# Patient Record
Sex: Male | Born: 1945
Health system: Southern US, Community
[De-identification: ages and names within clinical notes are randomized; demographics above are authoritative.]

## PROBLEM LIST (undated history)

## (undated) ENCOUNTER — Emergency Department (HOSPITAL_COMMUNITY): Discharge status: Absent without leave | Payer: Medicare Other

## (undated) DIAGNOSIS — M545 Low back pain, unspecified: Secondary | ICD-10-CM

## (undated) DIAGNOSIS — J45909 Unspecified asthma, uncomplicated: Secondary | ICD-10-CM

## (undated) DIAGNOSIS — G4730 Sleep apnea, unspecified: Secondary | ICD-10-CM

## (undated) DIAGNOSIS — Z720 Tobacco use: Secondary | ICD-10-CM

## (undated) DIAGNOSIS — I214 Non-ST elevation (NSTEMI) myocardial infarction: Secondary | ICD-10-CM

## (undated) DIAGNOSIS — Z9889 Other specified postprocedural states: Secondary | ICD-10-CM

## (undated) DIAGNOSIS — K5190 Ulcerative colitis, unspecified, without complications: Secondary | ICD-10-CM

## (undated) DIAGNOSIS — G459 Transient cerebral ischemic attack, unspecified: Secondary | ICD-10-CM

## (undated) DIAGNOSIS — E7800 Pure hypercholesterolemia, unspecified: Secondary | ICD-10-CM

## (undated) DIAGNOSIS — J449 Chronic obstructive pulmonary disease, unspecified: Secondary | ICD-10-CM

## (undated) DIAGNOSIS — K219 Gastro-esophageal reflux disease without esophagitis: Secondary | ICD-10-CM

## (undated) DIAGNOSIS — I639 Cerebral infarction, unspecified: Secondary | ICD-10-CM

## (undated) DIAGNOSIS — I5032 Chronic diastolic (congestive) heart failure: Secondary | ICD-10-CM

## (undated) DIAGNOSIS — G8929 Other chronic pain: Secondary | ICD-10-CM

## (undated) DIAGNOSIS — F329 Major depressive disorder, single episode, unspecified: Secondary | ICD-10-CM

## (undated) DIAGNOSIS — F32A Depression, unspecified: Secondary | ICD-10-CM

## (undated) DIAGNOSIS — I10 Essential (primary) hypertension: Secondary | ICD-10-CM

## (undated) DIAGNOSIS — F419 Anxiety disorder, unspecified: Secondary | ICD-10-CM

## (undated) DIAGNOSIS — I2510 Atherosclerotic heart disease of native coronary artery without angina pectoris: Secondary | ICD-10-CM

## (undated) DIAGNOSIS — E119 Type 2 diabetes mellitus without complications: Secondary | ICD-10-CM

## (undated) DIAGNOSIS — I480 Paroxysmal atrial fibrillation: Secondary | ICD-10-CM

## (undated) HISTORY — PX: CHOLECYSTECTOMY: SHX55

## (undated) HISTORY — DX: Chronic diastolic (congestive) heart failure: I50.32

## (undated) HISTORY — PX: CARDIAC CATHETERIZATION: SHX172

## (undated) HISTORY — DX: Other specified postprocedural states: Z98.890

## (undated) HISTORY — DX: Paroxysmal atrial fibrillation: I48.0

## (undated) HISTORY — PX: APPENDECTOMY: SHX54

## (undated) HISTORY — PX: TUMOR EXCISION: SHX421

## (undated) HISTORY — DX: Tobacco use: Z72.0

---
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
## 1998-01-09 ENCOUNTER — Inpatient Hospital Stay (HOSPITAL_COMMUNITY): Admission: AD | Admit: 1998-01-09 | Discharge: 1998-01-10 | Payer: Self-pay | Admitting: Cardiology

## 2001-05-28 ENCOUNTER — Encounter: Admission: RE | Admit: 2001-05-28 | Discharge: 2001-05-28 | Payer: Self-pay | Admitting: Neurosurgery

## 2001-05-28 ENCOUNTER — Encounter: Payer: Self-pay | Admitting: Neurosurgery

## 2002-12-08 ENCOUNTER — Encounter
Admission: RE | Admit: 2002-12-08 | Discharge: 2003-03-08 | Payer: Self-pay | Admitting: Physical Medicine & Rehabilitation

## 2003-01-02 ENCOUNTER — Encounter: Payer: Self-pay | Admitting: Physical Medicine & Rehabilitation

## 2003-01-02 ENCOUNTER — Encounter
Admission: RE | Admit: 2003-01-02 | Discharge: 2003-01-02 | Payer: Self-pay | Admitting: Physical Medicine & Rehabilitation

## 2004-04-15 ENCOUNTER — Ambulatory Visit: Payer: Self-pay | Admitting: Urgent Care

## 2005-02-26 ENCOUNTER — Ambulatory Visit: Payer: Self-pay | Admitting: Internal Medicine

## 2005-03-14 ENCOUNTER — Ambulatory Visit: Payer: Self-pay | Admitting: Internal Medicine

## 2005-03-14 ENCOUNTER — Ambulatory Visit (HOSPITAL_COMMUNITY): Admission: RE | Admit: 2005-03-14 | Discharge: 2005-03-14 | Payer: Self-pay | Admitting: Internal Medicine

## 2005-04-16 ENCOUNTER — Ambulatory Visit: Payer: Self-pay | Admitting: Cardiology

## 2009-06-17 ENCOUNTER — Ambulatory Visit: Payer: Self-pay | Admitting: Cardiology

## 2009-06-19 ENCOUNTER — Encounter: Payer: Self-pay | Admitting: Cardiovascular Disease

## 2011-12-29 DIAGNOSIS — R079 Chest pain, unspecified: Secondary | ICD-10-CM

## 2013-08-25 ENCOUNTER — Encounter (INDEPENDENT_AMBULATORY_CARE_PROVIDER_SITE_OTHER): Payer: Self-pay | Admitting: *Deleted

## 2013-09-15 ENCOUNTER — Ambulatory Visit (INDEPENDENT_AMBULATORY_CARE_PROVIDER_SITE_OTHER): Payer: Medicare Other | Admitting: Internal Medicine

## 2013-10-12 ENCOUNTER — Ambulatory Visit (INDEPENDENT_AMBULATORY_CARE_PROVIDER_SITE_OTHER): Payer: Medicare Other | Admitting: Internal Medicine

## 2014-04-21 ENCOUNTER — Encounter: Payer: Self-pay | Admitting: Cardiovascular Disease

## 2014-05-08 ENCOUNTER — Encounter: Payer: Self-pay | Admitting: Cardiovascular Disease

## 2014-05-09 ENCOUNTER — Encounter: Payer: Self-pay | Admitting: Cardiovascular Disease

## 2014-05-09 DIAGNOSIS — I214 Non-ST elevation (NSTEMI) myocardial infarction: Secondary | ICD-10-CM

## 2014-05-09 HISTORY — PX: CORONARY ANGIOPLASTY WITH STENT PLACEMENT: SHX49

## 2014-05-09 HISTORY — DX: Non-ST elevation (NSTEMI) myocardial infarction: I21.4

## 2014-05-11 DIAGNOSIS — I10 Essential (primary) hypertension: Secondary | ICD-10-CM | POA: Insufficient documentation

## 2014-05-11 DIAGNOSIS — E669 Obesity, unspecified: Secondary | ICD-10-CM | POA: Insufficient documentation

## 2014-05-11 DIAGNOSIS — J410 Simple chronic bronchitis: Secondary | ICD-10-CM | POA: Insufficient documentation

## 2014-05-11 DIAGNOSIS — E1169 Type 2 diabetes mellitus with other specified complication: Secondary | ICD-10-CM | POA: Insufficient documentation

## 2014-06-08 ENCOUNTER — Encounter: Payer: Medicare Other | Admitting: Cardiovascular Disease

## 2014-06-15 ENCOUNTER — Encounter (HOSPITAL_COMMUNITY): Payer: Self-pay

## 2014-06-22 ENCOUNTER — Inpatient Hospital Stay (HOSPITAL_COMMUNITY): Payer: Medicare Other

## 2014-06-22 ENCOUNTER — Emergency Department (HOSPITAL_COMMUNITY): Payer: Medicare Other

## 2014-06-22 ENCOUNTER — Encounter (HOSPITAL_COMMUNITY): Payer: Self-pay | Admitting: Emergency Medicine

## 2014-06-22 ENCOUNTER — Inpatient Hospital Stay (HOSPITAL_COMMUNITY)
Admission: EM | Admit: 2014-06-22 | Discharge: 2014-06-24 | DRG: 871 | Disposition: A | Discharge status: Home / Self Care | Payer: Medicare Other | Attending: Internal Medicine | Admitting: Internal Medicine

## 2014-06-22 DIAGNOSIS — R0789 Other chest pain: Secondary | ICD-10-CM | POA: Diagnosis present

## 2014-06-22 DIAGNOSIS — F419 Anxiety disorder, unspecified: Secondary | ICD-10-CM | POA: Diagnosis present

## 2014-06-22 DIAGNOSIS — J45909 Unspecified asthma, uncomplicated: Secondary | ICD-10-CM | POA: Diagnosis present

## 2014-06-22 DIAGNOSIS — G9340 Encephalopathy, unspecified: Secondary | ICD-10-CM | POA: Diagnosis present

## 2014-06-22 DIAGNOSIS — I2510 Atherosclerotic heart disease of native coronary artery without angina pectoris: Secondary | ICD-10-CM | POA: Diagnosis not present

## 2014-06-22 DIAGNOSIS — Z7902 Long term (current) use of antithrombotics/antiplatelets: Secondary | ICD-10-CM | POA: Diagnosis not present

## 2014-06-22 DIAGNOSIS — J449 Chronic obstructive pulmonary disease, unspecified: Secondary | ICD-10-CM | POA: Diagnosis present

## 2014-06-22 DIAGNOSIS — G8929 Other chronic pain: Secondary | ICD-10-CM | POA: Diagnosis present

## 2014-06-22 DIAGNOSIS — E876 Hypokalemia: Secondary | ICD-10-CM | POA: Diagnosis present

## 2014-06-22 DIAGNOSIS — R05 Cough: Secondary | ICD-10-CM

## 2014-06-22 DIAGNOSIS — R079 Chest pain, unspecified: Secondary | ICD-10-CM | POA: Diagnosis present

## 2014-06-22 DIAGNOSIS — J189 Pneumonia, unspecified organism: Secondary | ICD-10-CM | POA: Diagnosis present

## 2014-06-22 DIAGNOSIS — A419 Sepsis, unspecified organism: Secondary | ICD-10-CM | POA: Diagnosis present

## 2014-06-22 DIAGNOSIS — Z8673 Personal history of transient ischemic attack (TIA), and cerebral infarction without residual deficits: Secondary | ICD-10-CM

## 2014-06-22 DIAGNOSIS — I10 Essential (primary) hypertension: Secondary | ICD-10-CM | POA: Diagnosis present

## 2014-06-22 DIAGNOSIS — E669 Obesity, unspecified: Secondary | ICD-10-CM | POA: Diagnosis present

## 2014-06-22 DIAGNOSIS — E785 Hyperlipidemia, unspecified: Secondary | ICD-10-CM | POA: Diagnosis present

## 2014-06-22 DIAGNOSIS — R059 Cough, unspecified: Secondary | ICD-10-CM

## 2014-06-22 DIAGNOSIS — E119 Type 2 diabetes mellitus without complications: Secondary | ICD-10-CM | POA: Diagnosis present

## 2014-06-22 DIAGNOSIS — I252 Old myocardial infarction: Secondary | ICD-10-CM | POA: Diagnosis not present

## 2014-06-22 DIAGNOSIS — F329 Major depressive disorder, single episode, unspecified: Secondary | ICD-10-CM | POA: Diagnosis present

## 2014-06-22 DIAGNOSIS — Z7982 Long term (current) use of aspirin: Secondary | ICD-10-CM | POA: Diagnosis not present

## 2014-06-22 DIAGNOSIS — M549 Dorsalgia, unspecified: Secondary | ICD-10-CM | POA: Diagnosis present

## 2014-06-22 DIAGNOSIS — M542 Cervicalgia: Secondary | ICD-10-CM

## 2014-06-22 DIAGNOSIS — F17210 Nicotine dependence, cigarettes, uncomplicated: Secondary | ICD-10-CM | POA: Diagnosis present

## 2014-06-22 DIAGNOSIS — Z9049 Acquired absence of other specified parts of digestive tract: Secondary | ICD-10-CM | POA: Diagnosis present

## 2014-06-22 DIAGNOSIS — J441 Chronic obstructive pulmonary disease with (acute) exacerbation: Secondary | ICD-10-CM | POA: Diagnosis present

## 2014-06-22 DIAGNOSIS — D72829 Elevated white blood cell count, unspecified: Secondary | ICD-10-CM | POA: Diagnosis present

## 2014-06-22 DIAGNOSIS — Z720 Tobacco use: Secondary | ICD-10-CM | POA: Diagnosis present

## 2014-06-22 DIAGNOSIS — K5190 Ulcerative colitis, unspecified, without complications: Secondary | ICD-10-CM | POA: Diagnosis present

## 2014-06-22 DIAGNOSIS — K219 Gastro-esophageal reflux disease without esophagitis: Secondary | ICD-10-CM | POA: Diagnosis present

## 2014-06-22 DIAGNOSIS — J410 Simple chronic bronchitis: Secondary | ICD-10-CM

## 2014-06-22 DIAGNOSIS — J439 Emphysema, unspecified: Secondary | ICD-10-CM | POA: Diagnosis present

## 2014-06-22 DIAGNOSIS — R509 Fever, unspecified: Secondary | ICD-10-CM

## 2014-06-22 HISTORY — DX: Gastro-esophageal reflux disease without esophagitis: K21.9

## 2014-06-22 HISTORY — DX: Low back pain: M54.5

## 2014-06-22 HISTORY — DX: Low back pain, unspecified: M54.50

## 2014-06-22 HISTORY — DX: Atherosclerotic heart disease of native coronary artery without angina pectoris: I25.10

## 2014-06-22 HISTORY — DX: Transient cerebral ischemic attack, unspecified: G45.9

## 2014-06-22 HISTORY — DX: Unspecified asthma, uncomplicated: J45.909

## 2014-06-22 HISTORY — DX: Non-ST elevation (NSTEMI) myocardial infarction: I21.4

## 2014-06-22 HISTORY — DX: Ulcerative colitis, unspecified, without complications: K51.90

## 2014-06-22 HISTORY — DX: Chronic obstructive pulmonary disease, unspecified: J44.9

## 2014-06-22 HISTORY — DX: Essential (primary) hypertension: I10

## 2014-06-22 HISTORY — DX: Depression, unspecified: F32.A

## 2014-06-22 HISTORY — DX: Pure hypercholesterolemia, unspecified: E78.00

## 2014-06-22 HISTORY — DX: Anxiety disorder, unspecified: F41.9

## 2014-06-22 HISTORY — DX: Other chronic pain: G89.29

## 2014-06-22 HISTORY — DX: Type 2 diabetes mellitus without complications: E11.9

## 2014-06-22 HISTORY — DX: Major depressive disorder, single episode, unspecified: F32.9

## 2014-06-22 LAB — RAPID URINE DRUG SCREEN, HOSP PERFORMED
Amphetamines: NOT DETECTED
BENZODIAZEPINES: POSITIVE — AB
Barbiturates: NOT DETECTED
COCAINE: NOT DETECTED
Opiates: POSITIVE — AB
Tetrahydrocannabinol: NOT DETECTED

## 2014-06-22 LAB — BASIC METABOLIC PANEL
Anion gap: 9 (ref 5–15)
BUN: 17 mg/dL (ref 6–23)
CO2: 29 mmol/L (ref 19–32)
CREATININE: 1.33 mg/dL (ref 0.50–1.35)
Calcium: 8.5 mg/dL (ref 8.4–10.5)
Chloride: 101 mEq/L (ref 96–112)
GFR, EST AFRICAN AMERICAN: 63 mL/min — AB (ref >90)
GFR, EST NON AFRICAN AMERICAN: 54 mL/min — AB (ref >90)
Glucose, Bld: 120 mg/dL — ABNORMAL HIGH (ref 70–99)
Potassium: 4.4 mmol/L (ref 3.5–5.1)
SODIUM: 139 mmol/L (ref 135–145)

## 2014-06-22 LAB — I-STAT ARTERIAL BLOOD GAS, ED
BICARBONATE: 27.2 meq/L — AB (ref 20.0–24.0)
O2 Saturation: 85 %
Patient temperature: 98.7
TCO2: 29 mmol/L (ref 0–100)
pCO2 arterial: 54.9 mmHg — ABNORMAL HIGH (ref 35.0–45.0)
pH, Arterial: 7.303 — ABNORMAL LOW (ref 7.350–7.450)
pO2, Arterial: 57 mmHg — ABNORMAL LOW (ref 80.0–100.0)

## 2014-06-22 LAB — URINALYSIS, ROUTINE W REFLEX MICROSCOPIC
GLUCOSE, UA: NEGATIVE mg/dL
HGB URINE DIPSTICK: NEGATIVE
KETONES UR: 15 mg/dL — AB
Leukocytes, UA: NEGATIVE
NITRITE: NEGATIVE
PROTEIN: NEGATIVE mg/dL
Specific Gravity, Urine: 1.026 (ref 1.005–1.030)
Urobilinogen, UA: 0.2 mg/dL (ref 0.0–1.0)
pH: 5 (ref 5.0–8.0)

## 2014-06-22 LAB — CBC
HEMATOCRIT: 42.3 % (ref 39.0–52.0)
Hemoglobin: 14.2 g/dL (ref 13.0–17.0)
MCH: 29.2 pg (ref 26.0–34.0)
MCHC: 33.6 g/dL (ref 30.0–36.0)
MCV: 87 fL (ref 78.0–100.0)
Platelets: 283 10*3/uL (ref 150–400)
RBC: 4.86 MIL/uL (ref 4.22–5.81)
RDW: 13.4 % (ref 11.5–15.5)
WBC: 20.6 10*3/uL — ABNORMAL HIGH (ref 4.0–10.5)

## 2014-06-22 LAB — HEPATIC FUNCTION PANEL
ALT: 22 U/L (ref 0–53)
AST: 21 U/L (ref 0–37)
Albumin: 3.8 g/dL (ref 3.5–5.2)
Alkaline Phosphatase: 79 U/L (ref 39–117)
Bilirubin, Direct: <0.1 mg/dL (ref 0.0–0.3)
TOTAL PROTEIN: 7.7 g/dL (ref 6.0–8.3)
Total Bilirubin: 0.7 mg/dL (ref 0.3–1.2)

## 2014-06-22 LAB — BRAIN NATRIURETIC PEPTIDE: B Natriuretic Peptide: 67.2 pg/mL (ref 0.0–100.0)

## 2014-06-22 LAB — TROPONIN I: Troponin I: 0.03 ng/mL (ref <0.031)

## 2014-06-22 LAB — AMMONIA: AMMONIA: 31 umol/L (ref 11–32)

## 2014-06-22 LAB — GLUCOSE, CAPILLARY: Glucose-Capillary: 106 mg/dL — ABNORMAL HIGH (ref 70–99)

## 2014-06-22 MED ORDER — INSULIN ASPART 100 UNIT/ML ~~LOC~~ SOLN
0.0000 [IU] | Freq: Three times a day (TID) | SUBCUTANEOUS | Status: DC
  Administered 2014-06-23: 3 [IU] via SUBCUTANEOUS
  Administered 2014-06-23: 2 [IU] via SUBCUTANEOUS
  Administered 2014-06-23: 3 [IU] via SUBCUTANEOUS

## 2014-06-22 MED ORDER — IPRATROPIUM-ALBUTEROL 0.5-2.5 (3) MG/3ML IN SOLN
3.0000 mL | Freq: Once | RESPIRATORY_TRACT | Status: AC
  Administered 2014-06-22: 3 mL via RESPIRATORY_TRACT
  Filled 2014-06-22: qty 3

## 2014-06-22 MED ORDER — PIPERACILLIN-TAZOBACTAM 3.375 G IVPB
3.3750 g | Freq: Three times a day (TID) | INTRAVENOUS | Status: DC
  Filled 2014-06-22 (×2): qty 50

## 2014-06-22 MED ORDER — CLOPIDOGREL BISULFATE 75 MG PO TABS
75.0000 mg | ORAL_TABLET | Freq: Every day | ORAL | Status: DC
  Administered 2014-06-23 – 2014-06-24 (×2): 75 mg via ORAL
  Filled 2014-06-22 (×2): qty 1

## 2014-06-22 MED ORDER — VANCOMYCIN HCL 10 G IV SOLR
1500.0000 mg | Freq: Once | INTRAVENOUS | Status: AC
  Administered 2014-06-23: 1500 mg via INTRAVENOUS
  Filled 2014-06-22: qty 1500

## 2014-06-22 MED ORDER — SODIUM CHLORIDE 0.9 % IJ SOLN
3.0000 mL | Freq: Two times a day (BID) | INTRAMUSCULAR | Status: DC
  Administered 2014-06-22 – 2014-06-23 (×2): 3 mL via INTRAVENOUS

## 2014-06-22 MED ORDER — ONDANSETRON HCL 4 MG PO TABS: 4.0000 mg | ORAL_TABLET | Freq: Four times a day (QID) | ORAL | Status: DC | PRN

## 2014-06-22 MED ORDER — ACETAMINOPHEN 325 MG PO TABS: 650.0000 mg | ORAL_TABLET | Freq: Four times a day (QID) | ORAL | Status: DC | PRN

## 2014-06-22 MED ORDER — ENOXAPARIN SODIUM 40 MG/0.4ML ~~LOC~~ SOLN
40.0000 mg | SUBCUTANEOUS | Status: DC
  Administered 2014-06-22 – 2014-06-23 (×2): 40 mg via SUBCUTANEOUS
  Filled 2014-06-22 (×2): qty 0.4

## 2014-06-22 MED ORDER — ONDANSETRON HCL 4 MG/2ML IJ SOLN: 4.0000 mg | Freq: Four times a day (QID) | INTRAMUSCULAR | Status: DC | PRN

## 2014-06-22 MED ORDER — ALBUTEROL SULFATE HFA 108 (90 BASE) MCG/ACT IN AERS: 2.0000 | INHALATION_SPRAY | Freq: Four times a day (QID) | RESPIRATORY_TRACT | Status: DC | PRN

## 2014-06-22 MED ORDER — LORAZEPAM 0.5 MG PO TABS
0.5000 mg | ORAL_TABLET | Freq: Once | ORAL | Status: AC
  Administered 2014-06-23: 0.5 mg via ORAL
  Filled 2014-06-22: qty 1

## 2014-06-22 MED ORDER — ISOSORBIDE MONONITRATE ER 30 MG PO TB24
30.0000 mg | ORAL_TABLET | Freq: Every day | ORAL | Status: DC
  Administered 2014-06-23 – 2014-06-24 (×2): 30 mg via ORAL
  Filled 2014-06-22 (×2): qty 1

## 2014-06-22 MED ORDER — PANTOPRAZOLE SODIUM 40 MG PO TBEC
40.0000 mg | DELAYED_RELEASE_TABLET | Freq: Every day | ORAL | Status: DC
  Administered 2014-06-23 – 2014-06-24 (×2): 40 mg via ORAL
  Filled 2014-06-22 (×2): qty 1

## 2014-06-22 MED ORDER — PIPERACILLIN-TAZOBACTAM 3.375 G IVPB 30 MIN
3.3750 g | Freq: Once | INTRAVENOUS | Status: AC
  Administered 2014-06-22: 3.375 g via INTRAVENOUS
  Filled 2014-06-22: qty 50

## 2014-06-22 MED ORDER — ACETAMINOPHEN 650 MG RE SUPP: 650.0000 mg | Freq: Four times a day (QID) | RECTAL | Status: DC | PRN

## 2014-06-22 MED ORDER — VANCOMYCIN HCL IN DEXTROSE 750-5 MG/150ML-% IV SOLN
750.0000 mg | Freq: Two times a day (BID) | INTRAVENOUS | Status: DC
  Administered 2014-06-23: 750 mg via INTRAVENOUS
  Filled 2014-06-22 (×3): qty 150

## 2014-06-22 MED ORDER — PRAVASTATIN SODIUM 20 MG PO TABS: 20.0000 mg | ORAL_TABLET | Freq: Every day | ORAL | Status: DC

## 2014-06-22 MED ORDER — CARVEDILOL 25 MG PO TABS
25.0000 mg | ORAL_TABLET | Freq: Two times a day (BID) | ORAL | Status: DC
  Administered 2014-06-22 – 2014-06-24 (×4): 25 mg via ORAL
  Filled 2014-06-22 (×4): qty 1

## 2014-06-22 MED ORDER — ALBUTEROL SULFATE (2.5 MG/3ML) 0.083% IN NEBU: 2.5000 mg | INHALATION_SOLUTION | Freq: Four times a day (QID) | RESPIRATORY_TRACT | Status: DC | PRN

## 2014-06-22 MED ORDER — BENZONATATE 100 MG PO CAPS: 100.0000 mg | ORAL_CAPSULE | Freq: Three times a day (TID) | ORAL | Status: DC | PRN

## 2014-06-22 MED ORDER — NITROGLYCERIN 0.4 MG SL SUBL: 0.4000 mg | SUBLINGUAL_TABLET | SUBLINGUAL | Status: DC | PRN

## 2014-06-22 MED ORDER — AMLODIPINE BESYLATE 10 MG PO TABS: 10.0000 mg | ORAL_TABLET | Freq: Every day | ORAL | Status: DC

## 2014-06-22 MED ORDER — PIPERACILLIN-TAZOBACTAM 3.375 G IVPB
3.3750 g | Freq: Three times a day (TID) | INTRAVENOUS | Status: DC
  Administered 2014-06-23 (×2): 3.375 g via INTRAVENOUS
  Filled 2014-06-22 (×5): qty 50

## 2014-06-22 MED ORDER — NICOTINE 21 MG/24HR TD PT24
21.0000 mg | MEDICATED_PATCH | Freq: Every day | TRANSDERMAL | Status: DC
  Administered 2014-06-23: 21 mg via TRANSDERMAL
  Filled 2014-06-22 (×3): qty 1

## 2014-06-22 MED ORDER — INSULIN ASPART 100 UNIT/ML ~~LOC~~ SOLN: 0.0000 [IU] | Freq: Every day | SUBCUTANEOUS | Status: DC

## 2014-06-22 MED ORDER — SULFASALAZINE 500 MG PO TABS
500.0000 mg | ORAL_TABLET | Freq: Three times a day (TID) | ORAL | Status: DC
  Administered 2014-06-22 – 2014-06-23 (×4): 500 mg via ORAL
  Filled 2014-06-22 (×7): qty 1

## 2014-06-22 MED ORDER — POTASSIUM CHLORIDE ER 10 MEQ PO TBCR
10.0000 meq | EXTENDED_RELEASE_TABLET | Freq: Every day | ORAL | Status: DC
  Administered 2014-06-23 – 2014-06-24 (×2): 10 meq via ORAL
  Filled 2014-06-22 (×4): qty 1

## 2014-06-22 NOTE — Progress Notes (Signed)
ANTIBIOTIC CONSULT NOTE - INITIAL  Pharmacy Consult for Vancomycin and Zosyn Indication: rule out sepsis  No Known Allergies  Patient Measurements: Height: 5\' 9"  (175.3 cm) Weight: 210 lb (95.255 kg) IBW/kg (Calculated) : 70.7  Vital Signs: BP: 108/55 mmHg (01/14 1845) Pulse Rate: 64 (01/14 1845) Intake/Output from previous day:   Intake/Output from this shift:    Labs:  Recent Labs  06/22/14 1520  WBC 20.6*  HGB 14.2  PLT 283  CREATININE 1.33   Estimated Creatinine Clearance: 62.2 mL/min (by C-G formula based on Cr of 1.33). No results for input(s): VANCOTROUGH, VANCOPEAK, VANCORANDOM, GENTTROUGH, GENTPEAK, GENTRANDOM, TOBRATROUGH, TOBRAPEAK, TOBRARND, AMIKACINPEAK, AMIKACINTROU, AMIKACIN in the last 72 hours.   Microbiology: No results found for this or any previous visit (from the past 720 hour(s)).  Medical History: Past Medical History  Diagnosis Date  . Myocardial infarction     with stent placement  . Ulcerative colitis   . Diabetes mellitus without complication   . Hypertension   . Asthma   . COPD (chronic obstructive pulmonary disease)    Assessment: 6 YOM presenting to [facility] on 06/22/14 c/o CP, hypotension, and dizziness.  Patient also has stiffness in the neck x 3 days and tightness of the chest.  CXR reveals edema.  Pharmacy has been consulted to dose Vancomycin and Zosyn in the setting to rule out sepsis.    WBC up 20.6, afebrile, HR 63, RR 18.   Scr 1.33, Crcl ~ 55-60 mL/min, NKDA.    Goal of Therapy:  Vancomycin trough level 15-20 mcg/ml  Plan:  - Vancomycin 1500 IV x 1 - Vancomycin 750 mg IV Q12H - Zosyn 3.375 g IV x 1 (over 30 min.) - Zosyn 3.375 g IV Q8H - Monitor for clinical efficacy and trough levels when appropriate - F/U cultures  Hassie Bruce, Pharm. D. Clinical Pharmacy Resident Pager: [phone] Ph: [phone] 06/22/2014 7:24 PM

## 2014-06-22 NOTE — ED Provider Notes (Signed)
69 year old male status post coronary stenting has been having neck pain for last 3 days. Today, he had some chest discomfort and sweating and also noted that his vision was blurred and he was dizzy. Most of the symptoms have resolved except for the neck discomfort. On exam, pupils are equal and reactive and fundi show no hemorrhage, exudate, or papilledema. Neck has some muscle spasm and is mildly tender. Lungs are clear and heart has regular rate and rhythm. There are no carotid bruits. Chest images have 2+ edema. Speech is slow and thought processes are scattered in the family states that this is not his baseline. He will likely need evaluation for possible transient ischemic attack.  I saw and evaluated the patient, reviewed the resident's note and I agree with the findings and plan.   EKG Interpretation   Date/Time:  Thursday June 22 2014 14:54:24 EST Ventricular Rate:  73 PR Interval:  160 QRS Duration: 98 QT Interval:  409 QTC Calculation: 451 R Axis:   82 Text Interpretation:  Sinus rhythm Borderline right axis deviation  Probable LVH with secondary repol abnrm No old tracing to compare  Confirmed by [facility]  MD, Sarah Zerby ([id number]) on 06/22/2014 3:40:12 PM        Delora Fuel, MD 72/25/75 0518

## 2014-06-22 NOTE — ED Provider Notes (Signed)
CSN: [id number]     Arrival date & time 06/22/14  1439 History   First MD Initiated Contact with Patient 06/22/14 1457     Chief Complaint  Patient presents with  . Chest Pain  . Dizziness     (Consider location/radiation/quality/duration/timing/severity/associated sxs/prior Treatment) Patient is a 69 y.o. male presenting with chest pain and dizziness. The history is provided by the patient. No language interpreter was used.  Chest Pain Pain location:  Substernal area Pain quality: pressure   Pain radiates to:  Does not radiate Pain radiates to the back: no   Pain severity:  Moderate Onset quality:  Gradual Duration:  2 days Timing:  Constant Progression:  Unchanged Chronicity:  New Context: not breathing and not lifting   Relieved by:  Nothing Worsened by:  Nothing tried Ineffective treatments:  None tried Associated symptoms: cough, dizziness and shortness of breath   Associated symptoms: no abdominal pain, no anorexia, no back pain, no fever, no nausea, no orthopnea, not vomiting and no weakness   Risk factors: coronary artery disease and male sex   Risk factors: no aortic disease and no birth control   Dizziness Associated symptoms: chest pain and shortness of breath   Associated symptoms: no nausea and no vomiting      69 y/o Caucasian male with PMH of COPD, DM, and MI with stents placed at [facility] 1 month ago here with cough, shortness of breath, and chest pressure for the past couple of weeks.  Patient's main complaint is lethargy and shortness of breath.  He has felt this way for approximately two weeks.  He reports a non-productive cough which has become progressively worse with worsening shortness of breath.  Today he felt so tired and lethargic that he was unable to get out of bed.  He denies fevers, but has been experiencing chills.  Patient also reports precordial chest pressure similar to his previous MI which has been present for the past 2 days and getting  progressively worse.  He cannot think of anything that improves or worsens his chest pressure.  He rates it at a 4/10.    Past Medical History  Diagnosis Date  . Myocardial infarction     with stent placement  . Ulcerative colitis   . Hypertension   . Asthma   . COPD (chronic obstructive pulmonary disease)   . GERD (gastroesophageal reflux disease)   . Type II diabetes mellitus   . Coronary artery disease    Past Surgical History  Procedure Laterality Date  . Coronary angioplasty with stent placement      "2"  . Appendectomy    . Cholecystectomy    . Tumor excision Right ~ 1999    "side of my upper head"   No family history on file. History  Substance Use Topics  . Smoking status: Current Every Day Smoker -- 1.50 packs/day    Types: Cigarettes  . Smokeless tobacco: Not on file  . Alcohol Use: No    Review of Systems  Constitutional: Negative for fever.  Respiratory: Positive for cough and shortness of breath.   Cardiovascular: Positive for chest pain. Negative for orthopnea.  Gastrointestinal: Negative for nausea, vomiting, abdominal pain and anorexia.  Musculoskeletal: Negative for back pain.  Neurological: Positive for dizziness. Negative for weakness.  All other systems reviewed and are negative.     Allergies  Review of patient's allergies indicates no known allergies.  Home Medications   Prior to Admission medications   Medication Sig  Start Date End Date Taking? Authorizing Provider  albuterol (PROVENTIL HFA;VENTOLIN HFA) 108 (90 BASE) MCG/ACT inhaler Inhale 2 puffs into the lungs every 6 (six) hours as needed for wheezing or shortness of breath.   Yes Historical Provider, MD  amLODipine (NORVASC) 10 MG tablet Take 10 mg by mouth daily.   Yes Historical Provider, MD  benzonatate (TESSALON) 100 MG capsule Take 100 mg by mouth 3 (three) times daily as needed for cough.   Yes Historical Provider, MD  carvedilol (COREG) 25 MG tablet Take 25 mg by mouth 2 (two)  times daily with a meal.   Yes Historical Provider, MD  clopidogrel (PLAVIX) 75 MG tablet Take 75 mg by mouth daily.   Yes Historical Provider, MD  glipiZIDE (GLUCOTROL XL) 2.5 MG 24 hr tablet Take 2.5 mg by mouth daily with breakfast.   Yes Historical Provider, MD  hydrochlorothiazide (HYDRODIURIL) 25 MG tablet Take 25 mg by mouth daily.   Yes Historical Provider, MD  isosorbide mononitrate (IMDUR) 30 MG 24 hr tablet Take 30 mg by mouth daily.   Yes Historical Provider, MD  lisinopril (PRINIVIL,ZESTRIL) 20 MG tablet Take 20 mg by mouth daily.   Yes Historical Provider, MD  lovastatin (MEVACOR) 20 MG tablet Take 20 mg by mouth at bedtime.   Yes Historical Provider, MD  metFORMIN (GLUCOPHAGE-XR) 500 MG 24 hr tablet Take 1,000 mg by mouth daily with breakfast.   Yes Historical Provider, MD  nitroGLYCERIN (NITROSTAT) 0.4 MG SL tablet Place 0.4 mg under the tongue every 5 (five) minutes as needed for chest pain.   Yes Historical Provider, MD  pantoprazole (PROTONIX) 40 MG tablet Take 40 mg by mouth daily.   Yes Historical Provider, MD  potassium chloride (K-DUR) 10 MEQ tablet Take 10 mEq by mouth daily.   Yes Historical Provider, MD  pregabalin (LYRICA) 225 MG capsule Take 225 mg by mouth 2 (two) times daily.   Yes Historical Provider, MD  sulfaSALAzine (AZULFIDINE) 500 MG tablet Take 500 mg by mouth 3 (three) times daily.   Yes Historical Provider, MD   BP 123/63 mmHg  Pulse 72  Temp(Src) 98.3 F (36.8 C) (Oral)  Resp 21  Ht 5\' 9"  (1.753 m)  Wt 205 lb 12.8 oz (93.35 kg)  BMI 30.38 kg/m2  SpO2 94% Physical Exam  Constitutional: He is oriented to person, place, and time. He appears well-developed and well-nourished. He appears lethargic. He is easily aroused. He appears ill. No distress.  HENT:  Head: Normocephalic and atraumatic.  Eyes: Pupils are equal, round, and reactive to light.  Neck: Normal range of motion.  Cardiovascular: Normal rate, regular rhythm and normal heart sounds.    Pulses:      Radial pulses are 2+ on the right side, and 2+ on the left side.  Pulmonary/Chest: Effort normal. No accessory muscle usage. No tachypnea and no bradypnea. No respiratory distress. He has no decreased breath sounds. He has wheezes in the right upper field, the right middle field, the right lower field, the left upper field, the left middle field and the left lower field. He has no rhonchi. He has no rales.  Abdominal: Soft. He exhibits no distension. There is no tenderness. There is no rebound and no guarding.  Musculoskeletal: He exhibits no edema or tenderness.  Neurological: He is oriented to person, place, and time and easily aroused. He appears lethargic. No cranial nerve deficit or sensory deficit. He exhibits normal muscle tone.  Strength 5/5 bilateral upper and lower extremities.  Sensation intact x4 extremities.  CN II-XII intact.    Skin: Skin is warm and dry.  Nursing note and vitals reviewed.   ED Course  Procedures (including critical care time) Labs Review Labs Reviewed  BASIC METABOLIC PANEL - Abnormal; Notable for the following:    Glucose, Bld 120 (*)    GFR calc non Af Amer 54 (*)    GFR calc Af Amer 63 (*)    All other components within normal limits  CBC - Abnormal; Notable for the following:    WBC 20.6 (*)    All other components within normal limits  URINALYSIS, ROUTINE W REFLEX MICROSCOPIC - Abnormal; Notable for the following:    Color, Urine AMBER (*)    APPearance HAZY (*)    Bilirubin Urine SMALL (*)    Ketones, ur 15 (*)    All other components within normal limits  URINE RAPID DRUG SCREEN (HOSP PERFORMED) - Abnormal; Notable for the following:    Opiates POSITIVE (*)    Benzodiazepines POSITIVE (*)    All other components within normal limits  I-STAT ARTERIAL BLOOD GAS, ED - Abnormal; Notable for the following:    pH, Arterial 7.303 (*)    pCO2 arterial 54.9 (*)    pO2, Arterial 57.0 (*)    Bicarbonate 27.2 (*)    All other components  within normal limits  CULTURE, BLOOD (ROUTINE X 2)  CULTURE, BLOOD (ROUTINE X 2)  URINE CULTURE  TROPONIN I  BRAIN NATRIURETIC PEPTIDE  TSH  VITAMIN B12  AMMONIA  HEPATIC FUNCTION PANEL  COMPREHENSIVE METABOLIC PANEL  CBC WITH DIFFERENTIAL  TROPONIN I  TROPONIN I  TROPONIN I    Imaging Review Dg Chest 2 View  06/22/2014   CLINICAL DATA:  Chest pain for 3 days.  Hypotension.  Dizziness.  EXAM: CHEST  2 VIEW  COMPARISON:  05/09/2014.  FINDINGS: Cardiopericardial silhouette is within normal limits for projection. Emphysema is present. There is flattening of the hemidiaphragms and enlargement of the retrosternal clear space. The costophrenic angles are excluded from view on the lateral. Aortic arch atherosclerosis. No airspace disease or pleural effusion is evident. No pneumothorax.  IMPRESSION: Emphysema without active cardiopulmonary disease.   Electronically Signed   By: Dereck Ligas M.D.   On: 06/22/2014 16:05     EKG Interpretation   Date/Time:  Thursday June 22 2014 14:54:24 EST Ventricular Rate:  73 PR Interval:  160 QRS Duration: 98 QT Interval:  409 QTC Calculation: 451 R Axis:   82 Text Interpretation:  Sinus rhythm Borderline right axis deviation  Probable LVH with secondary repol abnrm No old tracing to compare  Confirmed by [facility]  MD, DAVID ([id number]) on 06/22/2014 3:40:12 PM      MDM   Final diagnoses:  Acute encephalopathy  Neck pain  Chest Pain Leukocytosis  69 y/o M with PMH of CAD with stents x2 a month ago here with precordial chest pressure, lethargy, and neck discomfort.  Patient has not had any fevers or chills.  He has had a non-productive cough for the past several weeks and shortness of breath.  Initial differential is concerning for MI, ACS, COPD exacerbation, and PNA.  With no neck stiffness, no fevers, no chills, doubt a meningitis.  Initial work-up included ABC, BMP, CBC, troponin, EKG, and CXR.  EKG as detailed above with no ST segment  elevations.  CXR with emphysema, but no consolidations.  Patient's initial exam with significant wheezing.  Treated with a duoneb with moderate  improvement in his symptoms.  With no pleuritic pain, improvement in shortness of breath with duoneb, and no history of DVT/PE I doubt PE.  Initial troponin negative.  WBC 20.6.  With no productive cough, no fevers, no chills, and normal CXR doubt PNA.  ABG with pH of 7.303 and pCO2 of 54.9.  Patient felt to require admission to the hospital for an ACS r/o and observation overnight to evaluate for his elevated WBC.  Patient with complete resolution of all of his symptoms at this time.  Patient likely stable for the floor.  Patient admitted to the hospitalist service in a good condition.  Labs and imaging reviewed by myself and considered in medical decision making.  Imaging interpreted by Radiology.  Care discussed with my attending Dr. Roxanne Mins.     Katheren Shams, MD 02/72/53 6644  Delora Fuel, MD 03/47/42 5956

## 2014-06-22 NOTE — ED Notes (Signed)
Attempted report x1. 

## 2014-06-22 NOTE — H&P (Addendum)
[facility] History and Physical  Ronald [name] CHE:[id number] DOB: 1945-10-18 DOA: 06/22/2014  Referring physician: Dr Roxanne Mins PCP: Celedonio Savage, MD   Chief Complaint:  Chest pain since one day Neck pain for 3 days  HPI:  69 year old obese male with history of type 2 diabetes mellitus (A1c of 6.9), COPD, hypertension, GERD, recent hospitalization at [facility] for an NST EMI and underwent cardiac cath with 2 DES placed to LAD for significant mid LAD stenosis. (He was discharged on aspirin and Plavix,), ongoing heavy tobacco use, history of TIA and ulcerative colitis who presented to the ED for substernal chest pain since his morning. Patient reports having posterior neck pain for the past 3 days with subjective chills and sweating. He reports sharp substernal chest pain that radiated to his neck without any aggravating or relieving factors. Today he felt very weak and needed help to get out of bed. He denies passing out, bowel or urinary incontinence. Also reports nonproductive cough and some shortness of breath. He denies any dizziness or lightheadedness. Denies any weakness or numbness. He reports feeling increasingly sleepy for the past 3 days. Appears sleepy during conversation and is a poor historian. Patient denies any headache or blurred vision. Denies dizziness or fever but reports chills. Denies nausea, vomiting, palpitations, shortness of breath, abdominal pain, bowel or urinary symptoms. . Denies change in weight or appetite.  Course in the ED Patient's blood pressure was low at 95/ 46 mmhg. He was afebrile appeared lethargic. Blood work done showed significant leukocytosis with WBC of 20.6 K. Normal hemoglobin and hematocrit. Chemistry was unremarkable. Creatinine 1.33. Blood glucose was 120. Initial troponin was negative. Chest x-ray was unremarkable for any infiltrate. ABG done showed pH of 7.30, PCO2 of 54.9 and PO2 of 57. EKG shows normal sinus rhythm with PVCs and  T-wave inversion in lateral leads. Patient denied any further chest pain in the ED. Hospitalist admission requested to telemetry.  Review of Systems:  Constitutional: Chills, diaphoresis, fatigue, denies fever or appetite change  HEENT: eye redness, neck pain, Denies photophobia, eye pain, tinnitus, neck stiffness, Respiratory: Denies SOB, DOE, cough, chest tightness,  and wheezing.   Cardiovascular: Dchest pain, denies palpitations and leg swelling.  Gastrointestinal: Denies nausea, vomiting, abdominal pain, diarrhea, blood in stool and abdominal distention.  Genitourinary: Denies dysuria, urgency, frequency, hematuria, flank pain and difficulty urinating.  Endocrine: Denies: hot or cold intolerance,  polyuria, polydipsia. Musculoskeletal: Denies myalgias, back pain,  Joint pain Skin: Denies  rash and wound.  Neurological: Weakness,  Denies dizziness, seizures, syncope,  light-headedness, numbness and headaches.  Hematological: Denies adenopathy. Psychiatric/Behavioral: confusion+   Past Medical History  Diagnosis Date  . Myocardial infarction     with stent placement  . Ulcerative colitis   . Diabetes mellitus without complication   . Hypertension   . Asthma   . COPD (chronic obstructive pulmonary disease)    Past Surgical History  Procedure Laterality Date  . Cardiac surgery     Social History:  reports that he has been smoking Cigarettes.  He has been smoking about 1.50 packs per day. He does not have any smokeless tobacco history on file. He reports that he does not drink alcohol or use illicit drugs.  No Known Allergies  Family history Denies hx of heart disease of diabetes in family   Prior to Admission medications   Medication Sig Start Date End Date Taking? Authorizing Provider  albuterol (PROVENTIL HFA;VENTOLIN HFA) 108 (90 BASE) MCG/ACT inhaler Inhale 2  puffs into the lungs every 6 (six) hours as needed for wheezing or shortness of breath.   Yes Historical  Provider, MD  amLODipine (NORVASC) 10 MG tablet Take 10 mg by mouth daily.   Yes Historical Provider, MD  benzonatate (TESSALON) 100 MG capsule Take 100 mg by mouth 3 (three) times daily as needed for cough.   Yes Historical Provider, MD  carvedilol (COREG) 25 MG tablet Take 25 mg by mouth 2 (two) times daily with a meal.   Yes Historical Provider, MD  clopidogrel (PLAVIX) 75 MG tablet Take 75 mg by mouth daily.   Yes Historical Provider, MD  glipiZIDE (GLUCOTROL XL) 2.5 MG 24 hr tablet Take 2.5 mg by mouth daily with breakfast.   Yes Historical Provider, MD  hydrochlorothiazide (HYDRODIURIL) 25 MG tablet Take 25 mg by mouth daily.   Yes Historical Provider, MD  isosorbide mononitrate (IMDUR) 30 MG 24 hr tablet Take 30 mg by mouth daily.   Yes Historical Provider, MD  lisinopril (PRINIVIL,ZESTRIL) 20 MG tablet Take 20 mg by mouth daily.   Yes Historical Provider, MD  lovastatin (MEVACOR) 20 MG tablet Take 20 mg by mouth at bedtime.   Yes Historical Provider, MD  metFORMIN (GLUCOPHAGE-XR) 500 MG 24 hr tablet Take 1,000 mg by mouth daily with breakfast.   Yes Historical Provider, MD  nitroGLYCERIN (NITROSTAT) 0.4 MG SL tablet Place 0.4 mg under the tongue every 5 (five) minutes as needed for chest pain.   Yes Historical Provider, MD  pantoprazole (PROTONIX) 40 MG tablet Take 40 mg by mouth daily.   Yes Historical Provider, MD  potassium chloride (K-DUR) 10 MEQ tablet Take 10 mEq by mouth daily.   Yes Historical Provider, MD  pregabalin (LYRICA) 225 MG capsule Take 225 mg by mouth 2 (two) times daily.   Yes Historical Provider, MD  sulfaSALAzine (AZULFIDINE) 500 MG tablet Take 500 mg by mouth 3 (three) times daily.   Yes Historical Provider, MD     Physical Exam:  Filed Vitals:   06/22/14 1739 06/22/14 1815 06/22/14 1816 06/22/14 1845  BP: 99/57 123/71 99/54 108/55  Pulse: 68 73 77 64  Resp: 18 18 19 20   Height:      Weight:      SpO2: 93% 97% 96% 98%    Constitutional: Vital signs  reviewed.  Elderly obese male lying in bed appears sleepy but arousable HEENT: Conjunctival congestion,, no icterus, moist oral mucosa, no cervical lymphadenopathy, neck supple without rigidity but tender to pressure posteriorly Cardiovascular: RRR, S1 normal, S2 normal, no MRG Chest: CTAB, no wheezes, rales, or rhonchi Abdominal: Soft. Non-tender, non-distended, bowel sounds are normal, GU: no CVA tenderness Ext: warm, no edema Neurological: Alert and oriented but has some confusion.( especially with orientation to place) , cranial nerves  intact, normal strength bilaterally, mild flapping tremors, normal sensations are downgoing bilaterally, no neck rigidity  Labs on Admission:  Basic Metabolic Panel:  Recent Labs Lab 06/22/14 1520  NA 139  K 4.4  CL 101  CO2 29  GLUCOSE 120*  BUN 17  CREATININE 1.33  CALCIUM 8.5   Liver Function Tests: No results for input(s): AST, ALT, ALKPHOS, BILITOT, PROT, ALBUMIN in the last 168 hours. No results for input(s): LIPASE, AMYLASE in the last 168 hours. No results for input(s): AMMONIA in the last 168 hours. CBC:  Recent Labs Lab 06/22/14 1520  WBC 20.6*  HGB 14.2  HCT 42.3  MCV 87.0  PLT 283   Cardiac Enzymes:  Recent  Labs Lab 06/22/14 1520  TROPONINI 0.03   BNP: Invalid input(s): POCBNP CBG: No results for input(s): GLUCAP in the last 168 hours.  Radiological Exams on Admission: Dg Chest 2 View  06/22/2014   CLINICAL DATA:  Chest pain for 3 days.  Hypotension.  Dizziness.  EXAM: CHEST  2 VIEW  COMPARISON:  05/09/2014.  FINDINGS: Cardiopericardial silhouette is within normal limits for projection. Emphysema is present. There is flattening of the hemidiaphragms and enlargement of the retrosternal clear space. The costophrenic angles are excluded from view on the lateral. Aortic arch atherosclerosis. No airspace disease or pleural effusion is evident. No pneumothorax.  IMPRESSION: Emphysema without active cardiopulmonary  disease.   Electronically Signed   By: Dereck Ligas M.D.   On: 06/22/2014 16:05    EKG: NSR with PACs, LVHa nd TWI in lateral leads  Assessment/Plan Active Problems:   Chest pain at rest History is inconsistent. Given recent NST EMI will admit to telemetry to rule out ACS. Cycle serial troponins and EKGs. Recent 2-D echo done at [facility] showing normal EF of 55% with no wall motion abnormality. PA pressure of 43. continue  aspirin, Plavix, statin and sublingual nitrate. Continue carvedilol.    CAD with recent NST EMI Status post cardiac cath with 2 DES in LAD for mid LAD stenosis. continue  dual antiplatelet therapy with aspirin and Plavix. Continue beta blocker and statin.  Acute encephalopathy Infectious versus metabolic. Has significant leukocytosis and history of chills and diaphoresis. No meningeal signs.. Chest x-ray unremarkable. Check UA, urine drug screen, blood cultures, head CT and CT cervical spine with contrast. Reports history of TIA. -Check TSH and B12 level. Check ammonia level,  HIV antibody. -Neurochecks every 4 hours. -PT eval.  Leukocytosis No clear signs of underlying infection. And diaphoresis at home. Check UA, urine culture and blood culture. Check CT cervical spine with contrast. Will place on empiric vancomycin and zosyn.   hypertension Blood pressure low normal on presentation. Hold ACEi, and HCTZ. continue coreg and imdur    Diabetes mellitus type II Hold glipizide and metformin. A1c of 6.9 in December (checked at [facility] on sliding-scale insulin    COPD (chronic obstructive pulmonary disease) Reportedly desatted to 8% on room air on presentation. ABG showing mild hypercapnia. Continue O2 via nasal cannula (2 L). Signs of acute exacerbation. Continue home inhalers. Counseled strongly on smoking cessation    Tobacco abuse Smokes one half packs per day. Counseled on smoking cessation Place  on nicotine patch.  GERD Continue PPI  Ulcerative  colitis Continue sulfasalazine    Diet:cardiac/ diabetic  DVT prophylaxis: sq lovenox   Code Status: full Code Family Communication:None at bedside Disposition Plan: admit to telemetry   Mervil Wacker, [facility] Pager [phone]  Total time spent on admission :70 minutes  If 7PM-7AM, please contact night-coverage [email or web address] Password [email or web address] 06/22/2014, 7:00 PM

## 2014-06-22 NOTE — ED Notes (Signed)
Per EMS, patient had chest pain x 3 days (patient states "tightness and achy feeling"), with hypotension, dizziness.   Patient states he has had a cold for a while.  Patient states he has stiffness in neck x 3 days and tightness in chest for same amount of time.   18 G placed in R hand per EMS.   Patient states was dizzy earlier, but did not pass out.

## 2014-06-23 ENCOUNTER — Inpatient Hospital Stay (HOSPITAL_COMMUNITY): Payer: Medicare Other

## 2014-06-23 ENCOUNTER — Encounter (HOSPITAL_COMMUNITY): Payer: Self-pay | Admitting: Cardiology

## 2014-06-23 DIAGNOSIS — Z720 Tobacco use: Secondary | ICD-10-CM

## 2014-06-23 DIAGNOSIS — J438 Other emphysema: Secondary | ICD-10-CM

## 2014-06-23 LAB — TROPONIN I
TROPONIN I: 0.03 ng/mL (ref <0.031)
TROPONIN I: 0.04 ng/mL — AB (ref <0.031)

## 2014-06-23 LAB — CBC WITH DIFFERENTIAL/PLATELET
BASOS ABS: 0 10*3/uL (ref 0.0–0.1)
Basophils Relative: 0 % (ref 0–1)
Eosinophils Absolute: 0 10*3/uL (ref 0.0–0.7)
Eosinophils Relative: 0 % (ref 0–5)
HEMATOCRIT: 41.9 % (ref 39.0–52.0)
HEMOGLOBIN: 14.1 g/dL (ref 13.0–17.0)
LYMPHS PCT: 8 % — AB (ref 12–46)
Lymphs Abs: 1.4 10*3/uL (ref 0.7–4.0)
MCH: 29.4 pg (ref 26.0–34.0)
MCHC: 33.7 g/dL (ref 30.0–36.0)
MCV: 87.3 fL (ref 78.0–100.0)
MONO ABS: 1.6 10*3/uL — AB (ref 0.1–1.0)
MONOS PCT: 9 % (ref 3–12)
NEUTROS ABS: 14.3 10*3/uL — AB (ref 1.7–7.7)
NEUTROS PCT: 83 % — AB (ref 43–77)
Platelets: 273 10*3/uL (ref 150–400)
RBC: 4.8 MIL/uL (ref 4.22–5.81)
RDW: 13.5 % (ref 11.5–15.5)
WBC: 17.4 10*3/uL — ABNORMAL HIGH (ref 4.0–10.5)

## 2014-06-23 LAB — COMPREHENSIVE METABOLIC PANEL
ALBUMIN: 3.4 g/dL — AB (ref 3.5–5.2)
ALK PHOS: 66 U/L (ref 39–117)
ALT: 18 U/L (ref 0–53)
AST: 18 U/L (ref 0–37)
Anion gap: 10 (ref 5–15)
BUN: 18 mg/dL (ref 6–23)
CO2: 27 mmol/L (ref 19–32)
Calcium: 8.4 mg/dL (ref 8.4–10.5)
Chloride: 99 mEq/L (ref 96–112)
Creatinine, Ser: 1.15 mg/dL (ref 0.50–1.35)
GFR calc Af Amer: 75 mL/min — ABNORMAL LOW (ref >90)
GFR calc non Af Amer: 65 mL/min — ABNORMAL LOW (ref >90)
Glucose, Bld: 188 mg/dL — ABNORMAL HIGH (ref 70–99)
POTASSIUM: 3.4 mmol/L — AB (ref 3.5–5.1)
SODIUM: 136 mmol/L (ref 135–145)
TOTAL PROTEIN: 6.5 g/dL (ref 6.0–8.3)
Total Bilirubin: 0.9 mg/dL (ref 0.3–1.2)

## 2014-06-23 LAB — GLUCOSE, CAPILLARY
GLUCOSE-CAPILLARY: 138 mg/dL — AB (ref 70–99)
GLUCOSE-CAPILLARY: 160 mg/dL — AB (ref 70–99)
Glucose-Capillary: 121 mg/dL — ABNORMAL HIGH (ref 70–99)
Glucose-Capillary: 157 mg/dL — ABNORMAL HIGH (ref 70–99)

## 2014-06-23 LAB — VITAMIN B12: Vitamin B-12: 433 pg/mL (ref 211–911)

## 2014-06-23 LAB — TSH: TSH: 0.444 u[IU]/mL (ref 0.350–4.500)

## 2014-06-23 LAB — INFLUENZA PANEL BY PCR (TYPE A & B)
H1N1 flu by pcr: NOT DETECTED
INFLBPCR: NEGATIVE
Influenza A By PCR: NEGATIVE

## 2014-06-23 MED ORDER — ALBUTEROL SULFATE (2.5 MG/3ML) 0.083% IN NEBU: 2.5000 mg | INHALATION_SOLUTION | Freq: Three times a day (TID) | RESPIRATORY_TRACT | Status: DC

## 2014-06-23 MED ORDER — ASPIRIN 81 MG PO CHEW
81.0000 mg | CHEWABLE_TABLET | Freq: Every day | ORAL | Status: DC
  Administered 2014-06-23 – 2014-06-24 (×2): 81 mg via ORAL
  Filled 2014-06-23 (×2): qty 1

## 2014-06-23 MED ORDER — CETYLPYRIDINIUM CHLORIDE 0.05 % MT LIQD
7.0000 mL | Freq: Two times a day (BID) | OROMUCOSAL | Status: DC
  Administered 2014-06-23 (×2): 7 mL via OROMUCOSAL

## 2014-06-23 MED ORDER — VANCOMYCIN HCL IN DEXTROSE 1-5 GM/200ML-% IV SOLN
1000.0000 mg | Freq: Two times a day (BID) | INTRAVENOUS | Status: DC
  Filled 2014-06-23: qty 200

## 2014-06-23 MED ORDER — LORAZEPAM 0.5 MG PO TABS
0.5000 mg | ORAL_TABLET | Freq: Once | ORAL | Status: AC
  Administered 2014-06-23: 0.5 mg via ORAL
  Filled 2014-06-23: qty 1

## 2014-06-23 MED ORDER — POTASSIUM CHLORIDE CRYS ER 20 MEQ PO TBCR
40.0000 meq | EXTENDED_RELEASE_TABLET | Freq: Once | ORAL | Status: AC
  Administered 2014-06-23: 40 meq via ORAL
  Filled 2014-06-23: qty 4

## 2014-06-23 MED ORDER — PREGABALIN 100 MG PO CAPS
100.0000 mg | ORAL_CAPSULE | Freq: Two times a day (BID) | ORAL | Status: DC
  Administered 2014-06-23 – 2014-06-24 (×2): 100 mg via ORAL
  Filled 2014-06-23: qty 1
  Filled 2014-06-23: qty 2

## 2014-06-23 MED ORDER — IPRATROPIUM-ALBUTEROL 0.5-2.5 (3) MG/3ML IN SOLN
3.0000 mL | Freq: Three times a day (TID) | RESPIRATORY_TRACT | Status: DC
  Administered 2014-06-23 – 2014-06-24 (×3): 3 mL via RESPIRATORY_TRACT
  Filled 2014-06-23 (×4): qty 3

## 2014-06-23 MED ORDER — LOSARTAN POTASSIUM 50 MG PO TABS
50.0000 mg | ORAL_TABLET | Freq: Every day | ORAL | Status: DC
  Administered 2014-06-23 – 2014-06-24 (×2): 50 mg via ORAL
  Filled 2014-06-23 (×2): qty 1

## 2014-06-23 MED ORDER — ATORVASTATIN CALCIUM 80 MG PO TABS
80.0000 mg | ORAL_TABLET | Freq: Every day | ORAL | Status: DC
  Administered 2014-06-23: 80 mg via ORAL
  Filled 2014-06-23: qty 1

## 2014-06-23 MED ORDER — LEVOFLOXACIN 500 MG PO TABS
500.0000 mg | ORAL_TABLET | Freq: Every day | ORAL | Status: DC
  Administered 2014-06-24: 500 mg via ORAL
  Filled 2014-06-23: qty 1

## 2014-06-23 NOTE — Progress Notes (Signed)
UR Completed Aydien Majette Graves-Bigelow, RN,BSN [phone]  

## 2014-06-23 NOTE — Progress Notes (Signed)
Chart reviewed.   [facility] PROGRESS NOTE  CHELSEA NUSZ KDT:[id number] DOB: 1945/11/04 DOA: 06/22/2014 PCP: Celedonio Savage, MD  Assessment/Plan:  Active Problems:   Chest pain at rest: atypical. MI ruled out. Cardiology recommends no further workup   Diabetes mellitus: continue SSI Fever/leukocytosis, cough productive of brown and purulent sputum.  Has rales right base, but CXR negative.  Clinically consistent with pneumonia. Will continue vanc and zosyn for now. Repeat CXR to look for developing infiltrate.  Patient anxious to go. If CXR negative, would change to po abx to cover acute bronchitis and potentially discharge tomorrow.  Will also check respiratory virus panel.   COPD (chronic obstructive pulmonary disease): no wheeze currently   Tobacco abuse: smokes 1.5 ppd. counselled against.   Acute encephalopathy: slow, but appropriate today. Mild hypokalemia: Replete  HPI/Subjective: Wants to go home.   Objective: Filed Vitals:   06/23/14 0757  BP: 143/71  Pulse: 95  Temp: 98.3 F (36.8 C)  Resp: 18    Intake/Output Summary (Last 24 hours) at 06/23/14 1151 Last data filed at 06/23/14 0606  Gross per 24 hour  Intake    990 ml  Output   1015 ml  Net    -25 ml   Filed Weights   06/22/14 1455 06/22/14 1937  Weight: 95.255 kg (210 lb) 93.35 kg (205 lb 12.8 oz)    Exam:   General:  Awake, alert, appropriate, but unable to answer specifics of health history.  Emesis basin full of purulent and brownish sputum  Cardiovascular: Regular rate rhythm without murmurs gallops rubs  Respiratory: Diminished throughout. No wheeze. Good air movement. Rales at the right base/mid lung field  Abdomen: Soft nontender nondistended  Ext: No clubbing cyanosis or edema  Basic Metabolic Panel:  Recent Labs Lab 06/22/14 1520 06/23/14 0140  NA 139 136  K 4.4 3.4*  CL 101 99  CO2 29 27  GLUCOSE 120* 188*  BUN 17 18  CREATININE 1.33 1.15  CALCIUM 8.5 8.4   Liver Function  Tests:  Recent Labs Lab 06/22/14 2231 06/23/14 0140  AST 21 18  ALT 22 18  ALKPHOS 79 66  BILITOT 0.7 0.9  PROT 7.7 6.5  ALBUMIN 3.8 3.4*   No results for input(s): LIPASE, AMYLASE in the last 168 hours.  Recent Labs Lab 06/22/14 2231  AMMONIA 31   CBC:  Recent Labs Lab 06/22/14 1520 06/23/14 0140  WBC 20.6* 17.4*  NEUTROABS  --  14.3*  HGB 14.2 14.1  HCT 42.3 41.9  MCV 87.0 87.3  PLT 283 273   Cardiac Enzymes:  Recent Labs Lab 06/22/14 1520 06/22/14 2231 06/23/14 0140 06/23/14 0819  TROPONINI 0.03 <0.03 0.03 0.04*   BNP (last 3 results) No results for input(s): PROBNP in the last 8760 hours. CBG:  Recent Labs Lab 06/22/14 2120 06/23/14 0756 06/23/14 1123  GLUCAP 106* 121* 157*    No results found for this or any previous visit (from the past 240 hour(s)).   Studies: Dg Chest 2 View  06/22/2014   CLINICAL DATA:  Chest pain for 3 days.  Hypotension.  Dizziness.  EXAM: CHEST  2 VIEW  COMPARISON:  05/09/2014.  FINDINGS: Cardiopericardial silhouette is within normal limits for projection. Emphysema is present. There is flattening of the hemidiaphragms and enlargement of the retrosternal clear space. The costophrenic angles are excluded from view on the lateral. Aortic arch atherosclerosis. No airspace disease or pleural effusion is evident. No pneumothorax.  IMPRESSION: Emphysema without active cardiopulmonary disease.  Electronically Signed   By: Dereck Ligas M.D.   On: 06/22/2014 16:05   Ct Head Wo Contrast  06/22/2014   CLINICAL DATA:  Dizziness for 3 days.  Acute encephalopathy.  EXAM: CT HEAD WITHOUT CONTRAST  CT CERVICAL SPINE WITHOUT CONTRAST  TECHNIQUE: Multidetector CT imaging of the head and cervical spine was performed following the standard protocol without intravenous contrast. Multiplanar CT image reconstructions of the cervical spine were also generated.  COMPARISON:  Head CT -06/15/2009; 06/15/2024  FINDINGS: CT HEAD FINDINGS  Examination  is degraded secondary to patient motion artifact necessitating the acquisition of additional images.  There is mild diffuse atrophy with sulcal prominence and prominence of the bifrontal extra-axial spaces. There is mild centralized volume loss with commensurate ex vacuo dilatation of the ventricular system. Scattered periventricular hypodensities compatible with microvascular ischemic disease. Right-sided basal ganglial calcifications. The gray-white differentiation is otherwise well maintained. No CT evidence of acute large territory infarct. Unchanged size and configuration of the ventricles and basilar cisterns. No midline shift. Intracranial atherosclerosis.  Limited visualization of the paranasal sinuses and mastoid air cells is normal. No air-fluid levels. Regional soft tissues appear normal. No displaced calvarial fracture.  CT CERVICAL SPINE FINDINGS  C1 to the inferior endplate of T2 is imaged.  There is suboptimal evaluation of the caudal aspect of the cervical spine secondary to beam hardening artifact from the patient's overlying shoulders. Normal alignment of the cervical spine. No anterolisthesis or retrolisthesis. The bilateral facets are normally aligned. The dens is normally positioned between the lateral masses of C1. Normal atlantodental and atlantoaxial articulations. 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  No fracture or static subluxation of the cervical spine. Cervical vertebral body heights are preserved. Prevertebral soft tissues are normal.  Intervertebral disc space heights are preserved.  Atherosclerotic plaque within the bilateral carotid bulbs. Regional soft tissues appear otherwise normal. Limited visualization lung apices demonstrates centrilobular emphysematous change  IMPRESSION: 1. Mild atrophy and microvascular ischemic disease without definite superimposed acute intracranial process on this motion degraded examination.  2. No fracture or static subluxation of the cervical spine.   Electronically Signed   By: Sandi Mariscal M.D.   On: 06/22/2014 23:47   Ct Cervical Spine W Contrast  06/22/2014   CLINICAL DATA:  Dizziness for 3 days.  Acute encephalopathy.  EXAM: CT HEAD WITHOUT CONTRAST  CT CERVICAL SPINE WITHOUT CONTRAST  TECHNIQUE: Multidetector CT imaging of the head and cervical spine was performed following the standard protocol without intravenous contrast. Multiplanar CT image reconstructions of the cervical spine were also generated.  COMPARISON:  Head CT -06/15/2009; 06/15/2024  FINDINGS: CT HEAD FINDINGS  Examination is degraded secondary to patient motion artifact necessitating the acquisition of additional images.  There is mild diffuse atrophy with sulcal prominence and prominence of the bifrontal extra-axial spaces. There is mild centralized volume loss with commensurate ex vacuo dilatation of the ventricular system. Scattered periventricular hypodensities compatible with microvascular ischemic disease. Right-sided basal ganglial calcifications. The gray-white differentiation is otherwise well maintained. No CT evidence of acute large territory infarct. Unchanged size and configuration of the ventricles and basilar cisterns. No midline shift. Intracranial atherosclerosis.  Limited visualization of the paranasal sinuses and mastoid air cells is normal. No air-fluid levels. Regional soft tissues appear normal. No displaced calvarial fracture.  CT CERVICAL SPINE FINDINGS  C1 to the inferior endplate of T2 is imaged.  There is suboptimal evaluation of the caudal aspect  of the cervical spine secondary to beam hardening artifact from the patient's overlying shoulders. Normal alignment of the cervical spine. No anterolisthesis or retrolisthesis. The bilateral facets are normally aligned. The dens is normally positioned between the lateral masses of C1. Normal atlantodental and atlantoaxial articulations. 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  No fracture or static subluxation of the cervical spine. Cervical vertebral body heights are preserved. Prevertebral soft tissues are normal.  Intervertebral disc space heights are preserved.  Atherosclerotic plaque within the bilateral carotid bulbs. Regional soft tissues appear otherwise normal. Limited visualization lung apices demonstrates centrilobular emphysematous change  IMPRESSION: 1. Mild atrophy and microvascular ischemic disease without definite superimposed acute intracranial process on this motion degraded examination. 2. No fracture or static subluxation of the cervical spine.   Electronically Signed   By: Sandi Mariscal M.D.   On: 06/22/2014 23:47    Scheduled Meds: . antiseptic oral rinse  7 mL Mouth Rinse BID  . aspirin  81 mg Oral Daily  . atorvastatin  80 mg Oral q1800  . carvedilol  25 mg Oral BID WC  . clopidogrel  75 mg Oral Q breakfast  . enoxaparin (LOVENOX) injection  40 mg Subcutaneous Q24H  . insulin aspart  0-15 Units Subcutaneous TID WC  . insulin aspart  0-5 Units Subcutaneous QHS  . isosorbide mononitrate  30 mg Oral Daily  . losartan  50 mg Oral Daily  . nicotine  21 mg Transdermal Daily  . pantoprazole  40 mg Oral Daily  . piperacillin-tazobactam (ZOSYN)  IV  3.375 g Intravenous 3 times per day  . potassium chloride  10 mEq Oral Daily  . sodium chloride  3 mL Intravenous Q12H  . sulfaSALAzine  500 mg Oral TID  . vancomycin  750 mg Intravenous Q12H   Continuous Infusions:   Time spent: 35 minutes  [facility] [email or web address], password [facility] 06/23/2014, 11:51 AM  LOS: 1 day

## 2014-06-23 NOTE — Care Management Note (Unsigned)
    Page 1 of 1   06/23/2014     3:56:49 PM CARE MANAGEMENT NOTE 06/23/2014  Patient:  Ronald [name], Ronald [name]   Account Number:  [id number]  Date Initiated:  06/23/2014  Documentation initiated by:  GRAVES-BIGELOW,Shambria Camerer  Subjective/Objective Assessment:   Pt admitted for cp at rest. Pt is from home with wife. Per pt he has DME nebulizer at home.     Action/Plan:   CM to monitor for disposition needs.   Anticipated DC Date:  06/25/2014   Anticipated DC Plan:  [place]  CM consult      Choice offered to / List presented to:             Status of service:  In process, will continue to follow Medicare Important Message given?  YES (If response is "NO", the following Medicare IM given date fields will be blank) Date Medicare IM given:  06/23/2014 Medicare IM given by:  GRAVES-BIGELOW,Loyd [place] Date Additional Medicare IM given:   Additional Medicare IM given by:    Discharge Disposition:    Per UR Regulation:  Reviewed for med. necessity/level of care/duration of stay  If discussed at [place] of Stay Meetings, dates discussed:    Comments:

## 2014-06-23 NOTE — Progress Notes (Signed)
Patient returned from [place] about 2330.  RN hooked patient back up to bedside monitor, had been on telemetry box for travel.  Vital signs improved, no use of accessory muscles to breath noted by RN.  Patient still stating he feels anxious and requesting Xanax.  [facility] paged with patient status update.

## 2014-06-23 NOTE — Progress Notes (Signed)
Pt has been on room air SOB with exertion, desats to mid 80s, supplemental 02 BNC applied, 02 at 94% on 3 liters

## 2014-06-23 NOTE — Progress Notes (Signed)
ANTIBIOTIC CONSULT NOTE - FOLLOW UP  Pharmacy Consult for vancomycin and Zosyn Indication: pneumonia  No Known Allergies  Patient Measurements: Height: 5\' 9"  (175.3 cm) Weight: 205 lb 12.8 oz (93.35 kg) IBW/kg (Calculated) : 70.7  Vital Signs: Temp: 98.3 F (36.8 C) (01/15 0757) Temp Source: Oral (01/15 0757) BP: 143/71 mmHg (01/15 0757) Pulse Rate: 95 (01/15 0757) Intake/Output from previous day: 01/14 0701 - 01/15 0700 In: 990 [P.O.:440; IV Piggyback:550] Out: 4765 [Urine:1015] Intake/Output from this shift: Total I/O In: 240 [P.O.:240] Out: -   Labs:  Recent Labs  06/22/14 1520 06/23/14 0140  WBC 20.6* 17.4*  HGB 14.2 14.1  PLT 283 273  CREATININE 1.33 1.15   Estimated Creatinine Clearance: 71.3 mL/min (by C-G formula based on Cr of 1.15). No results for input(s): VANCOTROUGH, VANCOPEAK, VANCORANDOM, GENTTROUGH, GENTPEAK, GENTRANDOM, TOBRATROUGH, TOBRAPEAK, TOBRARND, AMIKACINPEAK, AMIKACINTROU, AMIKACIN in the last 72 hours.   Microbiology: No results found for this or any previous visit (from the past 720 hour(s)).  Anti-infectives    Start     Dose/Rate Route Frequency Ordered Stop   06/24/14 0000  vancomycin (VANCOCIN) IVPB 1000 mg/200 mL premix     1,000 mg200 mL/hr over 60 Minutes Intravenous Every 12 hours 06/23/14 1401     06/23/14 0730  vancomycin (VANCOCIN) IVPB 750 mg/150 ml premix  Status:  Discontinued     750 mg150 mL/hr over 60 Minutes Intravenous Every 12 hours 06/22/14 1924 06/23/14 1401   06/23/14 0130  piperacillin-tazobactam (ZOSYN) IVPB 3.375 g     3.375 g12.5 mL/hr over 240 Minutes Intravenous 3 times per day 06/22/14 1930     06/23/14 0100  piperacillin-tazobactam (ZOSYN) IVPB 3.375 g  Status:  Discontinued     3.375 g12.5 mL/hr over 240 Minutes Intravenous 3 times per day 06/22/14 1922 06/22/14 1930   06/22/14 1930  vancomycin (VANCOCIN) 1,500 mg in sodium chloride 0.9 % 500 mL IVPB     1,500 mg250 mL/hr over 120 Minutes Intravenous   Once 06/22/14 1918 06/23/14 0217   06/22/14 1930  piperacillin-tazobactam (ZOSYN) IVPB 3.375 g     3.375 g100 mL/hr over 30 Minutes Intravenous  Once 06/22/14 1918 06/23/14 0002      Assessment: 69 y/o male on day 2 Zosyn and day 1 vancomycin for sepsis, pneumonia. SCr improved, est CrCl ~71 ml/min. Patient was febrile last night but afebrile today. WBC are trending down.  vanc 1/15>> Zosyn 1/14>>  1/14 BCx2 - pending 1/14 Viral panel - pending 1/14 UCx - pending  Goal of Therapy:  Vancomycin trough level 15-20 mcg/ml  Plan:  - Increase vancomycin to 1000 mg IV q12h - Continue Zosyn 3.375 g IV q8h to be infused over 4 hours - Monitor renal function and clinical progress - May narrow tomorrow if repeat CXR negative  [place], [place].D., BCPS Clinical Pharmacist Pager: [phone] 06/23/2014 2:02 PM

## 2014-06-23 NOTE — Progress Notes (Signed)
Medicare Important Message given? YES   (If response is "NO", the following Medicare IM given date fields will be blank)   Date Medicare IM given:   Medicare IM given by: Graves-Bigelow, Tamya Denardo  

## 2014-06-23 NOTE — Consult Note (Signed)
Primary cardiologist: Ledell Noss  HPI: 69 year old male with past medical history of diabetes mellitus, hypertension, hyperlipidemia, coronary artery disease for evaluation of chest pain. Patient recently admitted to [facility] with non-ST elevation myocardial infarction. He underwent cardiac catheterization with 2 drug-eluting stents to his LAD. Echocardiogram showed normal LV function and mild tricuspid regurgitation. Patient has chronic dyspnea on exertion from COPD. No orthopnea or PND. He has had occasional chest tightness for years not related to activities. This did not change following stent placement. The patient has had a productive cough since being discharged in December. No hemoptysis. He also developed neck and head pain for the past 3 days and presented for further evaluation. Cardiology asked to evaluate for chest tightness.  Medications Prior to Admission  Medication Sig Dispense Refill  . albuterol (PROVENTIL HFA;VENTOLIN HFA) 108 (90 BASE) MCG/ACT inhaler Inhale 2 puffs into the lungs every 6 (six) hours as needed for wheezing or shortness of breath.    Marland Kitchen amLODipine (NORVASC) 10 MG tablet Take 10 mg by mouth daily.    . benzonatate (TESSALON) 100 MG capsule Take 100 mg by mouth 3 (three) times daily as needed for cough.    . carvedilol (COREG) 25 MG tablet Take 25 mg by mouth 2 (two) times daily with a meal.    . clopidogrel (PLAVIX) 75 MG tablet Take 75 mg by mouth daily.    Marland Kitchen glipiZIDE (GLUCOTROL XL) 2.5 MG 24 hr tablet Take 2.5 mg by mouth daily with breakfast.    . hydrochlorothiazide (HYDRODIURIL) 25 MG tablet Take 25 mg by mouth daily.    . isosorbide mononitrate (IMDUR) 30 MG 24 hr tablet Take 30 mg by mouth daily.    Marland Kitchen lisinopril (PRINIVIL,ZESTRIL) 20 MG tablet Take 20 mg by mouth daily.    Marland Kitchen lovastatin (MEVACOR) 20 MG tablet Take 20 mg by mouth at bedtime.    . metFORMIN (GLUCOPHAGE-XR) 500 MG 24 hr tablet Take 1,000 mg by mouth daily with breakfast.    .  nitroGLYCERIN (NITROSTAT) 0.4 MG SL tablet Place 0.4 mg under the tongue every 5 (five) minutes as needed for chest pain.    . pantoprazole (PROTONIX) 40 MG tablet Take 40 mg by mouth daily.    . potassium chloride (K-DUR) 10 MEQ tablet Take 10 mEq by mouth daily.    . pregabalin (LYRICA) 225 MG capsule Take 225 mg by mouth 2 (two) times daily.    Marland Kitchen sulfaSALAzine (AZULFIDINE) 500 MG tablet Take 500 mg by mouth 3 (three) times daily.      No Known Allergies   Past Medical History  Diagnosis Date  . Ulcerative colitis   . Hypertension   . Asthma   . COPD (chronic obstructive pulmonary disease)   . GERD (gastroesophageal reflux disease)   . Type II diabetes mellitus   . Coronary artery disease   . TIA (transient ischemic attack)     "they say I've had some mini strokes; don't know when"; denies residual on 06/22/2014)  . High cholesterol   . NSTEMI (non-ST elevated myocardial infarction) 05/2014    with stent placement  . Chronic lower back pain   . Anxiety   . Depression     Past Surgical History  Procedure Laterality Date  . Appendectomy    . Cholecystectomy    . Tumor excision Right ~ 1999    "side of my upper head"  . Coronary angioplasty with stent placement  05/2014    "2"  . Cardiac  catheterization  1990's X 3    History   Social History  . Marital Status: Divorced    Spouse Name: N/A    Number of Children: 2  . Years of Education: N/A   Occupational History  . Not on file.   Social History Main Topics  . Smoking status: Current Every Day Smoker -- 1.50 packs/day for 48 years    Types: Cigarettes  . Smokeless tobacco: Never Used  . Alcohol Use: Yes     Comment: "drank some when I was 18"  . Drug Use: No  . Sexual Activity: No   Other Topics Concern  . Not on file   Social History Narrative    Family History  Problem Relation Age of Onset  . CAD Father     ROS: neck and head pain, productive cough, subjective chills but no hemoptysis,  dysphasia, odynophagia, melena, hematochezia, dysuria, hematuria, rash, seizure activity, orthopnea, PND, pedal edema, claudication. Remaining systems are negative.  Physical Exam:   Blood pressure 120/48, pulse 85, temperature 98.4 F (36.9 C), temperature source Oral, resp. rate 25, height 5' 9"  (1.753 m), weight 205 lb 12.8 oz (93.35 kg), SpO2 98 %.  General:  Well developed/chronically ill appearing in NAD Skin warm/dry Patient not depressed No peripheral clubbing Back-normal HEENT-normal/normal eyelids Neck supple/normal carotid upstroke bilaterally; no bruits; no JVD; no thyromegaly chest - diminished BS throughout CV - RRR/normal S1 and S2; no murmurs, rubs or gallops;  PMI nondisplaced Abdomen -NT/ND, no HSM, no mass, + bowel sounds, no bruit 2+ femoral pulses, no bruits Ext-no edema, chords, 2+ PT Neuro-grossly nonfocal  ECG sinus rhythm, left ventricular hypertrophy with repolarization abnormality.  Results for orders placed or performed during the hospital encounter of 06/22/14 (from the past 48 hour(s))  Basic metabolic panel     Status: Abnormal   Collection Time: 06/22/14  3:20 PM  Result Value Ref Range   Sodium 139 135 - 145 mmol/L    Comment: Please note change in reference range.   Potassium 4.4 3.5 - 5.1 mmol/L    Comment: Please note change in reference range.   Chloride 101 96 - 112 mEq/L   CO2 29 19 - 32 mmol/L   Glucose, Bld 120 (H) 70 - 99 mg/dL   BUN 17 6 - 23 mg/dL   Creatinine, Ser 1.33 0.50 - 1.35 mg/dL   Calcium 8.5 8.4 - 10.5 mg/dL   GFR calc non Af Amer 54 (L) >90 mL/min   GFR calc Af Amer 63 (L) >90 mL/min    Comment: (NOTE) The eGFR has been calculated using the CKD EPI equation. This calculation has not been validated in all clinical situations. eGFR's persistently <90 mL/min signify possible Chronic Kidney Disease.    Anion gap 9 5 - 15  CBC     Status: Abnormal   Collection Time: 06/22/14  3:20 PM  Result Value Ref Range   WBC 20.6  (H) 4.0 - 10.5 K/uL   RBC 4.86 4.22 - 5.81 MIL/uL   Hemoglobin 14.2 13.0 - 17.0 g/dL   HCT 42.3 39.0 - 52.0 %   MCV 87.0 78.0 - 100.0 fL   MCH 29.2 26.0 - 34.0 pg   MCHC 33.6 30.0 - 36.0 g/dL   RDW 13.4 11.5 - 15.5 %   Platelets 283 150 - 400 K/uL  Troponin I     Status: None   Collection Time: 06/22/14  3:20 PM  Result Value Ref Range   Troponin I  0.03 <0.031 ng/mL    Comment:        NO INDICATION OF MYOCARDIAL INJURY. Please note change in reference range.   I-Stat arterial blood gas, ED     Status: Abnormal   Collection Time: 06/22/14  6:24 PM  Result Value Ref Range   pH, Arterial 7.303 (L) 7.350 - 7.450   pCO2 arterial 54.9 (H) 35.0 - 45.0 mmHg   pO2, Arterial 57.0 (L) 80.0 - 100.0 mmHg   Bicarbonate 27.2 (H) 20.0 - 24.0 mEq/L   TCO2 29 0 - 100 mmol/L   O2 Saturation 85.0 %   Patient temperature 98.7 F    Collection site RADIAL, ALLEN'S TEST ACCEPTABLE    Drawn by Operator    Sample type ARTERIAL   Urinalysis, Routine w reflex microscopic     Status: Abnormal   Collection Time: 06/22/14  7:18 PM  Result Value Ref Range   Color, Urine AMBER (A) YELLOW    Comment: BIOCHEMICALS MAY BE AFFECTED BY COLOR   APPearance HAZY (A) CLEAR   Specific Gravity, Urine 1.026 1.005 - 1.030   pH 5.0 5.0 - 8.0   Glucose, UA NEGATIVE NEGATIVE mg/dL   Hgb urine dipstick NEGATIVE NEGATIVE   Bilirubin Urine SMALL (A) NEGATIVE   Ketones, ur 15 (A) NEGATIVE mg/dL   Protein, ur NEGATIVE NEGATIVE mg/dL   Urobilinogen, UA 0.2 0.0 - 1.0 mg/dL   Nitrite NEGATIVE NEGATIVE   Leukocytes, UA NEGATIVE NEGATIVE    Comment: MICROSCOPIC NOT DONE ON URINES WITH NEGATIVE PROTEIN, BLOOD, LEUKOCYTES, NITRITE, OR GLUCOSE <1000 mg/dL.  Urine rapid drug screen (hosp performed)     Status: Abnormal   Collection Time: 06/22/14  7:18 PM  Result Value Ref Range   Opiates POSITIVE (A) NONE DETECTED   Cocaine NONE DETECTED NONE DETECTED   Benzodiazepines POSITIVE (A) NONE DETECTED   Amphetamines NONE DETECTED  NONE DETECTED   Tetrahydrocannabinol NONE DETECTED NONE DETECTED   Barbiturates NONE DETECTED NONE DETECTED    Comment:        DRUG SCREEN FOR MEDICAL PURPOSES ONLY.  IF CONFIRMATION IS NEEDED FOR ANY PURPOSE, NOTIFY LAB WITHIN 5 DAYS.        LOWEST DETECTABLE LIMITS FOR URINE DRUG SCREEN Drug Class       Cutoff (ng/mL) Amphetamine      1000 Barbiturate      200 Benzodiazepine   696 Tricyclics       295 Opiates          300 Cocaine          300 THC              50   Brain natriuretic peptide     Status: None   Collection Time: 06/22/14  8:31 PM  Result Value Ref Range   B Natriuretic Peptide 67.2 0.0 - 100.0 pg/mL    Comment: Please note change in reference range.  Glucose, capillary     Status: Abnormal   Collection Time: 06/22/14  9:20 PM  Result Value Ref Range   Glucose-Capillary 106 (H) 70 - 99 mg/dL  TSH     Status: None   Collection Time: 06/22/14 10:31 PM  Result Value Ref Range   TSH 0.444 0.350 - 4.500 uIU/mL  Ammonia     Status: None   Collection Time: 06/22/14 10:31 PM  Result Value Ref Range   Ammonia 31 11 - 32 umol/L    Comment: Please note change in reference range.  Hepatic function  panel     Status: None   Collection Time: 06/22/14 10:31 PM  Result Value Ref Range   Total Protein 7.7 6.0 - 8.3 g/dL   Albumin 3.8 3.5 - 5.2 g/dL   AST 21 0 - 37 U/L   ALT 22 0 - 53 U/L   Alkaline Phosphatase 79 39 - 117 U/L   Total Bilirubin 0.7 0.3 - 1.2 mg/dL   Bilirubin, Direct <0.1 0.0 - 0.3 mg/dL   Indirect Bilirubin NOT CALCULATED 0.3 - 0.9 mg/dL  Troponin I     Status: None   Collection Time: 06/22/14 10:31 PM  Result Value Ref Range   Troponin I <0.03 <0.031 ng/mL    Comment:        NO INDICATION OF MYOCARDIAL INJURY. Please note change in reference range.   Comprehensive metabolic panel     Status: Abnormal   Collection Time: 06/23/14  1:40 AM  Result Value Ref Range   Sodium 136 135 - 145 mmol/L    Comment: Please note change in reference range.     Potassium 3.4 (L) 3.5 - 5.1 mmol/L    Comment: Please note change in reference range. DELTA CHECK NOTED NO VISIBLE HEMOLYSIS    Chloride 99 96 - 112 mEq/L   CO2 27 19 - 32 mmol/L   Glucose, Bld 188 (H) 70 - 99 mg/dL   BUN 18 6 - 23 mg/dL   Creatinine, Ser 1.15 0.50 - 1.35 mg/dL   Calcium 8.4 8.4 - 10.5 mg/dL   Total Protein 6.5 6.0 - 8.3 g/dL   Albumin 3.4 (L) 3.5 - 5.2 g/dL   AST 18 0 - 37 U/L   ALT 18 0 - 53 U/L   Alkaline Phosphatase 66 39 - 117 U/L   Total Bilirubin 0.9 0.3 - 1.2 mg/dL   GFR calc non Af Amer 65 (L) >90 mL/min   GFR calc Af Amer 75 (L) >90 mL/min    Comment: (NOTE) The eGFR has been calculated using the CKD EPI equation. This calculation has not been validated in all clinical situations. eGFR's persistently <90 mL/min signify possible Chronic Kidney Disease.    Anion gap 10 5 - 15  CBC with Differential     Status: Abnormal   Collection Time: 06/23/14  1:40 AM  Result Value Ref Range   WBC 17.4 (H) 4.0 - 10.5 K/uL   RBC 4.80 4.22 - 5.81 MIL/uL   Hemoglobin 14.1 13.0 - 17.0 g/dL   HCT 41.9 39.0 - 52.0 %   MCV 87.3 78.0 - 100.0 fL   MCH 29.4 26.0 - 34.0 pg   MCHC 33.7 30.0 - 36.0 g/dL   RDW 13.5 11.5 - 15.5 %   Platelets 273 150 - 400 K/uL   Neutrophils Relative % 83 (H) 43 - 77 %   Neutro Abs 14.3 (H) 1.7 - 7.7 K/uL   Lymphocytes Relative 8 (L) 12 - 46 %   Lymphs Abs 1.4 0.7 - 4.0 K/uL   Monocytes Relative 9 3 - 12 %   Monocytes Absolute 1.6 (H) 0.1 - 1.0 K/uL   Eosinophils Relative 0 0 - 5 %   Eosinophils Absolute 0.0 0.0 - 0.7 K/uL   Basophils Relative 0 0 - 1 %   Basophils Absolute 0.0 0.0 - 0.1 K/uL  Troponin I     Status: None   Collection Time: 06/23/14  1:40 AM  Result Value Ref Range   Troponin I 0.03 <0.031 ng/mL    Comment:  NO INDICATION OF MYOCARDIAL INJURY. Please note change in reference range.     Dg Chest 2 View  06/22/2014   CLINICAL DATA:  Chest pain for 3 days.  Hypotension.  Dizziness.  EXAM: CHEST  2 VIEW   COMPARISON:  05/09/2014.  FINDINGS: Cardiopericardial silhouette is within normal limits for projection. Emphysema is present. There is flattening of the hemidiaphragms and enlargement of the retrosternal clear space. The costophrenic angles are excluded from view on the lateral. Aortic arch atherosclerosis. No airspace disease or pleural effusion is evident. No pneumothorax.  IMPRESSION: Emphysema without active cardiopulmonary disease.   Electronically Signed   By: Dereck Ligas M.D.   On: 06/22/2014 16:05   Ct Head Wo Contrast  06/22/2014   CLINICAL DATA:  Dizziness for 3 days.  Acute encephalopathy.  EXAM: CT HEAD WITHOUT CONTRAST  CT CERVICAL SPINE WITHOUT CONTRAST  TECHNIQUE: Multidetector CT imaging of the head and cervical spine was performed following the standard protocol without intravenous contrast. Multiplanar CT image reconstructions of the cervical spine were also generated.  COMPARISON:  Head CT -06/15/2009; 06/15/2024  FINDINGS: CT HEAD FINDINGS  Examination is degraded secondary to patient motion artifact necessitating the acquisition of additional images.  There is mild diffuse atrophy with sulcal prominence and prominence of the bifrontal extra-axial spaces. There is mild centralized volume loss with commensurate ex vacuo dilatation of the ventricular system. Scattered periventricular hypodensities compatible with microvascular ischemic disease. Right-sided basal ganglial calcifications. The gray-white differentiation is otherwise well maintained. No CT evidence of acute large territory infarct. Unchanged size and configuration of the ventricles and basilar cisterns. No midline shift. Intracranial atherosclerosis.  Limited visualization of the paranasal sinuses and mastoid air cells is normal. No air-fluid levels. Regional soft tissues appear normal. No displaced calvarial fracture.  CT CERVICAL SPINE FINDINGS  C1 to the inferior endplate of T2 is imaged.  There is suboptimal evaluation of  the caudal aspect of the cervical spine secondary to beam hardening artifact from the patient's overlying shoulders. Normal alignment of the cervical spine. No anterolisthesis or retrolisthesis. The bilateral facets are normally aligned. The dens is normally positioned between the lateral masses of C1. Normal atlantodental and atlantoaxial articulations. 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  No fracture or static subluxation of the cervical spine. Cervical vertebral body heights are preserved. Prevertebral soft tissues are normal.  Intervertebral disc space heights are preserved.  Atherosclerotic plaque within the bilateral carotid bulbs. Regional soft tissues appear otherwise normal. Limited visualization lung apices demonstrates centrilobular emphysematous change  IMPRESSION: 1. Mild atrophy and microvascular ischemic disease without definite superimposed acute intracranial process on this motion degraded examination. 2. No fracture or static subluxation of the cervical spine.   Electronically Signed   By: Sandi Mariscal M.D.   On: 06/22/2014 23:47   Ct Cervical Spine W Contrast  06/22/2014   CLINICAL DATA:  Dizziness for 3 days.  Acute encephalopathy.  EXAM: CT HEAD WITHOUT CONTRAST  CT CERVICAL SPINE WITHOUT CONTRAST  TECHNIQUE: Multidetector CT imaging of the head and cervical spine was performed following the standard protocol without intravenous contrast. Multiplanar CT image reconstructions of the cervical spine were also generated.  COMPARISON:  Head CT -06/15/2009; 06/15/2024  FINDINGS: CT HEAD FINDINGS  Examination is degraded secondary to patient motion artifact necessitating the acquisition of additional images.  There is mild diffuse atrophy with sulcal prominence and prominence of the bifrontal extra-axial spaces. There is mild  centralized volume loss with commensurate ex vacuo dilatation of the ventricular system. Scattered periventricular  hypodensities compatible with microvascular ischemic disease. Right-sided basal ganglial calcifications. The gray-white differentiation is otherwise well maintained. No CT evidence of acute large territory infarct. Unchanged size and configuration of the ventricles and basilar cisterns. No midline shift. Intracranial atherosclerosis.  Limited visualization of the paranasal sinuses and mastoid air cells is normal. No air-fluid levels. Regional soft tissues appear normal. No displaced calvarial fracture.  CT CERVICAL SPINE FINDINGS  C1 to the inferior endplate of T2 is imaged.  There is suboptimal evaluation of the caudal aspect of the cervical spine secondary to beam hardening artifact from the patient's overlying shoulders. Normal alignment of the cervical spine. No anterolisthesis or retrolisthesis. The bilateral facets are normally aligned. The dens is normally positioned between the lateral masses of C1. Normal atlantodental and atlantoaxial articulations. 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  No fracture or static subluxation of the cervical spine. Cervical vertebral body heights are preserved. Prevertebral soft tissues are normal.  Intervertebral disc space heights are preserved.  Atherosclerotic plaque within the bilateral carotid bulbs. Regional soft tissues appear otherwise normal. Limited visualization lung apices demonstrates centrilobular emphysematous change  IMPRESSION: 1. Mild atrophy and microvascular ischemic disease without definite superimposed acute intracranial process on this motion degraded examination. 2. No fracture or static subluxation of the cervical spine.   Electronically Signed   By: Sandi Mariscal M.D.   On: 06/22/2014 23:47    Assessment/Plan 1 chest pain-symptoms are atypical. He has had intermittent chest tightness for years. His symptoms did not change following recent PCI. His enzymes are negative. Electrocardiogram  shows left ventricular hypertrophy with repolarization abnormality. Would not suggest further cardiac workup. 2 coronary artery disease-continue Plavix but add aspirin 81 mg daily. Continue statin. 3 hyperlipidemia-will change to high-dose statin given documented coronary disease. 4 COPD/bronchitis-continue antibiotics. Significant productive cough. Management per primary care. 5 tobacco abuse-patient counseled on discontinuing. 6 hypertension-given baseline diabetes mellitus I would prefer that he be on an ARB. Discontinue amlodipine. Add Cozaar 50 mg daily and follow. Please arrange follow-up with one of our cardiologists in [facility] following discharge. Please call with questions. Kirk Ruths MD 06/23/2014, 7:58 AM

## 2014-06-23 NOTE — Progress Notes (Signed)
Lab came to draw STAT blood cultures around 2000-2030, per patient, lab told patient they would come back around 2100 since patient was eating dinner at that time.  Writing Designer, multimedia called lab multiple times around 2200 due to no one from lab returning as of 2200.  Patient ambulated to bathroom at about 2145, had a bowel movement and ambulated back to bed about 2200.  Patient back in bed, dyspnea noted and use of accessory muscles to breath.  Respirations high 20's.  Heart rate 120's-130's, sinus tachycardia on monitor.  Patient stated he felt anxious and requesting Xanax.  Patient also uncontrollably shaking at this time.  Rectal temp taken at 2216 and was 100.4. Antibiotics already ordered, awaiting to start for STAT blood cultures to be drawn.  [facility] paged and was updated of all information in this note.  Baltazar Najjar with [facility] returned page, she stated to call back in about thirty minutes with patient status update.

## 2014-06-24 LAB — GLUCOSE, CAPILLARY
Glucose-Capillary: 151 mg/dL — ABNORMAL HIGH (ref 70–99)
Glucose-Capillary: 203 mg/dL — ABNORMAL HIGH (ref 70–99)

## 2014-06-24 LAB — CBC WITH DIFFERENTIAL/PLATELET
Basophils Absolute: 0 10*3/uL (ref 0.0–0.1)
Basophils Relative: 0 % (ref 0–1)
EOS ABS: 0.1 10*3/uL (ref 0.0–0.7)
Eosinophils Relative: 1 % (ref 0–5)
HCT: 42.4 % (ref 39.0–52.0)
Hemoglobin: 14.7 g/dL (ref 13.0–17.0)
Lymphocytes Relative: 19 % (ref 12–46)
Lymphs Abs: 2.5 10*3/uL (ref 0.7–4.0)
MCH: 30.3 pg (ref 26.0–34.0)
MCHC: 34.7 g/dL (ref 30.0–36.0)
MCV: 87.4 fL (ref 78.0–100.0)
MONO ABS: 1.4 10*3/uL — AB (ref 0.1–1.0)
MONOS PCT: 11 % (ref 3–12)
NEUTROS ABS: 8.9 10*3/uL — AB (ref 1.7–7.7)
Neutrophils Relative %: 69 % (ref 43–77)
Platelets: 322 10*3/uL (ref 150–400)
RBC: 4.85 MIL/uL (ref 4.22–5.81)
RDW: 13.4 % (ref 11.5–15.5)
WBC: 12.9 10*3/uL — ABNORMAL HIGH (ref 4.0–10.5)

## 2014-06-24 LAB — URINE CULTURE
Colony Count: NO GROWTH
Culture: NO GROWTH
SPECIAL REQUESTS: NORMAL

## 2014-06-24 MED ORDER — ASPIRIN 81 MG PO CHEW: 81.0000 mg | CHEWABLE_TABLET | Freq: Every day | ORAL | Status: DC

## 2014-06-24 MED ORDER — LEVOFLOXACIN 500 MG PO TABS: 500.0000 mg | ORAL_TABLET | Freq: Every day | ORAL | Status: DC

## 2014-06-24 NOTE — Progress Notes (Signed)
Gave patient his am cozaar due to b/p 186/82. [facility] [employer]

## 2014-06-24 NOTE — Discharge Summary (Signed)
Physician Discharge Summary  Ronald [name] DXA:[id number] DOB: 02-Nov-1945 DOA: 06/22/2014  PCP: Celedonio Savage, MD  Admit date: 06/22/2014 Discharge date: 06/24/2014  Time spent: greater  Discharge Diagnoses:  Active Problems:   Chest pain at rest   Myocardial infarction   Diabetes mellitus   COPD (chronic obstructive pulmonary disease)   Tobacco abuse   Acute encephalopathy   Leucocytosis   Discharge Condition: stable  Filed Weights   06/22/14 1455 06/22/14 1937 06/24/14 0530  Weight: 95.255 kg (210 lb) 93.35 kg (205 lb 12.8 oz) 93.1 kg (205 lb 4 oz)    History of present illness:  69 year old obese male with history of type 2 diabetes mellitus (A1c of 6.9), COPD, hypertension, GERD, recent hospitalization at [facility] for an NST EMI and underwent cardiac cath with 2 DES placed to LAD for significant mid LAD stenosis. (He was discharged on aspirin and Plavix,), ongoing heavy tobacco use, history of TIA and ulcerative colitis who presented to the ED for substernal chest pain since his morning. Patient reports having posterior neck pain for the past 3 days with subjective chills and sweating. He reports sharp substernal chest pain that radiated to his neck without any aggravating or relieving factors. Today he felt very weak and needed help to get out of bed. He denies passing out, bowel or urinary incontinence. Also reports nonproductive cough and some shortness of breath. He denies any dizziness or lightheadedness. Denies any weakness or numbness. He reports feeling increasingly sleepy for the past 3 days. Appears sleepy during conversation and is a poor historian. Patient denies any headache or blurred vision. Denies dizziness or fever but reports chills. Denies nausea, vomiting, palpitations, shortness of breath, abdominal pain, bowel or urinary symptoms. . Denies change in weight or appetite.  Course in the ED Patient's blood pressure was low at 95/ 46 mmhg. He was afebrile  appeared lethargic. Blood work done showed significant leukocytosis with WBC of 20.6 K. Normal hemoglobin and hematocrit. Chemistry was unremarkable. Creatinine 1.33. Blood glucose was 120. Initial troponin was negative. Chest x-ray was unremarkable for any infiltrate. ABG done showed pH of 7.30, PCO2 of 54.9 and PO2 of 57. EKG shows normal sinus rhythm with PVCs and T-wave inversion in lateral leads. Patient denied any further chest pain in the ED. Hospitalist admission requested to telemetry.  Hospital Course:  Chest pain at rest: atypical. MI ruled out. Cardiology consulted and recommends no further workup Fever/leukocytosis, cough productive of brown and purulent sputum. Has rales right base, but CXR negative. Clinically concerning for pneumonia. Initially covered with vancomycin and zosyn. Repeat CXR negative.  Changed to levaquin for acute bronchitis  COPD (chronic obstructive pulmonary disease): no wheeze. Encourage to quit smoking  Tobacco abuse: smokes 1.5 ppd. counselled against.  Acute encephalopathy: related to pulmonary infection?  By discharge, at baseline, a and o Mild hypokalemia: corrected  Procedures:  none  Consultations:  cardiology  Discharge Exam: Filed Vitals:   06/24/14 0530  BP: 186/82  Pulse: 91  Temp: 97.6 F (36.4 C)  Resp: 18    General: a and o. Cardiovascular: RRR Respiratory: CTA without WRR Abd: s, nt, nd No CCE  Discharge Instructions   Discharge Instructions    Diet - low sodium heart healthy    Complete by:  As directed      Diet Carb Modified    Complete by:  As directed      Increase activity slowly    Complete by:  As directed  Current Discharge Medication List    START taking these medications   Details  aspirin 81 MG chewable tablet Chew 1 tablet (81 mg total) by mouth daily.    levofloxacin (LEVAQUIN) 500 MG tablet Take 1 tablet (500 mg total) by mouth daily. Qty: 4 tablet, Refills: 0      CONTINUE  these medications which have NOT CHANGED   Details  albuterol (PROVENTIL HFA;VENTOLIN HFA) 108 (90 BASE) MCG/ACT inhaler Inhale 2 puffs into the lungs every 6 (six) hours as needed for wheezing or shortness of breath.    amLODipine (NORVASC) 10 MG tablet Take 10 mg by mouth daily.    benzonatate (TESSALON) 100 MG capsule Take 100 mg by mouth 3 (three) times daily as needed for cough.    carvedilol (COREG) 25 MG tablet Take 25 mg by mouth 2 (two) times daily with a meal.    clopidogrel (PLAVIX) 75 MG tablet Take 75 mg by mouth daily.    glipiZIDE (GLUCOTROL XL) 2.5 MG 24 hr tablet Take 2.5 mg by mouth daily with breakfast.    hydrochlorothiazide (HYDRODIURIL) 25 MG tablet Take 25 mg by mouth daily.    isosorbide mononitrate (IMDUR) 30 MG 24 hr tablet Take 30 mg by mouth daily.    lisinopril (PRINIVIL,ZESTRIL) 20 MG tablet Take 20 mg by mouth daily.    lovastatin (MEVACOR) 20 MG tablet Take 20 mg by mouth at bedtime.    metFORMIN (GLUCOPHAGE-XR) 500 MG 24 hr tablet Take 1,000 mg by mouth daily with breakfast.    nitroGLYCERIN (NITROSTAT) 0.4 MG SL tablet Place 0.4 mg under the tongue every 5 (five) minutes as needed for chest pain.    pantoprazole (PROTONIX) 40 MG tablet Take 40 mg by mouth daily.    potassium chloride (K-DUR) 10 MEQ tablet Take 10 mEq by mouth daily.    pregabalin (LYRICA) 225 MG capsule Take 225 mg by mouth 2 (two) times daily.    sulfaSALAzine (AZULFIDINE) 500 MG tablet Take 500 mg by mouth 3 (three) times daily.       No Known Allergies    The results of significant diagnostics from this hospitalization (including imaging, microbiology, ancillary and laboratory) are listed below for reference.    Significant Diagnostic Studies: Dg Chest 2 View  06/23/2014   CLINICAL DATA:  Shortness of breath, pneumonia cough, fever  EXAM: CHEST  2 VIEW  COMPARISON:  06/22/2014  FINDINGS: Cardiomediastinal silhouette is stable. No acute infiltrate or pulmonary edema.  Minimal central bronchitic changes. Bony thorax is unremarkable. Mild hyperinflation.  IMPRESSION: No infiltrate or pulmonary edema. Mild hyperinflation. Minimal central bronchitic changes.   Electronically Signed   By: Lahoma Crocker M.D.   On: 06/23/2014 14:17   Dg Chest 2 View  06/22/2014   CLINICAL DATA:  Chest pain for 3 days.  Hypotension.  Dizziness.  EXAM: CHEST  2 VIEW  COMPARISON:  05/09/2014.  FINDINGS: Cardiopericardial silhouette is within normal limits for projection. Emphysema is present. There is flattening of the hemidiaphragms and enlargement of the retrosternal clear space. The costophrenic angles are excluded from view on the lateral. Aortic arch atherosclerosis. No airspace disease or pleural effusion is evident. No pneumothorax.  IMPRESSION: Emphysema without active cardiopulmonary disease.   Electronically Signed   By: Dereck Ligas M.D.   On: 06/22/2014 16:05   Ct Head Wo Contrast  06/22/2014   CLINICAL DATA:  Dizziness for 3 days.  Acute encephalopathy.  EXAM: CT HEAD WITHOUT CONTRAST  CT CERVICAL SPINE WITHOUT CONTRAST  TECHNIQUE: Multidetector CT imaging of the head and cervical spine was performed following the standard protocol without intravenous contrast. Multiplanar CT image reconstructions of the cervical spine were also generated.  COMPARISON:  Head CT -06/15/2009; 06/15/2024  FINDINGS: CT HEAD FINDINGS  Examination is degraded secondary to patient motion artifact necessitating the acquisition of additional images.  There is mild diffuse atrophy with sulcal prominence and prominence of the bifrontal extra-axial spaces. There is mild centralized volume loss with commensurate ex vacuo dilatation of the ventricular system. Scattered periventricular hypodensities compatible with microvascular ischemic disease. Right-sided basal ganglial calcifications. The gray-white differentiation is otherwise well maintained. No CT evidence of acute large territory infarct. Unchanged size and  configuration of the ventricles and basilar cisterns. No midline shift. Intracranial atherosclerosis.  Limited visualization of the paranasal sinuses and mastoid air cells is normal. No air-fluid levels. Regional soft tissues appear normal. No displaced calvarial fracture.  CT CERVICAL SPINE FINDINGS  C1 to the inferior endplate of T2 is imaged.  There is suboptimal evaluation of the caudal aspect of the cervical spine secondary to beam hardening artifact from the patient's overlying shoulders. Normal alignment of the cervical spine. No anterolisthesis or retrolisthesis. The bilateral facets are normally aligned. The dens is normally positioned between the lateral masses of C1. Normal atlantodental and atlantoaxial articulations. 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  No fracture or static subluxation of the cervical spine. Cervical vertebral body heights are preserved. Prevertebral soft tissues are normal.  Intervertebral disc space heights are preserved.  Atherosclerotic plaque within the bilateral carotid bulbs. Regional soft tissues appear otherwise normal. Limited visualization lung apices demonstrates centrilobular emphysematous change  IMPRESSION: 1. Mild atrophy and microvascular ischemic disease without definite superimposed acute intracranial process on this motion degraded examination. 2. No fracture or static subluxation of the cervical spine.   Electronically Signed   By: Sandi Mariscal M.D.   On: 06/22/2014 23:47   Ct Cervical Spine W Contrast  06/22/2014   CLINICAL DATA:  Dizziness for 3 days.  Acute encephalopathy.  EXAM: CT HEAD WITHOUT CONTRAST  CT CERVICAL SPINE WITHOUT CONTRAST  TECHNIQUE: Multidetector CT imaging of the head and cervical spine was performed following the standard protocol without intravenous contrast. Multiplanar CT image reconstructions of the cervical spine were also generated.  COMPARISON:  Head CT -06/15/2009;  06/15/2024  FINDINGS: CT HEAD FINDINGS  Examination is degraded secondary to patient motion artifact necessitating the acquisition of additional images.  There is mild diffuse atrophy with sulcal prominence and prominence of the bifrontal extra-axial spaces. There is mild centralized volume loss with commensurate ex vacuo dilatation of the ventricular system. Scattered periventricular hypodensities compatible with microvascular ischemic disease. Right-sided basal ganglial calcifications. The gray-white differentiation is otherwise well maintained. No CT evidence of acute large territory infarct. Unchanged size and configuration of the ventricles and basilar cisterns. No midline shift. Intracranial atherosclerosis.  Limited visualization of the paranasal sinuses and mastoid air cells is normal. No air-fluid levels. Regional soft tissues appear normal. No displaced calvarial fracture.  CT CERVICAL SPINE FINDINGS  C1 to the inferior endplate of T2 is imaged.  There is suboptimal evaluation of the caudal aspect of the cervical spine secondary to beam hardening artifact from the patient's overlying shoulders. Normal alignment of the cervical spine. No anterolisthesis or retrolisthesis. The bilateral facets are normally aligned. The dens is normally positioned between the lateral masses of C1. Normal atlantodental and atlantoaxial articulations. 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  No fracture or static subluxation of the cervical spine. Cervical vertebral body heights are preserved. Prevertebral soft tissues are normal.  Intervertebral disc space heights are preserved.  Atherosclerotic plaque within the bilateral carotid bulbs. Regional soft tissues appear otherwise normal. Limited visualization lung apices demonstrates centrilobular emphysematous change  IMPRESSION: 1. Mild atrophy and microvascular ischemic disease without definite superimposed acute  intracranial process on this motion degraded examination. 2. No fracture or static subluxation of the cervical spine.   Electronically Signed   By: Sandi Mariscal M.D.   On: 06/22/2014 23:47    Microbiology: Recent Results (from the past 240 hour(s))  Urine culture     Status: None   Collection Time: 06/22/14  7:18 PM  Result Value Ref Range Status   Specimen Description URINE, CLEAN CATCH  Final   Special Requests Normal  Final   Colony Count NO GROWTH Performed at [facility]   Final   Culture NO GROWTH Performed at [facility]   Final   Report Status 06/24/2014 FINAL  Final  Culture, blood (routine x 2)     Status: None (Preliminary result)   Collection Time: 06/22/14 10:31 PM  Result Value Ref Range Status   Specimen Description BLOOD LEFT ARM  Final   Special Requests BOTTLES DRAWN AEROBIC AND ANAEROBIC 10CC  Final   Culture   Final           BLOOD CULTURE RECEIVED NO GROWTH TO DATE CULTURE WILL BE HELD FOR 5 DAYS BEFORE ISSUING A FINAL NEGATIVE REPORT Note: Culture results may be compromised due to an excessive volume of blood received in culture bottles. Performed at [facility]    Report Status PENDING  Incomplete  Culture, blood (routine x 2)     Status: None (Preliminary result)   Collection Time: 06/22/14 10:40 PM  Result Value Ref Range Status   Specimen Description BLOOD RIGHT ARM  Final   Special Requests BOTTLES DRAWN AEROBIC AND ANAEROBIC 10CC  Final   Culture   Final           BLOOD CULTURE RECEIVED NO GROWTH TO DATE CULTURE WILL BE HELD FOR 5 DAYS BEFORE ISSUING A FINAL NEGATIVE REPORT Note: Culture results may be compromised due to an excessive volume of blood received in culture bottles. Performed at [facility]    Report Status PENDING  Incomplete     Labs: Basic Metabolic Panel:  Recent Labs Lab 06/22/14 1520 06/23/14 0140  NA 139 136  K 4.4 3.4*  CL 101 99  CO2 29 27  GLUCOSE 120* 188*  BUN 17 18  CREATININE  1.33 1.15  CALCIUM 8.5 8.4   Liver Function Tests:  Recent Labs Lab 06/22/14 2231 06/23/14 0140  AST 21 18  ALT 22 18  ALKPHOS 79 66  BILITOT 0.7 0.9  PROT 7.7 6.5  ALBUMIN 3.8 3.4*   No results for input(s): LIPASE, AMYLASE in the last 168 hours.  Recent Labs Lab 06/22/14 2231  AMMONIA 31   CBC:  Recent Labs Lab 06/22/14 1520 06/23/14 0140 06/24/14 0520  WBC 20.6* 17.4* 12.9*  NEUTROABS  --  14.3* 8.9*  HGB 14.2 14.1 14.7  HCT 42.3 41.9 42.4  MCV 87.0 87.3 87.4  PLT 283 273 322   Cardiac Enzymes:  Recent Labs Lab 06/22/14 1520 06/22/14 2231 06/23/14 0140 06/23/14 0819  TROPONINI 0.03 <0.03 0.03  0.04*   BNP: BNP (last 3 results) No results for input(s): PROBNP in the last 8760 hours. CBG:  Recent Labs Lab 06/23/14 1123 06/23/14 1634 06/23/14 2118 06/24/14 0803 06/24/14 1145  GLUCAP 157* 138* 160* 151* 203*       Signed:  Fauna Neuner L  [facility] 06/24/2014, 12:34 PM

## 2014-06-27 ENCOUNTER — Encounter (HOSPITAL_COMMUNITY): Payer: Self-pay

## 2014-06-28 ENCOUNTER — Ambulatory Visit (INDEPENDENT_AMBULATORY_CARE_PROVIDER_SITE_OTHER): Payer: Medicare Other | Admitting: Cardiovascular Disease

## 2014-06-28 ENCOUNTER — Encounter: Payer: Self-pay | Admitting: Cardiovascular Disease

## 2014-06-28 VITALS — BP 103/66 | HR 91 | Ht 69.0 in | Wt 201.0 lb

## 2014-06-28 DIAGNOSIS — I252 Old myocardial infarction: Secondary | ICD-10-CM

## 2014-06-28 DIAGNOSIS — E785 Hyperlipidemia, unspecified: Secondary | ICD-10-CM

## 2014-06-28 DIAGNOSIS — I10 Essential (primary) hypertension: Secondary | ICD-10-CM

## 2014-06-28 DIAGNOSIS — Z716 Tobacco abuse counseling: Secondary | ICD-10-CM

## 2014-06-28 DIAGNOSIS — Z87898 Personal history of other specified conditions: Secondary | ICD-10-CM

## 2014-06-28 DIAGNOSIS — Z955 Presence of coronary angioplasty implant and graft: Secondary | ICD-10-CM

## 2014-06-28 DIAGNOSIS — Z9289 Personal history of other medical treatment: Secondary | ICD-10-CM

## 2014-06-28 DIAGNOSIS — I2510 Atherosclerotic heart disease of native coronary artery without angina pectoris: Secondary | ICD-10-CM

## 2014-06-28 LAB — RESPIRATORY VIRUS PANEL
Adenovirus: NEGATIVE
INFLUENZA A: NEGATIVE
INFLUENZA B 1: NEGATIVE
Metapneumovirus: NEGATIVE
PARAINFLUENZA 1 A: NEGATIVE
PARAINFLUENZA 2 A: NEGATIVE
Parainfluenza 3: NEGATIVE
RESPIRATORY SYNCYTIAL VIRUS A: NEGATIVE
RESPIRATORY SYNCYTIAL VIRUS B: NEGATIVE
RHINOVIRUS: NEGATIVE

## 2014-06-28 MED ORDER — BENZONATATE 100 MG PO CAPS: 100.0000 mg | ORAL_CAPSULE | Freq: Three times a day (TID) | ORAL | Status: DC | PRN

## 2014-06-28 MED ORDER — ATORVASTATIN CALCIUM 40 MG PO TABS: 40.0000 mg | ORAL_TABLET | Freq: Every day | ORAL | Status: DC

## 2014-06-28 NOTE — Addendum Note (Signed)
Addended by: Julian Hy T on: 06/28/2014 04:38 PM   Modules accepted: Orders

## 2014-06-28 NOTE — Patient Instructions (Signed)
Your physician recommends that you schedule a follow-up appointment in: 3 months with Dr. Bronson Ing  Your physician has recommended you make the following change in your medication:   STOP LOVASTATIN   START LIPITOR 40 MG DAILY  Your physician recommends that you return for lab work in [place] NEXT OFFICE VISIT L/L'S  Thank you for choosing [facility]!!

## 2014-06-28 NOTE — Progress Notes (Signed)
Patient ID: Ronald [name], male   DOB: April 20, 1946, 69 y.o.   MRN: [id number]      SUBJECTIVE: The patient is a 69 yr old male recently evaluated by Dr. Stanford Breed at [facility], where he was hospitalized for atypical chest pain. He has a past medical history of diabetes mellitus, hypertension, hyperlipidemia, ulcerative colitis (sees Dr. Laural Golden, GI), tobacco abuse, and coronary artery disease. He was hospitalized at [facility] with a non-ST elevation myocardial infarction. He underwent cardiac catheterization with 2 drug-eluting stents to his LAD. Echocardiogram showed normal LV function and mild tricuspid regurgitation. He has chronic dyspnea on exertion from COPD and denies orthopnea and PND. He has had occasional chest tightness for years not related to activities. This did not change following stent placement. He also has a nonproductive cough since December. Labs performed at [facility] on 04/22/2014 demonstrated total cholesterol 198, triglycerides 271, HDL 28, LDL 116, HbA1c 6.6%. He denies chest pain and palpitations. He continues to have a cough and smokes 1.5 ppd. Denies bleeding problems. Thinks he's been on lovastatin for a long time. Due to commence cardiac rehab next month.   Review of Systems: As per "subjective", otherwise negative.  No Known Allergies  Current Outpatient Prescriptions  Medication Sig Dispense Refill  . albuterol (PROVENTIL HFA;VENTOLIN HFA) 108 (90 BASE) MCG/ACT inhaler Inhale 2 puffs into the lungs every 6 (six) hours as needed for wheezing or shortness of breath.    Marland Kitchen amLODipine (NORVASC) 10 MG tablet Take 10 mg by mouth daily.    Marland Kitchen aspirin 81 MG chewable tablet Chew 1 tablet (81 mg total) by mouth daily.    . benzonatate (TESSALON) 100 MG capsule Take 100 mg by mouth 3 (three) times daily as needed for cough.    . carvedilol (COREG) 25 MG tablet Take 25 mg by mouth 2 (two) times daily with a meal.    . clopidogrel (PLAVIX) 75 MG tablet Take 75 mg by mouth  daily.    Marland Kitchen glipiZIDE (GLUCOTROL XL) 2.5 MG 24 hr tablet Take 2.5 mg by mouth daily with breakfast.    . hydrochlorothiazide (HYDRODIURIL) 25 MG tablet Take 25 mg by mouth daily.    . isosorbide mononitrate (IMDUR) 30 MG 24 hr tablet Take 30 mg by mouth daily.    Marland Kitchen levofloxacin (LEVAQUIN) 500 MG tablet Take 1 tablet (500 mg total) by mouth daily. 4 tablet 0  . lisinopril (PRINIVIL,ZESTRIL) 20 MG tablet Take 20 mg by mouth daily.    Marland Kitchen lovastatin (MEVACOR) 20 MG tablet Take 20 mg by mouth at bedtime.    . metFORMIN (GLUCOPHAGE-XR) 500 MG 24 hr tablet Take 1,000 mg by mouth daily with breakfast.    . nitroGLYCERIN (NITROSTAT) 0.4 MG SL tablet Place 0.4 mg under the tongue every 5 (five) minutes as needed for chest pain.    . pantoprazole (PROTONIX) 40 MG tablet Take 40 mg by mouth daily.    . potassium chloride (K-DUR) 10 MEQ tablet Take 10 mEq by mouth daily.    . pregabalin (LYRICA) 225 MG capsule Take 225 mg by mouth 2 (two) times daily.    Marland Kitchen sulfaSALAzine (AZULFIDINE) 500 MG tablet Take 500 mg by mouth 3 (three) times daily.     No current facility-administered medications for this visit.    Past Medical History  Diagnosis Date  . Ulcerative colitis   . Hypertension   . Asthma   . COPD (chronic obstructive pulmonary disease)   . GERD (gastroesophageal reflux disease)   .  Type II diabetes mellitus   . Coronary artery disease   . TIA (transient ischemic attack)     "they say I've had some mini strokes; don't know when"; denies residual on 06/22/2014)  . High cholesterol   . NSTEMI (non-ST elevated myocardial infarction) 05/2014    with stent placement  . Chronic lower back pain   . Anxiety   . Depression     Past Surgical History  Procedure Laterality Date  . Appendectomy    . Cholecystectomy    . Tumor excision Right ~ 1999    "side of my upper head"  . Coronary angioplasty with stent placement  05/2014    "2"  . Cardiac catheterization  1990's X 3    History   Social  History  . Marital Status: Divorced    Spouse Name: N/A    Number of Children: 2  . Years of Education: N/A   Occupational History  . Not on file.   Social History Main Topics  . Smoking status: Current Every Day Smoker -- 1.50 packs/day for 48 years    Types: Cigarettes  . Smokeless tobacco: Never Used  . Alcohol Use: Yes     Comment: "drank some when I was 18"  . Drug Use: No  . Sexual Activity: No   Other Topics Concern  . Not on file   Social History Narrative    BP 103/66  Pulse 91 Weight 201 lb (91.173 kg) Height 5\' 9"  (1.753 m)    PHYSICAL EXAM General: NAD HEENT: Normal. Neck: No JVD, no thyromegaly. Lungs: Coarse sounds b/l with insp/exp wheezes. No rales. CV: Nondisplaced PMI.  Regular rate and rhythm, normal S1/S2, no S3/S4, no murmur. No pretibial or periankle edema.  No carotid bruit.    Abdomen: Soft, nontender, no distention.  Neurologic: Alert and oriented x 3.  Psych: Normal affect. Skin: Normal. Musculoskeletal: Normal range of motion, no gross deformities. Extremities: No clubbing or cyanosis.   ECG: Most recent ECG reviewed.      ASSESSMENT AND PLAN: 1. CAD s/p PCI to LAD for NSTEMI: Symptomatically stable. Continue ASA, Coreg, Plavix, Imdur, and statin, but will switch to Lipitor 40 mg. 2. Essential HTN: Well controlled on current therapy which includes HCTZ, lisinopril, and amlodipine. No changes. 3. Hyperlipidemia: Results noted above. Will switch to atorvastatin 40 mg. Repeat lipids and LFT's in 3 months. 4. Tobacco abuse: Extensive cessation counseling provided.  Dispo: f/u 6 months.  Time spent: 40 minutes, of which >50% answering patient's questions regarding medications and cardiac rehab.  Kate Sable, M.D., F.A.C.C.

## 2014-06-29 LAB — CULTURE, BLOOD (ROUTINE X 2)
CULTURE: NO GROWTH
Culture: NO GROWTH

## 2014-07-06 ENCOUNTER — Telehealth: Payer: Self-pay | Admitting: Cardiovascular Disease

## 2014-07-06 NOTE — Telephone Encounter (Signed)
Since Mr. Ronald [name] had  heart attack in December and then the bad infection in his lungs, he can't wear his seatbelt. It completely cuts his breath out and he can't breath. He said that [facility] a form they can fill out now that exempts you from having to wear and it is registered in [place].   It is keeping him from driving.Ronald [name] is requesting to see if Dr, Bronson Ing is aware of this form and if so can we complete one for him.

## 2014-07-07 NOTE — Telephone Encounter (Signed)
Left message to return call 

## 2014-07-07 NOTE — Telephone Encounter (Signed)
Patient informed that we typically do not give any exemptions to patient for wearing seat belt.  Ultimately left up to doctors discretion.  Suggested he check with his PMD and / or if he sees pulmonologist that feels he has diagnosis that would warrant not wearing seat belt - that provider could make that decision.  Patient verbalized understanding.

## 2014-07-12 ENCOUNTER — Encounter (HOSPITAL_COMMUNITY): Payer: Self-pay | Admitting: Cardiology

## 2014-07-12 ENCOUNTER — Emergency Department (HOSPITAL_COMMUNITY): Payer: Medicare Other

## 2014-07-12 ENCOUNTER — Emergency Department (HOSPITAL_COMMUNITY)
Admission: EM | Admit: 2014-07-12 | Discharge: 2014-07-12 | Disposition: A | Discharge status: Home / Self Care | Payer: Medicare Other | Attending: Emergency Medicine | Admitting: Emergency Medicine

## 2014-07-12 DIAGNOSIS — I2510 Atherosclerotic heart disease of native coronary artery without angina pectoris: Secondary | ICD-10-CM | POA: Diagnosis not present

## 2014-07-12 DIAGNOSIS — K219 Gastro-esophageal reflux disease without esophagitis: Secondary | ICD-10-CM | POA: Insufficient documentation

## 2014-07-12 DIAGNOSIS — F329 Major depressive disorder, single episode, unspecified: Secondary | ICD-10-CM | POA: Diagnosis not present

## 2014-07-12 DIAGNOSIS — Z7982 Long term (current) use of aspirin: Secondary | ICD-10-CM | POA: Diagnosis not present

## 2014-07-12 DIAGNOSIS — E119 Type 2 diabetes mellitus without complications: Secondary | ICD-10-CM | POA: Insufficient documentation

## 2014-07-12 DIAGNOSIS — Z9889 Other specified postprocedural states: Secondary | ICD-10-CM | POA: Insufficient documentation

## 2014-07-12 DIAGNOSIS — I252 Old myocardial infarction: Secondary | ICD-10-CM | POA: Insufficient documentation

## 2014-07-12 DIAGNOSIS — Z79899 Other long term (current) drug therapy: Secondary | ICD-10-CM | POA: Insufficient documentation

## 2014-07-12 DIAGNOSIS — Z720 Tobacco use: Secondary | ICD-10-CM | POA: Diagnosis not present

## 2014-07-12 DIAGNOSIS — R55 Syncope and collapse: Secondary | ICD-10-CM | POA: Diagnosis not present

## 2014-07-12 DIAGNOSIS — Z7951 Long term (current) use of inhaled steroids: Secondary | ICD-10-CM | POA: Insufficient documentation

## 2014-07-12 DIAGNOSIS — F419 Anxiety disorder, unspecified: Secondary | ICD-10-CM | POA: Insufficient documentation

## 2014-07-12 DIAGNOSIS — Z9861 Coronary angioplasty status: Secondary | ICD-10-CM | POA: Insufficient documentation

## 2014-07-12 DIAGNOSIS — Z8673 Personal history of transient ischemic attack (TIA), and cerebral infarction without residual deficits: Secondary | ICD-10-CM | POA: Diagnosis not present

## 2014-07-12 DIAGNOSIS — J441 Chronic obstructive pulmonary disease with (acute) exacerbation: Secondary | ICD-10-CM | POA: Insufficient documentation

## 2014-07-12 DIAGNOSIS — Z7902 Long term (current) use of antithrombotics/antiplatelets: Secondary | ICD-10-CM | POA: Insufficient documentation

## 2014-07-12 DIAGNOSIS — E782 Mixed hyperlipidemia: Secondary | ICD-10-CM | POA: Diagnosis not present

## 2014-07-12 DIAGNOSIS — G8929 Other chronic pain: Secondary | ICD-10-CM | POA: Insufficient documentation

## 2014-07-12 DIAGNOSIS — I10 Essential (primary) hypertension: Secondary | ICD-10-CM | POA: Diagnosis not present

## 2014-07-12 LAB — BASIC METABOLIC PANEL
Anion gap: 6 (ref 5–15)
BUN: 24 mg/dL — AB (ref 6–23)
CO2: 31 mmol/L (ref 19–32)
Calcium: 9.1 mg/dL (ref 8.4–10.5)
Chloride: 99 mmol/L (ref 96–112)
Creatinine, Ser: 1.17 mg/dL (ref 0.50–1.35)
GFR, EST AFRICAN AMERICAN: 73 mL/min — AB (ref >90)
GFR, EST NON AFRICAN AMERICAN: 63 mL/min — AB (ref >90)
Glucose, Bld: 119 mg/dL — ABNORMAL HIGH (ref 70–99)
Potassium: 4.5 mmol/L (ref 3.5–5.1)
SODIUM: 136 mmol/L (ref 135–145)

## 2014-07-12 LAB — CBC WITH DIFFERENTIAL/PLATELET
Basophils Absolute: 0.1 10*3/uL (ref 0.0–0.1)
Basophils Relative: 0 % (ref 0–1)
Eosinophils Absolute: 0.2 10*3/uL (ref 0.0–0.7)
Eosinophils Relative: 1 % (ref 0–5)
HCT: 43.3 % (ref 39.0–52.0)
Hemoglobin: 14.3 g/dL (ref 13.0–17.0)
LYMPHS PCT: 15 % (ref 12–46)
Lymphs Abs: 1.9 10*3/uL (ref 0.7–4.0)
MCH: 29.6 pg (ref 26.0–34.0)
MCHC: 33 g/dL (ref 30.0–36.0)
MCV: 89.6 fL (ref 78.0–100.0)
MONO ABS: 1 10*3/uL (ref 0.1–1.0)
MONOS PCT: 8 % (ref 3–12)
Neutro Abs: 10.2 10*3/uL — ABNORMAL HIGH (ref 1.7–7.7)
Neutrophils Relative %: 76 % (ref 43–77)
Platelets: 391 10*3/uL (ref 150–400)
RBC: 4.83 MIL/uL (ref 4.22–5.81)
RDW: 13.4 % (ref 11.5–15.5)
WBC: 13.3 10*3/uL — AB (ref 4.0–10.5)

## 2014-07-12 MED ORDER — LISINOPRIL 10 MG PO TABS: 10.0000 mg | ORAL_TABLET | Freq: Every day | ORAL | Status: DC

## 2014-07-12 MED ORDER — AMLODIPINE BESYLATE 5 MG PO TABS: 5.0000 mg | ORAL_TABLET | Freq: Every day | ORAL | Status: DC

## 2014-07-12 MED ORDER — IPRATROPIUM-ALBUTEROL 0.5-2.5 (3) MG/3ML IN SOLN
3.0000 mL | Freq: Once | RESPIRATORY_TRACT | Status: AC
  Administered 2014-07-12: 3 mL via RESPIRATORY_TRACT
  Filled 2014-07-12: qty 3

## 2014-07-12 MED ORDER — SODIUM CHLORIDE 0.9 % IV BOLUS (SEPSIS)
500.0000 mL | Freq: Once | INTRAVENOUS | Status: AC
  Administered 2014-07-12: 500 mL via INTRAVENOUS

## 2014-07-12 MED ORDER — METHYLPREDNISOLONE SODIUM SUCC 125 MG IJ SOLR
125.0000 mg | Freq: Once | INTRAMUSCULAR | Status: AC
  Administered 2014-07-12: 125 mg via INTRAVENOUS
  Filled 2014-07-12: qty 2

## 2014-07-12 NOTE — Discharge Instructions (Signed)
Recommend changing lisinopril to 10 mg daily and amlodipine (Norvasc) 5 mg daily.  Follow-up your primary care doctor

## 2014-07-12 NOTE — ED Provider Notes (Signed)
CSN: [id number]     Arrival date & time 07/12/14  1246 History  This chart was scribed for Nat Christen, MD by Stephania Fragmin, ED Scribe. This patient was seen in room APA09/APA09 and the patient's care was started at 1:30 PM.    Chief Complaint  Patient presents with  . Loss of Consciousness   The history is provided by the patient and a relative. No language interpreter was used.     HPI Comments: NYMIR RINGLER is a 69 y.o. male with a history of COPD and 2 coronary stents recently placed brought in by ambulance, who presents to the Emergency Department complaining of a syncopal episode witnessed by a family member preceded by a large coughing spell. Per family member, the same thing happened yesterday. Per family member, he has been having low blood pressure since having his stent placed in about 2 months ago, and patient has been in and out of the hospital for the same reason since. Patient has been complaining of tiredness in the same timeframe. Patient denies recent illness. He is a PPD smoker.  Dr. Wenda Overland in [place] is patient's PCP, and his cardiologist is at [facility].   Past Medical History  Diagnosis Date  . Ulcerative colitis   . Hypertension   . Asthma   . COPD (chronic obstructive pulmonary disease)   . GERD (gastroesophageal reflux disease)   . Type II diabetes mellitus   . Coronary artery disease   . TIA (transient ischemic attack)     "they say I've had some mini strokes; don't know when"; denies residual on 06/22/2014)  . High cholesterol   . NSTEMI (non-ST elevated myocardial infarction) 05/2014    with stent placement  . Chronic lower back pain   . Anxiety   . Depression    Past Surgical History  Procedure Laterality Date  . Appendectomy    . Cholecystectomy    . Tumor excision Right ~ 1999    "side of my upper head"  . Coronary angioplasty with stent placement  05/2014    "2"  . Cardiac catheterization  1990's X 3   Family History  Problem Relation Age of  Onset  . CAD Father    History  Substance Use Topics  . Smoking status: Current Every Day Smoker -- 1.50 packs/day for 48 years    Types: Cigarettes    Start date: 02/12/1966  . Smokeless tobacco: Never Used  . Alcohol Use: 0.0 oz/week    0 Not specified per week     Comment: "drank some when I was 18"    Review of Systems  Constitutional: Positive for fatigue.  Respiratory: Positive for cough.   Neurological: Positive for syncope.  All other systems reviewed and are negative.     Allergies  Review of patient's allergies indicates no known allergies.  Home Medications   Prior to Admission medications   Medication Sig Start Date End Date Taking? Authorizing Provider  ALPRAZolam Duanne Moron) 1 MG tablet Take 1 mg by mouth 3 (three) times daily as needed for anxiety.  06/16/14  Yes Historical Provider, MD  aspirin 81 MG chewable tablet Chew 1 tablet (81 mg total) by mouth daily. 06/24/14  Yes Delfina Redwood, MD  atorvastatin (LIPITOR) 40 MG tablet Take 1 tablet (40 mg total) by mouth daily. 06/28/14  Yes Herminio Commons, MD  benzonatate (TESSALON PERLES) 100 MG capsule Take 1 capsule (100 mg total) by mouth 3 (three) times daily as needed for  cough. 06/28/14  Yes Herminio Commons, MD  carvedilol (COREG) 25 MG tablet Take 25 mg by mouth 2 (two) times daily with a meal.   Yes Historical Provider, MD  clopidogrel (PLAVIX) 75 MG tablet Take 75 mg by mouth daily.   Yes Historical Provider, MD  Fluticasone-Salmeterol (ADVAIR) 100-50 MCG/DOSE AEPB Inhale 1 puff into the lungs 2 (two) times daily. 05/11/14 05/11/15 Yes Historical Provider, MD  glipiZIDE (GLUCOTROL XL) 2.5 MG 24 hr tablet Take 2.5 mg by mouth daily with breakfast.   Yes Historical Provider, MD  hydrochlorothiazide (HYDRODIURIL) 25 MG tablet Take 25 mg by mouth daily.   Yes Historical Provider, MD  HYDROcodone-acetaminophen (NORCO) 10-325 MG per tablet Take 1 tablet by mouth 4 (four) times daily.  06/17/14  Yes Historical  Provider, MD  isosorbide mononitrate (IMDUR) 30 MG 24 hr tablet Take 30 mg by mouth daily.   Yes Historical Provider, MD  metFORMIN (GLUCOPHAGE-XR) 500 MG 24 hr tablet Take 1,000 mg by mouth daily with breakfast.   Yes Historical Provider, MD  pantoprazole (PROTONIX) 40 MG tablet Take 40 mg by mouth daily.   Yes Historical Provider, MD  potassium chloride (K-DUR) 10 MEQ tablet Take 10 mEq by mouth daily.   Yes Historical Provider, MD  pregabalin (LYRICA) 225 MG capsule Take 225 mg by mouth 2 (two) times daily.   Yes Historical Provider, MD  sulfaSALAzine (AZULFIDINE) 500 MG tablet Take 1,000 mg by mouth 3 (three) times daily.    Yes Historical Provider, MD  albuterol (PROVENTIL HFA;VENTOLIN HFA) 108 (90 BASE) MCG/ACT inhaler Inhale 2 puffs into the lungs every 6 (six) hours as needed for wheezing or shortness of breath.    Historical Provider, MD  amLODipine (NORVASC) 5 MG tablet Take 1 tablet (5 mg total) by mouth daily. 07/12/14   Nat Christen, MD  lisinopril (PRINIVIL) 10 MG tablet Take 1 tablet (10 mg total) by mouth daily. 07/12/14   Nat Christen, MD  nitroGLYCERIN (NITROSTAT) 0.4 MG SL tablet Place 0.4 mg under the tongue every 5 (five) minutes as needed for chest pain.    Historical Provider, MD   BP 123/60 mmHg  Pulse 70  Temp(Src) 97.7 F (36.5 C) (Oral)  Resp 20  Ht 5\' 9"  (1.753 m)  Wt 201 lb (91.173 kg)  BMI 29.67 kg/m2  SpO2 97% Physical Exam  Constitutional: He is oriented to person, place, and time. He appears well-developed and well-nourished.  Alert.  HENT:  Head: Normocephalic and atraumatic.  Eyes: Conjunctivae and EOM are normal. Pupils are equal, round, and reactive to light.  Neck: Normal range of motion. Neck supple.  Cardiovascular: Normal rate and regular rhythm.   Pulmonary/Chest: Effort normal. He has wheezes. He has rhonchi.  Wheezing and rhonchi noted bilaterally.  Abdominal: Soft. Bowel sounds are normal.  Musculoskeletal: Normal range of motion.  Neurological:  He is alert and oriented to person, place, and time.  Moving all extremities well.  Skin: Skin is warm and dry.  Psychiatric: He has a normal mood and affect. His behavior is normal.  Nursing note and vitals reviewed.   ED Course  Procedures (including critical care time)  DIAGNOSTIC STUDIES: Oxygen Saturation is 91% on room air, adequate by my interpretation.    COORDINATION OF CARE: 1:37 PM - Discussed treatment plan with pt at bedside which includes CXR, blood tests, and breathing treatment, and pt agreed to plan.    Labs Review Labs Reviewed  BASIC METABOLIC PANEL - Abnormal; Notable for the  following:    Glucose, Bld 119 (*)    BUN 24 (*)    GFR calc non Af Amer 63 (*)    GFR calc Af Amer 73 (*)    All other components within normal limits  CBC WITH DIFFERENTIAL/PLATELET - Abnormal; Notable for the following:    WBC 13.3 (*)    Neutro Abs 10.2 (*)    All other components within normal limits    Imaging Review Dg Chest 2 View  07/12/2014   CLINICAL DATA:  69 year old male with shortness of breath and syncope. Initial encounter.  EXAM: CHEST  2 VIEW  COMPARISON:  06/23/2014 and earlier.  FINDINGS: Chronic large lung volumes. Stable cardiac size and mediastinal contours. Visualized tracheal air column is within normal limits. No pneumothorax, pulmonary edema, pleural effusion or acute pulmonary opacity identified. No acute osseous abnormality identified.  IMPRESSION: Chronic hyperinflation.  No acute cardiopulmonary abnormality.   Electronically Signed   By: Lars Pinks M.D.   On: 07/12/2014 15:40     EKG Interpretation   Date/Time:  Wednesday July 12 2014 12:55:30 EST Ventricular Rate:  75 PR Interval:  173 QRS Duration: 100 QT Interval:  371 QTC Calculation: 414 R Axis:   79 Text Interpretation:  Sinus rhythm Probable left ventricular hypertrophy  Confirmed by Beau Fanny  MD, DOUGLAS ([id number]) on 07/12/2014 1:46:24 PM      MDM   Final diagnoses:  Syncope, unspecified  syncope type   Patient has multiple health problems. He is on 5 different blood pressure medications. I suspect part of his syncope is related to hypotension.  He feels normal in the emergency department.  I recommended decreasing lisinopril to 10 mg daily Norvasc 5 mg daily and continue other medications. This was discussed with patient and his son and daughter-in-law. They all agree.  I personally performed the services described in this documentation, which was scribed in my presence. The recorded information has been reviewed and is accurate.      Nat Christen, MD 07/13/14 1135

## 2014-07-12 NOTE — ED Notes (Signed)
Syncopal episode.  Witnessed by family.  Alert when EMS arrived.  B/p 80/50. Not orthostatic with EMS.  Had 300 ml bolus.

## 2014-07-18 ENCOUNTER — Encounter (HOSPITAL_COMMUNITY): Admission: RE | Admit: 2014-07-18 | Payer: Medicare Other | Source: Ambulatory Visit

## 2014-07-18 ENCOUNTER — Encounter: Payer: Self-pay | Admitting: Internal Medicine

## 2014-07-18 ENCOUNTER — Ambulatory Visit (INDEPENDENT_AMBULATORY_CARE_PROVIDER_SITE_OTHER): Payer: Medicare Other | Admitting: Internal Medicine

## 2014-07-18 VITALS — BP 84/60 | HR 74 | Ht 69.0 in | Wt 205.0 lb

## 2014-07-18 DIAGNOSIS — J9611 Chronic respiratory failure with hypoxia: Secondary | ICD-10-CM

## 2014-07-18 DIAGNOSIS — Z720 Tobacco use: Secondary | ICD-10-CM

## 2014-07-18 DIAGNOSIS — I10 Essential (primary) hypertension: Secondary | ICD-10-CM

## 2014-07-18 DIAGNOSIS — F17210 Nicotine dependence, cigarettes, uncomplicated: Secondary | ICD-10-CM | POA: Diagnosis not present

## 2014-07-18 DIAGNOSIS — J449 Chronic obstructive pulmonary disease, unspecified: Secondary | ICD-10-CM | POA: Diagnosis not present

## 2014-07-18 MED ORDER — NEBIVOLOL HCL 10 MG PO TABS: 10.0000 mg | ORAL_TABLET | Freq: Every day | ORAL | Status: DC

## 2014-07-18 NOTE — Patient Instructions (Addendum)
Stop lisinopril, advair, and coreg  bisoprolol 10 mg daily   Pantoprazole(protonix) Take 30-60 min before first meal of the day   Only use your albuterol (proair)  as a rescue medication to be used if you can't catch your breath by resting or doing a relaxed purse lip breathing pattern.  - The less you use it, the better it will work when you need it. - Ok to use up to 2 puffs  every 4 hours if you must but call for immediate appointment if use goes up over your usual need - Don't leave home without it !!  (think of it like the spare tire for your car)   Wear the 02 as much as possible but do not smoke around 02  See Tammy NP  In one week  with all your medications, even over the counter meds, separated in two separate bags, the ones you take no matter what vs the ones you stop once you feel better and take only as needed when you feel you need them.   Tammy  will generate for you a new user friendly medication calendar that will put [place] all on the same page re: your medication use.     Without this process, it simply isn't possible to assure that we are providing  your outpatient care  with  the attention to detail we feel you deserve.   If we cannot assure that you're getting that kind of care,  then we cannot manage your problem effectively from this clinic.  Once you have seen Tammy and we are sure that we're all on the same page with your medication use she will arrange follow up with me.  Needs bnp/ recheck bmet on return

## 2014-07-18 NOTE — Progress Notes (Signed)
Subjective:    Patient ID: Ronald [name], male    DOB: December 04, 1945,    MRN: [id number]  HPI  58 yowm active smoker with onset sob x 2014 referred by Dr Wenda Overland  To pulmonary clinic 07/18/2014 for cough/ copd/new hypoxemia.  07/18/2014 1st [facility] office visit/ Ronald [name]   Chief Complaint  Patient presents with  . Pulmonary Consult    Referred by Dr. Wenda Overland. Pt c/o cough and SOB for the past 2-3 yrs. He states that he "passes out when I get to coughing and can't quit"- for the past 3-4 months. He states that he gets SOB with or without exertion.  Pt's sats on RA at rest are 87%--this increased to 94%2lpm continuous o2.   bad cough starting in spells x 2 years much worse since thanksgiving 2015 assoc with new "chest pressure"  > [facility] where underwent cards eval with relief of chest pressure but not the cough   On advair and using proair bid but poor hfa (see a/p)  Cough is day >> noct dry > wet with recurrent syncope assoc with sense of nasal congestion/pnds but no excess/ purulent secretions   No obvious other patterns in day to day or daytime variabilty or assoc   or cp or chest tightness, subjective wheeze overt hb symptoms. No unusual exp hx or h/o childhood pna/ asthma or knowledge of premature birth.  Sleeping ok without nocturnal  or early am exacerbation  of respiratory  c/o's or need for noct saba. Also denies any obvious fluctuation of symptoms with weather or environmental changes or other aggravating or alleviating factors except as outlined above   Current Medications, Allergies, Complete Past Medical History, Past Surgical History, Family History, and Social History were reviewed in [employer] record.                Review of Systems  Constitutional: Negative for fever, chills, activity change, appetite change and unexpected weight change.  HENT: Positive for congestion, sneezing and trouble swallowing. Negative for dental problem,  postnasal drip, rhinorrhea, sore throat and voice change.   Eyes: Negative for visual disturbance.  Respiratory: Positive for cough and shortness of breath. Negative for choking.   Cardiovascular: Negative for chest pain and leg swelling.  Gastrointestinal: Negative for nausea, vomiting and abdominal pain.  Genitourinary: Negative for difficulty urinating.  Musculoskeletal: Positive for arthralgias.  Skin: Negative for rash.  Psychiatric/Behavioral: Negative for behavioral problems and confusion.       Objective:   Physical Exam Chronically ill elederly wm nad  Wt Readings from Last 3 Encounters:  07/18/14 205 lb (92.987 kg)  07/12/14 201 lb (91.173 kg)  06/28/14 201 lb (91.173 kg)    Vital signs reviewed   HEENT mild turbinate edema.  Oropharynx no thrush or excess pnd or cobblestoning.  No JVD or cervical adenopathy. Mild accessory muscle hypertrophy. Trachea midline, nl thryroid. Chest was hyperinflated by percussion with diminished breath sounds and moderate increased exp time without wheeze. Hoover sign positive at mid inspiration. Regular rate and rhythm without murmur gallop or rub or increase P2 or edema.  Abd: no hsm, nl excursion. Ext warm without cyanosis or clubbing.    CXR:  07/12/14  I personally reviewed images and agree with radiology impression as follows:   Chronic hyperinflation. No acute cardiopulmonary abnormality.    Chemistry      Component Value Date/Time   NA 136 07/12/2014 1425   K 4.5 07/12/2014 1425   CL  99 07/12/2014 1425   CO2 31 07/12/2014 1425   BUN 24* 07/12/2014 1425   CREATININE 1.17 07/12/2014 1425      Component Value Date/Time   CALCIUM 9.1 07/12/2014 1425   ALKPHOS 66 06/23/2014 0140   AST 18 06/23/2014 0140   ALT 18 06/23/2014 0140   BILITOT 0.9 06/23/2014 0140         Assessment & Plan:

## 2014-07-19 ENCOUNTER — Encounter: Payer: Self-pay | Admitting: Internal Medicine

## 2014-07-19 DIAGNOSIS — F17210 Nicotine dependence, cigarettes, uncomplicated: Secondary | ICD-10-CM | POA: Insufficient documentation

## 2014-07-19 DIAGNOSIS — J9611 Chronic respiratory failure with hypoxia: Secondary | ICD-10-CM | POA: Insufficient documentation

## 2014-07-19 DIAGNOSIS — E1159 Type 2 diabetes mellitus with other circulatory complications: Secondary | ICD-10-CM | POA: Insufficient documentation

## 2014-07-19 DIAGNOSIS — I10 Essential (primary) hypertension: Secondary | ICD-10-CM | POA: Insufficient documentation

## 2014-07-19 NOTE — Assessment & Plan Note (Addendum)
Strongly prefer in this setting: Bystolic, the most beta -1  selective Beta blocker available in sample form, with bisoprolol the most selective generic choice  on the market. > try stop coreg and start bystolic 10 mg daily   ACE inhibitors are problematic in  pts with airway complaints because  even experienced pulmonologists can't always distinguish ace effects from copd/asthma.  By themselves they don't actually cause a problem, much like oxygen can't by itself start a fire, but they certainly serve as a powerful catalyst or enhancer for any "fire"  or inflammatory process in the upper airway, be it caused by an ET  tube or more commonly reflux (especially in the obese or pts with known GERD or who are on biphoshonates).    it just makes sense to avoid ACEI  entirely in the short run - it's the only way to know cause and effect here . Could add arb if bnp up next ov

## 2014-07-19 NOTE — Assessment & Plan Note (Addendum)
He clearly has at least moderate copd but Symptoms are markedly disproportionate to objective findings and not clear this is all a lung problem but pt does appear to have difficult airway management issues. DDX of  difficult airways management all start with A and  include Adherence, Ace Inhibitors, Acid Reflux, Active Sinus Disease, Alpha 1 Antitripsin deficiency, Anxiety masquerading as Airways dz,  ABPA,  allergy(esp in young), Aspiration (esp in elderly), Adverse effects of DPI,  Active smokers, plus two Bs  = Bronchiectasis and Beta blocker use..and one C= CHF  Adherence is always the initial "prime suspect" and is a multilayered concern that requires a "trust but verify" approach in every patient - starting with knowing how to use medications, especially inhalers, correctly, keeping up with refills and understanding the fundamental difference between maintenance and prns vs those medications only taken for a very short course and then stopped and not refilled.  - very poor insight into meds - The proper method of use, as well as anticipated side effects, of a metered-dose inhaler are discussed and demonstrated to the patient. Improved effectiveness after extensive coaching during this visit to a level of approximately  75% from a baseline of < 25%  ACEi at top of list of suspects, esp with cough syncope> see hbp  ? Acid (or non-acid) GERD > always difficult to exclude as up to 75% of pts in some series report no assoc GI/ Heartburn symptoms> rec max (24h)  acid suppression and diet restrictions/ reviewed and instructions given in writing.   Active smoking sep addressed   BB issues also a concern, esp in higher doses > see hbp   ? chf > cards f/u at [facility] but needs baseline bnp next ov     Each maintenance medication was reviewed in detail including most importantly the difference between maintenance and as needed and under what circumstances the prns are to be used.  Please see instructions  for details which were reviewed in writing and the patient given a copy.

## 2014-07-19 NOTE — Assessment & Plan Note (Signed)

## 2014-07-19 NOTE — Assessment & Plan Note (Signed)
Already started on 02 with adequate sats on 2lpm >rec 24/7 02 for now and now smoking

## 2014-07-31 ENCOUNTER — Encounter: Payer: Medicare Other | Admitting: Adult Health

## 2014-08-11 ENCOUNTER — Encounter: Payer: Medicare Other | Admitting: Adult Health

## 2014-08-11 ENCOUNTER — Encounter: Payer: Self-pay | Admitting: Adult Health

## 2014-08-11 ENCOUNTER — Ambulatory Visit: Payer: Medicare Other | Admitting: Adult Health

## 2014-08-11 ENCOUNTER — Other Ambulatory Visit (INDEPENDENT_AMBULATORY_CARE_PROVIDER_SITE_OTHER): Payer: Medicare Other

## 2014-08-11 VITALS — BP 104/70 | HR 84 | Temp 98.0°F | Ht 69.0 in | Wt 202.0 lb

## 2014-08-11 DIAGNOSIS — R0600 Dyspnea, unspecified: Secondary | ICD-10-CM

## 2014-08-11 DIAGNOSIS — J449 Chronic obstructive pulmonary disease, unspecified: Secondary | ICD-10-CM

## 2014-08-11 LAB — BASIC METABOLIC PANEL
BUN: 11 mg/dL (ref 6–23)
CALCIUM: 9.8 mg/dL (ref 8.4–10.5)
CO2: 39 meq/L — AB (ref 19–32)
CREATININE: 0.87 mg/dL (ref 0.40–1.50)
Chloride: 100 mEq/L (ref 96–112)
GFR: 93.17 mL/min (ref >60.00)
GLUCOSE: 151 mg/dL — AB (ref 70–99)
Potassium: 3.7 mEq/L (ref 3.5–5.1)
SODIUM: 139 meq/L (ref 135–145)

## 2014-08-11 LAB — BRAIN NATRIURETIC PEPTIDE: Pro B Natriuretic peptide (BNP): 41 pg/mL (ref 0.0–100.0)

## 2014-08-11 MED ORDER — BISOPROLOL FUMARATE 10 MG PO TABS: 10.0000 mg | ORAL_TABLET | Freq: Every day | ORAL | Status: DC

## 2014-08-11 NOTE — Patient Instructions (Signed)
Follow med calendar closely and bring to each visit.  Continue to work on not smoking  Begin Bisoprolol 10mg  daily in place of Bystolic  follow up Dr. Melvyn Novas  In 4 weeks with PFT  Labs today  Please contact office for sooner follow up if symptoms do not improve or worsen or seek emergency care

## 2014-08-16 NOTE — Progress Notes (Signed)
Quick Note:  Called spoke with patient, advised of lab results / recs as stated by TP. Pt verbalized his understanding and denied any questions. ______ 

## 2014-08-31 NOTE — Addendum Note (Signed)
Addended by: Parke Poisson E on: 08/31/2014 03:45 PM   Modules accepted: Orders, Medications

## 2014-09-04 NOTE — Addendum Note (Signed)
Addended by: Parke Poisson E on: 09/04/2014 02:32 PM   Modules accepted: Medications

## 2014-09-21 ENCOUNTER — Telehealth: Payer: Self-pay | Admitting: *Deleted

## 2014-09-21 ENCOUNTER — Ambulatory Visit: Payer: Medicare Other | Admitting: Internal Medicine

## 2014-09-21 NOTE — Telephone Encounter (Signed)
-----   Message from Laurine Blazer, LPN sent at 02/15/2425  4:49 PM EST ----- Regarding: FLP, LFT - DUE PRIOR TO NEXT OV  3 MONTH OV SCHEDULED FOR 10/02/2014.    NEED TO CONTACT PATIENT TO DO LABS.

## 2014-09-21 NOTE — Telephone Encounter (Signed)
Left message to return call 

## 2014-09-26 NOTE — Telephone Encounter (Signed)
Left message to return call 

## 2014-09-28 NOTE — Telephone Encounter (Signed)
No return call to present date.  

## 2014-10-02 ENCOUNTER — Ambulatory Visit: Payer: Medicare Other | Admitting: Cardiovascular Disease

## 2014-10-04 ENCOUNTER — Ambulatory Visit: Payer: Medicare Other | Admitting: Cardiovascular Disease

## 2014-10-05 ENCOUNTER — Encounter: Payer: Self-pay | Admitting: Cardiovascular Disease

## 2014-10-11 ENCOUNTER — Other Ambulatory Visit: Payer: Self-pay | Admitting: Adult Health

## 2014-10-19 ENCOUNTER — Other Ambulatory Visit: Payer: Self-pay | Admitting: Internal Medicine

## 2014-10-19 DIAGNOSIS — R0600 Dyspnea, unspecified: Secondary | ICD-10-CM

## 2014-10-20 ENCOUNTER — Ambulatory Visit: Payer: Medicare Other | Admitting: Internal Medicine

## 2014-10-26 ENCOUNTER — Ambulatory Visit: Payer: Medicare Other | Admitting: Cardiovascular Disease

## 2014-11-15 ENCOUNTER — Telehealth: Payer: Self-pay | Admitting: *Deleted

## 2014-11-15 ENCOUNTER — Encounter: Payer: Self-pay | Admitting: *Deleted

## 2014-11-15 ENCOUNTER — Encounter: Payer: Self-pay | Admitting: Cardiovascular Disease

## 2014-11-15 ENCOUNTER — Ambulatory Visit (INDEPENDENT_AMBULATORY_CARE_PROVIDER_SITE_OTHER): Payer: Medicare Other | Admitting: Cardiovascular Disease

## 2014-11-15 VITALS — BP 125/68 | HR 82 | Ht 69.0 in | Wt 204.0 lb

## 2014-11-15 DIAGNOSIS — I2510 Atherosclerotic heart disease of native coronary artery without angina pectoris: Secondary | ICD-10-CM | POA: Diagnosis not present

## 2014-11-15 DIAGNOSIS — I252 Old myocardial infarction: Secondary | ICD-10-CM

## 2014-11-15 DIAGNOSIS — E785 Hyperlipidemia, unspecified: Secondary | ICD-10-CM

## 2014-11-15 DIAGNOSIS — Z716 Tobacco abuse counseling: Secondary | ICD-10-CM | POA: Diagnosis not present

## 2014-11-15 DIAGNOSIS — Z955 Presence of coronary angioplasty implant and graft: Secondary | ICD-10-CM

## 2014-11-15 DIAGNOSIS — I10 Essential (primary) hypertension: Secondary | ICD-10-CM | POA: Diagnosis not present

## 2014-11-15 NOTE — Progress Notes (Signed)
Patient ID: Ronald [name], male   DOB: 1945-10-13, 69 y.o.   MRN: [id number]      SUBJECTIVE: The patient presents for follow-up of coronary artery disease. He underwent drug-eluting stent placement x 2 to the LAD for a non-STEMI in December 2015. He also has diabetes mellitus, hypertension, hyperlipidemia, ulcerative colitis (sees Dr. Laural Golden, GI), and a history of tobacco abuse.  Echocardiogram showed normal LV function and mild tricuspid regurgitation. He has chronic dyspnea on exertion from COPD and denies orthopnea and PND. He has had occasional chest tightness for years not related to activities. This did not change following stent placement.  He has had to use sublingual nitroglycerin 3 times in the past 6 months. He said he does not have much energy but that has not changed since stent placement. He continues to smoke a pack and a half of cigarettes daily. He tried Chantix before for 2 years but felt he smoked more with Chantix. Nicotine patches gave him a rash. He has tried chewing nicotine gum.   Review of Systems: As per "subjective", otherwise negative.  No Known Allergies  Current Outpatient Prescriptions  Medication Sig Dispense Refill  . albuterol (PROVENTIL HFA;VENTOLIN HFA) 108 (90 BASE) MCG/ACT inhaler Inhale 2 puffs into the lungs every 4 (four) hours as needed for wheezing or shortness of breath (((PLAN B))).     Marland Kitchen ALPRAZolam (XANAX) 1 MG tablet Take 1 mg by mouth every 8 (eight) hours as needed for anxiety.   5  . amLODipine (NORVASC) 10 MG tablet Take 10 mg by mouth daily.  11  . aspirin 81 MG chewable tablet Chew 1 tablet (81 mg total) by mouth daily.    Marland Kitchen atorvastatin (LIPITOR) 40 MG tablet Take 1 tablet (40 mg total) by mouth daily. 90 tablet 1  . bisoprolol (ZEBETA) 10 MG tablet TAKE ONE TABLET BY MOUTH DAILY 30 tablet 2  . clopidogrel (PLAVIX) 75 MG tablet Take 75 mg by mouth daily.    Marland Kitchen dextromethorphan-guaiFENesin (MUCINEX DM) 30-600 MG per 12 hr tablet Take 1  tablet by mouth 2 (two) times daily as needed for cough.    Marland Kitchen glipiZIDE (GLUCOTROL XL) 2.5 MG 24 hr tablet Take 2.5 mg by mouth daily with breakfast.    . hydrochlorothiazide (HYDRODIURIL) 25 MG tablet Take 25 mg by mouth daily.    Marland Kitchen HYDROcodone-acetaminophen (NORCO) 10-325 MG per tablet Take 1 tablet by mouth 4 (four) times daily.   0  . ipratropium-albuterol (DUONEB) 0.5-2.5 (3) MG/3ML SOLN Take 3 mLs by nebulization 4 (four) times daily.    . isosorbide mononitrate (IMDUR) 30 MG 24 hr tablet Take 30 mg by mouth daily.    . metFORMIN (GLUCOPHAGE-XR) 500 MG 24 hr tablet Take 1,000 mg by mouth daily with breakfast.    . nitroGLYCERIN (NITROSTAT) 0.4 MG SL tablet Place 0.4 mg under the tongue every 5 (five) minutes as needed for chest pain.    Marland Kitchen oxybutynin (DITROPAN) 5 MG tablet Take 1 tablet by mouth daily.  1  . OXYGEN Inhale 2 L into the lungs as needed.    . pantoprazole (PROTONIX) 40 MG tablet Take 40 mg by mouth daily.    . potassium chloride (K-DUR) 10 MEQ tablet Take 10 mEq by mouth daily.    . pregabalin (LYRICA) 225 MG capsule Take 225 mg by mouth 2 (two) times daily.    Marland Kitchen sulfaSALAzine (AZULFIDINE) 500 MG tablet Take 500 mg by mouth 3 (three) times daily.  No current facility-administered medications for this visit.    Past Medical History  Diagnosis Date  . Ulcerative colitis   . Hypertension   . Asthma   . COPD (chronic obstructive pulmonary disease)   . GERD (gastroesophageal reflux disease)   . Type II diabetes mellitus   . Coronary artery disease   . TIA (transient ischemic attack)     "they say I've had some mini strokes; don't know when"; denies residual on 06/22/2014)  . High cholesterol   . NSTEMI (non-ST elevated myocardial infarction) 05/2014    with stent placement  . Chronic lower back pain   . Anxiety   . Depression     Past Surgical History  Procedure Laterality Date  . Appendectomy    . Cholecystectomy    . Tumor excision Right ~ 1999    "side of  my upper head"  . Coronary angioplasty with stent placement  05/2014    "2"  . Cardiac catheterization  1990's X 3    History   Social History  . Marital Status: Divorced    Spouse Name: N/A  . Number of Children: 2  . Years of Education: N/A   Occupational History  . Retired    Social History Main Topics  . Smoking status: Current Every Day Smoker -- 1.50 packs/day for 48 years    Types: Cigarettes    Start date: 02/12/1966  . Smokeless tobacco: Never Used  . Alcohol Use: 0.0 oz/week    0 Standard drinks or equivalent per week     Comment: "drank some when I was 18"  . Drug Use: No  . Sexual Activity: No   Other Topics Concern  . Not on file   Social History Narrative     Filed Vitals:   11/15/14 1120  BP: 125/68  Pulse: 82  Height: 5\' 9"  (1.753 m)  Weight: 204 lb (92.534 kg)    PHYSICAL EXAM General: NAD HEENT: Normal. Neck: No JVD, no thyromegaly. Lungs: Coarse sounds b/l with insp/exp wheezes. No rales. CV: Nondisplaced PMI. Regular rate and rhythm, normal S1/S2, no S3/S4, no murmur. No pretibial or periankle edema. No carotid bruit.  Abdomen: Soft, nontender, no distention.  Neurologic: Alert and oriented x 3.  Psych: Normal affect. Skin: Normal. Musculoskeletal: Normal range of motion, no gross deformities. Extremities: Clubbing of fingers noted.  ECG: Most recent ECG reviewed.    ASSESSMENT AND PLAN: 1. CAD s/p PCI to LAD for NSTEMI on 05/10/14: Symptomatically stable. Continue ASA, bisoprolol, Plavix, Imdur, and statin.  2. Essential HTN: Well controlled on current therapy which includes HCTZ, bisoprolol, and amlodipine. No changes. No longer on ACEI for unclear reasons.  3. Hyperlipidemia: Continue atorvastatin 40 mg. Obtain copy of lipids performed 11/14/14.  4. Tobacco abuse: Extensive cessation counseling again provided.  Dispo: f/u January 2017.  Kate Sable, M.D., F.A.C.C.

## 2014-11-15 NOTE — Patient Instructions (Signed)
Continue all current medications. Your physician wants you to follow up in: 7 months.  You will receive a reminder letter in the mail one-two months in advance.  If you don't receive a letter, please call our office to schedule the follow up appointment

## 2014-11-15 NOTE — Telephone Encounter (Signed)
Notes Recorded by Laurine Blazer, LPN on 1/0/6269 at 4:85 PM Patient notified of results by letter.

## 2014-11-15 NOTE — Telephone Encounter (Signed)
-----   Message from Merlene Laughter, LPN sent at 0/08/1592 12:58 PM EDT -----   ----- Message -----    From: Herminio Commons, MD    Sent: 11/15/2014  11:55 AM      To: Massie Maroon, CMA  Normal LDL.

## 2014-12-18 ENCOUNTER — Ambulatory Visit: Payer: Medicare Other | Admitting: Internal Medicine

## 2014-12-27 ENCOUNTER — Other Ambulatory Visit: Payer: Self-pay | Admitting: Cardiovascular Disease

## 2015-01-11 ENCOUNTER — Other Ambulatory Visit: Payer: Self-pay | Admitting: Adult Health

## 2015-03-12 ENCOUNTER — Other Ambulatory Visit: Payer: Self-pay | Admitting: Internal Medicine

## 2015-04-12 ENCOUNTER — Other Ambulatory Visit: Payer: Self-pay | Admitting: Internal Medicine

## 2015-05-11 ENCOUNTER — Other Ambulatory Visit: Payer: Self-pay | Admitting: Internal Medicine

## 2015-06-10 DIAGNOSIS — I639 Cerebral infarction, unspecified: Secondary | ICD-10-CM

## 2015-06-10 HISTORY — DX: Cerebral infarction, unspecified: I63.9

## 2015-06-26 ENCOUNTER — Encounter: Payer: Self-pay | Admitting: Cardiovascular Disease

## 2015-06-26 ENCOUNTER — Ambulatory Visit (INDEPENDENT_AMBULATORY_CARE_PROVIDER_SITE_OTHER): Payer: Medicare Other | Admitting: Cardiovascular Disease

## 2015-06-26 VITALS — BP 110/68 | HR 63 | Ht 69.0 in | Wt 199.0 lb

## 2015-06-26 DIAGNOSIS — E785 Hyperlipidemia, unspecified: Secondary | ICD-10-CM

## 2015-06-26 DIAGNOSIS — I10 Essential (primary) hypertension: Secondary | ICD-10-CM | POA: Diagnosis not present

## 2015-06-26 DIAGNOSIS — Z720 Tobacco use: Secondary | ICD-10-CM

## 2015-06-26 DIAGNOSIS — I2510 Atherosclerotic heart disease of native coronary artery without angina pectoris: Secondary | ICD-10-CM

## 2015-06-26 DIAGNOSIS — I252 Old myocardial infarction: Secondary | ICD-10-CM

## 2015-06-26 DIAGNOSIS — R5383 Other fatigue: Secondary | ICD-10-CM

## 2015-06-26 MED ORDER — BISOPROLOL FUMARATE 5 MG PO TABS: 5.0000 mg | ORAL_TABLET | Freq: Every day | ORAL | Status: DC

## 2015-06-26 NOTE — Addendum Note (Signed)
Addended by: Laurine Blazer on: 06/26/2015 01:55 PM   Modules accepted: Orders

## 2015-06-26 NOTE — Patient Instructions (Addendum)
   Stop Plavix  Decrease Bisoprolol to 5mg  daily - may take 1/2 tab of your 10mg  tablet till finish current supply.  New 5mg  sent to [facility] today. Continue all other medications.   Your physician wants you to follow up in:  1 year.  You will receive a reminder letter in the mail one-two months in advance.  If you don't receive a letter, please call our office to schedule the follow up appointment

## 2015-06-26 NOTE — Progress Notes (Signed)
Patient ID: Ronald [name], male   DOB: 01-25-1946, 70 y.o.   MRN: [id number]      SUBJECTIVE: The patient presents for follow-up of coronary artery disease. He underwent drug-eluting stent placement x 2 to the LAD for a non-STEMI in December 2015 at [facility]. He also has diabetes mellitus, hypertension, hyperlipidemia, ulcerative colitis (sees Dr. Laural Golden, GI), and a history of tobacco abuse.  Echocardiogram showed normal LV function and mild tricuspid regurgitation. He has chronic dyspnea on exertion from COPD and denies orthopnea and PND. He has had occasional chest tightness for years not related to activities. This did not change following stent placement.  He denies chest pain and worsening of chronic exertional dyspnea. He complains of fatigue which preceded his myocardial infarction.  ECG performed in the office today demonstrates normal sinus rhythm, heart rate 63 bpm, with LVH and repolarization abnormalities.    Review of Systems: As per "subjective", otherwise negative.  No Known Allergies  Current Outpatient Prescriptions  Medication Sig Dispense Refill  . albuterol (PROVENTIL HFA;VENTOLIN HFA) 108 (90 BASE) MCG/ACT inhaler Inhale 2 puffs into the lungs every 4 (four) hours as needed for wheezing or shortness of breath (((PLAN B))).     Marland Kitchen amLODipine (NORVASC) 10 MG tablet Take 10 mg by mouth daily.  11  . aspirin 81 MG chewable tablet Chew 1 tablet (81 mg total) by mouth daily.    Marland Kitchen atorvastatin (LIPITOR) 40 MG tablet TAKE ONE TABLET BY MOUTH DAILY 90 tablet 3  . bisoprolol (ZEBETA) 10 MG tablet TAKE ONE TABLET BY MOUTH DAILY 30 tablet 0  . clopidogrel (PLAVIX) 75 MG tablet Take 75 mg by mouth daily.    Marland Kitchen FLUoxetine (PROZAC) 20 MG capsule Take 20 mg by mouth daily.    Marland Kitchen glipiZIDE (GLUCOTROL XL) 2.5 MG 24 hr tablet Take 2.5 mg by mouth daily with breakfast.    . hydrochlorothiazide (HYDRODIURIL) 25 MG tablet Take 25 mg by mouth daily.    Marland Kitchen HYDROcodone-acetaminophen  (NORCO) 10-325 MG per tablet Take 1 tablet by mouth 4 (four) times daily.   0  . ipratropium-albuterol (DUONEB) 0.5-2.5 (3) MG/3ML SOLN Take 3 mLs by nebulization 4 (four) times daily.    . isosorbide mononitrate (IMDUR) 30 MG 24 hr tablet Take 30 mg by mouth daily.    . metFORMIN (GLUCOPHAGE-XR) 500 MG 24 hr tablet Take 1,000 mg by mouth daily with breakfast.    . nitroGLYCERIN (NITROSTAT) 0.4 MG SL tablet Place 0.4 mg under the tongue every 5 (five) minutes as needed for chest pain.    Marland Kitchen omeprazole (PRILOSEC) 20 MG capsule Take 20 mg by mouth daily.    Marland Kitchen oxybutynin (DITROPAN) 5 MG tablet Take 1 tablet by mouth daily.  1  . pantoprazole (PROTONIX) 40 MG tablet Take 40 mg by mouth daily.    . potassium chloride (K-DUR) 10 MEQ tablet Take 10 mEq by mouth daily.    . pregabalin (LYRICA) 225 MG capsule Take 225 mg by mouth 2 (two) times daily.    Marland Kitchen sulfaSALAzine (AZULFIDINE) 500 MG tablet Take 500 mg by mouth 3 (three) times daily.     Marland Kitchen ALPRAZolam (XANAX) 1 MG tablet Take 1 mg by mouth every 8 (eight) hours as needed for anxiety.   5   No current facility-administered medications for this visit.    Past Medical History  Diagnosis Date  . Ulcerative colitis ([place])   . Hypertension   . Asthma   . COPD (chronic obstructive pulmonary  disease) ([place])   . GERD (gastroesophageal reflux disease)   . Type II diabetes mellitus ([place])   . Coronary artery disease   . TIA (transient ischemic attack)     "they say I've had some mini strokes; don't know when"; denies residual on 06/22/2014)  . High cholesterol   . NSTEMI (non-ST elevated myocardial infarction) ([place]) 05/2014    with stent placement  . Chronic lower back pain   . Anxiety   . Depression     Past Surgical History  Procedure Laterality Date  . Appendectomy    . Cholecystectomy    . Tumor excision Right ~ 1999    "side of my upper head"  . Coronary angioplasty with stent placement  05/2014    "2"  . Cardiac catheterization  1990's X  3    Social History   Social History  . Marital Status: Divorced    Spouse Name: N/A  . Number of Children: 2  . Years of Education: N/A   Occupational History  . Retired    Social History Main Topics  . Smoking status: Current Every Day Smoker -- 1.50 packs/day for 48 years    Types: Cigarettes    Start date: 02/12/1966  . Smokeless tobacco: Never Used  . Alcohol Use: 0.0 oz/week    0 Standard drinks or equivalent per week     Comment: "drank some when I was 18"  . Drug Use: No  . Sexual Activity: No   Other Topics Concern  . Not on file   Social History Narrative     Filed Vitals:   06/26/15 1322  BP: 110/68  Pulse: 63  Height: 5\' 9"  (1.753 m)  Weight: 199 lb (90.266 kg)  SpO2: 94%    PHYSICAL EXAM General: NAD HEENT: Normal. Neck: No JVD, no thyromegaly. Lungs: Coarse sounds b/l with insp/exp wheezes. No rales. CV: Nondisplaced PMI. Regular rate and rhythm, normal S1/S2, no S3/S4, no murmur. No pretibial or periankle edema. No carotid bruit.  Abdomen: Soft, nontender, no distention.  Neurologic: Alert and oriented x 3.  Psych: Normal affect. Skin: Normal. Musculoskeletal: No gross deformities. Extremities: Clubbing of fingers noted.  ECG: Most recent ECG reviewed.      ASSESSMENT AND PLAN: 1. CAD s/p PCI to LAD for NSTEMI on 05/10/14: Symptomatically stable. Continue ASA, bisoprolol (reduce to 5 mg daily due to fatigue), Imdur, and statin. Will stop Plavix.  2. Essential HTN: Well controlled on current therapy which includes HCTZ, bisoprolol, and amlodipine. Will reduce bisoprolol to 5 mg due to fatigue. No longer on ACEI for unclear reasons.  3. Hyperlipidemia: Continue atorvastatin 40 mg. LDL normal on 11/14/14.  4. Tobacco abuse: Smokes 1.5 ppd. Extensive cessation counseling previously provided.  5. Fatigue: Decrease bisoprolol to 5 mg. Encouraged to speak with PCP.  Dispo: f/u 1 year.  Kate Sable, M.D., F.A.C.C.

## 2015-12-15 ENCOUNTER — Other Ambulatory Visit: Payer: Self-pay | Admitting: Cardiovascular Disease

## 2016-02-04 ENCOUNTER — Other Ambulatory Visit: Payer: Self-pay | Admitting: Cardiovascular Disease

## 2016-02-14 ENCOUNTER — Encounter: Payer: Self-pay | Admitting: Family Medicine

## 2016-02-14 ENCOUNTER — Ambulatory Visit (INDEPENDENT_AMBULATORY_CARE_PROVIDER_SITE_OTHER): Payer: Medicare Other | Admitting: Family Medicine

## 2016-02-14 VITALS — BP 170/80 | HR 66 | Temp 97.3°F | Ht 69.0 in | Wt 193.2 lb

## 2016-02-14 DIAGNOSIS — I10 Essential (primary) hypertension: Secondary | ICD-10-CM | POA: Diagnosis not present

## 2016-02-14 DIAGNOSIS — J439 Emphysema, unspecified: Secondary | ICD-10-CM

## 2016-02-14 DIAGNOSIS — M5136 Other intervertebral disc degeneration, lumbar region: Secondary | ICD-10-CM

## 2016-02-14 DIAGNOSIS — Z1159 Encounter for screening for other viral diseases: Secondary | ICD-10-CM | POA: Diagnosis not present

## 2016-02-14 DIAGNOSIS — F418 Other specified anxiety disorders: Secondary | ICD-10-CM

## 2016-02-14 DIAGNOSIS — E1142 Type 2 diabetes mellitus with diabetic polyneuropathy: Secondary | ICD-10-CM

## 2016-02-14 DIAGNOSIS — F329 Major depressive disorder, single episode, unspecified: Secondary | ICD-10-CM

## 2016-02-14 DIAGNOSIS — F32A Depression, unspecified: Secondary | ICD-10-CM

## 2016-02-14 DIAGNOSIS — F419 Anxiety disorder, unspecified: Secondary | ICD-10-CM | POA: Insufficient documentation

## 2016-02-14 LAB — BAYER DCA HB A1C WAIVED: HB A1C: 5.8 % (ref <7.0)

## 2016-02-14 MED ORDER — FLUTICASONE FUROATE-VILANTEROL 100-25 MCG/INH IN AEPB: 1.0000 | INHALATION_SPRAY | Freq: Every day | RESPIRATORY_TRACT | 2 refills | Status: DC

## 2016-02-14 NOTE — Progress Notes (Signed)
BP (!) 170/80   Pulse 66   Temp 97.3 F (36.3 C) (Oral)   Ht 5' 9"  (1.753 m)   Wt 193 lb 3.2 oz (87.6 kg)   BMI 28.53 kg/m    Subjective:    Patient ID: Ronald [name], male    DOB: 26-Sep-1945, 70 y.o.   MRN: [id number]  HPI: Ronald [name] is a 70 y.o. male presenting on 02/14/2016 for Establish Care (patient is fasting; states he is up to date on vaccines will get records from previous doctor; patient was on Hydrocodne and Xanax with previous doctor, would like to restart medications)   HPI Hypertension Patient is coming in to establish care with our office as a new patient to [place]. He has known hypertension. His blood pressure is 170/80. He says he did not take his medication today because is been out for the past 2 days. Patient denies headaches, blurred vision, shortness of breath, or weakness. Denies any side effects from medication and is content with current medication. He did have a myocardial infarction with 2 stents placed last year and still gets intermittent anginal symptoms does not today. He does have a cardiologist that he sees. He also has nitroglycerin that he uses and has had to use about every other month since his myocardial infarction. Recommended close follow-up with his cardiologist  COPD breathing Patient is currently on an albuterol inhaler and nebulizer but does not have any other maintenance inhaler. He says he used to be on a maintenance inhaler quite a few years ago but is not willing currently. He does admit that he is still smoking and is not ready to quit just yet this time. He says that he has to use his albuterol 1-2 times daily to help with coughing spells and wheezing. He denies any shortness of breath or coughing or wheezing right now here in office.  Type 2 diabetes Patient is coming in for take 2 diabetes checkup. He is currently on glipizide and metformin. He denies any issues with either of the medications and says he has been on them for quite some time.  He does know that he had labwork done at his previous 64 office but it's been at least more than 3 months and he does not recall what his last hemoglobin A1c was. Patient is not currently on an Ace or an arb, likely because he says that he has previous renal disease. He is on a statin. He is due for foot exam per our records and he has not yet seen an ophthalmologist this year. Patient does have some intermittent known neuropathy and is on Lyrica to control this pain.  Anxiety and depression Patient has had anxiety and depression for quite a few years through his life. He is currently on Prozac and has been on it for quite a few years. He is also been on Xanax 1 mg 3 times daily and has been taking it consistently 3 times daily. He has been out of it for the past month. He denies any withdrawal symptoms from the medication. He denies any suicidal ideations or thoughts of hurting himself. He says he sleeps well at night. He denies any feelings of hopelessness or helplessness. He says is mostly anxiety which is driven by his health issues.  Degenerative disc disease and chronic back pain Patient says that he has known chronic degenerative disc changes in his lower back and has been on Norco for quite some time for this. He says  that usually works pretty well for him. He denies any weakness or numbness going down into either leg and he does have some tingling and burning neuropathy that he thinks may be due to his spinal problems or his diabetes. He has had injections many years ago in the past but has not had any recently or has not had any imaging recently. We will request the records from his previous 57 office  Relevant past medical, surgical, family and social history reviewed and updated as indicated. Interim medical history since our last visit reviewed. Allergies and medications reviewed and updated.  Review of Systems  Constitutional: Negative for chills and fever.  HENT:  Negative for congestion, postnasal drip, rhinorrhea and sinus pressure.   Eyes: Negative for discharge.  Respiratory: Negative for cough, chest tightness, shortness of breath and wheezing.   Cardiovascular: Negative for chest pain, palpitations and leg swelling.  Gastrointestinal: Negative for abdominal distention, abdominal pain, anal bleeding and blood in stool.  Musculoskeletal: Positive for back pain and myalgias. Negative for gait problem.  Skin: Negative for color change and rash.  Neurological: Negative for dizziness, light-headedness and headaches.  Psychiatric/Behavioral: Positive for decreased concentration, dysphoric mood and sleep disturbance. Negative for self-injury and suicidal ideas. The patient is nervous/anxious.   All other systems reviewed and are negative.   Per HPI unless specifically indicated above  Social History   Social History  . Marital status: Divorced    Spouse name: N/A  . Number of children: 2  . Years of education: N/A   Occupational History  . Retired    Social History Main Topics  . Smoking status: Current Every Day Smoker    Packs/day: 1.50    Years: 48.00    Types: Cigarettes    Start date: 02/12/1966  . Smokeless tobacco: Never Used  . Alcohol use 0.0 oz/week     Comment: "drank some when I was 18"  . Drug use: No  . Sexual activity: No   Other Topics Concern  . Not on file   Social History Narrative  . No narrative on file    Past Surgical History:  Procedure Laterality Date  . APPENDECTOMY    . CARDIAC CATHETERIZATION  1990's X 3  . CHOLECYSTECTOMY    . CORONARY ANGIOPLASTY WITH STENT PLACEMENT  05/2014   "2"  . TUMOR EXCISION Right ~ 1999   "side of my upper head"    Family History  Problem Relation Age of Onset  . CAD Father   . Lung cancer Brother     smoked  . Leukemia Sister       Medication List       Accurate as of 02/14/16  9:53 AM. Always use your most recent med list.          albuterol 108 (90  Base) MCG/ACT inhaler Commonly known as:  PROVENTIL HFA;VENTOLIN HFA Inhale 2 puffs into the lungs every 4 (four) hours as needed for wheezing or shortness of breath (((PLAN B))).   amLODipine 10 MG tablet Commonly known as:  NORVASC Take 10 mg by mouth daily.   aspirin 81 MG chewable tablet Chew 1 tablet (81 mg total) by mouth daily.   atorvastatin 40 MG tablet Commonly known as:  LIPITOR TAKE ONE TABLET BY MOUTH DAILY   bisoprolol 5 MG tablet Commonly known as:  ZEBETA Take 1 tablet (5 mg total) by mouth daily.   clopidogrel 75 MG tablet Commonly known as:  PLAVIX Take 75 mg by  mouth daily.   FLUoxetine 20 MG capsule Commonly known as:  PROZAC Take 20 mg by mouth daily.   fluticasone furoate-vilanterol 100-25 MCG/INH Aepb Commonly known as:  BREO ELLIPTA Inhale 1 puff into the lungs daily.   glipiZIDE 2.5 MG 24 hr tablet Commonly known as:  GLUCOTROL XL Take 2.5 mg by mouth daily with breakfast.   hydrochlorothiazide 25 MG tablet Commonly known as:  HYDRODIURIL Take 25 mg by mouth daily.   HYDROcodone-acetaminophen 10-325 MG tablet Commonly known as:  NORCO Take 1 tablet by mouth 4 (four) times daily.   ipratropium-albuterol 0.5-2.5 (3) MG/3ML Soln Commonly known as:  DUONEB Take 3 mLs by nebulization 4 (four) times daily.   isosorbide mononitrate 30 MG 24 hr tablet Commonly known as:  IMDUR Take 30 mg by mouth daily.   metFORMIN 500 MG 24 hr tablet Commonly known as:  GLUCOPHAGE-XR Take 1,000 mg by mouth daily with breakfast.   nitroGLYCERIN 0.4 MG SL tablet Commonly known as:  NITROSTAT Place 0.4 mg under the tongue every 5 (five) minutes as needed for chest pain.   omeprazole 20 MG capsule Commonly known as:  PRILOSEC Take 20 mg by mouth daily.   oxybutynin 5 MG tablet Commonly known as:  DITROPAN Take 1 tablet by mouth daily.   potassium chloride 10 MEQ tablet Commonly known as:  K-DUR Take 10 mEq by mouth daily.   pregabalin 225 MG  capsule Commonly known as:  LYRICA Take 225 mg by mouth 2 (two) times daily.   sulfaSALAzine 500 MG tablet Commonly known as:  AZULFIDINE Take 500 mg by mouth 3 (three) times daily.   triamcinolone ointment 0.1 % Commonly known as:  KENALOG Apply 1 application topically 2 (two) times daily.          Objective:    BP (!) 170/80   Pulse 66   Temp 97.3 F (36.3 C) (Oral)   Ht 5' 9"  (1.753 m)   Wt 193 lb 3.2 oz (87.6 kg)   BMI 28.53 kg/m   Wt Readings from Last 3 Encounters:  02/14/16 193 lb 3.2 oz (87.6 kg)  06/26/15 199 lb (90.3 kg)  11/15/14 204 lb (92.5 kg)    Physical Exam  Constitutional: He is oriented to person, place, and time. He appears well-developed and well-nourished. No distress.  Eyes: Conjunctivae are normal. Right eye exhibits no discharge. No scleral icterus.  Neck: Neck supple. No thyromegaly present.  Cardiovascular: Normal rate, regular rhythm, normal heart sounds and intact distal pulses.   No murmur heard. Pulmonary/Chest: Effort normal. No respiratory distress. He has wheezes. He has no rales. He exhibits no tenderness.  Musculoskeletal: Normal range of motion. He exhibits no edema.       Lumbar back: He exhibits tenderness (Bilateral paraspinal and midline tenderness, negative straight leg raise bilaterally). He exhibits normal range of motion, no swelling, no edema, no deformity and normal pulse.  Lymphadenopathy:    He has no cervical adenopathy.  Neurological: He is alert and oriented to person, place, and time. Coordination normal.  Skin: Skin is warm and dry. No rash noted. He is not diaphoretic.  Psychiatric: His behavior is normal. Judgment normal. His mood appears anxious. He exhibits a depressed mood. He expresses no suicidal ideation. He expresses no suicidal plans.  Nursing note and vitals reviewed.     Assessment & Plan:   Problem List Items Addressed This Visit      Cardiovascular and Mediastinum   Essential hypertension -  Primary  Relevant Orders   CMP14+EGFR (Completed)   Lipid panel (Completed)     Respiratory   COPD (chronic obstructive pulmonary disease) with emphysema (HCC)   Relevant Medications   fluticasone furoate-vilanterol (BREO ELLIPTA) 100-25 MCG/INH AEPB     Endocrine   Diabetes mellitus ([place])   Relevant Orders   Bayer DCA Hb A1c Waived (Completed)   CMP14+EGFR (Completed)   Lipid panel (Completed)   Microalbumin / creatinine urine ratio     Other   Anxiety and depression   Relevant Orders   CBC with Differential/Platelet (Completed)    Other Visit Diagnoses    Degenerative disc disease, lumbar       Relevant Orders   ToxASSURE Select 13 (MW), Urine   Need for hepatitis C screening test       Relevant Orders   Hepatitis C antibody (Completed)       Follow up plan: Return in about 2 weeks (around 02/28/2016), or if symptoms worsen or fail to improve, for degenerative disc, anxiety, initial pain.  Caryl Pina, MD [facility] 02/14/2016, 9:53 AM

## 2016-02-15 LAB — CMP14+EGFR
A/G RATIO: 1.4 (ref 1.2–2.2)
ALBUMIN: 4.1 g/dL (ref 3.6–4.8)
ALT: 9 IU/L (ref 0–44)
AST: 11 IU/L (ref 0–40)
Alkaline Phosphatase: 109 IU/L (ref 39–117)
BILIRUBIN TOTAL: 0.2 mg/dL (ref 0.0–1.2)
BUN/Creatinine Ratio: 16 (ref 10–24)
BUN: 13 mg/dL (ref 8–27)
CALCIUM: 9.4 mg/dL (ref 8.6–10.2)
CHLORIDE: 93 mmol/L — AB (ref 96–106)
CO2: 31 mmol/L — ABNORMAL HIGH (ref 18–29)
Creatinine, Ser: 0.8 mg/dL (ref 0.76–1.27)
GFR, EST AFRICAN AMERICAN: 106 mL/min/{1.73_m2} (ref >59)
GFR, EST NON AFRICAN AMERICAN: 92 mL/min/{1.73_m2} (ref >59)
GLOBULIN, TOTAL: 3 g/dL (ref 1.5–4.5)
Glucose: 89 mg/dL (ref 65–99)
Potassium: 3.9 mmol/L (ref 3.5–5.2)
SODIUM: 139 mmol/L (ref 134–144)
TOTAL PROTEIN: 7.1 g/dL (ref 6.0–8.5)

## 2016-02-15 LAB — LIPID PANEL
CHOL/HDL RATIO: 4.6 ratio (ref 0.0–5.0)
Cholesterol, Total: 115 mg/dL (ref 100–199)
HDL: 25 mg/dL — AB (ref >39)
LDL Calculated: 43 mg/dL (ref 0–99)
Triglycerides: 236 mg/dL — ABNORMAL HIGH (ref 0–149)
VLDL Cholesterol Cal: 47 mg/dL — ABNORMAL HIGH (ref 5–40)

## 2016-02-15 LAB — CBC WITH DIFFERENTIAL/PLATELET
Basophils Absolute: 0.1 10*3/uL (ref 0.0–0.2)
Basos: 1 %
EOS (ABSOLUTE): 0.2 10*3/uL (ref 0.0–0.4)
Eos: 2 %
HEMATOCRIT: 47 % (ref 37.5–51.0)
HEMOGLOBIN: 16.2 g/dL (ref 12.6–17.7)
IMMATURE GRANULOCYTES: 0 %
Immature Grans (Abs): 0 10*3/uL (ref 0.0–0.1)
Lymphocytes Absolute: 2.1 10*3/uL (ref 0.7–3.1)
Lymphs: 20 %
MCH: 29.3 pg (ref 26.6–33.0)
MCHC: 34.5 g/dL (ref 31.5–35.7)
MCV: 85 fL (ref 79–97)
Monocytes Absolute: 1.3 10*3/uL — ABNORMAL HIGH (ref 0.1–0.9)
Monocytes: 12 %
Neutrophils Absolute: 6.6 10*3/uL (ref 1.4–7.0)
Neutrophils: 65 %
Platelets: 344 10*3/uL (ref 150–379)
RBC: 5.53 x10E6/uL (ref 4.14–5.80)
RDW: 14.8 % (ref 12.3–15.4)
WBC: 10.2 10*3/uL (ref 3.4–10.8)

## 2016-02-15 LAB — HEPATITIS C ANTIBODY: Hep C Virus Ab: <0.1 s/co ratio (ref 0.0–0.9)

## 2016-02-15 LAB — MICROALBUMIN / CREATININE URINE RATIO
Creatinine, Urine: 89.9 mg/dL
MICROALB/CREAT RATIO: 10.3 mg/g creat (ref 0.0–30.0)
MICROALBUM., U, RANDOM: 9.3 ug/mL

## 2016-02-21 LAB — TOXASSURE SELECT 13 (MW), URINE

## 2016-02-28 ENCOUNTER — Ambulatory Visit (INDEPENDENT_AMBULATORY_CARE_PROVIDER_SITE_OTHER): Payer: Medicare Other | Admitting: Family Medicine

## 2016-02-28 ENCOUNTER — Encounter: Payer: Self-pay | Admitting: Family Medicine

## 2016-02-28 VITALS — BP 134/75 | HR 65 | Temp 97.1°F | Ht 69.0 in | Wt 194.4 lb

## 2016-02-28 DIAGNOSIS — F418 Other specified anxiety disorders: Secondary | ICD-10-CM | POA: Diagnosis not present

## 2016-02-28 DIAGNOSIS — M545 Low back pain: Secondary | ICD-10-CM | POA: Diagnosis not present

## 2016-02-28 DIAGNOSIS — E1142 Type 2 diabetes mellitus with diabetic polyneuropathy: Secondary | ICD-10-CM

## 2016-02-28 DIAGNOSIS — F329 Major depressive disorder, single episode, unspecified: Secondary | ICD-10-CM

## 2016-02-28 DIAGNOSIS — G8929 Other chronic pain: Secondary | ICD-10-CM

## 2016-02-28 DIAGNOSIS — F32A Depression, unspecified: Secondary | ICD-10-CM

## 2016-02-28 DIAGNOSIS — F419 Anxiety disorder, unspecified: Principal | ICD-10-CM

## 2016-02-28 DIAGNOSIS — I10 Essential (primary) hypertension: Secondary | ICD-10-CM | POA: Diagnosis not present

## 2016-02-28 DIAGNOSIS — L309 Dermatitis, unspecified: Secondary | ICD-10-CM | POA: Diagnosis not present

## 2016-02-28 LAB — BAYER DCA HB A1C WAIVED: HB A1C: 5.8 % (ref <7.0)

## 2016-02-28 MED ORDER — BUDESONIDE-FORMOTEROL FUMARATE 160-4.5 MCG/ACT IN AERO: 2.0000 | INHALATION_SPRAY | Freq: Two times a day (BID) | RESPIRATORY_TRACT | 3 refills | Status: DC

## 2016-02-28 MED ORDER — HYDROXYZINE HCL 50 MG PO TABS: 50.0000 mg | ORAL_TABLET | Freq: Three times a day (TID) | ORAL | 1 refills | Status: DC | PRN

## 2016-02-28 MED ORDER — CLOBETASOL PROPIONATE 0.05 % EX OINT: 1.0000 "application " | TOPICAL_OINTMENT | Freq: Two times a day (BID) | CUTANEOUS | 0 refills | Status: DC

## 2016-02-28 MED ORDER — FLUOXETINE HCL 40 MG PO CAPS: 40.0000 mg | ORAL_CAPSULE | Freq: Every day | ORAL | 1 refills | Status: DC

## 2016-02-28 NOTE — Progress Notes (Signed)
BP 134/75   Pulse 65   Temp 97.1 F (36.2 C) (Oral)   Ht _0  (1.753 m)   Wt 194 lb 6 oz (88.2 kg)   BMI 28.70 kg/m    Subjective:    Patient ID: Ronald [name], male    DOB: 11/01/1945, 70 y.o.   MRN: [id number]  HPI: Ronald [name] is a 70 y.o. male presenting on 02/28/2016 for Initial Pain (out of Norco) and Anxiety (ouf of Xanax)   HPI Anxiety and depression Patient comes in for recheck on anxiety and depression. He is currently on Prozac but had previously been on a benzodiazepine but had run out of it about 2 months ago. He had saved an occasional one which she has been using as needed over the past 2 months. He denies any withdrawals from that medication. He is on a low dose of the Prozac has said that it has given him some benefit previously. He says he has been a lot more anxiety and panic attacks more recently. His anxiety is also associated with a lot with his health issues. Patient is also illiterate which makes it a challenge for him and her current Society.  Type 2 diabetes with neuropathy Patient does not check his blood sugars too frequently but when he has checked them it has run typically between 120 150. He has known neuropathy in both of his feet. He is currently on Lyrica for his neuropathy. He is currently on metformin and glipizide for his diabetes. He is due for hemoglobin A1c. He says his neuropathy is mostly controlled with the Lyrica.  Hypertension Patient is coming in for recheck of his blood pressure as well. His blood pressure today is 134/75. He is currently on hydrochlorothiazide and Imdur and amlodipine and bisoprolol. Patient denies headaches, blurred vision, chest pains, shortness of breath, or weakness. Denies any side effects from medication and is content with current medication.   Chronic lower back pain Patient has chronic lower back pain and has previously been on pain medication but has not over the past month. He would like a referral to pain  management. Patient understands the safety issues and difficulties with taking both benzodiazepines and narcotics together. His low back pain does not lead to any weakness or numbness in either lower extremity. The pain does not radiate down into his legs or up into his back. It is mostly lumbar and bilateral in a bandlike positioned across the lower back.  Rash Patient has a rash develop in between his fingers and in the anterior parts of his elbow. The rashes, and on throughout his life but is starting to worsen over the past month. He is tried some over-the-counter creams previously but does not have anything currently that he is using for it. He denies any erythema or drainage or warmth or redness. They're very pruritic.  Relevant past medical, surgical, family and social history reviewed and updated as indicated. Interim medical history since our last visit reviewed. Allergies and medications reviewed and updated.  Review of Systems  Constitutional: Negative for chills and fever.  HENT: Negative for congestion, postnasal drip, rhinorrhea and sinus pressure.   Eyes: Negative for discharge.  Respiratory: Negative for cough, chest tightness, shortness of breath and wheezing.   Cardiovascular: Negative for chest pain, palpitations and leg swelling.  Gastrointestinal: Negative for abdominal distention, abdominal pain, anal bleeding and blood in stool.  Musculoskeletal: Positive for back pain and myalgias. Negative for gait problem.  Skin: Positive  for rash. Negative for color change.  Neurological: Positive for numbness. Negative for dizziness, light-headedness and headaches.  Psychiatric/Behavioral: Positive for decreased concentration, dysphoric mood and sleep disturbance. Negative for self-injury and suicidal ideas. The patient is nervous/anxious.   All other systems reviewed and are negative.   Per HPI unless specifically indicated above     Medication List       Accurate as of  02/28/16 11:21 AM. Always use your most recent med list.          albuterol 108 (90 Base) MCG/ACT inhaler Commonly known as:  PROVENTIL HFA;VENTOLIN HFA Inhale 2 puffs into the lungs every 4 (four) hours as needed for wheezing or shortness of breath (((PLAN B))).   amLODipine 10 MG tablet Commonly known as:  NORVASC Take 10 mg by mouth daily.   aspirin 81 MG chewable tablet Chew 1 tablet (81 mg total) by mouth daily.   atorvastatin 40 MG tablet Commonly known as:  LIPITOR TAKE ONE TABLET BY MOUTH DAILY   bisoprolol 5 MG tablet Commonly known as:  ZEBETA Take 1 tablet (5 mg total) by mouth daily.   budesonide-formoterol 160-4.5 MCG/ACT inhaler Commonly known as:  SYMBICORT Inhale 2 puffs into the lungs 2 (two) times daily.   clobetasol ointment 0.05 % Commonly known as:  TEMOVATE Apply 1 application topically 2 (two) times daily.   clopidogrel 75 MG tablet Commonly known as:  PLAVIX Take 75 mg by mouth daily.   FLUoxetine 40 MG capsule Commonly known as:  PROZAC Take 1 capsule (40 mg total) by mouth daily.   glipiZIDE 2.5 MG 24 hr tablet Commonly known as:  GLUCOTROL XL Take 2.5 mg by mouth daily with breakfast.   hydrochlorothiazide 25 MG tablet Commonly known as:  HYDRODIURIL Take 25 mg by mouth daily.   HYDROcodone-acetaminophen 10-325 MG tablet Commonly known as:  NORCO Take 1 tablet by mouth 4 (four) times daily.   hydrOXYzine 50 MG tablet Commonly known as:  ATARAX/VISTARIL Take 1 tablet (50 mg total) by mouth 3 (three) times daily as needed.   ipratropium-albuterol 0.5-2.5 (3) MG/3ML Soln Commonly known as:  DUONEB Take 3 mLs by nebulization 4 (four) times daily.   isosorbide mononitrate 30 MG 24 hr tablet Commonly known as:  IMDUR Take 30 mg by mouth daily.   metFORMIN 500 MG 24 hr tablet Commonly known as:  GLUCOPHAGE-XR Take 1,000 mg by mouth daily with breakfast.   nitroGLYCERIN 0.4 MG SL tablet Commonly known as:  NITROSTAT Place 0.4 mg  under the tongue every 5 (five) minutes as needed for chest pain.   omeprazole 20 MG capsule Commonly known as:  PRILOSEC Take 20 mg by mouth daily.   oxybutynin 5 MG tablet Commonly known as:  DITROPAN Take 1 tablet by mouth daily.   potassium chloride 10 MEQ tablet Commonly known as:  K-DUR Take 10 mEq by mouth daily.   pregabalin 225 MG capsule Commonly known as:  LYRICA Take 225 mg by mouth 2 (two) times daily.   sulfaSALAzine 500 MG tablet Commonly known as:  AZULFIDINE Take 500 mg by mouth 3 (three) times daily.   triamcinolone ointment 0.1 % Commonly known as:  KENALOG Apply 1 application topically 2 (two) times daily.          Objective:    BP 134/75   Pulse 65   Temp 97.1 F (36.2 C) (Oral)   Ht _0  (1.753 m)   Wt 194 lb 6 oz (88.2 kg)  BMI 28.70 kg/m   Wt Readings from Last 3 Encounters:  02/28/16 194 lb 6 oz (88.2 kg)  02/14/16 193 lb 3.2 oz (87.6 kg)  06/26/15 199 lb (90.3 kg)    Physical Exam  Constitutional: He is oriented to person, place, and time. He appears well-developed and well-nourished. No distress.  Eyes: Conjunctivae are normal. Right eye exhibits no discharge. No scleral icterus.  Neck: Neck supple. No thyromegaly present.  Cardiovascular: Normal rate, regular rhythm, normal heart sounds and intact distal pulses.   No murmur heard. Pulmonary/Chest: Effort normal. No respiratory distress. He has wheezes. He has no rales. He exhibits no tenderness.  Musculoskeletal: Normal range of motion. He exhibits tenderness (Bilateral lower lumbar tenderness, negative straight leg raise bilaterally). He exhibits no edema.       Lumbar back: He exhibits tenderness (Bilateral paraspinal and midline tenderness, negative straight leg raise bilaterally). He exhibits normal range of motion, no swelling, no edema, no deformity and normal pulse.  Lymphadenopathy:    He has no cervical adenopathy.  Neurological: He is alert and oriented to person, place,  and time. Coordination normal.  Skin: Skin is warm and dry. No rash noted. He is not diaphoretic.  Psychiatric: His behavior is normal. Judgment normal. His mood appears anxious. He exhibits a depressed mood. He expresses no suicidal ideation. He expresses no suicidal plans.  Nursing note and vitals reviewed.   Results for orders placed or performed in visit on 02/14/16  Bayer DCA Hb A1c Waived  Result Value Ref Range   Bayer DCA Hb A1c Waived 5.8 <7.0 %  CMP14+EGFR  Result Value Ref Range   Glucose 89 65 - 99 mg/dL   BUN 13 8 - 27 mg/dL   Creatinine, Ser 0.80 0.76 - 1.27 mg/dL   GFR calc non Af Amer 92 >59 mL/min/1.73   GFR calc Af Amer 106 >59 mL/min/1.73   BUN/Creatinine Ratio 16 10 - 24   Sodium 139 134 - 144 mmol/L   Potassium 3.9 3.5 - 5.2 mmol/L   Chloride 93 (L) 96 - 106 mmol/L   CO2 31 (H) 18 - 29 mmol/L   Calcium 9.4 8.6 - 10.2 mg/dL   Total Protein 7.1 6.0 - 8.5 g/dL   Albumin 4.1 3.6 - 4.8 g/dL   Globulin, Total 3.0 1.5 - 4.5 g/dL   Albumin/Globulin Ratio 1.4 1.2 - 2.2   Bilirubin Total 0.2 0.0 - 1.2 mg/dL   Alkaline Phosphatase 109 39 - 117 IU/L   AST 11 0 - 40 IU/L   ALT 9 0 - 44 IU/L  Lipid panel  Result Value Ref Range   Cholesterol, Total 115 100 - 199 mg/dL   Triglycerides 236 (H) 0 - 149 mg/dL   HDL 25 (L) >39 mg/dL   VLDL Cholesterol Cal 47 (H) 5 - 40 mg/dL   LDL Calculated 43 0 - 99 mg/dL   Chol/HDL Ratio 4.6 0.0 - 5.0 ratio units  CBC with Differential/Platelet  Result Value Ref Range   WBC 10.2 3.4 - 10.8 x10E3/uL   RBC 5.53 4.14 - 5.80 x10E6/uL   Hemoglobin 16.2 12.6 - 17.7 g/dL   Hematocrit 47.0 37.5 - 51.0 %   MCV 85 79 - 97 fL   MCH 29.3 26.6 - 33.0 pg   MCHC 34.5 31.5 - 35.7 g/dL   RDW 14.8 12.3 - 15.4 %   Platelets 344 150 - 379 x10E3/uL   Neutrophils 65 %   Lymphs 20 %   Monocytes 12 %  Eos 2 %   Basos 1 %   Neutrophils Absolute 6.6 1.4 - 7.0 x10E3/uL   Lymphocytes Absolute 2.1 0.7 - 3.1 x10E3/uL   Monocytes Absolute 1.3 (H) 0.1  - 0.9 x10E3/uL   EOS (ABSOLUTE) 0.2 0.0 - 0.4 x10E3/uL   Basophils Absolute 0.1 0.0 - 0.2 x10E3/uL   Immature Granulocytes 0 %   Immature Grans (Abs) 0.0 0.0 - 0.1 x10E3/uL  Microalbumin / creatinine urine ratio  Result Value Ref Range   Creatinine, Urine 89.9 Not Estab. mg/dL   Microalbum.,U,Random 9.3 Not Estab. ug/mL   MICROALB/CREAT RATIO 10.3 0.0 - 30.0 mg/g creat  ToxASSURE Select 13 (MW), Urine  Result Value Ref Range   ToxAssure Select 13 FINAL   Hepatitis C antibody  Result Value Ref Range   Hep C Virus Ab <0.1 0.0 - 0.9 s/co ratio      Assessment & Plan:   Problem List Items Addressed This Visit      Cardiovascular and Mediastinum   Essential hypertension   Relevant Orders   CBC with Differential/Platelet   CMP14+EGFR   Lipid panel     Endocrine   Diabetes mellitus (HCC)   Relevant Orders   Bayer DCA Hb A1c Waived   CMP14+EGFR   Lipid panel     Other   Anxiety and depression - Primary   Relevant Medications   FLUoxetine (PROZAC) 40 MG capsule   hydrOXYzine (ATARAX/VISTARIL) 50 MG tablet   Other Relevant Orders   CBC with Differential/Platelet    Other Visit Diagnoses    Severe eczema       Chronic lumbar pain       Relevant Orders   Ambulatory referral to Pain Clinic       Follow up plan: Return in about 4 weeks (around 03/27/2016), or if symptoms worsen or fail to improve, for Follow-up anxiety.  Counseling provided for all of the vaccine components Orders Placed This Encounter  Procedures  . Bayer DCA Hb A1c Waived  . CBC with Differential/Platelet  . CMP14+EGFR  . Lipid panel  . Ambulatory referral to [place], MD [facility] 02/28/2016, 11:21 AM

## 2016-02-29 LAB — CBC WITH DIFFERENTIAL/PLATELET
Basophils Absolute: 0.1 10*3/uL (ref 0.0–0.2)
Basos: 1 %
EOS (ABSOLUTE): 0.1 10*3/uL (ref 0.0–0.4)
Eos: 1 %
HEMOGLOBIN: 14.8 g/dL (ref 12.6–17.7)
Hematocrit: 42.6 % (ref 37.5–51.0)
IMMATURE GRANS (ABS): 0 10*3/uL (ref 0.0–0.1)
Immature Granulocytes: 0 %
LYMPHS: 16 %
Lymphocytes Absolute: 2 10*3/uL (ref 0.7–3.1)
MCH: 29.1 pg (ref 26.6–33.0)
MCHC: 34.7 g/dL (ref 31.5–35.7)
MCV: 84 fL (ref 79–97)
Monocytes Absolute: 1.1 10*3/uL — ABNORMAL HIGH (ref 0.1–0.9)
Monocytes: 9 %
Neutrophils Absolute: 9 10*3/uL — ABNORMAL HIGH (ref 1.4–7.0)
Neutrophils: 73 %
PLATELETS: 373 10*3/uL (ref 150–379)
RBC: 5.08 x10E6/uL (ref 4.14–5.80)
RDW: 15.2 % (ref 12.3–15.4)
WBC: 12.3 10*3/uL — ABNORMAL HIGH (ref 3.4–10.8)

## 2016-02-29 LAB — CMP14+EGFR
ALBUMIN: 4 g/dL (ref 3.6–4.8)
ALT: 7 IU/L (ref 0–44)
AST: 10 IU/L (ref 0–40)
Albumin/Globulin Ratio: 1.3 (ref 1.2–2.2)
Alkaline Phosphatase: 102 IU/L (ref 39–117)
BILIRUBIN TOTAL: 0.3 mg/dL (ref 0.0–1.2)
BUN / CREAT RATIO: 14 (ref 10–24)
BUN: 13 mg/dL (ref 8–27)
CO2: 29 mmol/L (ref 18–29)
Calcium: 9.5 mg/dL (ref 8.6–10.2)
Chloride: 92 mmol/L — ABNORMAL LOW (ref 96–106)
Creatinine, Ser: 0.92 mg/dL (ref 0.76–1.27)
GFR calc non Af Amer: 85 mL/min/{1.73_m2} (ref >59)
GFR, EST AFRICAN AMERICAN: 98 mL/min/{1.73_m2} (ref >59)
GLOBULIN, TOTAL: 3.1 g/dL (ref 1.5–4.5)
Glucose: 93 mg/dL (ref 65–99)
POTASSIUM: 3.9 mmol/L (ref 3.5–5.2)
SODIUM: 139 mmol/L (ref 134–144)
TOTAL PROTEIN: 7.1 g/dL (ref 6.0–8.5)

## 2016-02-29 LAB — LIPID PANEL
Chol/HDL Ratio: 4.7 ratio units (ref 0.0–5.0)
Cholesterol, Total: 121 mg/dL (ref 100–199)
HDL: 26 mg/dL — ABNORMAL LOW (ref >39)
LDL Calculated: 48 mg/dL (ref 0–99)
Triglycerides: 236 mg/dL — ABNORMAL HIGH (ref 0–149)
VLDL Cholesterol Cal: 47 mg/dL — ABNORMAL HIGH (ref 5–40)

## 2016-03-07 ENCOUNTER — Other Ambulatory Visit: Payer: Self-pay | Admitting: Family Medicine

## 2016-03-21 ENCOUNTER — Ambulatory Visit: Payer: Medicare Other | Admitting: Family Medicine

## 2016-03-26 ENCOUNTER — Ambulatory Visit: Payer: Medicare Other | Admitting: Family Medicine

## 2016-03-27 ENCOUNTER — Other Ambulatory Visit: Payer: Self-pay | Admitting: Family Medicine

## 2016-03-27 DIAGNOSIS — F419 Anxiety disorder, unspecified: Principal | ICD-10-CM

## 2016-03-27 DIAGNOSIS — F32A Depression, unspecified: Secondary | ICD-10-CM

## 2016-03-27 DIAGNOSIS — F329 Major depressive disorder, single episode, unspecified: Secondary | ICD-10-CM

## 2016-03-28 ENCOUNTER — Ambulatory Visit (INDEPENDENT_AMBULATORY_CARE_PROVIDER_SITE_OTHER): Payer: Medicare Other | Admitting: Family Medicine

## 2016-03-28 ENCOUNTER — Encounter: Payer: Self-pay | Admitting: Family Medicine

## 2016-03-28 VITALS — BP 138/64 | HR 72 | Temp 97.3°F | Ht 69.0 in | Wt 181.1 lb

## 2016-03-28 DIAGNOSIS — F419 Anxiety disorder, unspecified: Principal | ICD-10-CM

## 2016-03-28 DIAGNOSIS — F32A Depression, unspecified: Secondary | ICD-10-CM

## 2016-03-28 DIAGNOSIS — F418 Other specified anxiety disorders: Secondary | ICD-10-CM

## 2016-03-28 DIAGNOSIS — J441 Chronic obstructive pulmonary disease with (acute) exacerbation: Secondary | ICD-10-CM

## 2016-03-28 DIAGNOSIS — F329 Major depressive disorder, single episode, unspecified: Secondary | ICD-10-CM

## 2016-03-28 MED ORDER — AZITHROMYCIN 250 MG PO TABS: ORAL_TABLET | ORAL | 0 refills | Status: DC

## 2016-03-28 MED ORDER — DULOXETINE HCL 60 MG PO CPEP: 60.0000 mg | ORAL_CAPSULE | Freq: Every day | ORAL | 1 refills | Status: DC

## 2016-03-28 MED ORDER — DULOXETINE HCL 30 MG PO CPEP: 30.0000 mg | ORAL_CAPSULE | Freq: Every day | ORAL | 3 refills | Status: DC

## 2016-03-28 MED ORDER — PREDNISONE 20 MG PO TABS: ORAL_TABLET | ORAL | 0 refills | Status: DC

## 2016-03-28 NOTE — Progress Notes (Signed)
BP 138/64   Pulse 72   Temp 97.3 F (36.3 C) (Oral)   Ht 5\' 9"  (1.753 m)   Wt 181 lb 2 oz (82.2 kg)   BMI 26.75 kg/m    Subjective:    Patient ID: Ronald [name], male    DOB: [birth year], 70 y.o.   MRN: [id number]  HPI: Ronald [name] is a 70 y.o. male presenting on 03/28/2016 for Anxiety (followup) and Left ear pain (x 2 weeks, sinus congestion)   HPI Anxiety and depression follow-up Patient is coming in today for an anxiety and depression follow-up. He is currently on Prozac and hydroxyzine and he feels like they are not working. He feels like he is depressed and sad all of the time and he has been on the Prozac for quite many years. He denies any suicidal ideations or thoughts for himself. He was on Xanax previously but has not been on it for at least a few months but he tends to like that and wants to consider going back. He is having some issues with sleep but it is getting better. He says realistically is been feeling really bad for the last 2 weeks because he is also been having a cold and illness going on.  Cough and congestion and wheezing Patient has been having cough and congestion and wheezing this been going on for the past 2 weeks. He has been using his inhalers without much success. He denies any fevers or chills or shortness of breath. He has been having a productive cough some of the time but mostly nonproductive. He has not used any over-the-counter medications to help her yet. He denies any sick contacts that he knows of. He says he has worse coughing and wheezing at night.  Relevant past medical, surgical, family and social history reviewed and updated as indicated. Interim medical history since our last visit reviewed. Allergies and medications reviewed and updated.  Review of Systems  Constitutional: Negative for chills and fever.  HENT: Positive for congestion, postnasal drip, rhinorrhea, sinus pressure, sneezing and sore throat. Negative for ear discharge, ear pain  and voice change.   Eyes: Negative for pain, discharge, redness and visual disturbance.  Respiratory: Positive for cough and wheezing. Negative for shortness of breath.   Cardiovascular: Negative for chest pain and leg swelling.  Musculoskeletal: Negative for gait problem.  Skin: Negative for rash.  Psychiatric/Behavioral: Positive for dysphoric mood and sleep disturbance. Negative for self-injury and suicidal ideas. The patient is nervous/anxious.   All other systems reviewed and are negative.   Per HPI unless specifically indicated above      Objective:    BP 138/64   Pulse 72   Temp 97.3 F (36.3 C) (Oral)   Ht 5\' 9"  (1.753 m)   Wt 181 lb 2 oz (82.2 kg)   BMI 26.75 kg/m   Wt Readings from Last 3 Encounters:  03/28/16 181 lb 2 oz (82.2 kg)  02/28/16 194 lb 6 oz (88.2 kg)  02/14/16 193 lb 3.2 oz (87.6 kg)    Physical Exam  Constitutional: He is oriented to person, place, and time. He appears well-developed and well-nourished. No distress.  HENT:  Right Ear: Tympanic membrane, external ear and ear canal normal.  Left Ear: Tympanic membrane, external ear and ear canal normal.  Nose: Mucosal edema and rhinorrhea present. No sinus tenderness. No epistaxis. Right sinus exhibits maxillary sinus tenderness. Right sinus exhibits no frontal sinus tenderness. Left sinus exhibits maxillary sinus tenderness. Left  sinus exhibits no frontal sinus tenderness.  Mouth/Throat: Uvula is midline and mucous membranes are normal. Posterior oropharyngeal edema and posterior oropharyngeal erythema present. No oropharyngeal exudate or tonsillar abscesses.  Eyes: Conjunctivae and EOM are normal. Pupils are equal, round, and reactive to light. Right eye exhibits no discharge. No scleral icterus.  Neck: Neck supple. No thyromegaly present.  Cardiovascular: Normal rate, regular rhythm, normal heart sounds and intact distal pulses.   No murmur heard. Pulmonary/Chest: Effort normal and breath sounds  normal. No respiratory distress. He has no wheezes. He has no rales.  Musculoskeletal: Normal range of motion. He exhibits no edema.  Lymphadenopathy:    He has no cervical adenopathy.  Neurological: He is alert and oriented to person, place, and time. Coordination normal.  Skin: Skin is warm and dry. No rash noted. He is not diaphoretic.  Psychiatric: His behavior is normal. Judgment and thought content normal. His mood appears anxious. He exhibits a depressed mood. He expresses no suicidal ideation. He expresses no suicidal plans.  Nursing note and vitals reviewed.     Assessment & Plan:   Problem List Items Addressed This Visit      Other   Anxiety and depression - Primary   Relevant Medications   DULoxetine (CYMBALTA) 30 MG capsule   DULoxetine (CYMBALTA) 60 MG capsule    Other Visit Diagnoses    COPD exacerbation ([place])       Relevant Medications   azithromycin (ZITHROMAX) 250 MG tablet   predniSONE (DELTASONE) 20 MG tablet       Follow up plan: Return in about 4 weeks (around 04/25/2016), or if symptoms worsen or fail to improve, for Recheck anxiety and depression.  Counseling provided for all of the vaccine components No orders of the defined types were placed in this encounter.   Caryl Pina, MD [facility] 03/28/2016, 2:52 PM

## 2016-04-28 ENCOUNTER — Ambulatory Visit: Payer: Medicare Other | Admitting: Family Medicine

## 2016-04-29 ENCOUNTER — Encounter: Payer: Self-pay | Admitting: Family Medicine

## 2016-05-02 ENCOUNTER — Other Ambulatory Visit: Payer: Self-pay | Admitting: Family Medicine

## 2016-05-02 DIAGNOSIS — F419 Anxiety disorder, unspecified: Principal | ICD-10-CM

## 2016-05-02 DIAGNOSIS — F32A Depression, unspecified: Secondary | ICD-10-CM

## 2016-05-02 DIAGNOSIS — F329 Major depressive disorder, single episode, unspecified: Secondary | ICD-10-CM

## 2016-05-05 ENCOUNTER — Encounter: Payer: Self-pay | Admitting: Family Medicine

## 2016-05-05 ENCOUNTER — Ambulatory Visit (INDEPENDENT_AMBULATORY_CARE_PROVIDER_SITE_OTHER): Payer: Medicare Other | Admitting: Family Medicine

## 2016-05-05 ENCOUNTER — Encounter (INDEPENDENT_AMBULATORY_CARE_PROVIDER_SITE_OTHER): Payer: Self-pay

## 2016-05-05 VITALS — BP 121/60 | HR 71 | Temp 98.0°F | Ht 69.0 in | Wt 179.1 lb

## 2016-05-05 DIAGNOSIS — F418 Other specified anxiety disorders: Secondary | ICD-10-CM | POA: Diagnosis not present

## 2016-05-05 DIAGNOSIS — F329 Major depressive disorder, single episode, unspecified: Secondary | ICD-10-CM

## 2016-05-05 DIAGNOSIS — F419 Anxiety disorder, unspecified: Principal | ICD-10-CM

## 2016-05-05 DIAGNOSIS — F32A Depression, unspecified: Secondary | ICD-10-CM

## 2016-05-05 MED ORDER — VENLAFAXINE HCL ER 75 MG PO CP24: 75.0000 mg | ORAL_CAPSULE | Freq: Every day | ORAL | 1 refills | Status: DC

## 2016-05-05 MED ORDER — HYDROXYZINE HCL 50 MG PO TABS: 50.0000 mg | ORAL_TABLET | Freq: Three times a day (TID) | ORAL | 2 refills | Status: DC | PRN
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
## 2016-05-13 ENCOUNTER — Other Ambulatory Visit: Payer: Self-pay | Admitting: Family Medicine

## 2016-05-13 ENCOUNTER — Other Ambulatory Visit: Payer: Self-pay | Admitting: Cardiovascular Disease

## 2016-05-13 NOTE — Telephone Encounter (Signed)
Pt last seen 05/05/2016  Please review and advise

## 2016-05-13 NOTE — Telephone Encounter (Signed)
Go ahead and call in refill 

## 2016-05-20 ENCOUNTER — Other Ambulatory Visit: Payer: Self-pay | Admitting: Family Medicine

## 2016-05-21 NOTE — Telephone Encounter (Signed)
Go ahead and call in a refill for Lyrica

## 2016-05-21 NOTE — Telephone Encounter (Signed)
rx called into pharmacy

## 2016-05-21 NOTE — Telephone Encounter (Signed)
Last filled 04/08/16, last seen 02/28/16, route to pool for call in

## 2016-06-05 ENCOUNTER — Ambulatory Visit: Payer: Medicare Other | Admitting: Family Medicine

## 2016-06-12 ENCOUNTER — Ambulatory Visit: Payer: Medicare Other | Admitting: Family Medicine

## 2016-06-13 ENCOUNTER — Other Ambulatory Visit: Payer: Self-pay | Admitting: Family Medicine

## 2016-06-13 DIAGNOSIS — F419 Anxiety disorder, unspecified: Principal | ICD-10-CM

## 2016-06-13 DIAGNOSIS — F329 Major depressive disorder, single episode, unspecified: Secondary | ICD-10-CM

## 2016-06-13 DIAGNOSIS — F32A Depression, unspecified: Secondary | ICD-10-CM

## 2016-06-25 ENCOUNTER — Ambulatory Visit: Payer: Medicare Other | Admitting: Family Medicine

## 2016-07-15 ENCOUNTER — Other Ambulatory Visit: Payer: Self-pay | Admitting: Family Medicine

## 2016-07-15 ENCOUNTER — Ambulatory Visit: Payer: Medicare Other | Admitting: Family Medicine

## 2016-07-15 DIAGNOSIS — F32A Depression, unspecified: Secondary | ICD-10-CM

## 2016-07-15 DIAGNOSIS — F329 Major depressive disorder, single episode, unspecified: Secondary | ICD-10-CM

## 2016-07-15 DIAGNOSIS — F419 Anxiety disorder, unspecified: Principal | ICD-10-CM

## 2016-07-25 ENCOUNTER — Telehealth: Payer: Self-pay | Admitting: Family Medicine

## 2016-07-25 DIAGNOSIS — Z8673 Personal history of transient ischemic attack (TIA), and cerebral infarction without residual deficits: Secondary | ICD-10-CM | POA: Diagnosis not present

## 2016-07-25 DIAGNOSIS — R29818 Other symptoms and signs involving the nervous system: Secondary | ICD-10-CM | POA: Diagnosis not present

## 2016-07-25 DIAGNOSIS — Z01810 Encounter for preprocedural cardiovascular examination: Secondary | ICD-10-CM | POA: Diagnosis not present

## 2016-07-25 DIAGNOSIS — S098XXA Other specified injuries of head, initial encounter: Secondary | ICD-10-CM | POA: Diagnosis not present

## 2016-07-25 DIAGNOSIS — S2242XA Multiple fractures of ribs, left side, initial encounter for closed fracture: Secondary | ICD-10-CM | POA: Diagnosis not present

## 2016-07-25 DIAGNOSIS — S0181XA Laceration without foreign body of other part of head, initial encounter: Secondary | ICD-10-CM | POA: Diagnosis not present

## 2016-07-25 DIAGNOSIS — S0190XA Unspecified open wound of unspecified part of head, initial encounter: Secondary | ICD-10-CM | POA: Diagnosis not present

## 2016-07-25 DIAGNOSIS — S32009A Unspecified fracture of unspecified lumbar vertebra, initial encounter for closed fracture: Secondary | ICD-10-CM | POA: Insufficient documentation

## 2016-07-25 DIAGNOSIS — R451 Restlessness and agitation: Secondary | ICD-10-CM | POA: Diagnosis not present

## 2016-07-25 DIAGNOSIS — I619 Nontraumatic intracerebral hemorrhage, unspecified: Secondary | ICD-10-CM | POA: Diagnosis not present

## 2016-07-25 DIAGNOSIS — K921 Melena: Secondary | ICD-10-CM | POA: Diagnosis not present

## 2016-07-25 DIAGNOSIS — K91870 Postprocedural hematoma of a digestive system organ or structure following a digestive system procedure: Secondary | ICD-10-CM | POA: Diagnosis not present

## 2016-07-25 DIAGNOSIS — S22010D Wedge compression fracture of first thoracic vertebra, subsequent encounter for fracture with routine healing: Secondary | ICD-10-CM | POA: Diagnosis not present

## 2016-07-25 DIAGNOSIS — S06369A Traumatic hemorrhage of cerebrum, unspecified, with loss of consciousness of unspecified duration, initial encounter: Secondary | ICD-10-CM | POA: Diagnosis not present

## 2016-07-25 DIAGNOSIS — S2243XA Multiple fractures of ribs, bilateral, initial encounter for closed fracture: Secondary | ICD-10-CM | POA: Diagnosis not present

## 2016-07-25 DIAGNOSIS — S12600A Unspecified displaced fracture of seventh cervical vertebra, initial encounter for closed fracture: Secondary | ICD-10-CM | POA: Diagnosis not present

## 2016-07-25 DIAGNOSIS — S3210XA Unspecified fracture of sacrum, initial encounter for closed fracture: Secondary | ICD-10-CM | POA: Diagnosis not present

## 2016-07-25 DIAGNOSIS — I499 Cardiac arrhythmia, unspecified: Secondary | ICD-10-CM | POA: Diagnosis not present

## 2016-07-25 DIAGNOSIS — S22019A Unspecified fracture of first thoracic vertebra, initial encounter for closed fracture: Secondary | ICD-10-CM | POA: Insufficient documentation

## 2016-07-25 DIAGNOSIS — J449 Chronic obstructive pulmonary disease, unspecified: Secondary | ICD-10-CM | POA: Diagnosis not present

## 2016-07-25 DIAGNOSIS — R918 Other nonspecific abnormal finding of lung field: Secondary | ICD-10-CM | POA: Diagnosis not present

## 2016-07-25 DIAGNOSIS — S2699XA Other injury of heart, unspecified with or without hemopericardium, initial encounter: Secondary | ICD-10-CM | POA: Diagnosis not present

## 2016-07-25 DIAGNOSIS — S12500A Unspecified displaced fracture of sixth cervical vertebra, initial encounter for closed fracture: Secondary | ICD-10-CM | POA: Diagnosis not present

## 2016-07-25 DIAGNOSIS — S0093XA Contusion of unspecified part of head, initial encounter: Secondary | ICD-10-CM | POA: Diagnosis not present

## 2016-07-25 DIAGNOSIS — I161 Hypertensive emergency: Secondary | ICD-10-CM | POA: Diagnosis not present

## 2016-07-25 DIAGNOSIS — R061 Stridor: Secondary | ICD-10-CM | POA: Diagnosis not present

## 2016-07-25 DIAGNOSIS — A4101 Sepsis due to Methicillin susceptible Staphylococcus aureus: Secondary | ICD-10-CM | POA: Diagnosis not present

## 2016-07-25 DIAGNOSIS — I493 Ventricular premature depolarization: Secondary | ICD-10-CM | POA: Diagnosis not present

## 2016-07-25 DIAGNOSIS — J810 Acute pulmonary edema: Secondary | ICD-10-CM | POA: Diagnosis not present

## 2016-07-25 DIAGNOSIS — S139XXA Sprain of joints and ligaments of unspecified parts of neck, initial encounter: Secondary | ICD-10-CM | POA: Diagnosis not present

## 2016-07-25 DIAGNOSIS — R1900 Intra-abdominal and pelvic swelling, mass and lump, unspecified site: Secondary | ICD-10-CM | POA: Diagnosis not present

## 2016-07-25 DIAGNOSIS — I4510 Unspecified right bundle-branch block: Secondary | ICD-10-CM | POA: Diagnosis not present

## 2016-07-25 DIAGNOSIS — J95851 Ventilator associated pneumonia: Secondary | ICD-10-CM | POA: Diagnosis not present

## 2016-07-25 DIAGNOSIS — S2221XA Fracture of manubrium, initial encounter for closed fracture: Secondary | ICD-10-CM | POA: Diagnosis not present

## 2016-07-25 DIAGNOSIS — M19031 Primary osteoarthritis, right wrist: Secondary | ICD-10-CM | POA: Diagnosis not present

## 2016-07-25 DIAGNOSIS — S32019A Unspecified fracture of first lumbar vertebra, initial encounter for closed fracture: Secondary | ICD-10-CM | POA: Diagnosis not present

## 2016-07-25 DIAGNOSIS — R001 Bradycardia, unspecified: Secondary | ICD-10-CM | POA: Diagnosis not present

## 2016-07-25 DIAGNOSIS — J9600 Acute respiratory failure, unspecified whether with hypoxia or hypercapnia: Secondary | ICD-10-CM | POA: Diagnosis not present

## 2016-07-25 DIAGNOSIS — E441 Mild protein-calorie malnutrition: Secondary | ICD-10-CM | POA: Diagnosis not present

## 2016-07-25 DIAGNOSIS — S3219XA Other fracture of sacrum, initial encounter for closed fracture: Secondary | ICD-10-CM | POA: Diagnosis not present

## 2016-07-25 DIAGNOSIS — R404 Transient alteration of awareness: Secondary | ICD-10-CM | POA: Diagnosis not present

## 2016-07-25 DIAGNOSIS — J189 Pneumonia, unspecified organism: Secondary | ICD-10-CM | POA: Diagnosis not present

## 2016-07-25 DIAGNOSIS — M4306 Spondylolysis, lumbar region: Secondary | ICD-10-CM | POA: Diagnosis not present

## 2016-07-25 DIAGNOSIS — S2222XA Fracture of body of sternum, initial encounter for closed fracture: Secondary | ICD-10-CM | POA: Insufficient documentation

## 2016-07-25 DIAGNOSIS — Z743 Need for continuous supervision: Secondary | ICD-10-CM | POA: Diagnosis not present

## 2016-07-25 DIAGNOSIS — M4305 Spondylolysis, thoracolumbar region: Secondary | ICD-10-CM | POA: Diagnosis not present

## 2016-07-25 DIAGNOSIS — K9413 Enterostomy malfunction: Secondary | ICD-10-CM | POA: Diagnosis not present

## 2016-07-25 DIAGNOSIS — S32029A Unspecified fracture of second lumbar vertebra, initial encounter for closed fracture: Secondary | ICD-10-CM | POA: Diagnosis not present

## 2016-07-25 DIAGNOSIS — K922 Gastrointestinal hemorrhage, unspecified: Secondary | ICD-10-CM | POA: Diagnosis not present

## 2016-07-25 DIAGNOSIS — Z9911 Dependence on respirator [ventilator] status: Secondary | ICD-10-CM | POA: Diagnosis not present

## 2016-07-25 DIAGNOSIS — S12400A Unspecified displaced fracture of fifth cervical vertebra, initial encounter for closed fracture: Secondary | ICD-10-CM | POA: Diagnosis not present

## 2016-07-25 DIAGNOSIS — S062X9A Diffuse traumatic brain injury with loss of consciousness of unspecified duration, initial encounter: Secondary | ICD-10-CM | POA: Diagnosis not present

## 2016-07-25 DIAGNOSIS — R000 Tachycardia, unspecified: Secondary | ICD-10-CM | POA: Insufficient documentation

## 2016-07-25 DIAGNOSIS — E878 Other disorders of electrolyte and fluid balance, not elsewhere classified: Secondary | ICD-10-CM | POA: Diagnosis not present

## 2016-07-25 DIAGNOSIS — D62 Acute posthemorrhagic anemia: Secondary | ICD-10-CM | POA: Diagnosis not present

## 2016-07-25 DIAGNOSIS — Z430 Encounter for attention to tracheostomy: Secondary | ICD-10-CM | POA: Diagnosis not present

## 2016-07-25 DIAGNOSIS — S129XXA Fracture of neck, unspecified, initial encounter: Secondary | ICD-10-CM | POA: Insufficient documentation

## 2016-07-25 DIAGNOSIS — M160 Bilateral primary osteoarthritis of hip: Secondary | ICD-10-CM | POA: Diagnosis not present

## 2016-07-25 DIAGNOSIS — R6520 Severe sepsis without septic shock: Secondary | ICD-10-CM | POA: Diagnosis not present

## 2016-07-25 DIAGNOSIS — S22010A Wedge compression fracture of first thoracic vertebra, initial encounter for closed fracture: Secondary | ICD-10-CM | POA: Diagnosis not present

## 2016-07-25 DIAGNOSIS — S12690A Other displaced fracture of seventh cervical vertebra, initial encounter for closed fracture: Secondary | ICD-10-CM | POA: Diagnosis not present

## 2016-07-25 DIAGNOSIS — R937 Abnormal findings on diagnostic imaging of other parts of musculoskeletal system: Secondary | ICD-10-CM | POA: Diagnosis not present

## 2016-07-25 DIAGNOSIS — M19041 Primary osteoarthritis, right hand: Secondary | ICD-10-CM | POA: Diagnosis not present

## 2016-07-25 DIAGNOSIS — M19042 Primary osteoarthritis, left hand: Secondary | ICD-10-CM | POA: Diagnosis not present

## 2016-07-25 DIAGNOSIS — E43 Unspecified severe protein-calorie malnutrition: Secondary | ICD-10-CM | POA: Diagnosis not present

## 2016-07-25 DIAGNOSIS — Z4682 Encounter for fitting and adjustment of non-vascular catheter: Secondary | ICD-10-CM | POA: Diagnosis not present

## 2016-07-25 DIAGNOSIS — J811 Chronic pulmonary edema: Secondary | ICD-10-CM | POA: Diagnosis not present

## 2016-07-25 DIAGNOSIS — R0682 Tachypnea, not elsewhere classified: Secondary | ICD-10-CM | POA: Insufficient documentation

## 2016-07-25 DIAGNOSIS — T8182XA Emphysema (subcutaneous) resulting from a procedure, initial encounter: Secondary | ICD-10-CM | POA: Diagnosis not present

## 2016-07-25 DIAGNOSIS — S2241XA Multiple fractures of ribs, right side, initial encounter for closed fracture: Secondary | ICD-10-CM | POA: Insufficient documentation

## 2016-07-25 DIAGNOSIS — S12590A Other displaced fracture of sixth cervical vertebra, initial encounter for closed fracture: Secondary | ICD-10-CM | POA: Diagnosis not present

## 2016-07-25 DIAGNOSIS — G8911 Acute pain due to trauma: Secondary | ICD-10-CM | POA: Diagnosis not present

## 2016-07-25 DIAGNOSIS — S06359A Traumatic hemorrhage of left cerebrum with loss of consciousness of unspecified duration, initial encounter: Secondary | ICD-10-CM | POA: Diagnosis not present

## 2016-07-25 DIAGNOSIS — I491 Atrial premature depolarization: Secondary | ICD-10-CM | POA: Diagnosis not present

## 2016-07-25 DIAGNOSIS — S06350A Traumatic hemorrhage of left cerebrum without loss of consciousness, initial encounter: Secondary | ICD-10-CM | POA: Diagnosis not present

## 2016-07-25 DIAGNOSIS — S270XXA Traumatic pneumothorax, initial encounter: Secondary | ICD-10-CM | POA: Diagnosis not present

## 2016-07-25 DIAGNOSIS — E639 Nutritional deficiency, unspecified: Secondary | ICD-10-CM | POA: Diagnosis not present

## 2016-07-25 DIAGNOSIS — M180 Bilateral primary osteoarthritis of first carpometacarpal joints: Secondary | ICD-10-CM | POA: Diagnosis not present

## 2016-07-25 DIAGNOSIS — S32119A Unspecified Zone I fracture of sacrum, initial encounter for closed fracture: Secondary | ICD-10-CM | POA: Diagnosis not present

## 2016-07-25 DIAGNOSIS — J9601 Acute respiratory failure with hypoxia: Secondary | ICD-10-CM | POA: Diagnosis not present

## 2016-07-25 DIAGNOSIS — I700 Atherosclerosis of aorta: Secondary | ICD-10-CM | POA: Diagnosis not present

## 2016-07-25 DIAGNOSIS — Z431 Encounter for attention to gastrostomy: Secondary | ICD-10-CM | POA: Diagnosis not present

## 2016-07-25 DIAGNOSIS — I10 Essential (primary) hypertension: Secondary | ICD-10-CM | POA: Diagnosis not present

## 2016-07-25 DIAGNOSIS — S069X0A Unspecified intracranial injury without loss of consciousness, initial encounter: Secondary | ICD-10-CM | POA: Diagnosis not present

## 2016-07-25 DIAGNOSIS — R0989 Other specified symptoms and signs involving the circulatory and respiratory systems: Secondary | ICD-10-CM | POA: Diagnosis not present

## 2016-07-25 DIAGNOSIS — Z978 Presence of other specified devices: Secondary | ICD-10-CM | POA: Diagnosis not present

## 2016-07-25 DIAGNOSIS — S22018A Other fracture of first thoracic vertebra, initial encounter for closed fracture: Secondary | ICD-10-CM | POA: Diagnosis not present

## 2016-07-25 DIAGNOSIS — K264 Chronic or unspecified duodenal ulcer with hemorrhage: Secondary | ICD-10-CM | POA: Diagnosis not present

## 2016-07-25 DIAGNOSIS — R578 Other shock: Secondary | ICD-10-CM | POA: Diagnosis not present

## 2016-07-25 DIAGNOSIS — S06340A Traumatic hemorrhage of right cerebrum without loss of consciousness, initial encounter: Secondary | ICD-10-CM | POA: Diagnosis not present

## 2016-07-25 DIAGNOSIS — I517 Cardiomegaly: Secondary | ICD-10-CM | POA: Diagnosis not present

## 2016-07-25 DIAGNOSIS — S0091XA Abrasion of unspecified part of head, initial encounter: Secondary | ICD-10-CM | POA: Diagnosis not present

## 2016-07-25 DIAGNOSIS — M19032 Primary osteoarthritis, left wrist: Secondary | ICD-10-CM | POA: Diagnosis not present

## 2016-07-25 DIAGNOSIS — J930 Spontaneous tension pneumothorax: Secondary | ICD-10-CM | POA: Diagnosis not present

## 2016-07-25 DIAGNOSIS — I6523 Occlusion and stenosis of bilateral carotid arteries: Secondary | ICD-10-CM | POA: Diagnosis not present

## 2016-07-25 DIAGNOSIS — E785 Hyperlipidemia, unspecified: Secondary | ICD-10-CM | POA: Diagnosis not present

## 2016-07-25 DIAGNOSIS — K269 Duodenal ulcer, unspecified as acute or chronic, without hemorrhage or perforation: Secondary | ICD-10-CM | POA: Diagnosis not present

## 2016-07-25 DIAGNOSIS — D500 Iron deficiency anemia secondary to blood loss (chronic): Secondary | ICD-10-CM | POA: Diagnosis not present

## 2016-07-25 DIAGNOSIS — S22029A Unspecified fracture of second thoracic vertebra, initial encounter for closed fracture: Secondary | ICD-10-CM | POA: Diagnosis not present

## 2016-07-25 DIAGNOSIS — S30811A Abrasion of abdominal wall, initial encounter: Secondary | ICD-10-CM | POA: Diagnosis not present

## 2016-07-25 DIAGNOSIS — S062X0A Diffuse traumatic brain injury without loss of consciousness, initial encounter: Secondary | ICD-10-CM | POA: Diagnosis not present

## 2016-07-25 DIAGNOSIS — J9690 Respiratory failure, unspecified, unspecified whether with hypoxia or hypercapnia: Secondary | ICD-10-CM | POA: Diagnosis not present

## 2016-07-25 DIAGNOSIS — J440 Chronic obstructive pulmonary disease with acute lower respiratory infection: Secondary | ICD-10-CM | POA: Diagnosis not present

## 2016-07-25 DIAGNOSIS — T1490XA Injury, unspecified, initial encounter: Secondary | ICD-10-CM | POA: Diagnosis not present

## 2016-07-25 DIAGNOSIS — J90 Pleural effusion, not elsewhere classified: Secondary | ICD-10-CM | POA: Diagnosis not present

## 2016-07-25 DIAGNOSIS — E873 Alkalosis: Secondary | ICD-10-CM | POA: Diagnosis not present

## 2016-07-25 DIAGNOSIS — I615 Nontraumatic intracerebral hemorrhage, intraventricular: Secondary | ICD-10-CM | POA: Diagnosis not present

## 2016-07-25 NOTE — Telephone Encounter (Signed)
Medication list faxed to [facility]

## 2016-08-14 ENCOUNTER — Telehealth: Payer: Self-pay | Admitting: Cardiovascular Disease

## 2016-08-14 NOTE — Telephone Encounter (Signed)
Numerous attempts to contact patient with recall letters. Unable to reach by telephone. with no success.  Ronald [name] [[id number]] 06/26/2015 1:56 PM New [10]    [System] 03/09/2016 11:00 PM Notification Sent [name]   Ronald [name] [[id number]] 06/12/2016 3:25 PM Notification Sent [20]   Ronald [name] [[id number]] 08/14/2016 11:04 AM Notification Sent [20]  Scheduling Instructions  1 yr

## 2016-08-16 ENCOUNTER — Telehealth: Payer: Self-pay | Admitting: Family Medicine

## 2016-08-18 NOTE — Telephone Encounter (Signed)
Thanks for the information
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
## 2016-08-20 ENCOUNTER — Inpatient Hospital Stay
Admission: AD | Admit: 2016-08-20 | Discharge: 2016-08-23 | Disposition: A | Discharge status: Still In Hospital | Payer: Self-pay | Source: Ambulatory Visit | Attending: Internal Medicine | Admitting: Internal Medicine

## 2016-08-20 ENCOUNTER — Other Ambulatory Visit (HOSPITAL_COMMUNITY): Payer: Self-pay

## 2016-08-20 DIAGNOSIS — K922 Gastrointestinal hemorrhage, unspecified: Secondary | ICD-10-CM | POA: Diagnosis not present

## 2016-08-20 DIAGNOSIS — Z7982 Long term (current) use of aspirin: Secondary | ICD-10-CM | POA: Diagnosis not present

## 2016-08-20 DIAGNOSIS — S2220XD Unspecified fracture of sternum, subsequent encounter for fracture with routine healing: Secondary | ICD-10-CM | POA: Diagnosis not present

## 2016-08-20 DIAGNOSIS — K5190 Ulcerative colitis, unspecified, without complications: Secondary | ICD-10-CM | POA: Diagnosis not present

## 2016-08-20 DIAGNOSIS — S2249XD Multiple fractures of ribs, unspecified side, subsequent encounter for fracture with routine healing: Secondary | ICD-10-CM | POA: Diagnosis not present

## 2016-08-20 DIAGNOSIS — R0602 Shortness of breath: Secondary | ICD-10-CM | POA: Diagnosis not present

## 2016-08-20 DIAGNOSIS — I252 Old myocardial infarction: Secondary | ICD-10-CM | POA: Diagnosis not present

## 2016-08-20 DIAGNOSIS — Z79899 Other long term (current) drug therapy: Secondary | ICD-10-CM | POA: Diagnosis not present

## 2016-08-20 DIAGNOSIS — J449 Chronic obstructive pulmonary disease, unspecified: Secondary | ICD-10-CM | POA: Diagnosis not present

## 2016-08-20 DIAGNOSIS — T07XXXA Unspecified multiple injuries, initial encounter: Secondary | ICD-10-CM | POA: Diagnosis not present

## 2016-08-20 DIAGNOSIS — J9621 Acute and chronic respiratory failure with hypoxia: Secondary | ICD-10-CM | POA: Diagnosis not present

## 2016-08-20 DIAGNOSIS — S129XXD Fracture of neck, unspecified, subsequent encounter: Secondary | ICD-10-CM | POA: Diagnosis not present

## 2016-08-20 DIAGNOSIS — S2220XA Unspecified fracture of sternum, initial encounter for closed fracture: Secondary | ICD-10-CM | POA: Diagnosis not present

## 2016-08-20 DIAGNOSIS — Z930 Tracheostomy status: Secondary | ICD-10-CM | POA: Diagnosis not present

## 2016-08-20 DIAGNOSIS — S3991XA Unspecified injury of abdomen, initial encounter: Secondary | ICD-10-CM | POA: Diagnosis not present

## 2016-08-20 DIAGNOSIS — S06890A Other specified intracranial injury without loss of consciousness, initial encounter: Secondary | ICD-10-CM | POA: Diagnosis not present

## 2016-08-20 DIAGNOSIS — T8189XA Other complications of procedures, not elsewhere classified, initial encounter: Secondary | ICD-10-CM | POA: Diagnosis not present

## 2016-08-20 DIAGNOSIS — S12090A Other displaced fracture of first cervical vertebra, initial encounter for closed fracture: Secondary | ICD-10-CM | POA: Diagnosis not present

## 2016-08-20 DIAGNOSIS — G464 Cerebellar stroke syndrome: Secondary | ICD-10-CM | POA: Diagnosis not present

## 2016-08-20 DIAGNOSIS — S12500A Unspecified displaced fracture of sixth cervical vertebra, initial encounter for closed fracture: Secondary | ICD-10-CM | POA: Diagnosis not present

## 2016-08-20 DIAGNOSIS — S3210XA Unspecified fracture of sacrum, initial encounter for closed fracture: Secondary | ICD-10-CM | POA: Diagnosis not present

## 2016-08-20 DIAGNOSIS — S06360D Traumatic hemorrhage of cerebrum, unspecified, without loss of consciousness, subsequent encounter: Secondary | ICD-10-CM | POA: Diagnosis not present

## 2016-08-20 DIAGNOSIS — S2249XA Multiple fractures of ribs, unspecified side, initial encounter for closed fracture: Secondary | ICD-10-CM | POA: Diagnosis not present

## 2016-08-20 DIAGNOSIS — Z955 Presence of coronary angioplasty implant and graft: Secondary | ICD-10-CM | POA: Diagnosis not present

## 2016-08-20 DIAGNOSIS — Z433 Encounter for attention to colostomy: Secondary | ICD-10-CM | POA: Diagnosis not present

## 2016-08-20 DIAGNOSIS — Z430 Encounter for attention to tracheostomy: Secondary | ICD-10-CM | POA: Diagnosis not present

## 2016-08-20 DIAGNOSIS — S2242XA Multiple fractures of ribs, left side, initial encounter for closed fracture: Secondary | ICD-10-CM | POA: Diagnosis not present

## 2016-08-20 DIAGNOSIS — Z0000 Encounter for general adult medical examination without abnormal findings: Secondary | ICD-10-CM

## 2016-08-20 DIAGNOSIS — Y939 Activity, unspecified: Secondary | ICD-10-CM | POA: Diagnosis not present

## 2016-08-20 DIAGNOSIS — I10 Essential (primary) hypertension: Secondary | ICD-10-CM | POA: Diagnosis not present

## 2016-08-20 DIAGNOSIS — F17210 Nicotine dependence, cigarettes, uncomplicated: Secondary | ICD-10-CM | POA: Diagnosis not present

## 2016-08-20 DIAGNOSIS — Z7984 Long term (current) use of oral hypoglycemic drugs: Secondary | ICD-10-CM | POA: Diagnosis not present

## 2016-08-20 DIAGNOSIS — E119 Type 2 diabetes mellitus without complications: Secondary | ICD-10-CM | POA: Diagnosis not present

## 2016-08-20 DIAGNOSIS — Z9911 Dependence on respirator [ventilator] status: Secondary | ICD-10-CM | POA: Diagnosis not present

## 2016-08-20 DIAGNOSIS — Y999 Unspecified external cause status: Secondary | ICD-10-CM | POA: Diagnosis not present

## 2016-08-20 DIAGNOSIS — Z720 Tobacco use: Secondary | ICD-10-CM | POA: Diagnosis not present

## 2016-08-20 DIAGNOSIS — J441 Chronic obstructive pulmonary disease with (acute) exacerbation: Secondary | ICD-10-CM | POA: Diagnosis not present

## 2016-08-20 DIAGNOSIS — R2689 Other abnormalities of gait and mobility: Secondary | ICD-10-CM | POA: Diagnosis not present

## 2016-08-20 DIAGNOSIS — S21109A Unspecified open wound of unspecified front wall of thorax without penetration into thoracic cavity, initial encounter: Secondary | ICD-10-CM | POA: Diagnosis not present

## 2016-08-20 DIAGNOSIS — I2510 Atherosclerotic heart disease of native coronary artery without angina pectoris: Secondary | ICD-10-CM | POA: Diagnosis not present

## 2016-08-20 DIAGNOSIS — R1312 Dysphagia, oropharyngeal phase: Secondary | ICD-10-CM | POA: Diagnosis not present

## 2016-08-20 DIAGNOSIS — R531 Weakness: Secondary | ICD-10-CM | POA: Diagnosis not present

## 2016-08-20 DIAGNOSIS — Y92410 Unspecified street and highway as the place of occurrence of the external cause: Secondary | ICD-10-CM | POA: Diagnosis not present

## 2016-08-20 DIAGNOSIS — R1310 Dysphagia, unspecified: Secondary | ICD-10-CM | POA: Diagnosis not present

## 2016-08-20 DIAGNOSIS — S12600A Unspecified displaced fracture of seventh cervical vertebra, initial encounter for closed fracture: Secondary | ICD-10-CM | POA: Diagnosis not present

## 2016-08-20 DIAGNOSIS — J9620 Acute and chronic respiratory failure, unspecified whether with hypoxia or hypercapnia: Secondary | ICD-10-CM | POA: Diagnosis not present

## 2016-08-20 DIAGNOSIS — Z4659 Encounter for fitting and adjustment of other gastrointestinal appliance and device: Secondary | ICD-10-CM

## 2016-08-20 DIAGNOSIS — S12100A Unspecified displaced fracture of second cervical vertebra, initial encounter for closed fracture: Secondary | ICD-10-CM | POA: Diagnosis not present

## 2016-08-20 DIAGNOSIS — M6281 Muscle weakness (generalized): Secondary | ICD-10-CM | POA: Diagnosis not present

## 2016-08-20 DIAGNOSIS — E46 Unspecified protein-calorie malnutrition: Secondary | ICD-10-CM | POA: Diagnosis not present

## 2016-08-20 DIAGNOSIS — R278 Other lack of coordination: Secondary | ICD-10-CM | POA: Diagnosis not present

## 2016-08-20 DIAGNOSIS — Z8673 Personal history of transient ischemic attack (TIA), and cerebral infarction without residual deficits: Secondary | ICD-10-CM | POA: Diagnosis not present

## 2016-08-20 DIAGNOSIS — J9600 Acute respiratory failure, unspecified whether with hypoxia or hypercapnia: Secondary | ICD-10-CM | POA: Diagnosis not present

## 2016-08-20 DIAGNOSIS — G459 Transient cerebral ischemic attack, unspecified: Secondary | ICD-10-CM | POA: Diagnosis not present

## 2016-08-20 DIAGNOSIS — S2243XA Multiple fractures of ribs, bilateral, initial encounter for closed fracture: Secondary | ICD-10-CM | POA: Diagnosis not present

## 2016-08-20 LAB — BLOOD GAS, ARTERIAL
Acid-Base Excess: 7.6 mmol/L — ABNORMAL HIGH (ref 0.0–2.0)
Bicarbonate: 31.2 mmol/L — ABNORMAL HIGH (ref 20.0–28.0)
O2 Saturation: 95.3 %
PCO2 ART: 41.2 mmHg (ref 32.0–48.0)
PH ART: 7.492 — AB (ref 7.350–7.450)
Patient temperature: 98.6
RATE: 15 resp/min
VT: 550 mL
pO2, Arterial: 74.6 mmHg — ABNORMAL LOW (ref 83.0–108.0)

## 2016-08-21 LAB — BASIC METABOLIC PANEL
Anion gap: 8 (ref 5–15)
BUN: 49 mg/dL — AB (ref 6–20)
CALCIUM: 8.5 mg/dL — AB (ref 8.9–10.3)
CO2: 30 mmol/L (ref 22–32)
CREATININE: 1.37 mg/dL — AB (ref 0.61–1.24)
Chloride: 105 mmol/L (ref 101–111)
GFR calc Af Amer: 60 mL/min — ABNORMAL LOW (ref >60)
GFR, EST NON AFRICAN AMERICAN: 51 mL/min — AB (ref >60)
Glucose, Bld: 178 mg/dL — ABNORMAL HIGH (ref 65–99)
Potassium: 4.7 mmol/L (ref 3.5–5.1)
SODIUM: 143 mmol/L (ref 135–145)

## 2016-08-21 LAB — CBC WITH DIFFERENTIAL/PLATELET
BASOS ABS: 0 10*3/uL (ref 0.0–0.1)
Basophils Relative: 0 %
EOS ABS: 0 10*3/uL (ref 0.0–0.7)
Eosinophils Relative: 0 %
HCT: 29.5 % — ABNORMAL LOW (ref 39.0–52.0)
Hemoglobin: 8.8 g/dL — ABNORMAL LOW (ref 13.0–17.0)
Lymphocytes Relative: 11 %
Lymphs Abs: 0.9 10*3/uL (ref 0.7–4.0)
MCH: 27.2 pg (ref 26.0–34.0)
MCHC: 29.8 g/dL — ABNORMAL LOW (ref 30.0–36.0)
MCV: 91.3 fL (ref 78.0–100.0)
MONO ABS: 0.5 10*3/uL (ref 0.1–1.0)
Monocytes Relative: 6 %
Neutro Abs: 6.6 10*3/uL (ref 1.7–7.7)
Neutrophils Relative %: 83 %
PLATELETS: 528 10*3/uL — AB (ref 150–400)
RBC: 3.23 MIL/uL — AB (ref 4.22–5.81)
RDW: 15.4 % (ref 11.5–15.5)
WBC: 8 10*3/uL (ref 4.0–10.5)

## 2016-08-23 ENCOUNTER — Emergency Department (HOSPITAL_COMMUNITY)
Admission: EM | Admit: 2016-08-23 | Discharge: 2016-08-24 | Disposition: A | Discharge status: Home / Self Care | Payer: Medicare Other | Attending: Emergency Medicine | Admitting: Emergency Medicine

## 2016-08-23 ENCOUNTER — Other Ambulatory Visit (HOSPITAL_COMMUNITY): Payer: Self-pay

## 2016-08-23 ENCOUNTER — Encounter (HOSPITAL_COMMUNITY): Payer: Self-pay | Admitting: Emergency Medicine

## 2016-08-23 DIAGNOSIS — Z7984 Long term (current) use of oral hypoglycemic drugs: Secondary | ICD-10-CM | POA: Diagnosis not present

## 2016-08-23 DIAGNOSIS — Y92410 Unspecified street and highway as the place of occurrence of the external cause: Secondary | ICD-10-CM | POA: Insufficient documentation

## 2016-08-23 DIAGNOSIS — E119 Type 2 diabetes mellitus without complications: Secondary | ICD-10-CM | POA: Insufficient documentation

## 2016-08-23 DIAGNOSIS — I10 Essential (primary) hypertension: Secondary | ICD-10-CM | POA: Insufficient documentation

## 2016-08-23 DIAGNOSIS — Z8673 Personal history of transient ischemic attack (TIA), and cerebral infarction without residual deficits: Secondary | ICD-10-CM | POA: Insufficient documentation

## 2016-08-23 DIAGNOSIS — S3991XA Unspecified injury of abdomen, initial encounter: Secondary | ICD-10-CM | POA: Diagnosis not present

## 2016-08-23 DIAGNOSIS — Z79899 Other long term (current) drug therapy: Secondary | ICD-10-CM | POA: Insufficient documentation

## 2016-08-23 DIAGNOSIS — Z955 Presence of coronary angioplasty implant and graft: Secondary | ICD-10-CM | POA: Diagnosis not present

## 2016-08-23 DIAGNOSIS — J449 Chronic obstructive pulmonary disease, unspecified: Secondary | ICD-10-CM | POA: Insufficient documentation

## 2016-08-23 DIAGNOSIS — Z7982 Long term (current) use of aspirin: Secondary | ICD-10-CM | POA: Diagnosis not present

## 2016-08-23 DIAGNOSIS — F17210 Nicotine dependence, cigarettes, uncomplicated: Secondary | ICD-10-CM | POA: Insufficient documentation

## 2016-08-23 DIAGNOSIS — I2510 Atherosclerotic heart disease of native coronary artery without angina pectoris: Secondary | ICD-10-CM | POA: Insufficient documentation

## 2016-08-23 DIAGNOSIS — Y999 Unspecified external cause status: Secondary | ICD-10-CM | POA: Insufficient documentation

## 2016-08-23 DIAGNOSIS — Y939 Activity, unspecified: Secondary | ICD-10-CM | POA: Insufficient documentation

## 2016-08-23 DIAGNOSIS — I252 Old myocardial infarction: Secondary | ICD-10-CM | POA: Insufficient documentation

## 2016-08-23 DIAGNOSIS — T148XXA Other injury of unspecified body region, initial encounter: Secondary | ICD-10-CM

## 2016-08-23 LAB — I-STAT CHEM 8, ED
BUN: 30 mg/dL — AB (ref 6–20)
CHLORIDE: 109 mmol/L (ref 101–111)
CREATININE: 0.8 mg/dL (ref 0.61–1.24)
Calcium, Ion: 1.17 mmol/L (ref 1.15–1.40)
Glucose, Bld: 157 mg/dL — ABNORMAL HIGH (ref 65–99)
HEMATOCRIT: 25 % — AB (ref 39.0–52.0)
Hemoglobin: 8.5 g/dL — ABNORMAL LOW (ref 13.0–17.0)
POTASSIUM: 3.6 mmol/L (ref 3.5–5.1)
Sodium: 148 mmol/L — ABNORMAL HIGH (ref 135–145)
TCO2: 30 mmol/L (ref 0–100)

## 2016-08-23 MED ORDER — LIDOCAINE-EPINEPHRINE (PF) 2 %-1:200000 IJ SOLN
10.0000 mL | Freq: Once | INTRAMUSCULAR | Status: AC
  Administered 2016-08-23: 10 mL via INTRADERMAL
  Filled 2016-08-23: qty 20

## 2016-08-23 NOTE — Discharge Instructions (Signed)
Leave your dressing in place for the next day. Then remove the dressing. If you continue to bleed please apply direct pressure to the area.

## 2016-08-23 NOTE — Progress Notes (Signed)
Placed on settings given to RT from [facility].
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
## 2016-08-23 NOTE — ED Notes (Signed)
Pt transferred back up to [place]

## 2016-08-24 ENCOUNTER — Inpatient Hospital Stay
Admission: RE | Admit: 2016-08-24 | Discharge: 2016-09-16 | Disposition: A | Discharge status: Skilled Nursing Facility | Payer: No Typology Code available for payment source | Source: Ambulatory Visit | Attending: Specialist | Admitting: Specialist

## 2016-08-24 DIAGNOSIS — J9690 Respiratory failure, unspecified, unspecified whether with hypoxia or hypercapnia: Secondary | ICD-10-CM

## 2016-08-24 DIAGNOSIS — Z8781 Personal history of (healed) traumatic fracture: Secondary | ICD-10-CM

## 2016-08-24 DIAGNOSIS — R4182 Altered mental status, unspecified: Secondary | ICD-10-CM

## 2016-08-24 DIAGNOSIS — J90 Pleural effusion, not elsewhere classified: Secondary | ICD-10-CM

## 2016-08-24 DIAGNOSIS — K7460 Unspecified cirrhosis of liver: Secondary | ICD-10-CM

## 2016-08-24 DIAGNOSIS — Z4659 Encounter for fitting and adjustment of other gastrointestinal appliance and device: Secondary | ICD-10-CM

## 2016-08-24 LAB — BASIC METABOLIC PANEL
ANION GAP: 6 (ref 5–15)
BUN: 25 mg/dL — AB (ref 6–20)
CHLORIDE: 110 mmol/L (ref 101–111)
CO2: 30 mmol/L (ref 22–32)
Calcium: 8.8 mg/dL — ABNORMAL LOW (ref 8.9–10.3)
Creatinine, Ser: 0.75 mg/dL (ref 0.61–1.24)
GFR calc Af Amer: >60 mL/min (ref >60)
GFR calc non Af Amer: >60 mL/min (ref >60)
GLUCOSE: 181 mg/dL — AB (ref 65–99)
Potassium: 3.6 mmol/L (ref 3.5–5.1)
Sodium: 146 mmol/L — ABNORMAL HIGH (ref 135–145)

## 2016-08-24 LAB — CBC WITH DIFFERENTIAL/PLATELET
Basophils Absolute: 0 10*3/uL (ref 0.0–0.1)
Basophils Relative: 0 %
EOS ABS: 0.1 10*3/uL (ref 0.0–0.7)
EOS PCT: 1 %
HCT: 27.8 % — ABNORMAL LOW (ref 39.0–52.0)
Hemoglobin: 8.3 g/dL — ABNORMAL LOW (ref 13.0–17.0)
LYMPHS ABS: 1 10*3/uL (ref 0.7–4.0)
Lymphocytes Relative: 10 %
MCH: 27.3 pg (ref 26.0–34.0)
MCHC: 29.9 g/dL — ABNORMAL LOW (ref 30.0–36.0)
MCV: 91.4 fL (ref 78.0–100.0)
MONOS PCT: 7 %
Monocytes Absolute: 0.7 10*3/uL (ref 0.1–1.0)
Neutro Abs: 8.1 10*3/uL — ABNORMAL HIGH (ref 1.7–7.7)
Neutrophils Relative %: 82 %
Platelets: 437 10*3/uL — ABNORMAL HIGH (ref 150–400)
RBC: 3.04 MIL/uL — AB (ref 4.22–5.81)
RDW: 15 % (ref 11.5–15.5)
WBC: 9.8 10*3/uL (ref 4.0–10.5)

## 2016-08-24 LAB — MAGNESIUM: Magnesium: 2.1 mg/dL (ref 1.7–2.4)

## 2016-08-24 LAB — PHOSPHORUS: Phosphorus: 3.7 mg/dL (ref 2.5–4.6)

## 2016-08-25 ENCOUNTER — Other Ambulatory Visit (HOSPITAL_COMMUNITY): Payer: No Typology Code available for payment source

## 2016-08-25 LAB — CBC
HCT: 25 % — ABNORMAL LOW (ref 39.0–52.0)
Hemoglobin: 7.3 g/dL — ABNORMAL LOW (ref 13.0–17.0)
MCH: 26.5 pg (ref 26.0–34.0)
MCHC: 29.2 g/dL — ABNORMAL LOW (ref 30.0–36.0)
MCV: 90.9 fL (ref 78.0–100.0)
PLATELETS: 399 10*3/uL (ref 150–400)
RBC: 2.75 MIL/uL — AB (ref 4.22–5.81)
RDW: 15 % (ref 11.5–15.5)
WBC: 12.2 10*3/uL — AB (ref 4.0–10.5)

## 2016-08-26 ENCOUNTER — Other Ambulatory Visit (HOSPITAL_COMMUNITY): Payer: No Typology Code available for payment source

## 2016-08-26 LAB — BASIC METABOLIC PANEL
Anion gap: 6 (ref 5–15)
BUN: 33 mg/dL — ABNORMAL HIGH (ref 6–20)
CALCIUM: 8.7 mg/dL — AB (ref 8.9–10.3)
CO2: 31 mmol/L (ref 22–32)
CREATININE: 0.74 mg/dL (ref 0.61–1.24)
Chloride: 111 mmol/L (ref 101–111)
GFR calc Af Amer: >60 mL/min (ref >60)
GLUCOSE: 149 mg/dL — AB (ref 65–99)
Potassium: 3.4 mmol/L — ABNORMAL LOW (ref 3.5–5.1)
Sodium: 148 mmol/L — ABNORMAL HIGH (ref 135–145)

## 2016-08-26 LAB — CBC WITH DIFFERENTIAL/PLATELET
Basophils Absolute: 0 10*3/uL (ref 0.0–0.1)
Basophils Relative: 0 %
EOS ABS: 0.1 10*3/uL (ref 0.0–0.7)
EOS PCT: 1 %
HCT: 25.8 % — ABNORMAL LOW (ref 39.0–52.0)
Hemoglobin: 7.7 g/dL — ABNORMAL LOW (ref 13.0–17.0)
LYMPHS ABS: 1.2 10*3/uL (ref 0.7–4.0)
Lymphocytes Relative: 10 %
MCH: 27.2 pg (ref 26.0–34.0)
MCHC: 29.8 g/dL — AB (ref 30.0–36.0)
MCV: 91.2 fL (ref 78.0–100.0)
MONO ABS: 0.7 10*3/uL (ref 0.1–1.0)
Monocytes Relative: 5 %
Neutro Abs: 10.4 10*3/uL — ABNORMAL HIGH (ref 1.7–7.7)
Neutrophils Relative %: 84 %
PLATELETS: 417 10*3/uL — AB (ref 150–400)
RBC: 2.83 MIL/uL — AB (ref 4.22–5.81)
RDW: 14.9 % (ref 11.5–15.5)
WBC: 12.4 10*3/uL — AB (ref 4.0–10.5)

## 2016-08-26 LAB — PHOSPHORUS: Phosphorus: 3.7 mg/dL (ref 2.5–4.6)

## 2016-08-26 LAB — MAGNESIUM: Magnesium: 2.1 mg/dL (ref 1.7–2.4)

## 2016-08-27 ENCOUNTER — Other Ambulatory Visit (HOSPITAL_COMMUNITY): Payer: No Typology Code available for payment source

## 2016-08-27 LAB — BASIC METABOLIC PANEL
Anion gap: 7 (ref 5–15)
BUN: 28 mg/dL — ABNORMAL HIGH (ref 6–20)
CO2: 31 mmol/L (ref 22–32)
CREATININE: 0.65 mg/dL (ref 0.61–1.24)
Calcium: 8.5 mg/dL — ABNORMAL LOW (ref 8.9–10.3)
Chloride: 105 mmol/L (ref 101–111)
GLUCOSE: 190 mg/dL — AB (ref 65–99)
Potassium: 4.1 mmol/L (ref 3.5–5.1)
Sodium: 143 mmol/L (ref 135–145)

## 2016-08-27 LAB — CBC
HEMATOCRIT: 24.6 % — AB (ref 39.0–52.0)
Hemoglobin: 7.2 g/dL — ABNORMAL LOW (ref 13.0–17.0)
MCH: 26.7 pg (ref 26.0–34.0)
MCHC: 29.3 g/dL — AB (ref 30.0–36.0)
MCV: 91.1 fL (ref 78.0–100.0)
PLATELETS: 408 10*3/uL — AB (ref 150–400)
RBC: 2.7 MIL/uL — ABNORMAL LOW (ref 4.22–5.81)
RDW: 14.8 % (ref 11.5–15.5)
WBC: 10 10*3/uL (ref 4.0–10.5)

## 2016-08-27 MED ORDER — LIDOCAINE VISCOUS 2 % MT SOLN
15.0000 mL | Freq: Once | OROMUCOSAL | Status: AC
  Administered 2016-08-27: 5 mL via OROMUCOSAL

## 2016-08-27 MED ORDER — IOPAMIDOL (ISOVUE-300) INJECTION 61%
50.0000 mL | Freq: Once | INTRAVENOUS | Status: AC | PRN
  Administered 2016-08-27: 20 mL

## 2016-08-30 LAB — CBC WITH DIFFERENTIAL/PLATELET
BASOS PCT: 0 %
Basophils Absolute: 0 10*3/uL (ref 0.0–0.1)
EOS ABS: 0.2 10*3/uL (ref 0.0–0.7)
Eosinophils Relative: 2 %
HCT: 23.4 % — ABNORMAL LOW (ref 39.0–52.0)
Hemoglobin: 7.2 g/dL — ABNORMAL LOW (ref 13.0–17.0)
LYMPHS ABS: 1.4 10*3/uL (ref 0.7–4.0)
Lymphocytes Relative: 17 %
MCH: 27.3 pg (ref 26.0–34.0)
MCHC: 30.8 g/dL (ref 30.0–36.0)
MCV: 88.6 fL (ref 78.0–100.0)
MONOS PCT: 8 %
Monocytes Absolute: 0.7 10*3/uL (ref 0.1–1.0)
NEUTROS PCT: 73 %
Neutro Abs: 6.1 10*3/uL (ref 1.7–7.7)
Platelets: 432 10*3/uL — ABNORMAL HIGH (ref 150–400)
RBC: 2.64 MIL/uL — AB (ref 4.22–5.81)
RDW: 14.6 % (ref 11.5–15.5)
WBC: 8.4 10*3/uL (ref 4.0–10.5)

## 2016-08-30 LAB — MAGNESIUM: MAGNESIUM: 2.2 mg/dL (ref 1.7–2.4)

## 2016-08-30 LAB — BASIC METABOLIC PANEL
Anion gap: 4 — ABNORMAL LOW (ref 5–15)
BUN: 15 mg/dL (ref 6–20)
CALCIUM: 8.4 mg/dL — AB (ref 8.9–10.3)
CO2: 33 mmol/L — AB (ref 22–32)
CREATININE: 0.61 mg/dL (ref 0.61–1.24)
Chloride: 103 mmol/L (ref 101–111)
GFR calc Af Amer: >60 mL/min (ref >60)
GFR calc non Af Amer: >60 mL/min (ref >60)
GLUCOSE: 133 mg/dL — AB (ref 65–99)
Potassium: 4.2 mmol/L (ref 3.5–5.1)
Sodium: 140 mmol/L (ref 135–145)

## 2016-08-30 LAB — PHOSPHORUS: PHOSPHORUS: 3.6 mg/dL (ref 2.5–4.6)

## 2016-09-02 ENCOUNTER — Other Ambulatory Visit (HOSPITAL_COMMUNITY): Payer: No Typology Code available for payment source

## 2016-09-02 LAB — MAGNESIUM: MAGNESIUM: 2.2 mg/dL (ref 1.7–2.4)

## 2016-09-03 ENCOUNTER — Other Ambulatory Visit (HOSPITAL_COMMUNITY): Payer: No Typology Code available for payment source

## 2016-09-03 LAB — CBC WITH DIFFERENTIAL/PLATELET
BASOS ABS: 0 10*3/uL (ref 0.0–0.1)
BASOS PCT: 0 %
Eosinophils Absolute: 0 10*3/uL (ref 0.0–0.7)
Eosinophils Relative: 1 %
HEMATOCRIT: 25.2 % — AB (ref 39.0–52.0)
HEMOGLOBIN: 7.8 g/dL — AB (ref 13.0–17.0)
Lymphocytes Relative: 14 %
Lymphs Abs: 1 10*3/uL (ref 0.7–4.0)
MCH: 27 pg (ref 26.0–34.0)
MCHC: 31 g/dL (ref 30.0–36.0)
MCV: 87.2 fL (ref 78.0–100.0)
Monocytes Absolute: 0.6 10*3/uL (ref 0.1–1.0)
Monocytes Relative: 9 %
NEUTROS ABS: 5.7 10*3/uL (ref 1.7–7.7)
NEUTROS PCT: 76 %
Platelets: 538 10*3/uL — ABNORMAL HIGH (ref 150–400)
RBC: 2.89 MIL/uL — AB (ref 4.22–5.81)
RDW: 14.6 % (ref 11.5–15.5)
WBC: 7.4 10*3/uL (ref 4.0–10.5)

## 2016-09-03 LAB — BASIC METABOLIC PANEL
ANION GAP: 7 (ref 5–15)
BUN: 12 mg/dL (ref 6–20)
CHLORIDE: 100 mmol/L — AB (ref 101–111)
CO2: 30 mmol/L (ref 22–32)
Calcium: 8.5 mg/dL — ABNORMAL LOW (ref 8.9–10.3)
Creatinine, Ser: 0.59 mg/dL — ABNORMAL LOW (ref 0.61–1.24)
GFR calc non Af Amer: >60 mL/min (ref >60)
Glucose, Bld: 118 mg/dL — ABNORMAL HIGH (ref 65–99)
Potassium: 3.8 mmol/L (ref 3.5–5.1)
Sodium: 137 mmol/L (ref 135–145)

## 2016-09-03 LAB — TSH: TSH: 0.782 u[IU]/mL (ref 0.350–4.500)

## 2016-09-03 MED ORDER — LIDOCAINE VISCOUS 2 % MT SOLN: 15.0000 mL | Freq: Once | OROMUCOSAL | Status: DC

## 2016-09-03 MED ORDER — IOPAMIDOL (ISOVUE-300) INJECTION 61%
50.0000 mL | Freq: Once | INTRAVENOUS | Status: AC | PRN
  Administered 2016-09-03: 10 mL

## 2016-09-04 ENCOUNTER — Other Ambulatory Visit (HOSPITAL_COMMUNITY): Payer: No Typology Code available for payment source

## 2016-09-04 LAB — HEMOGLOBIN A1C
HEMOGLOBIN A1C: 5.7 % — AB (ref 4.8–5.6)
MEAN PLASMA GLUCOSE: 117 mg/dL

## 2016-09-06 LAB — CBC WITH DIFFERENTIAL/PLATELET
Basophils Absolute: 0 K/uL (ref 0.0–0.1)
Basophils Relative: 0 %
Eosinophils Absolute: 0.1 K/uL (ref 0.0–0.7)
Eosinophils Relative: 1 %
HCT: 28.5 % — ABNORMAL LOW (ref 39.0–52.0)
Hemoglobin: 8.5 g/dL — ABNORMAL LOW (ref 13.0–17.0)
Lymphocytes Relative: 13 %
Lymphs Abs: 1.2 K/uL (ref 0.7–4.0)
MCH: 26.1 pg (ref 26.0–34.0)
MCHC: 29.8 g/dL — ABNORMAL LOW (ref 30.0–36.0)
MCV: 87.4 fL (ref 78.0–100.0)
Monocytes Absolute: 0.5 K/uL (ref 0.1–1.0)
Monocytes Relative: 6 %
Neutro Abs: 7.5 K/uL (ref 1.7–7.7)
Neutrophils Relative %: 80 %
Platelets: 571 K/uL — ABNORMAL HIGH (ref 150–400)
RBC: 3.26 MIL/uL — ABNORMAL LOW (ref 4.22–5.81)
RDW: 14.5 % (ref 11.5–15.5)
WBC: 9.3 K/uL (ref 4.0–10.5)

## 2016-09-06 LAB — BASIC METABOLIC PANEL
ANION GAP: 9 (ref 5–15)
BUN: 19 mg/dL (ref 6–20)
CALCIUM: 8.7 mg/dL — AB (ref 8.9–10.3)
CO2: 32 mmol/L (ref 22–32)
CREATININE: 0.64 mg/dL (ref 0.61–1.24)
Chloride: 97 mmol/L — ABNORMAL LOW (ref 101–111)
GFR calc non Af Amer: >60 mL/min (ref >60)
Glucose, Bld: 114 mg/dL — ABNORMAL HIGH (ref 65–99)
Potassium: 4.2 mmol/L (ref 3.5–5.1)
SODIUM: 138 mmol/L (ref 135–145)

## 2016-09-06 LAB — PHOSPHORUS: Phosphorus: 4.2 mg/dL (ref 2.5–4.6)

## 2016-09-06 LAB — MAGNESIUM: Magnesium: 2.2 mg/dL (ref 1.7–2.4)

## 2016-09-09 ENCOUNTER — Institutional Professional Consult (permissible substitution) (HOSPITAL_COMMUNITY): Payer: No Typology Code available for payment source

## 2016-09-12 LAB — CBC WITH DIFFERENTIAL/PLATELET
BASOS ABS: 0 10*3/uL (ref 0.0–0.1)
BASOS PCT: 0 %
EOS ABS: 0.1 10*3/uL (ref 0.0–0.7)
EOS PCT: 1 %
HCT: 28.1 % — ABNORMAL LOW (ref 39.0–52.0)
Hemoglobin: 8.4 g/dL — ABNORMAL LOW (ref 13.0–17.0)
Lymphocytes Relative: 16 %
Lymphs Abs: 1.2 10*3/uL (ref 0.7–4.0)
MCH: 26.2 pg (ref 26.0–34.0)
MCHC: 29.9 g/dL — ABNORMAL LOW (ref 30.0–36.0)
MCV: 87.5 fL (ref 78.0–100.0)
MONO ABS: 0.6 10*3/uL (ref 0.1–1.0)
Monocytes Relative: 7 %
Neutro Abs: 5.7 10*3/uL (ref 1.7–7.7)
Neutrophils Relative %: 76 %
PLATELETS: 533 10*3/uL — AB (ref 150–400)
RBC: 3.21 MIL/uL — ABNORMAL LOW (ref 4.22–5.81)
RDW: 14.6 % (ref 11.5–15.5)
WBC: 7.5 10*3/uL (ref 4.0–10.5)

## 2016-09-12 LAB — MAGNESIUM: Magnesium: 1.7 mg/dL (ref 1.7–2.4)

## 2016-09-12 LAB — BASIC METABOLIC PANEL
ANION GAP: 7 (ref 5–15)
BUN: 12 mg/dL (ref 6–20)
CALCIUM: 8.8 mg/dL — AB (ref 8.9–10.3)
CO2: 29 mmol/L (ref 22–32)
Chloride: 102 mmol/L (ref 101–111)
Creatinine, Ser: 0.67 mg/dL (ref 0.61–1.24)
GLUCOSE: 68 mg/dL (ref 65–99)
Potassium: 3.5 mmol/L (ref 3.5–5.1)
Sodium: 138 mmol/L (ref 135–145)

## 2016-09-12 LAB — PHOSPHORUS: Phosphorus: 5 mg/dL — ABNORMAL HIGH (ref 2.5–4.6)

## 2016-09-15 ENCOUNTER — Other Ambulatory Visit (HOSPITAL_COMMUNITY): Payer: No Typology Code available for payment source

## 2016-09-15 ENCOUNTER — Inpatient Hospital Stay (HOSPITAL_BASED_OUTPATIENT_CLINIC_OR_DEPARTMENT_OTHER)
Admission: RE | Admit: 2016-09-15 | Discharge: 2016-09-15 | Disposition: A | Discharge status: Home / Self Care | Payer: No Typology Code available for payment source | Source: Ambulatory Visit | Attending: Internal Medicine | Admitting: Internal Medicine

## 2016-09-15 DIAGNOSIS — R569 Unspecified convulsions: Secondary | ICD-10-CM

## 2016-09-15 LAB — URINALYSIS, ROUTINE W REFLEX MICROSCOPIC
Bilirubin Urine: NEGATIVE
Glucose, UA: NEGATIVE mg/dL
Hgb urine dipstick: NEGATIVE
KETONES UR: NEGATIVE mg/dL
LEUKOCYTES UA: NEGATIVE
NITRITE: NEGATIVE
PH: 5 (ref 5.0–8.0)
PROTEIN: NEGATIVE mg/dL
Specific Gravity, Urine: 1.012 (ref 1.005–1.030)

## 2016-09-15 LAB — BASIC METABOLIC PANEL
ANION GAP: 8 (ref 5–15)
BUN: 12 mg/dL (ref 6–20)
CO2: 31 mmol/L (ref 22–32)
Calcium: 8.9 mg/dL (ref 8.9–10.3)
Chloride: 100 mmol/L — ABNORMAL LOW (ref 101–111)
Creatinine, Ser: 0.73 mg/dL (ref 0.61–1.24)
GFR calc Af Amer: >60 mL/min (ref >60)
GLUCOSE: 95 mg/dL (ref 65–99)
POTASSIUM: 3.5 mmol/L (ref 3.5–5.1)
Sodium: 139 mmol/L (ref 135–145)

## 2016-09-15 LAB — CBC
HCT: 30.7 % — ABNORMAL LOW (ref 39.0–52.0)
HEMOGLOBIN: 9.1 g/dL — AB (ref 13.0–17.0)
MCH: 25.9 pg — ABNORMAL LOW (ref 26.0–34.0)
MCHC: 29.6 g/dL — ABNORMAL LOW (ref 30.0–36.0)
MCV: 87.2 fL (ref 78.0–100.0)
PLATELETS: 431 10*3/uL — AB (ref 150–400)
RBC: 3.52 MIL/uL — AB (ref 4.22–5.81)
RDW: 14.6 % (ref 11.5–15.5)
WBC: 6.6 10*3/uL (ref 4.0–10.5)

## 2016-09-15 LAB — AMMONIA: AMMONIA: 13 umol/L (ref 9–35)

## 2016-09-15 NOTE — Progress Notes (Signed)
EEG completed, results pending. 

## 2016-09-15 NOTE — Procedures (Signed)
ELECTROENCEPHALOGRAM REPORT  Date of Study: 09/15/2016  Patient's Name: Ronald [name] MRN: [id number] Date of Birth: [birth year]  Referring Provider: Dr. Dwaine Deter  Clinical History: This is a 71 year old man with transient right arm stiffness.  Medications: No medications listed   Technical Summary: A multichannel digital EEG recording measured by the international 10-20 system with electrodes applied with paste and impedances below 5000 ohms performed in our laboratory with EKG monitoring in an awake and drowsy patient.  Hyperventilation and photic stimulation were not performed.  The digital EEG was referentially recorded, reformatted, and digitally filtered in a variety of bipolar and referential montages for optimal display.    Description: The patient is awake and drowsy during the recording.  During maximal wakefulness, there is a symmetric, medium voltage 9 Hz posterior dominant rhythm that attenuates with eye opening.  The record is symmetric.  During drowsiness, there is an increase in theta slowing of the background. Deeper stages of sleep were not seen. Hyperventilation and photic stimulation were not peformed.  There were no epileptiform discharges or electrographic seizures seen.    EKG lead showed sinus tachycardia.  Impression: This awake and drowsy EEG is normal.    Clinical Correlation: A normal EEG does not exclude a clinical diagnosis of epilepsy. Clinical correlation is advised.   Ellouise Newer, M.D.

## 2016-09-16 DIAGNOSIS — E119 Type 2 diabetes mellitus without complications: Secondary | ICD-10-CM | POA: Diagnosis not present

## 2016-09-16 DIAGNOSIS — S32059D Unspecified fracture of fifth lumbar vertebra, subsequent encounter for fracture with routine healing: Secondary | ICD-10-CM | POA: Diagnosis not present

## 2016-09-16 DIAGNOSIS — S2243XA Multiple fractures of ribs, bilateral, initial encounter for closed fracture: Secondary | ICD-10-CM | POA: Diagnosis not present

## 2016-09-16 DIAGNOSIS — R0603 Acute respiratory distress: Secondary | ICD-10-CM | POA: Diagnosis not present

## 2016-09-16 DIAGNOSIS — S06890A Other specified intracranial injury without loss of consciousness, initial encounter: Secondary | ICD-10-CM | POA: Diagnosis not present

## 2016-09-16 DIAGNOSIS — I10 Essential (primary) hypertension: Secondary | ICD-10-CM | POA: Diagnosis not present

## 2016-09-16 DIAGNOSIS — S2249XD Multiple fractures of ribs, unspecified side, subsequent encounter for fracture with routine healing: Secondary | ICD-10-CM | POA: Diagnosis not present

## 2016-09-16 DIAGNOSIS — E1165 Type 2 diabetes mellitus with hyperglycemia: Secondary | ICD-10-CM | POA: Diagnosis not present

## 2016-09-16 DIAGNOSIS — G464 Cerebellar stroke syndrome: Secondary | ICD-10-CM | POA: Diagnosis not present

## 2016-09-16 DIAGNOSIS — Z430 Encounter for attention to tracheostomy: Secondary | ICD-10-CM | POA: Diagnosis not present

## 2016-09-16 DIAGNOSIS — Z7982 Long term (current) use of aspirin: Secondary | ICD-10-CM | POA: Diagnosis not present

## 2016-09-16 DIAGNOSIS — R278 Other lack of coordination: Secondary | ICD-10-CM | POA: Diagnosis not present

## 2016-09-16 DIAGNOSIS — Z8249 Family history of ischemic heart disease and other diseases of the circulatory system: Secondary | ICD-10-CM | POA: Diagnosis not present

## 2016-09-16 DIAGNOSIS — S12591D Other nondisplaced fracture of sixth cervical vertebra, subsequent encounter for fracture with routine healing: Secondary | ICD-10-CM | POA: Diagnosis not present

## 2016-09-16 DIAGNOSIS — Z9119 Patient's noncompliance with other medical treatment and regimen: Secondary | ICD-10-CM | POA: Diagnosis not present

## 2016-09-16 DIAGNOSIS — Z7951 Long term (current) use of inhaled steroids: Secondary | ICD-10-CM | POA: Diagnosis not present

## 2016-09-16 DIAGNOSIS — K219 Gastro-esophageal reflux disease without esophagitis: Secondary | ICD-10-CM | POA: Diagnosis not present

## 2016-09-16 DIAGNOSIS — J441 Chronic obstructive pulmonary disease with (acute) exacerbation: Secondary | ICD-10-CM | POA: Diagnosis not present

## 2016-09-16 DIAGNOSIS — R1312 Dysphagia, oropharyngeal phase: Secondary | ICD-10-CM | POA: Diagnosis not present

## 2016-09-16 DIAGNOSIS — J984 Other disorders of lung: Secondary | ICD-10-CM | POA: Diagnosis not present

## 2016-09-16 DIAGNOSIS — S22010D Wedge compression fracture of first thoracic vertebra, subsequent encounter for fracture with routine healing: Secondary | ICD-10-CM | POA: Diagnosis not present

## 2016-09-16 DIAGNOSIS — Z8673 Personal history of transient ischemic attack (TIA), and cerebral infarction without residual deficits: Secondary | ICD-10-CM | POA: Diagnosis not present

## 2016-09-16 DIAGNOSIS — Z87891 Personal history of nicotine dependence: Secondary | ICD-10-CM | POA: Diagnosis not present

## 2016-09-16 DIAGNOSIS — G459 Transient cerebral ischemic attack, unspecified: Secondary | ICD-10-CM | POA: Diagnosis not present

## 2016-09-16 DIAGNOSIS — J449 Chronic obstructive pulmonary disease, unspecified: Secondary | ICD-10-CM | POA: Diagnosis not present

## 2016-09-16 DIAGNOSIS — R061 Stridor: Secondary | ICD-10-CM | POA: Diagnosis not present

## 2016-09-16 DIAGNOSIS — S12100A Unspecified displaced fracture of second cervical vertebra, initial encounter for closed fracture: Secondary | ICD-10-CM | POA: Diagnosis not present

## 2016-09-16 DIAGNOSIS — J9602 Acute respiratory failure with hypercapnia: Secondary | ICD-10-CM | POA: Diagnosis not present

## 2016-09-16 DIAGNOSIS — S12600D Unspecified displaced fracture of seventh cervical vertebra, subsequent encounter for fracture with routine healing: Secondary | ICD-10-CM | POA: Diagnosis not present

## 2016-09-16 DIAGNOSIS — I482 Chronic atrial fibrillation: Secondary | ICD-10-CM | POA: Diagnosis not present

## 2016-09-16 DIAGNOSIS — S2220XD Unspecified fracture of sternum, subsequent encounter for fracture with routine healing: Secondary | ICD-10-CM | POA: Diagnosis not present

## 2016-09-16 DIAGNOSIS — Z930 Tracheostomy status: Secondary | ICD-10-CM | POA: Diagnosis not present

## 2016-09-16 DIAGNOSIS — R2689 Other abnormalities of gait and mobility: Secondary | ICD-10-CM | POA: Diagnosis not present

## 2016-09-16 DIAGNOSIS — I119 Hypertensive heart disease without heart failure: Secondary | ICD-10-CM | POA: Diagnosis not present

## 2016-09-16 DIAGNOSIS — M6281 Muscle weakness (generalized): Secondary | ICD-10-CM | POA: Diagnosis not present

## 2016-09-16 DIAGNOSIS — S12691D Other nondisplaced fracture of seventh cervical vertebra, subsequent encounter for fracture with routine healing: Secondary | ICD-10-CM | POA: Diagnosis not present

## 2016-09-16 DIAGNOSIS — S129XXD Fracture of neck, unspecified, subsequent encounter: Secondary | ICD-10-CM | POA: Diagnosis not present

## 2016-09-16 DIAGNOSIS — Z7984 Long term (current) use of oral hypoglycemic drugs: Secondary | ICD-10-CM | POA: Diagnosis not present

## 2016-09-16 DIAGNOSIS — R531 Weakness: Secondary | ICD-10-CM | POA: Diagnosis not present

## 2016-09-16 DIAGNOSIS — M5136 Other intervertebral disc degeneration, lumbar region: Secondary | ICD-10-CM | POA: Diagnosis not present

## 2016-09-16 DIAGNOSIS — R0902 Hypoxemia: Secondary | ICD-10-CM | POA: Diagnosis not present

## 2016-09-16 DIAGNOSIS — J690 Pneumonitis due to inhalation of food and vomit: Secondary | ICD-10-CM | POA: Diagnosis not present

## 2016-09-16 DIAGNOSIS — K5190 Ulcerative colitis, unspecified, without complications: Secondary | ICD-10-CM | POA: Diagnosis not present

## 2016-09-16 DIAGNOSIS — S12090A Other displaced fracture of first cervical vertebra, initial encounter for closed fracture: Secondary | ICD-10-CM | POA: Diagnosis not present

## 2016-09-16 DIAGNOSIS — R0602 Shortness of breath: Secondary | ICD-10-CM | POA: Diagnosis not present

## 2016-09-16 DIAGNOSIS — R1319 Other dysphagia: Secondary | ICD-10-CM | POA: Diagnosis not present

## 2016-09-16 DIAGNOSIS — J9620 Acute and chronic respiratory failure, unspecified whether with hypoxia or hypercapnia: Secondary | ICD-10-CM | POA: Diagnosis not present

## 2016-09-16 DIAGNOSIS — I252 Old myocardial infarction: Secondary | ICD-10-CM | POA: Diagnosis not present

## 2016-09-16 DIAGNOSIS — Z7901 Long term (current) use of anticoagulants: Secondary | ICD-10-CM | POA: Diagnosis not present

## 2016-09-16 DIAGNOSIS — S22019D Unspecified fracture of first thoracic vertebra, subsequent encounter for fracture with routine healing: Secondary | ICD-10-CM | POA: Diagnosis not present

## 2016-09-16 DIAGNOSIS — S32018D Other fracture of first lumbar vertebra, subsequent encounter for fracture with routine healing: Secondary | ICD-10-CM | POA: Diagnosis not present

## 2016-09-16 DIAGNOSIS — E785 Hyperlipidemia, unspecified: Secondary | ICD-10-CM | POA: Diagnosis not present

## 2016-09-16 DIAGNOSIS — S22039D Unspecified fracture of third thoracic vertebra, subsequent encounter for fracture with routine healing: Secondary | ICD-10-CM | POA: Diagnosis not present

## 2016-09-16 DIAGNOSIS — Z79899 Other long term (current) drug therapy: Secondary | ICD-10-CM | POA: Diagnosis not present

## 2016-09-16 DIAGNOSIS — J9601 Acute respiratory failure with hypoxia: Secondary | ICD-10-CM | POA: Diagnosis not present

## 2016-09-16 DIAGNOSIS — Z794 Long term (current) use of insulin: Secondary | ICD-10-CM | POA: Diagnosis not present

## 2016-09-16 DIAGNOSIS — S12500D Unspecified displaced fracture of sixth cervical vertebra, subsequent encounter for fracture with routine healing: Secondary | ICD-10-CM | POA: Diagnosis not present

## 2016-09-16 DIAGNOSIS — D62 Acute posthemorrhagic anemia: Secondary | ICD-10-CM | POA: Diagnosis not present

## 2016-09-16 DIAGNOSIS — R404 Transient alteration of awareness: Secondary | ICD-10-CM | POA: Diagnosis not present

## 2016-09-16 DIAGNOSIS — S12500A Unspecified displaced fracture of sixth cervical vertebra, initial encounter for closed fracture: Secondary | ICD-10-CM | POA: Diagnosis not present

## 2016-09-16 LAB — URINE CULTURE: Culture: <10000 — AB

## 2016-09-18 DIAGNOSIS — Z7984 Long term (current) use of oral hypoglycemic drugs: Secondary | ICD-10-CM | POA: Diagnosis not present

## 2016-09-18 DIAGNOSIS — S2220XD Unspecified fracture of sternum, subsequent encounter for fracture with routine healing: Secondary | ICD-10-CM | POA: Diagnosis not present

## 2016-09-18 DIAGNOSIS — R531 Weakness: Secondary | ICD-10-CM | POA: Diagnosis not present

## 2016-09-18 DIAGNOSIS — R0602 Shortness of breath: Secondary | ICD-10-CM | POA: Diagnosis not present

## 2016-09-18 DIAGNOSIS — S32009D Unspecified fracture of unspecified lumbar vertebra, subsequent encounter for fracture with routine healing: Secondary | ICD-10-CM | POA: Insufficient documentation

## 2016-09-18 DIAGNOSIS — R2689 Other abnormalities of gait and mobility: Secondary | ICD-10-CM | POA: Diagnosis not present

## 2016-09-18 DIAGNOSIS — R0902 Hypoxemia: Secondary | ICD-10-CM | POA: Diagnosis not present

## 2016-09-18 DIAGNOSIS — S12501D Unspecified nondisplaced fracture of sixth cervical vertebra, subsequent encounter for fracture with routine healing: Secondary | ICD-10-CM | POA: Insufficient documentation

## 2016-09-18 DIAGNOSIS — Z9119 Patient's noncompliance with other medical treatment and regimen: Secondary | ICD-10-CM | POA: Diagnosis not present

## 2016-09-18 DIAGNOSIS — J9601 Acute respiratory failure with hypoxia: Secondary | ICD-10-CM | POA: Diagnosis not present

## 2016-09-18 DIAGNOSIS — I482 Chronic atrial fibrillation: Secondary | ICD-10-CM | POA: Diagnosis not present

## 2016-09-18 DIAGNOSIS — S12500D Unspecified displaced fracture of sixth cervical vertebra, subsequent encounter for fracture with routine healing: Secondary | ICD-10-CM | POA: Diagnosis not present

## 2016-09-18 DIAGNOSIS — Z79899 Other long term (current) drug therapy: Secondary | ICD-10-CM | POA: Diagnosis not present

## 2016-09-18 DIAGNOSIS — Z743 Need for continuous supervision: Secondary | ICD-10-CM | POA: Diagnosis not present

## 2016-09-18 DIAGNOSIS — S2249XD Multiple fractures of ribs, unspecified side, subsequent encounter for fracture with routine healing: Secondary | ICD-10-CM | POA: Diagnosis not present

## 2016-09-18 DIAGNOSIS — E119 Type 2 diabetes mellitus without complications: Secondary | ICD-10-CM | POA: Diagnosis not present

## 2016-09-18 DIAGNOSIS — J9620 Acute and chronic respiratory failure, unspecified whether with hypoxia or hypercapnia: Secondary | ICD-10-CM | POA: Diagnosis not present

## 2016-09-18 DIAGNOSIS — K219 Gastro-esophageal reflux disease without esophagitis: Secondary | ICD-10-CM | POA: Diagnosis not present

## 2016-09-18 DIAGNOSIS — J953 Chronic pulmonary insufficiency following surgery: Secondary | ICD-10-CM | POA: Diagnosis not present

## 2016-09-18 DIAGNOSIS — J9610 Chronic respiratory failure, unspecified whether with hypoxia or hypercapnia: Secondary | ICD-10-CM | POA: Diagnosis not present

## 2016-09-18 DIAGNOSIS — R633 Feeding difficulties: Secondary | ICD-10-CM | POA: Diagnosis not present

## 2016-09-18 DIAGNOSIS — Z7982 Long term (current) use of aspirin: Secondary | ICD-10-CM | POA: Diagnosis not present

## 2016-09-18 DIAGNOSIS — R404 Transient alteration of awareness: Secondary | ICD-10-CM | POA: Diagnosis not present

## 2016-09-18 DIAGNOSIS — J9690 Respiratory failure, unspecified, unspecified whether with hypoxia or hypercapnia: Secondary | ICD-10-CM | POA: Diagnosis not present

## 2016-09-18 DIAGNOSIS — Z87891 Personal history of nicotine dependence: Secondary | ICD-10-CM | POA: Diagnosis not present

## 2016-09-18 DIAGNOSIS — J9600 Acute respiratory failure, unspecified whether with hypoxia or hypercapnia: Secondary | ICD-10-CM | POA: Diagnosis not present

## 2016-09-18 DIAGNOSIS — Z7901 Long term (current) use of anticoagulants: Secondary | ICD-10-CM | POA: Diagnosis not present

## 2016-09-18 DIAGNOSIS — S12691D Other nondisplaced fracture of seventh cervical vertebra, subsequent encounter for fracture with routine healing: Secondary | ICD-10-CM | POA: Diagnosis not present

## 2016-09-18 DIAGNOSIS — S32018D Other fracture of first lumbar vertebra, subsequent encounter for fracture with routine healing: Secondary | ICD-10-CM | POA: Diagnosis not present

## 2016-09-18 DIAGNOSIS — Z431 Encounter for attention to gastrostomy: Secondary | ICD-10-CM | POA: Diagnosis not present

## 2016-09-18 DIAGNOSIS — S129XXD Fracture of neck, unspecified, subsequent encounter: Secondary | ICD-10-CM | POA: Diagnosis not present

## 2016-09-18 DIAGNOSIS — R278 Other lack of coordination: Secondary | ICD-10-CM | POA: Diagnosis not present

## 2016-09-18 DIAGNOSIS — S22039D Unspecified fracture of third thoracic vertebra, subsequent encounter for fracture with routine healing: Secondary | ICD-10-CM | POA: Diagnosis not present

## 2016-09-18 DIAGNOSIS — R918 Other nonspecific abnormal finding of lung field: Secondary | ICD-10-CM | POA: Diagnosis not present

## 2016-09-18 DIAGNOSIS — J441 Chronic obstructive pulmonary disease with (acute) exacerbation: Secondary | ICD-10-CM | POA: Diagnosis not present

## 2016-09-18 DIAGNOSIS — J90 Pleural effusion, not elsewhere classified: Secondary | ICD-10-CM | POA: Diagnosis not present

## 2016-09-18 DIAGNOSIS — I119 Hypertensive heart disease without heart failure: Secondary | ICD-10-CM | POA: Diagnosis not present

## 2016-09-18 DIAGNOSIS — I252 Old myocardial infarction: Secondary | ICD-10-CM | POA: Diagnosis not present

## 2016-09-18 DIAGNOSIS — J9602 Acute respiratory failure with hypercapnia: Secondary | ICD-10-CM | POA: Diagnosis not present

## 2016-09-18 DIAGNOSIS — R079 Chest pain, unspecified: Secondary | ICD-10-CM | POA: Diagnosis not present

## 2016-09-18 DIAGNOSIS — S32059D Unspecified fracture of fifth lumbar vertebra, subsequent encounter for fracture with routine healing: Secondary | ICD-10-CM | POA: Diagnosis not present

## 2016-09-18 DIAGNOSIS — D62 Acute posthemorrhagic anemia: Secondary | ICD-10-CM | POA: Diagnosis not present

## 2016-09-18 DIAGNOSIS — G459 Transient cerebral ischemic attack, unspecified: Secondary | ICD-10-CM | POA: Diagnosis not present

## 2016-09-18 DIAGNOSIS — M5136 Other intervertebral disc degeneration, lumbar region: Secondary | ICD-10-CM | POA: Diagnosis not present

## 2016-09-18 DIAGNOSIS — Z8673 Personal history of transient ischemic attack (TIA), and cerebral infarction without residual deficits: Secondary | ICD-10-CM | POA: Diagnosis not present

## 2016-09-18 DIAGNOSIS — I517 Cardiomegaly: Secondary | ICD-10-CM | POA: Diagnosis not present

## 2016-09-18 DIAGNOSIS — Z8249 Family history of ischemic heart disease and other diseases of the circulatory system: Secondary | ICD-10-CM | POA: Diagnosis not present

## 2016-09-18 DIAGNOSIS — S22010D Wedge compression fracture of first thoracic vertebra, subsequent encounter for fracture with routine healing: Secondary | ICD-10-CM | POA: Diagnosis not present

## 2016-09-18 DIAGNOSIS — Z794 Long term (current) use of insulin: Secondary | ICD-10-CM | POA: Diagnosis not present

## 2016-09-18 DIAGNOSIS — I493 Ventricular premature depolarization: Secondary | ICD-10-CM | POA: Diagnosis not present

## 2016-09-18 DIAGNOSIS — E1165 Type 2 diabetes mellitus with hyperglycemia: Secondary | ICD-10-CM | POA: Diagnosis not present

## 2016-09-18 DIAGNOSIS — R0603 Acute respiratory distress: Secondary | ICD-10-CM | POA: Diagnosis not present

## 2016-09-18 DIAGNOSIS — J449 Chronic obstructive pulmonary disease, unspecified: Secondary | ICD-10-CM | POA: Diagnosis not present

## 2016-09-18 DIAGNOSIS — J984 Other disorders of lung: Secondary | ICD-10-CM | POA: Diagnosis not present

## 2016-09-18 DIAGNOSIS — Z930 Tracheostomy status: Secondary | ICD-10-CM | POA: Diagnosis not present

## 2016-09-18 DIAGNOSIS — R061 Stridor: Secondary | ICD-10-CM | POA: Diagnosis not present

## 2016-09-18 DIAGNOSIS — J9621 Acute and chronic respiratory failure with hypoxia: Secondary | ICD-10-CM | POA: Diagnosis not present

## 2016-09-18 DIAGNOSIS — I10 Essential (primary) hypertension: Secondary | ICD-10-CM | POA: Diagnosis not present

## 2016-09-18 DIAGNOSIS — Z7951 Long term (current) use of inhaled steroids: Secondary | ICD-10-CM | POA: Diagnosis not present

## 2016-09-18 DIAGNOSIS — K5190 Ulcerative colitis, unspecified, without complications: Secondary | ICD-10-CM | POA: Diagnosis not present

## 2016-09-18 DIAGNOSIS — R9431 Abnormal electrocardiogram [ECG] [EKG]: Secondary | ICD-10-CM | POA: Diagnosis not present

## 2016-09-18 DIAGNOSIS — R1310 Dysphagia, unspecified: Secondary | ICD-10-CM | POA: Diagnosis not present

## 2016-09-18 DIAGNOSIS — S12591D Other nondisplaced fracture of sixth cervical vertebra, subsequent encounter for fracture with routine healing: Secondary | ICD-10-CM | POA: Diagnosis not present

## 2016-09-18 DIAGNOSIS — R1312 Dysphagia, oropharyngeal phase: Secondary | ICD-10-CM | POA: Diagnosis not present

## 2016-09-18 DIAGNOSIS — Z430 Encounter for attention to tracheostomy: Secondary | ICD-10-CM | POA: Diagnosis not present

## 2016-09-18 DIAGNOSIS — S12500A Unspecified displaced fracture of sixth cervical vertebra, initial encounter for closed fracture: Secondary | ICD-10-CM | POA: Diagnosis not present

## 2016-09-18 DIAGNOSIS — S22019D Unspecified fracture of first thoracic vertebra, subsequent encounter for fracture with routine healing: Secondary | ICD-10-CM | POA: Diagnosis not present

## 2016-09-18 DIAGNOSIS — S12600D Unspecified displaced fracture of seventh cervical vertebra, subsequent encounter for fracture with routine healing: Secondary | ICD-10-CM | POA: Diagnosis not present

## 2016-09-18 DIAGNOSIS — J690 Pneumonitis due to inhalation of food and vomit: Secondary | ICD-10-CM | POA: Diagnosis not present

## 2016-09-18 DIAGNOSIS — M6281 Muscle weakness (generalized): Secondary | ICD-10-CM | POA: Diagnosis not present

## 2016-09-18 DIAGNOSIS — E785 Hyperlipidemia, unspecified: Secondary | ICD-10-CM | POA: Diagnosis not present

## 2016-09-18 DIAGNOSIS — R1319 Other dysphagia: Secondary | ICD-10-CM | POA: Diagnosis not present

## 2016-09-20 DIAGNOSIS — Z794 Long term (current) use of insulin: Secondary | ICD-10-CM | POA: Diagnosis not present

## 2016-09-20 DIAGNOSIS — J9601 Acute respiratory failure with hypoxia: Secondary | ICD-10-CM | POA: Diagnosis not present

## 2016-09-20 DIAGNOSIS — J449 Chronic obstructive pulmonary disease, unspecified: Secondary | ICD-10-CM | POA: Diagnosis not present

## 2016-09-20 DIAGNOSIS — Z79899 Other long term (current) drug therapy: Secondary | ICD-10-CM | POA: Diagnosis not present

## 2016-09-20 DIAGNOSIS — J9602 Acute respiratory failure with hypercapnia: Secondary | ICD-10-CM | POA: Diagnosis not present

## 2016-09-20 DIAGNOSIS — R0902 Hypoxemia: Secondary | ICD-10-CM | POA: Diagnosis not present

## 2016-09-20 DIAGNOSIS — Z7982 Long term (current) use of aspirin: Secondary | ICD-10-CM | POA: Diagnosis not present

## 2016-09-20 DIAGNOSIS — Z930 Tracheostomy status: Secondary | ICD-10-CM | POA: Diagnosis not present

## 2016-09-20 DIAGNOSIS — R0603 Acute respiratory distress: Secondary | ICD-10-CM | POA: Diagnosis not present

## 2016-09-20 DIAGNOSIS — K219 Gastro-esophageal reflux disease without esophagitis: Secondary | ICD-10-CM | POA: Diagnosis not present

## 2016-09-20 DIAGNOSIS — I10 Essential (primary) hypertension: Secondary | ICD-10-CM | POA: Diagnosis not present

## 2016-09-20 DIAGNOSIS — S12500A Unspecified displaced fracture of sixth cervical vertebra, initial encounter for closed fracture: Secondary | ICD-10-CM | POA: Diagnosis not present

## 2016-09-20 DIAGNOSIS — Z8673 Personal history of transient ischemic attack (TIA), and cerebral infarction without residual deficits: Secondary | ICD-10-CM | POA: Diagnosis not present

## 2016-09-20 DIAGNOSIS — Z87891 Personal history of nicotine dependence: Secondary | ICD-10-CM | POA: Diagnosis not present

## 2016-09-20 DIAGNOSIS — J690 Pneumonitis due to inhalation of food and vomit: Secondary | ICD-10-CM | POA: Diagnosis not present

## 2016-09-21 DIAGNOSIS — Z87891 Personal history of nicotine dependence: Secondary | ICD-10-CM | POA: Diagnosis not present

## 2016-09-21 DIAGNOSIS — J9602 Acute respiratory failure with hypercapnia: Secondary | ICD-10-CM | POA: Diagnosis not present

## 2016-09-21 DIAGNOSIS — R0603 Acute respiratory distress: Secondary | ICD-10-CM | POA: Diagnosis not present

## 2016-09-25 ENCOUNTER — Ambulatory Visit (HOSPITAL_COMMUNITY): Payer: Medicare Other

## 2016-10-03 DIAGNOSIS — Z743 Need for continuous supervision: Secondary | ICD-10-CM | POA: Diagnosis not present

## 2016-10-03 DIAGNOSIS — J9600 Acute respiratory failure, unspecified whether with hypoxia or hypercapnia: Secondary | ICD-10-CM | POA: Diagnosis not present

## 2016-10-03 DIAGNOSIS — L409 Psoriasis, unspecified: Secondary | ICD-10-CM | POA: Diagnosis not present

## 2016-10-03 DIAGNOSIS — R2689 Other abnormalities of gait and mobility: Secondary | ICD-10-CM | POA: Diagnosis not present

## 2016-10-03 DIAGNOSIS — R1312 Dysphagia, oropharyngeal phase: Secondary | ICD-10-CM | POA: Diagnosis not present

## 2016-10-03 DIAGNOSIS — R0902 Hypoxemia: Secondary | ICD-10-CM | POA: Diagnosis not present

## 2016-10-03 DIAGNOSIS — K5190 Ulcerative colitis, unspecified, without complications: Secondary | ICD-10-CM | POA: Diagnosis not present

## 2016-10-03 DIAGNOSIS — J9620 Acute and chronic respiratory failure, unspecified whether with hypoxia or hypercapnia: Secondary | ICD-10-CM | POA: Diagnosis not present

## 2016-10-03 DIAGNOSIS — E119 Type 2 diabetes mellitus without complications: Secondary | ICD-10-CM | POA: Diagnosis not present

## 2016-10-03 DIAGNOSIS — R278 Other lack of coordination: Secondary | ICD-10-CM | POA: Diagnosis not present

## 2016-10-03 DIAGNOSIS — M1990 Unspecified osteoarthritis, unspecified site: Secondary | ICD-10-CM | POA: Diagnosis not present

## 2016-10-03 DIAGNOSIS — J441 Chronic obstructive pulmonary disease with (acute) exacerbation: Secondary | ICD-10-CM | POA: Diagnosis not present

## 2016-10-03 DIAGNOSIS — J690 Pneumonitis due to inhalation of food and vomit: Secondary | ICD-10-CM | POA: Diagnosis not present

## 2016-10-03 DIAGNOSIS — G459 Transient cerebral ischemic attack, unspecified: Secondary | ICD-10-CM | POA: Diagnosis not present

## 2016-10-03 DIAGNOSIS — J15211 Pneumonia due to Methicillin susceptible Staphylococcus aureus: Secondary | ICD-10-CM | POA: Diagnosis not present

## 2016-10-03 DIAGNOSIS — J440 Chronic obstructive pulmonary disease with acute lower respiratory infection: Secondary | ICD-10-CM | POA: Diagnosis not present

## 2016-10-03 DIAGNOSIS — Z7982 Long term (current) use of aspirin: Secondary | ICD-10-CM | POA: Diagnosis not present

## 2016-10-03 DIAGNOSIS — S2220XD Unspecified fracture of sternum, subsequent encounter for fracture with routine healing: Secondary | ICD-10-CM | POA: Diagnosis not present

## 2016-10-03 DIAGNOSIS — I10 Essential (primary) hypertension: Secondary | ICD-10-CM | POA: Diagnosis not present

## 2016-10-03 DIAGNOSIS — M6281 Muscle weakness (generalized): Secondary | ICD-10-CM | POA: Diagnosis not present

## 2016-10-03 DIAGNOSIS — Z87828 Personal history of other (healed) physical injury and trauma: Secondary | ICD-10-CM | POA: Diagnosis not present

## 2016-10-03 DIAGNOSIS — J9503 Malfunction of tracheostomy stoma: Secondary | ICD-10-CM | POA: Diagnosis not present

## 2016-10-03 DIAGNOSIS — R29810 Facial weakness: Secondary | ICD-10-CM | POA: Diagnosis not present

## 2016-10-03 DIAGNOSIS — R061 Stridor: Secondary | ICD-10-CM | POA: Diagnosis not present

## 2016-10-03 DIAGNOSIS — J449 Chronic obstructive pulmonary disease, unspecified: Secondary | ICD-10-CM | POA: Diagnosis not present

## 2016-10-03 DIAGNOSIS — S2249XD Multiple fractures of ribs, unspecified side, subsequent encounter for fracture with routine healing: Secondary | ICD-10-CM | POA: Diagnosis not present

## 2016-10-03 DIAGNOSIS — Z955 Presence of coronary angioplasty implant and graft: Secondary | ICD-10-CM | POA: Diagnosis not present

## 2016-10-03 DIAGNOSIS — Z825 Family history of asthma and other chronic lower respiratory diseases: Secondary | ICD-10-CM | POA: Diagnosis not present

## 2016-10-03 DIAGNOSIS — Z8249 Family history of ischemic heart disease and other diseases of the circulatory system: Secondary | ICD-10-CM | POA: Diagnosis not present

## 2016-10-03 DIAGNOSIS — J9621 Acute and chronic respiratory failure with hypoxia: Secondary | ICD-10-CM | POA: Diagnosis not present

## 2016-10-03 DIAGNOSIS — J181 Lobar pneumonia, unspecified organism: Secondary | ICD-10-CM | POA: Diagnosis not present

## 2016-10-03 DIAGNOSIS — I2510 Atherosclerotic heart disease of native coronary artery without angina pectoris: Secondary | ICD-10-CM | POA: Diagnosis not present

## 2016-10-03 DIAGNOSIS — Z79899 Other long term (current) drug therapy: Secondary | ICD-10-CM | POA: Diagnosis not present

## 2016-10-03 DIAGNOSIS — Z801 Family history of malignant neoplasm of trachea, bronchus and lung: Secondary | ICD-10-CM | POA: Diagnosis not present

## 2016-10-03 DIAGNOSIS — R0603 Acute respiratory distress: Secondary | ICD-10-CM | POA: Diagnosis not present

## 2016-10-03 DIAGNOSIS — E785 Hyperlipidemia, unspecified: Secondary | ICD-10-CM | POA: Diagnosis not present

## 2016-10-03 DIAGNOSIS — J9602 Acute respiratory failure with hypercapnia: Secondary | ICD-10-CM | POA: Diagnosis not present

## 2016-10-03 DIAGNOSIS — I6789 Other cerebrovascular disease: Secondary | ICD-10-CM | POA: Diagnosis not present

## 2016-10-03 DIAGNOSIS — S129XXD Fracture of neck, unspecified, subsequent encounter: Secondary | ICD-10-CM | POA: Diagnosis not present

## 2016-10-03 DIAGNOSIS — R0602 Shortness of breath: Secondary | ICD-10-CM | POA: Diagnosis not present

## 2016-10-04 DIAGNOSIS — R29810 Facial weakness: Secondary | ICD-10-CM | POA: Diagnosis not present

## 2016-10-04 DIAGNOSIS — R0902 Hypoxemia: Secondary | ICD-10-CM | POA: Diagnosis not present

## 2016-10-04 DIAGNOSIS — J9602 Acute respiratory failure with hypercapnia: Secondary | ICD-10-CM | POA: Diagnosis not present

## 2016-10-04 DIAGNOSIS — L409 Psoriasis, unspecified: Secondary | ICD-10-CM | POA: Diagnosis not present

## 2016-10-04 DIAGNOSIS — R061 Stridor: Secondary | ICD-10-CM | POA: Diagnosis not present

## 2016-10-04 DIAGNOSIS — Z87828 Personal history of other (healed) physical injury and trauma: Secondary | ICD-10-CM | POA: Diagnosis not present

## 2016-10-04 DIAGNOSIS — J9503 Malfunction of tracheostomy stoma: Secondary | ICD-10-CM | POA: Diagnosis not present

## 2016-10-04 DIAGNOSIS — Z955 Presence of coronary angioplasty implant and graft: Secondary | ICD-10-CM | POA: Diagnosis not present

## 2016-10-04 DIAGNOSIS — Z825 Family history of asthma and other chronic lower respiratory diseases: Secondary | ICD-10-CM | POA: Diagnosis not present

## 2016-10-04 DIAGNOSIS — J9690 Respiratory failure, unspecified, unspecified whether with hypoxia or hypercapnia: Secondary | ICD-10-CM | POA: Diagnosis not present

## 2016-10-04 DIAGNOSIS — J440 Chronic obstructive pulmonary disease with acute lower respiratory infection: Secondary | ICD-10-CM | POA: Diagnosis not present

## 2016-10-04 DIAGNOSIS — E119 Type 2 diabetes mellitus without complications: Secondary | ICD-10-CM | POA: Diagnosis not present

## 2016-10-04 DIAGNOSIS — J9600 Acute respiratory failure, unspecified whether with hypoxia or hypercapnia: Secondary | ICD-10-CM | POA: Diagnosis not present

## 2016-10-04 DIAGNOSIS — M1990 Unspecified osteoarthritis, unspecified site: Secondary | ICD-10-CM | POA: Diagnosis not present

## 2016-10-04 DIAGNOSIS — Z7982 Long term (current) use of aspirin: Secondary | ICD-10-CM | POA: Diagnosis not present

## 2016-10-04 DIAGNOSIS — J15211 Pneumonia due to Methicillin susceptible Staphylococcus aureus: Secondary | ICD-10-CM | POA: Diagnosis not present

## 2016-10-04 DIAGNOSIS — Z801 Family history of malignant neoplasm of trachea, bronchus and lung: Secondary | ICD-10-CM | POA: Diagnosis not present

## 2016-10-04 DIAGNOSIS — I6789 Other cerebrovascular disease: Secondary | ICD-10-CM | POA: Diagnosis not present

## 2016-10-04 DIAGNOSIS — Z79899 Other long term (current) drug therapy: Secondary | ICD-10-CM | POA: Diagnosis not present

## 2016-10-04 DIAGNOSIS — I2510 Atherosclerotic heart disease of native coronary artery without angina pectoris: Secondary | ICD-10-CM | POA: Diagnosis not present

## 2016-10-04 DIAGNOSIS — R0602 Shortness of breath: Secondary | ICD-10-CM | POA: Diagnosis not present

## 2016-10-04 DIAGNOSIS — I10 Essential (primary) hypertension: Secondary | ICD-10-CM | POA: Diagnosis not present

## 2016-10-04 DIAGNOSIS — R0603 Acute respiratory distress: Secondary | ICD-10-CM | POA: Diagnosis not present

## 2016-10-04 DIAGNOSIS — J181 Lobar pneumonia, unspecified organism: Secondary | ICD-10-CM | POA: Diagnosis not present

## 2016-10-04 DIAGNOSIS — Z8249 Family history of ischemic heart disease and other diseases of the circulatory system: Secondary | ICD-10-CM | POA: Diagnosis not present

## 2016-10-04 DIAGNOSIS — J690 Pneumonitis due to inhalation of food and vomit: Secondary | ICD-10-CM | POA: Diagnosis not present

## 2016-10-07 ENCOUNTER — Telehealth (HOSPITAL_COMMUNITY): Payer: Self-pay | Admitting: *Deleted

## 2016-10-07 NOTE — Telephone Encounter (Signed)
left voice message at [phone], regarding scheduling an appointment for patient.

## 2016-10-10 DIAGNOSIS — J398 Other specified diseases of upper respiratory tract: Secondary | ICD-10-CM | POA: Diagnosis not present

## 2016-10-10 DIAGNOSIS — Z930 Tracheostomy status: Secondary | ICD-10-CM | POA: Diagnosis not present

## 2016-10-10 DIAGNOSIS — R1312 Dysphagia, oropharyngeal phase: Secondary | ICD-10-CM | POA: Diagnosis not present

## 2016-10-10 DIAGNOSIS — J188 Other pneumonia, unspecified organism: Secondary | ICD-10-CM | POA: Diagnosis not present

## 2016-10-10 DIAGNOSIS — J383 Other diseases of vocal cords: Secondary | ICD-10-CM | POA: Diagnosis not present

## 2016-10-10 DIAGNOSIS — J9600 Acute respiratory failure, unspecified whether with hypoxia or hypercapnia: Secondary | ICD-10-CM | POA: Diagnosis not present

## 2016-10-10 DIAGNOSIS — J9621 Acute and chronic respiratory failure with hypoxia: Secondary | ICD-10-CM | POA: Diagnosis not present

## 2016-10-10 DIAGNOSIS — R1310 Dysphagia, unspecified: Secondary | ICD-10-CM | POA: Diagnosis not present

## 2016-10-10 DIAGNOSIS — I9581 Postprocedural hypotension: Secondary | ICD-10-CM | POA: Diagnosis not present

## 2016-10-10 DIAGNOSIS — I119 Hypertensive heart disease without heart failure: Secondary | ICD-10-CM | POA: Diagnosis not present

## 2016-10-10 DIAGNOSIS — J9501 Hemorrhage from tracheostomy stoma: Secondary | ICD-10-CM | POA: Diagnosis not present

## 2016-10-10 DIAGNOSIS — J9690 Respiratory failure, unspecified, unspecified whether with hypoxia or hypercapnia: Secondary | ICD-10-CM | POA: Diagnosis not present

## 2016-10-10 DIAGNOSIS — M6281 Muscle weakness (generalized): Secondary | ICD-10-CM | POA: Diagnosis not present

## 2016-10-10 DIAGNOSIS — L929 Granulomatous disorder of the skin and subcutaneous tissue, unspecified: Secondary | ICD-10-CM | POA: Diagnosis not present

## 2016-10-10 DIAGNOSIS — E1165 Type 2 diabetes mellitus with hyperglycemia: Secondary | ICD-10-CM | POA: Diagnosis not present

## 2016-10-10 DIAGNOSIS — J9503 Malfunction of tracheostomy stoma: Secondary | ICD-10-CM | POA: Diagnosis not present

## 2016-10-10 DIAGNOSIS — Z9989 Dependence on other enabling machines and devices: Secondary | ICD-10-CM | POA: Diagnosis not present

## 2016-10-10 DIAGNOSIS — D638 Anemia in other chronic diseases classified elsewhere: Secondary | ICD-10-CM | POA: Diagnosis not present

## 2016-10-10 DIAGNOSIS — J387 Other diseases of larynx: Secondary | ICD-10-CM | POA: Diagnosis not present

## 2016-10-10 DIAGNOSIS — Z87891 Personal history of nicotine dependence: Secondary | ICD-10-CM | POA: Diagnosis not present

## 2016-10-10 DIAGNOSIS — J690 Pneumonitis due to inhalation of food and vomit: Secondary | ICD-10-CM | POA: Diagnosis not present

## 2016-10-10 DIAGNOSIS — J386 Stenosis of larynx: Secondary | ICD-10-CM | POA: Diagnosis not present

## 2016-10-10 DIAGNOSIS — E785 Hyperlipidemia, unspecified: Secondary | ICD-10-CM | POA: Diagnosis not present

## 2016-10-10 DIAGNOSIS — Z9911 Dependence on respirator [ventilator] status: Secondary | ICD-10-CM | POA: Diagnosis not present

## 2016-10-10 DIAGNOSIS — R5381 Other malaise: Secondary | ICD-10-CM | POA: Diagnosis not present

## 2016-10-10 DIAGNOSIS — Z8673 Personal history of transient ischemic attack (TIA), and cerebral infarction without residual deficits: Secondary | ICD-10-CM | POA: Diagnosis not present

## 2016-10-10 DIAGNOSIS — J384 Edema of larynx: Secondary | ICD-10-CM | POA: Diagnosis not present

## 2016-10-10 DIAGNOSIS — Z430 Encounter for attention to tracheostomy: Secondary | ICD-10-CM | POA: Diagnosis not present

## 2016-10-10 DIAGNOSIS — Z7982 Long term (current) use of aspirin: Secondary | ICD-10-CM | POA: Diagnosis not present

## 2016-10-10 DIAGNOSIS — I10 Essential (primary) hypertension: Secondary | ICD-10-CM | POA: Diagnosis not present

## 2016-10-10 DIAGNOSIS — J449 Chronic obstructive pulmonary disease, unspecified: Secondary | ICD-10-CM | POA: Diagnosis not present

## 2016-10-10 DIAGNOSIS — L538 Other specified erythematous conditions: Secondary | ICD-10-CM | POA: Diagnosis not present

## 2016-10-10 DIAGNOSIS — E0821 Diabetes mellitus due to underlying condition with diabetic nephropathy: Secondary | ICD-10-CM | POA: Diagnosis not present

## 2016-10-10 DIAGNOSIS — E119 Type 2 diabetes mellitus without complications: Secondary | ICD-10-CM | POA: Diagnosis not present

## 2016-10-10 DIAGNOSIS — J9620 Acute and chronic respiratory failure, unspecified whether with hypoxia or hypercapnia: Secondary | ICD-10-CM | POA: Diagnosis not present

## 2016-10-10 DIAGNOSIS — J441 Chronic obstructive pulmonary disease with (acute) exacerbation: Secondary | ICD-10-CM | POA: Diagnosis not present

## 2016-10-15 DIAGNOSIS — J9600 Acute respiratory failure, unspecified whether with hypoxia or hypercapnia: Secondary | ICD-10-CM | POA: Diagnosis not present

## 2016-10-15 DIAGNOSIS — L929 Granulomatous disorder of the skin and subcutaneous tissue, unspecified: Secondary | ICD-10-CM | POA: Diagnosis not present

## 2016-11-05 DIAGNOSIS — Z930 Tracheostomy status: Secondary | ICD-10-CM | POA: Diagnosis not present

## 2016-11-18 DIAGNOSIS — J386 Stenosis of larynx: Secondary | ICD-10-CM | POA: Diagnosis not present

## 2016-11-18 DIAGNOSIS — L538 Other specified erythematous conditions: Secondary | ICD-10-CM | POA: Diagnosis not present

## 2016-11-18 DIAGNOSIS — J9503 Malfunction of tracheostomy stoma: Secondary | ICD-10-CM | POA: Diagnosis not present

## 2016-11-18 DIAGNOSIS — Z87891 Personal history of nicotine dependence: Secondary | ICD-10-CM | POA: Diagnosis not present

## 2016-11-18 DIAGNOSIS — J384 Edema of larynx: Secondary | ICD-10-CM | POA: Diagnosis not present

## 2016-11-18 DIAGNOSIS — Z930 Tracheostomy status: Secondary | ICD-10-CM | POA: Diagnosis not present

## 2016-11-18 DIAGNOSIS — J383 Other diseases of vocal cords: Secondary | ICD-10-CM | POA: Diagnosis not present

## 2016-11-21 DIAGNOSIS — M6281 Muscle weakness (generalized): Secondary | ICD-10-CM | POA: Diagnosis not present

## 2016-11-21 DIAGNOSIS — E1165 Type 2 diabetes mellitus with hyperglycemia: Secondary | ICD-10-CM | POA: Diagnosis not present

## 2016-11-21 DIAGNOSIS — Z7982 Long term (current) use of aspirin: Secondary | ICD-10-CM | POA: Diagnosis not present

## 2016-11-21 DIAGNOSIS — Z87891 Personal history of nicotine dependence: Secondary | ICD-10-CM | POA: Diagnosis not present

## 2016-11-21 DIAGNOSIS — R1310 Dysphagia, unspecified: Secondary | ICD-10-CM | POA: Diagnosis not present

## 2016-11-21 DIAGNOSIS — J9621 Acute and chronic respiratory failure with hypoxia: Secondary | ICD-10-CM | POA: Diagnosis not present

## 2016-11-21 DIAGNOSIS — Z7901 Long term (current) use of anticoagulants: Secondary | ICD-10-CM | POA: Diagnosis not present

## 2016-11-21 DIAGNOSIS — E785 Hyperlipidemia, unspecified: Secondary | ICD-10-CM | POA: Diagnosis not present

## 2016-11-21 DIAGNOSIS — Z79899 Other long term (current) drug therapy: Secondary | ICD-10-CM | POA: Diagnosis not present

## 2016-11-21 DIAGNOSIS — K5190 Ulcerative colitis, unspecified, without complications: Secondary | ICD-10-CM | POA: Diagnosis not present

## 2016-11-21 DIAGNOSIS — J988 Other specified respiratory disorders: Secondary | ICD-10-CM | POA: Diagnosis not present

## 2016-11-21 DIAGNOSIS — J9600 Acute respiratory failure, unspecified whether with hypoxia or hypercapnia: Secondary | ICD-10-CM | POA: Diagnosis not present

## 2016-11-21 DIAGNOSIS — J398 Other specified diseases of upper respiratory tract: Secondary | ICD-10-CM | POA: Diagnosis not present

## 2016-11-21 DIAGNOSIS — Z794 Long term (current) use of insulin: Secondary | ICD-10-CM | POA: Diagnosis not present

## 2016-11-21 DIAGNOSIS — Z9911 Dependence on respirator [ventilator] status: Secondary | ICD-10-CM | POA: Diagnosis not present

## 2016-11-21 DIAGNOSIS — E1169 Type 2 diabetes mellitus with other specified complication: Secondary | ICD-10-CM | POA: Diagnosis not present

## 2016-11-21 DIAGNOSIS — M4312 Spondylolisthesis, cervical region: Secondary | ICD-10-CM | POA: Diagnosis not present

## 2016-11-21 DIAGNOSIS — J449 Chronic obstructive pulmonary disease, unspecified: Secondary | ICD-10-CM | POA: Diagnosis not present

## 2016-11-21 DIAGNOSIS — J9620 Acute and chronic respiratory failure, unspecified whether with hypoxia or hypercapnia: Secondary | ICD-10-CM | POA: Diagnosis not present

## 2016-11-21 DIAGNOSIS — Z8673 Personal history of transient ischemic attack (TIA), and cerebral infarction without residual deficits: Secondary | ICD-10-CM | POA: Diagnosis not present

## 2016-11-21 DIAGNOSIS — Z6821 Body mass index (BMI) 21.0-21.9, adult: Secondary | ICD-10-CM | POA: Diagnosis not present

## 2016-11-21 DIAGNOSIS — D638 Anemia in other chronic diseases classified elsewhere: Secondary | ICD-10-CM | POA: Diagnosis not present

## 2016-11-21 DIAGNOSIS — I252 Old myocardial infarction: Secondary | ICD-10-CM | POA: Diagnosis not present

## 2016-11-21 DIAGNOSIS — Z930 Tracheostomy status: Secondary | ICD-10-CM | POA: Diagnosis not present

## 2016-11-21 DIAGNOSIS — S129XXD Fracture of neck, unspecified, subsequent encounter: Secondary | ICD-10-CM | POA: Diagnosis not present

## 2016-11-21 DIAGNOSIS — K219 Gastro-esophageal reflux disease without esophagitis: Secondary | ICD-10-CM | POA: Diagnosis not present

## 2016-11-21 DIAGNOSIS — Z430 Encounter for attention to tracheostomy: Secondary | ICD-10-CM | POA: Diagnosis not present

## 2016-11-21 DIAGNOSIS — J9503 Malfunction of tracheostomy stoma: Secondary | ICD-10-CM | POA: Diagnosis not present

## 2016-11-21 DIAGNOSIS — S22018D Other fracture of first thoracic vertebra, subsequent encounter for fracture with routine healing: Secondary | ICD-10-CM | POA: Diagnosis not present

## 2016-11-21 DIAGNOSIS — Z886 Allergy status to analgesic agent status: Secondary | ICD-10-CM | POA: Diagnosis not present

## 2016-11-21 DIAGNOSIS — I10 Essential (primary) hypertension: Secondary | ICD-10-CM | POA: Diagnosis not present

## 2016-11-21 DIAGNOSIS — I998 Other disorder of circulatory system: Secondary | ICD-10-CM | POA: Diagnosis not present

## 2016-11-24 DIAGNOSIS — J9600 Acute respiratory failure, unspecified whether with hypoxia or hypercapnia: Secondary | ICD-10-CM | POA: Diagnosis not present

## 2016-11-24 DIAGNOSIS — Z9911 Dependence on respirator [ventilator] status: Secondary | ICD-10-CM | POA: Diagnosis not present

## 2016-11-27 DIAGNOSIS — Z9911 Dependence on respirator [ventilator] status: Secondary | ICD-10-CM | POA: Diagnosis not present

## 2016-11-27 DIAGNOSIS — J9600 Acute respiratory failure, unspecified whether with hypoxia or hypercapnia: Secondary | ICD-10-CM | POA: Diagnosis not present

## 2016-12-04 DIAGNOSIS — Z430 Encounter for attention to tracheostomy: Secondary | ICD-10-CM | POA: Diagnosis not present

## 2016-12-04 DIAGNOSIS — K219 Gastro-esophageal reflux disease without esophagitis: Secondary | ICD-10-CM | POA: Diagnosis not present

## 2016-12-04 DIAGNOSIS — Z6821 Body mass index (BMI) 21.0-21.9, adult: Secondary | ICD-10-CM | POA: Diagnosis not present

## 2016-12-04 DIAGNOSIS — M4310 Spondylolisthesis, site unspecified: Secondary | ICD-10-CM | POA: Insufficient documentation

## 2016-12-04 DIAGNOSIS — J449 Chronic obstructive pulmonary disease, unspecified: Secondary | ICD-10-CM | POA: Diagnosis not present

## 2016-12-04 DIAGNOSIS — Z794 Long term (current) use of insulin: Secondary | ICD-10-CM | POA: Diagnosis not present

## 2016-12-04 DIAGNOSIS — Z7982 Long term (current) use of aspirin: Secondary | ICD-10-CM | POA: Diagnosis not present

## 2016-12-04 DIAGNOSIS — S22018D Other fracture of first thoracic vertebra, subsequent encounter for fracture with routine healing: Secondary | ICD-10-CM | POA: Diagnosis not present

## 2016-12-04 DIAGNOSIS — Z886 Allergy status to analgesic agent status: Secondary | ICD-10-CM | POA: Diagnosis not present

## 2016-12-04 DIAGNOSIS — K5190 Ulcerative colitis, unspecified, without complications: Secondary | ICD-10-CM | POA: Diagnosis not present

## 2016-12-04 DIAGNOSIS — S129XXD Fracture of neck, unspecified, subsequent encounter: Secondary | ICD-10-CM | POA: Diagnosis not present

## 2016-12-04 DIAGNOSIS — Z930 Tracheostomy status: Secondary | ICD-10-CM | POA: Diagnosis not present

## 2016-12-04 DIAGNOSIS — E1169 Type 2 diabetes mellitus with other specified complication: Secondary | ICD-10-CM | POA: Diagnosis not present

## 2016-12-04 DIAGNOSIS — G4733 Obstructive sleep apnea (adult) (pediatric): Secondary | ICD-10-CM | POA: Insufficient documentation

## 2016-12-04 DIAGNOSIS — M4312 Spondylolisthesis, cervical region: Secondary | ICD-10-CM | POA: Diagnosis not present

## 2016-12-04 DIAGNOSIS — Z7901 Long term (current) use of anticoagulants: Secondary | ICD-10-CM | POA: Diagnosis not present

## 2016-12-04 DIAGNOSIS — I252 Old myocardial infarction: Secondary | ICD-10-CM | POA: Diagnosis not present

## 2016-12-04 DIAGNOSIS — J9503 Malfunction of tracheostomy stoma: Secondary | ICD-10-CM | POA: Diagnosis not present

## 2016-12-04 DIAGNOSIS — Z87891 Personal history of nicotine dependence: Secondary | ICD-10-CM | POA: Diagnosis not present

## 2016-12-04 DIAGNOSIS — Z79899 Other long term (current) drug therapy: Secondary | ICD-10-CM | POA: Diagnosis not present

## 2016-12-04 DIAGNOSIS — I998 Other disorder of circulatory system: Secondary | ICD-10-CM | POA: Diagnosis not present

## 2016-12-04 DIAGNOSIS — Z8673 Personal history of transient ischemic attack (TIA), and cerebral infarction without residual deficits: Secondary | ICD-10-CM | POA: Diagnosis not present

## 2016-12-04 DIAGNOSIS — J398 Other specified diseases of upper respiratory tract: Secondary | ICD-10-CM | POA: Diagnosis not present

## 2016-12-04 DIAGNOSIS — I10 Essential (primary) hypertension: Secondary | ICD-10-CM | POA: Diagnosis not present

## 2016-12-05 DIAGNOSIS — Z79899 Other long term (current) drug therapy: Secondary | ICD-10-CM | POA: Diagnosis not present

## 2016-12-05 DIAGNOSIS — Z87891 Personal history of nicotine dependence: Secondary | ICD-10-CM | POA: Diagnosis not present

## 2016-12-05 DIAGNOSIS — K5190 Ulcerative colitis, unspecified, without complications: Secondary | ICD-10-CM | POA: Diagnosis not present

## 2016-12-05 DIAGNOSIS — I10 Essential (primary) hypertension: Secondary | ICD-10-CM | POA: Diagnosis not present

## 2016-12-05 DIAGNOSIS — Z794 Long term (current) use of insulin: Secondary | ICD-10-CM | POA: Diagnosis not present

## 2016-12-05 DIAGNOSIS — Z6821 Body mass index (BMI) 21.0-21.9, adult: Secondary | ICD-10-CM | POA: Diagnosis not present

## 2016-12-05 DIAGNOSIS — Z930 Tracheostomy status: Secondary | ICD-10-CM | POA: Diagnosis not present

## 2016-12-05 DIAGNOSIS — Z430 Encounter for attention to tracheostomy: Secondary | ICD-10-CM | POA: Diagnosis not present

## 2016-12-05 DIAGNOSIS — I252 Old myocardial infarction: Secondary | ICD-10-CM | POA: Diagnosis not present

## 2016-12-05 DIAGNOSIS — J9503 Malfunction of tracheostomy stoma: Secondary | ICD-10-CM | POA: Diagnosis not present

## 2016-12-05 DIAGNOSIS — Z886 Allergy status to analgesic agent status: Secondary | ICD-10-CM | POA: Diagnosis not present

## 2016-12-05 DIAGNOSIS — E1169 Type 2 diabetes mellitus with other specified complication: Secondary | ICD-10-CM | POA: Diagnosis not present

## 2016-12-05 DIAGNOSIS — Z7982 Long term (current) use of aspirin: Secondary | ICD-10-CM | POA: Diagnosis not present

## 2016-12-05 DIAGNOSIS — Z7901 Long term (current) use of anticoagulants: Secondary | ICD-10-CM | POA: Diagnosis not present

## 2016-12-05 DIAGNOSIS — M4312 Spondylolisthesis, cervical region: Secondary | ICD-10-CM | POA: Diagnosis not present

## 2016-12-05 DIAGNOSIS — J449 Chronic obstructive pulmonary disease, unspecified: Secondary | ICD-10-CM | POA: Diagnosis not present

## 2016-12-05 DIAGNOSIS — K219 Gastro-esophageal reflux disease without esophagitis: Secondary | ICD-10-CM | POA: Diagnosis not present

## 2016-12-05 DIAGNOSIS — J398 Other specified diseases of upper respiratory tract: Secondary | ICD-10-CM | POA: Diagnosis not present

## 2016-12-05 DIAGNOSIS — Z8673 Personal history of transient ischemic attack (TIA), and cerebral infarction without residual deficits: Secondary | ICD-10-CM | POA: Diagnosis not present

## 2016-12-06 DIAGNOSIS — K5190 Ulcerative colitis, unspecified, without complications: Secondary | ICD-10-CM | POA: Diagnosis not present

## 2016-12-06 DIAGNOSIS — Z7901 Long term (current) use of anticoagulants: Secondary | ICD-10-CM | POA: Diagnosis not present

## 2016-12-06 DIAGNOSIS — Z430 Encounter for attention to tracheostomy: Secondary | ICD-10-CM | POA: Diagnosis not present

## 2016-12-06 DIAGNOSIS — Z794 Long term (current) use of insulin: Secondary | ICD-10-CM | POA: Diagnosis not present

## 2016-12-06 DIAGNOSIS — M4312 Spondylolisthesis, cervical region: Secondary | ICD-10-CM | POA: Diagnosis not present

## 2016-12-06 DIAGNOSIS — Z87891 Personal history of nicotine dependence: Secondary | ICD-10-CM | POA: Diagnosis not present

## 2016-12-06 DIAGNOSIS — I10 Essential (primary) hypertension: Secondary | ICD-10-CM | POA: Diagnosis not present

## 2016-12-06 DIAGNOSIS — J449 Chronic obstructive pulmonary disease, unspecified: Secondary | ICD-10-CM | POA: Diagnosis not present

## 2016-12-06 DIAGNOSIS — Z7982 Long term (current) use of aspirin: Secondary | ICD-10-CM | POA: Diagnosis not present

## 2016-12-06 DIAGNOSIS — K219 Gastro-esophageal reflux disease without esophagitis: Secondary | ICD-10-CM | POA: Diagnosis not present

## 2016-12-06 DIAGNOSIS — Z79899 Other long term (current) drug therapy: Secondary | ICD-10-CM | POA: Diagnosis not present

## 2016-12-06 DIAGNOSIS — Z886 Allergy status to analgesic agent status: Secondary | ICD-10-CM | POA: Diagnosis not present

## 2016-12-06 DIAGNOSIS — Z6821 Body mass index (BMI) 21.0-21.9, adult: Secondary | ICD-10-CM | POA: Diagnosis not present

## 2016-12-06 DIAGNOSIS — J398 Other specified diseases of upper respiratory tract: Secondary | ICD-10-CM | POA: Diagnosis not present

## 2016-12-06 DIAGNOSIS — J9503 Malfunction of tracheostomy stoma: Secondary | ICD-10-CM | POA: Diagnosis not present

## 2016-12-06 DIAGNOSIS — Z8673 Personal history of transient ischemic attack (TIA), and cerebral infarction without residual deficits: Secondary | ICD-10-CM | POA: Diagnosis not present

## 2016-12-06 DIAGNOSIS — E1169 Type 2 diabetes mellitus with other specified complication: Secondary | ICD-10-CM | POA: Diagnosis not present

## 2016-12-06 DIAGNOSIS — I252 Old myocardial infarction: Secondary | ICD-10-CM | POA: Diagnosis not present

## 2016-12-06 DIAGNOSIS — Z930 Tracheostomy status: Secondary | ICD-10-CM | POA: Diagnosis not present

## 2016-12-07 DIAGNOSIS — Z430 Encounter for attention to tracheostomy: Secondary | ICD-10-CM | POA: Diagnosis not present

## 2016-12-07 DIAGNOSIS — J398 Other specified diseases of upper respiratory tract: Secondary | ICD-10-CM | POA: Diagnosis not present

## 2016-12-07 DIAGNOSIS — Z930 Tracheostomy status: Secondary | ICD-10-CM | POA: Diagnosis not present

## 2016-12-07 DIAGNOSIS — Z6821 Body mass index (BMI) 21.0-21.9, adult: Secondary | ICD-10-CM | POA: Diagnosis not present

## 2016-12-07 DIAGNOSIS — J9503 Malfunction of tracheostomy stoma: Secondary | ICD-10-CM | POA: Diagnosis not present

## 2016-12-07 DIAGNOSIS — Z7982 Long term (current) use of aspirin: Secondary | ICD-10-CM | POA: Diagnosis not present

## 2016-12-07 DIAGNOSIS — J449 Chronic obstructive pulmonary disease, unspecified: Secondary | ICD-10-CM | POA: Diagnosis not present

## 2016-12-07 DIAGNOSIS — I10 Essential (primary) hypertension: Secondary | ICD-10-CM | POA: Diagnosis not present

## 2016-12-07 DIAGNOSIS — I252 Old myocardial infarction: Secondary | ICD-10-CM | POA: Diagnosis not present

## 2016-12-07 DIAGNOSIS — E1169 Type 2 diabetes mellitus with other specified complication: Secondary | ICD-10-CM | POA: Diagnosis not present

## 2016-12-07 DIAGNOSIS — Z87891 Personal history of nicotine dependence: Secondary | ICD-10-CM | POA: Diagnosis not present

## 2016-12-07 DIAGNOSIS — Z7901 Long term (current) use of anticoagulants: Secondary | ICD-10-CM | POA: Diagnosis not present

## 2016-12-07 DIAGNOSIS — Z794 Long term (current) use of insulin: Secondary | ICD-10-CM | POA: Diagnosis not present

## 2016-12-07 DIAGNOSIS — M4312 Spondylolisthesis, cervical region: Secondary | ICD-10-CM | POA: Diagnosis not present

## 2016-12-07 DIAGNOSIS — Z886 Allergy status to analgesic agent status: Secondary | ICD-10-CM | POA: Diagnosis not present

## 2016-12-07 DIAGNOSIS — K219 Gastro-esophageal reflux disease without esophagitis: Secondary | ICD-10-CM | POA: Diagnosis not present

## 2016-12-07 DIAGNOSIS — Z8673 Personal history of transient ischemic attack (TIA), and cerebral infarction without residual deficits: Secondary | ICD-10-CM | POA: Diagnosis not present

## 2016-12-07 DIAGNOSIS — K5190 Ulcerative colitis, unspecified, without complications: Secondary | ICD-10-CM | POA: Diagnosis not present

## 2016-12-07 DIAGNOSIS — Z79899 Other long term (current) drug therapy: Secondary | ICD-10-CM | POA: Diagnosis not present

## 2016-12-08 DIAGNOSIS — I10 Essential (primary) hypertension: Secondary | ICD-10-CM | POA: Diagnosis not present

## 2016-12-08 DIAGNOSIS — E1169 Type 2 diabetes mellitus with other specified complication: Secondary | ICD-10-CM | POA: Diagnosis not present

## 2016-12-08 DIAGNOSIS — Z430 Encounter for attention to tracheostomy: Secondary | ICD-10-CM | POA: Diagnosis not present

## 2016-12-08 DIAGNOSIS — K219 Gastro-esophageal reflux disease without esophagitis: Secondary | ICD-10-CM | POA: Diagnosis not present

## 2016-12-08 DIAGNOSIS — Z87891 Personal history of nicotine dependence: Secondary | ICD-10-CM | POA: Diagnosis not present

## 2016-12-08 DIAGNOSIS — Z930 Tracheostomy status: Secondary | ICD-10-CM | POA: Diagnosis not present

## 2016-12-08 DIAGNOSIS — J449 Chronic obstructive pulmonary disease, unspecified: Secondary | ICD-10-CM | POA: Diagnosis not present

## 2016-12-08 DIAGNOSIS — Z794 Long term (current) use of insulin: Secondary | ICD-10-CM | POA: Diagnosis not present

## 2016-12-08 DIAGNOSIS — Z8673 Personal history of transient ischemic attack (TIA), and cerebral infarction without residual deficits: Secondary | ICD-10-CM | POA: Diagnosis not present

## 2016-12-08 DIAGNOSIS — J9503 Malfunction of tracheostomy stoma: Secondary | ICD-10-CM | POA: Diagnosis not present

## 2016-12-08 DIAGNOSIS — Z79899 Other long term (current) drug therapy: Secondary | ICD-10-CM | POA: Diagnosis not present

## 2016-12-08 DIAGNOSIS — Z886 Allergy status to analgesic agent status: Secondary | ICD-10-CM | POA: Diagnosis not present

## 2016-12-08 DIAGNOSIS — I252 Old myocardial infarction: Secondary | ICD-10-CM | POA: Diagnosis not present

## 2016-12-08 DIAGNOSIS — Z6821 Body mass index (BMI) 21.0-21.9, adult: Secondary | ICD-10-CM | POA: Diagnosis not present

## 2016-12-08 DIAGNOSIS — K5190 Ulcerative colitis, unspecified, without complications: Secondary | ICD-10-CM | POA: Diagnosis not present

## 2016-12-08 DIAGNOSIS — M4312 Spondylolisthesis, cervical region: Secondary | ICD-10-CM | POA: Diagnosis not present

## 2016-12-08 DIAGNOSIS — Z7901 Long term (current) use of anticoagulants: Secondary | ICD-10-CM | POA: Diagnosis not present

## 2016-12-08 DIAGNOSIS — J398 Other specified diseases of upper respiratory tract: Secondary | ICD-10-CM | POA: Diagnosis not present

## 2016-12-08 DIAGNOSIS — Z7982 Long term (current) use of aspirin: Secondary | ICD-10-CM | POA: Diagnosis not present

## 2016-12-09 DIAGNOSIS — K5190 Ulcerative colitis, unspecified, without complications: Secondary | ICD-10-CM | POA: Diagnosis not present

## 2016-12-09 DIAGNOSIS — Z930 Tracheostomy status: Secondary | ICD-10-CM | POA: Diagnosis not present

## 2016-12-09 DIAGNOSIS — Z8673 Personal history of transient ischemic attack (TIA), and cerebral infarction without residual deficits: Secondary | ICD-10-CM | POA: Diagnosis not present

## 2016-12-09 DIAGNOSIS — Z886 Allergy status to analgesic agent status: Secondary | ICD-10-CM | POA: Diagnosis not present

## 2016-12-09 DIAGNOSIS — I252 Old myocardial infarction: Secondary | ICD-10-CM | POA: Diagnosis not present

## 2016-12-09 DIAGNOSIS — Z79899 Other long term (current) drug therapy: Secondary | ICD-10-CM | POA: Diagnosis not present

## 2016-12-09 DIAGNOSIS — M4312 Spondylolisthesis, cervical region: Secondary | ICD-10-CM | POA: Diagnosis not present

## 2016-12-09 DIAGNOSIS — Z794 Long term (current) use of insulin: Secondary | ICD-10-CM | POA: Diagnosis not present

## 2016-12-09 DIAGNOSIS — Z87891 Personal history of nicotine dependence: Secondary | ICD-10-CM | POA: Diagnosis not present

## 2016-12-09 DIAGNOSIS — Z7901 Long term (current) use of anticoagulants: Secondary | ICD-10-CM | POA: Diagnosis not present

## 2016-12-09 DIAGNOSIS — J398 Other specified diseases of upper respiratory tract: Secondary | ICD-10-CM | POA: Diagnosis not present

## 2016-12-09 DIAGNOSIS — J449 Chronic obstructive pulmonary disease, unspecified: Secondary | ICD-10-CM | POA: Diagnosis not present

## 2016-12-09 DIAGNOSIS — E1169 Type 2 diabetes mellitus with other specified complication: Secondary | ICD-10-CM | POA: Diagnosis not present

## 2016-12-09 DIAGNOSIS — Z6821 Body mass index (BMI) 21.0-21.9, adult: Secondary | ICD-10-CM | POA: Diagnosis not present

## 2016-12-09 DIAGNOSIS — J9503 Malfunction of tracheostomy stoma: Secondary | ICD-10-CM | POA: Diagnosis not present

## 2016-12-09 DIAGNOSIS — I10 Essential (primary) hypertension: Secondary | ICD-10-CM | POA: Diagnosis not present

## 2016-12-09 DIAGNOSIS — Z7982 Long term (current) use of aspirin: Secondary | ICD-10-CM | POA: Diagnosis not present

## 2016-12-09 DIAGNOSIS — K219 Gastro-esophageal reflux disease without esophagitis: Secondary | ICD-10-CM | POA: Diagnosis not present

## 2016-12-09 DIAGNOSIS — Z430 Encounter for attention to tracheostomy: Secondary | ICD-10-CM | POA: Diagnosis not present

## 2016-12-11 ENCOUNTER — Ambulatory Visit (INDEPENDENT_AMBULATORY_CARE_PROVIDER_SITE_OTHER): Payer: Medicare Other | Admitting: Family

## 2016-12-11 ENCOUNTER — Encounter: Payer: Self-pay | Admitting: Family

## 2016-12-11 VITALS — BP 135/69 | HR 88 | Temp 97.0°F | Ht 69.0 in | Wt 156.0 lb

## 2016-12-11 DIAGNOSIS — F329 Major depressive disorder, single episode, unspecified: Secondary | ICD-10-CM | POA: Diagnosis not present

## 2016-12-11 DIAGNOSIS — I10 Essential (primary) hypertension: Secondary | ICD-10-CM | POA: Diagnosis not present

## 2016-12-11 DIAGNOSIS — Z09 Encounter for follow-up examination after completed treatment for conditions other than malignant neoplasm: Secondary | ICD-10-CM

## 2016-12-11 DIAGNOSIS — E1142 Type 2 diabetes mellitus with diabetic polyneuropathy: Secondary | ICD-10-CM | POA: Diagnosis not present

## 2016-12-11 DIAGNOSIS — F32A Depression, unspecified: Secondary | ICD-10-CM

## 2016-12-11 DIAGNOSIS — F419 Anxiety disorder, unspecified: Secondary | ICD-10-CM | POA: Diagnosis not present

## 2016-12-11 LAB — BAYER DCA HB A1C WAIVED: HB A1C: 6.5 % (ref <7.0)

## 2016-12-11 MED ORDER — ALPRAZOLAM 0.5 MG PO TABS: 0.5000 mg | ORAL_TABLET | Freq: Two times a day (BID) | ORAL | 1 refills | Status: DC | PRN
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
## 2016-12-11 NOTE — Patient Instructions (Signed)

## 2016-12-12 ENCOUNTER — Other Ambulatory Visit: Payer: Self-pay | Admitting: Family

## 2016-12-12 LAB — CMP14+EGFR
A/G RATIO: 1.3 (ref 1.2–2.2)
ALBUMIN: 3.9 g/dL (ref 3.6–4.8)
ALK PHOS: 160 IU/L — AB (ref 39–117)
ALT: 25 IU/L (ref 0–44)
AST: 18 IU/L (ref 0–40)
BUN/Creatinine Ratio: 26 — ABNORMAL HIGH (ref 10–24)
BUN: 18 mg/dL (ref 8–27)
Bilirubin Total: 0.3 mg/dL (ref 0.0–1.2)
CO2: 22 mmol/L (ref 20–29)
CREATININE: 0.69 mg/dL — AB (ref 0.76–1.27)
Calcium: 9.4 mg/dL (ref 8.6–10.2)
Chloride: 102 mmol/L (ref 96–106)
GFR calc Af Amer: 113 mL/min/{1.73_m2} (ref >59)
GFR, EST NON AFRICAN AMERICAN: 98 mL/min/{1.73_m2} (ref >59)
GLOBULIN, TOTAL: 2.9 g/dL (ref 1.5–4.5)
Glucose: 116 mg/dL — ABNORMAL HIGH (ref 65–99)
Potassium: 4.2 mmol/L (ref 3.5–5.2)
SODIUM: 142 mmol/L (ref 134–144)
Total Protein: 6.8 g/dL (ref 6.0–8.5)

## 2016-12-12 MED ORDER — METFORMIN HCL ER 500 MG PO TB24: 500.0000 mg | ORAL_TABLET | Freq: Every day | ORAL | 11 refills | Status: DC

## 2016-12-13 DIAGNOSIS — E1169 Type 2 diabetes mellitus with other specified complication: Secondary | ICD-10-CM | POA: Diagnosis not present

## 2016-12-13 DIAGNOSIS — S129XXD Fracture of neck, unspecified, subsequent encounter: Secondary | ICD-10-CM | POA: Diagnosis not present

## 2016-12-13 DIAGNOSIS — I252 Old myocardial infarction: Secondary | ICD-10-CM | POA: Diagnosis not present

## 2016-12-13 DIAGNOSIS — Z87891 Personal history of nicotine dependence: Secondary | ICD-10-CM | POA: Diagnosis not present

## 2016-12-13 DIAGNOSIS — S2242XD Multiple fractures of ribs, left side, subsequent encounter for fracture with routine healing: Secondary | ICD-10-CM | POA: Diagnosis not present

## 2016-12-13 DIAGNOSIS — J410 Simple chronic bronchitis: Secondary | ICD-10-CM | POA: Diagnosis not present

## 2016-12-13 DIAGNOSIS — S32009D Unspecified fracture of unspecified lumbar vertebra, subsequent encounter for fracture with routine healing: Secondary | ICD-10-CM | POA: Diagnosis not present

## 2016-12-13 DIAGNOSIS — M4310 Spondylolisthesis, site unspecified: Secondary | ICD-10-CM | POA: Diagnosis not present

## 2016-12-13 DIAGNOSIS — J9503 Malfunction of tracheostomy stoma: Secondary | ICD-10-CM | POA: Diagnosis not present

## 2016-12-13 DIAGNOSIS — S12591D Other nondisplaced fracture of sixth cervical vertebra, subsequent encounter for fracture with routine healing: Secondary | ICD-10-CM | POA: Diagnosis not present

## 2016-12-13 DIAGNOSIS — I10 Essential (primary) hypertension: Secondary | ICD-10-CM | POA: Diagnosis not present

## 2016-12-13 DIAGNOSIS — G4733 Obstructive sleep apnea (adult) (pediatric): Secondary | ICD-10-CM | POA: Diagnosis not present

## 2016-12-16 ENCOUNTER — Telehealth: Payer: Self-pay | Admitting: *Deleted

## 2016-12-16 DIAGNOSIS — Z9889 Other specified postprocedural states: Secondary | ICD-10-CM | POA: Diagnosis not present

## 2016-12-16 DIAGNOSIS — J439 Emphysema, unspecified: Secondary | ICD-10-CM

## 2016-12-16 DIAGNOSIS — J384 Edema of larynx: Secondary | ICD-10-CM | POA: Diagnosis not present

## 2016-12-16 DIAGNOSIS — Z930 Tracheostomy status: Secondary | ICD-10-CM | POA: Diagnosis not present

## 2016-12-16 DIAGNOSIS — J383 Other diseases of vocal cords: Secondary | ICD-10-CM | POA: Diagnosis not present

## 2016-12-16 DIAGNOSIS — J9503 Malfunction of tracheostomy stoma: Secondary | ICD-10-CM | POA: Diagnosis not present

## 2016-12-16 DIAGNOSIS — J386 Stenosis of larynx: Secondary | ICD-10-CM | POA: Diagnosis not present

## 2016-12-16 MED ORDER — PREGABALIN 50 MG PO CAPS: 50.0000 mg | ORAL_CAPSULE | Freq: Three times a day (TID) | ORAL | 1 refills | Status: DC

## 2016-12-16 MED ORDER — ALBUTEROL SULFATE (2.5 MG/3ML) 0.083% IN NEBU: 2.5000 mg | INHALATION_SOLUTION | Freq: Four times a day (QID) | RESPIRATORY_TRACT | 1 refills | Status: DC | PRN

## 2016-12-16 NOTE — Telephone Encounter (Signed)
Please call in Ronald [name] for pt and fax nebulizer machine rx to pharmacy. Thanks

## 2016-12-16 NOTE — Telephone Encounter (Signed)
Home health nurse Estill Bamberg called. Pt wants to know if he can have Lyrica and also a nebulizer machine

## 2016-12-17 ENCOUNTER — Telehealth: Payer: Self-pay | Admitting: Family

## 2016-12-17 DIAGNOSIS — E1169 Type 2 diabetes mellitus with other specified complication: Secondary | ICD-10-CM | POA: Diagnosis not present

## 2016-12-17 DIAGNOSIS — I252 Old myocardial infarction: Secondary | ICD-10-CM | POA: Diagnosis not present

## 2016-12-17 DIAGNOSIS — Z87891 Personal history of nicotine dependence: Secondary | ICD-10-CM | POA: Diagnosis not present

## 2016-12-17 DIAGNOSIS — S2242XD Multiple fractures of ribs, left side, subsequent encounter for fracture with routine healing: Secondary | ICD-10-CM | POA: Diagnosis not present

## 2016-12-17 DIAGNOSIS — S129XXD Fracture of neck, unspecified, subsequent encounter: Secondary | ICD-10-CM | POA: Diagnosis not present

## 2016-12-17 DIAGNOSIS — I10 Essential (primary) hypertension: Secondary | ICD-10-CM | POA: Diagnosis not present

## 2016-12-17 DIAGNOSIS — J410 Simple chronic bronchitis: Secondary | ICD-10-CM | POA: Diagnosis not present

## 2016-12-17 DIAGNOSIS — S12591D Other nondisplaced fracture of sixth cervical vertebra, subsequent encounter for fracture with routine healing: Secondary | ICD-10-CM | POA: Diagnosis not present

## 2016-12-17 DIAGNOSIS — J9503 Malfunction of tracheostomy stoma: Secondary | ICD-10-CM | POA: Diagnosis not present

## 2016-12-17 DIAGNOSIS — M4310 Spondylolisthesis, site unspecified: Secondary | ICD-10-CM | POA: Diagnosis not present

## 2016-12-17 DIAGNOSIS — S32009D Unspecified fracture of unspecified lumbar vertebra, subsequent encounter for fracture with routine healing: Secondary | ICD-10-CM | POA: Diagnosis not present

## 2016-12-17 DIAGNOSIS — G4733 Obstructive sleep apnea (adult) (pediatric): Secondary | ICD-10-CM | POA: Diagnosis not present

## 2016-12-17 NOTE — Telephone Encounter (Signed)
Script called into [facility] script faxed Notified [facility]

## 2016-12-17 NOTE — Telephone Encounter (Signed)
Pharmacist called because she was told by family that pt was still hospitalized and in a coma Informed that pt had OV on 12/11/2016 Pharmacy will contact family

## 2016-12-19 DIAGNOSIS — S2242XD Multiple fractures of ribs, left side, subsequent encounter for fracture with routine healing: Secondary | ICD-10-CM | POA: Diagnosis not present

## 2016-12-19 DIAGNOSIS — Z87891 Personal history of nicotine dependence: Secondary | ICD-10-CM | POA: Diagnosis not present

## 2016-12-19 DIAGNOSIS — I252 Old myocardial infarction: Secondary | ICD-10-CM | POA: Diagnosis not present

## 2016-12-19 DIAGNOSIS — S129XXD Fracture of neck, unspecified, subsequent encounter: Secondary | ICD-10-CM | POA: Diagnosis not present

## 2016-12-19 DIAGNOSIS — I10 Essential (primary) hypertension: Secondary | ICD-10-CM | POA: Diagnosis not present

## 2016-12-19 DIAGNOSIS — G4733 Obstructive sleep apnea (adult) (pediatric): Secondary | ICD-10-CM | POA: Diagnosis not present

## 2016-12-19 DIAGNOSIS — S12591D Other nondisplaced fracture of sixth cervical vertebra, subsequent encounter for fracture with routine healing: Secondary | ICD-10-CM | POA: Diagnosis not present

## 2016-12-19 DIAGNOSIS — M4310 Spondylolisthesis, site unspecified: Secondary | ICD-10-CM | POA: Diagnosis not present

## 2016-12-19 DIAGNOSIS — E1169 Type 2 diabetes mellitus with other specified complication: Secondary | ICD-10-CM | POA: Diagnosis not present

## 2016-12-19 DIAGNOSIS — J9503 Malfunction of tracheostomy stoma: Secondary | ICD-10-CM | POA: Diagnosis not present

## 2016-12-19 DIAGNOSIS — S32009D Unspecified fracture of unspecified lumbar vertebra, subsequent encounter for fracture with routine healing: Secondary | ICD-10-CM | POA: Diagnosis not present

## 2016-12-19 DIAGNOSIS — J410 Simple chronic bronchitis: Secondary | ICD-10-CM | POA: Diagnosis not present

## 2016-12-23 ENCOUNTER — Ambulatory Visit (INDEPENDENT_AMBULATORY_CARE_PROVIDER_SITE_OTHER): Payer: Medicare Other | Admitting: Family Medicine

## 2016-12-23 ENCOUNTER — Encounter: Payer: Self-pay | Admitting: Family Medicine

## 2016-12-23 VITALS — BP 133/74 | HR 90 | Temp 97.8°F | Ht 69.0 in | Wt 163.0 lb

## 2016-12-23 DIAGNOSIS — I252 Old myocardial infarction: Secondary | ICD-10-CM | POA: Diagnosis not present

## 2016-12-23 DIAGNOSIS — S12591D Other nondisplaced fracture of sixth cervical vertebra, subsequent encounter for fracture with routine healing: Secondary | ICD-10-CM | POA: Diagnosis not present

## 2016-12-23 DIAGNOSIS — S2242XD Multiple fractures of ribs, left side, subsequent encounter for fracture with routine healing: Secondary | ICD-10-CM | POA: Diagnosis not present

## 2016-12-23 DIAGNOSIS — I10 Essential (primary) hypertension: Secondary | ICD-10-CM | POA: Diagnosis not present

## 2016-12-23 DIAGNOSIS — S32009D Unspecified fracture of unspecified lumbar vertebra, subsequent encounter for fracture with routine healing: Secondary | ICD-10-CM | POA: Diagnosis not present

## 2016-12-23 DIAGNOSIS — E1169 Type 2 diabetes mellitus with other specified complication: Secondary | ICD-10-CM | POA: Diagnosis not present

## 2016-12-23 DIAGNOSIS — G4733 Obstructive sleep apnea (adult) (pediatric): Secondary | ICD-10-CM | POA: Diagnosis not present

## 2016-12-23 DIAGNOSIS — L309 Dermatitis, unspecified: Secondary | ICD-10-CM | POA: Diagnosis not present

## 2016-12-23 DIAGNOSIS — J410 Simple chronic bronchitis: Secondary | ICD-10-CM | POA: Diagnosis not present

## 2016-12-23 DIAGNOSIS — J9503 Malfunction of tracheostomy stoma: Secondary | ICD-10-CM | POA: Diagnosis not present

## 2016-12-23 DIAGNOSIS — S129XXD Fracture of neck, unspecified, subsequent encounter: Secondary | ICD-10-CM | POA: Diagnosis not present

## 2016-12-23 DIAGNOSIS — M4310 Spondylolisthesis, site unspecified: Secondary | ICD-10-CM | POA: Diagnosis not present

## 2016-12-23 DIAGNOSIS — Z87891 Personal history of nicotine dependence: Secondary | ICD-10-CM | POA: Diagnosis not present

## 2016-12-23 MED ORDER — HALOBETASOL PROPIONATE 0.05 % EX CREA: TOPICAL_CREAM | Freq: Two times a day (BID) | CUTANEOUS | 0 refills | Status: DC

## 2016-12-23 MED ORDER — PREDNISONE 10 MG PO TABS: ORAL_TABLET | ORAL | 0 refills | Status: DC

## 2016-12-23 MED ORDER — GLUCOSE BLOOD VI STRP: ORAL_STRIP | 2 refills | Status: DC

## 2016-12-23 NOTE — Progress Notes (Signed)
Chief Complaint  Patient presents with  . Rash    arms, face and chest x 2 days    HPI  Patient presents today for Pruritic rash on the forearms and to a lesser degree on the face and chest. It is been present for couple of days. It is associated with some redness in addition to the itching.  PMH: Smoking status noted ROS: Per HPI  Objective: BP 133/74 (BP Location: Left Arm)   Pulse 90   Temp 97.8 F (36.6 C) (Oral)   Ht 5\' 9"  (1.753 m)   Wt 163 lb (73.9 kg)   BMI 24.07 kg/m  Gen: NAD, alert, cooperative with exam HEENT: NCAT, EOMI, PERRL CV: RRR, good S1/S2, no murmur Resp: CTABL, no wheezes, non-labored Abd: SNTND, BS present, no guarding or organomegaly Ext: No edema, warm Neuro: Alert and oriented, No gross deficits Skin: Thick plaques yellowish gray at the extensor elbows and proximal forearms. Maculopapular erythema on the forehead and central chest Assessment and plan:  1. Severe eczema     Meds ordered this encounter  Medications  . DISCONTD: predniSONE (DELTASONE) 10 MG tablet    Sig: Take 5 daily for 2 days followed by 4,3,2 and 1 for 2 days each.    Dispense:  30 tablet    Refill:  0  . DISCONTD: halobetasol (ULTRAVATE) 0.05 % cream    Sig: Apply topically 2 (two) times daily. Do NOT apply above the neck    Dispense:  50 g    Refill:  0  . DISCONTD: glucose blood (ONE TOUCH ULTRA TEST) test strip    Sig: USE ONE STRIP TO CHECK GLUCOSE ONCE DAILY    Dispense:  100 each    Refill:  2    E11.9  . predniSONE (DELTASONE) 10 MG tablet    Sig: Take 5 daily for 2 days followed by 4,3,2 and 1 for 2 days each.    Dispense:  30 tablet    Refill:  0  . halobetasol (ULTRAVATE) 0.05 % cream    Sig: Apply topically 2 (two) times daily. Do NOT apply above the neck    Dispense:  50 g    Refill:  0  . glucose blood (ONE TOUCH ULTRA TEST) test strip    Sig: USE ONE STRIP TO CHECK GLUCOSE ONCE DAILY    Dispense:  100 each    Refill:  2    E11.9    No orders of  the defined types were placed in this encounter.   Follow up as needed.  Claretta Fraise, MD

## 2016-12-24 ENCOUNTER — Telehealth: Payer: Self-pay | Admitting: *Deleted

## 2016-12-24 NOTE — Telephone Encounter (Signed)
Pharmacy notified.

## 2016-12-24 NOTE — Telephone Encounter (Signed)
Please contact the patient I gave prednisone for the face. Halobetasol is for the plaques on his arms.

## 2016-12-24 NOTE — Telephone Encounter (Signed)
Pharmacist called regarding RX for Halobetasol cream RX says not to use above neck Pt's rash is mainly on face Do you want to RX something else Please advise

## 2016-12-25 DIAGNOSIS — G4733 Obstructive sleep apnea (adult) (pediatric): Secondary | ICD-10-CM | POA: Diagnosis not present

## 2016-12-25 DIAGNOSIS — S2242XD Multiple fractures of ribs, left side, subsequent encounter for fracture with routine healing: Secondary | ICD-10-CM | POA: Diagnosis not present

## 2016-12-25 DIAGNOSIS — S129XXD Fracture of neck, unspecified, subsequent encounter: Secondary | ICD-10-CM | POA: Diagnosis not present

## 2016-12-25 DIAGNOSIS — E1169 Type 2 diabetes mellitus with other specified complication: Secondary | ICD-10-CM | POA: Diagnosis not present

## 2016-12-25 DIAGNOSIS — J9503 Malfunction of tracheostomy stoma: Secondary | ICD-10-CM | POA: Diagnosis not present

## 2016-12-25 DIAGNOSIS — I10 Essential (primary) hypertension: Secondary | ICD-10-CM | POA: Diagnosis not present

## 2016-12-25 DIAGNOSIS — M4310 Spondylolisthesis, site unspecified: Secondary | ICD-10-CM | POA: Diagnosis not present

## 2016-12-25 DIAGNOSIS — S12591D Other nondisplaced fracture of sixth cervical vertebra, subsequent encounter for fracture with routine healing: Secondary | ICD-10-CM | POA: Diagnosis not present

## 2016-12-25 DIAGNOSIS — Z87891 Personal history of nicotine dependence: Secondary | ICD-10-CM | POA: Diagnosis not present

## 2016-12-25 DIAGNOSIS — I252 Old myocardial infarction: Secondary | ICD-10-CM | POA: Diagnosis not present

## 2016-12-25 DIAGNOSIS — J410 Simple chronic bronchitis: Secondary | ICD-10-CM | POA: Diagnosis not present

## 2016-12-25 DIAGNOSIS — S32009D Unspecified fracture of unspecified lumbar vertebra, subsequent encounter for fracture with routine healing: Secondary | ICD-10-CM | POA: Diagnosis not present

## 2016-12-30 DIAGNOSIS — J410 Simple chronic bronchitis: Secondary | ICD-10-CM | POA: Diagnosis not present

## 2016-12-30 DIAGNOSIS — S129XXD Fracture of neck, unspecified, subsequent encounter: Secondary | ICD-10-CM | POA: Diagnosis not present

## 2016-12-30 DIAGNOSIS — S12591D Other nondisplaced fracture of sixth cervical vertebra, subsequent encounter for fracture with routine healing: Secondary | ICD-10-CM | POA: Diagnosis not present

## 2016-12-30 DIAGNOSIS — I10 Essential (primary) hypertension: Secondary | ICD-10-CM | POA: Diagnosis not present

## 2016-12-30 DIAGNOSIS — G4733 Obstructive sleep apnea (adult) (pediatric): Secondary | ICD-10-CM | POA: Diagnosis not present

## 2016-12-30 DIAGNOSIS — I252 Old myocardial infarction: Secondary | ICD-10-CM | POA: Diagnosis not present

## 2016-12-30 DIAGNOSIS — S2242XD Multiple fractures of ribs, left side, subsequent encounter for fracture with routine healing: Secondary | ICD-10-CM | POA: Diagnosis not present

## 2016-12-30 DIAGNOSIS — J9503 Malfunction of tracheostomy stoma: Secondary | ICD-10-CM | POA: Diagnosis not present

## 2016-12-30 DIAGNOSIS — E1169 Type 2 diabetes mellitus with other specified complication: Secondary | ICD-10-CM | POA: Diagnosis not present

## 2016-12-30 DIAGNOSIS — M4310 Spondylolisthesis, site unspecified: Secondary | ICD-10-CM | POA: Diagnosis not present

## 2016-12-30 DIAGNOSIS — Z87891 Personal history of nicotine dependence: Secondary | ICD-10-CM | POA: Diagnosis not present

## 2016-12-30 DIAGNOSIS — S32009D Unspecified fracture of unspecified lumbar vertebra, subsequent encounter for fracture with routine healing: Secondary | ICD-10-CM | POA: Diagnosis not present

## 2016-12-31 DIAGNOSIS — J410 Simple chronic bronchitis: Secondary | ICD-10-CM | POA: Diagnosis not present

## 2016-12-31 DIAGNOSIS — S2242XD Multiple fractures of ribs, left side, subsequent encounter for fracture with routine healing: Secondary | ICD-10-CM | POA: Diagnosis not present

## 2016-12-31 DIAGNOSIS — I10 Essential (primary) hypertension: Secondary | ICD-10-CM | POA: Diagnosis not present

## 2016-12-31 DIAGNOSIS — E1169 Type 2 diabetes mellitus with other specified complication: Secondary | ICD-10-CM | POA: Diagnosis not present

## 2016-12-31 DIAGNOSIS — G4733 Obstructive sleep apnea (adult) (pediatric): Secondary | ICD-10-CM | POA: Diagnosis not present

## 2016-12-31 DIAGNOSIS — S12591D Other nondisplaced fracture of sixth cervical vertebra, subsequent encounter for fracture with routine healing: Secondary | ICD-10-CM | POA: Diagnosis not present

## 2016-12-31 DIAGNOSIS — J9503 Malfunction of tracheostomy stoma: Secondary | ICD-10-CM | POA: Diagnosis not present

## 2016-12-31 DIAGNOSIS — Z87891 Personal history of nicotine dependence: Secondary | ICD-10-CM | POA: Diagnosis not present

## 2016-12-31 DIAGNOSIS — S32009D Unspecified fracture of unspecified lumbar vertebra, subsequent encounter for fracture with routine healing: Secondary | ICD-10-CM | POA: Diagnosis not present

## 2016-12-31 DIAGNOSIS — I252 Old myocardial infarction: Secondary | ICD-10-CM | POA: Diagnosis not present

## 2016-12-31 DIAGNOSIS — M4310 Spondylolisthesis, site unspecified: Secondary | ICD-10-CM | POA: Diagnosis not present

## 2016-12-31 DIAGNOSIS — S129XXD Fracture of neck, unspecified, subsequent encounter: Secondary | ICD-10-CM | POA: Diagnosis not present

## 2017-01-01 DIAGNOSIS — Z87891 Personal history of nicotine dependence: Secondary | ICD-10-CM | POA: Diagnosis not present

## 2017-01-01 DIAGNOSIS — E119 Type 2 diabetes mellitus without complications: Secondary | ICD-10-CM | POA: Diagnosis not present

## 2017-01-01 DIAGNOSIS — Z794 Long term (current) use of insulin: Secondary | ICD-10-CM | POA: Diagnosis not present

## 2017-01-01 DIAGNOSIS — J398 Other specified diseases of upper respiratory tract: Secondary | ICD-10-CM | POA: Diagnosis not present

## 2017-01-01 DIAGNOSIS — J386 Stenosis of larynx: Secondary | ICD-10-CM | POA: Diagnosis not present

## 2017-01-01 DIAGNOSIS — Z930 Tracheostomy status: Secondary | ICD-10-CM | POA: Diagnosis not present

## 2017-01-01 DIAGNOSIS — J9503 Malfunction of tracheostomy stoma: Secondary | ICD-10-CM | POA: Diagnosis not present

## 2017-01-02 DIAGNOSIS — Z87891 Personal history of nicotine dependence: Secondary | ICD-10-CM | POA: Diagnosis not present

## 2017-01-02 DIAGNOSIS — Z794 Long term (current) use of insulin: Secondary | ICD-10-CM | POA: Diagnosis not present

## 2017-01-02 DIAGNOSIS — E119 Type 2 diabetes mellitus without complications: Secondary | ICD-10-CM | POA: Diagnosis not present

## 2017-01-02 DIAGNOSIS — J398 Other specified diseases of upper respiratory tract: Secondary | ICD-10-CM | POA: Diagnosis not present

## 2017-01-02 DIAGNOSIS — J9503 Malfunction of tracheostomy stoma: Secondary | ICD-10-CM | POA: Diagnosis not present

## 2017-01-07 DIAGNOSIS — M4310 Spondylolisthesis, site unspecified: Secondary | ICD-10-CM | POA: Diagnosis not present

## 2017-01-07 DIAGNOSIS — J9503 Malfunction of tracheostomy stoma: Secondary | ICD-10-CM | POA: Diagnosis not present

## 2017-01-07 DIAGNOSIS — E1169 Type 2 diabetes mellitus with other specified complication: Secondary | ICD-10-CM | POA: Diagnosis not present

## 2017-01-07 DIAGNOSIS — S129XXD Fracture of neck, unspecified, subsequent encounter: Secondary | ICD-10-CM | POA: Diagnosis not present

## 2017-01-07 DIAGNOSIS — S12591D Other nondisplaced fracture of sixth cervical vertebra, subsequent encounter for fracture with routine healing: Secondary | ICD-10-CM | POA: Diagnosis not present

## 2017-01-07 DIAGNOSIS — Z87891 Personal history of nicotine dependence: Secondary | ICD-10-CM | POA: Diagnosis not present

## 2017-01-07 DIAGNOSIS — S2242XD Multiple fractures of ribs, left side, subsequent encounter for fracture with routine healing: Secondary | ICD-10-CM | POA: Diagnosis not present

## 2017-01-07 DIAGNOSIS — S32009D Unspecified fracture of unspecified lumbar vertebra, subsequent encounter for fracture with routine healing: Secondary | ICD-10-CM | POA: Diagnosis not present

## 2017-01-07 DIAGNOSIS — J410 Simple chronic bronchitis: Secondary | ICD-10-CM | POA: Diagnosis not present

## 2017-01-07 DIAGNOSIS — G4733 Obstructive sleep apnea (adult) (pediatric): Secondary | ICD-10-CM | POA: Diagnosis not present

## 2017-01-07 DIAGNOSIS — I252 Old myocardial infarction: Secondary | ICD-10-CM | POA: Diagnosis not present

## 2017-01-07 DIAGNOSIS — I10 Essential (primary) hypertension: Secondary | ICD-10-CM | POA: Diagnosis not present

## 2017-01-08 DIAGNOSIS — J9503 Malfunction of tracheostomy stoma: Secondary | ICD-10-CM | POA: Diagnosis not present

## 2017-01-09 DIAGNOSIS — S129XXD Fracture of neck, unspecified, subsequent encounter: Secondary | ICD-10-CM | POA: Diagnosis not present

## 2017-01-09 DIAGNOSIS — M4310 Spondylolisthesis, site unspecified: Secondary | ICD-10-CM | POA: Diagnosis not present

## 2017-01-09 DIAGNOSIS — J410 Simple chronic bronchitis: Secondary | ICD-10-CM | POA: Diagnosis not present

## 2017-01-09 DIAGNOSIS — J9503 Malfunction of tracheostomy stoma: Secondary | ICD-10-CM | POA: Diagnosis not present

## 2017-01-09 DIAGNOSIS — Z930 Tracheostomy status: Secondary | ICD-10-CM | POA: Diagnosis not present

## 2017-01-09 DIAGNOSIS — I10 Essential (primary) hypertension: Secondary | ICD-10-CM | POA: Diagnosis not present

## 2017-01-09 DIAGNOSIS — S12591D Other nondisplaced fracture of sixth cervical vertebra, subsequent encounter for fracture with routine healing: Secondary | ICD-10-CM | POA: Diagnosis not present

## 2017-01-09 DIAGNOSIS — S32009D Unspecified fracture of unspecified lumbar vertebra, subsequent encounter for fracture with routine healing: Secondary | ICD-10-CM | POA: Diagnosis not present

## 2017-01-09 DIAGNOSIS — G4733 Obstructive sleep apnea (adult) (pediatric): Secondary | ICD-10-CM | POA: Diagnosis not present

## 2017-01-09 DIAGNOSIS — Z87891 Personal history of nicotine dependence: Secondary | ICD-10-CM | POA: Diagnosis not present

## 2017-01-09 DIAGNOSIS — E1169 Type 2 diabetes mellitus with other specified complication: Secondary | ICD-10-CM | POA: Diagnosis not present

## 2017-01-09 DIAGNOSIS — S2242XD Multiple fractures of ribs, left side, subsequent encounter for fracture with routine healing: Secondary | ICD-10-CM | POA: Diagnosis not present

## 2017-01-09 DIAGNOSIS — I252 Old myocardial infarction: Secondary | ICD-10-CM | POA: Diagnosis not present

## 2017-01-13 DIAGNOSIS — S2242XD Multiple fractures of ribs, left side, subsequent encounter for fracture with routine healing: Secondary | ICD-10-CM | POA: Diagnosis not present

## 2017-01-13 DIAGNOSIS — I252 Old myocardial infarction: Secondary | ICD-10-CM | POA: Diagnosis not present

## 2017-01-13 DIAGNOSIS — S12591D Other nondisplaced fracture of sixth cervical vertebra, subsequent encounter for fracture with routine healing: Secondary | ICD-10-CM | POA: Diagnosis not present

## 2017-01-13 DIAGNOSIS — J9503 Malfunction of tracheostomy stoma: Secondary | ICD-10-CM | POA: Diagnosis not present

## 2017-01-13 DIAGNOSIS — E1169 Type 2 diabetes mellitus with other specified complication: Secondary | ICD-10-CM | POA: Diagnosis not present

## 2017-01-13 DIAGNOSIS — S129XXD Fracture of neck, unspecified, subsequent encounter: Secondary | ICD-10-CM | POA: Diagnosis not present

## 2017-01-13 DIAGNOSIS — J410 Simple chronic bronchitis: Secondary | ICD-10-CM | POA: Diagnosis not present

## 2017-01-13 DIAGNOSIS — Z87891 Personal history of nicotine dependence: Secondary | ICD-10-CM | POA: Diagnosis not present

## 2017-01-13 DIAGNOSIS — I10 Essential (primary) hypertension: Secondary | ICD-10-CM | POA: Diagnosis not present

## 2017-01-13 DIAGNOSIS — M4310 Spondylolisthesis, site unspecified: Secondary | ICD-10-CM | POA: Diagnosis not present

## 2017-01-13 DIAGNOSIS — S32009D Unspecified fracture of unspecified lumbar vertebra, subsequent encounter for fracture with routine healing: Secondary | ICD-10-CM | POA: Diagnosis not present

## 2017-01-13 DIAGNOSIS — G4733 Obstructive sleep apnea (adult) (pediatric): Secondary | ICD-10-CM | POA: Diagnosis not present

## 2017-01-15 DIAGNOSIS — I10 Essential (primary) hypertension: Secondary | ICD-10-CM | POA: Diagnosis not present

## 2017-01-15 DIAGNOSIS — S12591D Other nondisplaced fracture of sixth cervical vertebra, subsequent encounter for fracture with routine healing: Secondary | ICD-10-CM | POA: Diagnosis not present

## 2017-01-15 DIAGNOSIS — J410 Simple chronic bronchitis: Secondary | ICD-10-CM | POA: Diagnosis not present

## 2017-01-15 DIAGNOSIS — M4310 Spondylolisthesis, site unspecified: Secondary | ICD-10-CM | POA: Diagnosis not present

## 2017-01-15 DIAGNOSIS — Z87891 Personal history of nicotine dependence: Secondary | ICD-10-CM | POA: Diagnosis not present

## 2017-01-15 DIAGNOSIS — S129XXD Fracture of neck, unspecified, subsequent encounter: Secondary | ICD-10-CM | POA: Diagnosis not present

## 2017-01-15 DIAGNOSIS — S32009D Unspecified fracture of unspecified lumbar vertebra, subsequent encounter for fracture with routine healing: Secondary | ICD-10-CM | POA: Diagnosis not present

## 2017-01-15 DIAGNOSIS — G4733 Obstructive sleep apnea (adult) (pediatric): Secondary | ICD-10-CM | POA: Diagnosis not present

## 2017-01-15 DIAGNOSIS — J9503 Malfunction of tracheostomy stoma: Secondary | ICD-10-CM | POA: Diagnosis not present

## 2017-01-15 DIAGNOSIS — I252 Old myocardial infarction: Secondary | ICD-10-CM | POA: Diagnosis not present

## 2017-01-15 DIAGNOSIS — E1169 Type 2 diabetes mellitus with other specified complication: Secondary | ICD-10-CM | POA: Diagnosis not present

## 2017-01-15 DIAGNOSIS — S2242XD Multiple fractures of ribs, left side, subsequent encounter for fracture with routine healing: Secondary | ICD-10-CM | POA: Diagnosis not present

## 2017-01-16 ENCOUNTER — Encounter: Payer: Self-pay | Admitting: Family

## 2017-01-16 ENCOUNTER — Ambulatory Visit (INDEPENDENT_AMBULATORY_CARE_PROVIDER_SITE_OTHER): Payer: Medicare Other | Admitting: Family

## 2017-01-16 VITALS — BP 158/86 | HR 93 | Temp 98.2°F | Ht 69.0 in | Wt 163.2 lb

## 2017-01-16 DIAGNOSIS — F419 Anxiety disorder, unspecified: Secondary | ICD-10-CM

## 2017-01-16 DIAGNOSIS — G4700 Insomnia, unspecified: Secondary | ICD-10-CM | POA: Diagnosis not present

## 2017-01-16 DIAGNOSIS — K219 Gastro-esophageal reflux disease without esophagitis: Secondary | ICD-10-CM | POA: Diagnosis not present

## 2017-01-16 DIAGNOSIS — F329 Major depressive disorder, single episode, unspecified: Secondary | ICD-10-CM | POA: Diagnosis not present

## 2017-01-16 DIAGNOSIS — I2510 Atherosclerotic heart disease of native coronary artery without angina pectoris: Secondary | ICD-10-CM

## 2017-01-16 DIAGNOSIS — J439 Emphysema, unspecified: Secondary | ICD-10-CM

## 2017-01-16 DIAGNOSIS — F17210 Nicotine dependence, cigarettes, uncomplicated: Secondary | ICD-10-CM

## 2017-01-16 DIAGNOSIS — E1142 Type 2 diabetes mellitus with diabetic polyneuropathy: Secondary | ICD-10-CM

## 2017-01-16 DIAGNOSIS — F32A Depression, unspecified: Secondary | ICD-10-CM

## 2017-01-16 DIAGNOSIS — E785 Hyperlipidemia, unspecified: Secondary | ICD-10-CM

## 2017-01-16 DIAGNOSIS — I10 Essential (primary) hypertension: Secondary | ICD-10-CM

## 2017-01-16 DIAGNOSIS — E1140 Type 2 diabetes mellitus with diabetic neuropathy, unspecified: Secondary | ICD-10-CM | POA: Insufficient documentation

## 2017-01-16 LAB — BAYER DCA HB A1C WAIVED: HB A1C: 6.2 % (ref <7.0)

## 2017-01-16 MED ORDER — PREGABALIN 50 MG PO CAPS: 50.0000 mg | ORAL_CAPSULE | Freq: Three times a day (TID) | ORAL | 5 refills | Status: DC

## 2017-01-16 MED ORDER — IPRATROPIUM-ALBUTEROL 0.5-2.5 (3) MG/3ML IN SOLN: 3.0000 mL | Freq: Four times a day (QID) | RESPIRATORY_TRACT | 3 refills | Status: DC

## 2017-01-16 MED ORDER — HYDRALAZINE HCL 10 MG PO TABS: 10.0000 mg | ORAL_TABLET | Freq: Three times a day (TID) | ORAL | 1 refills | Status: DC

## 2017-01-16 MED ORDER — MIRTAZAPINE 15 MG PO TABS: 15.0000 mg | ORAL_TABLET | Freq: Every day | ORAL | 1 refills | Status: DC

## 2017-01-16 MED ORDER — AMLODIPINE BESYLATE 10 MG PO TABS: 10.0000 mg | ORAL_TABLET | Freq: Every day | ORAL | 3 refills | Status: DC

## 2017-01-16 MED ORDER — ATORVASTATIN CALCIUM 40 MG PO TABS: 40.0000 mg | ORAL_TABLET | Freq: Every day | ORAL | 1 refills | Status: DC

## 2017-01-16 MED ORDER — METOPROLOL TARTRATE 25 MG PO TABS: 25.0000 mg | ORAL_TABLET | Freq: Two times a day (BID) | ORAL | 1 refills | Status: DC

## 2017-01-16 MED ORDER — PANTOPRAZOLE SODIUM 40 MG PO TBEC: 40.0000 mg | DELAYED_RELEASE_TABLET | Freq: Every day | ORAL | 3 refills | Status: DC

## 2017-01-16 MED ORDER — ALPRAZOLAM 0.5 MG PO TABS: 0.5000 mg | ORAL_TABLET | Freq: Two times a day (BID) | ORAL | 3 refills | Status: DC | PRN

## 2017-01-16 NOTE — Patient Instructions (Signed)
Diabetic Neuropathy Diabetic neuropathy is a nerve disease or nerve damage that is caused by diabetes mellitus. About half of all people with diabetes mellitus have some form of nerve damage. Nerve damage is more common in those who have had diabetes mellitus for many years and who generally have not had good control of their blood sugar (glucose) level. Diabetic neuropathy is a common complication of diabetes mellitus. There are three common types of diabetic neuropathy and a fourth type that is less common and less understood:  Peripheral neuropathy-This is the most common type of diabetic neuropathy. It causes damage to the nerves of the feet and legs first and then eventually the hands and arms. The damage affects the ability to sense touch.  Autonomic neuropathy-This type causes damage to the autonomic nervous system, which controls the following functions: ? Heartbeat. ? Body temperature. ? Blood pressure. ? Urination. ? Digestion. ? Sweating. ? Sexual function.  Focal neuropathy-Focal neuropathy can be painful and unpredictable and occurs most often in older adults with diabetes mellitus. It involves a specific nerve or one area and often comes on suddenly. It usually does not cause long-term problems.  Radiculoplexus neuropathy- Sometimes called lumbosacral radiculoplexus neuropathy, radiculoplexus neuropathy affects the nerves of the thighs, hips, buttocks, or legs. It is more common in people with type 2 diabetes mellitus and in older men. It is characterized by debilitating pain, weakness, and atrophy, usually in the thigh muscles.  What are the causes? The cause of peripheral, autonomic, and focal neuropathies is diabetes mellitus that is uncontrolled and high glucose levels. The cause of radiculoplexus neuropathy is unknown. However, it is thought to be caused by inflammation related to uncontrolled glucose levels. What are the signs or symptoms? Peripheral Neuropathy Peripheral  neuropathy develops slowly over time. When the nerves of the feet and legs no longer work there may be:  Burning, stabbing, or aching pain in the legs or feet.  Inability to feel pressure or pain in your feet. This can lead to: ? Thick calluses over pressure areas. ? Pressure sores. ? Ulcers.  Foot deformities.  Reduced ability to feel temperature changes.  Muscle weakness.  Autonomic Neuropathy The symptoms of autonomic neuropathy vary depending on which nerves are affected. Symptoms may include:  Problems with digestion, such as: ? Feeling sick to your stomach (nausea). ? Vomiting. ? Bloating. ? Constipation. ? Diarrhea. ? Abdominal pain.  Difficulty with urination. This occurs if you lose your ability to sense when your bladder is full. Problems include: ? Urine leakage (incontinence). ? Inability to empty your bladder completely (retention).  Rapid or irregular heartbeat (palpitations).  Blood pressure drops when you stand up (orthostatic hypotension). When you stand up you may feel: ? Dizzy. ? Weak. ? Faint.  In men, inability to attain and maintain an erection.  In women, vaginal dryness and problems with decreased sexual desire and arousal.  Problems with body temperature regulation.  Increased or decreased sweating.  Focal Neuropathy  Abnormal eye movements or abnormal alignment of both eyes.  Weakness in the wrist.  Foot drop. This results in an inability to lift the foot properly and abnormal walking or foot movement.  Paralysis on one side of your face (Bell palsy).  Chest or abdominal pain. Radiculoplexus Neuropathy  Sudden, severe pain in your hip, thigh, or buttocks.  Weakness and wasting of thigh muscles.  Difficulty rising from a seated position.  Abdominal swelling.  Unexplained weight loss (usually more than 10 lb [4.5 kg]). How is   this diagnosed? Peripheral Neuropathy Your senses may be tested. Sensory function testing can be  done with:  A light touch using a monofilament.  A vibration with tuning fork.  A sharp sensation with a pin prick.  Other tests that can help diagnose neuropathy are:  Nerve conduction velocity. This test checks the transmission of an electrical current through a nerve.  Electromyography. This shows how muscles respond to electrical signals transmitted by nearby nerves.  Quantitative sensory testing. This is used to assess how your nerves respond to vibrations and changes in temperature.  Autonomic Neuropathy Diagnosis is often based on reported symptoms. Tell your health care provider if you experience:  Dizziness.  Constipation.  Diarrhea.  Inappropriate urination or inability to urinate.  Inability to get or maintain an erection.  Tests that may be done include:  Electrocardiography or Holter monitor. These are tests that can help show problems with the heart rate or heart rhythm.  An X-ray exam may be done.  Focal Neuropathy Diagnosis is made based on your symptoms and what your health care provider finds during your exam. Other tests may be done. They may include:  Nerve conduction velocities. This checks the transmission of electrical current through a nerve.  Electromyography. This shows how muscles respond to electrical signals transmitted by nearby nerves.  Quantitative sensory testing. This test is used to assess how your nerves respond to vibration and changes in temperature.  Radiculoplexus Neuropathy  Often the first thing is to eliminate any other issue or problems that might be the cause, as there is no standard test for diagnosis.  X-ray exam of your spine and lumbar region.  Spinal tap to rule out cancer.  MRI to rule out other lesions. How is this treated? Once nerve damage occurs, it cannot be reversed. The goal of treatment is to keep the disease or nerve damage from getting worse and affecting more nerve fibers. Controlling your blood  glucose level is the key. Most people with radiculoplexus neuropathy see at least a partial improvement over time. You will need to keep your blood glucose and HbA1c levels in the target range determined by your health care provider. Things that help control blood glucose levels include:  Blood glucose monitoring.  Meal planning.  Physical activity.  Diabetes medicine.  Over time, maintaining lower blood glucose levels helps lessen symptoms. Sometimes, prescription pain medicine is needed. Follow these instructions at home:  Do not smoke.  Keep your blood glucose level in the range that you and your health care provider have determined acceptable for you.  Keep your blood pressure level in the range that you and your health care provider have determined acceptable for you.  Eat a well-balanced diet.  Be physically active every day. Include strength training and balance exercises.  Protect your feet. ? Check your feet every day for sores, cuts, blisters, or signs of infection. ? Wear padded socks and supportive shoes. Use orthotic inserts, if necessary. ? Regularly check the insides of your shoes for worn spots. Make sure there are no rocks or other items inside your shoes before you put them on. Contact a health care provider if:  You have burning, stabbing, or aching pain in the legs or feet.  You are unable to feel pressure or pain in your feet.  You develop problems with digestion such as: ? Nausea. ? Vomiting. ? Bloating. ? Constipation. ? Diarrhea. ? Abdominal pain.  You have difficulty with urination, such as: ? Incontinence. ? Retention.    You have palpitations.  You develop orthostatic hypotension. When you stand up you may feel: ? Dizzy. ? Weak. ? Faint.  You cannot attain and maintain an erection (in men).  You have vaginal dryness and problems with decreased sexual desire and arousal (in women).  You have severe pain in your thighs, legs, or  buttocks.  You have unexplained weight loss. This information is not intended to replace advice given to you by your health care provider. Make sure you discuss any questions you have with your health care provider. Document Released: 08/04/2001 Document Revised: 11/01/2015 Document Reviewed: 11/04/2012 Elsevier Interactive Patient Education  2017 [employer]  

## 2017-01-16 NOTE — Progress Notes (Signed)
Subjective:    Patient ID: Ronald [name], male    DOB: [birth year], 71 y.o.   MRN: [id number]  Pt presents to the office today. PT's trach was removed 01/01/17 and doing well with that. Pt states he has been out of BP meds for last two days.  Hypertension  This is a chronic problem. The current episode started more than 1 year ago. The problem has been waxing and waning since onset. The problem is uncontrolled. Associated symptoms include anxiety. Pertinent negatives include no blurred vision, peripheral edema or shortness of breath. The current treatment provides moderate improvement. Hypertensive end-organ damage includes CAD/MI.  Diabetes  He presents for his follow-up diabetic visit. He has type 2 diabetes mellitus. His disease course has been stable. Hypoglycemia symptoms include nervousness/anxiousness. There are no diabetic associated symptoms. Pertinent negatives for diabetes include no blurred vision, no foot paresthesias and no visual change. There are no hypoglycemic complications. Symptoms are stable. Pertinent negatives for diabetic complications include no heart disease. Risk factors for coronary artery disease include diabetes mellitus. His weight is stable. His breakfast blood glucose range is generally 130-140 mg/dl.  Anxiety  Presents for follow-up visit. Symptoms include excessive worry, insomnia, irritability, nervous/anxious behavior and restlessness. Patient reports no shortness of breath. Symptoms occur most days.    Gastroesophageal Reflux  He reports no belching, no coughing or no heartburn. This is a chronic problem. The current episode started more than 1 year ago. The problem occurs rarely. The problem has been resolved. He has tried a PPI for the symptoms. The treatment provided moderate relief.  Insomnia  Primary symptoms: difficulty falling asleep, frequent awakening.  The current episode started more than one year. The onset quality is gradual. The problem occurs  intermittently.  Hyperlipidemia  This is a chronic problem. The current episode started more than 1 year ago. The problem is controlled. Recent lipid tests were reviewed and are normal. Exacerbating diseases include obesity. Pertinent negatives include no shortness of breath. Current antihyperlipidemic treatment includes statins. The current treatment provides significant improvement of lipids. Risk factors for coronary artery disease include dyslipidemia, male sex and a sedentary lifestyle.  COPD PT uses Duoneb at bedtime. PT states he smokes "every now and then".  Diabetic Neuropathy PT reports burning pain of 8 out 10. Pt states he has not taken any Lyrica since 06/2016. Pt states this has helped in the past.    Review of Systems  Constitutional: Positive for irritability.  Eyes: Negative for blurred vision.  Respiratory: Negative for cough and shortness of breath.   Gastrointestinal: Negative for heartburn.  Psychiatric/Behavioral: The patient is nervous/anxious and has insomnia.   All other systems reviewed and are negative.      Objective:   Physical Exam  Constitutional: He is oriented to person, place, and time. He appears well-developed and well-nourished. No distress.  HENT:  Head: Normocephalic.  Right Ear: External ear normal.  Left Ear: External ear normal.  Mouth/Throat: Oropharynx is clear and moist.  Eyes: Pupils are equal, round, and reactive to light. Right eye exhibits no discharge. Left eye exhibits no discharge.  Neck: Normal range of motion. Neck supple. No thyromegaly present.  Trach stoma   Cardiovascular: Normal rate, regular rhythm, normal heart sounds and intact distal pulses.   No murmur heard. Pulmonary/Chest: Effort normal. No respiratory distress. He has decreased breath sounds. He has no wheezes.  Abdominal: Soft. Bowel sounds are normal. He exhibits no distension. There is no tenderness.  Musculoskeletal: Normal  range of motion. He exhibits no edema  or tenderness.  Neurological: He is alert and oriented to person, place, and time.  Skin: Skin is warm and dry. No rash noted. No erythema.  Psychiatric: He has a normal mood and affect. His behavior is normal. Judgment and thought content normal.  Vitals reviewed.     BP (!) 158/86   Pulse 93   Temp 98.2 F (36.8 C) (Oral)   Ht 5' 9"  (1.753 m)   Wt 163 lb 3.2 oz (74 kg)   BMI 24.10 kg/m      Assessment & Plan:  1. Essential hypertension - amLODipine (NORVASC) 10 MG tablet; Take 1 tablet (10 mg total) by mouth daily.  Dispense: 90 tablet; Refill: 3 - hydrALAZINE (APRESOLINE) 10 MG tablet; Take 1 tablet (10 mg total) by mouth 3 (three) times daily.  Dispense: 270 tablet; Refill: 1 - metoprolol tartrate (LOPRESSOR) 25 MG tablet; Take 1 tablet (25 mg total) by mouth 2 (two) times daily.  Dispense: 180 tablet; Refill: 1 - CMP14+EGFR  2. CAD in native artery - CMP14+EGFR - Lipid panel  3. Pulmonary emphysema, unspecified emphysema type ([place])  - ipratropium-albuterol (DUONEB) 0.5-2.5 (3) MG/3ML SOLN; Take 3 mLs by nebulization 4 (four) times daily.  Dispense: 360 mL; Refill: 3 - CMP14+EGFR  4. Type 2 diabetes mellitus with diabetic polyneuropathy, without long-term current use of insulin (HCC) - pregabalin (LYRICA) 50 MG capsule; Take 1 capsule (50 mg total) by mouth 3 (three) times daily.  Dispense: 90 capsule; Refill: 5 - Bayer DCA Hb A1c Waived - CMP14+EGFR - Ambulatory referral to Ophthalmology  5. Cigarette smoker  - CMP14+EGFR  6. Anxiety and depression - ALPRAZolam (XANAX) 0.5 MG tablet; Take 1 tablet (0.5 mg total) by mouth 2 (two) times daily as needed for anxiety.  Dispense: 60 tablet; Refill: 3 - CMP14+EGFR  7. Gastroesophageal reflux disease, esophagitis presence not specified - pantoprazole (PROTONIX) 40 MG tablet; Take 1 tablet (40 mg total) by mouth daily.  Dispense: 90 tablet; Refill: 3 - CMP14+EGFR  8. Diabetic polyneuropathy associated with type 2  diabetes mellitus (HCC)  - pregabalin (LYRICA) 50 MG capsule; Take 1 capsule (50 mg total) by mouth 3 (three) times daily.  Dispense: 90 capsule; Refill: 5 - CMP14+EGFR  9. Hyperlipidemia, unspecified hyperlipidemia type  - atorvastatin (LIPITOR) 40 MG tablet; Take 1 tablet (40 mg total) by mouth daily at 6 PM.  Dispense: 90 tablet; Refill: 1 - CMP14+EGFR - Lipid panel  10. Insomnia, unspecified type  - mirtazapine (REMERON) 15 MG tablet; Take 1 tablet (15 mg total) by mouth daily.  Dispense: 90 tablet; Refill: 1 - CMP14+EGFR   Continue all meds Labs pending Health Maintenance reviewed Diet and exercise encouraged RTO 3 months   [name], FNP

## 2017-01-17 LAB — LIPID PANEL
Chol/HDL Ratio: 4.5 ratio (ref 0.0–5.0)
Cholesterol, Total: 171 mg/dL (ref 100–199)
HDL: 38 mg/dL — AB (ref >39)
LDL Calculated: 93 mg/dL (ref 0–99)
Triglycerides: 198 mg/dL — ABNORMAL HIGH (ref 0–149)
VLDL Cholesterol Cal: 40 mg/dL (ref 5–40)

## 2017-01-17 LAB — CMP14+EGFR
A/G RATIO: 1.4 (ref 1.2–2.2)
ALT: 8 IU/L (ref 0–44)
AST: 14 IU/L (ref 0–40)
Albumin: 4 g/dL (ref 3.6–4.8)
Alkaline Phosphatase: 120 IU/L — ABNORMAL HIGH (ref 39–117)
BILIRUBIN TOTAL: 0.3 mg/dL (ref 0.0–1.2)
BUN/Creatinine Ratio: 18 (ref 10–24)
BUN: 16 mg/dL (ref 8–27)
CHLORIDE: 103 mmol/L (ref 96–106)
CO2: 25 mmol/L (ref 20–29)
Calcium: 9.3 mg/dL (ref 8.6–10.2)
Creatinine, Ser: 0.91 mg/dL (ref 0.76–1.27)
GFR calc non Af Amer: 86 mL/min/{1.73_m2} (ref >59)
GFR, EST AFRICAN AMERICAN: 100 mL/min/{1.73_m2} (ref >59)
Globulin, Total: 2.9 g/dL (ref 1.5–4.5)
Glucose: 94 mg/dL (ref 65–99)
POTASSIUM: 4.4 mmol/L (ref 3.5–5.2)
SODIUM: 142 mmol/L (ref 134–144)
TOTAL PROTEIN: 6.9 g/dL (ref 6.0–8.5)

## 2017-01-19 ENCOUNTER — Encounter: Payer: Self-pay | Admitting: Family

## 2017-01-19 ENCOUNTER — Other Ambulatory Visit: Payer: Self-pay | Admitting: Family

## 2017-01-19 MED ORDER — METFORMIN HCL ER 500 MG PO TB24: 500.0000 mg | ORAL_TABLET | Freq: Every day | ORAL | 1 refills | Status: DC

## 2017-01-19 MED ORDER — CALCIPOTRIENE-BETAMETH DIPROP 0.005-0.064 % EX OINT
TOPICAL_OINTMENT | Freq: Every day | CUTANEOUS | 0 refills | Status: DC
Start: 2017-01-19 — End: 2017-01-23

## 2017-01-20 ENCOUNTER — Other Ambulatory Visit: Payer: Self-pay | Admitting: *Deleted

## 2017-01-20 MED ORDER — NITROGLYCERIN 0.4 MG SL SUBL: 0.4000 mg | SUBLINGUAL_TABLET | SUBLINGUAL | 5 refills | Status: DC | PRN

## 2017-01-21 DIAGNOSIS — J384 Edema of larynx: Secondary | ICD-10-CM | POA: Diagnosis not present

## 2017-01-21 DIAGNOSIS — J9503 Malfunction of tracheostomy stoma: Secondary | ICD-10-CM | POA: Diagnosis not present

## 2017-01-22 DIAGNOSIS — S2242XD Multiple fractures of ribs, left side, subsequent encounter for fracture with routine healing: Secondary | ICD-10-CM | POA: Diagnosis not present

## 2017-01-22 DIAGNOSIS — G4733 Obstructive sleep apnea (adult) (pediatric): Secondary | ICD-10-CM | POA: Diagnosis not present

## 2017-01-22 DIAGNOSIS — J9503 Malfunction of tracheostomy stoma: Secondary | ICD-10-CM | POA: Diagnosis not present

## 2017-01-22 DIAGNOSIS — M4310 Spondylolisthesis, site unspecified: Secondary | ICD-10-CM | POA: Diagnosis not present

## 2017-01-22 DIAGNOSIS — S32009D Unspecified fracture of unspecified lumbar vertebra, subsequent encounter for fracture with routine healing: Secondary | ICD-10-CM | POA: Diagnosis not present

## 2017-01-22 DIAGNOSIS — S129XXD Fracture of neck, unspecified, subsequent encounter: Secondary | ICD-10-CM | POA: Diagnosis not present

## 2017-01-22 DIAGNOSIS — S12591D Other nondisplaced fracture of sixth cervical vertebra, subsequent encounter for fracture with routine healing: Secondary | ICD-10-CM | POA: Diagnosis not present

## 2017-01-22 DIAGNOSIS — I10 Essential (primary) hypertension: Secondary | ICD-10-CM | POA: Diagnosis not present

## 2017-01-22 DIAGNOSIS — E1169 Type 2 diabetes mellitus with other specified complication: Secondary | ICD-10-CM | POA: Diagnosis not present

## 2017-01-22 DIAGNOSIS — Z87891 Personal history of nicotine dependence: Secondary | ICD-10-CM | POA: Diagnosis not present

## 2017-01-22 DIAGNOSIS — I252 Old myocardial infarction: Secondary | ICD-10-CM | POA: Diagnosis not present

## 2017-01-22 DIAGNOSIS — J410 Simple chronic bronchitis: Secondary | ICD-10-CM | POA: Diagnosis not present

## 2017-01-23 ENCOUNTER — Other Ambulatory Visit: Payer: Self-pay | Admitting: Family

## 2017-01-23 DIAGNOSIS — G4700 Insomnia, unspecified: Secondary | ICD-10-CM

## 2017-01-23 DIAGNOSIS — I10 Essential (primary) hypertension: Secondary | ICD-10-CM

## 2017-01-23 DIAGNOSIS — E785 Hyperlipidemia, unspecified: Secondary | ICD-10-CM

## 2017-01-23 DIAGNOSIS — K219 Gastro-esophageal reflux disease without esophagitis: Secondary | ICD-10-CM

## 2017-01-23 MED ORDER — METFORMIN HCL ER 500 MG PO TB24: 500.0000 mg | ORAL_TABLET | Freq: Every day | ORAL | 1 refills | Status: DC

## 2017-01-23 MED ORDER — PANTOPRAZOLE SODIUM 40 MG PO TBEC: 40.0000 mg | DELAYED_RELEASE_TABLET | Freq: Every day | ORAL | 3 refills | Status: DC

## 2017-01-23 MED ORDER — NITROGLYCERIN 0.4 MG SL SUBL: 0.4000 mg | SUBLINGUAL_TABLET | SUBLINGUAL | 5 refills | Status: DC | PRN

## 2017-01-23 MED ORDER — CALCIPOTRIENE-BETAMETH DIPROP 0.005-0.064 % EX OINT: TOPICAL_OINTMENT | Freq: Every day | CUTANEOUS | 0 refills | Status: DC

## 2017-01-23 MED ORDER — METOPROLOL TARTRATE 25 MG PO TABS
25.0000 mg | ORAL_TABLET | Freq: Two times a day (BID) | ORAL | 1 refills | Status: DC
Start: 2017-01-23 — End: 2017-03-31

## 2017-01-23 MED ORDER — HYDRALAZINE HCL 10 MG PO TABS: 10.0000 mg | ORAL_TABLET | Freq: Three times a day (TID) | ORAL | 1 refills | Status: DC

## 2017-01-23 MED ORDER — ATORVASTATIN CALCIUM 40 MG PO TABS: 40.0000 mg | ORAL_TABLET | Freq: Every day | ORAL | 1 refills | Status: DC

## 2017-01-23 MED ORDER — MIRTAZAPINE 15 MG PO TABS: 15.0000 mg | ORAL_TABLET | Freq: Every day | ORAL | 1 refills | Status: DC

## 2017-01-23 MED ORDER — AMLODIPINE BESYLATE 10 MG PO TABS: 10.0000 mg | ORAL_TABLET | Freq: Every day | ORAL | 3 refills | Status: DC

## 2017-01-27 DIAGNOSIS — J9503 Malfunction of tracheostomy stoma: Secondary | ICD-10-CM | POA: Diagnosis not present

## 2017-01-27 DIAGNOSIS — S129XXD Fracture of neck, unspecified, subsequent encounter: Secondary | ICD-10-CM | POA: Diagnosis not present

## 2017-01-27 DIAGNOSIS — M4310 Spondylolisthesis, site unspecified: Secondary | ICD-10-CM | POA: Diagnosis not present

## 2017-01-27 DIAGNOSIS — S12591D Other nondisplaced fracture of sixth cervical vertebra, subsequent encounter for fracture with routine healing: Secondary | ICD-10-CM | POA: Diagnosis not present

## 2017-01-27 DIAGNOSIS — I252 Old myocardial infarction: Secondary | ICD-10-CM | POA: Diagnosis not present

## 2017-01-27 DIAGNOSIS — S32009D Unspecified fracture of unspecified lumbar vertebra, subsequent encounter for fracture with routine healing: Secondary | ICD-10-CM | POA: Diagnosis not present

## 2017-01-27 DIAGNOSIS — I10 Essential (primary) hypertension: Secondary | ICD-10-CM | POA: Diagnosis not present

## 2017-01-27 DIAGNOSIS — S2242XD Multiple fractures of ribs, left side, subsequent encounter for fracture with routine healing: Secondary | ICD-10-CM | POA: Diagnosis not present

## 2017-01-27 DIAGNOSIS — Z87891 Personal history of nicotine dependence: Secondary | ICD-10-CM | POA: Diagnosis not present

## 2017-01-27 DIAGNOSIS — E1169 Type 2 diabetes mellitus with other specified complication: Secondary | ICD-10-CM | POA: Diagnosis not present

## 2017-01-27 DIAGNOSIS — G4733 Obstructive sleep apnea (adult) (pediatric): Secondary | ICD-10-CM | POA: Diagnosis not present

## 2017-01-27 DIAGNOSIS — J410 Simple chronic bronchitis: Secondary | ICD-10-CM | POA: Diagnosis not present

## 2017-01-29 ENCOUNTER — Other Ambulatory Visit: Payer: Self-pay | Admitting: Family

## 2017-01-29 ENCOUNTER — Encounter: Payer: Self-pay | Admitting: Family

## 2017-01-29 DIAGNOSIS — S32009D Unspecified fracture of unspecified lumbar vertebra, subsequent encounter for fracture with routine healing: Secondary | ICD-10-CM | POA: Diagnosis not present

## 2017-01-29 DIAGNOSIS — I10 Essential (primary) hypertension: Secondary | ICD-10-CM | POA: Diagnosis not present

## 2017-01-29 DIAGNOSIS — G4733 Obstructive sleep apnea (adult) (pediatric): Secondary | ICD-10-CM | POA: Diagnosis not present

## 2017-01-29 DIAGNOSIS — I252 Old myocardial infarction: Secondary | ICD-10-CM | POA: Diagnosis not present

## 2017-01-29 DIAGNOSIS — S129XXD Fracture of neck, unspecified, subsequent encounter: Secondary | ICD-10-CM | POA: Diagnosis not present

## 2017-01-29 DIAGNOSIS — J9503 Malfunction of tracheostomy stoma: Secondary | ICD-10-CM | POA: Diagnosis not present

## 2017-01-29 DIAGNOSIS — J410 Simple chronic bronchitis: Secondary | ICD-10-CM | POA: Diagnosis not present

## 2017-01-29 DIAGNOSIS — S2242XD Multiple fractures of ribs, left side, subsequent encounter for fracture with routine healing: Secondary | ICD-10-CM | POA: Diagnosis not present

## 2017-01-29 DIAGNOSIS — M4310 Spondylolisthesis, site unspecified: Secondary | ICD-10-CM | POA: Diagnosis not present

## 2017-01-29 DIAGNOSIS — Z87891 Personal history of nicotine dependence: Secondary | ICD-10-CM | POA: Diagnosis not present

## 2017-01-29 DIAGNOSIS — S12591D Other nondisplaced fracture of sixth cervical vertebra, subsequent encounter for fracture with routine healing: Secondary | ICD-10-CM | POA: Diagnosis not present

## 2017-01-29 DIAGNOSIS — E1169 Type 2 diabetes mellitus with other specified complication: Secondary | ICD-10-CM | POA: Diagnosis not present

## 2017-01-29 MED ORDER — TRIAMCINOLONE ACETONIDE 0.5 % EX OINT: 1.0000 "application " | TOPICAL_OINTMENT | Freq: Two times a day (BID) | CUTANEOUS | 0 refills | Status: DC

## 2017-01-30 DIAGNOSIS — J410 Simple chronic bronchitis: Secondary | ICD-10-CM | POA: Diagnosis not present

## 2017-01-30 DIAGNOSIS — Z87891 Personal history of nicotine dependence: Secondary | ICD-10-CM | POA: Diagnosis not present

## 2017-01-30 DIAGNOSIS — S32009D Unspecified fracture of unspecified lumbar vertebra, subsequent encounter for fracture with routine healing: Secondary | ICD-10-CM | POA: Diagnosis not present

## 2017-01-30 DIAGNOSIS — G4733 Obstructive sleep apnea (adult) (pediatric): Secondary | ICD-10-CM | POA: Diagnosis not present

## 2017-01-30 DIAGNOSIS — S2242XD Multiple fractures of ribs, left side, subsequent encounter for fracture with routine healing: Secondary | ICD-10-CM | POA: Diagnosis not present

## 2017-01-30 DIAGNOSIS — M4310 Spondylolisthesis, site unspecified: Secondary | ICD-10-CM | POA: Diagnosis not present

## 2017-01-30 DIAGNOSIS — S12591D Other nondisplaced fracture of sixth cervical vertebra, subsequent encounter for fracture with routine healing: Secondary | ICD-10-CM | POA: Diagnosis not present

## 2017-01-30 DIAGNOSIS — E1169 Type 2 diabetes mellitus with other specified complication: Secondary | ICD-10-CM | POA: Diagnosis not present

## 2017-01-30 DIAGNOSIS — I252 Old myocardial infarction: Secondary | ICD-10-CM | POA: Diagnosis not present

## 2017-01-30 DIAGNOSIS — J9503 Malfunction of tracheostomy stoma: Secondary | ICD-10-CM | POA: Diagnosis not present

## 2017-01-30 DIAGNOSIS — S129XXD Fracture of neck, unspecified, subsequent encounter: Secondary | ICD-10-CM | POA: Diagnosis not present

## 2017-01-30 DIAGNOSIS — I10 Essential (primary) hypertension: Secondary | ICD-10-CM | POA: Diagnosis not present

## 2017-02-02 ENCOUNTER — Telehealth: Payer: Self-pay

## 2017-02-02 NOTE — Telephone Encounter (Signed)
Getting a prior auth for Calcipotriene-Betameth-Diprop  Do not see on list of meds or a diagnosis for it ????

## 2017-02-03 DIAGNOSIS — S32009D Unspecified fracture of unspecified lumbar vertebra, subsequent encounter for fracture with routine healing: Secondary | ICD-10-CM | POA: Diagnosis not present

## 2017-02-03 DIAGNOSIS — M4310 Spondylolisthesis, site unspecified: Secondary | ICD-10-CM | POA: Diagnosis not present

## 2017-02-03 DIAGNOSIS — J9503 Malfunction of tracheostomy stoma: Secondary | ICD-10-CM | POA: Diagnosis not present

## 2017-02-03 DIAGNOSIS — I10 Essential (primary) hypertension: Secondary | ICD-10-CM | POA: Diagnosis not present

## 2017-02-03 DIAGNOSIS — E1169 Type 2 diabetes mellitus with other specified complication: Secondary | ICD-10-CM | POA: Diagnosis not present

## 2017-02-03 DIAGNOSIS — G4733 Obstructive sleep apnea (adult) (pediatric): Secondary | ICD-10-CM | POA: Diagnosis not present

## 2017-02-03 DIAGNOSIS — S129XXD Fracture of neck, unspecified, subsequent encounter: Secondary | ICD-10-CM | POA: Diagnosis not present

## 2017-02-03 DIAGNOSIS — I252 Old myocardial infarction: Secondary | ICD-10-CM | POA: Diagnosis not present

## 2017-02-03 DIAGNOSIS — J410 Simple chronic bronchitis: Secondary | ICD-10-CM | POA: Diagnosis not present

## 2017-02-03 DIAGNOSIS — S2242XD Multiple fractures of ribs, left side, subsequent encounter for fracture with routine healing: Secondary | ICD-10-CM | POA: Diagnosis not present

## 2017-02-03 DIAGNOSIS — Z87891 Personal history of nicotine dependence: Secondary | ICD-10-CM | POA: Diagnosis not present

## 2017-02-03 DIAGNOSIS — S12591D Other nondisplaced fracture of sixth cervical vertebra, subsequent encounter for fracture with routine healing: Secondary | ICD-10-CM | POA: Diagnosis not present

## 2017-02-03 NOTE — Telephone Encounter (Signed)
I changed this medication to kenalog cream, because daughter-in-law called stating the other was not covered.

## 2017-02-04 DIAGNOSIS — S129XXD Fracture of neck, unspecified, subsequent encounter: Secondary | ICD-10-CM | POA: Diagnosis not present

## 2017-02-04 DIAGNOSIS — J410 Simple chronic bronchitis: Secondary | ICD-10-CM | POA: Diagnosis not present

## 2017-02-04 DIAGNOSIS — I252 Old myocardial infarction: Secondary | ICD-10-CM | POA: Diagnosis not present

## 2017-02-04 DIAGNOSIS — J9503 Malfunction of tracheostomy stoma: Secondary | ICD-10-CM | POA: Diagnosis not present

## 2017-02-04 DIAGNOSIS — Z87891 Personal history of nicotine dependence: Secondary | ICD-10-CM | POA: Diagnosis not present

## 2017-02-04 DIAGNOSIS — S32009D Unspecified fracture of unspecified lumbar vertebra, subsequent encounter for fracture with routine healing: Secondary | ICD-10-CM | POA: Diagnosis not present

## 2017-02-04 DIAGNOSIS — G4733 Obstructive sleep apnea (adult) (pediatric): Secondary | ICD-10-CM | POA: Diagnosis not present

## 2017-02-04 DIAGNOSIS — S12591D Other nondisplaced fracture of sixth cervical vertebra, subsequent encounter for fracture with routine healing: Secondary | ICD-10-CM | POA: Diagnosis not present

## 2017-02-04 DIAGNOSIS — S2242XD Multiple fractures of ribs, left side, subsequent encounter for fracture with routine healing: Secondary | ICD-10-CM | POA: Diagnosis not present

## 2017-02-04 DIAGNOSIS — I10 Essential (primary) hypertension: Secondary | ICD-10-CM | POA: Diagnosis not present

## 2017-02-04 DIAGNOSIS — E1169 Type 2 diabetes mellitus with other specified complication: Secondary | ICD-10-CM | POA: Diagnosis not present

## 2017-02-04 DIAGNOSIS — M4310 Spondylolisthesis, site unspecified: Secondary | ICD-10-CM | POA: Diagnosis not present

## 2017-02-05 ENCOUNTER — Telehealth: Payer: Self-pay | Admitting: Family

## 2017-02-05 DIAGNOSIS — J410 Simple chronic bronchitis: Secondary | ICD-10-CM | POA: Diagnosis not present

## 2017-02-05 DIAGNOSIS — M4310 Spondylolisthesis, site unspecified: Secondary | ICD-10-CM | POA: Diagnosis not present

## 2017-02-05 DIAGNOSIS — S32009D Unspecified fracture of unspecified lumbar vertebra, subsequent encounter for fracture with routine healing: Secondary | ICD-10-CM | POA: Diagnosis not present

## 2017-02-05 DIAGNOSIS — J9503 Malfunction of tracheostomy stoma: Secondary | ICD-10-CM | POA: Diagnosis not present

## 2017-02-05 DIAGNOSIS — G4733 Obstructive sleep apnea (adult) (pediatric): Secondary | ICD-10-CM | POA: Diagnosis not present

## 2017-02-05 DIAGNOSIS — Z87891 Personal history of nicotine dependence: Secondary | ICD-10-CM | POA: Diagnosis not present

## 2017-02-05 DIAGNOSIS — S2242XD Multiple fractures of ribs, left side, subsequent encounter for fracture with routine healing: Secondary | ICD-10-CM | POA: Diagnosis not present

## 2017-02-05 DIAGNOSIS — S12591D Other nondisplaced fracture of sixth cervical vertebra, subsequent encounter for fracture with routine healing: Secondary | ICD-10-CM | POA: Diagnosis not present

## 2017-02-05 DIAGNOSIS — E1169 Type 2 diabetes mellitus with other specified complication: Secondary | ICD-10-CM | POA: Diagnosis not present

## 2017-02-05 DIAGNOSIS — I252 Old myocardial infarction: Secondary | ICD-10-CM | POA: Diagnosis not present

## 2017-02-05 DIAGNOSIS — I10 Essential (primary) hypertension: Secondary | ICD-10-CM | POA: Diagnosis not present

## 2017-02-05 DIAGNOSIS — S129XXD Fracture of neck, unspecified, subsequent encounter: Secondary | ICD-10-CM | POA: Diagnosis not present

## 2017-02-06 NOTE — Telephone Encounter (Signed)
ok 

## 2017-02-11 ENCOUNTER — Encounter: Payer: Self-pay | Admitting: Family

## 2017-02-11 ENCOUNTER — Other Ambulatory Visit: Payer: Self-pay | Admitting: Family

## 2017-02-17 ENCOUNTER — Telehealth: Payer: Self-pay | Admitting: Family

## 2017-02-17 DIAGNOSIS — F329 Major depressive disorder, single episode, unspecified: Secondary | ICD-10-CM

## 2017-02-17 DIAGNOSIS — F32A Depression, unspecified: Secondary | ICD-10-CM

## 2017-02-17 DIAGNOSIS — F419 Anxiety disorder, unspecified: Principal | ICD-10-CM

## 2017-02-18 MED ORDER — ALPRAZOLAM 0.5 MG PO TABS: 0.5000 mg | ORAL_TABLET | Freq: Three times a day (TID) | ORAL | 3 refills | Status: DC | PRN

## 2017-02-18 NOTE — Telephone Encounter (Signed)
Please call in new rx

## 2017-02-18 NOTE — Telephone Encounter (Signed)
Rx phoned into [facility] and patient aware.

## 2017-03-13 ENCOUNTER — Ambulatory Visit: Payer: Medicare Other | Admitting: Family

## 2017-03-26 ENCOUNTER — Other Ambulatory Visit: Payer: Self-pay | Admitting: Family

## 2017-03-28 ENCOUNTER — Encounter: Payer: Self-pay | Admitting: Family

## 2017-03-31 ENCOUNTER — Other Ambulatory Visit: Payer: Self-pay | Admitting: Family

## 2017-03-31 MED ORDER — AMLODIPINE BESYLATE 5 MG PO TABS: 5.0000 mg | ORAL_TABLET | Freq: Every day | ORAL | 3 refills | Status: DC

## 2017-03-31 MED ORDER — METOPROLOL TARTRATE 50 MG PO TABS: 50.0000 mg | ORAL_TABLET | Freq: Two times a day (BID) | ORAL | 3 refills | Status: DC

## 2017-04-07 ENCOUNTER — Encounter: Payer: Self-pay | Admitting: *Deleted

## 2017-04-23 ENCOUNTER — Ambulatory Visit: Payer: Medicare Other | Admitting: Family

## 2017-04-24 ENCOUNTER — Ambulatory Visit: Payer: Medicare Other | Admitting: Family

## 2017-04-27 ENCOUNTER — Ambulatory Visit: Payer: Medicare Other | Admitting: Family

## 2017-05-05 ENCOUNTER — Ambulatory Visit (INDEPENDENT_AMBULATORY_CARE_PROVIDER_SITE_OTHER): Payer: Medicare Other | Admitting: Family

## 2017-05-05 ENCOUNTER — Encounter: Payer: Self-pay | Admitting: Family

## 2017-05-05 ENCOUNTER — Ambulatory Visit (INDEPENDENT_AMBULATORY_CARE_PROVIDER_SITE_OTHER): Payer: Medicare Other

## 2017-05-05 VITALS — BP 199/88 | HR 84 | Temp 97.4°F | Ht 69.0 in | Wt 176.4 lb

## 2017-05-05 DIAGNOSIS — Z720 Tobacco use: Secondary | ICD-10-CM

## 2017-05-05 DIAGNOSIS — F331 Major depressive disorder, recurrent, moderate: Secondary | ICD-10-CM

## 2017-05-05 DIAGNOSIS — F419 Anxiety disorder, unspecified: Secondary | ICD-10-CM

## 2017-05-05 DIAGNOSIS — R231 Pallor: Secondary | ICD-10-CM

## 2017-05-05 DIAGNOSIS — I2510 Atherosclerotic heart disease of native coronary artery without angina pectoris: Secondary | ICD-10-CM | POA: Diagnosis not present

## 2017-05-05 DIAGNOSIS — E1169 Type 2 diabetes mellitus with other specified complication: Secondary | ICD-10-CM

## 2017-05-05 DIAGNOSIS — J439 Emphysema, unspecified: Secondary | ICD-10-CM

## 2017-05-05 DIAGNOSIS — I10 Essential (primary) hypertension: Secondary | ICD-10-CM

## 2017-05-05 DIAGNOSIS — R079 Chest pain, unspecified: Secondary | ICD-10-CM | POA: Diagnosis not present

## 2017-05-05 DIAGNOSIS — I152 Hypertension secondary to endocrine disorders: Secondary | ICD-10-CM

## 2017-05-05 DIAGNOSIS — K219 Gastro-esophageal reflux disease without esophagitis: Secondary | ICD-10-CM

## 2017-05-05 DIAGNOSIS — E1142 Type 2 diabetes mellitus with diabetic polyneuropathy: Secondary | ICD-10-CM

## 2017-05-05 DIAGNOSIS — E1159 Type 2 diabetes mellitus with other circulatory complications: Secondary | ICD-10-CM

## 2017-05-05 DIAGNOSIS — G4700 Insomnia, unspecified: Secondary | ICD-10-CM

## 2017-05-05 DIAGNOSIS — F329 Major depressive disorder, single episode, unspecified: Secondary | ICD-10-CM

## 2017-05-05 DIAGNOSIS — R062 Wheezing: Secondary | ICD-10-CM

## 2017-05-05 DIAGNOSIS — R6889 Other general symptoms and signs: Secondary | ICD-10-CM | POA: Diagnosis not present

## 2017-05-05 DIAGNOSIS — E785 Hyperlipidemia, unspecified: Secondary | ICD-10-CM

## 2017-05-05 DIAGNOSIS — F32A Depression, unspecified: Secondary | ICD-10-CM

## 2017-05-05 LAB — BAYER DCA HB A1C WAIVED: HB A1C: 6.1 % (ref <7.0)

## 2017-05-05 MED ORDER — HYDRALAZINE HCL 10 MG PO TABS: 10.0000 mg | ORAL_TABLET | Freq: Three times a day (TID) | ORAL | 1 refills | Status: DC

## 2017-05-05 MED ORDER — LOSARTAN POTASSIUM 50 MG PO TABS: 50.0000 mg | ORAL_TABLET | Freq: Every day | ORAL | 3 refills | Status: DC

## 2017-05-05 MED ORDER — AMLODIPINE BESYLATE 5 MG PO TABS: 5.0000 mg | ORAL_TABLET | Freq: Every day | ORAL | 3 refills | Status: DC

## 2017-05-05 MED ORDER — METOPROLOL TARTRATE 50 MG PO TABS: 50.0000 mg | ORAL_TABLET | Freq: Two times a day (BID) | ORAL | 3 refills | Status: DC

## 2017-05-05 MED ORDER — MIRTAZAPINE 15 MG PO TABS
15.0000 mg | ORAL_TABLET | Freq: Every day | ORAL | 1 refills | Status: DC
Start: 2017-05-05 — End: 2017-11-19

## 2017-05-05 MED ORDER — ONETOUCH ULTRASOFT LANCETS MISC
12 refills | Status: DC
Start: 2017-05-05 — End: 2021-07-05

## 2017-05-05 MED ORDER — PANTOPRAZOLE SODIUM 40 MG PO TBEC: 40.0000 mg | DELAYED_RELEASE_TABLET | Freq: Every day | ORAL | 1 refills | Status: DC

## 2017-05-05 MED ORDER — PREGABALIN 100 MG PO CAPS: 100.0000 mg | ORAL_CAPSULE | Freq: Two times a day (BID) | ORAL | 3 refills | Status: DC

## 2017-05-05 MED ORDER — ALPRAZOLAM 0.5 MG PO TABS: 0.5000 mg | ORAL_TABLET | Freq: Three times a day (TID) | ORAL | 3 refills | Status: DC | PRN

## 2017-05-05 MED ORDER — LOSARTAN POTASSIUM 100 MG PO TABS: 100.0000 mg | ORAL_TABLET | Freq: Every day | ORAL | 3 refills | Status: DC

## 2017-05-05 MED ORDER — ATORVASTATIN CALCIUM 40 MG PO TABS: 40.0000 mg | ORAL_TABLET | Freq: Every day | ORAL | 1 refills | Status: DC

## 2017-05-05 MED ORDER — METFORMIN HCL ER 500 MG PO TB24: 500.0000 mg | ORAL_TABLET | Freq: Every day | ORAL | 1 refills | Status: DC

## 2017-05-05 MED ORDER — FLUTICASONE FUROATE-VILANTEROL 100-25 MCG/INH IN AEPB: 1.0000 | INHALATION_SPRAY | Freq: Every day | RESPIRATORY_TRACT | 2 refills | Status: DC

## 2017-05-05 NOTE — Patient Instructions (Signed)

## 2017-05-05 NOTE — Progress Notes (Signed)
Subjective:    Patient ID: Ronald [name], male    DOB: May 10, 1946, 71 y.o.   MRN: [id number]  Pt presents to the office today for chronic follow up. PT's ex-wife is present and states patient does not take any of his medications, and eats what he wants. Pt is smoking 2 packs a day.   Pt has seen Cardiologists, but has not seen one recently.  Diabetes  He presents for his follow-up diabetic visit. He has type 2 diabetes mellitus. His disease course has been worsening. Hypoglycemia symptoms include nervousness/anxiousness. Associated symptoms include foot paresthesias. Pertinent negatives for diabetes include no blurred vision and no visual change. Symptoms are worsening. Diabetic complications include heart disease and peripheral neuropathy. Risk factors for coronary artery disease include diabetes mellitus, dyslipidemia, obesity, male sex, hypertension, sedentary lifestyle and tobacco exposure. He is following a generally unhealthy diet. His breakfast blood glucose range is generally 140-180 mg/dl.  Hyperlipidemia  This is a chronic problem. The current episode started more than 1 year ago. The problem is uncontrolled. Recent lipid tests were reviewed and are high. Associated symptoms include shortness of breath. Current antihyperlipidemic treatment includes statins. The current treatment provides moderate improvement of lipids. Risk factors for coronary artery disease include diabetes mellitus, dyslipidemia, male sex and hypertension.  Gastroesophageal Reflux  He reports no belching, no coughing or no heartburn. This is a chronic problem. The current episode started more than 1 year ago. The problem occurs occasionally. He has tried a PPI for the symptoms. The treatment provided moderate relief.  Anxiety  Presents for follow-up visit. Symptoms include depressed mood, excessive worry, irritability, nervous/anxious behavior, restlessness and shortness of breath. Symptoms occur most days. The severity  of symptoms is moderate.    Depression         This is a chronic problem.  The current episode started more than 1 year ago.   The onset quality is gradual.   The problem occurs intermittently.  The problem has been waxing and waning since onset.  Associated symptoms include irritable, restlessness, decreased interest and sad.  Associated symptoms include no helplessness and no hopelessness.  Past medical history includes anxiety.   Hypertension  This is a chronic problem. The current episode started more than 1 year ago. The problem has been waxing and waning since onset. The problem is uncontrolled. Associated symptoms include anxiety, malaise/fatigue, peripheral edema and shortness of breath. Pertinent negatives include no blurred vision. Risk factors for coronary artery disease include diabetes mellitus, dyslipidemia, obesity, male gender and smoking/tobacco exposure. The current treatment provides moderate improvement. Hypertensive end-organ damage includes CAD/MI.  Diabetic Neuropathy  PT taking Lyrica 50 mg BID. States he has constant pain of burning and aching in bilateral feet of 8 out 10.  COPD  PT takes albuterol as needed. Smoking 2 packs.    Review of Systems  Constitutional: Positive for irritability and malaise/fatigue.  Eyes: Negative for blurred vision.  Respiratory: Positive for shortness of breath. Negative for cough.   Gastrointestinal: Negative for heartburn.  Psychiatric/Behavioral: Positive for depression. The patient is nervous/anxious.   All other systems reviewed and are negative.  Breast Cancer-relatedfamily history is not on file.  Social History   Socioeconomic History  . Marital status: Divorced    Spouse name: Not on file  . Number of children: 2  . Years of education: Not on file  . Highest education level: Not on file  Social Needs  . Financial resource strain: Not on file  .  Food insecurity - worry: Not on file  . Food insecurity - inability: Not on  file  . Transportation needs - medical: Not on file  . Transportation needs - non-medical: Not on file  Occupational History  . Occupation: Retired  Tobacco Use  . Smoking status: Current Every Day Smoker    Packs/day: 1.50    Years: 48.00    Pack years: 72.00    Types: Cigarettes    Start date: 02/12/1966    Last attempt to quit: 07/25/2016    Years since quitting: 0.7  . Smokeless tobacco: Never Used  Substance and Sexual Activity  . Alcohol use: Yes    Alcohol/week: 0.0 oz    Comment: "drank some when I was 18"  . Drug use: No  . Sexual activity: No  Other Topics Concern  . Not on file  Social History Narrative  . Not on file       Objective:   Physical Exam  Constitutional: He is oriented to person, place, and time. He appears well-developed and well-nourished. He is irritable. No distress.  HENT:  Head: Normocephalic.  Right Ear: External ear normal.  Left Ear: External ear normal.  Mouth/Throat: Oropharynx is clear and moist.  Eyes: Pupils are equal, round, and reactive to light. Right eye exhibits no discharge. Left eye exhibits no discharge.  Neck: Normal range of motion. Neck supple. No thyromegaly present.  Cardiovascular: Normal rate, regular rhythm, normal heart sounds and intact distal pulses.  No murmur [name]. Pulmonary/Chest: Effort normal. No respiratory distress. He has decreased breath sounds. He has wheezes.  Abdominal: Soft. Bowel sounds are normal. He exhibits no distension. There is no tenderness.  Musculoskeletal: Normal range of motion. He exhibits no edema or tenderness.  Neurological: He is alert and oriented to person, place, and time.  Skin: Skin is warm and dry. No rash noted. No erythema. There is pallor.  Psychiatric: He has a normal mood and affect. His behavior is normal. Judgment and thought content normal.  Vitals reviewed.   BP (!) 199/88   Pulse 84   Temp (!) 97.4 F (36.3 C) (Oral)   Ht 5' 9"  (1.753 m)   Wt 176 lb 6.4 oz (80 kg)    BMI 26.05 kg/m      Assessment & Plan:  1. Hypertension associated with diabetes ([place]) Will add Losartan 50 mg and restart Norvasc 5 mg -Daily blood pressure log given with instructions on how to fill out and told to bring to next visit -Dash diet information given -Exercise encouraged - Stress Management  -Continue current meds -RTO in 1 week - CMP14+EGFR - losartan (COZAAR) 100 MG tablet; Take 1 tablet (100 mg total) by mouth daily.  Dispense: 90 tablet; Refill: 3  2. Pulmonary emphysema, unspecified emphysema type ([place]) Will start Breo today Smoking cessation discussed - CMP14+EGFR - fluticasone furoate-vilanterol (BREO ELLIPTA) 100-25 MCG/INH AEPB; Inhale 1 puff into the lungs daily.  Dispense: 1 each; Refill: 2 - DG Chest 2 View; Future  3. Gastroesophageal reflux disease, esophagitis presence not specified - CMP14+EGFR - pantoprazole (PROTONIX) 40 MG tablet; Take 1 tablet (40 mg total) by mouth daily.  Dispense: 90 tablet; Refill: 1  4. CAD in native artery - CMP14+EGFR  5. Diabetic polyneuropathy associated with type 2 diabetes mellitus (HCC) Lyrica increased to 100 mg BID from 50 mg BID - CMP14+EGFR - pregabalin (LYRICA) 100 MG capsule; Take 1 capsule (100 mg total) by mouth 2 (two) times daily.  Dispense: 60 capsule;  Refill: 3  6. Chest pain at rest - CMP14+EGFR  7. Tobacco abuse Smoking cessation discussed - CMP14+EGFR - DG Chest 2 View; Future  8. Anxiety - CMP14+EGFR  9. Type 2 diabetes mellitus with diabetic polyneuropathy, without long-term current use of insulin (HCC) - CMP14+EGFR - Bayer DCA Hb A1c Waived - Microalbumin / creatinine urine ratio  10. Moderate episode of recurrent major depressive disorder (HCC) - CMP14+EGFR  11. Wheezing - CMP14+EGFR - DG Chest 2 View; Future  12. Pale - CMP14+EGFR - Anemia Profile B  13. Hyperlipidemia associated with type 2 diabetes mellitus (HCC) - CMP14+EGFR - Lipid panel  14. Insomnia,  unspecified type - mirtazapine (REMERON) 15 MG tablet; Take 1 tablet (15 mg total) by mouth daily.  Dispense: 90 tablet; Refill: 1  15. Anxiety and depression - ALPRAZolam (XANAX) 0.5 MG tablet; Take 1 tablet (0.5 mg total) by mouth 3 (three) times daily as needed for anxiety.  Dispense: 90 tablet; Refill: 3   Continue all meds Labs pending Health Maintenance reviewed Diet and exercise encouraged RTO 1 week to recheck HTN  [name], FNP

## 2017-05-06 LAB — CMP14+EGFR
ALBUMIN: 4.2 g/dL (ref 3.6–4.8)
ALK PHOS: 136 IU/L — AB (ref 39–117)
ALT: 8 IU/L (ref 0–44)
AST: 11 IU/L (ref 0–40)
Albumin/Globulin Ratio: 1.6 (ref 1.2–2.2)
BILIRUBIN TOTAL: 0.3 mg/dL (ref 0.0–1.2)
BUN / CREAT RATIO: 14 (ref 10–24)
BUN: 13 mg/dL (ref 8–27)
CHLORIDE: 100 mmol/L (ref 96–106)
CO2: 28 mmol/L (ref 20–29)
CREATININE: 0.91 mg/dL (ref 0.76–1.27)
Calcium: 9.2 mg/dL (ref 8.6–10.2)
GFR calc Af Amer: 99 mL/min/{1.73_m2} (ref >59)
GFR calc non Af Amer: 86 mL/min/{1.73_m2} (ref >59)
GLUCOSE: 121 mg/dL — AB (ref 65–99)
Globulin, Total: 2.7 g/dL (ref 1.5–4.5)
Potassium: 4.9 mmol/L (ref 3.5–5.2)
SODIUM: 141 mmol/L (ref 134–144)
Total Protein: 6.9 g/dL (ref 6.0–8.5)

## 2017-05-06 LAB — ANEMIA PROFILE B
BASOS: 0 %
Basophils Absolute: 0 10*3/uL (ref 0.0–0.2)
EOS (ABSOLUTE): 0.2 10*3/uL (ref 0.0–0.4)
EOS: 2 %
Ferritin: 15 ng/mL — ABNORMAL LOW (ref 30–400)
Folate: 18.4 ng/mL (ref >3.0)
HEMATOCRIT: 42 % (ref 37.5–51.0)
HEMOGLOBIN: 14.1 g/dL (ref 13.0–17.7)
IRON SATURATION: 12 % — AB (ref 15–55)
Immature Grans (Abs): 0 10*3/uL (ref 0.0–0.1)
Immature Granulocytes: 0 %
Iron: 44 ug/dL (ref 38–169)
LYMPHS ABS: 1.9 10*3/uL (ref 0.7–3.1)
Lymphs: 25 %
MCH: 26.6 pg (ref 26.6–33.0)
MCHC: 33.6 g/dL (ref 31.5–35.7)
MCV: 79 fL (ref 79–97)
MONOS ABS: 0.8 10*3/uL (ref 0.1–0.9)
Monocytes: 10 %
NEUTROS ABS: 4.8 10*3/uL (ref 1.4–7.0)
Neutrophils: 63 %
Platelets: 339 10*3/uL (ref 150–379)
RBC: 5.3 x10E6/uL (ref 4.14–5.80)
RDW: 15 % (ref 12.3–15.4)
Retic Ct Pct: 1.5 % (ref 0.6–2.6)
Total Iron Binding Capacity: 356 ug/dL (ref 250–450)
UIBC: 312 ug/dL (ref 111–343)
VITAMIN B 12: 185 pg/mL — AB (ref 232–1245)
WBC: 7.7 10*3/uL (ref 3.4–10.8)

## 2017-05-06 LAB — LIPID PANEL
CHOLESTEROL TOTAL: 110 mg/dL (ref 100–199)
Chol/HDL Ratio: 3.4 ratio (ref 0.0–5.0)
HDL: 32 mg/dL — ABNORMAL LOW (ref >39)
LDL Calculated: 46 mg/dL (ref 0–99)
TRIGLYCERIDES: 162 mg/dL — AB (ref 0–149)
VLDL Cholesterol Cal: 32 mg/dL (ref 5–40)

## 2017-05-06 LAB — MICROALBUMIN / CREATININE URINE RATIO
CREATININE, UR: 52 mg/dL
MICROALBUM., U, RANDOM: 23.1 ug/mL
Microalb/Creat Ratio: 44.4 mg/g creat — ABNORMAL HIGH (ref 0.0–30.0)

## 2017-05-11 ENCOUNTER — Encounter: Payer: Self-pay | Admitting: *Deleted

## 2017-05-11 ENCOUNTER — Other Ambulatory Visit: Payer: Self-pay | Admitting: Family

## 2017-05-11 DIAGNOSIS — I700 Atherosclerosis of aorta: Secondary | ICD-10-CM | POA: Insufficient documentation

## 2017-05-12 ENCOUNTER — Ambulatory Visit: Payer: Medicare Other | Admitting: Family

## 2017-05-19 ENCOUNTER — Ambulatory Visit: Payer: Medicare Other | Admitting: Family

## 2017-05-21 ENCOUNTER — Ambulatory Visit: Payer: Medicare Other | Admitting: *Deleted

## 2017-05-27 ENCOUNTER — Ambulatory Visit: Payer: Medicare Other | Admitting: Family

## 2017-05-29 ENCOUNTER — Ambulatory Visit: Payer: Medicare Other | Admitting: Family

## 2017-06-16 ENCOUNTER — Ambulatory Visit: Payer: Medicare Other | Admitting: Family

## 2017-07-08 DIAGNOSIS — R69 Illness, unspecified: Secondary | ICD-10-CM | POA: Diagnosis not present

## 2017-10-07 ENCOUNTER — Other Ambulatory Visit: Payer: Self-pay | Admitting: Family

## 2017-10-07 DIAGNOSIS — F329 Major depressive disorder, single episode, unspecified: Secondary | ICD-10-CM

## 2017-10-07 DIAGNOSIS — F32A Depression, unspecified: Secondary | ICD-10-CM

## 2017-10-07 DIAGNOSIS — F419 Anxiety disorder, unspecified: Principal | ICD-10-CM

## 2017-10-07 NOTE — Telephone Encounter (Signed)
Last seen 05/05/17  [facility]

## 2017-11-10 ENCOUNTER — Ambulatory Visit: Payer: Medicare Other | Admitting: Family

## 2017-11-12 ENCOUNTER — Encounter: Payer: Self-pay | Admitting: Physician Assistant

## 2017-11-12 ENCOUNTER — Telehealth: Payer: Self-pay

## 2017-11-12 ENCOUNTER — Ambulatory Visit (INDEPENDENT_AMBULATORY_CARE_PROVIDER_SITE_OTHER): Payer: Medicare HMO | Admitting: Physician Assistant

## 2017-11-12 VITALS — BP 164/75 | HR 71 | Temp 97.5°F | Ht 69.0 in | Wt 178.5 lb

## 2017-11-12 DIAGNOSIS — F32A Depression, unspecified: Secondary | ICD-10-CM

## 2017-11-12 DIAGNOSIS — F419 Anxiety disorder, unspecified: Principal | ICD-10-CM

## 2017-11-12 DIAGNOSIS — L03012 Cellulitis of left finger: Secondary | ICD-10-CM

## 2017-11-12 DIAGNOSIS — F329 Major depressive disorder, single episode, unspecified: Secondary | ICD-10-CM

## 2017-11-12 MED ORDER — CEPHALEXIN 500 MG PO CAPS: 500.0000 mg | ORAL_CAPSULE | Freq: Two times a day (BID) | ORAL | 0 refills | Status: DC

## 2017-11-12 NOTE — Telephone Encounter (Signed)
Saw Ronald [name] today and he said he would send in my Xanax  Not done

## 2017-11-12 NOTE — Patient Instructions (Signed)

## 2017-11-12 NOTE — Progress Notes (Signed)
  Subjective:     Patient ID: Ronald [name], male   DOB: Jan 10, 1946, 72 y.o.   MRN: [id number]  HPI Pt with redness and swelling to the upper ext bilat Hx of same previously Using cream from a friend Hx of eczema  Review of Systems  Constitutional: Negative.   Skin: Positive for color change, rash and wound.       Objective:   Physical Exam Pt with erythema, edema and induration to the elbows bilat that extends to the mid forearm L>R + erythema, edema, and induration to the hands bilat  R>L Appears he had constriction to the L wrist but pt  denies + sores and excoriations to the same area No current drainage Good upper ext pulses FROM of the elbow and wrists    Assessment:     1. Cellulitis of finger of left hand        Plan:     Keflex rx Keep area moisturized with lotion Try to minimize scratching No restrictive clothing, watches etc F/U next week with regular provider

## 2017-11-12 NOTE — Telephone Encounter (Signed)
Forward to PCP.

## 2017-11-13 ENCOUNTER — Other Ambulatory Visit: Payer: Self-pay | Admitting: *Deleted

## 2017-11-13 ENCOUNTER — Encounter: Payer: Self-pay | Admitting: Family

## 2017-11-13 ENCOUNTER — Telehealth: Payer: Self-pay | Admitting: Family

## 2017-11-13 DIAGNOSIS — F329 Major depressive disorder, single episode, unspecified: Secondary | ICD-10-CM

## 2017-11-13 DIAGNOSIS — F32A Depression, unspecified: Secondary | ICD-10-CM

## 2017-11-13 DIAGNOSIS — F419 Anxiety disorder, unspecified: Principal | ICD-10-CM

## 2017-11-13 NOTE — Telephone Encounter (Signed)
Pt notified he would need office visit with PCP to get Xanax refill Pt offered appt for Monday Pt states he was seen yesterday Informed pt that refill needs to come from PCP Pt became very upset Pt states he will move his records and go somewhere else

## 2017-11-16 ENCOUNTER — Other Ambulatory Visit: Payer: Self-pay | Admitting: Family

## 2017-11-16 DIAGNOSIS — F419 Anxiety disorder, unspecified: Principal | ICD-10-CM

## 2017-11-16 DIAGNOSIS — F329 Major depressive disorder, single episode, unspecified: Secondary | ICD-10-CM

## 2017-11-16 DIAGNOSIS — F32A Depression, unspecified: Secondary | ICD-10-CM

## 2017-11-16 MED ORDER — ALPRAZOLAM 0.5 MG PO TABS: 0.5000 mg | ORAL_TABLET | Freq: Three times a day (TID) | ORAL | 0 refills | Status: DC | PRN

## 2017-11-16 NOTE — Telephone Encounter (Signed)
Attempted to contact patient - NVM 

## 2017-11-16 NOTE — Addendum Note (Signed)
Addended by: [name] A on: 11/16/2017 08:33 AM   Modules accepted: Orders

## 2017-11-16 NOTE — Telephone Encounter (Signed)
I sent in 30 days, but will NTBS for more refills. Saw Bill for an acute issue not follow up.

## 2017-11-18 ENCOUNTER — Other Ambulatory Visit: Payer: Self-pay | Admitting: Family

## 2017-11-18 DIAGNOSIS — F32A Depression, unspecified: Secondary | ICD-10-CM

## 2017-11-18 DIAGNOSIS — F329 Major depressive disorder, single episode, unspecified: Secondary | ICD-10-CM

## 2017-11-18 DIAGNOSIS — F419 Anxiety disorder, unspecified: Principal | ICD-10-CM

## 2017-11-18 NOTE — Telephone Encounter (Signed)
Patient aware and verbalizes understanding. 

## 2017-11-19 ENCOUNTER — Encounter: Payer: Self-pay | Admitting: Family

## 2017-11-19 ENCOUNTER — Ambulatory Visit (INDEPENDENT_AMBULATORY_CARE_PROVIDER_SITE_OTHER): Payer: Medicare HMO | Admitting: Family

## 2017-11-19 VITALS — BP 165/85 | HR 64 | Temp 97.1°F | Ht 69.0 in | Wt 176.0 lb

## 2017-11-19 DIAGNOSIS — F32A Depression, unspecified: Secondary | ICD-10-CM

## 2017-11-19 DIAGNOSIS — I10 Essential (primary) hypertension: Secondary | ICD-10-CM | POA: Diagnosis not present

## 2017-11-19 DIAGNOSIS — I700 Atherosclerosis of aorta: Secondary | ICD-10-CM

## 2017-11-19 DIAGNOSIS — F331 Major depressive disorder, recurrent, moderate: Secondary | ICD-10-CM

## 2017-11-19 DIAGNOSIS — E1159 Type 2 diabetes mellitus with other circulatory complications: Secondary | ICD-10-CM

## 2017-11-19 DIAGNOSIS — E785 Hyperlipidemia, unspecified: Secondary | ICD-10-CM

## 2017-11-19 DIAGNOSIS — L03114 Cellulitis of left upper limb: Secondary | ICD-10-CM | POA: Diagnosis not present

## 2017-11-19 DIAGNOSIS — E1142 Type 2 diabetes mellitus with diabetic polyneuropathy: Secondary | ICD-10-CM | POA: Diagnosis not present

## 2017-11-19 DIAGNOSIS — Z720 Tobacco use: Secondary | ICD-10-CM | POA: Diagnosis not present

## 2017-11-19 DIAGNOSIS — R69 Illness, unspecified: Secondary | ICD-10-CM | POA: Diagnosis not present

## 2017-11-19 DIAGNOSIS — J439 Emphysema, unspecified: Secondary | ICD-10-CM

## 2017-11-19 DIAGNOSIS — I152 Hypertension secondary to endocrine disorders: Secondary | ICD-10-CM

## 2017-11-19 DIAGNOSIS — F329 Major depressive disorder, single episode, unspecified: Secondary | ICD-10-CM | POA: Diagnosis not present

## 2017-11-19 DIAGNOSIS — I2510 Atherosclerotic heart disease of native coronary artery without angina pectoris: Secondary | ICD-10-CM

## 2017-11-19 DIAGNOSIS — F419 Anxiety disorder, unspecified: Secondary | ICD-10-CM

## 2017-11-19 DIAGNOSIS — Z23 Encounter for immunization: Secondary | ICD-10-CM | POA: Diagnosis not present

## 2017-11-19 LAB — BAYER DCA HB A1C WAIVED: HB A1C: 6.3 % (ref <7.0)

## 2017-11-19 MED ORDER — DOXYCYCLINE HYCLATE 100 MG PO TABS: 100.0000 mg | ORAL_TABLET | Freq: Two times a day (BID) | ORAL | 0 refills | Status: DC

## 2017-11-19 MED ORDER — METFORMIN HCL ER 500 MG PO TB24: 500.0000 mg | ORAL_TABLET | Freq: Every day | ORAL | 1 refills | Status: DC

## 2017-11-19 MED ORDER — AMLODIPINE BESYLATE 5 MG PO TABS: 5.0000 mg | ORAL_TABLET | Freq: Every day | ORAL | 3 refills | Status: DC

## 2017-11-19 MED ORDER — ALPRAZOLAM 0.5 MG PO TABS: 0.5000 mg | ORAL_TABLET | Freq: Three times a day (TID) | ORAL | 0 refills | Status: DC | PRN

## 2017-11-19 MED ORDER — METOPROLOL TARTRATE 50 MG PO TABS: 50.0000 mg | ORAL_TABLET | Freq: Two times a day (BID) | ORAL | 3 refills | Status: DC

## 2017-11-19 MED ORDER — ATORVASTATIN CALCIUM 40 MG PO TABS: 40.0000 mg | ORAL_TABLET | Freq: Every day | ORAL | 1 refills | Status: DC

## 2017-11-19 MED ORDER — FLUTICASONE FUROATE-VILANTEROL 100-25 MCG/INH IN AEPB: 1.0000 | INHALATION_SPRAY | Freq: Every day | RESPIRATORY_TRACT | 2 refills | Status: DC

## 2017-11-19 NOTE — Addendum Note (Signed)
Addended by: Shelbie Ammons on: 11/19/2017 04:06 PM   Modules accepted: Orders

## 2017-11-19 NOTE — Patient Instructions (Signed)

## 2017-11-19 NOTE — Progress Notes (Signed)
Subjective:    Patient ID: Ronald [name], male    DOB: 10/04/1945, 72 y.o.   MRN: [id number]  Chief Complaint  Patient presents with  . recheck cellulitis of finger  . Diabetes   PT presents to the office today for chronic follow up and to recheck cellulitis of left hand. Pt was started on Keflex  And states his hand is slightly better, but still swollen and erythemas.   PT states he has been out of his medications for the last 3-4 months except "two of them". He states he thinks one that he is taking is for BP.  Diabetes  He presents for his follow-up diabetic visit. He has type 2 diabetes mellitus. Hypoglycemia symptoms include nervousness/anxiousness. Associated symptoms include blurred vision and foot paresthesias. Symptoms are worsening. Diabetic complications include a CVA, heart disease and peripheral neuropathy. Risk factors for coronary artery disease include diabetes mellitus, dyslipidemia, family history, male sex and hypertension. (Does not checks BS at home ) Eye exam is not current.  Hypertension  This is a chronic problem. The current episode started more than 1 year ago. The problem has been waxing and waning since onset. The problem is uncontrolled. Associated symptoms include anxiety, blurred vision, malaise/fatigue and shortness of breath. Pertinent negatives include no peripheral edema. Risk factors for coronary artery disease include dyslipidemia, diabetes mellitus, obesity, male gender, smoking/tobacco exposure and sedentary lifestyle. The current treatment provides moderate improvement. Hypertensive end-organ damage includes CAD/MI and CVA.  Hyperlipidemia  This is a chronic problem. The current episode started more than 1 year ago. The problem is controlled. Recent lipid tests were reviewed and are normal. Associated symptoms include shortness of breath. Current antihyperlipidemic treatment includes statins. The current treatment provides moderate improvement of lipids.    Gastroesophageal Reflux  He complains of heartburn and wheezing. He reports no belching or no coughing. This is a chronic problem. The current episode started more than 1 year ago. The problem has been waxing and waning. The symptoms are aggravated by medications and smoking. He has tried a PPI for the symptoms. The treatment provided mild relief.  Anxiety  Presents for follow-up visit. Symptoms include excessive worry, irritability, nervous/anxious behavior and shortness of breath. Symptoms occur occasionally. The severity of symptoms is moderate. The quality of sleep is good.    COPD PT states he continues to smoke 1 1/2 packs a day. States he does not have an inhaler, because he "ran out".     Review of Systems  Constitutional: Positive for irritability and malaise/fatigue.  Eyes: Positive for blurred vision.  Respiratory: Positive for shortness of breath and wheezing. Negative for cough.   Gastrointestinal: Positive for heartburn.  Skin: Positive for color change. Negative for wound.  Psychiatric/Behavioral: The patient is nervous/anxious.   All other systems reviewed and are negative.      Objective:   Physical Exam  Constitutional: He is oriented to person, place, and time. He appears well-developed and well-nourished. No distress.  HENT:  Head: Normocephalic.  Right Ear: External ear normal. Right ear tenderness: erythemas.  Left Ear: External ear normal.  Mouth/Throat: Posterior oropharyngeal erythema present.  Eyes: Pupils are equal, round, and reactive to light. Right eye exhibits no discharge. Left eye exhibits no discharge.  Neck: Normal range of motion. Neck supple. No thyromegaly present.  Cardiovascular: Normal rate, regular rhythm, normal heart sounds and intact distal pulses.  No murmur heard. Pulmonary/Chest: Effort normal. No respiratory distress. He has wheezes.  Abdominal: Soft. Bowel sounds  are normal. He exhibits no distension. There is no tenderness.   Musculoskeletal: Normal range of motion. He exhibits edema (trace in left hand and extending to his elbow). He exhibits no tenderness.  Neurological: He is alert and oriented to person, place, and time. He has normal reflexes. No cranial nerve deficit.  Skin: Skin is warm and dry. No rash noted. No erythema.  Psychiatric: He has a normal mood and affect. His behavior is normal. Judgment and thought content normal.  Vitals reviewed.     BP (!) 165/85   Pulse 64   Temp (!) 97.1 F (36.2 C) (Oral)   Ht _0  (1.753 m)   Wt 176 lb (79.8 kg)   BMI 25.99 kg/m      Assessment & Plan:  Ronald [name] comes in today with chief complaint of recheck cellulitis of finger and Diabetes   Diagnosis and orders addressed:  1. CAD in native artery - CMP14+EGFR - Lipid panel - Ambulatory referral to Cardiology  2. Type 2 diabetes mellitus with diabetic polyneuropathy, without long-term current use of insulin (HCC) - Bayer DCA Hb A1c Waived - CMP14+EGFR - Ambulatory referral to Ophthalmology - metFORMIN (GLUCOPHAGE XR) 500 MG 24 hr tablet; Take 1 tablet (500 mg total) by mouth daily with breakfast.  Dispense: 90 tablet; Refill: 1  3. Aortic atherosclerosis (HCC) - CMP14+EGFR - Lipid panel - Ambulatory referral to Cardiology  4. Moderate episode of recurrent major depressive disorder (HCC) - CMP14+EGFR  5. Pulmonary emphysema, unspecified emphysema type (HCC) - CMP14+EGFR - fluticasone furoate-vilanterol (BREO ELLIPTA) 100-25 MCG/INH AEPB; Inhale 1 puff into the lungs daily.  Dispense: 1 each; Refill: 2  6. Tobacco abuse -Smoking cessation discussed - CMP14+EGFR - Ambulatory referral to Cardiology  7. Hypertension associated with diabetes ([place]) - CMP14+EGFR - Ambulatory referral to Cardiology - metoprolol tartrate (LOPRESSOR) 50 MG tablet; Take 1 tablet (50 mg total) by mouth 2 (two) times daily.  Dispense: 180 tablet; Refill: 3 - amLODipine (NORVASC) 5 MG tablet; Take 1 tablet  (5 mg total) by mouth daily.  Dispense: 90 tablet; Refill: 3  8. Diabetic polyneuropathy associated with type 2 diabetes mellitus (HCC) - Bayer DCA Hb A1c Waived - CMP14+EGFR  9. Cellulitis of left hand Continue keflex and start doxycyline RTO in 2 weeks or sooner if symptoms worsen or do not improve - doxycycline (VIBRA-TABS) 100 MG tablet; Take 1 tablet (100 mg total) by mouth 2 (two) times daily.  Dispense: 20 tablet; Refill: 0  10. Hyperlipidemia, unspecified hyperlipidemia type - atorvastatin (LIPITOR) 40 MG tablet; Take 1 tablet (40 mg total) by mouth daily at 6 PM.  Dispense: 90 tablet; Refill: 1  11. Anxiety and depression - ALPRAZolam (XANAX) 0.5 MG tablet; Take 1 tablet (0.5 mg total) by mouth 3 (three) times daily as needed. for anxiety  Dispense: 90 tablet; Refill: 0   Labs pending Health Maintenance reviewed Diet and exercise encouraged  Follow up plan: 2 weeks to recheck HTN and cellulitis. Discussed the importance of bringing all of his medications on his next appt and the importance of taking all his medications every day!!!!!!   [name], FNP

## 2017-11-20 LAB — CMP14+EGFR
ALBUMIN: 4.2 g/dL (ref 3.6–4.8)
ALT: 9 IU/L (ref 0–44)
AST: 12 IU/L (ref 0–40)
Albumin/Globulin Ratio: 1.4 (ref 1.2–2.2)
Alkaline Phosphatase: 121 IU/L — ABNORMAL HIGH (ref 39–117)
BILIRUBIN TOTAL: 0.4 mg/dL (ref 0.0–1.2)
BUN / CREAT RATIO: 15 (ref 10–24)
BUN: 13 mg/dL (ref 8–27)
CO2: 28 mmol/L (ref 20–29)
CREATININE: 0.86 mg/dL (ref 0.76–1.27)
Calcium: 9.4 mg/dL (ref 8.6–10.2)
Chloride: 97 mmol/L (ref 96–106)
GFR calc non Af Amer: 88 mL/min/{1.73_m2} (ref >59)
GFR, EST AFRICAN AMERICAN: 102 mL/min/{1.73_m2} (ref >59)
GLOBULIN, TOTAL: 3 g/dL (ref 1.5–4.5)
Glucose: 109 mg/dL — ABNORMAL HIGH (ref 65–99)
Potassium: 3.9 mmol/L (ref 3.5–5.2)
SODIUM: 139 mmol/L (ref 134–144)
TOTAL PROTEIN: 7.2 g/dL (ref 6.0–8.5)

## 2017-11-20 LAB — LIPID PANEL
Chol/HDL Ratio: 6.7 ratio — ABNORMAL HIGH (ref 0.0–5.0)
Cholesterol, Total: 188 mg/dL (ref 100–199)
HDL: 28 mg/dL — ABNORMAL LOW (ref >39)
LDL CALC: 101 mg/dL — AB (ref 0–99)
Triglycerides: 293 mg/dL — ABNORMAL HIGH (ref 0–149)
VLDL CHOLESTEROL CAL: 59 mg/dL — AB (ref 5–40)

## 2017-11-25 ENCOUNTER — Other Ambulatory Visit: Payer: Self-pay | Admitting: Family

## 2017-11-25 DIAGNOSIS — G4700 Insomnia, unspecified: Secondary | ICD-10-CM

## 2017-11-27 ENCOUNTER — Ambulatory Visit: Payer: Medicare Other | Admitting: Cardiovascular Disease

## 2017-12-03 ENCOUNTER — Ambulatory Visit: Payer: Medicare HMO | Admitting: Family

## 2017-12-17 ENCOUNTER — Telehealth: Payer: Self-pay | Admitting: Family

## 2017-12-17 DIAGNOSIS — F32A Depression, unspecified: Secondary | ICD-10-CM

## 2017-12-17 DIAGNOSIS — F329 Major depressive disorder, single episode, unspecified: Secondary | ICD-10-CM

## 2017-12-17 DIAGNOSIS — F419 Anxiety disorder, unspecified: Principal | ICD-10-CM

## 2017-12-17 MED ORDER — ALPRAZOLAM 0.5 MG PO TABS: 0.5000 mg | ORAL_TABLET | Freq: Three times a day (TID) | ORAL | 4 refills | Status: DC | PRN

## 2017-12-17 NOTE — Telephone Encounter (Signed)
What is the name of the medication? ALPRAZolam (XANAX) 0.5 MG tablet  Have you contacted your pharmacy to request a refill? Yes was told to call us  Which pharmacy would you like this sent to? [facility]   Patient notified that their request is being sent to the clinical staff for review and that they should receive a call once it is complete. If they do not receive a call within 24 hours they can check with their pharmacy or our office.

## 2017-12-17 NOTE — Telephone Encounter (Signed)
Prescription sent to pharmacy.

## 2017-12-22 ENCOUNTER — Telehealth: Payer: Self-pay

## 2017-12-22 DIAGNOSIS — F331 Major depressive disorder, recurrent, moderate: Secondary | ICD-10-CM

## 2017-12-22 DIAGNOSIS — F419 Anxiety disorder, unspecified: Secondary | ICD-10-CM

## 2017-12-22 NOTE — Telephone Encounter (Signed)
Referral placed.

## 2017-12-22 NOTE — Addendum Note (Signed)
Addended by: [name] A on: 12/22/2017 06:23 PM   Modules accepted: Orders

## 2017-12-22 NOTE — Telephone Encounter (Signed)
Needs a referral to Dr [facility]

## 2018-01-05 ENCOUNTER — Ambulatory Visit: Payer: Self-pay | Admitting: Cardiovascular Disease

## 2018-01-07 DIAGNOSIS — Z823 Family history of stroke: Secondary | ICD-10-CM | POA: Diagnosis not present

## 2018-01-07 DIAGNOSIS — E785 Hyperlipidemia, unspecified: Secondary | ICD-10-CM | POA: Diagnosis not present

## 2018-01-07 DIAGNOSIS — G4700 Insomnia, unspecified: Secondary | ICD-10-CM | POA: Diagnosis not present

## 2018-01-07 DIAGNOSIS — Z7984 Long term (current) use of oral hypoglycemic drugs: Secondary | ICD-10-CM | POA: Diagnosis not present

## 2018-01-07 DIAGNOSIS — R69 Illness, unspecified: Secondary | ICD-10-CM | POA: Diagnosis not present

## 2018-01-07 DIAGNOSIS — E119 Type 2 diabetes mellitus without complications: Secondary | ICD-10-CM | POA: Diagnosis not present

## 2018-01-07 DIAGNOSIS — I10 Essential (primary) hypertension: Secondary | ICD-10-CM | POA: Diagnosis not present

## 2018-01-07 DIAGNOSIS — Z8249 Family history of ischemic heart disease and other diseases of the circulatory system: Secondary | ICD-10-CM | POA: Diagnosis not present

## 2018-01-07 DIAGNOSIS — Z809 Family history of malignant neoplasm, unspecified: Secondary | ICD-10-CM | POA: Diagnosis not present

## 2018-01-07 DIAGNOSIS — Z720 Tobacco use: Secondary | ICD-10-CM | POA: Diagnosis not present

## 2018-02-10 ENCOUNTER — Other Ambulatory Visit: Payer: Self-pay | Admitting: Family Medicine

## 2018-02-11 DIAGNOSIS — R69 Illness, unspecified: Secondary | ICD-10-CM | POA: Diagnosis not present

## 2018-02-18 ENCOUNTER — Ambulatory Visit: Payer: Self-pay | Admitting: Cardiovascular Disease

## 2018-02-24 DIAGNOSIS — J449 Chronic obstructive pulmonary disease, unspecified: Secondary | ICD-10-CM | POA: Diagnosis not present

## 2018-02-24 DIAGNOSIS — L03114 Cellulitis of left upper limb: Secondary | ICD-10-CM | POA: Diagnosis not present

## 2018-02-24 DIAGNOSIS — E119 Type 2 diabetes mellitus without complications: Secondary | ICD-10-CM | POA: Diagnosis not present

## 2018-02-24 DIAGNOSIS — Z955 Presence of coronary angioplasty implant and graft: Secondary | ICD-10-CM | POA: Diagnosis not present

## 2018-02-24 DIAGNOSIS — I252 Old myocardial infarction: Secondary | ICD-10-CM | POA: Diagnosis not present

## 2018-02-24 DIAGNOSIS — I10 Essential (primary) hypertension: Secondary | ICD-10-CM | POA: Diagnosis not present

## 2018-02-24 DIAGNOSIS — R69 Illness, unspecified: Secondary | ICD-10-CM | POA: Diagnosis not present

## 2018-02-24 DIAGNOSIS — Z79899 Other long term (current) drug therapy: Secondary | ICD-10-CM | POA: Diagnosis not present

## 2018-02-24 DIAGNOSIS — K219 Gastro-esophageal reflux disease without esophagitis: Secondary | ICD-10-CM | POA: Diagnosis not present

## 2018-02-24 DIAGNOSIS — L02414 Cutaneous abscess of left upper limb: Secondary | ICD-10-CM | POA: Diagnosis not present

## 2018-03-05 ENCOUNTER — Other Ambulatory Visit: Payer: Self-pay | Admitting: Family

## 2018-03-05 DIAGNOSIS — K219 Gastro-esophageal reflux disease without esophagitis: Secondary | ICD-10-CM

## 2018-03-21 DIAGNOSIS — R69 Illness, unspecified: Secondary | ICD-10-CM | POA: Diagnosis not present

## 2018-03-25 ENCOUNTER — Other Ambulatory Visit: Payer: Self-pay | Admitting: Family

## 2018-03-25 DIAGNOSIS — J439 Emphysema, unspecified: Secondary | ICD-10-CM

## 2018-05-07 ENCOUNTER — Other Ambulatory Visit: Payer: Self-pay | Admitting: Family

## 2018-05-07 DIAGNOSIS — E1159 Type 2 diabetes mellitus with other circulatory complications: Secondary | ICD-10-CM

## 2018-05-07 DIAGNOSIS — I152 Hypertension secondary to endocrine disorders: Secondary | ICD-10-CM

## 2018-05-07 DIAGNOSIS — I10 Essential (primary) hypertension: Principal | ICD-10-CM

## 2018-05-11 ENCOUNTER — Other Ambulatory Visit: Payer: Self-pay | Admitting: Family

## 2018-05-11 DIAGNOSIS — R69 Illness, unspecified: Secondary | ICD-10-CM | POA: Diagnosis not present

## 2018-05-11 DIAGNOSIS — J439 Emphysema, unspecified: Secondary | ICD-10-CM

## 2018-05-27 ENCOUNTER — Other Ambulatory Visit: Payer: Self-pay | Admitting: Family

## 2018-05-27 DIAGNOSIS — K219 Gastro-esophageal reflux disease without esophagitis: Secondary | ICD-10-CM

## 2018-05-27 DIAGNOSIS — E1142 Type 2 diabetes mellitus with diabetic polyneuropathy: Secondary | ICD-10-CM

## 2018-05-27 NOTE — Telephone Encounter (Signed)
Last seen 11/19/17

## 2018-06-21 DIAGNOSIS — I499 Cardiac arrhythmia, unspecified: Secondary | ICD-10-CM | POA: Diagnosis not present

## 2018-06-21 DIAGNOSIS — R601 Generalized edema: Secondary | ICD-10-CM | POA: Diagnosis not present

## 2018-06-21 DIAGNOSIS — R531 Weakness: Secondary | ICD-10-CM | POA: Diagnosis not present

## 2018-06-21 DIAGNOSIS — M7989 Other specified soft tissue disorders: Secondary | ICD-10-CM | POA: Diagnosis not present

## 2018-06-21 DIAGNOSIS — L03113 Cellulitis of right upper limb: Secondary | ICD-10-CM | POA: Diagnosis not present

## 2018-06-21 DIAGNOSIS — J441 Chronic obstructive pulmonary disease with (acute) exacerbation: Secondary | ICD-10-CM | POA: Diagnosis not present

## 2018-06-23 DIAGNOSIS — E785 Hyperlipidemia, unspecified: Secondary | ICD-10-CM | POA: Diagnosis not present

## 2018-06-23 DIAGNOSIS — R600 Localized edema: Secondary | ICD-10-CM | POA: Diagnosis not present

## 2018-06-23 DIAGNOSIS — J441 Chronic obstructive pulmonary disease with (acute) exacerbation: Secondary | ICD-10-CM | POA: Diagnosis not present

## 2018-06-23 DIAGNOSIS — R69 Illness, unspecified: Secondary | ICD-10-CM | POA: Diagnosis not present

## 2018-06-23 DIAGNOSIS — L03119 Cellulitis of unspecified part of limb: Secondary | ICD-10-CM | POA: Diagnosis not present

## 2018-06-23 DIAGNOSIS — R2232 Localized swelling, mass and lump, left upper limb: Secondary | ICD-10-CM | POA: Diagnosis not present

## 2018-06-23 DIAGNOSIS — M7989 Other specified soft tissue disorders: Secondary | ICD-10-CM | POA: Diagnosis not present

## 2018-06-23 DIAGNOSIS — I2510 Atherosclerotic heart disease of native coronary artery without angina pectoris: Secondary | ICD-10-CM | POA: Diagnosis not present

## 2018-06-23 DIAGNOSIS — E119 Type 2 diabetes mellitus without complications: Secondary | ICD-10-CM | POA: Diagnosis not present

## 2018-06-23 DIAGNOSIS — I10 Essential (primary) hypertension: Secondary | ICD-10-CM | POA: Diagnosis not present

## 2018-06-23 DIAGNOSIS — R2231 Localized swelling, mass and lump, right upper limb: Secondary | ICD-10-CM | POA: Diagnosis not present

## 2018-06-23 DIAGNOSIS — I252 Old myocardial infarction: Secondary | ICD-10-CM | POA: Diagnosis not present

## 2018-06-23 DIAGNOSIS — K219 Gastro-esophageal reflux disease without esophagitis: Secondary | ICD-10-CM | POA: Diagnosis not present

## 2018-06-23 DIAGNOSIS — R0602 Shortness of breath: Secondary | ICD-10-CM | POA: Diagnosis not present

## 2018-06-23 DIAGNOSIS — J449 Chronic obstructive pulmonary disease, unspecified: Secondary | ICD-10-CM | POA: Diagnosis not present

## 2018-06-26 ENCOUNTER — Other Ambulatory Visit: Payer: Self-pay | Admitting: Family

## 2018-06-26 DIAGNOSIS — F329 Major depressive disorder, single episode, unspecified: Secondary | ICD-10-CM

## 2018-06-26 DIAGNOSIS — F419 Anxiety disorder, unspecified: Principal | ICD-10-CM

## 2018-06-26 DIAGNOSIS — F32A Depression, unspecified: Secondary | ICD-10-CM

## 2018-07-05 ENCOUNTER — Other Ambulatory Visit: Payer: Self-pay | Admitting: Family

## 2018-07-05 DIAGNOSIS — F419 Anxiety disorder, unspecified: Principal | ICD-10-CM

## 2018-07-05 DIAGNOSIS — F32A Depression, unspecified: Secondary | ICD-10-CM

## 2018-07-05 DIAGNOSIS — F329 Major depressive disorder, single episode, unspecified: Secondary | ICD-10-CM

## 2018-07-06 NOTE — Telephone Encounter (Signed)
Last seen 11/19/17

## 2018-07-12 ENCOUNTER — Other Ambulatory Visit: Payer: Self-pay | Admitting: Family

## 2018-07-12 DIAGNOSIS — J439 Emphysema, unspecified: Secondary | ICD-10-CM

## 2018-07-13 NOTE — Telephone Encounter (Signed)
Last seen 11/19/17

## 2018-07-14 ENCOUNTER — Other Ambulatory Visit: Payer: Self-pay | Admitting: Family

## 2018-07-14 DIAGNOSIS — F419 Anxiety disorder, unspecified: Principal | ICD-10-CM

## 2018-07-14 DIAGNOSIS — F329 Major depressive disorder, single episode, unspecified: Secondary | ICD-10-CM

## 2018-07-14 DIAGNOSIS — F32A Depression, unspecified: Secondary | ICD-10-CM

## 2018-07-14 NOTE — Telephone Encounter (Signed)
Patient needs to discuss this with Alyse Low, she will be back tomorrow

## 2018-07-14 NOTE — Telephone Encounter (Signed)
Last seen 11/19/17  Ronald [name]

## 2018-07-21 ENCOUNTER — Ambulatory Visit (INDEPENDENT_AMBULATORY_CARE_PROVIDER_SITE_OTHER): Payer: Medicare HMO | Admitting: Family

## 2018-07-21 ENCOUNTER — Ambulatory Visit (INDEPENDENT_AMBULATORY_CARE_PROVIDER_SITE_OTHER): Payer: Medicare HMO

## 2018-07-21 ENCOUNTER — Encounter: Payer: Self-pay | Admitting: Family

## 2018-07-21 VITALS — BP 163/78 | HR 69 | Temp 97.5°F | Ht 69.0 in | Wt 183.0 lb

## 2018-07-21 DIAGNOSIS — R0602 Shortness of breath: Secondary | ICD-10-CM

## 2018-07-21 DIAGNOSIS — F419 Anxiety disorder, unspecified: Secondary | ICD-10-CM | POA: Diagnosis not present

## 2018-07-21 DIAGNOSIS — F329 Major depressive disorder, single episode, unspecified: Secondary | ICD-10-CM

## 2018-07-21 DIAGNOSIS — R059 Cough, unspecified: Secondary | ICD-10-CM

## 2018-07-21 DIAGNOSIS — R69 Illness, unspecified: Secondary | ICD-10-CM | POA: Diagnosis not present

## 2018-07-21 DIAGNOSIS — F32A Depression, unspecified: Secondary | ICD-10-CM

## 2018-07-21 DIAGNOSIS — J984 Other disorders of lung: Secondary | ICD-10-CM | POA: Diagnosis not present

## 2018-07-21 DIAGNOSIS — M7989 Other specified soft tissue disorders: Secondary | ICD-10-CM

## 2018-07-21 DIAGNOSIS — R05 Cough: Secondary | ICD-10-CM

## 2018-07-21 MED ORDER — FUROSEMIDE 20 MG PO TABS: 20.0000 mg | ORAL_TABLET | Freq: Every day | ORAL | 3 refills | Status: DC

## 2018-07-21 MED ORDER — ALPRAZOLAM 0.5 MG PO TABS: 0.5000 mg | ORAL_TABLET | Freq: Three times a day (TID) | ORAL | 4 refills | Status: DC | PRN

## 2018-07-21 NOTE — Progress Notes (Signed)
 Subjective:    Patient ID: Ronald [name], male    DOB: 03/24/1946, 73 y.o.   MRN: [id number]  Chief Complaint  Patient presents with  . swollen, infected arms   PT presents to the office today with bilateral arm swelling and erythemas that started 10 months ago. He has been to Urgent Care and was sent to the  ED was treated as cellulitis.  Pt went to the ED on 06/23/18 and left AMA. He was given a rx of amoxicillin 875 mg BID for 10 days and prednisone tamper dose. He states he has no improvement.   He denies any pain at this time, but states the pain is intermittent. He is afebrile today.  He is a smoker and smokes "a little over a pack a day". Has intermittent SOB.  Anxiety  Presents for follow-up visit. Symptoms include decreased concentration, depressed mood, excessive worry and nervous/anxious behavior.        Review of Systems  Psychiatric/Behavioral: Positive for decreased concentration. The patient is nervous/anxious.   All other systems reviewed and are negative.      Objective:   Physical Exam Vitals signs reviewed.  Constitutional:      General: He is not in acute distress.    Appearance: He is well-developed.  HENT:     Head: Normocephalic.     Right Ear: Tympanic membrane normal.     Left Ear: Tympanic membrane normal.  Eyes:     General:        Right eye: No discharge.        Left eye: No discharge.     Pupils: Pupils are equal, round, and reactive to light.  Neck:     Musculoskeletal: Normal range of motion and neck supple.     Thyroid: No thyromegaly.  Cardiovascular:     Rate and Rhythm: Normal rate and regular rhythm.     Heart sounds: Normal heart sounds. No murmur.  Pulmonary:     Effort: Pulmonary effort is normal. No respiratory distress.     Breath sounds: Decreased breath sounds present. No wheezing.  Abdominal:     General: Bowel sounds are normal. There is no distension.     Palpations: Abdomen is soft.     Tenderness: There is no  abdominal tenderness.  Musculoskeletal:        General: Swelling (3+ bilateral arms) present. No tenderness.     Right lower leg: Edema (2+) present.     Left lower leg: Edema (2+) present.  Skin:    General: Skin is warm and dry.     Findings: No erythema or rash.     Comments: Gray  Neurological:     Mental Status: He is alert and oriented to person, place, and time.     Cranial Nerves: No cranial nerve deficit.     Deep Tendon Reflexes: Reflexes are normal and symmetric.  Psychiatric:        Behavior: Behavior normal.        Thought Content: Thought content normal.        Judgment: Judgment normal.       BP (!) 163/78   Pulse 69   Temp (!) 97.5 F (36.4 C) (Oral)   Ht 5' 9" (1.753 m)   Wt 183 lb (83 kg)   BMI 27.02 kg/m      Assessment & Plan:  [name] comes in today with chief complaint of swollen, infected arms   Diagnosis and orders   addressed:  1. Cough - DG Chest 2 View; Future - CMP14+EGFR - CBC with Differential/Platelet - CT Chest W Contrast; Future - TSH - Brain natriuretic peptide - ANA, IFA Comprehensive Panel - C-reactive protein - ANA Comprehensive Panel  2. SOB (shortness of breath) - DG Chest 2 View; Future - CMP14+EGFR - CBC with Differential/Platelet - CT Chest W Contrast; Future - TSH - Brain natriuretic peptide - ANA, IFA Comprehensive Panel - C-reactive protein - furosemide (LASIX) 20 MG tablet; Take 1 tablet (20 mg total) by mouth daily.  Dispense: 30 tablet; Refill: 3 - ANA Comprehensive Panel  3. Left upper extremity swelling Worrisome for some blockage? Superior [place]?  CT pending - CMP14+EGFR - CBC with Differential/Platelet - CT Chest W Contrast; Future - TSH - Brain natriuretic peptide - ANA, IFA Comprehensive Panel - C-reactive protein - furosemide (LASIX) 20 MG tablet; Take 1 tablet (20 mg total) by mouth daily.  Dispense: 30 tablet; Refill: 3 - ANA Comprehensive Panel  4. Anxiety and depression PT  reviewed in [place] controlled database- No red flags - ALPRAZolam (XANAX) 0.5 MG tablet; Take 1 tablet (0.5 mg total) by mouth 3 (three) times daily as needed. for anxiety  Dispense: 90 tablet; Refill: 4 - ANA Comprehensive Panel   Labs pending Health Maintenance reviewed Diet and exercise encouraged  Follow up plan: 1 week     , FNP  

## 2018-07-21 NOTE — Patient Instructions (Signed)
Peripheral Edema  Peripheral edema is swelling that is caused by a buildup of fluid. Peripheral edema most often affects the lower legs, ankles, and feet. It can also develop in the arms, hands, and face. The area of the body that has peripheral edema will look swollen. It may also feel heavy or warm. Your clothes may start to feel tight. Pressing on the area may make a temporary dent in your skin. You may not be able to move your arm or leg as much as usual. There are many causes of peripheral edema. It can be a complication of other diseases, such as congestive heart failure, kidney disease, or a problem with your blood circulation. It also can be a side effect of certain medicines. It often happens to women during pregnancy. Sometimes, the cause is not known. Treating the underlying condition is often the only treatment for peripheral edema. Follow these instructions at home: Pay attention to any changes in your symptoms. Take these actions to help with your discomfort:  Raise (elevate) your legs while you are sitting or lying down.  Move around often to prevent stiffness and to lessen swelling. Do not sit or stand for long periods of time.  Wear support stockings as told by your health care provider.  Follow instructions from your health care provider about limiting salt (sodium) in your diet. Sometimes eating less salt can reduce swelling.  Take over-the-counter and prescription medicines only as told by your health care provider. Your health care provider may prescribe medicine to help your body get rid of excess water (diuretic).  Keep all follow-up visits as told by your health care provider. This is important. Contact a health care provider if:  You have a fever.  Your edema starts suddenly or is getting worse, especially if you are pregnant or have a medical condition.  You have swelling in only one leg.  You have increased swelling and pain in your legs. Get help right away if:   You develop shortness of breath, especially when you are lying down.  You have pain in your chest or abdomen.  You feel weak.  You faint. This information is not intended to replace advice given to you by your health care provider. Make sure you discuss any questions you have with your health care provider. Document Released: 07/03/2004 Document Revised: 10/29/2015 Document Reviewed: 12/06/2014 Elsevier Interactive Patient Education  2019 [employer]  

## 2018-07-22 LAB — CMP14+EGFR
ALBUMIN: 3.9 g/dL (ref 3.8–4.8)
ALT: 9 IU/L (ref 0–44)
AST: 11 IU/L (ref 0–40)
Albumin/Globulin Ratio: 1.3 (ref 1.2–2.2)
Alkaline Phosphatase: 108 IU/L (ref 39–117)
BUN/Creatinine Ratio: 15 (ref 10–24)
BUN: 14 mg/dL (ref 8–27)
Bilirubin Total: 0.4 mg/dL (ref 0.0–1.2)
CO2: 25 mmol/L (ref 20–29)
Calcium: 9.2 mg/dL (ref 8.6–10.2)
Chloride: 101 mmol/L (ref 96–106)
Creatinine, Ser: 0.92 mg/dL (ref 0.76–1.27)
GFR calc Af Amer: 97 mL/min/{1.73_m2} (ref >59)
GFR, EST NON AFRICAN AMERICAN: 84 mL/min/{1.73_m2} (ref >59)
Globulin, Total: 3 g/dL (ref 1.5–4.5)
Glucose: 109 mg/dL — ABNORMAL HIGH (ref 65–99)
Potassium: 4.2 mmol/L (ref 3.5–5.2)
Sodium: 143 mmol/L (ref 134–144)
Total Protein: 6.9 g/dL (ref 6.0–8.5)

## 2018-07-22 LAB — ANA COMPREHENSIVE PANEL
Anti JO-1: <0.2 AI (ref 0.0–0.9)
Centromere Ab Screen: <0.2 AI (ref 0.0–0.9)
Chromatin Ab SerPl-aCnc: <0.2 AI (ref 0.0–0.9)
ENA RNP Ab: <0.2 AI (ref 0.0–0.9)
ENA SM Ab Ser-aCnc: <0.2 AI (ref 0.0–0.9)
ENA SSA (RO) Ab: <0.2 AI (ref 0.0–0.9)
ENA SSB (LA) Ab: <0.2 AI (ref 0.0–0.9)
Scleroderma SCL-70: <0.2 AI (ref 0.0–0.9)

## 2018-07-22 LAB — CBC WITH DIFFERENTIAL/PLATELET
Basophils Absolute: 0.1 10*3/uL (ref 0.0–0.2)
Basos: 1 %
EOS (ABSOLUTE): 0.1 10*3/uL (ref 0.0–0.4)
Eos: 2 %
HEMATOCRIT: 46.9 % (ref 37.5–51.0)
Hemoglobin: 16.3 g/dL (ref 13.0–17.7)
Immature Grans (Abs): 0 10*3/uL (ref 0.0–0.1)
Immature Granulocytes: 1 %
LYMPHS ABS: 1.4 10*3/uL (ref 0.7–3.1)
Lymphs: 18 %
MCH: 28.4 pg (ref 26.6–33.0)
MCHC: 34.8 g/dL (ref 31.5–35.7)
MCV: 82 fL (ref 79–97)
Monocytes Absolute: 0.9 10*3/uL (ref 0.1–0.9)
Monocytes: 12 %
Neutrophils Absolute: 5 10*3/uL (ref 1.4–7.0)
Neutrophils: 66 %
Platelets: 390 10*3/uL (ref 150–450)
RBC: 5.73 x10E6/uL (ref 4.14–5.80)
RDW: 13.4 % (ref 11.6–15.4)
WBC: 7.6 10*3/uL (ref 3.4–10.8)

## 2018-07-22 LAB — C-REACTIVE PROTEIN: CRP: 5 mg/L (ref 0–10)

## 2018-07-22 LAB — TSH: TSH: 1.36 u[IU]/mL (ref 0.450–4.500)

## 2018-07-22 LAB — BRAIN NATRIURETIC PEPTIDE: BNP: 189 pg/mL — ABNORMAL HIGH (ref 0.0–100.0)

## 2018-07-25 ENCOUNTER — Other Ambulatory Visit: Payer: Self-pay | Admitting: Family

## 2018-07-25 DIAGNOSIS — I152 Hypertension secondary to endocrine disorders: Secondary | ICD-10-CM

## 2018-07-25 DIAGNOSIS — E1159 Type 2 diabetes mellitus with other circulatory complications: Secondary | ICD-10-CM

## 2018-07-25 DIAGNOSIS — G4700 Insomnia, unspecified: Secondary | ICD-10-CM

## 2018-07-25 DIAGNOSIS — I10 Essential (primary) hypertension: Secondary | ICD-10-CM

## 2018-07-26 ENCOUNTER — Other Ambulatory Visit: Payer: Self-pay | Admitting: Family

## 2018-07-26 DIAGNOSIS — I2510 Atherosclerotic heart disease of native coronary artery without angina pectoris: Secondary | ICD-10-CM

## 2018-07-26 DIAGNOSIS — I700 Atherosclerosis of aorta: Secondary | ICD-10-CM

## 2018-07-26 DIAGNOSIS — I509 Heart failure, unspecified: Secondary | ICD-10-CM

## 2018-07-30 ENCOUNTER — Ambulatory Visit: Payer: Medicare HMO | Admitting: Family

## 2018-08-03 ENCOUNTER — Other Ambulatory Visit: Payer: Self-pay | Admitting: Family

## 2018-08-03 DIAGNOSIS — E785 Hyperlipidemia, unspecified: Secondary | ICD-10-CM

## 2018-08-05 ENCOUNTER — Ambulatory Visit: Payer: Medicare HMO | Admitting: Family

## 2018-08-13 ENCOUNTER — Ambulatory Visit: Payer: Medicare HMO | Admitting: Family

## 2018-08-17 ENCOUNTER — Encounter: Payer: Self-pay | Admitting: Family

## 2018-08-18 ENCOUNTER — Ambulatory Visit: Payer: Medicare HMO | Admitting: Family

## 2018-08-18 ENCOUNTER — Other Ambulatory Visit: Payer: Self-pay | Admitting: Family

## 2018-08-18 DIAGNOSIS — J439 Emphysema, unspecified: Secondary | ICD-10-CM

## 2018-08-23 ENCOUNTER — Other Ambulatory Visit: Payer: Self-pay | Admitting: Family

## 2018-08-23 DIAGNOSIS — K219 Gastro-esophageal reflux disease without esophagitis: Secondary | ICD-10-CM

## 2018-08-24 DIAGNOSIS — K219 Gastro-esophageal reflux disease without esophagitis: Secondary | ICD-10-CM | POA: Diagnosis not present

## 2018-08-24 DIAGNOSIS — R600 Localized edema: Secondary | ICD-10-CM | POA: Diagnosis not present

## 2018-08-24 DIAGNOSIS — I2510 Atherosclerotic heart disease of native coronary artery without angina pectoris: Secondary | ICD-10-CM | POA: Diagnosis not present

## 2018-08-24 DIAGNOSIS — R69 Illness, unspecified: Secondary | ICD-10-CM | POA: Diagnosis not present

## 2018-08-24 DIAGNOSIS — Z7951 Long term (current) use of inhaled steroids: Secondary | ICD-10-CM | POA: Diagnosis not present

## 2018-08-24 DIAGNOSIS — J439 Emphysema, unspecified: Secondary | ICD-10-CM | POA: Diagnosis not present

## 2018-08-24 DIAGNOSIS — I10 Essential (primary) hypertension: Secondary | ICD-10-CM | POA: Diagnosis not present

## 2018-08-24 DIAGNOSIS — I252 Old myocardial infarction: Secondary | ICD-10-CM | POA: Diagnosis not present

## 2018-08-27 ENCOUNTER — Ambulatory Visit (INDEPENDENT_AMBULATORY_CARE_PROVIDER_SITE_OTHER): Payer: Medicare HMO | Admitting: Family

## 2018-08-27 ENCOUNTER — Other Ambulatory Visit: Payer: Self-pay

## 2018-08-27 ENCOUNTER — Encounter (HOSPITAL_COMMUNITY): Payer: Self-pay

## 2018-08-27 ENCOUNTER — Encounter: Payer: Self-pay | Admitting: Family

## 2018-08-27 ENCOUNTER — Telehealth: Payer: Self-pay | Admitting: *Deleted

## 2018-08-27 ENCOUNTER — Ambulatory Visit (HOSPITAL_COMMUNITY)
Admission: RE | Admit: 2018-08-27 | Discharge: 2018-08-27 | Disposition: A | Discharge status: Home / Self Care | Payer: Medicare HMO | Source: Ambulatory Visit | Attending: Family | Admitting: Family

## 2018-08-27 VITALS — BP 147/81 | HR 71 | Temp 98.1°F | Ht 69.0 in | Wt 180.0 lb

## 2018-08-27 DIAGNOSIS — J439 Emphysema, unspecified: Secondary | ICD-10-CM | POA: Diagnosis not present

## 2018-08-27 DIAGNOSIS — M7989 Other specified soft tissue disorders: Secondary | ICD-10-CM

## 2018-08-27 DIAGNOSIS — Z720 Tobacco use: Secondary | ICD-10-CM

## 2018-08-27 DIAGNOSIS — R0602 Shortness of breath: Secondary | ICD-10-CM

## 2018-08-27 DIAGNOSIS — I509 Heart failure, unspecified: Secondary | ICD-10-CM | POA: Diagnosis not present

## 2018-08-27 DIAGNOSIS — J441 Chronic obstructive pulmonary disease with (acute) exacerbation: Secondary | ICD-10-CM | POA: Diagnosis not present

## 2018-08-27 DIAGNOSIS — I700 Atherosclerosis of aorta: Secondary | ICD-10-CM

## 2018-08-27 DIAGNOSIS — I2510 Atherosclerotic heart disease of native coronary artery without angina pectoris: Secondary | ICD-10-CM | POA: Diagnosis not present

## 2018-08-27 DIAGNOSIS — K219 Gastro-esophageal reflux disease without esophagitis: Secondary | ICD-10-CM

## 2018-08-27 DIAGNOSIS — I5030 Unspecified diastolic (congestive) heart failure: Secondary | ICD-10-CM | POA: Insufficient documentation

## 2018-08-27 MED ORDER — FUROSEMIDE 20 MG PO TABS: 20.0000 mg | ORAL_TABLET | Freq: Two times a day (BID) | ORAL | 3 refills | Status: DC

## 2018-08-27 MED ORDER — PANTOPRAZOLE SODIUM 40 MG PO TBEC: 40.0000 mg | DELAYED_RELEASE_TABLET | Freq: Every day | ORAL | 0 refills | Status: DC

## 2018-08-27 MED ORDER — IOHEXOL 300 MG/ML  SOLN
75.0000 mL | Freq: Once | INTRAMUSCULAR | Status: AC | PRN
  Administered 2018-08-27: 75 mL via INTRAVENOUS

## 2018-08-27 NOTE — Progress Notes (Signed)
Subjective:    Patient ID: Ronald [name], male    DOB: April 27, 1946, 73 y.o.   MRN: [id number]  Chief Complaint  Patient presents with  . Shortness of Breath  . Arm Swelling   PT presents to the office today to recheck SOB, COPD, and bilateral arm pain. He was seen on 07/21/18 and had a x-ray that showed emphysema, but no acute problems. At CT scan was ordered to rule out SVC. It was denied, and has cancelled multiple follow up appointment. At his last appointment, it was recommended that he go to the ED, but he refused. His breathing is improved today. But continues to have bilateral arm swelling with weeping edema.   Pt can not read and is a poor historian, but states he is taking his lasix 20 mg daily. He has a hx of MI and is followed by Cardiologists. He did a referral on his last appointment, but has not scheduled this appointment.  Shortness of Breath  This is a chronic problem. The current episode started more than 1 month ago. The problem occurs every few minutes. The problem has been waxing and waning. Associated symptoms include orthopnea, sputum production and wheezing. Pertinent negatives include no chest pain, ear pain, fever, headaches, leg pain, leg swelling or sore throat. Risk factors include smoking. He has tried rest for the symptoms. The treatment provided mild relief. His past medical history is significant for CAD and COPD.      Review of Systems  Constitutional: Negative for fever.  HENT: Negative for ear pain and sore throat.   Respiratory: Positive for sputum production, shortness of breath and wheezing.   Cardiovascular: Positive for orthopnea. Negative for chest pain and leg swelling.  Neurological: Negative for headaches.  All other systems reviewed and are negative.      Objective:   Physical Exam Vitals signs reviewed.  Constitutional:      General: He is not in acute distress.    Appearance: He is well-developed.  HENT:     Head: Normocephalic.   Right Ear: External ear normal.     Left Ear: External ear normal.  Eyes:     General:        Right eye: No discharge.        Left eye: No discharge.     Pupils: Pupils are equal, round, and reactive to light.  Cardiovascular:     Rate and Rhythm: Normal rate and regular rhythm.     Heart sounds: Normal heart sounds. No murmur.  Pulmonary:     Effort: Pulmonary effort is normal. No respiratory distress.     Breath sounds: Decreased breath sounds and wheezing present.  Abdominal:     General: Bowel sounds are normal. There is no distension.     Palpations: Abdomen is soft.     Tenderness: There is no abdominal tenderness.  Musculoskeletal:        General: Swelling present. No tenderness.     Comments: Bilateral arms 3+ swelling, erythemas  Skin:    General: Skin is warm and dry.     Findings: No erythema or rash.  Neurological:     Mental Status: He is alert and oriented to person, place, and time.     Cranial Nerves: No cranial nerve deficit.     Deep Tendon Reflexes: Reflexes are normal and symmetric.  Psychiatric:        Behavior: Behavior normal.        Thought Content: Thought content  normal.        Judgment: Judgment normal.       BP (!) 147/81   Pulse 71   Temp 98.1 F (36.7 C) (Oral)   Ht 5' 9"  (1.753 m)   Wt 180 lb (81.6 kg)   BMI 26.58 kg/m      Assessment & Plan:  Ronald [name] comes in today with chief complaint of Shortness of Breath and Arm Swelling   Diagnosis and orders addressed:  1. Chronic obstructive pulmonary disease with acute exacerbation (HCC) Continue Breo  Stat CT ordered - CT Chest W Contrast; Future - BMP8+EGFR  2. Shortness of breath Will increase Lasix 20 mg to BID from daily - CT Chest W Contrast; Future - furosemide (LASIX) 20 MG tablet; Take 1 tablet (20 mg total) by mouth 2 (two) times daily.  Dispense: 60 tablet; Refill: 3 - BMP8+EGFR  3. CAD in native artery - BMP8+EGFR  4. Aortic atherosclerosis (HCC) - BMP8+EGFR   5. Pulmonary emphysema, unspecified emphysema type (HCC) - BMP8+EGFR  6. Tobacco abuse - BMP8+EGFR  7. Congestive heart failure, unspecified HF chronicity, unspecified heart failure type (HCC) Increase Lasix to BID from daily Low salt diet - furosemide (LASIX) 20 MG tablet; Take 1 tablet (20 mg total) by mouth 2 (two) times daily.  Dispense: 60 tablet; Refill: 3 - BMP8+EGFR  8. SOB (shortness of breath) - furosemide (LASIX) 20 MG tablet; Take 1 tablet (20 mg total) by mouth 2 (two) times daily.  Dispense: 60 tablet; Refill: 3 - BMP8+EGFR  9. Left upper extremity swelling - furosemide (LASIX) 20 MG tablet; Take 1 tablet (20 mg total) by mouth 2 (two) times daily.  Dispense: 60 tablet; Refill: 3 - BMP8+EGFR   Pt needs to follow up with Cardiologists!!!! He will get stat CT scan to rule out SVC Will increase Lasix to BID from daily Keep arms elevated when possible Smoking cessation discussed Continue Breo Labs pending  Follow up in 2 weeks    [name], FNP

## 2018-08-27 NOTE — Telephone Encounter (Signed)
Med resent to [facility] drug

## 2018-08-27 NOTE — Patient Instructions (Signed)
Chronic Obstructive Pulmonary Disease Chronic obstructive pulmonary disease (COPD) is a long-term (chronic) lung problem. When you have COPD, it is hard for air to get in and out of your lungs. Usually the condition gets worse over time, and your lungs will never return to normal. There are things you can do to keep yourself as healthy as possible.  Your doctor may treat your condition with: ? Medicines. ? Oxygen. ? Lung surgery.  Your doctor may also recommend: ? Rehabilitation. This includes steps to make your body work better. It may involve a team of specialists. ? Quitting smoking, if you smoke. ? Exercise and changes to your diet. ? Comfort measures (palliative care). Follow these instructions at home: Medicines  Take over-the-counter and prescription medicines only as told by your doctor.  Talk to your doctor before taking any cough or allergy medicines. You may need to avoid medicines that cause your lungs to be dry. Lifestyle  If you smoke, stop. Smoking makes the problem worse. If you need help quitting, ask your doctor.  Avoid being around things that make your breathing worse. This may include smoke, chemicals, and fumes.  Stay active, but remember to rest as well.  Learn and use tips on how to relax.  Make sure you get enough sleep. Most adults need at least 7 hours of sleep every night.  Eat healthy foods. Eat smaller meals more often. Rest before meals. Controlled breathing Learn and use tips on how to control your breathing as told by your doctor. Try:  Breathing in (inhaling) through your nose for 1 second. Then, pucker your lips and breath out (exhale) through your lips for 2 seconds.  Putting one hand on your belly (abdomen). Breathe in slowly through your nose for 1 second. Your hand on your belly should move out. Pucker your lips and breathe out slowly through your lips. Your hand on your belly should move in as you breathe out.  Controlled coughing Learn  and use controlled coughing to clear mucus from your lungs. Follow these steps: 1. Lean your head a little forward. 2. Breathe in deeply. 3. Try to hold your breath for 3 seconds. 4. Keep your mouth slightly open while coughing 2 times. 5. Spit any mucus out into a tissue. 6. Rest and do the steps again 1 or 2 times as needed. General instructions  Make sure you get all the shots (vaccines) that your doctor recommends. Ask your doctor about a flu shot and a pneumonia shot.  Use oxygen therapy and pulmonary rehabilitation if told by your doctor. If you need home oxygen therapy, ask your doctor if you should buy a tool to measure your oxygen level (oximeter).  Make a COPD action plan with your doctor. This helps you to know what to do if you feel worse than usual.  Manage any other conditions you have as told by your doctor.  Avoid going outside when it is very hot, cold, or humid.  Avoid people who have a sickness you can catch (contagious).  Keep all follow-up visits as told by your doctor. This is important. Contact a doctor if:  You cough up more mucus than usual.  There is a change in the color or thickness of the mucus.  It is harder to breathe than usual.  Your breathing is faster than usual.  You have trouble sleeping.  You need to use your medicines more often than usual.  You have trouble doing your normal activities such as getting dressed   or walking around the house. Get help right away if:  You have shortness of breath while resting.  You have shortness of breath that stops you from: ? Being able to talk. ? Doing normal activities.  Your chest hurts for longer than 5 minutes.  Your skin color is more blue than usual.  Your pulse oximeter shows that you have low oxygen for longer than 5 minutes.  You have a fever.  You feel too tired to breathe normally. Summary  Chronic obstructive pulmonary disease (COPD) is a long-term lung problem.  The way your  lungs work will never return to normal. Usually the condition gets worse over time. There are things you can do to keep yourself as healthy as possible.  Take over-the-counter and prescription medicines only as told by your doctor.  If you smoke, stop. Smoking makes the problem worse. This information is not intended to replace advice given to you by your health care provider. Make sure you discuss any questions you have with your health care provider. Document Released: 11/12/2007 Document Revised: 06/30/2016 Document Reviewed: 06/30/2016 Elsevier Interactive Patient Education  2019 [employer] Inc.  

## 2018-08-28 LAB — BMP8+EGFR
BUN/Creatinine Ratio: 9 — ABNORMAL LOW (ref 10–24)
BUN: 9 mg/dL (ref 8–27)
CHLORIDE: 97 mmol/L (ref 96–106)
CO2: 27 mmol/L (ref 20–29)
Calcium: 8.9 mg/dL (ref 8.6–10.2)
Creatinine, Ser: 0.95 mg/dL (ref 0.76–1.27)
GFR calc Af Amer: 93 mL/min/{1.73_m2} (ref >59)
GFR calc non Af Amer: 81 mL/min/{1.73_m2} (ref >59)
Glucose: 123 mg/dL — ABNORMAL HIGH (ref 65–99)
Potassium: 3.5 mmol/L (ref 3.5–5.2)
Sodium: 141 mmol/L (ref 134–144)

## 2018-09-21 ENCOUNTER — Other Ambulatory Visit: Payer: Self-pay | Admitting: Family

## 2018-09-21 DIAGNOSIS — E1142 Type 2 diabetes mellitus with diabetic polyneuropathy: Secondary | ICD-10-CM

## 2018-09-22 ENCOUNTER — Other Ambulatory Visit: Payer: Self-pay | Admitting: *Deleted

## 2018-09-22 DIAGNOSIS — J439 Emphysema, unspecified: Secondary | ICD-10-CM

## 2018-09-22 MED ORDER — FLUTICASONE FUROATE-VILANTEROL 100-25 MCG/INH IN AEPB: INHALATION_SPRAY | RESPIRATORY_TRACT | 5 refills | Status: DC

## 2018-10-15 ENCOUNTER — Encounter: Payer: Self-pay | Admitting: Cardiovascular Disease

## 2018-10-15 ENCOUNTER — Telehealth (INDEPENDENT_AMBULATORY_CARE_PROVIDER_SITE_OTHER): Payer: Medicare HMO | Admitting: Cardiovascular Disease

## 2018-10-15 DIAGNOSIS — I25118 Atherosclerotic heart disease of native coronary artery with other forms of angina pectoris: Secondary | ICD-10-CM | POA: Diagnosis not present

## 2018-10-15 DIAGNOSIS — I10 Essential (primary) hypertension: Secondary | ICD-10-CM

## 2018-10-15 DIAGNOSIS — Z955 Presence of coronary angioplasty implant and graft: Secondary | ICD-10-CM

## 2018-10-15 DIAGNOSIS — E785 Hyperlipidemia, unspecified: Secondary | ICD-10-CM

## 2018-10-15 DIAGNOSIS — Z720 Tobacco use: Secondary | ICD-10-CM

## 2018-10-15 MED ORDER — ASPIRIN EC 81 MG PO TBEC: 81.0000 mg | DELAYED_RELEASE_TABLET | Freq: Every day | ORAL | Status: DC

## 2018-10-15 NOTE — Patient Instructions (Signed)
Medication Instructions:   Begin Aspirin 81mg  daily.  Continue all other medications.    Labwork:  Lipids - order enclosed.  Reminder:  Nothing to eat or drink after 12 midnight prior to labs.  You can do your labs at your primary doctor office.    Testing/Procedures: none  Follow-Up: Your physician wants you to follow up in: 6 months.  You will receive a reminder letter in the mail one-two months in advance.  If you don't receive a letter, please call our office to schedule the follow up appointment   Any Other Special Instructions Will Be Listed Below (If Applicable).  If you need a refill on your cardiac medications before your next appointment, please call your pharmacy.

## 2018-10-15 NOTE — Progress Notes (Addendum)
Virtual Visit via Telephone Note   This visit type was conducted due to national recommendations for restrictions regarding the COVID-19 Pandemic (e.g. social distancing) in an effort to limit this patient's exposure and mitigate transmission in our community.  Due to his co-morbid illnesses, this patient is at least at moderate risk for complications without adequate follow up.  This format is felt to be most appropriate for this patient at this time.  The patient did not have access to video technology/had technical difficulties with video requiring transitioning to audio format only (telephone).  All issues noted in this document were discussed and addressed.  No physical exam could be performed with this format.  Please refer to the patient's chart for his  consent to telehealth for [facility].   Date:  10/15/2018   ID:  Ronald [name], DOB 1945/09/14, MRN [id number]  Patient Location: Home Provider Location: Home  PCP:  [name], FNP  Cardiologist:  Kate Sable, MD  Electrophysiologist:  None   Evaluation Performed:  New Patient  Chief Complaint:  CAD  History of Present Illness:    Ronald [name] is a 73 y.o. male with coronary artery disease. He underwent drug-eluting stent placement x 2 to the LAD for a non-STEMI in December 2015 at [facility]. He also has diabetes mellitus, hypertension, hyperlipidemia, ulcerative colitis (sees Dr. Laural Golden, GI), and a history of tobacco abuse.  Echocardiogram showed normal LV function and mild tricuspid regurgitation.  I have not evaluated him since January 2017.  He seldom has chest pains. Last used nitro 2 weeks ago. Denies palpitations. He had been having leg swelling but this has since resolved.   The patient does not have symptoms concerning for COVID-19 infection (fever, chills, cough, or new shortness of breath).    Past Medical History:  Diagnosis Date  . Anxiety   . Asthma   . Chronic lower back pain   .  COPD (chronic obstructive pulmonary disease) ([place])   . Coronary artery disease   . Depression   . GERD (gastroesophageal reflux disease)   . High cholesterol   . Hypertension   . NSTEMI (non-ST elevated myocardial infarction) ([place]) 05/2014   with stent placement  . TIA (transient ischemic attack)    "they say I've had some mini strokes; don't know when"; denies residual on 06/22/2014)  . Type II diabetes mellitus ([place])   . Ulcerative colitis [facility])    Past Surgical History:  Procedure Laterality Date  . APPENDECTOMY    . CARDIAC CATHETERIZATION  1990's X 3  . CHOLECYSTECTOMY    . CORONARY ANGIOPLASTY WITH STENT PLACEMENT  05/2014   "2"  . TUMOR EXCISION Right ~ 1999   "side of my upper head"     Current Meds  Medication Sig  . albuterol (PROVENTIL HFA;VENTOLIN HFA) 108 (90 Base) MCG/ACT inhaler Inhale into the lungs.  . ALPRAZolam (XANAX) 0.5 MG tablet Take 1 tablet (0.5 mg total) by mouth 3 (three) times daily as needed. for anxiety  . amLODipine (NORVASC) 5 MG tablet Take 1 tablet (5 mg total) by mouth daily.  Marland Kitchen atorvastatin (LIPITOR) 40 MG tablet TAKE 1 TABLET BY MOUTH DAILY AT 6pm FOR CHOLESTEROL  . fluticasone furoate-vilanterol (BREO ELLIPTA) 100-25 MCG/INH AEPB INHALE ONE PUFF INTO LUNGS DAILY FOR BREATHING  . furosemide (LASIX) 20 MG tablet Take 1 tablet (20 mg total) by mouth 2 (two) times daily.  Marland Kitchen glucose blood (ONE TOUCH ULTRA TEST) test strip USE TO CHECK  GLUCOSE ONCE DAILY  . hydrALAZINE (APRESOLINE) 10 MG tablet Take 1 tablet (10 mg total) by mouth 3 (three) times daily.  Marland Kitchen ipratropium-albuterol (DUONEB) 0.5-2.5 (3) MG/3ML SOLN USE ONE VIAL BY NEBULIZER FOUR TIMES DAILY  . Lancets (ONETOUCH ULTRASOFT) lancets Use as instructed   DX E11.9  . losartan (COZAAR) 50 MG tablet TAKE 1 TABLET BY MOUTH EVERY DAY FOR BLOOD PRESSURE  . metFORMIN (GLUCOPHAGE-XR) 500 MG 24 hr tablet TAKE 1 TABLET BY MOUTH EVERY DAY WITH BREAKFAST FOR BLOOD SUGAR  . metoprolol tartrate  (LOPRESSOR) 50 MG tablet Take 1 tablet (50 mg total) by mouth 2 (two) times daily.  . mirtazapine (REMERON) 15 MG tablet TAKE 1 TABLET BY MOUTH EVERY DAY FOR SLEEP  . nitroGLYCERIN (NITROSTAT) 0.4 MG SL tablet Place 1 tablet (0.4 mg total) under the tongue every 5 (five) minutes as needed for chest pain.  . pantoprazole (PROTONIX) 40 MG tablet Take 1 tablet (40 mg total) by mouth daily.  . VENTOLIN HFA 108 (90 Base) MCG/ACT inhaler INHALE TWO PUFFS EVERY 6 HOURS AS NEEDED     Allergies:   Gabapentin   Social History   Tobacco Use  . Smoking status: Current Every Day Smoker    Packs/day: 1.50    Years: 48.00    Pack years: 72.00    Types: Cigarettes    Start date: 02/12/1966    Last attempt to quit: 07/25/2016    Years since quitting: 2.2  . Smokeless tobacco: Never Used  Substance Use Topics  . Alcohol use: Yes    Alcohol/week: 0.0 standard drinks    Comment: "drank some when I was 18"  . Drug use: No     Family Hx: The patient's family history includes CAD in his father; Leukemia in his sister; Lung cancer in his brother.  ROS:   Please see the history of present illness.     All other systems reviewed and are negative.   Prior CV studies:   The following studies were reviewed today:  See above  Labs/Other Tests and Data Reviewed:    EKG:  No ECG reviewed.  Recent Labs: 07/21/2018: ALT 9; BNP 189.0; Hemoglobin 16.3; Platelets 390; TSH 1.360 08/27/2018: BUN 9; Creatinine, Ser 0.95; Potassium 3.5; Sodium 141   Recent Lipid Panel Lab Results  Component Value Date/Time   CHOL 188 11/19/2017 02:43 PM   TRIG 293 (H) 11/19/2017 02:43 PM   HDL 28 (L) 11/19/2017 02:43 PM   CHOLHDL 6.7 (H) 11/19/2017 02:43 PM   LDLCALC 101 (H) 11/19/2017 02:43 PM    Wt Readings from Last 3 Encounters:  08/27/18 180 lb (81.6 kg)  07/21/18 183 lb (83 kg)  11/19/17 176 lb (79.8 kg)     Objective:    Vital Signs:  There were no vitals taken for this visit.     ASSESSMENT & PLAN:     1. CAD s/p PCI to LAD for NSTEMI on 05/10/14: Symptomatically stable. Continue metoprolol and statin. I encouraged him to start taking 81 mg ASA daily. He stopped on his own.  2. Essential HTN: Will need continued monitoring.  3. Hyperlipidemia: Continue atorvastatin 40 mg. LDL 101 on 11/19/17. I will repeat lipids. If LDL remains elevated, I will increase Lipitor to 80 mg.  4. Tobacco abuse: Smokes 1 ppd. Extensive cessation counseling previously provided.   COVID-19 Education: The signs and symptoms of COVID-19 were discussed with the patient and how to seek care for testing (follow up with PCP or arrange  E-visit).  The importance of social distancing was discussed today.  Time:   Today, I have spent 30 minutes with the patient with telehealth technology discussing the above problems.     Medication Adjustments/Labs and Tests Ordered: Current medicines are reviewed at length with the patient today.  Concerns regarding medicines are outlined above.   Tests Ordered: No orders of the defined types were placed in this encounter.   Medication Changes: No orders of the defined types were placed in this encounter.   Disposition:  Follow up 6 months  Signed, Kate Sable, MD  10/15/2018 1:05 PM    [facility]

## 2018-10-15 NOTE — Addendum Note (Signed)
Addended by: Laurine Blazer on: 10/15/2018 01:57 PM   Modules accepted: Orders

## 2018-10-21 ENCOUNTER — Other Ambulatory Visit: Payer: Self-pay | Admitting: Family

## 2018-10-21 DIAGNOSIS — E785 Hyperlipidemia, unspecified: Secondary | ICD-10-CM

## 2018-10-21 DIAGNOSIS — I152 Hypertension secondary to endocrine disorders: Secondary | ICD-10-CM

## 2018-10-21 DIAGNOSIS — E1159 Type 2 diabetes mellitus with other circulatory complications: Secondary | ICD-10-CM

## 2018-10-25 ENCOUNTER — Other Ambulatory Visit: Payer: Self-pay | Admitting: Family

## 2018-11-23 ENCOUNTER — Other Ambulatory Visit: Payer: Self-pay | Admitting: Family

## 2018-11-23 DIAGNOSIS — E1159 Type 2 diabetes mellitus with other circulatory complications: Secondary | ICD-10-CM

## 2018-11-23 DIAGNOSIS — I152 Hypertension secondary to endocrine disorders: Secondary | ICD-10-CM

## 2018-11-25 ENCOUNTER — Other Ambulatory Visit: Payer: Self-pay

## 2018-11-26 ENCOUNTER — Encounter: Payer: Self-pay | Admitting: Family

## 2018-11-26 ENCOUNTER — Ambulatory Visit (INDEPENDENT_AMBULATORY_CARE_PROVIDER_SITE_OTHER): Payer: Medicare HMO | Admitting: Family

## 2018-11-26 VITALS — BP 168/92 | HR 84 | Temp 97.5°F | Ht 69.0 in | Wt 185.0 lb

## 2018-11-26 DIAGNOSIS — K219 Gastro-esophageal reflux disease without esophagitis: Secondary | ICD-10-CM

## 2018-11-26 DIAGNOSIS — I509 Heart failure, unspecified: Secondary | ICD-10-CM

## 2018-11-26 DIAGNOSIS — F331 Major depressive disorder, recurrent, moderate: Secondary | ICD-10-CM

## 2018-11-26 DIAGNOSIS — I152 Hypertension secondary to endocrine disorders: Secondary | ICD-10-CM

## 2018-11-26 DIAGNOSIS — I2510 Atherosclerotic heart disease of native coronary artery without angina pectoris: Secondary | ICD-10-CM | POA: Diagnosis not present

## 2018-11-26 DIAGNOSIS — J439 Emphysema, unspecified: Secondary | ICD-10-CM | POA: Diagnosis not present

## 2018-11-26 DIAGNOSIS — E1142 Type 2 diabetes mellitus with diabetic polyneuropathy: Secondary | ICD-10-CM

## 2018-11-26 DIAGNOSIS — I890 Lymphedema, not elsewhere classified: Secondary | ICD-10-CM

## 2018-11-26 DIAGNOSIS — R69 Illness, unspecified: Secondary | ICD-10-CM | POA: Diagnosis not present

## 2018-11-26 DIAGNOSIS — E785 Hyperlipidemia, unspecified: Secondary | ICD-10-CM

## 2018-11-26 DIAGNOSIS — E1159 Type 2 diabetes mellitus with other circulatory complications: Secondary | ICD-10-CM | POA: Diagnosis not present

## 2018-11-26 DIAGNOSIS — I10 Essential (primary) hypertension: Secondary | ICD-10-CM

## 2018-11-26 DIAGNOSIS — F419 Anxiety disorder, unspecified: Secondary | ICD-10-CM

## 2018-11-26 DIAGNOSIS — E1169 Type 2 diabetes mellitus with other specified complication: Secondary | ICD-10-CM | POA: Diagnosis not present

## 2018-11-26 DIAGNOSIS — Z720 Tobacco use: Secondary | ICD-10-CM

## 2018-11-26 LAB — BAYER DCA HB A1C WAIVED: HB A1C (BAYER DCA - WAIVED): 6.7 % (ref <7.0)

## 2018-11-26 NOTE — Patient Instructions (Signed)

## 2018-11-26 NOTE — Progress Notes (Signed)
Subjective:    Patient ID: Ronald [name], male    DOB: [birth year], 72 y.o.   MRN: [id number]  Chief Complaint  Patient presents with  . Medical Management of Chronic Issues  . Diabetes   PT presents to the office today for chronic follow up. PT is followed by Cardiologists. Pt's BP is elevated today, states he has not taken his medications today. He is noncompliant with his medications and appointments. He can not read and this does make his medication adherence difficult at times. His ex-wife does his medications for him.  Diabetes He presents for his follow-up diabetic visit. He has type 2 diabetes mellitus. His disease course has been worsening. Hypoglycemia symptoms include nervousness/anxiousness. Associated symptoms include fatigue and foot paresthesias. Pertinent negatives for diabetes include no blurred vision and no visual change. There are no hypoglycemic complications. Symptoms are improving. Diabetic complications include heart disease. Risk factors for coronary artery disease include diabetes mellitus, dyslipidemia, family history, male sex, hypertension, sedentary lifestyle, tobacco exposure and stress. He is following a generally unhealthy diet. (Does not check his BS)  Hypertension This is a chronic problem. The current episode started more than 1 year ago. The problem has been waxing and waning since onset. The problem is uncontrolled. Associated symptoms include anxiety, malaise/fatigue, palpitations, peripheral edema and shortness of breath. Pertinent negatives include no blurred vision. Risk factors for coronary artery disease include diabetes mellitus, dyslipidemia, obesity, male gender and smoking/tobacco exposure. The current treatment provides mild improvement. Hypertensive end-organ damage includes CAD/MI.  Congestive Heart Failure Presents for follow-up visit. Associated symptoms include edema, fatigue, palpitations and shortness of breath. The symptoms have been worsening.   Hyperlipidemia This is a chronic problem. The current episode started more than 1 year ago. The problem is uncontrolled. Recent lipid tests were reviewed and are high. Exacerbating diseases include obesity. Factors aggravating his hyperlipidemia include smoking and fatty foods. Associated symptoms include shortness of breath. Current antihyperlipidemic treatment includes statins. The current treatment provides moderate improvement of lipids. Risk factors for coronary artery disease include dyslipidemia, diabetes mellitus, male sex, hypertension, a sedentary lifestyle and post-menopausal.  Anxiety Presents for follow-up visit. Symptoms include decreased concentration, excessive worry, insomnia, irritability, nervous/anxious behavior, palpitations, restlessness and shortness of breath. Symptoms occur constantly. The severity of symptoms is moderate.    Insomnia Primary symptoms: difficulty falling asleep, frequent awakening, malaise/fatigue.  The current episode started more than one year. The onset quality is gradual. The problem occurs intermittently.  Gastroesophageal Reflux He complains of heartburn and a hoarse voice. He reports no belching. This is a chronic problem. The current episode started more than 1 year ago. The problem occurs occasionally. The problem has been waxing and waning. Associated symptoms include fatigue. He has tried a PPI for the symptoms. The treatment provided moderate relief.      Review of Systems  Constitutional: Positive for fatigue, irritability and malaise/fatigue.  HENT: Positive for hoarse voice.   Eyes: Negative for blurred vision.  Respiratory: Positive for shortness of breath.   Cardiovascular: Positive for palpitations.  Gastrointestinal: Positive for heartburn.  Psychiatric/Behavioral: Positive for decreased concentration. The patient is nervous/anxious and has insomnia.   All other systems reviewed and are negative.      Objective:   Physical Exam  Vitals signs reviewed.  Constitutional:      General: He is not in acute distress.    Appearance: He is well-developed.  HENT:     Head: Normocephalic.     Right Ear:  External ear normal.     Left Ear: External ear normal.  Eyes:     General:        Right eye: No discharge.        Left eye: No discharge.     Pupils: Pupils are equal, round, and reactive to light.  Neck:     Musculoskeletal: Normal range of motion and neck supple.     Thyroid: No thyromegaly.  Cardiovascular:     Rate and Rhythm: Normal rate and regular rhythm.     Heart sounds: Normal heart sounds. No murmur.  Pulmonary:     Effort: Pulmonary effort is normal. No respiratory distress.     Breath sounds: Normal breath sounds. No wheezing.  Abdominal:     General: Bowel sounds are normal. There is no distension.     Palpations: Abdomen is soft.     Tenderness: There is no abdominal tenderness.  Musculoskeletal:        General: No tenderness.     Comments: Lymphedema present in bilateral arms   Skin:    General: Skin is warm and dry.     Coloration: Skin is ashen and pale.     Findings: No erythema or rash.  Neurological:     Mental Status: He is alert and oriented to person, place, and time.     Cranial Nerves: No cranial nerve deficit.     Deep Tendon Reflexes: Reflexes are normal and symmetric.  Psychiatric:        Behavior: Behavior normal.        Thought Content: Thought content normal.        Judgment: Judgment normal.       BP (!) 168/92   Pulse 84   Temp (!) 97.5 F (36.4 C) (Oral)   Ht _0  (1.753 m)   Wt 185 lb (83.9 kg)   BMI 27.32 kg/m      Assessment & Plan:  Ronald [name] comes in today with chief complaint of Medical Management of Chronic Issues and Diabetes   Diagnosis and orders addressed:  1. Hypertension associated with diabetes ([place]) -PT will return to the office in 2 weeks to recheck and he will take his medications before the appointment - CMP14+EGFR - CBC with  Differential/Platelet  2. Congestive heart failure, unspecified HF chronicity, unspecified heart failure type ([place]) - CMP14+EGFR - CBC with Differential/Platelet  3. Type 2 diabetes mellitus with diabetic polyneuropathy, without long-term current use of insulin (HCC) - CMP14+EGFR - Bayer DCA Hb A1c Waived - Microalbumin / creatinine urine ratio - CBC with Differential/Platelet - Thyroid Panel With TSH  4. Gastroesophageal reflux disease, esophagitis presence not specified - CMP14+EGFR - CBC with Differential/Platelet  5. Pulmonary emphysema, unspecified emphysema type ([place]) -Will do referral to Pumonology   for COPD and "Prominence of the main pulmonary arteries suggests pulmonary" seen on CT chest arterial hypertension - CMP14+EGFR - CBC with Differential/Platelet - Ambulatory referral to Pulmonology  6. Tobacco abuse Smoking cessation discussed  - CMP14+EGFR - CBC with Differential/Platelet  7. Moderate episode of recurrent major depressive disorder (HCC) - CMP14+EGFR - CBC with Differential/Platelet  8. Anxiety - CMP14+EGFR - CBC with Differential/Platelet  9. CAD in native artery - CMP14+EGFR - CBC with Differential/Platelet  10. Hyperlipidemia associated with type 2 diabetes mellitus (HCC) - CMP14+EGFR - Lipid panel - CBC with Differential/Platelet  11. Lymphedema - CMP14+EGFR - CBC with Differential/Platelet - Thyroid Panel With TSH   Labs pending Keep Cardiologists follow up  appointment  Health Maintenance reviewed Diet and exercise encouraged  Follow up plan: 2 weeks to recheck HTN   [name], FNP

## 2018-11-27 LAB — CMP14+EGFR
ALT: 9 IU/L (ref 0–44)
AST: 12 IU/L (ref 0–40)
Albumin/Globulin Ratio: 1.4 (ref 1.2–2.2)
Albumin: 4 g/dL (ref 3.8–4.8)
Alkaline Phosphatase: 113 IU/L (ref 39–117)
BUN/Creatinine Ratio: 12 (ref 10–24)
BUN: 11 mg/dL (ref 8–27)
Bilirubin Total: 0.5 mg/dL (ref 0.0–1.2)
CO2: 28 mmol/L (ref 20–29)
Calcium: 9 mg/dL (ref 8.6–10.2)
Chloride: 96 mmol/L (ref 96–106)
Creatinine, Ser: 0.92 mg/dL (ref 0.76–1.27)
GFR calc Af Amer: 97 mL/min/{1.73_m2} (ref >59)
GFR calc non Af Amer: 84 mL/min/{1.73_m2} (ref >59)
Globulin, Total: 2.9 g/dL (ref 1.5–4.5)
Glucose: 154 mg/dL — ABNORMAL HIGH (ref 65–99)
Potassium: 3.4 mmol/L — ABNORMAL LOW (ref 3.5–5.2)
Sodium: 141 mmol/L (ref 134–144)
Total Protein: 6.9 g/dL (ref 6.0–8.5)

## 2018-11-27 LAB — LIPID PANEL
Chol/HDL Ratio: 4.5 ratio (ref 0.0–5.0)
Cholesterol, Total: 131 mg/dL (ref 100–199)
HDL: 29 mg/dL — ABNORMAL LOW (ref >39)
LDL Calculated: 66 mg/dL (ref 0–99)
Triglycerides: 181 mg/dL — ABNORMAL HIGH (ref 0–149)
VLDL Cholesterol Cal: 36 mg/dL (ref 5–40)

## 2018-11-27 LAB — CBC WITH DIFFERENTIAL/PLATELET
Basophils Absolute: 0 10*3/uL (ref 0.0–0.2)
Basos: 1 %
EOS (ABSOLUTE): 0.2 10*3/uL (ref 0.0–0.4)
Eos: 2 %
Hematocrit: 44.5 % (ref 37.5–51.0)
Hemoglobin: 15.2 g/dL (ref 13.0–17.7)
Immature Grans (Abs): 0 10*3/uL (ref 0.0–0.1)
Immature Granulocytes: 1 %
Lymphocytes Absolute: 1.5 10*3/uL (ref 0.7–3.1)
Lymphs: 18 %
MCH: 27.9 pg (ref 26.6–33.0)
MCHC: 34.2 g/dL (ref 31.5–35.7)
MCV: 82 fL (ref 79–97)
Monocytes Absolute: 0.7 10*3/uL (ref 0.1–0.9)
Monocytes: 9 %
Neutrophils Absolute: 6 10*3/uL (ref 1.4–7.0)
Neutrophils: 69 %
Platelets: 389 10*3/uL (ref 150–450)
RBC: 5.44 x10E6/uL (ref 4.14–5.80)
RDW: 13.6 % (ref 11.6–15.4)
WBC: 8.5 10*3/uL (ref 3.4–10.8)

## 2018-11-27 LAB — THYROID PANEL WITH TSH
Free Thyroxine Index: 2.2 (ref 1.2–4.9)
T3 Uptake Ratio: 27 % (ref 24–39)
T4, Total: 8.2 ug/dL (ref 4.5–12.0)
TSH: 1.05 u[IU]/mL (ref 0.450–4.500)

## 2018-11-27 LAB — MICROALBUMIN / CREATININE URINE RATIO
Creatinine, Urine: 253.8 mg/dL
Microalb/Creat Ratio: 12 mg/g creat (ref 0–29)
Microalbumin, Urine: 30.4 ug/mL

## 2018-12-11 ENCOUNTER — Other Ambulatory Visit: Payer: Self-pay | Admitting: Family

## 2018-12-11 DIAGNOSIS — F32A Depression, unspecified: Secondary | ICD-10-CM

## 2018-12-11 DIAGNOSIS — F329 Major depressive disorder, single episode, unspecified: Secondary | ICD-10-CM

## 2018-12-15 ENCOUNTER — Other Ambulatory Visit: Payer: Self-pay

## 2018-12-16 ENCOUNTER — Ambulatory Visit (INDEPENDENT_AMBULATORY_CARE_PROVIDER_SITE_OTHER): Payer: Medicare HMO | Admitting: Family

## 2018-12-16 ENCOUNTER — Encounter: Payer: Self-pay | Admitting: Family

## 2018-12-16 DIAGNOSIS — F17200 Nicotine dependence, unspecified, uncomplicated: Secondary | ICD-10-CM

## 2018-12-16 DIAGNOSIS — R69 Illness, unspecified: Secondary | ICD-10-CM | POA: Diagnosis not present

## 2018-12-16 DIAGNOSIS — J439 Emphysema, unspecified: Secondary | ICD-10-CM | POA: Diagnosis not present

## 2018-12-16 DIAGNOSIS — E1159 Type 2 diabetes mellitus with other circulatory complications: Secondary | ICD-10-CM | POA: Diagnosis not present

## 2018-12-16 DIAGNOSIS — I10 Essential (primary) hypertension: Secondary | ICD-10-CM

## 2018-12-16 DIAGNOSIS — I152 Hypertension secondary to endocrine disorders: Secondary | ICD-10-CM

## 2018-12-16 MED ORDER — BREO ELLIPTA 100-25 MCG/INH IN AEPB: INHALATION_SPRAY | RESPIRATORY_TRACT | 5 refills | Status: DC

## 2018-12-16 NOTE — Progress Notes (Signed)
   Virtual Visit via telephone Note  I connected with Ronald [name] on 12/16/18 at 4:38 pm by telephone and verified that I am speaking with the correct person using two identifiers. Ronald [name] is currently located at driving and friend is currently with her during visit. The provider, [name], FNP is located in their office at time of visit.  I discussed the limitations, risks, security and privacy concerns of performing an evaluation and management service by telephone and the availability of in person appointments. I also discussed with the patient that there may be a patient responsible charge related to this service. The patient expressed understanding and agreed to proceed.   History and Present Illness:  Pt calls the office today to recheck HTN.  Hypertension This is a chronic problem. The current episode started more than 1 year ago. The problem has been waxing and waning since onset. Associated symptoms include headaches, malaise/fatigue, peripheral edema and shortness of breath. Past treatments include calcium channel blockers, angiotensin blockers and beta blockers. The current treatment provides mild improvement.  COPD He continues to smoke at least a pack a day. He uses the [place] daily.     Review of Systems  Constitutional: Positive for malaise/fatigue.  Respiratory: Positive for shortness of breath.   Neurological: Positive for headaches.  All other systems reviewed and are negative.    Observations/Objective: No SOB or distress   Assessment and Plan: 1. Pulmonary emphysema, unspecified emphysema type ([place]) Will refill Breo  Smoking cessation discussed   - fluticasone furoate-vilanterol (BREO ELLIPTA) 100-25 MCG/INH AEPB; INHALE ONE PUFF INTO LUNGS DAILY FOR BREATHING  Dispense: 60 each; Refill: 5  2. Hypertension associated with diabetes ([place]) Continue medications  He will return to office and get BP checked -Dash diet information given -Exercise  encouraged - Stress Management  -Continue current meds  3. Current smoker Smoking cessation discussed  I discussed the assessment and treatment plan with the patient. The patient was provided an opportunity to ask questions and all were answered. The patient agreed with the plan and demonstrated an understanding of the instructions.   The patient was advised to call back or seek an in-person evaluation if the symptoms worsen or if the condition fails to improve as anticipated.  The above assessment and management plan was discussed with the patient. The patient verbalized understanding of and has agreed to the management plan. Patient is aware to call the clinic if symptoms persist or worsen. Patient is aware when to return to the clinic for a follow-up visit. Patient educated on when it is appropriate to go to the emergency department.   Time call ended:  4:55 pm  I provided 17 minutes of non-face-to-face time during this encounter.    [name], FNP

## 2018-12-21 ENCOUNTER — Other Ambulatory Visit: Payer: Self-pay | Admitting: Family

## 2018-12-23 ENCOUNTER — Telehealth: Payer: Self-pay | Admitting: Family

## 2018-12-23 DIAGNOSIS — I10 Essential (primary) hypertension: Secondary | ICD-10-CM

## 2018-12-23 DIAGNOSIS — I152 Hypertension secondary to endocrine disorders: Secondary | ICD-10-CM

## 2018-12-23 DIAGNOSIS — I509 Heart failure, unspecified: Secondary | ICD-10-CM

## 2018-12-23 DIAGNOSIS — E1142 Type 2 diabetes mellitus with diabetic polyneuropathy: Secondary | ICD-10-CM

## 2018-12-23 DIAGNOSIS — E785 Hyperlipidemia, unspecified: Secondary | ICD-10-CM

## 2018-12-23 DIAGNOSIS — J439 Emphysema, unspecified: Secondary | ICD-10-CM

## 2018-12-23 DIAGNOSIS — F329 Major depressive disorder, single episode, unspecified: Secondary | ICD-10-CM

## 2018-12-23 DIAGNOSIS — E1159 Type 2 diabetes mellitus with other circulatory complications: Secondary | ICD-10-CM

## 2018-12-23 DIAGNOSIS — F32A Depression, unspecified: Secondary | ICD-10-CM

## 2018-12-23 DIAGNOSIS — K219 Gastro-esophageal reflux disease without esophagitis: Secondary | ICD-10-CM

## 2018-12-23 DIAGNOSIS — G4700 Insomnia, unspecified: Secondary | ICD-10-CM

## 2018-12-23 DIAGNOSIS — R0602 Shortness of breath: Secondary | ICD-10-CM

## 2018-12-23 DIAGNOSIS — M7989 Other specified soft tissue disorders: Secondary | ICD-10-CM

## 2018-12-23 MED ORDER — PANTOPRAZOLE SODIUM 40 MG PO TBEC: 40.0000 mg | DELAYED_RELEASE_TABLET | Freq: Every day | ORAL | 0 refills | Status: DC

## 2018-12-23 MED ORDER — MIRTAZAPINE 15 MG PO TABS: ORAL_TABLET | ORAL | 1 refills | Status: DC

## 2018-12-23 MED ORDER — METOPROLOL TARTRATE 50 MG PO TABS: ORAL_TABLET | ORAL | 1 refills | Status: DC

## 2018-12-23 MED ORDER — ATORVASTATIN CALCIUM 40 MG PO TABS: 40.0000 mg | ORAL_TABLET | Freq: Every day | ORAL | 0 refills | Status: DC

## 2018-12-23 MED ORDER — METFORMIN HCL ER 500 MG PO TB24: ORAL_TABLET | ORAL | 0 refills | Status: DC

## 2018-12-23 MED ORDER — FUROSEMIDE 20 MG PO TABS: 20.0000 mg | ORAL_TABLET | Freq: Two times a day (BID) | ORAL | 3 refills | Status: DC

## 2018-12-23 MED ORDER — AMLODIPINE BESYLATE 5 MG PO TABS: 5.0000 mg | ORAL_TABLET | Freq: Every day | ORAL | 3 refills | Status: DC

## 2018-12-23 MED ORDER — ALPRAZOLAM 0.5 MG PO TABS: 0.5000 mg | ORAL_TABLET | Freq: Three times a day (TID) | ORAL | 4 refills | Status: DC | PRN

## 2018-12-23 MED ORDER — HYDRALAZINE HCL 10 MG PO TABS: 10.0000 mg | ORAL_TABLET | Freq: Three times a day (TID) | ORAL | 1 refills | Status: DC

## 2018-12-23 MED ORDER — BREO ELLIPTA 100-25 MCG/INH IN AEPB: INHALATION_SPRAY | RESPIRATORY_TRACT | 5 refills | Status: DC

## 2018-12-23 MED ORDER — LOSARTAN POTASSIUM 50 MG PO TABS: ORAL_TABLET | ORAL | 1 refills | Status: DC

## 2018-12-23 NOTE — Telephone Encounter (Signed)
Please review and advise.

## 2018-12-23 NOTE — Telephone Encounter (Signed)
Prescription sent to pharmacy.

## 2018-12-24 ENCOUNTER — Telehealth: Payer: Self-pay | Admitting: Family

## 2018-12-24 ENCOUNTER — Ambulatory Visit (INDEPENDENT_AMBULATORY_CARE_PROVIDER_SITE_OTHER): Payer: Medicare HMO | Admitting: *Deleted

## 2018-12-24 VITALS — BP 147/81 | HR 71 | Ht 69.0 in | Wt 185.0 lb

## 2018-12-24 DIAGNOSIS — Z0000 Encounter for general adult medical examination without abnormal findings: Secondary | ICD-10-CM

## 2018-12-24 DIAGNOSIS — F32A Depression, unspecified: Secondary | ICD-10-CM

## 2018-12-24 DIAGNOSIS — F419 Anxiety disorder, unspecified: Secondary | ICD-10-CM

## 2018-12-24 DIAGNOSIS — F329 Major depressive disorder, single episode, unspecified: Secondary | ICD-10-CM

## 2018-12-24 MED ORDER — ALPRAZOLAM 0.5 MG PO TABS: 0.5000 mg | ORAL_TABLET | Freq: Three times a day (TID) | ORAL | 4 refills | Status: DC | PRN

## 2018-12-24 NOTE — Telephone Encounter (Signed)
Pt reviewed in  controlled database, he has not picked rx at any other pharmacy. We will resend today.

## 2018-12-24 NOTE — Progress Notes (Signed)
MEDICARE ANNUAL WELLNESS VISIT  12/24/2018  Telephone Visit Disclaimer This Medicare AWV was conducted by telephone due to national recommendations for restrictions regarding the COVID-19 Pandemic (e.g. social distancing).  I verified, using two identifiers, that I am speaking with Ronald [name] or their authorized healthcare agent. I discussed the limitations, risks, security, and privacy concerns of performing an evaluation and management service by telephone and the potential availability of an in-person appointment in the future. The patient expressed understanding and agreed to proceed.   Subjective:  Ronald [name] is a 73 y.o. male patient of Hawks, Theador Hawthorne, FNP who had a Medicare Annual Wellness Visit today via telephone. Taryn is Retired and lives with a friend. he has 2 children. he reports that he is socially active and does interact with friends/family regularly. he is not physically active since getting out of the hospital.  Patient Care Team: [name], FNP as PCP - General (Family Medicine) Herminio Commons, MD as PCP - Cardiology (Cardiology)  Advanced Directives 12/24/2018 08/23/2016 07/12/2014 06/22/2014  Does Patient Have a Medical Advance Directive? No No No No  Would patient like information on creating a medical advance directive? No - Patient declined - No - patient declined information No - patient declined information    Hospital Utilization Over the Past 12 Months: # of hospitalizations or ER visits: 1 # of surgeries: 0  Review of Systems    Patient reports that his overall health is better compared to last year.  Patient Reported Readings (BP, Pulse, CBG, Weight, etc) BP (!) 147/81   Pulse 71   Ht 5\' 9"  (1.753 m)   Wt 185 lb (83.9 kg)   BMI 27.32 kg/m    Review of Systems: General ROS: negative  All other systems negative.  Pain Assessment       Current Medications & Allergies (verified) Allergies as of 12/24/2018      Reactions   Gabapentin Anxiety   Unknown reaction      Medication List       Accurate as of December 24, 2018  1:49 PM. If you have any questions, ask your nurse or doctor.        STOP taking these medications   metFORMIN 500 MG 24 hr tablet Commonly known as: GLUCOPHAGE-XR     TAKE these medications   ALPRAZolam 0.5 MG tablet Commonly known as: XANAX Take 1 tablet (0.5 mg total) by mouth 3 (three) times daily as needed. for anxiety   amLODipine 5 MG tablet Commonly known as: NORVASC Take 1 tablet (5 mg total) by mouth daily.   aspirin EC 81 MG tablet Take 1 tablet (81 mg total) by mouth daily.   atorvastatin 40 MG tablet Commonly known as: LIPITOR Take 1 tablet (40 mg total) by mouth daily at 6 PM. For cholesterol (Please have labwork done)   Breo Ellipta 100-25 MCG/INH Aepb Generic drug: fluticasone furoate-vilanterol INHALE ONE PUFF INTO LUNGS DAILY FOR BREATHING   furosemide 20 MG tablet Commonly known as: LASIX Take 1 tablet (20 mg total) by mouth 2 (two) times daily.   glucose blood test strip Commonly known as: ONE TOUCH ULTRA TEST USE TO CHECK GLUCOSE ONCE DAILY   hydrALAZINE 10 MG tablet Commonly known as: APRESOLINE Take 1 tablet (10 mg total) by mouth 3 (three) times daily.   ipratropium-albuterol 0.5-2.5 (3) MG/3ML Soln Commonly known as: DUONEB USE ONE VIAL BY NEBULIZER FOUR TIMES DAILY   losartan 50 MG tablet  Commonly known as: COZAAR TAKE 1 TABLET BY MOUTH EVERY DAY FOR BLOOD PRESSURE   metoprolol tartrate 50 MG tablet Commonly known as: LOPRESSOR TAKE 1 TABLET BY MOUTH TWICE DAILY FOR BLOOD PRESSURE   mirtazapine 15 MG tablet Commonly known as: REMERON TAKE 1 TABLET BY MOUTH EVERY DAY FOR SLEEP   nitroGLYCERIN 0.4 MG SL tablet Commonly known as: NITROSTAT DISSOLVE TAKE 1 TABLET UNDER THE TONGUE EVERY FIVE MINUTES AS NEEDED FOR CHEST PAIN   onetouch ultrasoft lancets Use as instructed   DX E11.9   pantoprazole 40 MG tablet Commonly known as:  PROTONIX Take 1 tablet (40 mg total) by mouth daily.   Ventolin HFA 108 (90 Base) MCG/ACT inhaler Generic drug: albuterol INHALE TWO PUFFS EVERY 6 HOURS AS NEEDED       History (reviewed): Past Medical History:  Diagnosis Date  . Anxiety   . Asthma   . Chronic lower back pain   . COPD (chronic obstructive pulmonary disease) ([place])   . Coronary artery disease   . Depression   . GERD (gastroesophageal reflux disease)   . High cholesterol   . Hypertension   . NSTEMI (non-ST elevated myocardial infarction) ([place]) 05/2014   with stent placement  . Stroke ([place])    anyeusym   . TIA (transient ischemic attack)    "they say I've had some mini strokes; don't know when"; denies residual on 06/22/2014)  . Type II diabetes mellitus ([place])   . Ulcerative colitis [facility])    Past Surgical History:  Procedure Laterality Date  . APPENDECTOMY    . CARDIAC CATHETERIZATION  1990's X 3  . CHOLECYSTECTOMY    . CORONARY ANGIOPLASTY WITH STENT PLACEMENT  05/2014   "2"  . TUMOR EXCISION Right ~ 1999   "side of my upper head"   Family History  Problem Relation Age of Onset  . CAD Father   . Lung cancer Brother        smoked  . Cancer Brother        lung  . Leukemia Sister   . Dementia Sister   . Stroke Mother   . Emphysema Sister   . Cancer Brother        lung   Social History   Socioeconomic History  . Marital status: Divorced    Spouse name: Not on file  . Number of children: 2  . Years of education: Not on file  . Highest education level: Not on file  Occupational History  . Occupation: Retired    Comment: Engineer, manufacturing  . Financial resource strain: Not on file  . Food insecurity    Worry: Not on file    Inability: Not on file  . Transportation needs    Medical: Not on file    Non-medical: Not on file  Tobacco Use  . Smoking status: Current Every Day Smoker    Packs/day: 1.50    Years: 48.00    Pack years: 72.00    Types: Cigarettes    Start date: 02/12/1966     Last attempt to quit: 07/25/2016    Years since quitting: 2.4  . Smokeless tobacco: Never Used  Substance and Sexual Activity  . Alcohol use: Not Currently    Alcohol/week: 0.0 standard drinks  . Drug use: No  . Sexual activity: Never  Lifestyle  . Physical activity    Days per week: Not on file    Minutes per session: Not on file  . Stress: Not on  file  Relationships  . Social Herbalist on phone: Not on file    Gets together: Not on file    Attends religious service: Not on file    Active member of club or organization: Not on file    Attends meetings of clubs or organizations: Not on file    Relationship status: Not on file  Other Topics Concern  . Not on file  Social History Narrative   Staying with a friend .   Has 2 children    Divorced     Activities of Daily Living In your present state of health, do you have any difficulty performing the following activities: 12/24/2018  Hearing? Y  Vision? Y  Difficulty concentrating or making decisions? N  Walking or climbing stairs? N  Dressing or bathing? N  Doing errands, shopping? Y  Preparing Food and eating ? N  Using the Toilet? N  In the past six months, have you accidently leaked urine? N  Do you have problems with loss of bowel control? N  Managing your Medications? Y  Managing your Finances? Y  Housekeeping or managing your Housekeeping? N  Some recent data might be hidden    Patient Education/ Literacy    Exercise Current Exercise Habits: The patient does not participate in regular exercise at present, Exercise limited by: neurologic condition(s)  Diet Patient reports consuming 1 meals a day and 2 snack(s) a day Patient reports that his primary diet is: Regular Patient reports that she does have regular access to food.   Depression Screen PHQ 2/9 Scores 12/24/2018 11/26/2018 08/27/2018 07/21/2018 11/19/2017 05/05/2017 05/05/2017  PHQ - 2 Score 0 0 0 1 1 2 1   PHQ- 9 Score - - - - - 16 -      Fall Risk Fall Risk  12/24/2018 11/26/2018 08/27/2018 07/21/2018 11/19/2017  Falls in the past year? 0 0 0 0 Yes  Number falls in past yr: - - - - -  Injury with Fall? - - - - -  Comment - - - - -     Objective:  Ronald [name] seemed alert and oriented and he participated appropriately during our telephone visit.  Blood Pressure Weight BMI  BP Readings from Last 3 Encounters:  12/24/18 (!) 147/81  11/26/18 (!) 168/92  08/27/18 (!) 147/81   Wt Readings from Last 3 Encounters:  12/24/18 185 lb (83.9 kg)  11/26/18 185 lb (83.9 kg)  08/27/18 180 lb (81.6 kg)   BMI Readings from Last 1 Encounters:  12/24/18 27.32 kg/m    *Unable to obtain current vital signs, weight, and BMI due to telephone visit type  Hearing/Vision  . Jeet did not seem to have difficulty with hearing/understanding during the telephone conversation . Reports that he has not had a formal eye exam by an eye care professional within the past year . Reports that he has not had a formal hearing evaluation within the past year *Unable to fully assess hearing and vision during telephone visit type  Cognitive Function: 6CIT Screen 12/24/2018  What Year? 4 points  What month? 0 points  What time? 0 points  Count back from 20 0 points  Months in reverse 2 points  Repeat phrase 10 points  Total Score 16   (Normal:0-7, Significant for Dysfunction: >8)  Normal Cognitive Function Screening: Yes   Immunization & Health Maintenance Record Immunization History  Administered Date(s) Administered  . Influenza Split 03/09/2014  . Influenza-Unspecified 08/20/2016  . Pneumococcal  Conjugate-13 11/19/2017  . Pneumococcal Polysaccharide-23 08/20/2016    Health Maintenance  Topic Date Due  . OPHTHALMOLOGY EXAM  02/12/1958  . TETANUS/TDAP  02/13/1967  . COLON CANCER SCREENING 5 YEAR SIGMOIDOSCOPY  02/12/1998  . COLONOSCOPY  02/12/1998  . FOOT EXAM  01/16/2018  . INFLUENZA VACCINE  01/08/2019  . HEMOGLOBIN A1C  05/28/2019   . Hepatitis C Screening  Completed  . PNA vac Low Risk Adult  Completed       Assessment  This is a routine wellness examination for Ronald [name].  Health Maintenance: Due or Overdue Health Maintenance Due  Topic Date Due  . OPHTHALMOLOGY EXAM  02/12/1958  . TETANUS/TDAP  02/13/1967  . COLON CANCER SCREENING 5 YEAR SIGMOIDOSCOPY  02/12/1998  . COLONOSCOPY  02/12/1998  . FOOT EXAM  01/16/2018    Ronald [name] does not need a referral for Community Assistance: Care Management:   no Social Work:    no Prescription Assistance:  no Nutrition/Diabetes Education:  no   Plan:  Personalized Goals Goals Addressed   None    Personalized Health Maintenance & Screening Recommendations  Td vaccine  Lung Cancer Screening Recommended: no (Low Dose CT Chest recommended if Age 81-80 years, 30 pack-year currently smoking OR have quit w/in past 15 years) Hepatitis C Screening recommended: no HIV Screening recommended: no  Advanced Directives: Written information was not prepared per patient's request.  Referrals & Orders No orders of the defined types were placed in this encounter.   Follow-up Plan . Follow-up with [name], FNP as planned    I have personally reviewed and noted the following in the patient's chart:   . Medical and social history . Use of alcohol, tobacco or illicit drugs  . Current medications and supplements . Functional ability and status . Nutritional status . Physical activity . Advanced directives . List of other physicians . Hospitalizations, surgeries, and ER visits in previous 12 months . Vitals . Screenings to include cognitive, depression, and falls . Referrals and appointments  In addition, I have reviewed and discussed with Ronald [name] certain preventive protocols, quality metrics, and best practice recommendations. A written personalized care plan for preventive services as well as general preventive health recommendations is  available and can be mailed to the patient at his request.      [place] Dec  12/24/2018

## 2018-12-24 NOTE — Patient Instructions (Signed)
  Ronald [name] , Thank you for taking time to come for your Medicare Wellness Visit. I appreciate your ongoing commitment to your health goals. Please review the following plan we discussed and let me know if I can assist you in the future.   These are the goals we discussed: Goals   None     This is a list of the screening recommended for you and due dates:  Health Maintenance  Topic Date Due  . Eye exam for diabetics  02/12/1958  . Tetanus Vaccine  02/13/1967  . Colon Cancer Screening  02/12/1998  . Colon Cancer Screening  02/12/1998  . Complete foot exam   01/16/2018  . Flu Shot  01/08/2019  . Hemoglobin A1C  05/28/2019  .  Hepatitis C: One time screening is recommended by Center for Disease Control  (CDC) for  adults born from [birth year] through 1963.   Completed  . Pneumonia vaccines  Completed

## 2018-12-24 NOTE — Telephone Encounter (Signed)
Please resend script to requested pharmacy.

## 2019-01-17 ENCOUNTER — Other Ambulatory Visit: Payer: Self-pay

## 2019-01-18 ENCOUNTER — Ambulatory Visit: Payer: Medicare HMO | Admitting: Family

## 2019-01-18 ENCOUNTER — Other Ambulatory Visit: Payer: Self-pay | Admitting: Family

## 2019-01-18 DIAGNOSIS — G4700 Insomnia, unspecified: Secondary | ICD-10-CM

## 2019-01-18 DIAGNOSIS — E1142 Type 2 diabetes mellitus with diabetic polyneuropathy: Secondary | ICD-10-CM

## 2019-01-18 DIAGNOSIS — E785 Hyperlipidemia, unspecified: Secondary | ICD-10-CM

## 2019-02-24 ENCOUNTER — Other Ambulatory Visit: Payer: Self-pay | Admitting: Family

## 2019-02-24 DIAGNOSIS — I509 Heart failure, unspecified: Secondary | ICD-10-CM

## 2019-02-24 DIAGNOSIS — K219 Gastro-esophageal reflux disease without esophagitis: Secondary | ICD-10-CM

## 2019-02-24 DIAGNOSIS — M7989 Other specified soft tissue disorders: Secondary | ICD-10-CM

## 2019-02-24 DIAGNOSIS — R0602 Shortness of breath: Secondary | ICD-10-CM

## 2019-03-19 ENCOUNTER — Other Ambulatory Visit: Payer: Self-pay | Admitting: Family

## 2019-03-31 ENCOUNTER — Other Ambulatory Visit: Payer: Self-pay

## 2019-04-01 ENCOUNTER — Encounter: Payer: Self-pay | Admitting: Family

## 2019-04-01 ENCOUNTER — Ambulatory Visit (INDEPENDENT_AMBULATORY_CARE_PROVIDER_SITE_OTHER): Payer: Medicare HMO

## 2019-04-01 ENCOUNTER — Ambulatory Visit (INDEPENDENT_AMBULATORY_CARE_PROVIDER_SITE_OTHER): Payer: Medicare HMO | Admitting: Family

## 2019-04-01 VITALS — BP 167/83 | HR 71 | Temp 97.3°F | Ht 69.0 in | Wt 186.4 lb

## 2019-04-01 DIAGNOSIS — Z720 Tobacco use: Secondary | ICD-10-CM

## 2019-04-01 DIAGNOSIS — I700 Atherosclerosis of aorta: Secondary | ICD-10-CM | POA: Diagnosis not present

## 2019-04-01 DIAGNOSIS — Z9889 Other specified postprocedural states: Secondary | ICD-10-CM

## 2019-04-01 DIAGNOSIS — Z9119 Patient's noncompliance with other medical treatment and regimen: Secondary | ICD-10-CM | POA: Diagnosis not present

## 2019-04-01 DIAGNOSIS — I509 Heart failure, unspecified: Secondary | ICD-10-CM | POA: Diagnosis not present

## 2019-04-01 DIAGNOSIS — I10 Essential (primary) hypertension: Secondary | ICD-10-CM

## 2019-04-01 DIAGNOSIS — Z23 Encounter for immunization: Secondary | ICD-10-CM

## 2019-04-01 DIAGNOSIS — J439 Emphysema, unspecified: Secondary | ICD-10-CM | POA: Diagnosis not present

## 2019-04-01 DIAGNOSIS — I2510 Atherosclerotic heart disease of native coronary artery without angina pectoris: Secondary | ICD-10-CM | POA: Diagnosis not present

## 2019-04-01 DIAGNOSIS — J441 Chronic obstructive pulmonary disease with (acute) exacerbation: Secondary | ICD-10-CM

## 2019-04-01 DIAGNOSIS — R0602 Shortness of breath: Secondary | ICD-10-CM | POA: Diagnosis not present

## 2019-04-01 DIAGNOSIS — Z91199 Patient's noncompliance with other medical treatment and regimen due to unspecified reason: Secondary | ICD-10-CM

## 2019-04-01 DIAGNOSIS — I890 Lymphedema, not elsewhere classified: Secondary | ICD-10-CM | POA: Insufficient documentation

## 2019-04-01 DIAGNOSIS — I152 Hypertension secondary to endocrine disorders: Secondary | ICD-10-CM

## 2019-04-01 DIAGNOSIS — E1159 Type 2 diabetes mellitus with other circulatory complications: Secondary | ICD-10-CM | POA: Diagnosis not present

## 2019-04-01 DIAGNOSIS — E1142 Type 2 diabetes mellitus with diabetic polyneuropathy: Secondary | ICD-10-CM

## 2019-04-01 DIAGNOSIS — F419 Anxiety disorder, unspecified: Secondary | ICD-10-CM

## 2019-04-01 DIAGNOSIS — R69 Illness, unspecified: Secondary | ICD-10-CM | POA: Diagnosis not present

## 2019-04-01 DIAGNOSIS — Z1211 Encounter for screening for malignant neoplasm of colon: Secondary | ICD-10-CM

## 2019-04-01 DIAGNOSIS — F331 Major depressive disorder, recurrent, moderate: Secondary | ICD-10-CM

## 2019-04-01 LAB — BAYER DCA HB A1C WAIVED: HB A1C (BAYER DCA - WAIVED): 7.3 % — ABNORMAL HIGH (ref <7.0)

## 2019-04-01 MED ORDER — LOSARTAN POTASSIUM 100 MG PO TABS: 100.0000 mg | ORAL_TABLET | Freq: Every day | ORAL | 3 refills | Status: DC

## 2019-04-01 MED ORDER — AMOXICILLIN-POT CLAVULANATE 875-125 MG PO TABS: 1.0000 | ORAL_TABLET | Freq: Two times a day (BID) | ORAL | 0 refills | Status: AC

## 2019-04-01 MED ORDER — PREDNISONE 20 MG PO TABS: ORAL_TABLET | ORAL | 0 refills | Status: DC

## 2019-04-01 NOTE — Progress Notes (Signed)
Subjective:    Patient ID: Ronald [name], male    DOB: [birth year], 72 y.o.   MRN: [id number]  Chief Complaint  Patient presents with  . Hypertension  . Medical Management of Chronic Issues  . arms and hand swelling  . Shortness of Breath   PT presents to the office today for chronic follow up. PT is followed by Cardiologists. Pt's BP is elevated today. He is noncompliant with his medications and appointments. He can not read and this does make his medication adherence difficult at times. His ex-wife does his medications for him. Hypertension This is a chronic problem. The current episode started more than 1 year ago. The problem has been waxing and waning since onset. The problem is uncontrolled. Associated symptoms include anxiety, malaise/fatigue, peripheral edema (hands) and shortness of breath. Pertinent negatives include no blurred vision or headaches. Risk factors for coronary artery disease include dyslipidemia, diabetes mellitus and sedentary lifestyle. The current treatment provides no improvement. Hypertensive end-organ damage includes CAD/MI and heart failure.  Shortness of Breath Pertinent negatives include no headaches. His past medical history is significant for a heart failure.  Congestive Heart Failure Presents for follow-up visit. Associated symptoms include fatigue, orthopnea and shortness of breath. The symptoms have been stable.  Depression        This is a chronic problem.  The current episode started more than 1 year ago.   The onset quality is gradual.   The problem occurs every several days.  The problem has been waxing and waning since onset.  Associated symptoms include decreased concentration, fatigue, helplessness, hopelessness, restlessness, decreased interest and sad.  Associated symptoms include no headaches.  Past medical history includes anxiety.   Nicotine Dependence Presents for follow-up visit. Symptoms include decreased concentration, fatigue and  irritability. His urge triggers include company of smokers. The symptoms have been stable. He smokes 1 pack (1 1/2 packs a day) of cigarettes per day.  Anxiety Presents for follow-up visit. Symptoms include decreased concentration, depressed mood, excessive worry, irritability, restlessness and shortness of breath. Symptoms occur occasionally. The quality of sleep is good.    COPD  Pt conitnes to smoke 1 1/2 packs a day. Has SOB. Uses Breo daily and albuterol daily.     Review of Systems  Constitutional: Positive for fatigue, irritability and malaise/fatigue.  Eyes: Negative for blurred vision.  Respiratory: Positive for shortness of breath.   Neurological: Negative for headaches.  Psychiatric/Behavioral: Positive for decreased concentration and depression.  All other systems reviewed and are negative.      Objective:   Physical Exam Vitals signs reviewed.  Constitutional:      General: He is not in acute distress.    Appearance: He is well-developed.  HENT:     Head: Normocephalic.     Right Ear: There is impacted cerumen.     Left Ear: There is impacted cerumen.  Eyes:     General:        Right eye: No discharge.        Left eye: No discharge.     Pupils: Pupils are equal, round, and reactive to light.  Neck:     Musculoskeletal: Normal range of motion and neck supple.     Thyroid: No thyromegaly.  Cardiovascular:     Rate and Rhythm: Normal rate and regular rhythm.     Heart sounds: Normal heart sounds. No murmur.  Pulmonary:     Effort: Pulmonary effort is normal. No respiratory distress.  Breath sounds: Examination of the left-upper field reveals decreased breath sounds. Examination of the left-middle field reveals decreased breath sounds. Examination of the left-lower field reveals decreased breath sounds. Decreased breath sounds and wheezing present.     Comments: Coarse nonproductive cough Abdominal:     General: Bowel sounds are normal. There is no  distension.     Palpations: Abdomen is soft.     Tenderness: There is no abdominal tenderness.  Musculoskeletal: Normal range of motion.        General: No tenderness.  Skin:    General: Skin is warm and dry.     Findings: No erythema or rash.  Neurological:     Mental Status: He is alert and oriented to person, place, and time.     Cranial Nerves: No cranial nerve deficit.     Deep Tendon Reflexes: Reflexes are normal and symmetric.  Psychiatric:        Behavior: Behavior normal.        Thought Content: Thought content normal.        Judgment: Judgment normal.       BP (!) 167/83   Pulse 71   Temp (!) 97.3 F (36.3 C) (Temporal)   Ht 5' 9"  (1.753 m)   Wt 186 lb 6.4 oz (84.6 kg)   SpO2 96%   BMI 27.53 kg/m      Assessment & Plan:  Ronald [name] comes in today with chief complaint of Hypertension, Medical Management of Chronic Issues, arms and hand swelling, and Shortness of Breath   Diagnosis and orders addressed:  1. Hypertension associated with diabetes ([place]) Will increase losartan to 100 mg from 50 mg -Dash diet information given -Exercise encouraged - Stress Management  -Continue current meds -RTO in 2 weeks  - CMP14+EGFR - CBC with Differential/Platelet - losartan (COZAAR) 100 MG tablet; Take 1 tablet (100 mg total) by mouth daily.  Dispense: 90 tablet; Refill: 3  2. Congestive heart failure, unspecified HF chronicity, unspecified heart failure type ([place]) Keep Cardiologists appts  - CMP14+EGFR - CBC with Differential/Platelet  3. CAD in native artery - CMP14+EGFR - CBC with Differential/Platelet  4. Aortic atherosclerosis ([facility]) - CMP14+EGFR - CBC with Differential/Platelet  5. Pulmonary emphysema, unspecified emphysema type ([place]) Smoking cessation discussed Continue Breo daily - CMP14+EGFR - CBC with Differential/Platelet - DG Chest 2 View; Future  6. Tobacco abuse - CMP14+EGFR - CBC with Differential/Platelet - Ambulatory referral to ENT   7. Type 2 diabetes mellitus with diabetic polyneuropathy, without long-term current use of insulin ([facility]) - Bayer DCA Hb A1c Waived - CMP14+EGFR - CBC with Differential/Platelet  8. Diabetic polyneuropathy associated with type 2 diabetes mellitus ([facility]CC) - CMP14+EGFR - CBC with Differential/Platelet  9. Anxiety - CMP14+EGFR - CBC with Differential/Platelet  10. Moderate episode of recurrent major depressive disorder ([facility]) - CMP14+EGFR - CBC with Differential/Platelet  11. Noncompliance - CMP14+EGFR - CBC with Differential/Platelet - Ambulatory referral to ENT  12. Lymphedema Referral to Lymphedema clinic - CMP14+EGFR - CBC with Differential/Platelet - Ambulatory referral to Physical Therapy - Ambulatory referral to ENT  13. COPD exacerbation ([facility]CC) - predniSONE (DELTASONE) 20 MG tablet; Take 3 tabs daily for 1 week, then 2 tabs for 1 week, then 1 tab for one week  Dispense: 42 tablet; Refill: 0 - amoxicillin-clavulanate (AUGMENTIN) 875-125 MG tablet; Take 1 tablet by mouth 2 (two) times daily for 10 days.  Dispense: 20 tablet; Refill: 0  14. Colon cancer screening - Cologuard  15. Hx of  tracheostomy - Ambulatory referral to ENT   Labs pending Health Maintenance reviewed Diet and exercise encouraged  Follow up plan: 2 weeks    [name], FNP

## 2019-04-01 NOTE — Patient Instructions (Signed)

## 2019-04-02 LAB — CBC WITH DIFFERENTIAL/PLATELET
Basophils Absolute: 0.1 10*3/uL (ref 0.0–0.2)
Basos: 1 %
EOS (ABSOLUTE): 0.2 10*3/uL (ref 0.0–0.4)
Eos: 2 %
Hematocrit: 48.2 % (ref 37.5–51.0)
Hemoglobin: 16.7 g/dL (ref 13.0–17.7)
Immature Grans (Abs): 0 10*3/uL (ref 0.0–0.1)
Immature Granulocytes: 0 %
Lymphocytes Absolute: 2.1 10*3/uL (ref 0.7–3.1)
Lymphs: 20 %
MCH: 29.5 pg (ref 26.6–33.0)
MCHC: 34.6 g/dL (ref 31.5–35.7)
MCV: 85 fL (ref 79–97)
Monocytes Absolute: 1 10*3/uL — ABNORMAL HIGH (ref 0.1–0.9)
Monocytes: 9 %
Neutrophils Absolute: 7 10*3/uL (ref 1.4–7.0)
Neutrophils: 68 %
Platelets: 391 10*3/uL (ref 150–450)
RBC: 5.66 x10E6/uL (ref 4.14–5.80)
RDW: 13 % (ref 11.6–15.4)
WBC: 10.4 10*3/uL (ref 3.4–10.8)

## 2019-04-02 LAB — CMP14+EGFR
ALT: 12 IU/L (ref 0–44)
AST: 6 IU/L (ref 0–40)
Albumin/Globulin Ratio: 1.3 (ref 1.2–2.2)
Albumin: 4.1 g/dL (ref 3.7–4.7)
Alkaline Phosphatase: 140 IU/L — ABNORMAL HIGH (ref 39–117)
BUN/Creatinine Ratio: 11 (ref 10–24)
BUN: 11 mg/dL (ref 8–27)
Bilirubin Total: 0.5 mg/dL (ref 0.0–1.2)
CO2: 29 mmol/L (ref 20–29)
Calcium: 9.2 mg/dL (ref 8.6–10.2)
Chloride: 97 mmol/L (ref 96–106)
Creatinine, Ser: 1.04 mg/dL (ref 0.76–1.27)
GFR calc Af Amer: 83 mL/min/{1.73_m2} (ref >59)
GFR calc non Af Amer: 72 mL/min/{1.73_m2} (ref >59)
Globulin, Total: 3.2 g/dL (ref 1.5–4.5)
Glucose: 188 mg/dL — ABNORMAL HIGH (ref 65–99)
Potassium: 3.6 mmol/L (ref 3.5–5.2)
Sodium: 140 mmol/L (ref 134–144)
Total Protein: 7.3 g/dL (ref 6.0–8.5)

## 2019-04-17 ENCOUNTER — Other Ambulatory Visit: Payer: Self-pay | Admitting: Family

## 2019-04-27 ENCOUNTER — Other Ambulatory Visit: Payer: Self-pay | Admitting: Family

## 2019-04-27 DIAGNOSIS — E785 Hyperlipidemia, unspecified: Secondary | ICD-10-CM

## 2019-04-27 DIAGNOSIS — E1159 Type 2 diabetes mellitus with other circulatory complications: Secondary | ICD-10-CM

## 2019-04-27 DIAGNOSIS — I152 Hypertension secondary to endocrine disorders: Secondary | ICD-10-CM

## 2019-05-02 ENCOUNTER — Telehealth: Payer: Self-pay | Admitting: Family

## 2019-05-03 ENCOUNTER — Encounter: Payer: Self-pay | Admitting: Family

## 2019-05-03 ENCOUNTER — Ambulatory Visit (INDEPENDENT_AMBULATORY_CARE_PROVIDER_SITE_OTHER): Payer: Medicare HMO | Admitting: Family

## 2019-05-03 ENCOUNTER — Other Ambulatory Visit: Payer: Self-pay

## 2019-05-03 DIAGNOSIS — J439 Emphysema, unspecified: Secondary | ICD-10-CM | POA: Diagnosis not present

## 2019-05-03 DIAGNOSIS — Z720 Tobacco use: Secondary | ICD-10-CM

## 2019-05-03 DIAGNOSIS — Z91199 Patient's noncompliance with other medical treatment and regimen due to unspecified reason: Secondary | ICD-10-CM

## 2019-05-03 DIAGNOSIS — I10 Essential (primary) hypertension: Secondary | ICD-10-CM | POA: Diagnosis not present

## 2019-05-03 DIAGNOSIS — I152 Hypertension secondary to endocrine disorders: Secondary | ICD-10-CM

## 2019-05-03 DIAGNOSIS — J441 Chronic obstructive pulmonary disease with (acute) exacerbation: Secondary | ICD-10-CM | POA: Diagnosis not present

## 2019-05-03 DIAGNOSIS — F321 Major depressive disorder, single episode, moderate: Secondary | ICD-10-CM

## 2019-05-03 DIAGNOSIS — Z9119 Patient's noncompliance with other medical treatment and regimen: Secondary | ICD-10-CM | POA: Diagnosis not present

## 2019-05-03 DIAGNOSIS — F411 Generalized anxiety disorder: Secondary | ICD-10-CM

## 2019-05-03 DIAGNOSIS — E1159 Type 2 diabetes mellitus with other circulatory complications: Secondary | ICD-10-CM | POA: Diagnosis not present

## 2019-05-03 DIAGNOSIS — R69 Illness, unspecified: Secondary | ICD-10-CM | POA: Diagnosis not present

## 2019-05-03 MED ORDER — ESCITALOPRAM OXALATE 5 MG PO TABS: ORAL_TABLET | ORAL | 0 refills | Status: DC

## 2019-05-03 NOTE — Progress Notes (Signed)
Virtual Visit via telephone Note Due to COVID-19 pandemic this visit was conducted virtually. This visit type was conducted due to national recommendations for restrictions regarding the COVID-19 Pandemic (e.g. social distancing, sheltering in place) in an effort to limit this patient's exposure and mitigate transmission in our community. All issues noted in this document were discussed and addressed.  A physical exam was not performed with this format.  I connected with Ronald [name] on 05/03/19 at 10:27 AM by telephone and verified that I am speaking with the correct person using two identifiers. Ronald [name] is currently located at home and ex-wife is currently with him during visit. The provider, [name], FNP is located in their office at time of visit.  I discussed the limitations, risks, security and privacy concerns of performing an evaluation and management service by telephone and the availability of in person appointments. I also discussed with the patient that there may be a patient responsible charge related to this service. The patient expressed understanding and agreed to proceed.   History and Present Illness:  Shortness of Breath This is a chronic problem. The current episode started more than 1 year ago. The problem occurs every few minutes. The problem has been waxing and waning. Associated symptoms include sputum production and wheezing. Pertinent negatives include no claudication, coryza, ear pain, fever, headaches or sore throat. The symptoms are aggravated by smoke. Risk factors include smoking. He has tried rest, steroid inhalers and ipratropium inhalers for the symptoms. The treatment provided mild relief. His past medical history is significant for COPD.  Nicotine Dependence Presents for follow-up visit. Symptoms include decreased concentration and irritability. Symptoms are negative for sore throat. His urge triggers include company of smokers. He smokes 1 pack (1  1/2) of cigarettes per day.  Hypertension This is a chronic problem. The current episode started more than 1 year ago. The problem has been resolved since onset. The problem is controlled. Associated symptoms include anxiety, malaise/fatigue, peripheral edema and shortness of breath. Pertinent negatives include no headaches. Risk factors for coronary artery disease include dyslipidemia, obesity, male gender and sedentary lifestyle. Past treatments include calcium channel blockers and angiotensin blockers. The current treatment provides moderate improvement. Hypertensive end-organ damage includes CAD/MI.  Anxiety Presents for follow-up visit. Symptoms include decreased concentration, depressed mood, excessive worry, irritability, nervous/anxious behavior, restlessness and shortness of breath. Symptoms occur occasionally. The severity of symptoms is moderate.        Review of Systems  Constitutional: Positive for irritability and malaise/fatigue. Negative for fever.  HENT: Negative for ear pain and sore throat.   Respiratory: Positive for sputum production, shortness of breath and wheezing.   Cardiovascular: Negative for claudication.  Neurological: Negative for headaches.  Psychiatric/Behavioral: Positive for decreased concentration. The patient is nervous/anxious.      Observations/Objective: Hoarse voice, intermittent coarse cough,   Assessment and Plan: 1. COPD exacerbation ([place]) - Ambulatory referral to Pulmonology  2. Hypertension associated with diabetes ([place])  3. Pulmonary emphysema, unspecified emphysema type ([place]) - Ambulatory referral to Pulmonology  4. Tobacco abuse Smoking cessation discussed - Ambulatory referral to Pulmonology  5. Noncompliance  6. Depression, major, single episode, moderate (HCC) Will start Lexapro 5 mg then increase to 10 mg  Stress management  - escitalopram (LEXAPRO) 5 MG tablet; Take 1 tablet (5 mg total) by mouth daily for 14 days, THEN 2  tablets (10 mg total) daily for 28 days.  Dispense: 70 tablet; Refill: 0  7. GAD (generalized anxiety  disorder) - escitalopram (LEXAPRO) 5 MG tablet; Take 1 tablet (5 mg total) by mouth daily for 14 days, THEN 2 tablets (10 mg total) daily for 28 days.  Dispense: 70 tablet; Refill: 0   Will do follow up in 6 weeks to recheck GAD and Depression  Referral pending for Pulmonologist     I discussed the assessment and treatment plan with the patient. The patient was provided an opportunity to ask questions and all were answered. The patient agreed with the plan and demonstrated an understanding of the instructions.   The patient was advised to call back or seek an in-person evaluation if the symptoms worsen or if the condition fails to improve as anticipated.  The above assessment and management plan was discussed with the patient. The patient verbalized understanding of and has agreed to the management plan. Patient is aware to call the clinic if symptoms persist or worsen. Patient is aware when to return to the clinic for a follow-up visit. Patient educated on when it is appropriate to go to the emergency department.   Time call ended:  10:50 AM   I provided 23 minutes of non-face-to-face time during this encounter.    [name], FNP

## 2019-05-04 ENCOUNTER — Other Ambulatory Visit: Payer: Self-pay

## 2019-05-04 ENCOUNTER — Telehealth: Payer: Self-pay | Admitting: Family

## 2019-05-04 DIAGNOSIS — F321 Major depressive disorder, single episode, moderate: Secondary | ICD-10-CM

## 2019-05-04 DIAGNOSIS — F411 Generalized anxiety disorder: Secondary | ICD-10-CM

## 2019-05-04 MED ORDER — ESCITALOPRAM OXALATE 5 MG PO TABS: ORAL_TABLET | ORAL | 0 refills | Status: DC

## 2019-05-04 NOTE — Telephone Encounter (Signed)
Pharmacy changed and med sent to right pharmacy. Patient notified and verbalized understanding

## 2019-05-07 ENCOUNTER — Other Ambulatory Visit: Payer: Self-pay | Admitting: Family

## 2019-05-07 DIAGNOSIS — J439 Emphysema, unspecified: Secondary | ICD-10-CM

## 2019-05-11 ENCOUNTER — Other Ambulatory Visit: Payer: Self-pay | Admitting: Family

## 2019-05-11 ENCOUNTER — Telehealth: Payer: Self-pay

## 2019-05-11 DIAGNOSIS — F419 Anxiety disorder, unspecified: Secondary | ICD-10-CM

## 2019-05-11 DIAGNOSIS — F329 Major depressive disorder, single episode, unspecified: Secondary | ICD-10-CM

## 2019-05-11 DIAGNOSIS — F321 Major depressive disorder, single episode, moderate: Secondary | ICD-10-CM

## 2019-05-11 DIAGNOSIS — F32A Depression, unspecified: Secondary | ICD-10-CM

## 2019-05-11 DIAGNOSIS — F411 Generalized anxiety disorder: Secondary | ICD-10-CM

## 2019-05-11 NOTE — Telephone Encounter (Signed)
PA for Escitalopram 5mg . Paper work on Customer service manager to get signed.

## 2019-05-16 NOTE — Telephone Encounter (Signed)
Form faxed

## 2019-05-18 NOTE — Telephone Encounter (Signed)
Insurance only allows 45 tablets per 30 days so rx needs to be written to reflect this to be approved.

## 2019-05-19 MED ORDER — ESCITALOPRAM OXALATE 10 MG PO TABS: 10.0000 mg | ORAL_TABLET | Freq: Every day | ORAL | 3 refills | Status: DC

## 2019-05-19 NOTE — Addendum Note (Signed)
Addended by: [name] A on: 05/19/2019 09:32 AM   Modules accepted: Orders

## 2019-05-29 ENCOUNTER — Other Ambulatory Visit: Payer: Self-pay | Admitting: Family

## 2019-05-29 DIAGNOSIS — E1142 Type 2 diabetes mellitus with diabetic polyneuropathy: Secondary | ICD-10-CM

## 2019-05-29 DIAGNOSIS — F411 Generalized anxiety disorder: Secondary | ICD-10-CM

## 2019-05-29 DIAGNOSIS — F321 Major depressive disorder, single episode, moderate: Secondary | ICD-10-CM

## 2019-05-30 ENCOUNTER — Other Ambulatory Visit: Payer: Self-pay | Admitting: Family

## 2019-06-16 ENCOUNTER — Ambulatory Visit (INDEPENDENT_AMBULATORY_CARE_PROVIDER_SITE_OTHER): Payer: Medicare HMO | Admitting: Family

## 2019-06-16 NOTE — Progress Notes (Signed)
   Virtual Visit via telephone Note Due to COVID-19 pandemic this visit was conducted virtually. This visit type was conducted due to national recommendations for restrictions regarding the COVID-19 Pandemic (e.g. social distancing, sheltering in place) in an effort to limit this patient's exposure and mitigate transmission in our community. All issues noted in this document were discussed and addressed.  A physical exam was not performed with this format.  Attempted to call patient at 10:44 AM and 10:46 AM, Unable to leave VM as box is full.    [name], FNP

## 2019-06-17 ENCOUNTER — Encounter: Payer: Self-pay | Admitting: Family

## 2019-06-17 ENCOUNTER — Ambulatory Visit (INDEPENDENT_AMBULATORY_CARE_PROVIDER_SITE_OTHER): Payer: Medicare HMO | Admitting: Family

## 2019-06-17 DIAGNOSIS — R69 Illness, unspecified: Secondary | ICD-10-CM | POA: Diagnosis not present

## 2019-06-17 DIAGNOSIS — Z720 Tobacco use: Secondary | ICD-10-CM | POA: Diagnosis not present

## 2019-06-17 DIAGNOSIS — F331 Major depressive disorder, recurrent, moderate: Secondary | ICD-10-CM

## 2019-06-17 DIAGNOSIS — E1142 Type 2 diabetes mellitus with diabetic polyneuropathy: Secondary | ICD-10-CM

## 2019-06-17 DIAGNOSIS — F419 Anxiety disorder, unspecified: Secondary | ICD-10-CM

## 2019-06-17 DIAGNOSIS — R531 Weakness: Secondary | ICD-10-CM | POA: Diagnosis not present

## 2019-06-17 MED ORDER — ESCITALOPRAM OXALATE 20 MG PO TABS: 20.0000 mg | ORAL_TABLET | Freq: Every day | ORAL | 5 refills | Status: DC

## 2019-06-17 MED ORDER — SITAGLIPTIN PHOSPHATE 50 MG PO TABS: 50.0000 mg | ORAL_TABLET | Freq: Every day | ORAL | 1 refills | Status: DC

## 2019-06-17 NOTE — Progress Notes (Signed)
Virtual Visit via telephone Note Due to COVID-19 pandemic this visit was conducted virtually. This visit type was conducted due to national recommendations for restrictions regarding the COVID-19 Pandemic (e.g. social distancing, sheltering in place) in an effort to limit this patient's exposure and mitigate transmission in our community. All issues noted in this document were discussed and addressed.  A physical exam was not performed with this format.  I connected with [name] on 06/17/19 at 10:00 AM  by telephone and verified that I am speaking with the correct person using two identifiers. [name] is currently located at home and ex-wife is currently with him  during visit. The provider, [name], FNP is located in their office at time of visit.  I discussed the limitations, risks, security and privacy concerns of performing an evaluation and management service by telephone and the availability of in person appointments. I also discussed with the patient that there may be a patient responsible charge related to this service. The patient expressed understanding and agreed to proceed.   History and Present Illness:  Pt calls the office today to recheck GAD and Depression. He was started on Lexapro 10 mg. He states he has felt improvement, but still feels down.   He stopped the metformin because it was "causing swelling in my arms". Anxiety Presents for follow-up visit. Symptoms include decreased concentration, depressed mood, excessive worry, irritability and restlessness. Symptoms occur occasionally. The severity of symptoms is moderate.    Depression        This is a chronic problem.  The current episode started more than 1 year ago.   The onset quality is gradual.   The problem occurs every several days.  The problem has been waxing and waning since onset.  Associated symptoms include decreased concentration, irritable, restlessness and sad.  Associated symptoms include no  helplessness and no hopelessness.  Past treatments include SSRIs - Selective serotonin reuptake inhibitors. Diabetes He presents for his follow-up diabetic visit. He has type 2 diabetes mellitus. His disease course has been stable. There are no hypoglycemic associated symptoms. Associated symptoms include blurred vision. Symptoms are stable. Diabetic complications include heart disease. Risk factors for coronary artery disease include diabetes mellitus, dyslipidemia, family history, male sex, hypertension, sedentary lifestyle and tobacco exposure. He is following a generally unhealthy diet. (Does not check bs at home )      Review of Systems  Constitutional: Positive for irritability.  Eyes: Positive for blurred vision.  Psychiatric/Behavioral: Positive for decreased concentration and depression.     Observations/Objective: No SOB or distress noted   Assessment and Plan: Ronald [name] comes in today with chief complaint of No chief complaint on file.   Diagnosis and orders addressed:  1. Moderate episode of recurrent major depressive disorder (HCC) - escitalopram (LEXAPRO) 20 MG tablet; Take 1 tablet (20 mg total) by mouth daily.  Dispense: 30 tablet; Refill: 5  2. Anxiety - escitalopram (LEXAPRO) 20 MG tablet; Take 1 tablet (20 mg total) by mouth daily.  Dispense: 30 tablet; Refill: 5  3. Type 2 diabetes mellitus with diabetic polyneuropathy, without long-term current use of insulin (HCC) -Since stopping metformin, will add Januvia  Strict low carb diet  4. Tobacco abuse  5. Weakness     Will increase Lexapro to 20 mg from 10 mg Stress management  Weakness discussed- Will do face to face in 2 weeks and order PT  Health Maintenance reviewed Diet and exercise encouraged  Follow up  plan: 2 weeks to recheck DM and referral to PT      I discussed the assessment and treatment plan with the patient. The patient was provided an opportunity to ask questions and all were  answered. The patient agreed with the plan and demonstrated an understanding of the instructions.   The patient was advised to call back or seek an in-person evaluation if the symptoms worsen or if the condition fails to improve as anticipated.  The above assessment and management plan was discussed with the patient. The patient verbalized understanding of and has agreed to the management plan. Patient is aware to call the clinic if symptoms persist or worsen. Patient is aware when to return to the clinic for a follow-up visit. Patient educated on when it is appropriate to go to the emergency department.   Time call ended:  10:20 AM  I provided 20 minutes of non-face-to-face time during this encounter.    [name], FNP

## 2019-06-23 ENCOUNTER — Other Ambulatory Visit: Payer: Self-pay | Admitting: Family

## 2019-07-01 ENCOUNTER — Other Ambulatory Visit: Payer: Self-pay

## 2019-07-04 ENCOUNTER — Other Ambulatory Visit: Payer: Self-pay | Admitting: Family

## 2019-07-04 ENCOUNTER — Other Ambulatory Visit: Payer: Self-pay

## 2019-07-04 ENCOUNTER — Encounter: Payer: Self-pay | Admitting: Family

## 2019-07-04 ENCOUNTER — Ambulatory Visit (INDEPENDENT_AMBULATORY_CARE_PROVIDER_SITE_OTHER): Payer: Medicare HMO | Admitting: Family

## 2019-07-04 VITALS — BP 135/67 | HR 62 | Temp 98.3°F | Ht 69.0 in | Wt 183.4 lb

## 2019-07-04 DIAGNOSIS — I700 Atherosclerosis of aorta: Secondary | ICD-10-CM

## 2019-07-04 DIAGNOSIS — I10 Essential (primary) hypertension: Secondary | ICD-10-CM | POA: Diagnosis not present

## 2019-07-04 DIAGNOSIS — I509 Heart failure, unspecified: Secondary | ICD-10-CM | POA: Diagnosis not present

## 2019-07-04 DIAGNOSIS — F329 Major depressive disorder, single episode, unspecified: Secondary | ICD-10-CM

## 2019-07-04 DIAGNOSIS — R69 Illness, unspecified: Secondary | ICD-10-CM | POA: Diagnosis not present

## 2019-07-04 DIAGNOSIS — I890 Lymphedema, not elsewhere classified: Secondary | ICD-10-CM

## 2019-07-04 DIAGNOSIS — F419 Anxiety disorder, unspecified: Secondary | ICD-10-CM

## 2019-07-04 DIAGNOSIS — I2510 Atherosclerotic heart disease of native coronary artery without angina pectoris: Secondary | ICD-10-CM | POA: Diagnosis not present

## 2019-07-04 DIAGNOSIS — Z91199 Patient's noncompliance with other medical treatment and regimen due to unspecified reason: Secondary | ICD-10-CM

## 2019-07-04 DIAGNOSIS — F331 Major depressive disorder, recurrent, moderate: Secondary | ICD-10-CM

## 2019-07-04 DIAGNOSIS — J439 Emphysema, unspecified: Secondary | ICD-10-CM

## 2019-07-04 DIAGNOSIS — E1142 Type 2 diabetes mellitus with diabetic polyneuropathy: Secondary | ICD-10-CM

## 2019-07-04 DIAGNOSIS — Z9119 Patient's noncompliance with other medical treatment and regimen: Secondary | ICD-10-CM

## 2019-07-04 DIAGNOSIS — E1159 Type 2 diabetes mellitus with other circulatory complications: Secondary | ICD-10-CM | POA: Diagnosis not present

## 2019-07-04 DIAGNOSIS — F32A Depression, unspecified: Secondary | ICD-10-CM

## 2019-07-04 DIAGNOSIS — R7981 Abnormal blood-gas level: Secondary | ICD-10-CM

## 2019-07-04 DIAGNOSIS — Z720 Tobacco use: Secondary | ICD-10-CM

## 2019-07-04 DIAGNOSIS — I152 Hypertension secondary to endocrine disorders: Secondary | ICD-10-CM

## 2019-07-04 NOTE — Patient Instructions (Signed)
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
## 2019-07-05 ENCOUNTER — Telehealth: Payer: Self-pay | Admitting: *Deleted

## 2019-07-05 ENCOUNTER — Other Ambulatory Visit: Payer: Self-pay | Admitting: Family

## 2019-07-05 LAB — ANEMIA PROFILE B
Basophils Absolute: 0.1 10*3/uL (ref 0.0–0.2)
Basos: 1 %
EOS (ABSOLUTE): 0.2 10*3/uL (ref 0.0–0.4)
Eos: 2 %
Ferritin: 119 ng/mL (ref 30–400)
Folate: 16.3 ng/mL (ref >3.0)
Hematocrit: 48.1 % (ref 37.5–51.0)
Hemoglobin: 16.5 g/dL (ref 13.0–17.7)
Immature Grans (Abs): 0.1 10*3/uL (ref 0.0–0.1)
Immature Granulocytes: 1 %
Iron Saturation: 54 % (ref 15–55)
Iron: 141 ug/dL (ref 38–169)
Lymphocytes Absolute: 2 10*3/uL (ref 0.7–3.1)
Lymphs: 19 %
MCH: 29.1 pg (ref 26.6–33.0)
MCHC: 34.3 g/dL (ref 31.5–35.7)
MCV: 85 fL (ref 79–97)
Monocytes Absolute: 1.1 10*3/uL — ABNORMAL HIGH (ref 0.1–0.9)
Monocytes: 10 %
Neutrophils Absolute: 7.3 10*3/uL — ABNORMAL HIGH (ref 1.4–7.0)
Neutrophils: 67 %
Platelets: 447 10*3/uL (ref 150–450)
RBC: 5.67 x10E6/uL (ref 4.14–5.80)
RDW: 13.3 % (ref 11.6–15.4)
Retic Ct Pct: 1.9 % (ref 0.6–2.6)
Total Iron Binding Capacity: 260 ug/dL (ref 250–450)
UIBC: 119 ug/dL (ref 111–343)
Vitamin B-12: 230 pg/mL — ABNORMAL LOW (ref 232–1245)
WBC: 10.7 10*3/uL (ref 3.4–10.8)

## 2019-07-05 LAB — CMP14+EGFR
ALT: 11 IU/L (ref 0–44)
AST: 12 IU/L (ref 0–40)
Albumin/Globulin Ratio: 1.8 (ref 1.2–2.2)
Albumin: 4.1 g/dL (ref 3.7–4.7)
Alkaline Phosphatase: 132 IU/L — ABNORMAL HIGH (ref 39–117)
BUN/Creatinine Ratio: 9 — ABNORMAL LOW (ref 10–24)
BUN: 7 mg/dL — ABNORMAL LOW (ref 8–27)
Bilirubin Total: 0.8 mg/dL (ref 0.0–1.2)
CO2: 33 mmol/L — ABNORMAL HIGH (ref 20–29)
Calcium: 8.9 mg/dL (ref 8.6–10.2)
Chloride: 94 mmol/L — ABNORMAL LOW (ref 96–106)
Creatinine, Ser: 0.8 mg/dL (ref 0.76–1.27)
GFR calc Af Amer: 104 mL/min/{1.73_m2} (ref >59)
GFR calc non Af Amer: 90 mL/min/{1.73_m2} (ref >59)
Globulin, Total: 2.3 g/dL (ref 1.5–4.5)
Glucose: 165 mg/dL — ABNORMAL HIGH (ref 65–99)
Potassium: 3.3 mmol/L — ABNORMAL LOW (ref 3.5–5.2)
Sodium: 142 mmol/L (ref 134–144)
Total Protein: 6.4 g/dL (ref 6.0–8.5)

## 2019-07-05 LAB — TSH: TSH: 0.678 u[IU]/mL (ref 0.450–4.500)

## 2019-07-05 MED ORDER — POTASSIUM CHLORIDE CRYS ER 20 MEQ PO TBCR: 20.0000 meq | EXTENDED_RELEASE_TABLET | Freq: Every day | ORAL | 1 refills | Status: DC

## 2019-07-06 ENCOUNTER — Telehealth: Payer: Self-pay | Admitting: Family

## 2019-07-06 DIAGNOSIS — J439 Emphysema, unspecified: Secondary | ICD-10-CM

## 2019-07-06 DIAGNOSIS — R7981 Abnormal blood-gas level: Secondary | ICD-10-CM

## 2019-07-06 DIAGNOSIS — I2510 Atherosclerotic heart disease of native coronary artery without angina pectoris: Secondary | ICD-10-CM

## 2019-07-07 ENCOUNTER — Ambulatory Visit: Payer: Medicare HMO

## 2019-07-07 DIAGNOSIS — J439 Emphysema, unspecified: Secondary | ICD-10-CM | POA: Diagnosis not present

## 2019-07-07 LAB — SPECIMEN STATUS REPORT

## 2019-07-07 LAB — GAMMA GT: GGT: 17 IU/L (ref 0–65)

## 2019-07-07 NOTE — Telephone Encounter (Signed)
Can you call and tell them I want 2L continuous.

## 2019-07-07 NOTE — Addendum Note (Signed)
Addended by: Antonietta Barcelona D on: 07/07/2019 02:36 PM   Modules accepted: Orders

## 2019-07-07 NOTE — Telephone Encounter (Addendum)
Order was sent to [facility] yesterday. Information sent via community message to [name]. Pt aware

## 2019-07-07 NOTE — Telephone Encounter (Signed)
See previous note

## 2019-07-21 ENCOUNTER — Ambulatory Visit (HOSPITAL_COMMUNITY): Payer: Medicare HMO | Attending: Family | Admitting: Physical Therapy

## 2019-07-21 ENCOUNTER — Encounter (HOSPITAL_COMMUNITY): Payer: Self-pay

## 2019-07-25 ENCOUNTER — Telehealth (HOSPITAL_COMMUNITY): Payer: Self-pay | Admitting: Physical Therapy

## 2019-07-25 ENCOUNTER — Ambulatory Visit (HOSPITAL_COMMUNITY): Payer: Medicare HMO | Admitting: Physical Therapy

## 2019-07-25 NOTE — Telephone Encounter (Signed)
PT CALLED TO [facility] VIA THE PHONE TREE NO REASON GIVEN

## 2019-07-26 ENCOUNTER — Telehealth (HOSPITAL_COMMUNITY): Payer: Self-pay | Admitting: Physical Therapy

## 2019-07-26 NOTE — Telephone Encounter (Signed)
S/w caregiver Kevin Fenton - she states he is not able to come out to our office and she will call MD to get home health out to see [place]. She cx'd all future appointments and req to close referral.

## 2019-07-27 ENCOUNTER — Ambulatory Visit (HOSPITAL_COMMUNITY): Payer: Medicare HMO | Admitting: Physical Therapy

## 2019-07-28 ENCOUNTER — Ambulatory Visit (HOSPITAL_COMMUNITY): Payer: Medicare HMO | Admitting: Physical Therapy

## 2019-07-31 ENCOUNTER — Other Ambulatory Visit: Payer: Self-pay | Admitting: Family

## 2019-07-31 DIAGNOSIS — G4700 Insomnia, unspecified: Secondary | ICD-10-CM

## 2019-08-02 ENCOUNTER — Ambulatory Visit (HOSPITAL_COMMUNITY): Payer: Medicare HMO | Admitting: Physical Therapy

## 2019-08-03 ENCOUNTER — Other Ambulatory Visit: Payer: Self-pay

## 2019-08-04 ENCOUNTER — Encounter (HOSPITAL_COMMUNITY): Payer: Medicare HMO | Admitting: Physical Therapy

## 2019-08-04 ENCOUNTER — Ambulatory Visit: Payer: Medicare HMO | Admitting: Family

## 2019-08-07 DIAGNOSIS — J439 Emphysema, unspecified: Secondary | ICD-10-CM | POA: Diagnosis not present

## 2019-08-08 ENCOUNTER — Other Ambulatory Visit: Payer: Self-pay | Admitting: Family

## 2019-08-08 ENCOUNTER — Encounter (HOSPITAL_COMMUNITY): Payer: Medicare HMO | Admitting: Physical Therapy

## 2019-08-08 DIAGNOSIS — I152 Hypertension secondary to endocrine disorders: Secondary | ICD-10-CM

## 2019-08-08 DIAGNOSIS — E785 Hyperlipidemia, unspecified: Secondary | ICD-10-CM

## 2019-08-08 DIAGNOSIS — E1159 Type 2 diabetes mellitus with other circulatory complications: Secondary | ICD-10-CM

## 2019-08-10 ENCOUNTER — Encounter (HOSPITAL_COMMUNITY): Payer: Medicare HMO | Admitting: Physical Therapy

## 2019-08-12 ENCOUNTER — Encounter (HOSPITAL_COMMUNITY): Payer: Medicare HMO | Admitting: Physical Therapy

## 2019-08-15 ENCOUNTER — Ambulatory Visit: Payer: Medicare HMO | Admitting: Family

## 2019-08-15 ENCOUNTER — Encounter (HOSPITAL_COMMUNITY): Payer: Medicare HMO | Admitting: Physical Therapy

## 2019-08-17 ENCOUNTER — Encounter (HOSPITAL_COMMUNITY): Payer: Medicare HMO | Admitting: Physical Therapy

## 2019-08-19 ENCOUNTER — Encounter (HOSPITAL_COMMUNITY): Payer: Medicare HMO | Admitting: Physical Therapy

## 2019-08-22 ENCOUNTER — Other Ambulatory Visit: Payer: Self-pay

## 2019-08-23 ENCOUNTER — Other Ambulatory Visit: Payer: Self-pay

## 2019-08-23 ENCOUNTER — Ambulatory Visit (INDEPENDENT_AMBULATORY_CARE_PROVIDER_SITE_OTHER): Payer: Medicare HMO | Admitting: Family

## 2019-08-23 ENCOUNTER — Encounter: Payer: Self-pay | Admitting: Family

## 2019-08-23 VITALS — BP 148/81 | HR 73 | Temp 97.3°F | Ht 69.0 in | Wt 189.0 lb

## 2019-08-23 DIAGNOSIS — I509 Heart failure, unspecified: Secondary | ICD-10-CM | POA: Diagnosis not present

## 2019-08-23 DIAGNOSIS — I700 Atherosclerosis of aorta: Secondary | ICD-10-CM

## 2019-08-23 DIAGNOSIS — I2510 Atherosclerotic heart disease of native coronary artery without angina pectoris: Secondary | ICD-10-CM

## 2019-08-23 DIAGNOSIS — E1159 Type 2 diabetes mellitus with other circulatory complications: Secondary | ICD-10-CM | POA: Diagnosis not present

## 2019-08-23 DIAGNOSIS — Z9119 Patient's noncompliance with other medical treatment and regimen: Secondary | ICD-10-CM | POA: Diagnosis not present

## 2019-08-23 DIAGNOSIS — Z720 Tobacco use: Secondary | ICD-10-CM

## 2019-08-23 DIAGNOSIS — F329 Major depressive disorder, single episode, unspecified: Secondary | ICD-10-CM

## 2019-08-23 DIAGNOSIS — I152 Hypertension secondary to endocrine disorders: Secondary | ICD-10-CM

## 2019-08-23 DIAGNOSIS — E1142 Type 2 diabetes mellitus with diabetic polyneuropathy: Secondary | ICD-10-CM | POA: Diagnosis not present

## 2019-08-23 DIAGNOSIS — J439 Emphysema, unspecified: Secondary | ICD-10-CM

## 2019-08-23 DIAGNOSIS — F331 Major depressive disorder, recurrent, moderate: Secondary | ICD-10-CM

## 2019-08-23 DIAGNOSIS — F32A Depression, unspecified: Secondary | ICD-10-CM

## 2019-08-23 DIAGNOSIS — F419 Anxiety disorder, unspecified: Secondary | ICD-10-CM

## 2019-08-23 DIAGNOSIS — R69 Illness, unspecified: Secondary | ICD-10-CM | POA: Diagnosis not present

## 2019-08-23 DIAGNOSIS — Z91199 Patient's noncompliance with other medical treatment and regimen due to unspecified reason: Secondary | ICD-10-CM

## 2019-08-23 DIAGNOSIS — I10 Essential (primary) hypertension: Secondary | ICD-10-CM

## 2019-08-23 LAB — BAYER DCA HB A1C WAIVED: HB A1C (BAYER DCA - WAIVED): 6.4 % (ref <7.0)

## 2019-08-23 NOTE — Progress Notes (Signed)
Subjective:    Patient ID: Ronald [name], male    DOB: 1946/02/18, 74 y.o.   MRN: [id number]  Chief Complaint  Patient presents with  . Follow-up    PT presents to the office today for chronic follow up. PT is followed by Cardiologists. Pt's BP is elevated today. He is noncompliant with his medications and appointments. He can not read and this does make his medication adherence difficult at times. His ex-wife does his medications for him. She states they never heard from the Pulmonologist referral.  Hypertension This is a chronic problem. The current episode started more than 1 year ago. The problem has been waxing and waning since onset. The problem is uncontrolled. Associated symptoms include anxiety, peripheral edema and shortness of breath. Pertinent negatives include no blurred vision or malaise/fatigue. Risk factors for coronary artery disease include dyslipidemia, diabetes mellitus, obesity, male gender and sedentary lifestyle. The current treatment provides moderate improvement. Hypertensive end-organ damage includes CAD/MI and heart failure.  Congestive Heart Failure Presents for follow-up visit. Associated symptoms include edema, fatigue, orthopnea and shortness of breath. The symptoms have been stable.  Gastroesophageal Reflux He complains of belching, heartburn and a hoarse voice. This is a chronic problem. The current episode started more than 1 year ago. The problem occurs occasionally. Associated symptoms include fatigue and orthopnea. Risk factors include smoking/tobacco exposure. He has tried a PPI for the symptoms. The treatment provided moderate relief.  Diabetes He presents for his follow-up diabetic visit. He has type 2 diabetes mellitus. His disease course has been stable. Hypoglycemia symptoms include nervousness/anxiousness. Associated symptoms include fatigue and visual change. Pertinent negatives for diabetes include no blurred vision. Symptoms are stable. Diabetic  complications include heart disease and peripheral neuropathy. Risk factors for coronary artery disease include dyslipidemia, diabetes mellitus, male sex, hypertension and sedentary lifestyle. (Does not check BS at home ) Eye exam is not current.  Nicotine Dependence Presents for follow-up visit. Symptoms include decreased concentration, fatigue and irritability. His urge triggers include company of smokers. The symptoms have been stable. He smokes < 1/2 a pack of cigarettes per day.  Depression        This is a chronic problem.  The current episode started more than 1 year ago.   The onset quality is gradual.   The problem occurs intermittently.  Associated symptoms include decreased concentration, fatigue, irritable, restlessness, decreased interest and sad.     The symptoms are aggravated by family issues.  Past medical history includes anxiety.   Anxiety Presents for follow-up visit. Symptoms include decreased concentration, depressed mood, excessive worry, irritability, nervous/anxious behavior, restlessness and shortness of breath. Symptoms occur occasionally. The severity of symptoms is moderate. The quality of sleep is good.    COPD Pt continues to smoke 1/2-1 pack a day. Uses Breo daily.     Review of Systems  Constitutional: Positive for fatigue and irritability. Negative for malaise/fatigue.  HENT: Positive for hoarse voice.   Eyes: Negative for blurred vision.  Respiratory: Positive for shortness of breath.   Gastrointestinal: Positive for heartburn.  Psychiatric/Behavioral: Positive for decreased concentration and depression. The patient is nervous/anxious.   All other systems reviewed and are negative.      Objective:   Physical Exam Vitals reviewed.  Constitutional:      General: He is irritable. He is not in acute distress.    Appearance: He is well-developed.  HENT:     Head: Normocephalic.     Right Ear: Tympanic membrane normal.  Left Ear: Tympanic membrane  normal.  Eyes:     General:        Right eye: No discharge.        Left eye: No discharge.     Pupils: Pupils are equal, round, and reactive to light.  Neck:     Thyroid: No thyromegaly.  Cardiovascular:     Rate and Rhythm: Normal rate and regular rhythm.     Heart sounds: Normal heart sounds. No murmur.  Pulmonary:     Effort: Pulmonary effort is normal. No respiratory distress.     Breath sounds: Wheezing present.  Abdominal:     General: Bowel sounds are normal. There is no distension.     Palpations: Abdomen is soft.     Tenderness: There is no abdominal tenderness.  Musculoskeletal:        General: No tenderness.     Cervical back: Normal range of motion and neck supple.     Comments: Bilateral arms 2+ edema  Skin:    General: Skin is warm and dry.     Findings: No erythema or rash.  Neurological:     Mental Status: He is alert and oriented to person, place, and time.     Cranial Nerves: No cranial nerve deficit.     Deep Tendon Reflexes: Reflexes are normal and symmetric.  Psychiatric:        Behavior: Behavior normal.        Thought Content: Thought content normal.        Judgment: Judgment normal.     BP (!) 195/85   Temp (!) 97.3 F (36.3 C) (Temporal)   Ht 5' 9"  (1.753 m)   Wt 189 lb (85.7 kg)   SpO2 96%   BMI 27.91 kg/m      Assessment & Plan:  Ronald [name] comes in today with chief complaint of Follow-up   Diagnosis and orders addressed:  1. Aortic atherosclerosis (HCC) - CMP14+EGFR - CBC with Differential/Platelet  2. CAD in native artery - CMP14+EGFR - CBC with Differential/Platelet  3. Congestive heart failure, unspecified HF chronicity, unspecified heart failure type ([place]) - CMP14+EGFR - CBC with Differential/Platelet  4. Hypertension associated with diabetes ([place]) - CMP14+EGFR - CBC with Differential/Platelet  5. Pulmonary emphysema, unspecified emphysema type ([place]) - CMP14+EGFR - CBC with Differential/Platelet  6. Type 2  diabetes mellitus with diabetic polyneuropathy, without long-term current use of insulin (HCC) - Bayer DCA Hb A1c Waived - CMP14+EGFR - CBC with Differential/Platelet  7. Anxiety - CMP14+EGFR - CBC with Differential/Platelet  8. Moderate episode of recurrent major depressive disorder (HCC) - CMP14+EGFR - CBC with Differential/Platelet  9. Tobacco abuse - CMP14+EGFR - CBC with Differential/Platelet  10. Noncompliance - CMP14+EGFR - CBC with Differential/Platelet  11. Anxiety and depression  They will follow up with Cardiologists and Pulmonologist!  Labs pending Health Maintenance reviewed Diet and exercise encouraged  Follow up plan: 3 months    [name], FNP

## 2019-08-23 NOTE — Patient Instructions (Signed)

## 2019-08-24 LAB — CMP14+EGFR
ALT: 14 IU/L (ref 0–44)
AST: 12 IU/L (ref 0–40)
Albumin/Globulin Ratio: 1.5 (ref 1.2–2.2)
Albumin: 4 g/dL (ref 3.7–4.7)
Alkaline Phosphatase: 104 IU/L (ref 39–117)
BUN/Creatinine Ratio: 15 (ref 10–24)
BUN: 12 mg/dL (ref 8–27)
Bilirubin Total: 0.5 mg/dL (ref 0.0–1.2)
CO2: 25 mmol/L (ref 20–29)
Calcium: 9.5 mg/dL (ref 8.6–10.2)
Chloride: 100 mmol/L (ref 96–106)
Creatinine, Ser: 0.78 mg/dL (ref 0.76–1.27)
GFR calc Af Amer: 105 mL/min/{1.73_m2} (ref >59)
GFR calc non Af Amer: 91 mL/min/{1.73_m2} (ref >59)
Globulin, Total: 2.7 g/dL (ref 1.5–4.5)
Glucose: 136 mg/dL — ABNORMAL HIGH (ref 65–99)
Potassium: 3.9 mmol/L (ref 3.5–5.2)
Sodium: 141 mmol/L (ref 134–144)
Total Protein: 6.7 g/dL (ref 6.0–8.5)

## 2019-08-24 LAB — CBC WITH DIFFERENTIAL/PLATELET
Basophils Absolute: 0.1 10*3/uL (ref 0.0–0.2)
Basos: 1 %
EOS (ABSOLUTE): 0.3 10*3/uL (ref 0.0–0.4)
Eos: 2 %
Hematocrit: 45.9 % (ref 37.5–51.0)
Hemoglobin: 15.6 g/dL (ref 13.0–17.7)
Immature Grans (Abs): 0 10*3/uL (ref 0.0–0.1)
Immature Granulocytes: 0 %
Lymphocytes Absolute: 2.4 10*3/uL (ref 0.7–3.1)
Lymphs: 21 %
MCH: 29.7 pg (ref 26.6–33.0)
MCHC: 34 g/dL (ref 31.5–35.7)
MCV: 87 fL (ref 79–97)
Monocytes Absolute: 1.1 10*3/uL — ABNORMAL HIGH (ref 0.1–0.9)
Monocytes: 10 %
Neutrophils Absolute: 7.5 10*3/uL — ABNORMAL HIGH (ref 1.4–7.0)
Neutrophils: 66 %
Platelets: 391 10*3/uL (ref 150–450)
RBC: 5.26 x10E6/uL (ref 4.14–5.80)
RDW: 12.6 % (ref 11.6–15.4)
WBC: 11.3 10*3/uL — ABNORMAL HIGH (ref 3.4–10.8)

## 2019-08-28 ENCOUNTER — Other Ambulatory Visit: Payer: Self-pay | Admitting: Family

## 2019-08-28 DIAGNOSIS — F419 Anxiety disorder, unspecified: Secondary | ICD-10-CM

## 2019-08-28 DIAGNOSIS — R0602 Shortness of breath: Secondary | ICD-10-CM

## 2019-08-28 DIAGNOSIS — M7989 Other specified soft tissue disorders: Secondary | ICD-10-CM

## 2019-08-28 DIAGNOSIS — F32A Depression, unspecified: Secondary | ICD-10-CM

## 2019-08-28 DIAGNOSIS — F329 Major depressive disorder, single episode, unspecified: Secondary | ICD-10-CM

## 2019-08-28 DIAGNOSIS — I509 Heart failure, unspecified: Secondary | ICD-10-CM

## 2019-08-28 DIAGNOSIS — K219 Gastro-esophageal reflux disease without esophagitis: Secondary | ICD-10-CM

## 2019-08-29 ENCOUNTER — Telehealth: Payer: Self-pay

## 2019-08-29 ENCOUNTER — Other Ambulatory Visit: Payer: Self-pay | Admitting: Family

## 2019-08-29 DIAGNOSIS — E1142 Type 2 diabetes mellitus with diabetic polyneuropathy: Secondary | ICD-10-CM

## 2019-08-29 MED ORDER — AZITHROMYCIN 250 MG PO TABS: ORAL_TABLET | ORAL | 0 refills | Status: DC

## 2019-08-29 MED ORDER — PREDNISONE 10 MG (21) PO TBPK: ORAL_TABLET | ORAL | 0 refills | Status: DC

## 2019-08-29 NOTE — Telephone Encounter (Signed)
Patient is calling to let you know he needs to have a blood sugar monitor and testing supplies prescribed.  Also, they thought you may have mentioned starting him on B12 injections.  I do not see anything about this in the notes or labs to check B12 level.  Please advise.

## 2019-08-30 MED ORDER — BLOOD GLUCOSE METER KIT: PACK | 0 refills | Status: DC

## 2019-08-30 NOTE — Addendum Note (Signed)
Addended by: [name] A on: 08/30/2019 03:28 PM   Modules accepted: Orders

## 2019-08-30 NOTE — Telephone Encounter (Signed)
Patient would like a rx sent to pharmacy for b12. Naomie daughter in law is a Marine scientist.

## 2019-08-30 NOTE — Telephone Encounter (Signed)
Glucose meter printed, please fax to pharmacy for patient. Please set up monthly B12 injection for patient.

## 2019-08-31 ENCOUNTER — Institutional Professional Consult (permissible substitution): Payer: Medicare HMO | Admitting: Internal Medicine

## 2019-09-01 DIAGNOSIS — R69 Illness, unspecified: Secondary | ICD-10-CM | POA: Diagnosis not present

## 2019-09-02 MED ORDER — CYANOCOBALAMIN 1000 MCG/ML IJ SOLN: 1000.0000 ug | Freq: Once | INTRAMUSCULAR | 6 refills | Status: AC

## 2019-09-02 NOTE — Addendum Note (Signed)
Addended by: [name] A on: 09/02/2019 02:13 PM   Modules accepted: Orders

## 2019-09-02 NOTE — Telephone Encounter (Signed)
Prescription sent to pharmacy.

## 2019-09-04 DIAGNOSIS — J439 Emphysema, unspecified: Secondary | ICD-10-CM | POA: Diagnosis not present

## 2019-09-14 ENCOUNTER — Telehealth: Payer: Self-pay | Admitting: Family

## 2019-09-14 ENCOUNTER — Other Ambulatory Visit: Payer: Self-pay | Admitting: Family

## 2019-09-14 DIAGNOSIS — F329 Major depressive disorder, single episode, unspecified: Secondary | ICD-10-CM

## 2019-09-14 DIAGNOSIS — F32A Depression, unspecified: Secondary | ICD-10-CM

## 2019-09-14 NOTE — Telephone Encounter (Signed)
LMOVM will send to provider Needing to change pharmacy d/t insurance RF was done 08/29/19 #90 with a refill to [facility] but [facility]

## 2019-09-14 NOTE — Telephone Encounter (Signed)
  Prescription Request  09/14/2019  What is the name of the medication or equipment? xanax  Have you contacted your pharmacy to request a refill? (if applicable) NO  Which pharmacy would you like this sent to? [facility] [place]. PT SAID THAT HE WAS USING [facility] BUT HIS INSURANCE WILL NOT ALLOW HIM TO GO THERE ANYMORE. HE NEEDS [facility] MEDS [facility] TO [facility] [place] [facility] NOW ON   Patient notified that their request is being sent to the clinical staff for review and that they should receive a response within 2 business days.

## 2019-09-15 MED ORDER — ALPRAZOLAM 0.5 MG PO TABS: 0.5000 mg | ORAL_TABLET | Freq: Three times a day (TID) | ORAL | 1 refills | Status: DC | PRN

## 2019-09-15 NOTE — Addendum Note (Signed)
Addended by: [name] A on: 09/15/2019 03:42 PM   Modules accepted: Orders

## 2019-09-15 NOTE — Telephone Encounter (Signed)
Prescription sent to pharmacy.

## 2019-09-20 ENCOUNTER — Institutional Professional Consult (permissible substitution): Payer: Medicare HMO | Admitting: Internal Medicine

## 2019-09-23 ENCOUNTER — Other Ambulatory Visit: Payer: Self-pay | Admitting: Family

## 2019-10-05 DIAGNOSIS — J439 Emphysema, unspecified: Secondary | ICD-10-CM | POA: Diagnosis not present

## 2019-10-07 ENCOUNTER — Other Ambulatory Visit: Payer: Self-pay | Admitting: Family

## 2019-10-17 ENCOUNTER — Institutional Professional Consult (permissible substitution): Payer: Medicare HMO | Admitting: Internal Medicine

## 2019-10-31 ENCOUNTER — Institutional Professional Consult (permissible substitution): Payer: Medicare HMO | Admitting: Internal Medicine

## 2019-11-02 ENCOUNTER — Other Ambulatory Visit: Payer: Self-pay | Admitting: Family

## 2019-11-04 DIAGNOSIS — J439 Emphysema, unspecified: Secondary | ICD-10-CM | POA: Diagnosis not present

## 2019-11-08 ENCOUNTER — Other Ambulatory Visit: Payer: Self-pay | Admitting: Family

## 2019-11-08 DIAGNOSIS — J439 Emphysema, unspecified: Secondary | ICD-10-CM

## 2019-11-25 ENCOUNTER — Ambulatory Visit: Payer: Medicare HMO | Admitting: Family

## 2019-11-29 ENCOUNTER — Encounter: Payer: Self-pay | Admitting: Family

## 2019-12-05 DIAGNOSIS — J439 Emphysema, unspecified: Secondary | ICD-10-CM | POA: Diagnosis not present

## 2019-12-08 ENCOUNTER — Other Ambulatory Visit: Payer: Self-pay

## 2019-12-08 ENCOUNTER — Ambulatory Visit (INDEPENDENT_AMBULATORY_CARE_PROVIDER_SITE_OTHER): Payer: Medicare HMO | Admitting: Internal Medicine

## 2019-12-08 ENCOUNTER — Encounter: Payer: Self-pay | Admitting: Internal Medicine

## 2019-12-08 DIAGNOSIS — J9611 Chronic respiratory failure with hypoxia: Secondary | ICD-10-CM | POA: Diagnosis not present

## 2019-12-08 DIAGNOSIS — F17210 Nicotine dependence, cigarettes, uncomplicated: Secondary | ICD-10-CM | POA: Diagnosis not present

## 2019-12-08 DIAGNOSIS — J439 Emphysema, unspecified: Secondary | ICD-10-CM | POA: Diagnosis not present

## 2019-12-08 DIAGNOSIS — R69 Illness, unspecified: Secondary | ICD-10-CM | POA: Diagnosis not present

## 2019-12-08 DIAGNOSIS — J398 Other specified diseases of upper respiratory tract: Secondary | ICD-10-CM | POA: Diagnosis not present

## 2019-12-08 MED ORDER — TRELEGY ELLIPTA 100-62.5-25 MCG/INH IN AEPB: 1.0000 | INHALATION_SPRAY | Freq: Every day | RESPIRATORY_TRACT | 0 refills | Status: DC

## 2019-12-08 MED ORDER — TRELEGY ELLIPTA 100-62.5-25 MCG/INH IN AEPB: INHALATION_SPRAY | RESPIRATORY_TRACT | 11 refills | Status: DC

## 2019-12-08 MED ORDER — TRELEGY ELLIPTA 100-62.5-25 MCG/INH IN AEPB: 1.0000 | INHALATION_SPRAY | Freq: Every day | RESPIRATORY_TRACT | 11 refills | Status: DC
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
## 2019-12-08 NOTE — Patient Instructions (Addendum)
Ok to try trelegy one click daily x 2 weeks and fill the prescription only if you think it helps - remember to rinse and gargle after use   Only use your albuterol as a rescue medication to be used if you can't catch your breath by resting or doing a relaxed purse lip breathing pattern.  - The less you use it, the better it will work when you need it. - Ok to use up to 2 puffs  every 4 hours if you must but call for immediate appointment if use goes up over your usual need - Don't leave home without it !!  (think of it like the spare tire for your car)   >>> Work on inhaler technique:  relax and gently blow all the way out then take a nice smooth deep breath back in, triggering the inhaler at same time you start breathing in.   Blow out thru nose. Rinse and gargle with water when done  The key is to stop smoking completely before smoking completely stops you!  I strongly recommend the pfizer and moderna vaccinations as soon as you can especially if you hear about the Delta variant   See Dr Carol Ada asap  [phone]   If not better after albuterol spray then use the nebulizer up to every 4 hours with albuterol.   Please schedule a follow up visit in 3 months but call sooner if needed with PFTs

## 2019-12-09 ENCOUNTER — Encounter: Payer: Self-pay | Admitting: Internal Medicine

## 2019-12-09 ENCOUNTER — Other Ambulatory Visit: Payer: Self-pay | Admitting: Family

## 2019-12-09 DIAGNOSIS — F32A Depression, unspecified: Secondary | ICD-10-CM

## 2019-12-09 DIAGNOSIS — F419 Anxiety disorder, unspecified: Secondary | ICD-10-CM

## 2019-12-09 DIAGNOSIS — J398 Other specified diseases of upper respiratory tract: Secondary | ICD-10-CM | POA: Insufficient documentation

## 2019-12-09 NOTE — Assessment & Plan Note (Signed)
Started on 02 07/19/2014  - as of 12/08/2019 using 02 at hs and prn daytime  Says using 5 lpm hs and sleeping fine without am ha or ams so probably ok for now, esp until his tracheal stenosis is addressed  In meantime advised:   Make sure you check your oxygen saturations at highest level of activity to be sure it stays over 90% and adjust upward to maintain this level if needed but remember to turn it back to previous settings when you stop (to conserve your supply).

## 2019-12-09 NOTE — Assessment & Plan Note (Signed)
Counseled re importance of smoking cessation but did not meet time criteria for separate billing            Each maintenance medication was reviewed in detail including emphasizing most importantly the difference between maintenance and prns and under what circumstances the prns are to be triggered using an action plan format where appropriate.  Total time for H and P, chart review, counseling, teaching device and generating customized AVS unique to this office visit / charting = 45 min      
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
## 2019-12-09 NOTE — Assessment & Plan Note (Signed)
Active smoker  - 12/08/2019  After extensive coaching inhaler device,  effectiveness =    90% with elipta > try trelegy       Group D in terms of symptom/risk and laba/lama/ICS  therefore appropriate rx at this point >>>  Try trelegy one click each am and approp saba  I spent extra time with pt today reviewing appropriate use of albuterol for prn use on exertion with the following points: 1) saba is for relief of sob that does not improve by walking a slower pace or resting but rather if the pt does not improve after trying this first. 2) If the pt is convinced, as many are, that saba helps recover from activity faster then it's easy to tell if this is the case by re-challenging : ie stop, take the inhaler, then p 5 minutes try the exact same activity (intensity of workload) that just caused the symptoms and see if they are substantially diminished or not after saba 3) if there is an activity that reproducibly causes the symptoms, try the saba 15 min before the activity on alternate days   If in fact the saba really does help, then fine to continue to use it prn but advised may need to look closer at the maintenance regimen being used to achieve better control of airways disease with exertion.

## 2019-12-10 ENCOUNTER — Other Ambulatory Visit: Payer: Self-pay | Admitting: Family

## 2019-12-10 DIAGNOSIS — F419 Anxiety disorder, unspecified: Secondary | ICD-10-CM

## 2019-12-10 DIAGNOSIS — F32A Depression, unspecified: Secondary | ICD-10-CM

## 2019-12-13 ENCOUNTER — Ambulatory Visit: Payer: Medicare HMO | Admitting: Family

## 2019-12-13 ENCOUNTER — Other Ambulatory Visit: Payer: Self-pay | Admitting: Family

## 2019-12-13 DIAGNOSIS — E785 Hyperlipidemia, unspecified: Secondary | ICD-10-CM

## 2019-12-13 DIAGNOSIS — I152 Hypertension secondary to endocrine disorders: Secondary | ICD-10-CM

## 2019-12-15 ENCOUNTER — Other Ambulatory Visit: Payer: Self-pay

## 2019-12-15 ENCOUNTER — Encounter: Payer: Self-pay | Admitting: Family

## 2019-12-15 ENCOUNTER — Ambulatory Visit (INDEPENDENT_AMBULATORY_CARE_PROVIDER_SITE_OTHER): Payer: Medicare HMO | Admitting: Family

## 2019-12-15 VITALS — BP 177/80 | HR 80 | Temp 98.0°F | Ht 69.0 in | Wt 183.4 lb

## 2019-12-15 DIAGNOSIS — F419 Anxiety disorder, unspecified: Secondary | ICD-10-CM

## 2019-12-15 DIAGNOSIS — F331 Major depressive disorder, recurrent, moderate: Secondary | ICD-10-CM

## 2019-12-15 DIAGNOSIS — I152 Hypertension secondary to endocrine disorders: Secondary | ICD-10-CM

## 2019-12-15 DIAGNOSIS — Z91199 Patient's noncompliance with other medical treatment and regimen due to unspecified reason: Secondary | ICD-10-CM

## 2019-12-15 DIAGNOSIS — K219 Gastro-esophageal reflux disease without esophagitis: Secondary | ICD-10-CM

## 2019-12-15 DIAGNOSIS — I509 Heart failure, unspecified: Secondary | ICD-10-CM

## 2019-12-15 DIAGNOSIS — I2510 Atherosclerotic heart disease of native coronary artery without angina pectoris: Secondary | ICD-10-CM | POA: Diagnosis not present

## 2019-12-15 DIAGNOSIS — I700 Atherosclerosis of aorta: Secondary | ICD-10-CM

## 2019-12-15 DIAGNOSIS — F32A Depression, unspecified: Secondary | ICD-10-CM

## 2019-12-15 DIAGNOSIS — F17210 Nicotine dependence, cigarettes, uncomplicated: Secondary | ICD-10-CM

## 2019-12-15 DIAGNOSIS — E1142 Type 2 diabetes mellitus with diabetic polyneuropathy: Secondary | ICD-10-CM | POA: Diagnosis not present

## 2019-12-15 DIAGNOSIS — F1320 Sedative, hypnotic or anxiolytic dependence, uncomplicated: Secondary | ICD-10-CM

## 2019-12-15 DIAGNOSIS — I10 Essential (primary) hypertension: Secondary | ICD-10-CM | POA: Diagnosis not present

## 2019-12-15 DIAGNOSIS — E1159 Type 2 diabetes mellitus with other circulatory complications: Secondary | ICD-10-CM

## 2019-12-15 DIAGNOSIS — J439 Emphysema, unspecified: Secondary | ICD-10-CM

## 2019-12-15 DIAGNOSIS — F329 Major depressive disorder, single episode, unspecified: Secondary | ICD-10-CM

## 2019-12-15 DIAGNOSIS — Z9119 Patient's noncompliance with other medical treatment and regimen: Secondary | ICD-10-CM

## 2019-12-15 DIAGNOSIS — R69 Illness, unspecified: Secondary | ICD-10-CM | POA: Diagnosis not present

## 2019-12-15 LAB — BAYER DCA HB A1C WAIVED: HB A1C (BAYER DCA - WAIVED): 6.6 % (ref <7.0)

## 2019-12-15 MED ORDER — ALPRAZOLAM 0.5 MG PO TABS: 0.5000 mg | ORAL_TABLET | Freq: Two times a day (BID) | ORAL | 2 refills | Status: DC | PRN

## 2019-12-15 MED ORDER — ESCITALOPRAM OXALATE 20 MG PO TABS: 20.0000 mg | ORAL_TABLET | Freq: Every day | ORAL | 1 refills | Status: DC

## 2019-12-15 NOTE — Patient Instructions (Signed)

## 2019-12-15 NOTE — Progress Notes (Signed)
Subjective:    Patient ID: Ronald [name], male    DOB: 04/24/1946, 74 y.o.   MRN: [id number]  Chief Complaint  Patient presents with  . Medical Management of Chronic Issues   PT presents to the office today for chronic follow up. PT is followed by Cardiologists. Pt's BP is elevated today. He is noncompliant with his medications and appointments. He can not read and this does make his medication adherence difficult at times. His ex-wife does his medications for him. She states they never heard from the Pulmonologist referral.  Hypertension This is a chronic problem. The current episode started more than 1 year ago. The problem has been waxing and waning since onset. The problem is uncontrolled. Associated symptoms include blurred vision, malaise/fatigue, peripheral edema and shortness of breath. Risk factors for coronary artery disease include dyslipidemia, obesity, male gender, smoking/tobacco exposure and sedentary lifestyle. The current treatment provides moderate improvement. Hypertensive end-organ damage includes CAD/MI.  Congestive Heart Failure Presents for follow-up visit. Associated symptoms include edema, fatigue, orthopnea and shortness of breath. Pertinent negatives include no abdominal pain. The symptoms have been stable.  Gastroesophageal Reflux He complains of belching and heartburn. He reports no abdominal pain. This is a chronic problem. The current episode started more than 1 year ago. The problem occurs occasionally. The problem has been waxing and waning. Associated symptoms include fatigue and orthopnea. Risk factors include obesity. He has tried a PPI for the symptoms. The treatment provided moderate relief.  Diabetes He presents for his follow-up diabetic visit. He has type 2 diabetes mellitus. His disease course has been stable. There are no hypoglycemic associated symptoms. Associated symptoms include blurred vision, fatigue and foot paresthesias. Symptoms are stable.  Diabetic complications include heart disease, nephropathy and peripheral neuropathy. Risk factors for coronary artery disease include dyslipidemia, male sex, hypertension and sedentary lifestyle. (Does not check BS regularly ) An ACE inhibitor/angiotensin II receptor blocker is being taken. Eye exam is not current.  Depression        This is a chronic problem.  The current episode started more than 1 year ago.   The onset quality is gradual.   The problem occurs intermittently.  Associated symptoms include fatigue, irritable, restlessness and decreased interest.  Associated symptoms include no helplessness and no hopelessness.     The symptoms are aggravated by family issues. Nicotine Dependence Presents for follow-up visit. Symptoms include fatigue. His urge triggers include company of smokers. He smokes 1 pack (1 1/2 pack) of cigarettes per day.  COPD PT continues to smoke 1 1/2 packs a day. Has SOB with exertion. Takes Trelegy daily.     Review of Systems  Constitutional: Positive for fatigue and malaise/fatigue.  Eyes: Positive for blurred vision.  Respiratory: Positive for shortness of breath.   Gastrointestinal: Positive for heartburn. Negative for abdominal pain.  Psychiatric/Behavioral: Positive for depression.  All other systems reviewed and are negative.      Objective:   Physical Exam Vitals reviewed.  Constitutional:      General: He is irritable. He is not in acute distress.    Appearance: He is well-developed.  HENT:     Head: Normocephalic.     Right Ear: Tympanic membrane normal.     Left Ear: Tympanic membrane normal.  Eyes:     General:        Right eye: No discharge.        Left eye: No discharge.     Pupils: Pupils are equal, round,  and reactive to light.  Neck:     Thyroid: No thyromegaly.  Cardiovascular:     Rate and Rhythm: Normal rate and regular rhythm.     Heart sounds: Murmur heard.   Pulmonary:     Effort: Pulmonary effort is normal. No  respiratory distress.     Breath sounds: Rhonchi present. No wheezing.  Abdominal:     General: Bowel sounds are normal. There is no distension.     Palpations: Abdomen is soft.     Tenderness: There is no abdominal tenderness.  Musculoskeletal:        General: No tenderness.     Cervical back: Normal range of motion and neck supple.     Comments: bilateral swelling in arms  Skin:    General: Skin is warm and dry.     Coloration: Skin is pale.     Findings: No erythema or rash.  Neurological:     Mental Status: He is alert and oriented to person, place, and time.     Cranial Nerves: No cranial nerve deficit.     Deep Tendon Reflexes: Reflexes are normal and symmetric.  Psychiatric:        Behavior: Behavior normal.        Thought Content: Thought content normal.        Judgment: Judgment normal.          BP (!) 177/80   Pulse 80   Temp 98 F (36.7 C) (Temporal)   Ht 5' 9"  (1.753 m)   Wt 183 lb 6.4 oz (83.2 kg)   SpO2 94%   BMI 27.08 kg/m   Assessment & Plan:  [name] comes in today with chief complaint of Medical Management of Chronic Issues   Diagnosis and orders addressed:  1. Aortic atherosclerosis (HCC) - CMP14+EGFR - CBC with Differential/Platelet  2. CAD in native artery - CMP14+EGFR - CBC with Differential/Platelet  3. Congestive heart failure, unspecified HF chronicity, unspecified heart failure type ([place]) - CMP14+EGFR - CBC with Differential/Platelet  4. Hypertension associated with diabetes ([place]) - CMP14+EGFR - CBC with Differential/Platelet  5. Pulmonary emphysema, unspecified emphysema type ([place]) - CMP14+EGFR - CBC with Differential/Platelet  6. Gastroesophageal reflux disease, unspecified whether esophagitis present - CMP14+EGFR - CBC with Differential/Platelet  7. Type 2 diabetes mellitus with diabetic polyneuropathy, without long-term current use of insulin (HCC) - Bayer DCA Hb A1c Waived - CMP14+EGFR - CBC with  Differential/Platelet - Lipid panel - Microalbumin / creatinine urine ratio  8. Diabetic polyneuropathy associated with type 2 diabetes mellitus (HCC) - CMP14+EGFR - CBC with Differential/Platelet  9. Anxiety - CMP14+EGFR - CBC with Differential/Platelet - ALPRAZolam (XANAX) 0.5 MG tablet; Take 1 tablet (0.5 mg total) by mouth 2 (two) times daily as needed. for anxiety  Dispense: 60 tablet; Refill: 2 - escitalopram (LEXAPRO) 20 MG tablet; Take 1 tablet (20 mg total) by mouth daily.  Dispense: 90 tablet; Refill: 1  10. Cigarette smoker - CMP14+EGFR - CBC with Differential/Platelet  11. Moderate episode of recurrent major depressive disorder (HCC) - CMP14+EGFR - CBC with Differential/Platelet  12. Noncompliance - CMP14+EGFR - CBC with Differential/Platelet  13. Anxiety and depression - CMP14+EGFR - CBC with Differential/Platelet - escitalopram (LEXAPRO) 20 MG tablet; Take 1 tablet (20 mg total) by mouth daily.  Dispense: 90 tablet; Refill: 1  14. Benzodiazepine dependence (HCC) - CMP14+EGFR - CBC with Differential/Platelet - ALPRAZolam (XANAX) 0.5 MG tablet; Take 1 tablet (0.5 mg total) by mouth 2 (two) times daily as  needed. for anxiety  Dispense: 60 tablet; Refill: 2   Labs pending Will decrease xanax to #60 from #90, drug screen done today.  Health Maintenance reviewed Diet and exercise encouraged  Follow up plan: 3 months   [name], FNP

## 2019-12-16 ENCOUNTER — Other Ambulatory Visit: Payer: Self-pay | Admitting: Family

## 2019-12-16 LAB — CBC WITH DIFFERENTIAL/PLATELET
Basophils Absolute: 0.1 x10E3/uL (ref 0.0–0.2)
Basos: 1 %
EOS (ABSOLUTE): 0.3 x10E3/uL (ref 0.0–0.4)
Eos: 3 %
Hematocrit: 42.7 % (ref 37.5–51.0)
Hemoglobin: 14.8 g/dL (ref 13.0–17.7)
Immature Grans (Abs): 0.1 x10E3/uL (ref 0.0–0.1)
Immature Granulocytes: 1 %
Lymphocytes Absolute: 1.9 x10E3/uL (ref 0.7–3.1)
Lymphs: 18 %
MCH: 29.7 pg (ref 26.6–33.0)
MCHC: 34.7 g/dL (ref 31.5–35.7)
MCV: 86 fL (ref 79–97)
Monocytes Absolute: 0.8 x10E3/uL (ref 0.1–0.9)
Monocytes: 8 %
Neutrophils Absolute: 7.4 x10E3/uL — ABNORMAL HIGH (ref 1.4–7.0)
Neutrophils: 69 %
Platelets: 392 x10E3/uL (ref 150–450)
RBC: 4.99 x10E6/uL (ref 4.14–5.80)
RDW: 13.1 % (ref 11.6–15.4)
WBC: 10.6 x10E3/uL (ref 3.4–10.8)

## 2019-12-16 LAB — CMP14+EGFR
ALT: 17 IU/L (ref 0–44)
AST: 16 IU/L (ref 0–40)
Albumin/Globulin Ratio: 1.4 (ref 1.2–2.2)
Albumin: 3.9 g/dL (ref 3.7–4.7)
Alkaline Phosphatase: 117 IU/L (ref 48–121)
BUN/Creatinine Ratio: 11 (ref 10–24)
BUN: 9 mg/dL (ref 8–27)
Bilirubin Total: 0.8 mg/dL (ref 0.0–1.2)
CO2: 30 mmol/L — ABNORMAL HIGH (ref 20–29)
Calcium: 8.9 mg/dL (ref 8.6–10.2)
Chloride: 98 mmol/L (ref 96–106)
Creatinine, Ser: 0.81 mg/dL (ref 0.76–1.27)
GFR calc Af Amer: 103 mL/min/{1.73_m2} (ref >59)
GFR calc non Af Amer: 89 mL/min/{1.73_m2} (ref >59)
Globulin, Total: 2.7 g/dL (ref 1.5–4.5)
Glucose: 164 mg/dL — ABNORMAL HIGH (ref 65–99)
Potassium: 3.3 mmol/L — ABNORMAL LOW (ref 3.5–5.2)
Sodium: 142 mmol/L (ref 134–144)
Total Protein: 6.6 g/dL (ref 6.0–8.5)

## 2019-12-16 LAB — LIPID PANEL
Chol/HDL Ratio: 4.3 ratio (ref 0.0–5.0)
Cholesterol, Total: 120 mg/dL (ref 100–199)
HDL: 28 mg/dL — ABNORMAL LOW (ref >39)
LDL Chol Calc (NIH): 63 mg/dL (ref 0–99)
Triglycerides: 171 mg/dL — ABNORMAL HIGH (ref 0–149)
VLDL Cholesterol Cal: 29 mg/dL (ref 5–40)

## 2019-12-16 LAB — MICROALBUMIN / CREATININE URINE RATIO
Creatinine, Urine: 118.6 mg/dL
Microalb/Creat Ratio: 9 mg/g{creat} (ref 0–29)
Microalbumin, Urine: 10.4 ug/mL

## 2019-12-20 ENCOUNTER — Telehealth: Payer: Self-pay | Admitting: Internal Medicine

## 2019-12-20 ENCOUNTER — Telehealth: Payer: Self-pay

## 2019-12-20 LAB — TOXASSURE SELECT 13 (MW), URINE

## 2019-12-21 MED ORDER — TRELEGY ELLIPTA 100-62.5-25 MCG/INH IN AEPB: 1.0000 | INHALATION_SPRAY | Freq: Every day | RESPIRATORY_TRACT | 0 refills | Status: DC

## 2019-12-21 NOTE — Telephone Encounter (Signed)
Pt came and picked up sample

## 2019-12-21 NOTE — Telephone Encounter (Signed)
1 sample left up front at [place] office  ATC the pt, NA and VM was full

## 2019-12-29 NOTE — Telephone Encounter (Signed)
Error

## 2020-01-04 ENCOUNTER — Ambulatory Visit (INDEPENDENT_AMBULATORY_CARE_PROVIDER_SITE_OTHER): Payer: Medicare HMO

## 2020-01-04 DIAGNOSIS — Z0000 Encounter for general adult medical examination without abnormal findings: Secondary | ICD-10-CM

## 2020-01-04 DIAGNOSIS — J439 Emphysema, unspecified: Secondary | ICD-10-CM | POA: Diagnosis not present

## 2020-01-04 NOTE — Progress Notes (Addendum)
MEDICARE ANNUAL WELLNESS VISIT  01/04/2020  Telephone Visit Disclaimer This Medicare AWV was conducted by telephone due to national recommendations for restrictions regarding the COVID-19 Pandemic (e.g. social distancing).  I verified, using two identifiers, that I am speaking with Ronald [name] or their authorized healthcare agent. I discussed the limitations, risks, security, and privacy concerns of performing an evaluation and management service by telephone and the potential availability of an in-person appointment in the future. The patient expressed understanding and agreed to proceed.   Subjective:  Ronald [name] is a 74 y.o. male patient of Hawks, Theador Hawthorne, FNP who had a Medicare Annual Wellness Visit today via telephone. Tison is Retired and lives with their partner. He has two children. He reports that he is socially active and does interact with friends/family regularly. He is minimally physically active and enjoys spending time with family and friends.  Patient Care Team: [name], FNP as PCP - General (Family Medicine) Herminio Commons, MD (Inactive) as PCP - Cardiology (Cardiology)  Advanced Directives 01/04/2020 12/24/2018 08/23/2016 07/12/2014 06/22/2014  Does Patient Have a Medical Advance Directive? _0   Would patient like information on creating a medical advance directive? No - Patient declined No - Patient declined - No - patient declined information No - patient declined information    Hospital Utilization Over the Past 12 Months: # of hospitalizations or ER visits: 0 # of surgeries: 0  Review of Systems    Patient reports that his overall health is unchanged compared to last year.  History obtained from chart review and the patient  Patient Reported Readings (BP, Pulse, CBG, Weight, etc) none  Pain Assessment Pain : No/denies pain     Current Medications & Allergies (verified) Allergies as of 01/04/2020       Reactions   Gabapentin  Anxiety   Unknown reaction        Medication List        Accurate as of January 04, 2020  1:59 PM. If you have any questions, ask your nurse or doctor.          albuterol 108 (90 Base) MCG/ACT inhaler Commonly known as: VENTOLIN HFA INHALE 2 PUFFS EVERY 6 HOURS AS NEEEDED   ALPRAZolam 0.5 MG tablet Commonly known as: XANAX Take 1 tablet (0.5 mg total) by mouth 2 (two) times daily as needed. for anxiety   amLODipine 5 MG tablet Commonly known as: NORVASC TAKE 1 TABLET BY MOUTH DAILY   aspirin EC 81 MG tablet Take 1 tablet (81 mg total) by mouth daily.   atorvastatin 40 MG tablet Commonly known as: LIPITOR TAKE 1 TABLET BY MOUTH DAILY AT 6 PM FOR CHOLESTEROL   blood glucose meter kit and supplies Dispense based on patient and insurance preference. Use up to four times daily as directed. (FOR ICD-10 E10.9, E11.9).   escitalopram 20 MG tablet Commonly known as: Lexapro Take 1 tablet (20 mg total) by mouth daily.   furosemide 20 MG tablet Commonly known as: LASIX TAKE 1 TABLET BY MOUTH TWICE DAILY   glucose blood test strip Commonly known as: ONE TOUCH ULTRA TEST USE TO CHECK GLUCOSE ONCE DAILY   ipratropium-albuterol 0.5-2.5 (3) MG/3ML Soln Commonly known as: DUONEB USE ONE VIAL BY NEBULIZER FOUR TIMES DAILY   losartan 100 MG tablet Commonly known as: COZAAR Take 1 tablet (100 mg total) by mouth daily.   metoprolol tartrate 50 MG tablet Commonly known as: LOPRESSOR TAKE 1  TABLET BY MOUTH TWICE DAILY FOR BLOOD PRESSURE   mirtazapine 15 MG tablet Commonly known as: REMERON TAKE 1 TABLET BY MOUTH EVERY DAY FOR SLEEP   nitroGLYCERIN 0.4 MG SL tablet Commonly known as: NITROSTAT DISSOLVE TAKE 1 TABLET UNDER THE TONGUE EVERY FIVE MINUTES AS NEEDED FOR CHEST PAIN   onetouch ultrasoft lancets Use as instructed   DX E11.9   pantoprazole 40 MG tablet Commonly known as: PROTONIX TAKE 1 TABLET BY MOUTH EVERY DAY   potassium chloride SA 20 MEQ tablet Commonly  known as: KLOR-CON TAKE 1 TABLET BY MOUTH DAILY   sitaGLIPtin 50 MG tablet Commonly known as: JANUVIA Take 50 mg by mouth daily.   Trelegy Ellipta 100-62.5-25 MCG/INH Aepb Generic drug: Fluticasone-Umeclidin-Vilant One click each am and take two good deep drags   Trelegy Ellipta 100-62.5-25 MCG/INH Aepb Generic drug: Fluticasone-Umeclidin-Vilant Inhale 1 puff into the lungs daily.        History (reviewed): Past Medical History:  Diagnosis Date   Anxiety    Asthma    Chronic lower back pain    COPD (chronic obstructive pulmonary disease) (HCC)    Coronary artery disease    Depression    GERD (gastroesophageal reflux disease)    High cholesterol    Hypertension    NSTEMI (non-ST elevated myocardial infarction) ([place]) 05/2014   with stent placement   Stroke ([place])    anyeusym    TIA (transient ischemic attack)    "they say I've had some mini strokes; don't know when"; denies residual on 06/22/2014)   Type II diabetes mellitus ([place])    Ulcerative colitis ([place])    Past Surgical History:  Procedure Laterality Date   APPENDECTOMY     CARDIAC CATHETERIZATION  1990's X 3   CHOLECYSTECTOMY     CORONARY ANGIOPLASTY WITH STENT PLACEMENT  05/2014   "2"   TUMOR EXCISION Right ~ 1999   "side of my upper head"   Family History  Problem Relation Age of Onset   CAD Father    Lung cancer Brother        smoked   Cancer Brother        lung   Leukemia Sister    Dementia Sister    Stroke Mother    Emphysema Sister    Cancer Brother        lung   Social History   Socioeconomic History   Marital status: Divorced    Spouse name: Not on file   Number of children: 2   Years of education: Not on file   Highest education level: Not on file  Occupational History   Occupation: Retired    Comment: Aeronautical engineer  Tobacco Use   Smoking status: Current Every Day Smoker    Packs/day: 1.50    Years: 48.00    Pack years: 72.00    Types: Cigarettes    Start date: 02/12/1966    Smokeless tobacco: Never Used  Vaping Use   Vaping Use: Never used  Substance and Sexual Activity   Alcohol use: Not Currently    Alcohol/week: 0.0 standard drinks   Drug use: No   Sexual activity: Never  Other Topics Concern   Not on file  Social History Narrative   Staying with a friend .   Has 2 children    Divorced    Social Determinants of Radio broadcast assistant Strain:    Difficulty of Paying Living Expenses:   Food Insecurity:    Worried About Running  Out of Food in the Last Year:    Ran Out of Food in the Last Year:   Transportation Needs:    Film/video editor (Medical):    Lack of Transportation (Non-Medical):   Physical Activity:    Days of Exercise per Week:    Minutes of Exercise per Session:   Stress:    Feeling of Stress :   Social Connections:    Frequency of Communication with Friends and Family:    Frequency of Social Gatherings with Friends and Family:    Attends Religious Services:    Active Member of Clubs or Organizations:    Attends Archivist Meetings:    Marital Status:     Activities of Daily Living In your present state of health, do you have any difficulty performing the following activities: 01/04/2020  Hearing? N  Vision? N  Difficulty concentrating or making decisions? N  Walking or climbing stairs? N  Dressing or bathing? N  Doing errands, shopping? N  Preparing Food and eating ? N  Using the Toilet? N  In the past six months, have you accidently leaked urine? N  Do you have problems with loss of bowel control? N  Managing your Medications? Y  Comment someone helps sort medications for the week  Managing your Finances? N  Housekeeping or managing your Housekeeping? N  Some recent data might be hidden   Patient's friend helps manage medications sorting them in to a weekly container.  Patient Education/ Literacy How often do you need to have someone help you when you read instructions, pamphlets, or other  written materials from your doctor or pharmacy?: 2 - Rarely What is the last grade level you completed in school?: 10th grade  Exercise Current Exercise Habits: The patient does not participate in regular exercise at present, Exercise limited by: None identified  Diet Patient reports consuming 2 meals a day and 2 snack(s) a day Patient reports that his primary diet is: Regular Patient reports that she does have regular access to food.   Depression Screen PHQ 2/9 Scores 01/04/2020 08/23/2019 07/04/2019 07/04/2019 04/01/2019 04/01/2019 12/24/2018  PHQ - 2 Score 0 0 6 0 2 0 0  PHQ- 9 Score - - 19 - 8 - -     Fall Risk Fall Risk  01/04/2020 08/23/2019 07/04/2019 04/01/2019 12/24/2018  Falls in the past year? 0 0 0 0 0  Number falls in past yr: - - - - -  Injury with Fall? - - - - -  Comment - - - - -  Follow up Falls evaluation completed - - - -     Objective:  Ronald [name] seemed alert and oriented and he participated appropriately during our telephone visit.  Blood Pressure Weight BMI  BP Readings from Last 3 Encounters:  12/15/19 (!) 177/80  12/08/19 (!) 146/80  08/23/19 (!) 148/81   Wt Readings from Last 3 Encounters:  12/15/19 183 lb 6.4 oz (83.2 kg)  12/08/19 184 lb (83.5 kg)  08/23/19 189 lb (85.7 kg)   BMI Readings from Last 1 Encounters:  12/15/19 27.08 kg/m    *Unable to obtain current vital signs, weight, and BMI due to telephone visit type  Hearing/Vision  [place] did not seem to have difficulty with hearing/understanding during the telephone conversation Reports that he has not had a formal eye exam by an eye care professional within the past year Reports that he has not had a formal hearing evaluation within the past year *  Unable to fully assess hearing and vision during telephone visit type  Cognitive Function: 6CIT Screen 01/04/2020 12/24/2018  What Year? 0 points 4 points  What month? 0 points 0 points  What time? 0 points 0 points  Count back from 20 0 points 0  points  Months in reverse 2 points 2 points  Repeat phrase 2 points 10 points  Total Score 4 16   (Normal:0-7, Significant for Dysfunction: >8)  Normal Cognitive Function Screening: Yes   Immunization & Health Maintenance Record Immunization History  Administered Date(s) Administered   Fluad Quad(high Dose 65+) 04/01/2019   Influenza Split 03/09/2014   Influenza-Unspecified 08/20/2016   Pneumococcal Conjugate-13 11/19/2017   Pneumococcal Polysaccharide-23 08/20/2016   Tdap 10/06/1996, 06/13/2001    Health Maintenance  Topic Date Due   OPHTHALMOLOGY EXAM  Never done   COVID-19 Vaccine (1) Never done   COLON CANCER SCREENING 5 YEAR SIGMOIDOSCOPY  Never done   COLONOSCOPY  08/22/2020 (Originally 02/12/1998)   TETANUS/TDAP  08/22/2020 (Originally 06/14/2011)   INFLUENZA VACCINE  01/08/2020   FOOT EXAM  03/31/2020   HEMOGLOBIN A1C  06/16/2020   Hepatitis C Screening  Completed   PNA vac Low Risk Adult  Completed       Assessment  This is a routine wellness examination for Ronald [name].  Health Maintenance: Due or Overdue Health Maintenance Due  Topic Date Due   OPHTHALMOLOGY EXAM  Never done   COVID-19 Vaccine (1) Never done   COLON CANCER SCREENING 5 YEAR SIGMOIDOSCOPY  Never done    Ronald [name] does not need a referral for Community Assistance: Care Management:   no Social Work:    no Prescription Assistance:  no Nutrition/Diabetes Education:  no   Plan:  Personalized Goals Goals Addressed             This Visit's Progress    Patient Stated       01/04/2020 AWV Goal: Exercise for General Health  Patient will verbalize understanding of the benefits of increased physical activity: Exercising regularly is important. It will improve your overall fitness, flexibility, and endurance. Regular exercise also will improve your overall health. It can help you control your weight, reduce stress, and improve your bone density. Over the next year, patient will  increase physical activity as tolerated with a goal of at least 150 minutes of moderate physical activity per week.  You can tell that you are exercising at a moderate intensity if your heart starts beating faster and you start breathing faster but can still hold a conversation. Moderate-intensity exercise ideas include: Walking 1 mile (1.6 km) in about 15 minutes Biking Hiking Golfing Dancing Water aerobics Patient will verbalize understanding of everyday activities that increase physical activity by providing examples like the following: Yard work, such as: Sales promotion account executive Gardening Washing windows or floors Patient will be able to explain general safety guidelines for exercising:  Before you start a new exercise program, talk with your health care provider. Do not exercise so much that you hurt yourself, feel dizzy, or get very short of breath. Wear comfortable clothes and wear shoes with good support. Drink plenty of water while you exercise to prevent dehydration or heat stroke. Work out until your breathing and your heartbeat get faster.        Personalized Health Maintenance & Screening Recommendations  Pneumococcal vaccine  Td vaccine Colorectal cancer screening  Lung Cancer  Screening Recommended: yes (Low Dose CT Chest recommended if Age 35-80 years, 30 pack-year currently smoking OR have quit w/in past 15 years) Hepatitis C Screening recommended: no HIV Screening recommended: no  Advanced Directives: Written information was not prepared per patient's request.  Referrals & Orders No orders of the defined types were placed in this encounter.   Follow-up Plan Follow-up with [name], FNP as planned   I have personally reviewed and noted the following in the patient's chart:   Medical and social history Use of alcohol, tobacco or illicit drugs  Current medications and  supplements Functional ability and status Nutritional status Physical activity Advanced directives List of other physicians Hospitalizations, surgeries, and ER visits in previous 12 months Vitals Screenings to include cognitive, depression, and falls Referrals and appointments  In addition, I have reviewed and discussed with Ronald [name] certain preventive protocols, quality metrics, and best practice recommendations. A written personalized care plan for preventive services as well as general preventive health recommendations is available and can be mailed to the patient at his request.      Felicity Coyer, LPN    12/28/5748   After visit summary was not mailed per patient's request.  I have reviewed and agree with the above AWV documentation.   [name], FNP

## 2020-01-20 ENCOUNTER — Telehealth: Payer: Self-pay | Admitting: Family

## 2020-01-20 DIAGNOSIS — R1310 Dysphagia, unspecified: Secondary | ICD-10-CM

## 2020-01-23 NOTE — Telephone Encounter (Signed)
Referral placed to GI 

## 2020-01-26 ENCOUNTER — Encounter (INDEPENDENT_AMBULATORY_CARE_PROVIDER_SITE_OTHER): Payer: Self-pay | Admitting: Gastroenterology

## 2020-01-28 ENCOUNTER — Other Ambulatory Visit: Payer: Self-pay | Admitting: Family

## 2020-01-28 DIAGNOSIS — J439 Emphysema, unspecified: Secondary | ICD-10-CM

## 2020-02-03 ENCOUNTER — Telehealth: Payer: Self-pay | Admitting: Family

## 2020-02-03 ENCOUNTER — Other Ambulatory Visit: Payer: Self-pay | Admitting: Family

## 2020-02-03 DIAGNOSIS — J439 Emphysema, unspecified: Secondary | ICD-10-CM

## 2020-02-03 NOTE — Telephone Encounter (Signed)
  Prescription Request  02/03/2020  What is the name of the medication or equipment? Pt is almost out of Xanax. Christy changed his rx from 3 a day to 2 a day. [name] called in and said his anxiety is so bad and he has panic attacks he needs to take it three times a day.  Have you contacted your pharmacy to request a refill? (if applicable) no  Which pharmacy would you [facility] this sent to? [facility]   Patient notified that their request is being sent to the clinical staff for review and that they should receive a response within 2 business days.

## 2020-02-04 DIAGNOSIS — J439 Emphysema, unspecified: Secondary | ICD-10-CM | POA: Diagnosis not present

## 2020-02-06 MED ORDER — BUSPIRONE HCL 7.5 MG PO TABS: 7.5000 mg | ORAL_TABLET | Freq: Three times a day (TID) | ORAL | 2 refills | Status: DC

## 2020-02-06 NOTE — Telephone Encounter (Signed)
Buspar 7.5 mg TID prn added. He can take this. He needs an appointment to refill xanax. With his breathing we can increase back to TID.

## 2020-02-06 NOTE — Telephone Encounter (Signed)
Aware Buspar 7.5mg  TID was added and aware he will need to be seen for a refill on Xanax.  Ronald [name] will call office back at a later time to schedule an appt.

## 2020-02-27 ENCOUNTER — Encounter (INDEPENDENT_AMBULATORY_CARE_PROVIDER_SITE_OTHER): Payer: Self-pay | Admitting: *Deleted

## 2020-02-27 ENCOUNTER — Encounter (INDEPENDENT_AMBULATORY_CARE_PROVIDER_SITE_OTHER): Payer: Self-pay | Admitting: Gastroenterology

## 2020-02-27 ENCOUNTER — Ambulatory Visit (INDEPENDENT_AMBULATORY_CARE_PROVIDER_SITE_OTHER): Payer: Medicare HMO | Admitting: Gastroenterology

## 2020-02-27 ENCOUNTER — Other Ambulatory Visit: Payer: Self-pay

## 2020-02-27 DIAGNOSIS — R197 Diarrhea, unspecified: Secondary | ICD-10-CM | POA: Diagnosis not present

## 2020-02-27 DIAGNOSIS — R1310 Dysphagia, unspecified: Secondary | ICD-10-CM | POA: Diagnosis not present
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
## 2020-02-27 NOTE — Patient Instructions (Signed)
Schedule modified barium swallow Schedule barium esophagram with pill Obtain clearance by pulmonologist (Dr. Melvyn Novas) to perform EGD and colonoscopy

## 2020-02-28 ENCOUNTER — Telehealth: Payer: Self-pay | Admitting: Family

## 2020-02-28 NOTE — Telephone Encounter (Signed)
Pt came into the office for a refill on ALPRAZolam (XANAX) 0.5 MG tablet. I told him Alyse Low would not send in a refill without an apt. He insisted on taking to her in the office and I told him she was seeing patients and that the nurse or Alyse Low would call back. I scheduled an apt for 03/06/2020 and he said he had other doctors to see first before seeing Christy. I did not cancel in case patient needs to keep for this med refill. Please cancel if patient does not want. Please call back

## 2020-02-28 NOTE — Telephone Encounter (Signed)
Pt called - Left detailed message about office policy with refills and controlled meds - appt time also reviewed.

## 2020-02-29 ENCOUNTER — Other Ambulatory Visit (HOSPITAL_COMMUNITY): Payer: Self-pay | Admitting: Specialist

## 2020-02-29 DIAGNOSIS — R1319 Other dysphagia: Secondary | ICD-10-CM

## 2020-02-29 DIAGNOSIS — K219 Gastro-esophageal reflux disease without esophagitis: Secondary | ICD-10-CM

## 2020-03-05 ENCOUNTER — Ambulatory Visit (HOSPITAL_COMMUNITY): Admission: RE | Admit: 2020-03-05 | Payer: Medicare HMO | Source: Ambulatory Visit

## 2020-03-06 ENCOUNTER — Other Ambulatory Visit: Payer: Self-pay

## 2020-03-06 ENCOUNTER — Encounter: Payer: Self-pay | Admitting: Family

## 2020-03-06 ENCOUNTER — Ambulatory Visit (INDEPENDENT_AMBULATORY_CARE_PROVIDER_SITE_OTHER): Payer: Medicare HMO | Admitting: Family

## 2020-03-06 VITALS — BP 151/72 | HR 69 | Temp 97.6°F | Ht 69.0 in | Wt 177.6 lb

## 2020-03-06 DIAGNOSIS — I10 Essential (primary) hypertension: Secondary | ICD-10-CM | POA: Diagnosis not present

## 2020-03-06 DIAGNOSIS — F419 Anxiety disorder, unspecified: Secondary | ICD-10-CM

## 2020-03-06 DIAGNOSIS — I2510 Atherosclerotic heart disease of native coronary artery without angina pectoris: Secondary | ICD-10-CM | POA: Diagnosis not present

## 2020-03-06 DIAGNOSIS — E1159 Type 2 diabetes mellitus with other circulatory complications: Secondary | ICD-10-CM | POA: Diagnosis not present

## 2020-03-06 DIAGNOSIS — Z720 Tobacco use: Secondary | ICD-10-CM

## 2020-03-06 DIAGNOSIS — I509 Heart failure, unspecified: Secondary | ICD-10-CM | POA: Diagnosis not present

## 2020-03-06 DIAGNOSIS — F1320 Sedative, hypnotic or anxiolytic dependence, uncomplicated: Secondary | ICD-10-CM | POA: Diagnosis not present

## 2020-03-06 DIAGNOSIS — Z91199 Patient's noncompliance with other medical treatment and regimen due to unspecified reason: Secondary | ICD-10-CM

## 2020-03-06 DIAGNOSIS — Z9119 Patient's noncompliance with other medical treatment and regimen: Secondary | ICD-10-CM

## 2020-03-06 DIAGNOSIS — R69 Illness, unspecified: Secondary | ICD-10-CM | POA: Diagnosis not present

## 2020-03-06 DIAGNOSIS — K219 Gastro-esophageal reflux disease without esophagitis: Secondary | ICD-10-CM | POA: Diagnosis not present

## 2020-03-06 DIAGNOSIS — I700 Atherosclerosis of aorta: Secondary | ICD-10-CM | POA: Diagnosis not present

## 2020-03-06 DIAGNOSIS — E1142 Type 2 diabetes mellitus with diabetic polyneuropathy: Secondary | ICD-10-CM | POA: Diagnosis not present

## 2020-03-06 DIAGNOSIS — F17210 Nicotine dependence, cigarettes, uncomplicated: Secondary | ICD-10-CM

## 2020-03-06 DIAGNOSIS — J439 Emphysema, unspecified: Secondary | ICD-10-CM

## 2020-03-06 DIAGNOSIS — Z7189 Other specified counseling: Secondary | ICD-10-CM

## 2020-03-06 DIAGNOSIS — I152 Hypertension secondary to endocrine disorders: Secondary | ICD-10-CM

## 2020-03-06 DIAGNOSIS — F331 Major depressive disorder, recurrent, moderate: Secondary | ICD-10-CM

## 2020-03-06 HISTORY — DX: Other specified counseling: Z71.89

## 2020-03-06 MED ORDER — TRELEGY ELLIPTA 100-62.5-25 MCG/INH IN AEPB: INHALATION_SPRAY | RESPIRATORY_TRACT | 11 refills | Status: DC

## 2020-03-06 MED ORDER — ALPRAZOLAM 0.5 MG PO TABS: 0.5000 mg | ORAL_TABLET | Freq: Two times a day (BID) | ORAL | 2 refills | Status: DC | PRN

## 2020-03-06 MED ORDER — IPRATROPIUM-ALBUTEROL 0.5-2.5 (3) MG/3ML IN SOLN: RESPIRATORY_TRACT | 3 refills | Status: DC

## 2020-03-06 NOTE — Progress Notes (Signed)
Subjective:    Patient ID: Ronald [name], male    DOB: 29-Dec-1945, 74 y.o.   MRN: [id number]  Chief Complaint  Patient presents with  . Medical Management of Chronic Issues  . COPD   PT presents to the office today for chronic follow up. PT is followed by Cardiologists. Pt's BP is elevated today. He is noncompliant with his medications and appointments. He can not read and this does make his medication adherence difficult at times. His ex-wife does his medications for him. He is followed by ENT for hx of trach. He is followed by Pulmonologist for COPD.  COPD He complains of cough, shortness of breath and wheezing. This is a chronic problem. The current episode started more than 1 year ago. The problem occurs intermittently. Associated symptoms include chest pain, heartburn, malaise/fatigue and orthopnea. His past medical history is significant for COPD.  Hypertension This is a chronic problem. The current episode started more than 1 year ago. The problem has been waxing and waning since onset. The problem is uncontrolled. Associated symptoms include anxiety, blurred vision, chest pain, malaise/fatigue, peripheral edema and shortness of breath. Pertinent negatives include no palpitations. Risk factors for coronary artery disease include dyslipidemia, diabetes mellitus, male gender, sedentary lifestyle, smoking/tobacco exposure and obesity. The current treatment provides moderate improvement. Hypertensive end-organ damage includes kidney disease, CAD/MI and heart failure.  Congestive Heart Failure Presents for follow-up visit. Associated symptoms include chest pain, edema, fatigue, orthopnea and shortness of breath. Pertinent negatives include no palpitations. The symptoms have been stable.  Gastroesophageal Reflux He complains of belching, chest pain, coughing, heartburn and wheezing. This is a chronic problem. The current episode started more than 1 year ago. The problem occurs occasionally. The  problem has been waxing and waning. Associated symptoms include fatigue and orthopnea. He has tried a PPI for the symptoms. The treatment provided moderate relief.  Diabetes He presents for his follow-up diabetic visit. He has type 2 diabetes mellitus. His disease course has been stable. Associated symptoms include blurred vision, chest pain, fatigue and foot paresthesias. There are no hypoglycemic complications. Symptoms are stable. Diabetic complications include heart disease, nephropathy and peripheral neuropathy. Risk factors for coronary artery disease include diabetes mellitus, dyslipidemia, obesity, hypertension, male sex and sedentary lifestyle. He is following a generally unhealthy diet. (Does not check BS at home ) An ACE inhibitor/angiotensin II receptor blocker is being taken. Eye exam is not current.  Nicotine Dependence Presents for follow-up visit. Symptoms include fatigue, insomnia and irritability. His urge triggers include company of smokers. He smokes 1 pack of cigarettes per day.  Depression        This is a chronic problem.  The current episode started more than 1 year ago.   The onset quality is gradual.   The problem occurs intermittently.  Associated symptoms include fatigue, helplessness, hopelessness, insomnia, irritable, restlessness, decreased interest and sad.  Past treatments include SSRIs - Selective serotonin reuptake inhibitors.  Compliance with treatment is good.  Past medical history includes anxiety.   Anxiety Presents for follow-up visit. Symptoms include chest pain, excessive worry, insomnia, irritability, restlessness and shortness of breath. Patient reports no palpitations. Symptoms occur occasionally. The quality of sleep is good.        Review of Systems  Constitutional: Positive for fatigue, irritability and malaise/fatigue.  Eyes: Positive for blurred vision.  Respiratory: Positive for cough, shortness of breath and wheezing.   Cardiovascular: Positive  for chest pain. Negative for palpitations.  Gastrointestinal: Positive for  heartburn.  Psychiatric/Behavioral: Positive for depression. The patient has insomnia.   All other systems reviewed and are negative.      Objective:   Physical Exam Vitals reviewed.  Constitutional:      General: He is irritable. He is not in acute distress.    Appearance: He is well-developed.  HENT:     Head: Normocephalic.     Right Ear: Tympanic membrane normal.     Left Ear: Tympanic membrane normal.  Eyes:     General:        Right eye: No discharge.        Left eye: No discharge.     Pupils: Pupils are equal, round, and reactive to light.  Neck:     Thyroid: No thyromegaly.  Cardiovascular:     Rate and Rhythm: Normal rate and regular rhythm.     Heart sounds: Normal heart sounds. No murmur heard.   Pulmonary:     Effort: Pulmonary effort is normal. No respiratory distress.     Breath sounds: Normal breath sounds. Decreased air movement present. No wheezing.  Abdominal:     General: Bowel sounds are normal. There is no distension.     Palpations: Abdomen is soft.     Tenderness: There is no abdominal tenderness.  Musculoskeletal:        General: No tenderness. Normal range of motion.     Cervical back: Normal range of motion and neck supple.     Comments: Bilateral edema in arms  Skin:    General: Skin is warm and dry.     Findings: No erythema or rash.  Neurological:     Mental Status: He is alert and oriented to person, place, and time.     Cranial Nerves: No cranial nerve deficit.     Deep Tendon Reflexes: Reflexes are normal and symmetric.  Psychiatric:        Behavior: Behavior normal.        Thought Content: Thought content normal.        Judgment: Judgment normal.          BP (!) 151/72   Pulse 69   Temp 97.6 F (36.4 C) (Temporal)   Ht 5' 9"  (1.753 m)   Wt 177 lb 9.6 oz (80.6 kg)   SpO2 92%   BMI 26.23 kg/m   Assessment & Plan:  Ronald [name] comes in today  with chief complaint of Medical Management of Chronic Issues and COPD   Diagnosis and orders addressed:  1. Anxiety - ALPRAZolam (XANAX) 0.5 MG tablet; Take 1 tablet (0.5 mg total) by mouth 2 (two) times daily as needed. for anxiety  Dispense: 60 tablet; Refill: 2 - BMP8+EGFR  2. Benzodiazepine dependence (HCC) - ALPRAZolam (XANAX) 0.5 MG tablet; Take 1 tablet (0.5 mg total) by mouth 2 (two) times daily as needed. for anxiety  Dispense: 60 tablet; Refill: 2 - BMP8+EGFR  3. Pulmonary emphysema, unspecified emphysema type ([place]) - ipratropium-albuterol (DUONEB) 0.5-2.5 (3) MG/3ML SOLN; USE ONE VIAL BY NEBULIZER FOUR TIMES DAILY  Dispense: 360 mL; Refill: 3 - BMP8+EGFR  4. Aortic atherosclerosis (HCC) - BMP8+EGFR  5. CAD in native artery - BMP8+EGFR  6. Congestive heart failure, unspecified HF chronicity, unspecified heart failure type (HCC) - BMP8+EGFR  7. Hypertension associated with diabetes ([place]) - BMP8+EGFR  8. Gastroesophageal reflux disease, unspecified whether esophagitis present - BMP8+EGFR  9. Diabetic polyneuropathy associated with type 2 diabetes mellitus (HCC) - BMP8+EGFR  10. Type 2 diabetes mellitus  with diabetic polyneuropathy, without long-term current use of insulin (HCC) - BMP8+EGFR  11. Cigarette smoker - BMP8+EGFR  12. Moderate episode of recurrent major depressive disorder (HCC) - BMP8+EGFR  13. Noncompliance - BMP8+EGFR  14. Tobacco abuse - BMP8+EGFR  15. Educated about COVID-19 virus infection PT refuses COVID vaccine. Long discussion this could result in death. He is aware.   Labs pending Health Maintenance reviewed Diet and exercise encouraged  Follow up plan: 3 months    [name], FNP

## 2020-03-06 NOTE — Patient Instructions (Signed)

## 2020-03-07 ENCOUNTER — Other Ambulatory Visit: Payer: Self-pay | Admitting: Family

## 2020-03-07 DIAGNOSIS — E1159 Type 2 diabetes mellitus with other circulatory complications: Secondary | ICD-10-CM

## 2020-03-07 DIAGNOSIS — I152 Hypertension secondary to endocrine disorders: Secondary | ICD-10-CM

## 2020-03-07 DIAGNOSIS — E785 Hyperlipidemia, unspecified: Secondary | ICD-10-CM

## 2020-03-07 LAB — BMP8+EGFR
BUN/Creatinine Ratio: 13 (ref 10–24)
BUN: 12 mg/dL (ref 8–27)
CO2: 31 mmol/L — ABNORMAL HIGH (ref 20–29)
Calcium: 8.7 mg/dL (ref 8.6–10.2)
Chloride: 94 mmol/L — ABNORMAL LOW (ref 96–106)
Creatinine, Ser: 0.89 mg/dL (ref 0.76–1.27)
GFR calc Af Amer: 99 mL/min/{1.73_m2} (ref >59)
GFR calc non Af Amer: 85 mL/min/{1.73_m2} (ref >59)
Glucose: 148 mg/dL — ABNORMAL HIGH (ref 65–99)
Potassium: 3 mmol/L — ABNORMAL LOW (ref 3.5–5.2)
Sodium: 141 mmol/L (ref 134–144)

## 2020-03-08 ENCOUNTER — Encounter (HOSPITAL_COMMUNITY): Payer: Self-pay | Admitting: Speech Pathology

## 2020-03-08 ENCOUNTER — Ambulatory Visit (HOSPITAL_COMMUNITY): Payer: Medicare HMO | Attending: Gastroenterology | Admitting: Speech Pathology

## 2020-03-08 ENCOUNTER — Other Ambulatory Visit: Payer: Self-pay | Admitting: Family

## 2020-03-08 ENCOUNTER — Telehealth: Payer: Self-pay | Admitting: Family

## 2020-03-08 ENCOUNTER — Other Ambulatory Visit: Payer: Self-pay

## 2020-03-08 ENCOUNTER — Ambulatory Visit (HOSPITAL_COMMUNITY)
Admission: RE | Admit: 2020-03-08 | Discharge: 2020-03-08 | Disposition: A | Discharge status: Home / Self Care | Payer: Medicare HMO | Source: Ambulatory Visit | Attending: Gastroenterology | Admitting: Gastroenterology

## 2020-03-08 DIAGNOSIS — T17300A Unspecified foreign body in larynx causing asphyxiation, initial encounter: Secondary | ICD-10-CM | POA: Diagnosis not present

## 2020-03-08 DIAGNOSIS — F32A Depression, unspecified: Secondary | ICD-10-CM

## 2020-03-08 DIAGNOSIS — I152 Hypertension secondary to endocrine disorders: Secondary | ICD-10-CM

## 2020-03-08 DIAGNOSIS — E1159 Type 2 diabetes mellitus with other circulatory complications: Secondary | ICD-10-CM

## 2020-03-08 DIAGNOSIS — M7989 Other specified soft tissue disorders: Secondary | ICD-10-CM

## 2020-03-08 DIAGNOSIS — G4700 Insomnia, unspecified: Secondary | ICD-10-CM

## 2020-03-08 DIAGNOSIS — F1320 Sedative, hypnotic or anxiolytic dependence, uncomplicated: Secondary | ICD-10-CM

## 2020-03-08 DIAGNOSIS — R1312 Dysphagia, oropharyngeal phase: Secondary | ICD-10-CM | POA: Diagnosis not present

## 2020-03-08 DIAGNOSIS — K219 Gastro-esophageal reflux disease without esophagitis: Secondary | ICD-10-CM | POA: Diagnosis not present

## 2020-03-08 DIAGNOSIS — R1319 Other dysphagia: Secondary | ICD-10-CM | POA: Diagnosis not present

## 2020-03-08 DIAGNOSIS — R1310 Dysphagia, unspecified: Secondary | ICD-10-CM | POA: Diagnosis not present

## 2020-03-08 DIAGNOSIS — F419 Anxiety disorder, unspecified: Secondary | ICD-10-CM

## 2020-03-08 DIAGNOSIS — E785 Hyperlipidemia, unspecified: Secondary | ICD-10-CM

## 2020-03-08 DIAGNOSIS — R0602 Shortness of breath: Secondary | ICD-10-CM

## 2020-03-08 DIAGNOSIS — I509 Heart failure, unspecified: Secondary | ICD-10-CM

## 2020-03-08 MED ORDER — SODIUM CHLORIDE 0.9 % IV SOLN
INTRAVENOUS | Status: AC
  Filled 2020-03-08: qty 100

## 2020-03-08 MED ORDER — POTASSIUM CHLORIDE CRYS ER 20 MEQ PO TBCR: 20.0000 meq | EXTENDED_RELEASE_TABLET | Freq: Three times a day (TID) | ORAL | 2 refills | Status: DC

## 2020-03-08 NOTE — Telephone Encounter (Signed)
NOTED AND PHARMACY IS CORRECT IN CHART

## 2020-03-08 NOTE — Therapy (Signed)
[place], [place], [address] Phone: [phone]   Fax:  [phone]  Modified Barium Swallow  Patient Details  Name: Ronald [name] MRN: [id number] Date of Birth: [birth year] No data recorded  Encounter Date: 03/08/2020   End of Session - 03/08/20 1410    Visit Number 1    Number of Visits 1    Authorization Type [employer] Medicare    SLP Start Time 1140    SLP Stop Time  1203    SLP Time Calculation (min) 23 min    Activity Tolerance Patient tolerated treatment well           Past Medical History:  Diagnosis Date  . Anxiety   . Asthma   . Chronic lower back pain   . COPD (chronic obstructive pulmonary disease) ([place])   . COPD (chronic obstructive pulmonary disease) ([place])   . Coronary artery disease   . Depression   . GERD (gastroesophageal reflux disease)   . High cholesterol   . Hypertension   . NSTEMI (non-ST elevated myocardial infarction) ([place]) 05/2014   with stent placement  . Stroke ([place])    anyeusym   . TIA (transient ischemic attack)    "they say I've had some mini strokes; don't know when"; denies residual on 06/22/2014)  . Type II diabetes mellitus ([place])   . Ulcerative colitis [facility])     Past Surgical History:  Procedure Laterality Date  . APPENDECTOMY    . CARDIAC CATHETERIZATION  1990's X 3  . CHOLECYSTECTOMY    . CORONARY ANGIOPLASTY WITH STENT PLACEMENT  05/2014   "2"  . TUMOR EXCISION Right ~ 1999   "side of my upper head"    There were no vitals filed for this visit.   Subjective Assessment - 03/08/20 1405    Subjective "I get short of breath when I walk and I feel like my throat closes off when I eat sometimes."    Special Tests MBSS    Currently in Pain? No/denies               General - 03/08/20 1405      General Information   Date of Onset 02/08/20    HPI Ronald [name] is a 74 yo male who was referred by Dr. Montez Morita for MBSS due to Pt's reports of  dysphagia since he had a trach placed in 2018 (removed in 2018) He was followed by Dr. Ernestine Conrad for trach care and removal (subglottic stenosis, balloon dilation in the past). He is edentulous and consumes self regulated regular textures and thin liquids.    Type of Study MBS-Modified Barium Swallow Study    Diet Prior to this Study Regular;Thin liquids    Temperature Spikes Noted No    Respiratory Status Room air    History of Recent Intubation No    Behavior/Cognition Alert;Cooperative;Pleasant mood    Oral Cavity Assessment Within Functional Limits    Oral Care Completed by SLP No    Oral Cavity - Dentition Edentulous    Vision Functional for self feeding    Self-Feeding Abilities Able to feed self    Patient Positioning Upright in chair    Baseline Vocal Quality Normal    Volitional Cough Strong    Volitional Swallow Able to elicit    Anatomy Within functional limits    Pharyngeal Secretions Not observed secondary MBS  Oral Preparation/Oral Phase - 03/08/20 1407      Oral Preparation/Oral Phase   Oral Phase Impaired      Oral - Thin   Oral - Thin Teaspoon Within functional limits    Oral - Thin Cup Within functional limits    Oral - Thin Straw Within functional limits      Oral - Solids   Oral - Puree Piecemeal swallowing;Oral residue    Oral - Regular Piecemeal swallowing;Oral residue    Oral - Pill Within functional limits      Electrical stimulation - Oral Phase   Was Electrical Stimulation Used No            Pharyngeal Phase - 03/08/20 1408      Pharyngeal Phase   Pharyngeal Phase Impaired      Pharyngeal - Thin   Pharyngeal- Thin Teaspoon Within functional limits;Swallow initiation at vallecula;Penetration/Aspiration during swallow    Pharyngeal Material does not enter airway;Material enters airway, remains ABOVE vocal cords then ejected out    Pharyngeal- Thin Cup Within functional limits;Swallow initiation at  vallecula;Penetration/Aspiration during swallow    Pharyngeal Material does not enter airway;Material enters airway, remains ABOVE vocal cords then ejected out    Pharyngeal- Thin Straw Within functional limits;Swallow initiation at vallecula;Penetration/Aspiration during swallow    Pharyngeal Material does not enter airway;Material enters airway, remains ABOVE vocal cords then ejected out      Pharyngeal - Solids   Pharyngeal- Puree Swallow initiation at vallecula;Within functional limits    Pharyngeal- Regular Within functional limits    Pharyngeal- Pill Swallow initiation at vallecula;Pharyngeal residue - valleculae      Electrical Stimulation - Pharyngeal Phase   Was Electrical Stimulation Used No            Cricopharyngeal Phase - 03/08/20 1409      Cervical Esophageal Phase   Cervical Esophageal Phase Impaired               Plan - 03/08/20 1411    Clinical Impression Statement Pt presents with min oral phase dysphagia in setting of edentulous status characterized by prolonged oral phase with piecemeal deglutition and reduced bolus cohesion. Pharyngeal phase is WNL with swallow trigger generally at the level of the valleculae and variable flash penetration of thins during the swallow (immediately expelled), no aspiration or significant residuals pharyngeally post swallow. Pt swallowed a barium tablet with cup sip of thin, which became transiently delayed in the valleculae before then becoming transiently delayed near the UES, which eventually passed with liquid wash and became delayed in the distal esophagus (cleared with puree wash). Recommend self regulated regular textures and thin liquids with standard aspiration precautions. Pt's compromised respiratory status, increases his risk for aspiration (respirationo/swallow reciprocity in an individual with COPD). He will likely need to follow medications with liquid/food to ensure clearance. Consider ENT follow up to examine  laryngeal complex if Pt continues to be bothered my symptoms. It appears he is scheduled for EGD next week. No further SLP services indicated at this time.    Consulted and Agree with Plan of Care Patient           Patient will benefit from skilled therapeutic intervention in order to improve the following deficits and impairments:   Dysphagia, oropharyngeal phase     Recommendations/Treatment - 03/08/20 1410      Swallow Evaluation Recommendations   Recommended Consults Consider ENT evaluation    SLP Diet Recommendations Age appropriate regular;Thin    Liquid Administration  via Cup;Straw    Medication Administration Whole meds with liquid    Supervision Patient able to self feed    Postural Changes Seated upright at 90 degrees;Remain upright for at least 30 minutes after feeds/meals            Prognosis - 03/08/20 1410      Prognosis   Prognosis for Safe Diet Advancement Good      Individuals Consulted   Consulted and Agree with Results and Recommendations Patient    Report Sent to  Referring physician           Problem List Patient Active Problem List   Diagnosis Date Noted  . Educated about COVID-19 virus infection 03/06/2020  . Dysphagia 02/27/2020  . Diarrhea 02/27/2020  . Tracheal stenosis 12/09/2019  . Noncompliance 04/01/2019  . Lymphedema 04/01/2019  . CHF (congestive heart failure) ([place]) 08/27/2018  . Aortic atherosclerosis ([place]) 05/11/2017  . Depression 05/05/2017  . GERD (gastroesophageal reflux disease) 01/16/2017  . Diabetic neuropathy ([place]) 01/16/2017  . Anxiety 02/14/2016  . Chronic respiratory failure with hypoxia ([place]) 07/19/2014  . Hypertension associated with diabetes ([place]) 07/19/2014  . Cigarette smoker 07/19/2014  . Chest pain at rest 06/22/2014  . CAD in native artery 06/22/2014  . Diabetes mellitus ([place]) 06/22/2014  . COPD (chronic obstructive pulmonary disease) with emphysema ([place]) 06/22/2014  . Tobacco abuse 06/22/2014  . Acute  encephalopathy 06/22/2014  . Leucocytosis 06/22/2014   Thank you,  Genene Churn, [place]  Genene Churn 03/08/2020, 2:12 PM  [place], [place], [address] Phone: [phone]   Fax:  [phone]  Name: SHIVAM MESTAS MRN: [id number] Date of Birth: 12-17-1945

## 2020-03-08 NOTE — Telephone Encounter (Signed)
Pts friend called to let Ronald [name] know that pt does not use [facility] in [place] anymore and needs all of his Rx's sent to [facility].

## 2020-03-09 ENCOUNTER — Other Ambulatory Visit: Payer: Self-pay | Admitting: *Deleted

## 2020-03-09 DIAGNOSIS — E876 Hypokalemia: Secondary | ICD-10-CM

## 2020-03-09 MED ORDER — MIRTAZAPINE 15 MG PO TABS: 15.0000 mg | ORAL_TABLET | Freq: Every day | ORAL | 0 refills | Status: DC

## 2020-03-09 MED ORDER — ATORVASTATIN CALCIUM 40 MG PO TABS: ORAL_TABLET | ORAL | 1 refills | Status: DC

## 2020-03-09 MED ORDER — METOPROLOL TARTRATE 50 MG PO TABS: 50.0000 mg | ORAL_TABLET | Freq: Two times a day (BID) | ORAL | 1 refills | Status: DC

## 2020-03-09 MED ORDER — AMLODIPINE BESYLATE 5 MG PO TABS: 5.0000 mg | ORAL_TABLET | Freq: Every day | ORAL | 1 refills | Status: DC

## 2020-03-09 MED ORDER — FUROSEMIDE 20 MG PO TABS: 20.0000 mg | ORAL_TABLET | Freq: Two times a day (BID) | ORAL | 5 refills | Status: DC

## 2020-03-09 MED ORDER — BUSPIRONE HCL 7.5 MG PO TABS: 7.5000 mg | ORAL_TABLET | Freq: Three times a day (TID) | ORAL | 2 refills | Status: DC

## 2020-03-09 MED ORDER — LOSARTAN POTASSIUM 100 MG PO TABS: 100.0000 mg | ORAL_TABLET | Freq: Every day | ORAL | 3 refills | Status: DC

## 2020-03-09 MED ORDER — SITAGLIPTIN PHOSPHATE 50 MG PO TABS: 50.0000 mg | ORAL_TABLET | Freq: Every day | ORAL | 2 refills | Status: DC

## 2020-03-09 MED ORDER — ALPRAZOLAM 0.5 MG PO TABS: 0.5000 mg | ORAL_TABLET | Freq: Two times a day (BID) | ORAL | 2 refills | Status: DC | PRN

## 2020-03-09 MED ORDER — ESCITALOPRAM OXALATE 20 MG PO TABS: 20.0000 mg | ORAL_TABLET | Freq: Every day | ORAL | 1 refills | Status: DC

## 2020-03-09 MED ORDER — TRELEGY ELLIPTA 100-62.5-25 MCG/INH IN AEPB: 1.0000 | INHALATION_SPRAY | Freq: Every day | RESPIRATORY_TRACT | 0 refills | Status: DC

## 2020-03-09 MED ORDER — PANTOPRAZOLE SODIUM 40 MG PO TBEC: 40.0000 mg | DELAYED_RELEASE_TABLET | Freq: Every day | ORAL | 1 refills | Status: DC

## 2020-03-09 MED ORDER — POTASSIUM CHLORIDE CRYS ER 20 MEQ PO TBCR: 20.0000 meq | EXTENDED_RELEASE_TABLET | Freq: Three times a day (TID) | ORAL | 2 refills | Status: DC

## 2020-03-09 NOTE — Telephone Encounter (Signed)
Refilled medications. For the last three months, patient has only received #60 a month.

## 2020-03-12 ENCOUNTER — Ambulatory Visit (HOSPITAL_COMMUNITY)
Admission: RE | Admit: 2020-03-12 | Discharge: 2020-03-12 | Disposition: A | Discharge status: Home / Self Care | Payer: Medicare HMO | Source: Ambulatory Visit | Attending: Gastroenterology | Admitting: Gastroenterology

## 2020-03-12 ENCOUNTER — Telehealth (INDEPENDENT_AMBULATORY_CARE_PROVIDER_SITE_OTHER): Payer: Self-pay | Admitting: Gastroenterology

## 2020-03-12 ENCOUNTER — Other Ambulatory Visit: Payer: Self-pay

## 2020-03-12 DIAGNOSIS — R1310 Dysphagia, unspecified: Secondary | ICD-10-CM | POA: Diagnosis not present

## 2020-03-12 DIAGNOSIS — K222 Esophageal obstruction: Secondary | ICD-10-CM | POA: Diagnosis not present

## 2020-03-12 NOTE — Telephone Encounter (Signed)
I spoke to the patient and his wife regarding the results of his most recent barium esophagram that shows presence of possible stricture at the GE junction.  Due to this we will have to proceed with previously discussed EGD, we will also perform colonoscopy for surveillance of his ulcerative colitis.  Both the patient and wife understood and agreed.  For this will need to obtain clearance from the pulmonary standpoint.  The wife reported that he is due to have follow-up appointment with Dr. Melvyn Novas next week.  Once we get the clearance we will schedule the procedures.  Ronald Peppers, MD Gastroenterology and Hepatology [facility]

## 2020-03-13 NOTE — Progress Notes (Signed)
Thanks

## 2020-03-16 ENCOUNTER — Ambulatory Visit: Payer: Medicare HMO | Admitting: Family

## 2020-03-27 ENCOUNTER — Telehealth: Payer: Self-pay

## 2020-03-27 NOTE — Telephone Encounter (Signed)
Patient ex wife called aware patient is suppose to only take 2 a day

## 2020-03-29 ENCOUNTER — Telehealth: Payer: Self-pay

## 2020-03-29 MED ORDER — TRELEGY ELLIPTA 100-62.5-25 MCG/INH IN AEPB
INHALATION_SPRAY | RESPIRATORY_TRACT | 11 refills | Status: DC
Start: 2020-03-29 — End: 2020-04-19

## 2020-03-29 NOTE — Telephone Encounter (Signed)
Patient aware and verbalized understanding. °

## 2020-03-29 NOTE — Telephone Encounter (Signed)
Printed prescription for Trelegy for the patient

## 2020-03-29 NOTE — Telephone Encounter (Signed)
Requesting a printed script of trelegy.  Patient is getting assistance with it since it is so high and the company is requiring a printed script. Please advise - covering pcp

## 2020-03-30 ENCOUNTER — Other Ambulatory Visit (HOSPITAL_COMMUNITY)
Admission: RE | Admit: 2020-03-30 | Discharge: 2020-03-30 | Disposition: A | Discharge status: Home / Self Care | Payer: Medicare HMO | Source: Ambulatory Visit | Attending: Internal Medicine | Admitting: Internal Medicine

## 2020-03-30 ENCOUNTER — Other Ambulatory Visit: Payer: Self-pay

## 2020-03-30 DIAGNOSIS — Z01812 Encounter for preprocedural laboratory examination: Secondary | ICD-10-CM | POA: Insufficient documentation

## 2020-03-30 DIAGNOSIS — Z20822 Contact with and (suspected) exposure to covid-19: Secondary | ICD-10-CM | POA: Insufficient documentation

## 2020-03-31 LAB — SARS CORONAVIRUS 2 (TAT 6-24 HRS): SARS Coronavirus 2: NEGATIVE

## 2020-04-01 ENCOUNTER — Other Ambulatory Visit: Payer: Self-pay | Admitting: Family

## 2020-04-01 DIAGNOSIS — J439 Emphysema, unspecified: Secondary | ICD-10-CM

## 2020-04-03 ENCOUNTER — Ambulatory Visit (INDEPENDENT_AMBULATORY_CARE_PROVIDER_SITE_OTHER): Payer: Medicare HMO | Admitting: Family

## 2020-04-03 ENCOUNTER — Encounter: Payer: Self-pay | Admitting: Family

## 2020-04-03 DIAGNOSIS — F419 Anxiety disorder, unspecified: Secondary | ICD-10-CM

## 2020-04-03 DIAGNOSIS — Z79899 Other long term (current) drug therapy: Secondary | ICD-10-CM | POA: Diagnosis not present

## 2020-04-03 DIAGNOSIS — R69 Illness, unspecified: Secondary | ICD-10-CM | POA: Diagnosis not present

## 2020-04-03 DIAGNOSIS — F1320 Sedative, hypnotic or anxiolytic dependence, uncomplicated: Secondary | ICD-10-CM | POA: Diagnosis not present

## 2020-04-03 MED ORDER — HYDROXYZINE PAMOATE 25 MG PO CAPS: 25.0000 mg | ORAL_CAPSULE | Freq: Three times a day (TID) | ORAL | 0 refills | Status: DC | PRN

## 2020-04-03 MED ORDER — ALPRAZOLAM 0.5 MG PO TABS: 0.5000 mg | ORAL_TABLET | Freq: Two times a day (BID) | ORAL | 2 refills | Status: DC | PRN

## 2020-04-03 NOTE — Progress Notes (Signed)
Virtual Visit via telephone Note Due to COVID-19 pandemic this visit was conducted virtually. This visit type was conducted due to national recommendations for restrictions regarding the COVID-19 Pandemic (e.g. social distancing, sheltering in place) in an effort to limit this patient's exposure and mitigate transmission in our community. All issues noted in this document were discussed and addressed.  A physical exam was not performed with this format.  I connected with Ronald [name] on 04/03/20 at 2:51 pm by telephone and verified that I am speaking with the correct person using two identifiers. Ronald [name] is currently located at home and his exwife is currently with him  during visit. The provider, [name], FNP is located in their office at time of visit.  I discussed the limitations, risks, security and privacy concerns of performing an evaluation and management service by telephone and the availability of in person appointments. I also discussed with the patient that there may be a patient responsible charge related to this service. The patient expressed understanding and agreed to proceed.   History and Present Illness:  Pt calls the office today with questions about his xanax. He states xanax 0.5 mg was decreased to BID and he thought it was going to be increased back to TID. He reports his anxiety is increased. He has COPD and has constantly SOB.   Anxiety Presents for follow-up visit. Symptoms include decreased concentration, depressed mood, excessive worry, irritability, nervous/anxious behavior and restlessness. Symptoms occur constantly. The severity of symptoms is moderate.       Review of Systems  Constitutional: Positive for irritability.  Psychiatric/Behavioral: Positive for decreased concentration. The patient is nervous/anxious.   All other systems reviewed and are negative.    Observations/Objective: No SOB or distress noted, hoarse voice, irritable    Assessment and Plan: 1. Anxiety - ALPRAZolam (XANAX) 0.5 MG tablet; Take 1 tablet (0.5 mg total) by mouth 2 (two) times daily as needed. for anxiety  Dispense: 60 tablet; Refill: 2 - hydrOXYzine (VISTARIL) 25 MG capsule; Take 1 capsule (25 mg total) by mouth 3 (three) times daily as needed.  Dispense: 30 capsule; Refill: 0  2. Benzodiazepine dependence (HCC) - ALPRAZolam (XANAX) 0.5 MG tablet; Take 1 tablet (0.5 mg total) by mouth 2 (two) times daily as needed. for anxiety  Dispense: 60 tablet; Refill: 2 - hydrOXYzine (VISTARIL) 25 MG capsule; Take 1 capsule (25 mg total) by mouth 3 (three) times daily as needed.  Dispense: 30 capsule; Refill: 0  3. Controlled substance agreement signed   Long discussion with patient that we could not increase xanax back to TID. Will continue to BID.  Will add Vistaril as needed Stress management  RTO in 3 months      I discussed the assessment and treatment plan with the patient. The patient was provided an opportunity to ask questions and all were answered. The patient agreed with the plan and demonstrated an understanding of the instructions.   The patient was advised to call back or seek an in-person evaluation if the symptoms worsen or if the condition fails to improve as anticipated.  The above assessment and management plan was discussed with the patient. The patient verbalized understanding of and has agreed to the management plan. Patient is aware to call the clinic if symptoms persist or worsen. Patient is aware when to return to the clinic for a follow-up visit. Patient educated on when it is appropriate to go to the emergency department.   Time call ended:  3:15 pm   I provided 24 minutes of non-face-to-face time during this encounter.    [name], FNP

## 2020-04-04 ENCOUNTER — Other Ambulatory Visit: Payer: Self-pay

## 2020-04-04 ENCOUNTER — Ambulatory Visit (HOSPITAL_COMMUNITY)
Admission: RE | Admit: 2020-04-04 | Discharge: 2020-04-04 | Disposition: A | Discharge status: Home / Self Care | Payer: Medicare HMO | Source: Ambulatory Visit | Attending: Internal Medicine | Admitting: Internal Medicine

## 2020-04-04 DIAGNOSIS — J439 Emphysema, unspecified: Secondary | ICD-10-CM | POA: Diagnosis not present

## 2020-04-04 LAB — PULMONARY FUNCTION TEST
DL/VA % pred: 58 %
DL/VA: 2.37 ml/min/mmHg/L
DLCO unc % pred: 39 %
DLCO unc: 9.74 ml/min/mmHg
FEF 25-75 Post: 0.56 L/sec
FEF 25-75 Pre: 0.32 L/sec
FEF2575-%Change-Post: 74 %
FEF2575-%Pred-Post: 24 %
FEF2575-%Pred-Pre: 14 %
FEV1-%Change-Post: 8 %
FEV1-%Pred-Post: 33 %
FEV1-%Pred-Pre: 30 %
FEV1-Post: 1.02 L
FEV1-Pre: 0.94 L
FEV1FVC-%Change-Post: -1 %
FEV1FVC-%Pred-Pre: 54 %
FEV6-%Change-Post: 20 %
FEV6-%Pred-Post: 60 %
FEV6-%Pred-Pre: 50 %
FEV6-Post: 2.38 L
FEV6-Pre: 1.98 L
FEV6FVC-%Change-Post: 9 %
FEV6FVC-%Pred-Post: 97 %
FEV6FVC-%Pred-Pre: 88 %
FVC-%Change-Post: 10 %
FVC-%Pred-Post: 62 %
FVC-%Pred-Pre: 56 %
FVC-Post: 2.6 L
FVC-Pre: 2.37 L
Post FEV1/FVC ratio: 39 %
Post FEV6/FVC ratio: 91 %
Pre FEV1/FVC ratio: 40 %
Pre FEV6/FVC Ratio: 84 %

## 2020-04-04 MED ORDER — ALBUTEROL SULFATE (2.5 MG/3ML) 0.083% IN NEBU
2.5000 mg | INHALATION_SOLUTION | Freq: Once | RESPIRATORY_TRACT | Status: AC
  Administered 2020-04-04: 2.5 mg via RESPIRATORY_TRACT

## 2020-04-05 DIAGNOSIS — J439 Emphysema, unspecified: Secondary | ICD-10-CM | POA: Diagnosis not present

## 2020-04-06 ENCOUNTER — Encounter: Payer: Self-pay | Admitting: Internal Medicine

## 2020-04-06 NOTE — Telephone Encounter (Signed)
Lm for patient.  

## 2020-04-06 NOTE — Telephone Encounter (Signed)
Needs ov asap  with all meds in hand so I can clear him for procedure

## 2020-04-10 ENCOUNTER — Telehealth: Payer: Self-pay | Admitting: Internal Medicine

## 2020-04-10 ENCOUNTER — Telehealth (INDEPENDENT_AMBULATORY_CARE_PROVIDER_SITE_OTHER): Payer: Self-pay

## 2020-04-10 NOTE — Telephone Encounter (Signed)
Thanks

## 2020-04-10 NOTE — Telephone Encounter (Signed)
Please let her know that he needs to follow with Dr. Melvyn Novas in the pulmonary office. I saw his PFTs but Dr. Melvyn Novas needs to go though them with him and evaluate for pulmonary clearance during his office visit.  Thanks,  Maylon Peppers, MD Gastroenterology and Hepatology [facility]

## 2020-04-10 NOTE — Telephone Encounter (Signed)
I left voicemail for [name] of recommendation

## 2020-04-10 NOTE — Telephone Encounter (Signed)
Pt's ex wife, [name] is requesting pt's PFT results. She is listed on the pt's DPR.  Dr. Melvyn Novas - please advise. Thanks!

## 2020-04-10 NOTE — Telephone Encounter (Signed)
Ronald [name] is calling asking if we have received the results from the PFT from Dr. Melvyn Novas to move forward with the Upper Endoscopy, she has stated that Batu has woke up this morning throwing up Phelm, please advise?

## 2020-04-11 NOTE — Telephone Encounter (Signed)
Ronald [name] returning a phone call. Ronald [name] can be reached at [phone].

## 2020-04-11 NOTE — Telephone Encounter (Signed)
Called and spoke with [name], listed on DPR, regarding PFT results.  I advised that Dr. Melvyn Novas would discuss results at his OV.  Patient has no scheduled OV.  [name] stated that he needs to have his esophagus stretched and Dr. Ranae Pila office (Reba Alford Highland is the office manager) is waiting on the results of the PFT to see whether he cn have the procedure done.  I advised her I would let Dr. Melvyn Novas know and we would let her know when we hear back from Dr. Melvyn Novas.  She verbalized understanding.  Dr. Melvyn Novas, please advise if you would like for [place] to send something to Dr. Duncan Dull office and/or schedule a f/u visit for patient in [place].  Thank you.

## 2020-04-11 NOTE — Telephone Encounter (Signed)
Let's set up a televist as an add on Friday 11/5 and I'll call him when I finish seeing patients to go over study and see if I can clear him  (ok to put in empty slot sooner as ov or televisit if we have one but not as an add on)

## 2020-04-12 NOTE — Telephone Encounter (Signed)
Called and spoke with [name] letting her know that MW wanted [place] to add pt on schedule tomorrow 11/5 for a televisit so he could discuss results of PFT and to see if pt could be cleared to have esophagus stretched by Dr. Lily Peer. [name] verbalized understanding.  televisit has been added for pt. Nothing further needed.

## 2020-04-13 ENCOUNTER — Ambulatory Visit (INDEPENDENT_AMBULATORY_CARE_PROVIDER_SITE_OTHER): Payer: Medicare HMO | Admitting: Internal Medicine

## 2020-04-13 ENCOUNTER — Other Ambulatory Visit: Payer: Self-pay

## 2020-04-13 ENCOUNTER — Encounter: Payer: Self-pay | Admitting: Internal Medicine

## 2020-04-13 ENCOUNTER — Other Ambulatory Visit (INDEPENDENT_AMBULATORY_CARE_PROVIDER_SITE_OTHER): Payer: Self-pay | Admitting: Gastroenterology

## 2020-04-13 DIAGNOSIS — J398 Other specified diseases of upper respiratory tract: Secondary | ICD-10-CM | POA: Diagnosis not present

## 2020-04-13 DIAGNOSIS — J449 Chronic obstructive pulmonary disease, unspecified: Secondary | ICD-10-CM

## 2020-04-13 NOTE — Assessment & Plan Note (Signed)
Active smoker  - 12/08/2019  After extensive coaching inhaler device,  effectiveness =    90% with elipta > try trelegy  - PFT's  04/04/20  FEV1 1.02 (33 % ) ratio 0.39  p 8 % improvement from saba p saba 6h prior to study with DLCO  9.74(39%) corrects to 2.37 (58%)  for alv volume and FV curve atypical features both on insp and exp but no plateau    Group D in terms of symptom/risk and laba/lama/ICS  therefore appropriate rx at this point >>>  trelegy and prn saba adequate for now and no reason he can't have egd esp since the dyphagia may be leading to microaspiration/ cough   F/u in 2 weeks with all meds in hand
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
## 2020-04-13 NOTE — Patient Instructions (Signed)
No change in medications   Please schedule a follow up office visit in 4 weeks, sooner if needed  with all medications /inhalers/ solutions in hand so we can verify exactly what you are taking. This includes all medications from all doctors and over the counters

## 2020-04-13 NOTE — Assessment & Plan Note (Signed)
S/p Trach in 2016 "x 6 months" > f/u [facility]  - pfts 1027/21 :  FV curve atypical features both on insp and exp but no plateau   Has not seen ent yet by by the f/v loop is not flow limited at rest so no problem with proceeding with EGD

## 2020-04-16 ENCOUNTER — Telehealth (INDEPENDENT_AMBULATORY_CARE_PROVIDER_SITE_OTHER): Payer: Self-pay | Admitting: *Deleted

## 2020-04-16 ENCOUNTER — Other Ambulatory Visit (INDEPENDENT_AMBULATORY_CARE_PROVIDER_SITE_OTHER): Payer: Self-pay | Admitting: *Deleted

## 2020-04-16 ENCOUNTER — Encounter (INDEPENDENT_AMBULATORY_CARE_PROVIDER_SITE_OTHER): Payer: Self-pay | Admitting: *Deleted

## 2020-04-16 MED ORDER — PLENVU 140 G PO SOLR: 1.0000 | Freq: Once | ORAL | 0 refills | Status: AC

## 2020-04-16 NOTE — Telephone Encounter (Signed)
Patient needs Plenvu (copay card) ° °

## 2020-04-16 NOTE — Progress Notes (Signed)
Patient scheduled 05/18/20, instructions mailed

## 2020-04-17 ENCOUNTER — Telehealth: Payer: Self-pay

## 2020-04-19 MED ORDER — TRELEGY ELLIPTA 100-62.5-25 MCG/INH IN AEPB: INHALATION_SPRAY | RESPIRATORY_TRACT | 4 refills | Status: DC

## 2020-04-19 NOTE — Telephone Encounter (Signed)
Prescription printed

## 2020-04-19 NOTE — Telephone Encounter (Signed)
Front stated they gave ppw to you. Please advise.

## 2020-04-19 NOTE — Telephone Encounter (Signed)
States a form was dropped off this morning with information to fax rx to. Have you seen paper work?

## 2020-04-19 NOTE — Telephone Encounter (Signed)
I have not seen form. Maybe Almyra Free has this?

## 2020-04-20 DIAGNOSIS — R0602 Shortness of breath: Secondary | ICD-10-CM | POA: Diagnosis not present

## 2020-04-20 DIAGNOSIS — I10 Essential (primary) hypertension: Secondary | ICD-10-CM | POA: Diagnosis not present

## 2020-04-20 MED ORDER — TRELEGY ELLIPTA 100-62.5-25 MCG/INH IN AEPB: INHALATION_SPRAY | RESPIRATORY_TRACT | 4 refills | Status: DC

## 2020-04-20 NOTE — Telephone Encounter (Signed)
Please let patient know I called trelegy in to [facility] in [place] per request Still meet with me if they need assistance

## 2020-04-20 NOTE — Telephone Encounter (Signed)
Aware and verbalizes understanding.  

## 2020-04-20 NOTE — Telephone Encounter (Signed)
Spouse says that she needs a rx sent to [facility] in [place] for [place]. She says they have a program for pt to get a discount.

## 2020-04-20 NOTE — Addendum Note (Signed)
Addended by: Lottie Dawson D on: 04/20/2020 04:23 PM   Modules accepted: Orders

## 2020-04-23 ENCOUNTER — Other Ambulatory Visit: Payer: Self-pay

## 2020-04-23 ENCOUNTER — Ambulatory Visit (INDEPENDENT_AMBULATORY_CARE_PROVIDER_SITE_OTHER): Payer: Medicare HMO | Admitting: Pharmacist

## 2020-04-23 DIAGNOSIS — J449 Chronic obstructive pulmonary disease, unspecified: Secondary | ICD-10-CM | POA: Diagnosis not present

## 2020-04-23 MED ORDER — BREZTRI AEROSPHERE 160-9-4.8 MCG/ACT IN AERO: 2.0000 | INHALATION_SPRAY | Freq: Two times a day (BID) | RESPIRATORY_TRACT | 11 refills | Status: DC

## 2020-04-23 NOTE — Progress Notes (Signed)
HPI Patient presents today to see pharmacy team for COPD education and assistance.  Patient has recently seen Dr. Melvyn Novas on 04/13/20 (televisit) for pulmonology.  He is currently staged as COPD GOLD 3/still smoking.  Past medical history includes:   Past Medical History:  Diagnosis Date  . Anxiety   . Asthma   . Chronic lower back pain   . COPD (chronic obstructive pulmonary disease) ([place])   . COPD (chronic obstructive pulmonary disease) ([place])   . Coronary artery disease   . Depression   . GERD (gastroesophageal reflux disease)   . High cholesterol   . Hypertension   . NSTEMI (non-ST elevated myocardial infarction) ([place]) 05/2014   with stent placement  . Stroke ([place])    anyeusym   . TIA (transient ischemic attack)    "they say I've had some mini strokes; don't know when"; denies residual on 06/22/2014)  . Type II diabetes mellitus ([place])   . Ulcerative colitis ([place])    Number of hospitalizations in past year: 0 Number of COPD exacerbations in past year: 1  Respiratory Medications Current: trelegy (can't afford), albuterol Tried in past: symbicort, advair Patient reports cost issues with all inhalers  OBJECTIVE Allergies  Allergen Reactions  . Gabapentin Anxiety    Unknown reaction    Outpatient Encounter Medications as of 04/23/2020  Medication Sig  . albuterol (VENTOLIN HFA) 108 (90 Base) MCG/ACT inhaler INHALE 2 PUFFS EVERY 6 HOURS AS NEEEDED  . ALPRAZolam (XANAX) 0.5 MG tablet Take 1 tablet (0.5 mg total) by mouth 2 (two) times daily as needed. for anxiety  . amLODipine (NORVASC) 5 MG tablet Take 1 tablet (5 mg total) by mouth daily.  Marland Kitchen aspirin EC 81 MG tablet Take 1 tablet (81 mg total) by mouth daily.  Marland Kitchen atorvastatin (LIPITOR) 40 MG tablet TAKE 1 TABLET BY MOUTH DAILY AT 6 PM FOR CHOLESTEROL  . blood glucose meter kit and supplies Dispense based on patient and insurance preference. Use up to four times daily as directed. (FOR ICD-10 E10.9, E11.9).  . busPIRone  (BUSPAR) 7.5 MG tablet Take 1 tablet (7.5 mg total) by mouth 3 (three) times daily.  Marland Kitchen escitalopram (LEXAPRO) 20 MG tablet Take 1 tablet (20 mg total) by mouth daily.  . Fluticasone-Umeclidin-Vilant (TRELEGY ELLIPTA) 100-62.5-25 MCG/INH AEPB One click each am and take two good deep drags  . furosemide (LASIX) 20 MG tablet Take 1 tablet (20 mg total) by mouth 2 (two) times daily.  Marland Kitchen glucose blood (ONE TOUCH ULTRA TEST) test strip USE TO CHECK GLUCOSE ONCE DAILY  . hydrOXYzine (VISTARIL) 25 MG capsule Take 1 capsule (25 mg total) by mouth 3 (three) times daily as needed.  Marland Kitchen ipratropium-albuterol (DUONEB) 0.5-2.5 (3) MG/3ML SOLN USE ONE VIAL BY NEBULIZER FOUR TIMES DAILY  . Lancets (ONETOUCH ULTRASOFT) lancets Use as instructed   DX E11.9  . losartan (COZAAR) 100 MG tablet Take 1 tablet (100 mg total) by mouth daily.  . metoprolol tartrate (LOPRESSOR) 50 MG tablet Take 1 tablet (50 mg total) by mouth 2 (two) times daily.  . mirtazapine (REMERON) 15 MG tablet Take 1 tablet (15 mg total) by mouth at bedtime.  . nitroGLYCERIN (NITROSTAT) 0.4 MG SL tablet DISSOLVE TAKE 1 TABLET UNDER THE TONGUE EVERY FIVE MINUTES AS NEEDED FOR CHEST PAIN  . pantoprazole (PROTONIX) 40 MG tablet Take 1 tablet (40 mg total) by mouth daily.  . potassium chloride SA (KLOR-CON) 20 MEQ tablet Take 1 tablet (20 mEq total) by mouth 3 (three)  times daily.  . sitaGLIPtin (JANUVIA) 50 MG tablet Take 1 tablet (50 mg total) by mouth daily.   No facility-administered encounter medications on file as of 04/23/2020.     Immunization History  Administered Date(s) Administered  . Fluad Quad(high Dose 65+) 04/01/2019  . Influenza Split 03/09/2014  . Influenza-Unspecified 08/20/2016  . Pneumococcal Conjugate-13 11/19/2017  . Pneumococcal Polysaccharide-23 08/20/2016  . Tdap 10/06/1996, 06/13/2001     PFTs PFT Results Latest Ref Rng & Units 04/04/2020  FVC-Pre L 2.37  FVC-Predicted Pre % 56  FVC-Post L 2.60  FVC-Predicted Post  % 62  Pre FEV1/FVC % % 40  Post FEV1/FCV % % 39  FEV1-Pre L 0.94  FEV1-Predicted Pre % 30  FEV1-Post L 1.02  DLCO uncorrected ml/min/mmHg 9.74  DLCO UNC% % 39  DLVA Predicted % 58     Eosinophils Most recent blood eosinophil count was 3 cells/microL taken on 7//8/21.   IgE   - PFT's  04/04/20  FEV1 1.02 (33 % ) ratio 0.39  p 8 % improvement from saba p saba 6h prior to study with DLCO  9.74(39%) corrects to 2.37 (58%)  for alv volume and FV curve atypical features both on insp and exp but no plateau   Assessment   1. Inhaler Optimization  Optimal inhaler for patient would be BREZTRI considering ability to inhale trelegy.  Patient assistance program would make Breztri the easiest medication to obtain  Patient was counseled on the purpose, proper use, and adverse effects of Breztria inhaler.  Instructed patient to rinse mouth with water after using in order to prevent fungal infection.  Patient verbalized understanding.  Reviewed appropriate use of maintenance vs rescue inhalers.  Stressed importance of using maintenance inhaler daily and rescue inhaler only as needed.  Patient verbalized understanding.  Demonstrated proper inhaler technique using Breztri demo inhaler.  Patient able to demonstrate proper inhaler technique using teach back method. Patient was given sample in office today.  His inhaler was primed and able to administer first dose in office without issue.  2. Immunizations  Patient is indicated for the influenzae, pneumonia, and shingles vaccinations.  PLAN  SWITCH TO BREZTRI Inhaler  Samples given-->#3 INHALERS, [id number] S25, EXP 8/34  Application for AZ&me filled out and given to PCP for cosign  discussed methods of cutting back cigarette intake.  Patient to remove several cigarettes from his pack every few weeks to decreased the amount he is smoking daily.  He has already tried and failed patch, gum, chantix.  He wants to quit.  We may retry chantix generic  or nicotine replacement OTC at next visit  All questions encouraged and answered.  Instructed patient to reach out with any further questions or concerns.  Thank you for allowing pharmacy to participate in this patient's care.  This appointment required 25 minutes of patient care (this includes precharting, chart review, review of results, face-to-face care, etc.).   Regina Eck, PharmD, BCPS Clinical Pharmacist, [facility] Phone [phone]

## 2020-05-06 DIAGNOSIS — J439 Emphysema, unspecified: Secondary | ICD-10-CM | POA: Diagnosis not present

## 2020-05-07 ENCOUNTER — Ambulatory Visit: Payer: Medicare HMO | Admitting: Internal Medicine

## 2020-05-09 ENCOUNTER — Telehealth (INDEPENDENT_AMBULATORY_CARE_PROVIDER_SITE_OTHER): Payer: Self-pay | Admitting: *Deleted

## 2020-05-09 MED ORDER — PLENVU 140 G PO SOLR: 1.0000 | Freq: Once | ORAL | 0 refills | Status: DC

## 2020-05-09 NOTE — Telephone Encounter (Signed)
Patient needs Plenvu (copay card) ° °

## 2020-05-11 ENCOUNTER — Telehealth: Payer: Self-pay

## 2020-05-11 DIAGNOSIS — F1320 Sedative, hypnotic or anxiolytic dependence, uncomplicated: Secondary | ICD-10-CM

## 2020-05-11 DIAGNOSIS — F419 Anxiety disorder, unspecified: Secondary | ICD-10-CM

## 2020-05-11 MED ORDER — HYDROXYZINE PAMOATE 25 MG PO CAPS: 25.0000 mg | ORAL_CAPSULE | Freq: Three times a day (TID) | ORAL | 1 refills | Status: DC | PRN

## 2020-05-11 NOTE — Telephone Encounter (Signed)
Please review and advise.

## 2020-05-11 NOTE — Telephone Encounter (Signed)
Prescription sent to pharmacy.

## 2020-05-11 NOTE — Telephone Encounter (Signed)
  Prescription Request  05/11/2020  What is the name of the medication or equipment? hydrOXYzine (VISTARIL) 25 MG capsule  Have you contacted your pharmacy to request a refill? (if applicable) no  Which pharmacy would you like this sent to? [facility]   sitaGLIPtin (JANUVIA) 50 MG tablet is too expensive. Pt already tried metformin and he cannot take it. Pt asking can he try glipizide?  Please call back   Patient notified that their request is being sent to the clinical staff for review and that they should receive a response within 2 business days.

## 2020-05-11 NOTE — Patient Instructions (Signed)
Ronald [name]  05/11/2020     @PREFPERIOPPHARMACY @   Your procedure is scheduled on  05/18/2020.  Report to [facility] at  1130  A.M.  Call this number if you have problems the morning of surgery:  [phone]   Remember:  Follow the diet and prep instructions given to you by the office.                    Take these medicines the morning of surgery with A SIP OF WATER  Xanax(if needed), amlodipine, lexapro, hydroxyzine, metoprolol, protonix. Use your nebulizer and your inhalers before you come. DO NOT take any medications for diabetes the morning of your procedure.     Do not wear jewelry, make-up or nail polish.  Do not wear lotions, powders, or perfumes. Please wear deodorant and brush your teeth.  Do not shave 48 hours prior to surgery.  Men may shave face and neck.  Do not bring valuables to the hospital.  [facility] is not responsible for any belongings or valuables.  Contacts, dentures or bridgework may not be worn into surgery.  Leave your suitcase in the car.  After surgery it may be brought to your room.  For patients admitted to the hospital, discharge time will be determined by your treatment team.  Patients discharged the day of surgery will not be allowed to drive home.   Name and phone number of your driver:   family         Special instructions:  DO NOT smoke the morning of your procedure.  Please read over the following fact sheets that you were given. Anesthesia Post-op Instructions and Care and Recovery After Surgery       Upper Endoscopy, Adult, Care After This sheet gives you information about how to care for yourself after your procedure. Your health care provider may also give you more specific instructions. If you have problems or questions, contact your health care provider. What can I expect after the procedure? After the procedure, it is common to have:  A sore throat.  Mild stomach pain or  discomfort.  Bloating.  Nausea. Follow these instructions at home:   Follow instructions from your health care provider about what to eat or drink after your procedure.  Return to your normal activities as told by your health care provider. Ask your health care provider what activities are safe for you.  Take over-the-counter and prescription medicines only as told by your health care provider.  Do not drive for 24 hours if you were given a sedative during your procedure.  Keep all follow-up visits as told by your health care provider. This is important. Contact a health care provider if you have:  A sore throat that lasts longer than one day.  Trouble swallowing. Get help right away if:  You vomit blood or your vomit looks like coffee grounds.  You have: ? A fever. ? Bloody, black, or tarry stools. ? A severe sore throat or you cannot swallow. ? Difficulty breathing. ? Severe pain in your chest or abdomen. Summary  After the procedure, it is common to have a sore throat, mild stomach discomfort, bloating, and nausea.  Do not drive for 24 hours if you were given a sedative during the procedure.  Follow instructions from your health care provider about what to eat or drink after your procedure.  Return to your normal activities as told by  your health care provider. This information is not intended to replace advice given to you by your health care provider. Make sure you discuss any questions you have with your health care provider. Document Revised: 11/17/2017 Document Reviewed: 10/26/2017 Elsevier Patient Education  [place].  Esophageal Dilatation Esophageal dilatation, also called esophageal dilation, is a procedure to widen or open (dilate) a blocked or narrowed part of the esophagus. The esophagus is the part of the body that moves food and liquid from the mouth to the stomach. You may need this procedure if:  You have a buildup of scar tissue in your  esophagus that makes it difficult, painful, or impossible to swallow. This can be caused by gastroesophageal reflux disease (GERD).  You have cancer of the esophagus.  There is a problem with how food moves through your esophagus. In some cases, you may need this procedure repeated at a later time to dilate the esophagus gradually. Tell a health care provider about:  Any allergies you have.  All medicines you are taking, including vitamins, herbs, eye drops, creams, and over-the-counter medicines.  Any problems you or family members have had with anesthetic medicines.  Any blood disorders you have.  Any surgeries you have had.  Any medical conditions you have.  Any antibiotic medicines you are required to take before dental procedures.  Whether you are pregnant or may be pregnant. What are the risks? Generally, this is a safe procedure. However, problems may occur, including:  Bleeding due to a tear in the lining of the esophagus.  A hole (perforation) in the esophagus. What happens before the procedure?  Follow instructions from your health care provider about eating or drinking restrictions.  Ask your health care provider about changing or stopping your regular medicines. This is especially important if you are taking diabetes medicines or blood thinners.  Plan to have someone take you home from the hospital or clinic.  Plan to have a responsible adult care for you for at least 24 hours after you leave the hospital or clinic. This is important. What happens during the procedure?  You may be given a medicine to help you relax (sedative).  A numbing medicine may be sprayed into the back of your throat, or you may gargle the medicine.  Your health care provider may perform the dilatation using various surgical instruments, such as: ? Simple dilators. This instrument is carefully placed in the esophagus to stretch it. ? Guided wire bougies. This involves using an endoscope  to insert a wire into the esophagus. A dilator is passed over this wire to enlarge the esophagus. Then the wire is removed. ? Balloon dilators. An endoscope with a small balloon at the end is inserted into the esophagus. The balloon is inflated to stretch the esophagus and open it up. The procedure may vary among health care providers and hospitals. What happens after the procedure?  Your blood pressure, heart rate, breathing rate, and blood oxygen level will be monitored until the medicines you were given have worn off.  Your throat may feel slightly sore and numb. This will improve slowly over time.  You will not be allowed to eat or drink until your throat is no longer numb.  When you are able to drink, urinate, and sit on the edge of the bed without nausea or dizziness, you may be able to return home. Follow these instructions at home:  Take over-the-counter and prescription medicines only as told by your health care provider.  Do not drive for 24 hours if you were given a sedative during your procedure.  You should have a responsible adult with you for 24 hours after the procedure.  Follow instructions from your health care provider about any eating or drinking restrictions.  Do not use any products that contain nicotine or tobacco, such as cigarettes and e-cigarettes. If you need help quitting, ask your health care provider.  Keep all follow-up visits as told by your health care provider. This is important. Get help right away if you:  Have a fever.  Have chest pain.  Have pain that is not relieved by medication.  Have trouble breathing.  Have trouble swallowing.  Vomit blood. Summary  Esophageal dilatation, also called esophageal dilation, is a procedure to widen or open (dilate) a blocked or narrowed part of the esophagus.  Plan to have someone take you home from the hospital or clinic.  For this procedure, a numbing medicine may be sprayed into the back of your  throat, or you may gargle the medicine.  Do not drive for 24 hours if you were given a sedative during your procedure. This information is not intended to replace advice given to you by your health care provider. Make sure you discuss any questions you have with your health care provider. Document Revised: 03/23/2019 Document Reviewed: 03/31/2017 Elsevier Patient Education  [place].  Colonoscopy, Adult, Care After This sheet gives you information about how to care for yourself after your procedure. Your health care provider may also give you more specific instructions. If you have problems or questions, contact your health care provider. What can I expect after the procedure? After the procedure, it is common to have:  A small amount of blood in your stool for 24 hours after the procedure.  Some gas.  Mild cramping or bloating of your abdomen. Follow these instructions at home: Eating and drinking   Drink enough fluid to keep your urine pale yellow.  Follow instructions from your health care provider about eating or drinking restrictions.  Resume your normal diet as instructed by your health care provider. Avoid heavy or fried foods that are hard to digest. Activity  Rest as told by your health care provider.  Avoid sitting for a long time without moving. Get up to take short walks every 1-2 hours. This is important to improve blood flow and breathing. Ask for help if you feel weak or unsteady.  Return to your normal activities as told by your health care provider. Ask your health care provider what activities are safe for you. Managing cramping and bloating   Try walking around when you have cramps or feel bloated.  Apply heat to your abdomen as told by your health care provider. Use the heat source that your health care provider recommends, such as a moist heat pack or a heating pad. ? Place a towel between your skin and the heat source. ? Leave the heat on for  20-30 minutes. ? Remove the heat if your skin turns bright red. This is especially important if you are unable to feel pain, heat, or cold. You may have a greater risk of getting burned. General instructions  For the first 24 hours after the procedure: ? Do not drive or use machinery. ? Do not sign important documents. ? Do not drink alcohol. ? Do your regular daily activities at a slower pace than normal. ? Eat soft foods that are easy to digest.  Take over-the-counter and prescription  medicines only as told by your health care provider.  Keep all follow-up visits as told by your health care provider. This is important. Contact a health care provider if:  You have blood in your stool 2-3 days after the procedure. Get help right away if you have:  More than a small spotting of blood in your stool.  Large blood clots in your stool.  Swelling of your abdomen.  Nausea or vomiting.  A fever.  Increasing pain in your abdomen that is not relieved with medicine. Summary  After the procedure, it is common to have a small amount of blood in your stool. You may also have mild cramping and bloating of your abdomen.  For the first 24 hours after the procedure, do not drive or use machinery, sign important documents, or drink alcohol.  Get help right away if you have a lot of blood in your stool, nausea or vomiting, a fever, or increased pain in your abdomen. This information is not intended to replace advice given to you by your health care provider. Make sure you discuss any questions you have with your health care provider. Document Revised: 12/20/2018 Document Reviewed: 12/20/2018 Elsevier Patient Education  [place] After These instructions provide you with information about caring for yourself after your procedure. Your health care provider may also give you more specific instructions. Your treatment has been planned according to current  medical practices, but problems sometimes occur. Call your health care provider if you have any problems or questions after your procedure. What can I expect after the procedure? After your procedure, you may:  Feel sleepy for several hours.  Feel clumsy and have poor balance for several hours.  Feel forgetful about what happened after the procedure.  Have poor judgment for several hours.  Feel nauseous or vomit.  Have a sore throat if you had a breathing tube during the procedure. Follow these instructions at home: For at least 24 hours after the procedure:      Have a responsible adult stay with you. It is important to have someone help care for you until you are awake and alert.  Rest as needed.  Do not: ? Participate in activities in which you could fall or become injured. ? Drive. ? Use heavy machinery. ? Drink alcohol. ? Take sleeping pills or medicines that cause drowsiness. ? Make important decisions or sign legal documents. ? Take care of children on your own. Eating and drinking  Follow the diet that is recommended by your health care provider.  If you vomit, drink water, juice, or soup when you can drink without vomiting.  Make sure you have little or no nausea before eating solid foods. General instructions  Take over-the-counter and prescription medicines only as told by your health care provider.  If you have sleep apnea, surgery and certain medicines can increase your risk for breathing problems. Follow instructions from your health care provider about wearing your sleep device: ? Anytime you are sleeping, including during daytime naps. ? While taking prescription pain medicines, sleeping medicines, or medicines that make you drowsy.  If you smoke, do not smoke without supervision.  Keep all follow-up visits as told by your health care provider. This is important. Contact a health care provider if:  You keep feeling nauseous or you keep  vomiting.  You feel light-headed.  You develop a rash.  You have a fever. Get help right away if:  You have trouble breathing.  Summary  For several hours after your procedure, you may feel sleepy and have poor judgment.  Have a responsible adult stay with you for at least 24 hours or until you are awake and alert. This information is not intended to replace advice given to you by your health care provider. Make sure you discuss any questions you have with your health care provider. Document Revised: 08/24/2017 Document Reviewed: 09/16/2015 Elsevier Patient Education  [place].

## 2020-05-14 ENCOUNTER — Telehealth: Payer: Self-pay | Admitting: *Deleted

## 2020-05-14 NOTE — Telephone Encounter (Signed)
PA came in today for pt for med:  hydrOXYzine Pamoate 25MG  capsules  Key: [id number]  (pt has tried 2 or more of the alternatives listed)   Sent to [facility] today

## 2020-05-14 NOTE — Telephone Encounter (Signed)
Approved today Your request has been approved  WM called and aware

## 2020-05-16 ENCOUNTER — Other Ambulatory Visit (HOSPITAL_COMMUNITY)
Admission: RE | Admit: 2020-05-16 | Discharge: 2020-05-16 | Disposition: A | Discharge status: Home / Self Care | Payer: Medicare HMO | Source: Ambulatory Visit | Attending: Gastroenterology | Admitting: Gastroenterology

## 2020-05-16 ENCOUNTER — Telehealth: Payer: Self-pay

## 2020-05-16 ENCOUNTER — Encounter (HOSPITAL_COMMUNITY): Payer: Self-pay

## 2020-05-16 ENCOUNTER — Other Ambulatory Visit: Payer: Self-pay

## 2020-05-16 ENCOUNTER — Encounter (HOSPITAL_COMMUNITY): Payer: Self-pay | Admitting: Anesthesiology

## 2020-05-16 ENCOUNTER — Encounter (HOSPITAL_COMMUNITY)
Admission: RE | Admit: 2020-05-16 | Discharge: 2020-05-16 | Disposition: A | Discharge status: Home / Self Care | Payer: Medicare HMO | Source: Ambulatory Visit | Attending: Gastroenterology | Admitting: Gastroenterology

## 2020-05-16 DIAGNOSIS — Z01818 Encounter for other preprocedural examination: Secondary | ICD-10-CM | POA: Diagnosis not present

## 2020-05-16 DIAGNOSIS — R079 Chest pain, unspecified: Secondary | ICD-10-CM

## 2020-05-16 DIAGNOSIS — Z20822 Contact with and (suspected) exposure to covid-19: Secondary | ICD-10-CM | POA: Insufficient documentation

## 2020-05-16 DIAGNOSIS — I700 Atherosclerosis of aorta: Secondary | ICD-10-CM

## 2020-05-16 DIAGNOSIS — I2510 Atherosclerotic heart disease of native coronary artery without angina pectoris: Secondary | ICD-10-CM

## 2020-05-16 MED ORDER — NITROGLYCERIN 0.4 MG SL SUBL: SUBLINGUAL_TABLET | SUBLINGUAL | 5 refills | Status: DC

## 2020-05-16 MED ORDER — ACCU-CHEK GUIDE ME W/DEVICE KIT: PACK | 0 refills | Status: DC

## 2020-05-16 NOTE — Telephone Encounter (Signed)
Aware refill sent to pharmacy ?

## 2020-05-16 NOTE — Telephone Encounter (Signed)
Referral placed to Cardiologists

## 2020-05-16 NOTE — Telephone Encounter (Signed)
  Prescription Request  05/16/2020  What is the name of the medication or equipment? nitroGLYCERIN (NITROSTAT) 0.4 MG SL tablet  Have you contacted your pharmacy to request a refill? (if applicable) no  Which pharmacy would you like this sent to? [facility]   Patient notified that their request is being sent to the clinical staff for review and that they should receive a response within 2 business days.

## 2020-05-16 NOTE — Telephone Encounter (Signed)
  Prescription Request  05/16/2020  What is the name of the medication or equipment? Needs a RX called in for a One Touch ultra mini machine, lancet and strips.  Have you contacted your pharmacy to request a refill? (if applicable) NO  Which pharmacy would you like this sent to? [facility] in [place]   Patient notified that their request is being sent to the clinical staff for review and that they should receive a response within 2 business days.

## 2020-05-16 NOTE — Telephone Encounter (Signed)
Patient went for his preop today and he needs a referral for a new cardiologist because his retired 6 months ago. During the visit they also suggested that he see his pulmonologist but he just saw him in November so not sure if he would need a referral again. Please review

## 2020-05-16 NOTE — Progress Notes (Signed)
Patient here for patient admissions testing prior to Colonoscopy/EGD.  He had an MI in 2015 with stent  Placement.  Patient has not followed up with cardiology since the MI or Stent Placement.  Procedure is cancelled for now. He was instructed along with his niece to follow up with the cardiology and pulmonary physicians.  Once we get clearance from both specialties then we will proceed with surgery.

## 2020-05-18 ENCOUNTER — Ambulatory Visit (HOSPITAL_COMMUNITY): Admission: RE | Admit: 2020-05-18 | Payer: Medicare HMO | Source: Home / Self Care | Admitting: Gastroenterology

## 2020-05-18 ENCOUNTER — Encounter (HOSPITAL_COMMUNITY): Admission: RE | Payer: Self-pay | Source: Home / Self Care

## 2020-05-18 SURGERY — COLONOSCOPY WITH PROPOFOL
Anesthesia: Monitor Anesthesia Care
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
## 2020-05-21 ENCOUNTER — Telehealth: Payer: Self-pay | Admitting: Family Medicine

## 2020-05-21 ENCOUNTER — Encounter: Payer: Self-pay | Admitting: Family Medicine

## 2020-05-21 ENCOUNTER — Encounter: Payer: Self-pay | Admitting: *Deleted

## 2020-05-21 ENCOUNTER — Ambulatory Visit (INDEPENDENT_AMBULATORY_CARE_PROVIDER_SITE_OTHER): Payer: Medicare HMO | Admitting: Family Medicine

## 2020-05-21 ENCOUNTER — Other Ambulatory Visit: Payer: Self-pay

## 2020-05-21 ENCOUNTER — Telehealth: Payer: Self-pay | Admitting: Family

## 2020-05-21 VITALS — BP 150/82 | HR 63 | Ht 69.0 in | Wt 187.0 lb

## 2020-05-21 DIAGNOSIS — Z720 Tobacco use: Secondary | ICD-10-CM

## 2020-05-21 DIAGNOSIS — Z01818 Encounter for other preprocedural examination: Secondary | ICD-10-CM | POA: Diagnosis not present

## 2020-05-21 DIAGNOSIS — I2510 Atherosclerotic heart disease of native coronary artery without angina pectoris: Secondary | ICD-10-CM

## 2020-05-21 DIAGNOSIS — I10 Essential (primary) hypertension: Secondary | ICD-10-CM

## 2020-05-21 DIAGNOSIS — R079 Chest pain, unspecified: Secondary | ICD-10-CM

## 2020-05-21 DIAGNOSIS — E782 Mixed hyperlipidemia: Secondary | ICD-10-CM

## 2020-05-21 NOTE — Telephone Encounter (Signed)
Pre-cert Verification for the following procedure    LEXISCAN    DATE: 05/28/2020  LOCATION: [facility]

## 2020-05-21 NOTE — Patient Instructions (Signed)
Your physician recommends that you schedule a follow-up appointment in: [place], NP  Your physician recommends that you continue on your current medications as directed. Please refer to the Current Medication list given to you today.  Your physician has requested that you have a lexiscan myoview. For further information please visit [email or web address]. Please follow instruction sheet, as given.  Thank you for choosing [facility]!!

## 2020-05-22 ENCOUNTER — Telehealth: Payer: Self-pay

## 2020-05-22 DIAGNOSIS — K219 Gastro-esophageal reflux disease without esophagitis: Secondary | ICD-10-CM

## 2020-05-22 MED ORDER — PANTOPRAZOLE SODIUM 40 MG PO TBEC: 40.0000 mg | DELAYED_RELEASE_TABLET | Freq: Every day | ORAL | 1 refills | Status: DC

## 2020-05-22 NOTE — Telephone Encounter (Signed)
Pt aware rx sent over to pharmacy.

## 2020-05-23 ENCOUNTER — Observation Stay (HOSPITAL_COMMUNITY)
Admission: EM | Admit: 2020-05-23 | Discharge: 2020-05-26 | Disposition: A | Discharge status: Home / Self Care | Payer: Medicare HMO | Attending: Emergency Medicine | Admitting: Emergency Medicine

## 2020-05-23 ENCOUNTER — Emergency Department (HOSPITAL_COMMUNITY): Payer: Medicare HMO

## 2020-05-23 ENCOUNTER — Other Ambulatory Visit: Payer: Self-pay

## 2020-05-23 DIAGNOSIS — F17210 Nicotine dependence, cigarettes, uncomplicated: Secondary | ICD-10-CM | POA: Diagnosis not present

## 2020-05-23 DIAGNOSIS — E1142 Type 2 diabetes mellitus with diabetic polyneuropathy: Secondary | ICD-10-CM

## 2020-05-23 DIAGNOSIS — J9611 Chronic respiratory failure with hypoxia: Secondary | ICD-10-CM | POA: Diagnosis present

## 2020-05-23 DIAGNOSIS — J441 Chronic obstructive pulmonary disease with (acute) exacerbation: Secondary | ICD-10-CM | POA: Diagnosis present

## 2020-05-23 DIAGNOSIS — I152 Hypertension secondary to endocrine disorders: Secondary | ICD-10-CM

## 2020-05-23 DIAGNOSIS — Z794 Long term (current) use of insulin: Secondary | ICD-10-CM | POA: Diagnosis not present

## 2020-05-23 DIAGNOSIS — I509 Heart failure, unspecified: Secondary | ICD-10-CM | POA: Insufficient documentation

## 2020-05-23 DIAGNOSIS — J45909 Unspecified asthma, uncomplicated: Secondary | ICD-10-CM | POA: Diagnosis not present

## 2020-05-23 DIAGNOSIS — Z79899 Other long term (current) drug therapy: Secondary | ICD-10-CM | POA: Insufficient documentation

## 2020-05-23 DIAGNOSIS — Z7982 Long term (current) use of aspirin: Secondary | ICD-10-CM | POA: Diagnosis not present

## 2020-05-23 DIAGNOSIS — Z7951 Long term (current) use of inhaled steroids: Secondary | ICD-10-CM | POA: Diagnosis not present

## 2020-05-23 DIAGNOSIS — I10 Essential (primary) hypertension: Secondary | ICD-10-CM | POA: Diagnosis present

## 2020-05-23 DIAGNOSIS — I2510 Atherosclerotic heart disease of native coronary artery without angina pectoris: Secondary | ICD-10-CM | POA: Diagnosis not present

## 2020-05-23 DIAGNOSIS — J449 Chronic obstructive pulmonary disease, unspecified: Secondary | ICD-10-CM | POA: Diagnosis not present

## 2020-05-23 DIAGNOSIS — R079 Chest pain, unspecified: Secondary | ICD-10-CM | POA: Diagnosis not present

## 2020-05-23 DIAGNOSIS — Z20822 Contact with and (suspected) exposure to covid-19: Secondary | ICD-10-CM | POA: Insufficient documentation

## 2020-05-23 DIAGNOSIS — R69 Illness, unspecified: Secondary | ICD-10-CM | POA: Diagnosis not present

## 2020-05-23 DIAGNOSIS — I110 Hypertensive heart disease with heart failure: Secondary | ICD-10-CM | POA: Diagnosis not present

## 2020-05-23 DIAGNOSIS — E119 Type 2 diabetes mellitus without complications: Secondary | ICD-10-CM

## 2020-05-23 DIAGNOSIS — Z955 Presence of coronary angioplasty implant and graft: Secondary | ICD-10-CM | POA: Diagnosis not present

## 2020-05-23 DIAGNOSIS — J90 Pleural effusion, not elsewhere classified: Secondary | ICD-10-CM | POA: Diagnosis not present

## 2020-05-23 DIAGNOSIS — I5030 Unspecified diastolic (congestive) heart failure: Secondary | ICD-10-CM

## 2020-05-23 DIAGNOSIS — J398 Other specified diseases of upper respiratory tract: Secondary | ICD-10-CM | POA: Diagnosis present

## 2020-05-23 DIAGNOSIS — Z720 Tobacco use: Secondary | ICD-10-CM | POA: Diagnosis present

## 2020-05-23 DIAGNOSIS — E1159 Type 2 diabetes mellitus with other circulatory complications: Secondary | ICD-10-CM

## 2020-05-23 LAB — BASIC METABOLIC PANEL
Anion gap: 7 (ref 5–15)
BUN: 23 mg/dL (ref 8–23)
CO2: 30 mmol/L (ref 22–32)
Calcium: 9.1 mg/dL (ref 8.9–10.3)
Chloride: 98 mmol/L (ref 98–111)
Creatinine, Ser: 0.91 mg/dL (ref 0.61–1.24)
GFR, Estimated: >60 mL/min (ref >60)
Glucose, Bld: 175 mg/dL — ABNORMAL HIGH (ref 70–99)
Potassium: 4.3 mmol/L (ref 3.5–5.1)
Sodium: 135 mmol/L (ref 135–145)

## 2020-05-23 LAB — TROPONIN I (HIGH SENSITIVITY)
Troponin I (High Sensitivity): 33 ng/L — ABNORMAL HIGH (ref <18)
Troponin I (High Sensitivity): 35 ng/L — ABNORMAL HIGH (ref <18)
Troponin I (High Sensitivity): 34 ng/L — ABNORMAL HIGH (ref <18)

## 2020-05-23 LAB — CBC
HCT: 43 % (ref 39.0–52.0)
Hemoglobin: 14.4 g/dL (ref 13.0–17.0)
MCH: 30 pg (ref 26.0–34.0)
MCHC: 33.5 g/dL (ref 30.0–36.0)
MCV: 89.6 fL (ref 80.0–100.0)
Platelets: 368 10*3/uL (ref 150–400)
RBC: 4.8 MIL/uL (ref 4.22–5.81)
RDW: 12.8 % (ref 11.5–15.5)
WBC: 9.9 10*3/uL (ref 4.0–10.5)
nRBC: 0 % (ref 0.0–0.2)

## 2020-05-23 LAB — CBG MONITORING, ED: Glucose-Capillary: 99 mg/dL (ref 70–99)

## 2020-05-23 LAB — RESP PANEL BY RT-PCR (FLU A&B, COVID) ARPGX2
Influenza A by PCR: NEGATIVE
Influenza B by PCR: NEGATIVE
SARS Coronavirus 2 by RT PCR: NEGATIVE

## 2020-05-23 MED ORDER — INSULIN ASPART 100 UNIT/ML ~~LOC~~ SOLN: 0.0000 [IU] | Freq: Every day | SUBCUTANEOUS | Status: DC

## 2020-05-23 MED ORDER — ASPIRIN EC 81 MG PO TBEC
81.0000 mg | DELAYED_RELEASE_TABLET | Freq: Every day | ORAL | Status: DC
  Administered 2020-05-24 – 2020-05-26 (×2): 81 mg via ORAL
  Filled 2020-05-23 (×3): qty 1

## 2020-05-23 MED ORDER — ACETAMINOPHEN 325 MG PO TABS
650.0000 mg | ORAL_TABLET | Freq: Four times a day (QID) | ORAL | Status: DC | PRN
  Administered 2020-05-24 – 2020-05-25 (×2): 650 mg via ORAL
  Filled 2020-05-23 (×3): qty 2

## 2020-05-23 MED ORDER — METOPROLOL TARTRATE 50 MG PO TABS
50.0000 mg | ORAL_TABLET | Freq: Two times a day (BID) | ORAL | Status: DC
  Administered 2020-05-23: 50 mg via ORAL
  Filled 2020-05-23: qty 1

## 2020-05-23 MED ORDER — ESCITALOPRAM OXALATE 10 MG PO TABS
20.0000 mg | ORAL_TABLET | Freq: Every day | ORAL | Status: DC
  Administered 2020-05-24 – 2020-05-26 (×3): 20 mg via ORAL
  Filled 2020-05-23 (×3): qty 2

## 2020-05-23 MED ORDER — ENOXAPARIN SODIUM 40 MG/0.4ML ~~LOC~~ SOLN
40.0000 mg | SUBCUTANEOUS | Status: DC
  Administered 2020-05-23 – 2020-05-24 (×2): 40 mg via SUBCUTANEOUS
  Filled 2020-05-23 (×2): qty 0.4

## 2020-05-23 MED ORDER — NITROGLYCERIN 0.4 MG SL SUBL: 0.4000 mg | SUBLINGUAL_TABLET | SUBLINGUAL | Status: DC | PRN

## 2020-05-23 MED ORDER — DOCUSATE SODIUM 100 MG PO CAPS
100.0000 mg | ORAL_CAPSULE | Freq: Two times a day (BID) | ORAL | Status: DC
  Administered 2020-05-23 – 2020-05-26 (×5): 100 mg via ORAL
  Filled 2020-05-23 (×5): qty 1

## 2020-05-23 MED ORDER — INSULIN ASPART 100 UNIT/ML ~~LOC~~ SOLN
0.0000 [IU] | Freq: Three times a day (TID) | SUBCUTANEOUS | Status: DC
  Administered 2020-05-24: 1 [IU] via SUBCUTANEOUS
  Administered 2020-05-25: 2 [IU] via SUBCUTANEOUS
  Filled 2020-05-23: qty 1

## 2020-05-23 MED ORDER — ATORVASTATIN CALCIUM 40 MG PO TABS
40.0000 mg | ORAL_TABLET | Freq: Every evening | ORAL | Status: DC
  Administered 2020-05-23 – 2020-05-25 (×3): 40 mg via ORAL
  Filled 2020-05-23 (×3): qty 1

## 2020-05-23 MED ORDER — ALPRAZOLAM 0.5 MG PO TABS
0.5000 mg | ORAL_TABLET | Freq: Two times a day (BID) | ORAL | Status: DC | PRN
  Administered 2020-05-24: 0.5 mg via ORAL
  Filled 2020-05-23 (×2): qty 1

## 2020-05-23 MED ORDER — LOSARTAN POTASSIUM 50 MG PO TABS
100.0000 mg | ORAL_TABLET | Freq: Every day | ORAL | Status: DC
  Administered 2020-05-24 – 2020-05-26 (×3): 100 mg via ORAL
  Filled 2020-05-23: qty 4
  Filled 2020-05-23 (×2): qty 2

## 2020-05-23 MED ORDER — ONDANSETRON HCL 4 MG PO TABS: 4.0000 mg | ORAL_TABLET | Freq: Four times a day (QID) | ORAL | Status: DC | PRN

## 2020-05-23 MED ORDER — MIRTAZAPINE 15 MG PO TABS
15.0000 mg | ORAL_TABLET | Freq: Every day | ORAL | Status: DC
  Administered 2020-05-23 – 2020-05-25 (×3): 15 mg via ORAL
  Filled 2020-05-23 (×3): qty 1

## 2020-05-23 MED ORDER — FUROSEMIDE 20 MG PO TABS
20.0000 mg | ORAL_TABLET | Freq: Two times a day (BID) | ORAL | Status: DC
Start: 2020-05-24 — End: 2020-05-25
  Administered 2020-05-24 (×2): 20 mg via ORAL
  Filled 2020-05-23 (×3): qty 1

## 2020-05-23 MED ORDER — AMLODIPINE BESYLATE 10 MG PO TABS
10.0000 mg | ORAL_TABLET | Freq: Every day | ORAL | Status: DC
  Administered 2020-05-24 – 2020-05-26 (×3): 10 mg via ORAL
  Filled 2020-05-23: qty 1
  Filled 2020-05-23 (×2): qty 2

## 2020-05-23 MED ORDER — HYDRALAZINE HCL 20 MG/ML IJ SOLN
10.0000 mg | INTRAMUSCULAR | Status: DC | PRN
  Administered 2020-05-23 – 2020-05-24 (×2): 10 mg via INTRAVENOUS
  Filled 2020-05-23 (×2): qty 1

## 2020-05-23 MED ORDER — ASPIRIN 81 MG PO CHEW
324.0000 mg | CHEWABLE_TABLET | Freq: Once | ORAL | Status: AC
  Administered 2020-05-23: 324 mg via ORAL
  Filled 2020-05-23: qty 4

## 2020-05-23 MED ORDER — BUDESON-GLYCOPYRROL-FORMOTEROL 160-9-4.8 MCG/ACT IN AERO
2.0000 | INHALATION_SPRAY | Freq: Two times a day (BID) | RESPIRATORY_TRACT | Status: DC
  Administered 2020-05-24 – 2020-05-26 (×2): 2 via RESPIRATORY_TRACT

## 2020-05-23 MED ORDER — IPRATROPIUM-ALBUTEROL 0.5-2.5 (3) MG/3ML IN SOLN: 3.0000 mL | Freq: Four times a day (QID) | RESPIRATORY_TRACT | Status: DC | PRN

## 2020-05-23 MED ORDER — ONDANSETRON HCL 4 MG/2ML IJ SOLN: 4.0000 mg | Freq: Four times a day (QID) | INTRAMUSCULAR | Status: DC | PRN

## 2020-05-23 MED ORDER — POLYETHYLENE GLYCOL 3350 17 G PO PACK: 17.0000 g | PACK | Freq: Every day | ORAL | Status: DC | PRN

## 2020-05-23 MED ORDER — ACETAMINOPHEN 650 MG RE SUPP: 650.0000 mg | Freq: Four times a day (QID) | RECTAL | Status: DC | PRN

## 2020-05-23 NOTE — ED Provider Notes (Signed)
[facility] EMERGENCY DEPARTMENT Provider Note   CSN: [id number] Arrival date & time: 05/23/20  1345     History Chief Complaint  Patient presents with  . Chest Pain    CAYDIN YEATTS is a 74 y.o. male.  74 year old male with past medical history of NSTEMI, stent x3, diabetes, CVA, hypertension, hyperlipidemia, daily smoker presents with complaint of chest pain.  Patient states that 10:00 this morning he developed midsternal to left-sided chest pain which was sharp and constant in nature x30 minutes, resolved after taking 3 nitro and resting.  Denies associated shortness of breath or diaphoresis.  States he did have 1 episode of nausea and vomiting.  Pain has completely resolved as of this time.  No other complaints or concerns.     HPI: A 74 year old patient with a history of CVA, treated diabetes, hypertension and hypercholesterolemia presents for evaluation of chest pain. Initial onset of pain was more than 6 hours ago. The patient's chest pain is sharp, is not worse with exertion and is relieved by nitroglycerin. The patient complains of nausea. The patient's chest pain is middle- or left-sided, is not well-localized, is not described as heaviness/pressure/tightness and does not radiate to the arms/jaw/neck. The patient denies diaphoresis. The patient has smoked in the past 90 days. The patient has no history of peripheral artery disease, has no relevant family history of coronary artery disease (first degree relative at less than age 67) and does not have an elevated BMI (>=30).   Past Medical History:  Diagnosis Date  . Anxiety   . Asthma   . Chronic lower back pain   . COPD (chronic obstructive pulmonary disease) ([place])   . COPD (chronic obstructive pulmonary disease) ([place])   . Coronary artery disease   . Depression   . GERD (gastroesophageal reflux disease)   . High cholesterol   . Hypertension   . NSTEMI (non-ST elevated myocardial infarction) ([place]) 05/2014   with stent  placement  . Stroke ([place]) 2017   anyeusym   . TIA (transient ischemic attack)    "they say I've had some mini strokes; don't know when"; denies residual on 06/22/2014)  . Type II diabetes mellitus ([place])   . Ulcerative colitis [facility])     Patient Active Problem List   Diagnosis Date Noted  . Chest pain 05/23/2020  . Controlled substance agreement signed 04/03/2020  . Benzodiazepine dependence ([place]) 04/03/2020  . Educated about COVID-19 virus infection 03/06/2020  . Dysphagia 02/27/2020  . Diarrhea 02/27/2020  . Tracheal stenosis 12/09/2019  . Noncompliance 04/01/2019  . Lymphedema 04/01/2019  . CHF (congestive heart failure) ([place]) 08/27/2018  . Aortic atherosclerosis ([place]) 05/11/2017  . Depression 05/05/2017  . GERD (gastroesophageal reflux disease) 01/16/2017  . Diabetic neuropathy ([place]) 01/16/2017  . Anxiety 02/14/2016  . Chronic respiratory failure with hypoxia ([place]) 07/19/2014  . Hypertension associated with diabetes ([place]) 07/19/2014  . Cigarette smoker 07/19/2014  . Chest pain at rest 06/22/2014  . CAD in native artery 06/22/2014  . Diabetes mellitus ([place]) 06/22/2014  . COPD GOLD 3 / still smoking 06/22/2014  . Tobacco abuse 06/22/2014  . Acute encephalopathy 06/22/2014  . Leucocytosis 06/22/2014    Past Surgical History:  Procedure Laterality Date  . APPENDECTOMY    . CARDIAC CATHETERIZATION  1990's X 3  . CHOLECYSTECTOMY    . CORONARY ANGIOPLASTY WITH STENT PLACEMENT  05/2014   "2"  . TUMOR EXCISION Right ~ 1999   "side of my upper head"  Family History  Problem Relation Age of Onset  . CAD Father   . Lung cancer Brother        smoked  . Cancer Brother        lung  . Leukemia Sister   . Dementia Sister   . Stroke Mother   . Emphysema Sister   . Cancer Brother        lung    Social History   Tobacco Use  . Smoking status: Current Every Day Smoker    Packs/day: 1.50    Years: 48.00    Pack years: 72.00    Types: Cigarettes    Start date:  02/12/1966  . Smokeless tobacco: Never Used  Vaping Use  . Vaping Use: Never used  Substance Use Topics  . Alcohol use: Not Currently    Alcohol/week: 0.0 standard drinks  . Drug use: No    Home Medications Prior to Admission medications   Medication Sig Start Date End Date Taking? Authorizing Provider  albuterol (VENTOLIN HFA) 108 (90 Base) MCG/ACT inhaler INHALE 2 PUFFS EVERY 6 HOURS AS NEEEDED Patient taking differently: Inhale 2 puffs into the lungs every 6 (six) hours as needed for wheezing or shortness of breath. 11/02/19  Yes Hawks, Christy A, FNP  ALPRAZolam (XANAX) 0.5 MG tablet Take 1 tablet (0.5 mg total) by mouth 2 (two) times daily as needed. for anxiety 04/03/20  Yes Hawks, Christy A, FNP  amLODipine (NORVASC) 5 MG tablet Take 1 tablet (5 mg total) by mouth daily. 03/09/20  Yes Hawks, Christy A, FNP  Ascorbic Acid (VITAMIN C) 1000 MG tablet Take 1,000 mg by mouth daily.   Yes [provider]  aspirin EC 81 MG tablet Take 1 tablet (81 mg total) by mouth daily. 10/15/18  Yes Herminio Commons, MD  atorvastatin (LIPITOR) 40 MG tablet TAKE 1 TABLET BY MOUTH DAILY AT 6 PM FOR CHOLESTEROL Patient taking differently: Take 40 mg by mouth every evening. TAKE 1 TABLET BY MOUTH DAILY AT 6 PM FOR CHOLESTEROL 03/09/20  Yes Hawks, Christy A, FNP  Budeson-Glycopyrrol-Formoterol (BREZTRI AEROSPHERE) 160-9-4.8 MCG/ACT AERO Inhale 2 puffs into the lungs in the morning and at bedtime. 04/23/20  Yes Hawks, Christy A, FNP  Cholecalciferol (VITAMIN D3) 1.25 MG (50000 UT) CAPS Take 1 capsule by mouth daily.   Yes [provider]  escitalopram (LEXAPRO) 20 MG tablet Take 1 tablet (20 mg total) by mouth daily. 03/09/20  Yes Hawks, Christy A, FNP  furosemide (LASIX) 20 MG tablet Take 1 tablet (20 mg total) by mouth 2 (two) times daily. 03/09/20  Yes Hawks, Christy A, FNP  hydrOXYzine (VISTARIL) 25 MG capsule Take 1 capsule (25 mg total) by mouth 3 (three) times daily as needed for anxiety.  05/11/20  Yes Hawks, Christy A, FNP  ipratropium-albuterol (DUONEB) 0.5-2.5 (3) MG/3ML SOLN USE ONE VIAL BY NEBULIZER FOUR TIMES DAILY Patient taking differently: Inhale 3 mLs into the lungs every 6 (six) hours as needed (asthma). 03/06/20  Yes Hawks, Christy A, FNP  losartan (COZAAR) 100 MG tablet Take 1 tablet (100 mg total) by mouth daily. 03/09/20  Yes Hawks, Christy A, FNP  metoprolol tartrate (LOPRESSOR) 50 MG tablet Take 1 tablet (50 mg total) by mouth 2 (two) times daily. 03/09/20  Yes Hawks, Christy A, FNP  mirtazapine (REMERON) 15 MG tablet Take 1 tablet (15 mg total) by mouth at bedtime. 03/09/20  Yes Hawks, Christy A, FNP  nitroGLYCERIN (NITROSTAT) 0.4 MG SL tablet DISSOLVE TAKE 1 TABLET UNDER  THE TONGUE EVERY FIVE MINUTES AS NEEDED FOR CHEST PAIN Patient taking differently: Place 0.4 mg under the tongue every 5 (five) minutes as needed for chest pain. 05/16/20  Yes Hawks, Christy A, FNP  pantoprazole (PROTONIX) 40 MG tablet Take 1 tablet (40 mg total) by mouth daily. 05/22/20  Yes Hawks, Christy A, FNP  potassium chloride SA (KLOR-CON) 20 MEQ tablet Take 1 tablet (20 mEq total) by mouth 3 (three) times daily. 03/09/20  Yes Hawks, Christy A, FNP  Zinc 100 MG TABS Take 1 tablet by mouth daily.   Yes [provider]  blood glucose meter kit and supplies Dispense based on patient and insurance preference. Use up to four times daily as directed. (FOR ICD-10 E10.9, E11.9). 08/30/19   [name], FNP  Blood Glucose Monitoring Suppl (ACCU-CHEK GUIDE ME) w/Device KIT Check BS daily Dx E11.9 05/16/20   [name] A, FNP  glucose blood (ONE TOUCH ULTRA TEST) test strip USE TO CHECK GLUCOSE ONCE DAILY 02/11/18   Terald Sleeper, PA-C  Lancets [facility] ULTRASOFT) lancets Use as instructed   DX E11.9 05/05/17   [name] A, FNP    Allergies    Gabapentin and Metformin and related  Review of Systems   Review of Systems  Constitutional: Negative for chills, diaphoresis and fever.   Respiratory: Negative for chest tightness and shortness of breath.   Cardiovascular: Positive for chest pain. Negative for palpitations and leg swelling.  Gastrointestinal: Positive for nausea and vomiting. Negative for abdominal pain, constipation and diarrhea.  Musculoskeletal: Negative for back pain and neck pain.  Skin: Negative for rash and wound.  Allergic/Immunologic: Positive for immunocompromised state.  Neurological: Negative for weakness.  All other systems reviewed and are negative.   Physical Exam Updated Vital Signs BP (!) 189/73   Pulse (!) 56   Temp 98.1 F (36.7 C) (Oral)   Resp 19   Ht _0  (1.753 m)   Wt 84.8 kg   SpO2 92%   BMI 27.62 kg/m   Physical Exam Vitals and nursing note reviewed.  Constitutional:      General: He is not in acute distress.    Appearance: He is well-developed and well-nourished. He is not diaphoretic.     Comments: Chronically ill-appearing  HENT:     Head: Normocephalic and atraumatic.  Cardiovascular:     Rate and Rhythm: Normal rate and regular rhythm.     Heart sounds: Normal heart sounds.  Pulmonary:     Effort: Pulmonary effort is normal.     Breath sounds: Normal breath sounds. No decreased breath sounds.     Comments: Post tracheostomy, stenosis, speaks in short sentences which he states is normal for him. Chest:     Chest wall: No tenderness.  Abdominal:     Palpations: Abdomen is soft.     Tenderness: There is no abdominal tenderness.  Musculoskeletal:     Right lower leg: No edema.     Left lower leg: No edema.  Skin:    General: Skin is warm and dry.  Neurological:     Mental Status: He is alert and oriented to person, place, and time.  Psychiatric:        Mood and Affect: Mood and affect normal.        Behavior: Behavior normal.     ED Results / Procedures / Treatments   Labs (all labs ordered are listed, but only abnormal results are displayed) Labs Reviewed  BASIC METABOLIC PANEL -  Abnormal;  Notable for the following components:      Result Value   Glucose, Bld 175 (*)    All other components within normal limits  TROPONIN I (HIGH SENSITIVITY) - Abnormal; Notable for the following components:   Troponin I (High Sensitivity) 33 (*)    All other components within normal limits  TROPONIN I (HIGH SENSITIVITY) - Abnormal; Notable for the following components:   Troponin I (High Sensitivity) 35 (*)    All other components within normal limits  RESP PANEL BY RT-PCR (FLU A&B, COVID) ARPGX2  CBC    EKG EKG Interpretation  Date/Time:  Wednesday May 23 2020 16:24:03 EST Ventricular Rate:  54 PR Interval:  182 QRS Duration: 114 QT Interval:  456 QTC Calculation: 433 R Axis:   76 Text Interpretation: Sinus rhythm Incomplete left bundle branch block Left ventricular hypertrophy No significant change since last tracing No STEMI Confirmed by Nanda Quinton [phone]) on 05/23/2020 4:25:56 PM   Radiology DG Chest 2 View  Result Date: 05/23/2020 CLINICAL DATA:  Chest pain. EXAM: CHEST - 2 VIEW COMPARISON:  04/01/2019. FINDINGS: Subtly increased interstitial opacities in the right perihilar region similar left basilar streaky opacities. No visible pleural effusions or pneumothorax. Biapical pleuroparenchymal scarring. Stable cardiomediastinal silhouette. Aortic atherosclerosis. No acute osseous abnormality. IMPRESSION: Subtly increased interstitial opacities in the right perihilar region which could represent mild asymmetric interstitial edema or atypical infection in the correct clinical setting. Electronically Signed   By: Margaretha Sheffield MD   On: 05/23/2020 15:06    Procedures Procedures (including critical care time)  Medications Ordered in ED Medications  aspirin chewable tablet 324 mg (324 mg Oral Given 05/23/20 1717)    ED Course  I have reviewed the triage vital signs and the nursing notes.  Pertinent labs & imaging results that were available during my care of the  patient were reviewed by me and considered in my medical decision making (see chart for details).  Clinical Course as of 05/23/20 1845  Wed May 24, 3631  5024 74 year old male presents with chest pain as above.  On arrival in the emergency room, pain has resolved.  Patient remains pain free throughout his ED stay.  Initial troponin was elevated at 33, repeat troponin without significant changes 35.  CBC and BMP without significant findings, Covid and flu negative.  EKG without ischemic changes.  Chest x-ray shows subtle increased interstitial opacity in the right perihilar region possibly asymmetric interstitial edema or atypical infection. Patient's heart score is 6, discussed with Dr. Laverta Baltimore, ER attending, plan is to admit for observation.  Discussed results with patient who is agreeable with plan of care. [LM]  1844 Case discussed with Dr. Denton Brick with [facility] who will consult for admission.  [LM]    Clinical Course User Index [LM] Roque Lias   MDM Rules/Calculators/A&P HEAR Score: 6                        Final Clinical Impression(s) / ED Diagnoses Final diagnoses:  Chest pain, unspecified type    Rx / DC Orders ED Discharge Orders    None       Tacy Learn, PA-C 05/23/20 1845    Long, Wonda Old[facility], MD 05/29/20 [phone]

## 2020-05-23 NOTE — H&P (Signed)
History and Physical    Ronald [name]:[id number] DOB: [birth year] DOA: 05/23/2020  PCP: [name], FNP   Patient coming from: Home  I have personally briefly reviewed patient's old medical records in [place]  Chief Complaint: Chest pain  HPI: Ronald [name] is a 74 y.o. male with medical history significant for coronary artery disease history of PCI, COPD with chronic respiratory failure, stroke, CHF, diabetes and hypertension. Patient presented to the ED with complaints of chest pain that started at about 10 AM, sharp midsternal to left-sided with radiation towards his left shoulder. He reports associated vomiting one episodes. No difficulty breathing. No lower extremity swelling. Reports that chest pain is not quite similar to chest pains he had in 2015 when he had his stents placed. Chest pain resolved after 3 of nitro and resting. He reports compliance with his medications ventricular aspirin and statins.  ED Course: Heart rate 50s to 60s, blood pressure systolic 557D to 220U, O2 sats  90%- 95 % on room air.  Troponin 33 > 35, EKG without ST-T wave abnormalities.  Portable chest x-ray right perihilar edema or infection.  Hospitalist to admit for chest pain.  Review of Systems: As per HPI all other systems reviewed and negative.  Past Medical History:  Diagnosis Date  . Anxiety   . Asthma   . Chronic lower back pain   . COPD (chronic obstructive pulmonary disease) ([place])   . COPD (chronic obstructive pulmonary disease) ([place])   . Coronary artery disease   . Depression   . GERD (gastroesophageal reflux disease)   . High cholesterol   . Hypertension   . NSTEMI (non-ST elevated myocardial infarction) ([place]) 05/2014   with stent placement  . Stroke ([place]) 2017   anyeusym   . TIA (transient ischemic attack)    "they say I've had some mini strokes; don't know when"; denies residual on 06/22/2014)  . Type II diabetes mellitus ([place])   . Ulcerative colitis [facility])      Past Surgical History:  Procedure Laterality Date  . APPENDECTOMY    . CARDIAC CATHETERIZATION  1990's X 3  . CHOLECYSTECTOMY    . CORONARY ANGIOPLASTY WITH STENT PLACEMENT  05/2014   "2"  . TUMOR EXCISION Right ~ 1999   "side of my upper head"     reports that he has been smoking cigarettes. He started smoking about 54 years ago. He has a 72.00 pack-year smoking history. He has never used smokeless tobacco. He reports previous alcohol use. He reports that he does not use drugs.  Allergies  Allergen Reactions  . Gabapentin Anxiety    Unknown reaction  . Metformin And Related Rash    Family History  Problem Relation Age of Onset  . CAD Father   . Lung cancer Brother        smoked  . Cancer Brother        lung  . Leukemia Sister   . Dementia Sister   . Stroke Mother   . Emphysema Sister   . Cancer Brother        lung    Prior to Admission medications   Medication Sig Start Date End Date Taking? Authorizing Provider  albuterol (VENTOLIN HFA) 108 (90 Base) MCG/ACT inhaler INHALE 2 PUFFS EVERY 6 HOURS AS NEEEDED Patient taking differently: Inhale 2 puffs into the lungs every 6 (six) hours as needed for wheezing or shortness of breath. 11/02/19  Yes Hawks, Theador Hawthorne, FNP  ALPRAZolam (  XANAX) 0.5 MG tablet Take 1 tablet (0.5 mg total) by mouth 2 (two) times daily as needed. for anxiety 04/03/20  Yes Hawks, Christy A, FNP  amLODipine (NORVASC) 5 MG tablet Take 1 tablet (5 mg total) by mouth daily. 03/09/20  Yes Hawks, Christy A, FNP  Ascorbic Acid (VITAMIN C) 1000 MG tablet Take 1,000 mg by mouth daily.   Yes [provider]  aspirin EC 81 MG tablet Take 1 tablet (81 mg total) by mouth daily. 10/15/18  Yes Herminio Commons, MD  atorvastatin (LIPITOR) 40 MG tablet TAKE 1 TABLET BY MOUTH DAILY AT 6 PM FOR CHOLESTEROL Patient taking differently: Take 40 mg by mouth every evening. TAKE 1 TABLET BY MOUTH DAILY AT 6 PM FOR CHOLESTEROL 03/09/20  Yes Hawks, Christy A, FNP   Budeson-Glycopyrrol-Formoterol (BREZTRI AEROSPHERE) 160-9-4.8 MCG/ACT AERO Inhale 2 puffs into the lungs in the morning and at bedtime. 04/23/20  Yes Hawks, Christy A, FNP  Cholecalciferol (VITAMIN D3) 1.25 MG (50000 UT) CAPS Take 1 capsule by mouth daily.   Yes [provider]  escitalopram (LEXAPRO) 20 MG tablet Take 1 tablet (20 mg total) by mouth daily. 03/09/20  Yes Hawks, Christy A, FNP  furosemide (LASIX) 20 MG tablet Take 1 tablet (20 mg total) by mouth 2 (two) times daily. 03/09/20  Yes Hawks, Christy A, FNP  hydrOXYzine (VISTARIL) 25 MG capsule Take 1 capsule (25 mg total) by mouth 3 (three) times daily as needed for anxiety. 05/11/20  Yes Hawks, Christy A, FNP  ipratropium-albuterol (DUONEB) 0.5-2.5 (3) MG/3ML SOLN USE ONE VIAL BY NEBULIZER FOUR TIMES DAILY Patient taking differently: Inhale 3 mLs into the lungs every 6 (six) hours as needed (asthma). 03/06/20  Yes Hawks, Christy A, FNP  losartan (COZAAR) 100 MG tablet Take 1 tablet (100 mg total) by mouth daily. 03/09/20  Yes Hawks, Christy A, FNP  metoprolol tartrate (LOPRESSOR) 50 MG tablet Take 1 tablet (50 mg total) by mouth 2 (two) times daily. 03/09/20  Yes Hawks, Christy A, FNP  mirtazapine (REMERON) 15 MG tablet Take 1 tablet (15 mg total) by mouth at bedtime. 03/09/20  Yes Hawks, Christy A, FNP  nitroGLYCERIN (NITROSTAT) 0.4 MG SL tablet DISSOLVE TAKE 1 TABLET UNDER THE TONGUE EVERY FIVE MINUTES AS NEEDED FOR CHEST PAIN Patient taking differently: Place 0.4 mg under the tongue every 5 (five) minutes as needed for chest pain. 05/16/20  Yes Hawks, Christy A, FNP  pantoprazole (PROTONIX) 40 MG tablet Take 1 tablet (40 mg total) by mouth daily. 05/22/20  Yes Hawks, Christy A, FNP  potassium chloride SA (KLOR-CON) 20 MEQ tablet Take 1 tablet (20 mEq total) by mouth 3 (three) times daily. 03/09/20  Yes Hawks, Christy A, FNP  Zinc 100 MG TABS Take 1 tablet by mouth daily.   Yes [provider]  blood glucose meter kit and  supplies Dispense based on patient and insurance preference. Use up to four times daily as directed. (FOR ICD-10 E10.9, E11.9). 08/30/19   [name], FNP  Blood Glucose Monitoring Suppl (ACCU-CHEK GUIDE ME) w/Device KIT Check BS daily Dx E11.9 05/16/20   [name] A, FNP  glucose blood (ONE TOUCH ULTRA TEST) test strip USE TO CHECK GLUCOSE ONCE DAILY 02/11/18   Terald Sleeper, PA-C  Lancets [facility] ULTRASOFT) lancets Use as instructed   DX E11.9 05/05/17   [name], FNP    Physical Exam: Vitals:   05/23/20 1700 05/23/20 1730 05/23/20 1800 05/23/20 1830  BP: (!) 147/68 Marland Kitchen)  188/65 (!) 153/67 (!) 189/73  Pulse: (!) 50 (!) 52 (!) 53 (!) 56  Resp: (!) 26 17 (!) 24 19  Temp:      TempSrc:      SpO2: 91% 95% 90% 92%  Weight:      Height:        Constitutional: NAD, calm, comfortable Vitals:   05/23/20 1700 05/23/20 1730 05/23/20 1800 05/23/20 1830  BP: (!) 147/68 (!) 188/65 (!) 153/67 (!) 189/73  Pulse: (!) 50 (!) 52 (!) 53 (!) 56  Resp: (!) 26 17 (!) 24 19  Temp:      TempSrc:      SpO2: 91% 95% 90% 92%  Weight:      Height:       Eyes: PERRL, lids and conjunctivae normal ENMT: Mucous membranes are moist.  Neck: healed scar at site of previous tracheostomy, supple, no masses, no thyromegaly Respiratory: Normal respiratory effort. No accessory muscle use.  Cardiovascular: Regular rate and rhythm,  No extremity edema. 2+ pedal pulses. No carotid bruits.  Abdomen: no tenderness, no masses palpated. No hepatosplenomegaly. Bowel sounds positive.  Musculoskeletal: no clubbing / cyanosis. No joint deformity upper and lower extremities. Good ROM, no contractures. Normal muscle tone.  Skin: no rashes, lesions, ulcers. No induration Neurologic: No apparent cranial abnormality, moving extremities spontaneously. Psychiatric: Normal judgment and insight. Alert and oriented x 3. Normal mood.   Labs on Admission: I have personally reviewed following labs and imaging  studies  CBC: Recent Labs  Lab 05/23/20 1427  WBC 9.9  HGB 14.4  HCT 43.0  MCV 89.6  PLT 144   Basic Metabolic Panel: Recent Labs  Lab 05/23/20 1427  NA 135  K 4.3  CL 98  CO2 30  GLUCOSE 175*  BUN 23  CREATININE 0.91  CALCIUM 9.1    Radiological Exams on Admission: DG Chest 2 View  Result Date: 05/23/2020 CLINICAL DATA:  Chest pain. EXAM: CHEST - 2 VIEW COMPARISON:  04/01/2019. FINDINGS: Subtly increased interstitial opacities in the right perihilar region similar left basilar streaky opacities. No visible pleural effusions or pneumothorax. Biapical pleuroparenchymal scarring. Stable cardiomediastinal silhouette. Aortic atherosclerosis. No acute osseous abnormality. IMPRESSION: Subtly increased interstitial opacities in the right perihilar region which could represent mild asymmetric interstitial edema or atypical infection in the correct clinical setting. Electronically Signed   By: Margaretha Sheffield MD   On: 05/23/2020 15:06    EKG: Independently reviewed.  Sinus rhythm rate 54, QTc 433.  Incomplete left bundle branch block.  LVH.  Assessment/Plan Principal Problem:   Chest pain Active Problems:   CAD in native artery   Diabetes mellitus ([place])   COPD GOLD 3 / still smoking   Tobacco abuse   Chronic respiratory failure with hypoxia (H[facility])   Hypertension associated with diabetes ([place])   CHF (congestive heart failure) ([place])  Chest pain-with CAD history- X 2 DES stents in 2015 at [facility],  Per cardiology notes 10/2018.  Chest pain today typical. EKG and troponins unremarkable so far. Reports compliance with aspirin and statins, both blood pressure uncontrolled. -Trend troponins -EKG in the morning -Obtain echocardiogram -324 mg aspirin given, continue 81 mg daily -Records reviewed -Resume statins, metoprolol 50 twice daily -N.p.o. midnight -Cardiology consult in the morning.  COPD Gold stage III with chronic respiratory failure-stable. With history of  tracheal stenosis. Follows with Dr. Melvyn Novas. He is on O2 at night , he is not sure, may be 4 to 5 L. -Resume home bronchodilators, duo  nebs as needed  Diabetes mellitus-random glucose 175.  No antihyperglycemic medications on home medication list. - SSi- S - Hgba1c  Hypertension-Elevated. Despite reported compliance with home medications. -Resume Norvasc, Lasix 20 twice daily, losartan 100, metoprolol - PRN hydralazine 52m  Unspecified type congestive heart failure -Resume Lasix 20 twice daily -Updated echocardiogram with chest pain work-up  Tobacco abuse-smokes a pack of cigarettes daily.  DVT prophylaxis: Lovenox Code Status: Full code Family Communication: None at bedside. Disposition Plan:  1 - 2 days Consults called: Cardiology Admission status: Obs telemetry  EBethena RoysMD [facility]  05/23/2020, 8:08 PM

## 2020-05-23 NOTE — ED Triage Notes (Signed)
Pt c/o chest pain that began at 1000 this morning. Pain radiates to his left shoulder blade.  Pt has a HX of stent placement.

## 2020-05-24 ENCOUNTER — Observation Stay (HOSPITAL_BASED_OUTPATIENT_CLINIC_OR_DEPARTMENT_OTHER): Payer: Medicare HMO

## 2020-05-24 ENCOUNTER — Encounter (HOSPITAL_COMMUNITY): Payer: Self-pay | Admitting: Internal Medicine

## 2020-05-24 DIAGNOSIS — J398 Other specified diseases of upper respiratory tract: Secondary | ICD-10-CM

## 2020-05-24 DIAGNOSIS — I340 Nonrheumatic mitral (valve) insufficiency: Secondary | ICD-10-CM

## 2020-05-24 DIAGNOSIS — J449 Chronic obstructive pulmonary disease, unspecified: Secondary | ICD-10-CM | POA: Diagnosis not present

## 2020-05-24 DIAGNOSIS — J9611 Chronic respiratory failure with hypoxia: Secondary | ICD-10-CM | POA: Diagnosis not present

## 2020-05-24 DIAGNOSIS — I10 Essential (primary) hypertension: Secondary | ICD-10-CM | POA: Diagnosis not present

## 2020-05-24 DIAGNOSIS — I152 Hypertension secondary to endocrine disorders: Secondary | ICD-10-CM | POA: Diagnosis not present

## 2020-05-24 DIAGNOSIS — Z720 Tobacco use: Secondary | ICD-10-CM | POA: Diagnosis not present

## 2020-05-24 DIAGNOSIS — E1159 Type 2 diabetes mellitus with other circulatory complications: Secondary | ICD-10-CM | POA: Diagnosis not present

## 2020-05-24 DIAGNOSIS — R079 Chest pain, unspecified: Secondary | ICD-10-CM

## 2020-05-24 LAB — ECHOCARDIOGRAM COMPLETE
Area-P 1/2: 3.53 cm2
Height: 69 in
S' Lateral: 2.35 cm
Weight: 2992.01 oz

## 2020-05-24 LAB — HEMOGLOBIN A1C
Hgb A1c MFr Bld: 6.3 % — ABNORMAL HIGH (ref 4.8–5.6)
Mean Plasma Glucose: 134.11 mg/dL

## 2020-05-24 LAB — GLUCOSE, CAPILLARY: Glucose-Capillary: 167 mg/dL — ABNORMAL HIGH (ref 70–99)

## 2020-05-24 LAB — BRAIN NATRIURETIC PEPTIDE: B Natriuretic Peptide: 157 pg/mL — ABNORMAL HIGH (ref 0.0–100.0)

## 2020-05-24 LAB — CBG MONITORING, ED
Glucose-Capillary: 129 mg/dL — ABNORMAL HIGH (ref 70–99)
Glucose-Capillary: 124 mg/dL — ABNORMAL HIGH (ref 70–99)
Glucose-Capillary: 130 mg/dL — ABNORMAL HIGH (ref 70–99)

## 2020-05-24 MED ORDER — HYDRALAZINE HCL 20 MG/ML IJ SOLN: 10.0000 mg | Freq: Four times a day (QID) | INTRAMUSCULAR | Status: DC | PRN

## 2020-05-24 MED ORDER — METOPROLOL TARTRATE 25 MG PO TABS
25.0000 mg | ORAL_TABLET | Freq: Two times a day (BID) | ORAL | Status: DC
  Administered 2020-05-24 – 2020-05-26 (×5): 25 mg via ORAL
  Filled 2020-05-24 (×5): qty 1

## 2020-05-24 MED ORDER — IPRATROPIUM-ALBUTEROL 0.5-2.5 (3) MG/3ML IN SOLN
3.0000 mL | Freq: Three times a day (TID) | RESPIRATORY_TRACT | Status: DC
  Administered 2020-05-25 – 2020-05-26 (×4): 3 mL via RESPIRATORY_TRACT
  Filled 2020-05-24 (×4): qty 3

## 2020-05-24 MED ORDER — IPRATROPIUM-ALBUTEROL 0.5-2.5 (3) MG/3ML IN SOLN
3.0000 mL | Freq: Three times a day (TID) | RESPIRATORY_TRACT | Status: DC
  Administered 2020-05-24 (×2): 3 mL via RESPIRATORY_TRACT
  Filled 2020-05-24 (×2): qty 3

## 2020-05-24 NOTE — Progress Notes (Signed)
   05/24/20 2100  Vitals  Temp 98 F (36.7 C)  Temp Source Oral  BP (!) 176/78  MAP (mmHg) 106  BP Location Left Arm  BP Method Automatic  Patient Position (if appropriate) Sitting  Pulse Rate 73  Pulse Rate Source Dinamap  Resp 20  Level of Consciousness  Level of Consciousness Alert  MEWS COLOR  MEWS Score Color Green  Oxygen Therapy  SpO2 96 %  O2 Device Room Air  MEWS Score  MEWS Temp 0  MEWS Systolic 0  MEWS Pulse 0  MEWS RR 0  MEWS LOC 0  MEWS Score 0   The patient is admitted to level 300 with the diagnosis of chest pain. A & O x 4. Denied any acute pain at this time. The patient is oriented to his room, call bell and staff. Bed alarm activated.Patient voiced no concern. Will continue to monitor.
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
## 2020-05-24 NOTE — Progress Notes (Signed)
*  PRELIMINARY RESULTS* Echocardiogram 2D Echocardiogram has been performed.  Leavy Cella 05/24/2020, 12:26 PM
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
## 2020-05-24 NOTE — Care Management Obs Status (Signed)
[place] NOTIFICATION   Patient Details  Name: Ronald [name] MRN: [id number] Date of Birth: [birth year]   Medicare Observation Status Notification Given:  Yes    Iona Beard, Latanya Presser 05/24/2020, 7:43 PM

## 2020-05-24 NOTE — Plan of Care (Signed)

## 2020-05-25 ENCOUNTER — Encounter (HOSPITAL_COMMUNITY)
Admission: EM | Disposition: A | Discharge status: Home / Self Care | Payer: Self-pay | Source: Home / Self Care | Attending: Emergency Medicine

## 2020-05-25 ENCOUNTER — Encounter (HOSPITAL_COMMUNITY): Payer: Self-pay | Admitting: Internal Medicine

## 2020-05-25 DIAGNOSIS — Z7982 Long term (current) use of aspirin: Secondary | ICD-10-CM | POA: Diagnosis not present

## 2020-05-25 DIAGNOSIS — J398 Other specified diseases of upper respiratory tract: Secondary | ICD-10-CM | POA: Diagnosis not present

## 2020-05-25 DIAGNOSIS — I152 Hypertension secondary to endocrine disorders: Secondary | ICD-10-CM | POA: Diagnosis not present

## 2020-05-25 DIAGNOSIS — I2510 Atherosclerotic heart disease of native coronary artery without angina pectoris: Secondary | ICD-10-CM | POA: Diagnosis not present

## 2020-05-25 DIAGNOSIS — J9611 Chronic respiratory failure with hypoxia: Secondary | ICD-10-CM | POA: Diagnosis not present

## 2020-05-25 DIAGNOSIS — J45909 Unspecified asthma, uncomplicated: Secondary | ICD-10-CM | POA: Diagnosis not present

## 2020-05-25 DIAGNOSIS — E1159 Type 2 diabetes mellitus with other circulatory complications: Secondary | ICD-10-CM

## 2020-05-25 DIAGNOSIS — Z720 Tobacco use: Secondary | ICD-10-CM | POA: Diagnosis not present

## 2020-05-25 DIAGNOSIS — R69 Illness, unspecified: Secondary | ICD-10-CM | POA: Diagnosis not present

## 2020-05-25 DIAGNOSIS — E119 Type 2 diabetes mellitus without complications: Secondary | ICD-10-CM | POA: Diagnosis not present

## 2020-05-25 DIAGNOSIS — I110 Hypertensive heart disease with heart failure: Secondary | ICD-10-CM | POA: Diagnosis not present

## 2020-05-25 DIAGNOSIS — Z20822 Contact with and (suspected) exposure to covid-19: Secondary | ICD-10-CM | POA: Diagnosis not present

## 2020-05-25 DIAGNOSIS — J449 Chronic obstructive pulmonary disease, unspecified: Secondary | ICD-10-CM | POA: Diagnosis not present

## 2020-05-25 DIAGNOSIS — I509 Heart failure, unspecified: Secondary | ICD-10-CM | POA: Diagnosis not present

## 2020-05-25 DIAGNOSIS — R079 Chest pain, unspecified: Secondary | ICD-10-CM | POA: Diagnosis not present

## 2020-05-25 DIAGNOSIS — Z794 Long term (current) use of insulin: Secondary | ICD-10-CM | POA: Diagnosis not present

## 2020-05-25 DIAGNOSIS — I10 Essential (primary) hypertension: Secondary | ICD-10-CM | POA: Diagnosis not present

## 2020-05-25 HISTORY — PX: LEFT HEART CATH AND CORONARY ANGIOGRAPHY: CATH118249

## 2020-05-25 LAB — LIPID PANEL
Cholesterol: 100 mg/dL (ref 0–200)
HDL: 26 mg/dL — ABNORMAL LOW (ref >40)
LDL Cholesterol: 45 mg/dL (ref 0–99)
Total CHOL/HDL Ratio: 3.8 RATIO
Triglycerides: 143 mg/dL (ref <150)
VLDL: 29 mg/dL (ref 0–40)

## 2020-05-25 LAB — BASIC METABOLIC PANEL
Anion gap: 8 (ref 5–15)
BUN: 30 mg/dL — ABNORMAL HIGH (ref 8–23)
CO2: 29 mmol/L (ref 22–32)
Calcium: 8.5 mg/dL — ABNORMAL LOW (ref 8.9–10.3)
Chloride: 98 mmol/L (ref 98–111)
Creatinine, Ser: 1.14 mg/dL (ref 0.61–1.24)
GFR, Estimated: >60 mL/min (ref >60)
Glucose, Bld: 133 mg/dL — ABNORMAL HIGH (ref 70–99)
Potassium: 3.4 mmol/L — ABNORMAL LOW (ref 3.5–5.1)
Sodium: 135 mmol/L (ref 135–145)

## 2020-05-25 LAB — HEPATIC FUNCTION PANEL
ALT: 19 U/L (ref 0–44)
AST: 19 U/L (ref 15–41)
Albumin: 3.8 g/dL (ref 3.5–5.0)
Alkaline Phosphatase: 89 U/L (ref 38–126)
Bilirubin, Direct: 0.2 mg/dL (ref 0.0–0.2)
Indirect Bilirubin: 1.2 mg/dL — ABNORMAL HIGH (ref 0.3–0.9)
Total Bilirubin: 1.4 mg/dL — ABNORMAL HIGH (ref 0.3–1.2)
Total Protein: 7 g/dL (ref 6.5–8.1)

## 2020-05-25 LAB — CBC
HCT: 43.1 % (ref 39.0–52.0)
HCT: 46.2 % (ref 39.0–52.0)
Hemoglobin: 14 g/dL (ref 13.0–17.0)
Hemoglobin: 15.4 g/dL (ref 13.0–17.0)
MCH: 29.2 pg (ref 26.0–34.0)
MCH: 29 pg (ref 26.0–34.0)
MCHC: 32.5 g/dL (ref 30.0–36.0)
MCHC: 33.3 g/dL (ref 30.0–36.0)
MCV: 90 fL (ref 80.0–100.0)
MCV: 87 fL (ref 80.0–100.0)
Platelets: 342 10*3/uL (ref 150–400)
Platelets: 428 10*3/uL — ABNORMAL HIGH (ref 150–400)
RBC: 4.79 MIL/uL (ref 4.22–5.81)
RBC: 5.31 MIL/uL (ref 4.22–5.81)
RDW: 12.9 % (ref 11.5–15.5)
RDW: 12.9 % (ref 11.5–15.5)
WBC: 9.2 10*3/uL (ref 4.0–10.5)
WBC: 11 10*3/uL — ABNORMAL HIGH (ref 4.0–10.5)
nRBC: 0 % (ref 0.0–0.2)
nRBC: 0 % (ref 0.0–0.2)

## 2020-05-25 LAB — GLUCOSE, CAPILLARY
Glucose-Capillary: 116 mg/dL — ABNORMAL HIGH (ref 70–99)
Glucose-Capillary: 121 mg/dL — ABNORMAL HIGH (ref 70–99)
Glucose-Capillary: 121 mg/dL — ABNORMAL HIGH (ref 70–99)
Glucose-Capillary: 206 mg/dL — ABNORMAL HIGH (ref 70–99)
Glucose-Capillary: 102 mg/dL — ABNORMAL HIGH (ref 70–99)

## 2020-05-25 LAB — TROPONIN I (HIGH SENSITIVITY): Troponin I (High Sensitivity): 49 ng/L — ABNORMAL HIGH (ref <18)

## 2020-05-25 LAB — TSH: TSH: 1.097 u[IU]/mL (ref 0.350–4.500)

## 2020-05-25 LAB — CREATININE, SERUM
Creatinine, Ser: 1.19 mg/dL (ref 0.61–1.24)
GFR, Estimated: >60 mL/min (ref >60)

## 2020-05-25 SURGERY — LEFT HEART CATH AND CORONARY ANGIOGRAPHY
Anesthesia: LOCAL

## 2020-05-25 MED ORDER — SODIUM CHLORIDE 0.9 % IV SOLN: 250.0000 mL | INTRAVENOUS | Status: DC | PRN

## 2020-05-25 MED ORDER — HEPARIN (PORCINE) IN NACL 1000-0.9 UT/500ML-% IV SOLN
INTRAVENOUS | Status: AC
  Filled 2020-05-25: qty 1000

## 2020-05-25 MED ORDER — LIDOCAINE HCL (PF) 1 % IJ SOLN
INTRAMUSCULAR | Status: AC
  Filled 2020-05-25: qty 30

## 2020-05-25 MED ORDER — ENOXAPARIN SODIUM 40 MG/0.4ML ~~LOC~~ SOLN
40.0000 mg | SUBCUTANEOUS | Status: DC
  Administered 2020-05-26: 40 mg via SUBCUTANEOUS
  Filled 2020-05-25: qty 0.4

## 2020-05-25 MED ORDER — SODIUM CHLORIDE 0.9% FLUSH: 3.0000 mL | INTRAVENOUS | Status: DC | PRN

## 2020-05-25 MED ORDER — HEPARIN (PORCINE) IN NACL 2000-0.9 UNIT/L-% IV SOLN
INTRAVENOUS | Status: AC
  Filled 2020-05-25: qty 1000

## 2020-05-25 MED ORDER — SODIUM CHLORIDE 0.9 % WEIGHT BASED INFUSION
3.0000 mL/kg/h | INTRAVENOUS | Status: DC
Start: 2020-05-25 — End: 2020-05-25

## 2020-05-25 MED ORDER — MIDAZOLAM HCL 2 MG/2ML IJ SOLN
INTRAMUSCULAR | Status: DC | PRN
  Administered 2020-05-25: 1 mg via INTRAVENOUS

## 2020-05-25 MED ORDER — FENTANYL CITRATE (PF) 100 MCG/2ML IJ SOLN
INTRAMUSCULAR | Status: AC
  Filled 2020-05-25: qty 2

## 2020-05-25 MED ORDER — LIDOCAINE HCL (PF) 1 % IJ SOLN
INTRAMUSCULAR | Status: DC | PRN
  Administered 2020-05-25: 2 mL

## 2020-05-25 MED ORDER — IOHEXOL 350 MG/ML SOLN
INTRAVENOUS | Status: DC | PRN
  Administered 2020-05-25: 80 mL

## 2020-05-25 MED ORDER — VERAPAMIL HCL 2.5 MG/ML IV SOLN
INTRAVENOUS | Status: AC
  Filled 2020-05-25: qty 2

## 2020-05-25 MED ORDER — HEPARIN (PORCINE) IN NACL 1000-0.9 UT/500ML-% IV SOLN
INTRAVENOUS | Status: DC | PRN
  Administered 2020-05-25 (×3): 500 mL

## 2020-05-25 MED ORDER — ASPIRIN 81 MG PO CHEW
81.0000 mg | CHEWABLE_TABLET | ORAL | Status: AC
  Administered 2020-05-25: 81 mg via ORAL
  Filled 2020-05-25: qty 1

## 2020-05-25 MED ORDER — MIDAZOLAM HCL 2 MG/2ML IJ SOLN
INTRAMUSCULAR | Status: AC
  Filled 2020-05-25: qty 2

## 2020-05-25 MED ORDER — SODIUM CHLORIDE 0.9% FLUSH
3.0000 mL | Freq: Two times a day (BID) | INTRAVENOUS | Status: DC
  Administered 2020-05-26 (×2): 3 mL via INTRAVENOUS

## 2020-05-25 MED ORDER — SODIUM CHLORIDE 0.9 % WEIGHT BASED INFUSION
1.0000 mL/kg/h | INTRAVENOUS | Status: DC
  Administered 2020-05-25: 1 mL/kg/h via INTRAVENOUS

## 2020-05-25 MED ORDER — SODIUM CHLORIDE 0.9% FLUSH
3.0000 mL | Freq: Two times a day (BID) | INTRAVENOUS | Status: DC
Start: 2020-05-25 — End: 2020-05-26
  Administered 2020-05-25 – 2020-05-26 (×2): 3 mL via INTRAVENOUS

## 2020-05-25 MED ORDER — ISOSORBIDE MONONITRATE ER 30 MG PO TB24
30.0000 mg | ORAL_TABLET | Freq: Every day | ORAL | Status: DC
  Administered 2020-05-25 – 2020-05-26 (×2): 30 mg via ORAL
  Filled 2020-05-25 (×2): qty 1

## 2020-05-25 MED ORDER — VERAPAMIL HCL 2.5 MG/ML IV SOLN
INTRAVENOUS | Status: DC | PRN
  Administered 2020-05-25: 10 mL via INTRA_ARTERIAL

## 2020-05-25 MED ORDER — FENTANYL CITRATE (PF) 100 MCG/2ML IJ SOLN
INTRAMUSCULAR | Status: DC | PRN
  Administered 2020-05-25: 25 ug via INTRAVENOUS

## 2020-05-25 MED ORDER — HEPARIN SODIUM (PORCINE) 1000 UNIT/ML IJ SOLN
INTRAMUSCULAR | Status: DC | PRN
  Administered 2020-05-25: 4000 [IU] via INTRAVENOUS

## 2020-05-25 MED ORDER — SODIUM CHLORIDE 0.9 % WEIGHT BASED INFUSION
1.0000 mL/kg/h | INTRAVENOUS | Status: AC
  Administered 2020-05-25: 1 mL/kg/h via INTRAVENOUS

## 2020-05-25 SURGICAL SUPPLY — 11 items
CATH 5FR JL3.5 JR4 ANG PIG MP (CATHETERS) ×1 IMPLANT
CATH EXPO 5F MPA-1 (CATHETERS) ×1 IMPLANT
DEVICE RAD COMP TR BAND LRG (VASCULAR PRODUCTS) ×1 IMPLANT
GLIDESHEATH SLEND SS 6F .021 (SHEATH) ×1 IMPLANT
GUIDEWIRE INQWIRE 1.5J.035X260 (WIRE) IMPLANT
INQWIRE 1.5J .035X260CM (WIRE) ×2
KIT HEART LEFT (KITS) ×2 IMPLANT
PACK CARDIAC CATHETERIZATION (CUSTOM PROCEDURE TRAY) ×2 IMPLANT
SHEATH PROBE COVER 6X72 (BAG) ×1 IMPLANT
TRANSDUCER W/STOPCOCK (MISCELLANEOUS) ×2 IMPLANT
TUBING CIL FLEX 10 FLL-RA (TUBING) ×2 IMPLANT

## 2020-05-25 NOTE — Interval H&P Note (Signed)
History and Physical Interval Note:  05/25/2020 2:22 PM  Ronald [name]  has presented today for surgery, with the diagnosis of unstable angin.  The various methods of treatment have been discussed with the patient and family. After consideration of risks, benefits and other options for treatment, the patient has consented to  Procedure(s): LEFT HEART CATH AND CORONARY ANGIOGRAPHY (N/A) as a surgical intervention.  The patient's history has been reviewed, patient examined, no change in status, stable for surgery.  I have reviewed the patient's chart and labs.  Questions were answered to the patient's satisfaction.   Cath Lab Visit (complete for each Cath Lab visit)  Clinical Evaluation Leading to the Procedure:   ACS: Yes.    Non-ACS:    Anginal Classification: CCS III  Anti-ischemic medical therapy: Maximal Therapy (2 or more classes of medications)  Non-Invasive Test Results: No non-invasive testing performed  Prior CABG: No previous CABG        Ronald [name] [facility] 05/25/2020 2:22 PM

## 2020-05-25 NOTE — Progress Notes (Signed)
Denies chest pain but c/o posterior neck pain.  Given tylenol.  Waiting for transfer to [facility] for heart cath.
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
## 2020-05-26 DIAGNOSIS — R079 Chest pain, unspecified: Secondary | ICD-10-CM | POA: Diagnosis not present

## 2020-05-26 DIAGNOSIS — E1159 Type 2 diabetes mellitus with other circulatory complications: Secondary | ICD-10-CM | POA: Diagnosis not present

## 2020-05-26 DIAGNOSIS — J9611 Chronic respiratory failure with hypoxia: Secondary | ICD-10-CM | POA: Diagnosis not present

## 2020-05-26 DIAGNOSIS — I10 Essential (primary) hypertension: Secondary | ICD-10-CM

## 2020-05-26 DIAGNOSIS — I152 Hypertension secondary to endocrine disorders: Secondary | ICD-10-CM | POA: Diagnosis not present

## 2020-05-26 DIAGNOSIS — I2510 Atherosclerotic heart disease of native coronary artery without angina pectoris: Secondary | ICD-10-CM | POA: Diagnosis not present

## 2020-05-26 LAB — BASIC METABOLIC PANEL
Anion gap: 8 (ref 5–15)
BUN: 28 mg/dL — ABNORMAL HIGH (ref 8–23)
CO2: 26 mmol/L (ref 22–32)
Calcium: 8.4 mg/dL — ABNORMAL LOW (ref 8.9–10.3)
Chloride: 104 mmol/L (ref 98–111)
Creatinine, Ser: 1.08 mg/dL (ref 0.61–1.24)
GFR, Estimated: >60 mL/min (ref >60)
Glucose, Bld: 100 mg/dL — ABNORMAL HIGH (ref 70–99)
Potassium: 3.5 mmol/L (ref 3.5–5.1)
Sodium: 138 mmol/L (ref 135–145)

## 2020-05-26 LAB — CBC
HCT: 39.5 % (ref 39.0–52.0)
Hemoglobin: 13.2 g/dL (ref 13.0–17.0)
MCH: 29.5 pg (ref 26.0–34.0)
MCHC: 33.4 g/dL (ref 30.0–36.0)
MCV: 88.2 fL (ref 80.0–100.0)
Platelets: 320 10*3/uL (ref 150–400)
RBC: 4.48 MIL/uL (ref 4.22–5.81)
RDW: 12.8 % (ref 11.5–15.5)
WBC: 10 10*3/uL (ref 4.0–10.5)
nRBC: 0 % (ref 0.0–0.2)

## 2020-05-26 LAB — GLUCOSE, CAPILLARY
Glucose-Capillary: 97 mg/dL (ref 70–99)
Glucose-Capillary: 126 mg/dL — ABNORMAL HIGH (ref 70–99)

## 2020-05-26 MED ORDER — LOSARTAN POTASSIUM 100 MG PO TABS: 50.0000 mg | ORAL_TABLET | Freq: Every day | ORAL | 3 refills | Status: DC

## 2020-05-26 MED ORDER — ISOSORBIDE MONONITRATE ER 30 MG PO TB24: 30.0000 mg | ORAL_TABLET | Freq: Every day | ORAL | 0 refills | Status: DC
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
## 2020-05-26 NOTE — Progress Notes (Signed)
Patient alert and oriented x 4, no oozing from right radial artery access for left heart cath 05/25/20. Patient independent in all ADLs, waiting on family to discharge home.

## 2020-05-26 NOTE — Discharge Instructions (Signed)
Chest Wall Pain Chest wall pain is pain in or around the bones and muscles of your chest. Chest wall pain may be caused by:  An injury.  Coughing a lot.  Using your chest and arm muscles too much. Sometimes, the cause may not be known. This pain may take a few weeks or longer to get better. Follow these instructions at home: Managing pain, stiffness, and swelling If told, put ice on the painful area:  Put ice in a plastic bag.  Place a towel between your skin and the bag.  Leave the ice on for 20 minutes, 2-3 times a day.  Activity  Rest as told by your doctor.  Avoid doing things that cause pain. This includes lifting heavy items.  Ask your doctor what activities are safe for you. General instructions   Take over-the-counter and prescription medicines only as told by your doctor.  Do not use any products that contain nicotine or tobacco, such as cigarettes, e-cigarettes, and chewing tobacco. If you need help quitting, ask your doctor.  Keep all follow-up visits as told by your doctor. This is important. Contact a doctor if:  You have a fever.  Your chest pain gets worse.  You have new symptoms. Get help right away if:  You feel sick to your stomach (nauseous) or you throw up (vomit).  You feel sweaty or light-headed.  You have a cough with mucus from your lungs (sputum) or you cough up blood.  You are short of breath. These symptoms may be an emergency. Do not wait to see if the symptoms will go away. Get medical help right away. Call your local emergency services (911 in the [place] Do not drive yourself to the hospital. Summary  Chest wall pain is pain in or around the bones and muscles of your chest.  It may be treated with ice, rest, and medicines. Your condition may also get better if you avoid doing things that cause pain.  Contact a doctor if you have a fever, chest pain that gets worse, or new symptoms.  Get help right away if you feel light-headed  or you get short of breath. These symptoms may be an emergency. This information is not intended to replace advice given to you by your health care provider. Make sure you discuss any questions you have with your health care provider. Document Revised: 11/26/2017 Document Reviewed: 11/26/2017 Elsevier Patient Education  2020 [employer].   Hypertension, Adult Hypertension is another name for high blood pressure. High blood pressure forces your heart to work harder to pump blood. This can cause problems over time. There are two numbers in a blood pressure reading. There is a top number (systolic) over a bottom number (diastolic). It is best to have a blood pressure that is below 120/80. Healthy choices can help lower your blood pressure, or you may need medicine to help lower it. What are the causes? The cause of this condition is not known. Some conditions may be related to high blood pressure. What increases the risk?  Smoking.  Having type 2 diabetes mellitus, high cholesterol, or both.  Not getting enough exercise or physical activity.  Being overweight.  Having too much fat, sugar, calories, or salt (sodium) in your diet.  Drinking too much alcohol.  Having long-term (chronic) kidney disease.  Having a family history of high blood pressure.  Age. Risk increases with age.  Race. You may be at higher risk if you are African American.  Gender. Men  are at higher risk than women before age 47. After age 13, women are at higher risk than men.  Having obstructive sleep apnea.  Stress. What are the signs or symptoms?  High blood pressure may not cause symptoms. Very high blood pressure (hypertensive crisis) may cause: ? Headache. ? Feelings of worry or nervousness (anxiety). ? Shortness of breath. ? Nosebleed. ? A feeling of being sick to your stomach (nausea). ? Throwing up (vomiting). ? Changes in how you see. ? Very bad chest pain. ? Seizures. How is this  treated?  This condition is treated by making healthy lifestyle changes, such as: ? Eating healthy foods. ? Exercising more. ? Drinking less alcohol.  Your health care provider may prescribe medicine if lifestyle changes are not enough to get your blood pressure under control, and if: ? Your top number is above 130. ? Your bottom number is above 80.  Your personal target blood pressure may vary. Follow these instructions at home: Eating and drinking   If told, follow the DASH eating plan. To follow this plan: ? Fill one half of your plate at each meal with fruits and vegetables. ? Fill one fourth of your plate at each meal with whole grains. Whole grains include whole-wheat pasta, brown rice, and whole-grain bread. ? Eat or drink low-fat dairy products, such as skim milk or low-fat yogurt. ? Fill one fourth of your plate at each meal with low-fat (lean) proteins. Low-fat proteins include fish, chicken without skin, eggs, beans, and tofu. ? Avoid fatty meat, cured and processed meat, or chicken with skin. ? Avoid pre-made or processed food.  Eat less than 1,500 mg of salt each day.  Do not drink alcohol if: ? Your doctor tells you not to drink. ? You are pregnant, may be pregnant, or are planning to become pregnant.  If you drink alcohol: ? Limit how much you use to:  0-1 drink a day for women.  0-2 drinks a day for men. ? Be aware of how much alcohol is in your drink. In the [place] one drink equals one 12 oz bottle of beer (355 mL), one 5 oz glass of wine (148 mL), or one 1 oz glass of hard liquor (44 mL). Lifestyle   Work with your doctor to stay at a healthy weight or to lose weight. Ask your doctor what the best weight is for you.  Get at least 30 minutes of exercise most days of the week. This may include walking, swimming, or biking.  Get at least 30 minutes of exercise that strengthens your muscles (resistance exercise) at least 3 days a week. This may include  lifting weights or doing Pilates.  Do not use any products that contain nicotine or tobacco, such as cigarettes, e-cigarettes, and chewing tobacco. If you need help quitting, ask your doctor.  Check your blood pressure at home as told by your doctor.  Keep all follow-up visits as told by your doctor. This is important. Medicines  Take over-the-counter and prescription medicines only as told by your doctor. Follow directions carefully.  Do not skip doses of blood pressure medicine. The medicine does not work as well if you skip doses. Skipping doses also puts you at risk for problems.  Ask your doctor about side effects or reactions to medicines that you should watch for. Contact a doctor if you:  Think you are having a reaction to the medicine you are taking.  Have headaches that keep coming back (recurring).  Feel  dizzy.  Have swelling in your ankles.  Have trouble with your vision. Get help right away if you:  Get a very bad headache.  Start to feel mixed up (confused).  Feel weak or numb.  Feel faint.  Have very bad pain in your: ? Chest. ? Belly (abdomen).  Throw up more than once.  Have trouble breathing. Summary  Hypertension is another name for high blood pressure.  High blood pressure forces your heart to work harder to pump blood.  For most people, a normal blood pressure is less than 120/80.  Making healthy choices can help lower blood pressure. If your blood pressure does not get lower with healthy choices, you may need to take medicine. This information is not intended to replace advice given to you by your health care provider. Make sure you discuss any questions you have with your health care provider. Document Revised: 02/03/2018 Document Reviewed: 02/03/2018 Elsevier Patient Education  2020 [place] Your Hypertension Hypertension is commonly called high blood pressure. This is when the force of your blood pressing against the  walls of your arteries is too strong. Arteries are blood vessels that carry blood from your heart throughout your body. Hypertension forces the heart to work harder to pump blood, and may cause the arteries to become narrow or stiff. Having untreated or uncontrolled hypertension can cause heart attack, stroke, kidney disease, and other problems. What are blood pressure readings? A blood pressure reading consists of a higher number over a lower number. Ideally, your blood pressure should be below 120/80. The first ("top") number is called the systolic pressure. It is a measure of the pressure in your arteries as your heart beats. The second ("bottom") number is called the diastolic pressure. It is a measure of the pressure in your arteries as the heart relaxes. What does my blood pressure reading mean? Blood pressure is classified into four stages. Based on your blood pressure reading, your health care provider may use the following stages to determine what type of treatment you need, if any. Systolic pressure and diastolic pressure are measured in a unit called mm Hg. Normal  Systolic pressure: below 517.  Diastolic pressure: below 80. Elevated  Systolic pressure: 001-749.  Diastolic pressure: below 80. Hypertension stage 1  Systolic pressure: 449-675.  Diastolic pressure: 91-63. Hypertension stage 2  Systolic pressure: 846 or above.  Diastolic pressure: 90 or above. What health risks are associated with hypertension? Managing your hypertension is an important responsibility. Uncontrolled hypertension can lead to:  A heart attack.  A stroke.  A weakened blood vessel (aneurysm).  Heart failure.  Kidney damage.  Eye damage.  Metabolic syndrome.  Memory and concentration problems. What changes can I make to manage my hypertension? Hypertension can be managed by making lifestyle changes and possibly by taking medicines. Your health care provider will help you make a plan to  bring your blood pressure within a normal range. Eating and drinking   Eat a diet that is high in fiber and potassium, and low in salt (sodium), added sugar, and fat. An example eating plan is called the DASH (Dietary Approaches to Stop Hypertension) diet. To eat this way: ? Eat plenty of fresh fruits and vegetables. Try to fill half of your plate at each meal with fruits and vegetables. ? Eat whole grains, such as whole wheat pasta, brown rice, or whole grain bread. Fill about one quarter of your plate with whole grains. ? Eat low-fat diary products. ?  Avoid fatty cuts of meat, processed or cured meats, and poultry with skin. Fill about one quarter of your plate with lean proteins such as fish, chicken without skin, beans, eggs, and tofu. ? Avoid premade and processed foods. These tend to be higher in sodium, added sugar, and fat.  Reduce your daily sodium intake. Most people with hypertension should eat less than 1,500 mg of sodium a day.  Limit alcohol intake to no more than 1 drink a day for nonpregnant women and 2 drinks a day for men. One drink equals 12 oz of beer, 5 oz of wine, or 1 oz of hard liquor. Lifestyle  Work with your health care provider to maintain a healthy body weight, or to lose weight. Ask what an ideal weight is for you.  Get at least 30 minutes of exercise that causes your heart to beat faster (aerobic exercise) most days of the week. Activities may include walking, swimming, or biking.  Include exercise to strengthen your muscles (resistance exercise), such as weight lifting, as part of your weekly exercise routine. Try to do these types of exercises for 30 minutes at least 3 days a week.  Do not use any products that contain nicotine or tobacco, such as cigarettes and e-cigarettes. If you need help quitting, ask your health care provider.  Control any long-term (chronic) conditions you have, such as high cholesterol or diabetes. Monitoring  Monitor your blood  pressure at home as told by your health care provider. Your personal target blood pressure may vary depending on your medical conditions, your age, and other factors.  Have your blood pressure checked regularly, as often as told by your health care provider. Working with your health care provider  Review all the medicines you take with your health care provider because there may be side effects or interactions.  Talk with your health care provider about your diet, exercise habits, and other lifestyle factors that may be contributing to hypertension.  Visit your health care provider regularly. Your health care provider can help you create and adjust your plan for managing hypertension. Will I need medicine to control my blood pressure? Your health care provider may prescribe medicine if lifestyle changes are not enough to get your blood pressure under control, and if:  Your systolic blood pressure is 130 or higher.  Your diastolic blood pressure is 80 or higher. Take medicines only as told by your health care provider. Follow the directions carefully. Blood pressure medicines must be taken as prescribed. The medicine does not work as well when you skip doses. Skipping doses also puts you at risk for problems. Contact a health care provider if:  You think you are having a reaction to medicines you have taken.  You have repeated (recurrent) headaches.  You feel dizzy.  You have swelling in your ankles.  You have trouble with your vision. Get help right away if:  You develop a severe headache or confusion.  You have unusual weakness or numbness, or you feel faint.  You have severe pain in your chest or abdomen.  You vomit repeatedly.  You have trouble breathing. Summary  Hypertension is when the force of blood pumping through your arteries is too strong. If this condition is not controlled, it may put you at risk for serious complications.  Your personal target blood pressure  may vary depending on your medical conditions, your age, and other factors. For most people, a normal blood pressure is less than 120/80.  Hypertension is  managed by lifestyle changes, medicines, or both. Lifestyle changes include weight loss, eating a healthy, low-sodium diet, exercising more, and limiting alcohol. This information is not intended to replace advice given to you by your health care provider. Make sure you discuss any questions you have with your health care provider. Document Revised: 09/17/2018 Document Reviewed: 04/23/2016 Elsevier Patient Education  [place].

## 2020-05-28 ENCOUNTER — Encounter (HOSPITAL_COMMUNITY): Payer: Medicare HMO

## 2020-05-28 ENCOUNTER — Telehealth: Payer: Self-pay

## 2020-05-28 ENCOUNTER — Encounter (HOSPITAL_COMMUNITY): Payer: Self-pay | Admitting: Cardiology

## 2020-05-28 NOTE — Telephone Encounter (Signed)
Contact Date: 05/28/2020 Contacted By: Felicity Coyer, LPN  Transition Care Management Follow-up Telephone Call  Date of discharge and from where: 05/26/2020 [facility]  Discharge Diagnosis:  Chest pain  How have you been since you were released from the hospital? Patient reports they are doing well.  Any questions or concerns? No   Items Reviewed:  Did the pt receive and understand the discharge instructions provided? Yes   Medications obtained and verified? Yes   Any new allergies since your discharge? No   Dietary orders reviewed? Yes  Do you have support at home? Yes   Discontinued Medications None New Medications Added Losartan 100 mg 1/2 tablet per day, Metoprolol 50 mg twice a day  Current Medication List Allergies as of 05/28/2020      Reactions   Gabapentin Anxiety   Unknown reaction   Metformin And Related Rash      Medication List       Accurate as of May 28, 2020 10:48 AM. If you have any questions, ask your nurse or doctor.        Accu-Chek Guide Me w/Device Kit Check BS daily Dx E11.9   albuterol 108 (90 Base) MCG/ACT inhaler Commonly known as: VENTOLIN HFA INHALE 2 PUFFS EVERY 6 HOURS AS NEEEDED What changed: See the new instructions.   ALPRAZolam 0.5 MG tablet Commonly known as: XANAX Take 1 tablet (0.5 mg total) by mouth 2 (two) times daily as needed. for anxiety   amLODipine 5 MG tablet Commonly known as: NORVASC Take 1 tablet (5 mg total) by mouth daily.   aspirin EC 81 MG tablet Take 1 tablet (81 mg total) by mouth daily.   atorvastatin 40 MG tablet Commonly known as: LIPITOR TAKE 1 TABLET BY MOUTH DAILY AT 6 PM FOR CHOLESTEROL What changed:   how much to take  how to take this  when to take this   Breztri Aerosphere 160-9-4.8 MCG/ACT Aero Generic drug: Budeson-Glycopyrrol-Formoterol Inhale 2 puffs into the lungs in the morning and at bedtime.   escitalopram 20 MG tablet Commonly known as: Lexapro Take 1  tablet (20 mg total) by mouth daily.   furosemide 20 MG tablet Commonly known as: LASIX Take 1 tablet (20 mg total) by mouth 2 (two) times daily.   glucose blood test strip Commonly known as: ONE TOUCH ULTRA TEST USE TO CHECK GLUCOSE ONCE DAILY   hydrOXYzine 25 MG capsule Commonly known as: Vistaril Take 1 capsule (25 mg total) by mouth 3 (three) times daily as needed for anxiety.   ipratropium-albuterol 0.5-2.5 (3) MG/3ML Soln Commonly known as: DUONEB USE ONE VIAL BY NEBULIZER FOUR TIMES DAILY What changed:   how much to take  how to take this  when to take this  reasons to take this  additional instructions   isosorbide mononitrate 30 MG 24 hr tablet Commonly known as: IMDUR Take 1 tablet (30 mg total) by mouth daily.   losartan 100 MG tablet Commonly known as: COZAAR Take 0.5 tablets (50 mg total) by mouth daily.   metoprolol tartrate 50 MG tablet Commonly known as: LOPRESSOR Take 1 tablet (50 mg total) by mouth 2 (two) times daily.   mirtazapine 15 MG tablet Commonly known as: REMERON Take 1 tablet (15 mg total) by mouth at bedtime.   nitroGLYCERIN 0.4 MG SL tablet Commonly known as: NITROSTAT DISSOLVE TAKE 1 TABLET UNDER THE TONGUE EVERY FIVE MINUTES AS NEEDED FOR CHEST PAIN What changed:   how much to take  how to  take this  when to take this  reasons to take this  additional instructions   onetouch ultrasoft lancets Use as instructed   DX E11.9   pantoprazole 40 MG tablet Commonly known as: PROTONIX Take 1 tablet (40 mg total) by mouth daily.   potassium chloride SA 20 MEQ tablet Commonly known as: KLOR-CON Take 1 tablet (20 mEq total) by mouth 3 (three) times daily.   vitamin C 1000 MG tablet Take 1,000 mg by mouth daily.   Vitamin D3 1.25 MG (50000 UT) Caps Take 1 capsule by mouth daily.   Zinc 100 MG Tabs Take 1 tablet by mouth daily.        Home Care and Equipment/Supplies: Were home health services ordered? no If so,  what is the name of the agency? Not applicable  Has the agency set up a time to come to the patient's home? not applicable Were any new equipment or medical supplies ordered?  No What is the name of the medical supply agency? Not applicable Were you able to get the supplies/equipment? not applicable Do you have any questions related to the use of the equipment or supplies? No  Functional Questionnaire: (I = Independent and D = Dependent) ADLs: I  Bathing/Dressing-  I  Meal Prep- I  Eating- I  Maintaining continence- I  Transferring/Ambulation- I  Managing Meds- I  Follow up appointments reviewed:   PCP Hospital f/u appt confirmed? Yes  Scheduled to see [name] on 06/14/2020 at 3:55 pm.  [facility] f/u appt confirmed? Not applicable  Are transportation arrangements needed? No   If their condition worsens, is the pt aware to call PCP or go to the Emergency Dept.? Yes  Was the patient provided with contact information for the PCP's office or ED? Yes  Was to pt encouraged to call back with questions or concerns? Yes

## 2020-05-30 ENCOUNTER — Telehealth: Payer: Self-pay | Admitting: Pharmacist

## 2020-05-30 DIAGNOSIS — E785 Hyperlipidemia, unspecified: Secondary | ICD-10-CM

## 2020-05-30 MED ORDER — ATORVASTATIN CALCIUM 40 MG PO TABS
40.0000 mg | ORAL_TABLET | Freq: Every evening | ORAL | 1 refills | Status: DC
Start: 2020-05-30 — End: 2020-12-05
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
## 2020-05-30 NOTE — Telephone Encounter (Signed)
Statin refill needed - RX sent to [facility]

## 2020-05-31 ENCOUNTER — Encounter: Payer: Self-pay | Admitting: Family Medicine

## 2020-05-31 ENCOUNTER — Ambulatory Visit (INDEPENDENT_AMBULATORY_CARE_PROVIDER_SITE_OTHER): Payer: Medicare HMO | Admitting: Family Medicine

## 2020-05-31 VITALS — BP 126/60 | HR 70 | Ht 69.0 in | Wt 181.6 lb

## 2020-05-31 DIAGNOSIS — I10 Essential (primary) hypertension: Secondary | ICD-10-CM | POA: Diagnosis not present

## 2020-05-31 DIAGNOSIS — Z720 Tobacco use: Secondary | ICD-10-CM | POA: Diagnosis not present

## 2020-05-31 DIAGNOSIS — Z01818 Encounter for other preprocedural examination: Secondary | ICD-10-CM | POA: Diagnosis not present

## 2020-05-31 DIAGNOSIS — E782 Mixed hyperlipidemia: Secondary | ICD-10-CM | POA: Diagnosis not present

## 2020-05-31 DIAGNOSIS — I2510 Atherosclerotic heart disease of native coronary artery without angina pectoris: Secondary | ICD-10-CM | POA: Diagnosis not present

## 2020-05-31 NOTE — Patient Instructions (Addendum)

## 2020-06-05 DIAGNOSIS — J439 Emphysema, unspecified: Secondary | ICD-10-CM | POA: Diagnosis not present

## 2020-06-07 ENCOUNTER — Other Ambulatory Visit: Payer: Self-pay

## 2020-06-07 ENCOUNTER — Ambulatory Visit (INDEPENDENT_AMBULATORY_CARE_PROVIDER_SITE_OTHER): Payer: Medicare HMO | Admitting: Internal Medicine

## 2020-06-07 ENCOUNTER — Encounter: Payer: Self-pay | Admitting: Internal Medicine

## 2020-06-07 DIAGNOSIS — J9611 Chronic respiratory failure with hypoxia: Secondary | ICD-10-CM

## 2020-06-07 DIAGNOSIS — F17210 Nicotine dependence, cigarettes, uncomplicated: Secondary | ICD-10-CM

## 2020-06-07 DIAGNOSIS — J398 Other specified diseases of upper respiratory tract: Secondary | ICD-10-CM | POA: Diagnosis not present

## 2020-06-07 DIAGNOSIS — R69 Illness, unspecified: Secondary | ICD-10-CM | POA: Diagnosis not present

## 2020-06-07 DIAGNOSIS — J449 Chronic obstructive pulmonary disease, unspecified: Secondary | ICD-10-CM

## 2020-06-07 NOTE — Assessment & Plan Note (Signed)
S/p Trach in 2016 "x 6 months" > f/u S [facility] wfu  - Dg Es 03/12/20 Narrowing of the gastroesophageal junction transiently obstructing a 12.5 mm diameter barium tablet, with tablet only passing into the stomach following several swallows of thick barium. Minimal AP narrowing of the upper cervical esophagus at C4-C5, did not obstruct the barium tablet. Mild laryngeal penetration without aspiration. - pfts 1027/21 :  FV curve atypical features both on insp and exp but no plateau   Given def abn on DgEs and only minor changes on f/v loop would pursue GI rx first then refer back to ENT if upper airway symptoms no better

## 2020-06-07 NOTE — Assessment & Plan Note (Addendum)
4-5 min discussion re active cigarette smoking in addition to office E&M  Ask about tobacco use:   ongoing Advise quitting   I took an extended  opportunity with this patient to outline the consequences of continued cigarette use  in airway disorders based on all the data we have from the multiple national lung health studies (perfomed over decades at millions of dollars in cost)  indicating that smoking cessation, not choice of inhalers or physicians, is the most important aspect of care.   Assess willingness:  Hoping to make [place]'s resolution and stick to it  Assist in quit attempt:  Per PCP when ready Arrange follow up:   Follow up per Primary Care planned

## 2020-06-07 NOTE — Assessment & Plan Note (Addendum)
Started on 02 07/19/2014  - as of 12/08/2019 using 02 at hs and prn daytime  Patient is unvaccinated and was informed of the seriousness of COVID 19 infection as a direct risk to lung health as well as safety to close contacts and should continue to wear a facemask in public and minimize exposure to public locations but especially avoid any area or activity where non-close contacts are not observing distancing or wearing an appropriate face mask.  I strongly recommended pt take either of the vaccines available through local drugstores based on updated information on millions of Americans treated with the Moderna and [employer] products  which have proven both safe and  effective even against the new delta and omicron variants.         Each maintenance medication was reviewed in detail including emphasizing most importantly the difference between maintenance and prns and under what circumstances the prns are to be triggered using an action plan format where appropriate.  Total time for H and P, chart review, counseling, teaching device and generating customized AVS unique to this office visit / charting  > 30 min
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
## 2020-06-07 NOTE — Patient Instructions (Addendum)
If your noisy breathing doesn't improve after your stretching, please call Dr [name]  [phone] to follow up your problem with your trach  No change in your medications   Make sure you check your oxygen saturation at your highest level of activity to be sure it stays over 90% and keep track of it at least once a week, more often if breathing getting worse, and let me know if losing ground.     I very strongly recommend you get the moderna or pfizer vaccine as soon as possible based on your risk of dying from the virus  and the proven safety and benefit of these vaccines against even the delta and omicron  variants.  This can save your life as well as  those of your loved ones,  especially if they are also not vaccinated.   The key is to stop smoking completely before smoking completely stops you!  You are cleared for esophageal dilation   Please schedule a follow up visit in 6  months but call sooner if needed

## 2020-06-07 NOTE — Assessment & Plan Note (Signed)
Active smoker  - 12/08/2019  After extensive coaching inhaler device,  effectiveness =    90% with elipta > try trelegy  - PFT's  04/04/20  FEV1 1.02 (33 % ) ratio 0.39  p 8 % improvement from saba p saba 6h prior to study with DLCO  9.74(39%) corrects to 2.37 (58%)  for alv volume and FV curve atypical features both on insp and exp but no plateau   Group D in terms of symptom/risk and laba/lama/ICS  therefore appropriate rx at this point >>>  Continue breztri and prn saba  I spent extra time with pt today reviewing appropriate use of albuterol for prn use on exertion with the following points: 1) saba is for relief of sob that does not improve by walking a slower pace or resting but rather if the pt does not improve after trying this first. 2) If the pt is convinced, as many are, that saba helps recover from activity faster then it's easy to tell if this is the case by re-challenging : ie stop, take the inhaler, then p 5 minutes try the exact same activity (intensity of workload) that just caused the symptoms and see if they are substantially diminished or not after saba 3) if there is an activity that reproducibly causes the symptoms, try the saba 15 min before the activity on alternate days   If in fact the saba really does help, then fine to continue to use it prn but advised may need to look closer at the maintenance regimen being used to achieve better control of airways disease with exertion.

## 2020-06-11 ENCOUNTER — Ambulatory Visit: Payer: Medicare HMO | Admitting: Internal Medicine

## 2020-06-11 ENCOUNTER — Ambulatory Visit: Payer: Medicare HMO | Admitting: Family

## 2020-06-14 ENCOUNTER — Other Ambulatory Visit: Payer: Self-pay

## 2020-06-14 ENCOUNTER — Encounter: Payer: Self-pay | Admitting: Family

## 2020-06-14 ENCOUNTER — Ambulatory Visit (INDEPENDENT_AMBULATORY_CARE_PROVIDER_SITE_OTHER): Payer: Medicare HMO | Admitting: Family

## 2020-06-14 VITALS — BP 162/75 | HR 68 | Temp 97.6°F | Ht 69.0 in | Wt 187.9 lb

## 2020-06-14 DIAGNOSIS — F419 Anxiety disorder, unspecified: Secondary | ICD-10-CM

## 2020-06-14 DIAGNOSIS — Z716 Tobacco abuse counseling: Secondary | ICD-10-CM | POA: Diagnosis not present

## 2020-06-14 DIAGNOSIS — E1159 Type 2 diabetes mellitus with other circulatory complications: Secondary | ICD-10-CM | POA: Diagnosis not present

## 2020-06-14 DIAGNOSIS — F17210 Nicotine dependence, cigarettes, uncomplicated: Secondary | ICD-10-CM

## 2020-06-14 DIAGNOSIS — F1320 Sedative, hypnotic or anxiolytic dependence, uncomplicated: Secondary | ICD-10-CM

## 2020-06-14 DIAGNOSIS — Z720 Tobacco use: Secondary | ICD-10-CM | POA: Diagnosis not present

## 2020-06-14 DIAGNOSIS — I2510 Atherosclerotic heart disease of native coronary artery without angina pectoris: Secondary | ICD-10-CM

## 2020-06-14 DIAGNOSIS — Z91199 Patient's noncompliance with other medical treatment and regimen due to unspecified reason: Secondary | ICD-10-CM

## 2020-06-14 DIAGNOSIS — I152 Hypertension secondary to endocrine disorders: Secondary | ICD-10-CM

## 2020-06-14 DIAGNOSIS — R69 Illness, unspecified: Secondary | ICD-10-CM | POA: Diagnosis not present

## 2020-06-14 DIAGNOSIS — Z9119 Patient's noncompliance with other medical treatment and regimen: Secondary | ICD-10-CM | POA: Diagnosis not present

## 2020-06-14 MED ORDER — ALPRAZOLAM 0.5 MG PO TABS: 0.5000 mg | ORAL_TABLET | Freq: Two times a day (BID) | ORAL | 2 refills | Status: DC | PRN

## 2020-06-14 MED ORDER — NICOTINE 14 MG/24HR TD PT24: 14.0000 mg | MEDICATED_PATCH | Freq: Every day | TRANSDERMAL | 1 refills | Status: DC

## 2020-06-14 NOTE — Patient Instructions (Signed)

## 2020-06-14 NOTE — Progress Notes (Signed)
Subjective:    Patient ID: Ronald [name], male    DOB: 10-12-1945, 75 y.o.   MRN: [id number]  Chief Complaint  Patient presents with  . Hospitalization Follow-up   Pt presents to the office today for hospital follow up. He went to the ED on 05/23/20 for chest pain. He had a cardiac cath performed that showed "nonobstructive coronary artery disease the tightest lesion was in the RV branch cardiology recommended medical management". Questions about compliance with his medications. He was discharged on 05/26/20 with 2L O2. He states he only uses this as needed and only using it at night.   He has decreased his smoking to 10 cigarettes from 1 1/2 packs a day.  Hypertension This is a chronic problem. The current episode started more than 1 year ago. The problem has been waxing and waning since onset. The problem is uncontrolled. Associated symptoms include anxiety, malaise/fatigue and shortness of breath. Pertinent negatives include no peripheral edema. Risk factors for coronary artery disease include dyslipidemia, obesity, male gender and sedentary lifestyle. Past treatments include calcium channel blockers and beta blockers. The current treatment provides mild improvement. Hypertensive end-organ damage includes CAD/MI.  Anxiety Presents for follow-up visit. Symptoms include depressed mood, nausea, nervous/anxious behavior and shortness of breath. Symptoms occur most days. The severity of symptoms is moderate.    COPD Using Breztri. Continue smoking cessation.    Review of Systems  Constitutional: Positive for malaise/fatigue.  Respiratory: Positive for shortness of breath.   Gastrointestinal: Positive for nausea.  Psychiatric/Behavioral: The patient is nervous/anxious.   All other systems reviewed and are negative.      Objective:   Physical Exam Vitals reviewed.  Constitutional:      General: He is not in acute distress.    Appearance: He is well-developed and well-nourished.   HENT:     Head: Normocephalic.     Mouth/Throat:     Mouth: Oropharynx is clear and moist.  Eyes:     General:        Right eye: No discharge.        Left eye: No discharge.     Pupils: Pupils are equal, round, and reactive to light.  Neck:     Thyroid: No thyromegaly.  Cardiovascular:     Rate and Rhythm: Normal rate and regular rhythm.     Pulses: Intact distal pulses.     Heart sounds: Normal heart sounds. No murmur heard.   Pulmonary:     Effort: Pulmonary effort is normal. No respiratory distress.     Breath sounds: Wheezing present.  Abdominal:     General: Bowel sounds are normal. There is no distension.     Palpations: Abdomen is soft.     Tenderness: There is no abdominal tenderness.  Musculoskeletal:        General: Swelling present. No tenderness or edema.     Cervical back: Normal range of motion and neck supple.     Comments: Lymphedema in bilateral arms.   Skin:    General: Skin is warm and dry.     Findings: No erythema or rash.  Neurological:     Mental Status: He is alert and oriented to person, place, and time.     Cranial Nerves: No cranial nerve deficit.     Deep Tendon Reflexes: Reflexes are normal and symmetric.  Psychiatric:        Mood and Affect: Mood and affect normal.        Behavior: Behavior  normal.        Thought Content: Thought content normal.        Judgment: Judgment normal.       BP (!) 162/75   Pulse 68   Temp 97.6 F (36.4 C) (Temporal)   Ht 5\' 9"  (1.753 m)   Wt 187 lb 14.4 oz (85.2 kg)   SpO2 96%   BMI 27.75 kg/m      Assessment & Plan:  Ronald [name] comes in today with chief complaint of Hospitalization Follow-up   Diagnosis and orders addressed:  1. CAD in native artery  2. Hypertension associated with diabetes (HCC) Continue all medications.    3. Noncompliance   4. Tobacco abuse Smoking cessation discussed for 3-4 mins. Nicotine patches ordered.  - nicotine (NICODERM CQ - DOSED IN MG/24 HOURS) 14  mg/24hr patch; Place 1 patch (14 mg total) onto the skin daily.  Dispense: 90 patch; Refill: 1  5. Cigarette smoker - nicotine (NICODERM CQ - DOSED IN MG/24 HOURS) 14 mg/24hr patch; Place 1 patch (14 mg total) onto the skin daily.  Dispense: 90 patch; Refill: 1  6. Encounter for smoking cessation counseling - nicotine (NICODERM CQ - DOSED IN MG/24 HOURS) 14 mg/24hr patch; Place 1 patch (14 mg total) onto the skin daily.  Dispense: 90 patch; Refill: 1   7. Anxiety - ALPRAZolam (XANAX) 0.5 MG tablet; Take 1 tablet (0.5 mg total) by mouth 2 (two) times daily as needed. for anxiety  Dispense: 60 tablet; Refill: 2  8. Benzodiazepine dependence (HCC) Patient reviewed in [place] controlled database, no flags noted. Contract and drug screen are up to date.  - ALPRAZolam (XANAX) 0.5 MG tablet; Take 1 tablet (0.5 mg total) by mouth 2 (two) times daily as needed. for anxiety  Dispense: 60 tablet; Refill: 2  Labs pending Health Maintenance reviewed Diet and exercise encouraged  Follow up plan: 2 months    [name], FNP

## 2020-06-15 LAB — CMP14+EGFR
ALT: 25 IU/L (ref 0–44)
AST: 16 IU/L (ref 0–40)
Albumin/Globulin Ratio: 1.6 (ref 1.2–2.2)
Albumin: 4.1 g/dL (ref 3.7–4.7)
Alkaline Phosphatase: 116 IU/L (ref 44–121)
BUN/Creatinine Ratio: 18 (ref 10–24)
BUN: 17 mg/dL (ref 8–27)
Bilirubin Total: 0.4 mg/dL (ref 0.0–1.2)
CO2: 31 mmol/L — ABNORMAL HIGH (ref 20–29)
Calcium: 9.3 mg/dL (ref 8.6–10.2)
Chloride: 98 mmol/L (ref 96–106)
Creatinine, Ser: 0.95 mg/dL (ref 0.76–1.27)
GFR calc Af Amer: 92 mL/min/{1.73_m2} (ref >59)
GFR calc non Af Amer: 80 mL/min/{1.73_m2} (ref >59)
Globulin, Total: 2.5 g/dL (ref 1.5–4.5)
Glucose: 103 mg/dL — ABNORMAL HIGH (ref 65–99)
Potassium: 4.4 mmol/L (ref 3.5–5.2)
Sodium: 139 mmol/L (ref 134–144)
Total Protein: 6.6 g/dL (ref 6.0–8.5)

## 2020-06-15 LAB — CBC WITH DIFFERENTIAL/PLATELET
Basophils Absolute: 0.1 10*3/uL (ref 0.0–0.2)
Basos: 1 %
EOS (ABSOLUTE): 0.2 10*3/uL (ref 0.0–0.4)
Eos: 2 %
Hematocrit: 42.6 % (ref 37.5–51.0)
Hemoglobin: 14.1 g/dL (ref 13.0–17.7)
Immature Grans (Abs): 0.1 10*3/uL (ref 0.0–0.1)
Immature Granulocytes: 1 %
Lymphocytes Absolute: 2.4 10*3/uL (ref 0.7–3.1)
Lymphs: 26 %
MCH: 29.4 pg (ref 26.6–33.0)
MCHC: 33.1 g/dL (ref 31.5–35.7)
MCV: 89 fL (ref 79–97)
Monocytes Absolute: 0.9 10*3/uL (ref 0.1–0.9)
Monocytes: 10 %
Neutrophils Absolute: 5.5 10*3/uL (ref 1.4–7.0)
Neutrophils: 60 %
Platelets: 348 10*3/uL (ref 150–450)
RBC: 4.79 x10E6/uL (ref 4.14–5.80)
RDW: 12.3 % (ref 11.6–15.4)
WBC: 9.1 10*3/uL (ref 3.4–10.8)
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
## 2020-06-18 ENCOUNTER — Telehealth: Payer: Self-pay | Admitting: Family Medicine

## 2020-06-18 ENCOUNTER — Ambulatory Visit: Payer: Medicare HMO | Admitting: Family Medicine

## 2020-06-18 DIAGNOSIS — Z720 Tobacco use: Secondary | ICD-10-CM

## 2020-06-18 DIAGNOSIS — I2510 Atherosclerotic heart disease of native coronary artery without angina pectoris: Secondary | ICD-10-CM

## 2020-06-18 DIAGNOSIS — I10 Essential (primary) hypertension: Secondary | ICD-10-CM

## 2020-06-18 DIAGNOSIS — E782 Mixed hyperlipidemia: Secondary | ICD-10-CM

## 2020-06-18 MED ORDER — ISOSORBIDE MONONITRATE ER 30 MG PO TB24: 30.0000 mg | ORAL_TABLET | Freq: Every day | ORAL | 1 refills | Status: DC

## 2020-06-18 NOTE — Telephone Encounter (Signed)
Medication sent to pharmacy  

## 2020-06-18 NOTE — Addendum Note (Signed)
Addended by: Julian Hy T on: 06/18/2020 01:05 PM   Modules accepted: Orders

## 2020-06-18 NOTE — Telephone Encounter (Signed)
    1. Which medications need to be refilled? (please list name of each medication and dose if known)  Isosorbide 30 mg   2. Which pharmacy/location (including street and city if local pharmacy) is medication to be sent to?  [place], [place]  3. Do they need a 30 day or 90 day supply? [place]

## 2020-06-19 ENCOUNTER — Telehealth (INDEPENDENT_AMBULATORY_CARE_PROVIDER_SITE_OTHER): Payer: Self-pay

## 2020-06-19 ENCOUNTER — Other Ambulatory Visit: Payer: Self-pay

## 2020-06-19 ENCOUNTER — Ambulatory Visit (INDEPENDENT_AMBULATORY_CARE_PROVIDER_SITE_OTHER): Payer: Medicare HMO | Admitting: Pharmacist

## 2020-06-19 ENCOUNTER — Telehealth (INDEPENDENT_AMBULATORY_CARE_PROVIDER_SITE_OTHER): Payer: Self-pay | Admitting: Gastroenterology

## 2020-06-19 ENCOUNTER — Other Ambulatory Visit (INDEPENDENT_AMBULATORY_CARE_PROVIDER_SITE_OTHER): Payer: Self-pay

## 2020-06-19 DIAGNOSIS — R1310 Dysphagia, unspecified: Secondary | ICD-10-CM

## 2020-06-19 DIAGNOSIS — J449 Chronic obstructive pulmonary disease, unspecified: Secondary | ICD-10-CM | POA: Diagnosis not present

## 2020-06-19 DIAGNOSIS — R197 Diarrhea, unspecified: Secondary | ICD-10-CM

## 2020-06-19 MED ORDER — PLENVU 140 G PO SOLR: 280.0000 g | Freq: Once | ORAL | 0 refills | Status: AC

## 2020-06-19 MED ORDER — BREZTRI AEROSPHERE 160-9-4.8 MCG/ACT IN AERO: 2.0000 | INHALATION_SPRAY | Freq: Two times a day (BID) | RESPIRATORY_TRACT | 11 refills | Status: DC

## 2020-06-19 MED ORDER — PLENVU 140 G PO SOLR: 280.0000 g | Freq: Once | ORAL | 0 refills | Status: DC

## 2020-06-19 NOTE — Telephone Encounter (Signed)
LeighAnn Dalvin Clipper, CMA  

## 2020-06-19 NOTE — Progress Notes (Signed)
HPI Patient presents today to see pharmacy team for COPD education and assistance. Patient has recently seen Dr. Melvyn Novas on 13/30/21 for pulmonology.  He is currently staged as COPD GOLD 3/still smoking.  Past medical history includes:   Past Medical History:  Diagnosis Date  . Anxiety   . Asthma   . Chronic lower back pain   . COPD (chronic obstructive pulmonary disease) ([place])   . COPD (chronic obstructive pulmonary disease) ([place])   . Coronary artery disease   . Depression   . GERD (gastroesophageal reflux disease)   . High cholesterol   . Hypertension   . NSTEMI (non-ST elevated myocardial infarction) ([place]) 05/2014   with stent placement  . Stroke ([place])    anyeusym   . TIA (transient ischemic attack)    "they say I've had some mini strokes; don't know when"; denies residual on 06/22/2014)  . Type II diabetes mellitus ([place])   . Ulcerative colitis ([place])    Number of hospitalizations in past year: 0 Number of COPD exacerbations in past year: 1  Respiratory Medications Current: trelegy (can't afford), albuterol Tried in past: symbicort, advair, breztri Patient reports cost issues with all inhalers  OBJECTIVE      Allergies  Allergen Reactions  . Gabapentin Anxiety    Unknown reaction        Outpatient Encounter Medications as of 04/23/2020  Medication Sig  . albuterol (VENTOLIN HFA) 108 (90 Base) MCG/ACT inhaler INHALE 2 PUFFS EVERY 6 HOURS AS NEEEDED  . ALPRAZolam (XANAX) 0.5 MG tablet Take 1 tablet (0.5 mg total) by mouth 2 (two) times daily as needed. for anxiety  . amLODipine (NORVASC) 5 MG tablet Take 1 tablet (5 mg total) by mouth daily.  Marland Kitchen aspirin EC 81 MG tablet Take 1 tablet (81 mg total) by mouth daily.  Marland Kitchen atorvastatin (LIPITOR) 40 MG tablet TAKE 1 TABLET BY MOUTH DAILY AT 6 PM FOR CHOLESTEROL  . blood glucose meter kit and supplies Dispense based on patient and insurance preference. Use up to four times daily as directed. (FOR ICD-10  E10.9, E11.9).  . busPIRone (BUSPAR) 7.5 MG tablet Take 1 tablet (7.5 mg total) by mouth 3 (three) times daily.  Marland Kitchen escitalopram (LEXAPRO) 20 MG tablet Take 1 tablet (20 mg total) by mouth daily.  . Fluticasone-Umeclidin-Vilant (TRELEGY ELLIPTA) 100-62.5-25 MCG/INH AEPB One click each am and take two good deep drags  . furosemide (LASIX) 20 MG tablet Take 1 tablet (20 mg total) by mouth 2 (two) times daily.  Marland Kitchen glucose blood (ONE TOUCH ULTRA TEST) test strip USE TO CHECK GLUCOSE ONCE DAILY  . hydrOXYzine (VISTARIL) 25 MG capsule Take 1 capsule (25 mg total) by mouth 3 (three) times daily as needed.  Marland Kitchen ipratropium-albuterol (DUONEB) 0.5-2.5 (3) MG/3ML SOLN USE ONE VIAL BY NEBULIZER FOUR TIMES DAILY  . Lancets (ONETOUCH ULTRASOFT) lancets Use as instructed   DX E11.9  . losartan (COZAAR) 100 MG tablet Take 1 tablet (100 mg total) by mouth daily.  . metoprolol tartrate (LOPRESSOR) 50 MG tablet Take 1 tablet (50 mg total) by mouth 2 (two) times daily.  . mirtazapine (REMERON) 15 MG tablet Take 1 tablet (15 mg total) by mouth at bedtime.  . nitroGLYCERIN (NITROSTAT) 0.4 MG SL tablet DISSOLVE TAKE 1 TABLET UNDER THE TONGUE EVERY FIVE MINUTES AS NEEDED FOR CHEST PAIN  . pantoprazole (PROTONIX) 40 MG tablet Take 1 tablet (40 mg total) by mouth daily.  . potassium chloride SA (KLOR-CON) 20 MEQ tablet Take 1  tablet (20 mEq total) by mouth 3 (three) times daily.  . sitaGLIPtin (JANUVIA) 50 MG tablet Take 1 tablet (50 mg total) by mouth daily.   No facility-administered encounter medications on file as of 04/23/2020.         Immunization History  Administered Date(s) Administered  . Fluad Quad(high Dose 65+) 04/01/2019  . Influenza Split 03/09/2014  . Influenza-Unspecified 08/20/2016  . Pneumococcal Conjugate-13 11/19/2017  . Pneumococcal Polysaccharide-23 08/20/2016  . Tdap 10/06/1996, 06/13/2001     PFTs PFT Results Latest Ref Rng & Units 04/04/2020  FVC-Pre L 2.37  FVC-Predicted Pre % 56   FVC-Post L 2.60  FVC-Predicted Post % 62  Pre FEV1/FVC % % 40  Post FEV1/FCV % % 39  FEV1-Pre L 0.94  FEV1-Predicted Pre % 30  FEV1-Post L 1.02  DLCO uncorrected ml/min/mmHg 9.74  DLCO UNC% % 39  DLVA Predicted % 58     Eosinophils Most recent blood eosinophil count was 2 cells/microL taken on 06/14/20  IgE   - PFT's 10/27/21FEV1 1.02 (33 % ) ratio 0.39 p 8 % improvement from saba p saba 6h prior to study with DLCO 9.74(39%) corrects to 2.37 (58%) for alv volume andFV curve atypical features both on insp and exp but no plateau   Assessment   1. Inhaler Optimization  Optimal inhaler for patient would be BREZTRI considering ability to OBTAIN trelegy.  Patient assistance program would make Breztri the easiest medication for patient to get.  Patient was counseled on the purpose, proper use, and adverse effects of Breztria inhaler. Instructed patient to rinse mouth with water after using in order to prevent fungal infection. Patient verbalized understanding.  Reviewed appropriate use of maintenance vs rescue inhalers. Stressed importance of using maintenance inhaler daily and rescue inhaler only as needed. Patient verbalized understanding.  Demonstrated proper inhaler technique using Breztri demo inhaler. Patient able to demonstrate proper inhaler technique using teach back method. Patient was given sample in office today. His inhaler was primed and able to administer first dose in office without issue.  2. Immunizations  Patient is indicated for the influenzae, pneumonia, and shingles vaccinations.  PLAN  SWITCH TO BREZTRI Inhaler-2 puffs twice daily  Samples given of trelegy in the meantime (no samples of Breztri available  Application for AZ&me filled out and given to PCP for cosign  Discussed methods of cutting back cigarette intake.  Patient to remove several cigarettes from his pack every few weeks to decreased the amount he is smoking daily.  He  has already tried and failed patch, gum, chantix.  He wants to quit.  We may retry chantix generic or nicotine replacement OTC at next visit  All questions encouraged and answered. Instructed patient to reach out with any further questions or concerns.  Thank you for allowing pharmacy to participate in this patient's care.  This appointment required25 minutesof patient care (this includes precharting, chart review, review of results, face-to-face care, etc.).   Regina Eck, PharmD, BCPS Clinical Pharmacist, [place]  II Phone [phone]

## 2020-06-19 NOTE — Telephone Encounter (Signed)
Mr Tiggs has been released from Cardiology and Pulmonology for procedure clearance and he is scheduled on 07/20/20

## 2020-06-19 NOTE — Telephone Encounter (Signed)
Thanks

## 2020-06-19 NOTE — Telephone Encounter (Signed)
Male called and left message on voice mail stating the doctors have cleared patient to have procedure - doesn't  know why he has not heard anything from scheduling - please advise - ph# [phone]

## 2020-06-28 ENCOUNTER — Telehealth: Payer: Self-pay | Admitting: Family Medicine

## 2020-06-28 NOTE — Telephone Encounter (Signed)
Received called from patient stating that [place] states that they did not receive refill on patient's  Isosorbide 30 mg   [facility] - [place], [place]

## 2020-06-28 NOTE — Telephone Encounter (Signed)
[name] notified - spoke with [facility] - they do have prescription & she can pick up this evening.

## 2020-07-04 ENCOUNTER — Telehealth: Payer: Self-pay

## 2020-07-04 DIAGNOSIS — F419 Anxiety disorder, unspecified: Secondary | ICD-10-CM

## 2020-07-04 DIAGNOSIS — F1320 Sedative, hypnotic or anxiolytic dependence, uncomplicated: Secondary | ICD-10-CM

## 2020-07-04 MED ORDER — HYDROXYZINE PAMOATE 25 MG PO CAPS
25.0000 mg | ORAL_CAPSULE | Freq: Three times a day (TID) | ORAL | 1 refills | Status: DC | PRN
Start: 2020-07-04 — End: 2020-09-12

## 2020-07-04 NOTE — Telephone Encounter (Signed)
  Prescription Request  07/04/2020  What is the name of the medication or equipment? hydrOXYzine (VISTARIL) 25 MG capsule  Have you contacted your pharmacy to request a refill? (if applicable) no  Which pharmacy would you like this sent to? [place]   Patient notified that their request is being sent to the clinical staff for review and that they should receive a response within 2 business days.

## 2020-07-04 NOTE — Telephone Encounter (Signed)
Refill sent in, patient aware 

## 2020-07-06 DIAGNOSIS — J439 Emphysema, unspecified: Secondary | ICD-10-CM | POA: Diagnosis not present

## 2020-07-10 ENCOUNTER — Encounter (INDEPENDENT_AMBULATORY_CARE_PROVIDER_SITE_OTHER): Payer: Self-pay

## 2020-07-10 ENCOUNTER — Other Ambulatory Visit: Payer: Self-pay | Admitting: Family

## 2020-07-10 DIAGNOSIS — G4700 Insomnia, unspecified: Secondary | ICD-10-CM

## 2020-07-17 NOTE — Patient Instructions (Signed)
Ronald [name]  07/17/2020     @PREFPERIOPPHARMACY @   Your procedure is scheduled on  07/20/2020   Report to [facility] at  1100  A.M.   Call this number if you have problems the morning of surgery:  [phone]   Remember:  Follow the diet and prep instructions given to you by the office.                      Take these medicines the morning of surgery with A SIP OF WATER  Xanax (if needed), amlodipine, lexapro, hydryoxyzine(if needed), isosorbide, metoprolol, protonix.    Please brush your teeth.  Do not wear jewelry, make-up or nail polish.  Do not wear lotions, powders, or perfumes, or deodorant.  Do not shave 48 hours prior to surgery.  Men may shave face and neck.  Do not bring valuables to the hospital.  [facility] is not responsible for any belongings or valuables.  Contacts, dentures or bridgework may not be worn into surgery.  Leave your suitcase in the car.  After surgery it may be brought to your room.  For patients admitted to the hospital, discharge time will be determined by your treatment team.  Patients discharged the day of surgery will not be allowed to drive home and must have someone with them for 24 hours.   Special instructions:  DO NOT smoke tobacco or vape the morning of your procedure.  Please read over the following fact sheets that you were given. Anesthesia Post-op Instructions and Care and Recovery After Surgery       Upper Endoscopy, Adult, Care After This sheet gives you information about how to care for yourself after your procedure. Your health care provider may also give you more specific instructions. If you have problems or questions, contact your health care provider. What can I expect after the procedure? After the procedure, it is common to have:  A sore throat.  Mild stomach pain or discomfort.  Bloating.  Nausea. Follow these instructions at home:  Follow instructions from your health care provider about  what to eat or drink after your procedure.  Return to your normal activities as told by your health care provider. Ask your health care provider what activities are safe for you.  Take over-the-counter and prescription medicines only as told by your health care provider.  If you were given a sedative during the procedure, it can affect you for several hours. Do not drive or operate machinery until your health care provider says that it is safe.  Keep all follow-up visits as told by your health care provider. This is important.   Contact a health care provider if you have:  A sore throat that lasts longer than one day.  Trouble swallowing. Get help right away if:  You vomit blood or your vomit looks like coffee grounds.  You have: ? A fever. ? Bloody, black, or tarry stools. ? A severe sore throat or you cannot swallow. ? Difficulty breathing. ? Severe pain in your chest or abdomen. Summary  After the procedure, it is common to have a sore throat, mild stomach discomfort, bloating, and nausea.  If you were given a sedative during the procedure, it can affect you for several hours. Do not drive or operate machinery until your health care provider says that it is safe.  Follow instructions from your health care provider about what to eat or  drink after your procedure.  Return to your normal activities as told by your health care provider. This information is not intended to replace advice given to you by your health care provider. Make sure you discuss any questions you have with your health care provider. Document Revised: 05/24/2019 Document Reviewed: 10/26/2017 Elsevier Patient Education  2021 [place].  https://www.asge.org/home/for-patients/patient-information/understanding-eso-dilation-updated">  Esophageal Dilatation Esophageal dilatation, also called esophageal dilation, is a procedure to widen or open a blocked or narrowed part of the esophagus. The esophagus is the  part of the body that moves food and liquid from the mouth to the stomach. You may need this procedure if:  You have a buildup of scar tissue in your esophagus that makes it difficult, painful, or impossible to swallow. This can be caused by gastroesophageal reflux disease (GERD).  You have cancer of the esophagus.  There is a problem with how food moves through your esophagus. In some cases, you may need this procedure repeated at a later time to dilate the esophagus gradually. Tell a health care provider about:  Any allergies you have.  All medicines you are taking, including vitamins, herbs, eye drops, creams, and over-the-counter medicines.  Any problems you or family members have had with anesthetic medicines.  Any blood disorders you have.  Any surgeries you have had.  Any medical conditions you have.  Any antibiotic medicines you are required to take before dental procedures.  Whether you are pregnant or may be pregnant. What are the risks? Generally, this is a safe procedure. However, problems may occur, including:  Bleeding due to a tear in the lining of the esophagus.  A hole, or perforation, in the esophagus. What happens before the procedure?  Ask your health care provider about: ? Changing or stopping your regular medicines. This is especially important if you are taking diabetes medicines or blood thinners. ? Taking medicines such as aspirin and ibuprofen. These medicines can thin your blood. Do not take these medicines unless your health care provider tells you to take them. ? Taking over-the-counter medicines, vitamins, herbs, and supplements.  Follow instructions from your health care provider about eating or drinking restrictions.  Plan to have a responsible adult take you home from the hospital or clinic.  Plan to have a responsible adult care for you for the time you are told after you leave the hospital or clinic. This is important. What happens during  the procedure?  You may be given a medicine to help you relax (sedative).  A numbing medicine may be sprayed into the back of your throat, or you may gargle the medicine.  Your health care provider may perform the dilatation using various surgical instruments, such as: ? Simple dilators. This instrument is carefully placed in the esophagus to stretch it. ? Guided wire bougies. This involves using an endoscope to insert a wire into the esophagus. A dilator is passed over this wire to enlarge the esophagus. Then the wire is removed. ? Balloon dilators. An endoscope with a small balloon is inserted into the esophagus. The balloon is inflated to stretch the esophagus and open it up. The procedure may vary among health care providers and hospitals. What can I expect after the procedure?  Your blood pressure, heart rate, breathing rate, and blood oxygen level will be monitored until you leave the hospital or clinic.  Your throat may feel slightly sore and numb. This will get better over time.  You will not be allowed to eat or drink  until your throat is no longer numb.  When you are able to drink, urinate, and sit on the edge of the bed without nausea or dizziness, you may be able to return home. Follow these instructions at home:  Take over-the-counter and prescription medicines only as told by your health care provider.  If you were given a sedative during the procedure, it can affect you for several hours. Do not drive or operate machinery until your health care provider says that it is safe.  Plan to have a responsible adult care for you for the time you are told. This is important.  Follow instructions from your health care provider about any eating or drinking restrictions.  Do not use any products that contain nicotine or tobacco, such as cigarettes, e-cigarettes, and chewing tobacco. If you need help quitting, ask your health care provider.  Keep all follow-up visits. This is  important. Contact a health care provider if:  You have a fever.  You have pain that is not relieved by medicine. Get help right away if:  You have chest pain.  You have trouble breathing.  You have trouble swallowing.  You vomit blood.  You have black, tarry, or bloody stools. These symptoms may represent a serious problem that is an emergency. Do not wait to see if the symptoms will go away. Get medical help right away. Call your local emergency services (911 in the [place] Do not drive yourself to the hospital. Summary  Esophageal dilatation, also called esophageal dilation, is a procedure to widen or open a blocked or narrowed part of the esophagus.  Plan to have a responsible adult take you home from the hospital or clinic.  For this procedure, a numbing medicine may be sprayed into the back of your throat, or you may gargle the medicine.  Do not drive or operate machinery until your health care provider says that it is safe. This information is not intended to replace advice given to you by your health care provider. Make sure you discuss any questions you have with your health care provider. Document Revised: 10/12/2019 Document Reviewed: 10/12/2019 Elsevier Patient Education  2021 [place].  Colonoscopy, Adult, Care After This sheet gives you information about how to care for yourself after your procedure. Your health care provider may also give you more specific instructions. If you have problems or questions, contact your health care provider. What can I expect after the procedure? After the procedure, it is common to have:  A small amount of blood in your stool for 24 hours after the procedure.  Some gas.  Mild cramping or bloating of your abdomen. Follow these instructions at home: Eating and drinking  Drink enough fluid to keep your urine pale yellow.  Follow instructions from your health care provider about eating or drinking restrictions.  Resume  your normal diet as instructed by your health care provider. Avoid heavy or fried foods that are hard to digest.   Activity  Rest as told by your health care provider.  Avoid sitting for a long time without moving. Get up to take short walks every 1-2 hours. This is important to improve blood flow and breathing. Ask for help if you feel weak or unsteady.  Return to your normal activities as told by your health care provider. Ask your health care provider what activities are safe for you. Managing cramping and bloating  Try walking around when you have cramps or feel bloated.  Apply heat to your abdomen  as told by your health care provider. Use the heat source that your health care provider recommends, such as a moist heat pack or a heating pad. ? Place a towel between your skin and the heat source. ? Leave the heat on for 20-30 minutes. ? Remove the heat if your skin turns bright red. This is especially important if you are unable to feel pain, heat, or cold. You may have a greater risk of getting burned.   General instructions  If you were given a sedative during the procedure, it can affect you for several hours. Do not drive or operate machinery until your health care provider says that it is safe.  For the first 24 hours after the procedure: ? Do not sign important documents. ? Do not drink alcohol. ? Do your regular daily activities at a slower pace than normal. ? Eat soft foods that are easy to digest.  Take over-the-counter and prescription medicines only as told by your health care provider.  Keep all follow-up visits as told by your health care provider. This is important. Contact a health care provider if:  You have blood in your stool 2-3 days after the procedure. Get help right away if you have:  More than a small spotting of blood in your stool.  Large blood clots in your stool.  Swelling of your abdomen.  Nausea or vomiting.  A fever.  Increasing pain in your  abdomen that is not relieved with medicine. Summary  After the procedure, it is common to have a small amount of blood in your stool. You may also have mild cramping and bloating of your abdomen.  If you were given a sedative during the procedure, it can affect you for several hours. Do not drive or operate machinery until your health care provider says that it is safe.  Get help right away if you have a lot of blood in your stool, nausea or vomiting, a fever, or increased pain in your abdomen. This information is not intended to replace advice given to you by your health care provider. Make sure you discuss any questions you have with your health care provider. Document Revised: 05/20/2019 Document Reviewed: 12/20/2018 Elsevier Patient Education  2021 [place] After This sheet gives you information about how to care for yourself after your procedure. Your health care provider may also give you more specific instructions. If you have problems or questions, contact your health care provider. What can I expect after the procedure? After the procedure, it is common to have:  Tiredness.  Forgetfulness about what happened after the procedure.  Impaired judgment for important decisions.  Nausea or vomiting.  Some difficulty with balance. Follow these instructions at home: For the time period you were told by your health care provider:  Rest as needed.  Do not participate in activities where you could fall or become injured.  Do not drive or use machinery.  Do not drink alcohol.  Do not take sleeping pills or medicines that cause drowsiness.  Do not make important decisions or sign legal documents.  Do not take care of children on your own.      Eating and drinking  Follow the diet that is recommended by your health care provider.  Drink enough fluid to keep your urine pale yellow.  If you vomit: ? Drink water, juice, or soup when you  can drink without vomiting. ? Make sure you have little or no nausea before eating  solid foods. General instructions  Have a responsible adult stay with you for the time you are told. It is important to have someone help care for you until you are awake and alert.  Take over-the-counter and prescription medicines only as told by your health care provider.  If you have sleep apnea, surgery and certain medicines can increase your risk for breathing problems. Follow instructions from your health care provider about wearing your sleep device: ? Anytime you are sleeping, including during daytime naps. ? While taking prescription pain medicines, sleeping medicines, or medicines that make you drowsy.  Avoid smoking.  Keep all follow-up visits as told by your health care provider. This is important. Contact a health care provider if:  You keep feeling nauseous or you keep vomiting.  You feel light-headed.  You are still sleepy or having trouble with balance after 24 hours.  You develop a rash.  You have a fever.  You have redness or swelling around the IV site. Get help right away if:  You have trouble breathing.  You have new-onset confusion at home. Summary  For several hours after your procedure, you may feel tired. You may also be forgetful and have poor judgment.  Have a responsible adult stay with you for the time you are told. It is important to have someone help care for you until you are awake and alert.  Rest as told. Do not drive or operate machinery. Do not drink alcohol or take sleeping pills.  Get help right away if you have trouble breathing, or if you suddenly become confused. This information is not intended to replace advice given to you by your health care provider. Make sure you discuss any questions you have with your health care provider. Document Revised: 02/09/2020 Document Reviewed: 04/28/2019 Elsevier Patient Education  2021 [employer].

## 2020-07-18 ENCOUNTER — Encounter (HOSPITAL_COMMUNITY)
Admission: RE | Admit: 2020-07-18 | Discharge: 2020-07-18 | Disposition: A | Discharge status: Home / Self Care | Payer: Medicare HMO | Source: Ambulatory Visit | Attending: Gastroenterology | Admitting: Gastroenterology

## 2020-07-18 ENCOUNTER — Other Ambulatory Visit: Payer: Self-pay

## 2020-07-18 ENCOUNTER — Other Ambulatory Visit (HOSPITAL_COMMUNITY)
Admission: RE | Admit: 2020-07-18 | Discharge: 2020-07-18 | Disposition: A | Discharge status: Home / Self Care | Payer: Medicare HMO | Source: Ambulatory Visit | Attending: Gastroenterology | Admitting: Gastroenterology

## 2020-07-18 ENCOUNTER — Encounter (HOSPITAL_COMMUNITY): Payer: Self-pay

## 2020-07-18 DIAGNOSIS — Z01812 Encounter for preprocedural laboratory examination: Secondary | ICD-10-CM | POA: Diagnosis not present

## 2020-07-18 DIAGNOSIS — Z20822 Contact with and (suspected) exposure to covid-19: Secondary | ICD-10-CM | POA: Insufficient documentation

## 2020-07-18 HISTORY — DX: Sleep apnea, unspecified: G47.30

## 2020-07-18 LAB — SARS CORONAVIRUS 2 (TAT 6-24 HRS): SARS Coronavirus 2: NEGATIVE

## 2020-07-20 ENCOUNTER — Encounter (HOSPITAL_COMMUNITY): Payer: Self-pay | Admitting: Gastroenterology

## 2020-07-20 ENCOUNTER — Ambulatory Visit (HOSPITAL_COMMUNITY): Payer: Medicare HMO | Admitting: Certified Registered"

## 2020-07-20 ENCOUNTER — Encounter (HOSPITAL_COMMUNITY)
Admission: RE | Disposition: A | Discharge status: Home / Self Care | Payer: Self-pay | Source: Home / Self Care | Attending: Gastroenterology

## 2020-07-20 ENCOUNTER — Other Ambulatory Visit: Payer: Self-pay

## 2020-07-20 ENCOUNTER — Ambulatory Visit (HOSPITAL_COMMUNITY)
Admission: RE | Admit: 2020-07-20 | Discharge: 2020-07-20 | Disposition: A | Discharge status: Home / Self Care | Payer: Medicare HMO | Attending: Gastroenterology | Admitting: Gastroenterology

## 2020-07-20 DIAGNOSIS — D128 Benign neoplasm of rectum: Secondary | ICD-10-CM | POA: Insufficient documentation

## 2020-07-20 DIAGNOSIS — K2289 Other specified disease of esophagus: Secondary | ICD-10-CM

## 2020-07-20 DIAGNOSIS — Z8673 Personal history of transient ischemic attack (TIA), and cerebral infarction without residual deficits: Secondary | ICD-10-CM | POA: Insufficient documentation

## 2020-07-20 DIAGNOSIS — Z9981 Dependence on supplemental oxygen: Secondary | ICD-10-CM | POA: Insufficient documentation

## 2020-07-20 DIAGNOSIS — K259 Gastric ulcer, unspecified as acute or chronic, without hemorrhage or perforation: Secondary | ICD-10-CM | POA: Insufficient documentation

## 2020-07-20 DIAGNOSIS — K648 Other hemorrhoids: Secondary | ICD-10-CM | POA: Diagnosis not present

## 2020-07-20 DIAGNOSIS — K2970 Gastritis, unspecified, without bleeding: Secondary | ICD-10-CM | POA: Insufficient documentation

## 2020-07-20 DIAGNOSIS — Z09 Encounter for follow-up examination after completed treatment for conditions other than malignant neoplasm: Secondary | ICD-10-CM

## 2020-07-20 DIAGNOSIS — K449 Diaphragmatic hernia without obstruction or gangrene: Secondary | ICD-10-CM | POA: Insufficient documentation

## 2020-07-20 DIAGNOSIS — K621 Rectal polyp: Secondary | ICD-10-CM

## 2020-07-20 DIAGNOSIS — R1310 Dysphagia, unspecified: Secondary | ICD-10-CM | POA: Diagnosis not present

## 2020-07-20 DIAGNOSIS — R1013 Epigastric pain: Secondary | ICD-10-CM | POA: Diagnosis not present

## 2020-07-20 DIAGNOSIS — I2510 Atherosclerotic heart disease of native coronary artery without angina pectoris: Secondary | ICD-10-CM | POA: Insufficient documentation

## 2020-07-20 DIAGNOSIS — Z7982 Long term (current) use of aspirin: Secondary | ICD-10-CM | POA: Diagnosis not present

## 2020-07-20 DIAGNOSIS — L918 Other hypertrophic disorders of the skin: Secondary | ICD-10-CM | POA: Insufficient documentation

## 2020-07-20 DIAGNOSIS — Z79899 Other long term (current) drug therapy: Secondary | ICD-10-CM | POA: Diagnosis not present

## 2020-07-20 DIAGNOSIS — K644 Residual hemorrhoidal skin tags: Secondary | ICD-10-CM | POA: Diagnosis not present

## 2020-07-20 DIAGNOSIS — Z888 Allergy status to other drugs, medicaments and biological substances status: Secondary | ICD-10-CM | POA: Insufficient documentation

## 2020-07-20 DIAGNOSIS — Z955 Presence of coronary angioplasty implant and graft: Secondary | ICD-10-CM | POA: Diagnosis not present

## 2020-07-20 DIAGNOSIS — F32A Depression, unspecified: Secondary | ICD-10-CM | POA: Insufficient documentation

## 2020-07-20 DIAGNOSIS — D120 Benign neoplasm of cecum: Secondary | ICD-10-CM | POA: Diagnosis not present

## 2020-07-20 DIAGNOSIS — K219 Gastro-esophageal reflux disease without esophagitis: Secondary | ICD-10-CM | POA: Diagnosis not present

## 2020-07-20 DIAGNOSIS — K208 Other esophagitis without bleeding: Secondary | ICD-10-CM | POA: Insufficient documentation

## 2020-07-20 DIAGNOSIS — I252 Old myocardial infarction: Secondary | ICD-10-CM | POA: Insufficient documentation

## 2020-07-20 DIAGNOSIS — J449 Chronic obstructive pulmonary disease, unspecified: Secondary | ICD-10-CM | POA: Insufficient documentation

## 2020-07-20 DIAGNOSIS — F17210 Nicotine dependence, cigarettes, uncomplicated: Secondary | ICD-10-CM | POA: Insufficient documentation

## 2020-07-20 DIAGNOSIS — K5190 Ulcerative colitis, unspecified, without complications: Secondary | ICD-10-CM | POA: Diagnosis not present

## 2020-07-20 DIAGNOSIS — K5730 Diverticulosis of large intestine without perforation or abscess without bleeding: Secondary | ICD-10-CM | POA: Diagnosis not present

## 2020-07-20 DIAGNOSIS — D123 Benign neoplasm of transverse colon: Secondary | ICD-10-CM | POA: Diagnosis not present

## 2020-07-20 HISTORY — PX: BIOPSY: SHX5522

## 2020-07-20 HISTORY — PX: ESOPHAGOGASTRODUODENOSCOPY (EGD) WITH PROPOFOL: SHX5813

## 2020-07-20 HISTORY — PX: COLONOSCOPY WITH PROPOFOL: SHX5780

## 2020-07-20 HISTORY — PX: ESOPHAGEAL DILATION: SHX303

## 2020-07-20 HISTORY — PX: POLYPECTOMY: SHX149

## 2020-07-20 LAB — HM COLONOSCOPY

## 2020-07-20 LAB — GLUCOSE, CAPILLARY
Glucose-Capillary: 130 mg/dL — ABNORMAL HIGH (ref 70–99)
Glucose-Capillary: 167 mg/dL — ABNORMAL HIGH (ref 70–99)

## 2020-07-20 SURGERY — COLONOSCOPY WITH PROPOFOL
Anesthesia: General

## 2020-07-20 MED ORDER — PHENYLEPHRINE 40 MCG/ML (10ML) SYRINGE FOR IV PUSH (FOR BLOOD PRESSURE SUPPORT)
PREFILLED_SYRINGE | INTRAVENOUS | Status: AC
  Filled 2020-07-20: qty 10

## 2020-07-20 MED ORDER — LIDOCAINE HCL (CARDIAC) PF 100 MG/5ML IV SOSY
PREFILLED_SYRINGE | INTRAVENOUS | Status: DC | PRN
  Administered 2020-07-20: 50 mg via INTRAVENOUS

## 2020-07-20 MED ORDER — PROPOFOL 500 MG/50ML IV EMUL
INTRAVENOUS | Status: DC | PRN
  Administered 2020-07-20: 150 ug/kg/min via INTRAVENOUS

## 2020-07-20 MED ORDER — PHENYLEPHRINE 40 MCG/ML (10ML) SYRINGE FOR IV PUSH (FOR BLOOD PRESSURE SUPPORT)
PREFILLED_SYRINGE | INTRAVENOUS | Status: DC | PRN
  Administered 2020-07-20: 80 ug via INTRAVENOUS

## 2020-07-20 MED ORDER — PROPOFOL 10 MG/ML IV BOLUS
INTRAVENOUS | Status: DC | PRN
  Administered 2020-07-20: 100 mg via INTRAVENOUS

## 2020-07-20 MED ORDER — LACTATED RINGERS IV SOLN
INTRAVENOUS | Status: DC
  Administered 2020-07-20: 1000 mL via INTRAVENOUS

## 2020-07-20 MED ORDER — STERILE WATER FOR IRRIGATION IR SOLN
Status: DC | PRN
  Administered 2020-07-20 (×2): 100 mL

## 2020-07-20 NOTE — Op Note (Signed)
[facility] Patient Name: Ronald [name] Procedure Date: 07/20/2020 12:25 PM MRN: [id number] Date of Birth: 1946-03-15 Attending MD: Maylon Peppers ,  CSN: [id number] Age: 75 Admit Type: Outpatient Procedure:                Upper GI endoscopy Indications:              Dysphagia Providers:                Maylon Peppers, Crystal Page, Randa Spike,                            Technician Referring MD:              Medicines:                Monitored Anesthesia Care Complications:            No immediate complications. Estimated Blood Loss:     Estimated blood loss: none. Procedure:                Pre-Anesthesia Assessment:                           - Prior to the procedure, a History and Physical                            was performed, and patient medications, allergies                            and sensitivities were reviewed. The patient's                            tolerance of previous anesthesia was reviewed.                           - The risks and benefits of the procedure and the                            sedation options and risks were discussed with the                            patient. All questions were answered and informed                            consent was obtained.                           - ASA Grade Assessment: III - A patient with severe                            systemic disease.                           After obtaining informed consent, the endoscope was                            passed under direct vision. Throughout the  procedure, the patient's blood pressure, pulse, and                            oxygen saturations were monitored continuously. The                            GIF-H190 ([id number]) scope was introduced through the                            mouth, and advanced to the second part of duodenum.                            The upper GI endoscopy was accomplished without                            difficulty.  The patient tolerated the procedure                            well. Scope In: 12:32:49 PM Scope Out: 45:40:98 PM Total Procedure Duration: 0 hours 14 minutes 13 seconds  Findings:      No endoscopic abnormality was evident in the esophagus to explain the       patient's complaint of dysphagia. It was decided, however, to proceed       with dilation at the gastroesophageal junction. A TTS dilator was passed       through the scope. Dilation with an 18-19-20 mm balloon dilator was       performed to 20 mm. No hematin was seen upon reinspection of this area.      White nummular lesions were noted in the entire esophagus. Biopsies were       taken with a cold forceps for histology.      A single area of mucosal nodularity was found just distal to the       gastroesophageal junction. Imaging was performed using white light and       narrow band imaging to visualize the mucosa. Biopsies were taken with a       cold forceps for histology.      A 3 cm hiatal hernia with a few Cameron ulcers was found.      Localized mild inflammation characterized by erythema was found in the       gastric antrum.      The examined duodenum was normal. Impression:               - No endoscopic esophageal abnormality to explain                            patient's dysphagia. Esophagus dilated. Dilated.                           - White nummular lesions in esophageal mucosa.                            Biopsied.                           - Mucosal nodule found in the esophagus. Biopsied.                           -  3 cm hiatal hernia with a few Cameron ulcers.                           - Gastritis.                           - Normal examined duodenum. Moderate Sedation:      Per Anesthesia Care Recommendation:           - Discharge patient to home (ambulatory).                           - Resume previous diet.                           - Await pathology results. Procedure Code(s):        --- Professional  ---                           [phone], Esophagogastroduodenoscopy, flexible,                            transoral; with transendoscopic balloon dilation of                            esophagus (less than 30 mm diameter)                           43239, 59, Esophagogastroduodenoscopy, flexible,                            transoral; with biopsy, single or multiple Diagnosis Code(s):        --- Professional ---                           R13.10, Dysphagia, unspecified                           K22.8, Other specified diseases of esophagus                           K44.9, Diaphragmatic hernia without obstruction or                            gangrene                           K25.9, Gastric ulcer, unspecified as acute or                            chronic, without hemorrhage or perforation                           K29.70, Gastritis, unspecified, without bleeding CPT copyright 2019 American Medical Association. All rights reserved. The codes documented in this report are preliminary and upon coder review may  be revised to meet current compliance requirements. Maylon Peppers, MD Maylon Peppers,  07/20/2020 1:40:00 PM This report has been signed electronically. Number  of Addenda: 0

## 2020-07-20 NOTE — Transfer of Care (Signed)
Immediate Anesthesia Transfer of Care Note  Patient: LATHYN GRIGGS  Procedure(s) Performed: COLONOSCOPY WITH PROPOFOL (N/A ) ESOPHAGOGASTRODUODENOSCOPY (EGD) WITH PROPOFOL (N/A ) ESOPHAGEAL DILATION (N/A ) BIOPSY POLYPECTOMY INTESTINAL  Patient Location: PACU  Anesthesia Type:General  Level of Consciousness: drowsy  Airway & Oxygen Therapy: Patient Spontanous Breathing and Patient connected to nasal cannula oxygen  Post-op Assessment: Report given to RN and Post -op Vital signs reviewed and stable  Post vital signs: Reviewed and stable  Last Vitals:  Vitals Value Taken Time  BP    Temp    Pulse    Resp    SpO2      Last Pain:  Vitals:   07/20/20 1226  TempSrc:   PainSc: 0-No pain      Patients Stated Pain Goal: 8 (72/76/18 4859)  Complications: No complications documented.

## 2020-07-20 NOTE — Anesthesia Postprocedure Evaluation (Signed)
Anesthesia Post Note  Patient: Ronald [name]  Procedure(s) Performed: COLONOSCOPY WITH PROPOFOL (N/A ) ESOPHAGOGASTRODUODENOSCOPY (EGD) WITH PROPOFOL (N/A ) ESOPHAGEAL DILATION (N/A ) BIOPSY POLYPECTOMY INTESTINAL  Patient location during evaluation: Phase II Anesthesia Type: General Level of consciousness: awake and alert and oriented Pain management: pain level controlled Vital Signs Assessment: post-procedure vital signs reviewed and stable Respiratory status: nonlabored ventilation, spontaneous breathing and respiratory function stable Cardiovascular status: blood pressure returned to baseline and stable Postop Assessment: no apparent nausea or vomiting Anesthetic complications: no   No complications documented.   Last Vitals:  Vitals:   07/20/20 1430 07/20/20 1452  BP: 131/64 (!) 155/82  Pulse: (!) 46 (!) 55  Resp: (!) 24 (!) 24  Temp:  36.4 C  SpO2: 98% 98%    Last Pain:  Vitals:   07/20/20 1452  TempSrc: Oral  PainSc: 0-No pain                 Orlie Dakin

## 2020-07-20 NOTE — H&P (Signed)
Ronald [name] is an 75 y.o. male.   Chief Complaint: chronic diarrha, UC and dysphagia. HPI: Ronald [name] is a 75 y.o. male with PMH COPD on oxygen at home, depression,CAD, GERD, NSTEMI s/p stent, stroke and TIA, ulcerative colitis, who presents to the hospital for evaluation of his head to colitis, dysphagia and chronic diarrhea.  The patient endorsed having persistent dysphagia to both solids and liquids which has been intermittent in nature for the last 4 years.  However most recently he has had daily symptoms.  The patient underwent barium esophagram that showed presence of delayed passage of a tablet at the GE junction suggestive of a stricture.  In the right hand, he has presented intermittent episodes of diarrhea chronically although he reports that most recently he has had periods of time where he feels constipated and has no bowel movements for close to 4 days.  He is not currently taking any medication for his ulcerative colitis which was diagnosed multiple years ago.  His last colonoscopy was performed more than 10 years ago.  Past Medical History:  Diagnosis Date  . Anxiety   . Asthma   . Chronic lower back pain   . COPD (chronic obstructive pulmonary disease) ([place])   . Coronary artery disease    a. NSTEMI 05/2014 s/p DESx2 to LAD at [facility].  . Depression   . GERD (gastroesophageal reflux disease)   . High cholesterol   . Hypertension   . NSTEMI (non-ST elevated myocardial infarction) ([place]) 05/2014   with stent placement  . Sleep apnea   . Stroke ([place]) 2017   anyeusym   . TIA (transient ischemic attack)    "they say I've had some mini strokes; don't know when"; denies residual on 06/22/2014)  . Type II diabetes mellitus ([place])   . Ulcerative colitis [facility])     Past Surgical History:  Procedure Laterality Date  . APPENDECTOMY    . CARDIAC CATHETERIZATION  1990's X 3  . CHOLECYSTECTOMY    . CORONARY ANGIOPLASTY WITH STENT PLACEMENT  05/2014   "2"  . LEFT HEART CATH AND  CORONARY ANGIOGRAPHY N/A 05/25/2020   Procedure: LEFT HEART CATH AND CORONARY ANGIOGRAPHY;  Surgeon: Martinique, Peter M, MD;  Location: [place] [facility];  Service: Cardiovascular;  Laterality: N/A;  . TUMOR EXCISION Right ~ 1999   "side of my upper head"    Family History  Problem Relation Age of Onset  . CAD Father   . Lung cancer Brother        smoked  . Cancer Brother        lung  . Leukemia Sister   . Dementia Sister   . Stroke Mother   . Emphysema Sister   . Cancer Brother        lung   Social History:  reports that he has been smoking cigarettes. He started smoking about 54 years ago. He has a 72.00 pack-year smoking history. He has never used smokeless tobacco. He reports previous alcohol use. He reports that he does not use drugs.  Allergies:  Allergies  Allergen Reactions  . Gabapentin Anxiety    Unknown reaction  . Metformin And Related Rash    Medications Prior to Admission  Medication Sig Dispense Refill  . albuterol (VENTOLIN HFA) 108 (90 Base) MCG/ACT inhaler INHALE 2 PUFFS EVERY 6 HOURS AS NEEEDED (Patient taking differently: Inhale 2 puffs into the lungs every 6 (six) hours as needed for wheezing or shortness of breath.) 6.7 g  0  . ALPRAZolam (XANAX) 0.5 MG tablet Take 1 tablet (0.5 mg total) by mouth 2 (two) times daily as needed. for anxiety 60 tablet 2  . amLODipine (NORVASC) 5 MG tablet Take 1 tablet (5 mg total) by mouth daily. 90 tablet 1  . Ascorbic Acid (VITAMIN C) 1000 MG tablet Take 1,000 mg by mouth daily.    Marland Kitchen aspirin EC 81 MG tablet Take 1 tablet (81 mg total) by mouth daily.    Marland Kitchen atorvastatin (LIPITOR) 40 MG tablet Take 1 tablet (40 mg total) by mouth every evening. 90 tablet 1  . B Complex-C (B-COMPLEX WITH VITAMIN C) tablet Take 1 tablet by mouth daily.    . Blood Glucose Monitoring Suppl (ACCU-CHEK GUIDE ME) w/Device KIT Check BS daily Dx E11.9 1 kit 0  . Budeson-Glycopyrrol-Formoterol (BREZTRI AEROSPHERE) 160-9-4.8 MCG/ACT AERO Inhale 2 puffs  into the lungs in the morning and at bedtime. 10.7 g 11  . Cholecalciferol (VITAMIN D) 50 MCG (2000 UT) tablet Take 2,000 Units by mouth daily.    . Cholecalciferol (VITAMIN D3) 10 MCG (400 UNIT) CAPS Take 400 Units by mouth daily.    . cholecalciferol (VITAMIN D3) 25 MCG (1000 UNIT) tablet Take 1,000 Units by mouth daily.    Marland Kitchen escitalopram (LEXAPRO) 20 MG tablet Take 1 tablet (20 mg total) by mouth daily. 90 tablet 1  . furosemide (LASIX) 20 MG tablet Take 1 tablet (20 mg total) by mouth 2 (two) times daily. 60 tablet 5  . glucose blood (ONE TOUCH ULTRA TEST) test strip USE TO CHECK GLUCOSE ONCE DAILY 100 each 3  . hydrOXYzine (VISTARIL) 25 MG capsule Take 1 capsule (25 mg total) by mouth 3 (three) times daily as needed for anxiety. 90 capsule 1  . ipratropium-albuterol (DUONEB) 0.5-2.5 (3) MG/3ML SOLN USE ONE VIAL BY NEBULIZER FOUR TIMES DAILY (Patient taking differently: Inhale 3 mLs into the lungs every 6 (six) hours as needed (asthma).) 360 mL 3  . isosorbide mononitrate (IMDUR) 30 MG 24 hr tablet Take 1 tablet (30 mg total) by mouth daily. 90 tablet 1  . Lancets (ONETOUCH ULTRASOFT) lancets Use as instructed   DX E11.9 100 each 12  . losartan (COZAAR) 100 MG tablet Take 100 mg by mouth daily.    . metoprolol tartrate (LOPRESSOR) 50 MG tablet Take 1 tablet (50 mg total) by mouth 2 (two) times daily. 180 tablet 1  . mirtazapine (REMERON) 15 MG tablet TAKE 1 TABLET BY MOUTH AT BEDTIME 60 tablet 2  . nicotine (NICODERM CQ - DOSED IN MG/24 HOURS) 14 mg/24hr patch Place 1 patch (14 mg total) onto the skin daily. 90 patch 1  . nitroGLYCERIN (NITROSTAT) 0.4 MG SL tablet DISSOLVE TAKE 1 TABLET UNDER THE TONGUE EVERY FIVE MINUTES AS NEEDED FOR CHEST PAIN (Patient taking differently: Place 0.4 mg under the tongue every 5 (five) minutes as needed for chest pain.) 25 tablet 5  . pantoprazole (PROTONIX) 40 MG tablet Take 1 tablet (40 mg total) by mouth daily. 90 tablet 1  . potassium chloride SA (KLOR-CON)  20 MEQ tablet Take 1 tablet (20 mEq total) by mouth 3 (three) times daily. 60 tablet 2  . Zinc 50 MG TABS Take 50 mg by mouth in the morning and at bedtime.      Results for orders placed or performed during the hospital encounter of 07/20/20 (from the past 48 hour(s))  Glucose, capillary     Status: Abnormal   Collection Time: 07/20/20 11:17 AM  Result  Value Ref Range   Glucose-Capillary 130 (H) 70 - 99 mg/dL    Comment: Glucose reference range applies only to samples taken after fasting for at least 8 hours.   No results found.  Review of Systems  Constitutional: Negative.   HENT: Positive for trouble swallowing.   Eyes: Negative.   Respiratory: Negative.   Cardiovascular: Negative.   Gastrointestinal: Positive for constipation and diarrhea.  Endocrine: Negative.   Genitourinary: Negative.   Musculoskeletal: Negative.   Skin: Negative.   Allergic/Immunologic: Negative.   Neurological: Negative.   Hematological: Negative.   Psychiatric/Behavioral: Negative.     Blood pressure (!) 173/81, temperature 97.7 F (36.5 C), temperature source Oral, resp. rate 19, SpO2 94 %. Physical Exam  GENERAL: The patient is AO x3, in no acute distress.   HEENT: Head is normocephalic and atraumatic. EOMI are intact. Mouth is well hydrated and without lesions. NECK: Supple. No masses LUNGS: Clear to auscultation. No presence of rhonchi/wheezing/rales. Adequate chest expansion HEART: RRR, normal s1 and s2. ABDOMEN: Soft, nontender, no guarding, no peritoneal signs, and nondistended. BS +. No masses. EXTREMITIES: Without any cyanosis, clubbing, rash, lesions or edema. NEUROLOGIC: AOx3, no focal motor deficit. SKIN: no jaundice, no rashes  Assessment/Plan  Ronald [name] is a 75 y.o. male with PMH COPD on oxygen at home, depression,CAD, GERD, NSTEMI s/p stent, stroke and TIA, ulcerative colitis, who presents to the hospital for evaluation of his head to colitis, dysphagia and chronic diarrhea.   We will proceed with EGD with possible esophageal dilation and colonoscopy with possible biopsies for surveillance of esophageal colitis.Harvel Quale, MD 07/20/2020, 11:44 AM

## 2020-07-20 NOTE — Anesthesia Preprocedure Evaluation (Addendum)
Anesthesia Evaluation  Patient identified by MRN, date of birth, ID band Patient awake    Reviewed: Allergy & Precautions, H&P , NPO status , Patient's Chart, lab work & pertinent test results, reviewed documented beta blocker date and time   Airway Mallampati: II  TM Distance: >3 FB Neck ROM: full    Dental no notable dental hx. (+) Dental Advisory Given, Edentulous Upper, Edentulous Lower   Pulmonary asthma , sleep apnea , COPD, Current Smoker and Patient abstained from smoking.,    Pulmonary exam normal breath sounds clear to auscultation       Cardiovascular Exercise Tolerance: Good hypertension, + CAD, + Past MI, + Cardiac Stents and +CHF   Rhythm:regular Rate:Normal     Neuro/Psych PSYCHIATRIC DISORDERS Anxiety Depression TIACVA, No Residual Symptoms    GI/Hepatic Neg liver ROS, PUD, GERD  Medicated,  Endo/Other  negative endocrine ROSdiabetes  Renal/GU negative Renal ROS  negative genitourinary   Musculoskeletal   Abdominal   Peds  Hematology negative hematology ROS (+)   Anesthesia Other Findings   Reproductive/Obstetrics negative OB ROS                           Anesthesia Physical Anesthesia Plan  ASA: III  Anesthesia Plan: General   Post-op Pain Management:    Induction:   PONV Risk Score and Plan: Propofol infusion  Airway Management Planned:   Additional Equipment:   Intra-op Plan:   Post-operative Plan:   Informed Consent: I have reviewed the patients History and Physical, chart, labs and discussed the procedure including the risks, benefits and alternatives for the proposed anesthesia with the patient or authorized representative who has indicated his/her understanding and acceptance.     Dental Advisory Given  Plan Discussed with: CRNA  Anesthesia Plan Comments:         Anesthesia Quick Evaluation

## 2020-07-20 NOTE — Op Note (Signed)
[facility] Patient Name: Ronald [name] Procedure Date: 07/20/2020 12:51 PM MRN: [id number] Date of Birth: [birth year] Attending MD: Maylon Peppers ,  CSN: [id number] Age: 75 Admit Type: Outpatient Procedure:                Colonoscopy Indications:              Ulcerative colitis Providers:                Maylon Peppers, Crystal Page, Randa Spike,                            Technician Referring MD:              Medicines:                Monitored Anesthesia Care Complications:            No immediate complications. Estimated Blood Loss:     Estimated blood loss: none. Procedure:                Pre-Anesthesia Assessment:                           - Prior to the procedure, a History and Physical                            was performed, and patient medications, allergies                            and sensitivities were reviewed. The patient's                            tolerance of previous anesthesia was reviewed.                           - The risks and benefits of the procedure and the                            sedation options and risks were discussed with the                            patient. All questions were answered and informed                            consent was obtained.                           - ASA Grade Assessment: III - A patient with severe                            systemic disease.                           After obtaining informed consent, the colonoscope                            was passed under direct vision. Throughout the  procedure, the patient's blood pressure, pulse, and                            oxygen saturations were monitored continuously. The                            [id number] ([id number]) was introduced through the                            anus and advanced to the the terminal ileum. The                            colonoscopy was performed without difficulty. The                            patient  tolerated the procedure well. Scope                            withdrawal time was 20 minutes. The quality of the                            bowel preparation was adequate to identify polyps. Scope In: 12:53:04 PM Scope Out: 1:33:26 PM Scope Withdrawal Time: 0 hours 37 minutes 4 seconds  Total Procedure Duration: 0 hours 40 minutes 22 seconds  Findings:      Skin tags were found on perianal exam.      The terminal ileum appeared normal.      Inflammation was not found based on the endoscopic appearance of the       mucosa in the colon. This was graded as Mayo Score 0 (normal or inactive       disease). Biopsies from cecum, ascendin colon, transverse colon,       descending colon, sigmoid and rectum were taken with a cold forceps for       histology.      Three sessile polyps were found in the cecum. The polyps were 2 to 3 mm       in size. These polyps were removed with a cold biopsy forceps. Resection       and retrieval were complete.      Five sessile polyps were found in the transverse colon. The polyps were       3 to 5 mm in size. These polyps were removed with a cold snare.       Resection and retrieval were complete.      A 1 mm polyp was found in the rectum. The polyp was sessile. The polyp       was removed with a cold snare. Resection and retrieval were complete.      A few small-mouthed diverticula were found in the sigmoid colon.      Non-bleeding internal hemorrhoids were found during retroflexion. The       hemorrhoids were small. Impression:               - Perianal skin tags found on perianal exam.                           - The examined portion of the ileum was normal.                           -  Inactive (Mayo Score 0) ulcerative colitis.                            Biopsied.                           - Three 2 to 3 mm polyps in the cecum, removed with                            a cold biopsy forceps. Resected and retrieved.                           - Five 3 to 5  mm polyps in the transverse colon,                            removed with a cold snare. Resected and retrieved.                           - One 1 mm polyp in the rectum, removed with a cold                            snare. Resected and retrieved.                           - Diverticulosis in the sigmoid colon.                           - Non-bleeding internal hemorrhoids. Moderate Sedation:      Per Anesthesia Care Recommendation:           - Discharge patient to home (ambulatory).                           - Resume previous diet.                           - Await pathology results.                           - Repeat colonoscopy for surveillance based on                            pathology results, at the very least in 3 years. Procedure Code(s):        --- Professional ---                           [phone], Colonoscopy, flexible; with removal of                            tumor(s), polyp(s), or other lesion(s) by snare                            technique                           92119, 14,  Colonoscopy, flexible; with biopsy,                            single or multiple Diagnosis Code(s):        --- Professional ---                           K64.8, Other hemorrhoids                           K51.90, Ulcerative colitis, unspecified, without                            complications                           K63.5, Polyp of colon                           K62.1, Rectal polyp                           K64.4, Residual hemorrhoidal skin tags                           K57.30, Diverticulosis of large intestine without                            perforation or abscess without bleeding CPT copyright 2019 [employer]. All rights reserved. The codes documented in this report are preliminary and upon coder review may  be revised to meet current compliance requirements. Maylon Peppers, MD Maylon Peppers,  07/20/2020 1:47:22 PM This report has been signed  electronically. Number of Addenda: 0

## 2020-07-20 NOTE — Discharge Instructions (Signed)
You are being discharged to home.  Resume your previous diet.  We are waiting for your pathology results.  Your physician has recommended a repeat colonoscopy for surveillance based on pathology results, , at the very least in 3 years.     Colonoscopy, Adult, Care After This sheet gives you information about how to care for yourself after your procedure. Your doctor may also give you more specific instructions. If you have problems or questions, call your doctor. What can I expect after the procedure? After the procedure, it is common to have:  A small amount of blood in your poop (stool) for 24 hours.  Some gas.  Mild cramping or bloating in your belly (abdomen). Follow these instructions at home: Eating and drinking  Drink enough fluid to keep your pee (urine) pale yellow.  Follow instructions from your doctor about what you cannot eat or drink.  Return to your normal diet as told by your doctor. Avoid heavy or fried foods that are hard to digest.   Activity  Rest as told by your doctor.  Do not sit for a long time without moving. Get up to take short walks every 1-2 hours. This is important. Ask for help if you feel weak or unsteady.  Return to your normal activities as told by your doctor. Ask your doctor what activities are safe for you. To help cramping and bloating:  Try walking around.  Put heat on your belly as told by your doctor. Use the heat source that your doctor recommends, such as a moist heat pack or a heating pad. ? Put a towel between your skin and the heat source. ? Leave the heat on for 20-30 minutes. ? Remove the heat if your skin turns bright red. This is very important if you are unable to feel pain, heat, or cold. You may have a greater risk of getting burned.   General instructions  If you were given a medicine to help you relax (sedative) during your procedure, it can affect you for many hours. Do not drive or use machinery until your doctor says  that it is safe.  For the first 24 hours after the procedure: ? Do not sign important documents. ? Do not drink alcohol. ? Do your daily activities more slowly than normal. ? Eat foods that are soft and easy to digest.  Take over-the-counter or prescription medicines only as told by your doctor.  Keep all follow-up visits as told by your doctor. This is important. Contact a doctor if:  You have blood in your poop 2-3 days after the procedure. Get help right away if:  You have more than a small amount of blood in your poop.  You see large clumps of tissue (blood clots) in your poop.  Your belly is swollen.  You feel like you may vomit (nauseous).  You vomit.  You have a fever.  You have belly pain that gets worse, and medicine does not help your pain. Summary  After the procedure, it is common to have a small amount of blood in your poop. You may also have mild cramping and bloating in your belly.  If you were given a medicine to help you relax (sedative) during your procedure, it can affect you for many hours. Do not drive or use machinery until your doctor says that it is safe.  Get help right away if you have a lot of blood in your poop, feel like you may vomit, have a fever,  or have more belly pain. This information is not intended to replace advice given to you by your health care provider. Make sure you discuss any questions you have with your health care provider. Document Revised: 04/01/2019 Document Reviewed: 12/20/2018 Elsevier Patient Education  [place].    Colon Polyps  Colon polyps are tissue growths inside the colon, which is part of the large intestine. They are one of the types of polyps that can grow in the body. A polyp may be a round bump or a mushroom-shaped growth. You could have one polyp or more than one. Most colon polyps are noncancerous (benign). However, some colon polyps can become cancerous over time. Finding and removing the polyps  early can help prevent this. What are the causes? The exact cause of colon polyps is not known. What increases the risk? The following factors may make you more likely to develop this condition:  Having a family history of colorectal cancer or colon polyps.  Being older than 73 years of age.  Being younger than 73 years of age and having a significant family history of colorectal cancer or colon polyps or a genetic condition that puts you at higher risk of getting colon polyps.  Having inflammatory bowel disease, such as ulcerative colitis or Crohn's disease.  Having certain conditions passed from parent to child (hereditary conditions), such as: ? Familial adenomatous polyposis (FAP). ? Lynch syndrome. ? Turcot syndrome. ? Peutz-Jeghers syndrome. ? MUTYH-associated polyposis (MAP).  Being overweight.  Certain lifestyle factors. These include smoking cigarettes, drinking too much alcohol, not getting enough exercise, and eating a diet that is high in fat and red meat and low in fiber.  Having had childhood cancer that was treated with radiation of the abdomen. What are the signs or symptoms? Many times, there are no symptoms. If you have symptoms, they may include:  Blood coming from the rectum during a bowel movement.  Blood in the stool (feces). The blood may be bright red or very dark in color.  Pain in the abdomen.  A change in bowel habits, such as constipation or diarrhea. How is this diagnosed? This condition is diagnosed with a colonoscopy. This is a procedure in which a lighted, flexible scope is inserted into the opening between the buttocks (anus) and then passed into the colon to examine the area. Polyps are sometimes found when a colonoscopy is done as part of routine cancer screening tests. How is this treated? This condition is treated by removing any polyps that are found. Most polyps can be removed during a colonoscopy. Those polyps will then be tested for  cancer. Additional treatment may be needed depending on the results of testing. Follow these instructions at home: Eating and drinking  Eat foods that are high in fiber, such as fruits, vegetables, and whole grains.  Eat foods that are high in calcium and vitamin D, such as milk, cheese, yogurt, eggs, liver, fish, and broccoli.  Limit foods that are high in fat, such as fried foods and desserts.  Limit the amount of red meat, precooked or cured meat, or other processed meat that you eat, such as hot dogs, sausages, bacon, or meat loaves.  Limit sugary drinks.   Lifestyle  Maintain a healthy weight, or lose weight if recommended by your health care provider.  Exercise every day or as told by your health care provider.  Do not use any products that contain nicotine or tobacco, such as cigarettes, e-cigarettes, and chewing tobacco. If  you need help quitting, ask your health care provider.  Do not drink alcohol if: ? Your health care provider tells you not to drink. ? You are pregnant, may be pregnant, or are planning to become pregnant.  If you drink alcohol: ? Limit how much you use to:  0-1 drink a day for women.  0-2 drinks a day for men. ? Know how much alcohol is in your drink. In the [place] one drink equals one 12 oz bottle of beer (355 mL), one 5 oz glass of wine (148 mL), or one 1 oz glass of hard liquor (44 mL). General instructions  Take over-the-counter and prescription medicines only as told by your health care provider.  Keep all follow-up visits. This is important. This includes having regularly scheduled colonoscopies. Talk to your health care provider about when you need a colonoscopy. Contact a health care provider if:  You have new or worsening bleeding during a bowel movement.  You have new or increased blood in your stool.  You have a change in bowel habits.  You lose weight for no known reason. Summary  Colon polyps are tissue growths inside the  colon, which is part of the large intestine. They are one type of polyp that can grow in the body.  Most colon polyps are noncancerous (benign), but some can become cancerous over time.  This condition is diagnosed with a colonoscopy.  This condition is treated by removing any polyps that are found. Most polyps can be removed during a colonoscopy. This information is not intended to replace advice given to you by your health care provider. Make sure you discuss any questions you have with your health care provider. Document Revised: 09/14/2019 Document Reviewed: 09/14/2019 Elsevier Patient Education  2021 [place].    Upper Endoscopy, Adult Upper endoscopy is a procedure to look inside the upper GI (gastrointestinal) tract. The upper GI tract is made up of:  The part of the body that moves food from your mouth to your stomach (esophagus).  The stomach.  The first part of your small intestine (duodenum). This procedure is also called esophagogastroduodenoscopy (EGD) or gastroscopy. In this procedure, your health care provider passes a thin, flexible tube (endoscope) through your mouth and down your esophagus into your stomach. A small camera is attached to the end of the tube. Images from the camera appear on a monitor in the exam room. During this procedure, your health care provider may also remove a small piece of tissue to be sent to a lab and examined under a microscope (biopsy). Your health care provider may do an upper endoscopy to diagnose cancers of the upper GI tract. You may also have this procedure to find the cause of other conditions, such as:  Stomach pain.  Heartburn.  Pain or problems when swallowing.  Nausea and vomiting.  Stomach bleeding.  Stomach ulcers. Tell a health care provider about:  Any allergies you have.  All medicines you are taking, including vitamins, herbs, eye drops, creams, and over-the-counter medicines.  Any problems you or family  members have had with anesthetic medicines.  Any blood disorders you have.  Any surgeries you have had.  Any medical conditions you have.  Whether you are pregnant or may be pregnant. What are the risks? Generally, this is a safe procedure. However, problems may occur, including:  Infection.  Bleeding.  Allergic reactions to medicines.  A tear or hole (perforation) in the esophagus, stomach, or duodenum. What happens before the  procedure? Staying hydrated Follow instructions from your health care provider about hydration, which may include:  Up to 2 hours before the procedure - you may continue to drink clear liquids, such as water, clear fruit juice, black coffee, and plain tea.   Eating and drinking restrictions Follow instructions from your health care provider about eating and drinking, which may include:  8 hours before the procedure - stop eating heavy meals or foods, such as meat, fried foods, or fatty foods.  6 hours before the procedure - stop eating light meals or foods, such as toast or cereal.  6 hours before the procedure - stop drinking milk or drinks that contain milk.  2 hours before the procedure - stop drinking clear liquids. Medicines Ask your health care provider about:  Changing or stopping your regular medicines. This is especially important if you are taking diabetes medicines or blood thinners.  Taking medicines such as aspirin and ibuprofen. These medicines can thin your blood. Do not take these medicines unless your health care provider tells you to take them.  Taking over-the-counter medicines, vitamins, herbs, and supplements. General instructions  Plan to have someone take you home from the hospital or clinic.  If you will be going home right after the procedure, plan to have someone with you for 24 hours.  Ask your health care provider what steps will be taken to help prevent infection. What happens during the procedure?  An IV will be  inserted into one of your veins.  You may be given one or more of the following: ? A medicine to help you relax (sedative). ? A medicine to numb the throat (local anesthetic).  You will lie on your left side on an exam table.  Your health care provider will pass the endoscope through your mouth and down your esophagus.  Your health care provider will use the scope to check the inside of your esophagus, stomach, and duodenum. Biopsies may be taken.  The endoscope will be removed. The procedure may vary among health care providers and hospitals.   What happens after the procedure?  Your blood pressure, heart rate, breathing rate, and blood oxygen level will be monitored until you leave the hospital or clinic.  Do not drive for 24 hours if you were given a sedative during your procedure.  When your throat is no longer numb, you may be given some fluids to drink.  It is up to you to get the results of your procedure. Ask your health care provider, or the department that is doing the procedure, when your results will be ready. Summary  Upper endoscopy is a procedure to look inside the upper GI tract.  During the procedure, an IV will be inserted into one of your veins. You may be given a medicine to help you relax.  A medicine will be used to numb your throat.  The endoscope will be passed through your mouth and down your esophagus. This information is not intended to replace advice given to you by your health care provider. Make sure you discuss any questions you have with your health care provider. Document Revised: 11/18/2017 Document Reviewed: 10/26/2017 Elsevier Patient Education  2021 [employer].

## 2020-07-20 NOTE — Anesthesia Procedure Notes (Signed)
Date/Time: 07/20/2020 12:40 PM Performed by: Orlie Dakin, CRNA Pre-anesthesia Checklist: Patient identified, Emergency Drugs available, Patient being monitored and Suction available Patient Re-evaluated:Patient Re-evaluated prior to induction Oxygen Delivery Method: Nasal cannula Induction Type: IV induction Placement Confirmation: positive ETCO2

## 2020-07-23 ENCOUNTER — Other Ambulatory Visit (INDEPENDENT_AMBULATORY_CARE_PROVIDER_SITE_OTHER): Payer: Self-pay | Admitting: Gastroenterology

## 2020-07-23 DIAGNOSIS — B3781 Candidal esophagitis: Secondary | ICD-10-CM

## 2020-07-23 LAB — SURGICAL PATHOLOGY

## 2020-07-23 MED ORDER — FLUCONAZOLE 100 MG PO TABS: 200.0000 mg | ORAL_TABLET | Freq: Every day | ORAL | 0 refills | Status: AC

## 2020-07-24 ENCOUNTER — Encounter (INDEPENDENT_AMBULATORY_CARE_PROVIDER_SITE_OTHER): Payer: Self-pay | Admitting: *Deleted

## 2020-07-25 ENCOUNTER — Encounter (HOSPITAL_COMMUNITY): Payer: Self-pay | Admitting: Gastroenterology

## 2020-07-31 ENCOUNTER — Telehealth: Payer: Self-pay

## 2020-07-31 DIAGNOSIS — N3941 Urge incontinence: Secondary | ICD-10-CM

## 2020-07-31 NOTE — Telephone Encounter (Signed)
Pt is asking for a bedside potty chair. Pt is having accidents due to lasix. Please send to [facility].

## 2020-07-31 NOTE — Telephone Encounter (Signed)
Patient aware and verbalized understanding. Will send when order comes.

## 2020-07-31 NOTE — Telephone Encounter (Signed)
Please fax rx

## 2020-08-06 DIAGNOSIS — J439 Emphysema, unspecified: Secondary | ICD-10-CM | POA: Diagnosis not present

## 2020-08-07 ENCOUNTER — Telehealth: Payer: Self-pay

## 2020-08-07 MED ORDER — BREZTRI AEROSPHERE 160-9-4.8 MCG/ACT IN AERO: 2.0000 | INHALATION_SPRAY | Freq: Two times a day (BID) | RESPIRATORY_TRACT | 0 refills | Status: DC

## 2020-08-07 NOTE — Telephone Encounter (Signed)
Done

## 2020-08-07 NOTE — Telephone Encounter (Signed)
  Prescription Request  08/07/2020  What is the name of the medication or equipment? Needs a RX for bedside potty chair. Called last week and requested one for [place] and they are not in his network  Have you contacted your pharmacy to request a refill? (if applicable) NO  Which pharmacy would you like this sent to? [facility]   Patient notified that their request is being sent to the clinical staff for review and that they should receive a response within 2 business days.

## 2020-08-07 NOTE — Telephone Encounter (Signed)
Could you please help and fax over this information for patient.

## 2020-08-07 NOTE — Telephone Encounter (Addendum)
Order for bedside commode faxed to [place] left up front

## 2020-08-07 NOTE — Addendum Note (Signed)
Addended by: Antonietta Barcelona D on: 08/07/2020 03:38 PM   Modules accepted: Orders

## 2020-08-07 NOTE — Telephone Encounter (Signed)
[employer] called stating that they received order for Bedside Commode for pt but they do not have patient in their system so they need [place] to fax them patient demographic info (name, dob, phone #, insurance info)  Fax# [phone]

## 2020-08-08 NOTE — Telephone Encounter (Signed)
Gave pt name & numbers to [facility] here in [place], if they do not take his insurance they may have to call the insurance company to get the name of a DME supplie within network

## 2020-08-08 NOTE — Telephone Encounter (Signed)
Pt called and said that [employer] and [employer] are both out of network for bedside commode. Pt wants to know where else he can get one

## 2020-08-17 ENCOUNTER — Ambulatory Visit: Payer: Medicare HMO | Admitting: Family Medicine

## 2020-08-24 ENCOUNTER — Ambulatory Visit: Payer: Medicare HMO | Admitting: Family

## 2020-08-30 ENCOUNTER — Ambulatory Visit (INDEPENDENT_AMBULATORY_CARE_PROVIDER_SITE_OTHER): Payer: Medicare HMO | Admitting: Family

## 2020-08-30 ENCOUNTER — Encounter: Payer: Self-pay | Admitting: Family

## 2020-08-30 DIAGNOSIS — R399 Unspecified symptoms and signs involving the genitourinary system: Secondary | ICD-10-CM | POA: Diagnosis not present

## 2020-08-30 DIAGNOSIS — R350 Frequency of micturition: Secondary | ICD-10-CM

## 2020-08-30 MED ORDER — SULFAMETHOXAZOLE-TRIMETHOPRIM 800-160 MG PO TABS: 1.0000 | ORAL_TABLET | Freq: Two times a day (BID) | ORAL | 0 refills | Status: DC

## 2020-08-30 NOTE — Progress Notes (Signed)
   Virtual Visit via telephone Note Due to COVID-19 pandemic this visit was conducted virtually. This visit type was conducted due to national recommendations for restrictions regarding the COVID-19 Pandemic (e.g. social distancing, sheltering in place) in an effort to limit this patient's exposure and mitigate transmission in our community. All issues noted in this document were discussed and addressed.  A physical exam was not performed with this format.  I connected with Ronald [name] on 08/30/20 at 4:40 pm  by telephone and verified that I am speaking with the correct person using two identifiers. Ronald [name] is currently located at home and ex wife is currently with him  during visit. The provider, [name], FNP is located in their office at time of visit.  I discussed the limitations, risks, security and privacy concerns of performing an evaluation and management service by telephone and the availability of in person appointments. I also discussed with the patient that there may be a patient responsible charge related to this service. The patient expressed understanding and agreed to proceed.   History and Present Illness:  Urinary Frequency  This is a new problem. The current episode started 1 to 4 weeks ago. The problem occurs intermittently. The problem has been gradually worsening. The pain is at a severity of 0/10. The patient is experiencing no pain. Associated symptoms include frequency, hesitancy, nausea and urgency. Pertinent negatives include no hematuria or vomiting. He has tried increased fluids for the symptoms. The treatment provided mild relief.      Review of Systems  Gastrointestinal: Positive for nausea. Negative for vomiting.  Genitourinary: Positive for frequency, hesitancy and urgency. Negative for hematuria.     Observations/Objective: No SOB or distress noted, hoarse voice  Assessment and Plan: 1. UTI symptoms Force fluids AZO over the counter X2  days RTO prn Culture pending - sulfamethoxazole-trimethoprim (BACTRIM DS) 800-160 MG tablet; Take 1 tablet by mouth 2 (two) times daily.  Dispense: 14 tablet; Refill: 0  2. Urinary frequency     I discussed the assessment and treatment plan with the patient. The patient was provided an opportunity to ask questions and all were answered. The patient agreed with the plan and demonstrated an understanding of the instructions.   The patient was advised to call back or seek an in-person evaluation if the symptoms worsen or if the condition fails to improve as anticipated.  The above assessment and management plan was discussed with the patient. The patient verbalized understanding of and has agreed to the management plan. Patient is aware to call the clinic if symptoms persist or worsen. Patient is aware when to return to the clinic for a follow-up visit. Patient educated on when it is appropriate to go to the emergency department.   Time call ended:  4:51 pm   I provided 11 minutes of non-face-to-face time during this encounter.    [name], FNP

## 2020-08-31 ENCOUNTER — Ambulatory Visit: Payer: Medicare HMO | Admitting: Family

## 2020-09-03 ENCOUNTER — Ambulatory Visit: Payer: Medicare HMO | Admitting: Family

## 2020-09-03 DIAGNOSIS — J439 Emphysema, unspecified: Secondary | ICD-10-CM | POA: Diagnosis not present

## 2020-09-05 ENCOUNTER — Telehealth: Payer: Self-pay

## 2020-09-05 MED ORDER — BREZTRI AEROSPHERE 160-9-4.8 MCG/ACT IN AERO: 2.0000 | INHALATION_SPRAY | Freq: Two times a day (BID) | RESPIRATORY_TRACT | 0 refills | Status: DC

## 2020-09-05 NOTE — Telephone Encounter (Signed)
Aware sample at front desk

## 2020-09-12 ENCOUNTER — Ambulatory Visit (INDEPENDENT_AMBULATORY_CARE_PROVIDER_SITE_OTHER): Payer: Medicare HMO

## 2020-09-12 ENCOUNTER — Encounter: Payer: Self-pay | Admitting: Family

## 2020-09-12 ENCOUNTER — Ambulatory Visit (INDEPENDENT_AMBULATORY_CARE_PROVIDER_SITE_OTHER): Payer: Medicare HMO | Admitting: Family

## 2020-09-12 ENCOUNTER — Other Ambulatory Visit: Payer: Self-pay

## 2020-09-12 VITALS — BP 161/84 | HR 73 | Temp 96.8°F | Ht 69.0 in | Wt 194.8 lb

## 2020-09-12 DIAGNOSIS — E1159 Type 2 diabetes mellitus with other circulatory complications: Secondary | ICD-10-CM | POA: Diagnosis not present

## 2020-09-12 DIAGNOSIS — Z9119 Patient's noncompliance with other medical treatment and regimen: Secondary | ICD-10-CM | POA: Diagnosis not present

## 2020-09-12 DIAGNOSIS — Z720 Tobacco use: Secondary | ICD-10-CM | POA: Diagnosis not present

## 2020-09-12 DIAGNOSIS — K219 Gastro-esophageal reflux disease without esophagitis: Secondary | ICD-10-CM

## 2020-09-12 DIAGNOSIS — F331 Major depressive disorder, recurrent, moderate: Secondary | ICD-10-CM

## 2020-09-12 DIAGNOSIS — I700 Atherosclerosis of aorta: Secondary | ICD-10-CM | POA: Diagnosis not present

## 2020-09-12 DIAGNOSIS — F1320 Sedative, hypnotic or anxiolytic dependence, uncomplicated: Secondary | ICD-10-CM

## 2020-09-12 DIAGNOSIS — I152 Hypertension secondary to endocrine disorders: Secondary | ICD-10-CM

## 2020-09-12 DIAGNOSIS — J449 Chronic obstructive pulmonary disease, unspecified: Secondary | ICD-10-CM

## 2020-09-12 DIAGNOSIS — Z23 Encounter for immunization: Secondary | ICD-10-CM

## 2020-09-12 DIAGNOSIS — Z1211 Encounter for screening for malignant neoplasm of colon: Secondary | ICD-10-CM

## 2020-09-12 DIAGNOSIS — E1142 Type 2 diabetes mellitus with diabetic polyneuropathy: Secondary | ICD-10-CM | POA: Diagnosis not present

## 2020-09-12 DIAGNOSIS — I2510 Atherosclerotic heart disease of native coronary artery without angina pectoris: Secondary | ICD-10-CM | POA: Diagnosis not present

## 2020-09-12 DIAGNOSIS — I509 Heart failure, unspecified: Secondary | ICD-10-CM | POA: Diagnosis not present

## 2020-09-12 DIAGNOSIS — Z79899 Other long term (current) drug therapy: Secondary | ICD-10-CM

## 2020-09-12 DIAGNOSIS — Z91199 Patient's noncompliance with other medical treatment and regimen due to unspecified reason: Secondary | ICD-10-CM

## 2020-09-12 DIAGNOSIS — J439 Emphysema, unspecified: Secondary | ICD-10-CM | POA: Diagnosis not present

## 2020-09-12 DIAGNOSIS — F419 Anxiety disorder, unspecified: Secondary | ICD-10-CM

## 2020-09-12 DIAGNOSIS — R69 Illness, unspecified: Secondary | ICD-10-CM | POA: Diagnosis not present

## 2020-09-12 LAB — BAYER DCA HB A1C WAIVED: HB A1C (BAYER DCA - WAIVED): 6.8 % (ref <7.0)

## 2020-09-12 MED ORDER — ALPRAZOLAM 0.5 MG PO TABS: 0.5000 mg | ORAL_TABLET | Freq: Two times a day (BID) | ORAL | 2 refills | Status: DC | PRN

## 2020-09-12 MED ORDER — DOXYCYCLINE HYCLATE 100 MG PO TABS: 100.0000 mg | ORAL_TABLET | Freq: Two times a day (BID) | ORAL | 0 refills | Status: DC

## 2020-09-12 MED ORDER — PREDNISONE 10 MG (21) PO TBPK: ORAL_TABLET | ORAL | 0 refills | Status: DC

## 2020-09-12 NOTE — Patient Instructions (Signed)

## 2020-09-12 NOTE — Progress Notes (Signed)
Subjective:    Patient ID: Ronald [name], male    DOB: 31-Aug-1945, 75 y.o.   MRN: [id number]  Chief Complaint  Patient presents with  . Medical Management of Chronic Issues  . COPD    Wants trelegy inhaler     PT presents to the office today for chronic follow up. PT is followed by Cardiologists. Pt's BP is elevated today. He is noncompliant with his medications and appointments. He can not read and this does make his medication adherence difficult at times. His ex-wife does his medications for him. He is followed by ENT for hx of trach. He is followed by Pulmonologist for COPD.   He has gained 17 lbs since 03/06/20. His ex-wife states he has increased his food intake, but feels it is a mix of fluid and increase food intake.  COPD He complains of cough and shortness of breath. This is a chronic problem. The current episode started more than 1 year ago. The problem occurs intermittently. The problem has been waxing and waning. Associated symptoms include malaise/fatigue. Pertinent negatives include no dyspnea on exertion, ear congestion or sore throat. He reports moderate improvement on treatment. His past medical history is significant for COPD.  Diabetes He presents for his follow-up diabetic visit. He has type 2 diabetes mellitus. His disease course has been stable. Hypoglycemia symptoms include nervousness/anxiousness. Associated symptoms include blurred vision and foot paresthesias. Symptoms are stable. Diabetic complications include heart disease, nephropathy and peripheral neuropathy. Risk factors for coronary artery disease include dyslipidemia, diabetes mellitus, male sex, hypertension and sedentary lifestyle. He is following a generally unhealthy diet. (Does not check his BS at home)  Hypertension This is a chronic problem. The current episode started more than 1 year ago. The problem has been waxing and waning since onset. The problem is uncontrolled. Associated symptoms include  anxiety, blurred vision, malaise/fatigue, peripheral edema and shortness of breath. Risk factors for coronary artery disease include obesity, male gender, sedentary lifestyle, smoking/tobacco exposure, dyslipidemia and diabetes mellitus. The current treatment provides moderate improvement.  Depression        This is a chronic problem.  The current episode started more than 1 year ago.   The problem occurs intermittently.  Associated symptoms include irritable, restlessness, decreased interest and sad.  Associated symptoms include no helplessness and no hopelessness.  Past medical history includes anxiety.   Anxiety Presents for follow-up visit. Symptoms include depressed mood, excessive worry, irritability, nervous/anxious behavior, restlessness and shortness of breath. Symptoms occur most days. The severity of symptoms is moderate. The quality of sleep is good.        Review of Systems  Constitutional: Positive for irritability and malaise/fatigue.  HENT: Negative for sore throat.   Eyes: Positive for blurred vision.  Respiratory: Positive for cough and shortness of breath.   Cardiovascular: Negative for dyspnea on exertion.  Psychiatric/Behavioral: Positive for depression. The patient is nervous/anxious.   All other systems reviewed and are negative.      Objective:   Physical Exam Vitals reviewed.  Constitutional:      General: He is irritable. He is not in acute distress.    Appearance: He is well-developed.  HENT:     Head: Normocephalic.     Right Ear: Tympanic membrane normal.     Left Ear: Tympanic membrane normal.  Eyes:     General:        Right eye: No discharge.        Left eye: No discharge.  Pupils: Pupils are equal, round, and reactive to light.  Neck:     Thyroid: No thyromegaly.  Cardiovascular:     Rate and Rhythm: Normal rate and regular rhythm.     Heart sounds: Normal heart sounds. No murmur heard.   Pulmonary:     Effort: Pulmonary effort is  normal. No respiratory distress.     Breath sounds: Wheezing and rhonchi present.  Abdominal:     General: Bowel sounds are normal. There is no distension.     Palpations: Abdomen is soft.     Tenderness: There is no abdominal tenderness.  Musculoskeletal:        General: Swelling present. No tenderness.     Cervical back: Normal range of motion and neck supple.     Comments: 3+ swelling in bilateral arms  Skin:    General: Skin is warm and dry.     Findings: No erythema or rash.  Neurological:     Mental Status: He is alert and oriented to person, place, and time.     Cranial Nerves: No cranial nerve deficit.     Deep Tendon Reflexes: Reflexes are normal and symmetric.  Psychiatric:        Behavior: Behavior normal.        Thought Content: Thought content normal.        Judgment: Judgment normal.       BP (!) 161/84   Pulse 73   Temp (!) 96.8 F (36 C) (Temporal)   Ht 5' 9"  (1.753 m)   Wt 194 lb 12.8 oz (88.4 kg)   SpO2 93%   BMI 28.77 kg/m      Assessment & Plan:  COSTA JHA comes in today with chief complaint of Medical Management of Chronic Issues and COPD (Wants trelegy inhaler /)   Diagnosis and orders addressed:  1. CAD in native artery - CMP14+EGFR - CBC with Differential/Platelet  2. Hypertension associated with diabetes ([place]) - CMP14+EGFR - CBC with Differential/Platelet  3. Aortic atherosclerosis (HCC) - CMP14+EGFR - CBC with Differential/Platelet  4. COPD GOLD 3 / still smoking - For home use only DME oxygen - CMP14+EGFR - CBC with Differential/Platelet - DG Chest 2 View; Future - doxycycline (VIBRA-TABS) 100 MG tablet; Take 1 tablet (100 mg total) by mouth 2 (two) times daily.  Dispense: 20 tablet; Refill: 0 - predniSONE (STERAPRED UNI-PAK 21 TAB) 10 MG (21) TBPK tablet; Use as directed  Dispense: 21 tablet; Refill: 0  5. Gastroesophageal reflux disease, unspecified whether esophagitis present - CMP14+EGFR - CBC with  Differential/Platelet  6. Type 2 diabetes mellitus with diabetic polyneuropathy, without long-term current use of insulin (HCC) - CMP14+EGFR - CBC with Differential/Platelet - Bayer DCA Hb A1c Waived  7. Tobacco abuse - CMP14+EGFR - CBC with Differential/Platelet  8. Anxiety - ALPRAZolam (XANAX) 0.5 MG tablet; Take 1 tablet (0.5 mg total) by mouth 2 (two) times daily as needed. for anxiety  Dispense: 60 tablet; Refill: 2 - CMP14+EGFR - CBC with Differential/Platelet  9. Moderate episode of recurrent major depressive disorder (HCC - CMP14+EGFR - CBC with Differential/Platelet  10. Noncompliance - CMP14+EGFR - CBC with Differential/Platelet  11. Benzodiazepine dependence (HCC) - ALPRAZolam (XANAX) 0.5 MG tablet; Take 1 tablet (0.5 mg total) by mouth 2 (two) times daily as needed. for anxiety  Dispense: 60 tablet; Refill: 2 - CMP14+EGFR - CBC with Differential/Platelet  12. Controlled substance agreement signed - CMP14+EGFR - CBC with Differential/Platelet  13. Congestive heart failure, unspecified HF chronicity, unspecified  heart failure type (HCC) - Brain natriuretic peptide  14. Colon cancer screening - Cologuard   Labs pending Patient reviewed in [place] controlled database, no flags noted. Contract and drug screen are up to date.  Health Maintenance reviewed Diet and exercise encouraged  Follow up plan: 1 months    [name], FNP

## 2020-09-13 LAB — CBC WITH DIFFERENTIAL/PLATELET
Basophils Absolute: 0.1 10*3/uL (ref 0.0–0.2)
Basos: 1 %
EOS (ABSOLUTE): 0.1 10*3/uL (ref 0.0–0.4)
Eos: 1 %
Hematocrit: 46.6 % (ref 37.5–51.0)
Hemoglobin: 16 g/dL (ref 13.0–17.7)
Immature Grans (Abs): 0.1 10*3/uL (ref 0.0–0.1)
Immature Granulocytes: 1 %
Lymphocytes Absolute: 1.8 10*3/uL (ref 0.7–3.1)
Lymphs: 17 %
MCH: 28.9 pg (ref 26.6–33.0)
MCHC: 34.3 g/dL (ref 31.5–35.7)
MCV: 84 fL (ref 79–97)
Monocytes Absolute: 1.2 10*3/uL — ABNORMAL HIGH (ref 0.1–0.9)
Monocytes: 11 %
Neutrophils Absolute: 7.2 10*3/uL — ABNORMAL HIGH (ref 1.4–7.0)
Neutrophils: 69 %
Platelets: 433 10*3/uL (ref 150–450)
RBC: 5.54 x10E6/uL (ref 4.14–5.80)
RDW: 12.4 % (ref 11.6–15.4)
WBC: 10.5 10*3/uL (ref 3.4–10.8)

## 2020-09-13 LAB — CMP14+EGFR
ALT: 16 IU/L (ref 0–44)
AST: 13 IU/L (ref 0–40)
Albumin/Globulin Ratio: 1.4 (ref 1.2–2.2)
Albumin: 4 g/dL (ref 3.7–4.7)
Alkaline Phosphatase: 132 IU/L — ABNORMAL HIGH (ref 44–121)
BUN/Creatinine Ratio: 13 (ref 10–24)
BUN: 13 mg/dL (ref 8–27)
Bilirubin Total: 0.4 mg/dL (ref 0.0–1.2)
CO2: 28 mmol/L (ref 20–29)
Calcium: 9.4 mg/dL (ref 8.6–10.2)
Chloride: 98 mmol/L (ref 96–106)
Creatinine, Ser: 0.98 mg/dL (ref 0.76–1.27)
Globulin, Total: 2.8 g/dL (ref 1.5–4.5)
Glucose: 141 mg/dL — ABNORMAL HIGH (ref 65–99)
Potassium: 4.5 mmol/L (ref 3.5–5.2)
Sodium: 143 mmol/L (ref 134–144)
Total Protein: 6.8 g/dL (ref 6.0–8.5)
eGFR: 82 mL/min/{1.73_m2} (ref >59)

## 2020-09-13 LAB — BRAIN NATRIURETIC PEPTIDE: BNP: 289.9 pg/mL — ABNORMAL HIGH (ref 0.0–100.0)

## 2020-09-19 DIAGNOSIS — J449 Chronic obstructive pulmonary disease, unspecified: Secondary | ICD-10-CM | POA: Diagnosis not present

## 2020-09-19 DIAGNOSIS — J383 Other diseases of vocal cords: Secondary | ICD-10-CM | POA: Diagnosis not present

## 2020-09-19 DIAGNOSIS — R0689 Other abnormalities of breathing: Secondary | ICD-10-CM | POA: Diagnosis not present

## 2020-09-19 DIAGNOSIS — L539 Erythematous condition, unspecified: Secondary | ICD-10-CM | POA: Diagnosis not present

## 2020-09-19 DIAGNOSIS — J384 Edema of larynx: Secondary | ICD-10-CM | POA: Diagnosis not present

## 2020-09-19 DIAGNOSIS — R69 Illness, unspecified: Secondary | ICD-10-CM | POA: Diagnosis not present

## 2020-09-19 DIAGNOSIS — J398 Other specified diseases of upper respiratory tract: Secondary | ICD-10-CM | POA: Diagnosis not present

## 2020-09-19 DIAGNOSIS — J9503 Malfunction of tracheostomy stoma: Secondary | ICD-10-CM | POA: Diagnosis not present

## 2020-09-24 ENCOUNTER — Ambulatory Visit: Payer: Medicare HMO | Admitting: Family

## 2020-09-25 ENCOUNTER — Encounter (HOSPITAL_COMMUNITY): Payer: Self-pay | Admitting: *Deleted

## 2020-09-25 ENCOUNTER — Other Ambulatory Visit: Payer: Self-pay | Admitting: Nurse Practitioner

## 2020-09-25 ENCOUNTER — Inpatient Hospital Stay (HOSPITAL_COMMUNITY)
Admission: EM | Admit: 2020-09-25 | Discharge: 2020-09-28 | DRG: 603 | Disposition: A | Discharge status: Home Health | Payer: Medicare HMO | Attending: Internal Medicine | Admitting: Internal Medicine

## 2020-09-25 ENCOUNTER — Other Ambulatory Visit: Payer: Self-pay

## 2020-09-25 ENCOUNTER — Encounter: Payer: Self-pay | Admitting: Family Medicine

## 2020-09-25 ENCOUNTER — Ambulatory Visit (INDEPENDENT_AMBULATORY_CARE_PROVIDER_SITE_OTHER): Payer: Medicare HMO | Admitting: Family Medicine

## 2020-09-25 ENCOUNTER — Emergency Department (HOSPITAL_COMMUNITY): Payer: Medicare HMO

## 2020-09-25 VITALS — BP 123/61 | HR 66 | Temp 97.9°F | Ht 69.0 in | Wt 199.8 lb

## 2020-09-25 DIAGNOSIS — G8929 Other chronic pain: Secondary | ICD-10-CM | POA: Diagnosis present

## 2020-09-25 DIAGNOSIS — J449 Chronic obstructive pulmonary disease, unspecified: Secondary | ICD-10-CM | POA: Diagnosis not present

## 2020-09-25 DIAGNOSIS — M545 Low back pain, unspecified: Secondary | ICD-10-CM | POA: Diagnosis present

## 2020-09-25 DIAGNOSIS — Z8249 Family history of ischemic heart disease and other diseases of the circulatory system: Secondary | ICD-10-CM

## 2020-09-25 DIAGNOSIS — K219 Gastro-esophageal reflux disease without esophagitis: Secondary | ICD-10-CM | POA: Diagnosis not present

## 2020-09-25 DIAGNOSIS — F32A Depression, unspecified: Secondary | ICD-10-CM | POA: Diagnosis present

## 2020-09-25 DIAGNOSIS — I2510 Atherosclerotic heart disease of native coronary artery without angina pectoris: Secondary | ICD-10-CM | POA: Diagnosis present

## 2020-09-25 DIAGNOSIS — E876 Hypokalemia: Secondary | ICD-10-CM | POA: Diagnosis not present

## 2020-09-25 DIAGNOSIS — I110 Hypertensive heart disease with heart failure: Secondary | ICD-10-CM | POA: Diagnosis not present

## 2020-09-25 DIAGNOSIS — E1140 Type 2 diabetes mellitus with diabetic neuropathy, unspecified: Secondary | ICD-10-CM | POA: Diagnosis not present

## 2020-09-25 DIAGNOSIS — F419 Anxiety disorder, unspecified: Secondary | ICD-10-CM | POA: Diagnosis present

## 2020-09-25 DIAGNOSIS — I509 Heart failure, unspecified: Secondary | ICD-10-CM

## 2020-09-25 DIAGNOSIS — Z955 Presence of coronary angioplasty implant and graft: Secondary | ICD-10-CM | POA: Diagnosis not present

## 2020-09-25 DIAGNOSIS — E7800 Pure hypercholesterolemia, unspecified: Secondary | ICD-10-CM | POA: Diagnosis present

## 2020-09-25 DIAGNOSIS — Z825 Family history of asthma and other chronic lower respiratory diseases: Secondary | ICD-10-CM

## 2020-09-25 DIAGNOSIS — I160 Hypertensive urgency: Secondary | ICD-10-CM | POA: Diagnosis present

## 2020-09-25 DIAGNOSIS — R2233 Localized swelling, mass and lump, upper limb, bilateral: Secondary | ICD-10-CM | POA: Diagnosis not present

## 2020-09-25 DIAGNOSIS — L0390 Cellulitis, unspecified: Secondary | ICD-10-CM | POA: Diagnosis present

## 2020-09-25 DIAGNOSIS — J9611 Chronic respiratory failure with hypoxia: Secondary | ICD-10-CM | POA: Diagnosis not present

## 2020-09-25 DIAGNOSIS — L03119 Cellulitis of unspecified part of limb: Secondary | ICD-10-CM

## 2020-09-25 DIAGNOSIS — E785 Hyperlipidemia, unspecified: Secondary | ICD-10-CM | POA: Diagnosis present

## 2020-09-25 DIAGNOSIS — Z7951 Long term (current) use of inhaled steroids: Secondary | ICD-10-CM

## 2020-09-25 DIAGNOSIS — I890 Lymphedema, not elsewhere classified: Secondary | ICD-10-CM

## 2020-09-25 DIAGNOSIS — Z79899 Other long term (current) drug therapy: Secondary | ICD-10-CM | POA: Diagnosis not present

## 2020-09-25 DIAGNOSIS — I5032 Chronic diastolic (congestive) heart failure: Secondary | ICD-10-CM | POA: Diagnosis present

## 2020-09-25 DIAGNOSIS — I872 Venous insufficiency (chronic) (peripheral): Secondary | ICD-10-CM | POA: Diagnosis present

## 2020-09-25 DIAGNOSIS — I10 Essential (primary) hypertension: Secondary | ICD-10-CM | POA: Diagnosis not present

## 2020-09-25 DIAGNOSIS — J439 Emphysema, unspecified: Secondary | ICD-10-CM | POA: Diagnosis not present

## 2020-09-25 DIAGNOSIS — Z806 Family history of leukemia: Secondary | ICD-10-CM

## 2020-09-25 DIAGNOSIS — Z7982 Long term (current) use of aspirin: Secondary | ICD-10-CM | POA: Diagnosis not present

## 2020-09-25 DIAGNOSIS — Z20822 Contact with and (suspected) exposure to covid-19: Secondary | ICD-10-CM | POA: Diagnosis present

## 2020-09-25 DIAGNOSIS — L03113 Cellulitis of right upper limb: Secondary | ICD-10-CM

## 2020-09-25 DIAGNOSIS — F17210 Nicotine dependence, cigarettes, uncomplicated: Secondary | ICD-10-CM | POA: Diagnosis present

## 2020-09-25 DIAGNOSIS — E119 Type 2 diabetes mellitus without complications: Secondary | ICD-10-CM

## 2020-09-25 DIAGNOSIS — I5030 Unspecified diastolic (congestive) heart failure: Secondary | ICD-10-CM

## 2020-09-25 DIAGNOSIS — R600 Localized edema: Secondary | ICD-10-CM | POA: Diagnosis not present

## 2020-09-25 DIAGNOSIS — J441 Chronic obstructive pulmonary disease with (acute) exacerbation: Secondary | ICD-10-CM | POA: Diagnosis present

## 2020-09-25 DIAGNOSIS — L03114 Cellulitis of left upper limb: Principal | ICD-10-CM | POA: Diagnosis present

## 2020-09-25 DIAGNOSIS — G4730 Sleep apnea, unspecified: Secondary | ICD-10-CM | POA: Diagnosis present

## 2020-09-25 DIAGNOSIS — Z823 Family history of stroke: Secondary | ICD-10-CM

## 2020-09-25 DIAGNOSIS — I252 Old myocardial infarction: Secondary | ICD-10-CM

## 2020-09-25 DIAGNOSIS — Z8673 Personal history of transient ischemic attack (TIA), and cerebral infarction without residual deficits: Secondary | ICD-10-CM

## 2020-09-25 DIAGNOSIS — Z801 Family history of malignant neoplasm of trachea, bronchus and lung: Secondary | ICD-10-CM

## 2020-09-25 LAB — CBC WITH DIFFERENTIAL/PLATELET
Abs Immature Granulocytes: 0.09 10*3/uL — ABNORMAL HIGH (ref 0.00–0.07)
Basophils Absolute: 0 10*3/uL (ref 0.0–0.1)
Basophils Relative: 0 %
Eosinophils Absolute: 0.1 10*3/uL (ref 0.0–0.5)
Eosinophils Relative: 1 %
HCT: 45 % (ref 39.0–52.0)
Hemoglobin: 15.1 g/dL (ref 13.0–17.0)
Immature Granulocytes: 1 %
Lymphocytes Relative: 20 %
Lymphs Abs: 2.1 10*3/uL (ref 0.7–4.0)
MCH: 29.1 pg (ref 26.0–34.0)
MCHC: 33.6 g/dL (ref 30.0–36.0)
MCV: 86.7 fL (ref 80.0–100.0)
Monocytes Absolute: 1.1 10*3/uL — ABNORMAL HIGH (ref 0.1–1.0)
Monocytes Relative: 11 %
Neutro Abs: 7.2 10*3/uL (ref 1.7–7.7)
Neutrophils Relative %: 67 %
Platelets: 352 10*3/uL (ref 150–400)
RBC: 5.19 MIL/uL (ref 4.22–5.81)
RDW: 13.2 % (ref 11.5–15.5)
WBC: 10.6 10*3/uL — ABNORMAL HIGH (ref 4.0–10.5)
nRBC: 0 % (ref 0.0–0.2)

## 2020-09-25 LAB — COMPREHENSIVE METABOLIC PANEL
ALT: 17 U/L (ref 0–44)
AST: 14 U/L — ABNORMAL LOW (ref 15–41)
Albumin: 3.4 g/dL — ABNORMAL LOW (ref 3.5–5.0)
Alkaline Phosphatase: 80 U/L (ref 38–126)
Anion gap: 7 (ref 5–15)
BUN: 15 mg/dL (ref 8–23)
CO2: 35 mmol/L — ABNORMAL HIGH (ref 22–32)
Calcium: 8.5 mg/dL — ABNORMAL LOW (ref 8.9–10.3)
Chloride: 97 mmol/L — ABNORMAL LOW (ref 98–111)
Creatinine, Ser: 0.92 mg/dL (ref 0.61–1.24)
GFR, Estimated: >60 mL/min (ref >60)
Glucose, Bld: 125 mg/dL — ABNORMAL HIGH (ref 70–99)
Potassium: 3.8 mmol/L (ref 3.5–5.1)
Sodium: 139 mmol/L (ref 135–145)
Total Bilirubin: 0.6 mg/dL (ref 0.3–1.2)
Total Protein: 6.6 g/dL (ref 6.5–8.1)

## 2020-09-25 LAB — URINALYSIS, ROUTINE W REFLEX MICROSCOPIC
Bilirubin Urine: NEGATIVE
Glucose, UA: NEGATIVE mg/dL
Hgb urine dipstick: NEGATIVE
Ketones, ur: NEGATIVE mg/dL
Leukocytes,Ua: NEGATIVE
Nitrite: NEGATIVE
Protein, ur: NEGATIVE mg/dL
Specific Gravity, Urine: 1.014 (ref 1.005–1.030)
pH: 5 (ref 5.0–8.0)

## 2020-09-25 LAB — BRAIN NATRIURETIC PEPTIDE: B Natriuretic Peptide: 237 pg/mL — ABNORMAL HIGH (ref 0.0–100.0)

## 2020-09-25 LAB — GLUCOSE, CAPILLARY: Glucose-Capillary: 245 mg/dL — ABNORMAL HIGH (ref 70–99)

## 2020-09-25 MED ORDER — ENOXAPARIN SODIUM 40 MG/0.4ML ~~LOC~~ SOLN
40.0000 mg | Freq: Every day | SUBCUTANEOUS | Status: DC
  Administered 2020-09-25 – 2020-09-27 (×3): 40 mg via SUBCUTANEOUS
  Filled 2020-09-25 (×3): qty 0.4

## 2020-09-25 MED ORDER — ESCITALOPRAM OXALATE 10 MG PO TABS
20.0000 mg | ORAL_TABLET | Freq: Every day | ORAL | Status: DC
  Administered 2020-09-26 – 2020-09-28 (×3): 20 mg via ORAL
  Filled 2020-09-25 (×3): qty 2

## 2020-09-25 MED ORDER — PANTOPRAZOLE SODIUM 40 MG PO TBEC
40.0000 mg | DELAYED_RELEASE_TABLET | Freq: Every day | ORAL | Status: DC
  Administered 2020-09-26 – 2020-09-28 (×3): 40 mg via ORAL
  Filled 2020-09-25 (×3): qty 1

## 2020-09-25 MED ORDER — METOPROLOL TARTRATE 50 MG PO TABS
50.0000 mg | ORAL_TABLET | Freq: Two times a day (BID) | ORAL | Status: DC
  Administered 2020-09-25 – 2020-09-28 (×6): 50 mg via ORAL
  Filled 2020-09-25 (×6): qty 1

## 2020-09-25 MED ORDER — AMLODIPINE BESYLATE 5 MG PO TABS
5.0000 mg | ORAL_TABLET | Freq: Every day | ORAL | Status: DC
  Administered 2020-09-26 – 2020-09-28 (×3): 5 mg via ORAL
  Filled 2020-09-25 (×3): qty 1

## 2020-09-25 MED ORDER — UMECLIDINIUM BROMIDE 62.5 MCG/INH IN AEPB
1.0000 | INHALATION_SPRAY | Freq: Every day | RESPIRATORY_TRACT | Status: DC
  Administered 2020-09-26 – 2020-09-28 (×3): 1 via RESPIRATORY_TRACT
  Filled 2020-09-25: qty 7

## 2020-09-25 MED ORDER — FLUTICASONE FUROATE-VILANTEROL 100-25 MCG/INH IN AEPB
1.0000 | INHALATION_SPRAY | Freq: Every day | RESPIRATORY_TRACT | Status: DC
  Administered 2020-09-26 – 2020-09-28 (×3): 1 via RESPIRATORY_TRACT
  Filled 2020-09-25: qty 28

## 2020-09-25 MED ORDER — FUROSEMIDE 20 MG PO TABS
20.0000 mg | ORAL_TABLET | Freq: Two times a day (BID) | ORAL | Status: DC
  Administered 2020-09-26 – 2020-09-28 (×5): 20 mg via ORAL
  Filled 2020-09-25 (×5): qty 1

## 2020-09-25 MED ORDER — LOSARTAN POTASSIUM 50 MG PO TABS
100.0000 mg | ORAL_TABLET | Freq: Every day | ORAL | Status: DC
  Administered 2020-09-26 – 2020-09-28 (×3): 100 mg via ORAL
  Filled 2020-09-25 (×3): qty 2

## 2020-09-25 MED ORDER — FLUTICASONE-UMECLIDIN-VILANT 100-62.5-25 MCG/INH IN AEPB: INHALATION_SPRAY | Freq: Every day | RESPIRATORY_TRACT | Status: DC

## 2020-09-25 MED ORDER — ALPRAZOLAM 0.5 MG PO TABS: 0.5000 mg | ORAL_TABLET | Freq: Two times a day (BID) | ORAL | Status: DC | PRN

## 2020-09-25 MED ORDER — INSULIN ASPART 100 UNIT/ML ~~LOC~~ SOLN
0.0000 [IU] | Freq: Every day | SUBCUTANEOUS | Status: DC
  Administered 2020-09-25: 3 [IU] via SUBCUTANEOUS

## 2020-09-25 MED ORDER — ONDANSETRON HCL 4 MG/2ML IJ SOLN: 4.0000 mg | Freq: Four times a day (QID) | INTRAMUSCULAR | Status: DC | PRN

## 2020-09-25 MED ORDER — POLYETHYLENE GLYCOL 3350 17 G PO PACK: 17.0000 g | PACK | Freq: Every day | ORAL | Status: DC | PRN

## 2020-09-25 MED ORDER — MIRTAZAPINE 15 MG PO TABS
15.0000 mg | ORAL_TABLET | Freq: Every day | ORAL | Status: DC
  Administered 2020-09-25 – 2020-09-27 (×3): 15 mg via ORAL
  Filled 2020-09-25 (×3): qty 1

## 2020-09-25 MED ORDER — ACETAMINOPHEN 650 MG RE SUPP: 650.0000 mg | Freq: Four times a day (QID) | RECTAL | Status: DC | PRN

## 2020-09-25 MED ORDER — IPRATROPIUM-ALBUTEROL 0.5-2.5 (3) MG/3ML IN SOLN: 3.0000 mL | Freq: Four times a day (QID) | RESPIRATORY_TRACT | Status: DC | PRN

## 2020-09-25 MED ORDER — ONDANSETRON HCL 4 MG PO TABS: 4.0000 mg | ORAL_TABLET | Freq: Four times a day (QID) | ORAL | Status: DC | PRN

## 2020-09-25 MED ORDER — INSULIN ASPART 100 UNIT/ML ~~LOC~~ SOLN
0.0000 [IU] | Freq: Three times a day (TID) | SUBCUTANEOUS | Status: DC
  Administered 2020-09-26 (×2): 1 [IU] via SUBCUTANEOUS
  Administered 2020-09-26 – 2020-09-27 (×3): 2 [IU] via SUBCUTANEOUS
  Administered 2020-09-27: 1 [IU] via SUBCUTANEOUS
  Administered 2020-09-28: 2 [IU] via SUBCUTANEOUS

## 2020-09-25 MED ORDER — FUROSEMIDE 20 MG PO TABS: 20.0000 mg | ORAL_TABLET | Freq: Two times a day (BID) | ORAL | Status: DC

## 2020-09-25 MED ORDER — ISOSORBIDE MONONITRATE ER 60 MG PO TB24
30.0000 mg | ORAL_TABLET | Freq: Every day | ORAL | Status: DC
  Administered 2020-09-26 – 2020-09-28 (×3): 30 mg via ORAL
  Filled 2020-09-25 (×2): qty 1

## 2020-09-25 MED ORDER — SODIUM CHLORIDE 0.9 % IV SOLN
1.0000 g | Freq: Every day | INTRAVENOUS | Status: DC
  Administered 2020-09-25: 1 g via INTRAVENOUS
  Filled 2020-09-25: qty 10

## 2020-09-25 MED ORDER — ATORVASTATIN CALCIUM 40 MG PO TABS
40.0000 mg | ORAL_TABLET | Freq: Every evening | ORAL | Status: DC
  Administered 2020-09-25 – 2020-09-27 (×3): 40 mg via ORAL
  Filled 2020-09-25 (×3): qty 1

## 2020-09-25 MED ORDER — CLINDAMYCIN PHOSPHATE 600 MG/50ML IV SOLN
600.0000 mg | Freq: Once | INTRAVENOUS | Status: AC
  Administered 2020-09-25: 600 mg via INTRAVENOUS
  Filled 2020-09-25: qty 50

## 2020-09-25 MED ORDER — ACETAMINOPHEN 325 MG PO TABS: 650.0000 mg | ORAL_TABLET | Freq: Four times a day (QID) | ORAL | Status: DC | PRN

## 2020-09-25 MED ORDER — ASPIRIN EC 81 MG PO TBEC
81.0000 mg | DELAYED_RELEASE_TABLET | Freq: Every day | ORAL | Status: DC
  Administered 2020-09-26 – 2020-09-28 (×3): 81 mg via ORAL
  Filled 2020-09-25 (×3): qty 1

## 2020-09-25 NOTE — Progress Notes (Signed)
I received a telephone call from on-call service to speak to Emergency department PA [place]  from [facility]. Patient's family was calling and very upset that DR Livia Snellen allegedly told them that he  wanted patient transferred to [facility] to the vascular department and not treated at [facility]. based on discussion and conversation at clinic visit this morning. I went over clinic visit summery note with PA and could not find any directions for patients transfer to [facility].

## 2020-09-25 NOTE — Progress Notes (Signed)
Subjective:  Patient ID: Ronald [name], male    DOB: 07-22-1945  Age: 75 y.o. MRN: [id number]  CC: No chief complaint on file.   HPI Ronald [name] presents for swelling in the forearms. Pain prevents him from doing ADLs such as shaving and bathing due to pain from the swelling.  Taking furosemide BID. Not reducing the weight or swelling. Urinating constantly. No edema in the legs, but Ronald [name] remains swollen in the arms. Ronald [name] has been having lymphedema for four years. Unknown cause, but it has never been this bad- this swollen or red. Ronald [name] is a smoker. Ronald [name] has a history of CHF, but denies current dyspnea. Ronald [name] has COPD, but has not had cough, sputum or excessive dyspnea since onset. Ronald [name] has been having some rest pain in the chest recently. Not good at characterizing it, but denies radiation.   Depression screen [facility] 2/9 09/25/2020 09/12/2020 09/12/2020  Decreased Interest 0 0 0  Down, Depressed, Hopeless 3 0 0  PHQ - 2 Score 3 0 0  Altered sleeping 3 0 -  Tired, decreased energy 3 0 -  Change in appetite 3 0 -  Feeling bad or failure about yourself  0 0 -  Trouble concentrating 0 0 -  Moving slowly or fidgety/restless 0 0 -  Suicidal thoughts - 0 -  PHQ-9 Score 12 0 -  Difficult doing work/chores Not difficult at all Not difficult at all -  Some recent data might be hidden    History Ronald [name] has a past medical history of Anxiety, Asthma, Chronic lower back pain, COPD (chronic obstructive pulmonary disease) ([place]), Coronary artery disease, Depression, Educated about COVID-19 virus infection (03/06/2020), GERD (gastroesophageal reflux disease), High cholesterol, Hypertension, NSTEMI (non-ST elevated myocardial infarction) ([place]) (05/2014), Sleep apnea, Stroke ([place]) (2017), TIA (transient ischemic attack), Type II diabetes mellitus ([place]), and Ulcerative colitis ([place]).   Ronald [name] has a past surgical history that includes Appendectomy; Cholecystectomy; Tumor excision (Right, ~ 1999); Coronary angioplasty with stent  (05/2014); Cardiac catheterization (1990's X 3); LEFT HEART CATH AND CORONARY ANGIOGRAPHY (N/A, 05/25/2020); Colonoscopy with propofol (N/A, 07/20/2020); Esophagogastroduodenoscopy (egd) with propofol (N/A, 07/20/2020); Esophageal dilation (N/A, 07/20/2020); biopsy (07/20/2020); and Polypectomy (07/20/2020).   His family history includes CAD in his father; Cancer in his brother and brother; Dementia in his sister; Emphysema in his sister; Leukemia in his sister; Lung cancer in his brother; Stroke in his mother.Ronald [name] reports that Ronald [name] has been smoking cigarettes. Ronald [name] started smoking about 54 years ago. Ronald [name] has a 72.00 pack-year smoking history. Ronald [name] has never used smokeless tobacco. Ronald [name] reports previous alcohol use. Ronald [name] reports that Ronald [name] does not use drugs.    ROS Review of Systems  Constitutional: Positive for activity change (decreased due topain). Negative for fever.  HENT: Negative.   Respiratory: Positive for shortness of breath (at baseliine). Negative for cough and chest tightness.   Cardiovascular: Positive for chest pain. Negative for palpitations and leg swelling.  Gastrointestinal: Negative for abdominal pain, constipation, diarrhea, nausea and vomiting.  Musculoskeletal: Positive for arthralgias and myalgias.  Skin: Positive for rash ( of hands nd forearms).    Objective:  BP 123/61   Pulse 66   Temp 97.9 F (36.6 C)   Ht 5' 9"  (1.753 m)   Wt 199 lb 12.8 oz (90.6 kg)   SpO2 95%   BMI 29.51 kg/m   BP Readings from Last 3 Encounters:  09/25/20 123/61  09/12/20 (!) 161/84  07/20/20 (!) 155/82    Wt Readings from  Last 3 Encounters:  09/25/20 199 lb 12.8 oz (90.6 kg)  09/12/20 194 lb 12.8 oz (88.4 kg)  07/18/20 180 lb (81.6 kg)     Physical Exam Constitutional:      General: Ronald [name] is not in acute distress.    Appearance: Ronald [name] is obese. Ronald [name] is ill-appearing. Ronald [name] is not diaphoretic.  HENT:     Head: Normocephalic.     Right Ear: Tympanic membrane normal.     Left Ear: Tympanic membrane  normal.     Nose: Nose normal.     Mouth/Throat:     Mouth: Mucous membranes are moist.  Eyes:     Extraocular Movements: Extraocular movements intact.     Pupils: Pupils are equal, round, and reactive to light.  Cardiovascular:     Rate and Rhythm: Normal rate and regular rhythm.     Heart sounds: Normal heart sounds.  Pulmonary:     Breath sounds: Normal breath sounds.  Abdominal:     Palpations: Abdomen is soft.     Tenderness: There is no abdominal tenderness.  Musculoskeletal:        General: Tenderness (for deep palpation of the forearms with 3+ piting to the elbows.) and deformity (massive edema of left hand forearm, slightly less at right.) present.     Cervical back: Normal range of motion.     Right lower leg: No edema.     Left lower leg: No edema.     Comments: Cannot grip due to edema  Skin:    General: Skin is warm and dry.     Coloration: Skin is not jaundiced or pale.     Findings: Erythema and rash present. No lesion (no discreet nidus for infection).  Neurological:     Mental Status: Ronald [name] is alert and oriented to person, place, and time.         Assessment & Plan:   Diagnoses and all orders for this visit:  Lymphedema of both upper extremities  Cellulitis of upper extremity, unspecified laterality  Congestive heart failure, unspecified HF chronicity, unspecified heart failure type ([place])       I am having Ronald [name] maintain his onetouch ultrasoft, glucose blood, aspirin EC, albuterol, ipratropium-albuterol, amLODipine, metoprolol tartrate, furosemide, potassium chloride SA, escitalopram, nitroGLYCERIN, Accu-Chek Guide Me, pantoprazole, vitamin C, atorvastatin, losartan, nicotine, isosorbide mononitrate, cholecalciferol, Vitamin D3, B-complex with vitamin C, Vitamin D, mirtazapine, Breztri Aerosphere, ALPRAZolam, doxycycline, and predniSONE.  Allergies as of 09/25/2020      Reactions   Gabapentin Anxiety   Unknown reaction   Metformin And  Related Rash      Medication List       Accurate as of September 25, 2020 12:54 PM. If you have any questions, ask your nurse or doctor.        Accu-Chek Guide Me w/Device Kit Check BS daily Dx E11.9   albuterol 108 (90 Base) MCG/ACT inhaler Commonly known as: VENTOLIN HFA INHALE 2 PUFFS EVERY 6 HOURS AS NEEEDED What changed: See the new instructions.   ALPRAZolam 0.5 MG tablet Commonly known as: XANAX Take 1 tablet (0.5 mg total) by mouth 2 (two) times daily as needed. for anxiety   amLODipine 5 MG tablet Commonly known as: NORVASC Take 1 tablet (5 mg total) by mouth daily.   aspirin EC 81 MG tablet Take 1 tablet (81 mg total) by mouth daily.   atorvastatin 40 MG tablet Commonly known as: LIPITOR Take 1 tablet (40 mg total) by mouth every evening.  B-complex with vitamin C tablet Take 1 tablet by mouth daily.   Breztri Aerosphere 160-9-4.8 MCG/ACT Aero Generic drug: Budeson-Glycopyrrol-Formoterol Inhale 2 puffs into the lungs in the morning and at bedtime.   cholecalciferol 25 MCG (1000 UNIT) tablet Commonly known as: VITAMIN D3 Take 1,000 Units by mouth daily.   Vitamin D3 10 MCG (400 UNIT) Caps Take 400 Units by mouth daily.   Vitamin D 50 MCG (2000 UT) tablet Take 2,000 Units by mouth daily.   doxycycline 100 MG tablet Commonly known as: VIBRA-TABS Take 1 tablet (100 mg total) by mouth 2 (two) times daily.   escitalopram 20 MG tablet Commonly known as: Lexapro Take 1 tablet (20 mg total) by mouth daily.   furosemide 20 MG tablet Commonly known as: LASIX Take 1 tablet (20 mg total) by mouth 2 (two) times daily.   glucose blood test strip Commonly known as: ONE TOUCH ULTRA TEST USE TO CHECK GLUCOSE ONCE DAILY   ipratropium-albuterol 0.5-2.5 (3) MG/3ML Soln Commonly known as: DUONEB USE ONE VIAL BY NEBULIZER FOUR TIMES DAILY What changed:   how much to take  how to take this  when to take this  reasons to take this  additional  instructions   isosorbide mononitrate 30 MG 24 hr tablet Commonly known as: IMDUR Take 1 tablet (30 mg total) by mouth daily.   losartan 100 MG tablet Commonly known as: COZAAR Take 100 mg by mouth daily.   metoprolol tartrate 50 MG tablet Commonly known as: LOPRESSOR Take 1 tablet (50 mg total) by mouth 2 (two) times daily.   mirtazapine 15 MG tablet Commonly known as: REMERON TAKE 1 TABLET BY MOUTH AT BEDTIME   nicotine 14 mg/24hr patch Commonly known as: NICODERM CQ - dosed in mg/24 hours Place 1 patch (14 mg total) onto the skin daily.   nitroGLYCERIN 0.4 MG SL tablet Commonly known as: NITROSTAT DISSOLVE TAKE 1 TABLET UNDER THE TONGUE EVERY FIVE MINUTES AS NEEDED FOR CHEST PAIN What changed:   how much to take  how to take this  when to take this  reasons to take this  additional instructions   onetouch ultrasoft lancets Use as instructed   DX E11.9   pantoprazole 40 MG tablet Commonly known as: PROTONIX Take 1 tablet (40 mg total) by mouth daily.   potassium chloride SA 20 MEQ tablet Commonly known as: KLOR-CON Take 1 tablet (20 mEq total) by mouth 3 (three) times daily.   predniSONE 10 MG (21) Tbpk tablet Commonly known as: STERAPRED UNI-PAK 21 TAB Use as directed   vitamin C 1000 MG tablet Take 1,000 mg by mouth daily.      PT. With massive lymphedema, stasis cellulitis. Needs to R/O DVT, vascular eval and IV abx for cellulitis. Sent to [facility] for presumptive admission through E.D.  Follow-up: after hospital release  Claretta Fraise, M.D.

## 2020-09-25 NOTE — H&P (Signed)
History and Physical    [name] IRC:[id number] DOB: [birth year] DOA: 09/25/2020  PCP: [name], FNP   Patient coming from: Home  I have personally briefly reviewed patient's old medical records in [place]  Chief Complaint: Left hand swelling and redness  HPI: Ronald [name] is a 75 y.o. male with medical history significant for diabetes mellitus, COPD, CHF, ulcerative colitis, coronary artery disease. Patient was sent to the ED from primary care provider's office with reports of swelling to his bilateral upper extremities which was particularly worse in his left upper extremity. Swelling has been ongoing over the past 4 years, but over the past 2 weeks, he has noticed that he is left upper extremity swelling worsened with increasing redness.  He denies pain.  No fevers no chills.   He denies swelling involving his neck or head, no voice change, no difficulty breathing, he denies chest pain also.  ED Course: Temperature 98.2.  Heart rate 57-79.  Respiratory rate 17-22.  Blood pressure systolic 510C to 585I.  O2 sats greater than 91% on room air. WBC 10.6.  Unremarkable CMP.  BNP 237.  Unremarkable UA.  Bilateral venous Dopplers of all right extremities negative for DVT, recommendation to consider CT of the chest with contrast if persistent significant edema or evidence of SVC syndrome. Patient was started on IV clindamycin.  Hospitalist to admit.  Review of Systems: As per HPI all other systems reviewed and negative.  Past Medical History:  Diagnosis Date  . Anxiety   . Asthma   . Chronic lower back pain   . COPD (chronic obstructive pulmonary disease) ([place])   . Coronary artery disease    a. NSTEMI 05/2014 s/p DESx2 to LAD at [facility].  . Depression   . Educated about COVID-19 virus infection 03/06/2020  . GERD (gastroesophageal reflux disease)   . High cholesterol   . Hypertension   . NSTEMI (non-ST elevated myocardial infarction) ([place]) 05/2014   with stent  placement  . Sleep apnea   . Stroke ([place]) 2017   anyeusym   . TIA (transient ischemic attack)    "they say I've had some mini strokes; don't know when"; denies residual on 06/22/2014)  . Type II diabetes mellitus ([place])   . Ulcerative colitis [facility])     Past Surgical History:  Procedure Laterality Date  . APPENDECTOMY    . BIOPSY  07/20/2020   Procedure: BIOPSY;  Surgeon: Montez Morita, Quillian Quince, MD;  Location: [facility];  Service: Gastroenterology;;  . CARDIAC CATHETERIZATION  1990's X 3  . CHOLECYSTECTOMY    . COLONOSCOPY WITH PROPOFOL N/A 07/20/2020   Procedure: COLONOSCOPY WITH PROPOFOL;  Surgeon: Harvel Quale, MD;  Location: [facility];  Service: Gastroenterology;  Laterality: N/A;  1:15  . CORONARY ANGIOPLASTY WITH STENT PLACEMENT  05/2014   "2"  . ESOPHAGEAL DILATION N/A 07/20/2020   Procedure: ESOPHAGEAL DILATION;  Surgeon: Harvel Quale, MD;  [facility];  Service: Gastroenterology;  Laterality: N/A;  . ESOPHAGOGASTRODUODENOSCOPY (EGD) WITH PROPOFOL N/A 07/20/2020   Procedure: ESOPHAGOGASTRODUODENOSCOPY (EGD) WITH PROPOFOL;  Surgeon: Harvel Quale, MD;  [facility];  Service: Gastroenterology;  Laterality: N/A;  . LEFT HEART CATH AND CORONARY ANGIOGRAPHY N/A 05/25/2020   Procedure: LEFT HEART CATH AND CORONARY ANGIOGRAPHY;  Surgeon: Martinique, Peter M, MD;  Location: [place] CV LAB;  Service: Cardiovascular;  Laterality: N/A;  . POLYPECTOMY  07/20/2020   Procedure: POLYPECTOMY INTESTINAL;  Surgeon: Montez Morita,  Quillian Quince, MD;  Location[facility];  Service: Gastroenterology;;  . TUMOR EXCISION Right ~ 1999   "side of my upper head"     reports that he has been smoking cigarettes. He started smoking about 54 years ago. He has a 72.00 pack-year smoking history. He has never used smokeless tobacco. He reports previous alcohol use. He reports that he does not use drugs.  Allergies  Allergen Reactions  .  Gabapentin Anxiety    Unknown reaction  . Metformin And Related Rash    Family History  Problem Relation Age of Onset  . CAD Father   . Lung cancer Brother        smoked  . Cancer Brother        lung  . Leukemia Sister   . Dementia Sister   . Stroke Mother   . Emphysema Sister   . Cancer Brother        lung    Prior to Admission medications   Medication Sig Start Date End Date Taking? Authorizing Provider  albuterol (VENTOLIN HFA) 108 (90 Base) MCG/ACT inhaler INHALE 2 PUFFS EVERY 6 HOURS AS NEEEDED Patient taking differently: Inhale 2 puffs into the lungs every 6 (six) hours as needed for wheezing or shortness of breath. 11/02/19  Yes Hawks, Christy A, FNP  ALPRAZolam (XANAX) 0.5 MG tablet Take 1 tablet (0.5 mg total) by mouth 2 (two) times daily as needed. for anxiety 09/12/20  Yes Hawks, Christy A, FNP  amLODipine (NORVASC) 5 MG tablet Take 1 tablet (5 mg total) by mouth daily. 03/09/20  Yes Hawks, Christy A, FNP  Ascorbic Acid (VITAMIN C) 1000 MG tablet Take 1,000 mg by mouth daily.   Yes [provider]  aspirin EC 81 MG tablet Take 1 tablet (81 mg total) by mouth daily. 10/15/18  Yes Herminio Commons, MD  atorvastatin (LIPITOR) 40 MG tablet Take 1 tablet (40 mg total) by mouth every evening. 05/30/20  Yes Hawks, Christy A, FNP  Budeson-Glycopyrrol-Formoterol (BREZTRI AEROSPHERE) 160-9-4.8 MCG/ACT AERO Inhale 2 puffs into the lungs in the morning and at bedtime. 09/05/20  Yes Hawks, Christy A, FNP  cholecalciferol (VITAMIN D3) 25 MCG (1000 UNIT) tablet Take 1,000 Units by mouth daily.   Yes [provider]  escitalopram (LEXAPRO) 20 MG tablet Take 1 tablet (20 mg total) by mouth daily. 03/09/20  Yes Hawks, Christy A, FNP  Fluticasone-Umeclidin-Vilant (TRELEGY ELLIPTA IN) Inhale 1 puff into the lungs daily.   Yes [provider]  furosemide (LASIX) 20 MG tablet Take 1 tablet (20 mg total) by mouth 2 (two) times daily. 03/09/20  Yes Hawks, Christy A, FNP   guaiFENesin (REFENESEN 400 PO) Take 2 tablets by mouth daily as needed (mucas).   Yes [provider]  ipratropium-albuterol (DUONEB) 0.5-2.5 (3) MG/3ML SOLN USE ONE VIAL BY NEBULIZER FOUR TIMES DAILY Patient taking differently: Inhale 3 mLs into the lungs every 6 (six) hours as needed (asthma). 03/06/20  Yes Hawks, Christy A, FNP  isosorbide mononitrate (IMDUR) 30 MG 24 hr tablet Take 1 tablet (30 mg total) by mouth daily. 06/18/20  Yes Verta Ellen., NP  losartan (COZAAR) 100 MG tablet Take 100 mg by mouth daily.   Yes [provider]  metoprolol tartrate (LOPRESSOR) 50 MG tablet Take 1 tablet (50 mg total) by mouth 2 (two) times daily. 03/09/20  Yes Hawks, Alyse Low A, FNP  mirtazapine (REMERON) 15 MG tablet TAKE 1 TABLET BY MOUTH AT BEDTIME 07/11/20  Yes Hawks, [place]  A, FNP  nicotine (NICODERM CQ - DOSED IN MG/24 HOURS) 14 mg/24hr patch Place 1 patch (14 mg total) onto the skin daily. 06/14/20  Yes Hawks, Christy A, FNP  nitroGLYCERIN (NITROSTAT) 0.4 MG SL tablet DISSOLVE TAKE 1 TABLET UNDER THE TONGUE EVERY FIVE MINUTES AS NEEDED FOR CHEST PAIN Patient taking differently: Place 0.4 mg under the tongue every 5 (five) minutes as needed for chest pain. 05/16/20  Yes Hawks, Christy A, FNP  OXYGEN Inhale 2 L into the lungs daily. 24 hours a day   Yes [provider]  pantoprazole (PROTONIX) 40 MG tablet Take 1 tablet (40 mg total) by mouth daily. 05/22/20  Yes Hawks, Christy A, FNP  B Complex-C (B-COMPLEX WITH VITAMIN C) tablet Take 1 tablet by mouth daily. Patient not taking: Reported on 09/25/2020    [provider]  Blood Glucose Monitoring Suppl (ACCU-CHEK GUIDE ME) w/Device KIT Check BS daily Dx E11.9 05/16/20   [name] A, FNP  Cholecalciferol (VITAMIN D) 50 MCG (2000 UT) tablet Take 2,000 Units by mouth daily. Patient not taking: Reported on 09/25/2020    [provider]  Cholecalciferol (VITAMIN D3) 10 MCG (400 UNIT) CAPS Take 400 Units by  mouth daily. Patient not taking: Reported on 09/25/2020    [provider]  doxycycline (VIBRA-TABS) 100 MG tablet Take 1 tablet (100 mg total) by mouth 2 (two) times daily. Patient not taking: No sig reported 09/12/20   [name] A, FNP  Fluticasone-Umeclidin-Vilant (TRELEGY ELLIPTA) 100-62.5-25 MCG/INH AEPB Inhale into the lungs. Patient not taking: No sig reported    [provider]  glucose blood (ONE TOUCH ULTRA TEST) test strip USE TO CHECK GLUCOSE ONCE DAILY 02/11/18   Terald Sleeper, PA-C  Lancets [facility] ULTRASOFT) lancets Use as instructed   DX E11.9 05/05/17   [name] A, FNP  potassium chloride SA (KLOR-CON) 20 MEQ tablet Take 1 tablet (20 mEq total) by mouth 3 (three) times daily. Patient not taking: No sig reported 03/09/20   [name], FNP  predniSONE (STERAPRED UNI-PAK 21 TAB) 10 MG (21) TBPK tablet Use as directed Patient not taking: No sig reported 09/12/20   [name], FNP    Physical Exam: Vitals:   09/25/20 1947 09/25/20 2023 09/25/20 2048 09/25/20 2100  BP:  (!) 192/77 (!) 188/87   Pulse:  (!) 57 79   Resp:  17 18   Temp: 98.2 F (36.8 C) 98 F (36.7 C) 97.8 F (36.6 C)   TempSrc: Oral Oral Oral   SpO2:  91% 91%   Weight:    89.8 kg  Height:    5' 9"  (1.753 m)    Constitutional: NAD, calm, comfortable Vitals:   09/25/20 1947 09/25/20 2023 09/25/20 2048 09/25/20 2100  BP:  (!) 192/77 (!) 188/87   Pulse:  (!) 57 79   Resp:  17 18   Temp: 98.2 F (36.8 C) 98 F (36.7 C) 97.8 F (36.6 C)   TempSrc: Oral Oral Oral   SpO2:  91% 91%   Weight:    89.8 kg  Height:    5' 9"  (1.753 m)   Eyes: PERRL, lids and conjunctivae normal ENMT: Mucous membranes are moist.  Neck: normal, supple, no masses, no thyromegaly Respiratory: clear to auscultation bilaterally, no wheezing, no crackles. Normal respiratory effort. No accessory muscle use.  Cardiovascular: Regular rate and rhythm, no murmurs / rubs / gallops. No lower extremity  edema. 2+ pedal pulses.  Abdomen: no tenderness,  no masses palpated. No hepatosplenomegaly. Bowel sounds positive.  Musculoskeletal: no clubbing / cyanosis. No joint deformity upper and lower extremities. Good ROM, no contractures. Normal muscle tone.  Skin: Swelling to bilateral upper extremities significantly worse in the left upper extremity, with differential warmth, erythema.  No tenderness.   Neurologic: No apparent cranial abnormality, moving extremities spontaneously Psychiatric: Normal judgment and insight. Alert and oriented x 3. Normal mood.       Labs on Admission: I have personally reviewed following labs and imaging studies  CBC: Recent Labs  Lab 09/25/20 1655  WBC 10.6*  NEUTROABS 7.2  HGB 15.1  HCT 45.0  MCV 86.7  PLT 161   Basic Metabolic Panel: Recent Labs  Lab 09/25/20 1655  NA 139  K 3.8  CL 97*  CO2 35*  GLUCOSE 125*  BUN 15  CREATININE 0.92  CALCIUM 8.5*   Liver Function Tests: Recent Labs  Lab 09/25/20 1655  AST 14*  ALT 17  ALKPHOS 80  BILITOT 0.6  PROT 6.6  ALBUMIN 3.4*   Urine analysis:    Component Value Date/Time   COLORURINE YELLOW 09/25/2020 1700   APPEARANCEUR CLEAR 09/25/2020 1700   LABSPEC 1.014 09/25/2020 1700   PHURINE 5.0 09/25/2020 1700   GLUCOSEU NEGATIVE 09/25/2020 1700   HGBUR NEGATIVE 09/25/2020 1700   BILIRUBINUR NEGATIVE 09/25/2020 1700   KETONESUR NEGATIVE 09/25/2020 1700   PROTEINUR NEGATIVE 09/25/2020 1700   UROBILINOGEN 0.2 06/22/2014 1918   NITRITE NEGATIVE 09/25/2020 1700   LEUKOCYTESUR NEGATIVE 09/25/2020 1700    Radiological Exams on Admission: DG Chest 2 View  Result Date: 09/25/2020 CLINICAL DATA:  Bilateral arm swelling. EXAM: CHEST - 2 VIEW COMPARISON:  Chest x-ray dated September 12, 2020. FINDINGS: The heart size and mediastinal contours are within normal limits. The lungs remain hyperinflated with emphysematous changes in coarsened interstitial markings. No focal consolidation, pleural effusion,  or pneumothorax. No acute osseous abnormality. Old bilateral rib fractures again noted. IMPRESSION: 1. COPD. No acute cardiopulmonary disease. Electronically Signed   By: Titus Dubin M.D.   On: 09/25/2020 16:31   US Venous Img Upper Bilat  Result Date: 09/25/2020 CLINICAL DATA:  Bilateral upper extremity edema. EXAM: BILATERAL UPPER EXTREMITY VENOUS DOPPLER ULTRASOUND TECHNIQUE: Gray-scale sonography with graded compression, as well as color Doppler and duplex ultrasound were performed to evaluate the bilateral upper extremity deep venous systems from the level of the subclavian vein and including the jugular, axillary, basilic, radial, ulnar and upper cephalic vein. Spectral Doppler was utilized to evaluate flow at rest and with distal augmentation maneuvers. COMPARISON:  None. FINDINGS: RIGHT UPPER EXTREMITY Internal Jugular Vein: No evidence of thrombus. Normal compressibility, respiratory phasicity and response to augmentation. Subclavian Vein: No evidence of thrombus. Normal compressibility, respiratory phasicity and response to augmentation. Axillary Vein: No evidence of thrombus. Normal compressibility, respiratory phasicity and response to augmentation. Cephalic Vein: No evidence of thrombus. Normal compressibility, respiratory phasicity and response to augmentation. Basilic Vein: No evidence of thrombus. Normal compressibility, respiratory phasicity and response to augmentation. Brachial Veins: No evidence of thrombus. Normal compressibility, respiratory phasicity and response to augmentation. Radial Veins: No evidence of thrombus. Normal compressibility, respiratory phasicity and response to augmentation. Ulnar Veins: No evidence of thrombus. Normal compressibility, respiratory phasicity and response to augmentation. Venous Reflux:  None. Other Findings: No evidence of superficial thrombophlebitis or abnormal fluid collection. LEFT UPPER EXTREMITY Internal Jugular Vein: No evidence of thrombus.  Normal compressibility, respiratory phasicity and response to augmentation. Subclavian Vein: No evidence of thrombus. Normal  compressibility, respiratory phasicity and response to augmentation. Axillary Vein: No evidence of thrombus. Normal compressibility, respiratory phasicity and response to augmentation. Cephalic Vein: No evidence of thrombus. Normal compressibility, respiratory phasicity and response to augmentation. Basilic Vein: No evidence of thrombus. Normal compressibility, respiratory phasicity and response to augmentation. Brachial Veins: No evidence of thrombus. Normal compressibility, respiratory phasicity and response to augmentation. Radial Veins: No evidence of thrombus. Normal compressibility, respiratory phasicity and response to augmentation. Ulnar Veins: No evidence of thrombus. Normal compressibility, respiratory phasicity and response to augmentation. Venous Reflux:  None. Other Findings: No evidence of superficial thrombophlebitis or abnormal fluid collection. IMPRESSION: 1. No evidence of DVT within either upper extremity. 2. If there remains persistent significant edema of both upper extremities or clinical evidence of SVC syndrome, consider further evaluation with CT of the chest with contrast. Electronically Signed   By: Aletta Edouard M.D.   On: 09/25/2020 16:04    EKG: Independently reviewed.  Sinus rhythm rate 63, QTc 466.  No significant change from prior.  Assessment/Plan Principal Problem:   Cellulitis Active Problems:   CAD in native artery   Diabetes mellitus ([place])   COPD GOLD 3 / still smoking   Diabetic neuropathy ([facility])   CHF (congestive heart failure) ([facility])   Lymphedema   Left upper extremity cellulitis-with erythema and differential warmth, and significant swelling.  Rules out for sepsis.  History of lymphedema with swelling to both upper extremities of 4 years, right upper extremity swelling appears to be improving. -Bilateral upper extremity venous Dopplers  negative for DVT -Continue IV ceftriaxone 1 g daily -BMP, CBC in the morning  Coronary artery disease- stable. Denies chest pain.  EKG unremarkable. -Resume aspirin, statins.  Lymphedema-of 4 years.  RUE swelling improving, left upper extremity currently cellulitic.  Venous Dopplers -recommendation for CT if persistent significant edema or clinical evidence of SVC syndrome.  -Apart from upper extremity swelling, at this time he denies symptoms referable to SVC syndrome.  -Chest CT with contrast 08/2018 done for bilateral upper extremity swelling - showed widely patent SVC, without mediastinal supraclavicular or axillary mass.  Controlled diabetes mellitus-random glucose 125.Marland Kitchen  Diet controlled.  Hemoglobin A1c 6.8. - SSI- S.  Diastolic congestive heart failure-stable and compensated at this time. -Resume Lasix 20 mg twice daily -Resume losartan, metoprolol, Imdur  HTN-stable. -Resume Norvasc, Imdur, losartan, metoprolol  COPD-stable. -DuoNebs as needed   DVT prophylaxis: Lovenox Code Status: Full code Family Communication: None at bedside Disposition Plan: ~ 2 days Consults called: None Admission status: Inpt, tele I certify that at the point of admission it is my clinical judgment that the patient will require inpatient hospital care spanning beyond 2 midnights from the point of admission due to high intensity of service, high risk for further deterioration and high frequency of surveillance required.   Bethena Roys MD [facility]  09/25/2020, 9:41 PM

## 2020-09-25 NOTE — ED Triage Notes (Signed)
Bilateral swelling of both arms, states it has been swelling for 4 years but has never been this bad. Referred by PCP for treatment

## 2020-09-25 NOTE — ED Triage Notes (Signed)
Emergency Medicine Provider Triage Evaluation Note  Ronald [name] , a 75 y.o. male  was evaluated in triage.  Pt complains of worsening bilateral upper extremity swelling for the past 2 weeks.  He reports that they have been swollen for approximately 4 years however about 2 weeks ago the swelling became worse.  He also reports that his arms became red as well.  He states he went to see his PCP 2 weeks ago states he was placed on an antibiotic, does not remember the name.  He reports that the swelling in his right upper extremity seems to have decreased however continues to have worsening swelling in his left upper extremity.  He went and saw his PCP again today who advised to come to the ED for further evaluation.  Denies fevers or chills.  He reports that he has baseline shortness of breath to me.  It does appear he was complaining of chest pain with PCP however did not mention this to me.   Review of Systems  Positive: + BUE swelling and redness Negative: - fevers  Physical Exam  BP 136/72   Pulse 66   Temp 98.2 F (36.8 C) (Oral)   Resp (!) 22   SpO2 92%  Gen:   Awake, no distress   HEENT:  Atraumatic  Resp:  Normal effort  Cardiac:  Normal rate  Abd:   Nondistended, nontender  MSK:   Moves extremities without difficulty. 2+ pitting edema to LUE and 1+ pitting edema to RUE with erythema and increased warmth.  Neuro:  Speech clear   Medical Decision Making  Medically screening exam initiated at 2:21 PM.  Appropriate orders placed.  Ronald [name] was informed that the remainder of the evaluation will be completed by another provider, this initial triage assessment does not replace that evaluation, and the importance of remaining in the ED until their evaluation is complete.  Clinical Impression  75 year old male who presents to the ED today from PCPs office with bilateral upper extremity swelling and redness for the past 2 weeks.  History of lymphedema for the past 4 years however  swelling has gotten worse since then.  No fevers or chills.  There is concern for possible cellulitis as well as congestive heart failure exacerbation prompting PCP to advise patient to come to the ED today.  On arrival to the ED vitals are stable.  Patient is afebrile and nontachycardic.  He is mildly tachypneic with respirations at 22 and satting 92% on room air.  Patient reports history of COPD as well as past tracheostomy.  He reports he is chronically short of breath and does not feel worse than normal.  We will plan to work-up at this time with lab work including CBC, CMP, BNP, chest x-ray, EKG.  We will also plan for ultrasounds of the bilateral upper extremities to rule out DVT at this time given amount of swelling he is having.  She has been medically screened and appropriate to go back into the waiting room until a bed is open.    Ronald Maize, PA-C 09/25/20 1425

## 2020-09-25 NOTE — ED Provider Notes (Signed)
[place] Provider Note   CSN: [id number] Arrival date & time: 09/25/20  1336     History Chief Complaint  Patient presents with  . Arm Swelling    Ronald [name] is a 75 y.o. male with PMHx HTN, Type II Diabetes, COPD, CAD s/p NSTEMI, GERD, who presents to the ED complaining of worsening bilateral upper extremity swelling for the past 2 weeks.  He reports that they have been swollen for approximately 4 years however about 2 weeks ago the swelling became worse.  He also reports that his arms became red as well.  He states he went to see his PCP 2 weeks ago states he was placed on an antibiotic, does not remember the name.  He reports that the swelling in his right upper extremity seems to have decreased however continues to have worsening swelling in his left upper extremity.  He went and saw his PCP again today who advised to come to the ED for further evaluation.  Denies fevers or chills.  He reports that he has baseline shortness of breath to me.  It does appear he was complaining of chest pain with PCP however did not mention this to me.   The history is provided by the patient and medical records.       Past Medical History:  Diagnosis Date  . Anxiety   . Asthma   . Chronic lower back pain   . COPD (chronic obstructive pulmonary disease) ([place])   . Coronary artery disease    a. NSTEMI 05/2014 s/p DESx2 to LAD at [facility].  . Depression   . Educated about COVID-19 virus infection 03/06/2020  . GERD (gastroesophageal reflux disease)   . High cholesterol   . Hypertension   . NSTEMI (non-ST elevated myocardial infarction) ([place]) 05/2014   with stent placement  . Sleep apnea   . Stroke ([place]) 2017   anyeusym   . TIA (transient ischemic attack)    "they say I've had some mini strokes; don't know when"; denies residual on 06/22/2014)  . Type II diabetes mellitus ([place])   . Ulcerative colitis [facility])     Patient Active Problem List   Diagnosis Date Noted  .  Cellulitis 09/25/2020  . Chest pain 05/23/2020  . Controlled substance agreement signed 04/03/2020  . Benzodiazepine dependence ([place]) 04/03/2020  . Dysphagia 02/27/2020  . Diarrhea 02/27/2020  . Tracheal stenosis 12/09/2019  . Noncompliance 04/01/2019  . Lymphedema 04/01/2019  . CHF (congestive heart failure) ([place]) 08/27/2018  . Aortic atherosclerosis ([place]) 05/11/2017  . Depression 05/05/2017  . GERD (gastroesophageal reflux disease) 01/16/2017  . Diabetic neuropathy ([place]) 01/16/2017  . Anxiety 02/14/2016  . Chronic respiratory failure with hypoxia ([place]) 07/19/2014  . Hypertension associated with diabetes ([place]) 07/19/2014  . Cigarette smoker 07/19/2014  . Chest pain at rest 06/22/2014  . CAD in native artery 06/22/2014  . Diabetes mellitus ([place]) 06/22/2014  . COPD GOLD 3 / still smoking 06/22/2014  . Tobacco abuse 06/22/2014  . Acute encephalopathy 06/22/2014  . Leucocytosis 06/22/2014    Past Surgical History:  Procedure Laterality Date  . APPENDECTOMY    . BIOPSY  07/20/2020   Procedure: BIOPSY;  Surgeon: Montez Morita, Quillian Quince, MD;  [facility];  Service: Gastroenterology;;  . CARDIAC CATHETERIZATION  1990's X 3  . CHOLECYSTECTOMY    . COLONOSCOPY WITH PROPOFOL N/A 07/20/2020   Procedure: COLONOSCOPY WITH PROPOFOL;  Surgeon: Harvel Quale, MD;  Location: [facility];  Service: Gastroenterology;  Laterality: N/A;  1:15  . CORONARY ANGIOPLASTY WITH STENT PLACEMENT  05/2014   "2"  . ESOPHAGEAL DILATION N/A 07/20/2020   Procedure: ESOPHAGEAL DILATION;  Surgeon: Harvel Quale, MD;  Location[facility];  Service: Gastroenterology;  Laterality: N/A;  . ESOPHAGOGASTRODUODENOSCOPY (EGD) WITH PROPOFOL N/A 07/20/2020   Procedure: ESOPHAGOGASTRODUODENOSCOPY (EGD) WITH PROPOFOL;  Surgeon: Harvel Quale, MD;  [facility];  Service: Gastroenterology;  Laterality: N/A;  . LEFT HEART CATH AND CORONARY ANGIOGRAPHY N/A  05/25/2020   Procedure: LEFT HEART CATH AND CORONARY ANGIOGRAPHY;  Surgeon: Martinique, Peter M, MD;  Location: [facility];  Service: Cardiovascular;  Laterality: N/A;  . POLYPECTOMY  07/20/2020   Procedure: POLYPECTOMY INTESTINAL;  Surgeon: Harvel Quale, MD;  Location[facility];  Service: Gastroenterology;;  . TUMOR EXCISION Right ~ 1999   "side of my upper head"       Family History  Problem Relation Age of Onset  . CAD Father   . Lung cancer Brother        smoked  . Cancer Brother        lung  . Leukemia Sister   . Dementia Sister   . Stroke Mother   . Emphysema Sister   . Cancer Brother        lung    Social History   Tobacco Use  . Smoking status: Current Every Day Smoker    Packs/day: 1.50    Years: 48.00    Pack years: 72.00    Types: Cigarettes    Start date: 02/12/1966  . Smokeless tobacco: Never Used  . Tobacco comment: smokes 5-6 cigarettes per day 06/07/2020  Vaping Use  . Vaping Use: Never used  Substance Use Topics  . Alcohol use: Not Currently    Alcohol/week: 0.0 standard drinks  . Drug use: No    Home Medications Prior to Admission medications   Medication Sig Start Date End Date Taking? Authorizing Provider  albuterol (VENTOLIN HFA) 108 (90 Base) MCG/ACT inhaler INHALE 2 PUFFS EVERY 6 HOURS AS NEEEDED Patient taking differently: Inhale 2 puffs into the lungs every 6 (six) hours as needed for wheezing or shortness of breath. 11/02/19   [name], FNP  ALPRAZolam Duanne Moron) 0.5 MG tablet Take 1 tablet (0.5 mg total) by mouth 2 (two) times daily as needed. for anxiety 09/12/20   [name] A, FNP  amLODipine (NORVASC) 5 MG tablet Take 1 tablet (5 mg total) by mouth daily. 03/09/20   [name] A, FNP  Ascorbic Acid (VITAMIN C) 1000 MG tablet Take 1,000 mg by mouth daily.    [provider]  aspirin EC 81 MG tablet Take 1 tablet (81 mg total) by mouth daily. 10/15/18   Herminio Commons, MD  atorvastatin (LIPITOR)  40 MG tablet Take 1 tablet (40 mg total) by mouth every evening. 05/30/20   [name] A, FNP  B Complex-C (B-COMPLEX WITH VITAMIN C) tablet Take 1 tablet by mouth daily.    [provider]  Blood Glucose Monitoring Suppl (ACCU-CHEK GUIDE ME) w/Device KIT Check BS daily Dx E11.9 05/16/20   [name] A, FNP  Budeson-Glycopyrrol-Formoterol (BREZTRI AEROSPHERE) 160-9-4.8 MCG/ACT AERO Inhale 2 puffs into the lungs in the morning and at bedtime. 09/05/20   [name] A, FNP  Cholecalciferol (VITAMIN D) 50 MCG (2000 UT) tablet Take 2,000 Units by mouth daily.    [provider]  Cholecalciferol (VITAMIN D3) 10 MCG (400 UNIT) CAPS Take  400 Units by mouth daily.    [provider]  cholecalciferol (VITAMIN D3) 25 MCG (1000 UNIT) tablet Take 1,000 Units by mouth daily.    [provider]  doxycycline (VIBRA-TABS) 100 MG tablet Take 1 tablet (100 mg total) by mouth 2 (two) times daily. Patient not taking: No sig reported 09/12/20   [name] A, FNP  escitalopram (LEXAPRO) 20 MG tablet Take 1 tablet (20 mg total) by mouth daily. 03/09/20   [name], FNP  furosemide (LASIX) 20 MG tablet Take 1 tablet (20 mg total) by mouth 2 (two) times daily. 03/09/20   [name] A, FNP  glucose blood (ONE TOUCH ULTRA TEST) test strip USE TO CHECK GLUCOSE ONCE DAILY 02/11/18   Terald Sleeper, PA-C  ipratropium-albuterol (DUONEB) 0.5-2.5 (3) MG/3ML SOLN USE ONE VIAL BY NEBULIZER FOUR TIMES DAILY Patient taking differently: Inhale 3 mLs into the lungs every 6 (six) hours as needed (asthma). 03/06/20   [name] A, FNP  isosorbide mononitrate (IMDUR) 30 MG 24 hr tablet Take 1 tablet (30 mg total) by mouth daily. 06/18/20   Verta Ellen., NP  Lancets [facility] ULTRASOFT) lancets Use as instructed   DX E11.9 05/05/17   [name], FNP  losartan (COZAAR) 100 MG tablet Take 100 mg by mouth daily.    [provider]  metoprolol tartrate (LOPRESSOR) 50  MG tablet Take 1 tablet (50 mg total) by mouth 2 (two) times daily. 03/09/20   [name], FNP  mirtazapine (REMERON) 15 MG tablet TAKE 1 TABLET BY MOUTH AT BEDTIME 07/11/20   Hawks, [place] A, FNP  nicotine (NICODERM CQ - DOSED IN MG/24 HOURS) 14 mg/24hr patch Place 1 patch (14 mg total) onto the skin daily. 06/14/20   [name] A, FNP  nitroGLYCERIN (NITROSTAT) 0.4 MG SL tablet DISSOLVE TAKE 1 TABLET UNDER THE TONGUE EVERY FIVE MINUTES AS NEEDED FOR CHEST PAIN Patient taking differently: Place 0.4 mg under the tongue every 5 (five) minutes as needed for chest pain. 05/16/20   [name] A, FNP  pantoprazole (PROTONIX) 40 MG tablet Take 1 tablet (40 mg total) by mouth daily. 05/22/20   [name] A, FNP  potassium chloride SA (KLOR-CON) 20 MEQ tablet Take 1 tablet (20 mEq total) by mouth 3 (three) times daily. 03/09/20   [name], FNP  predniSONE (STERAPRED UNI-PAK 21 TAB) 10 MG (21) TBPK tablet Use as directed Patient not taking: No sig reported 09/12/20   [name] A, FNP    Allergies    Gabapentin and Metformin and related  Review of Systems   Review of Systems  Constitutional: Negative for chills and fever.  Respiratory: Positive for shortness of breath.   Musculoskeletal: Positive for arthralgias and joint swelling.  Skin: Positive for color change.  All other systems reviewed and are negative.   Physical Exam Updated Vital Signs BP 136/72   Pulse 66   Temp 98.2 F (36.8 C) (Oral)   Resp (!) 22   SpO2 92%   Physical Exam Vitals and nursing note reviewed.  Constitutional:      Appearance: He is obese. He is not ill-appearing.  HENT:     Head: Normocephalic and atraumatic.  Eyes:     Conjunctiva/sclera: Conjunctivae normal.  Cardiovascular:     Rate and Rhythm: Normal rate and regular rhythm.     Pulses: Normal pulses.  Pulmonary:     Effort: Pulmonary effort is normal.     Breath  sounds: Normal breath sounds. No wheezing, rhonchi or rales.   Abdominal:     Palpations: Abdomen is soft.     Tenderness: There is no abdominal tenderness. There is no guarding or rebound.  Musculoskeletal:     Cervical back: Neck supple.     Comments: BUEs edema; 2+ pitting edema on left and 1+ pitting edema on right with erythema and increased warmth to the touch. ROM limited s/2 pain. 2+ radial pulses bilaterally.   Skin:    General: Skin is warm and dry.  Neurological:     Mental Status: He is alert.         ED Results / Procedures / Treatments   Labs (all labs ordered are listed, but only abnormal results are displayed) Labs Reviewed  CBC WITH DIFFERENTIAL/PLATELET - Abnormal; Notable for the following components:      Result Value   WBC 10.6 (*)    Monocytes Absolute 1.1 (*)    Abs Immature Granulocytes 0.09 (*)    All other components within normal limits  COMPREHENSIVE METABOLIC PANEL - Abnormal; Notable for the following components:   Chloride 97 (*)    CO2 35 (*)    Glucose, Bld 125 (*)    Calcium 8.5 (*)    Albumin 3.4 (*)    AST 14 (*)    All other components within normal limits  BRAIN NATRIURETIC PEPTIDE - Abnormal; Notable for the following components:   B Natriuretic Peptide 237.0 (*)    All other components within normal limits  SARS CORONAVIRUS 2 (TAT 6-24 HRS)  URINALYSIS, ROUTINE W REFLEX MICROSCOPIC    EKG None  Radiology DG Chest 2 View  Result Date: 09/25/2020 CLINICAL DATA:  Bilateral arm swelling. EXAM: CHEST - 2 VIEW COMPARISON:  Chest x-ray dated September 12, 2020. FINDINGS: The heart size and mediastinal contours are within normal limits. The lungs remain hyperinflated with emphysematous changes in coarsened interstitial markings. No focal consolidation, pleural effusion, or pneumothorax. No acute osseous abnormality. Old bilateral rib fractures again noted. IMPRESSION: 1. COPD. No acute cardiopulmonary disease. Electronically Signed   By: Titus Dubin M.D.   On: 09/25/2020 16:31   US Venous Img  Upper Bilat  Result Date: 09/25/2020 CLINICAL DATA:  Bilateral upper extremity edema. EXAM: BILATERAL UPPER EXTREMITY VENOUS DOPPLER ULTRASOUND TECHNIQUE: Gray-scale sonography with graded compression, as well as color Doppler and duplex ultrasound were performed to evaluate the bilateral upper extremity deep venous systems from the level of the subclavian vein and including the jugular, axillary, basilic, radial, ulnar and upper cephalic vein. Spectral Doppler was utilized to evaluate flow at rest and with distal augmentation maneuvers. COMPARISON:  None. FINDINGS: RIGHT UPPER EXTREMITY Internal Jugular Vein: No evidence of thrombus. Normal compressibility, respiratory phasicity and response to augmentation. Subclavian Vein: No evidence of thrombus. Normal compressibility, respiratory phasicity and response to augmentation. Axillary Vein: No evidence of thrombus. Normal compressibility, respiratory phasicity and response to augmentation. Cephalic Vein: No evidence of thrombus. Normal compressibility, respiratory phasicity and response to augmentation. Basilic Vein: No evidence of thrombus. Normal compressibility, respiratory phasicity and response to augmentation. Brachial Veins: No evidence of thrombus. Normal compressibility, respiratory phasicity and response to augmentation. Radial Veins: No evidence of thrombus. Normal compressibility, respiratory phasicity and response to augmentation. Ulnar Veins: No evidence of thrombus. Normal compressibility, respiratory phasicity and response to augmentation. Venous Reflux:  None. Other Findings: No evidence of superficial thrombophlebitis or abnormal fluid collection. LEFT UPPER EXTREMITY Internal Jugular Vein: No evidence of thrombus. Normal  compressibility, respiratory phasicity and response to augmentation. Subclavian Vein: No evidence of thrombus. Normal compressibility, respiratory phasicity and response to augmentation. Axillary Vein: No evidence of thrombus.  Normal compressibility, respiratory phasicity and response to augmentation. Cephalic Vein: No evidence of thrombus. Normal compressibility, respiratory phasicity and response to augmentation. Basilic Vein: No evidence of thrombus. Normal compressibility, respiratory phasicity and response to augmentation. Brachial Veins: No evidence of thrombus. Normal compressibility, respiratory phasicity and response to augmentation. Radial Veins: No evidence of thrombus. Normal compressibility, respiratory phasicity and response to augmentation. Ulnar Veins: No evidence of thrombus. Normal compressibility, respiratory phasicity and response to augmentation. Venous Reflux:  None. Other Findings: No evidence of superficial thrombophlebitis or abnormal fluid collection. IMPRESSION: 1. No evidence of DVT within either upper extremity. 2. If there remains persistent significant edema of both upper extremities or clinical evidence of SVC syndrome, consider further evaluation with CT of the chest with contrast. Electronically Signed   By: Aletta Edouard M.D.   On: 09/25/2020 16:04    Procedures Procedures   Medications Ordered in ED Medications  clindamycin (CLEOCIN) IVPB 600 mg (600 mg Intravenous New Bag/Given 09/25/20 1854)    ED Course  I have reviewed the triage vital signs and the nursing notes.  Pertinent labs & imaging results that were available during my care of the patient were reviewed by me and considered in my medical decision making (see chart for details).    MDM Rules/Calculators/A&P                          75 year old male who who was initially medically screened by myself prior today who presents to the ED today complaining of bilateral upper extremity swelling for the past 2 weeks, history of same for 4 years however worse recently with associated erythema.  By PCP with concern for possible cellulitis versus CHF exacerbation.  She denies any history of worsening shortness of breath.  DVT study  while in the waiting room without any evidence of clot.  Chest x-ray clear. Labs pending at this time.   CBC with a mild leukocytosis of 10,600.  No left shift. CMP with a glucose was 125.  Bicarb 35.  Chloride 97.  Remainder of CMP without any electrolyte abnormalities. BNP of 237.  U/A negative for infection.  Nursing staff patient's significant other has been calling him to the ER requesting information.  I did speak with her at length.  She was under the impression that patient needed to go to [facility] for further evaluation.  She reports that she was told by Dr. Quinn Axe patient's PCP that he needed to see a vascular provider there.  I did a thorough come through of Dr. Artemio Aly note which did not have any mention of this.  I also spoke with the on-call provider at [facility] family medicine who evaluated Dr. Artemio Aly note as well.  Unfortunately I was unable to speak with Dr. Quinn Axe himself however on-call provider does report that it seems that Dr. Quinn Axe wanted him admitted for IV antibiotics and further work-up of his his swelling.  There is no mention of transfer to [facility] for any reason.  There is no mention of speaking with a vascular surgeon for any reason.  Will call for admission at this time.  IV antibiotic started.  Gust case with attending physician Dr. Sabra Heck who agrees with plan.  Discussed case with Dr. Denton Brick who agrees to evaluate patient for admission.  Appreciate  her involvement.  This note was prepared using Dragon voice recognition software and may include unintentional dictation errors due to the inherent limitations of voice recognition software.   Final Clinical Impression(s) / ED Diagnoses Final diagnoses:  Cellulitis of upper extremity, unspecified laterality    Rx / DC Orders ED Discharge Orders    None       Eustaquio Maize, PA-C 09/25/20 1910    Noemi Chapel, MD 09/25/20 2036

## 2020-09-26 DIAGNOSIS — I890 Lymphedema, not elsewhere classified: Secondary | ICD-10-CM

## 2020-09-26 DIAGNOSIS — J449 Chronic obstructive pulmonary disease, unspecified: Secondary | ICD-10-CM

## 2020-09-26 DIAGNOSIS — I2510 Atherosclerotic heart disease of native coronary artery without angina pectoris: Secondary | ICD-10-CM

## 2020-09-26 DIAGNOSIS — L03119 Cellulitis of unspecified part of limb: Secondary | ICD-10-CM

## 2020-09-26 LAB — BASIC METABOLIC PANEL
Anion gap: 8 (ref 5–15)
BUN: 16 mg/dL (ref 8–23)
CO2: 32 mmol/L (ref 22–32)
Calcium: 8.4 mg/dL — ABNORMAL LOW (ref 8.9–10.3)
Chloride: 101 mmol/L (ref 98–111)
Creatinine, Ser: 0.82 mg/dL (ref 0.61–1.24)
GFR, Estimated: >60 mL/min (ref >60)
Glucose, Bld: 130 mg/dL — ABNORMAL HIGH (ref 70–99)
Potassium: 3.2 mmol/L — ABNORMAL LOW (ref 3.5–5.1)
Sodium: 141 mmol/L (ref 135–145)

## 2020-09-26 LAB — GLUCOSE, CAPILLARY
Glucose-Capillary: 127 mg/dL — ABNORMAL HIGH (ref 70–99)
Glucose-Capillary: 131 mg/dL — ABNORMAL HIGH (ref 70–99)
Glucose-Capillary: 172 mg/dL — ABNORMAL HIGH (ref 70–99)
Glucose-Capillary: 104 mg/dL — ABNORMAL HIGH (ref 70–99)

## 2020-09-26 LAB — CBC
HCT: 40.5 % (ref 39.0–52.0)
Hemoglobin: 13.4 g/dL (ref 13.0–17.0)
MCH: 28.8 pg (ref 26.0–34.0)
MCHC: 33.1 g/dL (ref 30.0–36.0)
MCV: 86.9 fL (ref 80.0–100.0)
Platelets: 312 10*3/uL (ref 150–400)
RBC: 4.66 MIL/uL (ref 4.22–5.81)
RDW: 13.2 % (ref 11.5–15.5)
WBC: 9.1 10*3/uL (ref 4.0–10.5)
nRBC: 0 % (ref 0.0–0.2)

## 2020-09-26 LAB — SARS CORONAVIRUS 2 (TAT 6-24 HRS): SARS Coronavirus 2: NEGATIVE

## 2020-09-26 MED ORDER — IPRATROPIUM-ALBUTEROL 0.5-2.5 (3) MG/3ML IN SOLN
3.0000 mL | Freq: Three times a day (TID) | RESPIRATORY_TRACT | Status: DC
  Administered 2020-09-26 – 2020-09-28 (×6): 3 mL via RESPIRATORY_TRACT
  Filled 2020-09-26 (×7): qty 3

## 2020-09-26 MED ORDER — CEFAZOLIN SODIUM-DEXTROSE 2-4 GM/100ML-% IV SOLN
2.0000 g | Freq: Three times a day (TID) | INTRAVENOUS | Status: DC
  Administered 2020-09-26 – 2020-09-28 (×7): 2 g via INTRAVENOUS
  Filled 2020-09-26 (×7): qty 100

## 2020-09-26 MED ORDER — IPRATROPIUM-ALBUTEROL 0.5-2.5 (3) MG/3ML IN SOLN: 3.0000 mL | Freq: Three times a day (TID) | RESPIRATORY_TRACT | Status: DC

## 2020-09-26 MED ORDER — POTASSIUM CHLORIDE CRYS ER 20 MEQ PO TBCR
40.0000 meq | EXTENDED_RELEASE_TABLET | Freq: Once | ORAL | Status: AC
  Administered 2020-09-26: 40 meq via ORAL
  Filled 2020-09-26: qty 2
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
## 2020-09-27 ENCOUNTER — Encounter: Payer: Self-pay | Admitting: Family

## 2020-09-27 DIAGNOSIS — R2233 Localized swelling, mass and lump, upper limb, bilateral: Secondary | ICD-10-CM

## 2020-09-27 LAB — BASIC METABOLIC PANEL
Anion gap: 8 (ref 5–15)
BUN: 15 mg/dL (ref 8–23)
CO2: 32 mmol/L (ref 22–32)
Calcium: 8.3 mg/dL — ABNORMAL LOW (ref 8.9–10.3)
Chloride: 100 mmol/L (ref 98–111)
Creatinine, Ser: 0.86 mg/dL (ref 0.61–1.24)
GFR, Estimated: >60 mL/min (ref >60)
Glucose, Bld: 144 mg/dL — ABNORMAL HIGH (ref 70–99)
Potassium: 3.4 mmol/L — ABNORMAL LOW (ref 3.5–5.1)
Sodium: 140 mmol/L (ref 135–145)

## 2020-09-27 LAB — CBC
HCT: 43.5 % (ref 39.0–52.0)
Hemoglobin: 14 g/dL (ref 13.0–17.0)
MCH: 28.5 pg (ref 26.0–34.0)
MCHC: 32.2 g/dL (ref 30.0–36.0)
MCV: 88.4 fL (ref 80.0–100.0)
Platelets: 293 10*3/uL (ref 150–400)
RBC: 4.92 MIL/uL (ref 4.22–5.81)
RDW: 13.2 % (ref 11.5–15.5)
WBC: 9.2 10*3/uL (ref 4.0–10.5)
nRBC: 0 % (ref 0.0–0.2)

## 2020-09-27 LAB — SEDIMENTATION RATE: Sed Rate: 7 mm/hr (ref 0–16)

## 2020-09-27 LAB — GLUCOSE, CAPILLARY
Glucose-Capillary: 176 mg/dL — ABNORMAL HIGH (ref 70–99)
Glucose-Capillary: 147 mg/dL — ABNORMAL HIGH (ref 70–99)
Glucose-Capillary: 165 mg/dL — ABNORMAL HIGH (ref 70–99)
Glucose-Capillary: 184 mg/dL — ABNORMAL HIGH (ref 70–99)

## 2020-09-27 LAB — MAGNESIUM: Magnesium: 1.7 mg/dL (ref 1.7–2.4)

## 2020-09-27 LAB — C-REACTIVE PROTEIN: CRP: 0.9 mg/dL (ref <1.0)

## 2020-09-27 MED ORDER — MAGNESIUM SULFATE 2 GM/50ML IV SOLN
2.0000 g | Freq: Once | INTRAVENOUS | Status: AC
  Administered 2020-09-27: 2 g via INTRAVENOUS
  Filled 2020-09-27: qty 50

## 2020-09-27 MED ORDER — POTASSIUM CHLORIDE CRYS ER 20 MEQ PO TBCR
40.0000 meq | EXTENDED_RELEASE_TABLET | Freq: Once | ORAL | Status: AC
  Administered 2020-09-27: 40 meq via ORAL
  Filled 2020-09-27: qty 2
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
## 2020-09-28 LAB — MAGNESIUM: Magnesium: 2 mg/dL (ref 1.7–2.4)

## 2020-09-28 LAB — BASIC METABOLIC PANEL
Anion gap: 9 (ref 5–15)
BUN: 17 mg/dL (ref 8–23)
CO2: 31 mmol/L (ref 22–32)
Calcium: 8.1 mg/dL — ABNORMAL LOW (ref 8.9–10.3)
Chloride: 100 mmol/L (ref 98–111)
Creatinine, Ser: 0.88 mg/dL (ref 0.61–1.24)
GFR, Estimated: >60 mL/min (ref >60)
Glucose, Bld: 139 mg/dL — ABNORMAL HIGH (ref 70–99)
Potassium: 3.6 mmol/L (ref 3.5–5.1)
Sodium: 140 mmol/L (ref 135–145)

## 2020-09-28 LAB — GLUCOSE, CAPILLARY: Glucose-Capillary: 161 mg/dL — ABNORMAL HIGH (ref 70–99)

## 2020-09-28 MED ORDER — CEPHALEXIN 500 MG PO CAPS: 500.0000 mg | ORAL_CAPSULE | Freq: Four times a day (QID) | ORAL | Status: DC

## 2020-09-28 MED ORDER — CEPHALEXIN 500 MG PO CAPS: 500.0000 mg | ORAL_CAPSULE | Freq: Four times a day (QID) | ORAL | 0 refills | Status: DC

## 2020-09-28 NOTE — Care Plan (Signed)
Pt given AVS at this time, pt denies questions, IV removed. Medicaiton of Keflex educated to patient at this time and home medication regimen restart.  Pt off unit via wheelchair in stable condition.

## 2020-09-28 NOTE — TOC Transition Note (Signed)
Transition of Care [facility]) - CM/SW Discharge Note   Patient Details  Name: Ronald [name] MRN: [id number] Date of Birth: Sep 25, 1945  [facility] CM/SW Contact:  Salome Arnt, LCSW Phone Number: 09/28/2020, 10:12 AM   Clinical Narrative:   Pt d/c today. MD ordered outpatient rehab for lymphedema. Pt aware and on AVS.     Final next level of care: Home/Self Care Barriers to Discharge: Barriers Resolved   Patient Goals and CMS Choice Patient states their goals for this hospitalization and ongoing recovery are:: return home   Choice offered to / list presented to : Patient  Discharge Placement                       Discharge Plan and Services In-house Referral: Clinical Social Work              DME Arranged: N/A DME Agency: NA                  Social Determinants of Health ([place]) Interventions     Readmission Risk Interventions Readmission Risk Prevention Plan 09/28/2020  Transportation Screening Complete  HRI or [place] Complete  Social Work Consult for [place] Planning/Counseling Complete  Palliative Care Screening Not Applicable  Medication Review [employer]) Complete  Some recent data might be hidden
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
## 2020-09-28 NOTE — TOC Initial Note (Signed)
Transition of Care [facility]) - Initial/Assessment Note    Patient Details  Name: Ronald [name] MRN: [id number] Date of Birth: 02-24-1946  Transition of Care [facility]) CM/SW Contact:    Salome Arnt, [place] Phone Number: 09/28/2020, 8:45 AM  Clinical Narrative:  Pt admitted due to cellulitis. Assessment completed due to high risk readmission score. Pt reports he lives with his ex-wife who is his best support. He states he is independent with ADLs. He plans to return home when medically stable. TOC received consults for medication assistance and home health/DME. Pt has [employer] and indicates he can afford his medications. He does not feel he needs home health at this time and has all needed DME at home. TOC will continue to follow.                  Expected Discharge Plan: Home/Self Care Barriers to Discharge: Continued Medical Work up   Patient Goals and CMS Choice Patient states their goals for this hospitalization and ongoing recovery are:: return home   Choice offered to / list presented to : Patient  Expected Discharge Plan and Services Expected Discharge Plan: Home/Self Care In-house Referral: Clinical Social Work     Living arrangements for the past 2 months: Single Family Home                 DME Arranged: N/A DME Agency: NA                  Prior Living Arrangements/Services Living arrangements for the past 2 months: Single Family Home Lives with:: Other (Comment) Patient language and need for interpreter reviewed:: Yes Do you feel safe going back to the place where you live?: Yes      Need for Family Participation in Patient Care: No (Comment)   Current home services: DME (Home O2, cane, walker, shower chair, BSC) Criminal Activity/Legal Involvement Pertinent to Current Situation/Hospitalization: No - Comment as needed  Activities of Daily Living Home Assistive Devices/Equipment: Eyeglasses ADL Screening (condition at time of admission) Patient's  cognitive ability adequate to safely complete daily activities?: Yes Is the patient deaf or have difficulty hearing?: No Does the patient have difficulty seeing, even when wearing glasses/contacts?: No Does the patient have difficulty concentrating, remembering, or making decisions?: No Patient able to express need for assistance with ADLs?: Yes Does the patient have difficulty dressing or bathing?: No Independently performs ADLs?: Yes (appropriate for developmental age) Does the patient have difficulty walking or climbing stairs?: No Weakness of Legs: None Weakness of Arms/Hands: Both  Permission Sought/Granted                  Emotional Assessment   Attitude/Demeanor/Rapport: Engaged Affect (typically observed): Accepting Orientation: : Oriented to Self,Oriented to Place,Oriented to  Time,Oriented to Situation Alcohol / Substance Use: Not Applicable Psych Involvement: No (comment)  Admission diagnosis:  Cellulitis [L03.90] Cellulitis of upper extremity, unspecified laterality [I62.703] Patient Active Problem List   Diagnosis Date Noted  . Cellulitis 09/25/2020  . Chest pain 05/23/2020  . Controlled substance agreement signed 04/03/2020  . Benzodiazepine dependence ([name]) 04/03/2020  . Dysphagia 02/27/2020  . Diarrhea 02/27/2020  . Tracheal stenosis 12/09/2019  . Noncompliance 04/01/2019  . Lymphedema 04/01/2019  . CHF (congestive heart failure) ([place]) 08/27/2018  . Aortic atherosclerosis ([place]) 05/11/2017  . Depression 05/05/2017  . GERD (gastroesophageal reflux disease) 01/16/2017  . Diabetic neuropathy ([place]) 01/16/2017  . Anxiety 02/14/2016  . Chronic respiratory failure with hypoxia ([place]) 07/19/2014  .  Hypertension associated with diabetes ([place]) 07/19/2014  . Cigarette smoker 07/19/2014  . Chest pain at rest 06/22/2014  . CAD in native artery 06/22/2014  . Diabetes mellitus ([place]) 06/22/2014  . COPD GOLD 3 / still smoking 06/22/2014  . Tobacco abuse 06/22/2014   . Acute encephalopathy 06/22/2014  . Leucocytosis 06/22/2014   PCP:  [name], FNP Pharmacy:   [facility] [address], [place] [address] Phone: [phone] Fax: [phone]     Social Determinants of Health (SDOH) Interventions    Readmission Risk Interventions Readmission Risk Prevention Plan 09/28/2020  Transportation Screening Complete  HRI or Home Care Consult Complete  Social Work Consult for [place] Planning/Counseling Complete  Palliative Care Screening Not Applicable  Medication Review Press photographer) Complete  Some recent data might be hidden

## 2020-10-01 ENCOUNTER — Other Ambulatory Visit: Payer: Self-pay | Admitting: Family

## 2020-10-01 ENCOUNTER — Telehealth: Payer: Self-pay

## 2020-10-01 DIAGNOSIS — F1320 Sedative, hypnotic or anxiolytic dependence, uncomplicated: Secondary | ICD-10-CM

## 2020-10-01 DIAGNOSIS — F419 Anxiety disorder, unspecified: Secondary | ICD-10-CM

## 2020-10-01 NOTE — Telephone Encounter (Signed)
Transition Care Management Follow-up Telephone Call  Date of discharge and from where: 4/22 from [facility]  How have you been since you were released from the hospital? Stable - he is leaking constantly - skin is weeping and he constantly has to urinate  Any questions or concerns? Yes he is going to have to go to [place] for PT 3x per week starting 10/10/20 - Has a lot of trouble getting around and would prefer Home Health if at all possible. Can we send referral to [facility]?  Items Reviewed:  Did the pt receive and understand the discharge instructions provided? Yes   Medications obtained and verified? Yes   Other? No   Any new allergies since your discharge? No   Dietary orders reviewed? Yes  Do you have support at home? Yes   Home Care and Equipment/Supplies: Were home health services ordered? no If so, what is the name of the agency? N/a prefers [facility]  Has the agency set up a time to come to the patient's home? not applicable Were any new equipment or medical supplies ordered?  No   Functional Questionnaire: (I = Independent and D = Dependent) ADLs: I, but needs lots of assistance  Bathing/Dressing- I  Meal Prep- D  Eating- I  Maintaining continence- I, but usually goes on himself as he is too weak and out of breath to keep going to the bathroom  Transferring/Ambulation- I - with much difficulty, can walk very short distance with walker on his own  Managing Meds- I - but [name] ensures he takes them  Follow up appointments reviewed:   PCP Hospital f/u appt confirmed? Yes  Scheduled to see [name] on 10/12/20 @ 11:10.  [facility] f/u appt confirmed? Yes  Scheduled to see [facility] on end of May (not at home to give exact date and time right now)  Are transportation arrangements needed? No   If their condition worsens, is the pt aware to call PCP or go to the Emergency Dept.? Yes  Was the patient provided with contact  information for the PCP's office or ED? Yes  Was to pt encouraged to call back with questions or concerns? Yes

## 2020-10-04 DIAGNOSIS — J439 Emphysema, unspecified: Secondary | ICD-10-CM | POA: Diagnosis not present

## 2020-10-09 ENCOUNTER — Telehealth: Payer: Self-pay

## 2020-10-09 NOTE — Telephone Encounter (Signed)
Wants home health wants to discuss at appointment Friday FYI

## 2020-10-10 NOTE — Telephone Encounter (Signed)
ok 

## 2020-10-11 ENCOUNTER — Ambulatory Visit: Payer: Medicare HMO | Admitting: Family

## 2020-10-12 ENCOUNTER — Other Ambulatory Visit: Payer: Self-pay

## 2020-10-12 ENCOUNTER — Encounter: Payer: Self-pay | Admitting: Family

## 2020-10-12 ENCOUNTER — Ambulatory Visit (INDEPENDENT_AMBULATORY_CARE_PROVIDER_SITE_OTHER): Payer: Medicare HMO | Admitting: Family

## 2020-10-12 VITALS — BP 153/74 | HR 59 | Temp 97.4°F | Ht 69.0 in | Wt 195.2 lb

## 2020-10-12 DIAGNOSIS — Z125 Encounter for screening for malignant neoplasm of prostate: Secondary | ICD-10-CM | POA: Diagnosis not present

## 2020-10-12 DIAGNOSIS — N3941 Urge incontinence: Secondary | ICD-10-CM | POA: Diagnosis not present

## 2020-10-12 DIAGNOSIS — I890 Lymphedema, not elsewhere classified: Secondary | ICD-10-CM

## 2020-10-12 DIAGNOSIS — Z09 Encounter for follow-up examination after completed treatment for conditions other than malignant neoplasm: Secondary | ICD-10-CM | POA: Diagnosis not present

## 2020-10-12 NOTE — Progress Notes (Signed)
Subjective:    Patient ID: Ronald [name], male    DOB: [birth year], 75 y.o.   MRN: [id number]  Chief Complaint  Patient presents with  . Hospitalization Follow-up    [place]   . Urinary Frequency    AT NIGHT WETTING THE BED ON DIURETIC     HPI  Today's visit was for Transitional Care Management.  The patient was discharged from [facility] on 09/28/20 with a primary diagnosis of lymphedema and cellulites.   Contact with the patient and/or caregiver, by a clinical staff member, was made on 10/01/20 and was documented as a telephone encounter within the EMR.  Through chart review and discussion with the patient I have determined that management of their condition is of moderate complexity.    Pt went to the ED on 09/25/20 with bilateral lymphedema and cellulitis of bilateral arms. He was given Rocephin . A referral the lymphedema clinic. Ex wife, states he has a hard time driving and can not get to his appointments because of gas prices.   He is having urinary incontinence at night.    Review of Systems  All other systems reviewed and are negative.      Objective:   Physical Exam Vitals reviewed.  Constitutional:      General: He is not in acute distress.    Appearance: He is well-developed.  HENT:     Head: Normocephalic.  Eyes:     General:        Right eye: No discharge.        Left eye: No discharge.     Pupils: Pupils are equal, round, and reactive to light.  Neck:     Thyroid: No thyromegaly.  Cardiovascular:     Rate and Rhythm: Normal rate and regular rhythm.     Heart sounds: Normal heart sounds. No murmur heard.   Pulmonary:     Effort: Pulmonary effort is normal. No respiratory distress.     Breath sounds: Rhonchi present. No wheezing.  Abdominal:     General: Bowel sounds are normal. There is no distension.     Palpations: Abdomen is soft.     Tenderness: There is no abdominal tenderness.  Musculoskeletal:         General: No tenderness. Normal range of motion.     Cervical back: Normal range of motion and neck supple.     Comments: 3+ lymphedema in bilateral arms   Skin:    General: Skin is warm and dry.     Findings: No erythema or rash.  Neurological:     Mental Status: He is alert and oriented to person, place, and time.     Cranial Nerves: No cranial nerve deficit.     Deep Tendon Reflexes: Reflexes are normal and symmetric.  Psychiatric:        Behavior: Behavior normal.        Thought Content: Thought content normal.        Judgment: Judgment normal.      BP (!) 153/74   Pulse (!) 59   Temp (!) 97.4 F (36.3 C) (Temporal)   Ht 5' 9" (1.753 m)   Wt 195 lb 3.2 oz (88.5 kg)   SpO2 92%   BMI 28.83 kg/m       Assessment & Plan:  [name] comes in today with chief complaint of Hospitalization Follow-up ([place] ) and Urinary Frequency (AT [place]  THE BED ON DIURETIC )   Diagnosis and orders addressed:  1. Lymphedema of both upper extremities - Ambulatory referral to [place] with Differential/Platelet  2. Hospital discharge follow-up - Ambulatory referral to [place] with Differential/Platelet  3. Urge incontinence - Ambulatory referral to Urology - CMP14+EGFR - CBC with Differential/Platelet - PSA, total and free   Labs pending Health Maintenance reviewed Diet and exercise encouraged  Follow up plan: 3 months    [name], FNP

## 2020-10-12 NOTE — Patient Instructions (Signed)
Lymphedema  Lymphedema is swelling that is caused by the abnormal collection of lymph in the tissues under the skin. Lymph is excess fluid from the tissues in your body that is removed through the lymphatic system. This system is part of your body's defense system (immune system) and includes lymph nodes and lymph vessels. The lymph vessels collect and carry the excess fluid, fats, proteins, and waste from the tissues of the body to the bloodstream. This system also works to clean and remove bacteria and waste products from the body. Lymphedema occurs when the lymphatic system is blocked. When the lymph vessels or lymph nodes are blocked or damaged, lymph does not drain properly. This causes an abnormal buildup of lymph, which leads to swelling in the affected area. This may include the trunk area, or an arm or leg. Lymphedema cannot be cured by medicines, but various methods can be used to help reduce the swelling. What are the causes? The cause of this condition depends on the type of lymphedema that you have.  Primary lymphedema is caused by the absence of lymph vessels or having abnormal lymph vessels at birth.  Secondary lymphedema occurs when lymph vessels are blocked or damaged. Secondary lymphedema is more common. Common causes of lymph vessel blockage include: ? Skin infection, such as cellulitis. ? Infection by parasites (filariasis). ? Injury. ? Radiation therapy. ? Cancer. ? Formation of scar tissue. ? Surgery. What are the signs or symptoms? Symptoms of this condition include:  Swelling of the arm or leg.  A heavy or tight feeling in the arm or leg.  Swelling of the feet, toes, or fingers. Shoes or rings may fit more tightly than before.  Redness of the skin over the affected area.  Limited movement of the affected limb.  Sensitivity to touch or discomfort in the affected limb. How is this diagnosed? This condition may be diagnosed based on:  Your symptoms and medical  history.  A physical exam.  Bioimpedance spectroscopy. In this test, painless electrical currents are used to measure fluid levels in your body.  Imaging tests, such as: ? MRI. ? CT scan. ? Duplex ultrasound. This test uses sound waves to produce images of the vessels and the blood flow on a screen. ? Lymphoscintigraphy. In this test, a low dose of a radioactive substance is injected to trace the flow of lymph through your lymph vessels. ? Lymphangiography. In this test, a contrast dye is injected into the lymph vessel to help show blockages. How is this treated? If an underlying condition is causing the lymphedema, that condition will be treated. For example, antibiotic medicines may be used to treat an infection. Treatment for this condition will depend on the cause of your lymphedema. Treatment may include:  Complete decongestive therapy (CDT). This is done by a certified lymphedema therapist to reduce fluid congestion. This therapy includes: ? Skin care. ? Compression wrapping of the affected area. ? Manual lymph drainage. This is a special massage technique that promotes lymph drainage out of a limb. ? Specific exercises. Certain exercises can help fluid move out of the affected limb.  Compression. Various methods may be used to apply pressure to the affected limb to reduce the swelling. They include: ? Wearing compression stockings or sleeves on the affected limb. ? Wrapping the affected limb with special bandages.  Surgery. This is usually done for severe cases only. For example, surgery may be done if you have trouble moving the limb or if the swelling does not  get better with other treatments.   Follow these instructions at home: Self-care  The affected area is more likely to become injured or infected. Take these steps to help prevent infection: ? Keep the affected area clean and dry. ? Use approved creams or lotions to keep the skin moisturized. ? Protect your skin from  cuts:  Use gloves while cooking or gardening.  Do not walk barefoot.  If you shave the affected area, use an Copy.  Do not wear tight clothes, shoes, or jewelry.  Eat a healthy diet that includes a lot of fruits and vegetables. Activity  Do exercises as told by your health care provider.  Do not sit with your legs crossed.  When possible, keep the affected limb raised (elevated) above the level of your heart.  Avoid carrying things with an arm that is affected by lymphedema. General instructions  Wear compression stockings or sleeves as told by your health care provider.  Note any changes in size of the affected limb. You may be instructed to take regular measurements and keep track of them.  Take over-the-counter and prescription medicines only as told by your health care provider.  If you were prescribed an antibiotic medicine, take or apply it as told by your health care provider. Do not stop using the antibiotic even if you start to feel better or if your condition improves.  Do not use heating pads or ice packs on the affected area.  Avoid having blood draws, IV insertions, or blood pressure checks on the affected limb.  Keep all follow-up visits. This is important. Contact a health care provider if you:  Continue to have swelling in your limb.  Have fluid leaking from the skin of your swollen limb.  Have a cut that does not heal.  Have redness or pain in the affected area.  Develop purplish spots, rash, blisters, or sores (lesions) on your affected limb. Get help right away if you:  Have new swelling in your limb that starts suddenly.  Have shortness of breath or chest pain.  Have a fever or chills. These symptoms may represent a serious problem that is an emergency. Do not wait to see if the symptoms will go away. Get medical help right away. Call your local emergency services (911 in the [place] Do not drive yourself to the  hospital. Summary  Lymphedema is swelling that is caused by the abnormal collection of lymph in the tissues under the skin.  Lymph is fluid from the tissues in your body that is removed through the lymphatic system. This system collects and carries excess fluid, fats, proteins, and wastes from the tissues of the body to the bloodstream.  Lymphedema causes swelling, pain, and redness in the affected area. This may include the trunk area, or an arm or leg.  Treatment for this condition may depend on the cause of your lymphedema. Treatment may include treating the underlying cause, complete decongestive therapy (CDT), compression methods, or surgery. This information is not intended to replace advice given to you by your health care provider. Make sure you discuss any questions you have with your health care provider. Document Revised: 03/21/2020 Document Reviewed: 03/21/2020 Elsevier Patient Education  2021 [place]. Urinary Incontinence  Urinary incontinence refers to a condition in which a person is unable to control where and when to pass urine. A person with this condition will urinate when he or she does not mean to (involuntarily). What are the causes? This condition may be  caused by:  Medicines.  Infections.  Constipation.  Overactive bladder muscles.  Weak bladder muscles.  Weak pelvic floor muscles. These muscles provide support for the bladder, intestine, and, in women, the uterus.  Enlarged prostate in men. The prostate is a gland near the bladder. When it gets too big, it can pinch the urethra. With the urethra blocked, the bladder can weaken and lose the ability to empty properly.  Surgery.  Emotional factors, such as anxiety, stress, or post-traumatic stress disorder (PTSD).  Pelvic organ prolapse. This happens in women when organs shift out of place and into the vagina. This shift can prevent the bladder and urethra from working properly. What increases the  risk? The following factors may make you more likely to develop this condition:  Older age.  Obesity and physical inactivity.  Pregnancy and childbirth.  Menopause.  Diseases that affect the nerves or spinal cord (neurological diseases).  Long-term (chronic) coughing. This can increase pressure on the bladder and pelvic floor muscles. What are the signs or symptoms? Symptoms may vary depending on the type of urinary incontinence you have. They include:  A sudden urge to urinate, but passing urine involuntarily before you can get to a bathroom (urge incontinence).  Suddenly passing urine with any activity that forces urine to pass, such as coughing, laughing, exercise, or sneezing (stress incontinence).  Needing to urinate often, but urinating only a small amount, or constantly dribbling urine (overflow incontinence).  Urinating because you cannot get to the bathroom in time due to a physical disability, such as arthritis or injury, or communication and thinking problems, such as Alzheimer disease (functional incontinence). How is this diagnosed? This condition may be diagnosed based on:  Your medical history.  A physical exam.  Tests, such as: ? Urine tests. ? X-rays of your kidney and bladder. ? Ultrasound. ? CT scan. ? Cystoscopy. In this procedure, a health care provider inserts a tube with a light and camera (cystoscope) through the urethra and into the bladder in order to check for problems. ? Urodynamic testing. These tests assess how well the bladder, urethra, and sphincter can store and release urine. There are different types of urodynamic tests, and they vary depending on what the test is measuring. To help diagnose your condition, your health care provider may recommend that you keep a log of when you urinate and how much you urinate. How is this treated? Treatment for this condition depends on the type of incontinence that you have and its cause. Treatment may  include:  Lifestyle changes, such as: ? Quitting smoking. ? Maintaining a healthy weight. ? Staying active. Try to get 150 minutes of moderate-intensity exercise every week. Ask your health care provider which activities are safe for you. ? Eating a healthy diet.  Avoid high-fat foods, like fried foods.  Avoid refined carbohydrates like white bread and white rice.  Limit how much alcohol and caffeine you drink.  Increase your fiber intake. Foods such as fresh fruits, vegetables, beans, and whole grains are healthy sources of fiber.  Pelvic floor muscle exercises.  Bladder training, such as lengthening the amount of time between bathroom breaks, or using the bathroom at regular intervals.  Using techniques to suppress bladder urges. This can include distraction techniques or controlled breathing exercises.  Medicines to relax the bladder muscles and prevent bladder spasms.  Medicines to help slow or prevent the growth of a man's prostate.  Botox injections. These can help relax the bladder muscles.  Using pulses  of electricity to help change bladder reflexes (electrical nerve stimulation).  For women, using a medical device to prevent urine leaks. This is a small, tampon-like, disposable device that is inserted into the urethra.  Injecting collagen or carbon beads (bulking agents) into the urinary sphincter. These can help thicken tissue and close the bladder opening.  Surgery. Follow these instructions at home: Lifestyle  Limit alcohol and caffeine. These can fill your bladder quickly and irritate it.  Keep yourself clean to help prevent odors and skin damage. Ask your doctor about special skin creams and cleansers that can protect the skin from urine.  Consider wearing pads or adult diapers. Make sure to change them regularly, and always change them right after experiencing incontinence. General instructions  Take over-the-counter and prescription medicines only as told  by your health care provider.  Use the bathroom about every 3-4 hours, even if you do not feel the need to urinate. Try to empty your bladder completely every time. After urinating, wait a minute. Then try to urinate again.  Make sure you are in a relaxed position while urinating.  If your incontinence is caused by nerve problems, keep a log of the medicines you take and the times you go to the bathroom.  Keep all follow-up visits as told by your health care provider. This is important. Contact a health care provider if:  You have pain that gets worse.  Your incontinence gets worse. Get help right away if:  You have a fever or chills.  You are unable to urinate.  You have redness in your groin area or down your legs. Summary  Urinary incontinence refers to a condition in which a person is unable to control where and when to pass urine.  This condition may be caused by medicines, infection, weak bladder muscles, weak pelvic floor muscles, enlargement of the prostate (in men), or surgery.  The following factors increase your risk for developing this condition: older age, obesity, pregnancy and childbirth, menopause, neurological diseases, and chronic coughing.  There are several types of urinary incontinence. They include urge incontinence, stress incontinence, overflow incontinence, and functional incontinence.  This condition is usually treated first with lifestyle and behavioral changes, such as quitting smoking, eating a healthier diet, and doing regular pelvic floor exercises. Other treatment options include medicines, bulking agents, medical devices, electrical nerve stimulation, or surgery. This information is not intended to replace advice given to you by your health care provider. Make sure you discuss any questions you have with your health care provider. Document Revised: 06/05/2017 Document Reviewed: 09/04/2016 Elsevier Patient Education  [place].

## 2020-10-13 LAB — CMP14+EGFR
ALT: 12 IU/L (ref 0–44)
AST: 12 IU/L (ref 0–40)
Albumin/Globulin Ratio: 1.6 (ref 1.2–2.2)
Albumin: 4.2 g/dL (ref 3.7–4.7)
Alkaline Phosphatase: 124 IU/L — ABNORMAL HIGH (ref 44–121)
BUN/Creatinine Ratio: 13 (ref 10–24)
BUN: 14 mg/dL (ref 8–27)
Bilirubin Total: 0.5 mg/dL (ref 0.0–1.2)
CO2: 29 mmol/L (ref 20–29)
Calcium: 9.2 mg/dL (ref 8.6–10.2)
Chloride: 97 mmol/L (ref 96–106)
Creatinine, Ser: 1.05 mg/dL (ref 0.76–1.27)
Globulin, Total: 2.7 g/dL (ref 1.5–4.5)
Glucose: 146 mg/dL — ABNORMAL HIGH (ref 65–99)
Potassium: 4.1 mmol/L (ref 3.5–5.2)
Sodium: 143 mmol/L (ref 134–144)
Total Protein: 6.9 g/dL (ref 6.0–8.5)
eGFR: 75 mL/min/{1.73_m2} (ref >59)

## 2020-10-13 LAB — CBC WITH DIFFERENTIAL/PLATELET
Basophils Absolute: 0.1 10*3/uL (ref 0.0–0.2)
Basos: 1 %
EOS (ABSOLUTE): 0.2 10*3/uL (ref 0.0–0.4)
Eos: 2 %
Hematocrit: 47.9 % (ref 37.5–51.0)
Hemoglobin: 15.9 g/dL (ref 13.0–17.7)
Immature Grans (Abs): 0.1 10*3/uL (ref 0.0–0.1)
Immature Granulocytes: 1 %
Lymphocytes Absolute: 2 10*3/uL (ref 0.7–3.1)
Lymphs: 20 %
MCH: 28.4 pg (ref 26.6–33.0)
MCHC: 33.2 g/dL (ref 31.5–35.7)
MCV: 86 fL (ref 79–97)
Monocytes Absolute: 1.1 10*3/uL — ABNORMAL HIGH (ref 0.1–0.9)
Monocytes: 11 %
Neutrophils Absolute: 6.3 10*3/uL (ref 1.4–7.0)
Neutrophils: 65 %
Platelets: 365 10*3/uL (ref 150–450)
RBC: 5.59 x10E6/uL (ref 4.14–5.80)
RDW: 13.3 % (ref 11.6–15.4)
WBC: 9.6 10*3/uL (ref 3.4–10.8)

## 2020-10-13 LAB — PSA, TOTAL AND FREE
PSA, Free Pct: 11.6 %
PSA, Free: 0.57 ng/mL
Prostate Specific Ag, Serum: 4.9 ng/mL — ABNORMAL HIGH (ref 0.0–4.0)

## 2020-10-16 ENCOUNTER — Encounter: Payer: Self-pay | Admitting: Family Medicine

## 2020-10-16 ENCOUNTER — Ambulatory Visit (HOSPITAL_COMMUNITY): Payer: Medicare HMO | Attending: Internal Medicine | Admitting: Physical Therapy

## 2020-10-16 ENCOUNTER — Other Ambulatory Visit: Payer: Self-pay

## 2020-10-16 DIAGNOSIS — I890 Lymphedema, not elsewhere classified: Secondary | ICD-10-CM | POA: Insufficient documentation
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
## 2020-10-18 ENCOUNTER — Ambulatory Visit (HOSPITAL_COMMUNITY): Payer: Medicare HMO

## 2020-10-18 ENCOUNTER — Encounter (HOSPITAL_COMMUNITY): Payer: Self-pay

## 2020-10-18 ENCOUNTER — Other Ambulatory Visit: Payer: Self-pay

## 2020-10-18 DIAGNOSIS — I890 Lymphedema, not elsewhere classified: Secondary | ICD-10-CM | POA: Diagnosis not present

## 2020-10-18 NOTE — Therapy (Signed)
[facility] [address] [place], [place], [address] Phone: [phone]   Fax:  [phone]  Physical Therapy Treatment  Patient Details  Name: Ronald [name] MRN: [id number] Date of Birth: 04-Jul-1945 Referring Provider (PT): [name]   Encounter Date: 10/18/2020   PT End of Session - 10/18/20 1713    Visit Number 2    Number of Visits 12    Date for PT Re-Evaluation 11/13/20    Authorization Type [employer] MEdic[employer] no auth, no VL    Progress Note Due on Visit 10    PT Start Time 1708    PT Stop Time 1814    PT Time Calculation (min) 66 min    Activity Tolerance Patient tolerated treatment well    Behavior During Therapy [facility] for tasks assessed/performed           Past Medical History:  Diagnosis Date  . Anxiety   . Asthma   . Chronic lower back pain   . COPD (chronic obstructive pulmonary disease) ([place])   . Coronary artery disease    a. NSTEMI 05/2014 s/p DESx2 to LAD at [facility].  . Depression   . Educated about COVID-19 virus infection 03/06/2020  . GERD (gastroesophageal reflux disease)   . High cholesterol   . Hypertension   . NSTEMI (non-ST elevated myocardial infarction) ([place]) 05/2014   with stent placement  . Sleep apnea   . Stroke ([place]) 2017   anyeusym   . TIA (transient ischemic attack)    "they say I've had some mini strokes; don't know when"; denies residual on 06/22/2014)  . Type II diabetes mellitus ([place])   . Ulcerative colitis [facility])     Past Surgical History:  Procedure Laterality Date  . APPENDECTOMY    . BIOPSY  07/20/2020   Procedure: BIOPSY;  Surgeon: Montez Morita, Quillian Quince, MD;  [facility];  Service: Gastroenterology;;  . CARDIAC CATHETERIZATION  1990's X 3  . CHOLECYSTECTOMY    . COLONOSCOPY WITH PROPOFOL N/A 07/20/2020   Procedure: COLONOSCOPY WITH PROPOFOL;  Surgeon: Harvel Quale, MD;  [facility];  Service: Gastroenterology;  Laterality: N/A;  1:15  .  CORONARY ANGIOPLASTY WITH STENT PLACEMENT  05/2014   "2"  . ESOPHAGEAL DILATION N/A 07/20/2020   Procedure: ESOPHAGEAL DILATION;  Surgeon: Harvel Quale, MD;  [facility];  Service: Gastroenterology;  Laterality: N/A;  . ESOPHAGOGASTRODUODENOSCOPY (EGD) WITH PROPOFOL N/A 07/20/2020   Procedure: ESOPHAGOGASTRODUODENOSCOPY (EGD) WITH PROPOFOL;  Surgeon: Harvel Quale, MD;  [facility];  Service: Gastroenterology;  Laterality: N/A;  . LEFT HEART CATH AND CORONARY ANGIOGRAPHY N/A 05/25/2020   Procedure: LEFT HEART CATH AND CORONARY ANGIOGRAPHY;  Surgeon: Martinique, Peter M, MD;  Location: [place] CV LAB;  Service: Cardiovascular;  Laterality: N/A;  . POLYPECTOMY  07/20/2020   Procedure: POLYPECTOMY INTESTINAL;  Surgeon: Harvel Quale, MD;  [facility];  Service: Gastroenterology;;  . TUMOR EXCISION Right ~ 1999   "side of my upper head"    There were no vitals filed for this visit.   Subjective Assessment - 10/18/20 1713    Subjective No reports of pain.  Stated he has began HEP.    Pertinent History PT has had three stents put in he has CAD, DM    Currently in Pain? No/denies  [facility] Adult PT Treatment/Exercise - 10/18/20 0001      Manual Therapy   Manual Therapy Manual Lymphatic Drainage (MLD);Compression Bandaging    Manual therapy comments completed seperat from all other aspects of treatment    Manual Lymphatic Drainage (MLD) BUE axillary to ingunegial no intraaxillary anasomosis    Compression Bandaging Multilayer short stretch bandages to Lt UE with foam                  PT Education - 10/18/20 1824    Education Details Reviewed goals, educated importance of HEP compliance to assist with volume reduction and adherence with wearing compression garments.  Pt stated he could not come 3days a week due to financial issues.  Pt given handout for financial  assistance.  Educated on benefits with 3 days/week compliance for maximal benefits.    Person(s) Educated Patient    Methods Explanation;Handout    Comprehension Verbalized understanding            PT Short Term Goals - 10/16/20 1422      PT SHORT TERM GOAL #1   Title Pt measurement to reduce by 2 cm to allow pain to decrease to no greater than a 3/10    Time 2    Period Weeks    Status New    Target Date 10/30/20             PT Long Term Goals - 10/16/20 1423      PT LONG TERM GOAL #1   Title Pt UE volume to reduce by 3 cm to allow pt pain to decrease to 0/10 and reduce risk of cellulitis    Time 4    Period Weeks    Status New    Target Date 11/13/20      PT LONG TERM GOAL #2   Title Pt to state that he is able to use his hands 25% easier.    Time 4    Period Weeks    Status New      PT LONG TERM GOAL #3   Title PT to have recieved and be using his pump to keep UE and hands from swelling    Time 4    Period Weeks    Status New      PT LONG TERM GOAL #4   Title PT to have recieved and be wearing compression sleeve and hand garment to keep the swelling in his UE from returning.                 Plan - 10/18/20 1827    Clinical Impression Statement Began session reviewing goals, importance of HEP complinace and educated on 3 days a week frequency for maximal benefits.  Pt stated he is unable to pay co-pay with 3days a week frequency, given handout for financial assistance.  Manual lymphedema decongestive technqiues complete followed by multilayer short stretch bandages complete to Lt UE only today.  Instructed to wear til Sunday, remove bandages and roll before next apt, verbalized understanding.    Examination-Activity Limitations Carry;Dressing    Examination-Participation Restrictions Driving;Yard Work    Stability/Clinical Decision Making Evolving/Moderate complexity    Clinical Decision Making Moderate    Rehab Potential Good    PT Frequency 3x / week     PT Duration 4 weeks    PT Treatment/Interventions Manual lymph drainage;Compression bandaging;Patient/family education    PT Next Visit Plan begin total decongestive techniques to B UE no intraaxillary anasomosis due to B  UE involvement .    PT Home Exercise Plan shld flex, abduction, ER, elbow flexion/extension, wrist flexion/extension, diaphragmic breathing and lymphatic squeeze.    Consulted and Agree with Plan of Care Patient           Patient will benefit from skilled therapeutic intervention in order to improve the following deficits and impairments:  Decreased skin integrity,Pain,Increased edema,Decreased range of motion  Visit Diagnosis: Lymphedema, not elsewhere classified     Problem List Patient Active Problem List   Diagnosis Date Noted  . Cellulitis 09/25/2020  . Chest pain 05/23/2020  . Controlled substance agreement signed 04/03/2020  . Benzodiazepine dependence ([place]) 04/03/2020  . Dysphagia 02/27/2020  . Diarrhea 02/27/2020  . Tracheal stenosis 12/09/2019  . Noncompliance 04/01/2019  . Lymphedema 04/01/2019  . CHF (congestive heart failure) ([place]) 08/27/2018  . Aortic atherosclerosis ([place]) 05/11/2017  . Depression 05/05/2017  . GERD (gastroesophageal reflux disease) 01/16/2017  . Diabetic neuropathy ([place]) 01/16/2017  . Anxiety 02/14/2016  . Chronic respiratory failure with hypoxia ([place]) 07/19/2014  . Hypertension associated with diabetes ([place]) 07/19/2014  . Cigarette smoker 07/19/2014  . Chest pain at rest 06/22/2014  . CAD in native artery 06/22/2014  . Diabetes mellitus ([place]) 06/22/2014  . COPD GOLD 3 / still smoking 06/22/2014  . Tobacco abuse 06/22/2014  . Acute encephalopathy 06/22/2014  . Leucocytosis 06/22/2014   Ihor Austin, LPTA/CLT; CBIS [phone]  Aldona Lento 10/18/2020, 6:33 PM  [facility] [address] [place], [place], [address] Phone: [phone]   Fax:   [phone]  Name: Ronald [name] MRN: [id number] Date of Birth: [birth year]

## 2020-10-22 ENCOUNTER — Ambulatory Visit (HOSPITAL_COMMUNITY): Payer: Medicare HMO | Admitting: Physical Therapy

## 2020-10-22 ENCOUNTER — Encounter: Payer: Self-pay | Admitting: Family

## 2020-10-24 ENCOUNTER — Ambulatory Visit (HOSPITAL_COMMUNITY): Payer: Medicare HMO

## 2020-10-24 ENCOUNTER — Telehealth (HOSPITAL_COMMUNITY): Payer: Self-pay

## 2020-10-24 NOTE — Telephone Encounter (Signed)
No show, called and left message concerning missed apt today.  Reminded next apt date and time wiht contact number included if needs to cancel/reschedule apt.    Ihor Austin, LPTA/CLT; Delana Meyer [phone]

## 2020-10-25 ENCOUNTER — Telehealth (HOSPITAL_COMMUNITY): Payer: Self-pay

## 2020-10-25 ENCOUNTER — Ambulatory Visit (HOSPITAL_COMMUNITY): Payer: Medicare HMO

## 2020-10-25 NOTE — Telephone Encounter (Signed)
No show, called and left message concerning missed apt today.  Reminded next apt date and time as well as no show policy details and due to 2 consecutive no-shows will need to schedule 1 at a time.    Ihor Austin, LPTA/CLT; Delana Meyer [phone]

## 2020-10-29 ENCOUNTER — Encounter (HOSPITAL_COMMUNITY): Payer: Self-pay | Admitting: Physical Therapy

## 2020-10-29 ENCOUNTER — Encounter (HOSPITAL_COMMUNITY): Payer: Medicare HMO | Admitting: Physical Therapy

## 2020-10-29 NOTE — Therapy (Signed)
[facility] [place], [place], [address] Phone: [phone]   Fax:  [phone]  Patient Details  Name: Ronald [name] MRN: [id number] Date of Birth: Nov 29, 1945 Referring Provider:  No ref. provider found PHYSICAL THERAPY DISCHARGE SUMMARY  Visits from Start of Care: 1  Current functional level related to goals / functional outcomes: PT more knowlegdable, Exercises    Remaining deficits: Continue edema   Education / Equipment: What lymphedema is , exercises and how it is controlled Plan: Patient agrees to discharge.  Patient goals were not met. Patient is being discharged due to financial reasons.   Rayetta Humphrey, PT CLT [phone]?????     Encounter Date: 10/29/2020 Rayetta Humphrey, PT CLT [phone] 10/29/2020, 10:10 AM  [facility] [place], [place], [address] Phone: [phone]   Fax:  [phone]

## 2020-10-31 ENCOUNTER — Encounter (HOSPITAL_COMMUNITY): Payer: Medicare HMO | Admitting: Physical Therapy

## 2020-10-31 ENCOUNTER — Other Ambulatory Visit: Payer: Self-pay | Admitting: *Deleted

## 2020-10-31 DIAGNOSIS — R0602 Shortness of breath: Secondary | ICD-10-CM

## 2020-10-31 DIAGNOSIS — M7989 Other specified soft tissue disorders: Secondary | ICD-10-CM

## 2020-10-31 DIAGNOSIS — I509 Heart failure, unspecified: Secondary | ICD-10-CM

## 2020-10-31 MED ORDER — FUROSEMIDE 20 MG PO TABS: 20.0000 mg | ORAL_TABLET | Freq: Two times a day (BID) | ORAL | 2 refills | Status: DC

## 2020-11-02 ENCOUNTER — Encounter (HOSPITAL_COMMUNITY): Payer: Medicare HMO

## 2020-11-03 DIAGNOSIS — J439 Emphysema, unspecified: Secondary | ICD-10-CM | POA: Diagnosis not present

## 2020-11-06 ENCOUNTER — Ambulatory Visit (HOSPITAL_COMMUNITY): Payer: Medicare HMO | Admitting: Physical Therapy

## 2020-11-08 ENCOUNTER — Encounter (HOSPITAL_COMMUNITY): Payer: Medicare HMO | Admitting: Physical Therapy

## 2020-11-08 NOTE — Telephone Encounter (Signed)
Brandon from [facility] called clinic stating that he had the pt pump but had called multiple times and the pt has not picked up the phone.  Therapist called pt, no answer, therefore therapist left Brandon's number and asked that they please call him to schedule a time when they can deliver his pump to him.  Rayetta Humphrey, [place] CLT [phone]

## 2020-11-09 ENCOUNTER — Ambulatory Visit (HOSPITAL_COMMUNITY): Payer: Medicare HMO | Admitting: Physical Therapy

## 2020-11-12 ENCOUNTER — Ambulatory Visit: Payer: Medicare HMO | Admitting: Vascular Surgery

## 2020-11-13 ENCOUNTER — Telehealth: Payer: Self-pay | Admitting: Family

## 2020-11-13 ENCOUNTER — Other Ambulatory Visit: Payer: Self-pay | Admitting: Family

## 2020-11-13 DIAGNOSIS — F32A Depression, unspecified: Secondary | ICD-10-CM

## 2020-11-13 DIAGNOSIS — F419 Anxiety disorder, unspecified: Secondary | ICD-10-CM

## 2020-11-13 NOTE — Telephone Encounter (Signed)
Pt is wanting to get samples of trelegy.

## 2020-11-15 ENCOUNTER — Ambulatory Visit (INDEPENDENT_AMBULATORY_CARE_PROVIDER_SITE_OTHER): Payer: Medicare HMO | Admitting: Family Medicine

## 2020-11-15 DIAGNOSIS — J301 Allergic rhinitis due to pollen: Secondary | ICD-10-CM

## 2020-11-15 MED ORDER — FLUTICASONE PROPIONATE 50 MCG/ACT NA SUSP: 2.0000 | Freq: Every day | NASAL | 6 refills | Status: DC

## 2020-11-15 NOTE — Progress Notes (Signed)
   Virtual Visit  Note Due to COVID-19 pandemic this visit was conducted virtually. This visit type was conducted due to national recommendations for restrictions regarding the COVID-19 Pandemic (e.g. social distancing, sheltering in place) in an effort to limit this patient's exposure and mitigate transmission in our community. All issues noted in this document were discussed and addressed.  A physical exam was not performed with this format.  I connected with Ronald [name] on 11/15/20 at 1104 by telephone and verified that I am speaking with the correct person using two identifiers. Ronald [name] is currently located at in his car and his wife is currently with him during the visit. The provider, Gwenlyn Perking, FNP is located in their office at time of visit.  I discussed the limitations, risks, security and privacy concerns of performing an evaluation and management service by telephone and the availability of in person appointments. I also discussed with the patient that there may be a patient responsible charge related to this service. The patient expressed understanding and agreed to proceed.  CC: congestion  History and Present Illness:  HPI History was provided by Ronald [name] and his wife. Coulter reports nasal congestion, dry cough, postnasal drip, and sneezing for 4-5 days. He has a history of seasonal allergies to pollen and has been outside a lot recently. He denies body aches, chills, fever, nausea, vomiting, diarrhea, headache, sore throat, shortness of breath, chest pain, or wheezing. He has been taking claritin daily for his allergies. Denies known exposure to Covid.    ROS As per HPI.    Observations/Objective: Alert and oriented x 3. Able to speak in full sentences without difficulty.    Assessment and Plan: [facility] was seen today for nasal congestion.  Diagnoses and all orders for this visit:  Seasonal allergic rhinitis due to pollen Continue claritin daily. Add flonase daily.  Return to office for new or worsening symptoms, or if symptoms persist.  -     fluticasone (FLONASE) 50 MCG/ACT nasal spray; Place 2 sprays into both nostrils daily.    Follow Up Instructions: As needed.     I discussed the assessment and treatment plan with the patient. The patient was provided an opportunity to ask questions and all were answered. The patient agreed with the plan and demonstrated an understanding of the instructions.   The patient was advised to call back or seek an in-person evaluation if the symptoms worsen or if the condition fails to improve as anticipated.  The above assessment and management plan was discussed with the patient. The patient verbalized understanding of and has agreed to the management plan. Patient is aware to call the clinic if symptoms persist or worsen. Patient is aware when to return to the clinic for a follow-up visit. Patient educated on when it is appropriate to go to the emergency department.   Time call ended:  1116  I provided 12 minutes of  non face-to-face time during this encounter.    Gwenlyn Perking, FNP

## 2020-11-20 DIAGNOSIS — I972 Postmastectomy lymphedema syndrome: Secondary | ICD-10-CM | POA: Diagnosis not present

## 2020-11-20 DIAGNOSIS — I890 Lymphedema, not elsewhere classified: Secondary | ICD-10-CM | POA: Diagnosis not present

## 2020-11-24 ENCOUNTER — Other Ambulatory Visit: Payer: Self-pay | Admitting: Family

## 2020-11-24 DIAGNOSIS — K219 Gastro-esophageal reflux disease without esophagitis: Secondary | ICD-10-CM

## 2020-11-29 DIAGNOSIS — L0390 Cellulitis, unspecified: Secondary | ICD-10-CM | POA: Diagnosis not present

## 2020-11-29 DIAGNOSIS — I10 Essential (primary) hypertension: Secondary | ICD-10-CM | POA: Diagnosis not present

## 2020-11-29 DIAGNOSIS — R0902 Hypoxemia: Secondary | ICD-10-CM | POA: Diagnosis not present

## 2020-11-29 DIAGNOSIS — J398 Other specified diseases of upper respiratory tract: Secondary | ICD-10-CM | POA: Diagnosis not present

## 2020-11-29 DIAGNOSIS — I2510 Atherosclerotic heart disease of native coronary artery without angina pectoris: Secondary | ICD-10-CM | POA: Diagnosis not present

## 2020-11-29 DIAGNOSIS — Z930 Tracheostomy status: Secondary | ICD-10-CM | POA: Diagnosis not present

## 2020-11-29 DIAGNOSIS — I890 Lymphedema, not elsewhere classified: Secondary | ICD-10-CM | POA: Diagnosis not present

## 2020-11-29 DIAGNOSIS — K5190 Ulcerative colitis, unspecified, without complications: Secondary | ICD-10-CM | POA: Diagnosis not present

## 2020-11-29 DIAGNOSIS — J449 Chronic obstructive pulmonary disease, unspecified: Secondary | ICD-10-CM | POA: Diagnosis not present

## 2020-11-29 DIAGNOSIS — G4733 Obstructive sleep apnea (adult) (pediatric): Secondary | ICD-10-CM | POA: Diagnosis not present

## 2020-12-04 DIAGNOSIS — J439 Emphysema, unspecified: Secondary | ICD-10-CM | POA: Diagnosis not present

## 2020-12-05 ENCOUNTER — Other Ambulatory Visit: Payer: Self-pay | Admitting: Family

## 2020-12-05 DIAGNOSIS — E785 Hyperlipidemia, unspecified: Secondary | ICD-10-CM

## 2020-12-06 DIAGNOSIS — K5190 Ulcerative colitis, unspecified, without complications: Secondary | ICD-10-CM | POA: Diagnosis not present

## 2020-12-06 DIAGNOSIS — J386 Stenosis of larynx: Secondary | ICD-10-CM | POA: Diagnosis not present

## 2020-12-06 DIAGNOSIS — I890 Lymphedema, not elsewhere classified: Secondary | ICD-10-CM | POA: Diagnosis not present

## 2020-12-06 DIAGNOSIS — L0390 Cellulitis, unspecified: Secondary | ICD-10-CM | POA: Diagnosis not present

## 2020-12-06 DIAGNOSIS — R0902 Hypoxemia: Secondary | ICD-10-CM | POA: Diagnosis not present

## 2020-12-06 DIAGNOSIS — G4733 Obstructive sleep apnea (adult) (pediatric): Secondary | ICD-10-CM | POA: Diagnosis not present

## 2020-12-06 DIAGNOSIS — Z930 Tracheostomy status: Secondary | ICD-10-CM | POA: Diagnosis not present

## 2020-12-06 DIAGNOSIS — I2510 Atherosclerotic heart disease of native coronary artery without angina pectoris: Secondary | ICD-10-CM | POA: Diagnosis not present

## 2020-12-06 DIAGNOSIS — J398 Other specified diseases of upper respiratory tract: Secondary | ICD-10-CM | POA: Diagnosis not present

## 2020-12-06 DIAGNOSIS — J449 Chronic obstructive pulmonary disease, unspecified: Secondary | ICD-10-CM | POA: Diagnosis not present

## 2020-12-06 DIAGNOSIS — I10 Essential (primary) hypertension: Secondary | ICD-10-CM | POA: Diagnosis not present

## 2020-12-07 ENCOUNTER — Ambulatory Visit: Payer: Medicare HMO | Admitting: Urology

## 2020-12-11 ENCOUNTER — Telehealth: Payer: Self-pay | Admitting: Family

## 2020-12-11 NOTE — Telephone Encounter (Signed)
Pt aware.

## 2020-12-11 NOTE — Telephone Encounter (Signed)
Samples up front to pick up. 

## 2020-12-12 ENCOUNTER — Ambulatory Visit: Payer: Medicare HMO | Admitting: Vascular Surgery

## 2020-12-19 ENCOUNTER — Telehealth: Payer: Self-pay | Admitting: Family Medicine

## 2020-12-19 MED ORDER — ISOSORBIDE MONONITRATE ER 30 MG PO TB24: 30.0000 mg | ORAL_TABLET | Freq: Every day | ORAL | 0 refills | Status: DC

## 2020-12-19 NOTE — Telephone Encounter (Signed)
   1. Which medications need to be refilled? (please list name of each medication and dose if known) Isosorbide 30 mg   2. Which pharmacy/location (including street and city if local pharmacy) is medication to be sent to? [place]   3. Do they need a 30 day or 90 day supply?

## 2020-12-19 NOTE — Telephone Encounter (Signed)
Medication refill request approved and sent to pharmacy. Pt needs f/u appt for further refills.

## 2020-12-21 ENCOUNTER — Emergency Department (HOSPITAL_COMMUNITY): Payer: Medicare HMO

## 2020-12-21 ENCOUNTER — Inpatient Hospital Stay (HOSPITAL_COMMUNITY)
Admission: EM | Admit: 2020-12-21 | Discharge: 2020-12-23 | DRG: 177 | Disposition: A | Discharge status: Home / Self Care | Payer: Medicare HMO | Source: Ambulatory Visit | Attending: Internal Medicine | Admitting: Internal Medicine

## 2020-12-21 ENCOUNTER — Other Ambulatory Visit: Payer: Self-pay

## 2020-12-21 ENCOUNTER — Encounter (HOSPITAL_COMMUNITY): Payer: Self-pay | Admitting: Internal Medicine

## 2020-12-21 DIAGNOSIS — E1165 Type 2 diabetes mellitus with hyperglycemia: Secondary | ICD-10-CM | POA: Diagnosis present

## 2020-12-21 DIAGNOSIS — Z823 Family history of stroke: Secondary | ICD-10-CM

## 2020-12-21 DIAGNOSIS — R079 Chest pain, unspecified: Secondary | ICD-10-CM | POA: Diagnosis not present

## 2020-12-21 DIAGNOSIS — J441 Chronic obstructive pulmonary disease with (acute) exacerbation: Secondary | ICD-10-CM | POA: Diagnosis present

## 2020-12-21 DIAGNOSIS — U071 COVID-19: Secondary | ICD-10-CM | POA: Diagnosis not present

## 2020-12-21 DIAGNOSIS — I2510 Atherosclerotic heart disease of native coronary artery without angina pectoris: Secondary | ICD-10-CM | POA: Diagnosis not present

## 2020-12-21 DIAGNOSIS — E782 Mixed hyperlipidemia: Secondary | ICD-10-CM

## 2020-12-21 DIAGNOSIS — Z28310 Unvaccinated for covid-19: Secondary | ICD-10-CM

## 2020-12-21 DIAGNOSIS — D72829 Elevated white blood cell count, unspecified: Secondary | ICD-10-CM

## 2020-12-21 DIAGNOSIS — F32A Depression, unspecified: Secondary | ICD-10-CM | POA: Diagnosis present

## 2020-12-21 DIAGNOSIS — Z9981 Dependence on supplemental oxygen: Secondary | ICD-10-CM

## 2020-12-21 DIAGNOSIS — R778 Other specified abnormalities of plasma proteins: Secondary | ICD-10-CM | POA: Diagnosis not present

## 2020-12-21 DIAGNOSIS — F17210 Nicotine dependence, cigarettes, uncomplicated: Secondary | ICD-10-CM | POA: Diagnosis present

## 2020-12-21 DIAGNOSIS — F419 Anxiety disorder, unspecified: Secondary | ICD-10-CM | POA: Diagnosis present

## 2020-12-21 DIAGNOSIS — J9621 Acute and chronic respiratory failure with hypoxia: Secondary | ICD-10-CM

## 2020-12-21 DIAGNOSIS — I700 Atherosclerosis of aorta: Secondary | ICD-10-CM | POA: Diagnosis present

## 2020-12-21 DIAGNOSIS — I517 Cardiomegaly: Secondary | ICD-10-CM | POA: Diagnosis not present

## 2020-12-21 DIAGNOSIS — R001 Bradycardia, unspecified: Secondary | ICD-10-CM | POA: Diagnosis not present

## 2020-12-21 DIAGNOSIS — Z8249 Family history of ischemic heart disease and other diseases of the circulatory system: Secondary | ICD-10-CM

## 2020-12-21 DIAGNOSIS — Z806 Family history of leukemia: Secondary | ICD-10-CM

## 2020-12-21 DIAGNOSIS — Z7951 Long term (current) use of inhaled steroids: Secondary | ICD-10-CM

## 2020-12-21 DIAGNOSIS — J449 Chronic obstructive pulmonary disease, unspecified: Secondary | ICD-10-CM

## 2020-12-21 DIAGNOSIS — R0602 Shortness of breath: Secondary | ICD-10-CM | POA: Diagnosis not present

## 2020-12-21 DIAGNOSIS — Z8673 Personal history of transient ischemic attack (TIA), and cerebral infarction without residual deficits: Secondary | ICD-10-CM

## 2020-12-21 DIAGNOSIS — Z888 Allergy status to other drugs, medicaments and biological substances status: Secondary | ICD-10-CM

## 2020-12-21 DIAGNOSIS — I10 Essential (primary) hypertension: Secondary | ICD-10-CM | POA: Diagnosis present

## 2020-12-21 DIAGNOSIS — Z955 Presence of coronary angioplasty implant and graft: Secondary | ICD-10-CM

## 2020-12-21 DIAGNOSIS — J1282 Pneumonia due to coronavirus disease 2019: Secondary | ICD-10-CM | POA: Diagnosis present

## 2020-12-21 DIAGNOSIS — I890 Lymphedema, not elsewhere classified: Secondary | ICD-10-CM | POA: Diagnosis present

## 2020-12-21 DIAGNOSIS — E785 Hyperlipidemia, unspecified: Secondary | ICD-10-CM

## 2020-12-21 DIAGNOSIS — I248 Other forms of acute ischemic heart disease: Secondary | ICD-10-CM | POA: Diagnosis present

## 2020-12-21 DIAGNOSIS — G8929 Other chronic pain: Secondary | ICD-10-CM | POA: Diagnosis present

## 2020-12-21 DIAGNOSIS — R7989 Other specified abnormal findings of blood chemistry: Secondary | ICD-10-CM | POA: Diagnosis present

## 2020-12-21 DIAGNOSIS — Z825 Family history of asthma and other chronic lower respiratory diseases: Secondary | ICD-10-CM

## 2020-12-21 DIAGNOSIS — Z79899 Other long term (current) drug therapy: Secondary | ICD-10-CM

## 2020-12-21 DIAGNOSIS — Z7982 Long term (current) use of aspirin: Secondary | ICD-10-CM

## 2020-12-21 DIAGNOSIS — J9601 Acute respiratory failure with hypoxia: Secondary | ICD-10-CM | POA: Diagnosis present

## 2020-12-21 DIAGNOSIS — J440 Chronic obstructive pulmonary disease with acute lower respiratory infection: Secondary | ICD-10-CM | POA: Diagnosis present

## 2020-12-21 DIAGNOSIS — Z720 Tobacco use: Secondary | ICD-10-CM | POA: Diagnosis present

## 2020-12-21 DIAGNOSIS — Z801 Family history of malignant neoplasm of trachea, bronchus and lung: Secondary | ICD-10-CM

## 2020-12-21 DIAGNOSIS — E7800 Pure hypercholesterolemia, unspecified: Secondary | ICD-10-CM | POA: Diagnosis present

## 2020-12-21 DIAGNOSIS — I160 Hypertensive urgency: Secondary | ICD-10-CM | POA: Diagnosis present

## 2020-12-21 DIAGNOSIS — E876 Hypokalemia: Secondary | ICD-10-CM | POA: Diagnosis present

## 2020-12-21 DIAGNOSIS — M545 Low back pain, unspecified: Secondary | ICD-10-CM | POA: Diagnosis present

## 2020-12-21 DIAGNOSIS — I252 Old myocardial infarction: Secondary | ICD-10-CM

## 2020-12-21 DIAGNOSIS — E1140 Type 2 diabetes mellitus with diabetic neuropathy, unspecified: Secondary | ICD-10-CM | POA: Diagnosis present

## 2020-12-21 DIAGNOSIS — J398 Other specified diseases of upper respiratory tract: Secondary | ICD-10-CM | POA: Diagnosis present

## 2020-12-21 DIAGNOSIS — K219 Gastro-esophageal reflux disease without esophagitis: Secondary | ICD-10-CM | POA: Diagnosis present

## 2020-12-21 LAB — CBC
HCT: 48 % (ref 39.0–52.0)
Hemoglobin: 16 g/dL (ref 13.0–17.0)
MCH: 29.5 pg (ref 26.0–34.0)
MCHC: 33.3 g/dL (ref 30.0–36.0)
MCV: 88.4 fL (ref 80.0–100.0)
Platelets: 339 10*3/uL (ref 150–400)
RBC: 5.43 MIL/uL (ref 4.22–5.81)
RDW: 13.5 % (ref 11.5–15.5)
WBC: 11.6 10*3/uL — ABNORMAL HIGH (ref 4.0–10.5)
nRBC: 0 % (ref 0.0–0.2)

## 2020-12-21 LAB — BASIC METABOLIC PANEL
Anion gap: 8 (ref 5–15)
BUN: 14 mg/dL (ref 8–23)
CO2: 32 mmol/L (ref 22–32)
Calcium: 8.6 mg/dL — ABNORMAL LOW (ref 8.9–10.3)
Chloride: 97 mmol/L — ABNORMAL LOW (ref 98–111)
Creatinine, Ser: 1.03 mg/dL (ref 0.61–1.24)
GFR, Estimated: >60 mL/min (ref >60)
Glucose, Bld: 215 mg/dL — ABNORMAL HIGH (ref 70–99)
Potassium: 3.3 mmol/L — ABNORMAL LOW (ref 3.5–5.1)
Sodium: 137 mmol/L (ref 135–145)

## 2020-12-21 LAB — HEPATIC FUNCTION PANEL
ALT: 16 U/L (ref 0–44)
AST: 17 U/L (ref 15–41)
Albumin: 3.8 g/dL (ref 3.5–5.0)
Alkaline Phosphatase: 109 U/L (ref 38–126)
Bilirubin, Direct: 0.1 mg/dL (ref 0.0–0.2)
Indirect Bilirubin: 0.8 mg/dL (ref 0.3–0.9)
Total Bilirubin: 0.9 mg/dL (ref 0.3–1.2)
Total Protein: 7.2 g/dL (ref 6.5–8.1)

## 2020-12-21 LAB — GLUCOSE, CAPILLARY: Glucose-Capillary: 169 mg/dL — ABNORMAL HIGH (ref 70–99)

## 2020-12-21 LAB — RESP PANEL BY RT-PCR (FLU A&B, COVID) ARPGX2
Influenza A by PCR: NEGATIVE
Influenza B by PCR: NEGATIVE
SARS Coronavirus 2 by RT PCR: POSITIVE — AB

## 2020-12-21 LAB — TROPONIN I (HIGH SENSITIVITY)
Troponin I (High Sensitivity): 304 ng/L (ref <18)
Troponin I (High Sensitivity): 287 ng/L (ref <18)

## 2020-12-21 LAB — BRAIN NATRIURETIC PEPTIDE: B Natriuretic Peptide: 364 pg/mL — ABNORMAL HIGH (ref 0.0–100.0)

## 2020-12-21 MED ORDER — SODIUM CHLORIDE 0.9 % IV SOLN
100.0000 mg | INTRAVENOUS | Status: AC
  Administered 2020-12-21 (×2): 100 mg via INTRAVENOUS
  Filled 2020-12-21: qty 20

## 2020-12-21 MED ORDER — HYDROCOD POLST-CPM POLST ER 10-8 MG/5ML PO SUER: 5.0000 mL | Freq: Two times a day (BID) | ORAL | Status: DC | PRN

## 2020-12-21 MED ORDER — ESCITALOPRAM OXALATE 10 MG PO TABS
20.0000 mg | ORAL_TABLET | Freq: Every day | ORAL | Status: DC
  Administered 2020-12-22 – 2020-12-23 (×2): 20 mg via ORAL
  Filled 2020-12-21 (×2): qty 2

## 2020-12-21 MED ORDER — ACETAMINOPHEN 325 MG PO TABS
650.0000 mg | ORAL_TABLET | Freq: Four times a day (QID) | ORAL | Status: DC | PRN
  Administered 2020-12-22: 650 mg via ORAL
  Filled 2020-12-21: qty 2

## 2020-12-21 MED ORDER — ZINC SULFATE 220 (50 ZN) MG PO CAPS
220.0000 mg | ORAL_CAPSULE | Freq: Every day | ORAL | Status: DC
  Administered 2020-12-21 – 2020-12-23 (×3): 220 mg via ORAL
  Filled 2020-12-21 (×3): qty 1

## 2020-12-21 MED ORDER — ASPIRIN 325 MG PO TABS
325.0000 mg | ORAL_TABLET | Freq: Once | ORAL | Status: AC
  Administered 2020-12-21: 325 mg via ORAL
  Filled 2020-12-21: qty 1

## 2020-12-21 MED ORDER — LOSARTAN POTASSIUM 50 MG PO TABS
100.0000 mg | ORAL_TABLET | Freq: Every day | ORAL | Status: DC
  Administered 2020-12-22 – 2020-12-23 (×2): 100 mg via ORAL
  Filled 2020-12-21 (×2): qty 2

## 2020-12-21 MED ORDER — ALBUTEROL SULFATE HFA 108 (90 BASE) MCG/ACT IN AERS
2.0000 | INHALATION_SPRAY | Freq: Once | RESPIRATORY_TRACT | Status: AC
  Administered 2020-12-21: 2 via RESPIRATORY_TRACT
  Filled 2020-12-21: qty 6.7

## 2020-12-21 MED ORDER — ATORVASTATIN CALCIUM 40 MG PO TABS
40.0000 mg | ORAL_TABLET | Freq: Every evening | ORAL | Status: DC
  Administered 2020-12-21 – 2020-12-22 (×2): 40 mg via ORAL
  Filled 2020-12-21 (×2): qty 1

## 2020-12-21 MED ORDER — SODIUM CHLORIDE 0.9 % IV SOLN
100.0000 mg | Freq: Every day | INTRAVENOUS | Status: DC
  Administered 2020-12-22 – 2020-12-23 (×2): 100 mg via INTRAVENOUS
  Filled 2020-12-21 (×3): qty 20

## 2020-12-21 MED ORDER — NICOTINE 14 MG/24HR TD PT24
14.0000 mg | MEDICATED_PATCH | Freq: Every day | TRANSDERMAL | Status: DC
  Administered 2020-12-22 – 2020-12-23 (×2): 14 mg via TRANSDERMAL
  Filled 2020-12-21 (×2): qty 1

## 2020-12-21 MED ORDER — AMLODIPINE BESYLATE 5 MG PO TABS
5.0000 mg | ORAL_TABLET | Freq: Every day | ORAL | Status: DC
  Administered 2020-12-22 – 2020-12-23 (×2): 5 mg via ORAL
  Filled 2020-12-21 (×2): qty 1

## 2020-12-21 MED ORDER — ASCORBIC ACID 500 MG PO TABS
500.0000 mg | ORAL_TABLET | Freq: Every day | ORAL | Status: DC
  Administered 2020-12-21 – 2020-12-23 (×3): 500 mg via ORAL
  Filled 2020-12-21 (×3): qty 1

## 2020-12-21 MED ORDER — INSULIN ASPART 100 UNIT/ML IJ SOLN: 0.0000 [IU] | Freq: Every day | INTRAMUSCULAR | Status: DC

## 2020-12-21 MED ORDER — INSULIN ASPART 100 UNIT/ML IJ SOLN: 0.0000 [IU] | Freq: Three times a day (TID) | INTRAMUSCULAR | Status: DC

## 2020-12-21 MED ORDER — ONDANSETRON HCL 4 MG/2ML IJ SOLN: 4.0000 mg | Freq: Four times a day (QID) | INTRAMUSCULAR | Status: DC | PRN

## 2020-12-21 MED ORDER — POTASSIUM CHLORIDE CRYS ER 20 MEQ PO TBCR
40.0000 meq | EXTENDED_RELEASE_TABLET | Freq: Once | ORAL | Status: AC
  Administered 2020-12-21: 40 meq via ORAL
  Filled 2020-12-21: qty 2

## 2020-12-21 MED ORDER — ASPIRIN EC 81 MG PO TBEC
81.0000 mg | DELAYED_RELEASE_TABLET | Freq: Every day | ORAL | Status: DC
  Administered 2020-12-22 – 2020-12-23 (×2): 81 mg via ORAL
  Filled 2020-12-21 (×2): qty 1

## 2020-12-21 MED ORDER — MIRTAZAPINE 15 MG PO TABS
15.0000 mg | ORAL_TABLET | Freq: Every day | ORAL | Status: DC
  Administered 2020-12-21 – 2020-12-22 (×2): 15 mg via ORAL
  Filled 2020-12-21 (×2): qty 1

## 2020-12-21 MED ORDER — IOHEXOL 350 MG/ML SOLN
100.0000 mL | Freq: Once | INTRAVENOUS | Status: AC | PRN
  Administered 2020-12-21: 100 mL via INTRAVENOUS

## 2020-12-21 MED ORDER — ENOXAPARIN SODIUM 40 MG/0.4ML IJ SOSY
40.0000 mg | PREFILLED_SYRINGE | INTRAMUSCULAR | Status: DC
  Administered 2020-12-21 – 2020-12-22 (×2): 40 mg via SUBCUTANEOUS
  Filled 2020-12-21 (×2): qty 0.4

## 2020-12-21 MED ORDER — AZITHROMYCIN 250 MG PO TABS
500.0000 mg | ORAL_TABLET | Freq: Every day | ORAL | Status: AC
  Administered 2020-12-21: 500 mg via ORAL
  Filled 2020-12-21: qty 2

## 2020-12-21 MED ORDER — ALBUTEROL SULFATE HFA 108 (90 BASE) MCG/ACT IN AERS
2.0000 | INHALATION_SPRAY | Freq: Four times a day (QID) | RESPIRATORY_TRACT | Status: DC
  Administered 2020-12-21 – 2020-12-23 (×6): 2 via RESPIRATORY_TRACT

## 2020-12-21 MED ORDER — METHYLPREDNISOLONE SODIUM SUCC 40 MG IJ SOLR
40.0000 mg | Freq: Two times a day (BID) | INTRAMUSCULAR | Status: DC
  Administered 2020-12-21: 40 mg via INTRAVENOUS
  Filled 2020-12-21: qty 1

## 2020-12-21 MED ORDER — VITAMIN D 25 MCG (1000 UNIT) PO TABS
1000.0000 [IU] | ORAL_TABLET | Freq: Every day | ORAL | Status: DC
  Administered 2020-12-22 – 2020-12-23 (×2): 1000 [IU] via ORAL
  Filled 2020-12-21 (×2): qty 1

## 2020-12-21 MED ORDER — ONDANSETRON HCL 4 MG PO TABS: 4.0000 mg | ORAL_TABLET | Freq: Four times a day (QID) | ORAL | Status: DC | PRN

## 2020-12-21 MED ORDER — GUAIFENESIN-DM 100-10 MG/5ML PO SYRP: 10.0000 mL | ORAL_SOLUTION | ORAL | Status: DC | PRN

## 2020-12-21 MED ORDER — AZITHROMYCIN 250 MG PO TABS
250.0000 mg | ORAL_TABLET | Freq: Every day | ORAL | Status: DC
  Administered 2020-12-22 – 2020-12-23 (×2): 250 mg via ORAL
  Filled 2020-12-21 (×2): qty 1

## 2020-12-21 MED ORDER — PANTOPRAZOLE SODIUM 40 MG PO TBEC
40.0000 mg | DELAYED_RELEASE_TABLET | Freq: Every day | ORAL | Status: DC
  Administered 2020-12-22 – 2020-12-23 (×2): 40 mg via ORAL
  Filled 2020-12-21 (×2): qty 1

## 2020-12-21 NOTE — ED Provider Notes (Signed)
Emergency Medicine Provider Triage Evaluation Note  Ronald [name] , a 75 y.o. male  was evaluated in triage.  Pt complains of shortness of breath  Review of Systems  Positive: cough Negative: fever  Physical Exam  BP (!) 142/67 (BP Location: Right Arm)   Pulse 64   Temp 98.6 F (37 C) (Oral)   Resp 20   SpO2 (!) 87%  Gen:   Awake, no distress   Resp:  Normal effort  MSK:   Moves extremities without difficulty  Other:    Medical Decision Making  Medically screening exam initiated at 3:16 PM.  Appropriate orders placed.  Ronald [name] was informed that the remainder of the evaluation will be completed by another provider, this initial triage assessment does not replace that evaluation, and the importance of remaining in the ED until their evaluation is complete.     Ronald [name] 12/21/20 1518    Truddie Hidden, MD 12/21/20 [phone]

## 2020-12-21 NOTE — ED Triage Notes (Signed)
Short of breath for 1 week

## 2020-12-21 NOTE — ED Triage Notes (Signed)
Patient is a referral from urgent care in [place], advised to come here for evaluation

## 2020-12-21 NOTE — ED Provider Notes (Signed)
[facility] Provider Note   CSN: [id number] Arrival date & time: 12/21/20  1413     History Chief Complaint  Patient presents with   Shortness of Breath    Ronald [name] is a 75 y.o. male.  HPI  75 year old male with a history of anxiety, asthma, COPD, CAD, depression, GERD, hyperlipidemia, hypertension, NSTEMI, CVA, TIA, diabetes, ulcerative colitis, who presents to the emergency department today for evaluation of shortness of breath.  Shortness of breath edema ongoing for 4 years but states got worse in last 24 hours.  He denies any pleuritic pain, chest pain.  Reports swelling to bilateral arms and legs as been present for many years.  He has had a bit of a cough that started today.  Denies any fevers or chills at home.  Denies any other symptoms at this time.  Has not had his COVID vaccines.  Past Medical History:  Diagnosis Date   Anxiety    Asthma    Chronic lower back pain    COPD (chronic obstructive pulmonary disease) (HCC)    Coronary artery disease    a. NSTEMI 05/2014 s/p DESx2 to LAD at [facility].   Depression    Educated about COVID-19 virus infection 03/06/2020   GERD (gastroesophageal reflux disease)    High cholesterol    Hypertension    NSTEMI (non-ST elevated myocardial infarction) ([place]) 05/2014   with stent placement   Sleep apnea    Stroke ([place]) 2017   anyeusym    TIA (transient ischemic attack)    "they say I've had some mini strokes; don't know when"; denies residual on 06/22/2014)   Type II diabetes mellitus ([place])    Ulcerative colitis [facility])     Patient Active Problem List   Diagnosis Date Noted   Cellulitis 09/25/2020   Chest pain 05/23/2020   Controlled substance agreement signed 04/03/2020   Benzodiazepine dependence ([place]) 04/03/2020   Dysphagia 02/27/2020   Diarrhea 02/27/2020   Tracheal stenosis 12/09/2019   Noncompliance 04/01/2019   Lymphedema 04/01/2019   CHF (congestive heart failure) ([place]) 08/27/2018   Aortic  atherosclerosis ([place]) 05/11/2017   Depression 05/05/2017   GERD (gastroesophageal reflux disease) 01/16/2017   Diabetic neuropathy ([place]) 01/16/2017   Anxiety 02/14/2016   Chronic respiratory failure with hypoxia ([place]) 07/19/2014   Hypertension associated with diabetes ([place]) 07/19/2014   Cigarette smoker 07/19/2014   Chest pain at rest 06/22/2014   CAD in native artery 06/22/2014   Diabetes mellitus ([place]) 06/22/2014   COPD GOLD 3 / still smoking 06/22/2014   Tobacco abuse 06/22/2014   Acute encephalopathy 06/22/2014   Leucocytosis 06/22/2014    Past Surgical History:  Procedure Laterality Date   APPENDECTOMY     BIOPSY  07/20/2020   Procedure: BIOPSY;  Surgeon: Harvel Quale, MD;  [facility];  Service: Gastroenterology;;   CARDIAC CATHETERIZATION  [phone] X 3   CHOLECYSTECTOMY     COLONOSCOPY WITH PROPOFOL N/A 07/20/2020   Procedure: COLONOSCOPY WITH PROPOFOL;  Surgeon: Harvel Quale, MD;  Location[facility];  Service: Gastroenterology;  Laterality: N/A;  1:15   CORONARY ANGIOPLASTY WITH STENT PLACEMENT  05/2014   "2"   ESOPHAGEAL DILATION N/A 07/20/2020   Procedure: ESOPHAGEAL DILATION;  Surgeon: Harvel Quale, MD;  [facility];  Service: Gastroenterology;  Laterality: N/A;   ESOPHAGOGASTRODUODENOSCOPY (EGD) WITH PROPOFOL N/A 07/20/2020   Procedure: ESOPHAGOGASTRODUODENOSCOPY (EGD) WITH PROPOFOL;  Surgeon: Harvel Quale, MD;  [facility];  Service: Gastroenterology;  Laterality: N/A;   LEFT HEART CATH AND CORONARY ANGIOGRAPHY N/A 05/25/2020   Procedure: LEFT HEART CATH AND CORONARY ANGIOGRAPHY;  Surgeon: Martinique, Peter M, MD;  Location: [place] [facility];  Service: Cardiovascular;  Laterality: N/A;   POLYPECTOMY  07/20/2020   Procedure: POLYPECTOMY INTESTINAL;  Surgeon: Harvel Quale, MD;  Location: [facility];  Service: Gastroenterology;;   TUMOR EXCISION Right ~ 1999   "side of my  upper head"       Family History  Problem Relation Age of Onset   CAD Father    Lung cancer Brother        smoked   Cancer Brother        lung   Leukemia Sister    Dementia Sister    Stroke Mother    Emphysema Sister    Cancer Brother        lung    Social History   Tobacco Use   Smoking status: Every Day    Packs/day: 1.50    Years: 48.00    Pack years: 72.00    Types: Cigarettes    Start date: 02/12/1966   Smokeless tobacco: Never   Tobacco comments:    smokes 5-6 cigarettes per day 06/07/2020  Vaping Use   Vaping Use: Never used  Substance Use Topics   Alcohol use: Not Currently    Alcohol/week: 0.0 standard drinks   Drug use: No    Home Medications Prior to Admission medications   Medication Sig Start Date End Date Taking? Authorizing Provider  albuterol (VENTOLIN HFA) 108 (90 Base) MCG/ACT inhaler INHALE 2 PUFFS EVERY 6 HOURS AS NEEEDED Patient taking differently: Inhale 2 puffs into the lungs every 6 (six) hours as needed for wheezing or shortness of breath. 11/02/19  Yes Hawks, Christy A, FNP  ALPRAZolam (XANAX) 0.5 MG tablet Take 1 tablet (0.5 mg total) by mouth 2 (two) times daily as needed. for anxiety 09/12/20  Yes Hawks, Christy A, FNP  amLODipine (NORVASC) 5 MG tablet Take 1 tablet (5 mg total) by mouth daily. 03/09/20  Yes Hawks, Christy A, FNP  Ascorbic Acid (VITAMIN C) 1000 MG tablet Take 1,000 mg by mouth daily.   Yes [provider]  aspirin EC 81 MG tablet Take 1 tablet (81 mg total) by mouth daily. 10/15/18  Yes Herminio Commons, MD  atorvastatin (LIPITOR) 40 MG tablet TAKE 1 TABLET BY MOUTH ONCE DAILY IN THE EVENING 12/05/20  Yes Hawks, Christy A, FNP  Budeson-Glycopyrrol-Formoterol (BREZTRI AEROSPHERE) 160-9-4.8 MCG/ACT AERO Inhale 2 puffs into the lungs in the morning and at bedtime. 09/05/20  Yes Hawks, Christy A, FNP  cholecalciferol (VITAMIN D3) 25 MCG (1000 UNIT) tablet Take 1,000 Units by mouth daily.   Yes [provider]   escitalopram (LEXAPRO) 20 MG tablet Take 1 tablet by mouth once daily 11/13/20  Yes Hawks, Christy A, FNP  fluticasone (FLONASE) 50 MCG/ACT nasal spray Place 2 sprays into both nostrils daily. 11/15/20  Yes Gwenlyn Perking, FNP  Fluticasone-Umeclidin-Vilant (TRELEGY ELLIPTA IN) Inhale 1 puff into the lungs daily.   Yes [provider]  furosemide (LASIX) 20 MG tablet Take 1 tablet (20 mg total) by mouth 2 (two) times daily. 10/31/20  Yes Hawks, Christy A, FNP  guaiFENesin (REFENESEN 400 PO) Take 2 tablets by mouth daily as needed (mucas).   Yes [provider]  ipratropium-albuterol (DUONEB) 0.5-2.5 (3) MG/3ML SOLN USE ONE VIAL BY NEBULIZER FOUR TIMES DAILY Patient taking differently: Inhale  3 mLs into the lungs every 6 (six) hours as needed (asthma). 03/06/20  Yes Hawks, Christy A, FNP  losartan (COZAAR) 100 MG tablet Take 100 mg by mouth daily.   Yes [provider]  metoprolol tartrate (LOPRESSOR) 50 MG tablet Take 1 tablet (50 mg total) by mouth 2 (two) times daily. 03/09/20  Yes Hawks, Christy A, FNP  nicotine (NICODERM CQ - DOSED IN MG/24 HOURS) 14 mg/24hr patch Place 1 patch (14 mg total) onto the skin daily. 06/14/20  Yes Hawks, Christy A, FNP  nitroGLYCERIN (NITROSTAT) 0.4 MG SL tablet DISSOLVE TAKE 1 TABLET UNDER THE TONGUE EVERY FIVE MINUTES AS NEEDED FOR CHEST PAIN Patient taking differently: Place 0.4 mg under the tongue every 5 (five) minutes as needed for chest pain. 05/16/20  Yes Hawks, Christy A, FNP  OXYGEN Inhale 2 L into the lungs daily. 24 hours a day   Yes [provider]  Blood Glucose Monitoring Suppl (ACCU-CHEK GUIDE ME) w/Device KIT Check BS daily Dx E11.9 05/16/20   [name] A, FNP  Fluticasone-Umeclidin-Vilant (TRELEGY ELLIPTA) 100-62.5-25 MCG/INH AEPB Inhale into the lungs. Patient not taking: Reported on 12/21/2020    [provider]  glucose blood (ONE TOUCH ULTRA TEST) test strip USE TO CHECK GLUCOSE ONCE DAILY 02/11/18   Terald Sleeper, PA-C  isosorbide mononitrate (IMDUR) 30 MG 24 hr tablet Take 1 tablet (30 mg total) by mouth daily. Patient not taking: Reported on 12/21/2020 12/19/20   Verta Ellen., NP  Lancets [facility] ULTRASOFT) lancets Use as instructed   DX E11.9 05/05/17   [name], FNP  mirtazapine (REMERON) 15 MG tablet TAKE 1 TABLET BY MOUTH AT BEDTIME Patient not taking: Reported on 12/21/2020 07/11/20   [name], FNP  pantoprazole (PROTONIX) 40 MG tablet Take 1 tablet by mouth once daily 11/26/20   [name] A, FNP    Allergies    Gabapentin and Metformin and related  Review of Systems   Review of Systems  Constitutional:  Negative for chills and fever.  HENT:  Negative for ear pain and sore throat.   Eyes:  Negative for pain and visual disturbance.  Respiratory:  Positive for cough and shortness of breath.   Cardiovascular:  Positive for leg swelling. Negative for palpitations.  Gastrointestinal:  Negative for abdominal pain, constipation, diarrhea, nausea and vomiting.  Genitourinary:  Negative for dysuria and hematuria.  Musculoskeletal:  Negative for back pain.  Skin:  Negative for rash.  Neurological:  Negative for seizures and syncope.  All other systems reviewed and are negative.  Physical Exam Updated Vital Signs BP (!) 186/73   Pulse (!) 59   Temp 98.6 F (37 C) (Oral)   Resp (!) 21   SpO2 93%   Physical Exam Vitals and nursing note reviewed.  Constitutional:      Appearance: He is well-developed.  HENT:     Head: Normocephalic and atraumatic.  Eyes:     Conjunctiva/sclera: Conjunctivae normal.  Cardiovascular:     Rate and Rhythm: Normal rate and regular rhythm.     Heart sounds: No murmur heard. Pulmonary:     Effort: Pulmonary effort is normal. No respiratory distress.     Breath sounds: Decreased breath sounds and wheezing (rare) present. No rhonchi.  Abdominal:     Palpations: Abdomen is soft.     Tenderness: There is no abdominal  tenderness.  Musculoskeletal:     Cervical back: Neck supple.  Skin:    General:  Skin is warm and dry.  Neurological:     Mental Status: He is alert.    ED Results / Procedures / Treatments   Labs (all labs ordered are listed, but only abnormal results are displayed) Labs Reviewed  RESP PANEL BY RT-PCR (FLU A&B, COVID) ARPGX2 - Abnormal; Notable for the following components:      Result Value   SARS Coronavirus 2 by RT PCR POSITIVE (*)    All other components within normal limits  BASIC METABOLIC PANEL - Abnormal; Notable for the following components:   Potassium 3.3 (*)    Chloride 97 (*)    Glucose, Bld 215 (*)    Calcium 8.6 (*)    All other components within normal limits  CBC - Abnormal; Notable for the following components:   WBC 11.6 (*)    All other components within normal limits  BRAIN NATRIURETIC PEPTIDE - Abnormal; Notable for the following components:   B Natriuretic Peptide 364.0 (*)    All other components within normal limits  TROPONIN I (HIGH SENSITIVITY) - Abnormal; Notable for the following components:   Troponin I (High Sensitivity) 304 (*)    All other components within normal limits  TROPONIN I (HIGH SENSITIVITY) - Abnormal; Notable for the following components:   Troponin I (High Sensitivity) 287 (*)    All other components within normal limits  HEPATIC FUNCTION PANEL    EKG EKG Interpretation  Date/Time:  Friday December 21 2020 14:40:48 EDT Ventricular Rate:  63 PR Interval:  160 QRS Duration: 96 QT Interval:  458 QTC Calculation: 468 R Axis:   67 Text Interpretation: Normal sinus rhythm Left ventricular hypertrophy with repolarization abnormality ( Sokolow-Lyon , Romhilt-Estes ) Abnormal ECG No significant change since last tracing Confirmed by Calvert Cantor [id number]) on 12/21/2020 4:02:18 PM  Radiology DG Chest 2 View  Result Date: 12/21/2020 CLINICAL DATA:  Chest pain and short of breath EXAM: CHEST - 2 VIEW COMPARISON:  09/25/2020  FINDINGS: COPD with pulmonary hyperinflation. Heart size upper normal. Lungs are well aerated and clear without infiltrate effusion or edema. Atherosclerotic calcification aortic arch. IMPRESSION: COPD.  No superimposed acute abnormality. Electronically Signed   By: Franchot Gallo M.D.   On: 12/21/2020 15:11   CT Angio Chest PE W and/or Wo Contrast  Result Date: 12/21/2020 CLINICAL DATA:  Shortness of breath over the past week. EXAM: CT ANGIOGRAPHY CHEST WITH CONTRAST TECHNIQUE: Multidetector CT imaging of the chest was performed using the standard protocol during bolus administration of intravenous contrast. Multiplanar CT image reconstructions and MIPs were obtained to evaluate the vascular anatomy. CONTRAST:  158m OMNIPAQUE IOHEXOL 350 MG/ML SOLN COMPARISON:  CT chest dated August 27, 2018. FINDINGS: Cardiovascular: Satisfactory opacification of the pulmonary arteries to the segmental level. No evidence of pulmonary embolism. Mild cardiomegaly. No pericardial effusion. No thoracic aortic aneurysm or dissection. Coronary, aortic arch, and branch vessel atherosclerotic vascular disease. Mediastinum/Nodes: No enlarged mediastinal, hilar, or axillary lymph nodes. Thyroid gland, trachea, and esophagus demonstrate no significant findings. Lungs/Pleura: Moderate centrilobular and paraseptal emphysema. Mild scarring at the left lung base. No focal consolidation, pleural effusion, or pneumothorax. Upper Abdomen: No acute abnormality. Unchanged bilateral adrenal adenomas. Musculoskeletal: No chest wall abnormality. No acute or significant osseous findings. Old healed sternal fracture. Review of the MIP images confirms the above findings. IMPRESSION: 1. No evidence of pulmonary embolism. No acute intrathoracic process. 2. Aortic Atherosclerosis (ICD10-I70.0) and Emphysema (ICD10-J43.9). Electronically Signed   By: WTitus DubinM.D.   On: 12/21/2020  18:26    Procedures Procedures   7:46 PM Cardiac monitoring  reveals NSR, HR 50s (Rate & rhythm), as reviewed and interpreted by me. Cardiac monitoring was ordered due to sob and to monitor patient for dysrhythmia.   Medications Ordered in ED Medications  iohexol (OMNIPAQUE) 350 MG/ML injection 100 mL (100 mLs Intravenous Contrast Given 12/21/20 1737)  aspirin tablet 325 mg (325 mg Oral Given 12/21/20 1713)  albuterol (VENTOLIN HFA) 108 (90 Base) MCG/ACT inhaler 2 puff (2 puffs Inhalation Given 12/21/20 1908)  potassium chloride SA (KLOR-CON) CR tablet 40 mEq (40 mEq Oral Given 12/21/20 1908)    ED Course  I have reviewed the triage vital signs and the nursing notes.  Pertinent labs & imaging results that were available during my care of the patient were reviewed by me and considered in my medical decision making (see chart for details).    MDM Rules/Calculators/A&P                          75 y/o male presents for eval of sob, hx copd  Reviewed/interpreted labs Cbc with mild leukocytosis BMP with mild hypokalemia, hyperglycemia, otherwise reassuring Trop elevated, but flat BNP elevated COVID positive  EKG - Normal sinus rhythm Left ventricular hypertrophy with repolarization abnormality ( Sokolow-Lyon , Romhilt-Estes ) Abnormal ECG No significant change since last tracing  Reviewed/interpreted imaging CXR- COPD.  No superimposed acute abnormality.  CTA chest -  1. No evidence of pulmonary embolism. No acute intrathoracic process. 2. Aortic Atherosclerosis (ICD10-I70.0) and Emphysema  Pt with sob and covid. Only has mild wheezing on exam so less likely copd exacerbation. Bnp elevated but no edema on cxr or cta to seems less likely chf exac. Suspect sxs mainly due to covid. Albuterol was given in the ed. Will consult cardiology regarding elevated trops and plan for admission   7:23 PM CONSULT with Dr. Domenic Polite with cardiology who does not feel that heparin needs to be started at that time. Recommends admission for serial troponins and an echo  to r/o myopathy  7:44 PM CONSULT with Dr. Josephine Cables who accepts patient for admission. He recommends remdesivir   Final Clinical Impression(s) / ED Diagnoses Final diagnoses:  COVID  Elevated troponin    Rx / DC Orders ED Discharge Orders     None        Bishop Dublin 12/21/20 1946    Truddie Hidden, MD 12/22/20 1454
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
## 2020-12-21 NOTE — ED Notes (Signed)
Pt states that he wears oxygen at night, unsure of how many liters,

## 2020-12-21 NOTE — ED Notes (Signed)
Pt resting comfortably, HOB raised per request. Pt requesting food

## 2020-12-21 NOTE — ED Notes (Addendum)
Date and time results received: 12/21/20 6:06 PM  Test: Covid Critical Value: Positive  Name of Provider Notified: Cortni PA  Orders Received? Or Actions Taken? See orders

## 2020-12-22 ENCOUNTER — Observation Stay (HOSPITAL_COMMUNITY): Payer: Medicare HMO

## 2020-12-22 ENCOUNTER — Other Ambulatory Visit (HOSPITAL_COMMUNITY): Payer: Self-pay | Admitting: Family

## 2020-12-22 DIAGNOSIS — J9601 Acute respiratory failure with hypoxia: Secondary | ICD-10-CM | POA: Diagnosis present

## 2020-12-22 DIAGNOSIS — F32A Depression, unspecified: Secondary | ICD-10-CM | POA: Diagnosis present

## 2020-12-22 DIAGNOSIS — I252 Old myocardial infarction: Secondary | ICD-10-CM | POA: Diagnosis not present

## 2020-12-22 DIAGNOSIS — E876 Hypokalemia: Secondary | ICD-10-CM | POA: Diagnosis not present

## 2020-12-22 DIAGNOSIS — J440 Chronic obstructive pulmonary disease with acute lower respiratory infection: Secondary | ICD-10-CM | POA: Diagnosis not present

## 2020-12-22 DIAGNOSIS — I890 Lymphedema, not elsewhere classified: Secondary | ICD-10-CM | POA: Diagnosis not present

## 2020-12-22 DIAGNOSIS — J9621 Acute and chronic respiratory failure with hypoxia: Secondary | ICD-10-CM | POA: Diagnosis not present

## 2020-12-22 DIAGNOSIS — R7989 Other specified abnormal findings of blood chemistry: Secondary | ICD-10-CM | POA: Diagnosis present

## 2020-12-22 DIAGNOSIS — J441 Chronic obstructive pulmonary disease with (acute) exacerbation: Secondary | ICD-10-CM | POA: Diagnosis not present

## 2020-12-22 DIAGNOSIS — R778 Other specified abnormalities of plasma proteins: Secondary | ICD-10-CM | POA: Diagnosis not present

## 2020-12-22 DIAGNOSIS — F419 Anxiety disorder, unspecified: Secondary | ICD-10-CM | POA: Diagnosis present

## 2020-12-22 DIAGNOSIS — Z9981 Dependence on supplemental oxygen: Secondary | ICD-10-CM | POA: Diagnosis not present

## 2020-12-22 DIAGNOSIS — E1140 Type 2 diabetes mellitus with diabetic neuropathy, unspecified: Secondary | ICD-10-CM | POA: Diagnosis not present

## 2020-12-22 DIAGNOSIS — J1282 Pneumonia due to coronavirus disease 2019: Secondary | ICD-10-CM | POA: Diagnosis not present

## 2020-12-22 DIAGNOSIS — R69 Illness, unspecified: Secondary | ICD-10-CM | POA: Diagnosis not present

## 2020-12-22 DIAGNOSIS — I700 Atherosclerosis of aorta: Secondary | ICD-10-CM | POA: Diagnosis present

## 2020-12-22 DIAGNOSIS — I160 Hypertensive urgency: Secondary | ICD-10-CM | POA: Diagnosis present

## 2020-12-22 DIAGNOSIS — J398 Other specified diseases of upper respiratory tract: Secondary | ICD-10-CM | POA: Diagnosis not present

## 2020-12-22 DIAGNOSIS — E7800 Pure hypercholesterolemia, unspecified: Secondary | ICD-10-CM | POA: Diagnosis present

## 2020-12-22 DIAGNOSIS — Z720 Tobacco use: Secondary | ICD-10-CM | POA: Diagnosis not present

## 2020-12-22 DIAGNOSIS — I248 Other forms of acute ischemic heart disease: Secondary | ICD-10-CM | POA: Diagnosis present

## 2020-12-22 DIAGNOSIS — I2510 Atherosclerotic heart disease of native coronary artery without angina pectoris: Secondary | ICD-10-CM | POA: Diagnosis present

## 2020-12-22 DIAGNOSIS — Z28310 Unvaccinated for covid-19: Secondary | ICD-10-CM | POA: Diagnosis not present

## 2020-12-22 DIAGNOSIS — I10 Essential (primary) hypertension: Secondary | ICD-10-CM | POA: Diagnosis not present

## 2020-12-22 DIAGNOSIS — E1165 Type 2 diabetes mellitus with hyperglycemia: Secondary | ICD-10-CM | POA: Diagnosis not present

## 2020-12-22 DIAGNOSIS — U071 COVID-19: Secondary | ICD-10-CM | POA: Diagnosis not present

## 2020-12-22 DIAGNOSIS — R001 Bradycardia, unspecified: Secondary | ICD-10-CM | POA: Diagnosis not present

## 2020-12-22 LAB — GLUCOSE, CAPILLARY
Glucose-Capillary: 218 mg/dL — ABNORMAL HIGH (ref 70–99)
Glucose-Capillary: 216 mg/dL — ABNORMAL HIGH (ref 70–99)
Glucose-Capillary: 226 mg/dL — ABNORMAL HIGH (ref 70–99)
Glucose-Capillary: 250 mg/dL — ABNORMAL HIGH (ref 70–99)

## 2020-12-22 LAB — CBC WITH DIFFERENTIAL/PLATELET
Abs Immature Granulocytes: 0.04 10*3/uL (ref 0.00–0.07)
Basophils Absolute: 0 10*3/uL (ref 0.0–0.1)
Basophils Relative: 0 %
Eosinophils Absolute: 0 10*3/uL (ref 0.0–0.5)
Eosinophils Relative: 0 %
HCT: 41.9 % (ref 39.0–52.0)
Hemoglobin: 14 g/dL (ref 13.0–17.0)
Immature Granulocytes: 1 %
Lymphocytes Relative: 3 %
Lymphs Abs: 0.3 10*3/uL — ABNORMAL LOW (ref 0.7–4.0)
MCH: 29.5 pg (ref 26.0–34.0)
MCHC: 33.4 g/dL (ref 30.0–36.0)
MCV: 88.2 fL (ref 80.0–100.0)
Monocytes Absolute: 0.3 10*3/uL (ref 0.1–1.0)
Monocytes Relative: 4 %
Neutro Abs: 7.3 10*3/uL (ref 1.7–7.7)
Neutrophils Relative %: 92 %
Platelets: 280 10*3/uL (ref 150–400)
RBC: 4.75 MIL/uL (ref 4.22–5.81)
RDW: 13.3 % (ref 11.5–15.5)
WBC: 8 10*3/uL (ref 4.0–10.5)
nRBC: 0 % (ref 0.0–0.2)

## 2020-12-22 LAB — C-REACTIVE PROTEIN: CRP: 0.8 mg/dL (ref <1.0)

## 2020-12-22 LAB — COMPREHENSIVE METABOLIC PANEL
ALT: 19 U/L (ref 0–44)
AST: 22 U/L (ref 15–41)
Albumin: 3.3 g/dL — ABNORMAL LOW (ref 3.5–5.0)
Alkaline Phosphatase: 84 U/L (ref 38–126)
Anion gap: 8 (ref 5–15)
BUN: 16 mg/dL (ref 8–23)
CO2: 29 mmol/L (ref 22–32)
Calcium: 8.3 mg/dL — ABNORMAL LOW (ref 8.9–10.3)
Chloride: 97 mmol/L — ABNORMAL LOW (ref 98–111)
Creatinine, Ser: 1.12 mg/dL (ref 0.61–1.24)
GFR, Estimated: >60 mL/min (ref >60)
Glucose, Bld: 257 mg/dL — ABNORMAL HIGH (ref 70–99)
Potassium: 3.5 mmol/L (ref 3.5–5.1)
Sodium: 134 mmol/L — ABNORMAL LOW (ref 135–145)
Total Bilirubin: 0.5 mg/dL (ref 0.3–1.2)
Total Protein: 6.5 g/dL (ref 6.5–8.1)

## 2020-12-22 LAB — FERRITIN: Ferritin: 21 ng/mL — ABNORMAL LOW (ref 24–336)

## 2020-12-22 LAB — HEMOGLOBIN A1C
Hgb A1c MFr Bld: 7.9 % — ABNORMAL HIGH (ref 4.8–5.6)
Mean Plasma Glucose: 180.03 mg/dL

## 2020-12-22 LAB — D-DIMER, QUANTITATIVE: D-Dimer, Quant: 0.42 ug/mL-FEU (ref 0.00–0.50)

## 2020-12-22 LAB — PHOSPHORUS: Phosphorus: 1.5 mg/dL — ABNORMAL LOW (ref 2.5–4.6)

## 2020-12-22 LAB — MAGNESIUM: Magnesium: 1.3 mg/dL — ABNORMAL LOW (ref 1.7–2.4)

## 2020-12-22 MED ORDER — METHYLPREDNISOLONE SODIUM SUCC 125 MG IJ SOLR
80.0000 mg | Freq: Two times a day (BID) | INTRAMUSCULAR | Status: AC
  Administered 2020-12-22 – 2020-12-23 (×3): 80 mg via INTRAVENOUS
  Filled 2020-12-22 (×3): qty 2

## 2020-12-22 MED ORDER — INSULIN ASPART 100 UNIT/ML IJ SOLN
0.0000 [IU] | Freq: Every day | INTRAMUSCULAR | Status: DC
  Administered 2020-12-22: 2 [IU] via SUBCUTANEOUS

## 2020-12-22 MED ORDER — INSULIN ASPART 100 UNIT/ML IJ SOLN
0.0000 [IU] | Freq: Three times a day (TID) | INTRAMUSCULAR | Status: DC
  Administered 2020-12-22 – 2020-12-23 (×5): 5 [IU] via SUBCUTANEOUS

## 2020-12-22 MED ORDER — METOPROLOL TARTRATE 25 MG PO TABS
25.0000 mg | ORAL_TABLET | Freq: Two times a day (BID) | ORAL | Status: DC
  Administered 2020-12-22 – 2020-12-23 (×3): 25 mg via ORAL
  Filled 2020-12-22 (×3): qty 1

## 2020-12-22 MED ORDER — MAGNESIUM SULFATE 2 GM/50ML IV SOLN
2.0000 g | Freq: Once | INTRAVENOUS | Status: AC
  Administered 2020-12-22: 2 g via INTRAVENOUS
  Filled 2020-12-22: qty 50

## 2020-12-22 MED ORDER — K PHOS MONO-SOD PHOS DI & MONO 155-852-130 MG PO TABS
500.0000 mg | ORAL_TABLET | Freq: Two times a day (BID) | ORAL | Status: DC
  Administered 2020-12-22 (×2): 500 mg via ORAL
  Filled 2020-12-22 (×2): qty 2
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
## 2020-12-23 ENCOUNTER — Inpatient Hospital Stay (HOSPITAL_COMMUNITY): Payer: Medicare HMO

## 2020-12-23 ENCOUNTER — Other Ambulatory Visit (HOSPITAL_COMMUNITY): Payer: Self-pay | Admitting: Family

## 2020-12-23 DIAGNOSIS — J9621 Acute and chronic respiratory failure with hypoxia: Secondary | ICD-10-CM | POA: Diagnosis not present

## 2020-12-23 DIAGNOSIS — R778 Other specified abnormalities of plasma proteins: Secondary | ICD-10-CM | POA: Diagnosis not present

## 2020-12-23 DIAGNOSIS — J9601 Acute respiratory failure with hypoxia: Secondary | ICD-10-CM

## 2020-12-23 DIAGNOSIS — U071 COVID-19: Secondary | ICD-10-CM | POA: Diagnosis not present

## 2020-12-23 LAB — COMPREHENSIVE METABOLIC PANEL
ALT: 20 U/L (ref 0–44)
AST: 21 U/L (ref 15–41)
Albumin: 3.4 g/dL — ABNORMAL LOW (ref 3.5–5.0)
Alkaline Phosphatase: 89 U/L (ref 38–126)
Anion gap: 7 (ref 5–15)
BUN: 22 mg/dL (ref 8–23)
CO2: 33 mmol/L — ABNORMAL HIGH (ref 22–32)
Calcium: 8.5 mg/dL — ABNORMAL LOW (ref 8.9–10.3)
Chloride: 96 mmol/L — ABNORMAL LOW (ref 98–111)
Creatinine, Ser: 0.9 mg/dL (ref 0.61–1.24)
GFR, Estimated: >60 mL/min (ref >60)
Glucose, Bld: 224 mg/dL — ABNORMAL HIGH (ref 70–99)
Potassium: 3.5 mmol/L (ref 3.5–5.1)
Sodium: 136 mmol/L (ref 135–145)
Total Bilirubin: 0.5 mg/dL (ref 0.3–1.2)
Total Protein: 7 g/dL (ref 6.5–8.1)

## 2020-12-23 LAB — FERRITIN: Ferritin: 33 ng/mL (ref 24–336)

## 2020-12-23 LAB — CBC WITH DIFFERENTIAL/PLATELET
Abs Immature Granulocytes: 0.04 10*3/uL (ref 0.00–0.07)
Basophils Absolute: 0 10*3/uL (ref 0.0–0.1)
Basophils Relative: 0 %
Eosinophils Absolute: 0 10*3/uL (ref 0.0–0.5)
Eosinophils Relative: 0 %
HCT: 46.7 % (ref 39.0–52.0)
Hemoglobin: 15.5 g/dL (ref 13.0–17.0)
Immature Granulocytes: 1 %
Lymphocytes Relative: 8 %
Lymphs Abs: 0.6 10*3/uL — ABNORMAL LOW (ref 0.7–4.0)
MCH: 29.4 pg (ref 26.0–34.0)
MCHC: 33.2 g/dL (ref 30.0–36.0)
MCV: 88.4 fL (ref 80.0–100.0)
Monocytes Absolute: 0.2 10*3/uL (ref 0.1–1.0)
Monocytes Relative: 3 %
Neutro Abs: 6 10*3/uL (ref 1.7–7.7)
Neutrophils Relative %: 88 %
Platelets: 300 10*3/uL (ref 150–400)
RBC: 5.28 MIL/uL (ref 4.22–5.81)
RDW: 13.4 % (ref 11.5–15.5)
WBC: 6.8 10*3/uL (ref 4.0–10.5)
nRBC: 0 % (ref 0.0–0.2)

## 2020-12-23 LAB — PHOSPHORUS: Phosphorus: 3.9 mg/dL (ref 2.5–4.6)

## 2020-12-23 LAB — MAGNESIUM: Magnesium: 2.2 mg/dL (ref 1.7–2.4)

## 2020-12-23 LAB — GLUCOSE, CAPILLARY: Glucose-Capillary: 222 mg/dL — ABNORMAL HIGH (ref 70–99)

## 2020-12-23 LAB — C-REACTIVE PROTEIN: CRP: 1 mg/dL — ABNORMAL HIGH (ref <1.0)

## 2020-12-23 LAB — D-DIMER, QUANTITATIVE: D-Dimer, Quant: 0.39 ug/mL-FEU (ref 0.00–0.50)

## 2020-12-23 MED ORDER — AZITHROMYCIN 250 MG PO TABS: 250.0000 mg | ORAL_TABLET | Freq: Every day | ORAL | 0 refills | Status: DC

## 2020-12-23 MED ORDER — PREDNISONE 50 MG PO TABS: 50.0000 mg | ORAL_TABLET | Freq: Every day | ORAL | 0 refills | Status: DC

## 2020-12-23 MED ORDER — PREDNISONE 20 MG PO TABS
50.0000 mg | ORAL_TABLET | Freq: Every day | ORAL | Status: DC
  Filled 2020-12-23: qty 1
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
## 2020-12-23 NOTE — TOC Transition Note (Addendum)
Transition of Care [facility]) - CM/SW Discharge Note   Patient Details  Name: Ronald [name] MRN: [id number] Date of Birth: 01-22-1946  Transition of [facility]) CM/SW Contact:  Natasha Bence, LCSW Phone Number: 12/23/2020, 12:45 PM   Clinical Narrative:    CSW notified of patient's readiness for discharge. Patient now requiring continuous O2. Patient's spouse agreeable to bring portable oxygen to patient for discharge. CSW notified Ashly with [employer] of patient's change in O2. Ashly agreeable to provide increased O2 to patient. TOC signing off.    Final next level of care: Home/Self Care Barriers to Discharge: Barriers Resolved   Patient Goals and CMS Choice Patient states their goals for this hospitalization and ongoing recovery are:: Return home CMS Medicare.gov Compare Post Acute Care list provided to:: Patient Choice offered to / list presented to : Patient  Discharge Placement                    Patient and family notified of of transfer: 12/23/20  Discharge Plan and Services                DME Arranged: Oxygen DME Agency: [employer] [facility] Date DME Agency Contacted: 12/23/20 Time DME Agency Contacted: [phone] Representative spoke with at DME Agency: Ashly            Social Determinants of Health ([place]) Interventions     Readmission Risk Interventions Readmission Risk Prevention Plan 09/28/2020  Transportation Screening Complete  HRI or [place] Complete  Social Work Consult for [place] Planning/Counseling Complete  Palliative Care Screening Not Applicable  Medication Review Press photographer) Complete  Some recent data might be hidden

## 2020-12-23 NOTE — Progress Notes (Signed)
MD Tat notified that cardiovascular tech here to do ordered echo, pt in wheelchair and ready for discharged. Per MD Tat, pt can discharge without echo. Cardiovascular tech notified and states understanding.

## 2020-12-23 NOTE — Progress Notes (Signed)
Pt states MD told him that he was going home today. Advised pt that whomever was picking him up would need to bring his home O2. Pt states he only wears it at night. O2 cannula removed and within 5 minutes pt's SaO2 dropped to 84-85% on room air. O2 cannula replaced (3lpm) and SaO2 back up to 94%. Advised pt that he would need to wear O2 continuously for a while due to covid infection. Pt stated understanding, states will call is ride to let them know to bring his oxygen.

## 2020-12-23 NOTE — Progress Notes (Signed)
SATURATION QUALIFICATIONS: (This note is used to comply with regulatory documentation for home oxygen)   Patient Saturations on Room Air at Rest = 84   Patient Saturations on Room Air while Ambulating = N/A   Patient Saturations on 3 Liters of oxygen while Ambulating = 95   Please briefly explain why patient needs home oxygen: To maintain 02 sat at 90% or above during ambulation.  Ronald Eva, DO

## 2020-12-23 NOTE — Progress Notes (Signed)
  Echocardiogram 2D Echocardiogram has been attempted. Patient being discharged.  Ronald [name] Ronald [name] 12/23/2020, 1:19 PM

## 2020-12-23 NOTE — Progress Notes (Signed)
Pt discharged via WC to POV with all personal belongings in his possession. 

## 2020-12-24 ENCOUNTER — Telehealth: Payer: Self-pay

## 2020-12-24 ENCOUNTER — Other Ambulatory Visit: Payer: Self-pay

## 2020-12-24 DIAGNOSIS — J441 Chronic obstructive pulmonary disease with (acute) exacerbation: Secondary | ICD-10-CM

## 2020-12-24 NOTE — Telephone Encounter (Signed)
Transition Care Management Unsuccessful Follow-up Telephone Call  Date of discharge and from where:  [facility] 12/23/20  Diagnosis: respiratory failure due to covid pneumonia   Attempts:  1st Attempt  Reason for unsuccessful TCM follow-up call:  Left voice message  Transition Care Management Unsuccessful Follow-up Telephone Call  Date of discharge and from where:  [facility] 12/23/20  Diagnosis: respiratory failure due to covid pneumonia and CHF exacerbation   Attempts:  2nd Attempt  Reason for unsuccessful TCM follow-up call:  Unable to leave message v/m not set up

## 2020-12-26 LAB — GLUCOSE, CAPILLARY: Glucose-Capillary: 211 mg/dL — ABNORMAL HIGH (ref 70–99)

## 2020-12-27 NOTE — Telephone Encounter (Signed)
Transition Care Management Follow-up Telephone Call Date of discharge and from where: [facility] 12/23/20 Diagnosis: Chronic respiratory failure with hypoxia secondary to covid pneumonia and COPD exacerbation How have you been since you were released from the hospital? Better, stable Any questions or concerns? No  Items Reviewed: Did the pt receive and understand the discharge instructions provided? Yes  Medications obtained and verified? Yes  Other? No  Any new allergies since your discharge? No  Dietary orders reviewed? Yes Do you have support at home? Yes   Home Care and Equipment/Supplies: Were home health services ordered? no If so, what is the name of the agency? N/a  Has the agency set up a time to come to the patient's home? not applicable Were any new equipment or medical supplies ordered?  Yes: oxygen What is the name of the medical supply agency? [facility] Were you able to get the supplies/equipment? yes Do you have any questions related to the use of the equipment or supplies? No  Functional Questionnaire: (I = Independent and D = Dependent) ADLs: I  Bathing/Dressing- I  Meal Prep- D  Eating- I  Maintaining continence- I  Transferring/Ambulation- I  Managing Meds- I  Follow up appointments reviewed:  PCP Hospital f/u appt confirmed? Yes  Scheduled to see [name] on 7/26 @ 10. [facility] f/u appt confirmed? No   Are transportation arrangements needed? No  If their condition worsens, is the pt aware to call PCP or go to the Emergency Dept.? Yes Was the patient provided with contact information for the PCP's office or ED? Yes Was to pt encouraged to call back with questions or concerns? Yes

## 2021-01-01 ENCOUNTER — Encounter: Payer: Self-pay | Admitting: Family

## 2021-01-01 ENCOUNTER — Ambulatory Visit (INDEPENDENT_AMBULATORY_CARE_PROVIDER_SITE_OTHER): Payer: Medicare HMO | Admitting: Family

## 2021-01-01 VITALS — BP 172/70 | HR 69 | Temp 97.3°F | Ht 69.0 in | Wt 190.0 lb

## 2021-01-01 DIAGNOSIS — I509 Heart failure, unspecified: Secondary | ICD-10-CM | POA: Diagnosis not present

## 2021-01-01 DIAGNOSIS — R5381 Other malaise: Secondary | ICD-10-CM | POA: Diagnosis not present

## 2021-01-01 DIAGNOSIS — J1282 Pneumonia due to coronavirus disease 2019: Secondary | ICD-10-CM | POA: Diagnosis not present

## 2021-01-01 DIAGNOSIS — Z9119 Patient's noncompliance with other medical treatment and regimen: Secondary | ICD-10-CM | POA: Diagnosis not present

## 2021-01-01 DIAGNOSIS — Z09 Encounter for follow-up examination after completed treatment for conditions other than malignant neoplasm: Secondary | ICD-10-CM | POA: Diagnosis not present

## 2021-01-01 DIAGNOSIS — J449 Chronic obstructive pulmonary disease, unspecified: Secondary | ICD-10-CM

## 2021-01-01 DIAGNOSIS — G4700 Insomnia, unspecified: Secondary | ICD-10-CM

## 2021-01-01 DIAGNOSIS — U071 COVID-19: Secondary | ICD-10-CM

## 2021-01-01 DIAGNOSIS — R0602 Shortness of breath: Secondary | ICD-10-CM | POA: Diagnosis not present

## 2021-01-01 DIAGNOSIS — M7989 Other specified soft tissue disorders: Secondary | ICD-10-CM

## 2021-01-01 DIAGNOSIS — Z91199 Patient's noncompliance with other medical treatment and regimen due to unspecified reason: Secondary | ICD-10-CM

## 2021-01-01 LAB — CBC WITH DIFFERENTIAL/PLATELET
Basophils Absolute: 0 10*3/uL (ref 0.0–0.2)
Basos: 0 %
EOS (ABSOLUTE): 0.1 10*3/uL (ref 0.0–0.4)
Eos: 1 %
Hematocrit: 44.7 % (ref 37.5–51.0)
Hemoglobin: 15.2 g/dL (ref 13.0–17.7)
Immature Grans (Abs): 0.1 10*3/uL (ref 0.0–0.1)
Immature Granulocytes: 1 %
Lymphocytes Absolute: 0.9 10*3/uL (ref 0.7–3.1)
Lymphs: 11 %
MCH: 28.7 pg (ref 26.6–33.0)
MCHC: 34 g/dL (ref 31.5–35.7)
MCV: 85 fL (ref 79–97)
Monocytes Absolute: 1.1 10*3/uL — ABNORMAL HIGH (ref 0.1–0.9)
Monocytes: 13 %
Neutrophils Absolute: 6.3 10*3/uL (ref 1.4–7.0)
Neutrophils: 74 %
Platelets: 281 10*3/uL (ref 150–450)
RBC: 5.29 x10E6/uL (ref 4.14–5.80)
RDW: 13.3 % (ref 11.6–15.4)
WBC: 8.4 10*3/uL (ref 3.4–10.8)

## 2021-01-01 LAB — CMP14+EGFR
ALT: 14 IU/L (ref 0–44)
AST: 14 IU/L (ref 0–40)
Albumin/Globulin Ratio: 1.6 (ref 1.2–2.2)
Albumin: 3.6 g/dL — ABNORMAL LOW (ref 3.7–4.7)
Alkaline Phosphatase: 106 IU/L (ref 44–121)
BUN/Creatinine Ratio: 15 (ref 10–24)
BUN: 15 mg/dL (ref 8–27)
Bilirubin Total: 0.6 mg/dL (ref 0.0–1.2)
CO2: 30 mmol/L — ABNORMAL HIGH (ref 20–29)
Calcium: 8.9 mg/dL (ref 8.6–10.2)
Chloride: 96 mmol/L (ref 96–106)
Creatinine, Ser: 1 mg/dL (ref 0.76–1.27)
Globulin, Total: 2.3 g/dL (ref 1.5–4.5)
Glucose: 259 mg/dL — ABNORMAL HIGH (ref 65–99)
Potassium: 3.5 mmol/L (ref 3.5–5.2)
Sodium: 143 mmol/L (ref 134–144)
Total Protein: 5.9 g/dL — ABNORMAL LOW (ref 6.0–8.5)
eGFR: 80 mL/min/{1.73_m2} (ref >59)

## 2021-01-01 MED ORDER — MIRTAZAPINE 15 MG PO TABS
15.0000 mg | ORAL_TABLET | Freq: Every day | ORAL | 2 refills | Status: DC
Start: 2021-01-01 — End: 2021-06-06

## 2021-01-01 MED ORDER — FUROSEMIDE 20 MG PO TABS: 20.0000 mg | ORAL_TABLET | Freq: Two times a day (BID) | ORAL | 2 refills | Status: DC

## 2021-01-01 MED ORDER — BREZTRI AEROSPHERE 160-9-4.8 MCG/ACT IN AERO: 2.0000 | INHALATION_SPRAY | Freq: Two times a day (BID) | RESPIRATORY_TRACT | 0 refills | Status: DC

## 2021-01-01 NOTE — Progress Notes (Addendum)
Subjective:    Patient ID: Ronald [name], male    DOB: 04/29/1946, 75 y.o.   MRN: [id number]  Chief Complaint  Patient presents with   Hospitalization Follow-up    COVID    Today's visit was for Transitional Care Management.  The patient was discharged from [facility] on 12/23/20 with a primary diagnosis of COVID pneumonia and COPD exacerbation.   Contact with the patient and/or caregiver, by a clinical staff member, was made on 12/24/20 and was documented as a telephone encounter within the EMR.  Through chart review and discussion with the patient I have determined that management of their condition is of high complexity.    He went to the ED on 12/21/20 with increased SOB and found to be COVID positive. His oxygen was 87% and he was placed on 3 L of nasal cannula. His O2 sat was increased to 95 %. He was given Remdesivir and IV steroids.   He received three doses of remdesivir during hospitalization and d/c home with 5 days of prednisone.   Reports his breathing is improved, but continues to have SOB and cough. He needs a refill on Trelegy.   He reports his oxygen is too large for him to carry and needs a smaller oxygen concentrator.  Hypertension This is a chronic problem. The current episode started more than 1 year ago. The problem has been waxing and waning since onset. The problem is uncontrolled. Associated symptoms include malaise/fatigue, peripheral edema and shortness of breath. Risk factors for coronary artery disease include obesity.  Insomnia Primary symptoms: difficulty falling asleep, frequent awakening, malaise/fatigue.   The current episode started more than one year. The onset quality is gradual. The problem occurs intermittently.   COPD Continues to smoke a pack a day. Has SOB constantly.   Review of Systems  Constitutional:  Positive for malaise/fatigue.  Respiratory:  Positive for shortness of breath.   Psychiatric/Behavioral:  The patient has insomnia.    All other systems reviewed and are negative.     Objective:   Physical Exam Vitals reviewed.  Constitutional:      General: He is not in acute distress.    Appearance: He is well-developed. He is obese.  HENT:     Head: Normocephalic.     Right Ear: Tympanic membrane normal.     Left Ear: Tympanic membrane normal.  Eyes:     General:        Right eye: No discharge.        Left eye: No discharge.     Pupils: Pupils are equal, round, and reactive to light.  Neck:     Thyroid: No thyromegaly.  Cardiovascular:     Rate and Rhythm: Normal rate and regular rhythm.     Heart sounds: Murmur heard.  Pulmonary:     Effort: Pulmonary effort is normal. No respiratory distress.     Breath sounds: Wheezing and rhonchi present.  Abdominal:     General: Bowel sounds are normal. There is no distension.     Palpations: Abdomen is soft.     Tenderness: There is no abdominal tenderness.  Musculoskeletal:        General: No tenderness.     Cervical back: Normal range of motion and neck supple.     Right lower leg: Edema present.     Left lower leg: Edema present.     Comments: Lymphedema present in bilateral arms  Skin:    General: Skin is warm and  dry.     Findings: No erythema or rash.  Neurological:     Mental Status: He is alert and oriented to person, place, and time.     Cranial Nerves: No cranial nerve deficit.     Deep Tendon Reflexes: Reflexes are normal and symmetric.  Psychiatric:        Behavior: Behavior normal.        Thought Content: Thought content normal.        Judgment: Judgment normal.      BP (!) 172/70   Pulse 69   Temp (!) 97.3 F (36.3 C) (Temporal)   Ht 5' 9"  (1.753 m)   Wt 190 lb (86.2 kg)   SpO2 (!) 86%   BMI 28.06 kg/m      Assessment & Plan:  Ronald [name] comes in today with chief complaint of Hospitalization Follow-up (COVID )   Diagnosis and orders addressed:  1. Insomnia, unspecified type - mirtazapine (REMERON) 15 MG tablet; Take 1  tablet (15 mg total) by mouth at bedtime.  Dispense: 90 tablet; Refill: 2 - Ambulatory referral to [place] with Differential/Platelet  2. SOB (shortness of breath) - furosemide (LASIX) 20 MG tablet; Take 1 tablet (20 mg total) by mouth 2 (two) times daily.  Dispense: 60 tablet; Refill: 2 - Ambulatory referral to [place] with Differential/Platelet  3. Left upper extremity swelling - furosemide (LASIX) 20 MG tablet; Take 1 tablet (20 mg total) by mouth 2 (two) times daily.  Dispense: 60 tablet; Refill: 2 - Ambulatory referral to [place] with Differential/Platelet  4. Shortness of breath - furosemide (LASIX) 20 MG tablet; Take 1 tablet (20 mg total) by mouth 2 (two) times daily.  Dispense: 60 tablet; Refill: 2 - Ambulatory referral to [place] with Differential/Platelet  5. Congestive heart failure, unspecified HF chronicity, unspecified heart failure type (HCC) - furosemide (LASIX) 20 MG tablet; Take 1 tablet (20 mg total) by mouth 2 (two) times daily.  Dispense: 60 tablet; Refill: 2 - Ambulatory referral to [place] with Differential/Platelet  6. Hospital discharge follow-up - Ambulatory referral to [place] with Differential/Platelet  7. Pneumonia due to COVID-19 virus - Ambulatory referral to [place] with Differential/Platelet - For home use only DME oxygen  8. Physical deconditioning - Ambulatory referral to [place] with Differential/Platelet  9. Chronic obstructive pulmonary disease, unspecified COPD type ([place]) Encourage smoking cessation  Continue Breztri daily - Budeson-Glycopyrrol-Formoterol (BREZTRI AEROSPHERE) 160-9-4.8 MCG/ACT AERO; Inhale 2 puffs into the lungs in the morning and at bedtime.  Dispense: 10.7 g; Refill: 0 - CMP14+EGFR - [facility] with Differential/Platelet - For home  use only DME oxygen  10. Noncompliance   Labs pending Hospital notes reviewed  I have placed a referral to Home health for strengthening exercises  I have also ordered a small oxygen concentrator for patient. He can not carry his current one around because of his weakness.  Health Maintenance reviewed Diet and exercise encouraged  Follow up plan: 1 month   [name], FNP

## 2021-01-01 NOTE — Patient Instructions (Signed)
Chronic Obstructive Pulmonary Disease °Chronic obstructive pulmonary disease (COPD) is a long-term (chronic) condition that affects the lungs. COPD is a general term that can be used to describe many different lung problems that cause lung inflammation and limit airflow, including chronic bronchitis and emphysema. °If you have COPD, your lung function will probably never return to normal. In most cases, it gets worse over time. However, there are steps you can take to slow the progression of the disease and improve your quality of life. °What are the causes? °This condition may be caused by: °Smoking. This is the most common cause. °Certain genes passed down through families. °What increases the risk? °The following factors may make you more likely to develop this condition: °Being exposed to secondhand smoke from cigarettes, pipes, or cigars. °Being exposed to chemicals and other irritants, such as fumes and dust in the work environment. °Having chronic lung conditions or infections. °What are the signs or symptoms? °Symptoms of this condition include: °Shortness of breath, especially during physical activity. °Chronic cough with a large amount of thick mucus. Sometimes, the cough may not have any mucus (dry cough). °Wheezing and rapid breathing. °Gray or bluish discoloration (cyanosis) of the skin, especially in the fingers, toes, or lips. °Feeling tired (fatigue). °Weight loss. °Chest tightness. °Frequent infections. °Episodes when breathing symptoms become much worse (exacerbations). °At the later stages of this disease, you may have swelling in the ankles, feet, or legs. °How is this diagnosed? °This condition is diagnosed based on: °Your medical history. °A physical exam. °You may also have tests, including: °Lung (pulmonary) function tests. This may include a spirometry test, which measures your ability to exhale properly. °Chest X-ray. °CT scan. °Blood tests. °How is this treated? °This condition may be  treated with: °Medicines. These may include inhaled rescue medicines to treat acute exacerbations as well as medicines that you take long-term (maintenance medicines) to prevent flare-ups of COPD. °Bronchodilators help treat COPD by dilating the airways to allow increased airflow and make your breathing more comfortable. °Steroids can reduce airway inflammation and help prevent exacerbations. °Smoking cessation. If you smoke, your health care provider may ask you to quit, and may also recommend therapy or replacement products to help you quit. °Pulmonary rehabilitation. This may involve working with a team of health care providers and specialists, such as respiratory, occupational, and physical therapists. °Exercise and physical activity. These are beneficial for nearly all people with COPD. °Nutrition therapy to gain weight, if you are underweight. °Oxygen. Supplemental oxygen therapy is only helpful if you have a low oxygen level in your blood (hypoxemia). °Lung surgery or transplant. °Palliative care. This is to help people with COPD feel comfortable when treatment is no longer working. °Follow these instructions at home: °Medicines °Take over-the-counter and prescription medicines only as told by your health care provider. This includes inhaled medicines and pills. °Talk to your health care provider before taking any cough or allergy medicines. You may need to avoid certain medicines that dry out your airways. °Lifestyle °If you smoke, the most important thing that you can do is to stop smoking. Continuing to smoke will cause the disease to progress faster. °Do not use any products that contain nicotine or tobacco. These products include cigarettes, chewing tobacco, and vaping devices, such as e-cigarettes. If you need help quitting, ask your health care provider. °Avoid exposure to things that irritate your lungs, such as smoke, chemicals, and fumes. °Stay active, but balance activity with periods of rest.  Exercise and physical   activity will help you maintain your ability to do things you want to do. °Learn and use relaxation techniques to manage stress and to control your breathing. °Get the right amount of sleep and get quality sleep. Most adults need 7 or more hours per night. °Eat healthy foods. Eating smaller, more frequent meals and resting before meals may help you maintain your strength. °Controlled breathing °Learn and use controlled breathing techniques as directed by your health care provider. Controlled breathing techniques include: °Pursed lip breathing. Start by breathing in (inhaling) through your nose for 1 second. Then, purse your lips as if you were going to whistle and breathe out (exhale) through the pursed lips for 2 seconds. °Diaphragmatic breathing. Start by putting one hand on your abdomen just above your waist. Inhale slowly through your nose. The hand on your abdomen should move out. Then purse your lips and exhale slowly. You should be able to feel the hand on your abdomen moving in as you exhale. ° °Controlled coughing °Learn and use controlled coughing to clear mucus from your lungs. Controlled coughing is a series of short, progressive coughs. The steps of controlled coughing are: °Lean your head slightly forward. °Breathe in deeply using diaphragmatic breathing. °Try to hold your breath for 3 seconds. °Keep your mouth slightly open while coughing twice. °Spit any mucus out into a tissue. °Rest and repeat the steps once or twice as needed. °General instructions °Make sure you receive all the vaccines that your health care provider recommends, especially the pneumococcal and influenza vaccines. Preventing infection and hospitalization is very important when you have COPD. °Drink enough fluid to keep your urine pale yellow, unless you have a medical condition that requires fluid restriction. °Use oxygen therapy and pulmonary rehabilitation if told by your health care provider. If you  require home oxygen therapy, ask your health care provider whether you should purchase a pulse oximeter to measure your oxygen level at home. °Work with your health care provider to develop a COPD action plan. This will help you know what steps to take if your condition gets worse. °Keep other chronic health conditions under control as told by your health care provider. °Avoid extreme temperature and humidity changes. °Avoid contact with people who have an illness that spreads from person to person (is contagious), such as viral infections or pneumonia. °Keep all follow-up visits. This is important. °Contact a health care provider if: °You are coughing up more mucus than usual. °There is a change in the color or thickness of your mucus. °Your breathing is more labored than usual. °Your breathing is faster than usual. °You have difficulty sleeping. °You need to use your rescue medicines or inhalers more often than expected. °You have trouble doing routine activities such as getting dressed or walking around the house. °Get help right away if: °You have shortness of breath while you are resting. °You have shortness of breath that prevents you from: °Being able to talk. °Performing your usual physical activities. °You have chest pain lasting longer than 5 minutes. °Your skin color is more blue (cyanotic) than usual. °You measure low oxygen saturations for longer than 5 minutes with a pulse oximeter. °You have a fever. °You feel too tired to breathe normally. °These symptoms may represent a serious problem that is an emergency. Do not wait to see if the symptoms will go away. Get medical help right away. Call your local emergency services (911 in the [place] Do not drive yourself to the hospital. °Summary °Chronic obstructive pulmonary   disease (COPD) is a long-term (chronic) condition that affects the lungs. °Your lung function will probably never return to normal. In most cases, it gets worse over time. However, there  are steps you can take to slow the progression of the disease and improve your quality of life. °Treatment for COPD may include taking medicines, quitting smoking, pulmonary rehabilitation, and changes to diet and exercise. As the disease progresses, you may need oxygen therapy, a lung transplant, or palliative care. °To help manage your condition, do not smoke, avoid exposure to things that irritate your lungs, stay up to date on all vaccines, and follow your health care provider's instructions for taking medicines. °This information is not intended to replace advice given to you by your health care provider. Make sure you discuss any questions you have with your health care provider. °Document Revised: 04/03/2020 Document Reviewed: 04/03/2020 °Elsevier Patient Education © 2022 [employer] ° °

## 2021-01-02 ENCOUNTER — Other Ambulatory Visit: Payer: Self-pay | Admitting: Family

## 2021-01-02 ENCOUNTER — Other Ambulatory Visit: Payer: Self-pay | Admitting: Family Medicine

## 2021-01-02 DIAGNOSIS — J439 Emphysema, unspecified: Secondary | ICD-10-CM

## 2021-01-02 MED ORDER — IPRATROPIUM-ALBUTEROL 0.5-2.5 (3) MG/3ML IN SOLN: RESPIRATORY_TRACT | 3 refills | Status: DC

## 2021-01-02 MED ORDER — DOXYCYCLINE HYCLATE 100 MG PO TABS: 100.0000 mg | ORAL_TABLET | Freq: Two times a day (BID) | ORAL | 0 refills | Status: DC

## 2021-01-02 NOTE — Telephone Encounter (Signed)
Doxycyline Prescription sent to pharmacy

## 2021-01-03 DIAGNOSIS — J439 Emphysema, unspecified: Secondary | ICD-10-CM | POA: Diagnosis not present

## 2021-01-04 ENCOUNTER — Encounter: Payer: Self-pay | Admitting: Family Medicine

## 2021-01-08 ENCOUNTER — Telehealth: Payer: Self-pay

## 2021-01-08 DIAGNOSIS — J9621 Acute and chronic respiratory failure with hypoxia: Secondary | ICD-10-CM | POA: Diagnosis not present

## 2021-01-08 DIAGNOSIS — I509 Heart failure, unspecified: Secondary | ICD-10-CM | POA: Diagnosis not present

## 2021-01-08 DIAGNOSIS — E1165 Type 2 diabetes mellitus with hyperglycemia: Secondary | ICD-10-CM | POA: Diagnosis not present

## 2021-01-08 DIAGNOSIS — J1282 Pneumonia due to coronavirus disease 2019: Secondary | ICD-10-CM | POA: Diagnosis not present

## 2021-01-08 DIAGNOSIS — I110 Hypertensive heart disease with heart failure: Secondary | ICD-10-CM | POA: Diagnosis not present

## 2021-01-08 DIAGNOSIS — J440 Chronic obstructive pulmonary disease with acute lower respiratory infection: Secondary | ICD-10-CM | POA: Diagnosis not present

## 2021-01-08 DIAGNOSIS — I890 Lymphedema, not elsewhere classified: Secondary | ICD-10-CM | POA: Diagnosis not present

## 2021-01-08 DIAGNOSIS — J9601 Acute respiratory failure with hypoxia: Secondary | ICD-10-CM | POA: Diagnosis not present

## 2021-01-08 DIAGNOSIS — J441 Chronic obstructive pulmonary disease with (acute) exacerbation: Secondary | ICD-10-CM | POA: Diagnosis not present

## 2021-01-08 DIAGNOSIS — U071 COVID-19: Secondary | ICD-10-CM | POA: Diagnosis not present

## 2021-01-08 DIAGNOSIS — E1140 Type 2 diabetes mellitus with diabetic neuropathy, unspecified: Secondary | ICD-10-CM | POA: Diagnosis not present

## 2021-01-08 NOTE — Chronic Care Management (AMB) (Signed)
  Chronic Care Management   Note  01/08/2021 Name: DYSEN EDMONDSON MRN: [id number] DOB: 1946/05/21  [name] is a 75 y.o. year old male who is a primary care patient of [name], FNP. I reached out to [name] by phone today in response to a referral sent by Mr. Osborne Oman Gorby's PCP, [name], FNP      Mr. Ferron was given information about Chronic Care Management services today including:  CCM service includes personalized support from designated clinical staff supervised by his physician, including individualized plan of care and coordination with other care providers 24/7 contact phone numbers for assistance for urgent and routine care needs. Service will only be billed when office clinical staff spend 20 minutes or more in a month to coordinate care. Only one practitioner may furnish and bill the service in a calendar month. The patient may stop CCM services at any time (effective at the end of the month) by phone call to the office staff. The patient will be responsible for cost sharing (co-pay) of up to 20% of the service fee (after annual deductible is met).  Patient agreed to services and verbal consent obtained.   Follow up plan: Telephone appointment with care management team member scheduled for:01/09/2021  Noreene Larsson, [place], [place], [place] [address] Direct Dial: [phone] [email or web address]: [email or web address]

## 2021-01-09 ENCOUNTER — Ambulatory Visit (INDEPENDENT_AMBULATORY_CARE_PROVIDER_SITE_OTHER): Payer: Medicare HMO | Admitting: *Deleted

## 2021-01-09 DIAGNOSIS — J449 Chronic obstructive pulmonary disease, unspecified: Secondary | ICD-10-CM

## 2021-01-09 DIAGNOSIS — I10 Essential (primary) hypertension: Secondary | ICD-10-CM

## 2021-01-09 DIAGNOSIS — R0602 Shortness of breath: Secondary | ICD-10-CM

## 2021-01-09 NOTE — Chronic Care Management (AMB) (Signed)
Chronic Care Management   CCM RN Visit Note  01/09/2021 Name: [name] MRN: [id number] DOB: [birth year]  Subjective: Ronald [name] is a 75 y.o. year old male who is a primary care patient of [name], FNP. The care management team was consulted for assistance with disease management and care coordination needs.    Engaged with patient's wife  for initial visit in response to provider referral for case management and/or care coordination services.   Consent to Services:  The patient was given the following information about Chronic Care Management services today, agreed to services, and gave verbal consent: 1. CCM service includes personalized support from designated clinical staff supervised by the primary care provider, including individualized plan of care and coordination with other care providers 2. 24/7 contact phone numbers for assistance for urgent and routine care needs. 3. Service will only be billed when office clinical staff spend 20 minutes or more in a month to coordinate care. 4. Only one practitioner may furnish and bill the service in a calendar month. 5.The patient may stop CCM services at any time (effective at the end of the month) by phone call to the office staff. 6. The patient will be responsible for cost sharing (co-pay) of up to 20% of the service fee (after annual deductible is met). Patient agreed to services and consent obtained.  Patient agreed to services and verbal consent obtained.   Assessment: Review of patient past medical history, allergies, medications, health status, including review of consultants reports, laboratory and other test data, was performed as part of comprehensive evaluation and provision of chronic care management services.   SDOH (Social Determinants of Health) assessments and interventions performed:  SDOH Interventions    Flowsheet Row Most Recent Value  SDOH Interventions   Physical Activity Interventions Other (Comments)   [limited by shortness of breath]  Social Connections Interventions Patient Refused        CCM Care Plan  Allergies  Allergen Reactions   Gabapentin Anxiety    Unknown reaction   Metformin And Related Rash    Outpatient Encounter Medications as of 01/09/2021  Medication Sig   albuterol (VENTOLIN HFA) 108 (90 Base) MCG/ACT inhaler INHALE 2 PUFFS EVERY 6 HOURS AS NEEEDED   ALPRAZolam (XANAX) 0.5 MG tablet Take 1 tablet (0.5 mg total) by mouth 2 (two) times daily as needed. for anxiety   amLODipine (NORVASC) 5 MG tablet Take 1 tablet (5 mg total) by mouth daily.   Ascorbic Acid (VITAMIN C) 1000 MG tablet Take 1,000 mg by mouth daily.   aspirin EC 81 MG tablet Take 1 tablet (81 mg total) by mouth daily.   atorvastatin (LIPITOR) 40 MG tablet TAKE 1 TABLET BY MOUTH ONCE DAILY IN THE EVENING   Blood Glucose Monitoring Suppl (ACCU-CHEK GUIDE ME) w/Device KIT Check BS daily Dx E11.9   Budeson-Glycopyrrol-Formoterol (BREZTRI AEROSPHERE) 160-9-4.8 MCG/ACT AERO Inhale 2 puffs into the lungs in the morning and at bedtime.   cholecalciferol (VITAMIN D3) 25 MCG (1000 UNIT) tablet Take 1,000 Units by mouth daily.   doxycycline (VIBRA-TABS) 100 MG tablet Take 1 tablet (100 mg total) by mouth 2 (two) times daily.   escitalopram (LEXAPRO) 20 MG tablet Take 1 tablet by mouth once daily   fluticasone (FLONASE) 50 MCG/ACT nasal spray Place 2 sprays into both nostrils daily.   furosemide (LASIX) 20 MG tablet Take 1 tablet (20 mg total) by mouth 2 (two) times daily.   glucose blood (ONE TOUCH ULTRA TEST) test  strip USE TO CHECK GLUCOSE ONCE DAILY   guaiFENesin (REFENESEN 400 PO) Take 2 tablets by mouth daily as needed (mucus).   ipratropium-albuterol (DUONEB) 0.5-2.5 (3) MG/3ML SOLN USE ONE VIAL BY NEBULIZER FOUR TIMES DAILY   isosorbide mononitrate (IMDUR) 30 MG 24 hr tablet Take 1 tablet (30 mg total) by mouth daily.   Lancets (ONETOUCH ULTRASOFT) lancets Use as instructed   DX E11.9   losartan (COZAAR)  100 MG tablet Take 100 mg by mouth daily.   metoprolol tartrate (LOPRESSOR) 50 MG tablet Take 1 tablet (50 mg total) by mouth 2 (two) times daily.   mirtazapine (REMERON) 15 MG tablet Take 1 tablet (15 mg total) by mouth at bedtime.   nicotine (NICODERM CQ - DOSED IN MG/24 HOURS) 14 mg/24hr patch Place 1 patch (14 mg total) onto the skin daily.   nitroGLYCERIN (NITROSTAT) 0.4 MG SL tablet DISSOLVE TAKE 1 TABLET UNDER THE TONGUE EVERY FIVE MINUTES AS NEEDED FOR CHEST PAIN   OXYGEN Inhale 3 L into the lungs daily. 24 hours a day   pantoprazole (PROTONIX) 40 MG tablet Take 1 tablet by mouth once daily   No facility-administered encounter medications on file as of 01/09/2021.    Patient Active Problem List   Diagnosis Date Noted   Acute respiratory failure with hypoxia ([place]) 12/22/2020   COVID-19 virus infection 12/21/2020   Elevated brain natriuretic peptide (BNP) level 12/21/2020   Elevated troponin 12/21/2020   Hypokalemia 12/21/2020   Hyperglycemia due to diabetes mellitus ([place]) 12/21/2020   Hyperlipidemia 12/21/2020   Acute on chronic respiratory failure with hypoxia ([place]) 12/21/2020   Cellulitis 09/25/2020   Chest pain 05/23/2020   Controlled substance agreement signed 04/03/2020   Benzodiazepine dependence ([place]) 04/03/2020   Dysphagia 02/27/2020   Diarrhea 02/27/2020   Tracheal stenosis 12/09/2019   Noncompliance 04/01/2019   Lymphedema 04/01/2019   CHF (congestive heart failure) ([place]) 08/27/2018   Aortic atherosclerosis ([place]) 05/11/2017   Depression 05/05/2017   GERD (gastroesophageal reflux disease) 01/16/2017   Diabetic neuropathy ([place]) 01/16/2017   Anxiety 02/14/2016   Chronic respiratory failure with hypoxia ([place]) 07/19/2014   Essential hypertension 07/19/2014   Cigarette smoker 07/19/2014   Chest pain at rest 06/22/2014   CAD in native artery 06/22/2014   Diabetes mellitus ([place]) 06/22/2014   COPD (chronic obstructive pulmonary disease) ([place]) 06/22/2014   Tobacco  abuse 06/22/2014   Acute encephalopathy 06/22/2014   Leukocytosis 06/22/2014    Conditions to be addressed/monitored:HTN, COPD, and DMII  Care Plan : [place]  Updates made by Ilean China, RN since 01/09/2021 12:00 AM     Problem: Chronic Disease Management Needs   Priority: Medium  Onset Date: 01/09/2021     Goal: Care Management and Care Coordination Needs Associated with COPD, nicotene dependence, DM, and HTN   Start Date: 01/09/2021  This Visit's Progress: Not on track  Priority: Medium  Note:   Current Barriers:  Chronic Disease Management support and education needs related to HTN, COPD, and DMII Non-adherence to smoking cessation recommendation  Non-adherence to prescribed oxygen treatment  RNCM Clinical Goal(s):  Patient will work with RN Case Manager to address needs related to HTN, COPD, and DMII and Limited social support and nicotine dependence  take all medications exactly as prescribed and will call provider for medication related questions through collaboration with RN Care manager, provider, and care team.   Interventions: 1:1 collaboration with primary care provider regarding development and update of comprehensive plan of care as evidenced  by provider attestation and co-signature Inter-disciplinary care team collaboration (see longitudinal plan of care) Evaluation of current treatment plan related to  self management and patient's adherence to plan as established by provider; Provided with RN Care Manager contact number and encouraged to reach out as needed   COPD: (Status: New goal.) Provided patient with basic written and verbal COPD education on self care/management/and exacerbation prevention; Advised patient to track and manage COPD triggers;  Provided instruction about proper use of medications used for management of COPD including inhalers; Provided education about and advised patient to utilize infection prevention strategies to reduce risk of  respiratory infection; Discussed the importance of adequate rest and management of fatigue with COPD; Assessed social determinant of health barriers;  Reviewed and discussed medications and affordability. Receiving Trelegy samples.  Chart reviewed including recent office notes, hosp notes, imaging reports, and lab results Discussed family/social support. Patient performs ADLs as able and wife provides assistance as needed. Son drives to appointments outside of local area. Does have a sister that calls and talks with him often.  Advised that 6 month follow-up with Dr Melvyn Novas is overdue. Provided wife with telephone number to call and schedule appointment. She was agreeable.  Discussed use of oxygen. Uses 2 liters via [place] continuosly inside the house using a floor concentrator. Has tanks for when leaving the house but they are too large for him to manage. PCP prescribed portable oxygen concentrator but patient does not qualify per [employer] because he does not use the portable tanks often enough. Patient chooses to either stay home or venture out with oxygen. Encouraged to use continuously. Discussed wife's concerns regarding covid and pneumonia and concern that he is not improving Offered to collaborate with front office staff to schedule a follow-up appt but he is scheduled with PCP for 01/17/21. They will keep that appt for now and call if anything gets worse. Advised to reach out to PCP with any new or worsening symptoms or to call 911 for any severe symptoms Discussed that last CXR while inpatient showed COPD but no acute issues, like pneumonia.  Encouraged to monitor and record oxygen level with pulse ox. Currently running around 90% with O2 and in the 80s% without Discussed mobility and ability to perform ADLs. Limited by shortness of breath. Advised that home health physical therapy has been ordered to help with strengthening. May need to consider pulmonary rehab. Can talk with pulmonologist or PCP  regarding this.   Smoking Cessation: (Status: New goal.) Reviewed smoking history:  currently smoking 1.5 ppd Reports smoking within 30 minutes of waking up Wife would like for him to stop smoking but he is not motivated at this time He does have nicotine replacement options at home but doesn't use them. Used patches in the hospital.  Verified that he does not smoke near to or while wearing oxygen  Evaluation of current treatment plan reviewed; Advised patient to discuss smoking cessation options with provider; Provided contact information for [place] Quit Line (1-800-QUIT-NOW); Reviewed smoking cessation techniques: removing cigarettes and smoking materials from environment, stress management, substitution of other forms of reinforcement, and support of family/friends  Patient Goals/Self-Care Activities: Patient will self administer medications as prescribed Patient will attend all scheduled provider appointments Patient will call provider office for new concerns or questions       Plan:Telephone follow up appointment with care management team member scheduled for:  01/25/21 with RNCM and The patient has been provided with contact information for the care management team  and has been advised to call with any health related questions or concerns.   Chong Sicilian, BSN, RN-BC Embedded Chronic Care Manager [facility] [place] [facility] Direct Dial: [phone]

## 2021-01-09 NOTE — Patient Instructions (Signed)
Visit Information   PATIENT GOALS:   Goals Addressed             This Visit's Progress    Stop Smoking       Timeframe:  Short-Term Goal Priority:  High Start Date:    01/09/21                         Expected End Date:  05/11/21                      Follow-up: 01/25/21  Use nicotine replacement like gum or patches Talk with PCP about other options to aid in smoking cessation Remove cigarettes from home Continue to not smoke inside the home      Track and Manage My Symptoms-COPD   Not on track    Timeframe:  Long-Range Goal Priority:  Medium Start Date:    01/09/21                         Expected End Date:  01/09/22                      Follow Up Date 07/28/20    Take all medications as prescribed Call PCP with any new or worsening symptoms and seek appropriate medical care as needed Use 3L of oxygen continuously. Take portable tanks when leaving the house.  Check and record oxygen level daily and as needed Call PCP with any readings below 90% Keep all medical appointments Call RN Care Manager as needed [phone] Schedule 6 month follow-up with pulmonologist, Dr Melvyn Novas Stop smoking. Consider using nicotine replacement such as patches or gum to help.   Why is this important?   Tracking your symptoms and other information about your health helps your doctor plan your care.  Write down the symptoms, the time of day, what you were doing and what medicine you are taking.  You will soon learn how to manage your symptoms.     Notes:         Consent to CCM Services: Ronald [name] was given information about Chronic Care Management services including:  CCM service includes personalized support from designated clinical staff supervised by his physician, including individualized plan of care and coordination with other care providers 24/7 contact phone numbers for assistance for urgent and routine care needs. Service will only be billed when office clinical staff spend 20 minutes  or more in a month to coordinate care. Only one practitioner may furnish and bill the service in a calendar month. The patient may stop CCM services at any time (effective at the end of the month) by phone call to the office staff. The patient will be responsible for cost sharing (co-pay) of up to 20% of the service fee (after annual deductible is met).  Patient agreed to services and verbal consent obtained.   Patient verbalizes understanding of instructions provided today and agrees to view in [place].   Plan:Telephone follow up appointment with care management team member scheduled for:  01/25/21 with RNCM and The patient has been provided with contact information for the care management team and has been advised to call with any health related questions or concerns.   Chong Sicilian, BSN, RN-BC Embedded Chronic Care Manager [facility] / [facility] Care Management Direct Dial: [phone]   CLINICAL CARE PLAN: Patient Care Plan: [place]  Problem Identified: Chronic Disease Management Needs   Priority: Medium  Onset Date: 01/09/2021     Goal: Care Management and Care Coordination Needs Associated with COPD, nicotene dependence, DM, and HTN   Start Date: 01/09/2021  This Visit's Progress: Not on track  Priority: Medium  Note:   Current Barriers:  Chronic Disease Management support and education needs related to HTN, COPD, and DMII Non-adherence to smoking cessation recommendation  Non-adherence to prescribed oxygen treatment  RNCM Clinical Goal(s):  Patient will work with RN Case Manager to address needs related to HTN, COPD, and DMII and Limited social support and nicotine dependence  take all medications exactly as prescribed and will call provider for medication related questions through collaboration with RN Care manager, provider, and care team.   Interventions: 1:1 collaboration with primary care provider regarding development and update of  comprehensive plan of care as evidenced by provider attestation and co-signature Inter-disciplinary care team collaboration (see longitudinal plan of care) Evaluation of current treatment plan related to  self management and patient's adherence to plan as established by provider; Provided with RN Care Manager contact number and encouraged to reach out as needed   COPD: (Status: New goal.) Provided patient with basic written and verbal COPD education on self care/management/and exacerbation prevention; Advised patient to track and manage COPD triggers;  Provided instruction about proper use of medications used for management of COPD including inhalers; Provided education about and advised patient to utilize infection prevention strategies to reduce risk of respiratory infection; Discussed the importance of adequate rest and management of fatigue with COPD; Assessed social determinant of health barriers;  Reviewed and discussed medications and affordability. Receiving Trelegy samples.  Chart reviewed including recent office notes, hosp notes, imaging reports, and lab results Discussed family/social support. Patient performs ADLs as able and wife provides assistance as needed. Son drives to appointments outside of local area. Does have a sister that calls and talks with him often.  Advised that 6 month follow-up with Dr Melvyn Novas is overdue. Provided wife with telephone number to call and schedule appointment. She was agreeable.  Discussed use of oxygen. Uses 2 liters via [place] continuosly inside the house using a floor concentrator. Has tanks for when leaving the house but they are too large for him to manage. PCP prescribed portable oxygen concentrator but patient does not qualify per [employer] because he does not use the portable tanks often enough. Patient chooses to either stay home or venture out with oxygen. Encouraged to use continuously. Discussed wife's concerns regarding covid and pneumonia and  concern that he is not improving Offered to collaborate with front office staff to schedule a follow-up appt but he is scheduled with PCP for 01/17/21. They will keep that appt for now and call if anything gets worse. Advised to reach out to PCP with any new or worsening symptoms or to call 911 for any severe symptoms Discussed that last CXR while inpatient showed COPD but no acute issues, like pneumonia.  Encouraged to monitor and record oxygen level with pulse ox. Currently running around 90% with O2 and in the 80s% without Discussed mobility and ability to perform ADLs. Limited by shortness of breath. Advised that home health physical therapy has been ordered to help with strengthening. May need to consider pulmonary rehab. Can talk with pulmonologist or PCP regarding this.   Smoking Cessation: (Status: New goal.) Reviewed smoking history:  currently smoking 1.5 ppd Reports smoking within 30 minutes of waking up Wife would like for  him to stop smoking but he is not motivated at this time He does have nicotine replacement options at home but doesn't use them. Used patches in the hospital.  Verified that he does not smoke near to or while wearing oxygen  Evaluation of current treatment plan reviewed; Advised patient to discuss smoking cessation options with provider; Provided contact information for [place] [facility]uit Line (1-800-QUIT-NOW); Reviewed smoking cessation techniques: removing cigarettes and smoking materials from environment, stress management, substitution of other forms of reinforcement, and support of family/friends  Patient Goals/Self-Care Activities: Patient will self administer medications as prescribed Patient will attend all scheduled provider appointments Patient will call provider office for new concerns or questions

## 2021-01-10 ENCOUNTER — Other Ambulatory Visit: Payer: Self-pay | Admitting: Family

## 2021-01-10 DIAGNOSIS — J441 Chronic obstructive pulmonary disease with (acute) exacerbation: Secondary | ICD-10-CM | POA: Diagnosis not present

## 2021-01-10 DIAGNOSIS — I509 Heart failure, unspecified: Secondary | ICD-10-CM | POA: Diagnosis not present

## 2021-01-10 DIAGNOSIS — F419 Anxiety disorder, unspecified: Secondary | ICD-10-CM

## 2021-01-10 DIAGNOSIS — F1320 Sedative, hypnotic or anxiolytic dependence, uncomplicated: Secondary | ICD-10-CM

## 2021-01-10 DIAGNOSIS — U071 COVID-19: Secondary | ICD-10-CM | POA: Diagnosis not present

## 2021-01-10 DIAGNOSIS — I890 Lymphedema, not elsewhere classified: Secondary | ICD-10-CM | POA: Diagnosis not present

## 2021-01-10 DIAGNOSIS — I110 Hypertensive heart disease with heart failure: Secondary | ICD-10-CM | POA: Diagnosis not present

## 2021-01-10 DIAGNOSIS — E1140 Type 2 diabetes mellitus with diabetic neuropathy, unspecified: Secondary | ICD-10-CM | POA: Diagnosis not present

## 2021-01-10 DIAGNOSIS — J440 Chronic obstructive pulmonary disease with acute lower respiratory infection: Secondary | ICD-10-CM | POA: Diagnosis not present

## 2021-01-10 DIAGNOSIS — J1282 Pneumonia due to coronavirus disease 2019: Secondary | ICD-10-CM | POA: Diagnosis not present

## 2021-01-10 DIAGNOSIS — J9621 Acute and chronic respiratory failure with hypoxia: Secondary | ICD-10-CM | POA: Diagnosis not present

## 2021-01-10 DIAGNOSIS — E1165 Type 2 diabetes mellitus with hyperglycemia: Secondary | ICD-10-CM | POA: Diagnosis not present

## 2021-01-11 ENCOUNTER — Telehealth: Payer: Self-pay | Admitting: Family Medicine

## 2021-01-11 DIAGNOSIS — F1320 Sedative, hypnotic or anxiolytic dependence, uncomplicated: Secondary | ICD-10-CM

## 2021-01-11 DIAGNOSIS — F419 Anxiety disorder, unspecified: Secondary | ICD-10-CM

## 2021-01-11 MED ORDER — ALPRAZOLAM 0.5 MG PO TABS: 0.5000 mg | ORAL_TABLET | Freq: Two times a day (BID) | ORAL | 2 refills | Status: DC | PRN

## 2021-01-11 NOTE — Telephone Encounter (Signed)
ALPRAZolam (XANAX) 0.5 MG tablet 60 tablet 2 09/12/2020

## 2021-01-11 NOTE — Telephone Encounter (Signed)
Xanax refill sent in since patient was just seen.

## 2021-01-11 NOTE — Telephone Encounter (Signed)
Patient aware and verbalized understanding. °

## 2021-01-15 ENCOUNTER — Ambulatory Visit (INDEPENDENT_AMBULATORY_CARE_PROVIDER_SITE_OTHER): Payer: Medicare HMO

## 2021-01-15 VITALS — Ht 69.0 in | Wt 190.0 lb

## 2021-01-15 DIAGNOSIS — Z0001 Encounter for general adult medical examination with abnormal findings: Secondary | ICD-10-CM | POA: Diagnosis not present

## 2021-01-15 DIAGNOSIS — E669 Obesity, unspecified: Secondary | ICD-10-CM | POA: Diagnosis not present

## 2021-01-15 DIAGNOSIS — I890 Lymphedema, not elsewhere classified: Secondary | ICD-10-CM | POA: Diagnosis not present

## 2021-01-15 DIAGNOSIS — I2510 Atherosclerotic heart disease of native coronary artery without angina pectoris: Secondary | ICD-10-CM | POA: Diagnosis not present

## 2021-01-15 DIAGNOSIS — E1140 Type 2 diabetes mellitus with diabetic neuropathy, unspecified: Secondary | ICD-10-CM | POA: Diagnosis not present

## 2021-01-15 DIAGNOSIS — E1169 Type 2 diabetes mellitus with other specified complication: Secondary | ICD-10-CM | POA: Diagnosis not present

## 2021-01-15 DIAGNOSIS — I509 Heart failure, unspecified: Secondary | ICD-10-CM | POA: Diagnosis not present

## 2021-01-15 DIAGNOSIS — J9621 Acute and chronic respiratory failure with hypoxia: Secondary | ICD-10-CM | POA: Diagnosis not present

## 2021-01-15 DIAGNOSIS — J440 Chronic obstructive pulmonary disease with acute lower respiratory infection: Secondary | ICD-10-CM | POA: Diagnosis not present

## 2021-01-15 DIAGNOSIS — Z0000 Encounter for general adult medical examination without abnormal findings: Secondary | ICD-10-CM

## 2021-01-15 DIAGNOSIS — J449 Chronic obstructive pulmonary disease, unspecified: Secondary | ICD-10-CM | POA: Diagnosis not present

## 2021-01-15 DIAGNOSIS — J441 Chronic obstructive pulmonary disease with (acute) exacerbation: Secondary | ICD-10-CM | POA: Diagnosis not present

## 2021-01-15 DIAGNOSIS — J1282 Pneumonia due to coronavirus disease 2019: Secondary | ICD-10-CM | POA: Diagnosis not present

## 2021-01-15 DIAGNOSIS — U071 COVID-19: Secondary | ICD-10-CM | POA: Diagnosis not present

## 2021-01-15 DIAGNOSIS — E1165 Type 2 diabetes mellitus with hyperglycemia: Secondary | ICD-10-CM | POA: Diagnosis not present

## 2021-01-15 DIAGNOSIS — I110 Hypertensive heart disease with heart failure: Secondary | ICD-10-CM | POA: Diagnosis not present

## 2021-01-15 NOTE — Progress Notes (Signed)
Subjective:   Ronald [name] is a 75 y.o. male who presents for Medicare Annual/Subsequent preventive examination.  Virtual Visit via Telephone Note  I connected with  Ronald [name] on 01/15/21 at 11:15 AM EDT by telephone and verified that I am speaking with the correct person using two identifiers.  Location: Patient: Home Provider: [facility] Persons participating in the virtual visit: patient/Nurse Health Advisor   I discussed the limitations, risks, security and privacy concerns of performing an evaluation and management service by telephone and the availability of in person appointments. The patient expressed understanding and agreed to proceed.  Interactive audio and video telecommunications were attempted between this nurse and patient, however failed, due to patient having technical difficulties OR patient did not have access to video capability.  We continued and completed visit with audio only.  Some vital signs may be absent or patient reported.   [name], LPN   Review of Systems     Cardiac Risk Factors include: advanced age (>75mn, >>20women);male gender;sedentary lifestyle;smoking/ tobacco exposure;diabetes mellitus;dyslipidemia;hypertension;Other (see comment), Risk factor comments: hx of MI, COPD, atherosclerosis     Objective:    Today's Vitals   01/15/21 1107  Weight: 190 lb (86.2 kg)  Height: 5' 9"  (1.753 m)   Body mass index is 28.06 kg/m.  Advanced Directives 01/15/2021 12/21/2020 10/16/2020 09/25/2020 07/18/2020 05/26/2020 05/24/2020  Does Patient Have a Medical Advance Directive? No No No No No No No  Would patient like information on creating a medical advance directive? No - Patient declined No - Patient declined No - Patient declined No - Patient declined No - Patient declined No - Patient declined No - Patient declined    Current Medications (verified) Outpatient Encounter Medications as of 01/15/2021  Medication Sig   albuterol (VENTOLIN HFA) 108 (90  Base) MCG/ACT inhaler INHALE 2 PUFFS EVERY 6 HOURS AS NEEEDED   ALPRAZolam (XANAX) 0.5 MG tablet Take 1 tablet (0.5 mg total) by mouth 2 (two) times daily as needed. for anxiety   amLODipine (NORVASC) 5 MG tablet Take 1 tablet (5 mg total) by mouth daily.   Ascorbic Acid (VITAMIN C) 1000 MG tablet Take 1,000 mg by mouth daily.   aspirin EC 81 MG tablet Take 1 tablet (81 mg total) by mouth daily.   atorvastatin (LIPITOR) 40 MG tablet TAKE 1 TABLET BY MOUTH ONCE DAILY IN THE EVENING   Blood Glucose Monitoring Suppl (ACCU-CHEK GUIDE ME) w/Device KIT Check BS daily Dx E11.9   Budeson-Glycopyrrol-Formoterol (BREZTRI AEROSPHERE) 160-9-4.8 MCG/ACT AERO Inhale 2 puffs into the lungs in the morning and at bedtime.   cholecalciferol (VITAMIN D3) 25 MCG (1000 UNIT) tablet Take 1,000 Units by mouth daily.   doxycycline (VIBRA-TABS) 100 MG tablet Take 1 tablet (100 mg total) by mouth 2 (two) times daily.   escitalopram (LEXAPRO) 20 MG tablet Take 1 tablet by mouth once daily   fluticasone (FLONASE) 50 MCG/ACT nasal spray Place 2 sprays into both nostrils daily.   furosemide (LASIX) 20 MG tablet Take 1 tablet (20 mg total) by mouth 2 (two) times daily.   glucose blood (ONE TOUCH ULTRA TEST) test strip USE TO CHECK GLUCOSE ONCE DAILY   guaiFENesin (REFENESEN 400 PO) Take 2 tablets by mouth daily as needed (mucus).   ipratropium-albuterol (DUONEB) 0.5-2.5 (3) MG/3ML SOLN USE ONE VIAL BY NEBULIZER FOUR TIMES DAILY   isosorbide mononitrate (IMDUR) 30 MG 24 hr tablet Take 1 tablet (30 mg total) by mouth daily.   Lancets ([facility]  ULTRASOFT) lancets Use as instructed   DX E11.9   losartan (COZAAR) 100 MG tablet Take 100 mg by mouth daily.   metoprolol tartrate (LOPRESSOR) 50 MG tablet Take 1 tablet (50 mg total) by mouth 2 (two) times daily.   mirtazapine (REMERON) 15 MG tablet Take 1 tablet (15 mg total) by mouth at bedtime.   nicotine (NICODERM CQ - DOSED IN MG/24 HOURS) 14 mg/24hr patch Place 1 patch (14 mg  total) onto the skin daily.   nitroGLYCERIN (NITROSTAT) 0.4 MG SL tablet DISSOLVE TAKE 1 TABLET UNDER THE TONGUE EVERY FIVE MINUTES AS NEEDED FOR CHEST PAIN   OXYGEN Inhale 3 L into the lungs daily. 24 hours a day   pantoprazole (PROTONIX) 40 MG tablet Take 1 tablet by mouth once daily   No facility-administered encounter medications on file as of 01/15/2021.    Allergies (verified) Gabapentin and Metformin and related   History: Past Medical History:  Diagnosis Date   Anxiety    Asthma    Chronic lower back pain    COPD (chronic obstructive pulmonary disease) (HCC)    Coronary artery disease    a. NSTEMI 05/2014 s/p DESx2 to LAD at [facility].   Depression    Educated about COVID-19 virus infection 03/06/2020   GERD (gastroesophageal reflux disease)    High cholesterol    Hypertension    NSTEMI (non-ST elevated myocardial infarction) ([place]) 05/2014   with stent placement   Sleep apnea    Stroke ([place]) 2017   anyeusym    TIA (transient ischemic attack)    "they say I've had some mini strokes; don't know when"; denies residual on 06/22/2014)   Type II diabetes mellitus ([place])    Ulcerative colitis ([place])    Past Surgical History:  Procedure Laterality Date   APPENDECTOMY     BIOPSY  07/20/2020   Procedure: BIOPSY;  Surgeon: Harvel Quale, MD;  [facility];  Service: Gastroenterology;;   CARDIAC CATHETERIZATION  [id number] X 3   CHOLECYSTECTOMY     COLONOSCOPY WITH PROPOFOL N/A 07/20/2020   Procedure: COLONOSCOPY WITH PROPOFOL;  Surgeon: Harvel Quale, MD;  [facility];  Service: Gastroenterology;  Laterality: N/A;  1:15   CORONARY ANGIOPLASTY WITH STENT PLACEMENT  05/2014   "2"   ESOPHAGEAL DILATION N/A 07/20/2020   Procedure: ESOPHAGEAL DILATION;  Surgeon: Harvel Quale, MD;  [facility];  Service: Gastroenterology;  Laterality: N/A;   ESOPHAGOGASTRODUODENOSCOPY (EGD) WITH PROPOFOL N/A 07/20/2020   Procedure:  ESOPHAGOGASTRODUODENOSCOPY (EGD) WITH PROPOFOL;  Surgeon: Harvel Quale, MD;  [facility];  Service: Gastroenterology;  Laterality: N/A;   LEFT HEART CATH AND CORONARY ANGIOGRAPHY N/A 05/25/2020   Procedure: LEFT HEART CATH AND CORONARY ANGIOGRAPHY;  Surgeon: Martinique, Peter M, MD;  Location: [facility];  Service: Cardiovascular;  Laterality: N/A;   POLYPECTOMY  07/20/2020   Procedure: POLYPECTOMY INTESTINAL;  Surgeon: Harvel Quale, MD;  [facility];  Service: Gastroenterology;;   TUMOR EXCISION Right ~ 1999   "side of my upper head"   Family History  Problem Relation Age of Onset   CAD Father    Lung cancer Brother        smoked   Cancer Brother        lung   Leukemia Sister    Dementia Sister    Stroke Mother    Emphysema Sister    Cancer Brother        lung  Social History   Socioeconomic History   Marital status: Soil scientist    Spouse name: [name]   Number of children: 1   Years of education: Not on file   Highest education level: Not on file  Occupational History   Occupation: Retired    Comment: Aeronautical engineer  Tobacco Use   Smoking status: Every Day    Packs/day: 1.50    Years: 48.00    Pack years: 72.00    Types: Cigarettes    Start date: 02/12/1966   Smokeless tobacco: Never   Tobacco comments:    smokes 5-6 cigarettes per day 06/07/2020  Vaping Use   Vaping Use: Never used  Substance and Sexual Activity   Alcohol use: Not Currently    Alcohol/week: 0.0 standard drinks   Drug use: No   Sexual activity: Never  Other Topics Concern   Not on file  Social History Narrative   Staying with his ex-wife, [name]   Has 2 children (today he told me one child 01/15/21)   Social Determinants of Health   Financial Resource Strain: Low Risk    Difficulty of Paying Living Expenses: Not very hard  Food Insecurity: No Food Insecurity   Worried About Charity fundraiser in the Last Year: Never true   [place]  in the Last Year: Never true  Transportation Needs: No Transportation Needs   Lack of Transportation (Medical): No   Lack of Transportation (Non-Medical): No  Physical Activity: Inactive   Days of Exercise per Week: 0 days   Minutes of Exercise per Session: 0 min  Stress: No Stress Concern Present   Feeling of Stress : Only a little  Social Connections: Moderately Isolated   Frequency of Communication with Friends and Family: Twice a week   Frequency of Social Gatherings with Friends and Family: Once a week   Attends Religious Services: Never   Marine scientist or Organizations: No   Attends Music therapist: Never   Marital Status: Living with partner    Tobacco Counseling Ready to quit: Not Answered Counseling given: Not Answered Tobacco comments: smokes 5-6 cigarettes per day 06/07/2020   Clinical Intake:  Pre-visit preparation completed: Yes  Pain : No/denies pain     BMI - recorded: 28.06 Nutritional Status: BMI 25 -29 Overweight Nutritional Risks: None Diabetes: No  How often do you need to have someone help you when you read instructions, pamphlets, or other written materials from your doctor or pharmacy?: 5 - Always  Nutrition Risk Assessment:  Has the patient had any N/V/D within the last 2 months?  No  Does the patient have any non-healing wounds?  No  Has the patient had any unintentional weight loss or weight gain?  No   Diabetes:  Is the patient diabetic?  Yes  If diabetic, was a CBG obtained today?  No  Did the patient bring in their glucometer from home?  No  How often do you monitor your CBG's? Hasn't been checking - doesn't know how to use glucometer.   Financial Strains and Diabetes Management:  Are you having any financial strains with the device, your supplies or your medication? No .  Does the patient want to be seen by Chronic Care Management for management of their diabetes?  Yes  Would the patient like to be referred  to a Nutritionist or for Diabetic Management?  No   Diabetic Exams:  Diabetic Eye Exam: Completed 06/2020.  Diabetic Foot Exam: Completed  09/12/2020. Pt has been advised about the importance in completing this exam. Pt is scheduled for diabetic foot exam on next year.    Interpreter Needed?: No  Information entered by :: Sinclaire Artiga, LPN   Activities of Daily Living In your present state of health, do you have any difficulty performing the following activities: 01/15/2021 12/21/2020  Hearing? N N  Vision? N Y  Difficulty concentrating or making decisions? Tempie Donning  Walking or climbing stairs? Y Y  Dressing or bathing? Y N  Doing errands, shopping? Y N  Comment he doesn't drive -  Conservation officer, nature and eating ? N -  Using the Toilet? N -  In the past six months, have you accidently leaked urine? Y -  Comment wears depends -  Do you have problems with loss of bowel control? Y -  Comment sometimes - wears depends -  Managing your Medications? Y -  Managing your Finances? Y -  Housekeeping or managing your Housekeeping? Y -  Some recent data might be hidden    Patient Care Team: [name], FNP as PCP - General (Family Medicine) Harl Bowie, Alphonse Guild, MD as PCP - Cardiology (Cardiology) Lavera Guise, [facility] (Pharmacist) Harlen Labs, MD as Referring Physician (Optometry) Ilean China, RN as [place] Management Franchot Gallo, MD as Consulting Physician (Urology) Early, Arvilla Meres, MD as Consulting Physician (Vascular Surgery)  Indicate any recent Medical Services you may have received from other than [facility] providers in the past year (date may be approximate).     Assessment:   This is a routine wellness examination for Yogi.  Hearing/Vision screen Hearing Screening - Comments:: Denies hearing difficulties  Vision Screening - Comments:: Wears eyeglasses - up to date with annual eye exams with Dr Marin Comment in [place]  Dietary issues and exercise activities  discussed: Current Exercise Habits: The patient does not participate in regular exercise at present, Exercise limited by: cardiac condition(s);respiratory conditions(s);psychological condition(s);orthopedic condition(s)   Goals Addressed             This Visit's Progress    Patient Stated   Not on track    01/04/2020 AWV Goal: Exercise for General Health  Patient will verbalize understanding of the benefits of increased physical activity: Exercising regularly is important. It will improve your overall fitness, flexibility, and endurance. Regular exercise also will improve your overall health. It can help you control your weight, reduce stress, and improve your bone density. Over the next year, patient will increase physical activity as tolerated with a goal of at least 150 minutes of moderate physical activity per week.  You can tell that you are exercising at a moderate intensity if your heart starts beating faster and you start breathing faster but can still hold a conversation. Moderate-intensity exercise ideas include: Walking 1 mile (1.6 km) in about 15 minutes Biking Hiking Golfing Dancing Water aerobics Patient will verbalize understanding of everyday activities that increase physical activity by providing examples like the following: Yard work, such as: Sales promotion account executive Gardening Washing windows or floors Patient will be able to explain general safety guidelines for exercising:  Before you start a new exercise program, talk with your health care provider. Do not exercise so much that you hurt yourself, feel dizzy, or get very short of breath. Wear comfortable clothes and wear shoes with good support. Drink plenty of water while you exercise to  prevent dehydration or heat stroke. Work out until your breathing and your heartbeat get faster.      Stop Smoking   On track    Timeframe:   Short-Term Goal Priority:  High Start Date:    01/09/21                         Expected End Date:  05/11/21                      Follow-up: 01/25/21  Use nicotine replacement like gum or patches Talk with PCP about other options to aid in smoking cessation Remove cigarettes from home Continue to not smoke inside the home        Depression Screen [facility] : [facility] 2/9 Scores 01/15/2021 10/12/2020 09/25/2020 09/12/2020 09/12/2020 06/14/2020 03/06/2020  PHQ - 2 Score 2 0 3 0 0 0 0  PHQ- 9 Score 9 - 12 0 - 5 0    Fall Risk Fall Risk  01/15/2021 10/12/2020 09/25/2020 09/12/2020 06/14/2020  Falls in the past year? 0 1 0 0 0  Number falls in past yr: 0 1 - - -  Injury with Fall? 0 1 - - -  Comment - - - - -  Risk for fall due to : Orthopedic patient;Medication side effect;Impaired vision;Impaired balance/gait History of fall(s) - - -  Follow up Education provided;Falls prevention discussed - - - -    FALL RISK PREVENTION PERTAINING TO THE HOME:  Any stairs in or around the home? Yes  If so, are there any without handrails? Yes  - he avoids these Home free of loose throw rugs in walkways, pet beds, electrical cords, etc? Yes  Adequate lighting in your home to reduce risk of falls? Yes   ASSISTIVE DEVICES UTILIZED TO PREVENT FALLS:  Life alert? No  Use of a cane, walker or w/c? No  Grab bars in the bathroom? Yes  Shower chair or bench in shower? Yes  Elevated toilet seat or a handicapped toilet? Yes   TIMED UP AND GO:  Was the test performed? No . Telephonic visit  Cognitive Function: Cognitive status assessed by direct observation. Patient is unable to complete screening 6CIT or MMSE. He refuses     6CIT Screen 01/04/2020 12/24/2018  What Year? 0 points 4 points  What month? 0 points 0 points  What time? 0 points 0 points  Count back from 20 0 points 0 points  Months in reverse 2 points 2 points  Repeat phrase 2 points 10 points  Total Score 4 16    Immunizations Immunization History  Administered  Date(s) Administered   Fluad Quad(high Dose 65+) 04/01/2019   Influenza Split 03/09/2014   Influenza-Unspecified 08/20/2016   Pneumococcal Conjugate-13 11/19/2017   Pneumococcal Polysaccharide-23 08/20/2016   Tdap 10/06/1996, 06/13/2001, 09/12/2020    TDAP status: Up to date  Flu Vaccine status: Due, Education has been provided regarding the importance of this vaccine. Advised may receive this vaccine at local pharmacy or Health Dept. Aware to provide a copy of the vaccination record if obtained from local pharmacy or Health Dept. Verbalized acceptance and understanding.  Pneumococcal vaccine status: Up to date  Covid-19 vaccine status: Declined, Education has been provided regarding the importance of this vaccine but patient still declined. Advised may receive this vaccine at local pharmacy or Health Dept.or vaccine clinic. Aware to provide a copy of the vaccination record if obtained from local pharmacy or Health Dept. Verbalized  acceptance and understanding.  Qualifies for Shingles Vaccine? Yes   Zostavax completed No   Shingrix Completed?: No.    Education has been provided regarding the importance of this vaccine. Patient has been advised to call insurance company to determine out of pocket expense if they have not yet received this vaccine. Advised may also receive vaccine at local pharmacy or Health Dept. Verbalized acceptance and understanding.  Screening Tests Health Maintenance  Topic Date Due   COVID-19 Vaccine (1) Never done   OPHTHALMOLOGY EXAM  Never done   Zoster Vaccines- Shingrix (1 of 2) Never done   INFLUENZA VACCINE  01/07/2021   HEMOGLOBIN A1C  06/24/2021   FOOT EXAM  09/12/2021   COLONOSCOPY (Pts 45-84yr Insurance coverage will need to be confirmed)  07/20/2030   TETANUS/TDAP  09/13/2030   Hepatitis C Screening  Completed   PNA vac Low Risk Adult  Completed   HPV VACCINES  Aged Out    Health Maintenance  Health Maintenance Due  Topic Date Due   COVID-19  Vaccine (1) Never done   OPHTHALMOLOGY EXAM  Never done   Zoster Vaccines- Shingrix (1 of 2) Never done   INFLUENZA VACCINE  01/07/2021    Colorectal cancer screening: Type of screening: Colonoscopy. Completed 07/20/2020. Repeat every 3 years  Lung Cancer Screening: (Low Dose CT Chest recommended if Age 73-80years, 30 pack-year currently smoking OR have quit w/in 15years.) does qualify.   Lung Cancer Screening Referral:  has these annually  Additional Screening:  Hepatitis C Screening: does qualify; Completed 02/14/2016  Vision Screening: Recommended annual ophthalmology exams for early detection of glaucoma and other disorders of the eye. Is the patient up to date with their annual eye exam?  Yes  Who is the provider or what is the name of the office in which the patient attends annual eye exams? Y[facility] SarIf pt is not established with a provider, would they like to be referred to a provider to establish care? No .   Dental Screening: Recommended annual dental exams for proper oral hygiene  Community Resource Referral / Chronic Care Management: CRR required this visit?  No   CCM required this visit?  No      Plan:     I have personally reviewed and noted the following in the patient's chart:   Medical and social history Use of alcohol, tobacco or illicit drugs  Current medications and supplements including opioid prescriptions. Patient is not currently taking opioid prescriptions. Functional ability and status Nutritional status Physical activity Advanced directives List of other physicians Hospitalizations, surgeries, and ER visits in previous 12 months Vitals Screenings to include cognitive, depression, and falls Referrals and appointments  In addition, I have reviewed and discussed with patient certain preventive protocols, quality metrics, and best practice recommendations. A written personalized care plan for preventive services as well as general preventive health  recommendations were provided to patient.     ASandrea Hammond LPN   [id number]  Nurse Notes: None

## 2021-01-15 NOTE — Patient Instructions (Signed)
Ronald [name] , Thank you for taking time to come for your Medicare Wellness Visit. I appreciate your ongoing commitment to your health goals. Please review the following plan we discussed and let me know if I can assist you in the future.   Screening recommendations/referrals: Colonoscopy: Done 07/20/2020 - Repeat in 3 years Recommended yearly ophthalmology/optometry visit for glaucoma screening and checkup Recommended yearly dental visit for hygiene and checkup  Vaccinations: Influenza vaccine: Due every fall Pneumococcal vaccine: Done 08/20/2016 & 11/19/2017 Tdap vaccine: Done 09/12/2020 - Repeat in 10 years Shingles vaccine: Due. Shingrix discussed. Please contact your pharmacy for coverage information.     Covid-19: Declined  Advanced directives: Advance directive discussed with you today. Even though you declined this today, please call our office should you change your mind, and we can give you the proper paperwork for you to fill out.   Conditions/risks identified: work on diet and exercise. Increase physical activity by 5 minutes every week as tolerated until you can do 30 minutes  Next appointment: Follow up in one year for your annual wellness visit.   Preventive Care 73 Years and Older, Male  Preventive care refers to lifestyle choices and visits with your health care provider that can promote health and wellness. What does preventive care include? A yearly physical exam. This is also called an annual well check. Dental exams once or twice a year. Routine eye exams. Ask your health care provider how often you should have your eyes checked. Personal lifestyle choices, including: Daily care of your teeth and gums. Regular physical activity. Eating a healthy diet. Avoiding tobacco and drug use. Limiting alcohol use. Practicing safe sex. Taking low doses of aspirin every day. Taking vitamin and mineral supplements as recommended by your health care provider. What happens during  an annual well check? The services and screenings done by your health care provider during your annual well check will depend on your age, overall health, lifestyle risk factors, and family history of disease. Counseling  Your health care provider may ask you questions about your: Alcohol use. Tobacco use. Drug use. Emotional well-being. Home and relationship well-being. Sexual activity. Eating habits. History of falls. Memory and ability to understand (cognition). Work and work Statistician. Screening  You may have the following tests or measurements: Height, weight, and BMI. Blood pressure. Lipid and cholesterol levels. These may be checked every 5 years, or more frequently if you are over 11 years old. Skin check. Lung cancer screening. You may have this screening every year starting at age 50 if you have a 30-pack-year history of smoking and currently smoke or have quit within the past 15 years. Fecal occult blood test (FOBT) of the stool. You may have this test every year starting at age 30. Flexible sigmoidoscopy or colonoscopy. You may have a sigmoidoscopy every 5 years or a colonoscopy every 10 years starting at age 37. Prostate cancer screening. Recommendations will vary depending on your family history and other risks. Hepatitis C blood test. Hepatitis B blood test. Sexually transmitted disease (STD) testing. Diabetes screening. This is done by checking your blood sugar (glucose) after you have not eaten for a while (fasting). You may have this done every 1-3 years. Abdominal aortic aneurysm (AAA) screening. You may need this if you are a current or former smoker. Osteoporosis. You may be screened starting at age 17 if you are at high risk. Talk with your health care provider about your test results, treatment options, and if necessary, the need  for more tests. Vaccines  Your health care provider may recommend certain vaccines, such as: Influenza vaccine. This is recommended  every year. Tetanus, diphtheria, and acellular pertussis (Tdap, Td) vaccine. You may need a Td booster every 10 years. Zoster vaccine. You may need this after age 68. Pneumococcal 13-valent conjugate (PCV13) vaccine. One dose is recommended after age 51. Pneumococcal polysaccharide (PPSV23) vaccine. One dose is recommended after age 94. Talk to your health care provider about which screenings and vaccines you need and how often you need them. This information is not intended to replace advice given to you by your health care provider. Make sure you discuss any questions you have with your health care provider. Document Released: 06/22/2015 Document Revised: 02/13/2016 Document Reviewed: 03/27/2015 Elsevier Interactive Patient Education  2017 [place] Prevention in the Home Falls can cause injuries. They can happen to people of all ages. There are many things you can do to make your home safe and to help prevent falls. What can I do on the outside of my home? Regularly fix the edges of walkways and driveways and fix any cracks. Remove anything that might make you trip as you walk through a door, such as a raised step or threshold. Trim any bushes or trees on the path to your home. Use bright outdoor lighting. Clear any walking paths of anything that might make someone trip, such as rocks or tools. Regularly check to see if handrails are loose or broken. Make sure that both sides of any steps have handrails. Any raised decks and porches should have guardrails on the edges. Have any leaves, snow, or ice cleared regularly. Use sand or salt on walking paths during winter. Clean up any spills in your garage right away. This includes oil or grease spills. What can I do in the bathroom? Use night lights. Install grab bars by the toilet and in the tub and shower. Do not use towel bars as grab bars. Use non-skid mats or decals in the tub or shower. If you need to sit down in the shower,  use a plastic, non-slip stool. Keep the floor dry. Clean up any water that spills on the floor as soon as it happens. Remove soap buildup in the tub or shower regularly. Attach bath mats securely with double-sided non-slip rug tape. Do not have throw rugs and other things on the floor that can make you trip. What can I do in the bedroom? Use night lights. Make sure that you have a light by your bed that is easy to reach. Do not use any sheets or blankets that are too big for your bed. They should not hang down onto the floor. Have a firm chair that has side arms. You can use this for support while you get dressed. Do not have throw rugs and other things on the floor that can make you trip. What can I do in the kitchen? Clean up any spills right away. Avoid walking on wet floors. Keep items that you use a lot in easy-to-reach places. If you need to reach something above you, use a strong step stool that has a grab bar. Keep electrical cords out of the way. Do not use floor polish or wax that makes floors slippery. If you must use wax, use non-skid floor wax. Do not have throw rugs and other things on the floor that can make you trip. What can I do with my stairs? Do not leave any items on the stairs.  Make sure that there are handrails on both sides of the stairs and use them. Fix handrails that are broken or loose. Make sure that handrails are as long as the stairways. Check any carpeting to make sure that it is firmly attached to the stairs. Fix any carpet that is loose or worn. Avoid having throw rugs at the top or bottom of the stairs. If you do have throw rugs, attach them to the floor with carpet tape. Make sure that you have a light switch at the top of the stairs and the bottom of the stairs. If you do not have them, ask someone to add them for you. What else can I do to help prevent falls? Wear shoes that: Do not have high heels. Have rubber bottoms. Are comfortable and fit you  well. Are closed at the toe. Do not wear sandals. If you use a stepladder: Make sure that it is fully opened. Do not climb a closed stepladder. Make sure that both sides of the stepladder are locked into place. Ask someone to hold it for you, if possible. Clearly mark and make sure that you can see: Any grab bars or handrails. First and last steps. Where the edge of each step is. Use tools that help you move around (mobility aids) if they are needed. These include: Canes. Walkers. Scooters. Crutches. Turn on the lights when you go into a dark area. Replace any light bulbs as soon as they burn out. Set up your furniture so you have a clear path. Avoid moving your furniture around. If any of your floors are uneven, fix them. If there are any pets around you, be aware of where they are. Review your medicines with your doctor. Some medicines can make you feel dizzy. This can increase your chance of falling. Ask your doctor what other things that you can do to help prevent falls. This information is not intended to replace advice given to you by your health care provider. Make sure you discuss any questions you have with your health care provider. Document Released: 03/22/2009 Document Revised: 11/01/2015 Document Reviewed: 06/30/2014 Elsevier Interactive Patient Education  2017 [employer].

## 2021-01-17 ENCOUNTER — Ambulatory Visit (INDEPENDENT_AMBULATORY_CARE_PROVIDER_SITE_OTHER): Payer: Medicare HMO | Admitting: Family

## 2021-01-17 ENCOUNTER — Encounter: Payer: Self-pay | Admitting: Family

## 2021-01-17 ENCOUNTER — Other Ambulatory Visit: Payer: Self-pay

## 2021-01-17 VITALS — BP 153/71 | HR 66 | Temp 98.0°F | Wt 192.2 lb

## 2021-01-17 DIAGNOSIS — I110 Hypertensive heart disease with heart failure: Secondary | ICD-10-CM | POA: Diagnosis not present

## 2021-01-17 DIAGNOSIS — Z79891 Long term (current) use of opiate analgesic: Secondary | ICD-10-CM | POA: Diagnosis not present

## 2021-01-17 DIAGNOSIS — F331 Major depressive disorder, recurrent, moderate: Secondary | ICD-10-CM

## 2021-01-17 DIAGNOSIS — I890 Lymphedema, not elsewhere classified: Secondary | ICD-10-CM | POA: Diagnosis not present

## 2021-01-17 DIAGNOSIS — J449 Chronic obstructive pulmonary disease, unspecified: Secondary | ICD-10-CM

## 2021-01-17 DIAGNOSIS — E782 Mixed hyperlipidemia: Secondary | ICD-10-CM

## 2021-01-17 DIAGNOSIS — F1320 Sedative, hypnotic or anxiolytic dependence, uncomplicated: Secondary | ICD-10-CM | POA: Diagnosis not present

## 2021-01-17 DIAGNOSIS — Z9119 Patient's noncompliance with other medical treatment and regimen: Secondary | ICD-10-CM

## 2021-01-17 DIAGNOSIS — J440 Chronic obstructive pulmonary disease with acute lower respiratory infection: Secondary | ICD-10-CM | POA: Diagnosis not present

## 2021-01-17 DIAGNOSIS — K219 Gastro-esophageal reflux disease without esophagitis: Secondary | ICD-10-CM

## 2021-01-17 DIAGNOSIS — I10 Essential (primary) hypertension: Secondary | ICD-10-CM | POA: Diagnosis not present

## 2021-01-17 DIAGNOSIS — I700 Atherosclerosis of aorta: Secondary | ICD-10-CM | POA: Diagnosis not present

## 2021-01-17 DIAGNOSIS — Z79899 Other long term (current) drug therapy: Secondary | ICD-10-CM

## 2021-01-17 DIAGNOSIS — I2510 Atherosclerotic heart disease of native coronary artery without angina pectoris: Secondary | ICD-10-CM

## 2021-01-17 DIAGNOSIS — F419 Anxiety disorder, unspecified: Secondary | ICD-10-CM | POA: Diagnosis not present

## 2021-01-17 DIAGNOSIS — E1142 Type 2 diabetes mellitus with diabetic polyneuropathy: Secondary | ICD-10-CM

## 2021-01-17 DIAGNOSIS — E1140 Type 2 diabetes mellitus with diabetic neuropathy, unspecified: Secondary | ICD-10-CM | POA: Diagnosis not present

## 2021-01-17 DIAGNOSIS — E1165 Type 2 diabetes mellitus with hyperglycemia: Secondary | ICD-10-CM | POA: Diagnosis not present

## 2021-01-17 DIAGNOSIS — J1282 Pneumonia due to coronavirus disease 2019: Secondary | ICD-10-CM | POA: Diagnosis not present

## 2021-01-17 DIAGNOSIS — J9621 Acute and chronic respiratory failure with hypoxia: Secondary | ICD-10-CM

## 2021-01-17 DIAGNOSIS — E669 Obesity, unspecified: Secondary | ICD-10-CM

## 2021-01-17 DIAGNOSIS — I509 Heart failure, unspecified: Secondary | ICD-10-CM | POA: Diagnosis not present

## 2021-01-17 DIAGNOSIS — U071 COVID-19: Secondary | ICD-10-CM | POA: Diagnosis not present

## 2021-01-17 DIAGNOSIS — R69 Illness, unspecified: Secondary | ICD-10-CM | POA: Diagnosis not present

## 2021-01-17 DIAGNOSIS — J441 Chronic obstructive pulmonary disease with (acute) exacerbation: Secondary | ICD-10-CM | POA: Diagnosis not present

## 2021-01-17 DIAGNOSIS — F17210 Nicotine dependence, cigarettes, uncomplicated: Secondary | ICD-10-CM

## 2021-01-17 DIAGNOSIS — Z91199 Patient's noncompliance with other medical treatment and regimen due to unspecified reason: Secondary | ICD-10-CM

## 2021-01-17 DIAGNOSIS — E1169 Type 2 diabetes mellitus with other specified complication: Secondary | ICD-10-CM

## 2021-01-17 MED ORDER — ALPRAZOLAM 0.5 MG PO TABS: 0.5000 mg | ORAL_TABLET | Freq: Two times a day (BID) | ORAL | 2 refills | Status: DC | PRN
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
## 2021-01-17 NOTE — Progress Notes (Signed)
Subjective:    Patient ID: Ronald [name], male    DOB: Jun 09, 1946, 75 y.o.   MRN: [id number]  Chief Complaint  Patient presents with   Medical Management of Chronic Issues   PT presents to the office today for chronic follow up. PT is followed by Cardiologists. Pt's BP is elevated today. He is noncompliant with his medications and appointments. He can not read and this does make his medication adherence difficult at times. His ex-wife does his medications for him. He is followed by ENT for hx of trach. He is followed by Pulmonologist for COPD. Followed by Cardiologists for CAD and CHF.    Hypertension This is a chronic problem. The current episode started more than 1 year ago. The problem has been waxing and waning since onset. The problem is uncontrolled. Associated symptoms include blurred vision, malaise/fatigue, peripheral edema and shortness of breath. Risk factors for coronary artery disease include diabetes mellitus, dyslipidemia, obesity and male gender. The current treatment provides moderate improvement. Hypertensive end-organ damage includes CAD/MI and heart failure.  Congestive Heart Failure Presents for follow-up visit. Associated symptoms include edema, fatigue and shortness of breath. The symptoms have been worsening.  Gastroesophageal Reflux He complains of belching, heartburn and a hoarse voice. This is a chronic problem. The current episode started more than 1 year ago. The problem occurs occasionally. The problem has been waxing and waning. Associated symptoms include fatigue. Risk factors include obesity. He has tried a PPI for the symptoms.  Diabetes He presents for his follow-up diabetic visit. He has type 2 diabetes mellitus. Hypoglycemia symptoms include nervousness/anxiousness. Associated symptoms include blurred vision and fatigue. Symptoms are stable. Diabetic complications include heart disease and nephropathy. Pertinent negatives for diabetic complications include no  peripheral neuropathy. Risk factors for coronary artery disease include dyslipidemia, diabetes mellitus, male sex, hypertension and sedentary lifestyle. He is following a generally unhealthy diet. (Not checking at home ) An ACE inhibitor/angiotensin II receptor blocker is being taken. Eye exam is current.  Hyperlipidemia This is a chronic problem. The current episode started more than 1 year ago. The problem is controlled. Recent lipid tests were reviewed and are normal. Exacerbating diseases include obesity. Associated symptoms include shortness of breath. Current antihyperlipidemic treatment includes statins. The current treatment provides moderate improvement of lipids. Risk factors for coronary artery disease include dyslipidemia, diabetes mellitus, male sex, hypertension and a sedentary lifestyle.  Nicotine Dependence Presents for follow-up visit. Symptoms include fatigue and irritability. His urge triggers include company of smokers. The symptoms have been stable. He smokes 1 pack of cigarettes per day.  Anxiety Presents for follow-up visit. Symptoms include depressed mood, excessive worry, irritability, nervous/anxious behavior and shortness of breath. Symptoms occur most days. The severity of symptoms is moderate.       Review of Systems  Constitutional:  Positive for fatigue, irritability and malaise/fatigue.  HENT:  Positive for hoarse voice.   Eyes:  Positive for blurred vision.  Respiratory:  Positive for shortness of breath.   Gastrointestinal:  Positive for heartburn.  Psychiatric/Behavioral:  The patient is nervous/anxious.   All other systems reviewed and are negative.     Objective:   Physical Exam Vitals reviewed.  Constitutional:      General: He is not in acute distress.    Appearance: He is well-developed. He is obese.  HENT:     Head: Normocephalic.     Right Ear: Tympanic membrane normal.     Left Ear: Tympanic membrane normal.  Eyes:  General:         Right eye: No discharge.        Left eye: No discharge.     Pupils: Pupils are equal, round, and reactive to light.  Neck:     Thyroid: No thyromegaly.  Cardiovascular:     Rate and Rhythm: Normal rate and regular rhythm.     Heart sounds: Normal heart sounds. No murmur heard. Pulmonary:     Effort: Pulmonary effort is normal. No respiratory distress.     Breath sounds: Wheezing present.  Abdominal:     General: Bowel sounds are normal. There is no distension.     Palpations: Abdomen is soft.     Tenderness: There is no abdominal tenderness.  Musculoskeletal:        General: No tenderness. Normal range of motion.     Cervical back: Normal range of motion and neck supple.     Right lower leg: Edema (trace) present.     Left lower leg: Edema (trace) present.     Comments: Bilateral lymphedema in upper extremity   Skin:    General: Skin is warm and dry.     Findings: No erythema or rash.  Neurological:     Mental Status: He is alert and oriented to person, place, and time.     Cranial Nerves: No cranial nerve deficit.     Deep Tendon Reflexes: Reflexes are normal and symmetric.  Psychiatric:        Behavior: Behavior normal.        Thought Content: Thought content normal.        Judgment: Judgment normal.      BP (!) 153/71   Pulse 66   Temp 98 F (36.7 C) (Temporal)   Wt 192 lb 3.2 oz (87.2 kg)   SpO2 (!) 87%   BMI 28.38 kg/m      Assessment & Plan:  Ronald [name] comes in today with chief complaint of Medical Management of Chronic Issues   Diagnosis and orders addressed:  1. Anxiety - ALPRAZolam (XANAX) 0.5 MG tablet; Take 1 tablet (0.5 mg total) by mouth 2 (two) times daily as needed. for anxiety  Dispense: 60 tablet; Refill: 2 - BMP8+EGFR  2. Benzodiazepine dependence (HCC) - ALPRAZolam (XANAX) 0.5 MG tablet; Take 1 tablet (0.5 mg total) by mouth 2 (two) times daily as needed. for anxiety  Dispense: 60 tablet; Refill: 2 - BMP8+EGFR - [employer] Select 13  (MW), Urine  3. CAD in native artery - BMP8+EGFR  4. Essential hypertension - BMP8+EGFR  5. Aortic atherosclerosis (HCC)  - BMP8+EGFR  6. Congestive heart failure, unspecified HF chronicity, unspecified heart failure type (HCC) - BMP8+EGFR  7. Chronic obstructive pulmonary disease, unspecified COPD type (HCC) - BMP8+EGFR  8. Acute on chronic respiratory failure with hypoxia (HCC) - BMP8+EGFR  9. Gastroesophageal reflux disease, unspecified whether esophagitis present - BMP8+EGFR  10. Type 2 diabetes mellitus with diabetic polyneuropathy, without long-term current use of insulin (HCC)  - BMP8+EGFR  11. Noncompliance  - BMP8+EGFR  12. Moderate episode of recurrent major depressive disorder (HCC) - BMP8+EGFR  13. Lymphedema  - BMP8+EGFR  14. Controlled substance agreement signed - BMP8+EGFR - [employer] Select 13 (MW), Urine  15. Mixed hyperlipidemia  - BMP8+EGFR  16. Cigarette smoker - BMP8+EGFR  17. Diabetes mellitus type 2 in obese ([place])  - BMP8+EGFR   Labs pending Patient reviewed in [place] controlled database, no flags noted. Contract and drug screen are updated today.  Health  Maintenance reviewed Diet and exercise encouraged  Follow up plan: 3 months   [name], FNP

## 2021-01-17 NOTE — Addendum Note (Signed)
Addended by: Brynda Peon F on: 01/17/2021 12:42 PM   Modules accepted: Orders

## 2021-01-18 ENCOUNTER — Ambulatory Visit: Payer: Medicare HMO | Admitting: Family Medicine

## 2021-01-18 DIAGNOSIS — I10 Essential (primary) hypertension: Secondary | ICD-10-CM

## 2021-01-18 DIAGNOSIS — E782 Mixed hyperlipidemia: Secondary | ICD-10-CM

## 2021-01-18 DIAGNOSIS — Z01818 Encounter for other preprocedural examination: Secondary | ICD-10-CM

## 2021-01-18 DIAGNOSIS — Z720 Tobacco use: Secondary | ICD-10-CM

## 2021-01-18 DIAGNOSIS — I2510 Atherosclerotic heart disease of native coronary artery without angina pectoris: Secondary | ICD-10-CM

## 2021-01-18 LAB — BMP8+EGFR
BUN/Creatinine Ratio: 15 (ref 10–24)
BUN: 13 mg/dL (ref 8–27)
CO2: 31 mmol/L — ABNORMAL HIGH (ref 20–29)
Calcium: 9.2 mg/dL (ref 8.6–10.2)
Chloride: 103 mmol/L (ref 96–106)
Creatinine, Ser: 0.88 mg/dL (ref 0.76–1.27)
Glucose: 172 mg/dL — ABNORMAL HIGH (ref 65–99)
Potassium: 4.1 mmol/L (ref 3.5–5.2)
Sodium: 148 mmol/L — ABNORMAL HIGH (ref 134–144)
eGFR: 91 mL/min/{1.73_m2} (ref >59)

## 2021-01-22 ENCOUNTER — Ambulatory Visit: Payer: Medicare HMO | Admitting: Urology

## 2021-01-22 DIAGNOSIS — J440 Chronic obstructive pulmonary disease with acute lower respiratory infection: Secondary | ICD-10-CM | POA: Diagnosis not present

## 2021-01-22 DIAGNOSIS — J398 Other specified diseases of upper respiratory tract: Secondary | ICD-10-CM | POA: Diagnosis not present

## 2021-01-22 DIAGNOSIS — U071 COVID-19: Secondary | ICD-10-CM | POA: Diagnosis not present

## 2021-01-22 DIAGNOSIS — J441 Chronic obstructive pulmonary disease with (acute) exacerbation: Secondary | ICD-10-CM | POA: Diagnosis not present

## 2021-01-22 DIAGNOSIS — I110 Hypertensive heart disease with heart failure: Secondary | ICD-10-CM | POA: Diagnosis not present

## 2021-01-22 DIAGNOSIS — J1282 Pneumonia due to coronavirus disease 2019: Secondary | ICD-10-CM | POA: Diagnosis not present

## 2021-01-22 DIAGNOSIS — J384 Edema of larynx: Secondary | ICD-10-CM | POA: Diagnosis not present

## 2021-01-22 DIAGNOSIS — I509 Heart failure, unspecified: Secondary | ICD-10-CM | POA: Diagnosis not present

## 2021-01-22 DIAGNOSIS — E1165 Type 2 diabetes mellitus with hyperglycemia: Secondary | ICD-10-CM | POA: Diagnosis not present

## 2021-01-22 DIAGNOSIS — I890 Lymphedema, not elsewhere classified: Secondary | ICD-10-CM | POA: Diagnosis not present

## 2021-01-22 DIAGNOSIS — J9621 Acute and chronic respiratory failure with hypoxia: Secondary | ICD-10-CM | POA: Diagnosis not present

## 2021-01-22 DIAGNOSIS — E1140 Type 2 diabetes mellitus with diabetic neuropathy, unspecified: Secondary | ICD-10-CM | POA: Diagnosis not present

## 2021-01-23 DIAGNOSIS — J440 Chronic obstructive pulmonary disease with acute lower respiratory infection: Secondary | ICD-10-CM | POA: Diagnosis not present

## 2021-01-23 DIAGNOSIS — I509 Heart failure, unspecified: Secondary | ICD-10-CM | POA: Diagnosis not present

## 2021-01-23 DIAGNOSIS — J441 Chronic obstructive pulmonary disease with (acute) exacerbation: Secondary | ICD-10-CM | POA: Diagnosis not present

## 2021-01-23 DIAGNOSIS — E1165 Type 2 diabetes mellitus with hyperglycemia: Secondary | ICD-10-CM | POA: Diagnosis not present

## 2021-01-23 DIAGNOSIS — I110 Hypertensive heart disease with heart failure: Secondary | ICD-10-CM | POA: Diagnosis not present

## 2021-01-23 DIAGNOSIS — U071 COVID-19: Secondary | ICD-10-CM | POA: Diagnosis not present

## 2021-01-23 DIAGNOSIS — I890 Lymphedema, not elsewhere classified: Secondary | ICD-10-CM | POA: Diagnosis not present

## 2021-01-23 DIAGNOSIS — E1140 Type 2 diabetes mellitus with diabetic neuropathy, unspecified: Secondary | ICD-10-CM | POA: Diagnosis not present

## 2021-01-23 DIAGNOSIS — J9621 Acute and chronic respiratory failure with hypoxia: Secondary | ICD-10-CM | POA: Diagnosis not present

## 2021-01-23 DIAGNOSIS — J1282 Pneumonia due to coronavirus disease 2019: Secondary | ICD-10-CM | POA: Diagnosis not present

## 2021-01-24 DIAGNOSIS — U071 COVID-19: Secondary | ICD-10-CM | POA: Diagnosis not present

## 2021-01-24 DIAGNOSIS — J441 Chronic obstructive pulmonary disease with (acute) exacerbation: Secondary | ICD-10-CM | POA: Diagnosis not present

## 2021-01-24 DIAGNOSIS — J9621 Acute and chronic respiratory failure with hypoxia: Secondary | ICD-10-CM | POA: Diagnosis not present

## 2021-01-24 DIAGNOSIS — I509 Heart failure, unspecified: Secondary | ICD-10-CM | POA: Diagnosis not present

## 2021-01-24 DIAGNOSIS — J1282 Pneumonia due to coronavirus disease 2019: Secondary | ICD-10-CM | POA: Diagnosis not present

## 2021-01-24 DIAGNOSIS — E1165 Type 2 diabetes mellitus with hyperglycemia: Secondary | ICD-10-CM | POA: Diagnosis not present

## 2021-01-24 DIAGNOSIS — E1140 Type 2 diabetes mellitus with diabetic neuropathy, unspecified: Secondary | ICD-10-CM | POA: Diagnosis not present

## 2021-01-24 DIAGNOSIS — I890 Lymphedema, not elsewhere classified: Secondary | ICD-10-CM | POA: Diagnosis not present

## 2021-01-24 DIAGNOSIS — J440 Chronic obstructive pulmonary disease with acute lower respiratory infection: Secondary | ICD-10-CM | POA: Diagnosis not present

## 2021-01-24 DIAGNOSIS — I110 Hypertensive heart disease with heart failure: Secondary | ICD-10-CM | POA: Diagnosis not present

## 2021-01-25 ENCOUNTER — Ambulatory Visit: Payer: Medicare HMO | Admitting: *Deleted

## 2021-01-25 DIAGNOSIS — J449 Chronic obstructive pulmonary disease, unspecified: Secondary | ICD-10-CM

## 2021-01-25 DIAGNOSIS — Z720 Tobacco use: Secondary | ICD-10-CM

## 2021-01-25 DIAGNOSIS — I10 Essential (primary) hypertension: Secondary | ICD-10-CM

## 2021-01-28 ENCOUNTER — Other Ambulatory Visit: Payer: Self-pay | Admitting: Family Medicine

## 2021-01-28 ENCOUNTER — Encounter: Payer: Self-pay | Admitting: *Deleted

## 2021-01-28 NOTE — Chronic Care Management (AMB) (Signed)
Chronic Care Management   CCM RN Visit Note  01/25/2021 Name: Ronald [name] MRN: [id number] DOB: 1946-04-30  Subjective: Ronald [name] is a 75 y.o. year old male who is a primary care patient of [name], FNP. The care management team was consulted for assistance with disease management and care coordination needs.    Talked with patient's significant other by telephone  for follow up visit in response to provider referral for case management and/or care coordination services.   Consent to Services:  The patient was given information about Chronic Care Management services, agreed to services, and gave verbal consent prior to initiation of services.  Please see initial visit note for detailed documentation.   Patient agreed to services and verbal consent obtained.   Assessment: Review of patient past medical history, allergies, medications, health status, including review of consultants reports, laboratory and other test data, was performed as part of comprehensive evaluation and provision of chronic care management services.   SDOH (Social Determinants of Health) assessments and interventions performed:    CCM Care Plan  Allergies  Allergen Reactions   Gabapentin Anxiety    Unknown reaction   Metformin And Related Rash    Outpatient Encounter Medications as of 01/25/2021  Medication Sig   albuterol (VENTOLIN HFA) 108 (90 Base) MCG/ACT inhaler INHALE 2 PUFFS EVERY 6 HOURS AS NEEEDED   ALPRAZolam (XANAX) 0.5 MG tablet Take 1 tablet (0.5 mg total) by mouth 2 (two) times daily as needed. for anxiety   amLODipine (NORVASC) 5 MG tablet Take 1 tablet (5 mg total) by mouth daily.   Ascorbic Acid (VITAMIN C) 1000 MG tablet Take 1,000 mg by mouth daily.   aspirin EC 81 MG tablet Take 1 tablet (81 mg total) by mouth daily.   atorvastatin (LIPITOR) 40 MG tablet TAKE 1 TABLET BY MOUTH ONCE DAILY IN THE EVENING   Blood Glucose Monitoring Suppl (ACCU-CHEK GUIDE ME) w/Device KIT Check BS  daily Dx E11.9   Budeson-Glycopyrrol-Formoterol (BREZTRI AEROSPHERE) 160-9-4.8 MCG/ACT AERO Inhale 2 puffs into the lungs in the morning and at bedtime.   cholecalciferol (VITAMIN D3) 25 MCG (1000 UNIT) tablet Take 1,000 Units by mouth daily.   doxycycline (VIBRA-TABS) 100 MG tablet Take 1 tablet (100 mg total) by mouth 2 (two) times daily. (Patient not taking: Reported on 01/17/2021)   escitalopram (LEXAPRO) 20 MG tablet Take 1 tablet by mouth once daily   fluticasone (FLONASE) 50 MCG/ACT nasal spray Place 2 sprays into both nostrils daily.   furosemide (LASIX) 20 MG tablet Take 1 tablet (20 mg total) by mouth 2 (two) times daily.   glucose blood (ONE TOUCH ULTRA TEST) test strip USE TO CHECK GLUCOSE ONCE DAILY   guaiFENesin (REFENESEN 400 PO) Take 2 tablets by mouth daily as needed (mucus).   ipratropium-albuterol (DUONEB) 0.5-2.5 (3) MG/3ML SOLN USE ONE VIAL BY NEBULIZER FOUR TIMES DAILY   isosorbide mononitrate (IMDUR) 30 MG 24 hr tablet Take 1 tablet (30 mg total) by mouth daily.   Lancets (ONETOUCH ULTRASOFT) lancets Use as instructed   DX E11.9   losartan (COZAAR) 100 MG tablet Take 100 mg by mouth daily.   metoprolol tartrate (LOPRESSOR) 50 MG tablet Take 1 tablet (50 mg total) by mouth 2 (two) times daily.   mirtazapine (REMERON) 15 MG tablet Take 1 tablet (15 mg total) by mouth at bedtime.   nicotine (NICODERM CQ - DOSED IN MG/24 HOURS) 14 mg/24hr patch Place 1 patch (14 mg total) onto the skin daily. (  Patient not taking: Reported on 01/17/2021)   nitroGLYCERIN (NITROSTAT) 0.4 MG SL tablet DISSOLVE TAKE 1 TABLET UNDER THE TONGUE EVERY FIVE MINUTES AS NEEDED FOR CHEST PAIN (Patient not taking: Reported on 01/17/2021)   OXYGEN Inhale 3 L into the lungs daily. 24 hours a day   pantoprazole (PROTONIX) 40 MG tablet Take 1 tablet by mouth once daily   No facility-administered encounter medications on file as of 01/25/2021.    Patient Active Problem List   Diagnosis Date Noted   Elevated  brain natriuretic peptide (BNP) level 12/21/2020   Elevated troponin 12/21/2020   Hypokalemia 12/21/2020   Hyperglycemia due to diabetes mellitus ([place]) 12/21/2020   Hyperlipidemia 12/21/2020   Acute on chronic respiratory failure with hypoxia ([place]) 12/21/2020   Chest pain 05/23/2020   Controlled substance agreement signed 04/03/2020   Benzodiazepine dependence ([place]) 04/03/2020   Dysphagia 02/27/2020   Tracheal stenosis 12/09/2019   Noncompliance 04/01/2019   Lymphedema 04/01/2019   CHF (congestive heart failure) ([place]) 08/27/2018   Aortic atherosclerosis ([place]) 05/11/2017   Depression 05/05/2017   GERD (gastroesophageal reflux disease) 01/16/2017   Diabetic neuropathy ([place]) 01/16/2017   Anterolisthesis 12/04/2016   History of MI (myocardial infarction) 12/04/2016   OSA (obstructive sleep apnea) 12/04/2016   Closed fracture dislocation of lumbar spine ([place]) 07/25/2016   Closed fracture of body of sternum 07/25/2016   Multiple fractures of cervical spine ([place]) 07/25/2016   Tachycardia 07/25/2016   Tachypnea 07/25/2016   Anxiety 02/14/2016   Chronic respiratory failure with hypoxia ([place]) 07/19/2014   Essential hypertension 07/19/2014   Cigarette smoker 07/19/2014   Chest pain at rest 06/22/2014   CAD in native artery 06/22/2014   Diabetes mellitus ([place]) 06/22/2014   COPD (chronic obstructive pulmonary disease) ([place]) 06/22/2014   Tobacco abuse 06/22/2014   Acute encephalopathy 06/22/2014   Diabetes mellitus type 2 in obese ([place]) 05/11/2014   Simple chronic bronchitis ([place]) 05/11/2014    Conditions to be addressed/monitored:COPD, HTN, nicotine dependence, and incontinence   Care Plan : [facility] Care Plan   Problem: Chronic Disease Management Needs   Priority: Medium  Onset Date: 01/09/2021     Goal: Care Management and Care Coordination Needs Associated with COPD, nicotine dependence, DM, and HTN   Start Date: 01/09/2021  This Visit's Progress: On track  Recent Progress: Not on  track  Priority: Medium  Note:   Current Barriers:  Chronic Disease Management support and education needs related to HTN, COPD, and DMII Non-adherence to smoking cessation recommendation  Non-adherence to prescribed oxygen treatment  RNCM Clinical Goal(s):  Patient will work with RN Case Manager to address needs related to HTN, COPD, and DMII and Limited social support and nicotine dependence  take all medications exactly as prescribed and will call provider for medication related questions through collaboration with RN Care manager, provider, and care team.   Interventions: 1:1 collaboration with primary care provider regarding development and update of comprehensive plan of care as evidenced by provider attestation and co-signature Inter-disciplinary care team collaboration (see longitudinal plan of care) Evaluation of current treatment plan related to  self management and patient's adherence to plan as established by provider; Provided with RN Care Manager contact number and encouraged to reach out as needed   COPD: (Status: Goal on track: YES.) Provided patient with basic written and verbal COPD education on self care/management/and exacerbation prevention; Advised patient to track and manage COPD triggers;  Provided instruction about proper use of medications used for management of COPD including inhalers;  Provided education about and advised patient to utilize infection prevention strategies to reduce risk of respiratory infection; Discussed the importance of adequate rest and management of fatigue with COPD; Assessed social determinant of health barriers;  Reviewed and discussed medications and affordability. Receiving Trelegy samples.  Chart reviewed including recent office notes, hosp notes, imaging reports, and lab results Discussed family/social support. Patient performs ADLs as able and wife provides assistance as needed. Son drives to appointments outside of local area. Does  have a sister that calls and talks with him often.  Advised that 6 month follow-up with Dr Melvyn Novas is overdue. Reinforced need to schedule this appointment.  Reviewed upcoming cardiology appointment. Verified transportation.  Discussed use of oxygen. Uses 2 liters via [place] continuosly inside the house using a floor concentrator. Has tanks for when leaving the house but they are too large for him to manage. PCP prescribed portable oxygen concentrator but patient does not qualify per [employer] because he does not use the portable tanks often enough. Patient chooses to either stay home or venture out with oxygen. Encouraged to use continuously. Discussed current status of respiratory conditions and patient seems to be improving slowly Advised to reach out to PCP with any new or worsening symptoms or to call 911 for any severe symptoms Encouraged to monitor and record oxygen level with pulse ox and to call PCP or pulmonologist with any readings below 90% Discussed mobility and ability to perform ADLs. Limited by shortness of breath. Advised that home health physical therapy has been ordered to help with strengthening. May need to consider pulmonary rehab. Can talk with pulmonologist or PCP regarding this.    Urinary Incontinence:  (Status: New goal.) Discussed current management of incontinence Mainly nocturia and some urge incontinence Wears adult diapers at night and has chux pads on the bed as well as a mattress cover He is soaking through these Reviewed upcoming appointment with urologist in [place] and verified transportation.  Recommended condom catheter in addition to current supplies Discussed with wife and explained how a condom catheter works Provided with pictures of different types of catheters Recommended that she talk with a DME supplier about cost. Prescription likely not necessary but can be provided it needed.  Recommended to empty bladder at least every 4 hours and to limit liquid  intake several hours before bed Encouraged to reach out to [facility] as needed   Smoking Cessation: (Status: Goal on track: NO.) Reviewed smoking history:  currently smoking 1.5 ppd Reports smoking within 30 minutes of waking up Wife would like for him to stop smoking but he is not motivated at this time He does have nicotine replacement options at home but doesn't use them. Used patches in the hospital.  Verified that he does not smoke near to or while wearing oxygen  Evaluation of current treatment plan reviewed; Advised patient to discuss smoking cessation options with provider; Provided contact information for  Quit Line (1-800-QUIT-NOW); Reviewed smoking cessation techniques: removing cigarettes and smoking materials from environment, stress management, substitution of other forms of reinforcement, and support of family/friends  Patient Goals/Self-Care Activities: Patient will self administer medications as prescribed Patient will attend all scheduled provider appointments Patient will call provider office for new concerns or questions       Plan:Telephone follow up appointment with care management team member scheduled for:  02/14/21 with RNCM and The patient has been provided with contact information for the care management team and has been advised to call with any health related  questions or concerns.   Chong Sicilian, BSN, RN-BC Embedded Chronic Care Manager [facility] [place] Management Direct Dial: [phone]

## 2021-01-28 NOTE — Patient Instructions (Signed)
Visit Information  PATIENT GOALS:  Goals Addressed             This Visit's Progress    Manage Incontinence   Not on track    Timeframe:  Long-Range Goal Priority:  Medium Start Date:     01/25/21                        Expected End Date:                       Follow-up: 02/14/21  Keep appt with urologist in [place] Call PCP with any new or worsening symptoms in the meantime Empty bladder at least every 4 hours Limit liquids several hours prior to bedtime Try a condom catheter  Call RN Care Manager as needed [phone]      Stop Smoking   Not on track    Timeframe:  Short-Term Goal Priority:  High Start Date:    01/09/21                         Expected End Date:  05/11/21                      Follow-up: 02/19/21  Use nicotine replacement like gum or patches Talk with PCP about other options to aid in smoking cessation Remove cigarettes from home Continue to not smoke inside the home 1-800-QUIT      Track and Manage My Symptoms-COPD   On track    Timeframe:  Long-Range Goal Priority:  Medium Start Date:    01/09/21                         Expected End Date:  01/09/22                      Follow Up Date 02/14/21   Take all medications as prescribed Call PCP with any new or worsening symptoms and seek appropriate medical care as needed Use 3L of oxygen continuously. Take portable tanks when leaving the house.  Check and record oxygen level daily and as needed Call PCP with any readings below 90% Keep all medical appointments Call RN Care Manager as needed [phone] Schedule 6 month follow-up with pulmonologist, Dr Melvyn Novas Stop smoking. Consider using nicotine replacement such as patches or gum to help.   Why is this important?   Tracking your symptoms and other information about your health helps your doctor plan your care.  Write down the symptoms, the time of day, what you were doing and what medicine you are taking.  You will soon learn how to manage your  symptoms.     Notes:         Patient verbalizes understanding of instructions provided today and agrees to view in [place].   Plan:Telephone follow up appointment with care management team member scheduled for:  02/14/21 with RNCM and The patient has been provided with contact information for the care management team and has been advised to call with any health related questions or concerns.   Chong Sicilian, BSN, RN-BC Embedded Chronic Care Manager [facility] [place] Management Direct Dial: [phone]

## 2021-01-29 DIAGNOSIS — J9621 Acute and chronic respiratory failure with hypoxia: Secondary | ICD-10-CM | POA: Diagnosis not present

## 2021-01-29 DIAGNOSIS — J441 Chronic obstructive pulmonary disease with (acute) exacerbation: Secondary | ICD-10-CM | POA: Diagnosis not present

## 2021-01-29 DIAGNOSIS — E1140 Type 2 diabetes mellitus with diabetic neuropathy, unspecified: Secondary | ICD-10-CM | POA: Diagnosis not present

## 2021-01-29 DIAGNOSIS — I509 Heart failure, unspecified: Secondary | ICD-10-CM | POA: Diagnosis not present

## 2021-01-29 DIAGNOSIS — E1165 Type 2 diabetes mellitus with hyperglycemia: Secondary | ICD-10-CM | POA: Diagnosis not present

## 2021-01-29 DIAGNOSIS — I890 Lymphedema, not elsewhere classified: Secondary | ICD-10-CM | POA: Diagnosis not present

## 2021-01-29 DIAGNOSIS — U071 COVID-19: Secondary | ICD-10-CM | POA: Diagnosis not present

## 2021-01-29 DIAGNOSIS — J440 Chronic obstructive pulmonary disease with acute lower respiratory infection: Secondary | ICD-10-CM | POA: Diagnosis not present

## 2021-01-29 DIAGNOSIS — J1282 Pneumonia due to coronavirus disease 2019: Secondary | ICD-10-CM | POA: Diagnosis not present

## 2021-01-29 DIAGNOSIS — I110 Hypertensive heart disease with heart failure: Secondary | ICD-10-CM | POA: Diagnosis not present

## 2021-01-31 DIAGNOSIS — I509 Heart failure, unspecified: Secondary | ICD-10-CM | POA: Diagnosis not present

## 2021-01-31 DIAGNOSIS — I110 Hypertensive heart disease with heart failure: Secondary | ICD-10-CM | POA: Diagnosis not present

## 2021-01-31 DIAGNOSIS — U071 COVID-19: Secondary | ICD-10-CM | POA: Diagnosis not present

## 2021-01-31 DIAGNOSIS — E1165 Type 2 diabetes mellitus with hyperglycemia: Secondary | ICD-10-CM | POA: Diagnosis not present

## 2021-01-31 DIAGNOSIS — E1140 Type 2 diabetes mellitus with diabetic neuropathy, unspecified: Secondary | ICD-10-CM | POA: Diagnosis not present

## 2021-01-31 DIAGNOSIS — J440 Chronic obstructive pulmonary disease with acute lower respiratory infection: Secondary | ICD-10-CM | POA: Diagnosis not present

## 2021-01-31 DIAGNOSIS — J441 Chronic obstructive pulmonary disease with (acute) exacerbation: Secondary | ICD-10-CM | POA: Diagnosis not present

## 2021-01-31 DIAGNOSIS — I890 Lymphedema, not elsewhere classified: Secondary | ICD-10-CM | POA: Diagnosis not present

## 2021-01-31 DIAGNOSIS — J9621 Acute and chronic respiratory failure with hypoxia: Secondary | ICD-10-CM | POA: Diagnosis not present

## 2021-01-31 DIAGNOSIS — J1282 Pneumonia due to coronavirus disease 2019: Secondary | ICD-10-CM | POA: Diagnosis not present

## 2021-02-03 DIAGNOSIS — J439 Emphysema, unspecified: Secondary | ICD-10-CM | POA: Diagnosis not present

## 2021-02-04 ENCOUNTER — Other Ambulatory Visit: Payer: Self-pay | Admitting: *Deleted

## 2021-02-04 DIAGNOSIS — I152 Hypertension secondary to endocrine disorders: Secondary | ICD-10-CM

## 2021-02-04 MED ORDER — AMLODIPINE BESYLATE 5 MG PO TABS: 5.0000 mg | ORAL_TABLET | Freq: Every day | ORAL | 0 refills | Status: DC

## 2021-02-05 LAB — DRUG SCREEN 10 W/CONF, SERUM
Amphetamines, IA: NEGATIVE ng/mL
Barbiturates, IA: NEGATIVE ug/mL
Benzodiazepines, IA: POSITIVE ng/mL — AB
Cocaine & Metabolite, IA: NEGATIVE ng/mL
Methadone, IA: NEGATIVE ng/mL
Opiates, IA: NEGATIVE ng/mL
Oxycodones, IA: NEGATIVE ng/mL
Phencyclidine, IA: NEGATIVE ng/mL
Propoxyphene, IA: NEGATIVE ng/mL
THC(Marijuana) Metabolite, IA: NEGATIVE ng/mL

## 2021-02-05 LAB — BENZODIAZEPINES,MS,WB/SP RFX
7-Aminoclonazepam: NEGATIVE ng/mL
Alprazolam: 17.1 ng/mL
Benzodiazepines Confirm: POSITIVE
Chlordiazepoxide: NEGATIVE
Clonazepam: NEGATIVE ng/mL
Desalkylflurazepam: NEGATIVE ng/mL
Desmethylchlordiazepoxide: NEGATIVE
Desmethyldiazepam: NEGATIVE ng/mL
Diazepam: NEGATIVE ng/mL
Flurazepam: NEGATIVE ng/mL
Lorazepam: NEGATIVE ng/mL
Midazolam: NEGATIVE ng/mL
Oxazepam: NEGATIVE ng/mL
Temazepam: NEGATIVE ng/mL
Triazolam: NEGATIVE ng/mL

## 2021-02-05 MED ORDER — LOSARTAN POTASSIUM 100 MG PO TABS: 100.0000 mg | ORAL_TABLET | Freq: Every day | ORAL | 1 refills | Status: DC

## 2021-02-06 DIAGNOSIS — J1282 Pneumonia due to coronavirus disease 2019: Secondary | ICD-10-CM | POA: Diagnosis not present

## 2021-02-06 DIAGNOSIS — I509 Heart failure, unspecified: Secondary | ICD-10-CM | POA: Diagnosis not present

## 2021-02-06 DIAGNOSIS — I890 Lymphedema, not elsewhere classified: Secondary | ICD-10-CM | POA: Diagnosis not present

## 2021-02-06 DIAGNOSIS — J449 Chronic obstructive pulmonary disease, unspecified: Secondary | ICD-10-CM

## 2021-02-06 DIAGNOSIS — E1165 Type 2 diabetes mellitus with hyperglycemia: Secondary | ICD-10-CM | POA: Diagnosis not present

## 2021-02-06 DIAGNOSIS — J441 Chronic obstructive pulmonary disease with (acute) exacerbation: Secondary | ICD-10-CM | POA: Diagnosis not present

## 2021-02-06 DIAGNOSIS — U071 COVID-19: Secondary | ICD-10-CM | POA: Diagnosis not present

## 2021-02-06 DIAGNOSIS — J9621 Acute and chronic respiratory failure with hypoxia: Secondary | ICD-10-CM | POA: Diagnosis not present

## 2021-02-06 DIAGNOSIS — E1140 Type 2 diabetes mellitus with diabetic neuropathy, unspecified: Secondary | ICD-10-CM | POA: Diagnosis not present

## 2021-02-06 DIAGNOSIS — I10 Essential (primary) hypertension: Secondary | ICD-10-CM | POA: Diagnosis not present

## 2021-02-06 DIAGNOSIS — J440 Chronic obstructive pulmonary disease with acute lower respiratory infection: Secondary | ICD-10-CM | POA: Diagnosis not present

## 2021-02-06 DIAGNOSIS — I110 Hypertensive heart disease with heart failure: Secondary | ICD-10-CM | POA: Diagnosis not present

## 2021-02-06 NOTE — Progress Notes (Signed)
Cardiology Office Note  Date: 02/07/2021   ID: REIN POPOV, DOB Sep 01, 1945, MRN [id number]  PCP:  [name], FNP  Cardiologist:  Carlyle Dolly, MD Electrophysiologist:  None   Chief Complaint: 6 month follow up  History of Present Illness: Ronald [name] is a 75 y.o. male with a history of CAD (DES to LAD x2 NSTEMI  December 2015), HTN, HLD, COPD, CVA/TIA, UC, GERD.,  DM 2, history of tobacco abuse.  He was seen by me on 05/31/2020 for clearance for Upper endoscopy and colonoscopy. He underwent both procedures without incident on 07/20/2020.  Upper endoscopy showed no endoscopic esophageal abnormality to explain the patient's dysphagia.  Esophagus was dilated. Colonoscopy found polyps and diverticulosis.    He had a recent admission December 21, 2020.  He presented with shortness of breath cough, sputum production.  His breathing had worsened over the prior 24 hours.  His PCR was positive for COVID-19.  He was admitted and treated with remdesivir and IV steroids.   Recently had follow-up with ENT specialist on 01/22/2021 at [place] on 01/22/2021.  He underwent a transnasal flexible laryngoscopy.  Impression was continued tracheal stenosis status post decannulation.  He was breathing comfortably on exam without stridor.  He had improved but continued narrowing approximately 50%.  ENT specialist suspected difficulty breathing may be multifactorial with contribution more so from longer normal heart disease.  He is here for 56-monthfollow-up today.  He continues with chronic dyspnea.  He does have significant history of smoking and continues to smoke with COPD.  Blood pressure is elevated today on arrival at 180/88.  He states he has not taken his a.m. medications today.  He denies any anginal symptoms, orthostatic symptoms, CVA or TIA-like symptoms, PND, orthopnea.  Denies any claudication-like symptoms, DVT or PE-like symptoms, or lower extremity edema.  He states  he has not seen a pulmonary doctor that he is aware of ever.  We discussed sending him to pulmonary specialist for his COPD.  He mentions he has some swelling in his arms and some areas of the plaque on his elbows and is worried about what this may be.  I advised him to speak with his primary care provider this could be psoriasis or psoriatic arthritis.  He states he is compliant with his medications.  Cardiac regimen includes; amlodipine milligrams daily, aspirin 81 mg daily, atorvastatin 40 mg daily, Lasix 20 mg p.o. twice daily, Imdur 30 mg daily.  Losartan 100 mg daily, metoprolol 50 mg daily.  He states he is on both breast tree and trilogy inhalers along with albuterol inhalers for his lung disease.  He also has duo nebs at home.  He continues to use his oxygen at night.   Past Medical History:  Diagnosis Date   Anxiety    Asthma    Chronic lower back pain    COPD (chronic obstructive pulmonary disease) (HCC)    Coronary artery disease    a. [facility]   Depression    Educated about COVID-19 virus infection 03/06/2020   GERD (gastroesophageal reflux disease)    High cholesterol    Hypertension    [place]   with stent [place]   Sleep apnea    [place]   anyeusym    TIA (transient ischemic attack)    "they say I've had some mini strokes; don't know when"; denies residual  on 06/22/2014)   Type II diabetes mellitus ([place])    Ulcerative colitis [facility])     Past Surgical History:  Procedure Laterality Date   APPENDECTOMY     BIOPSY  07/20/2020   Procedure: BIOPSY;  Surgeon: Harvel Quale, MD;  [facility];  Service: Gastroenterology;;   CARDIAC CATHETERIZATION  [phone] X 3   CHOLECYSTECTOMY     COLONOSCOPY WITH PROPOFOL N/A 07/20/2020   Procedure: COLONOSCOPY WITH PROPOFOL;  Surgeon: Harvel Quale, MD;  Location[facility];  Service: Gastroenterology;   Laterality: N/A;  1:15   CORONARY ANGIOPLASTY WITH STENT PLACEMENT  05/2014   "2"   ESOPHAGEAL DILATION N/A 07/20/2020   Procedure: ESOPHAGEAL DILATION;  Surgeon: Harvel Quale, MD;  [facility];  Service: Gastroenterology;  Laterality: N/A;   ESOPHAGOGASTRODUODENOSCOPY (EGD) WITH PROPOFOL N/A 07/20/2020   Procedure: ESOPHAGOGASTRODUODENOSCOPY (EGD) WITH PROPOFOL;  Surgeon: Harvel Quale, MD;  [facility];  Service: Gastroenterology;  Laterality: N/A;   LEFT HEART CATH AND CORONARY ANGIOGRAPHY N/A 05/25/2020   Procedure: LEFT HEART CATH AND CORONARY ANGIOGRAPHY;  Surgeon: Martinique, Peter M, MD;  Location: [place] CV LAB;  Service: Cardiovascular;  Laterality: N/A;   POLYPECTOMY  07/20/2020   Procedure: POLYPECTOMY INTESTINAL;  Surgeon: Montez Morita, Quillian Quince, MD;  [facility];  Service: Gastroenterology;;   TUMOR EXCISION Right ~ 1999   "side of my upper head"    Current Outpatient Medications  Medication Sig Dispense Refill   albuterol (VENTOLIN HFA) 108 (90 Base) MCG/ACT inhaler INHALE 2 PUFFS EVERY 6 HOURS AS NEEEDED 6.7 g 0   ALPRAZolam (XANAX) 0.5 MG tablet Take 1 tablet (0.5 mg total) by mouth 2 (two) times daily as needed. for anxiety 60 tablet 2   amLODipine (NORVASC) 5 MG tablet Take 1 tablet (5 mg total) by mouth daily. 90 tablet 0   Ascorbic Acid (VITAMIN C) 1000 MG tablet Take 1,000 mg by mouth daily.     aspirin EC 81 MG tablet Take 1 tablet (81 mg total) by mouth daily.     atorvastatin (LIPITOR) 40 MG tablet TAKE 1 TABLET BY MOUTH ONCE DAILY IN THE EVENING 90 tablet 0   Blood Glucose Monitoring Suppl (ACCU-CHEK GUIDE ME) w/Device KIT Check BS daily Dx E11.9 1 kit 0   Budeson-Glycopyrrol-Formoterol (BREZTRI AEROSPHERE) 160-9-4.8 MCG/ACT AERO Inhale 2 puffs into the lungs in the morning and at bedtime. 10.7 g 0   cholecalciferol (VITAMIN D3) 25 MCG (1000 UNIT) tablet Take 1,000 Units by mouth daily.     escitalopram  (LEXAPRO) 20 MG tablet Take 1 tablet by mouth once daily 150 tablet 0   fluticasone (FLONASE) 50 MCG/ACT nasal spray Place 2 sprays into both nostrils daily. 16 g 6   furosemide (LASIX) 20 MG tablet Take 1 tablet (20 mg total) by mouth 2 (two) times daily. 60 tablet 2   glucose blood (ONE TOUCH ULTRA TEST) test strip USE TO CHECK GLUCOSE ONCE DAILY 100 each 3   guaiFENesin (REFENESEN 400 PO) Take 2 tablets by mouth daily as needed (mucus).     ipratropium-albuterol (DUONEB) 0.5-2.5 (3) MG/3ML SOLN USE ONE VIAL BY NEBULIZER FOUR TIMES DAILY 360 mL 3   isosorbide mononitrate (IMDUR) 30 MG 24 hr tablet TAKE 1 TABLET BY MOUTH ONCE DAILY. NEEDS FOLLOW UP APPOINTMENT 30 tablet 1   Lancets (ONETOUCH ULTRASOFT) lancets Use as instructed   DX E11.9 100 each 12   losartan (COZAAR) 100 MG tablet Take  1 tablet (100 mg total) by mouth daily. 90 tablet 1   metoprolol tartrate (LOPRESSOR) 50 MG tablet Take 1 tablet (50 mg total) by mouth 2 (two) times daily. 180 tablet 1   mirtazapine (REMERON) 15 MG tablet Take 1 tablet (15 mg total) by mouth at bedtime. 90 tablet 2   nicotine (NICODERM CQ - DOSED IN MG/24 HOURS) 14 mg/24hr patch Place 1 patch (14 mg total) onto the skin daily. 90 patch 1   OXYGEN Inhale 3 L into the lungs daily. 24 hours a day     pantoprazole (PROTONIX) 40 MG tablet Take 1 tablet by mouth once daily 90 tablet 0   nitroGLYCERIN (NITROSTAT) 0.4 MG SL tablet Place 1 tablet (0.4 mg total) under the tongue every 5 (five) minutes x 3 doses as needed for chest pain (if no relief after 2nd dose, proceed to the ED for an evaluation or call 911). 25 tablet 3   No current facility-administered medications for this visit.   Allergies:  Gabapentin and Metformin and related   Social History: The patient  reports that he has been smoking cigarettes. He started smoking about 55 years ago. He has a 72.00 pack-year smoking history. He has never used smokeless tobacco. He reports that he does not currently use  alcohol. He reports that he does not use drugs.   Family History: The patient's family history includes CAD in his father; Cancer in his brother and brother; Dementia in his sister; Emphysema in his sister; Leukemia in his sister; Lung cancer in his brother; Stroke in his mother.   ROS:  Please see the history of present illness. Otherwise, complete review of systems is positive for none.  All other systems are reviewed and negative.   Physical Exam: VS:  BP (!) 180/88   Pulse 70   Ht _0  (1.753 m)   Wt 196 lb 3.2 oz (89 kg)   SpO2 90%   BMI 28.97 kg/m , BMI Body mass index is 28.97 kg/m.  Wt Readings from Last 3 Encounters:  02/07/21 196 lb 3.2 oz (89 kg)  01/17/21 192 lb 3.2 oz (87.2 kg)  01/15/21 190 lb (86.2 kg)    General: Patient appears comfortable at rest. Neck: Supple, no elevated JVP or carotid bruits, no thyromegaly. Lungs: Prolonged expiratory phase bilaterally, nonlabored breathing at rest. Cardiac: Regular rate and rhythm, no S3 or significant systolic murmur, no pericardial rub. Extremities: No pitting edema, distal pulses 2+. Skin: Warm and dry.  Right radial access site clean and dry with good pulses.  No signs of redness, infection, or bruising Musculoskeletal: No kyphosis. Neuropsychiatric: Alert and oriented x3, affect grossly appropriate.  ECG:  EKG May 16, 2020.  Normal sinus rhythm rate of 63, LVH with repolarization abnormality  Recent Labwork: 05/25/2020: TSH 1.097 12/21/2020: B Natriuretic Peptide 364.0 12/23/2020: Magnesium 2.2 01/01/2021: ALT 14; AST 14; Hemoglobin 15.2; Platelets 281 01/17/2021: BUN 13; Creatinine, Ser 0.88; Potassium 4.1; Sodium 148     Component Value Date/Time   CHOL 100 05/25/2020 0453   CHOL 120 12/15/2019 1331   TRIG 143 05/25/2020 0453   HDL 26 (L) 05/25/2020 0453   HDL 28 (L) 12/15/2019 1331   CHOLHDL 3.8 05/25/2020 0453   VLDL 29 05/25/2020 0453   LDLCALC 45 05/25/2020 0453   LDLCALC 63 12/15/2019 1331     Other Studies Reviewed Today:  Pulmonary function test 04/04/2020 The FVC, FEV1, FEV1/FVC ratio and FEF25-75% are reduced indicating airway obstruction. The slow vital capacity  is reduced. Following administration of bronchodilators, there is no significant response. The reduced diffusing capacity indicates a severe loss of functional alveolar capillary surface. However, the diffusing capacity was not corrected for the patient's hemoglobin. Pulmonary Function Diagnosis: Severe Obstructive Airways Disease   Cardiac catheterization 05/25/2020 LEFT HEART CATH AND CORONARY ANGIOGRAPHY    Previously placed Mid LAD stent (unknown type) is widely patent. Mid LAD to Dist LAD lesion is 50% stenosed. Dist LAD lesion is 50% stenosed. Prox RCA lesion is 60% stenosed. Mid RCA lesion is 50% stenosed. RV Branch lesion is 80% stenosed. LV end diastolic pressure is mildly elevated.   1. Modest 2 vessel CAD. The stent in the LAD is widely patent.     50% stenosis in the mid and distal LAD    60% proximal and 50% mid RCA. 80% diffuse disease in the RV marginal branch 2. LVEDP 16 mm Hg   Plan: would recommend medical therapy. No culprit lesion for his chest pain identified.    Diagnostic Dominance: Right          Echocardiogram 05/24/2020 1. Left ventricular ejection fraction, by estimation, is 65 to 70%. The left ventricle has normal function. The left ventricle has no regional wall motion abnormalities. There is mild left ventricular hypertrophy. Left ventricular diastolic parameters are indeterminate. 2. Right ventricular systolic function is normal. The right ventricular size is normal. Tricuspid regurgitation signal is inadequate for assessing PA pressure. 3. The mitral valve is grossly normal. Mild mitral valve regurgitation. 4. The aortic valve was not well visualized. Aortic valve regurgitation is not visualized. 5. The inferior vena cava is normal in size with greater than  50% respiratory variability, suggesting right atrial pressure of 3 mmHg.  EGD 07/20/2020 - No endoscopic esophageal abnormality to explain patient's dysphagia. Esophagus dilated. Dilated. - White nummular lesions in esophageal mucosa. Biopsied. - Mucosal nodule found in the esophagus. Biopsied. - 3 cm hiatal hernia with a few Cameron ulcers. - Gastritis. - Normal examined duodenum.   Colonoscopy 07/20/2020 - Perianal skin tags found on perianal exam. - The examined portion of the ileum was normal. - Inactive (Mayo Score 0) ulcerative colitis. Biopsied. - Three 2 to 3 mm polyps in the cecum, removed with a cold biopsy forceps. Resected and retrieved. - Five 3 to 5 mm polyps in the transverse colon, removed with a cold snare. Resected and retrieved. - One 1 mm polyp in the rectum, removed with a cold snare. Resected and retrieved. - Diverticulosis in the sigmoid colon. - Non-bleeding internal hemorrhoids.   Assessment and Plan:  1. CAD in native artery/chest pain On 05/23/2020 patient presented to [facility] ED with chest pain.  He was eventually transferred to [facility] and had a Cardiac catheterization: Revealed previously placed mid LAD stent widely patent.  Mid LAD distal LAD 50%, distal LAD 50%, proximal RCA 60%, mid RCA 50%, RV branch lesion 80%.  No culprit lesion for chest pain identified.  He denies any anginal symptoms.  He has chronic dyspnea on exertion from COPD.Marland Kitchen  Continue aspirin 81 mg daily.  Continue Imdur 30 mg daily.  Continue nitroglycerin 0.4 mg as needed.  Continue metoprolol 50 mg p.o. twice daily.  He states his nitroglycerin is out of date.  Please refill sublingual nitroglycerin  2. Essential hypertension Blood pressure elevated today at 180/88.  Patient states he has not taken his antihypertensive meds today.  Advised him to take them early in the morning after arising.  Continue amlodipine 5 mg  daily. Continue losartan 100 mg daily.  Continue metoprolol  tartrate 50 mg p.o. twice daily.  Continue furosemide 20 mg p.o. twice daily.  3. Mixed hyperlipidemia Continue atorvastatin 40 mg p.o. daily.  Lipid panel 05/25/2020: TC 100, TG 143, HDL 26, LDL 45.  4. COPD / Tobacco abuse Significant history of smoking.  Has a 72-pack-year history of smoking.  Continues to smoke about a half a pack of cigarettes per day.  Recent PFTs April 04, 2020 revealing severe obstructive airways disease.  Today he states he has cut way down on smoking.  He continues with chronic dyspnea.  He probably needs to be referred back to pulmonary to see if there are any treatment options.  Please [facility] [place].  Medication Adjustments/Labs and Tests Ordered: Current medicines are reviewed at length with the patient today.  Concerns regarding medicines are outlined above.   Disposition: Follow-up with Dr. Harl Bowie or APP 6 months Signed, Levell July, NP 02/07/2021 10:59 AM    [facility] at [place], [place], [place] [address] Phone: [phone]; Fax: [phone]

## 2021-02-07 ENCOUNTER — Other Ambulatory Visit: Payer: Self-pay

## 2021-02-07 ENCOUNTER — Encounter: Payer: Self-pay | Admitting: Family Medicine

## 2021-02-07 ENCOUNTER — Ambulatory Visit (INDEPENDENT_AMBULATORY_CARE_PROVIDER_SITE_OTHER): Payer: Medicare HMO | Admitting: Family Medicine

## 2021-02-07 VITALS — BP 180/88 | HR 70 | Ht 69.0 in | Wt 196.2 lb

## 2021-02-07 DIAGNOSIS — J449 Chronic obstructive pulmonary disease, unspecified: Secondary | ICD-10-CM

## 2021-02-07 DIAGNOSIS — I2510 Atherosclerotic heart disease of native coronary artery without angina pectoris: Secondary | ICD-10-CM | POA: Diagnosis not present

## 2021-02-07 DIAGNOSIS — I10 Essential (primary) hypertension: Secondary | ICD-10-CM

## 2021-02-07 DIAGNOSIS — Z720 Tobacco use: Secondary | ICD-10-CM

## 2021-02-07 MED ORDER — NITROGLYCERIN 0.4 MG SL SUBL: 0.4000 mg | SUBLINGUAL_TABLET | SUBLINGUAL | 3 refills | Status: DC | PRN

## 2021-02-07 NOTE — Patient Instructions (Addendum)
Medication Instructions:  Your physician recommends that you continue on your current medications as directed. Please refer to the Current Medication list given to you today.  Labwork: none  Testing/Procedures: none  Follow-Up: Your physician recommends that you schedule a follow-up appointment in: 6 months  Any Other Special Instructions Will Be Listed Below (If Applicable). You have been referred to [facility]  If you need a refill on your cardiac medications before your next appointment, please call your pharmacy.

## 2021-02-08 ENCOUNTER — Telehealth: Payer: Self-pay | Admitting: Cardiology

## 2021-02-08 DIAGNOSIS — H2512 Age-related nuclear cataract, left eye: Secondary | ICD-10-CM | POA: Diagnosis not present

## 2021-02-08 DIAGNOSIS — H2513 Age-related nuclear cataract, bilateral: Secondary | ICD-10-CM | POA: Diagnosis not present

## 2021-02-08 NOTE — Telephone Encounter (Signed)
   [facility] Pre-operative Risk Assessment    Patient Name: Ronald [name]  DOB: [birth year] MRN: [id number]  HEARTCARE STAFF:  - IMPORTANT!!!!!! Under Visit Info/Reason for Call, type in Other and utilize the format Clearance MM/DD/YY or Clearance TBD. Do not use dashes or single digits. - Please review there is not already an duplicate clearance open for this procedure. - If request is for dental extraction, please clarify the # of teeth to be extracted. - If the patient is currently at the dentist's office, call Pre-Op Callback Staff (MA/nurse) to input urgent request.  - If the patient is not currently in the dentist office, please route to the Pre-Op pool.  Request for surgical clearance:  What type of surgery is being performed?  Cataract Extraction with intraocular lens implantation of the left eye followed by the right eye.   When is this surgery scheduled?  OS 03/21/2021; OD 04/04/2021  What type of clearance is required (medical clearance vs. Pharmacy clearance to hold med vs. Both)?  Medical  Are there any medications that need to be held prior to surgery and how long?  Patient does not need to stop ANY medications   Practice name and name of physician performing surgery?  [facility] - Dr. Rolanda Jay  What is the office phone number?  [phone]   7.   What is the office fax number?         1.  [phone]  8.   Anesthesia type (None, local, MAC, general) ?          1.  Topical anesthesia with IV medication is used     Desma Paganini 02/08/2021, 3:10 PM  _________________________________________________________________   (provider comments below)

## 2021-02-08 NOTE — Telephone Encounter (Signed)
   Patient Name: Ronald [name]  DOB: [birth year] MRN: [id number]  Primary Cardiologist: Carlyle Dolly, MD  Chart reviewed as part of pre-operative protocol coverage. Cataract extractions are recognized in guidelines as low risk surgeries that do not typically require specific preoperative testing or holding of blood thinner therapy. Therefore, given past medical history and time since last visit, based on ACC/AHA guidelines, Ronald [name] would be at acceptable risk for the planned procedure without further cardiovascular testing.   I will route this recommendation to the requesting party via Epic fax function and remove from pre-op pool.  Please call with questions.  Deberah Pelton, NP 02/08/2021, 3:23 PM

## 2021-02-12 ENCOUNTER — Ambulatory Visit (INDEPENDENT_AMBULATORY_CARE_PROVIDER_SITE_OTHER): Payer: Medicare HMO

## 2021-02-12 ENCOUNTER — Other Ambulatory Visit: Payer: Self-pay

## 2021-02-12 DIAGNOSIS — F32A Depression, unspecified: Secondary | ICD-10-CM

## 2021-02-12 DIAGNOSIS — I110 Hypertensive heart disease with heart failure: Secondary | ICD-10-CM | POA: Diagnosis not present

## 2021-02-12 DIAGNOSIS — Z9181 History of falling: Secondary | ICD-10-CM

## 2021-02-12 DIAGNOSIS — J9621 Acute and chronic respiratory failure with hypoxia: Secondary | ICD-10-CM

## 2021-02-12 DIAGNOSIS — E1140 Type 2 diabetes mellitus with diabetic neuropathy, unspecified: Secondary | ICD-10-CM

## 2021-02-12 DIAGNOSIS — J1282 Pneumonia due to coronavirus disease 2019: Secondary | ICD-10-CM | POA: Diagnosis not present

## 2021-02-12 DIAGNOSIS — Z8616 Personal history of COVID-19: Secondary | ICD-10-CM | POA: Diagnosis not present

## 2021-02-12 DIAGNOSIS — J441 Chronic obstructive pulmonary disease with (acute) exacerbation: Secondary | ICD-10-CM

## 2021-02-12 DIAGNOSIS — E1165 Type 2 diabetes mellitus with hyperglycemia: Secondary | ICD-10-CM | POA: Diagnosis not present

## 2021-02-12 DIAGNOSIS — F419 Anxiety disorder, unspecified: Secondary | ICD-10-CM

## 2021-02-12 DIAGNOSIS — I890 Lymphedema, not elsewhere classified: Secondary | ICD-10-CM | POA: Diagnosis not present

## 2021-02-12 DIAGNOSIS — F17210 Nicotine dependence, cigarettes, uncomplicated: Secondary | ICD-10-CM

## 2021-02-12 DIAGNOSIS — E669 Obesity, unspecified: Secondary | ICD-10-CM

## 2021-02-12 DIAGNOSIS — I509 Heart failure, unspecified: Secondary | ICD-10-CM

## 2021-02-12 DIAGNOSIS — I700 Atherosclerosis of aorta: Secondary | ICD-10-CM

## 2021-02-12 DIAGNOSIS — J440 Chronic obstructive pulmonary disease with acute lower respiratory infection: Secondary | ICD-10-CM | POA: Diagnosis not present

## 2021-02-12 DIAGNOSIS — I2510 Atherosclerotic heart disease of native coronary artery without angina pectoris: Secondary | ICD-10-CM

## 2021-02-12 MED ORDER — ESCITALOPRAM OXALATE 20 MG PO TABS
20.0000 mg | ORAL_TABLET | Freq: Every day | ORAL | 0 refills | Status: DC
Start: 2021-02-12 — End: 2021-07-05

## 2021-02-13 DIAGNOSIS — J440 Chronic obstructive pulmonary disease with acute lower respiratory infection: Secondary | ICD-10-CM | POA: Diagnosis not present

## 2021-02-13 DIAGNOSIS — I509 Heart failure, unspecified: Secondary | ICD-10-CM | POA: Diagnosis not present

## 2021-02-13 DIAGNOSIS — U071 COVID-19: Secondary | ICD-10-CM | POA: Diagnosis not present

## 2021-02-13 DIAGNOSIS — I110 Hypertensive heart disease with heart failure: Secondary | ICD-10-CM | POA: Diagnosis not present

## 2021-02-13 DIAGNOSIS — J441 Chronic obstructive pulmonary disease with (acute) exacerbation: Secondary | ICD-10-CM | POA: Diagnosis not present

## 2021-02-13 DIAGNOSIS — J9621 Acute and chronic respiratory failure with hypoxia: Secondary | ICD-10-CM | POA: Diagnosis not present

## 2021-02-13 DIAGNOSIS — E1165 Type 2 diabetes mellitus with hyperglycemia: Secondary | ICD-10-CM | POA: Diagnosis not present

## 2021-02-13 DIAGNOSIS — I890 Lymphedema, not elsewhere classified: Secondary | ICD-10-CM | POA: Diagnosis not present

## 2021-02-13 DIAGNOSIS — E1140 Type 2 diabetes mellitus with diabetic neuropathy, unspecified: Secondary | ICD-10-CM | POA: Diagnosis not present

## 2021-02-13 DIAGNOSIS — J1282 Pneumonia due to coronavirus disease 2019: Secondary | ICD-10-CM | POA: Diagnosis not present

## 2021-02-14 ENCOUNTER — Other Ambulatory Visit: Payer: Self-pay

## 2021-02-14 ENCOUNTER — Ambulatory Visit (INDEPENDENT_AMBULATORY_CARE_PROVIDER_SITE_OTHER): Payer: Medicare HMO

## 2021-02-14 DIAGNOSIS — F32A Depression, unspecified: Secondary | ICD-10-CM

## 2021-02-14 DIAGNOSIS — E1140 Type 2 diabetes mellitus with diabetic neuropathy, unspecified: Secondary | ICD-10-CM

## 2021-02-14 DIAGNOSIS — Z9981 Dependence on supplemental oxygen: Secondary | ICD-10-CM

## 2021-02-14 DIAGNOSIS — J1282 Pneumonia due to coronavirus disease 2019: Secondary | ICD-10-CM | POA: Diagnosis not present

## 2021-02-14 DIAGNOSIS — I890 Lymphedema, not elsewhere classified: Secondary | ICD-10-CM

## 2021-02-14 DIAGNOSIS — J9621 Acute and chronic respiratory failure with hypoxia: Secondary | ICD-10-CM

## 2021-02-14 DIAGNOSIS — I110 Hypertensive heart disease with heart failure: Secondary | ICD-10-CM | POA: Diagnosis not present

## 2021-02-14 DIAGNOSIS — Z6826 Body mass index (BMI) 26.0-26.9, adult: Secondary | ICD-10-CM

## 2021-02-14 DIAGNOSIS — J441 Chronic obstructive pulmonary disease with (acute) exacerbation: Secondary | ICD-10-CM | POA: Diagnosis not present

## 2021-02-14 DIAGNOSIS — Z8616 Personal history of COVID-19: Secondary | ICD-10-CM | POA: Diagnosis not present

## 2021-02-14 DIAGNOSIS — J440 Chronic obstructive pulmonary disease with acute lower respiratory infection: Secondary | ICD-10-CM

## 2021-02-14 DIAGNOSIS — I509 Heart failure, unspecified: Secondary | ICD-10-CM

## 2021-02-14 DIAGNOSIS — E1165 Type 2 diabetes mellitus with hyperglycemia: Secondary | ICD-10-CM

## 2021-02-14 DIAGNOSIS — Z7951 Long term (current) use of inhaled steroids: Secondary | ICD-10-CM

## 2021-02-14 DIAGNOSIS — I700 Atherosclerosis of aorta: Secondary | ICD-10-CM

## 2021-02-14 DIAGNOSIS — I2510 Atherosclerotic heart disease of native coronary artery without angina pectoris: Secondary | ICD-10-CM

## 2021-02-14 DIAGNOSIS — F419 Anxiety disorder, unspecified: Secondary | ICD-10-CM

## 2021-02-14 DIAGNOSIS — F17210 Nicotine dependence, cigarettes, uncomplicated: Secondary | ICD-10-CM

## 2021-02-14 DIAGNOSIS — Z9181 History of falling: Secondary | ICD-10-CM

## 2021-02-14 DIAGNOSIS — E669 Obesity, unspecified: Secondary | ICD-10-CM

## 2021-02-19 ENCOUNTER — Ambulatory Visit (INDEPENDENT_AMBULATORY_CARE_PROVIDER_SITE_OTHER): Payer: Medicare HMO | Admitting: *Deleted

## 2021-02-19 ENCOUNTER — Telehealth: Payer: Self-pay | Admitting: *Deleted

## 2021-02-19 DIAGNOSIS — J449 Chronic obstructive pulmonary disease, unspecified: Secondary | ICD-10-CM

## 2021-02-19 DIAGNOSIS — Z720 Tobacco use: Secondary | ICD-10-CM

## 2021-02-19 DIAGNOSIS — E1142 Type 2 diabetes mellitus with diabetic polyneuropathy: Secondary | ICD-10-CM

## 2021-02-19 NOTE — Chronic Care Management (AMB) (Signed)
Chronic Care Management   CCM RN Visit Note  02/19/2021 Name: Ronald [name] MRN: [id number] DOB: 1945-08-04  Subjective: Ronald [name] is a 75 y.o. year old male who is a primary care patient of [name], FNP. The care management team was consulted for assistance with disease management and care coordination needs.    Engaged with patient by telephone for follow up visit in response to provider referral for case management and/or care coordination services.   Consent to Services:  The patient was given information about Chronic Care Management services, agreed to services, and gave verbal consent prior to initiation of services.  Please see initial visit note for detailed documentation.   Patient agreed to services and verbal consent obtained.   Assessment: Review of patient past medical history, allergies, medications, health status, including review of consultants reports, laboratory and other test data, was performed as part of comprehensive evaluation and provision of chronic care management services.   SDOH (Social Determinants of Health) assessments and interventions performed:    CCM Care Plan  Allergies  Allergen Reactions   Gabapentin Anxiety    Unknown reaction   Metformin And Related Rash    Outpatient Encounter Medications as of 02/19/2021  Medication Sig   albuterol (VENTOLIN HFA) 108 (90 Base) MCG/ACT inhaler INHALE 2 PUFFS EVERY 6 HOURS AS NEEEDED   ALPRAZolam (XANAX) 0.5 MG tablet Take 1 tablet (0.5 mg total) by mouth 2 (two) times daily as needed. for anxiety   amLODipine (NORVASC) 5 MG tablet Take 1 tablet (5 mg total) by mouth daily.   Ascorbic Acid (VITAMIN C) 1000 MG tablet Take 1,000 mg by mouth daily.   aspirin EC 81 MG tablet Take 1 tablet (81 mg total) by mouth daily.   atorvastatin (LIPITOR) 40 MG tablet TAKE 1 TABLET BY MOUTH ONCE DAILY IN THE EVENING   Blood Glucose Monitoring Suppl (ACCU-CHEK GUIDE ME) w/Device KIT Check BS daily Dx E11.9    Budeson-Glycopyrrol-Formoterol (BREZTRI AEROSPHERE) 160-9-4.8 MCG/ACT AERO Inhale 2 puffs into the lungs in the morning and at bedtime.   cholecalciferol (VITAMIN D3) 25 MCG (1000 UNIT) tablet Take 1,000 Units by mouth daily.   escitalopram (LEXAPRO) 20 MG tablet Take 1 tablet (20 mg total) by mouth daily.   fluticasone (FLONASE) 50 MCG/ACT nasal spray Place 2 sprays into both nostrils daily.   furosemide (LASIX) 20 MG tablet Take 1 tablet (20 mg total) by mouth 2 (two) times daily.   glucose blood (ONE TOUCH ULTRA TEST) test strip USE TO CHECK GLUCOSE ONCE DAILY   guaiFENesin (REFENESEN 400 PO) Take 2 tablets by mouth daily as needed (mucus).   ipratropium-albuterol (DUONEB) 0.5-2.5 (3) MG/3ML SOLN USE ONE VIAL BY NEBULIZER FOUR TIMES DAILY   isosorbide mononitrate (IMDUR) 30 MG 24 hr tablet TAKE 1 TABLET BY MOUTH ONCE DAILY. NEEDS FOLLOW UP APPOINTMENT   Lancets (ONETOUCH ULTRASOFT) lancets Use as instructed   DX E11.9   losartan (COZAAR) 100 MG tablet Take 1 tablet (100 mg total) by mouth daily.   metoprolol tartrate (LOPRESSOR) 50 MG tablet Take 1 tablet (50 mg total) by mouth 2 (two) times daily.   mirtazapine (REMERON) 15 MG tablet Take 1 tablet (15 mg total) by mouth at bedtime.   nicotine (NICODERM CQ - DOSED IN MG/24 HOURS) 14 mg/24hr patch Place 1 patch (14 mg total) onto the skin daily.   nitroGLYCERIN (NITROSTAT) 0.4 MG SL tablet Place 1 tablet (0.4 mg total) under the tongue every 5 (five) minutes  x 3 doses as needed for chest pain (if no relief after 2nd dose, proceed to the ED for an evaluation or call 911).   OXYGEN Inhale 3 L into the lungs daily. 24 hours a day   pantoprazole (PROTONIX) 40 MG tablet Take 1 tablet by mouth once daily   No facility-administered encounter medications on file as of 02/19/2021.    Patient Active Problem List   Diagnosis Date Noted   Elevated brain natriuretic peptide (BNP) level 12/21/2020   Elevated troponin 12/21/2020   Hypokalemia 12/21/2020    Hyperglycemia due to diabetes mellitus ([place]) 12/21/2020   Hyperlipidemia 12/21/2020   Acute on chronic respiratory failure with hypoxia ([place]) 12/21/2020   Chest pain 05/23/2020   Controlled substance agreement signed 04/03/2020   Benzodiazepine dependence ([place]) 04/03/2020   Dysphagia 02/27/2020   Tracheal stenosis 12/09/2019   Noncompliance 04/01/2019   Lymphedema 04/01/2019   CHF (congestive heart failure) ([place]) 08/27/2018   Aortic atherosclerosis ([place]) 05/11/2017   Depression 05/05/2017   GERD (gastroesophageal reflux disease) 01/16/2017   Diabetic neuropathy ([place]) 01/16/2017   Anterolisthesis 12/04/2016   History of MI (myocardial infarction) 12/04/2016   OSA (obstructive sleep apnea) 12/04/2016   Closed fracture dislocation of lumbar spine ([place]) 07/25/2016   Closed fracture of body of sternum 07/25/2016   Multiple fractures of cervical spine ([place]) 07/25/2016   Tachycardia 07/25/2016   Tachypnea 07/25/2016   Anxiety 02/14/2016   Chronic respiratory failure with hypoxia ([place]) 07/19/2014   Essential hypertension 07/19/2014   Cigarette smoker 07/19/2014   Chest pain at rest 06/22/2014   CAD in native artery 06/22/2014   Diabetes mellitus ([place]) 06/22/2014   COPD (chronic obstructive pulmonary disease) ([place]) 06/22/2014   Tobacco abuse 06/22/2014   Acute encephalopathy 06/22/2014   Diabetes mellitus type 2 in obese ([place]) 05/11/2014   Simple chronic bronchitis ([place]) 05/11/2014    Conditions to be addressed/monitored:COPD, DMII, and nicotine dependence  Care Plan : [facility] Care Plan  Updates made by Ilean China, RN since 02/19/2021 12:00 AM     Problem: Chronic Disease Management Needs   Priority: Medium  Onset Date: 01/09/2021     Goal: Care Management and Care Coordination Needs Associated with COPD, nicotine dependence, DM, and HTN   Start Date: 01/09/2021  This Visit's Progress: On track  Recent Progress: On track  Priority: Medium  Note:   Current Barriers:   Chronic Disease Management support and education needs related to HTN, COPD, and DMII Non-adherence to smoking cessation recommendation  Non-adherence to prescribed oxygen treatment  RNCM Clinical Goal(s):  Patient will work with RN Case Manager to address needs related to HTN, COPD, and DMII and Limited social support and nicotine dependence  take all medications exactly as prescribed and will call provider for medication related questions through collaboration with RN Care manager, provider, and care team.   Interventions: 1:1 collaboration with primary care provider regarding development and update of comprehensive plan of care as evidenced by provider attestation and co-signature Inter-disciplinary care team collaboration (see longitudinal plan of care) Evaluation of current treatment plan related to  self management and patient's adherence to plan as established by provider; Provided with RN Care Manager contact number and encouraged to reach out as needed   COPD: (Status: Goal on track: YES.) Reviewed and discussed medications and affordability. Receiving Trelegy samples.  Collaborated with PCP regarding need for Trelegy samples Chart reviewed including recent office notes, hosp notes, imaging reports, and lab results Reinforced need to schedule 6 month  f/u with pulmonary, Dr Melvyn Novas Reviewed upcoming cardiology appointment. Verified transportation.  Discussed use of oxygen. Uses 2 liters via [place] at night and while napping. Has a floor concentrator. He does not use it otherwise. Encouraged to use continuously as prescribed. Understand that portable tanks are large and difficult to manage and he is limited by the floor concentrator. He is not a candidate for the portable concentrator because he doesn't use the tanks often enough.  Discussed current status of respiratory conditions. Seems to be doing well per significant other.  Advised to reach out to PCP with any new or worsening symptoms  or to call 911 for any severe symptoms Encouraged to monitor and record oxygen level with pulse ox and to call PCP or pulmonologist with any readings below 90% Discussed mobility and ability to perform ADLs. Limited by shortness of breath. Advised that home health physical therapy has been ordered to help with strengthening. May need to consider pulmonary rehab. Can talk with pulmonologist or PCP regarding this.    Urinary Incontinence:  (Status: Goal on track: YES.) Discussed current management of incontinence Mainly nocturia and some urge incontinence Reviewed upcoming appointment with urologist in [place] and verified transportation.  Previously recommended condom catheter in addition to current supplies. Patient was able to purchase those and was givne instructions by durable medical equipment supplier. Has been sucessfully using them.  Recommended to empty bladder at least every 4 hours and to limit liquid intake several hours before bed Encouraged to reach out to [facility] as needed   Smoking Cessation: (Status: Goal on track: NO.) Reviewed smoking history:  currently smoking 1.5 ppd Reports smoking within 30 minutes of waking up Wife would like for him to stop smoking but he is not motivated at this time He does have nicotine replacement options at home but doesn't use them. Used patches in the hospital.  Verified that he does not smoke near to or while wearing oxygen  Patient has talked with [place] [employer] [facility] and did use some of the patches that they sent Encouraged to continue considering cessation and work towards removing smoking triggers  Patient Goals/Self-Care Activities: Patient will self administer medications as prescribed Patient will attend all scheduled provider appointments Patient will call provider office for new concerns or questions       Plan:Telephone follow up appointment with care management team member scheduled for:  03/21/21 with RNCM and The patient has  been provided with contact information for the care management team and has been advised to call with any health related questions or concerns.   Chong Sicilian, BSN, RN-BC Embedded Chronic Care Manager [facility] / [place] Management Direct Dial: [phone]

## 2021-02-19 NOTE — Patient Instructions (Signed)
Visit Information  PATIENT GOALS:  Goals Addressed             This Visit's Progress    Manage Incontinence   On track    Timeframe:  Long-Range Goal Priority:  Medium Start Date:     01/25/21                        Expected End Date:                       Follow-up: 03/21/21  Keep appt with urologist in [place] Call PCP with any new or worsening symptoms in the meantime Empty bladder at least every 4 hours Limit liquids several hours prior to bedtime Continue using condom catheters at bedtime Call [place] as needed [phone]      Stop Smoking   Not on track    Timeframe:  Short-Term Goal Priority:  High Start Date:    01/09/21                         Expected End Date:  05/11/21                      Follow-up: 03/21/21  Use nicotine replacement like gum or patches Talk with PCP about other options to aid in smoking cessation Remove cigarettes from home Continue to not smoke inside the home Talk with [place] [facility] [facility]      Track and Manage My Symptoms-COPD   On track    Timeframe:  Long-Range Goal Priority:  Medium Start Date:    01/09/21                         Expected End Date:  01/09/22                      Follow Up Date 03/21/21   Take all medications as prescribed Call PCP with any new or worsening symptoms and seek appropriate medical care as needed Use 3L of oxygen continuously. Take portable tanks when leaving the house.  Check and record oxygen level daily and as needed Call PCP with any readings below 90% Keep all medical appointments Call RN Care Manager as needed [phone] Schedule 6 month follow-up with pulmonologist, Dr Melvyn Novas Stop smoking. Consider using nicotine replacement such as patches or gum to help. Talk with PCP about samples of Trelegy   Why is this important?   Tracking your symptoms and other information about your health helps your doctor plan your care.  Write down the symptoms, the time of day, what you were doing and what  medicine you are taking.  You will soon learn how to manage your symptoms.     Notes:         Patient verbalizes understanding of instructions provided today and agrees to view in [place].    Plan:Telephone follow up appointment with care management team member scheduled for:  03/21/21 with RNCM and The patient has been provided with contact information for the care management team and has been advised to call with any health related questions or concerns.   Chong Sicilian, BSN, RN-BC Embedded Chronic Care Manager [facility] [place] [facility] Direct Dial: [phone]

## 2021-02-19 NOTE — Telephone Encounter (Signed)
Patient aware and verbalized understanding. Samples up front

## 2021-02-19 NOTE — Telephone Encounter (Signed)
02/19/2021  Requesting samples of Trelegy if available. Not currently on med list but states that it has worked the best for him.  Forwarding to PCP for review and response.   Chong Sicilian, BSN, RN-BC Embedded Chronic Care Manager [facility] [place] [facility] Direct Dial: [phone]

## 2021-02-19 NOTE — Telephone Encounter (Signed)
Ok to give samples if we have them. If he takes this he can not take Breztri.

## 2021-02-27 DIAGNOSIS — J440 Chronic obstructive pulmonary disease with acute lower respiratory infection: Secondary | ICD-10-CM | POA: Diagnosis not present

## 2021-02-27 DIAGNOSIS — I509 Heart failure, unspecified: Secondary | ICD-10-CM | POA: Diagnosis not present

## 2021-02-27 DIAGNOSIS — J1282 Pneumonia due to coronavirus disease 2019: Secondary | ICD-10-CM | POA: Diagnosis not present

## 2021-02-27 DIAGNOSIS — J441 Chronic obstructive pulmonary disease with (acute) exacerbation: Secondary | ICD-10-CM | POA: Diagnosis not present

## 2021-02-27 DIAGNOSIS — I890 Lymphedema, not elsewhere classified: Secondary | ICD-10-CM | POA: Diagnosis not present

## 2021-02-27 DIAGNOSIS — U071 COVID-19: Secondary | ICD-10-CM | POA: Diagnosis not present

## 2021-02-27 DIAGNOSIS — E1140 Type 2 diabetes mellitus with diabetic neuropathy, unspecified: Secondary | ICD-10-CM | POA: Diagnosis not present

## 2021-02-27 DIAGNOSIS — I110 Hypertensive heart disease with heart failure: Secondary | ICD-10-CM | POA: Diagnosis not present

## 2021-02-27 DIAGNOSIS — J9621 Acute and chronic respiratory failure with hypoxia: Secondary | ICD-10-CM | POA: Diagnosis not present

## 2021-02-27 DIAGNOSIS — E1165 Type 2 diabetes mellitus with hyperglycemia: Secondary | ICD-10-CM | POA: Diagnosis not present

## 2021-03-04 ENCOUNTER — Other Ambulatory Visit: Payer: Self-pay | Admitting: *Deleted

## 2021-03-04 DIAGNOSIS — E1159 Type 2 diabetes mellitus with other circulatory complications: Secondary | ICD-10-CM

## 2021-03-04 DIAGNOSIS — I152 Hypertension secondary to endocrine disorders: Secondary | ICD-10-CM

## 2021-03-04 MED ORDER — METOPROLOL TARTRATE 50 MG PO TABS: 50.0000 mg | ORAL_TABLET | Freq: Two times a day (BID) | ORAL | 0 refills | Status: DC

## 2021-03-06 DIAGNOSIS — J439 Emphysema, unspecified: Secondary | ICD-10-CM | POA: Diagnosis not present

## 2021-03-08 DIAGNOSIS — J449 Chronic obstructive pulmonary disease, unspecified: Secondary | ICD-10-CM | POA: Diagnosis not present

## 2021-03-08 DIAGNOSIS — E1142 Type 2 diabetes mellitus with diabetic polyneuropathy: Secondary | ICD-10-CM | POA: Diagnosis not present

## 2021-03-21 ENCOUNTER — Telehealth: Payer: Self-pay | Admitting: Family

## 2021-03-21 ENCOUNTER — Telehealth: Payer: Medicare HMO | Admitting: *Deleted

## 2021-03-21 NOTE — Telephone Encounter (Signed)
Patient states he does not doo breiztzi he does trellagy samples up front other taking off med list

## 2021-03-21 NOTE — Telephone Encounter (Signed)
Pt's SO calling to see if we have any trelegy samples. Please call back.

## 2021-03-21 NOTE — Telephone Encounter (Signed)
PT is currently taking Breztri. He can not take this and Trelegy. They are the same. He can be on the one that works best for him.

## 2021-03-21 NOTE — Addendum Note (Signed)
Addended by: Brynda Peon F on: 03/21/2021 04:00 PM   Modules accepted: Orders

## 2021-03-21 NOTE — Telephone Encounter (Signed)
Not on med list is he doing both inhalers?

## 2021-03-26 ENCOUNTER — Ambulatory Visit: Payer: Medicare HMO | Admitting: Urology

## 2021-04-05 DIAGNOSIS — J439 Emphysema, unspecified: Secondary | ICD-10-CM | POA: Diagnosis not present

## 2021-04-09 ENCOUNTER — Other Ambulatory Visit: Payer: Self-pay | Admitting: Family Medicine

## 2021-04-09 DIAGNOSIS — I4892 Unspecified atrial flutter: Secondary | ICD-10-CM

## 2021-04-09 HISTORY — DX: Unspecified atrial flutter: I48.92

## 2021-04-15 ENCOUNTER — Encounter: Payer: Self-pay | Admitting: Family

## 2021-04-15 ENCOUNTER — Ambulatory Visit: Payer: Medicare HMO | Admitting: Family

## 2021-04-16 ENCOUNTER — Telehealth: Payer: Self-pay | Admitting: Family

## 2021-04-16 NOTE — Telephone Encounter (Signed)
Appt made samples up front

## 2021-04-18 ENCOUNTER — Ambulatory Visit (INDEPENDENT_AMBULATORY_CARE_PROVIDER_SITE_OTHER): Payer: Medicare HMO | Admitting: Urology

## 2021-04-18 ENCOUNTER — Encounter: Payer: Self-pay | Admitting: Urology

## 2021-04-18 ENCOUNTER — Other Ambulatory Visit: Payer: Self-pay

## 2021-04-18 VITALS — BP 144/63 | HR 68 | Temp 99.0°F

## 2021-04-18 DIAGNOSIS — R351 Nocturia: Secondary | ICD-10-CM | POA: Diagnosis not present

## 2021-04-18 DIAGNOSIS — N3941 Urge incontinence: Secondary | ICD-10-CM | POA: Diagnosis not present

## 2021-04-18 LAB — URINALYSIS, ROUTINE W REFLEX MICROSCOPIC
Bilirubin, UA: NEGATIVE
Glucose, UA: NEGATIVE
Nitrite, UA: NEGATIVE
Specific Gravity, UA: 1.01 (ref 1.005–1.030)
Urobilinogen, Ur: 1 mg/dL (ref 0.2–1.0)
pH, UA: 6 (ref 5.0–7.5)

## 2021-04-18 LAB — MICROSCOPIC EXAMINATION: Renal Epithel, UA: NONE SEEN /hpf

## 2021-04-18 LAB — BLADDER SCAN AMB NON-IMAGING: Scan Result: 6

## 2021-04-18 MED ORDER — GEMTESA 75 MG PO TABS: 75.0000 mg | ORAL_TABLET | Freq: Every day | ORAL | 0 refills | Status: DC

## 2021-04-18 NOTE — Progress Notes (Signed)
post void residual=6 

## 2021-04-18 NOTE — Progress Notes (Signed)
Urological Symptom Review  Patient is experiencing the following symptoms: Frequent urination Hard to postpone urination Get up at night to urinate Leakage of urine Erection problems (male only)   Review of Systems  Gastrointestinal (upper)  : Negative for upper GI symptoms  Gastrointestinal (lower) : Diarrhea Constipation  Constitutional : Fatigue  Skin: Skin rash/lesion  Eyes: Blurred vision  Ear/Nose/Throat : Sinus problems  Hematologic/Lymphatic: Swollen glands  Cardiovascular : Chest pain  Respiratory : Shortness of breath  Endocrine: Negative for endocrine symptoms  Musculoskeletal: Back pain Joint pain  Neurological: Negative for neurological symptoms  Psychologic: Depression Anxiety

## 2021-04-18 NOTE — Progress Notes (Signed)
Subjective: 1. Urge incontinence   2. Nocturia      Consult requested by Pricilla Handler FNP  Ronald [name] is a 75 yo male with multiple medical problems who is sent for evaluation of urge incontinence and nocturia that is currently managed with a condom cath but it falls off.  He did have a PSA in May that was 4.9 with an 11.6% f/t ratio.   HIs urine in 4/22 was clear.   His Cr on 01/17/21 was 0.88.  He has had prior TIA's.  He had a 72 pk yr smoking history.   He is on furosemide but that has been held because of the UUI.  He has frequency that is variable.  He has urgency with UUI in the day.  He has nocturia 3-4x and will leak prior to getting to the BR and also without waking.  He has mild dysuria but no hematuria.  He has a slow stream at times and feels like he doesn't empty.  His IPSS is 20.  He has had no UTI's or GU surgery. He has no ankle edema but has some arm swelling with lymph edema.   He has no constipation at this time but he has UC with variable bowel function.    ROS:  ROS  Allergies  Allergen Reactions   Gabapentin Anxiety    Unknown reaction   Metformin And Related Rash    Past Medical History:  Diagnosis Date   Anxiety    Asthma    Chronic lower back pain    COPD (chronic obstructive pulmonary disease) (HCC)    Coronary artery disease    a. NSTEMI 05/2014 s/p DESx2 [facility] at [facility].   Depression    Educated about COVID-19 virus infection 03/06/2020   GERD (gastroesophageal reflux disease)    High cholesterol    Hypertension    NSTEMI (non-ST elevated myocardial infarction) ([place]) 05/2014   with stent placement   Sleep apnea    Stroke ([place]) 2017   anyeusym    TIA (transient ischemic attack)    "they say I've had some mini strokes; don't know when"; denies residual on 06/22/2014)   Type II diabetes mellitus ([place])    Ulcerative colitis ([place])     Past Surgical History:  Procedure Laterality Date   APPENDECTOMY     BIOPSY  07/20/2020   Procedure: BIOPSY;  Surgeon:  Harvel Quale, MD;  [facility];  Service: Gastroenterology;;   CARDIAC CATHETERIZATION  [phone] X 3   CHOLECYSTECTOMY     COLONOSCOPY WITH PROPOFOL N/A 07/20/2020   Procedure: COLONOSCOPY WITH PROPOFOL;  Surgeon: Harvel Quale, MD;  [facility];  Service: Gastroenterology;  Laterality: N/A;  1:15   CORONARY ANGIOPLASTY WITH STENT PLACEMENT  05/2014   "2"   ESOPHAGEAL DILATION N/A 07/20/2020   Procedure: ESOPHAGEAL DILATION;  Surgeon: Harvel Quale, MD;  [facility];  Service: Gastroenterology;  Laterality: N/A;   ESOPHAGOGASTRODUODENOSCOPY (EGD) WITH PROPOFOL N/A 07/20/2020   Procedure: ESOPHAGOGASTRODUODENOSCOPY (EGD) WITH PROPOFOL;  Surgeon: Harvel Quale, MD;  [facility];  Service: Gastroenterology;  Laterality: N/A;   LEFT HEART CATH AND CORONARY ANGIOGRAPHY N/A 05/25/2020   Procedure: LEFT HEART CATH AND CORONARY ANGIOGRAPHY;  Surgeon: Martinique, Peter M, MD;  Location: [facility];  Service: Cardiovascular;  Laterality: N/A;   POLYPECTOMY  07/20/2020   Procedure: POLYPECTOMY INTESTINAL;  Surgeon: Harvel Quale, MD;  [facility];  Service: Gastroenterology;;  TUMOR EXCISION Right ~ 1999   "side of my upper head"    Social History   Socioeconomic History   Marital status: Soil scientist    Spouse name: [name]   Number of children: 1   Years of education: Not on file   Highest education level: Not on file  Occupational History   Occupation: Retired    Comment: Aeronautical engineer  Tobacco Use   Smoking status: Every Day    Packs/day: 1.50    Years: 48.00    Pack years: 72.00    Types: Cigarettes    Start date: 02/12/1966   Smokeless tobacco: Never   Tobacco comments:    smokes 5-6 cigarettes per day 06/07/2020  Vaping Use   Vaping Use: Never used  Substance and Sexual Activity   Alcohol use: Not Currently    Alcohol/week: 0.0 standard drinks   Drug use: No    Sexual activity: Never  Other Topics Concern   Not on file  Social History Narrative   Staying with his ex-wife, [name]   Has 2 children (today he told me one child 01/15/21)   Social Determinants of Health   Financial Resource Strain: Low Risk    Difficulty of Paying Living Expenses: Not very hard  Food Insecurity: No Food Insecurity   Worried About Charity fundraiser in the Last Year: Never true   [place] in the Last Year: Never true  Transportation Needs: No Transportation Needs   Lack of Transportation (Medical): No   Lack of Transportation (Non-Medical): No  Physical Activity: Inactive   Days of Exercise per Week: 0 days   Minutes of Exercise per Session: 0 min  Stress: No Stress Concern Present   Feeling of Stress : Only a little  Social Connections: Moderately Isolated   Frequency of Communication with Friends and Family: Twice a week   Frequency of Social Gatherings with Friends and Family: Once a week   Attends Religious Services: Never   Marine scientist or Organizations: No   Attends Music therapist: Never   Marital Status: Living with partner  Intimate Partner Violence: Not At Risk   Fear of Current or Ex-Partner: No   Emotionally Abused: No   Physically Abused: No   Sexually Abused: No    Family History  Problem Relation Age of Onset   CAD Father    Lung cancer Brother        smoked   Cancer Brother        lung   Leukemia Sister    Dementia Sister    Stroke Mother    Emphysema Sister    Cancer Brother        lung    Anti-infectives: Anti-infectives (From admission, onward)    None       Current Outpatient Medications  Medication Sig Dispense Refill   Vibegron (GEMTESA) 75 MG TABS Take 75 mg by mouth daily. 28 tablet 0   albuterol (VENTOLIN HFA) 108 (90 Base) MCG/ACT inhaler INHALE 2 PUFFS EVERY 6 HOURS AS NEEEDED 6.7 g 0   ALPRAZolam (XANAX) 0.5 MG tablet Take 1 tablet (0.5 mg total) by mouth 2 (two) times daily as  needed. for anxiety 60 tablet 2   amLODipine (NORVASC) 5 MG tablet Take 1 tablet (5 mg total) by mouth daily. 90 tablet 0   Ascorbic Acid (VITAMIN C) 1000 MG tablet Take 1,000 mg by mouth daily.     aspirin EC 81 MG tablet  Take 1 tablet (81 mg total) by mouth daily.     atorvastatin (LIPITOR) 40 MG tablet TAKE 1 TABLET BY MOUTH ONCE DAILY IN THE EVENING 90 tablet 0   Blood Glucose Monitoring Suppl (ACCU-CHEK GUIDE ME) w/Device KIT Check BS daily Dx E11.9 1 kit 0   cholecalciferol (VITAMIN D3) 25 MCG (1000 UNIT) tablet Take 1,000 Units by mouth daily.     escitalopram (LEXAPRO) 20 MG tablet Take 1 tablet (20 mg total) by mouth daily. 90 tablet 0   fluticasone (FLONASE) 50 MCG/ACT nasal spray Place 2 sprays into both nostrils daily. 16 g 6   furosemide (LASIX) 20 MG tablet Take 1 tablet (20 mg total) by mouth 2 (two) times daily. 60 tablet 2   glucose blood (ONE TOUCH ULTRA TEST) test strip USE TO CHECK GLUCOSE ONCE DAILY 100 each 3   guaiFENesin (REFENESEN 400 PO) Take 2 tablets by mouth daily as needed (mucus).     ipratropium-albuterol (DUONEB) 0.5-2.5 (3) MG/3ML SOLN USE ONE VIAL BY NEBULIZER FOUR TIMES DAILY 360 mL 3   isosorbide mononitrate (IMDUR) 30 MG 24 hr tablet Take 1 tablet (30 mg total) by mouth daily. 90 tablet 1   Lancets (ONETOUCH ULTRASOFT) lancets Use as instructed   DX E11.9 100 each 12   losartan (COZAAR) 100 MG tablet Take 1 tablet (100 mg total) by mouth daily. 90 tablet 1   metoprolol tartrate (LOPRESSOR) 50 MG tablet Take 1 tablet (50 mg total) by mouth 2 (two) times daily. 180 tablet 0   mirtazapine (REMERON) 15 MG tablet Take 1 tablet (15 mg total) by mouth at bedtime. 90 tablet 2   nicotine (NICODERM CQ - DOSED IN MG/24 HOURS) 14 mg/24hr patch Place 1 patch (14 mg total) onto the skin daily. 90 patch 1   OXYGEN Inhale 3 L into the lungs daily. 24 hours a day     pantoprazole (PROTONIX) 40 MG tablet Take 1 tablet by mouth once daily 90 tablet 0   No current  facility-administered medications for this visit.     Objective: Vital signs in last 24 hours: BP (!) 144/63   Pulse 68   Temp 99 F (37.2 C)   Intake/Output from previous day: No intake/output data recorded. Intake/Output this shift: @IOTHISSHIFT @   Physical Exam Vitals reviewed.  Constitutional:      Appearance: Normal appearance.  HENT:     Head: Normocephalic and atraumatic.  Neck:     Comments: Healed trach site noted Cardiovascular:     Rate and Rhythm: Normal rate and regular rhythm.  Pulmonary:     Effort: Pulmonary effort is normal.     Comments: Distant BS  Abdominal:     Palpations: Abdomen is soft.     Tenderness: There is no abdominal tenderness.     Hernia: No hernia is present.  Genitourinary:    Comments: Circ phallus with adequate meatus. Scrotum, testes and epididymis normal. AP without lesions. NST without mass. Prostate 2+ smooth and firm. SV non-palpable.  Musculoskeletal:        General: Swelling present. Normal range of motion.     Cervical back: Normal range of motion and neck supple.  Skin:    General: Skin is warm and dry.  Neurological:     General: No focal deficit present.     Mental Status: He is alert and oriented to person, place, and time.  Psychiatric:        Mood and Affect: Mood normal.  Behavior: Behavior normal.    Lab Results:  Results for orders placed or performed in visit on 04/18/21 (from the past 24 hour(s))  Urinalysis, Routine w reflex microscopic     Status: Abnormal   Collection Time: 04/18/21  3:39 PM  Result Value Ref Range   Specific Gravity, UA 1.010 1.005 - 1.030   pH, UA 6.0 5.0 - 7.5   Color, UA Yellow Yellow   Appearance Ur Clear Clear   Leukocytes,UA Trace (A) Negative   Protein,UA 1+ (A) Negative/Trace   Glucose, UA Negative Negative   Ketones, UA Trace (A) Negative   RBC, UA Trace (A) Negative   Bilirubin, UA Negative Negative   Urobilinogen, Ur 1.0 0.2 - 1.0 mg/dL   Nitrite, UA  Negative Negative   Microscopic Examination See below:    Narrative   Performed at:  [facility] [address], [place], [place]  [id number] Lab Director: Mina Marble MT, Phone:  [phone]  Microscopic Examination     Status: Abnormal   Collection Time: 04/18/21  3:39 PM   Urine  Result Value Ref Range   WBC, UA 6-10 (A) 0 - 5 /hpf   RBC 0-2 0 - 2 /hpf   Epithelial Cells (non renal) 0-10 0 - 10 /hpf   Renal Epithel, UA None seen None seen /hpf   Bacteria, UA Few None seen/Few   Narrative   Performed at:  [facility] [address] [place], [place]  [id number] Lab Director: Bledsoe, Phone:  [phone]    BMET No results for input(s): NA, K, CL, CO2, GLUCOSE, BUN, CREATININE, CALCIUM in the last 72 hours. PT/INR No results for input(s): LABPROT, INR in the last 72 hours. ABG No results for input(s): PHART, HCO3 in the last 72 hours.  Invalid input(s): PCO2, PO2  Studies/Results: No results found.   Assessment/Plan: Urge incontinence.   He is emptying well so I am going to try him on Gemtesa which is the safest med at this time for him and I will reach out to [facility] about whether their pharmD could help with med selection.  He will return next available for a PVR.   Elevated PSA with BPH.  His PSA is mildly elevated but he has multiple comorbidities so a biopsy would be problematic.  That just needs to be followed.    Meds ordered this encounter  Medications   Vibegron (GEMTESA) 75 MG TABS    Sig: Take 75 mg by mouth daily.    Dispense:  28 tablet    Refill:  0      Orders Placed This Encounter  Procedures   Microscopic Examination   Urinalysis, Routine w reflex microscopic   BLADDER SCAN AMB NON-IMAGING      Return for Next available for PVR.    CC: [name] NP.      Irine Seal 04/18/2021 [phone]

## 2021-04-23 ENCOUNTER — Ambulatory Visit: Payer: Medicare HMO | Admitting: Family

## 2021-04-23 DIAGNOSIS — J383 Other diseases of vocal cords: Secondary | ICD-10-CM | POA: Diagnosis not present

## 2021-04-23 DIAGNOSIS — J398 Other specified diseases of upper respiratory tract: Secondary | ICD-10-CM | POA: Diagnosis not present

## 2021-04-23 DIAGNOSIS — J387 Other diseases of larynx: Secondary | ICD-10-CM | POA: Diagnosis not present

## 2021-04-23 DIAGNOSIS — J384 Edema of larynx: Secondary | ICD-10-CM | POA: Diagnosis not present

## 2021-04-26 DIAGNOSIS — I10 Essential (primary) hypertension: Secondary | ICD-10-CM | POA: Diagnosis not present

## 2021-04-26 DIAGNOSIS — R0902 Hypoxemia: Secondary | ICD-10-CM | POA: Diagnosis not present

## 2021-04-27 ENCOUNTER — Emergency Department (HOSPITAL_COMMUNITY): Payer: Medicare HMO

## 2021-04-27 ENCOUNTER — Encounter (HOSPITAL_COMMUNITY): Payer: Self-pay

## 2021-04-27 ENCOUNTER — Other Ambulatory Visit: Payer: Self-pay | Admitting: Family

## 2021-04-27 ENCOUNTER — Other Ambulatory Visit: Payer: Self-pay

## 2021-04-27 ENCOUNTER — Emergency Department (HOSPITAL_COMMUNITY)
Admission: EM | Admit: 2021-04-27 | Discharge: 2021-04-27 | Disposition: A | Discharge status: Home / Self Care | Payer: Medicare HMO | Attending: Emergency Medicine | Admitting: Emergency Medicine

## 2021-04-27 DIAGNOSIS — I110 Hypertensive heart disease with heart failure: Secondary | ICD-10-CM | POA: Insufficient documentation

## 2021-04-27 DIAGNOSIS — Z8616 Personal history of COVID-19: Secondary | ICD-10-CM | POA: Diagnosis not present

## 2021-04-27 DIAGNOSIS — E119 Type 2 diabetes mellitus without complications: Secondary | ICD-10-CM | POA: Diagnosis not present

## 2021-04-27 DIAGNOSIS — J45909 Unspecified asthma, uncomplicated: Secondary | ICD-10-CM | POA: Diagnosis not present

## 2021-04-27 DIAGNOSIS — Z79899 Other long term (current) drug therapy: Secondary | ICD-10-CM | POA: Diagnosis not present

## 2021-04-27 DIAGNOSIS — Z7952 Long term (current) use of systemic steroids: Secondary | ICD-10-CM | POA: Insufficient documentation

## 2021-04-27 DIAGNOSIS — J439 Emphysema, unspecified: Secondary | ICD-10-CM

## 2021-04-27 DIAGNOSIS — J441 Chronic obstructive pulmonary disease with (acute) exacerbation: Secondary | ICD-10-CM | POA: Insufficient documentation

## 2021-04-27 DIAGNOSIS — I509 Heart failure, unspecified: Secondary | ICD-10-CM | POA: Insufficient documentation

## 2021-04-27 DIAGNOSIS — F17210 Nicotine dependence, cigarettes, uncomplicated: Secondary | ICD-10-CM | POA: Insufficient documentation

## 2021-04-27 DIAGNOSIS — R0602 Shortness of breath: Secondary | ICD-10-CM | POA: Diagnosis not present

## 2021-04-27 DIAGNOSIS — I2510 Atherosclerotic heart disease of native coronary artery without angina pectoris: Secondary | ICD-10-CM | POA: Insufficient documentation

## 2021-04-27 DIAGNOSIS — Z7982 Long term (current) use of aspirin: Secondary | ICD-10-CM | POA: Insufficient documentation

## 2021-04-27 DIAGNOSIS — R0789 Other chest pain: Secondary | ICD-10-CM | POA: Diagnosis not present

## 2021-04-27 DIAGNOSIS — R69 Illness, unspecified: Secondary | ICD-10-CM | POA: Diagnosis not present

## 2021-04-27 LAB — BASIC METABOLIC PANEL
Anion gap: 10 (ref 5–15)
BUN: 24 mg/dL — ABNORMAL HIGH (ref 8–23)
CO2: 31 mmol/L (ref 22–32)
Calcium: 8 mg/dL — ABNORMAL LOW (ref 8.9–10.3)
Chloride: 91 mmol/L — ABNORMAL LOW (ref 98–111)
Creatinine, Ser: 1.13 mg/dL (ref 0.61–1.24)
GFR, Estimated: >60 mL/min (ref >60)
Glucose, Bld: 174 mg/dL — ABNORMAL HIGH (ref 70–99)
Potassium: 3 mmol/L — ABNORMAL LOW (ref 3.5–5.1)
Sodium: 132 mmol/L — ABNORMAL LOW (ref 135–145)

## 2021-04-27 LAB — CBC
HCT: 43.3 % (ref 39.0–52.0)
Hemoglobin: 14.9 g/dL (ref 13.0–17.0)
MCH: 29.6 pg (ref 26.0–34.0)
MCHC: 34.4 g/dL (ref 30.0–36.0)
MCV: 86.1 fL (ref 80.0–100.0)
Platelets: 294 10*3/uL (ref 150–400)
RBC: 5.03 MIL/uL (ref 4.22–5.81)
RDW: 12.9 % (ref 11.5–15.5)
WBC: 10.1 10*3/uL (ref 4.0–10.5)
nRBC: 0 % (ref 0.0–0.2)

## 2021-04-27 MED ORDER — ALBUTEROL SULFATE (2.5 MG/3ML) 0.083% IN NEBU
2.5000 mg | INHALATION_SOLUTION | Freq: Once | RESPIRATORY_TRACT | Status: AC
  Administered 2021-04-27: 2.5 mg via RESPIRATORY_TRACT
  Filled 2021-04-27: qty 3

## 2021-04-27 MED ORDER — METHYLPREDNISOLONE SODIUM SUCC 125 MG IJ SOLR
125.0000 mg | Freq: Once | INTRAMUSCULAR | Status: AC
  Administered 2021-04-27: 125 mg via INTRAMUSCULAR
  Filled 2021-04-27: qty 2

## 2021-04-27 MED ORDER — ALBUTEROL SULFATE HFA 108 (90 BASE) MCG/ACT IN AERS: 2.0000 | INHALATION_SPRAY | RESPIRATORY_TRACT | Status: DC | PRN

## 2021-04-27 MED ORDER — DOXYCYCLINE HYCLATE 100 MG PO CAPS: 100.0000 mg | ORAL_CAPSULE | Freq: Two times a day (BID) | ORAL | 0 refills | Status: DC

## 2021-04-27 MED ORDER — IPRATROPIUM-ALBUTEROL 0.5-2.5 (3) MG/3ML IN SOLN
3.0000 mL | Freq: Once | RESPIRATORY_TRACT | Status: AC
  Administered 2021-04-27: 3 mL via RESPIRATORY_TRACT
  Filled 2021-04-27: qty 3

## 2021-04-27 MED ORDER — PREDNISONE 10 MG PO TABS: 20.0000 mg | ORAL_TABLET | Freq: Every day | ORAL | 0 refills | Status: DC

## 2021-04-27 MED ORDER — POTASSIUM CHLORIDE CRYS ER 20 MEQ PO TBCR
40.0000 meq | EXTENDED_RELEASE_TABLET | Freq: Once | ORAL | Status: AC
  Administered 2021-04-27: 40 meq via ORAL
  Filled 2021-04-27: qty 2

## 2021-04-27 NOTE — ED Notes (Signed)
Called [facility], they are on the way

## 2021-04-27 NOTE — ED Notes (Signed)
Discussed with pt the need to contact O2 supplier for a portable O2 tank.

## 2021-04-27 NOTE — Discharge Instructions (Signed)
Return to the emergency department for shortness of breath or sooner she can

## 2021-04-27 NOTE — ED Provider Notes (Signed)
[facility] EMERGENCY DEPARTMENT Provider Note   CSN: [id number] Arrival date & time: 04/27/21  1905     History Chief Complaint  Patient presents with   Shortness of Breath    Hx COPD    Ronald [name] is a 75 y.o. male.  Patient complains of some shortness of breath and wheezing.  Patient has a history of COPD.  No fevers no chills  The history is provided by the patient and medical records. No language interpreter was used.  Shortness of Breath Severity:  Moderate Onset quality:  Sudden Duration:  1 day Timing:  Constant Progression:  Waxing and waning Chronicity:  New Context: not activity   Relieved by:  Nothing Worsened by:  Nothing Ineffective treatments:  None tried Associated symptoms: no abdominal pain, no chest pain, no cough, no headaches and no rash       Past Medical History:  Diagnosis Date   Anxiety    Asthma    Chronic lower back pain    COPD (chronic obstructive pulmonary disease) ([place])    Coronary artery disease    a. NSTEMI 05/2014 s/p DESx2 to LAD at [facility].   Depression    Educated about COVID-19 virus infection 03/06/2020   GERD (gastroesophageal reflux disease)    High cholesterol    Hypertension    NSTEMI (non-ST elevated myocardial infarction) ([place]) 05/2014   with stent placement   Sleep apnea    Stroke ([place]) 2017   anyeusym    TIA (transient ischemic attack)    "they say I've had some mini strokes; don't know when"; denies residual on 06/22/2014)   Type II diabetes mellitus ([place])    Ulcerative colitis [facility])     Patient Active Problem List   Diagnosis Date Noted   Elevated brain natriuretic peptide (BNP) level 12/21/2020   Elevated troponin 12/21/2020   Hypokalemia 12/21/2020   Hyperglycemia due to diabetes mellitus ([place]) 12/21/2020   Hyperlipidemia 12/21/2020   Acute on chronic respiratory failure with hypoxia ([place]) 12/21/2020   Chest pain 05/23/2020   Controlled substance agreement signed 04/03/2020   Benzodiazepine  dependence ([place]) 04/03/2020   Dysphagia 02/27/2020   Tracheal stenosis 12/09/2019   Noncompliance 04/01/2019   Lymphedema 04/01/2019   CHF (congestive heart failure) ([place]) 08/27/2018   Aortic atherosclerosis ([place]) 05/11/2017   Depression 05/05/2017   GERD (gastroesophageal reflux disease) 01/16/2017   Diabetic neuropathy ([place]) 01/16/2017   Anterolisthesis 12/04/2016   History of MI (myocardial infarction) 12/04/2016   OSA (obstructive sleep apnea) 12/04/2016   Closed fracture dislocation of lumbar spine ([place]) 07/25/2016   Closed fracture of body of sternum 07/25/2016   Multiple fractures of cervical spine ([place]) 07/25/2016   Tachycardia 07/25/2016   Tachypnea 07/25/2016   Anxiety 02/14/2016   Chronic respiratory failure with hypoxia ([place]) 07/19/2014   Essential hypertension 07/19/2014   Cigarette smoker 07/19/2014   Chest pain at rest 06/22/2014   CAD in native artery 06/22/2014   Diabetes mellitus ([place]) 06/22/2014   COPD (chronic obstructive pulmonary disease) ([place]) 06/22/2014   Tobacco abuse 06/22/2014   Acute encephalopathy 06/22/2014   Diabetes mellitus type 2 in obese ([place]) 05/11/2014   Simple chronic bronchitis ([place]) 05/11/2014    Past Surgical History:  Procedure Laterality Date   APPENDECTOMY     BIOPSY  07/20/2020   Procedure: BIOPSY;  Surgeon: Harvel Quale, MD;  Location: [facility] ENDO SUITE;  Service: Gastroenterology;;   CARDIAC CATHETERIZATION  1990's X 3   CHOLECYSTECTOMY  COLONOSCOPY WITH PROPOFOL N/A 07/20/2020   Procedure: COLONOSCOPY WITH PROPOFOL;  Surgeon: Harvel Quale, MD;  Location: [facility] ENDO SUITE;  Service: Gastroenterology;  Laterality: N/A;  1:15   CORONARY ANGIOPLASTY WITH STENT PLACEMENT  05/2014   "2"   ESOPHAGEAL DILATION N/A 07/20/2020   Procedure: ESOPHAGEAL DILATION;  Surgeon: Harvel Quale, MD;  Location: [facility] ENDO SUITE;  Service: Gastroenterology;  Laterality: N/A;   ESOPHAGOGASTRODUODENOSCOPY (EGD) WITH  PROPOFOL N/A 07/20/2020   Procedure: ESOPHAGOGASTRODUODENOSCOPY (EGD) WITH PROPOFOL;  Surgeon: Harvel Quale, MD;  Location: [facility] END[facility];  Service: Gastroenterology;  Laterality: N/A;   LEFT HEART CATH AND CORONARY ANGIOGRAPHY N/A 05/25/2020   Procedure: LEFT HEART CATH AND CORONARY ANGIOGRAPHY;  Surgeon: Martinique, Peter M, MD;  Location: [place] [facility];  Service: Cardiovascular;  Laterality: N/A;   POLYPECTOMY  07/20/2020   Procedure: POLYPECTOMY INTESTINAL;  Surgeon: Harvel Quale, MD;  Location: [facility];  Service: Gastroenterology;;   TUMOR EXCISION Right ~ 1999   "side of my upper head"       Family History  Problem Relation Age of Onset   CAD Father    Lung cancer Brother        smoked   Cancer Brother        lung   Leukemia Sister    Dementia Sister    Stroke Mother    Emphysema Sister    Cancer Brother        lung    Social History   Tobacco Use   Smoking status: Every Day    Packs/day: 1.50    Years: 48.00    Pack years: 72.00    Types: Cigarettes    Start date: 02/12/1966   Smokeless tobacco: Never   Tobacco comments:    smokes 5-6 cigarettes per day 06/07/2020  Vaping Use   Vaping Use: Never used  Substance Use Topics   Alcohol use: Not Currently    Alcohol/week: 0.0 standard drinks   Drug use: No    Home Medications Prior to Admission medications   Medication Sig Start Date End Date Taking? Authorizing Provider  doxycycline (VIBRAMYCIN) 100 MG capsule Take 1 capsule (100 mg total) by mouth 2 (two) times daily. One po bid x 7 days 04/27/21  Yes Milton Ferguson, MD  predniSONE (DELTASONE) 10 MG tablet Take 2 tablets (20 mg total) by mouth daily. 04/27/21  Yes Milton Ferguson, MD  albuterol (VENTOLIN HFA) 108 (90 Base) MCG/ACT inhaler INHALE 2 PUFFS EVERY 6 HOURS AS NEEEDED 11/02/19   [name] A, FNP  ALPRAZolam Duanne Moron) 0.5 MG tablet Take 1 tablet (0.5 mg total) by mouth 2 (two) times daily as needed. for anxiety 01/17/21    [name] A, FNP  amLODipine (NORVASC) 5 MG tablet Take 1 tablet (5 mg total) by mouth daily. 02/04/21   [name] A, FNP  Ascorbic Acid (VITAMIN C) 1000 MG tablet Take 1,000 mg by mouth daily.    [provider]  aspirin EC 81 MG tablet Take 1 tablet (81 mg total) by mouth daily. 10/15/18   Herminio Commons, MD  atorvastatin (LIPITOR) 40 MG tablet TAKE 1 TABLET BY MOUTH ONCE DAILY IN THE EVENING 12/05/20   [name] A, FNP  Blood Glucose Monitoring Suppl (ACCU-CHEK GUIDE ME) w/Device KIT Check BS daily Dx E11.9 05/16/20   [name] A, FNP  cholecalciferol (VITAMIN D3) 25 MCG (1000 UNIT) tablet Take 1,000 Units by mouth daily.    [provider]  escitalopram (LEXAPRO) 20 MG tablet Take 1 tablet (20 mg total) by mouth daily. 02/12/21   [name], FNP  fluticasone (FLONASE) 50 MCG/ACT nasal spray Place 2 sprays into both nostrils daily. 11/15/20   Gwenlyn Perking, FNP  furosemide (LASIX) 20 MG tablet Take 1 tablet (20 mg total) by mouth 2 (two) times daily. 01/01/21   [name] A, FNP  glucose blood (ONE TOUCH ULTRA TEST) test strip USE TO CHECK GLUCOSE ONCE DAILY 02/11/18   Terald Sleeper, PA-C  guaiFENesin (REFENESEN 400 PO) Take 2 tablets by mouth daily as needed (mucus).    [provider]  ipratropium-albuterol (DUONEB) 0.5-2.5 (3) MG/3ML SOLN USE ONE VIAL BY NEBULIZER FOUR TIMES DAILY 01/02/21   [name] A, FNP  isosorbide mononitrate (IMDUR) 30 MG 24 hr tablet Take 1 tablet (30 mg total) by mouth daily. 04/09/21   Arnoldo Lenis, MD  Lancets [facility] ULTRASOFT) lancets Use as instructed   DX E11.9 05/05/17   [name], FNP  losartan (COZAAR) 100 MG tablet Take 1 tablet (100 mg total) by mouth daily. 02/05/21   [name] A, FNP  metoprolol tartrate (LOPRESSOR) 50 MG tablet Take 1 tablet (50 mg total) by mouth 2 (two) times daily. 03/04/21   [name], FNP  mirtazapine (REMERON) 15 MG tablet Take 1 tablet (15 mg  total) by mouth at bedtime. 01/01/21   [name] A, FNP  nicotine (NICODERM CQ - DOSED IN MG/24 HOURS) 14 mg/24hr patch Place 1 patch (14 mg total) onto the skin daily. 06/14/20   [name] A, FNP  OXYGEN Inhale 3 L into the lungs daily. 24 hours a day    [provider]  pantoprazole (PROTONIX) 40 MG tablet Take 1 tablet by mouth once daily 11/26/20   [name] A, FNP  Vibegron (GEMTESA) 75 MG TABS Take 75 mg by mouth daily. 04/18/21   Irine Seal, MD    Allergies    Gabapentin and Metformin and related  Review of Systems   Review of Systems  Constitutional:  Negative for appetite change and fatigue.  HENT:  Negative for congestion, ear discharge and sinus pressure.   Eyes:  Negative for discharge.  Respiratory:  Positive for shortness of breath. Negative for cough.   Cardiovascular:  Negative for chest pain.  Gastrointestinal:  Negative for abdominal pain and diarrhea.  Genitourinary:  Negative for frequency and hematuria.  Musculoskeletal:  Negative for back pain.  Skin:  Negative for rash.  Neurological:  Negative for seizures and headaches.  Psychiatric/Behavioral:  Negative for hallucinations.    Physical Exam Updated Vital Signs BP (!) 159/82   Pulse (!) 102   Temp 98.8 F (37.1 C) (Oral)   Resp (!) 31   Ht 5' 9"  (1.753 m)   Wt 89 kg   SpO2 99%   BMI 28.98 kg/m   Physical Exam Vitals and nursing note reviewed.  Constitutional:      Appearance: He is well-developed.  HENT:     Head: Normocephalic.     Nose: Nose normal.  Eyes:     General: No scleral icterus.    Conjunctiva/sclera: Conjunctivae normal.  Neck:     Thyroid: No thyromegaly.  Cardiovascular:     Rate and Rhythm: Normal rate and regular rhythm.     Heart sounds: No murmur heard.   No friction rub. No gallop.  Pulmonary:     Breath sounds: No stridor. No wheezing or rales.  Chest:  Chest wall: No tenderness.  Abdominal:     General: There is no distension.      Tenderness: There is no abdominal tenderness. There is no rebound.  Musculoskeletal:        General: Normal range of motion.     Cervical back: Neck supple.  Lymphadenopathy:     Cervical: No cervical adenopathy.  Skin:    Findings: No erythema or rash.  Neurological:     Mental Status: He is alert and oriented to person, place, and time.     Motor: No abnormal muscle tone.     Coordination: Coordination normal.  Psychiatric:        Behavior: Behavior normal.    ED Results / Procedures / Treatments   Labs (all labs ordered are listed, but only abnormal results are displayed) Labs Reviewed  BASIC METABOLIC PANEL - Abnormal; Notable for the following components:      Result Value   Sodium 132 (*)    Potassium 3.0 (*)    Chloride 91 (*)    Glucose, Bld 174 (*)    BUN 24 (*)    Calcium 8.0 (*)    All other components within normal limits  CBC    EKG EKG Interpretation  Date/Time:  Saturday April 27 2021 19:39:06 EST Ventricular Rate:  93 PR Interval:  170 QRS Duration: 100 QT Interval:  386 QTC Calculation: 479 R Axis:   65 Text Interpretation: Sinus rhythm with Premature atrial complexes with Abberant conduction Left ventricular hypertrophy with repolarization abnormality ( Sokolow-Lyon , Romhilt-Estes ) Abnormal ECG Confirmed by Milton Ferguson [phone]) on 04/27/2021 8:20:01 PM  Radiology DG Chest Port 1 View  Result Date: 04/27/2021 CLINICAL DATA:  Shortness of breath EXAM: PORTABLE CHEST 1 VIEW COMPARISON:  Chest x-ray 12/21/2020, CT chest 12/21/2020 FINDINGS: The heart and mediastinal contours are unchanged. Aortic calcification. No focal consolidation. Coarsened interstitial markings with no overt pulmonary edema. No pleural effusion. No pneumothorax. No acute osseous abnormality. IMPRESSION: No active disease. Electronically Signed   By: Iven Finn M.D.   On: 04/27/2021 21:21    Procedures Procedures   Medications Ordered in ED Medications   methylPREDNISolone sodium succinate (SOLU-MEDROL) 125 mg/2 mL injection 125 mg (125 mg Intramuscular Given 04/27/21 2038)  ipratropium-albuterol (DUONEB) 0.5-2.5 (3) MG/3ML nebulizer solution 3 mL (3 mLs Nebulization Given 04/27/21 2107)  albuterol (PROVENTIL) (2.5 MG/3ML) 0.083% nebulizer solution 2.5 mg (2.5 mg Nebulization Given 04/27/21 2107)  potassium chloride SA (KLOR-CON) CR tablet 40 mEq (40 mEq Oral Given 04/27/21 2123)    ED Course  I have reviewed the triage vital signs and the nursing notes.  Pertinent labs & imaging results that were available during my care of the patient were reviewed by me and considered in my medical decision making (see chart for details). CRITICAL CARE Performed by: Milton Ferguson Total critical care time: 40 minutes Critical care time was exclusive of separately billable procedures and treating other patients. Critical care was necessary to treat or prevent imminent or life-threatening deterioration. Critical care was time spent personally by me on the following activities: development of treatment plan with patient and/or surrogate as well as nursing, discussions with consultants, evaluation of patient's response to treatment, examination of patient, obtaining history from patient or surrogate, ordering and performing treatments and interventions, ordering and review of laboratory studies, ordering and review of radiographic studies, pulse oximetry and re-evaluation of patient's condition.    MDM Rules/Calculators/A&P  Patient with COPD exacerbation.  Patient needs admission to the hospital.  Patient refused admission to the hospital and understands that if he does not stay in the hospital he could become deathly ill.  Patient states he will return if he gets worse.  Patient was given doxycycline and prednisone prescriptions Final Clinical Impression(s) / ED Diagnoses Final diagnoses:  COPD exacerbation ([place])    Rx / DC  Orders ED Discharge Orders          Ordered    predniSONE (DELTASONE) 10 MG tablet  Daily        04/27/21 2143    doxycycline (VIBRAMYCIN) 100 MG capsule  2 times daily        04/27/21 2143             Milton Ferguson, MD 04/29/21 1037

## 2021-04-27 NOTE — ED Notes (Signed)
Pt given O2 tank from ED to take home. Pt agreed to bring tank back to ED

## 2021-04-27 NOTE — ED Triage Notes (Signed)
Pt present to ED from home with c/o SOB that started yesterday. Pt has COPD, reports using his nebs today w/o relief. Pt is on continuous oxygen @ 3 L- upon arrival pt did not have oxygen- says he forgot- pt O2 sats was 82% in triage- placed on 4L via [place] with improvement of O2 sats to 93%

## 2021-05-01 ENCOUNTER — Emergency Department (HOSPITAL_COMMUNITY): Payer: Medicare HMO

## 2021-05-01 ENCOUNTER — Inpatient Hospital Stay (HOSPITAL_COMMUNITY)
Admission: EM | Admit: 2021-05-01 | Discharge: 2021-06-06 | DRG: 207 | Disposition: A | Discharge status: Skilled Nursing Facility | Payer: Medicare HMO | Attending: Internal Medicine | Admitting: Internal Medicine

## 2021-05-01 ENCOUNTER — Encounter (HOSPITAL_COMMUNITY): Payer: Self-pay

## 2021-05-01 ENCOUNTER — Other Ambulatory Visit: Payer: Self-pay

## 2021-05-01 DIAGNOSIS — R0682 Tachypnea, not elsewhere classified: Secondary | ICD-10-CM

## 2021-05-01 DIAGNOSIS — I700 Atherosclerosis of aorta: Secondary | ICD-10-CM | POA: Diagnosis present

## 2021-05-01 DIAGNOSIS — Z9049 Acquired absence of other specified parts of digestive tract: Secondary | ICD-10-CM

## 2021-05-01 DIAGNOSIS — R778 Other specified abnormalities of plasma proteins: Secondary | ICD-10-CM | POA: Diagnosis not present

## 2021-05-01 DIAGNOSIS — F17210 Nicotine dependence, cigarettes, uncomplicated: Secondary | ICD-10-CM | POA: Diagnosis present

## 2021-05-01 DIAGNOSIS — I5032 Chronic diastolic (congestive) heart failure: Secondary | ICD-10-CM | POA: Diagnosis present

## 2021-05-01 DIAGNOSIS — R410 Disorientation, unspecified: Secondary | ICD-10-CM | POA: Diagnosis not present

## 2021-05-01 DIAGNOSIS — R404 Transient alteration of awareness: Secondary | ICD-10-CM | POA: Diagnosis not present

## 2021-05-01 DIAGNOSIS — Z955 Presence of coronary angioplasty implant and graft: Secondary | ICD-10-CM

## 2021-05-01 DIAGNOSIS — Z825 Family history of asthma and other chronic lower respiratory diseases: Secondary | ICD-10-CM

## 2021-05-01 DIAGNOSIS — E11649 Type 2 diabetes mellitus with hypoglycemia without coma: Secondary | ICD-10-CM | POA: Diagnosis not present

## 2021-05-01 DIAGNOSIS — K7200 Acute and subacute hepatic failure without coma: Secondary | ICD-10-CM | POA: Diagnosis not present

## 2021-05-01 DIAGNOSIS — N179 Acute kidney failure, unspecified: Secondary | ICD-10-CM | POA: Diagnosis present

## 2021-05-01 DIAGNOSIS — J9690 Respiratory failure, unspecified, unspecified whether with hypoxia or hypercapnia: Secondary | ICD-10-CM | POA: Diagnosis not present

## 2021-05-01 DIAGNOSIS — F32A Depression, unspecified: Secondary | ICD-10-CM | POA: Diagnosis present

## 2021-05-01 DIAGNOSIS — I110 Hypertensive heart disease with heart failure: Secondary | ICD-10-CM | POA: Diagnosis present

## 2021-05-01 DIAGNOSIS — J439 Emphysema, unspecified: Secondary | ICD-10-CM | POA: Diagnosis not present

## 2021-05-01 DIAGNOSIS — M25559 Pain in unspecified hip: Secondary | ICD-10-CM

## 2021-05-01 DIAGNOSIS — M25551 Pain in right hip: Secondary | ICD-10-CM | POA: Diagnosis not present

## 2021-05-01 DIAGNOSIS — Z4659 Encounter for fitting and adjustment of other gastrointestinal appliance and device: Secondary | ICD-10-CM

## 2021-05-01 DIAGNOSIS — G9341 Metabolic encephalopathy: Secondary | ICD-10-CM | POA: Diagnosis present

## 2021-05-01 DIAGNOSIS — R1310 Dysphagia, unspecified: Secondary | ICD-10-CM | POA: Diagnosis not present

## 2021-05-01 DIAGNOSIS — J9621 Acute and chronic respiratory failure with hypoxia: Secondary | ICD-10-CM | POA: Diagnosis present

## 2021-05-01 DIAGNOSIS — K648 Other hemorrhoids: Secondary | ICD-10-CM | POA: Diagnosis present

## 2021-05-01 DIAGNOSIS — R0902 Hypoxemia: Secondary | ICD-10-CM | POA: Diagnosis not present

## 2021-05-01 DIAGNOSIS — I6931 Attention and concentration deficit following cerebral infarction: Secondary | ICD-10-CM | POA: Diagnosis not present

## 2021-05-01 DIAGNOSIS — Z8249 Family history of ischemic heart disease and other diseases of the circulatory system: Secondary | ICD-10-CM

## 2021-05-01 DIAGNOSIS — E669 Obesity, unspecified: Secondary | ICD-10-CM | POA: Diagnosis present

## 2021-05-01 DIAGNOSIS — E875 Hyperkalemia: Secondary | ICD-10-CM | POA: Diagnosis not present

## 2021-05-01 DIAGNOSIS — R000 Tachycardia, unspecified: Secondary | ICD-10-CM | POA: Diagnosis not present

## 2021-05-01 DIAGNOSIS — I5030 Unspecified diastolic (congestive) heart failure: Secondary | ICD-10-CM | POA: Diagnosis not present

## 2021-05-01 DIAGNOSIS — Z931 Gastrostomy status: Secondary | ICD-10-CM | POA: Diagnosis not present

## 2021-05-01 DIAGNOSIS — Z91199 Patient's noncompliance with other medical treatment and regimen due to unspecified reason: Secondary | ICD-10-CM

## 2021-05-01 DIAGNOSIS — N289 Disorder of kidney and ureter, unspecified: Secondary | ICD-10-CM | POA: Diagnosis not present

## 2021-05-01 DIAGNOSIS — E1140 Type 2 diabetes mellitus with diabetic neuropathy, unspecified: Secondary | ICD-10-CM | POA: Diagnosis present

## 2021-05-01 DIAGNOSIS — J9602 Acute respiratory failure with hypercapnia: Secondary | ICD-10-CM | POA: Diagnosis not present

## 2021-05-01 DIAGNOSIS — J9600 Acute respiratory failure, unspecified whether with hypoxia or hypercapnia: Secondary | ICD-10-CM | POA: Diagnosis present

## 2021-05-01 DIAGNOSIS — J101 Influenza due to other identified influenza virus with other respiratory manifestations: Secondary | ICD-10-CM

## 2021-05-01 DIAGNOSIS — E440 Moderate protein-calorie malnutrition: Secondary | ICD-10-CM | POA: Insufficient documentation

## 2021-05-01 DIAGNOSIS — D649 Anemia, unspecified: Secondary | ICD-10-CM | POA: Diagnosis not present

## 2021-05-01 DIAGNOSIS — K219 Gastro-esophageal reflux disease without esophagitis: Secondary | ICD-10-CM | POA: Diagnosis present

## 2021-05-01 DIAGNOSIS — E7800 Pure hypercholesterolemia, unspecified: Secondary | ICD-10-CM | POA: Diagnosis present

## 2021-05-01 DIAGNOSIS — Z7982 Long term (current) use of aspirin: Secondary | ICD-10-CM

## 2021-05-01 DIAGNOSIS — F1320 Sedative, hypnotic or anxiolytic dependence, uncomplicated: Secondary | ICD-10-CM

## 2021-05-01 DIAGNOSIS — R52 Pain, unspecified: Secondary | ICD-10-CM

## 2021-05-01 DIAGNOSIS — M1711 Unilateral primary osteoarthritis, right knee: Secondary | ICD-10-CM | POA: Diagnosis not present

## 2021-05-01 DIAGNOSIS — R451 Restlessness and agitation: Secondary | ICD-10-CM | POA: Diagnosis not present

## 2021-05-01 DIAGNOSIS — N401 Enlarged prostate with lower urinary tract symptoms: Secondary | ICD-10-CM | POA: Diagnosis present

## 2021-05-01 DIAGNOSIS — R0600 Dyspnea, unspecified: Secondary | ICD-10-CM

## 2021-05-01 DIAGNOSIS — Z4682 Encounter for fitting and adjustment of non-vascular catheter: Secondary | ICD-10-CM | POA: Diagnosis not present

## 2021-05-01 DIAGNOSIS — I10 Essential (primary) hypertension: Secondary | ICD-10-CM | POA: Diagnosis not present

## 2021-05-01 DIAGNOSIS — J810 Acute pulmonary edema: Secondary | ICD-10-CM

## 2021-05-01 DIAGNOSIS — R1319 Other dysphagia: Secondary | ICD-10-CM | POA: Diagnosis present

## 2021-05-01 DIAGNOSIS — Z978 Presence of other specified devices: Secondary | ICD-10-CM

## 2021-05-01 DIAGNOSIS — Z20822 Contact with and (suspected) exposure to covid-19: Secondary | ICD-10-CM | POA: Diagnosis present

## 2021-05-01 DIAGNOSIS — N281 Cyst of kidney, acquired: Secondary | ICD-10-CM | POA: Diagnosis not present

## 2021-05-01 DIAGNOSIS — M79604 Pain in right leg: Secondary | ICD-10-CM | POA: Diagnosis not present

## 2021-05-01 DIAGNOSIS — J449 Chronic obstructive pulmonary disease, unspecified: Secondary | ICD-10-CM | POA: Diagnosis not present

## 2021-05-01 DIAGNOSIS — M1611 Unilateral primary osteoarthritis, right hip: Secondary | ICD-10-CM | POA: Diagnosis not present

## 2021-05-01 DIAGNOSIS — I4892 Unspecified atrial flutter: Secondary | ICD-10-CM | POA: Diagnosis not present

## 2021-05-01 DIAGNOSIS — I4891 Unspecified atrial fibrillation: Secondary | ICD-10-CM | POA: Diagnosis present

## 2021-05-01 DIAGNOSIS — J9622 Acute and chronic respiratory failure with hypercapnia: Secondary | ICD-10-CM | POA: Diagnosis not present

## 2021-05-01 DIAGNOSIS — Z6827 Body mass index (BMI) 27.0-27.9, adult: Secondary | ICD-10-CM

## 2021-05-01 DIAGNOSIS — I252 Old myocardial infarction: Secondary | ICD-10-CM

## 2021-05-01 DIAGNOSIS — J80 Acute respiratory distress syndrome: Secondary | ICD-10-CM | POA: Diagnosis not present

## 2021-05-01 DIAGNOSIS — J398 Other specified diseases of upper respiratory tract: Secondary | ICD-10-CM | POA: Diagnosis not present

## 2021-05-01 DIAGNOSIS — F419 Anxiety disorder, unspecified: Secondary | ICD-10-CM

## 2021-05-01 DIAGNOSIS — K5730 Diverticulosis of large intestine without perforation or abscess without bleeding: Secondary | ICD-10-CM | POA: Diagnosis present

## 2021-05-01 DIAGNOSIS — Z716 Tobacco abuse counseling: Secondary | ICD-10-CM

## 2021-05-01 DIAGNOSIS — M7989 Other specified soft tissue disorders: Secondary | ICD-10-CM

## 2021-05-01 DIAGNOSIS — R7989 Other specified abnormal findings of blood chemistry: Secondary | ICD-10-CM | POA: Diagnosis present

## 2021-05-01 DIAGNOSIS — M25751 Osteophyte, right hip: Secondary | ICD-10-CM | POA: Diagnosis not present

## 2021-05-01 DIAGNOSIS — J9601 Acute respiratory failure with hypoxia: Principal | ICD-10-CM

## 2021-05-01 DIAGNOSIS — Z79899 Other long term (current) drug therapy: Secondary | ICD-10-CM

## 2021-05-01 DIAGNOSIS — R0602 Shortness of breath: Secondary | ICD-10-CM | POA: Diagnosis not present

## 2021-05-01 DIAGNOSIS — J441 Chronic obstructive pulmonary disease with (acute) exacerbation: Principal | ICD-10-CM | POA: Diagnosis present

## 2021-05-01 DIAGNOSIS — I70201 Unspecified atherosclerosis of native arteries of extremities, right leg: Secondary | ICD-10-CM | POA: Diagnosis not present

## 2021-05-01 DIAGNOSIS — R6520 Severe sepsis without septic shock: Secondary | ICD-10-CM

## 2021-05-01 DIAGNOSIS — Z0189 Encounter for other specified special examinations: Secondary | ICD-10-CM

## 2021-05-01 DIAGNOSIS — N2889 Other specified disorders of kidney and ureter: Secondary | ICD-10-CM | POA: Diagnosis not present

## 2021-05-01 DIAGNOSIS — Z431 Encounter for attention to gastrostomy: Secondary | ICD-10-CM | POA: Diagnosis not present

## 2021-05-01 DIAGNOSIS — K921 Melena: Secondary | ICD-10-CM | POA: Diagnosis not present

## 2021-05-01 DIAGNOSIS — E639 Nutritional deficiency, unspecified: Secondary | ICD-10-CM | POA: Diagnosis not present

## 2021-05-01 DIAGNOSIS — I2510 Atherosclerotic heart disease of native coronary artery without angina pectoris: Secondary | ICD-10-CM | POA: Diagnosis not present

## 2021-05-01 DIAGNOSIS — R579 Shock, unspecified: Secondary | ICD-10-CM | POA: Diagnosis present

## 2021-05-01 DIAGNOSIS — Z743 Need for continuous supervision: Secondary | ICD-10-CM | POA: Diagnosis not present

## 2021-05-01 DIAGNOSIS — R638 Other symptoms and signs concerning food and fluid intake: Secondary | ICD-10-CM | POA: Diagnosis not present

## 2021-05-01 DIAGNOSIS — D62 Acute posthemorrhagic anemia: Secondary | ICD-10-CM | POA: Diagnosis not present

## 2021-05-01 DIAGNOSIS — Z01818 Encounter for other preprocedural examination: Secondary | ICD-10-CM

## 2021-05-01 DIAGNOSIS — A419 Sepsis, unspecified organism: Secondary | ICD-10-CM

## 2021-05-01 DIAGNOSIS — E278 Other specified disorders of adrenal gland: Secondary | ICD-10-CM | POA: Diagnosis not present

## 2021-05-01 DIAGNOSIS — R338 Other retention of urine: Secondary | ICD-10-CM | POA: Diagnosis present

## 2021-05-01 DIAGNOSIS — Z888 Allergy status to other drugs, medicaments and biological substances status: Secondary | ICD-10-CM

## 2021-05-01 DIAGNOSIS — G4733 Obstructive sleep apnea (adult) (pediatric): Secondary | ICD-10-CM | POA: Diagnosis present

## 2021-05-01 DIAGNOSIS — I509 Heart failure, unspecified: Secondary | ICD-10-CM

## 2021-05-01 DIAGNOSIS — Z7901 Long term (current) use of anticoagulants: Secondary | ICD-10-CM

## 2021-05-01 DIAGNOSIS — G8929 Other chronic pain: Secondary | ICD-10-CM | POA: Diagnosis present

## 2021-05-01 DIAGNOSIS — Z9981 Dependence on supplemental oxygen: Secondary | ICD-10-CM

## 2021-05-01 DIAGNOSIS — N3281 Overactive bladder: Secondary | ICD-10-CM | POA: Diagnosis present

## 2021-05-01 DIAGNOSIS — I517 Cardiomegaly: Secondary | ICD-10-CM | POA: Diagnosis not present

## 2021-05-01 DIAGNOSIS — R079 Chest pain, unspecified: Secondary | ICD-10-CM | POA: Diagnosis not present

## 2021-05-01 DIAGNOSIS — R918 Other nonspecific abnormal finding of lung field: Secondary | ICD-10-CM | POA: Diagnosis not present

## 2021-05-01 DIAGNOSIS — Z7952 Long term (current) use of systemic steroids: Secondary | ICD-10-CM

## 2021-05-01 DIAGNOSIS — M79651 Pain in right thigh: Secondary | ICD-10-CM | POA: Diagnosis not present

## 2021-05-01 LAB — URINALYSIS, ROUTINE W REFLEX MICROSCOPIC
Bilirubin Urine: NEGATIVE
Glucose, UA: 100 mg/dL — AB
Ketones, ur: NEGATIVE mg/dL
Leukocytes,Ua: NEGATIVE
Nitrite: NEGATIVE
Protein, ur: >300 mg/dL — AB
Specific Gravity, Urine: >1.03 — ABNORMAL HIGH (ref 1.005–1.030)
pH: 6 (ref 5.0–8.0)

## 2021-05-01 LAB — CBC WITH DIFFERENTIAL/PLATELET
Abs Immature Granulocytes: 0.23 10*3/uL — ABNORMAL HIGH (ref 0.00–0.07)
Basophils Absolute: 0.1 10*3/uL (ref 0.0–0.1)
Basophils Relative: 1 %
Eosinophils Absolute: 0.4 10*3/uL (ref 0.0–0.5)
Eosinophils Relative: 4 %
HCT: 50.4 % (ref 39.0–52.0)
Hemoglobin: 16.7 g/dL (ref 13.0–17.0)
Immature Granulocytes: 2 %
Lymphocytes Relative: 5 %
Lymphs Abs: 0.7 10*3/uL (ref 0.7–4.0)
MCH: 29.2 pg (ref 26.0–34.0)
MCHC: 33.1 g/dL (ref 30.0–36.0)
MCV: 88.3 fL (ref 80.0–100.0)
Monocytes Absolute: 2.2 10*3/uL — ABNORMAL HIGH (ref 0.1–1.0)
Monocytes Relative: 18 %
Neutro Abs: 8.5 10*3/uL — ABNORMAL HIGH (ref 1.7–7.7)
Neutrophils Relative %: 70 %
Platelets: 355 10*3/uL (ref 150–400)
RBC: 5.71 MIL/uL (ref 4.22–5.81)
RDW: 12.8 % (ref 11.5–15.5)
WBC: 12.1 10*3/uL — ABNORMAL HIGH (ref 4.0–10.5)
nRBC: 0 % (ref 0.0–0.2)

## 2021-05-01 LAB — COMPREHENSIVE METABOLIC PANEL
ALT: 179 U/L — ABNORMAL HIGH (ref 0–44)
AST: 184 U/L — ABNORMAL HIGH (ref 15–41)
Albumin: 3.9 g/dL (ref 3.5–5.0)
Alkaline Phosphatase: 94 U/L (ref 38–126)
Anion gap: 8 (ref 5–15)
BUN: 44 mg/dL — ABNORMAL HIGH (ref 8–23)
CO2: 36 mmol/L — ABNORMAL HIGH (ref 22–32)
Calcium: 8.8 mg/dL — ABNORMAL LOW (ref 8.9–10.3)
Chloride: 91 mmol/L — ABNORMAL LOW (ref 98–111)
Creatinine, Ser: 1.13 mg/dL (ref 0.61–1.24)
GFR, Estimated: >60 mL/min (ref >60)
Glucose, Bld: 282 mg/dL — ABNORMAL HIGH (ref 70–99)
Potassium: 3.5 mmol/L (ref 3.5–5.1)
Sodium: 135 mmol/L (ref 135–145)
Total Bilirubin: 1.1 mg/dL (ref 0.3–1.2)
Total Protein: 7.7 g/dL (ref 6.5–8.1)

## 2021-05-01 LAB — BLOOD GAS, ARTERIAL
Acid-Base Excess: 2.7 mmol/L — ABNORMAL HIGH (ref 0.0–2.0)
Acid-Base Excess: 2.8 mmol/L — ABNORMAL HIGH (ref 0.0–2.0)
Bicarbonate: 23 mmol/L (ref 20.0–28.0)
Bicarbonate: 25.1 mmol/L (ref 20.0–28.0)
Drawn by: 27733
Drawn by: 27733
FIO2: 100
FIO2: 100
O2 Saturation: 80.3 %
O2 Saturation: 99.2 %
Patient temperature: 36.7
Patient temperature: 36.6
pCO2 arterial: 87.1 mmHg (ref 32.0–48.0)
pCO2 arterial: 63.6 mmHg — ABNORMAL HIGH (ref 32.0–48.0)
pH, Arterial: 7.171 — CL (ref 7.350–7.450)
pH, Arterial: 7.279 — ABNORMAL LOW (ref 7.350–7.450)
pO2, Arterial: 57.3 mmHg — ABNORMAL LOW (ref 83.0–108.0)
pO2, Arterial: 414 mmHg — ABNORMAL HIGH (ref 83.0–108.0)

## 2021-05-01 LAB — LACTIC ACID, PLASMA
Lactic Acid, Venous: 3.1 mmol/L (ref 0.5–1.9)
Lactic Acid, Venous: 2.1 mmol/L (ref 0.5–1.9)

## 2021-05-01 LAB — POCT I-STAT 7, (LYTES, BLD GAS, ICA,H+H)
Acid-Base Excess: 2 mmol/L (ref 0.0–2.0)
Bicarbonate: 30.3 mmol/L — ABNORMAL HIGH (ref 20.0–28.0)
Calcium, Ion: 1.15 mmol/L (ref 1.15–1.40)
HCT: 42 % (ref 39.0–52.0)
Hemoglobin: 14.3 g/dL (ref 13.0–17.0)
O2 Saturation: 99 %
Patient temperature: 98.8
Potassium: 3.6 mmol/L (ref 3.5–5.1)
Sodium: 134 mmol/L — ABNORMAL LOW (ref 135–145)
TCO2: 32 mmol/L (ref 22–32)
pCO2 arterial: 61 mmHg — ABNORMAL HIGH (ref 32.0–48.0)
pH, Arterial: 7.305 — ABNORMAL LOW (ref 7.350–7.450)
pO2, Arterial: 146 mmHg — ABNORMAL HIGH (ref 83.0–108.0)

## 2021-05-01 LAB — TROPONIN I (HIGH SENSITIVITY)
Troponin I (High Sensitivity): 193 ng/L (ref <18)
Troponin I (High Sensitivity): 181 ng/L (ref <18)

## 2021-05-01 LAB — GLUCOSE, CAPILLARY: Glucose-Capillary: 363 mg/dL — ABNORMAL HIGH (ref 70–99)

## 2021-05-01 LAB — CBG MONITORING, ED: Glucose-Capillary: 278 mg/dL — ABNORMAL HIGH (ref 70–99)

## 2021-05-01 LAB — URINALYSIS, MICROSCOPIC (REFLEX)

## 2021-05-01 LAB — CORTISOL: Cortisol, Plasma: >100 ug/dL

## 2021-05-01 LAB — BRAIN NATRIURETIC PEPTIDE: B Natriuretic Peptide: 1654 pg/mL — ABNORMAL HIGH (ref 0.0–100.0)

## 2021-05-01 LAB — PROCALCITONIN: Procalcitonin: 0.29 ng/mL

## 2021-05-01 LAB — RESP PANEL BY RT-PCR (FLU A&B, COVID) ARPGX2
Influenza A by PCR: POSITIVE — AB
Influenza B by PCR: NEGATIVE
SARS Coronavirus 2 by RT PCR: NEGATIVE

## 2021-05-01 MED ORDER — FENTANYL BOLUS VIA INFUSION
25.0000 ug | INTRAVENOUS | Status: DC | PRN
  Filled 2021-05-01: qty 100

## 2021-05-01 MED ORDER — ROCURONIUM BROMIDE 10 MG/ML (PF) SYRINGE
PREFILLED_SYRINGE | INTRAVENOUS | Status: AC
  Filled 2021-05-01: qty 10

## 2021-05-01 MED ORDER — ALBUTEROL SULFATE (2.5 MG/3ML) 0.083% IN NEBU
2.5000 mg | INHALATION_SOLUTION | RESPIRATORY_TRACT | Status: DC | PRN
  Administered 2021-05-03 – 2021-05-08 (×2): 2.5 mg via RESPIRATORY_TRACT
  Filled 2021-05-01 (×2): qty 3

## 2021-05-01 MED ORDER — SODIUM CHLORIDE 0.9 % IV SOLN
2.0000 g | Freq: Once | INTRAVENOUS | Status: AC
  Administered 2021-05-01: 2 g via INTRAVENOUS
  Filled 2021-05-01: qty 2

## 2021-05-01 MED ORDER — OSELTAMIVIR PHOSPHATE 75 MG PO CAPS
75.0000 mg | ORAL_CAPSULE | Freq: Two times a day (BID) | ORAL | Status: DC
  Administered 2021-05-01 – 2021-05-03 (×4): 75 mg
  Filled 2021-05-01 (×4): qty 1

## 2021-05-01 MED ORDER — FENTANYL BOLUS VIA INFUSION
25.0000 ug | INTRAVENOUS | Status: DC | PRN
  Administered 2021-05-02 – 2021-05-03 (×2): 100 ug via INTRAVENOUS
  Filled 2021-05-01: qty 100

## 2021-05-01 MED ORDER — FAMOTIDINE IN NACL 20-0.9 MG/50ML-% IV SOLN
20.0000 mg | Freq: Two times a day (BID) | INTRAVENOUS | Status: DC
  Administered 2021-05-01: 20 mg via INTRAVENOUS
  Filled 2021-05-01 (×2): qty 50

## 2021-05-01 MED ORDER — SODIUM CHLORIDE 0.9 % IV SOLN
250.0000 mL | INTRAVENOUS | Status: DC
  Administered 2021-05-01 – 2021-05-17 (×5): 250 mL via INTRAVENOUS
  Administered 2021-06-01: 15:00:00 1000 mL via INTRAVENOUS

## 2021-05-01 MED ORDER — ETOMIDATE 2 MG/ML IV SOLN
20.0000 mg | Freq: Once | INTRAVENOUS | Status: AC
  Administered 2021-05-01: 20 mg via INTRAVENOUS

## 2021-05-01 MED ORDER — POLYETHYLENE GLYCOL 3350 17 G PO PACK: 17.0000 g | PACK | Freq: Every day | ORAL | Status: DC | PRN

## 2021-05-01 MED ORDER — IPRATROPIUM-ALBUTEROL 0.5-2.5 (3) MG/3ML IN SOLN
3.0000 mL | Freq: Four times a day (QID) | RESPIRATORY_TRACT | Status: DC
  Administered 2021-05-01 – 2021-05-15 (×55): 3 mL via RESPIRATORY_TRACT
  Filled 2021-05-01 (×55): qty 3

## 2021-05-01 MED ORDER — INSULIN ASPART 100 UNIT/ML IJ SOLN
0.0000 [IU] | INTRAMUSCULAR | Status: DC
  Administered 2021-05-01: 15 [IU] via SUBCUTANEOUS
  Administered 2021-05-02 (×2): 8 [IU] via SUBCUTANEOUS
  Administered 2021-05-02: 3 [IU] via SUBCUTANEOUS
  Administered 2021-05-02: 5 [IU] via SUBCUTANEOUS
  Administered 2021-05-02: 3 [IU] via SUBCUTANEOUS
  Administered 2021-05-03: 5 [IU] via SUBCUTANEOUS
  Administered 2021-05-03 (×3): 2 [IU] via SUBCUTANEOUS
  Administered 2021-05-03: 5 [IU] via SUBCUTANEOUS
  Administered 2021-05-03: 8 [IU] via SUBCUTANEOUS
  Administered 2021-05-04: 5 [IU] via SUBCUTANEOUS
  Administered 2021-05-04 (×2): 3 [IU] via SUBCUTANEOUS
  Administered 2021-05-04: 5 [IU] via SUBCUTANEOUS
  Administered 2021-05-04: 8 [IU] via SUBCUTANEOUS
  Administered 2021-05-04 (×2): 3 [IU] via SUBCUTANEOUS
  Administered 2021-05-05: 16:00:00 11 [IU] via SUBCUTANEOUS
  Administered 2021-05-05: 04:00:00 3 [IU] via SUBCUTANEOUS
  Administered 2021-05-05: 12:00:00 5 [IU] via SUBCUTANEOUS
  Administered 2021-05-05: 20:00:00 8 [IU] via SUBCUTANEOUS
  Administered 2021-05-05 – 2021-05-06 (×2): 3 [IU] via SUBCUTANEOUS
  Administered 2021-05-06: 8 [IU] via SUBCUTANEOUS
  Administered 2021-05-06 (×2): 5 [IU] via SUBCUTANEOUS
  Administered 2021-05-06: 12:00:00 8 [IU] via SUBCUTANEOUS
  Administered 2021-05-06 – 2021-05-07 (×2): 5 [IU] via SUBCUTANEOUS
  Administered 2021-05-07: 8 [IU] via SUBCUTANEOUS
  Administered 2021-05-07: 3 [IU] via SUBCUTANEOUS
  Administered 2021-05-07 (×2): 8 [IU] via SUBCUTANEOUS
  Administered 2021-05-07 – 2021-05-08 (×3): 3 [IU] via SUBCUTANEOUS
  Administered 2021-05-08: 8 [IU] via SUBCUTANEOUS
  Administered 2021-05-08 (×3): 3 [IU] via SUBCUTANEOUS
  Administered 2021-05-09 (×3): 2 [IU] via SUBCUTANEOUS
  Administered 2021-05-09: 3 [IU] via SUBCUTANEOUS
  Administered 2021-05-10: 2 [IU] via SUBCUTANEOUS
  Administered 2021-05-11: 5 [IU] via SUBCUTANEOUS
  Administered 2021-05-11: 2 [IU] via SUBCUTANEOUS
  Administered 2021-05-11 (×3): 3 [IU] via SUBCUTANEOUS
  Administered 2021-05-11 (×2): 5 [IU] via SUBCUTANEOUS
  Administered 2021-05-12: 20:00:00 11 [IU] via SUBCUTANEOUS
  Administered 2021-05-12 (×2): 5 [IU] via SUBCUTANEOUS
  Administered 2021-05-12: 08:00:00 3 [IU] via SUBCUTANEOUS
  Administered 2021-05-13 (×2): 8 [IU] via SUBCUTANEOUS
  Administered 2021-05-13: 3 [IU] via SUBCUTANEOUS
  Administered 2021-05-13: 8 [IU] via SUBCUTANEOUS
  Administered 2021-05-13: 3 [IU] via SUBCUTANEOUS
  Administered 2021-05-13: 5 [IU] via SUBCUTANEOUS
  Administered 2021-05-13: 3 [IU] via SUBCUTANEOUS
  Administered 2021-05-14: 11 [IU] via SUBCUTANEOUS
  Administered 2021-05-14: 8 [IU] via SUBCUTANEOUS
  Administered 2021-05-14: 2 [IU] via SUBCUTANEOUS
  Administered 2021-05-14: 8 [IU] via SUBCUTANEOUS
  Administered 2021-05-14: 5 [IU] via SUBCUTANEOUS
  Administered 2021-05-15 (×2): 8 [IU] via SUBCUTANEOUS
  Administered 2021-05-15 (×2): 2 [IU] via SUBCUTANEOUS
  Administered 2021-05-16: 3 [IU] via SUBCUTANEOUS
  Administered 2021-05-16: 5 [IU] via SUBCUTANEOUS
  Administered 2021-05-16: 8 [IU] via SUBCUTANEOUS

## 2021-05-01 MED ORDER — OSELTAMIVIR PHOSPHATE 6 MG/ML PO SUSR
75.0000 mg | Freq: Two times a day (BID) | ORAL | Status: DC
  Filled 2021-05-01: qty 12.5

## 2021-05-01 MED ORDER — SODIUM CHLORIDE 0.9 % IV SOLN
1.0000 g | INTRAVENOUS | Status: DC
  Administered 2021-05-01: 1 g via INTRAVENOUS
  Filled 2021-05-01: qty 10

## 2021-05-01 MED ORDER — ONDANSETRON HCL 4 MG/2ML IJ SOLN: 4.0000 mg | Freq: Four times a day (QID) | INTRAMUSCULAR | Status: DC | PRN

## 2021-05-01 MED ORDER — DEXMEDETOMIDINE HCL IN NACL 400 MCG/100ML IV SOLN
0.0000 ug/kg/h | INTRAVENOUS | Status: DC
  Administered 2021-05-01 – 2021-05-02 (×3): 0.4 ug/kg/h via INTRAVENOUS
  Administered 2021-05-03: 0.8 ug/kg/h via INTRAVENOUS
  Administered 2021-05-03: 1 ug/kg/h via INTRAVENOUS
  Filled 2021-05-01 (×7): qty 100

## 2021-05-01 MED ORDER — AMIODARONE HCL IN DEXTROSE 360-4.14 MG/200ML-% IV SOLN
30.0000 mg/h | INTRAVENOUS | Status: DC
  Administered 2021-05-01 – 2021-05-04 (×6): 30 mg/h via INTRAVENOUS
  Filled 2021-05-01 (×6): qty 200

## 2021-05-01 MED ORDER — ETOMIDATE 2 MG/ML IV SOLN
INTRAVENOUS | Status: AC
  Filled 2021-05-01: qty 10

## 2021-05-01 MED ORDER — DOCUSATE SODIUM 100 MG PO CAPS: 100.0000 mg | ORAL_CAPSULE | Freq: Two times a day (BID) | ORAL | Status: DC | PRN

## 2021-05-01 MED ORDER — FENTANYL 2500MCG IN NS 250ML (10MCG/ML) PREMIX INFUSION
25.0000 ug/h | INTRAVENOUS | Status: DC
  Administered 2021-05-01: 25 ug/h via INTRAVENOUS
  Filled 2021-05-01: qty 250

## 2021-05-01 MED ORDER — METHYLPREDNISOLONE SODIUM SUCC 125 MG IJ SOLR: 60.0000 mg | Freq: Four times a day (QID) | INTRAMUSCULAR | Status: DC

## 2021-05-01 MED ORDER — SODIUM CHLORIDE 0.9 % IV SOLN: 2.0000 g | Freq: Three times a day (TID) | INTRAVENOUS | Status: DC

## 2021-05-01 MED ORDER — HEPARIN (PORCINE) 25000 UT/250ML-% IV SOLN
1450.0000 [IU]/h | INTRAVENOUS | Status: AC
  Administered 2021-05-01 – 2021-05-06 (×6): 1250 [IU]/h via INTRAVENOUS
  Administered 2021-05-07: 1500 [IU]/h via INTRAVENOUS
  Administered 2021-05-07: 1600 [IU]/h via INTRAVENOUS
  Administered 2021-05-08 – 2021-05-09 (×2): 1500 [IU]/h via INTRAVENOUS
  Administered 2021-05-10: 1450 [IU]/h via INTRAVENOUS
  Administered 2021-05-10: 02:00:00 1500 [IU]/h via INTRAVENOUS
  Administered 2021-05-11 – 2021-05-12 (×2): 1450 [IU]/h via INTRAVENOUS
  Filled 2021-05-01 (×14): qty 250

## 2021-05-01 MED ORDER — ASPIRIN EC 325 MG PO TBEC
325.0000 mg | DELAYED_RELEASE_TABLET | Freq: Every day | ORAL | Status: DC
  Filled 2021-05-01: qty 1

## 2021-05-01 MED ORDER — LACTATED RINGERS IV SOLN: INTRAVENOUS | Status: DC

## 2021-05-01 MED ORDER — EPINEPHRINE PF 1 MG/ML IJ SOLN
INTRAMUSCULAR | Status: AC
  Filled 2021-05-01: qty 1

## 2021-05-01 MED ORDER — PANTOPRAZOLE SODIUM 40 MG IV SOLR
40.0000 mg | Freq: Two times a day (BID) | INTRAVENOUS | Status: DC
  Administered 2021-05-01 (×2): 40 mg via INTRAVENOUS
  Filled 2021-05-01 (×3): qty 40

## 2021-05-01 MED ORDER — NOREPINEPHRINE 4 MG/250ML-% IV SOLN
2.0000 ug/min | INTRAVENOUS | Status: DC
  Administered 2021-05-01: 10 ug/min via INTRAVENOUS
  Filled 2021-05-01: qty 250

## 2021-05-01 MED ORDER — FENTANYL CITRATE PF 50 MCG/ML IJ SOSY: 50.0000 ug | PREFILLED_SYRINGE | INTRAMUSCULAR | Status: DC | PRN

## 2021-05-01 MED ORDER — ENOXAPARIN SODIUM 40 MG/0.4ML IJ SOSY
40.0000 mg | PREFILLED_SYRINGE | INTRAMUSCULAR | Status: DC
  Administered 2021-05-01: 40 mg via SUBCUTANEOUS
  Filled 2021-05-01: qty 0.4

## 2021-05-01 MED ORDER — SODIUM CHLORIDE 0.9 % IV SOLN
100.0000 mg | Freq: Two times a day (BID) | INTRAVENOUS | Status: DC
  Administered 2021-05-01: 100 mg via INTRAVENOUS
  Filled 2021-05-01 (×3): qty 100

## 2021-05-01 MED ORDER — AMIODARONE LOAD VIA INFUSION
150.0000 mg | Freq: Once | INTRAVENOUS | Status: AC
  Administered 2021-05-01: 150 mg via INTRAVENOUS
  Filled 2021-05-01: qty 83.34

## 2021-05-01 MED ORDER — FENTANYL CITRATE (PF) 100 MCG/2ML IJ SOLN
25.0000 ug | Freq: Once | INTRAMUSCULAR | Status: AC
  Administered 2021-05-01: 25 ug via INTRAVENOUS
  Filled 2021-05-01: qty 2

## 2021-05-01 MED ORDER — AMIODARONE HCL IN DEXTROSE 360-4.14 MG/200ML-% IV SOLN
60.0000 mg/h | INTRAVENOUS | Status: AC
  Administered 2021-05-01: 60 mg/h via INTRAVENOUS
  Filled 2021-05-01 (×2): qty 200

## 2021-05-01 MED ORDER — FENTANYL CITRATE PF 50 MCG/ML IJ SOSY: 25.0000 ug | PREFILLED_SYRINGE | Freq: Once | INTRAMUSCULAR | Status: DC

## 2021-05-01 MED ORDER — FENTANYL 2500MCG IN NS 250ML (10MCG/ML) PREMIX INFUSION
25.0000 ug/h | INTRAVENOUS | Status: DC
  Administered 2021-05-02: 100 ug/h via INTRAVENOUS
  Administered 2021-05-03: 150 ug/h via INTRAVENOUS
  Filled 2021-05-01 (×2): qty 250

## 2021-05-01 MED ORDER — METHYLPREDNISOLONE SODIUM SUCC 125 MG IJ SOLR
80.0000 mg | Freq: Once | INTRAMUSCULAR | Status: AC
  Administered 2021-05-01: 80 mg via INTRAVENOUS
  Filled 2021-05-01: qty 2

## 2021-05-01 MED ORDER — ACETAMINOPHEN 325 MG PO TABS: 650.0000 mg | ORAL_TABLET | ORAL | Status: DC | PRN

## 2021-05-01 MED ORDER — PROPOFOL 1000 MG/100ML IV EMUL
INTRAVENOUS | Status: AC
  Administered 2021-05-01: 5 ug/kg/min via INTRAVENOUS
  Filled 2021-05-01: qty 100

## 2021-05-01 MED ORDER — METHYLPREDNISOLONE SODIUM SUCC 125 MG IJ SOLR
120.0000 mg | Freq: Two times a day (BID) | INTRAMUSCULAR | Status: DC
  Administered 2021-05-01 – 2021-05-02 (×2): 120 mg via INTRAVENOUS
  Filled 2021-05-01 (×2): qty 2

## 2021-05-01 MED ORDER — ROCURONIUM BROMIDE 50 MG/5ML IV SOLN
75.0000 mg | Freq: Once | INTRAVENOUS | Status: AC
  Administered 2021-05-01: 75 mg via INTRAVENOUS

## 2021-05-01 MED ORDER — SODIUM CHLORIDE 0.9 % IV BOLUS
1000.0000 mL | Freq: Once | INTRAVENOUS | Status: AC
  Administered 2021-05-01: 1000 mL via INTRAVENOUS

## 2021-05-01 MED ORDER — PROPOFOL 1000 MG/100ML IV EMUL
0.0000 ug/kg/min | INTRAVENOUS | Status: DC
Start: 2021-05-01 — End: 2021-05-03
  Administered 2021-05-03: 5 ug/kg/min via INTRAVENOUS
  Filled 2021-05-01 (×3): qty 100

## 2021-05-01 MED ORDER — SODIUM CHLORIDE 0.9 % IV SOLN
500.0000 mg | INTRAVENOUS | Status: DC
  Administered 2021-05-01 – 2021-05-03 (×3): 500 mg via INTRAVENOUS
  Filled 2021-05-01 (×3): qty 500

## 2021-05-01 MED ORDER — MIDAZOLAM HCL 2 MG/2ML IJ SOLN
2.0000 mg | Freq: Once | INTRAMUSCULAR | Status: AC
  Administered 2021-05-01: 2 mg via INTRAVENOUS
  Filled 2021-05-01: qty 2

## 2021-05-01 MED ORDER — DOCUSATE SODIUM 50 MG/5ML PO LIQD
100.0000 mg | Freq: Two times a day (BID) | ORAL | Status: DC
  Administered 2021-05-01 – 2021-05-03 (×4): 100 mg
  Filled 2021-05-01 (×4): qty 10

## 2021-05-01 MED ORDER — POLYETHYLENE GLYCOL 3350 17 G PO PACK
17.0000 g | PACK | Freq: Every day | ORAL | Status: DC
  Administered 2021-05-03: 17 g
  Filled 2021-05-01: qty 1

## 2021-05-01 NOTE — ED Notes (Signed)
Pt transferred to [facility] via [place].  Team given personal belongings.

## 2021-05-01 NOTE — Progress Notes (Signed)
Pharmacy Antibiotic Note  Ronald [name] is a 75 y.o. male admitted on 05/01/2021 with pneumonia.  Pharmacy has been consulted for cefepime dosing.  Plan: Cefepime 2gm IV q8h Also on doxycyline 100mg  IV q12h F/u cx and clinical progress Monitor V/S, labs    No data recorded.  Recent Labs  Lab 04/27/21 2031  WBC 10.1  CREATININE 1.13    Estimated Creatinine Clearance: 64.2 mL/min (by C-G formula based on SCr of 1.13 mg/dL).    Allergies  Allergen Reactions   Gabapentin Anxiety    Unknown reaction   Metformin And Related Rash    Antimicrobials this admission: cefepime 11/23 >>  Doxycycline 11/23  >>   Microbiology results: 11/23 BCx: pending 11/23 UCx: pending   MRSA PCR:   Thank you for allowing pharmacy to be a part of this patient's care.  Isac Sarna, BS [place], [place] Clinical Pharmacist Pager [phone] 05/01/2021 1:43 PM

## 2021-05-01 NOTE — Progress Notes (Signed)
ANTICOAGULATION CONSULT NOTE - Initial Consult  Pharmacy Consult for IV Heparin Indication:  Atrial Fibrillation  Allergies  Allergen Reactions   Gabapentin Anxiety    Unknown reaction   Metformin And Related Rash    Patient Measurements: Total Body Weight: 89 kg Height: 69 inches Heparin Dosing Weight: 89 kg  Vital Signs: Temp: 98.8 F (37.1 C) (11/23 1930) Temp Source: Oral (11/23 1930) BP: 90/61 (11/23 1921) Pulse Rate: 76 (11/23 1930)  Labs: Recent Labs    05/01/21 1310 05/01/21 1639  HGB 16.7  --   HCT 50.4  --   PLT 355  --   CREATININE 1.13  --   TROPONINIHS 193* 181*    Estimated Creatinine Clearance: 64.2 mL/min (by C-G formula based on SCr of 1.13 mg/dL).   Medical History: Past Medical History:  Diagnosis Date   Anxiety    Asthma    Chronic lower back pain    COPD (chronic obstructive pulmonary disease) (HCC)    Coronary artery disease    a. NSTEMI 05/2014 s/p DESx2 to LAD at [facility].   Depression    Educated about COVID-19 virus infection 03/06/2020   GERD (gastroesophageal reflux disease)    High cholesterol    Hypertension    NSTEMI (non-ST elevated myocardial infarction) ([place]) 05/2014   with stent placement   Sleep apnea    Stroke ([place]) 2017   anyeusym    TIA (transient ischemic attack)    "they say I've had some mini strokes; don't know when"; denies residual on 06/22/2014)   Type II diabetes mellitus ([place])    Ulcerative colitis ([place])     Assessment: 75 yr old man presented to [facility] with acute respiratory failure, acute on chronic COPD, asthma, pneumonia, chronic dysphagia, circulatory shock, hx diastolic CHF, hx CAD, HLD, HTN, smoking hx, DM2, GERD, hx of anxiety. Pt now transferred to [facility] Pt with atrial flutter with V rate 130. Pharmacy is consulted to dose IV heparin for a fib. Pt was on no anticoagulant PTA. He rec'd a dose of Lovenox 40 mg for VTE prophylaxis at 1554 PM today.  H/H 16.7/50.4, plt 355  (CBC WNL)  Goal of Therapy:  Heparin level 0.3-0.7 units/ml Monitor platelets by anticoagulation protocol: Yes   Plan:  Start heparin infusion at 1250 units/hr Check heparin level in ~7 hrs Monitor daily heparin level, CBC Monitor for bleeding  Gillermina Hu, PharmD, BCPS, [facility] Clinical Pharmacist 05/01/2021,8:20 PM

## 2021-05-01 NOTE — Progress Notes (Signed)
Amaryllis Dyke [phone] cell phone [name] Kinnear [phone] phone Came to visit patient

## 2021-05-01 NOTE — ED Notes (Signed)
Report given to [facility]. 

## 2021-05-01 NOTE — ED Notes (Addendum)
Patient intubated by Dr. Pearline Cables with 8.0 ETT secured via commercial tube holder. Placement confirmed by xray.

## 2021-05-01 NOTE — ED Triage Notes (Signed)
Patient on BiPap by EMS initial GCS of 15 upon arrival GCS of 8. SOB discharged on 19th from hospital worsening SOB today EMS called by wife.
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
## 2021-05-01 NOTE — Progress Notes (Signed)
20 g X 1.88 Ultrasound placed I.V. per protocol for pressor. Catheter tip marked for iwatch placement. RN at bedside.

## 2021-05-01 NOTE — Progress Notes (Signed)
[facility] Progress Note Patient Name: Ronald [name] DOB: Nov 01, 1945 MRN: [id number]   Date of Service  05/01/2021  HPI/Events of Note  Patient arrived from [facility]. Intubated with settings of PRVC VT 600, RR 12, Peep 5, Fio2 60, O2 sat is 97. No distress on camera. HR in 110-114 range, SBP in 75 range, on fentanyl, amiodarone and levophed infusions.   eICU Interventions  Had been seen by bedside team. May need a CVC due to access issues. Is on Lovenox prophylaxis as well.      Intervention Category Major Interventions: Respiratory failure - evaluation and management Evaluation Type: New Patient Evaluation  Margaretmary Lombard 05/01/2021, 7:38 PM
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
## 2021-05-01 NOTE — ED Provider Notes (Signed)
[facility] EMERGENCY DEPARTMENT Provider Note   CSN: [id number] Arrival date & time: 05/01/21  1252     History Chief Complaint  Patient presents with   Respiratory Distress    Ronald [name] is a 75 y.o. male.  This is a 75 y.o. male with significant medical history as below, including COPD, HTN, HLD who presents to the ED with complaint of resp distress.   Per EMS patient with severe COPD, variable compliance of medications, current tobacco use with significant respiratory distress this morning which prompted EMS call.  Patient unable to verbalize history secondary to severe respiratory stress.  He was given steroids and duo nebs by EMS and placed on CPAP as he had no significant improvement to respiratory status intervention  Level 5 caveat severe resp distress  The history is provided by the EMS personnel. The history is limited by the condition of the patient. No language interpreter was used.      Past Medical History:  Diagnosis Date   Anxiety    Asthma    Chronic lower back pain    COPD (chronic obstructive pulmonary disease) (HCC)    Coronary artery disease    a. NSTEMI 05/2014 s/p DESx2 to LAD at [facility].   Depression    Educated about COVID-19 virus infection 03/06/2020   GERD (gastroesophageal reflux disease)    High cholesterol    Hypertension    NSTEMI (non-ST elevated myocardial infarction) ([place]) 05/2014   with stent placement   Sleep apnea    Stroke ([place]) 2017   anyeusym    TIA (transient ischemic attack)    "they say I've had some mini strokes; don't know when"; denies residual on 06/22/2014)   Type II diabetes mellitus ([place])    Ulcerative colitis [facility])     Patient Active Problem List   Diagnosis Date Noted   Atrial flutter ([place]) 05/01/2021   Shock circulatory ([place]) 05/01/2021   Acute respiratory failure ([place]) 05/01/2021   Elevated brain natriuretic peptide (BNP) level 12/21/2020   Elevated troponin 12/21/2020   Hypokalemia 12/21/2020    Hyperglycemia due to diabetes mellitus ([place]) 12/21/2020   Hyperlipidemia 12/21/2020   Acute on chronic respiratory failure with hypoxia ([place]) 12/21/2020   Chest pain 05/23/2020   Controlled substance agreement signed 04/03/2020   Benzodiazepine dependence ([place]) 04/03/2020   Dysphagia 02/27/2020   Tracheal stenosis 12/09/2019   Noncompliance 04/01/2019   Lymphedema 04/01/2019   CHF (congestive heart failure) ([place]) 08/27/2018   Aortic atherosclerosis ([place]) 05/11/2017   Depression 05/05/2017   GERD (gastroesophageal reflux disease) 01/16/2017   Diabetic neuropathy ([place]) 01/16/2017   Anterolisthesis 12/04/2016   History of MI (myocardial infarction) 12/04/2016   OSA (obstructive sleep apnea) 12/04/2016   Closed fracture dislocation of lumbar spine ([place]) 07/25/2016   Closed fracture of body of sternum 07/25/2016   Multiple fractures of cervical spine ([place]) 07/25/2016   Tachycardia 07/25/2016   Tachypnea 07/25/2016   Anxiety 02/14/2016   Chronic respiratory failure with hypoxia ([place]) 07/19/2014   Essential hypertension 07/19/2014   Cigarette smoker 07/19/2014   Chest pain at rest 06/22/2014   CAD in native artery 06/22/2014   Diabetes mellitus ([place]) 06/22/2014   COPD (chronic obstructive pulmonary disease) ([place]) 06/22/2014   Tobacco abuse 06/22/2014   Acute encephalopathy 06/22/2014   Diabetes mellitus type 2 in obese ([place]) 05/11/2014   Simple chronic bronchitis ([place]) 05/11/2014    Past Surgical History:  Procedure Laterality Date   APPENDECTOMY     BIOPSY  07/20/2020  Procedure: BIOPSY;  Surgeon: Harvel Quale, MD;  Location: [facility] ENDO SUITE;  Service: Gastroenterology;;   CARDIAC CATHETERIZATION  [phone]   CHOLECYSTECTOMY     COLONOSCOPY WITH PROPOFOL N/A 07/20/2020   Procedure: COLONOSCOPY WITH PROPOFOL;  Surgeon: Harvel Quale, MD;  Location: [facility] ENDO SUITE;  Service: Gastroenterology;  Laterality: N/A;  1:15   CORONARY ANGIOPLASTY WITH STENT  PLACEMENT  05/2014   "2"   ESOPHAGEAL DILATION N/A 07/20/2020   Procedure: ESOPHAGEAL DILATION;  Surgeon: Harvel Quale, MD;  Location: [facility] ENDO SUITE;  Service: Gastroenterology;  Laterality: N/A;   ESOPHAGOGASTRODUODENOSCOPY (EGD) WITH PROPOFOL N/A 07/20/2020   Procedure: ESOPHAGOGASTRODUODENOSCOPY (EGD) WITH PROPOFOL;  Surgeon: Harvel Quale, MD;  Location: [facility] SUITE;  Service: Gastroenterology;  Laterality: N/A;   LEFT HEART CATH AND CORONARY ANGIOGRAPHY N/A 05/25/2020   Procedure: LEFT HEART CATH AND CORONARY ANGIOGRAPHY;  Surgeon: Martinique, Peter M, MD;  Location: [facility];  Service: Cardiovascular;  Laterality: N/A;   POLYPECTOMY  07/20/2020   Procedure: POLYPECTOMY INTESTINAL;  Surgeon: Harvel Quale, MD;  Location: [facility];  Service: Gastroenterology;;   TUMOR EXCISION Right ~ 1999   "side of my upper head"       Family History  Problem Relation Age of Onset   CAD Father    Lung cancer Brother        smoked   Cancer Brother        lung   Leukemia Sister    Dementia Sister    Stroke Mother    Emphysema Sister    Cancer Brother        lung    Social History   Tobacco Use   Smoking status: Every Day    Packs/day: 1.50    Years: 48.00    Pack years: 72.00    Types: Cigarettes    Start date: 02/12/1966   Smokeless tobacco: Never   Tobacco comments:    smokes 5-6 cigarettes per day 06/07/2020  Vaping Use   Vaping Use: Never used  Substance Use Topics   Alcohol use: Not Currently    Alcohol/week: 0.0 standard drinks   Drug use: No    Home Medications Prior to Admission medications   Medication Sig Start Date End Date Taking? Authorizing Provider  albuterol (VENTOLIN HFA) 108 (90 Base) MCG/ACT inhaler INHALE 2 PUFFS EVERY 6 HOURS AS NEEEDED 11/02/19   Hawks, Alyse Low A, FNP  ALPRAZolam Duanne Moron) 0.5 MG tablet Take 1 tablet (0.5 mg total) by mouth 2 (two) times daily as needed. for anxiety 01/17/21   [name] A,  FNP  amLODipine (NORVASC) 5 MG tablet Take 1 tablet (5 mg total) by mouth daily. 02/04/21   [name] A, FNP  Ascorbic Acid (VITAMIN C) 1000 MG tablet Take 1,000 mg by mouth daily.    [provider]  aspirin EC 81 MG tablet Take 1 tablet (81 mg total) by mouth daily. 10/15/18   Herminio Commons, MD  atorvastatin (LIPITOR) 40 MG tablet TAKE 1 TABLET BY MOUTH ONCE DAILY IN THE EVENING 12/05/20   [name] A, FNP  Blood Glucose Monitoring Suppl (ACCU-CHEK GUIDE ME) w/Device KIT Check BS daily Dx E11.9 05/16/20   [name] A, FNP  cholecalciferol (VITAMIN D3) 25 MCG (1000 UNIT) tablet Take 1,000 Units by mouth daily.    [provider]  doxycycline (VIBRAMYCIN) 100 MG capsule Take 1 capsule (100 mg total) by mouth 2 (two) times daily. One po  bid x 7 days 04/27/21   Milton Ferguson, MD  escitalopram (LEXAPRO) 20 MG tablet Take 1 tablet (20 mg total) by mouth daily. 02/12/21   [name], FNP  fluticasone (FLONASE) 50 MCG/ACT nasal spray Place 2 sprays into both nostrils daily. 11/15/20   Gwenlyn Perking, FNP  furosemide (LASIX) 20 MG tablet Take 1 tablet (20 mg total) by mouth 2 (two) times daily. 01/01/21   [name] A, FNP  glucose blood (ONE TOUCH ULTRA TEST) test strip USE TO CHECK GLUCOSE ONCE DAILY 02/11/18   Terald Sleeper, PA-C  guaiFENesin (REFENESEN 400 PO) Take 2 tablets by mouth daily as needed (mucus).    [provider]  ipratropium-albuterol (DUONEB) 0.5-2.5 (3) MG/3ML SOLN USE 1 AMPULE IN NEBULIZER 4 TIMES DAILY 04/29/21   [name] A, FNP  isosorbide mononitrate (IMDUR) 30 MG 24 hr tablet Take 1 tablet (30 mg total) by mouth daily. 04/09/21   Arnoldo Lenis, MD  Lancets [facility] ULTRASOFT) lancets Use as instructed   DX E11.9 05/05/17   [name], FNP  losartan (COZAAR) 100 MG tablet Take 1 tablet (100 mg total) by mouth daily. 02/05/21   [name] A, FNP  metoprolol tartrate (LOPRESSOR) 50 MG tablet Take 1 tablet (50 mg  total) by mouth 2 (two) times daily. 03/04/21   [name], FNP  mirtazapine (REMERON) 15 MG tablet Take 1 tablet (15 mg total) by mouth at bedtime. 01/01/21   [name] A, FNP  nicotine (NICODERM CQ - DOSED IN MG/24 HOURS) 14 mg/24hr patch Place 1 patch (14 mg total) onto the skin daily. 06/14/20   [name] A, FNP  OXYGEN Inhale 3 L into the lungs daily. 24 hours a day    [provider]  pantoprazole (PROTONIX) 40 MG tablet Take 1 tablet by mouth once daily 11/26/20   [name] A, FNP  predniSONE (DELTASONE) 10 MG tablet Take 2 tablets (20 mg total) by mouth daily. 04/27/21   Milton Ferguson, MD  Vibegron (GEMTESA) 75 MG TABS Take 75 mg by mouth daily. 04/18/21   Irine Seal, MD    Allergies    Gabapentin and Metformin and related  Review of Systems   Review of Systems  Unable to perform ROS: Severe respiratory distress   Physical Exam Updated Vital Signs BP (!) 155/101   Pulse (!) 115   Temp (!) 96.6 F (35.9 C)   Resp 12   SpO2 100%   Physical Exam Constitutional:      General: He is in acute distress.     Appearance: He is obese. He is ill-appearing and diaphoretic.  HENT:     Head: Normocephalic and atraumatic.     Right Ear: External ear normal.     Left Ear: External ear normal.     Nose: Nose normal.  Eyes:     General: No scleral icterus.       Right eye: No discharge.        Left eye: No discharge.  Cardiovascular:     Rate and Rhythm: Regular rhythm. Tachycardia present.     Pulses: Normal pulses.  Pulmonary:     Effort: Tachypnea, accessory muscle usage, respiratory distress and retractions present.     Breath sounds: Decreased air movement present. No stridor. Decreased breath sounds and wheezing present.     Comments: Intercostal retractions Abdominal:     General: Abdomen is flat.     Palpations: Abdomen is soft.  Tenderness: There is no abdominal tenderness.  Musculoskeletal:     Cervical back: Normal range of motion.   Skin:    General: Skin is cool.     Capillary Refill: Capillary refill takes more than 3 seconds.     Comments: Extremities appear mottled  Neurological:     Mental Status: He is lethargic.     GCS: GCS eye subscore is 2. GCS verbal subscore is 1. GCS motor subscore is 4.    ED Results / Procedures / Treatments   Labs (all labs ordered are listed, but only abnormal results are displayed) Labs Reviewed  RESP PANEL BY RT-PCR (FLU A&B, COVID) ARPGX2 - Abnormal; Notable for the following components:      Result Value   Influenza A by PCR POSITIVE (*)    All other components within normal limits  BRAIN NATRIURETIC PEPTIDE - Abnormal; Notable for the following components:   B Natriuretic Peptide 1,654.0 (*)    All other components within normal limits  COMPREHENSIVE METABOLIC PANEL - Abnormal; Notable for the following components:   Chloride 91 (*)    CO2 36 (*)    Glucose, Bld 282 (*)    BUN 44 (*)    Calcium 8.8 (*)    AST 184 (*)    ALT 179 (*)    All other components within normal limits  CBC WITH DIFFERENTIAL/PLATELET - Abnormal; Notable for the following components:   WBC 12.1 (*)    Neutro Abs 8.5 (*)    Monocytes Absolute 2.2 (*)    Abs Immature Granulocytes 0.23 (*)    All other components within normal limits  BLOOD GAS, ARTERIAL - Abnormal; Notable for the following components:   pH, Arterial 7.171 (*)    pCO2 arterial 87.1 (*)    pO2, Arterial 57.3 (*)    Acid-Base Excess 2.7 (*)    All other components within normal limits  LACTIC ACID, PLASMA - Abnormal; Notable for the following components:   Lactic Acid, Venous 3.1 (*)    All other components within normal limits  CBG MONITORING, ED - Abnormal; Notable for the following components:   Glucose-Capillary 278 (*)    All other components within normal limits  TROPONIN I (HIGH SENSITIVITY) - Abnormal; Notable for the following components:   Troponin I (High Sensitivity) 193 (*)    All other components within  normal limits  CULTURE, BLOOD (ROUTINE X 2)  CULTURE, BLOOD (ROUTINE X 2)  PROCALCITONIN  LACTIC ACID, PLASMA  CORTISOL  BLOOD GAS, ARTERIAL  URINALYSIS, ROUTINE W REFLEX MICROSCOPIC  TROPONIN I (HIGH SENSITIVITY)    EKG None  Radiology DG Chest Port 1 View  Result Date: 05/01/2021 CLINICAL DATA:  Shortness of breath, COPD EXAM: PORTABLE CHEST 1 VIEW COMPARISON:  04/27/2021 FINDINGS: Transverse diameter of heart is within normal limits. Low position of diaphragms suggests COPD. Increased interstitial markings are seen in the left lower lung fields. There are no signs of alveolar pulmonary edema. There is peribronchial thickening. There is blunting of left lateral CP angle. There is no pneumothorax. Tip of endotracheal tube is 1.8 cm above the carina and should be pulled back 2-3 cm. Enteric tube is noted traversing the esophagus. IMPRESSION: Increased interstitial markings are seen in the left lower lung fields suggesting pneumonia. Possible small left pleural effusion. COPD. Tip of endotracheal tube is 1.8 cm above the carina and should be pulled back 2-3 cm. These results will be called to the ordering clinician or representative by the  Psychologist, clinical, and communication documented in the PACS or [employer]. Electronically Signed   By: Elmer Picker M.D.   On: 05/01/2021 13:30    Procedures .Critical Care Performed by: Jeanell Sparrow, DO Authorized by: Jeanell Sparrow, DO   Critical care provider statement:    Critical care time (minutes):  72   Critical care time was exclusive of:  Separately billable procedures and treating other patients   Critical care was necessary to treat or prevent imminent or life-threatening deterioration of the following conditions:  Cardiac failure, sepsis, shock and respiratory failure   Critical care was time spent personally by me on the following activities:  Development of treatment plan with patient or surrogate, discussions with  consultants, evaluation of patient's response to treatment, examination of patient, ordering and review of laboratory studies, ordering and review of radiographic studies, ordering and performing treatments and interventions, pulse oximetry, re-evaluation of patient's condition and review of old charts   Care discussed with: admitting provider   Procedure Name: Intubation Date/Time: 05/01/2021 3:48 PM Performed by: Jeanell Sparrow, DO Pre-anesthesia Checklist: Patient identified, Patient being monitored, Emergency Drugs available, Suction available and Timeout performed Oxygen Delivery Method: Ambu bag Preoxygenation: Pre-oxygenation with 100% oxygen Induction Type: Rapid sequence Ventilation: Two handed mask ventilation required Laryngoscope Size: Mac, 4 and Glidescope Grade View: Grade III Tube size: 8.0 mm Number of attempts: 1 Placement Confirmation: ETT inserted through vocal cords under direct vision, Positive ETCO2, CO2 detector and Breath sounds checked- equal and bilateral Secured at: 26 cm Tube secured with: ETT holder Dental Injury: Teeth and Oropharynx as per pre-operative assessment  Difficulty Due To: Difficulty was anticipated, Difficult Airway- due to reduced neck mobility, Difficult Airway-  due to edematous airway and Difficult Airway- due to limited oral opening Future Recommendations: Recommend- induction with short-acting agent, and alternative techniques readily available      Medications Ordered in ED Medications  propofol (DIPRIVAN) 1000 MG/100ML infusion (0 mcg/kg/min  89 kg Intravenous Stopped 05/01/21 1418)  amiodarone (NEXTERONE) 1.8 mg/mL load via infusion 150 mg (150 mg Intravenous Bolus from Bag 05/01/21 1357)    Followed by  amiodarone (NEXTERONE PREMIX) 360-4.14 MG/200ML-% (1.8 mg/mL) IV infusion (60 mg/hr Intravenous New Bag/Given 05/01/21 1457)    Followed by  amiodarone (NEXTERONE PREMIX) 360-4.14 MG/200ML-% (1.8 mg/mL) IV infusion (has no  administration in time range)  0.9 %  sodium chloride infusion (0 mLs Intravenous Stopped 05/01/21 1343)  norepinephrine (LEVOPHED) 59m in 2525mpremix infusion (10 mcg/min Intravenous Rate/Dose Change 05/01/21 1409)  fentaNYL (SUBLIMAZE) injection 25 mcg (25 mcg Intravenous Not Given 05/01/21 1458)  fentaNYL 250061min NS 250m74m0mc26m) infusion-PREMIX (25 mcg/hr Intravenous New Bag/Given 05/01/21 1339)  fentaNYL (SUBLIMAZE) bolus via infusion 25-100 mcg (has no administration in time range)  ceFEPIme (MAXIPIME) 2 g in sodium chloride 0.9 % 100 mL IVPB (has no administration in time range)  doxycycline (VIBRAMYCIN) 100 mg in sodium chloride 0.9 % 250 mL IVPB (100 mg Intravenous New Bag/Given 05/01/21 1530)  ceFEPIme (MAXIPIME) 2 g in sodium chloride 0.9 % 100 mL IVPB (has no administration in time range)  pantoprazole (PROTONIX) injection 40 mg (has no administration in time range)  enoxaparin (LOVENOX) injection 40 mg (has no administration in time range)  etomidate (AMIDATE) injection 20 mg (20 mg Intravenous Given 05/01/21 1256)  rocuronium (ZEMURON) injection 75 mg (75 mg Intravenous Given 05/01/21 1317)  methylPREDNISolone sodium succinate (SOLU-MEDROL) 125 mg/2 mL injection 80 mg (80 mg Intravenous Given 05/01/21 1342)  rocuronium bromide 100 MG/10ML SOSY (  Given 05/01/21 1256)  etomidate (AMIDATE) 2 MG/ML injection (  Given 05/01/21 1256)    ED Course  I have reviewed the triage vital signs and the nursing notes.  Pertinent labs & imaging results that were available during my care of the patient were reviewed by me and considered in my medical decision making (see chart for details).  Clinical Course as of 05/01/21 1554  Wed May 01, 2021  1327 D/w PCCM Dr Melvyn Novas, recommends txfr to [facility]; I would agree, patient is critically ill  [SG]    Clinical Course User Index [SG] Jeanell Sparrow, DO   MDM Rules/Calculators/A&P                           CC: resp failure  This patient  complains of above; this involves an extensive number of treatment options and is a complaint that carries with it a high risk of complications and morbidity. Vital signs were reviewed. Serious etiologies considered.  Record review:  Previous records obtained and reviewed   Additional history obtained from EMS  Work up as above, notable for:  Labs & imaging results that were available during my care of the patient were reviewed by me and considered in my medical decision making.   I ordered imaging studies which included CXR and I independently visualized and interpreted imaging which showed diffuse edema, pos PNA  Management: Patient with acute respiratory failure with hypoxia and hypercapnia, intubated secondary to severe respiratory distress not amenable to BiPAP.  Patient with concern for significant pulmonary edema, COPD exacerbation versus community-acquired pneumonia.  He was given steroids, started on broad-spectrum antibiotics.  Patient is hypotensive, started on peripheral Levophed with moderate improvement to blood pressure. Concern for sepsis. Auto-peep is likely a factor as well in hemodynamics, will adjust vent and troubleshoot   ETT was deep, still above carina, this was performed as pt does have stoma. Will defer to PCCM if they wish to change to depth of ETT, oxygenating well currently.   Patient with wide-complex tachycardia noted on telemetry/ a flutter.  EKG not crossing over to MUSE. Started on amiodarone, heart rate improving but remains in what appears to be flutter.  Blood pressure improving while in peripheral levophed, will titrate down as tolerated and may be able to discontinue in next 12 hrs.   Discussed with critical care Dr. Melvyn Novas, recommend we initiate transfer to [facility] for escalation of care.   D/w PCCM Dr Ardis Hughs at [facility] who accepts pt to ICU at [facility]       This chart was dictated using voice recognition software.  Despite best efforts to proofread,   errors can occur which can change the documentation meaning.  Final Clinical Impression(s) / ED Diagnoses Final diagnoses:  Encounter for intubation  Acute respiratory failure with hypoxia and hypercapnia (HCC)  Wide-complex tachycardia  Acute pulmonary edema (HCC)  Influenza A  Shock liver  Sepsis with acute hypoxic respiratory failure, due to unspecified organism, unspecified whether septic shock present [facility])    Rx / DC Orders ED Discharge Orders     None        Jeanell Sparrow, DO 05/01/21 1554

## 2021-05-02 DIAGNOSIS — J9602 Acute respiratory failure with hypercapnia: Secondary | ICD-10-CM | POA: Diagnosis not present

## 2021-05-02 LAB — COMPREHENSIVE METABOLIC PANEL
ALT: 141 U/L — ABNORMAL HIGH (ref 0–44)
ALT: 130 U/L — ABNORMAL HIGH (ref 0–44)
AST: 140 U/L — ABNORMAL HIGH (ref 15–41)
AST: 95 U/L — ABNORMAL HIGH (ref 15–41)
Albumin: 2.8 g/dL — ABNORMAL LOW (ref 3.5–5.0)
Albumin: 2.8 g/dL — ABNORMAL LOW (ref 3.5–5.0)
Alkaline Phosphatase: 82 U/L (ref 38–126)
Alkaline Phosphatase: 78 U/L (ref 38–126)
Anion gap: 8 (ref 5–15)
Anion gap: 9 (ref 5–15)
BUN: 40 mg/dL — ABNORMAL HIGH (ref 8–23)
BUN: 40 mg/dL — ABNORMAL HIGH (ref 8–23)
CO2: 28 mmol/L (ref 22–32)
CO2: 27 mmol/L (ref 22–32)
Calcium: 8.1 mg/dL — ABNORMAL LOW (ref 8.9–10.3)
Calcium: 8.2 mg/dL — ABNORMAL LOW (ref 8.9–10.3)
Chloride: 100 mmol/L (ref 98–111)
Chloride: 101 mmol/L (ref 98–111)
Creatinine, Ser: 1.31 mg/dL — ABNORMAL HIGH (ref 0.61–1.24)
Creatinine, Ser: 1.31 mg/dL — ABNORMAL HIGH (ref 0.61–1.24)
GFR, Estimated: 57 mL/min — ABNORMAL LOW (ref >60)
GFR, Estimated: 57 mL/min — ABNORMAL LOW (ref >60)
Glucose, Bld: 177 mg/dL — ABNORMAL HIGH (ref 70–99)
Glucose, Bld: 131 mg/dL — ABNORMAL HIGH (ref 70–99)
Potassium: 3 mmol/L — ABNORMAL LOW (ref 3.5–5.1)
Potassium: 3.1 mmol/L — ABNORMAL LOW (ref 3.5–5.1)
Sodium: 136 mmol/L (ref 135–145)
Sodium: 137 mmol/L (ref 135–145)
Total Bilirubin: 0.6 mg/dL (ref 0.3–1.2)
Total Bilirubin: 1 mg/dL (ref 0.3–1.2)
Total Protein: 5.6 g/dL — ABNORMAL LOW (ref 6.5–8.1)
Total Protein: 5.9 g/dL — ABNORMAL LOW (ref 6.5–8.1)

## 2021-05-02 LAB — CBC
HCT: 42.2 % (ref 39.0–52.0)
Hemoglobin: 14.2 g/dL (ref 13.0–17.0)
MCH: 28.3 pg (ref 26.0–34.0)
MCHC: 33.6 g/dL (ref 30.0–36.0)
MCV: 84.2 fL (ref 80.0–100.0)
Platelets: 242 10*3/uL (ref 150–400)
RBC: 5.01 MIL/uL (ref 4.22–5.81)
RDW: 12.8 % (ref 11.5–15.5)
WBC: 5 10*3/uL (ref 4.0–10.5)
nRBC: 0 % (ref 0.0–0.2)

## 2021-05-02 LAB — POCT I-STAT 7, (LYTES, BLD GAS, ICA,H+H)
Acid-Base Excess: 3 mmol/L — ABNORMAL HIGH (ref 0.0–2.0)
Bicarbonate: 29.2 mmol/L — ABNORMAL HIGH (ref 20.0–28.0)
Calcium, Ion: 1.18 mmol/L (ref 1.15–1.40)
HCT: 40 % (ref 39.0–52.0)
Hemoglobin: 13.6 g/dL (ref 13.0–17.0)
O2 Saturation: 96 %
Patient temperature: 37.8
Potassium: 2.9 mmol/L — ABNORMAL LOW (ref 3.5–5.1)
Sodium: 140 mmol/L (ref 135–145)
TCO2: 31 mmol/L (ref 22–32)
pCO2 arterial: 52.1 mmHg — ABNORMAL HIGH (ref 32.0–48.0)
pH, Arterial: 7.36 (ref 7.350–7.450)
pO2, Arterial: 93 mmHg (ref 83.0–108.0)

## 2021-05-02 LAB — GLUCOSE, CAPILLARY
Glucose-Capillary: 112 mg/dL — ABNORMAL HIGH (ref 70–99)
Glucose-Capillary: 161 mg/dL — ABNORMAL HIGH (ref 70–99)
Glucose-Capillary: 174 mg/dL — ABNORMAL HIGH (ref 70–99)
Glucose-Capillary: 217 mg/dL — ABNORMAL HIGH (ref 70–99)
Glucose-Capillary: 259 mg/dL — ABNORMAL HIGH (ref 70–99)
Glucose-Capillary: 260 mg/dL — ABNORMAL HIGH (ref 70–99)
Glucose-Capillary: 265 mg/dL — ABNORMAL HIGH (ref 70–99)

## 2021-05-02 LAB — HEPARIN LEVEL (UNFRACTIONATED)
Heparin Unfractionated: 0.66 IU/mL (ref 0.30–0.70)
Heparin Unfractionated: 0.57 IU/mL (ref 0.30–0.70)

## 2021-05-02 LAB — HEMOGLOBIN A1C
Hgb A1c MFr Bld: 7.5 % — ABNORMAL HIGH (ref 4.8–5.6)
Mean Plasma Glucose: 168.55 mg/dL

## 2021-05-02 LAB — MRSA NEXT GEN BY PCR, NASAL: MRSA by PCR Next Gen: NOT DETECTED

## 2021-05-02 LAB — PROCALCITONIN: Procalcitonin: 0.41 ng/mL

## 2021-05-02 LAB — LACTIC ACID, PLASMA: Lactic Acid, Venous: 2.1 mmol/L (ref 0.5–1.9)

## 2021-05-02 LAB — MAGNESIUM: Magnesium: 1.8 mg/dL (ref 1.7–2.4)

## 2021-05-02 MED ORDER — POTASSIUM CHLORIDE 10 MEQ/100ML IV SOLN
10.0000 meq | INTRAVENOUS | Status: AC
  Administered 2021-05-02 (×4): 10 meq via INTRAVENOUS
  Filled 2021-05-02 (×4): qty 100

## 2021-05-02 MED ORDER — METOPROLOL TARTRATE 25 MG PO TABS
25.0000 mg | ORAL_TABLET | Freq: Two times a day (BID) | ORAL | Status: DC
  Administered 2021-05-02: 25 mg
  Filled 2021-05-02: qty 1

## 2021-05-02 MED ORDER — ASPIRIN 81 MG PO CHEW
81.0000 mg | CHEWABLE_TABLET | Freq: Every day | ORAL | Status: DC
  Administered 2021-05-03 – 2021-05-08 (×6): 81 mg
  Filled 2021-05-02 (×6): qty 1

## 2021-05-02 MED ORDER — METHYLPREDNISOLONE SODIUM SUCC 125 MG IJ SOLR
80.0000 mg | INTRAMUSCULAR | Status: AC
  Administered 2021-05-03 – 2021-05-07 (×5): 80 mg via INTRAVENOUS
  Filled 2021-05-02 (×5): qty 2

## 2021-05-02 MED ORDER — ASPIRIN 325 MG PO TABS
325.0000 mg | ORAL_TABLET | Freq: Every day | ORAL | Status: DC
  Administered 2021-05-02: 325 mg
  Filled 2021-05-02: qty 1

## 2021-05-02 MED ORDER — CHLORHEXIDINE GLUCONATE 0.12% ORAL RINSE (MEDLINE KIT)
15.0000 mL | Freq: Two times a day (BID) | OROMUCOSAL | Status: DC
  Administered 2021-05-02 – 2021-05-03 (×3): 15 mL via OROMUCOSAL

## 2021-05-02 MED ORDER — ORAL CARE MOUTH RINSE
15.0000 mL | OROMUCOSAL | Status: DC
  Administered 2021-05-02 – 2021-05-03 (×17): 15 mL via OROMUCOSAL

## 2021-05-02 MED ORDER — POLYETHYLENE GLYCOL 3350 17 G PO PACK: 17.0000 g | PACK | Freq: Every day | ORAL | Status: DC | PRN

## 2021-05-02 MED ORDER — ACETAMINOPHEN 325 MG PO TABS
650.0000 mg | ORAL_TABLET | ORAL | Status: DC | PRN
  Administered 2021-05-02 – 2021-05-07 (×3): 650 mg
  Filled 2021-05-02 (×3): qty 2

## 2021-05-02 MED ORDER — ASPIRIN EC 81 MG PO TBEC: 81.0000 mg | DELAYED_RELEASE_TABLET | Freq: Every day | ORAL | Status: DC

## 2021-05-02 MED ORDER — CHLORHEXIDINE GLUCONATE CLOTH 2 % EX PADS
6.0000 | MEDICATED_PAD | Freq: Every day | CUTANEOUS | Status: DC
  Administered 2021-05-02 – 2021-06-06 (×37): 6 via TOPICAL

## 2021-05-02 MED ORDER — POTASSIUM CHLORIDE 10 MEQ/50ML IV SOLN: 10.0000 meq | INTRAVENOUS | Status: DC

## 2021-05-02 MED ORDER — ASPIRIN 81 MG PO CHEW
81.0000 mg | CHEWABLE_TABLET | Freq: Every day | ORAL | Status: DC
Start: 2021-05-02 — End: 2021-05-02

## 2021-05-02 MED ORDER — PANTOPRAZOLE SODIUM 40 MG IV SOLR
40.0000 mg | INTRAVENOUS | Status: DC
  Administered 2021-05-02: 40 mg via INTRAVENOUS
  Filled 2021-05-02: qty 40

## 2021-05-02 MED ORDER — METOPROLOL TARTRATE 25 MG PO TABS
25.0000 mg | ORAL_TABLET | Freq: Two times a day (BID) | ORAL | Status: DC
  Administered 2021-05-02: 25 mg via ORAL
  Filled 2021-05-02: qty 1

## 2021-05-02 MED ORDER — POTASSIUM CHLORIDE 20 MEQ PO PACK
40.0000 meq | PACK | Freq: Once | ORAL | Status: AC
  Administered 2021-05-02: 40 meq
  Filled 2021-05-02: qty 2

## 2021-05-02 MED ORDER — MAGNESIUM SULFATE 2 GM/50ML IV SOLN
2.0000 g | Freq: Once | INTRAVENOUS | Status: AC
  Administered 2021-05-02: 2 g via INTRAVENOUS
  Filled 2021-05-02: qty 50

## 2021-05-02 NOTE — Progress Notes (Signed)
ANTICOAGULATION CONSULT NOTE - Follow Up Consult  Pharmacy Consult for heparin Indication: atrial fibrillation  Labs: Recent Labs    05/01/21 1310 05/01/21 1639 05/01/21 1955 05/02/21 0317  HGB 16.7  --  14.3 14.2  HCT 50.4  --  42.0 42.2  PLT 355  --   --  242  HEPARINUNFRC  --   --   --  0.66  CREATININE 1.13  --   --   --   TROPONINIHS 193* 181*  --   --     Assessment/Plan:  75yo male therapeutic on heparin with initial dosing for Afib. Will continue infusion at current rate of 1250 units/hr and confirm stable with additional level.   Wynona Neat, PharmD, BCPS  05/02/2021,4:08 AM

## 2021-05-02 NOTE — Progress Notes (Signed)
Patient returned to full support per CCM due to patient not being ready for extubation today.

## 2021-05-02 NOTE — Progress Notes (Signed)
ANTICOAGULATION CONSULT NOTE - Initial Consult  Pharmacy Consult for IV Heparin Indication:  Atrial Fibrillation  Allergies  Allergen Reactions   Gabapentin Anxiety    Unknown reaction   Metformin And Related Rash    Patient Measurements: Total Body Weight: 89 kg Height: 69 inches Heparin Dosing Weight: 89 kg  Vital Signs: Temp: 100.2 F (37.9 C) (11/24 0700) Temp Source: Bladder (11/24 0600) BP: 115/84 (11/24 0700) Pulse Rate: 74 (11/24 0700)  Labs: Recent Labs    05/01/21 1310 05/01/21 1639 05/01/21 1955 05/02/21 0317 05/02/21 0503 05/02/21 0844  HGB 16.7  --  14.3 14.2 13.6  --   HCT 50.4  --  42.0 42.2 40.0  --   PLT 355  --   --  242  --   --   HEPARINUNFRC  --   --   --  0.66  --  0.57  CREATININE 1.13  --   --   --   --  1.31*  TROPONINIHS 193* 181*  --   --   --   --      Estimated Creatinine Clearance: 54.4 mL/min (A) (by C-G formula based on SCr of 1.31 mg/dL (H)).   Medical History: Past Medical History:  Diagnosis Date   Anxiety    Asthma    Chronic lower back pain    COPD (chronic obstructive pulmonary disease) (HCC)    Coronary artery disease    a. NSTEMI 05/2014 s/p DESx2 to LAD at [facility].   Depression    Educated about COVID-19 virus infection 03/06/2020   GERD (gastroesophageal reflux disease)    High cholesterol    Hypertension    NSTEMI (non-ST elevated myocardial infarction) ([place]) 05/2014   with stent placement   Sleep apnea    Stroke ([place]) 2017   anyeusym    TIA (transient ischemic attack)    "they say I've had some mini strokes; don't know when"; denies residual on 06/22/2014)   Type II diabetes mellitus ([place])    Ulcerative colitis ([place])     Assessment: 75 yr old man presented to [facility] with acute respiratory failure, acute on chronic COPD, asthma, pneumonia, chronic dysphagia, circulatory shock, hx diastolic CHF, hx CAD, HLD, HTN, smoking hx, DM2, GERD, hx of anxiety. Pt now transferred to [facility].  Pt with atrial flutter with V rate 130. Pharmacy is consulted to dose IV heparin for a fib. Pt was on no anticoagulant PTA. He rec'd a dose of Lovenox 40 mg for VTE prophylaxis at 1554 PM 11/23.  Heparin level - therapeutic at 0.57, hgb down slightly to 13.6  Goal of Therapy:  Heparin level 0.3-0.7 units/ml Monitor platelets by anticoagulation protocol: Yes   Plan:  Continue heparin infusion at 1250 units/hr Monitor daily heparin level, CBC Monitor for bleeding  Alanda Slim, PharmD, [facility] Clinical Pharmacist Please see AMION for all Pharmacists' Contact Phone Numbers 05/02/2021, 10:10 AM

## 2021-05-02 NOTE — Progress Notes (Signed)
NAME:  Ronald [name], MRN:  [id number], DOB:  Oct 28, 1945, LOS: 1 ADMISSION DATE:  05/01/2021, CONSULTATION DATE:  05/01/2021 REFERRING MD:  Dr Pearline Cables, CHIEF COMPLAINT:  resp distress/shock   History of Present Illness:  [name] is a 75 year old male with PMHx of COPD GOLD Stage 3-4, tracheal stenosis s/p trach in 2018, CAD s/p DESx2 to LAD in 2015, hypertension, hyperlipidemia, chronic respiratory failure on 2L, HFpEF, type II diabetes presenting with shortness of breath. He was given steroids and duonebs by EMS and placed on CPAP without significant intervention. Patient intubated in the ED for airway protection, given steroids, broad spectrum antibiotics and started on levophed for hypotension and admitted to [facility].   Patient had similar presentation for SOB to ED on 11/19 and recommended for admission for COPD exacerbation but declined admission. He was given prednisone and doxycycline on discharge.  Pertinent  Medical History   Past Medical History:  Diagnosis Date   Anxiety    Asthma    Chronic lower back pain    COPD (chronic obstructive pulmonary disease) (HCC)    Coronary artery disease    a. NSTEMI 05/2014 s/p DESx2 to LAD at [facility].   Depression    Educated about COVID-19 virus infection 03/06/2020   GERD (gastroesophageal reflux disease)    High cholesterol    Hypertension    NSTEMI (non-ST elevated myocardial infarction) ([place]) 05/2014   with stent placement   Sleep apnea    Stroke ([place]) 2017   anyeusym    TIA (transient ischemic attack)    "they say I've had some mini strokes; don't know when"; denies residual on 06/22/2014)   Type II diabetes mellitus ([place])    Ulcerative colitis ([place])      Significant Hospital Events: Including procedures, antibiotic start and stop dates in addition to other pertinent events   11/23 intubated resp distress and severe air trapped; transferred from [facility] to [facility]  Interim History / Subjective:  Patient remains on full vent support  this morning. He is tolerating well.  Continues to have Afib with RVR on amiodarone gtt  Objective   Blood pressure 120/67, pulse 96, temperature 99.9 F (37.7 C), resp. rate 13, weight 85.4 kg, SpO2 99 %.    Vent Mode: CPAP;PSV FiO2 (%):  [40 %-100 %] 40 % Set Rate:  [12 bmp-22 bmp] 12 bmp Vt Set:  [520 mL-600 mL] 600 mL PEEP:  [5 cmH20] 5 cmH20 Pressure Support:  [10 cmH20] 10 cmH20 Plateau Pressure:  [17 cmH20-24 cmH20] 17 cmH20   Intake/Output Summary (Last 24 hours) at 05/02/2021 0755 Last data filed at 05/02/2021 0600 Gross per 24 hour  Intake 3075.94 ml  Output 695 ml  Net 2380.94 ml   Filed Weights   05/02/21 0500  Weight: 85.4 kg    Examination: General: acutely ill appearing elderly male, on full vent support HENT: [place]/AT, anicteric sclerae, pinpoint pupils, ETT in place Lungs: diffuse expiratory wheezing, on full vent support Cardiovascular: Irregular, tachycardic, S1 and S2 present, no m/r/g Abdomen: soft, distended, +BS Extremities: warm and dry, no peripheral edema  Neuro: heavily sedated; pinpoint pupils reactive to light  Skin: No rash  Resolved Hospital Problem list   Circulatory shock - likely due to sedative vs sepsis  Assessment & Plan:  Acute on chronic hypoxic and hypercarbic respiratory failure 2/2 COPD exacerbation in setting of Flu pneumonia with possible superimposed bacterial infection Hx of tracheal stenosis  - Continued on full vent support, unable to  tolerate weaning at this time  - Continue Tamiflu for influenza and azithromycin for CAP coverage  - Continue scheduled duonebs and IV solumedrol  - Goal SpO2 88-95% - VAP prevention bundle - Continue precedex and fentanyl gtt for goal RASS -1  - F/u urine legionella   Aflutter with RVR  HR elevated to 120-130s despite amiodarone gtt. Patient is on metoprolol 50mg  bid at home.  - Metoprolol 25mg  bid, prn beta blocker as BP allows  - Wean off amiodarone gtt as tolerated  - Continue cardiac  monitoring - Heparin gtt   Hx of diastolic HF, CAD  Hypertension, HLD Holding home antihypertensives  - Continue aspirin  Shock liver  In setting of circulatory shock. LFTs improving. Bilirubin wnl.  - Trend LFTs  - Avoid hepatotoxic agents as possible   Elevated sCr  Trend renal function  Tobacco use disorder  - Tobacco cessation counseling once more awake   Hx of anxiety - Continue home lexapro and mirtazapine   Type II DM Continue SSI and CBG monitoring  Goal CBG <180  Best Practice (right click and "Reselect all SmartList Selections" daily)   Diet/type: tubefeeds DVT prophylaxis: systemic heparin GI prophylaxis: PPI Lines: N/A Foley:  Yes, and it is still needed Code Status:  full code Last date of multidisciplinary goals of care discussion [11/24 son, Shanon, updated via telephone]  Labs   CBC: Recent Labs  Lab 04/27/21 2031 05/01/21 1310 05/01/21 1955 05/02/21 0317 05/02/21 0503  WBC 10.1 12.1*  --  5.0  --   NEUTROABS  --  8.5*  --   --   --   HGB 14.9 16.7 14.3 14.2 13.6  HCT 43.3 50.4 42.0 42.2 40.0  MCV 86.1 88.3  --  84.2  --   PLT 294 355  --  242  --     Basic Metabolic Panel: Recent Labs  Lab 04/27/21 2031 05/01/21 1310 05/01/21 1955 05/02/21 0317 05/02/21 0503  NA 132* 135 134*  --  140  K 3.0* 3.5 3.6  --  2.9*  CL 91* 91*  --   --   --   CO2 31 36*  --   --   --   GLUCOSE 174* 282*  --   --   --   BUN 24* 44*  --   --   --   CREATININE 1.13 1.13  --   --   --   CALCIUM 8.0* 8.8*  --   --   --   MG  --   --   --  1.8  --    GFR: Estimated Creatinine Clearance: 63.1 mL/min (by C-G formula based on SCr of 1.13 mg/dL). Recent Labs  Lab 04/27/21 2031 05/01/21 1300 05/01/21 1310 05/01/21 1401 05/01/21 1639 05/02/21 0317  PROCALCITON  --  0.29  --   --   --   --   WBC 10.1  --  12.1*  --   --  5.0  LATICACIDVEN  --   --   --  3.1* 2.1*  --     Liver Function Tests: Recent Labs  Lab 05/01/21 1310  AST 184*  ALT 179*   ALKPHOS 94  BILITOT 1.1  PROT 7.7  ALBUMIN 3.9   No results for input(s): LIPASE, AMYLASE in the last 168 hours. No results for input(s): AMMONIA in the last 168 hours.  ABG    Component Value Date/Time   PHART 7.360 05/02/2021 0503   PCO2ART 52.1 (H)  05/02/2021 0503   PO2ART 93 05/02/2021 0503   HCO3 29.2 (H) 05/02/2021 0503   TCO2 31 05/02/2021 0503   O2SAT 96.0 05/02/2021 0503     Coagulation Profile: No results for input(s): INR, PROTIME in the last 168 hours.  Cardiac Enzymes: No results for input(s): CKTOTAL, CKMB, CKMBINDEX, TROPONINI in the last 168 hours.  HbA1C: HB A1C (BAYER DCA - WAIVED)  Date/Time Value Ref Range Status  09/12/2020 03:09 PM 6.8 <7.0 % Final    Comment:                                          Diabetic Adult            <7.0                                       Healthy Adult        4.3 - 5.7                                                           (DCCT/NGSP) American Diabetes Association's Summary of Glycemic Recommendations for Adults with Diabetes: Hemoglobin A1c <7.0%. More stringent glycemic goals (A1c <6.0%) may further reduce complications at the cost of increased risk of hypoglycemia.   12/15/2019 12:52 PM 6.6 <7.0 % Final    Comment:                                          Diabetic Adult            <7.0                                       Healthy Adult        4.3 - 5.7                                                           (DCCT/NGSP) American Diabetes Association's Summary of Glycemic Recommendations for Adults with Diabetes: Hemoglobin A1c <7.0%. More stringent glycemic goals (A1c <6.0%) may further reduce complications at the cost of increased risk of hypoglycemia.    Hgb A1c MFr Bld  Date/Time Value Ref Range Status  05/02/2021 03:17 AM 7.5 (H) 4.8 - 5.6 % Final    Comment:    (NOTE) Pre diabetes:          5.7%-6.4%  Diabetes:              >6.4%  Glycemic control for   <7.0% adults with diabetes    12/22/2020 05:52 AM 7.9 (H) 4.8 - 5.6 % Final    Comment:    (NOTE) Pre diabetes:          5.7%-6.4%  Diabetes:              >  6.4%  Glycemic control for   <7.0% adults with diabetes     CBG: Recent Labs  Lab 05/01/21 1257 05/01/21 1937 05/01/21 2358 05/02/21 0332  GLUCAP 278* 363* 265* 161*    Critical care time:

## 2021-05-03 ENCOUNTER — Inpatient Hospital Stay (HOSPITAL_COMMUNITY): Payer: Medicare HMO

## 2021-05-03 DIAGNOSIS — J9602 Acute respiratory failure with hypercapnia: Secondary | ICD-10-CM | POA: Diagnosis not present

## 2021-05-03 LAB — BASIC METABOLIC PANEL
Anion gap: 8 (ref 5–15)
Anion gap: 11 (ref 5–15)
BUN: 41 mg/dL — ABNORMAL HIGH (ref 8–23)
BUN: 36 mg/dL — ABNORMAL HIGH (ref 8–23)
CO2: 27 mmol/L (ref 22–32)
CO2: 24 mmol/L (ref 22–32)
Calcium: 8.3 mg/dL — ABNORMAL LOW (ref 8.9–10.3)
Calcium: 8 mg/dL — ABNORMAL LOW (ref 8.9–10.3)
Chloride: 103 mmol/L (ref 98–111)
Chloride: 103 mmol/L (ref 98–111)
Creatinine, Ser: 1.35 mg/dL — ABNORMAL HIGH (ref 0.61–1.24)
Creatinine, Ser: 1.16 mg/dL (ref 0.61–1.24)
GFR, Estimated: 55 mL/min — ABNORMAL LOW (ref >60)
GFR, Estimated: >60 mL/min (ref >60)
Glucose, Bld: 167 mg/dL — ABNORMAL HIGH (ref 70–99)
Glucose, Bld: 221 mg/dL — ABNORMAL HIGH (ref 70–99)
Potassium: 3.5 mmol/L (ref 3.5–5.1)
Potassium: 4.1 mmol/L (ref 3.5–5.1)
Sodium: 138 mmol/L (ref 135–145)
Sodium: 138 mmol/L (ref 135–145)

## 2021-05-03 LAB — CBC
HCT: 43.1 % (ref 39.0–52.0)
Hemoglobin: 14.9 g/dL (ref 13.0–17.0)
MCH: 29.2 pg (ref 26.0–34.0)
MCHC: 34.6 g/dL (ref 30.0–36.0)
MCV: 84.5 fL (ref 80.0–100.0)
Platelets: 269 10*3/uL (ref 150–400)
RBC: 5.1 MIL/uL (ref 4.22–5.81)
RDW: 12.8 % (ref 11.5–15.5)
WBC: 8.7 10*3/uL (ref 4.0–10.5)
nRBC: 0 % (ref 0.0–0.2)

## 2021-05-03 LAB — PROCALCITONIN: Procalcitonin: 0.23 ng/mL

## 2021-05-03 LAB — PHOSPHORUS
Phosphorus: 1.1 mg/dL — ABNORMAL LOW (ref 2.5–4.6)
Phosphorus: 3.1 mg/dL (ref 2.5–4.6)

## 2021-05-03 LAB — CULTURE, RESPIRATORY W GRAM STAIN
Culture: NORMAL
Special Requests: NORMAL

## 2021-05-03 LAB — GLUCOSE, CAPILLARY
Glucose-Capillary: 130 mg/dL — ABNORMAL HIGH (ref 70–99)
Glucose-Capillary: 147 mg/dL — ABNORMAL HIGH (ref 70–99)
Glucose-Capillary: 216 mg/dL — ABNORMAL HIGH (ref 70–99)
Glucose-Capillary: 191 mg/dL — ABNORMAL HIGH (ref 70–99)
Glucose-Capillary: 232 mg/dL — ABNORMAL HIGH (ref 70–99)
Glucose-Capillary: 215 mg/dL — ABNORMAL HIGH (ref 70–99)

## 2021-05-03 LAB — MAGNESIUM: Magnesium: 2.1 mg/dL (ref 1.7–2.4)

## 2021-05-03 LAB — HEPARIN LEVEL (UNFRACTIONATED): Heparin Unfractionated: 0.53 IU/mL (ref 0.30–0.70)

## 2021-05-03 LAB — TRIGLYCERIDES: Triglycerides: 86 mg/dL (ref <150)

## 2021-05-03 MED ORDER — SODIUM CHLORIDE 0.9 % IV SOLN
1.0000 g | INTRAVENOUS | Status: DC
  Filled 2021-05-03: qty 10

## 2021-05-03 MED ORDER — MIDAZOLAM HCL 2 MG/2ML IJ SOLN
INTRAMUSCULAR | Status: AC
  Filled 2021-05-03: qty 2

## 2021-05-03 MED ORDER — OSELTAMIVIR PHOSPHATE 30 MG PO CAPS
30.0000 mg | ORAL_CAPSULE | Freq: Two times a day (BID) | ORAL | Status: DC
  Administered 2021-05-03 – 2021-05-05 (×5): 30 mg
  Filled 2021-05-03 (×6): qty 1

## 2021-05-03 MED ORDER — FENTANYL BOLUS VIA INFUSION
25.0000 ug | INTRAVENOUS | Status: DC | PRN
  Administered 2021-05-04: 25 ug via INTRAVENOUS
  Administered 2021-05-05 (×4): 50 ug via INTRAVENOUS
  Administered 2021-05-06: 05:00:00 100 ug via INTRAVENOUS
  Administered 2021-05-06: 01:00:00 75 ug via INTRAVENOUS
  Administered 2021-05-08: 50 ug via INTRAVENOUS
  Filled 2021-05-03: qty 100

## 2021-05-03 MED ORDER — MIDAZOLAM HCL 2 MG/2ML IJ SOLN
4.0000 mg | Freq: Once | INTRAMUSCULAR | Status: AC
  Administered 2021-05-03: 4 mg via INTRAVENOUS

## 2021-05-03 MED ORDER — ETOMIDATE 2 MG/ML IV SOLN
20.0000 mg | Freq: Once | INTRAVENOUS | Status: AC
  Administered 2021-05-03: 20 mg via INTRAVENOUS

## 2021-05-03 MED ORDER — SODIUM CHLORIDE 0.9 % IV SOLN
2.0000 g | INTRAVENOUS | Status: AC
  Administered 2021-05-03 – 2021-05-09 (×7): 2 g via INTRAVENOUS
  Filled 2021-05-03 (×8): qty 20

## 2021-05-03 MED ORDER — BUDESONIDE 0.5 MG/2ML IN SUSP
0.5000 mg | Freq: Two times a day (BID) | RESPIRATORY_TRACT | Status: DC
  Administered 2021-05-03 – 2021-06-06 (×64): 0.5 mg via RESPIRATORY_TRACT
  Filled 2021-05-03 (×66): qty 2

## 2021-05-03 MED ORDER — AMIODARONE IV BOLUS ONLY 150 MG/100ML
150.0000 mg | Freq: Once | INTRAVENOUS | Status: AC
  Administered 2021-05-03: 150 mg via INTRAVENOUS

## 2021-05-03 MED ORDER — METOPROLOL TARTRATE 25 MG PO TABS
50.0000 mg | ORAL_TABLET | Freq: Two times a day (BID) | ORAL | Status: DC
Start: 2021-05-03 — End: 2021-05-08
  Administered 2021-05-03 – 2021-05-08 (×10): 50 mg
  Filled 2021-05-03 (×11): qty 2

## 2021-05-03 MED ORDER — FENTANYL 2500MCG IN NS 250ML (10MCG/ML) PREMIX INFUSION
25.0000 ug/h | INTRAVENOUS | Status: DC
  Administered 2021-05-03: 25 ug/h via INTRAVENOUS
  Administered 2021-05-05: 50 ug/h via INTRAVENOUS
  Filled 2021-05-03 (×3): qty 250

## 2021-05-03 MED ORDER — ORAL CARE MOUTH RINSE
15.0000 mL | OROMUCOSAL | Status: DC
  Administered 2021-05-04 – 2021-05-08 (×41): 15 mL via OROMUCOSAL

## 2021-05-03 MED ORDER — HYDRALAZINE HCL 20 MG/ML IJ SOLN
10.0000 mg | INTRAMUSCULAR | Status: DC | PRN
  Administered 2021-05-09 – 2021-05-31 (×4): 10 mg via INTRAVENOUS
  Filled 2021-05-03 (×4): qty 1

## 2021-05-03 MED ORDER — HYDRALAZINE HCL 20 MG/ML IJ SOLN
10.0000 mg | INTRAMUSCULAR | Status: DC | PRN
  Administered 2021-05-03: 10 mg via INTRAVENOUS
  Filled 2021-05-03: qty 1

## 2021-05-03 MED ORDER — POLYETHYLENE GLYCOL 3350 17 G PO PACK
17.0000 g | PACK | Freq: Every day | ORAL | Status: DC
  Administered 2021-05-04 – 2021-05-06 (×3): 17 g
  Filled 2021-05-03 (×3): qty 1

## 2021-05-03 MED ORDER — DOCUSATE SODIUM 50 MG/5ML PO LIQD
100.0000 mg | Freq: Two times a day (BID) | ORAL | Status: DC
  Administered 2021-05-04 – 2021-05-06 (×7): 100 mg
  Filled 2021-05-03 (×7): qty 10

## 2021-05-03 MED ORDER — CHLORHEXIDINE GLUCONATE 0.12% ORAL RINSE (MEDLINE KIT)
15.0000 mL | Freq: Two times a day (BID) | OROMUCOSAL | Status: DC
  Administered 2021-05-04 – 2021-05-08 (×10): 15 mL via OROMUCOSAL

## 2021-05-03 MED ORDER — CHLORHEXIDINE GLUCONATE 0.12 % MT SOLN
15.0000 mL | Freq: Two times a day (BID) | OROMUCOSAL | Status: DC
  Administered 2021-05-03: 15 mL via OROMUCOSAL

## 2021-05-03 MED ORDER — ROCURONIUM BROMIDE 10 MG/ML (PF) SYRINGE
PREFILLED_SYRINGE | INTRAVENOUS | Status: AC
  Filled 2021-05-03: qty 10

## 2021-05-03 MED ORDER — ROCURONIUM BROMIDE 10 MG/ML (PF) SYRINGE
50.0000 mg | PREFILLED_SYRINGE | Freq: Once | INTRAVENOUS | Status: AC
  Administered 2021-05-03: 50 mg via INTRAVENOUS

## 2021-05-03 MED ORDER — ORAL CARE MOUTH RINSE: 15.0000 mL | Freq: Two times a day (BID) | OROMUCOSAL | Status: DC

## 2021-05-03 MED ORDER — FENTANYL CITRATE (PF) 100 MCG/2ML IJ SOLN
25.0000 ug | Freq: Once | INTRAMUSCULAR | Status: AC
  Administered 2021-05-04: 25 ug via INTRAVENOUS

## 2021-05-03 MED ORDER — FENTANYL CITRATE (PF) 100 MCG/2ML IJ SOLN
INTRAMUSCULAR | Status: AC
  Filled 2021-05-03: qty 2

## 2021-05-03 MED ORDER — AMIODARONE IV BOLUS ONLY 150 MG/100ML: 150.0000 mg | Freq: Once | INTRAVENOUS | Status: DC

## 2021-05-03 MED ORDER — DEXMEDETOMIDINE HCL IN NACL 400 MCG/100ML IV SOLN
0.0000 ug/kg/h | INTRAVENOUS | Status: AC
  Administered 2021-05-03 – 2021-05-04 (×2): 0.4 ug/kg/h via INTRAVENOUS
  Administered 2021-05-04: 0.6 ug/kg/h via INTRAVENOUS
  Administered 2021-05-05 (×4): 0.8 ug/kg/h via INTRAVENOUS
  Administered 2021-05-06: 19:00:00 1 ug/kg/h via INTRAVENOUS
  Administered 2021-05-06: 03:00:00 0.9 ug/kg/h via INTRAVENOUS
  Administered 2021-05-06 (×2): 1 ug/kg/h via INTRAVENOUS
  Filled 2021-05-03 (×6): qty 100
  Filled 2021-05-03: qty 200
  Filled 2021-05-03 (×2): qty 100
  Filled 2021-05-03: qty 200

## 2021-05-03 MED ORDER — POTASSIUM PHOSPHATES 15 MMOLE/5ML IV SOLN
15.0000 mmol | Freq: Once | INTRAVENOUS | Status: AC
  Administered 2021-05-03: 15 mmol via INTRAVENOUS
  Filled 2021-05-03: qty 5

## 2021-05-03 MED ORDER — PANTOPRAZOLE 2 MG/ML SUSPENSION
40.0000 mg | Freq: Every day | ORAL | Status: DC
  Administered 2021-05-03 – 2021-05-07 (×5): 40 mg
  Filled 2021-05-03 (×7): qty 20

## 2021-05-03 MED ORDER — ETOMIDATE 2 MG/ML IV SOLN
INTRAVENOUS | Status: AC
  Filled 2021-05-03: qty 20

## 2021-05-03 NOTE — Progress Notes (Signed)
RT NOTE: patient placed on CPAP/PSV of 10/5 at 1125.  Tolerating well at this time.  Will continue to monitor.

## 2021-05-03 NOTE — Progress Notes (Signed)
Nutrition Consult - Brief Note  Received consult for enteral nutrition initiation and management. Spoke with RN. Plans to extubate patient today and patient will not need TF. No nutrition problems identified PTA.  Please re-consult as needed.  Lucas Mallow, RD, LDN, CNSC Please refer to [facility] for contact information.

## 2021-05-03 NOTE — Progress Notes (Signed)
ANTICOAGULATION CONSULT NOTE - Follow-up Consult  Pharmacy Consult for IV Heparin Indication:  Atrial Fibrillation  Allergies  Allergen Reactions   Gabapentin Anxiety    Unknown reaction   Metformin And Related Rash    Patient Measurements: Total Body Weight: 89 kg Height: 69 inches Heparin Dosing Weight: 89 kg  Vital Signs: Temp: 100.2 F (37.9 C) (11/25 1000) Temp Source: Bladder (11/25 0630) BP: 162/96 (11/25 1000) Pulse Rate: 85 (11/25 1000)  Labs: Recent Labs    05/01/21 1310 05/01/21 1639 05/01/21 1955 05/02/21 0317 05/02/21 0503 05/02/21 0844 05/03/21 0201 05/03/21 0352  HGB 16.7  --    < > 14.2 13.6  --  14.9  --   HCT 50.4  --    < > 42.2 40.0  --  43.1  --   PLT 355  --   --  242  --   --  269  --   HEPARINUNFRC  --   --   --  0.66  --  0.57  --  0.53  CREATININE 1.13  --   --  1.31*  --  1.31* 1.35*  --   TROPONINIHS 193* 181*  --   --   --   --   --   --    < > = values in this interval not displayed.    Estimated Creatinine Clearance: 52.8 mL/min (A) (by C-G formula based on SCr of 1.35 mg/dL (H)).   Medical History: Past Medical History:  Diagnosis Date   Anxiety    Asthma    Chronic lower back pain    COPD (chronic obstructive pulmonary disease) (HCC)    Coronary artery disease    a. NSTEMI 05/2014 s/p DESx2 to LAD at [facility].   Depression    Educated about COVID-19 virus infection 03/06/2020   GERD (gastroesophageal reflux disease)    High cholesterol    Hypertension    NSTEMI (non-ST elevated myocardial infarction) ([place]) 05/2014   with stent placement   Sleep apnea    Stroke ([place]) 2017   anyeusym    TIA (transient ischemic attack)    "they say I've had some mini strokes; don't know when"; denies residual on 06/22/2014)   Type II diabetes mellitus ([place])    Ulcerative colitis ([place])     Assessment: 75 yr old man presented to [facility] with acute respiratory failure, acute on chronic COPD, asthma, pneumonia, chronic  dysphagia, circulatory shock, hx diastolic CHF, hx CAD, HLD, HTN, smoking hx, DM2, GERD, hx of anxiety. Pt now transferred to [facility]. Pt with atrial flutter with V rate 130. Pharmacy is consulted to dose IV heparin for a fib. Pt was on no anticoagulant PTA. He rec'd a dose of Lovenox 40 mg for VTE prophylaxis at 1554 PM 11/23.  Heparin level - therapeutic at 0.53 on current rate of 1250 units/hr.  CBC within normal limits and stable.  No bleeding or issues with infusion reported.   Goal of Therapy:  Heparin level 0.3-0.7 units/ml Monitor platelets by anticoagulation protocol: Yes   Plan:  Continue heparin infusion at 1250 units/hr Monitor daily heparin level, CBC Monitor for bleeding  Sloan Leiter, PharmD, BCPS, BCCCP Clinical Pharmacist Please refer to [facility] for [place] numbers 05/03/2021, 10:41 AM

## 2021-05-03 NOTE — Progress Notes (Addendum)
[facility] Progress Note Patient Name: Ronald [name] DOB: 10-07-1945 MRN: [id number]   Date of Service  05/03/2021  HPI/Events of Note  Multiple issues - 1. AFlutter with RVR - Ventricular rate = 150. Last K+ = 4.1. Currently on an Amiodarone IV infusion for AFlutter. Sat - 97% and RR = 33.   eICU Interventions  Plan: Bolus with Amiodarone 150 mg IV over 10 minutes now.  Mg++ level STAT. Hydralazine 10 mg IV Q 4 hours PRN SBP > 170 or DBP > 100. Trial of BiPAP for increased WOB. Will request that PCCM ground team evaluate the patient at bedside.      Intervention Category Major Interventions: Hypertension - evaluation and management;Arrhythmia - evaluation and management  [name] 05/03/2021, 10:44 PM

## 2021-05-03 NOTE — Progress Notes (Signed)
PHARMACY NOTE:  ANTIMICROBIAL RENAL DOSAGE ADJUSTMENT  Current antimicrobial regimen includes a mismatch between antimicrobial dosage and estimated renal function.  As per policy approved by the Pharmacy & Therapeutics and Medical Executive Committees, the antimicrobial dosage will be adjusted accordingly.  Current antimicrobial dosage:  Tamiflu 75 BID  Indication: Flu A +  Renal Function:  Estimated Creatinine Clearance: 52.8 mL/min (A) (by C-G formula based on SCr of 1.35 mg/dL (H)). []      On intermittent HD, scheduled: []      On CRRT    Antimicrobial dosage has been changed to:  Tamiflu 30 BID for CrCl <60  Additional comments: Will continue to monitor SCr changes and adjust therapy as needed.    Thank you for allowing pharmacy to be a part of this patient's care.  Sloan Leiter, PharmD, BCPS, BCCCP Clinical Pharmacist Please refer to [facility] for [place] numbers 05/03/2021 10:48 AM

## 2021-05-03 NOTE — Procedures (Signed)
Extubation Procedure Note  Patient Details:   Name: Ronald [name] DOB: May 01, 1946 MRN: [id number]   Airway Documentation:    Vent end date: 05/03/21 Vent end time: 1530   Evaluation  O2 sats: stable throughout Complications: No apparent complications Patient did tolerate procedure well. Bilateral Breath Sounds: Clear, Diminished   Yes Pt was extubated with no complications. Pt is able to speak and is currently stable on 4L [place]. No audible cuffleak was heard but MD made aware. No signs of stridor at this time. RT wil continue to monitor.  [name] Jan 05/03/2021, 3:31 PM

## 2021-05-03 NOTE — Significant Event (Addendum)
OVERNIGHT CRITICAL CARE PROGRESS NOTE  CTSP re: respiratory distress.  Patient was extubated earlier today.  At the time of clinical interview, the patient is in obvious respiratory distress.  He is awake, tripodding, only occasionally answers yes/no questions, unable to follow simple commands (tongue protrusion, wiggling toes).  RR 40.  Rales and rhonchi bilaterally on exam.  Reintubated with Versed 4 mg, etomidate 20 mg, rocuronium 50 mg.  Sedation with Precedex/fentanyl.  Renee Pain, MD Board Certified by the ABIM, [place]

## 2021-05-03 NOTE — Procedures (Signed)
Intubation Procedure Note  Ronald [name]  [id number]  02/22/1946  Date:05/03/21  Time:11:25 PM   Provider Performing:Seong-Joo Carson Myrtle    Procedure: Intubation (47092)  Indication(s) Respiratory Failure  Consent Unable to obtain consent due to emergent nature of procedure.   Anesthesia Etomidate, Versed, and Rocuronium   Time Out Verified patient identification, verified procedure, site/side was marked, verified correct patient position, special equipment/implants available, medications/allergies/relevant history reviewed, required imaging and test results available.   Sterile Technique Usual hand hygeine, masks, and gloves were used   Procedure Description Patient positioned in bed supine.  Sedation given as noted above.  Patient was intubated with 8.0 endotracheal tube using Glidescope.  Position: 25 cm at teeth.  View was Grade 1 full glottis .  Number of attempts was 1.  Colorimetric CO2 detector was consistent with tracheal placement.   Complications/Tolerance None; patient tolerated the procedure well. Chest X-ray is ordered to verify placement.   EBL Minimal   Specimen(s) None   Renee Pain, MD Board Certified by the ABIM, [place]

## 2021-05-03 NOTE — Progress Notes (Addendum)
Portable equipment called for cooling blanket, informed that they would attempt to find one and bring it up for 2M15.  Until blanket arrives ice packs remain on patient for fever control

## 2021-05-03 NOTE — Progress Notes (Signed)
NAME:  Ronald [name], MRN:  [id number], DOB:  [birth year], LOS: 2 ADMISSION DATE:  05/01/2021, CONSULTATION DATE:  05/01/2021 REFERRING MD:  Dr Ronald [name], CHIEF COMPLAINT:  resp distress/shock   History of Present Illness:  [name] is a 75 year old male with PMHx of COPD GOLD Stage 3-4, tracheal stenosis s/p trach in 2018, CAD s/p DESx2 to LAD in 2015, hypertension, hyperlipidemia, chronic respiratory failure on 2L, HFpEF, type II diabetes presenting with shortness of breath. He was given steroids and duonebs by EMS and placed on CPAP without significant intervention. Patient intubated in the ED for airway protection, given steroids, broad spectrum antibiotics and started on levophed for hypotension and admitted to [facility].   Patient had similar presentation for SOB to ED on 11/19 and recommended for admission for COPD exacerbation but declined admission. He was given prednisone and doxycycline on discharge.  Pertinent  Medical History   Past Medical History:  Diagnosis Date   Anxiety    Asthma    Chronic lower back pain    COPD (chronic obstructive pulmonary disease) (HCC)    Coronary artery disease    a. NSTEMI 05/2014 s/p DESx2 to LAD at [facility].   Depression    Educated about COVID-19 virus infection 03/06/2020   GERD (gastroesophageal reflux disease)    High cholesterol    Hypertension    NSTEMI (non-ST elevated myocardial infarction) ([place]) 05/2014   with stent placement   Sleep apnea    Stroke ([place]) 2017   anyeusym    TIA (transient ischemic attack)    "they say I've had some mini strokes; don't know when"; denies residual on 06/22/2014)   Type II diabetes mellitus ([place])    Ulcerative colitis ([place])      Significant Hospital Events: Including procedures, antibiotic start and stop dates in addition to other pertinent events   11/23 intubated resp distress and severe air trapped; transferred from [facility] to [facility] 11/24 Continued on full vent support for acute respiratory failure and  amiodarone gtt and heparin gtt for Afib with RVR   Interim History / Subjective:  Overnight, remains on full vent support; Tmax 101.8 over past 24hrs This morning, remains heavily sedated and on full vent support.    Objective   Blood pressure 134/63, pulse 92, temperature (!) 100.4 F (38 C), temperature source Bladder, resp. rate 12, weight 85.4 kg, SpO2 100 %.    Vent Mode: PRVC FiO2 (%):  [40 %] 40 % Set Rate:  [12 bmp] 12 bmp Vt Set:  [600 mL] 600 mL PEEP:  [5 cmH20] [place] Pressure:  [16 cmH20-22 cmH20] 17 cmH20   Intake/Output Summary (Last 24 hours) at 05/03/2021 0757 Last data filed at 05/03/2021 0600 Gross per 24 hour  Intake 2655.36 ml  Output 1100 ml  Net 1555.36 ml   Filed Weights   05/02/21 0500  Weight: 85.4 kg    Examination: General: acute on chronically ill appearing elderly male, on full vent support HENT: [place]/AT, anicteric sclerae, pinpoint pupils, ETT in place Lungs: CTAB, on full vent support Cardiovascular: Irregular, tachycardic, S1 and S2 present, no m/r/g Abdomen: soft, distended, +BS Extremities: warm and dry, no peripheral edema  Neuro: heavily sedated; pinpoint pupils reactive to light  Skin: No rash  Resolved Hospital Problem list   Circulatory shock - likely due to sedative vs sepsis  Assessment & Plan:  Acute on chronic hypoxic and hypercarbic respiratory failure 2/2 COPD exacerbation in setting of Flu pneumonia with possible  superimposed bacterial infection Hx of tracheal stenosis  Procalcitonin trending down. Continuing to have fevers, likely in setting of flu. No leukocytosis. Resp culture with rare gram positive cocci.  - Continued on full vent support, will attempt SBT later today as allowed by mental status  - Continue Tamiflu for influenza and rocephin/azithromycin for CAP coverage  - Continue scheduled duonebs and IV solumedrol  - Goal SpO2 88-95% - VAP prevention bundle - Currently on fentanyl, precedex and propofol  gtt; will attempt to wean off with goal RASS 0 to -1 - F/u urine legionella and strep  Aflutter with RVR  Persistent HR to 120s this AM despite amiodarone gtt. Patient is on metoprolol 50mg  bid at home.  - Resumed home metoprolol dosing  - Wean off amiodarone gtt as tolerated  - Continue cardiac monitoring - Continue heparin gtt  Hx of diastolic HF, CAD  Hypertension, HLD Holding home antihypertensives  - Continue aspirin  Shock liver  In setting of circulatory shock. LFTs improving. Bilirubin wnl.  - Trend LFTs  - Avoid hepatotoxic agents as possible   Elevated sCr  Stable - Trend renal function  Tobacco use disorder  - Tobacco cessation counseling once more awake   Hx of anxiety - Continue home lexapro and mirtazapine   Type II DM Continue SSI and CBG monitoring  Goal CBG <180  Best Practice (right click and "Reselect all SmartList Selections" daily)   Diet/type: tubefeeds DVT prophylaxis: systemic heparin GI prophylaxis: PPI Lines: N/A Foley:  Yes, and it is still needed Code Status:  full code Last date of multidisciplinary goals of care discussion [11/24 son, Ronald [name], updated via telephone]  Labs   CBC: Recent Labs  Lab 04/27/21 2031 05/01/21 1310 05/01/21 1955 05/02/21 0317 05/02/21 0503 05/03/21 0201  WBC 10.1 12.1*  --  5.0  --  8.7  NEUTROABS  --  8.5*  --   --   --   --   HGB 14.9 16.7 14.3 14.2 13.6 14.9  HCT 43.3 50.4 42.0 42.2 40.0 43.1  MCV 86.1 88.3  --  84.2  --  84.5  PLT 294 355  --  242  --  025    Basic Metabolic Panel: Recent Labs  Lab 04/27/21 2031 05/01/21 1310 05/01/21 1955 05/02/21 0317 05/02/21 0503 05/02/21 0844 05/03/21 0201  NA 132* 135 134* 136 140 137 138  K 3.0* 3.5 3.6 3.0* 2.9* 3.1* 3.5  CL 91* 91*  --  100  --  101 103  CO2 31 36*  --  28  --  27 27  GLUCOSE 174* 282*  --  177*  --  131* 167*  BUN 24* 44*  --  40*  --  40* 41*  CREATININE 1.13 1.13  --  1.31*  --  1.31* 1.35*  CALCIUM 8.0* 8.8*  --  8.1*   --  8.2* 8.3*  MG  --   --   --  1.8  --   --  2.1  PHOS  --   --   --   --   --   --  1.1*   GFR: Estimated Creatinine Clearance: 52.8 mL/min (A) (by C-G formula based on SCr of 1.35 mg/dL (H)). Recent Labs  Lab 04/27/21 2031 05/01/21 1300 05/01/21 1310 05/01/21 1401 05/01/21 1639 05/02/21 0317 05/02/21 2038 05/03/21 0201  PROCALCITON  --  0.29  --   --   --  0.41  --  0.23  WBC 10.1  --  12.1*  --   --  5.0  --  8.7  LATICACIDVEN  --   --   --  3.1* 2.1*  --  2.1*  --     Liver Function Tests: Recent Labs  Lab 05/01/21 1310 05/02/21 0317 05/02/21 0844  AST 184* 140* 95*  ALT 179* 141* 130*  ALKPHOS 94 82 78  BILITOT 1.1 0.6 1.0  PROT 7.7 5.6* 5.9*  ALBUMIN 3.9 2.8* 2.8*   No results for input(s): LIPASE, AMYLASE in the last 168 hours. No results for input(s): AMMONIA in the last 168 hours.  ABG    Component Value Date/Time   PHART 7.360 05/02/2021 0503   PCO2ART 52.1 (H) 05/02/2021 0503   PO2ART 93 05/02/2021 0503   HCO3 29.2 (H) 05/02/2021 0503   TCO2 31 05/02/2021 0503   O2SAT 96.0 05/02/2021 0503     Coagulation Profile: No results for input(s): INR, PROTIME in the last 168 hours.  Cardiac Enzymes: No results for input(s): CKTOTAL, CKMB, CKMBINDEX, TROPONINI in the last 168 hours.  HbA1C: HB A1C (BAYER DCA - WAIVED)  Date/Time Value Ref Range Status  09/12/2020 03:09 PM 6.8 <7.0 % Final    Comment:                                          Diabetic Adult            <7.0                                       Healthy Adult        4.3 - 5.7                                                           (DCCT/NGSP) American Diabetes Association's Summary of Glycemic Recommendations for Adults with Diabetes: Hemoglobin A1c <7.0%. More stringent glycemic goals (A1c <6.0%) may further reduce complications at the cost of increased risk of hypoglycemia.   12/15/2019 12:52 PM 6.6 <7.0 % Final    Comment:                                          Diabetic Adult             <7.0                                       Healthy Adult        4.3 - 5.7                                                           (DCCT/NGSP) American Diabetes Association's Summary of Glycemic Recommendations for Adults with Diabetes: Hemoglobin A1c <7.0%. More stringent glycemic goals (A1c <6.0%)  may further reduce complications at the cost of increased risk of hypoglycemia.    Hgb A1c MFr Bld  Date/Time Value Ref Range Status  05/02/2021 03:17 AM 7.5 (H) 4.8 - 5.6 % Final    Comment:    (NOTE) Pre diabetes:          5.7%-6.4%  Diabetes:              >6.4%  Glycemic control for   <7.0% adults with diabetes   12/22/2020 05:52 AM 7.9 (H) 4.8 - 5.6 % Final    Comment:    (NOTE) Pre diabetes:          5.7%-6.4%  Diabetes:              >6.4%  Glycemic control for   <7.0% adults with diabetes     CBG: Recent Labs  Lab 05/02/21 1530 05/02/21 1939 05/02/21 2331 05/03/21 0325 05/03/21 0708  GLUCAP 217* 259* 260* 130* 147*    Critical care time:     Harvie Heck, MD Internal Medicine, PGY-3 05/03/21 12:13 PM Pager # [phone]

## 2021-05-03 NOTE — Progress Notes (Signed)
[place] Progress Note Patient Name: GURPREET MIKHAIL DOB: 28-Sep-1945 MRN: [id number]   Date of Service  05/03/2021  HPI/Events of Note  BMP, mag and phos noted. Also SBP in 190s  eICU Interventions  K phos 15 mmol x 1 PRN hydralazine     Intervention Category Major Interventions: Electrolyte abnormality - evaluation and management;Hypertension - evaluation and management  Margaretmary Lombard 05/03/2021, 4:15 AM

## 2021-05-04 DIAGNOSIS — J9602 Acute respiratory failure with hypercapnia: Secondary | ICD-10-CM | POA: Diagnosis not present

## 2021-05-04 LAB — BASIC METABOLIC PANEL
Anion gap: 9 (ref 5–15)
BUN: 38 mg/dL — ABNORMAL HIGH (ref 8–23)
CO2: 26 mmol/L (ref 22–32)
Calcium: 7.9 mg/dL — ABNORMAL LOW (ref 8.9–10.3)
Chloride: 104 mmol/L (ref 98–111)
Creatinine, Ser: 1.41 mg/dL — ABNORMAL HIGH (ref 0.61–1.24)
GFR, Estimated: 53 mL/min — ABNORMAL LOW (ref >60)
Glucose, Bld: 210 mg/dL — ABNORMAL HIGH (ref 70–99)
Potassium: 3.8 mmol/L (ref 3.5–5.1)
Sodium: 139 mmol/L (ref 135–145)

## 2021-05-04 LAB — GLUCOSE, CAPILLARY
Glucose-Capillary: 186 mg/dL — ABNORMAL HIGH (ref 70–99)
Glucose-Capillary: 163 mg/dL — ABNORMAL HIGH (ref 70–99)
Glucose-Capillary: 178 mg/dL — ABNORMAL HIGH (ref 70–99)
Glucose-Capillary: 194 mg/dL — ABNORMAL HIGH (ref 70–99)
Glucose-Capillary: 237 mg/dL — ABNORMAL HIGH (ref 70–99)
Glucose-Capillary: 254 mg/dL — ABNORMAL HIGH (ref 70–99)

## 2021-05-04 LAB — CBC
HCT: 42.2 % (ref 39.0–52.0)
Hemoglobin: 13.9 g/dL (ref 13.0–17.0)
MCH: 29 pg (ref 26.0–34.0)
MCHC: 32.9 g/dL (ref 30.0–36.0)
MCV: 87.9 fL (ref 80.0–100.0)
Platelets: 278 10*3/uL (ref 150–400)
RBC: 4.8 MIL/uL (ref 4.22–5.81)
RDW: 13.4 % (ref 11.5–15.5)
WBC: 13.7 10*3/uL — ABNORMAL HIGH (ref 4.0–10.5)
nRBC: 0 % (ref 0.0–0.2)

## 2021-05-04 LAB — POCT I-STAT 7, (LYTES, BLD GAS, ICA,H+H)
Acid-Base Excess: 1 mmol/L (ref 0.0–2.0)
Bicarbonate: 28.4 mmol/L — ABNORMAL HIGH (ref 20.0–28.0)
Calcium, Ion: 1.14 mmol/L — ABNORMAL LOW (ref 1.15–1.40)
HCT: 38 % — ABNORMAL LOW (ref 39.0–52.0)
Hemoglobin: 12.9 g/dL — ABNORMAL LOW (ref 13.0–17.0)
O2 Saturation: 99 %
Patient temperature: 99.1
Potassium: 3.6 mmol/L (ref 3.5–5.1)
Sodium: 141 mmol/L (ref 135–145)
TCO2: 30 mmol/L (ref 22–32)
pCO2 arterial: 59.1 mmHg — ABNORMAL HIGH (ref 32.0–48.0)
pH, Arterial: 7.292 — ABNORMAL LOW (ref 7.350–7.450)
pO2, Arterial: 153 mmHg — ABNORMAL HIGH (ref 83.0–108.0)

## 2021-05-04 LAB — MAGNESIUM
Magnesium: 2.1 mg/dL (ref 1.7–2.4)
Magnesium: 2.1 mg/dL (ref 1.7–2.4)
Magnesium: 1.9 mg/dL (ref 1.7–2.4)

## 2021-05-04 LAB — PHOSPHORUS
Phosphorus: 3.3 mg/dL (ref 2.5–4.6)
Phosphorus: 3.5 mg/dL (ref 2.5–4.6)

## 2021-05-04 LAB — HEPARIN LEVEL (UNFRACTIONATED): Heparin Unfractionated: 0.6 IU/mL (ref 0.30–0.70)

## 2021-05-04 MED ORDER — FUROSEMIDE 10 MG/ML IJ SOLN
40.0000 mg | Freq: Once | INTRAMUSCULAR | Status: AC
  Administered 2021-05-04: 40 mg via INTRAVENOUS
  Filled 2021-05-04: qty 4

## 2021-05-04 MED ORDER — VITAL AF 1.2 CAL PO LIQD
1000.0000 mL | ORAL | Status: DC
  Administered 2021-05-04 – 2021-05-08 (×6): 1000 mL
  Filled 2021-05-04 (×2): qty 1000

## 2021-05-04 MED ORDER — AZITHROMYCIN 500 MG PO TABS
500.0000 mg | ORAL_TABLET | ORAL | Status: DC
  Administered 2021-05-04: 500 mg via ORAL
  Filled 2021-05-04: qty 1

## 2021-05-04 MED ORDER — PROSOURCE TF PO LIQD: 45.0000 mL | Freq: Two times a day (BID) | ORAL | Status: DC

## 2021-05-04 MED ORDER — AMIODARONE HCL 200 MG PO TABS
100.0000 mg | ORAL_TABLET | Freq: Every day | ORAL | Status: DC
Start: 2021-05-13 — End: 2021-05-08

## 2021-05-04 MED ORDER — VITAL HIGH PROTEIN PO LIQD: 1000.0000 mL | ORAL | Status: DC

## 2021-05-04 MED ORDER — INSULIN GLARGINE-YFGN 100 UNIT/ML ~~LOC~~ SOLN
10.0000 [IU] | Freq: Every day | SUBCUTANEOUS | Status: DC
Start: 2021-05-04 — End: 2021-05-05
  Administered 2021-05-04 – 2021-05-05 (×2): 10 [IU] via SUBCUTANEOUS
  Filled 2021-05-04 (×2): qty 0.1

## 2021-05-04 MED ORDER — AZITHROMYCIN 500 MG PO TABS
500.0000 mg | ORAL_TABLET | ORAL | Status: AC
  Administered 2021-05-05: 22:00:00 500 mg
  Filled 2021-05-04: qty 1

## 2021-05-04 MED ORDER — AMIODARONE HCL 200 MG PO TABS
400.0000 mg | ORAL_TABLET | Freq: Two times a day (BID) | ORAL | Status: DC
  Administered 2021-05-04 – 2021-05-08 (×9): 400 mg
  Filled 2021-05-04 (×9): qty 2

## 2021-05-04 NOTE — Progress Notes (Signed)
NAME:  Ronald [name], MRN:  [id number], DOB:  1945-11-10, LOS: 3 ADMISSION DATE:  05/01/2021, CONSULTATION DATE:  05/01/2021 REFERRING MD:  Dr Pearline Cables, CHIEF COMPLAINT:  resp distress/shock   History of Present Illness:  [name] is a 75 year old male with PMHx of COPD GOLD Stage 3-4, tracheal stenosis s/p trach in 2018, CAD s/p DESx2 to LAD in 2015, hypertension, hyperlipidemia, chronic respiratory failure on 2L, HFpEF, type II diabetes presenting with shortness of breath. He was given steroids and duonebs by EMS and placed on CPAP without significant intervention. Patient intubated in the ED for airway protection, given steroids, broad spectrum antibiotics and started on levophed for hypotension and admitted to [facility].   Patient had similar presentation for SOB to ED on 11/19 and recommended for admission for COPD exacerbation but declined admission. He was given prednisone and doxycycline on discharge.  Pertinent  Medical History   Past Medical History:  Diagnosis Date   Anxiety    Asthma    Chronic lower back pain    COPD (chronic obstructive pulmonary disease) (HCC)    Coronary artery disease    a. NSTEMI 05/2014 s/p DESx2 to LAD at [facility].   Depression    Educated about COVID-19 virus infection 03/06/2020   GERD (gastroesophageal reflux disease)    High cholesterol    Hypertension    NSTEMI (non-ST elevated myocardial infarction) ([place]) 05/2014   with stent placement   Sleep apnea    Stroke (Ronald [name]) 2017   anyeusym    TIA (transient ischemic attack)    "they say I've had some mini strokes; don't know when"; denies residual on 06/22/2014)   Type II diabetes mellitus (Ronald [name])    Ulcerative colitis (Ronald [name])      Significant Hospital Events: Including procedures, antibiotic start and stop dates in addition to other pertinent events   11/23 intubated resp distress and severe air trapped; transferred from [facility] to [facility] 11/24 Continued on full vent support for acute respiratory failure and  amiodarone gtt and heparin gtt for Afib with RVR   Interim History / Subjective:   Extubated yesterday but developed respiratory distress overnight and reintubated.   Objective   Blood pressure 109/64, pulse 68, temperature 98.5 F (36.9 C), temperature source Bladder, resp. rate 20, weight 90.7 kg, SpO2 96 %.    Vent Mode: PRVC FiO2 (%):  [40 %-60 %] 40 % Set Rate:  [18 bmp-22 bmp] 22 bmp Vt Set:  [600 mL] 600 mL PEEP:  [5 cmH20] 5 cmH20 Pressure Support:  [5 cmH20-10 cmH20] 5 cmH20 Plateau Pressure:  [18 cmH20-22 cmH20] 19 cmH20   Intake/Output Summary (Last 24 hours) at 05/04/2021 0539 Last data filed at 05/04/2021 0700 Gross per 24 hour  Intake 3014.78 ml  Output 388 ml  Net 2626.78 ml   Filed Weights   05/02/21 0500 05/04/21 0245  Weight: 85.4 kg 90.7 kg    Examination: General: no acute distress, resting in bed, intubated, sedated HEENT: [place]/AT, moist mucous membranes, sclera anicteric Neuro: following commands, moving all extremities CV: irregularly irregular, s1s2, no murmurs PULM: diminished breath sounds with diffuse wheezing GI: soft, non-tender, non-distended, BS+ Extremities: warm, no edema Skin: no rashes   Resolved Hospital Problem list   Circulatory shock - likely due to sedative vs sepsis   Assessment & Plan:  Acute on chronic hypoxic and hypercarbic respiratory failure 2/2 COPD exacerbation in setting of Flu pneumonia with possible superimposed bacterial infection Hx of tracheal stenosis  Procalcitonin trending  down. Continuing to have fevers, likely in setting of flu. No leukocytosis. Resp culture with rare gram positive cocci.  - Continued on full vent support, will attempt SBT later today as allowed by mental status  - Continue Tamiflu for influenza and rocephin/azithromycin for CAP coverage  - Continue scheduled duonebs and IV solumedrol  - Goal SpO2 88-95% - VAP prevention bundle - Currently on fentanyl, precedex and propofol gtt; will attempt  to wean off with goal RASS 0 to -1 - F/u urine legionella and strep  Aflutter with RVR  Persistent HR to 120s this AM despite amiodarone gtt. Patient is on metoprolol 50mg  bid at home.  - Resumed home metoprolol dosing  - Transition to PO amiodarone - Continue cardiac monitoring - Continue heparin gtt  Hx of diastolic HF, CAD  Hypertension, HLD - resume imdur - Continue aspirin  Shock liver  In setting of circulatory shock. LFTs improving. Bilirubin wnl.  - Trend LFTs  - Avoid hepatotoxic agents as possible   Elevated sCr  Stable - Trend renal function  Tobacco use disorder  - Tobacco cessation counseling once more awake   Hx of anxiety - Continue home lexapro and mirtazapine   Type II DM Continue SSI and CBG monitoring  Goal CBG <180  Best Practice (right click and "Reselect all SmartList Selections" daily)   Diet/type: tubefeeds DVT prophylaxis: systemic heparin GI prophylaxis: PPI Lines: N/A Foley:  Yes, and it is still needed Code Status:  full code Last date of multidisciplinary goals of care discussion [11/24 son, Josua, updated via telephone]  Labs   CBC: Recent Labs  Lab 04/27/21 2031 05/01/21 1310 05/01/21 1955 05/02/21 0317 05/02/21 0503 05/03/21 0201 05/04/21 0047 05/04/21 0245  WBC 10.1 12.1*  --  5.0  --  8.7  --  13.7*  NEUTROABS  --  8.5*  --   --   --   --   --   --   HGB 14.9 16.7   < > 14.2 13.6 14.9 12.9* 13.9  HCT 43.3 50.4   < > 42.2 40.0 43.1 38.0* 42.2  MCV 86.1 88.3  --  84.2  --  84.5  --  87.9  PLT 294 355  --  242  --  269  --  278   < > = values in this interval not displayed.    Basic Metabolic Panel: Recent Labs  Lab 05/02/21 0317 05/02/21 0503 05/02/21 0844 05/03/21 0201 05/03/21 2032 05/04/21 0047 05/04/21 0245  NA 136   < > 137 138 138 141 139  K 3.0*   < > 3.1* 3.5 4.1 3.6 3.8  CL 100  --  101 103 103  --  104  CO2 28  --  27 27 24   --  26  GLUCOSE 177*  --  131* 167* 221*  --  210*  BUN 40*  --  40* 41*  36*  --  38*  CREATININE 1.31*  --  1.31* 1.35* 1.16  --  1.41*  CALCIUM 8.1*  --  8.2* 8.3* 8.0*  --  7.9*  MG 1.8  --   --  2.1  --   --  2.1  PHOS  --   --   --  1.1* 3.1  --   --    < > = values in this interval not displayed.   GFR: Estimated Creatinine Clearance: 51.9 mL/min (A) (by C-G formula based on SCr of 1.41 mg/dL (H)). Recent Labs  Lab  05/01/21 1300 05/01/21 1310 05/01/21 1401 05/01/21 1639 05/02/21 0317 05/02/21 2038 05/03/21 0201 05/04/21 0245  PROCALCITON 0.29  --   --   --  0.41  --  0.23  --   WBC  --  12.1*  --   --  5.0  --  8.7 13.7*  LATICACIDVEN  --   --  3.1* 2.1*  --  2.1*  --   --     Liver Function Tests: Recent Labs  Lab 05/01/21 1310 05/02/21 0317 05/02/21 0844  AST 184* 140* 95*  ALT 179* 141* 130*  ALKPHOS 94 82 78  BILITOT 1.1 0.6 1.0  PROT 7.7 5.6* 5.9*  ALBUMIN 3.9 2.8* 2.8*   No results for input(s): LIPASE, AMYLASE in the last 168 hours. No results for input(s): AMMONIA in the last 168 hours.  ABG    Component Value Date/Time   PHART 7.292 (L) 05/04/2021 0047   PCO2ART 59.1 (H) 05/04/2021 0047   PO2ART 153 (H) 05/04/2021 0047   HCO3 28.4 (H) 05/04/2021 0047   TCO2 30 05/04/2021 0047   O2SAT 99.0 05/04/2021 0047     Coagulation Profile: No results for input(s): INR, PROTIME in the last 168 hours.  Cardiac Enzymes: No results for input(s): CKTOTAL, CKMB, CKMBINDEX, TROPONINI in the last 168 hours.  HbA1C: HB A1C (BAYER DCA - WAIVED)  Date/Time Value Ref Range Status  09/12/2020 03:09 PM 6.8 <7.0 % Final    Comment:                                          Diabetic Adult            <7.0                                       Healthy Adult        4.3 - 5.7                                                           (DCCT/NGSP) American Diabetes Association's Summary of Glycemic Recommendations for Adults with Diabetes: Hemoglobin A1c <7.0%. More stringent glycemic goals (A1c <6.0%) may further reduce complications at  the cost of increased risk of hypoglycemia.   12/15/2019 12:52 PM 6.6 <7.0 % Final    Comment:                                          Diabetic Adult            <7.0                                       Healthy Adult        4.3 - 5.7                                                           (  DCCT/NGSP) American Diabetes Association's Summary of Glycemic Recommendations for Adults with Diabetes: Hemoglobin A1c <7.0%. More stringent glycemic goals (A1c <6.0%) may further reduce complications at the cost of increased risk of hypoglycemia.    Hgb A1c MFr Bld  Date/Time Value Ref Range Status  05/02/2021 03:17 AM 7.5 (H) 4.8 - 5.6 % Final    Comment:    (NOTE) Pre diabetes:          5.7%-6.4%  Diabetes:              >6.4%  Glycemic control for   <7.0% adults with diabetes   12/22/2020 05:52 AM 7.9 (H) 4.8 - 5.6 % Final    Comment:    (NOTE) Pre diabetes:          5.7%-6.4%  Diabetes:              >6.4%  Glycemic control for   <7.0% adults with diabetes     CBG: Recent Labs  Lab 05/03/21 1540 05/03/21 1927 05/03/21 2325 05/04/21 0336 05/04/21 0730  GLUCAP 191* 232* 215* 186* 163*    Critical care time: 40 minutes    Freda Jackson, MD [facility] Office: [phone]   See Amion for personal pager PCCM on call pager [phone] until 7pm. Please call Elink 7p-7a. [phone]

## 2021-05-04 NOTE — Progress Notes (Addendum)
Initial Nutrition Assessment  DOCUMENTATION CODES:   Not applicable  INTERVENTION:   Initiate tube feeding via OG tube: Vital AF 1.2 at 35 ml/h, increase by 10 ml every 4 hours to goal rate of 75 ml/h (1800 ml per day)  Provides 2160 kcal, 135 gm protein, 1460 ml free water daily  NUTRITION DIAGNOSIS:   Inadequate oral intake related to inability to eat as evidenced by NPO status.  GOAL:   Patient will meet greater than or equal to 90% of their needs  MONITOR:   Vent status, TF tolerance, Labs  REASON FOR ASSESSMENT:   Consult Enteral/tube feeding initiation and management  ASSESSMENT:   74 yo male admitted from [facility] with acute respiratory failure, influenza A. PMH includes COPD, CAD, ulcerative colitis, HTN, asthma, DM-2, TIA, GERD, HLD.  Patient was extubated 11/25, but required re-intubation later that night. Received MD Consult for TF initiation and management. OG tube in place.  Patient is currently intubated on ventilator support MV: 12 L/min Temp (24hrs), Avg:100.3 F (37.9 C), Min:98.1 F (36.7 C), Max:101.5 F (38.6 C)   Labs reviewed.  CBG: 163  Medications reviewed and include Colace, Lasix, Novolog, Semglee, Solumedrol, Protonix, Miralax, Precedex, Fentanyl.  Weight history reviewed. Usual weight 89 kg (02/07/21), down to 85.4 kg on admission, up to 90.7 kg today. No significant weight changes. Weight loss PTA could be related to recent poor intake r/t decreased appetite with acute illness (Flu).  I/O +7L since admission. Positive fluid balance likely responsible for increase in weight since admission.   NUTRITION - FOCUSED PHYSICAL EXAM:  Unable to complete  Diet Order:   Diet Order             Diet NPO time specified  Diet effective now                   EDUCATION NEEDS:   No education needs have been identified at this time  Skin:  Skin Assessment: Reviewed RN Assessment  Last BM:  no BM documented  Height:   Ht Readings  from Last 1 Encounters:  04/27/21 5\' 9"  (1.753 m)    Weight:   Wt Readings from Last 1 Encounters:  05/04/21 90.7 kg    BMI:  Body mass index is 29.53 kg/m.  Estimated Nutritional Needs:   Kcal:  2150  Protein:  125-140 gm  Fluid:  2.1-2.3 L    Lucas Mallow, RD, LDN, CNSC Please refer to Amion for contact information.

## 2021-05-04 NOTE — Progress Notes (Signed)
ANTICOAGULATION CONSULT NOTE - Follow-up Consult  Pharmacy Consult for IV Heparin Indication:  Atrial Fibrillation  Allergies  Allergen Reactions   Gabapentin Anxiety    Unknown reaction   Metformin And Related Rash    Patient Measurements: Total Body Weight: 89 kg Height: 69 inches Heparin Dosing Weight: 89 kg  Vital Signs: Temp: 98.5 F (36.9 C) (11/26 0800) Temp Source: Bladder (11/26 0800) BP: 117/66 (11/26 0900) Pulse Rate: 68 (11/26 0900)  Labs: Recent Labs    05/01/21 1310 05/01/21 1639 05/01/21 1955 05/02/21 0317 05/02/21 0503 05/02/21 0844 05/03/21 0201 05/03/21 0352 05/03/21 2032 05/04/21 0047 05/04/21 0245  HGB 16.7  --    < > 14.2   < >  --  14.9  --   --  12.9* 13.9  HCT 50.4  --    < > 42.2   < >  --  43.1  --   --  38.0* 42.2  PLT 355  --   --  242  --   --  269  --   --   --  278  HEPARINUNFRC  --   --    < > 0.66  --  0.57  --  0.53  --   --  0.60  CREATININE 1.13  --   --  1.31*  --  1.31* 1.35*  --  1.16  --  1.41*  TROPONINIHS 193* 181*  --   --   --   --   --   --   --   --   --    < > = values in this interval not displayed.    Estimated Creatinine Clearance: 51.9 mL/min (A) (by C-G formula based on SCr of 1.41 mg/dL (H)).   Medical History: Past Medical History:  Diagnosis Date   Anxiety    Asthma    Chronic lower back pain    COPD (chronic obstructive pulmonary disease) (HCC)    Coronary artery disease    a. NSTEMI 05/2014 s/p DESx2 to LAD at [facility].   Depression    Educated about COVID-19 virus infection 03/06/2020   GERD (gastroesophageal reflux disease)    High cholesterol    Hypertension    NSTEMI (non-ST elevated myocardial infarction) ([place]) 05/2014   with stent placement   Sleep apnea    Stroke ([place]) 2017   anyeusym    TIA (transient ischemic attack)    "they say I've had some mini strokes; don't know when"; denies residual on 06/22/2014)   Type II diabetes mellitus ([place])    Ulcerative colitis ([place])      Assessment: 75 yr old man presented to [facility] with acute respiratory failure, acute on chronic COPD, asthma, pneumonia, chronic dysphagia, circulatory shock, hx diastolic CHF, hx CAD, HLD, HTN, smoking hx, DM2, GERD, hx of anxiety. Pt now transferred to [facility]. Pt with atrial flutter with V rate 130. Pharmacy is consulted to dose IV heparin for a fib. Pt was on no anticoagulant PTA. He rec'd a dose of Lovenox 40 mg for VTE prophylaxis at 1554 PM 11/23.  Heparin level remains therapeutic at 0.60 on current rate of 1250 units/hr.  CBC within normal limits and stable.  No bleeding or issues with infusion reported.   Goal of Therapy:  Heparin level 0.3-0.7 units/ml Monitor platelets by anticoagulation protocol: Yes   Plan:  Continue heparin infusion at 1250 units/hr Monitor daily heparin level, CBC Monitor for bleeding  Sloan Leiter, PharmD, BCPS, BCCCP  Clinical Pharmacist Please refer to [facility] for [place] numbers 05/04/2021, 10:17 AM

## 2021-05-05 ENCOUNTER — Inpatient Hospital Stay (HOSPITAL_COMMUNITY): Payer: Medicare HMO

## 2021-05-05 DIAGNOSIS — J9602 Acute respiratory failure with hypercapnia: Secondary | ICD-10-CM | POA: Diagnosis not present

## 2021-05-05 LAB — GLUCOSE, CAPILLARY
Glucose-Capillary: 179 mg/dL — ABNORMAL HIGH (ref 70–99)
Glucose-Capillary: 182 mg/dL — ABNORMAL HIGH (ref 70–99)
Glucose-Capillary: 236 mg/dL — ABNORMAL HIGH (ref 70–99)
Glucose-Capillary: 253 mg/dL — ABNORMAL HIGH (ref 70–99)
Glucose-Capillary: 267 mg/dL — ABNORMAL HIGH (ref 70–99)
Glucose-Capillary: 323 mg/dL — ABNORMAL HIGH (ref 70–99)

## 2021-05-05 LAB — BASIC METABOLIC PANEL
Anion gap: 11 (ref 5–15)
BUN: 40 mg/dL — ABNORMAL HIGH (ref 8–23)
CO2: 26 mmol/L (ref 22–32)
Calcium: 8.5 mg/dL — ABNORMAL LOW (ref 8.9–10.3)
Chloride: 104 mmol/L (ref 98–111)
Creatinine, Ser: 1.13 mg/dL (ref 0.61–1.24)
GFR, Estimated: >60 mL/min (ref >60)
Glucose, Bld: 201 mg/dL — ABNORMAL HIGH (ref 70–99)
Potassium: 3.9 mmol/L (ref 3.5–5.1)
Sodium: 141 mmol/L (ref 135–145)

## 2021-05-05 LAB — PHOSPHORUS
Phosphorus: 3.1 mg/dL (ref 2.5–4.6)
Phosphorus: 3.7 mg/dL (ref 2.5–4.6)

## 2021-05-05 LAB — CBC
HCT: 43.5 % (ref 39.0–52.0)
Hemoglobin: 14.5 g/dL (ref 13.0–17.0)
MCH: 28.5 pg (ref 26.0–34.0)
MCHC: 33.3 g/dL (ref 30.0–36.0)
MCV: 85.5 fL (ref 80.0–100.0)
Platelets: 272 10*3/uL (ref 150–400)
RBC: 5.09 MIL/uL (ref 4.22–5.81)
RDW: 13.2 % (ref 11.5–15.5)
WBC: 10.6 10*3/uL — ABNORMAL HIGH (ref 4.0–10.5)
nRBC: 0 % (ref 0.0–0.2)

## 2021-05-05 LAB — MAGNESIUM
Magnesium: 2 mg/dL (ref 1.7–2.4)
Magnesium: 1.7 mg/dL (ref 1.7–2.4)

## 2021-05-05 LAB — HEPARIN LEVEL (UNFRACTIONATED): Heparin Unfractionated: 0.44 IU/mL (ref 0.30–0.70)

## 2021-05-05 MED ORDER — POTASSIUM CHLORIDE 20 MEQ PO PACK
40.0000 meq | PACK | Freq: Once | ORAL | Status: AC
  Administered 2021-05-05: 10:00:00 40 meq
  Filled 2021-05-05: qty 2

## 2021-05-05 MED ORDER — INSULIN GLARGINE-YFGN 100 UNIT/ML ~~LOC~~ SOLN
20.0000 [IU] | Freq: Every day | SUBCUTANEOUS | Status: DC
  Administered 2021-05-06 – 2021-05-07 (×2): 20 [IU] via SUBCUTANEOUS
  Filled 2021-05-05 (×2): qty 0.2

## 2021-05-05 MED ORDER — FUROSEMIDE 10 MG/ML IJ SOLN
40.0000 mg | Freq: Once | INTRAMUSCULAR | Status: AC
  Administered 2021-05-05: 10:00:00 40 mg via INTRAVENOUS
  Filled 2021-05-05: qty 4

## 2021-05-05 MED ORDER — ISOSORBIDE MONONITRATE 10 MG PO TABS
15.0000 mg | ORAL_TABLET | Freq: Two times a day (BID) | ORAL | Status: DC
  Administered 2021-05-05 (×2): 15 mg
  Filled 2021-05-05 (×3): qty 1.5

## 2021-05-05 MED ORDER — INSULIN GLARGINE-YFGN 100 UNIT/ML ~~LOC~~ SOLN
10.0000 [IU] | SUBCUTANEOUS | Status: AC
  Administered 2021-05-05: 12:00:00 10 [IU] via SUBCUTANEOUS
  Filled 2021-05-05: qty 0.1

## 2021-05-05 MED ORDER — ISOSORBIDE MONONITRATE ER 30 MG PO TB24
30.0000 mg | ORAL_TABLET | Freq: Every day | ORAL | Status: DC
  Filled 2021-05-05: qty 1

## 2021-05-05 NOTE — Progress Notes (Signed)
ANTICOAGULATION CONSULT NOTE - Follow-up Consult  Pharmacy Consult for IV Heparin Indication:  Atrial Fibrillation  Allergies  Allergen Reactions   Gabapentin Anxiety    Unknown reaction   Metformin And Related Rash    Patient Measurements: Total Body Weight: 89 kg Height: 69 inches Heparin Dosing Weight: 89 kg  Vital Signs: Temp: 98.6 F (37 C) (11/27 0744) Temp Source: Core (11/27 0744) BP: 154/71 (11/27 0700) Pulse Rate: 86 (11/27 0736)  Labs: Recent Labs    05/03/21 0201 05/03/21 0352 05/03/21 2032 05/04/21 0047 05/04/21 0245 05/05/21 0414  HGB 14.9  --   --  12.9* 13.9 14.5  HCT 43.1  --   --  38.0* 42.2 43.5  PLT 269  --   --   --  278 272  HEPARINUNFRC  --  0.53  --   --  0.60 0.44  CREATININE 1.35*  --  1.16  --  1.41*  --     Estimated Creatinine Clearance: 51.7 mL/min (A) (by C-G formula based on SCr of 1.41 mg/dL (H)).   Medical History: Past Medical History:  Diagnosis Date   Anxiety    Asthma    Chronic lower back pain    COPD (chronic obstructive pulmonary disease) (HCC)    Coronary artery disease    a. NSTEMI 05/2014 s/p DESx2 to LAD at [facility].   Depression    Educated about COVID-19 virus infection 03/06/2020   GERD (gastroesophageal reflux disease)    High cholesterol    Hypertension    NSTEMI (non-ST elevated myocardial infarction) ([place]) 05/2014   with stent placement   Sleep apnea    Stroke ([place]) 2017   anyeusym    TIA (transient ischemic attack)    "they say I've had some mini strokes; don't know when"; denies residual on 06/22/2014)   Type II diabetes mellitus ([place])    Ulcerative colitis ([place])     Assessment: 75 yr old man presented to [facility] with acute respiratory failure, acute on chronic COPD, asthma, pneumonia, chronic dysphagia, circulatory shock, hx diastolic CHF, hx CAD, HLD, HTN, smoking hx, DM2, GERD, hx of anxiety. Pt now transferred to [facility]. Pt with atrial flutter with V rate 130. Pharmacy  is consulted to dose IV heparin for a fib. Pt was on no anticoagulant PTA. He rec'd a dose of Lovenox 40 mg for VTE prophylaxis at 1554 PM 11/23.  Heparin level remains therapeutic at 0.44 on current rate of 1250 units/hr.  CBC within normal limits and stable.  No bleeding or issues with infusion reported.   Goal of Therapy:  Heparin level 0.3-0.7 units/ml Monitor platelets by anticoagulation protocol: Yes   Plan:  Continue heparin infusion at 1250 units/hr Monitor daily heparin level, CBC Monitor for bleeding Follow-up long-term anticoagulation plans  Sloan Leiter, PharmD, BCPS, BCCCP Clinical Pharmacist Please refer to [facility] for [place] numbers 05/05/2021, 8:39 AM

## 2021-05-05 NOTE — Progress Notes (Signed)
NAME:  Ronald [name], MRN:  [id number], DOB:  12-12-1945, LOS: 4 ADMISSION DATE:  05/01/2021, CONSULTATION DATE:  05/01/2021 REFERRING MD:  Dr Pearline Cables, CHIEF COMPLAINT:  resp distress/shock   History of Present Illness:  Ronald [name] is a 75 year old male with PMHx of COPD GOLD Stage 3-4, tracheal stenosis s/p trach in 2018, CAD s/p DESx2 to LAD in 2015, hypertension, hyperlipidemia, chronic respiratory failure on 2L, HFpEF, type II diabetes presenting with shortness of breath. He was given steroids and duonebs by EMS and placed on CPAP without significant intervention. Patient intubated in the ED for airway protection, given steroids, broad spectrum antibiotics and started on levophed for hypotension and admitted to [facility].   Patient had similar presentation for SOB to ED on 11/19 and recommended for admission for COPD exacerbation but declined admission. He was given prednisone and doxycycline on discharge.  Pertinent  Medical History   Past Medical History:  Diagnosis Date   Anxiety    Asthma    Chronic lower back pain    COPD (chronic obstructive pulmonary disease) (HCC)    Coronary artery disease    a. NSTEMI 05/2014 s/p DESx2 to LAD at [facility].   Depression    Educated about COVID-19 virus infection 03/06/2020   GERD (gastroesophageal reflux disease)    High cholesterol    Hypertension    NSTEMI (non-ST elevated myocardial infarction) ([place]) 05/2014   with stent placement   Sleep apnea    Stroke ([place]) 2017   anyeusym    TIA (transient ischemic attack)    "they say I've had some mini strokes; don't know when"; denies residual on 06/22/2014)   Type II diabetes mellitus ([place])    Ulcerative colitis ([place])      Significant Hospital Events: Including procedures, antibiotic start and stop dates in addition to other pertinent events   11/23 intubated resp distress and severe air trapped; transferred from [facility] to [facility] 11/24 Continued on full vent support for acute respiratory failure and  amiodarone gtt and heparin gtt for Afib with RVR   Interim History / Subjective:   Remains intubated. Failed SBT this morning due to tachypnea.   No other acute events.  Objective   Blood pressure (!) 154/71, pulse 86, temperature 98.6 F (37 C), temperature source Core, resp. rate 16, weight 89.9 kg, SpO2 94 %.    Vent Mode: PSV;CPAP FiO2 (%):  [40 %] 40 % Set Rate:  [22 bmp] 22 bmp Vt Set:  [600 mL] 600 mL PEEP:  [5 cmH20] 5 cmH20 Pressure Support:  [8 cmH20] 8 cmH20 Plateau Pressure:  [15 cmH20-22 cmH20] 16 cmH20   Intake/Output Summary (Last 24 hours) at 05/05/2021 4128 Last data filed at 05/05/2021 0800 Gross per 24 hour  Intake 2737.04 ml  Output 2390 ml  Net 347.04 ml   Filed Weights   05/02/21 0500 05/04/21 0245 05/05/21 0100  Weight: 85.4 kg 90.7 kg 89.9 kg    Examination: General: chronically ill appearing, no acute distress, resting in bed, intubated HEENT: [place]/AT, moist mucous membranes, sclera anicteric Neuro: following commands, moving all extremities CV: irregularly irregular, s1s2, no murmurs PULM: diminished breath sounds GI: soft, non-tender, non-distended, BS+ Extremities: warm, no edema Skin: no rashes   Resolved Hospital Problem list   Circulatory shock - likely due to sedative vs sepsis   Assessment & Plan:  Acute on chronic hypoxic and hypercarbic respiratory failure 2/2 COPD exacerbation in setting of Flu pneumonia with possible superimposed bacterial infection Hx  of tracheal stenosis  Procalcitonin trending down. Continuing to have fevers, likely in setting of flu. No leukocytosis. Resp culture with rare gram positive cocci.  - Extubated 11/25 but re-intubated that night due to increased work of breathing. - Continue on full vent support, failed SBT this AM due to tachypnea - Continue Tamiflu for influenza and rocephin/azithromycin for CAP coverage  - Continue scheduled duonebs and IV solumedrol  - Goal SpO2 88-95% - VAP prevention  bundle - Currently on fentanyl, precedex and propofol gtt, goal RASS 0 to -1 - Diuresis as able  Aflutter with RVR  Persistent HR to 120s this AM despite amiodarone gtt. Patient is on metoprolol 50mg  bid at home.  - Resumed home metoprolol dosing  - Transition to PO amiodarone - Continue cardiac monitoring - Continue heparin gtt  Hx of diastolic HF, CAD  Hypertension, HLD - resume imdur - Continue aspirin  Shock liver  In setting of circulatory shock. LFTs improving. Bilirubin wnl.  - Trend LFTs  - Avoid hepatotoxic agents as possible   Elevated sCr  Stable - Trend renal function  Tobacco use disorder  - Tobacco cessation counseling once more awake   Hx of anxiety - Continue home lexapro and mirtazapine   Type II DM Continue SSI and CBG monitoring  Goal CBG <180  Best Practice (right click and "Reselect all SmartList Selections" daily)   Diet/type: tubefeeds DVT prophylaxis: systemic heparin GI prophylaxis: PPI Lines: N/A Foley:  Yes, and it is still needed Code Status:  full code Last date of multidisciplinary goals of care discussion [11/24 son, Jonatan, updated via telephone]  Labs   CBC: Recent Labs  Lab 05/01/21 1310 05/01/21 1955 05/02/21 0317 05/02/21 0503 05/03/21 0201 05/04/21 0047 05/04/21 0245 05/05/21 0414  WBC 12.1*  --  5.0  --  8.7  --  13.7* 10.6*  NEUTROABS 8.5*  --   --   --   --   --   --   --   HGB 16.7   < > 14.2 13.6 14.9 12.9* 13.9 14.5  HCT 50.4   < > 42.2 40.0 43.1 38.0* 42.2 43.5  MCV 88.3  --  84.2  --  84.5  --  87.9 85.5  PLT 355  --  242  --  269  --  278 272   < > = values in this interval not displayed.    Basic Metabolic Panel: Recent Labs  Lab 05/02/21 0317 05/02/21 0503 05/02/21 0844 05/03/21 0201 05/03/21 2032 05/04/21 0047 05/04/21 0245 05/04/21 1013 05/04/21 1646 05/05/21 0414  NA 136   < > 137 138 138 141 139  --   --   --   K 3.0*   < > 3.1* 3.5 4.1 3.6 3.8  --   --   --   CL 100  --  101 103 103  --   104  --   --   --   CO2 28  --  27 27 24   --  26  --   --   --   GLUCOSE 177*  --  131* 167* 221*  --  210*  --   --   --   BUN 40*  --  40* 41* 36*  --  38*  --   --   --   CREATININE 1.31*  --  1.31* 1.35* 1.16  --  1.41*  --   --   --   CALCIUM 8.1*  --  8.2*  8.3* 8.0*  --  7.9*  --   --   --   MG 1.8  --   --  2.1  --   --  2.1 2.1 1.9 2.0  PHOS  --   --   --  1.1* 3.1  --   --  3.3 3.5 3.1   < > = values in this interval not displayed.   GFR: Estimated Creatinine Clearance: 51.7 mL/min (A) (by C-G formula based on SCr of 1.41 mg/dL (H)). Recent Labs  Lab 05/01/21 1300 05/01/21 1310 05/01/21 1401 05/01/21 1639 05/02/21 0317 05/02/21 2038 05/03/21 0201 05/04/21 0245 05/05/21 0414  PROCALCITON 0.29  --   --   --  0.41  --  0.23  --   --   WBC  --    < >  --   --  5.0  --  8.7 13.7* 10.6*  LATICACIDVEN  --   --  3.1* 2.1*  --  2.1*  --   --   --    < > = values in this interval not displayed.    Liver Function Tests: Recent Labs  Lab 05/01/21 1310 05/02/21 0317 05/02/21 0844  AST 184* 140* 95*  ALT 179* 141* 130*  ALKPHOS 94 82 78  BILITOT 1.1 0.6 1.0  PROT 7.7 5.6* 5.9*  ALBUMIN 3.9 2.8* 2.8*   No results for input(s): LIPASE, AMYLASE in the last 168 hours. No results for input(s): AMMONIA in the last 168 hours.  ABG    Component Value Date/Time   PHART 7.292 (L) 05/04/2021 0047   PCO2ART 59.1 (H) 05/04/2021 0047   PO2ART 153 (H) 05/04/2021 0047   HCO3 28.4 (H) 05/04/2021 0047   TCO2 30 05/04/2021 0047   O2SAT 99.0 05/04/2021 0047     Coagulation Profile: No results for input(s): INR, PROTIME in the last 168 hours.  Cardiac Enzymes: No results for input(s): CKTOTAL, CKMB, CKMBINDEX, TROPONINI in the last 168 hours.  HbA1C: HB A1C (BAYER DCA - WAIVED)  Date/Time Value Ref Range Status  09/12/2020 03:09 PM 6.8 <7.0 % Final    Comment:                                          Diabetic Adult            <7.0                                        Healthy Adult        4.3 - 5.7                                                           (DCCT/NGSP) American Diabetes Association's Summary of Glycemic Recommendations for Adults with Diabetes: Hemoglobin A1c <7.0%. More stringent glycemic goals (A1c <6.0%) may further reduce complications at the cost of increased risk of hypoglycemia.   12/15/2019 12:52 PM 6.6 <7.0 % Final    Comment:  Diabetic Adult            <7.0                                       Healthy Adult        4.3 - 5.7                                                           (DCCT/NGSP) American Diabetes Association's Summary of Glycemic Recommendations for Adults with Diabetes: Hemoglobin A1c <7.0%. More stringent glycemic goals (A1c <6.0%) may further reduce complications at the cost of increased risk of hypoglycemia.    Hgb A1c MFr Bld  Date/Time Value Ref Range Status  05/02/2021 03:17 AM 7.5 (H) 4.8 - 5.6 % Final    Comment:    (NOTE) Pre diabetes:          5.7%-6.4%  Diabetes:              >6.4%  Glycemic control for   <7.0% adults with diabetes   12/22/2020 05:52 AM 7.9 (H) 4.8 - 5.6 % Final    Comment:    (NOTE) Pre diabetes:          5.7%-6.4%  Diabetes:              >6.4%  Glycemic control for   <7.0% adults with diabetes     CBG: Recent Labs  Lab 05/04/21 1553 05/04/21 1934 05/04/21 2326 05/05/21 0334 05/05/21 0742  GLUCAP 194* 237* 254* 182* 179*    Critical care time: 35 minutes    Freda Jackson, MD [facility] Office: [phone]   See Amion for personal pager PCCM on call pager [phone] until 7pm. Please call [place] 7p-7a. [phone]

## 2021-05-06 ENCOUNTER — Other Ambulatory Visit: Payer: Self-pay

## 2021-05-06 DIAGNOSIS — J9602 Acute respiratory failure with hypercapnia: Secondary | ICD-10-CM | POA: Diagnosis not present

## 2021-05-06 LAB — CULTURE, BLOOD (ROUTINE X 2)
Culture: NO GROWTH
Culture: NO GROWTH

## 2021-05-06 LAB — COMPREHENSIVE METABOLIC PANEL
ALT: 33 U/L (ref 0–44)
AST: 11 U/L — ABNORMAL LOW (ref 15–41)
Albumin: 2.7 g/dL — ABNORMAL LOW (ref 3.5–5.0)
Alkaline Phosphatase: 87 U/L (ref 38–126)
Anion gap: 6 (ref 5–15)
BUN: 35 mg/dL — ABNORMAL HIGH (ref 8–23)
CO2: 34 mmol/L — ABNORMAL HIGH (ref 22–32)
Calcium: 9.1 mg/dL (ref 8.9–10.3)
Chloride: 103 mmol/L (ref 98–111)
Creatinine, Ser: 0.89 mg/dL (ref 0.61–1.24)
GFR, Estimated: >60 mL/min (ref >60)
Glucose, Bld: 241 mg/dL — ABNORMAL HIGH (ref 70–99)
Potassium: 4.2 mmol/L (ref 3.5–5.1)
Sodium: 143 mmol/L (ref 135–145)
Total Bilirubin: 0.7 mg/dL (ref 0.3–1.2)
Total Protein: 5.8 g/dL — ABNORMAL LOW (ref 6.5–8.1)

## 2021-05-06 LAB — CBC
HCT: 42.5 % (ref 39.0–52.0)
Hemoglobin: 13.8 g/dL (ref 13.0–17.0)
MCH: 28.2 pg (ref 26.0–34.0)
MCHC: 32.5 g/dL (ref 30.0–36.0)
MCV: 86.9 fL (ref 80.0–100.0)
Platelets: 267 10*3/uL (ref 150–400)
RBC: 4.89 MIL/uL (ref 4.22–5.81)
RDW: 13.2 % (ref 11.5–15.5)
WBC: 9.7 10*3/uL (ref 4.0–10.5)
nRBC: 0 % (ref 0.0–0.2)

## 2021-05-06 LAB — GLUCOSE, CAPILLARY
Glucose-Capillary: 185 mg/dL — ABNORMAL HIGH (ref 70–99)
Glucose-Capillary: 224 mg/dL — ABNORMAL HIGH (ref 70–99)
Glucose-Capillary: 282 mg/dL — ABNORMAL HIGH (ref 70–99)
Glucose-Capillary: 231 mg/dL — ABNORMAL HIGH (ref 70–99)
Glucose-Capillary: 233 mg/dL — ABNORMAL HIGH (ref 70–99)

## 2021-05-06 LAB — MAGNESIUM: Magnesium: 1.7 mg/dL (ref 1.7–2.4)

## 2021-05-06 LAB — STREP PNEUMONIAE URINARY ANTIGEN: Strep Pneumo Urinary Antigen: NEGATIVE

## 2021-05-06 LAB — HEPARIN LEVEL (UNFRACTIONATED)
Heparin Unfractionated: 0.35 IU/mL (ref 0.30–0.70)
Heparin Unfractionated: 0.21 IU/mL — ABNORMAL LOW (ref 0.30–0.70)

## 2021-05-06 MED ORDER — BISACODYL 10 MG RE SUPP: 10.0000 mg | Freq: Every day | RECTAL | Status: DC | PRN

## 2021-05-06 MED ORDER — INSULIN ASPART 100 UNIT/ML IJ SOLN
4.0000 [IU] | INTRAMUSCULAR | Status: DC
  Administered 2021-05-06 – 2021-05-07 (×6): 4 [IU] via SUBCUTANEOUS

## 2021-05-06 MED ORDER — MIRTAZAPINE 15 MG PO TABS
15.0000 mg | ORAL_TABLET | Freq: Every day | ORAL | Status: DC
  Administered 2021-05-06 – 2021-05-07 (×2): 15 mg
  Filled 2021-05-06 (×2): qty 1

## 2021-05-06 MED ORDER — ISOSORBIDE MONONITRATE 10 MG PO TABS
15.0000 mg | ORAL_TABLET | Freq: Two times a day (BID) | ORAL | Status: DC
  Administered 2021-05-06 – 2021-05-07 (×2): 15 mg
  Filled 2021-05-06 (×5): qty 1.5

## 2021-05-06 MED ORDER — METHYLPREDNISOLONE SODIUM SUCC 125 MG IJ SOLR: 60.0000 mg | Freq: Every day | INTRAMUSCULAR | Status: DC

## 2021-05-06 MED ORDER — METHYLPREDNISOLONE SODIUM SUCC 125 MG IJ SOLR
60.0000 mg | Freq: Every day | INTRAMUSCULAR | Status: DC
Start: 2021-05-08 — End: 2021-05-12
  Administered 2021-05-08 – 2021-05-12 (×5): 60 mg via INTRAVENOUS
  Filled 2021-05-06 (×7): qty 2

## 2021-05-06 MED ORDER — MAGNESIUM SULFATE 2 GM/50ML IV SOLN
2.0000 g | Freq: Once | INTRAVENOUS | Status: AC
  Administered 2021-05-06: 14:00:00 2 g via INTRAVENOUS
  Filled 2021-05-06: qty 50

## 2021-05-06 MED ORDER — ATORVASTATIN CALCIUM 40 MG PO TABS
40.0000 mg | ORAL_TABLET | Freq: Every evening | ORAL | Status: DC
  Administered 2021-05-06 – 2021-05-07 (×2): 40 mg
  Filled 2021-05-06 (×2): qty 1

## 2021-05-06 MED ORDER — SENNOSIDES 8.8 MG/5ML PO SYRP
5.0000 mL | ORAL_SOLUTION | Freq: Two times a day (BID) | ORAL | Status: DC
Start: 2021-05-06 — End: 2021-05-08
  Administered 2021-05-06 – 2021-05-07 (×3): 5 mL
  Filled 2021-05-06 (×3): qty 5

## 2021-05-06 MED ORDER — OSELTAMIVIR PHOSPHATE 75 MG PO CAPS
75.0000 mg | ORAL_CAPSULE | Freq: Two times a day (BID) | ORAL | Status: AC
  Administered 2021-05-06: 10:00:00 75 mg
  Filled 2021-05-06: qty 1

## 2021-05-06 MED ORDER — ESCITALOPRAM OXALATE 10 MG PO TABS
20.0000 mg | ORAL_TABLET | Freq: Every day | ORAL | Status: DC
  Administered 2021-05-06 – 2021-05-08 (×3): 20 mg
  Filled 2021-05-06 (×3): qty 2

## 2021-05-06 NOTE — Progress Notes (Signed)
ANTICOAGULATION CONSULT NOTE - Follow-up Consult  Pharmacy Consult for IV Heparin Indication:  Atrial Fibrillation  Allergies  Allergen Reactions   Gabapentin Anxiety    Unknown reaction   Metformin And Related Rash    Patient Measurements: Total Body Weight: 89 kg Height: 69 inches Heparin Dosing Weight: 89 kg  Vital Signs: Temp: 99.3 F (37.4 C) (11/28 1555) Temp Source: Oral (11/28 1555) BP: 126/76 (11/28 1932) Pulse Rate: 99 (11/28 1932)  Labs: Recent Labs    05/04/21 0245 05/05/21 0414 05/06/21 0305 05/06/21 1037 05/06/21 2050  HGB 13.9 14.5 13.8  --   --   HCT 42.2 43.5 42.5  --   --   PLT 278 272 267  --   --   HEPARINUNFRC 0.60 0.44 0.35  --  0.21*  CREATININE 1.41* 1.13  --  0.89  --      Estimated Creatinine Clearance: 81.2 mL/min (by C-G formula based on SCr of 0.89 mg/dL).   Medical History: Past Medical History:  Diagnosis Date   Anxiety    Asthma    Chronic lower back pain    COPD (chronic obstructive pulmonary disease) (HCC)    Coronary artery disease    a. NSTEMI 05/2014 s/p DESx2 to LAD at [facility].   Depression    Educated about COVID-19 virus infection 03/06/2020   GERD (gastroesophageal reflux disease)    High cholesterol    Hypertension    NSTEMI (non-ST elevated myocardial infarction) ([place]) 05/2014   with stent placement   Sleep apnea    Stroke ([place]) 2017   anyeusym    TIA (transient ischemic attack)    "they say I've had some mini strokes; don't know when"; denies residual on 06/22/2014)   Type II diabetes mellitus ([place])    Ulcerative colitis ([place])     Assessment: 75 yr old man presented to [facility] with acute respiratory failure, acute on chronic COPD, asthma, pneumonia, chronic dysphagia, circulatory shock, hx diastolic CHF, hx CAD, HLD, HTN, smoking hx, DM2, GERD, hx of anxiety. Pt now transferred to [facility]. Pt with atrial flutter with V rate 130. Pharmacy is consulted to dose IV heparin for a fib. Pt  was on no anticoagulant PTA. He rec'd a dose of Lovenox 40 mg for VTE prophylaxis at 1554 PM 11/23.  Heparin level subtherapeutic at 0.21 on current rate of 1300 units/hr. CBC within normal limits and stable. No bleeding or issues with infusion reported.   Goal of Therapy:  Heparin level 0.3-0.7 units/ml Monitor platelets by anticoagulation protocol: Yes   Plan:  Increase heparin infusion at 1450 units/hr Heparin level with am labs Monitor daily heparin level, CBC Monitor for bleeding Follow-up long-term anticoagulation plans   Alanda Slim, PharmD, [facility] Clinical Pharmacist Please see AMION for all Pharmacists' Contact Phone Numbers 05/06/2021, 9:51 PM

## 2021-05-06 NOTE — Progress Notes (Signed)
NAME:  Ronald [name], MRN:  [id number], DOB:  1945/11/20, LOS: 5 ADMISSION DATE:  05/01/2021, CONSULTATION DATE:  05/01/2021 REFERRING MD:  Dr. Pearline Cables, CHIEF COMPLAINT:  Respiratory Distress    History of Present Illness:  Ronald [name] is a 75 year old male with PMHx of COPD GOLD Stage 3-4, tracheal stenosis s/p trach in 2018, CAD s/p DESx2 to LAD in 2015, hypertension, hyperlipidemia, chronic respiratory failure on 2L, HFpEF, type II diabetes presenting with shortness of breath. He was given steroids and duonebs by EMS and placed on CPAP without significant intervention. Patient intubated in the ED for airway protection, given steroids, broad spectrum antibiotics and started on levophed for hypotension and admitted to [facility].    Patient had similar presentation for SOB to ED on 11/19 and recommended for admission for COPD exacerbation but declined admission. He was given prednisone and doxycycline on discharge.   Pertinent  Medical History    Anxiety     Asthma     Chronic lower back pain     COPD (chronic obstructive pulmonary disease) (HCC)     Coronary artery disease      a. NSTEMI 05/2014 s/p DESx2 to LAD at [facility].   Depression     Educated about COVID-19 virus infection 03/06/2020   GERD (gastroesophageal reflux disease)     High cholesterol     Hypertension     NSTEMI (non-ST elevated myocardial infarction) ([place]) 05/2014    with stent placement   Sleep apnea     Stroke ([place]) 2017    anyeusym    TIA (transient ischemic attack)      "they say I've had some mini strokes; don't know when"; denies residual on 06/22/2014)   Type II diabetes mellitus ([place])     Ulcerative colitis ([place])    Significant Hospital Events: Including procedures, antibiotic start and stop dates in addition to other pertinent events   11/23 intubated resp distress and severe air trapped; transferred from [facility] to [facility] ([facility]) 11/24 Continued on full vent support for acute respiratory failure and amiodarone gtt and  heparin gtt for Afib with RVR  11/25 Extubated. Re-intubated that night due to tachypnea  Interim History / Subjective:  Remains on fentanyl and precedex gtt. No events overnight. Currently on PS 5/5   Objective   Blood pressure 135/78, pulse 97, temperature 99 F (37.2 C), temperature source Core, resp. rate 20, weight 88.2 kg, SpO2 96 %.    Vent Mode: PRVC FiO2 (%):  [40 %] 40 % Set Rate:  [22 bmp] 22 bmp Vt Set:  [600 mL] 600 mL PEEP:  [5 cmH20] 5 cmH20 Pressure Support:  [5 cmH20] 5 cmH20 Plateau Pressure:  [13 cmH20-16 cmH20] 15 cmH20   Intake/Output Summary (Last 24 hours) at 05/06/2021 0817 Last data filed at 05/06/2021 0800 Gross per 24 hour  Intake 2329.4 ml  Output 2485 ml  Net -155.6 ml   Filed Weights   05/04/21 0245 05/05/21 0100 05/06/21 0500  Weight: 90.7 kg 89.9 kg 88.2 kg    Examination: General: Elderly male, on vent  HENT: NG tube and ETT in place  Lungs: Coarse breath sounds, no use of accessory muscles Cardiovascular: Irregular, HR 82, no mRG Abdomen: slightly distended, active bowels sounds  Extremities: -edema Neuro: sedated, awakens with physical stimulation, follows commands  GU: foley in place   Resolved Hospital Problem list     Assessment & Plan:  Acute on chronic hypoxic and hypercarbic respiratory failure 2/2 COPD exacerbation  in setting of Flu pneumonia with possible superimposed bacterial infection Hx of tracheal stenosis  - Extubated 11/25 but re-intubated that night due to increased work of breathing. Plan - Continue on full vent support > Daily SBT.  - Continue Tamiflu for influenza and rocephin for CAP coverage, completed azithromycin, (Urine strep and Legion pending) Resp Quant negative  - Continue scheduled duonebs/pulmicort and IV solumedrol  - Goal SpO2 88-95% - VAP prevention bundle - Currently on fentanyl, precedex gtt, goal RASS 0/1 - Diuresis as able   Aflutter with RVR  Metoprolol 66m bid at home.  Plan -  Continue home metoprolol dosing  - Continue PO amiodarone load and taper  - Continue cardiac monitoring - Continue heparin gtt >> can start oral once we ensure no need for tracheostomy    Hx of diastolic HF, CAD  Hypertension, HLD Plan - Continue Imdur - Continue aspirin - Restart Lipitor with improvement of liver function    Shock liver > Improved  In setting of circulatory shock. LFTs improving. Bilirubin wnl.  Plan - Trend LFTs >> Pending for this AM  - Avoid hepatotoxic agents as possible    Elevated sCr > Improved  Plan - Trend renal function   Tobacco use disorder  Plan - Tobacco cessation counseling once more awake    Hx of anxiety Plan - Continue home lexapro and mirtazapine    Type II DM Plan -Continue SSI and CBG monitoring  -Goal CBG <180 -Continue Semglee (first dose of the 20 units this AM)   Best Practice (right click and "Reselect all SmartList Selections" daily)   Diet/type: tubefeeds DVT prophylaxis: systemic heparin GI prophylaxis: PPI Lines: N/A Foley:  Yes, and it is still needed Code Status:  full code Last date of multidisciplinary goals of care discussion [11/25]  Labs   CBC: Recent Labs  Lab 05/01/21 1310 05/01/21 1955 05/02/21 0317 05/02/21 0503 05/03/21 0201 05/04/21 0047 05/04/21 0245 05/05/21 0414 05/06/21 0305  WBC 12.1*  --  5.0  --  8.7  --  13.7* 10.6* 9.7  NEUTROABS 8.5*  --   --   --   --   --   --   --   --   HGB 16.7   < > 14.2   < > 14.9 12.9* 13.9 14.5 13.8  HCT 50.4   < > 42.2   < > 43.1 38.0* 42.2 43.5 42.5  MCV 88.3  --  84.2  --  84.5  --  87.9 85.5 86.9  PLT 355  --  242  --  269  --  278 272 267   < > = values in this interval not displayed.    Basic Metabolic Panel: Recent Labs  Lab 05/02/21 0317 05/02/21 0844 05/03/21 0201 05/03/21 2032 05/04/21 0047 05/04/21 0245 05/04/21 1013 05/04/21 1646 05/05/21 0414 05/05/21 1750  NA  --  137 138 138 141 139  --   --  141  --   K  --  3.1* 3.5 4.1 3.6  3.8  --   --  3.9  --   CL  --  101 103 103  --  104  --   --  104  --   CO2  --  27 27 24   --  26  --   --  26  --   GLUCOSE  --  131* 167* 221*  --  210*  --   --  201*  --   BUN  --  40* 41* 36*  --  38*  --   --  40*  --   CREATININE  --  1.31* 1.35* 1.16  --  1.41*  --   --  1.13  --   CALCIUM  --  8.2* 8.3* 8.0*  --  7.9*  --   --  8.5*  --   MG  --   --  2.1  --   --  2.1 2.1 1.9 2.0 1.7  PHOS   < >  --  1.1* 3.1  --   --  3.3 3.5 3.1 3.7   < > = values in this interval not displayed.   GFR: Estimated Creatinine Clearance: 64 mL/min (by C-G formula based on SCr of 1.13 mg/dL). Recent Labs  Lab 05/01/21 1300 05/01/21 1310 05/01/21 1401 05/01/21 1639 05/02/21 0317 05/02/21 2038 05/03/21 0201 05/04/21 0245 05/05/21 0414 05/06/21 0305  PROCALCITON 0.29  --   --   --  0.41  --  0.23  --   --   --   WBC  --    < >  --   --  5.0  --  8.7 13.7* 10.6* 9.7  LATICACIDVEN  --   --  3.1* 2.1*  --  2.1*  --   --   --   --    < > = values in this interval not displayed.    Liver Function Tests: Recent Labs  Lab 05/01/21 1310 05/02/21 0317 05/02/21 0844  AST 184* 140* 95*  ALT 179* 141* 130*  ALKPHOS 94 82 78  BILITOT 1.1 0.6 1.0  PROT 7.7 5.6* 5.9*  ALBUMIN 3.9 2.8* 2.8*   No results for input(s): LIPASE, AMYLASE in the last 168 hours. No results for input(s): AMMONIA in the last 168 hours.  ABG    Component Value Date/Time   PHART 7.292 (L) 05/04/2021 0047   PCO2ART 59.1 (H) 05/04/2021 0047   PO2ART 153 (H) 05/04/2021 0047   HCO3 28.4 (H) 05/04/2021 0047   TCO2 30 05/04/2021 0047   O2SAT 99.0 05/04/2021 0047     Coagulation Profile: No results for input(s): INR, PROTIME in the last 168 hours.  Cardiac Enzymes: No results for input(s): CKTOTAL, CKMB, CKMBINDEX, TROPONINI in the last 168 hours.  HbA1C: HB A1C (BAYER DCA - WAIVED)  Date/Time Value Ref Range Status  09/12/2020 03:09 PM 6.8 <7.0 % Final    Comment:                                           Diabetic Adult            <7.0                                       Healthy Adult        4.3 - 5.7                                                           (DCCT/NGSP) American Diabetes Association's Summary of Glycemic Recommendations for Adults with Diabetes: Hemoglobin A1c <7.0%. More stringent glycemic goals (A1c <  6.0%) may further reduce complications at the cost of increased risk of hypoglycemia.   12/15/2019 12:52 PM 6.6 <7.0 % Final    Comment:                                          Diabetic Adult            <7.0                                       Healthy Adult        4.3 - 5.7                                                           (DCCT/NGSP) American Diabetes Association's Summary of Glycemic Recommendations for Adults with Diabetes: Hemoglobin A1c <7.0%. More stringent glycemic goals (A1c <6.0%) may further reduce complications at the cost of increased risk of hypoglycemia.    Hgb A1c MFr Bld  Date/Time Value Ref Range Status  05/02/2021 03:17 AM 7.5 (H) 4.8 - 5.6 % Final    Comment:    (NOTE) Pre diabetes:          5.7%-6.4%  Diabetes:              >6.4%  Glycemic control for   <7.0% adults with diabetes   12/22/2020 05:52 AM 7.9 (H) 4.8 - 5.6 % Final    Comment:    (NOTE) Pre diabetes:          5.7%-6.4%  Diabetes:              >6.4%  Glycemic control for   <7.0% adults with diabetes     CBG: Recent Labs  Lab 05/05/21 1547 05/05/21 2019 05/05/21 2357 05/06/21 0415 05/06/21 0739  GLUCAP 323* 267* 253* 185* 224*    Review of Systems:   Denies pain, denies SOB  Past Medical History:  He,  has a past medical history of Anxiety, Asthma, Chronic lower back pain, COPD (chronic obstructive pulmonary disease) ([place]), Coronary artery disease, Depression, Educated about COVID-19 virus infection (03/06/2020), GERD (gastroesophageal reflux disease), High cholesterol, Hypertension, NSTEMI (non-ST elevated myocardial infarction) ([place]) (05/2014), Sleep  apnea, Stroke ([place]) (2017), TIA (transient ischemic attack), Type II diabetes mellitus ([place]), and Ulcerative colitis ([place]).   Surgical History:   Past Surgical History:  Procedure Laterality Date   APPENDECTOMY     BIOPSY  07/20/2020   Procedure: BIOPSY;  Surgeon: Harvel Quale, MD;  [facility];  Service: Gastroenterology;;   CARDIAC CATHETERIZATION  [phone]   CHOLECYSTECTOMY     COLONOSCOPY WITH PROPOFOL N/A 07/20/2020   Procedure: COLONOSCOPY WITH PROPOFOL;  Surgeon: Harvel Quale, MD;  Location: [facility];  Service: Gastroenterology;  Laterality: N/A;  1:15   CORONARY ANGIOPLASTY WITH STENT PLACEMENT  05/2014   "2"   ESOPHAGEAL DILATION N/A 07/20/2020   Procedure: ESOPHAGEAL DILATION;  Surgeon: Harvel Quale, MD;  Location[facility];  Service: Gastroenterology;  Laterality: N/A;   ESOPHAGOGASTRODUODENOSCOPY (EGD) WITH PROPOFOL N/A 07/20/2020   Procedure: ESOPHAGOGASTRODUODENOSCOPY (EGD) WITH PROPOFOL;  Surgeon: Jenetta Downer  Roney Marion, MD;  Location: [facility];  Service: Gastroenterology;  Laterality: N/A;   LEFT HEART CATH AND CORONARY ANGIOGRAPHY N/A 05/25/2020   Procedure: LEFT HEART CATH AND CORONARY ANGIOGRAPHY;  Surgeon: Martinique, Peter M, MD;  Location: [facility];  Service: Cardiovascular;  Laterality: N/A;   POLYPECTOMY  07/20/2020   Procedure: POLYPECTOMY INTESTINAL;  Surgeon: Harvel Quale, MD;  Location: [facility];  Service: Gastroenterology;;   TUMOR EXCISION Right ~ 1999   "side of my upper head"     Social History:   reports that he has been smoking cigarettes. He started smoking about 55 years ago. He has a 72.00 pack-year smoking history. He has never used smokeless tobacco. He reports that he does not currently use alcohol. He reports that he does not use drugs.   Family History:  His family history includes CAD in his father; Cancer in his brother and brother; Dementia in his  sister; Emphysema in his sister; Leukemia in his sister; Lung cancer in his brother; Stroke in his mother.   Allergies Allergies  Allergen Reactions   Gabapentin Anxiety    Unknown reaction   Metformin And Related Rash     Home Medications  Prior to Admission medications   Medication Sig Start Date End Date Taking? Authorizing Provider  albuterol (VENTOLIN HFA) 108 (90 Base) MCG/ACT inhaler INHALE 2 PUFFS EVERY 6 HOURS AS NEEEDED Patient taking differently: Inhale 2 puffs into the lungs every 6 (six) hours as needed for wheezing or shortness of breath. 11/02/19  Yes Hawks, Christy A, FNP  ALPRAZolam (XANAX) 0.5 MG tablet Take 1 tablet (0.5 mg total) by mouth 2 (two) times daily as needed. for anxiety 01/17/21  Yes Hawks, Christy A, FNP  amLODipine (NORVASC) 5 MG tablet Take 1 tablet (5 mg total) by mouth daily. 02/04/21  Yes Hawks, Christy A, FNP  Ascorbic Acid (VITAMIN C) 1000 MG tablet Take 1,000 mg by mouth daily.   Yes [provider]  atorvastatin (LIPITOR) 40 MG tablet TAKE 1 TABLET BY MOUTH ONCE DAILY IN THE EVENING Patient taking differently: Take 40 mg by mouth every evening. 12/05/20  Yes Hawks, Christy A, FNP  cholecalciferol (VITAMIN D3) 25 MCG (1000 UNIT) tablet Take 1,000 Units by mouth daily.   Yes [provider]  doxycycline (VIBRAMYCIN) 100 MG capsule Take 1 capsule (100 mg total) by mouth 2 (two) times daily. One po bid x 7 days Patient taking differently: Take 100 mg by mouth 2 (two) times daily. For 7 days 04/27/21  Yes Milton Ferguson, MD  escitalopram (LEXAPRO) 20 MG tablet Take 1 tablet (20 mg total) by mouth daily. 02/12/21  Yes Hawks, Christy A, FNP  fluticasone (FLONASE) 50 MCG/ACT nasal spray Place 2 sprays into both nostrils daily. Patient taking differently: Place 2 sprays into both nostrils daily as needed for allergies or rhinitis. 11/15/20  Yes Gwenlyn Perking, FNP  guaiFENesin (REFENESEN 400 PO) Take 2 tablets by mouth daily.   Yes [provider]  ibuprofen (ADVIL) 200 MG tablet Take 800 mg by mouth every 6 (six) hours as needed for mild pain.   Yes [provider]  ipratropium-albuterol (DUONEB) 0.5-2.5 (3) MG/3ML SOLN USE 1 AMPULE IN NEBULIZER 4 TIMES DAILY Patient taking differently: Take 3 mLs by nebulization 4 (four) times daily as needed (wheezing/shortness of breath). 04/29/21  Yes Hawks, Christy A, FNP  isosorbide mononitrate (IMDUR) 30 MG 24 hr tablet Take 1 tablet (30 mg total) by mouth  daily. 04/09/21  Yes Branch, Alphonse Guild, MD  losartan (COZAAR) 100 MG tablet Take 1 tablet (100 mg total) by mouth daily. 02/05/21  Yes Hawks, Christy A, FNP  metoprolol tartrate (LOPRESSOR) 50 MG tablet Take 1 tablet (50 mg total) by mouth 2 (two) times daily. 03/04/21  Yes Hawks, Christy A, FNP  mirtazapine (REMERON) 15 MG tablet Take 1 tablet (15 mg total) by mouth at bedtime. 01/01/21  Yes Hawks, Christy A, FNP  nitroGLYCERIN (NITROSTAT) 0.4 MG SL tablet Place 0.4 mg under the tongue every 5 (five) minutes as needed for chest pain.   Yes [provider]  OXYGEN Inhale 3 L into the lungs at bedtime.   Yes [provider]  pantoprazole (PROTONIX) 40 MG tablet Take 1 tablet by mouth once daily Patient taking differently: 40 mg daily. 11/26/20  Yes Hawks, Christy A, FNP  predniSONE (DELTASONE) 10 MG tablet Take 2 tablets (20 mg total) by mouth daily. Patient taking differently: Take 20 mg by mouth daily. For 7 days 04/27/21  Yes Milton Ferguson, MD  Vibegron (GEMTESA) 75 MG TABS Take 75 mg by mouth daily. Patient taking differently: Take 75 mg by mouth at bedtime. 04/18/21  Yes Irine Seal, MD  aspirin EC 81 MG tablet Take 1 tablet (81 mg total) by mouth daily. Patient not taking: Reported on 05/03/2021 10/15/18   Herminio Commons, MD  Blood Glucose Monitoring Suppl (ACCU-CHEK GUIDE ME) w/Device KIT Check BS daily Dx E11.9 05/16/20   [name] A, FNP  furosemide (LASIX) 20 MG tablet Take 1 tablet (20 mg  total) by mouth 2 (two) times daily. Patient not taking: Reported on 05/03/2021 01/01/21   [name] A, FNP  glucose blood (ONE TOUCH ULTRA TEST) test strip USE TO CHECK GLUCOSE ONCE DAILY 02/11/18   Terald Sleeper, PA-C  Lancets [facility] ULTRASOFT) lancets Use as instructed   DX E11.9 05/05/17   [name] A, FNP  nicotine (NICODERM CQ - DOSED IN MG/24 HOURS) 14 mg/24hr patch Place 1 patch (14 mg total) onto the skin daily. Patient not taking: Reported on 05/03/2021 06/14/20   [name], FNP     Critical care time: 32 minutes     CRITICAL CARE Performed by: Omar Person   Total critical care time: 32 minutes  Critical care time was exclusive of separately billable procedures and treating other patients.  Critical care was necessary to treat or prevent imminent or life-threatening deterioration.  Critical care was time spent personally by me on the following activities: development of treatment plan with patient and/or surrogate as well as nursing, discussions with consultants, evaluation of patient's response to treatment, examination of patient, obtaining history from patient or surrogate, ordering and performing treatments and interventions, ordering and review of laboratory studies, ordering and review of radiographic studies, pulse oximetry and re-evaluation of patient's condition.  Hayden Pedro, AGACNP-BC [facility]  PCCM Pgr: [phone]

## 2021-05-06 NOTE — Plan of Care (Signed)

## 2021-05-06 NOTE — Progress Notes (Signed)
ANTICOAGULATION CONSULT NOTE - Follow-up Consult  Pharmacy Consult for IV Heparin Indication:  Atrial Fibrillation  Allergies  Allergen Reactions   Gabapentin Anxiety    Unknown reaction   Metformin And Related Rash    Patient Measurements: Total Body Weight: 89 kg Height: 69 inches Heparin Dosing Weight: 89 kg  Vital Signs: Temp: 99.1 F (37.3 C) (11/28 1124) Temp Source: Axillary (11/28 1124) BP: 130/72 (11/28 1208) Pulse Rate: 105 (11/28 0936)  Labs: Recent Labs    05/04/21 0245 05/05/21 0414 05/06/21 0305 05/06/21 1037  HGB 13.9 14.5 13.8  --   HCT 42.2 43.5 42.5  --   PLT 278 272 267  --   HEPARINUNFRC 0.60 0.44 0.35  --   CREATININE 1.41* 1.13  --  0.89     Estimated Creatinine Clearance: 81.2 mL/min (by C-G formula based on SCr of 0.89 mg/dL).   Medical History: Past Medical History:  Diagnosis Date   Anxiety    Asthma    Chronic lower back pain    COPD (chronic obstructive pulmonary disease) (HCC)    Coronary artery disease    a. NSTEMI 05/2014 s/p DESx2 to LAD at [facility].   Depression    Educated about COVID-19 virus infection 03/06/2020   GERD (gastroesophageal reflux disease)    High cholesterol    Hypertension    NSTEMI (non-ST elevated myocardial infarction) ([place]) 05/2014   with stent placement   Sleep apnea    Stroke ([place]) 2017   anyeusym    TIA (transient ischemic attack)    "they say I've had some mini strokes; don't know when"; denies residual on 06/22/2014)   Type II diabetes mellitus ([place])    Ulcerative colitis ([place])     Assessment: 75 yr old man presented to [facility] with acute respiratory failure, acute on chronic COPD, asthma, pneumonia, chronic dysphagia, circulatory shock, hx diastolic CHF, hx CAD, HLD, HTN, smoking hx, DM2, GERD, hx of anxiety. Pt now transferred to [facility]. Pt with atrial flutter with V rate 130. Pharmacy is consulted to dose IV heparin for a fib. Pt was on no anticoagulant PTA. He  rec'd a dose of Lovenox 40 mg for VTE prophylaxis at 1554 PM 11/23.  Heparin level remains therapeutic at 0.35 on current rate of 1250 units/hr, however, the levels have been trending down for the last 3 days. Plan to increase rate slightly to remain therapeutic. CBC within normal limits and stable.  No bleeding or issues with infusion reported.   Goal of Therapy:  Heparin level 0.3-0.7 units/ml Monitor platelets by anticoagulation protocol: Yes   Plan:  Increase heparin infusion at 1300 units/hr Monitor daily heparin level, CBC Monitor for bleeding Follow-up long-term anticoagulation plans   Varney Daily, PharmD PGY1 Pharmacy Resident  Please check AMION for all [facility] pharmacy phone numbers After 10:00 PM call main pharmacy [phone]

## 2021-05-07 ENCOUNTER — Inpatient Hospital Stay (HOSPITAL_COMMUNITY): Payer: Medicare HMO

## 2021-05-07 DIAGNOSIS — J9602 Acute respiratory failure with hypercapnia: Secondary | ICD-10-CM | POA: Diagnosis not present

## 2021-05-07 DIAGNOSIS — I5030 Unspecified diastolic (congestive) heart failure: Secondary | ICD-10-CM | POA: Diagnosis not present

## 2021-05-07 LAB — GLUCOSE, CAPILLARY
Glucose-Capillary: 168 mg/dL — ABNORMAL HIGH (ref 70–99)
Glucose-Capillary: 169 mg/dL — ABNORMAL HIGH (ref 70–99)
Glucose-Capillary: 182 mg/dL — ABNORMAL HIGH (ref 70–99)
Glucose-Capillary: 241 mg/dL — ABNORMAL HIGH (ref 70–99)
Glucose-Capillary: 250 mg/dL — ABNORMAL HIGH (ref 70–99)
Glucose-Capillary: 256 mg/dL — ABNORMAL HIGH (ref 70–99)
Glucose-Capillary: 261 mg/dL — ABNORMAL HIGH (ref 70–99)

## 2021-05-07 LAB — BASIC METABOLIC PANEL
Anion gap: 7 (ref 5–15)
BUN: 37 mg/dL — ABNORMAL HIGH (ref 8–23)
CO2: 30 mmol/L (ref 22–32)
Calcium: 8.8 mg/dL — ABNORMAL LOW (ref 8.9–10.3)
Chloride: 106 mmol/L (ref 98–111)
Creatinine, Ser: 0.9 mg/dL (ref 0.61–1.24)
GFR, Estimated: >60 mL/min (ref >60)
Glucose, Bld: 227 mg/dL — ABNORMAL HIGH (ref 70–99)
Potassium: 4.5 mmol/L (ref 3.5–5.1)
Sodium: 143 mmol/L (ref 135–145)

## 2021-05-07 LAB — ECHOCARDIOGRAM COMPLETE
AR max vel: 3.25 cm2
AV Area VTI: 3.12 cm2
AV Area mean vel: 3.21 cm2
AV Mean grad: 3 mmHg
AV Peak grad: 4.7 mmHg
Ao pk vel: 1.08 m/s
S' Lateral: 3.1 cm
Weight: 3111.13 oz

## 2021-05-07 LAB — PHOSPHORUS: Phosphorus: 4.1 mg/dL (ref 2.5–4.6)

## 2021-05-07 LAB — CBC
HCT: 38.6 % — ABNORMAL LOW (ref 39.0–52.0)
Hemoglobin: 12.6 g/dL — ABNORMAL LOW (ref 13.0–17.0)
MCH: 29.1 pg (ref 26.0–34.0)
MCHC: 32.6 g/dL (ref 30.0–36.0)
MCV: 89.1 fL (ref 80.0–100.0)
Platelets: 235 10*3/uL (ref 150–400)
RBC: 4.33 MIL/uL (ref 4.22–5.81)
RDW: 13.5 % (ref 11.5–15.5)
WBC: 10.9 10*3/uL — ABNORMAL HIGH (ref 4.0–10.5)
nRBC: 0 % (ref 0.0–0.2)

## 2021-05-07 LAB — MAGNESIUM: Magnesium: 2.1 mg/dL (ref 1.7–2.4)

## 2021-05-07 LAB — HEPARIN LEVEL (UNFRACTIONATED)
Heparin Unfractionated: 0.26 IU/mL — ABNORMAL LOW (ref 0.30–0.70)
Heparin Unfractionated: 0.5 IU/mL (ref 0.30–0.70)

## 2021-05-07 MED ORDER — DEXMEDETOMIDINE HCL IN NACL 400 MCG/100ML IV SOLN
INTRAVENOUS | Status: AC
  Filled 2021-05-07: qty 100

## 2021-05-07 MED ORDER — FUROSEMIDE 10 MG/ML IJ SOLN
INTRAMUSCULAR | Status: AC
  Filled 2021-05-07: qty 4

## 2021-05-07 MED ORDER — INSULIN ASPART 100 UNIT/ML IJ SOLN
6.0000 [IU] | INTRAMUSCULAR | Status: DC
  Administered 2021-05-07 – 2021-05-08 (×5): 6 [IU] via SUBCUTANEOUS

## 2021-05-07 MED ORDER — INSULIN GLARGINE-YFGN 100 UNIT/ML ~~LOC~~ SOLN
25.0000 [IU] | Freq: Every day | SUBCUTANEOUS | Status: DC
  Administered 2021-05-08 – 2021-05-13 (×6): 25 [IU] via SUBCUTANEOUS
  Filled 2021-05-07 (×7): qty 0.25

## 2021-05-07 MED ORDER — FUROSEMIDE 10 MG/ML IJ SOLN
40.0000 mg | Freq: Once | INTRAMUSCULAR | Status: AC
  Administered 2021-05-07: 40 mg via INTRAVENOUS

## 2021-05-07 MED ORDER — ISOSORBIDE MONONITRATE 10 MG PO TABS
15.0000 mg | ORAL_TABLET | Freq: Two times a day (BID) | ORAL | Status: DC
  Administered 2021-05-07 – 2021-05-08 (×2): 15 mg
  Filled 2021-05-07 (×4): qty 2

## 2021-05-07 MED ORDER — DEXMEDETOMIDINE HCL IN NACL 400 MCG/100ML IV SOLN
0.0000 ug/kg/h | INTRAVENOUS | Status: DC
  Administered 2021-05-07 – 2021-05-08 (×4): 1 ug/kg/h via INTRAVENOUS
  Filled 2021-05-07 (×2): qty 100

## 2021-05-07 MED ORDER — GUAIFENESIN 100 MG/5ML PO LIQD
5.0000 mL | Freq: Four times a day (QID) | ORAL | Status: DC
Start: 2021-05-07 — End: 2021-05-08
  Administered 2021-05-07 – 2021-05-08 (×4): 5 mL
  Filled 2021-05-07 (×4): qty 5

## 2021-05-07 NOTE — Progress Notes (Signed)
Pt placed on PSV 5/5 per wean protocol. Pt is tolerating well at this time. RN aware.

## 2021-05-07 NOTE — Progress Notes (Signed)
ANTICOAGULATION CONSULT NOTE - Follow Up Consult  Pharmacy Consult for heparin Indication: atrial fibrillation  Labs: Recent Labs    05/05/21 0414 05/06/21 0305 05/06/21 1037 05/06/21 2050 05/07/21 0456  HGB 14.5 13.8  --   --  12.6*  HCT 43.5 42.5  --   --  38.6*  PLT 272 267  --   --  235  HEPARINUNFRC 0.44 0.35  --  0.21* 0.26*  CREATININE 1.13  --  0.89  --  0.90    Assessment: 75yo male subtherapeutic on heparin after rate change though approaching goal; no infusion issues or signs of bleeding per RN.  Goal of Therapy:  Heparin level 0.3-0.7 units/ml   Plan:  Will increase heparin infusion by 2 units/kg/hr to 1600 units/hr and check level in 6 hours.    Wynona Neat, PharmD, BCPS  05/07/2021,6:12 AM

## 2021-05-07 NOTE — Progress Notes (Addendum)
NAME:  Ronald [name], MRN:  [id number], DOB:  Mar 26, 1946, LOS: 6 ADMISSION DATE:  05/01/2021, CONSULTATION DATE:  05/01/2021 REFERRING MD:  Dr. Pearline Cables, CHIEF COMPLAINT:  Respiratory Distress    History of Present Illness:  Ronald [name] is a 75 year old male with PMHx of COPD GOLD Stage 3-4, tracheal stenosis s/p trach in 2018, CAD s/p DESx2 to LAD in 2015, hypertension, hyperlipidemia, chronic respiratory failure on 2L, HFpEF, type II diabetes presenting with shortness of breath. He was given steroids and duonebs by EMS and placed on CPAP without significant improvement Patient intubated in the ED for airway protection, given steroids, broad spectrum antibiotics and started on levophed for hypotension and admitted to [facility].    Patient had similar presentation for SOB to ED on 11/19 and recommended for admission for COPD exacerbation but declined admission. He was given prednisone and doxycycline on discharge.   Pertinent  Medical History    Anxiety     Asthma     Chronic lower back pain     COPD (chronic obstructive pulmonary disease) (HCC)     Coronary artery disease      a. NSTEMI 05/2014 s/p DESx2 to LAD at [facility].   Depression     Educated about COVID-19 virus infection 03/06/2020   GERD (gastroesophageal reflux disease)     High cholesterol     Hypertension     NSTEMI (non-ST elevated myocardial infarction) ([place]) 05/2014    with stent placement   Sleep apnea     Stroke ([place]) 2017    anyeusym    TIA (transient ischemic attack)      "they say I've had some mini strokes; don't know when"; denies residual on 06/22/2014)   Type II diabetes mellitus ([place])     Ulcerative colitis ([place])    Significant Hospital Events: Including procedures, antibiotic start and stop dates in addition to other pertinent events   11/23 intubated resp distress and severe air trapped; transferred from [facility] to [facility] 11/24 Continued on full vent support for acute respiratory failure and amiodarone gtt and heparin  gtt for Afib with RVR  11/25 Extubated. Re-intubated that night due to tachypnea  Interim History / Subjective:  Currently weaning on 30% 5/5 with good volumes Net + 9.8  Liters, 3200 cc urine output last 24 hours WBC 10.9 with T max of 99.8 last 24 hours Flutter per tele Remains sedated on Fentanyl 50 mcg hr/ Precedex 1 mcg/kg/hr Urine strep negative  Legionella pending  Objective   Blood pressure 125/76, pulse 99, temperature 99 F (37.2 C), temperature source Oral, resp. rate 16, weight 88.2 kg, SpO2 97 %.    Vent Mode: PSV;CPAP FiO2 (%):  [30 %-40 %] 30 % Set Rate:  [22 bmp] 22 bmp Vt Set:  [600 mL] 600 mL PEEP:  [5 cmH20] 5 cmH20 Pressure Support:  [5 cmH20] 5 cmH20 Plateau Pressure:  [13 cmH20-19 cmH20] 16 cmH20   Intake/Output Summary (Last 24 hours) at 05/07/2021 8144 Last data filed at 05/07/2021 0700 Gross per 24 hour  Intake 2849.38 ml  Output 497 ml  Net 2352.38 ml   Filed Weights   05/04/21 0245 05/05/21 0100 05/06/21 0500  Weight: 90.7 kg 89.9 kg 88.2 kg    Examination: General: Elderly male, sedated on vent  HENT: NG tube and ETT in place , old trach scar noted, NG with TF infusing Lungs: Bilateral chest excursion, Coarse breath sounds, no use of accessory muscles, diminished per bases Cardiovascular: Irregular,  HR 99, no mRG Abdomen: slightly distended, non tender, active bowels sounds  Extremities: -edema, no obvious deformities, brisk capillary refill Neuro: sedated, awakens with physical stimulation, follows simple commands  GU: I&O cath ( Urinary retention )  Resolved Hospital Problem list     Assessment & Plan:  Acute on chronic hypoxic and hypercarbic respiratory failure 2/2 COPD exacerbation in setting of Flu pneumonia with possible superimposed bacterial infection Hx of tracheal stenosis  Thick secretions  Net + 9.8 L  - Extubated 11/25 but re-intubated that night due to increased work of breathing. Plan - Continue on full vent support >  Daily SBT.  - Continue Tamiflu for influenza and rocephin for CAP coverage, completed azithromycin,  (Urine strep negative and Legion pending) Resp Quant negative  - Continue scheduled duonebs/pulmicort and IV solumedrol  - Goal SpO2 88-95% - VAP prevention bundle - Currently on fentanyl, precedex gtt, goal RASS 0/1 - Diuresis as able - Will add Robitussin as mucolytic - CXR now, low dose lasix if volume overloaded to optimize for possible extubation    Aflutter with RVR  Metoprolol 31m bid at home.  Plan - Continue home metoprolol dosing  - Continue PO amiodarone load and taper  - Continue cardiac monitoring - Continue heparin gtt >> can start oral once we ensure no need for tracheostomy    Hx of diastolic HF, CAD  Hypertension, HLD Plan - Continue Imdur - Continue aspirin - BNP in am  - consider echo - Lasix 40 mg x 1 this am  - Restart Lipitor with improvement of liver function    Shock liver > Improved  In setting of circulatory shock. LFTs improving. Bilirubin wnl.  Plan - Trend LFTs >> WNL - Avoid hepatotoxic agents as possible    Elevated sCr > Improved  Plan - Trend renal function - Maintain renal perfusion , MAP goal > 65 mm Hg - Avoid nephrotoxic medications    Tobacco use disorder  Plan - Tobacco cessation counseling once more awake    Hx of anxiety Plan - Continue home lexapro and mirtazapine    Type II DM Plan -Continue SSI and CBG monitoring  -Goal CBG <180 -Continue Semglee (first dose of the 20 units this AM)   Nutrition  Plan Continue TF  Diarrhea ( was constipated) Plan Trend electrolytes Will add Lomotil if does not self resolve  Urinary retention despite Hx and home medication for overactive bladder Requiring I&O caths  Plan Continue I&O cath  If needs Lasix, will reinsert Foley cath  Best Practice (right click and "Reselect all SmartList Selections" daily)   Diet/type: tubefeeds DVT prophylaxis: systemic heparin GI  prophylaxis: PPI Lines: N/A Foley:  Yes, and it is still needed Code Status:  full code Last date of multidisciplinary goals of care discussion [11/25]  Labs   CBC: Recent Labs  Lab 05/01/21 1310 05/01/21 1955 05/03/21 0201 05/04/21 0047 05/04/21 0245 05/05/21 0414 05/06/21 0305 05/07/21 0456  WBC 12.1*   < > 8.7  --  13.7* 10.6* 9.7 10.9*  NEUTROABS 8.5*  --   --   --   --   --   --   --   HGB 16.7   < > 14.9 12.9* 13.9 14.5 13.8 12.6*  HCT 50.4   < > 43.1 38.0* 42.2 43.5 42.5 38.6*  MCV 88.3   < > 84.5  --  87.9 85.5 86.9 89.1  PLT 355   < > 269  --  278 272 267  235   < > = values in this interval not displayed.    Basic Metabolic Panel: Recent Labs  Lab 05/03/21 2032 05/04/21 0047 05/04/21 0245 05/04/21 1013 05/04/21 1646 05/05/21 0414 05/05/21 1750 05/06/21 1037 05/07/21 0456  NA 138 141 139  --   --  141  --  143 143  K 4.1 3.6 3.8  --   --  3.9  --  4.2 4.5  CL 103  --  104  --   --  104  --  103 106  CO2 24  --  26  --   --  26  --  34* 30  GLUCOSE 221*  --  210*  --   --  201*  --  241* 227*  BUN 36*  --  38*  --   --  40*  --  35* 37*  CREATININE 1.16  --  1.41*  --   --  1.13  --  0.89 0.90  CALCIUM 8.0*  --  7.9*  --   --  8.5*  --  9.1 8.8*  MG  --   --  2.1 2.1 1.9 2.0 1.7 1.7 2.1  PHOS 3.1  --   --  3.3 3.5 3.1 3.7  --  4.1   GFR: Estimated Creatinine Clearance: 80.3 mL/min (by C-G formula based on SCr of 0.9 mg/dL). Recent Labs  Lab 05/01/21 1300 05/01/21 1310 05/01/21 1401 05/01/21 1639 05/02/21 0317 05/02/21 2038 05/03/21 0201 05/04/21 0245 05/05/21 0414 05/06/21 0305 05/07/21 0456  PROCALCITON 0.29  --   --   --  0.41  --  0.23  --   --   --   --   WBC  --    < >  --   --  5.0  --  8.7 13.7* 10.6* 9.7 10.9*  LATICACIDVEN  --   --  3.1* 2.1*  --  2.1*  --   --   --   --   --    < > = values in this interval not displayed.    Liver Function Tests: Recent Labs  Lab 05/01/21 1310 05/02/21 0317 05/02/21 0844 05/06/21 1037  AST  184* 140* 95* 11*  ALT 179* 141* 130* 33  ALKPHOS 94 82 78 87  BILITOT 1.1 0.6 1.0 0.7  PROT 7.7 5.6* 5.9* 5.8*  ALBUMIN 3.9 2.8* 2.8* 2.7*   No results for input(s): LIPASE, AMYLASE in the last 168 hours. No results for input(s): AMMONIA in the last 168 hours.  ABG    Component Value Date/Time   PHART 7.292 (L) 05/04/2021 0047   PCO2ART 59.1 (H) 05/04/2021 0047   PO2ART 153 (H) 05/04/2021 0047   HCO3 28.4 (H) 05/04/2021 0047   TCO2 30 05/04/2021 0047   O2SAT 99.0 05/04/2021 0047     Coagulation Profile: No results for input(s): INR, PROTIME in the last 168 hours.  Cardiac Enzymes: No results for input(s): CKTOTAL, CKMB, CKMBINDEX, TROPONINI in the last 168 hours.  HbA1C: HB A1C (BAYER DCA - WAIVED)  Date/Time Value Ref Range Status  09/12/2020 03:09 PM 6.8 <7.0 % Final    Comment:                                          Diabetic Adult            <7.0  Healthy Adult        4.3 - 5.7                                                           (DCCT/NGSP) American Diabetes Association's Summary of Glycemic Recommendations for Adults with Diabetes: Hemoglobin A1c <7.0%. More stringent glycemic goals (A1c <6.0%) may further reduce complications at the cost of increased risk of hypoglycemia.   12/15/2019 12:52 PM 6.6 <7.0 % Final    Comment:                                          Diabetic Adult            <7.0                                       Healthy Adult        4.3 - 5.7                                                           (DCCT/NGSP) American Diabetes Association's Summary of Glycemic Recommendations for Adults with Diabetes: Hemoglobin A1c <7.0%. More stringent glycemic goals (A1c <6.0%) may further reduce complications at the cost of increased risk of hypoglycemia.    Hgb A1c MFr Bld  Date/Time Value Ref Range Status  05/02/2021 03:17 AM 7.5 (H) 4.8 - 5.6 % Final    Comment:    (NOTE) Pre diabetes:           5.7%-6.4%  Diabetes:              >6.4%  Glycemic control for   <7.0% adults with diabetes   12/22/2020 05:52 AM 7.9 (H) 4.8 - 5.6 % Final    Comment:    (NOTE) Pre diabetes:          5.7%-6.4%  Diabetes:              >6.4%  Glycemic control for   <7.0% adults with diabetes     CBG: Recent Labs  Lab 05/06/21 1553 05/06/21 1941 05/07/21 0022 05/07/21 0344 05/07/21 0738  GLUCAP 231* 233* 241* 256* 168*    Review of Systems:   Denies pain, denies SOB  Past Medical History:  He,  has a past medical history of Anxiety, Asthma, Chronic lower back pain, COPD (chronic obstructive pulmonary disease) ([place]), Coronary artery disease, Depression, Educated about COVID-19 virus infection (03/06/2020), GERD (gastroesophageal reflux disease), High cholesterol, Hypertension, NSTEMI (non-ST elevated myocardial infarction) ([place]) (05/2014), Sleep apnea, Stroke ([place]) (2017), TIA (transient ischemic attack), Type II diabetes mellitus ([place]), and Ulcerative colitis ([place]).   Surgical History:   Past Surgical History:  Procedure Laterality Date   APPENDECTOMY     BIOPSY  07/20/2020   Procedure: BIOPSY;  Surgeon: Harvel Quale, MD;  [facility];  Service: Gastroenterology;;   CARDIAC CATHETERIZATION  [phone]   CHOLECYSTECTOMY  COLONOSCOPY WITH PROPOFOL N/A 07/20/2020   Procedure: COLONOSCOPY WITH PROPOFOL;  Surgeon: Harvel Quale, MD;  Location: [facility];  Service: Gastroenterology;  Laterality: N/A;  1:15   CORONARY ANGIOPLASTY WITH STENT PLACEMENT  05/2014   "2"   ESOPHAGEAL DILATION N/A 07/20/2020   Procedure: ESOPHAGEAL DILATION;  Surgeon: Harvel Quale, MD;  [facility]: [facility];  Service: Gastroenterology;  Laterality: N/A;   ESOPHAGOGASTRODUODENOSCOPY (EGD) WITH PROPOFOL N/A 07/20/2020   Procedure: ESOPHAGOGASTRODUODENOSCOPY (EGD) WITH PROPOFOL;  Surgeon: Harvel Quale, MD;  [facility];  Service:  Gastroenterology;  Laterality: N/A;   LEFT HEART CATH AND CORONARY ANGIOGRAPHY N/A 05/25/2020   Procedure: LEFT HEART CATH AND CORONARY ANGIOGRAPHY;  Surgeon: Martinique, Peter M, MD;  Location: [facility];  Service: Cardiovascular;  Laterality: N/A;   POLYPECTOMY  07/20/2020   Procedure: POLYPECTOMY INTESTINAL;  Surgeon: Harvel Quale, MD;  Location: [facility];  Service: Gastroenterology;;   TUMOR EXCISION Right ~ 1999   "side of my upper head"     Social History:   reports that he has been smoking cigarettes. He started smoking about 55 years ago. He has a 72.00 pack-year smoking history. He has never used smokeless tobacco. He reports that he does not currently use alcohol. He reports that he does not use drugs.   Family History:  His family history includes CAD in his father; Cancer in his brother and brother; Dementia in his sister; Emphysema in his sister; Leukemia in his sister; Lung cancer in his brother; Stroke in his mother.   Allergies Allergies  Allergen Reactions   Gabapentin Anxiety    Unknown reaction   Metformin And Related Rash     Home Medications  Prior to Admission medications   Medication Sig Start Date End Date Taking? Authorizing Provider  albuterol (VENTOLIN HFA) 108 (90 Base) MCG/ACT inhaler INHALE 2 PUFFS EVERY 6 HOURS AS NEEEDED Patient taking differently: Inhale 2 puffs into the lungs every 6 (six) hours as needed for wheezing or shortness of breath. 11/02/19  Yes Hawks, Christy A, FNP  ALPRAZolam (XANAX) 0.5 MG tablet Take 1 tablet (0.5 mg total) by mouth 2 (two) times daily as needed. for anxiety 01/17/21  Yes Hawks, Christy A, FNP  amLODipine (NORVASC) 5 MG tablet Take 1 tablet (5 mg total) by mouth daily. 02/04/21  Yes Hawks, Christy A, FNP  Ascorbic Acid (VITAMIN C) 1000 MG tablet Take 1,000 mg by mouth daily.   Yes [provider]  atorvastatin (LIPITOR) 40 MG tablet TAKE 1 TABLET BY MOUTH ONCE DAILY IN THE EVENING Patient  taking differently: Take 40 mg by mouth every evening. 12/05/20  Yes Hawks, Christy A, FNP  cholecalciferol (VITAMIN D3) 25 MCG (1000 UNIT) tablet Take 1,000 Units by mouth daily.   Yes [provider]  doxycycline (VIBRAMYCIN) 100 MG capsule Take 1 capsule (100 mg total) by mouth 2 (two) times daily. One po bid x 7 days Patient taking differently: Take 100 mg by mouth 2 (two) times daily. For 7 days 04/27/21  Yes Milton Ferguson, MD  escitalopram (LEXAPRO) 20 MG tablet Take 1 tablet (20 mg total) by mouth daily. 02/12/21  Yes Hawks, Christy A, FNP  fluticasone (FLONASE) 50 MCG/ACT nasal spray Place 2 sprays into both nostrils daily. Patient taking differently: Place 2 sprays into both nostrils daily as needed for allergies or rhinitis. 11/15/20  Yes Gwenlyn Perking, FNP  guaiFENesin (REFENESEN 400 PO) Take 2 tablets by mouth daily.  Yes [provider]  ibuprofen (ADVIL) 200 MG tablet Take 800 mg by mouth every 6 (six) hours as needed for mild pain.   Yes [provider]  ipratropium-albuterol (DUONEB) 0.5-2.5 (3) MG/3ML SOLN USE 1 AMPULE IN NEBULIZER 4 TIMES DAILY Patient taking differently: Take 3 mLs by nebulization 4 (four) times daily as needed (wheezing/shortness of breath). 04/29/21  Yes Hawks, Christy A, FNP  isosorbide mononitrate (IMDUR) 30 MG 24 hr tablet Take 1 tablet (30 mg total) by mouth daily. 04/09/21  Yes Branch, Alphonse Guild, MD  losartan (COZAAR) 100 MG tablet Take 1 tablet (100 mg total) by mouth daily. 02/05/21  Yes Hawks, Christy A, FNP  metoprolol tartrate (LOPRESSOR) 50 MG tablet Take 1 tablet (50 mg total) by mouth 2 (two) times daily. 03/04/21  Yes Hawks, Christy A, FNP  mirtazapine (REMERON) 15 MG tablet Take 1 tablet (15 mg total) by mouth at bedtime. 01/01/21  Yes Hawks, Christy A, FNP  nitroGLYCERIN (NITROSTAT) 0.4 MG SL tablet Place 0.4 mg under the tongue every 5 (five) minutes as needed for chest pain.   Yes [provider]  OXYGEN Inhale  3 L into the lungs at bedtime.   Yes [provider]  pantoprazole (PROTONIX) 40 MG tablet Take 1 tablet by mouth once daily Patient taking differently: 40 mg daily. 11/26/20  Yes Hawks, Christy A, FNP  predniSONE (DELTASONE) 10 MG tablet Take 2 tablets (20 mg total) by mouth daily. Patient taking differently: Take 20 mg by mouth daily. For 7 days 04/27/21  Yes Milton Ferguson, MD  Vibegron (GEMTESA) 75 MG TABS Take 75 mg by mouth daily. Patient taking differently: Take 75 mg by mouth at bedtime. 04/18/21  Yes Irine Seal, MD  aspirin EC 81 MG tablet Take 1 tablet (81 mg total) by mouth daily. Patient not taking: Reported on 05/03/2021 10/15/18   Herminio Commons, MD  Blood Glucose Monitoring Suppl (ACCU-CHEK GUIDE ME) w/Device KIT Check BS daily Dx E11.9 05/16/20   [name] A, FNP  furosemide (LASIX) 20 MG tablet Take 1 tablet (20 mg total) by mouth 2 (two) times daily. Patient not taking: Reported on 05/03/2021 01/01/21   [name] A, FNP  glucose blood (ONE TOUCH ULTRA TEST) test strip USE TO CHECK GLUCOSE ONCE DAILY 02/11/18   Terald Sleeper, PA-C  Lancets [facility] ULTRASOFT) lancets Use as instructed   DX E11.9 05/05/17   [name] A, FNP  nicotine (NICODERM CQ - DOSED IN MG/24 HOURS) 14 mg/24hr patch Place 1 patch (14 mg total) onto the skin daily. Patient not taking: Reported on 05/03/2021 06/14/20   [name], FNP     Critical care time: 40 minutes     CRITICAL CARE Performed by: Magdalen Spatz   Total critical care time: 40 minutes  Critical care time was exclusive of separately billable procedures and treating other patients.  Critical care was necessary to treat or prevent imminent or life-threatening deterioration.  Critical care was time spent personally by me on the following activities: development of treatment plan with patient and/or surrogate as well as nursing, discussions with consultants, evaluation of patient's response to treatment,  examination of patient, obtaining history from patient or surrogate, ordering and performing treatments and interventions, ordering and review of laboratory studies, ordering and review of radiographic studies, pulse oximetry and re-evaluation of patient's condition.  Magdalen Spatz, MSN, AGACNP-BC [place] See Amion for personal pager PCCM on call pager [phone]  05/07/2021 9:04 AM   Patient seen, independently examined, care plan was formulated and discussed with Eric Form as per documentation  Patient with respiratory failure Failed extubation  Intubated, sedated Tolerating weaning Coarse breath sounds Bowel sounds appreciated  Labs reviewed  Acute hypercapnic and hypoxemic respiratory failure Chronic obstructive pulmonary disease Recently failed extubation- -Continue vent support -With associated history of diastolic heart failure and been fluid overloaded, will try to diurese prior to trying to extubate  Diastolic heart failure Hypertension -Continue home medications  AKI -Improved  History of anxiety Type 2 diabetes  Elevated LFTs  A flutter with RVR -Continue metoprolol  Urinary retention -Place Foley while we are trying to diurese patient  The patient is critically ill with multiple organ systems failure and requires high complexity decision making for assessment and support, frequent evaluation and titration of therapies, application of advanced monitoring technologies and extensive interpretation of multiple databases. Critical Care Time devoted to patient care services described in this note independent of APP/resident time (if applicable)  is 32 minutes.   Sherrilyn Rist MD [facility] Personal pager: See Amion If unanswered, please page CCM On-call: [phone]

## 2021-05-07 NOTE — Progress Notes (Signed)
ANTICOAGULATION CONSULT NOTE - Follow-up Consult  Pharmacy Consult for IV Heparin Indication:  Atrial Fibrillation  Allergies  Allergen Reactions   Gabapentin Anxiety    Unknown reaction   Metformin And Related Rash    Patient Measurements: Total Body Weight: 89 kg Height: 69 inches Heparin Dosing Weight: 89 kg  Vital Signs: Temp: 99.7 F (37.6 C) (11/29 1124) Temp Source: Oral (11/29 1124) BP: 118/66 (11/29 1330) Pulse Rate: 97 (11/29 1440)  Labs: Recent Labs    05/05/21 0414 05/06/21 0305 05/06/21 1037 05/06/21 2050 05/07/21 0456 05/07/21 1309  HGB 14.5 13.8  --   --  12.6*  --   HCT 43.5 42.5  --   --  38.6*  --   PLT 272 267  --   --  235  --   HEPARINUNFRC 0.44 0.35  --  0.21* 0.26* 0.50  CREATININE 1.13  --  0.89  --  0.90  --      Estimated Creatinine Clearance: 80.3 mL/min (by C-G formula based on SCr of 0.9 mg/dL).   Medical History: Past Medical History:  Diagnosis Date   Anxiety    Asthma    Chronic lower back pain    COPD (chronic obstructive pulmonary disease) (HCC)    Coronary artery disease    a. NSTEMI 05/2014 s/p DESx2 to LAD at [facility].   Depression    Educated about COVID-19 virus infection 03/06/2020   GERD (gastroesophageal reflux disease)    High cholesterol    Hypertension    NSTEMI (non-ST elevated myocardial infarction) ([place]) 05/2014   with stent placement   Sleep apnea    Stroke ([place]) 2017   anyeusym    TIA (transient ischemic attack)    "they say I've had some mini strokes; don't know when"; denies residual on 06/22/2014)   Type II diabetes mellitus ([place])    Ulcerative colitis ([place])     Assessment: 75 yr old man presented to [facility] with acute respiratory failure, acute on chronic COPD, asthma, pneumonia, chronic dysphagia, circulatory shock, hx diastolic CHF, hx CAD, HLD, HTN, smoking hx, DM2, GERD, hx of anxiety. Pt now transferred to [facility]. Pt with atrial flutter with V rate 130. Pharmacy is  consulted to dose IV heparin for a fib. Pt was on no anticoagulant PTA. He rec'd a dose of Lovenox 40 mg for VTE prophylaxis at 1554 PM 11/23.  Heparin level was subtherapeutic at 0.26 and then became therapeutic at 0.5 on rate of 1600 units/hr. Since there was a large increase in both the rate and the level, decreasing the rate slightly to prevent overshooting the therapeutic goal range. CBC within normal limits and stable. No bleeding or issues with infusion reported.   Goal of Therapy:  Heparin level 0.3-0.7 units/ml Monitor platelets by anticoagulation protocol: Yes   Plan:  Decrease heparin infusion to 1500 units/hr 2300 heparin level Monitor daily heparin level, CBC Monitor for bleeding Follow-up long-term anticoagulation plans   Varney Daily, PharmD PGY1 Pharmacy Resident  Please check AMION for all [facility] pharmacy phone numbers After 10:00 PM call main pharmacy [phone]

## 2021-05-07 NOTE — Progress Notes (Signed)
Pt placed back on full vent support due to HR in the 150's and increased WOB. RN aware.

## 2021-05-07 NOTE — Progress Notes (Signed)
  Echocardiogram 2D Echocardiogram has been performed.  Merrie Roof F 05/07/2021, 4:05 PM

## 2021-05-08 ENCOUNTER — Inpatient Hospital Stay (HOSPITAL_COMMUNITY): Payer: Medicare HMO

## 2021-05-08 DIAGNOSIS — J9602 Acute respiratory failure with hypercapnia: Secondary | ICD-10-CM | POA: Diagnosis not present

## 2021-05-08 LAB — CBC WITH DIFFERENTIAL/PLATELET
Abs Immature Granulocytes: 0.07 10*3/uL (ref 0.00–0.07)
Basophils Absolute: 0 10*3/uL (ref 0.0–0.1)
Basophils Relative: 0 %
Eosinophils Absolute: 0 10*3/uL (ref 0.0–0.5)
Eosinophils Relative: 0 %
HCT: 38.3 % — ABNORMAL LOW (ref 39.0–52.0)
Hemoglobin: 12.4 g/dL — ABNORMAL LOW (ref 13.0–17.0)
Immature Granulocytes: 1 %
Lymphocytes Relative: 3 %
Lymphs Abs: 0.3 10*3/uL — ABNORMAL LOW (ref 0.7–4.0)
MCH: 28.8 pg (ref 26.0–34.0)
MCHC: 32.4 g/dL (ref 30.0–36.0)
MCV: 89.1 fL (ref 80.0–100.0)
Monocytes Absolute: 0.5 10*3/uL (ref 0.1–1.0)
Monocytes Relative: 5 %
Neutro Abs: 10.3 10*3/uL — ABNORMAL HIGH (ref 1.7–7.7)
Neutrophils Relative %: 91 %
Platelets: 252 10*3/uL (ref 150–400)
RBC: 4.3 MIL/uL (ref 4.22–5.81)
RDW: 13.7 % (ref 11.5–15.5)
WBC: 11.2 10*3/uL — ABNORMAL HIGH (ref 4.0–10.5)
nRBC: 0 % (ref 0.0–0.2)

## 2021-05-08 LAB — COMPREHENSIVE METABOLIC PANEL
ALT: 26 U/L (ref 0–44)
AST: 13 U/L — ABNORMAL LOW (ref 15–41)
Albumin: 2.3 g/dL — ABNORMAL LOW (ref 3.5–5.0)
Alkaline Phosphatase: 59 U/L (ref 38–126)
Anion gap: 9 (ref 5–15)
BUN: 38 mg/dL — ABNORMAL HIGH (ref 8–23)
CO2: 30 mmol/L (ref 22–32)
Calcium: 8.7 mg/dL — ABNORMAL LOW (ref 8.9–10.3)
Chloride: 103 mmol/L (ref 98–111)
Creatinine, Ser: 0.98 mg/dL (ref 0.61–1.24)
GFR, Estimated: >60 mL/min (ref >60)
Glucose, Bld: 273 mg/dL — ABNORMAL HIGH (ref 70–99)
Potassium: 4.5 mmol/L (ref 3.5–5.1)
Sodium: 142 mmol/L (ref 135–145)
Total Bilirubin: 0.6 mg/dL (ref 0.3–1.2)
Total Protein: 5 g/dL — ABNORMAL LOW (ref 6.5–8.1)

## 2021-05-08 LAB — POCT I-STAT 7, (LYTES, BLD GAS, ICA,H+H)
Acid-Base Excess: 10 mmol/L — ABNORMAL HIGH (ref 0.0–2.0)
Acid-Base Excess: 13 mmol/L — ABNORMAL HIGH (ref 0.0–2.0)
Bicarbonate: 37.9 mmol/L — ABNORMAL HIGH (ref 20.0–28.0)
Bicarbonate: 38.6 mmol/L — ABNORMAL HIGH (ref 20.0–28.0)
Calcium, Ion: 1.25 mmol/L (ref 1.15–1.40)
Calcium, Ion: 1.24 mmol/L (ref 1.15–1.40)
HCT: 41 % (ref 39.0–52.0)
HCT: 36 % — ABNORMAL LOW (ref 39.0–52.0)
Hemoglobin: 13.9 g/dL (ref 13.0–17.0)
Hemoglobin: 12.2 g/dL — ABNORMAL LOW (ref 13.0–17.0)
O2 Saturation: 94 %
O2 Saturation: 92 %
Patient temperature: 98.9
Patient temperature: 97.8
Potassium: 4.9 mmol/L (ref 3.5–5.1)
Potassium: 4.3 mmol/L (ref 3.5–5.1)
Sodium: 145 mmol/L (ref 135–145)
Sodium: 144 mmol/L (ref 135–145)
TCO2: 40 mmol/L — ABNORMAL HIGH (ref 22–32)
TCO2: 40 mmol/L — ABNORMAL HIGH (ref 22–32)
pCO2 arterial: 63.9 mmHg — ABNORMAL HIGH (ref 32.0–48.0)
pCO2 arterial: 53.4 mmHg — ABNORMAL HIGH (ref 32.0–48.0)
pH, Arterial: 7.382 (ref 7.350–7.450)
pH, Arterial: 7.465 — ABNORMAL HIGH (ref 7.350–7.450)
pO2, Arterial: 76 mmHg — ABNORMAL LOW (ref 83.0–108.0)
pO2, Arterial: 61 mmHg — ABNORMAL LOW (ref 83.0–108.0)

## 2021-05-08 LAB — LEGIONELLA PNEUMOPHILA SEROGP 1 UR AG: L. pneumophila Serogp 1 Ur Ag: NEGATIVE

## 2021-05-08 LAB — GLUCOSE, CAPILLARY
Glucose-Capillary: 152 mg/dL — ABNORMAL HIGH (ref 70–99)
Glucose-Capillary: 164 mg/dL — ABNORMAL HIGH (ref 70–99)
Glucose-Capillary: 216 mg/dL — ABNORMAL HIGH (ref 70–99)
Glucose-Capillary: 197 mg/dL — ABNORMAL HIGH (ref 70–99)
Glucose-Capillary: 147 mg/dL — ABNORMAL HIGH (ref 70–99)
Glucose-Capillary: 200 mg/dL — ABNORMAL HIGH (ref 70–99)
Glucose-Capillary: 165 mg/dL — ABNORMAL HIGH (ref 70–99)

## 2021-05-08 LAB — BRAIN NATRIURETIC PEPTIDE: B Natriuretic Peptide: 115.7 pg/mL — ABNORMAL HIGH (ref 0.0–100.0)

## 2021-05-08 LAB — HEPARIN LEVEL (UNFRACTIONATED): Heparin Unfractionated: 0.38 IU/mL (ref 0.30–0.70)

## 2021-05-08 LAB — AMMONIA: Ammonia: 22 umol/L (ref 9–35)

## 2021-05-08 LAB — PHOSPHORUS: Phosphorus: 4.6 mg/dL (ref 2.5–4.6)

## 2021-05-08 LAB — MAGNESIUM: Magnesium: 1.9 mg/dL (ref 1.7–2.4)

## 2021-05-08 MED ORDER — DOCUSATE SODIUM 100 MG PO CAPS: 100.0000 mg | ORAL_CAPSULE | Freq: Two times a day (BID) | ORAL | Status: DC

## 2021-05-08 MED ORDER — PANTOPRAZOLE SODIUM 40 MG PO TBEC: 40.0000 mg | DELAYED_RELEASE_TABLET | Freq: Every day | ORAL | Status: DC

## 2021-05-08 MED ORDER — LORAZEPAM 2 MG/ML IJ SOLN
2.0000 mg | INTRAMUSCULAR | Status: DC | PRN
  Administered 2021-05-09 – 2021-05-15 (×7): 2 mg via INTRAVENOUS
  Filled 2021-05-08 (×7): qty 1

## 2021-05-08 MED ORDER — ISOSORBIDE MONONITRATE 10 MG PO TABS
15.0000 mg | ORAL_TABLET | Freq: Two times a day (BID) | ORAL | Status: DC
  Filled 2021-05-08 (×2): qty 1.5

## 2021-05-08 MED ORDER — GUAIFENESIN 100 MG/5ML PO LIQD: 5.0000 mL | Freq: Four times a day (QID) | ORAL | Status: DC

## 2021-05-08 MED ORDER — ACETAMINOPHEN 325 MG PO TABS: 650.0000 mg | ORAL_TABLET | ORAL | Status: DC | PRN

## 2021-05-08 MED ORDER — SUCCINYLCHOLINE CHLORIDE 200 MG/10ML IV SOSY
PREFILLED_SYRINGE | INTRAVENOUS | Status: AC
  Filled 2021-05-08: qty 10

## 2021-05-08 MED ORDER — AMIODARONE HCL 200 MG PO TABS: 400.0000 mg | ORAL_TABLET | Freq: Two times a day (BID) | ORAL | Status: DC

## 2021-05-08 MED ORDER — ROCURONIUM BROMIDE 10 MG/ML (PF) SYRINGE
PREFILLED_SYRINGE | INTRAVENOUS | Status: AC
  Filled 2021-05-08: qty 10

## 2021-05-08 MED ORDER — METHYLPREDNISOLONE SODIUM SUCC 125 MG IJ SOLR
60.0000 mg | Freq: Once | INTRAMUSCULAR | Status: AC
  Administered 2021-05-08: 60 mg via INTRAVENOUS

## 2021-05-08 MED ORDER — DEXMEDETOMIDINE HCL IN NACL 400 MCG/100ML IV SOLN
0.4000 ug/kg/h | INTRAVENOUS | Status: DC
  Administered 2021-05-08: 1.2 ug/kg/h via INTRAVENOUS
  Administered 2021-05-08: 0.5 ug/kg/h via INTRAVENOUS
  Administered 2021-05-08: 0.6 ug/kg/h via INTRAVENOUS
  Administered 2021-05-09: 0.7 ug/kg/h via INTRAVENOUS
  Administered 2021-05-09: 0.6 ug/kg/h via INTRAVENOUS
  Administered 2021-05-10 (×4): 0.8 ug/kg/h via INTRAVENOUS
  Administered 2021-05-10: 0.9 ug/kg/h via INTRAVENOUS
  Administered 2021-05-11 (×2): 0.8 ug/kg/h via INTRAVENOUS
  Administered 2021-05-11: 0.5 ug/kg/h via INTRAVENOUS
  Administered 2021-05-11 (×2): 0.9 ug/kg/h via INTRAVENOUS
  Administered 2021-05-12: 19:00:00 0.5 ug/kg/h via INTRAVENOUS
  Administered 2021-05-12 (×2): 0.8 ug/kg/h via INTRAVENOUS
  Administered 2021-05-13: 0.9 ug/kg/h via INTRAVENOUS
  Administered 2021-05-13: 0.7 ug/kg/h via INTRAVENOUS
  Administered 2021-05-13: 0.5 ug/kg/h via INTRAVENOUS
  Administered 2021-05-14 (×2): 0.9 ug/kg/h via INTRAVENOUS
  Administered 2021-05-14: 0.7 ug/kg/h via INTRAVENOUS
  Administered 2021-05-15: 0.9 ug/kg/h via INTRAVENOUS
  Filled 2021-05-08 (×27): qty 100

## 2021-05-08 MED ORDER — MIRTAZAPINE 15 MG PO TABS: 15.0000 mg | ORAL_TABLET | Freq: Every day | ORAL | Status: DC

## 2021-05-08 MED ORDER — ORAL CARE MOUTH RINSE
15.0000 mL | Freq: Two times a day (BID) | OROMUCOSAL | Status: DC
  Administered 2021-05-08 – 2021-06-06 (×59): 15 mL via OROMUCOSAL

## 2021-05-08 MED ORDER — FENTANYL CITRATE (PF) 100 MCG/2ML IJ SOLN
50.0000 ug | Freq: Once | INTRAMUSCULAR | Status: AC
  Administered 2021-05-08: 50 ug via INTRAVENOUS
  Filled 2021-05-08: qty 2

## 2021-05-08 MED ORDER — ETOMIDATE 2 MG/ML IV SOLN
INTRAVENOUS | Status: AC
  Filled 2021-05-08: qty 20

## 2021-05-08 MED ORDER — FUROSEMIDE 10 MG/ML IJ SOLN
40.0000 mg | Freq: Once | INTRAMUSCULAR | Status: AC
  Administered 2021-05-08: 40 mg via INTRAVENOUS
  Filled 2021-05-08: qty 4

## 2021-05-08 MED ORDER — ESCITALOPRAM OXALATE 10 MG PO TABS: 20.0000 mg | ORAL_TABLET | Freq: Every day | ORAL | Status: DC

## 2021-05-08 MED ORDER — METOPROLOL TARTRATE 25 MG PO TABS: 50.0000 mg | ORAL_TABLET | Freq: Two times a day (BID) | ORAL | Status: DC

## 2021-05-08 MED ORDER — LORAZEPAM 2 MG/ML IJ SOLN
2.0000 mg | Freq: Once | INTRAMUSCULAR | Status: AC
Start: 2021-05-08 — End: 2021-05-08
  Administered 2021-05-08: 2 mg via INTRAVENOUS

## 2021-05-08 MED ORDER — OLANZAPINE 10 MG IM SOLR
10.0000 mg | Freq: Once | INTRAMUSCULAR | Status: AC | PRN
  Administered 2021-05-08: 10 mg via INTRAMUSCULAR
  Filled 2021-05-08: qty 10

## 2021-05-08 MED ORDER — TAMSULOSIN HCL 0.4 MG PO CAPS
0.4000 mg | ORAL_CAPSULE | Freq: Every day | ORAL | Status: DC
  Filled 2021-05-08: qty 1

## 2021-05-08 MED ORDER — AMIODARONE IV BOLUS ONLY 150 MG/100ML
150.0000 mg | Freq: Once | INTRAVENOUS | Status: AC
  Administered 2021-05-08: 150 mg via INTRAVENOUS

## 2021-05-08 MED ORDER — KETAMINE HCL 50 MG/5ML IJ SOSY
PREFILLED_SYRINGE | INTRAMUSCULAR | Status: AC
  Filled 2021-05-08: qty 5

## 2021-05-08 MED ORDER — ATORVASTATIN CALCIUM 40 MG PO TABS: 40.0000 mg | ORAL_TABLET | Freq: Every evening | ORAL | Status: DC

## 2021-05-08 MED ORDER — POLYETHYLENE GLYCOL 3350 17 G PO PACK: 17.0000 g | PACK | Freq: Every day | ORAL | Status: DC

## 2021-05-08 MED ORDER — MIDAZOLAM HCL 2 MG/2ML IJ SOLN
INTRAMUSCULAR | Status: AC
  Filled 2021-05-08: qty 2

## 2021-05-08 MED ORDER — SENNOSIDES 8.8 MG/5ML PO SYRP: 5.0000 mL | ORAL_SOLUTION | Freq: Two times a day (BID) | ORAL | Status: DC

## 2021-05-08 MED ORDER — AMIODARONE HCL 200 MG PO TABS: 100.0000 mg | ORAL_TABLET | Freq: Every day | ORAL | Status: DC

## 2021-05-08 MED ORDER — ASPIRIN 81 MG PO CHEW: 81.0000 mg | CHEWABLE_TABLET | Freq: Every day | ORAL | Status: DC

## 2021-05-08 MED ORDER — FENTANYL CITRATE (PF) 100 MCG/2ML IJ SOLN
INTRAMUSCULAR | Status: AC
  Filled 2021-05-08: qty 2

## 2021-05-08 MED ORDER — POLYETHYLENE GLYCOL 3350 17 G PO PACK: 17.0000 g | PACK | Freq: Every day | ORAL | Status: DC | PRN

## 2021-05-08 NOTE — Progress Notes (Signed)
Multiple evaluations  Respiratory discomfort  ABG noted  Hypercapnic failure  On BiPAP at present, tolerating it well heart rate, respiratory rate settled down with BiPAP  Informed about a flutter with RVR  -We will give a bolus dose of amiodarone 150  Critically ill Risk of BiPAP failure is present  May need reintubation Unfortunately did fail extubation recently, if he were to need reintubation will need a tracheostomy  Has a past history of tracheal stenosis  Additional critical care time 30 minutes

## 2021-05-08 NOTE — Progress Notes (Addendum)
[facility] Progress Note Patient Name: Ronald [name] DOB: [birth year] MRN: [id number]   Date of Service  05/08/2021  HPI/Events of Note  Notified of somnolence.   Pt was extubated today and placed on BIPAP.  He received ativan at 1734 and is on precedex gtt.  BP 156/91, HR 71, RR 28, O2 sats 93%.  eICU Interventions  Get stat ABG. Titrate precedex down.      Intervention Category Intermediate Interventions: Change in mental status - evaluation and management  Elsie Lincoln 05/08/2021, 7:44 PM  9:20 PM ABG 7.465/53.4/61 on BIPAP.  Pt is more responsive but still requires sternal rubbing.   Plan> Please titrate precedex down.

## 2021-05-08 NOTE — Progress Notes (Addendum)
NAME:  Ronald [name], MRN:  [id number], DOB:  03-30-1946, LOS: 7 ADMISSION DATE:  05/01/2021, CONSULTATION DATE:  05/01/2021 REFERRING MD:  Dr. Pearline Cables, CHIEF COMPLAINT:  Respiratory Distress    History of Present Illness:  [name] is a 75 year old male with PMHx of COPD GOLD Stage 3-4, tracheal stenosis s/p trach in 2018, CAD s/p DESx2 to LAD in 2015, hypertension, hyperlipidemia, chronic respiratory failure on 2L, HFpEF, type II diabetes presenting with shortness of breath. He was given steroids and duonebs by EMS and placed on CPAP without significant improvement Patient intubated in the ED for airway protection, given steroids, broad spectrum antibiotics and started on levophed for hypotension and admitted to [facility].    Patient had similar presentation for SOB to ED on 11/19 and recommended for admission for COPD exacerbation but declined admission. He was given prednisone and doxycycline on discharge.   Pertinent  Medical History    Anxiety     Asthma     Chronic lower back pain     COPD (chronic obstructive pulmonary disease) (HCC)     Coronary artery disease      a. NSTEMI 05/2014 s/p DESx2 to LAD at [facility].   Depression     Educated about COVID-19 virus infection 03/06/2020   GERD (gastroesophageal reflux disease)     High cholesterol     Hypertension     NSTEMI (non-ST elevated myocardial infarction) ([place]) 05/2014    with stent placement   Sleep apnea     Stroke ([place]) 2017    anyeusym    TIA (transient ischemic attack)      "they say I've had some mini strokes; don't know when"; denies residual on 06/22/2014)   Type II diabetes mellitus ([place])     Ulcerative colitis ([place])    Significant Hospital Events: Including procedures, antibiotic start and stop dates in addition to other pertinent events   11/23 intubated resp distress and severe air trapped; transferred from [facility] to [facility] 11/24 Continued on full vent support for acute respiratory failure and amiodarone gtt and heparin  gtt for Afib with RVR  11/25 Extubated. Re-intubated that night due to tachypnea 11/30 attempt at weaning  Interim History / Subjective:  Currently weaning on 30% 5/5 with good volumes Net + 7.9 liters No significant leukocytosis, afebrile on vent, sedated  Objective   Blood pressure 130/74, pulse 97, temperature 99 F (37.2 C), temperature source Axillary, resp. rate (!) 22, weight 88.2 kg, SpO2 91 %.    Vent Mode: PRVC FiO2 (%):  [30 %] 30 % Set Rate:  [22 bmp] 22 bmp Vt Set:  [600 mL] 600 mL PEEP:  [5 cmH20] 5 cmH20 Pressure Support:  [5 cmH20] 5 cmH20 Plateau Pressure:  [17 cmH20-22 cmH20] 22 cmH20   Intake/Output Summary (Last 24 hours) at 05/08/2021 2336 Last data filed at 05/08/2021 0600 Gross per 24 hour  Intake 2269.6 ml  Output 2110 ml  Net 159.6 ml   Filed Weights   05/04/21 0245 05/05/21 0100 05/06/21 0500  Weight: 90.7 kg 89.9 kg 88.2 kg    Examination: General: Elderly, sedated arouses easily HENT: Moist oral mucosa, endotracheal tube in place Lungs: Coarse breath sounds at the bases Cardiovascular: S1-S2 appreciated, irregular Abdomen: Bowel sounds appreciated Extremities: No edema Neuro: Awakens, follows simple commands GU: I&O cath ( Urinary retention )  Resolved Hospital Problem list     Assessment & Plan:   Acute on chronic hypoxic and hypercapnic respiratory failure Chronic  obstructive pulmonary disease History of tracheal stenosis -Weaning -On Tamiflu, Rocephin, completed azithromycin -Pneumococcal antigen negative -Legionella pending -VAP bundle in place -Diuresis as able  A flutter with RVR -Continue metoprolol -Continue amiodarone -Continue heparin  History of diastolic heart failure Hypertension -Imdur -Repeat Lasix today -Follow echo-echocardiogram with normal function  Shock liver In the setting of circulatory shock -Trend LFTs  Acute kidney injury -Avoid nephrotoxic medications -Maintain renal perfusion  Type 2  diabetes -SSI monitoring -Blood glucose level less than 180 -Continue Semglee  Nutrition -Continue nutritional support  Urinary retention History of overactive bladder however noted to be requiring catheterization -Start Urecholine-we will plan to start following extubation if able to extubate and tube if not able to  Today's goal will be to possibly wean and extubate  Best Practice (right click and "Reselect all SmartList Selections" daily)   Diet/type: tubefeeds DVT prophylaxis: systemic heparin GI prophylaxis: PPI Lines: N/A Foley:  Yes, and it is still needed Code Status:  full code Last date of multidisciplinary goals of care discussion [11/25]  Labs   CBC: Recent Labs  Lab 05/01/21 1310 05/01/21 1955 05/04/21 0245 05/05/21 0414 05/06/21 0305 05/07/21 0456 05/08/21 0014  WBC 12.1*   < > 13.7* 10.6* 9.7 10.9* 11.2*  NEUTROABS 8.5*  --   --   --   --   --  10.3*  HGB 16.7   < > 13.9 14.5 13.8 12.6* 12.4*  HCT 50.4   < > 42.2 43.5 42.5 38.6* 38.3*  MCV 88.3   < > 87.9 85.5 86.9 89.1 89.1  PLT 355   < > 278 272 267 235 252   < > = values in this interval not displayed.    Basic Metabolic Panel: Recent Labs  Lab 05/04/21 0245 05/04/21 1013 05/04/21 1646 05/05/21 0414 05/05/21 1750 05/06/21 1037 05/07/21 0456 05/08/21 0014  NA 139  --   --  141  --  143 143 142  K 3.8  --   --  3.9  --  4.2 4.5 4.5  CL 104  --   --  104  --  103 106 103  CO2 26  --   --  26  --  34* 30 30  GLUCOSE 210*  --   --  201*  --  241* 227* 273*  BUN 38*  --   --  40*  --  35* 37* 38*  CREATININE 1.41*  --   --  1.13  --  0.89 0.90 0.98  CALCIUM 7.9*  --   --  8.5*  --  9.1 8.8* 8.7*  MG 2.1   < > 1.9 2.0 1.7 1.7 2.1 1.9  PHOS  --    < > 3.5 3.1 3.7  --  4.1 4.6   < > = values in this interval not displayed.   GFR: Estimated Creatinine Clearance: 73.8 mL/min (by C-G formula based on SCr of 0.98 mg/dL). Recent Labs  Lab 05/01/21 1300 05/01/21 1310 05/01/21 1401  05/01/21 1639 05/02/21 0317 05/02/21 2038 05/03/21 0201 05/04/21 0245 05/05/21 0414 05/06/21 0305 05/07/21 0456 05/08/21 0014  PROCALCITON 0.29  --   --   --  0.41  --  0.23  --   --   --   --   --   WBC  --    < >  --   --  5.0  --  8.7   < > 10.6* 9.7 10.9* 11.2*  LATICACIDVEN  --   --  3.1* 2.1*  --  2.1*  --   --   --   --   --   --    < > = values in this interval not displayed.    Liver Function Tests: Recent Labs  Lab 05/01/21 1310 05/02/21 0317 05/02/21 0844 05/06/21 1037 05/08/21 0014  AST 184* 140* 95* 11* 13*  ALT 179* 141* 130* 33 26  ALKPHOS 94 82 78 87 59  BILITOT 1.1 0.6 1.0 0.7 0.6  PROT 7.7 5.6* 5.9* 5.8* 5.0*  ALBUMIN 3.9 2.8* 2.8* 2.7* 2.3*   No results for input(s): LIPASE, AMYLASE in the last 168 hours. No results for input(s): AMMONIA in the last 168 hours.  ABG    Component Value Date/Time   PHART 7.292 (L) 05/04/2021 0047   PCO2ART 59.1 (H) 05/04/2021 0047   PO2ART 153 (H) 05/04/2021 0047   HCO3 28.4 (H) 05/04/2021 0047   TCO2 30 05/04/2021 0047   O2SAT 99.0 05/04/2021 0047     Coagulation Profile: No results for input(s): INR, PROTIME in the last 168 hours.  Cardiac Enzymes: No results for input(s): CKTOTAL, CKMB, CKMBINDEX, TROPONINI in the last 168 hours.  HbA1C: HB A1C (BAYER DCA - WAIVED)  Date/Time Value Ref Range Status  09/12/2020 03:09 PM 6.8 <7.0 % Final    Comment:                                          Diabetic Adult            <7.0                                       Healthy Adult        4.3 - 5.7                                                           (DCCT/NGSP) American Diabetes Association's Summary of Glycemic Recommendations for Adults with Diabetes: Hemoglobin A1c <7.0%. More stringent glycemic goals (A1c <6.0%) may further reduce complications at the cost of increased risk of hypoglycemia.   12/15/2019 12:52 PM 6.6 <7.0 % Final    Comment:                                          Diabetic Adult             <7.0                                       Healthy Adult        4.3 - 5.7                                                           (  DCCT/NGSP) American Diabetes Association's Summary of Glycemic Recommendations for Adults with Diabetes: Hemoglobin A1c <7.0%. More stringent glycemic goals (A1c <6.0%) may further reduce complications at the cost of increased risk of hypoglycemia.    Hgb A1c MFr Bld  Date/Time Value Ref Range Status  05/02/2021 03:17 AM 7.5 (H) 4.8 - 5.6 % Final    Comment:    (NOTE) Pre diabetes:          5.7%-6.4%  Diabetes:              >6.4%  Glycemic control for   <7.0% adults with diabetes   12/22/2020 05:52 AM 7.9 (H) 4.8 - 5.6 % Final    Comment:    (NOTE) Pre diabetes:          5.7%-6.4%  Diabetes:              >6.4%  Glycemic control for   <7.0% adults with diabetes     CBG: Recent Labs  Lab 05/07/21 1116 05/07/21 1526 05/07/21 1939 05/07/21 2330 05/08/21 0339  GLUCAP 169* 182* 261* 250* 152*    Review of Systems:   Denies pain, denies SOB  Past Medical History:  He,  has a past medical history of Anxiety, Asthma, Chronic lower back pain, COPD (chronic obstructive pulmonary disease) ([place]), Coronary artery disease, Depression, Educated about COVID-19 virus infection (03/06/2020), GERD (gastroesophageal reflux disease), High cholesterol, Hypertension, NSTEMI (non-ST elevated myocardial infarction) ([place]) (05/2014), Sleep apnea, Stroke ([place]) (2017), TIA (transient ischemic attack), Type II diabetes mellitus ([place]), and Ulcerative colitis ([place]).   Surgical History:   Past Surgical History:  Procedure Laterality Date   APPENDECTOMY     BIOPSY  07/20/2020   Procedure: BIOPSY;  Surgeon: Harvel Quale, MD;  Location: [facility];  Service: Gastroenterology;;   CARDIAC CATHETERIZATION  [phone] X 3   CHOLECYSTECTOMY     COLONOSCOPY WITH PROPOFOL N/A 07/20/2020   Procedure: COLONOSCOPY WITH PROPOFOL;  Surgeon: Harvel Quale, MD;  Location: [facility];  Service: Gastroenterology;  Laterality: N/A;  1:15   CORONARY ANGIOPLASTY WITH STENT PLACEMENT  05/2014   "2"   ESOPHAGEAL DILATION N/A 07/20/2020   Procedure: ESOPHAGEAL DILATION;  Surgeon: Harvel Quale, MD;  Location: [facility];  Service: Gastroenterology;  Laterality: N/A;   ESOPHAGOGASTRODUODENOSCOPY (EGD) WITH PROPOFOL N/A 07/20/2020   Procedure: ESOPHAGOGASTRODUODENOSCOPY (EGD) WITH PROPOFOL;  Surgeon: Harvel Quale, MD;  Location: [facility];  Service: Gastroenterology;  Laterality: N/A;   LEFT HEART CATH AND CORONARY ANGIOGRAPHY N/A 05/25/2020   Procedure: LEFT HEART CATH AND CORONARY ANGIOGRAPHY;  Surgeon: Martinique, Peter M, MD;  Location: [facility];  Service: Cardiovascular;  Laterality: N/A;   POLYPECTOMY  07/20/2020   Procedure: POLYPECTOMY INTESTINAL;  Surgeon: Harvel Quale, MD;  Location: [facility];  Service: Gastroenterology;;   TUMOR EXCISION Right ~ 1999   "side of my upper head"     Social History:   reports that he has been smoking cigarettes. He started smoking about 55 years ago. He has a 72.00 pack-year smoking history. He has never used smokeless tobacco. He reports that he does not currently use alcohol. He reports that he does not use drugs.   Family History:  His family history includes CAD in his father; Cancer in his brother and brother; Dementia in his sister; Emphysema in his sister; Leukemia in his sister; Lung cancer in his brother; Stroke in his mother.   Allergies Allergies  Allergen Reactions  Gabapentin Anxiety    Unknown reaction   Metformin And Related Rash     Home Medications  Prior to Admission medications   Medication Sig Start Date End Date Taking? Authorizing Provider  albuterol (VENTOLIN HFA) 108 (90 Base) MCG/ACT inhaler INHALE 2 PUFFS EVERY 6 HOURS AS NEEEDED Patient taking differently: Inhale 2 puffs into the lungs every 6 (six) hours as needed  for wheezing or shortness of breath. 11/02/19  Yes Hawks, Christy A, FNP  ALPRAZolam (XANAX) 0.5 MG tablet Take 1 tablet (0.5 mg total) by mouth 2 (two) times daily as needed. for anxiety 01/17/21  Yes Hawks, Christy A, FNP  amLODipine (NORVASC) 5 MG tablet Take 1 tablet (5 mg total) by mouth daily. 02/04/21  Yes Hawks, Christy A, FNP  Ascorbic Acid (VITAMIN C) 1000 MG tablet Take 1,000 mg by mouth daily.   Yes [provider]  atorvastatin (LIPITOR) 40 MG tablet TAKE 1 TABLET BY MOUTH ONCE DAILY IN THE EVENING Patient taking differently: Take 40 mg by mouth every evening. 12/05/20  Yes Hawks, Christy A, FNP  cholecalciferol (VITAMIN D3) 25 MCG (1000 UNIT) tablet Take 1,000 Units by mouth daily.   Yes [provider]  doxycycline (VIBRAMYCIN) 100 MG capsule Take 1 capsule (100 mg total) by mouth 2 (two) times daily. One po bid x 7 days Patient taking differently: Take 100 mg by mouth 2 (two) times daily. For 7 days 04/27/21  Yes Milton Ferguson, MD  escitalopram (LEXAPRO) 20 MG tablet Take 1 tablet (20 mg total) by mouth daily. 02/12/21  Yes Hawks, Christy A, FNP  fluticasone (FLONASE) 50 MCG/ACT nasal spray Place 2 sprays into both nostrils daily. Patient taking differently: Place 2 sprays into both nostrils daily as needed for allergies or rhinitis. 11/15/20  Yes Gwenlyn Perking, FNP  guaiFENesin (REFENESEN 400 PO) Take 2 tablets by mouth daily.   Yes [provider]  ibuprofen (ADVIL) 200 MG tablet Take 800 mg by mouth every 6 (six) hours as needed for mild pain.   Yes [provider]  ipratropium-albuterol (DUONEB) 0.5-2.5 (3) MG/3ML SOLN USE 1 AMPULE IN NEBULIZER 4 TIMES DAILY Patient taking differently: Take 3 mLs by nebulization 4 (four) times daily as needed (wheezing/shortness of breath). 04/29/21  Yes Hawks, Christy A, FNP  isosorbide mononitrate (IMDUR) 30 MG 24 hr tablet Take 1 tablet (30 mg total) by mouth daily. 04/09/21  Yes Branch, Alphonse Guild, MD  losartan  (COZAAR) 100 MG tablet Take 1 tablet (100 mg total) by mouth daily. 02/05/21  Yes Hawks, Christy A, FNP  metoprolol tartrate (LOPRESSOR) 50 MG tablet Take 1 tablet (50 mg total) by mouth 2 (two) times daily. 03/04/21  Yes Hawks, Christy A, FNP  mirtazapine (REMERON) 15 MG tablet Take 1 tablet (15 mg total) by mouth at bedtime. 01/01/21  Yes Hawks, Christy A, FNP  nitroGLYCERIN (NITROSTAT) 0.4 MG SL tablet Place 0.4 mg under the tongue every 5 (five) minutes as needed for chest pain.   Yes [provider]  OXYGEN Inhale 3 L into the lungs at bedtime.   Yes [provider]  pantoprazole (PROTONIX) 40 MG tablet Take 1 tablet by mouth once daily Patient taking differently: 40 mg daily. 11/26/20  Yes Hawks, Christy A, FNP  predniSONE (DELTASONE) 10 MG tablet Take 2 tablets (20 mg total) by mouth daily. Patient taking differently: Take 20 mg by mouth daily. For 7 days 04/27/21  Yes Milton Ferguson, MD  Vibegron (GEMTESA) 75 MG TABS Take 75  mg by mouth daily. Patient taking differently: Take 75 mg by mouth at bedtime. 04/18/21  Yes Irine Seal, MD  aspirin EC 81 MG tablet Take 1 tablet (81 mg total) by mouth daily. Patient not taking: Reported on 05/03/2021 10/15/18   Herminio Commons, MD  Blood Glucose Monitoring Suppl (ACCU-CHEK GUIDE ME) w/Device KIT Check BS daily Dx E11.9 05/16/20   [name] A, FNP  furosemide (LASIX) 20 MG tablet Take 1 tablet (20 mg total) by mouth 2 (two) times daily. Patient not taking: Reported on 05/03/2021 01/01/21   [name] A, FNP  glucose blood (ONE TOUCH ULTRA TEST) test strip USE TO CHECK GLUCOSE ONCE DAILY 02/11/18   Terald Sleeper, PA-C  Lancets [facility] ULTRASOFT) lancets Use as instructed   DX E11.9 05/05/17   [name] A, FNP  nicotine (NICODERM CQ - DOSED IN MG/24 HOURS) 14 mg/24hr patch Place 1 patch (14 mg total) onto the skin daily. Patient not taking: Reported on 05/03/2021 06/14/20   [name], FNP     The patient is  critically ill with multiple organ systems failure and requires high complexity decision making for assessment and support, frequent evaluation and titration of therapies, application of advanced monitoring technologies and extensive interpretation of multiple databases. Critical Care Time devoted to patient care services described in this note independent of APP/resident time (if applicable)  is 35 minutes.   Sherrilyn Rist MD [place] [facility] Personal pager: See Amion If unanswered, please page CCM On-call: [phone]

## 2021-05-08 NOTE — Progress Notes (Addendum)
Patient observed attempting to climb out of bed by self. Patient had removed nasal cannula, O2 sensor and pulled out one PIV. Patient more confused than on previous assessments, diaphoretic, tachycardic with HR into 130's and tachypneic with RR in 40's. When O2 sensor and nasal cannula were replaced, patient's O2 saturation was 66% so NRB was placed. Patient was repositioned in bed and sat up with O2 saturation improving to 98%. MD and RT were notified and came to bedside to assess. Orders were executed as they were placed. Continuing to monitor patient.

## 2021-05-08 NOTE — Progress Notes (Signed)
ANTICOAGULATION CONSULT NOTE - Follow Up Consult  Pharmacy Consult for heparin Indication: atrial fibrillation  Labs: Recent Labs    05/06/21 0305 05/06/21 1037 05/06/21 2050 05/07/21 0456 05/07/21 1309 05/08/21 0014  HGB 13.8  --   --  12.6*  --  12.4*  HCT 42.5  --   --  38.6*  --  38.3*  PLT 267  --   --  235  --  252  HEPARINUNFRC 0.35  --    < > 0.26* 0.50 0.38  CREATININE  --  0.89  --  0.90  --  0.98   < > = values in this interval not displayed.    Assessment/Plan:  75yo male therapeutic on heparin after rate change. Will continue infusion at current rate of 1500 units/hr and monitor daily level.   Wynona Neat, PharmD, BCPS  05/08/2021,1:19 AM

## 2021-05-08 NOTE — Progress Notes (Signed)
Did not have a cuff leak when he was tested  He has a history of tracheal stenosis, may not have any significant airway edema at present Has not enough days to stabilize from prior extubation  I did counsel the patient that he may need reintubation if he fails and possible need for tracheostomy  We will give him a chance of extubation

## 2021-05-08 NOTE — Progress Notes (Signed)
RT called to room to place pt on BIPAP. Pt was on NRB mask upon arrival to room, very diaphoretic with increased WOB. Pt placed on BIPAP 14/6/30% and is tolerating well at this time. ABG obtained and results given to MD.

## 2021-05-08 NOTE — Progress Notes (Signed)
Pt placed on PSV 5/5 per wean protocol. Pt is tolerating well at this time. RN aware.

## 2021-05-08 NOTE — Procedures (Signed)
Extubation Procedure Note  Patient Details:   Name: Ronald [name] DOB: 05/01/1946 MRN: [id number]   Airway Documentation:    Vent end date: 05/08/21 Vent end time: 0915   Evaluation  O2 sats: stable throughout Complications: No apparent complications Patient did tolerate procedure well. Bilateral Breath Sounds: Diminished   Pt extubated to 5L [place] per MD order. Pt did not have cuff leak prior to extubation. MD was made aware. Pt able to voice his name and no stridor noted.  Vilinda Blanks 05/08/2021, 9:16 AM

## 2021-05-09 DIAGNOSIS — J9602 Acute respiratory failure with hypercapnia: Secondary | ICD-10-CM | POA: Diagnosis not present

## 2021-05-09 LAB — BASIC METABOLIC PANEL
Anion gap: 9 (ref 5–15)
BUN: 39 mg/dL — ABNORMAL HIGH (ref 8–23)
CO2: 34 mmol/L — ABNORMAL HIGH (ref 22–32)
Calcium: 8.8 mg/dL — ABNORMAL LOW (ref 8.9–10.3)
Chloride: 102 mmol/L (ref 98–111)
Creatinine, Ser: 1 mg/dL (ref 0.61–1.24)
GFR, Estimated: >60 mL/min (ref >60)
Glucose, Bld: 178 mg/dL — ABNORMAL HIGH (ref 70–99)
Potassium: 4.7 mmol/L (ref 3.5–5.1)
Sodium: 145 mmol/L (ref 135–145)

## 2021-05-09 LAB — GLUCOSE, CAPILLARY
Glucose-Capillary: 115 mg/dL — ABNORMAL HIGH (ref 70–99)
Glucose-Capillary: 142 mg/dL — ABNORMAL HIGH (ref 70–99)
Glucose-Capillary: 144 mg/dL — ABNORMAL HIGH (ref 70–99)
Glucose-Capillary: 149 mg/dL — ABNORMAL HIGH (ref 70–99)
Glucose-Capillary: 172 mg/dL — ABNORMAL HIGH (ref 70–99)
Glucose-Capillary: 85 mg/dL (ref 70–99)
Glucose-Capillary: 89 mg/dL (ref 70–99)

## 2021-05-09 LAB — HEPARIN LEVEL (UNFRACTIONATED): Heparin Unfractionated: 0.55 IU/mL (ref 0.30–0.70)

## 2021-05-09 LAB — CBC
HCT: 39.8 % (ref 39.0–52.0)
Hemoglobin: 12.7 g/dL — ABNORMAL LOW (ref 13.0–17.0)
MCH: 28.7 pg (ref 26.0–34.0)
MCHC: 31.9 g/dL (ref 30.0–36.0)
MCV: 90 fL (ref 80.0–100.0)
Platelets: 246 10*3/uL (ref 150–400)
RBC: 4.42 MIL/uL (ref 4.22–5.81)
RDW: 13.6 % (ref 11.5–15.5)
WBC: 12.1 10*3/uL — ABNORMAL HIGH (ref 4.0–10.5)
nRBC: 0 % (ref 0.0–0.2)

## 2021-05-09 LAB — MAGNESIUM: Magnesium: 2 mg/dL (ref 1.7–2.4)

## 2021-05-09 LAB — PHOSPHORUS: Phosphorus: 4.7 mg/dL — ABNORMAL HIGH (ref 2.5–4.6)

## 2021-05-09 MED ORDER — METOPROLOL TARTRATE 5 MG/5ML IV SOLN
2.5000 mg | Freq: Four times a day (QID) | INTRAVENOUS | Status: DC
  Administered 2021-05-09 – 2021-05-12 (×13): 2.5 mg via INTRAVENOUS
  Filled 2021-05-09 (×13): qty 5

## 2021-05-09 MED ORDER — DOXAZOSIN MESYLATE 1 MG PO TABS
1.0000 mg | ORAL_TABLET | Freq: Every day | ORAL | Status: AC
  Administered 2021-05-11: 1 mg
  Filled 2021-05-09 (×3): qty 1

## 2021-05-09 MED ORDER — AMIODARONE HCL 200 MG PO TABS
100.0000 mg | ORAL_TABLET | Freq: Every day | ORAL | Status: DC
  Administered 2021-05-14 – 2021-05-15 (×2): 100 mg
  Filled 2021-05-09 (×2): qty 1

## 2021-05-09 MED ORDER — ATORVASTATIN CALCIUM 40 MG PO TABS
40.0000 mg | ORAL_TABLET | Freq: Every evening | ORAL | Status: DC
  Administered 2021-05-10 – 2021-06-05 (×27): 40 mg
  Filled 2021-05-09 (×27): qty 1

## 2021-05-09 MED ORDER — ISOSORBIDE MONONITRATE 20 MG PO TABS
10.0000 mg | ORAL_TABLET | Freq: Two times a day (BID) | ORAL | Status: DC
  Administered 2021-05-10 – 2021-05-11 (×2): 10 mg via ORAL
  Filled 2021-05-09 (×6): qty 1

## 2021-05-09 MED ORDER — PANTOPRAZOLE 2 MG/ML SUSPENSION
40.0000 mg | Freq: Every day | ORAL | Status: DC
  Administered 2021-05-11 – 2021-05-16 (×6): 40 mg
  Filled 2021-05-09 (×8): qty 20

## 2021-05-09 MED ORDER — ESCITALOPRAM OXALATE 10 MG PO TABS
20.0000 mg | ORAL_TABLET | Freq: Every day | ORAL | Status: DC
  Administered 2021-05-11 – 2021-06-06 (×27): 20 mg
  Filled 2021-05-09 (×27): qty 2

## 2021-05-09 MED ORDER — AMIODARONE HCL 200 MG PO TABS
400.0000 mg | ORAL_TABLET | Freq: Two times a day (BID) | ORAL | Status: DC
  Administered 2021-05-10 – 2021-05-13 (×7): 400 mg
  Filled 2021-05-09 (×7): qty 2

## 2021-05-09 MED ORDER — ACETAMINOPHEN 325 MG PO TABS
650.0000 mg | ORAL_TABLET | ORAL | Status: DC | PRN
  Administered 2021-05-11 – 2021-06-05 (×22): 650 mg
  Filled 2021-05-09 (×23): qty 2

## 2021-05-09 MED ORDER — ISOSORBIDE MONONITRATE ER 30 MG PO TB24
15.0000 mg | ORAL_TABLET | Freq: Two times a day (BID) | ORAL | Status: DC
  Filled 2021-05-09: qty 1

## 2021-05-09 MED ORDER — MIRTAZAPINE 15 MG PO TABS
15.0000 mg | ORAL_TABLET | Freq: Every day | ORAL | Status: DC
  Administered 2021-05-10 – 2021-05-22 (×13): 15 mg
  Filled 2021-05-09 (×14): qty 1

## 2021-05-09 MED ORDER — SENNOSIDES 8.8 MG/5ML PO SYRP
5.0000 mL | ORAL_SOLUTION | Freq: Two times a day (BID) | ORAL | Status: DC | PRN
  Filled 2021-05-09: qty 5

## 2021-05-09 MED ORDER — DOCUSATE SODIUM 50 MG/5ML PO LIQD
100.0000 mg | Freq: Two times a day (BID) | ORAL | Status: DC
  Administered 2021-05-11 – 2021-05-15 (×3): 100 mg
  Filled 2021-05-09 (×8): qty 10

## 2021-05-09 MED ORDER — POLYETHYLENE GLYCOL 3350 17 G PO PACK
17.0000 g | PACK | Freq: Every day | ORAL | Status: DC
  Administered 2021-05-11 – 2021-05-15 (×2): 17 g
  Filled 2021-05-09 (×5): qty 1

## 2021-05-09 MED ORDER — NICOTINE 14 MG/24HR TD PT24
14.0000 mg | MEDICATED_PATCH | Freq: Every day | TRANSDERMAL | Status: DC
  Administered 2021-05-09 – 2021-06-06 (×28): 14 mg via TRANSDERMAL
  Filled 2021-05-09 (×29): qty 1

## 2021-05-09 MED ORDER — GUAIFENESIN 100 MG/5ML PO LIQD
5.0000 mL | Freq: Four times a day (QID) | ORAL | Status: DC
  Administered 2021-05-10 – 2021-06-06 (×106): 5 mL
  Filled 2021-05-09 (×105): qty 5

## 2021-05-09 MED ORDER — METOPROLOL TARTRATE 25 MG PO TABS: 50.0000 mg | ORAL_TABLET | Freq: Two times a day (BID) | ORAL | Status: DC

## 2021-05-09 MED ORDER — ASPIRIN 81 MG PO CHEW
81.0000 mg | CHEWABLE_TABLET | Freq: Every day | ORAL | Status: DC
  Administered 2021-05-11 – 2021-06-04 (×24): 81 mg
  Filled 2021-05-09 (×24): qty 1

## 2021-05-09 MED ORDER — FENTANYL CITRATE (PF) 100 MCG/2ML IJ SOLN
50.0000 ug | INTRAMUSCULAR | Status: DC | PRN
  Administered 2021-05-09 – 2021-05-16 (×8): 50 ug via INTRAVENOUS
  Filled 2021-05-09 (×7): qty 2

## 2021-05-09 MED ORDER — FENTANYL CITRATE (PF) 100 MCG/2ML IJ SOLN
INTRAMUSCULAR | Status: AC
  Filled 2021-05-09: qty 2

## 2021-05-09 NOTE — Progress Notes (Addendum)
ANTICOAGULATION CONSULT NOTE - Follow-up Consult  Pharmacy Consult for IV Heparin Indication:  Atrial Fibrillation  Allergies  Allergen Reactions   Gabapentin Anxiety    Unknown reaction   Metformin And Related Rash    Patient Measurements: Total Body Weight: 89 kg Height: 69 inches Heparin Dosing Weight: 89 kg  Vital Signs: Temp: 97.5 F (36.4 C) (12/01 0710) Temp Source: Axillary (12/01 0710) BP: 140/66 (12/01 0600) Pulse Rate: 88 (12/01 0746)  Labs: Recent Labs    05/07/21 0456 05/07/21 1309 05/08/21 0014 05/08/21 1451 05/08/21 2027 05/09/21 0236  HGB 12.6*  --  12.4* 13.9 12.2* 12.7*  HCT 38.6*  --  38.3* 41.0 36.0* 39.8  PLT 235  --  252  --   --  246  HEPARINUNFRC 0.26* 0.50 0.38  --   --  0.55  CREATININE 0.90  --  0.98  --   --  1.00     Estimated Creatinine Clearance: 72.4 mL/min (by C-G formula based on SCr of 1 mg/dL).   Medical History: Past Medical History:  Diagnosis Date   Anxiety    Asthma    Chronic lower back pain    COPD (chronic obstructive pulmonary disease) (HCC)    Coronary artery disease    a. NSTEMI 05/2014 s/p DESx2 to LAD at [facility].   Depression    Educated about COVID-19 virus infection 03/06/2020   GERD (gastroesophageal reflux disease)    High cholesterol    Hypertension    NSTEMI (non-ST elevated myocardial infarction) ([place]) 05/2014   with stent placement   Sleep apnea    Stroke ([place]) 2017   anyeusym    TIA (transient ischemic attack)    "they say I've had some mini strokes; don't know when"; denies residual on 06/22/2014)   Type II diabetes mellitus ([place])    Ulcerative colitis ([place])     Assessment: 75 yr old man presented to [facility] with acute respiratory failure, acute on chronic COPD, asthma, pneumonia, chronic dysphagia, circulatory shock, hx diastolic CHF, hx CAD, HLD, HTN, smoking hx, DM2, GERD, hx of anxiety. Pt now transferred to [facility]. Pt with atrial flutter with V rate 130. Pharmacy  is consulted to dose IV heparin for a fib. Pt was on no anticoagulant PTA. He rec'd a dose of Lovenox 40 mg for VTE prophylaxis at 1554 PM 11/23.  Heparin level 0.55 (on 1500 units/hr) No signs/symptoms of bleed currently. Of note, patient agitated yesterday and pulled out a PIV   Goal of Therapy:  Heparin level 0.3-0.7 units/ml Monitor platelets by anticoagulation protocol: Yes   Plan:  Continue heparin at 1500 units/hr Monitor daily heparin level, CBC Monitor for bleeding Follow-up long-term anticoagulation plans Ongoing discussion around trach if patients need re-intubation  Thank you for allowing pharmacy to be a part of this patient's care.  Donnald Garre, PharmD Clinical Pharmacist  Please check AMION for all [place] numbers After 10:00 PM, call [place] [phone]

## 2021-05-09 NOTE — Progress Notes (Signed)
SLP Cancellation Note  Patient Details Name: Ronald [name] MRN: [id number] DOB: Jun 27, 1945   Cancelled treatment:        Attempted to see pt for clinical swallowing evaluation.  Pt on BiPAP at time of attempt and not medically ready for PO trials.  RN with plans to place NG.  SLP will reattempt as schedule permits went pt is appropriate for POs.    Celedonio Savage, MA, [place] Office: [phone] 05/09/2021, 9:53 AM

## 2021-05-09 NOTE — Progress Notes (Signed)
Nutrition Follow-up  DOCUMENTATION CODES:   Not applicable  INTERVENTION:   If unable to advance PO diet within the next 24 hours, recommend Cortrak placement (next Cortrak service available Friday).  If NG or Cortrak placed, recommend begin TF with Osmolite 1.2 at 35 ml/h, increase by 10 ml every 4 hours to goal rate of 75 ml/h with Prosource TF 45 ml BID to provide 2240 kcal, 122 gm protein, 1476 ml free water daily.  NUTRITION DIAGNOSIS:   Inadequate oral intake related to inability to eat as evidenced by NPO status.  Ongoing  GOAL:   Patient will meet greater than or equal to 90% of their needs  MONITOR:   Vent status, TF tolerance, Labs  REASON FOR ASSESSMENT:   Consult Enteral/tube feeding initiation and management  ASSESSMENT:   75 yo male admitted from [facility] with acute respiratory failure, influenza A. PMH includes COPD, CAD, ulcerative colitis, HTN, asthma, DM-2, TIA, GERD, HLD.  Patient was extubated 11/30. SLP unable to complete swallow evaluation d/t patient being on BiPAP this morning. RN attempted NG tube placement, but was unsuccessful.  SLP to follow for ability to begin PO diet.   Labs reviewed. Phos 4.7 CBG: [phone]  Medications reviewed and include Colace, Novolog, Semglee, Solumedrol, Remeron, Protonix, Miralax, Precedex, IV Rocephin.  I/O +7.1 L since admission. Admission weight 85.4 kg Current weight 88.4 kg  NUTRITION - FOCUSED PHYSICAL EXAM:  Unable to complete  Diet Order:   Diet Order             Diet NPO time specified  Diet effective now                   EDUCATION NEEDS:   No education needs have been identified at this time  Skin:  Skin Assessment: Reviewed RN Assessment  Last BM:  11/30  Height:   Ht Readings from Last 1 Encounters:  04/27/21 5\' 9"  (1.753 m)    Weight:   Wt Readings from Last 1 Encounters:  05/09/21 88.4 kg    BMI:  Body mass index is 28.78 kg/m.  Estimated Nutritional Needs:    Kcal:  2200-2400  Protein:  120-130 gm  Fluid:  2.1-2.3 L    Lucas Mallow, RD, LDN, CNSC Please refer to Amion for contact information.

## 2021-05-09 NOTE — Progress Notes (Signed)
NAME:  Ronald [name], MRN:  [id number], DOB:  01/27/1946, LOS: 8 ADMISSION DATE:  05/01/2021, CONSULTATION DATE:  05/01/2021 REFERRING MD:  Dr. Pearline Cables, CHIEF COMPLAINT:  Respiratory Distress    History of Present Illness:  Ronald [name] is a 75 year old male with PMHx of COPD GOLD Stage 3-4, tracheal stenosis s/p trach in 2018, CAD s/p DESx2 to LAD in 2015, hypertension, hyperlipidemia, chronic respiratory failure on 2L, HFpEF, type II diabetes presenting with shortness of breath. He was given steroids and duonebs by EMS and placed on CPAP without significant improvement Patient intubated in the ED for airway protection, given steroids, broad spectrum antibiotics and started on levophed for hypotension and admitted to [facility].    Patient had similar presentation for SOB to ED on 11/19 and recommended for admission for COPD exacerbation but declined admission. He was given prednisone and doxycycline on discharge.   Pertinent  Medical History    Anxiety     Asthma     Chronic lower back pain     COPD (chronic obstructive pulmonary disease) (HCC)     Coronary artery disease      a. NSTEMI 05/2014 s/p DESx2 to LAD at [facility].   Depression     Educated about COVID-19 virus infection 03/06/2020   GERD (gastroesophageal reflux disease)     High cholesterol     Hypertension     NSTEMI (non-ST elevated myocardial infarction) ([place]) 05/2014    with stent placement   Sleep apnea     Stroke ([place]) 2017    anyeusym    TIA (transient ischemic attack)      "they say I've had some mini strokes; don't know when"; denies residual on 06/22/2014)   Type II diabetes mellitus ([place])     Ulcerative colitis ([place])    Significant Hospital Events: Including procedures, antibiotic start and stop dates in addition to other pertinent events   11/23 intubated resp distress and severe air trapped; transferred from [facility] to [facility] 11/24 Continued on full vent support for acute respiratory failure and amiodarone gtt and heparin  gtt for Afib with RVR  11/25 Extubated. Re-intubated that night due to tachypnea 11/30 attempt at weaning 12/1 extubated 11/30, difficulty tolerating BiPAP, significant agitation  Interim History / Subjective:   On Precedex More cooperative this morning On BiPAP  Objective   Blood pressure 140/66, pulse 88, temperature (!) 97.5 F (36.4 C), temperature source Axillary, resp. rate (!) 22, weight 88.4 kg, SpO2 97 %.    Vent Mode: BIPAP;PCV FiO2 (%):  [30 %-50 %] 50 % Set Rate:  [12 bmp] 12 bmp PEEP:  [5 cmH20-6 cmH20] 6 cmH20 Plateau Pressure:  [8 cmH20-13 cmH20] 8 cmH20   Intake/Output Summary (Last 24 hours) at 05/09/2021 0835 Last data filed at 05/09/2021 8185 Gross per 24 hour  Intake 803.87 ml  Output 3225 ml  Net -2421.13 ml   Filed Weights   05/05/21 0100 05/06/21 0500 05/09/21 0500  Weight: 89.9 kg 88.2 kg 88.4 kg    Examination: General: Elderly, arouses easily HENT: BiPAP mask in place Lungs: Coarse breath sounds at the bases Cardiovascular: S1-S2 appreciated, irregularly irregular Abdomen: Bowel sounds appreciated Extremities: No edema Neuro: Awakens, follows simple commands GU: I&O cath ( Urinary retention )  Resolved Hospital Problem list     Assessment & Plan:   Acute on chronic hypoxic and hypercapnic respiratory failure Chronic obstructive pulmonary disease History of tracheal stenosis -Continue BiPAP -Transition to oxygen supplementation as tolerated -Continue  bronchodilators -Cautious diuresis -Maintain on Precedex for agitation  Atrial flutter with RVR -Continue metoprolol -Continue amiodarone -Continue heparin -We will try to transition to oral medications if able to stay off BiPAP  History of diastolic heart failure Hypertension Echocardiogram with normal function -Cautious diuresis as tolerated  Shock liver -LFTs have normalized  Acute kidney injury -Avoid nephrotoxic medications -Maintain renal perfusion  Type 2  diabetes -SSI monitor -Continue Semglee  Urinary retention History of overactive bladder however noted to be requiring catheterization -Continue Urecholine-we will plan to start following extubation if able to extubate and tube if not able to  Most recent ABG noted 7.46/53/61  Best Practice (right click and "Reselect all SmartList Selections" daily)   Diet/type: tubefeeds DVT prophylaxis: systemic heparin GI prophylaxis: PPI Lines: N/A Foley:  Yes, and it is still needed Code Status:  full code Last date of multidisciplinary goals of care discussion [11/25]  Labs   CBC: Recent Labs  Lab 05/05/21 0414 05/06/21 0305 05/07/21 0456 05/08/21 0014 05/08/21 1451 05/08/21 2027 05/09/21 0236  WBC 10.6* 9.7 10.9* 11.2*  --   --  12.1*  NEUTROABS  --   --   --  10.3*  --   --   --   HGB 14.5 13.8 12.6* 12.4* 13.9 12.2* 12.7*  HCT 43.5 42.5 38.6* 38.3* 41.0 36.0* 39.8  MCV 85.5 86.9 89.1 89.1  --   --  90.0  PLT 272 267 235 252  --   --  401    Basic Metabolic Panel: Recent Labs  Lab 05/05/21 0414 05/05/21 1750 05/06/21 1037 05/07/21 0456 05/08/21 0014 05/08/21 1451 05/08/21 2027 05/09/21 0236  NA 141  --  143 143 142 145 144 145  K 3.9  --  4.2 4.5 4.5 4.9 4.3 4.7  CL 104  --  103 106 103  --   --  102  CO2 26  --  34* 30 30  --   --  34*  GLUCOSE 201*  --  241* 227* 273*  --   --  178*  BUN 40*  --  35* 37* 38*  --   --  39*  CREATININE 1.13  --  0.89 0.90 0.98  --   --  1.00  CALCIUM 8.5*  --  9.1 8.8* 8.7*  --   --  8.8*  MG 2.0 1.7 1.7 2.1 1.9  --   --  2.0  PHOS 3.1 3.7  --  4.1 4.6  --   --  4.7*   GFR: Estimated Creatinine Clearance: 72.4 mL/min (by C-G formula based on SCr of 1 mg/dL). Recent Labs  Lab 05/02/21 2038 05/03/21 0201 05/04/21 0245 05/06/21 0305 05/07/21 0456 05/08/21 0014 05/09/21 0236  PROCALCITON  --  0.23  --   --   --   --   --   WBC  --  8.7   < > 9.7 10.9* 11.2* 12.1*  LATICACIDVEN 2.1*  --   --   --   --   --   --    < > =  values in this interval not displayed.    Liver Function Tests: Recent Labs  Lab 05/02/21 0844 05/06/21 1037 05/08/21 0014  AST 95* 11* 13*  ALT 130* 33 26  ALKPHOS 78 87 59  BILITOT 1.0 0.7 0.6  PROT 5.9* 5.8* 5.0*  ALBUMIN 2.8* 2.7* 2.3*   No results for input(s): LIPASE, AMYLASE in the last 168 hours. Recent Labs  Lab 05/08/21  1808  AMMONIA 22    ABG    Component Value Date/Time   PHART 7.465 (H) 05/08/2021 2027   PCO2ART 53.4 (H) 05/08/2021 2027   PO2ART 61 (L) 05/08/2021 2027   HCO3 38.6 (H) 05/08/2021 2027   TCO2 40 (H) 05/08/2021 2027   O2SAT 92.0 05/08/2021 2027     Coagulation Profile: No results for input(s): INR, PROTIME in the last 168 hours.  Cardiac Enzymes: No results for input(s): CKTOTAL, CKMB, CKMBINDEX, TROPONINI in the last 168 hours.  HbA1C: HB A1C (BAYER DCA - WAIVED)  Date/Time Value Ref Range Status  09/12/2020 03:09 PM 6.8 <7.0 % Final    Comment:                                          Diabetic Adult            <7.0                                       Healthy Adult        4.3 - 5.7                                                           (DCCT/NGSP) American Diabetes Association's Summary of Glycemic Recommendations for Adults with Diabetes: Hemoglobin A1c <7.0%. More stringent glycemic goals (A1c <6.0%) may further reduce complications at the cost of increased risk of hypoglycemia.   12/15/2019 12:52 PM 6.6 <7.0 % Final    Comment:                                          Diabetic Adult            <7.0                                       Healthy Adult        4.3 - 5.7                                                           (DCCT/NGSP) American Diabetes Association's Summary of Glycemic Recommendations for Adults with Diabetes: Hemoglobin A1c <7.0%. More stringent glycemic goals (A1c <6.0%) may further reduce complications at the cost of increased risk of hypoglycemia.    Hgb A1c MFr Bld  Date/Time Value Ref Range  Status  05/02/2021 03:17 AM 7.5 (H) 4.8 - 5.6 % Final    Comment:    (NOTE) Pre diabetes:          5.7%-6.4%  Diabetes:              >6.4%  Glycemic control for   <7.0% adults with diabetes   12/22/2020 05:52 AM 7.9 (H) 4.8 - 5.6 %  Final    Comment:    (NOTE) Pre diabetes:          5.7%-6.4%  Diabetes:              >6.4%  Glycemic control for   <7.0% adults with diabetes     CBG: Recent Labs  Lab 05/08/21 1742 05/08/21 1923 05/08/21 2358 05/09/21 0359 05/09/21 0707  GLUCAP 200* 165* 142* 172* 115*    Review of Systems:   Denies pain, denies SOB  Past Medical History:  He,  has a past medical history of Anxiety, Asthma, Chronic lower back pain, COPD (chronic obstructive pulmonary disease) ([place]), Coronary artery disease, Depression, Educated about COVID-19 virus infection (03/06/2020), GERD (gastroesophageal reflux disease), High cholesterol, Hypertension, NSTEMI (non-ST elevated myocardial infarction) ([place]) (05/2014), Sleep apnea, Stroke ([place]) (2017), TIA (transient ischemic attack), Type II diabetes mellitus ([place]), and Ulcerative colitis ([place]).   Surgical History:   Past Surgical History:  Procedure Laterality Date   APPENDECTOMY     BIOPSY  07/20/2020   Procedure: BIOPSY;  Surgeon: Harvel Quale, MD;  [facility];  Service: Gastroenterology;;   CARDIAC CATHETERIZATION  [phone] X 3   CHOLECYSTECTOMY     COLONOSCOPY WITH PROPOFOL N/A 07/20/2020   Procedure: COLONOSCOPY WITH PROPOFOL;  Surgeon: Harvel Quale, MD;  [facility];  Service: Gastroenterology;  Laterality: N/A;  1:15   CORONARY ANGIOPLASTY WITH STENT PLACEMENT  05/2014   "2"   ESOPHAGEAL DILATION N/A 07/20/2020   Procedure: ESOPHAGEAL DILATION;  Surgeon: Harvel Quale, MD;  [facility];  Service: Gastroenterology;  Laterality: N/A;   ESOPHAGOGASTRODUODENOSCOPY (EGD) WITH PROPOFOL N/A 07/20/2020   Procedure: ESOPHAGOGASTRODUODENOSCOPY  (EGD) WITH PROPOFOL;  Surgeon: Harvel Quale, MD;  [facility];  Service: Gastroenterology;  Laterality: N/A;   LEFT HEART CATH AND CORONARY ANGIOGRAPHY N/A 05/25/2020   Procedure: LEFT HEART CATH AND CORONARY ANGIOGRAPHY;  Surgeon: Martinique, Peter M, MD;  Location: [place] [facility];  Service: Cardiovascular;  Laterality: N/A;   POLYPECTOMY  07/20/2020   Procedure: POLYPECTOMY INTESTINAL;  Surgeon: Harvel Quale, MD;  [facility];  Service: Gastroenterology;;   TUMOR EXCISION Right ~ 1999   "side of my upper head"     Social History:   reports that he has been smoking cigarettes. He started smoking about 55 years ago. He has a 72.00 pack-year smoking history. He has never used smokeless tobacco. He reports that he does not currently use alcohol. He reports that he does not use drugs.   Family History:  His family history includes CAD in his father; Cancer in his brother and brother; Dementia in his sister; Emphysema in his sister; Leukemia in his sister; Lung cancer in his brother; Stroke in his mother.   Allergies Allergies  Allergen Reactions   Gabapentin Anxiety    Unknown reaction   Metformin And Related Rash     Home Medications  Prior to Admission medications   Medication Sig Start Date End Date Taking? Authorizing Provider  albuterol (VENTOLIN HFA) 108 (90 Base) MCG/ACT inhaler INHALE 2 PUFFS EVERY 6 HOURS AS NEEEDED Patient taking differently: Inhale 2 puffs into the lungs every 6 (six) hours as needed for wheezing or shortness of breath. 11/02/19  Yes Hawks, Christy A, FNP  ALPRAZolam (XANAX) 0.5 MG tablet Take 1 tablet (0.5 mg total) by mouth 2 (two) times daily as needed. for anxiety 01/17/21  Yes Hawks, Alyse Low A, FNP  amLODipine (NORVASC) 5  MG tablet Take 1 tablet (5 mg total) by mouth daily. 02/04/21  Yes Hawks, Christy A, FNP  Ascorbic Acid (VITAMIN C) 1000 MG tablet Take 1,000 mg by mouth daily.   Yes [provider]   atorvastatin (LIPITOR) 40 MG tablet TAKE 1 TABLET BY MOUTH ONCE DAILY IN THE EVENING Patient taking differently: Take 40 mg by mouth every evening. 12/05/20  Yes Hawks, Christy A, FNP  cholecalciferol (VITAMIN D3) 25 MCG (1000 UNIT) tablet Take 1,000 Units by mouth daily.   Yes [provider]  doxycycline (VIBRAMYCIN) 100 MG capsule Take 1 capsule (100 mg total) by mouth 2 (two) times daily. One po bid x 7 days Patient taking differently: Take 100 mg by mouth 2 (two) times daily. For 7 days 04/27/21  Yes Milton Ferguson, MD  escitalopram (LEXAPRO) 20 MG tablet Take 1 tablet (20 mg total) by mouth daily. 02/12/21  Yes Hawks, Christy A, FNP  fluticasone (FLONASE) 50 MCG/ACT nasal spray Place 2 sprays into both nostrils daily. Patient taking differently: Place 2 sprays into both nostrils daily as needed for allergies or rhinitis. 11/15/20  Yes Gwenlyn Perking, FNP  guaiFENesin (REFENESEN 400 PO) Take 2 tablets by mouth daily.   Yes [provider]  ibuprofen (ADVIL) 200 MG tablet Take 800 mg by mouth every 6 (six) hours as needed for mild pain.   Yes [provider]  ipratropium-albuterol (DUONEB) 0.5-2.5 (3) MG/3ML SOLN USE 1 AMPULE IN NEBULIZER 4 TIMES DAILY Patient taking differently: Take 3 mLs by nebulization 4 (four) times daily as needed (wheezing/shortness of breath). 04/29/21  Yes Hawks, Christy A, FNP  isosorbide mononitrate (IMDUR) 30 MG 24 hr tablet Take 1 tablet (30 mg total) by mouth daily. 04/09/21  Yes Branch, Alphonse Guild, MD  losartan (COZAAR) 100 MG tablet Take 1 tablet (100 mg total) by mouth daily. 02/05/21  Yes Hawks, Christy A, FNP  metoprolol tartrate (LOPRESSOR) 50 MG tablet Take 1 tablet (50 mg total) by mouth 2 (two) times daily. 03/04/21  Yes Hawks, Christy A, FNP  mirtazapine (REMERON) 15 MG tablet Take 1 tablet (15 mg total) by mouth at bedtime. 01/01/21  Yes Hawks, Christy A, FNP  nitroGLYCERIN (NITROSTAT) 0.4 MG SL tablet Place 0.4 mg under the tongue  every 5 (five) minutes as needed for chest pain.   Yes [provider]  OXYGEN Inhale 3 L into the lungs at bedtime.   Yes [provider]  pantoprazole (PROTONIX) 40 MG tablet Take 1 tablet by mouth once daily Patient taking differently: 40 mg daily. 11/26/20  Yes Hawks, Christy A, FNP  predniSONE (DELTASONE) 10 MG tablet Take 2 tablets (20 mg total) by mouth daily. Patient taking differently: Take 20 mg by mouth daily. For 7 days 04/27/21  Yes Milton Ferguson, MD  Vibegron (GEMTESA) 75 MG TABS Take 75 mg by mouth daily. Patient taking differently: Take 75 mg by mouth at bedtime. 04/18/21  Yes Irine Seal, MD  aspirin EC 81 MG tablet Take 1 tablet (81 mg total) by mouth daily. Patient not taking: Reported on 05/03/2021 10/15/18   Herminio Commons, MD  Blood Glucose Monitoring Suppl (ACCU-CHEK GUIDE ME) w/Device KIT Check BS daily Dx E11.9 05/16/20   [name] A, FNP  furosemide (LASIX) 20 MG tablet Take 1 tablet (20 mg total) by mouth 2 (two) times daily. Patient not taking: Reported on 05/03/2021 01/01/21   [name] A, FNP  glucose blood (ONE TOUCH ULTRA TEST) test strip USE TO CHECK  GLUCOSE ONCE DAILY 02/11/18   Terald Sleeper, PA-C  Lancets [facility] ULTRASOFT) lancets Use as instructed   DX E11.9 05/05/17   [name] A, FNP  nicotine (NICODERM CQ - DOSED IN MG/24 HOURS) 14 mg/24hr patch Place 1 patch (14 mg total) onto the skin daily. Patient not taking: Reported on 05/03/2021 06/14/20   [name], FNP    The patient is critically ill with multiple organ systems failure and requires high complexity decision making for assessment and support, frequent evaluation and titration of therapies, application of advanced monitoring technologies and extensive interpretation of multiple databases. Critical Care Time devoted to patient care services described in this note independent of APP/resident time (if applicable)  is 32 minutes.   Sherrilyn Rist MD [place]  Pulmonary Critical Care Personal pager: See Amion If unanswered, please page CCM On-call: [phone]

## 2021-05-09 NOTE — Progress Notes (Signed)
Pt w/ slight tachypnea, slight shift in LOC. C/o mild CP. Provider updated, orders recvd. Placed pt back on BIPAP along w/ PRN for pain. Pt appearing more relaxed, comfortable WOB w/I 15 minutes.

## 2021-05-09 NOTE — Progress Notes (Addendum)
RN x 2 attempted NGT insert. Every attempt tube unable to advance into esophagus and advanced bronchial. Pt tolerated well w/minimal trauma(scant bloody mucous, apparently nasopharangeal). While tube in bronchus, suction applied and NTS'd resulting in extraction of thick, beige sx. No blood tinge sx from bronchus. Pt o/ 5L [place] and comfortable appearing in wake of attempts.

## 2021-05-10 ENCOUNTER — Inpatient Hospital Stay (HOSPITAL_COMMUNITY): Payer: Medicare HMO

## 2021-05-10 ENCOUNTER — Ambulatory Visit: Payer: Medicare HMO | Admitting: Family

## 2021-05-10 DIAGNOSIS — J9602 Acute respiratory failure with hypercapnia: Secondary | ICD-10-CM | POA: Diagnosis not present

## 2021-05-10 LAB — CBC
HCT: 40.2 % (ref 39.0–52.0)
Hemoglobin: 12.4 g/dL — ABNORMAL LOW (ref 13.0–17.0)
MCH: 28 pg (ref 26.0–34.0)
MCHC: 30.8 g/dL (ref 30.0–36.0)
MCV: 90.7 fL (ref 80.0–100.0)
Platelets: 274 K/uL (ref 150–400)
RBC: 4.43 MIL/uL (ref 4.22–5.81)
RDW: 13.8 % (ref 11.5–15.5)
WBC: 9.7 K/uL (ref 4.0–10.5)
nRBC: 0 % (ref 0.0–0.2)

## 2021-05-10 LAB — GLUCOSE, CAPILLARY
Glucose-Capillary: 84 mg/dL (ref 70–99)
Glucose-Capillary: 96 mg/dL (ref 70–99)
Glucose-Capillary: 102 mg/dL — ABNORMAL HIGH (ref 70–99)
Glucose-Capillary: 132 mg/dL — ABNORMAL HIGH (ref 70–99)
Glucose-Capillary: 92 mg/dL (ref 70–99)

## 2021-05-10 LAB — BASIC METABOLIC PANEL
Anion gap: 7 (ref 5–15)
BUN: 40 mg/dL — ABNORMAL HIGH (ref 8–23)
CO2: 38 mmol/L — ABNORMAL HIGH (ref 22–32)
Calcium: 8.8 mg/dL — ABNORMAL LOW (ref 8.9–10.3)
Chloride: 101 mmol/L (ref 98–111)
Creatinine, Ser: 1.03 mg/dL (ref 0.61–1.24)
GFR, Estimated: >60 mL/min (ref >60)
Glucose, Bld: 96 mg/dL (ref 70–99)
Potassium: 4.3 mmol/L (ref 3.5–5.1)
Sodium: 146 mmol/L — ABNORMAL HIGH (ref 135–145)

## 2021-05-10 LAB — COMPREHENSIVE METABOLIC PANEL WITH GFR
ALT: 27 U/L (ref 0–44)
AST: 13 U/L — ABNORMAL LOW (ref 15–41)
Albumin: 2.3 g/dL — ABNORMAL LOW (ref 3.5–5.0)
Alkaline Phosphatase: 53 U/L (ref 38–126)
Anion gap: 7 (ref 5–15)
BUN: 39 mg/dL — ABNORMAL HIGH (ref 8–23)
CO2: 31 mmol/L (ref 22–32)
Calcium: 8.6 mg/dL — ABNORMAL LOW (ref 8.9–10.3)
Chloride: 108 mmol/L (ref 98–111)
Creatinine, Ser: 0.92 mg/dL (ref 0.61–1.24)
GFR, Estimated: >60 mL/min
Glucose, Bld: 100 mg/dL — ABNORMAL HIGH (ref 70–99)
Potassium: 4.4 mmol/L (ref 3.5–5.1)
Sodium: 146 mmol/L — ABNORMAL HIGH (ref 135–145)
Total Bilirubin: 0.9 mg/dL (ref 0.3–1.2)
Total Protein: 5.2 g/dL — ABNORMAL LOW (ref 6.5–8.1)

## 2021-05-10 LAB — BLOOD GAS, VENOUS
Acid-Base Excess: 10.5 mmol/L — ABNORMAL HIGH (ref 0.0–2.0)
Bicarbonate: 35.9 mmol/L — ABNORMAL HIGH (ref 20.0–28.0)
FIO2: 40
O2 Saturation: 40 %
Patient temperature: 36.1
pCO2, Ven: 59.1 mmHg (ref 44.0–60.0)
pH, Ven: 7.395 (ref 7.250–7.430)
pO2, Ven: <31 mmHg — CL (ref 32.0–45.0)

## 2021-05-10 LAB — HEPARIN LEVEL (UNFRACTIONATED): Heparin Unfractionated: 0.64 IU/mL (ref 0.30–0.70)

## 2021-05-10 MED ORDER — OSMOLITE 1.2 CAL PO LIQD
1000.0000 mL | ORAL | Status: DC
  Filled 2021-05-10 (×2): qty 1000

## 2021-05-10 MED ORDER — FREE WATER
100.0000 mL | Status: DC
Start: 2021-05-10 — End: 2021-05-16
  Administered 2021-05-10 – 2021-05-16 (×34): 100 mL

## 2021-05-10 MED ORDER — DIATRIZOATE MEGLUMINE & SODIUM 66-10 % PO SOLN
30.0000 mL | Freq: Once | ORAL | Status: AC
  Administered 2021-05-10: 20 mL
  Filled 2021-05-10: qty 30

## 2021-05-10 MED ORDER — OSMOLITE 1.2 CAL PO LIQD
1000.0000 mL | ORAL | Status: DC
  Administered 2021-05-10 – 2021-05-13 (×4): 1000 mL
  Filled 2021-05-10 (×9): qty 1000

## 2021-05-10 MED ORDER — FUROSEMIDE 10 MG/ML IJ SOLN
40.0000 mg | Freq: Once | INTRAMUSCULAR | Status: AC
  Administered 2021-05-10: 40 mg via INTRAVENOUS
  Filled 2021-05-10: qty 4

## 2021-05-10 MED ORDER — PROSOURCE TF PO LIQD
45.0000 mL | Freq: Two times a day (BID) | ORAL | Status: DC
Start: 2021-05-10 — End: 2021-05-14
  Administered 2021-05-10 – 2021-05-14 (×9): 45 mL
  Filled 2021-05-10 (×9): qty 45

## 2021-05-10 MED ORDER — ARFORMOTEROL TARTRATE 15 MCG/2ML IN NEBU
15.0000 ug | INHALATION_SOLUTION | Freq: Two times a day (BID) | RESPIRATORY_TRACT | Status: DC
  Administered 2021-05-10 – 2021-06-06 (×49): 15 ug via RESPIRATORY_TRACT
  Filled 2021-05-10 (×55): qty 2

## 2021-05-10 MED ORDER — QUETIAPINE FUMARATE 25 MG PO TABS
25.0000 mg | ORAL_TABLET | Freq: Every day | ORAL | Status: DC
  Administered 2021-05-10: 25 mg via ORAL
  Filled 2021-05-10: qty 1

## 2021-05-10 MED ORDER — DIATRIZOATE MEGLUMINE & SODIUM 66-10 % PO SOLN
ORAL | Status: AC
  Filled 2021-05-10: qty 30

## 2021-05-10 NOTE — Procedures (Signed)
Cortrak  Person Inserting Tube:  Esaw Dace, RD Tube Type:  Cortrak - 43 inches Tube Size:  10 Tube Location:  Left nare Initial Placement:  Postpyloric Secured by: Bridle Technique Used to Measure Tube Placement:  Marking at nare/corner of mouth Cortrak Secured At:  96 cm  Cortrak Tube Team Note:  Consult received to place a Cortrak feeding tube.   X-ray is required, abdominal x-ray has been ordered by the Cortrak team. Please confirm tube placement before using the Cortrak tube.   If the tube becomes dislodged please keep the tube and contact the Cortrak team at [email or web address] (password [email or web address]) for replacement.  If after hours and replacement cannot be delayed, place a NG tube and confirm placement with an abdominal x-ray.   Kerman Passey MS, RDN, LDN, CNSC Registered Dietitian III Clinical Nutrition RD Pager and On-Call Pager Number Located in [place]

## 2021-05-10 NOTE — Progress Notes (Signed)
SLP Cancellation Note  Patient Details Name: RHYATT MUSKA MRN: [id number] DOB: [birth year]   Cancelled treatment:       Reason Eval/Treat Not Completed: Patient not medically ready. Pt on Bipap   Jakaiya Netherland, Katherene Ponto 05/10/2021, 10:16 AM

## 2021-05-10 NOTE — Progress Notes (Signed)
NAME:  Ronald [name], MRN:  [id number], DOB:  February 26, 1946, LOS: 9 ADMISSION DATE:  05/01/2021, CONSULTATION DATE:  05/01/2021 REFERRING MD:  Dr. Pearline Cables, CHIEF COMPLAINT:  Respiratory Distress    History of Present Illness:  Ronald [name] is a 75 year old male with PMHx of COPD GOLD Stage 3-4, tracheal stenosis s/p trach in 2018, CAD s/p DESx2 to LAD in 2015, hypertension, hyperlipidemia, chronic respiratory failure on 2L, HFpEF, type II diabetes presenting with shortness of breath. He was given steroids and duonebs by EMS and placed on CPAP without significant improvement Patient intubated in the ED for airway protection, given steroids, broad spectrum antibiotics and started on levophed for hypotension and admitted to [facility].    Patient had similar presentation for SOB to ED on 11/19 and recommended for admission for COPD exacerbation but declined admission. He was given prednisone and doxycycline on discharge.   Pertinent  Medical History    Anxiety     Asthma     Chronic lower back pain     COPD (chronic obstructive pulmonary disease) (HCC)     Coronary artery disease      a. NSTEMI 05/2014 s/p DESx2 to LAD at [facility].   Depression     Educated about COVID-19 virus infection 03/06/2020   GERD (gastroesophageal reflux disease)     High cholesterol     Hypertension     NSTEMI (non-ST elevated myocardial infarction) ([place]) 05/2014    with stent placement   Sleep apnea     Stroke ([place]) 2017    anyeusym    TIA (transient ischemic attack)      "they say I've had some mini strokes; don't know when"; denies residual on 06/22/2014)   Type II diabetes mellitus ([place])     Ulcerative colitis ([place])    Significant Hospital Events: Including procedures, antibiotic start and stop dates in addition to other pertinent events   11/23 intubated resp distress and severe air trapped; transferred from [facility] to [facility] 11/24 Continued on full vent support for acute respiratory failure and amiodarone gtt and heparin  gtt for Afib with RVR  11/25 Extubated. Re-intubated that night due to tachypnea 11/30 attempt at weaning 12/1 extubated 11/30, difficulty tolerating BiPAP, significant agitation  Interim History / Subjective:  Only briefly on [place] yesterday morning since extubation on 11/30 This morning on BiPAP, drowsy but follow commands  Objective   Blood pressure (!) 146/88, pulse 81, temperature 97.7 F (36.5 C), temperature source Axillary, resp. rate (!) 24, weight 88.3 kg, SpO2 97 %.    Vent Mode: PSV;BIPAP FiO2 (%):  [36 %-60 %] 40 % Set Rate:  [12 bmp] 12 bmp PEEP:  [6 cmH20] 6 cmH20 Plateau Pressure:  [12 cmH20] 12 cmH20   Intake/Output Summary (Last 24 hours) at 05/10/2021 0811 Last data filed at 05/10/2021 0700 Gross per 24 hour  Intake 755.81 ml  Output 765 ml  Net -9.19 ml   Filed Weights   05/06/21 0500 05/09/21 0500 05/10/21 0500  Weight: 88.2 kg 88.4 kg 88.3 kg   Physical Exam: General: Chronically and critically ill-appearing, drowsy HENT: [place], AT, BiPAP in place Eyes: EOMI, no scleral icterus Respiratory: Distant breath sounds bilaterally.  No crackles, wheezing or rales Cardiovascular: Mild tachycardia, RR, -M/R/G, no JVD GI: BS+, soft, nontender Extremities: 3+ pitting edema in upper extremities,-tenderness Neuro: Drowsy but will awaken to touch and follow commands, CNII-XII grossly intact GU: Foley in place  Resolved Hospital Problem list   Shock liver  AKI  Assessment & Plan:   Acute metabolic encephalopathy Agitation requiring sedation -Obtain ABG -PAD protocol for RASS goal -1 -Start seroquel nightly  Acute on chronic hypoxic and hypercapnic respiratory failure Chronic obstructive pulmonary disease History of tracheal stenosis CFB+7 -Continue BiPAP -Transition to oxygen supplementation as tolerated -Continue to monitor for high respiratory risk for intubation -Diurese lasix 40 mg. Reassess this afternoon -Continue bronchodilators: Cont Pulmicort,  Duonebs. Add brovana -Wean steroids. Starting 60 mg daily 12/2 -Order CXR  Atrial flutter with RVR - rate-controlled -Telemetry -Continue metoprolol -Continue amiodarone -Continue heparin -We will try to transition to oral medications if able to stay off BiPAP  History of diastolic heart failure Hypertension Echocardiogram with normal function -Cautious diuresis as tolerated  Type 2 diabetes -SSI monitor -Continue Semglee  Urinary retention History of overactive bladder however noted to be requiring catheterization -Foley in place   Best Practice (right click and "Reselect all SmartList Selections" daily)   Diet/type: NPO. Plan for cortrak DVT prophylaxis: systemic heparin GI prophylaxis: PPI Lines: N/A Foley:  Yes, and it is still needed Code Status:  full code Last date of multidisciplinary goals of care discussion [11/25]   The patient is critically ill with multiple organ systems failure and requires high complexity decision making for assessment and support, frequent evaluation and titration of therapies, application of advanced monitoring technologies and extensive interpretation of multiple databases.  Independent Critical Care Time: 50 Minutes.   Rodman Pickle, M.D. [facility] Pulmonary/Critical Care Medicine 05/10/2021 8:11 AM   Please see Amion for pager number to reach on-call Pulmonary and Critical Care Team.

## 2021-05-10 NOTE — Progress Notes (Signed)
ANTICOAGULATION CONSULT NOTE - Follow-up Consult  Pharmacy Consult for IV Heparin Indication:  Atrial Fibrillation  Allergies  Allergen Reactions   Gabapentin Anxiety    Unknown reaction   Metformin And Related Rash    Patient Measurements: Total Body Weight: 89 kg Height: 69 inches Heparin Dosing Weight: 89 kg  Vital Signs: Temp: 97.7 F (36.5 C) (12/02 0751) Temp Source: Axillary (12/02 0751) BP: 146/76 (12/02 0800) Pulse Rate: 97 (12/02 0800)  Labs: Recent Labs    05/08/21 0014 05/08/21 1451 05/08/21 2027 05/09/21 0236 05/10/21 0331  HGB 12.4* 13.9 12.2* 12.7*  --   HCT 38.3* 41.0 36.0* 39.8  --   PLT 252  --   --  246  --   HEPARINUNFRC 0.38  --   --  0.55 0.64  CREATININE 0.98  --   --  1.00  --      Estimated Creatinine Clearance: 72.3 mL/min (by C-G formula based on SCr of 1 mg/dL).   Medical History: Past Medical History:  Diagnosis Date   Anxiety    Asthma    Chronic lower back pain    COPD (chronic obstructive pulmonary disease) (HCC)    Coronary artery disease    a. NSTEMI 05/2014 s/p DESx2 to LAD at [facility].   Depression    Educated about COVID-19 virus infection 03/06/2020   GERD (gastroesophageal reflux disease)    High cholesterol    Hypertension    NSTEMI (non-ST elevated myocardial infarction) ([name]) 05/2014   with stent placement   Sleep apnea    Stroke ([place]) 2017   anyeusym    TIA (transient ischemic attack)    "they say I've had some mini strokes; don't know when"; denies residual on 06/22/2014)   Type II diabetes mellitus ([place])    Ulcerative colitis ([place])     Assessment: 75 yr old man presented to [facility] with acute respiratory failure, acute on chronic COPD, asthma, pneumonia, chronic dysphagia, circulatory shock, hx diastolic CHF, hx CAD, HLD, HTN, smoking hx, DM2, GERD, hx of anxiety. Pt now transferred to [facility]. Pt with atrial flutter with V rate 130. Pharmacy is consulted to dose IV heparin for a  fib. Pt was on no anticoagulant PTA. He rec'd a dose of Lovenox 40 mg for VTE prophylaxis at 1554 PM 11/23.  Heparin level 0.64 (on 1500 units/hr) - trending up No signs/symptoms of bleed currently.    Goal of Therapy:  Heparin level 0.3-0.7 units/ml Monitor platelets by anticoagulation protocol: Yes   Plan:  Decrease heparin at 1450 units/hr Monitor daily heparin level, CBC Monitor for bleeding Follow-up long-term anticoagulation plans Ongoing discussion around trach if patients need re-intubation  Thank you for allowing pharmacy to be a part of this patient's care.  Alanda Slim, PharmD, [facility] Clinical Pharmacist Please see AMION for all Pharmacists' Contact Phone Numbers 05/10/2021, 9:29 AM

## 2021-05-10 NOTE — Progress Notes (Addendum)
Nutrition Follow-up  DOCUMENTATION CODES:   Non-severe (moderate) malnutrition in context of acute illness/injury  INTERVENTION:   Begin TF via Cortrak: Osmolite 1.2 at 20 ml/h, increase by 10 ml every 4 hours to goal rate of 75 ml/h (1800 ml per day) Prosource TF 45 ml BID  Provides 2240 kcal, 122 gm protein, 1476 ml free water daily.  Free water flushes 100 ml q 4 hours for total 2076 ml free water daily.  MVI with minerals daily via tube.  NUTRITION DIAGNOSIS:   Moderate Malnutrition related to acute illness (influenza A) as evidenced by mild muscle depletion, mild fat depletion.  Ongoing  GOAL:   Patient will meet greater than or equal to 90% of their needs  MONITOR:   TF tolerance, Labs, Diet advancement  REASON FOR ASSESSMENT:   Consult Enteral/tube feeding initiation and management  ASSESSMENT:   75 yo male admitted from [facility] with acute respiratory failure, influenza A. PMH includes COPD, CAD, ulcerative colitis, HTN, asthma, DM-2, TIA, GERD, HLD.  Patient was extubated 11/30. Patient remains on BiPAP this morning. SLP unable to complete swallow evaluation this morning d/t BiPAP. Cortrak was placed today; X-ray with contrast ordered and revealed tip is at the LOT. Okay to begin TF today and advance to goal rate per discussion with CCM.  Labs reviewed. Na 146 CBG: 84-96-102  Medications reviewed and include Colace, Novolog, Semglee, Solumedrol, Remeron, Protonix, Miralax, Precedex.  I/O +5.7 L since admission. Admission weight 85.4 kg Current weight 88.3 kg  Patient meets criteria for moderate malnutrition, given mild depletion of muscle and subcutaneous fat mass.   NUTRITION - FOCUSED PHYSICAL EXAM:  Flowsheet Row Most Recent Value  Orbital Region Mild depletion  Upper Arm Region No depletion  Thoracic and Lumbar Region No depletion  Buccal Region Mild depletion  Temple Region Mild depletion  Clavicle Bone Region Moderate depletion  Clavicle and  Acromion Bone Region Mild depletion  Scapular Bone Region Unable to assess  Dorsal Hand No depletion  Patellar Region Mild depletion  Anterior Thigh Region Mild depletion  Posterior Calf Region Moderate depletion  Edema (RD Assessment) Severe  [BUE]  Hair Reviewed  Eyes Reviewed  Mouth Reviewed  Skin Reviewed  Nails Reviewed       Diet Order:   Diet Order             Diet NPO time specified  Diet effective now                   EDUCATION NEEDS:   No education needs have been identified at this time  Skin:  Skin Assessment: Reviewed RN Assessment  Last BM:  12/2  Height:   Ht Readings from Last 1 Encounters:  04/27/21 5\' 9"  (1.753 m)    Weight:   Wt Readings from Last 1 Encounters:  05/10/21 88.3 kg    BMI:  Body mass index is 28.75 kg/m.  Estimated Nutritional Needs:   Kcal:  2200-2400  Protein:  120-130 gm  Fluid:  2.1-2.3 L    Lucas Mallow, RD, LDN, CNSC Please refer to Amion for contact information.

## 2021-05-11 DIAGNOSIS — J9602 Acute respiratory failure with hypercapnia: Secondary | ICD-10-CM | POA: Diagnosis not present

## 2021-05-11 LAB — COMPREHENSIVE METABOLIC PANEL
ALT: 24 U/L (ref 0–44)
AST: 18 U/L (ref 15–41)
Albumin: 2.3 g/dL — ABNORMAL LOW (ref 3.5–5.0)
Alkaline Phosphatase: 58 U/L (ref 38–126)
Anion gap: 11 (ref 5–15)
BUN: 42 mg/dL — ABNORMAL HIGH (ref 8–23)
CO2: 34 mmol/L — ABNORMAL HIGH (ref 22–32)
Calcium: 8.5 mg/dL — ABNORMAL LOW (ref 8.9–10.3)
Chloride: 100 mmol/L (ref 98–111)
Creatinine, Ser: 1.06 mg/dL (ref 0.61–1.24)
GFR, Estimated: >60 mL/min (ref >60)
Glucose, Bld: 164 mg/dL — ABNORMAL HIGH (ref 70–99)
Potassium: 4.3 mmol/L (ref 3.5–5.1)
Sodium: 145 mmol/L (ref 135–145)
Total Bilirubin: 1 mg/dL (ref 0.3–1.2)
Total Protein: 5 g/dL — ABNORMAL LOW (ref 6.5–8.1)

## 2021-05-11 LAB — CBC
HCT: 37.7 % — ABNORMAL LOW (ref 39.0–52.0)
Hemoglobin: 11.8 g/dL — ABNORMAL LOW (ref 13.0–17.0)
MCH: 28.4 pg (ref 26.0–34.0)
MCHC: 31.3 g/dL (ref 30.0–36.0)
MCV: 90.6 fL (ref 80.0–100.0)
Platelets: 292 10*3/uL (ref 150–400)
RBC: 4.16 MIL/uL — ABNORMAL LOW (ref 4.22–5.81)
RDW: 13.7 % (ref 11.5–15.5)
WBC: 9.9 10*3/uL (ref 4.0–10.5)
nRBC: 0 % (ref 0.0–0.2)

## 2021-05-11 LAB — GLUCOSE, CAPILLARY
Glucose-Capillary: 146 mg/dL — ABNORMAL HIGH (ref 70–99)
Glucose-Capillary: 157 mg/dL — ABNORMAL HIGH (ref 70–99)
Glucose-Capillary: 196 mg/dL — ABNORMAL HIGH (ref 70–99)
Glucose-Capillary: 199 mg/dL — ABNORMAL HIGH (ref 70–99)
Glucose-Capillary: 210 mg/dL — ABNORMAL HIGH (ref 70–99)
Glucose-Capillary: 214 mg/dL — ABNORMAL HIGH (ref 70–99)
Glucose-Capillary: 216 mg/dL — ABNORMAL HIGH (ref 70–99)

## 2021-05-11 LAB — HEPARIN LEVEL (UNFRACTIONATED): Heparin Unfractionated: 0.56 IU/mL (ref 0.30–0.70)

## 2021-05-11 MED ORDER — FUROSEMIDE 10 MG/ML IJ SOLN
40.0000 mg | Freq: Once | INTRAMUSCULAR | Status: AC
  Administered 2021-05-11: 40 mg via INTRAVENOUS
  Filled 2021-05-11: qty 4

## 2021-05-11 MED ORDER — ISOSORBIDE MONONITRATE 20 MG PO TABS
10.0000 mg | ORAL_TABLET | Freq: Two times a day (BID) | ORAL | Status: DC
  Administered 2021-05-11 – 2021-05-20 (×17): 10 mg
  Filled 2021-05-11 (×19): qty 1

## 2021-05-11 MED ORDER — QUETIAPINE FUMARATE 25 MG PO TABS
25.0000 mg | ORAL_TABLET | Freq: Every day | ORAL | Status: DC
  Administered 2021-05-11: 25 mg
  Filled 2021-05-11: qty 1

## 2021-05-11 MED ORDER — DOXAZOSIN MESYLATE 1 MG PO TABS
1.0000 mg | ORAL_TABLET | Freq: Every day | ORAL | Status: AC
  Administered 2021-05-12 – 2021-05-13 (×2): 1 mg
  Filled 2021-05-11 (×2): qty 1

## 2021-05-11 NOTE — Progress Notes (Signed)
NAME:  Ronald [name], MRN:  [id number], DOB:  1945-10-07, LOS: 10 ADMISSION DATE:  05/01/2021, CONSULTATION DATE:  05/01/2021 REFERRING MD:  Dr. Pearline Cables, CHIEF COMPLAINT:  Respiratory Distress    History of Present Illness:  [name] is a 75 year old male with PMHx of COPD GOLD Stage 3-4, tracheal stenosis s/p trach in 2018, CAD s/p DESx2 to LAD in 2015, hypertension, hyperlipidemia, chronic respiratory failure on 2L, HFpEF, type II diabetes presenting with shortness of breath. He was given steroids and duonebs by EMS and placed on CPAP without significant improvement Patient intubated in the ED for airway protection, given steroids, broad spectrum antibiotics and started on levophed for hypotension and admitted to [facility].    Patient had similar presentation for SOB to ED on 11/19 and recommended for admission for COPD exacerbation but declined admission. He was given prednisone and doxycycline on discharge.   Pertinent  Medical History    Anxiety     Asthma     Chronic lower back pain     COPD (chronic obstructive pulmonary disease) (HCC)     Coronary artery disease      a. NSTEMI 05/2014 s/p DESx2 to LAD at [facility].   Depression     Educated about COVID-19 virus infection 03/06/2020   GERD (gastroesophageal reflux disease)     High cholesterol     Hypertension     NSTEMI (non-ST elevated myocardial infarction) ([place]) 05/2014    with stent placement   Sleep apnea     Stroke ([place]) 2017    anyeusym    TIA (transient ischemic attack)      "they say I've had some mini strokes; don't know when"; denies residual on 06/22/2014)   Type II diabetes mellitus ([place])     Ulcerative colitis ([place])    Significant Hospital Events: Including procedures, antibiotic start and stop dates in addition to other pertinent events   11/23 intubated resp distress and severe air trapped; transferred from [facility] to [facility] 11/24 Continued on full vent support for acute respiratory failure and amiodarone gtt and  heparin gtt for Afib with RVR  11/25 Extubated. Re-intubated that night due to tachypnea 11/30 attempt at weaning 12/1 extubated 11/30, difficulty tolerating BiPAP, significant agitation  Interim History / Subjective:  Tolerating BiPAP overnight. Less agitated overnight  Objective   Blood pressure (!) 104/59, pulse 90, temperature 98.8 F (37.1 C), temperature source Axillary, resp. rate (!) 26, weight 85.4 kg, SpO2 97 %.    Vent Mode: BIPAP FiO2 (%):  [40 %-44 %] 40 % Set Rate:  [12 bmp] 12 bmp PEEP:  [6 cmH20] 6 cmH20   Intake/Output Summary (Last 24 hours) at 05/11/2021 1010 Last data filed at 05/11/2021 0946 Gross per 24 hour  Intake 1528.73 ml  Output 4250 ml  Net -2721.27 ml   Filed Weights   05/09/21 0500 05/10/21 0500 05/11/21 0500  Weight: 88.4 kg 88.3 kg 85.4 kg   Physical Exam: General: Chronically ill-appearing, no acute distress HENT: [place], AT, OP clear, MMM Eyes: EOMI, no scleral icterus Respiratory: Diminished breath sounds bilaterally.  No crackles, wheezing or rales Cardiovascular: RRR, -M/R/G, no JVD GI: BS+, soft, nontender Extremities: 2+ pitting edema,-tenderness Neuro: Drowsy but awakens to voice, moves extremities spontaneously x 4 GU: Foley in place  Resolved Hospital Problem list   Shock liver AKI  Assessment & Plan:   Acute metabolic encephalopathy Agitation requiring sedation -PAD protocol for RASS goal -1. Wean Precedex -Continue seroquel nightly  Acute  on chronic hypoxic and hypercapnic respiratory failure Chronic obstructive pulmonary disease History of tracheal stenosis CFB+4 -BiPAP PRN -Transition to oxygen supplementation as tolerated -Continue to monitor for high respiratory risk for intubation -Diurese -Continue bronchodilators: Cont Pulmicort, Duonebs. Add brovana -Wean steroids. Starting 60 mg daily 12/2  Atrial flutter with RVR - rate-controlled -Telemetry -Continue metoprolol -Continue amiodarone -Continue  heparin -We will try to transition to oral medications if able to stay off BiPAP  History of diastolic heart failure Hypertension Echocardiogram with normal function -Cautious diuresis as tolerated  Type 2 diabetes -SSI monitor -Continue Semglee  Urinary retention History of overactive bladder however noted to be requiring catheterization -Foley in place   Best Practice (right click and "Reselect all SmartList Selections" daily)   Diet/type: tubefeeds DVT prophylaxis: systemic heparin GI prophylaxis: PPI Lines: N/A Foley:  Yes, and it is still needed Code Status:  full code Last date of multidisciplinary goals of care discussion [11/25]  The patient is critically ill with multiple organ systems failure and requires high complexity decision making for assessment and support, frequent evaluation and titration of therapies, application of advanced monitoring technologies and extensive interpretation of multiple databases.  Independent Critical Care Time: 38 Minutes.   Rodman Pickle, M.D. [facility] Pulmonary/Critical Care Medicine 05/11/2021 10:11 AM   Please see Amion for pager number to reach on-call Pulmonary and Critical Care Team.

## 2021-05-11 NOTE — Evaluation (Signed)
Clinical/Bedside Swallow Evaluation Patient Details  Name: Ronald [name] MRN: [id number] Date of Birth: Jun 16, 1945  Today's Date: 05/11/2021 Time: SLP Start Time (ACUTE ONLY): 1245 SLP Stop Time (ACUTE ONLY): 1300 SLP Time Calculation (min) (ACUTE ONLY): 15 min  Past Medical History:  Past Medical History:  Diagnosis Date   Anxiety    Asthma    Chronic lower back pain    COPD (chronic obstructive pulmonary disease) (HCC)    Coronary artery disease    a. NSTEMI 05/2014 s/p DESx2 to LAD at [facility].   Depression    Educated about COVID-19 virus infection 03/06/2020   GERD (gastroesophageal reflux disease)    High cholesterol    Hypertension    NSTEMI (non-ST elevated myocardial infarction) ([place]) 05/2014   with stent placement   Sleep apnea    Stroke ([place]) 2017   anyeusym    TIA (transient ischemic attack)    "they say I've had some mini strokes; don't know when"; denies residual on 06/22/2014)   Type II diabetes mellitus ([place])    Ulcerative colitis ([place])    Past Surgical History:  Past Surgical History:  Procedure Laterality Date   APPENDECTOMY     BIOPSY  07/20/2020   Procedure: BIOPSY;  Surgeon: Harvel Quale, MD;  Location: [facility] ENDO SU[facility];  Service: Gastroenterology;;   CARDIAC CATHETERIZATION  [phone] X 3   CHOLECYSTECTOMY     COLONOSCOPY WITH PROPOFOL N/A 07/20/2020   Procedure: COLONOSCOPY WITH PROPOFOL;  Surgeon: Harvel Quale, MD;  Location: [facility] ENDO SUITE;  Service: Gastroenterology;  Laterality: N/A;  1:15   CORONARY ANGIOPLASTY WITH STENT PLACEMENT  05/2014   "2"   ESOPHAGEAL DILATION N/A 07/20/2020   Procedure: ESOPHAGEAL DILATION;  Surgeon: Harvel Quale, MD;  Location: [facility] ENDO SUITE;  Service: Gastroenterology;  Laterality: N/A;   ESOPHAGOGASTRODUODENOSCOPY (EGD) WITH PROPOFOL N/A 07/20/2020   Procedure: ESOPHAGOGASTRODUODENOSCOPY (EGD) WITH PROPOFOL;  Surgeon: Harvel Quale, MD;  Location: [facility] END[facility] SU[facility];  Service:  Gastroenterology;  Laterality: N/A;   LEFT HEART CATH AND CORONARY ANGIOGRAPHY N/A 05/25/2020   Procedure: LEFT HEART CATH AND CORONARY ANGIOGRAPHY;  Surgeon: Martinique, Peter M, MD;  Location: [place] CV LAB;  Service: Cardiovascular;  Laterality: N/A;   POLYPECTOMY  07/20/2020   Procedure: POLYPECTOMY INTESTINAL;  Surgeon: Harvel Quale, MD;  Location: [facility] END[facility];  Service: Gastroenterology;;   TUMOR EXCISION Right ~ 1999   "side of my upper head"   HPI:  This 75 year old Caucasian male transferred from [facility], where he presented with acute worsening of wheezing and dyspnea.  He was recently seen there on 04/27/2021 but refused admission and was sent home with prescription for steroids and antibiotics.  He returned today with worsening of symptoms.  He was intubated in there ER at [facility] for worsening hypercapnea associated with altered mental status.  He was found to be positive for influenza A.  Patient with known underlying tracheal stenosis.  Most recent chest xray was showing new infiltrate in the mid to lower left lung and persistent atelectasis versus consolidatin in the left lung base.    Assessment / Plan / Recommendation  Clinical Impression  Clinical swallowing evaluation was completed in setting of intubation x 7 days over course of two intubations.  The patient complained of trouble swallowing liquids and solids but was unable to elaborate on exacct symptoms.  Cranial nerve exam was attempted and unable to be completed due to his current mentation.  He presented  with a possible oropharyngeal dysphagia.  He was slow to clear material from his oral cavity.  Swallow trigger was appreciated to palpation and he was noted to have cough response given thin liquids.  Given pureed material multiple swallows were noted.  Given clinical presentation, and prolonged intubation suggest patient remain NPO except ice chips with nursing pending re assessment of his swallow for PO  readiness. SLP Visit Diagnosis: Dysphagia, unspecified (R13.10)    Aspiration Risk  Severe aspiration risk    Diet Recommendation   NPO except ice chips with nursing staff  Medication Administration: Via alternative means    Other  Recommendations Oral Care Recommendations: Oral care QID    Recommendations for follow up therapy are one component of a multi-disciplinary discharge planning process, led by the attending physician.  Recommendations may be updated based on patient status, additional functional criteria and insurance authorization.  Follow up Recommendations Other (comment) (TBD)      Assistance Recommended at Discharge Other (comment) (TBD)  Functional Status Assessment  (TBD)  Frequency and Duration min 2x/week  2 weeks       Prognosis Prognosis for Safe Diet Advancement: Good      Swallow Study   General Date of Onset: 05/01/21 HPI: This 75 year old Caucasian male transferred from [facility], where he presented with acute worsening of wheezing and dyspnea.  He was recently seen there on 04/27/2021 but refused admission and was sent home with prescription for steroids and antibiotics.  He returned today with worsening of symptoms.  He was intubated in there ER at [facility] for worsening hypercapnea associated with altered mental status.  He was found to be positive for influenza A.  Patient with known underlying tracheal stenosis.  Most recent chest xray was showing new infiltrate in the mid to lower left lung and persistent atelectasis versus consolidatin in the left lung base. Type of Study: Bedside Swallow Evaluation Previous Swallow Assessment: None noted at [facility]. Diet Prior to this Study: NG Tube Temperature Spikes Noted: No Respiratory Status: Nasal cannula History of Recent Intubation: Yes Length of Intubations (days): 7 days (over two intubations) Date extubated: 05/08/21 Behavior/Cognition: Alert;Cooperative;Requires cueing;Confused Oral Cavity  Assessment: Dry;Dried secretions Oral Care Completed by SLP: No Vision: Functional for self-feeding Self-Feeding Abilities: Total assist Patient Positioning: Upright in bed Baseline Vocal Quality: Low vocal intensity;Breathy Volitional Cough: Cognitively unable to elicit Volitional Swallow: Able to elicit    Oral/Motor/Sensory Function Overall Oral Motor/Sensory Function:  (Unlabe to complete due to patient's mentation.)   Ice Chips Ice chips: Impaired Presentation: Spoon Oral Phase Impairments: Impaired mastication Oral Phase Functional Implications: Prolonged oral transit   Thin Liquid Thin Liquid: Impaired Presentation: Spoon;Cup Pharyngeal  Phase Impairments: Suspected delayed Swallow;Cough - Immediate    Nectar Thick Nectar Thick Liquid: Not tested   Honey Thick Honey Thick Liquid: Not tested   Puree Puree: Impaired Presentation: Spoon Pharyngeal Phase Impairments: Suspected delayed Swallow;Multiple swallows   Solid     Solid: Not tested     Shelly Flatten, MA, CCC-SLP Acute Rehab SLP [phone]  Lamar Sprinkles 05/11/2021,2:07 PM

## 2021-05-11 NOTE — Progress Notes (Signed)
Had to take patient off Bipap and place back on [place] 4L.  Despite the fact that he has had Ativan and maxed out on Precedex, he was still pulling off mask.

## 2021-05-11 NOTE — Progress Notes (Signed)
ANTICOAGULATION CONSULT NOTE - Follow-up Consult  Pharmacy Consult for IV Heparin Indication:  Atrial Fibrillation  Allergies  Allergen Reactions   Gabapentin Anxiety    Unknown reaction   Metformin And Related Rash    Patient Measurements: Total Body Weight: 89 kg Height: 69 inches Heparin Dosing Weight: 89 kg  Vital Signs: Temp: 98.8 F (37.1 C) (12/03 0800) Temp Source: Axillary (12/03 0800) BP: 104/59 (12/03 0928) Pulse Rate: 90 (12/03 0800)  Labs: Recent Labs    05/09/21 0236 05/10/21 0331 05/10/21 0914 05/10/21 0936 05/10/21 1941 05/11/21 0054  HGB 12.7*  --  12.4*  --   --  11.8*  HCT 39.8  --  40.2  --   --  37.7*  PLT 246  --  274  --   --  292  HEPARINUNFRC 0.55 0.64  --   --   --  0.56  CREATININE 1.00  --   --  0.92 1.03 1.06     Estimated Creatinine Clearance: 67.2 mL/min (by C-G formula based on SCr of 1.06 mg/dL).   Medical History: Past Medical History:  Diagnosis Date   Anxiety    Asthma    Chronic lower back pain    COPD (chronic obstructive pulmonary disease) (HCC)    Coronary artery disease    a. NSTEMI 05/2014 s/p DESx2 to LAD at [facility].   Depression    Educated about COVID-19 virus infection 03/06/2020   GERD (gastroesophageal reflux disease)    High cholesterol    Hypertension    NSTEMI (non-ST elevated myocardial infarction) ([place]) 05/2014   with stent placement   Sleep apnea    Stroke ([place]) 2017   anyeusym    TIA (transient ischemic attack)    "they say I've had some mini strokes; don't know when"; denies residual on 06/22/2014)   Type II diabetes mellitus ([place])    Ulcerative colitis ([place])     Assessment: 75 yr old man presented to [facility] with acute respiratory failure, acute on chronic COPD, asthma, pneumonia, chronic dysphagia, circulatory shock, hx diastolic CHF, hx CAD, HLD, HTN, smoking hx, DM2, GERD, hx of anxiety. Pt now transferred to [facility]. Pt with atrial flutter with V rate 130. Pharmacy  is consulted to dose IV heparin for a fib. Pt was on no anticoagulant PTA. He rec'd a dose of Lovenox 40 mg for VTE prophylaxis at 1554 PM 11/23.  Heparin level 0.56 (on 1450 units/hr)  No signs/symptoms of bleed currently  Goal of Therapy:  Heparin level 0.3-0.7 units/ml Monitor platelets by anticoagulation protocol: Yes   Plan:  Continue heparin at 1450 units/hr Monitor daily heparin level, CBC Monitor for bleeding Follow-up long-term anticoagulation plans Ongoing discussion around trach if patients need re-intubation  Thank you for allowing pharmacy to be a part of this patient's care.  Donnald Garre, PharmD Clinical Pharmacist  Please check AMION for all [place] numbers After 10:00 PM, call [place] [phone]

## 2021-05-12 DIAGNOSIS — J9602 Acute respiratory failure with hypercapnia: Secondary | ICD-10-CM | POA: Diagnosis not present

## 2021-05-12 LAB — CBC
HCT: 33.9 % — ABNORMAL LOW (ref 39.0–52.0)
Hemoglobin: 10.7 g/dL — ABNORMAL LOW (ref 13.0–17.0)
MCH: 28.5 pg (ref 26.0–34.0)
MCHC: 31.6 g/dL (ref 30.0–36.0)
MCV: 90.4 fL (ref 80.0–100.0)
Platelets: 264 10*3/uL (ref 150–400)
RBC: 3.75 MIL/uL — ABNORMAL LOW (ref 4.22–5.81)
RDW: 13.6 % (ref 11.5–15.5)
WBC: 10.1 10*3/uL (ref 4.0–10.5)
nRBC: 0 % (ref 0.0–0.2)

## 2021-05-12 LAB — COMPREHENSIVE METABOLIC PANEL
ALT: 26 U/L (ref 0–44)
AST: 18 U/L (ref 15–41)
Albumin: 2.2 g/dL — ABNORMAL LOW (ref 3.5–5.0)
Alkaline Phosphatase: 62 U/L (ref 38–126)
Anion gap: 5 (ref 5–15)
BUN: 40 mg/dL — ABNORMAL HIGH (ref 8–23)
CO2: 36 mmol/L — ABNORMAL HIGH (ref 22–32)
Calcium: 8.2 mg/dL — ABNORMAL LOW (ref 8.9–10.3)
Chloride: 102 mmol/L (ref 98–111)
Creatinine, Ser: 0.95 mg/dL (ref 0.61–1.24)
GFR, Estimated: >60 mL/min (ref >60)
Glucose, Bld: 293 mg/dL — ABNORMAL HIGH (ref 70–99)
Potassium: 4.3 mmol/L (ref 3.5–5.1)
Sodium: 143 mmol/L (ref 135–145)
Total Bilirubin: 0.8 mg/dL (ref 0.3–1.2)
Total Protein: 4.6 g/dL — ABNORMAL LOW (ref 6.5–8.1)

## 2021-05-12 LAB — GLUCOSE, CAPILLARY
Glucose-Capillary: 234 mg/dL — ABNORMAL HIGH (ref 70–99)
Glucose-Capillary: 166 mg/dL — ABNORMAL HIGH (ref 70–99)
Glucose-Capillary: 96 mg/dL (ref 70–99)
Glucose-Capillary: 218 mg/dL — ABNORMAL HIGH (ref 70–99)
Glucose-Capillary: 316 mg/dL — ABNORMAL HIGH (ref 70–99)

## 2021-05-12 LAB — HEPARIN LEVEL (UNFRACTIONATED): Heparin Unfractionated: 0.59 IU/mL (ref 0.30–0.70)

## 2021-05-12 MED ORDER — METOPROLOL TARTRATE 25 MG PO TABS
50.0000 mg | ORAL_TABLET | Freq: Two times a day (BID) | ORAL | Status: DC
Start: 2021-05-12 — End: 2021-05-15
  Administered 2021-05-12 – 2021-05-14 (×5): 50 mg
  Filled 2021-05-12 (×6): qty 2

## 2021-05-12 MED ORDER — METHYLPREDNISOLONE SODIUM SUCC 125 MG IJ SOLR
40.0000 mg | Freq: Every day | INTRAMUSCULAR | Status: DC
Start: 2021-05-13 — End: 2021-05-14
  Administered 2021-05-13: 40 mg via INTRAVENOUS
  Filled 2021-05-12 (×2): qty 2

## 2021-05-12 MED ORDER — FUROSEMIDE 10 MG/ML IJ SOLN
60.0000 mg | Freq: Once | INTRAMUSCULAR | Status: AC
  Administered 2021-05-12: 15:00:00 60 mg via INTRAVENOUS
  Filled 2021-05-12: qty 6

## 2021-05-12 MED ORDER — APIXABAN 5 MG PO TABS
5.0000 mg | ORAL_TABLET | Freq: Two times a day (BID) | ORAL | Status: DC
  Administered 2021-05-12 – 2021-05-13 (×2): 5 mg via ORAL
  Filled 2021-05-12 (×2): qty 1

## 2021-05-12 MED ORDER — QUETIAPINE FUMARATE 50 MG PO TABS
50.0000 mg | ORAL_TABLET | Freq: Every day | ORAL | Status: DC
  Administered 2021-05-12: 21:00:00 50 mg
  Filled 2021-05-12 (×2): qty 1

## 2021-05-12 NOTE — Progress Notes (Signed)
SLP Cancellation Note  Patient Details Name: [name] MRN: [id number] DOB: 31-May-1946   Cancelled treatment:       Reason Eval/Treat Not Completed: Patient not medically ready (Pt was approached for treatment. RR was 33-40 with increased WOB. RN aware and attributed it to pt's refusal of biPAP overnight. Pt currently has Cortrak and tx deferred considering pt's increased aspiration risk due to potential for swallowing-breathing dyssynchrony.)  Kennet Mccort I. Hardin Negus, [place], [place] Office number [phone] Pager [phone]  Horton Marshall 05/12/2021, 8:59 AM

## 2021-05-12 NOTE — Progress Notes (Signed)
ANTICOAGULATION CONSULT NOTE - Follow-up Consult  Pharmacy Consult for IV Heparin Indication:  Atrial Fibrillation  Allergies  Allergen Reactions   Gabapentin Anxiety    Unknown reaction   Metformin And Related Rash    Patient Measurements: Total Body Weight: 89 kg Height: 69 inches Heparin Dosing Weight: 89 kg  Vital Signs: Temp: 97.7 F (36.5 C) (12/04 0337) Temp Source: Axillary (12/04 0337) BP: 122/66 (12/04 0600) Pulse Rate: 91 (12/04 0600)  Labs: Recent Labs     0000 05/10/21 0331 05/10/21 0914 05/10/21 0936 05/10/21 1941 05/11/21 0054 05/12/21 0101  HGB   < >  --  12.4*  --   --  11.8* 10.7*  HCT  --   --  40.2  --   --  37.7* 33.9*  PLT  --   --  274  --   --  292 264  HEPARINUNFRC  --  0.64  --   --   --  0.56 0.59  CREATININE  --   --   --    < > 1.03 1.06 0.95   < > = values in this interval not displayed.     Estimated Creatinine Clearance: 75.3 mL/min (by C-G formula based on SCr of 0.95 mg/dL).   Medical History: Past Medical History:  Diagnosis Date   Anxiety    Asthma    Chronic lower back pain    COPD (chronic obstructive pulmonary disease) (HCC)    Coronary artery disease    a. NSTEMI 05/2014 s/p DESx2 to LAD at [facility].   Depression    Educated about COVID-19 virus infection 03/06/2020   GERD (gastroesophageal reflux disease)    High cholesterol    Hypertension    NSTEMI (non-ST elevated myocardial infarction) ([place]) 05/2014   with stent placement   Sleep apnea    Stroke ([place]) 2017   anyeusym    TIA (transient ischemic attack)    "they say I've had some mini strokes; don't know when"; denies residual on 06/22/2014)   Type II diabetes mellitus ([place])    Ulcerative colitis ([place])     Assessment: 75 yr old man presented to [facility] with acute respiratory failure, acute on chronic COPD, asthma, pneumonia, chronic dysphagia, circulatory shock, hx diastolic CHF, hx CAD, HLD, HTN, smoking hx, DM2, GERD, hx of anxiety. Pt now  transferred to [facility]. Pt with atrial flutter with V rate 130. Pharmacy is consulted to dose IV heparin for a fib. Pt was on no anticoagulant PTA. He rec'd a dose of Lovenox 40 mg for VTE prophylaxis at 1554 PM 11/23.  Heparin level 0.59 (on 1450 units/hr)  No signs/symptoms of bleed currently  Goal of Therapy:  Heparin level 0.3-0.7 units/ml Monitor platelets by anticoagulation protocol: Yes   Plan:  Continue heparin at 1450 units/hr Monitor daily heparin level, CBC Monitor for bleeding Follow-up long-term anticoagulation plans Ongoing discussion around trach if patients need re-intubation  Thank you for allowing pharmacy to be a part of this patient's care.  Donnald Garre, PharmD Clinical Pharmacist  Please check AMION for all [place] numbers After 10:00 PM, call [place] [phone]

## 2021-05-12 NOTE — Progress Notes (Signed)
NAME:  Ronald [name], MRN:  [id number], DOB:  23-Apr-1946, LOS: 36 ADMISSION DATE:  05/01/2021, CONSULTATION DATE:  05/01/2021 REFERRING MD:  Dr. Pearline Cables, CHIEF COMPLAINT:  Respiratory Distress    History of Present Illness:  Mr Daishawn Lauf is a 75 year old male with PMHx of COPD GOLD Stage 3-4, tracheal stenosis s/p trach in 2018, CAD s/p DESx2 to LAD in 2015, hypertension, hyperlipidemia, chronic respiratory failure on 2L, HFpEF, type II diabetes presenting with shortness of breath. He was given steroids and duonebs by EMS and placed on CPAP without significant improvement Patient intubated in the ED for airway protection, given steroids, broad spectrum antibiotics and started on levophed for hypotension and admitted to [facility].    Patient had similar presentation for SOB to ED on 11/19 and recommended for admission for COPD exacerbation but declined admission. He was given prednisone and doxycycline on discharge.   Pertinent  Medical History    Anxiety     Asthma     Chronic lower back pain     COPD (chronic obstructive pulmonary disease) (HCC)     Coronary artery disease      a. NSTEMI 05/2014 s/p DESx2 to LAD at [facility].   Depression     Educated about COVID-19 virus infection 03/06/2020   GERD (gastroesophageal reflux disease)     High cholesterol     Hypertension     NSTEMI (non-ST elevated myocardial infarction) ([place]) 05/2014    with stent placement   Sleep apnea     Stroke ([place]) 2017    anyeusym    TIA (transient ischemic attack)      "they say I've had some mini strokes; don't know when"; denies residual on 06/22/2014)   Type II diabetes mellitus ([place])     Ulcerative colitis ([place])    Significant Hospital Events: Including procedures, antibiotic start and stop dates in addition to other pertinent events   11/23 intubated resp distress and severe air trapped; transferred from [facility] to [facility] 11/24 Continued on full vent support for acute respiratory failure and amiodarone gtt and  heparin gtt for Afib with RVR  11/25 Extubated. Re-intubated that night due to tachypnea 11/30 attempt at weaning 12/1 extubated 11/30, difficulty tolerating BiPAP, significant agitation 12/3 Remains on Precedex due to agitation  Interim History / Subjective:  Overnight was agitated and unable to tolerate BiPAP  Objective   Blood pressure 95/70, pulse 98, temperature 97.7 F (36.5 C), temperature source Axillary, resp. rate (!) 31, weight 86.2 kg, SpO2 97 %.    Vent Mode: BIPAP FiO2 (%):  [40 %] 40 % Set Rate:  [12 bmp] 12 bmp PEEP:  [6 cmH20] 6 cmH20   Intake/Output Summary (Last 24 hours) at 05/12/2021 1431 Last data filed at 05/12/2021 1200 Gross per 24 hour  Intake 1898.24 ml  Output 1300 ml  Net 598.24 ml   Filed Weights   05/10/21 0500 05/11/21 0500 05/12/21 0335  Weight: 88.3 kg 85.4 kg 86.2 kg   Physical Exam: General: Chronically ill-appearing, intermittently agitated HENT: [place], AT, OP clear, MMM Eyes: EOMI, no scleral icterus Respiratory: Diminished breath sounds bilaterally.  No crackles, wheezing or rales Cardiovascular: RRR, -M/R/G, no JVD GI: BS+, soft, nontender Extremities: 2+ pitting edema,-tenderness Neuro: Awake, encephalopathic, intermittently follows commands GU: Foley in place  BMET appropriate Stable anemia  Resolved Hospital Problem list   Shock liver AKI  Assessment & Plan:   Acute metabolic encephalopathy Agitation requiring sedation -PAD protocol for RASS goal -1. Wean  Precedex -Increase seroquel nightly  Acute on chronic hypoxic and hypercapnic respiratory failure Chronic obstructive pulmonary disease History of tracheal stenosis CFB+4 -BiPAP PRN -Transition to oxygen supplementation as tolerated -Continue to monitor for high respiratory risk for intubation -Diurese -Continue bronchodilators: Cont Pulmicort, Duonebs. Add brovana -Wean steroids. 40 mg 12/4  Atrial flutter with RVR - rate-controlled -Telemetry -Continue  metoprolol -Continue amiodarone -Continue heparin -We will try to transition to oral medications if able to stay off BiPAP  History of diastolic heart failure Hypertension Echocardiogram with normal function -Cautious diuresis as tolerated  Type 2 diabetes -SSI monitor -Continue Semglee  Urinary retention History of overactive bladder however noted to be requiring catheterization -Foley in place   Best Practice (right click and "Reselect all SmartList Selections" daily)   Diet/type: tubefeeds DVT prophylaxis: systemic heparin GI prophylaxis: PPI Lines: N/A Foley:  Yes, and it is still needed Code Status:  full code Last date of multidisciplinary goals of care discussion [11/25]   The patient is critically ill with multiple organ systems failure and requires high complexity decision making for assessment and support, frequent evaluation and titration of therapies, application of advanced monitoring technologies and extensive interpretation of multiple databases.  Independent Critical Care Time: 54 Minutes.   Rodman Pickle, M.D. [facility] Pulmonary/Critical Care Medicine 05/12/2021 2:38 PM   Please see Amion for pager number to reach on-call Pulmonary and Critical Care Team.

## 2021-05-13 DIAGNOSIS — E440 Moderate protein-calorie malnutrition: Secondary | ICD-10-CM | POA: Insufficient documentation

## 2021-05-13 DIAGNOSIS — J9602 Acute respiratory failure with hypercapnia: Secondary | ICD-10-CM | POA: Diagnosis not present

## 2021-05-13 LAB — COMPREHENSIVE METABOLIC PANEL
ALT: 24 U/L (ref 0–44)
AST: 22 U/L (ref 15–41)
Albumin: 2 g/dL — ABNORMAL LOW (ref 3.5–5.0)
Alkaline Phosphatase: 60 U/L (ref 38–126)
Anion gap: 4 — ABNORMAL LOW (ref 5–15)
BUN: 39 mg/dL — ABNORMAL HIGH (ref 8–23)
CO2: 38 mmol/L — ABNORMAL HIGH (ref 22–32)
Calcium: 8.4 mg/dL — ABNORMAL LOW (ref 8.9–10.3)
Chloride: 102 mmol/L (ref 98–111)
Creatinine, Ser: 1.01 mg/dL (ref 0.61–1.24)
GFR, Estimated: >60 mL/min (ref >60)
Glucose, Bld: 178 mg/dL — ABNORMAL HIGH (ref 70–99)
Potassium: 4.8 mmol/L (ref 3.5–5.1)
Sodium: 144 mmol/L (ref 135–145)
Total Bilirubin: 0.6 mg/dL (ref 0.3–1.2)
Total Protein: 4.4 g/dL — ABNORMAL LOW (ref 6.5–8.1)

## 2021-05-13 LAB — GLUCOSE, CAPILLARY
Glucose-Capillary: 183 mg/dL — ABNORMAL HIGH (ref 70–99)
Glucose-Capillary: 186 mg/dL — ABNORMAL HIGH (ref 70–99)
Glucose-Capillary: 198 mg/dL — ABNORMAL HIGH (ref 70–99)
Glucose-Capillary: 203 mg/dL — ABNORMAL HIGH (ref 70–99)
Glucose-Capillary: 258 mg/dL — ABNORMAL HIGH (ref 70–99)
Glucose-Capillary: 265 mg/dL — ABNORMAL HIGH (ref 70–99)
Glucose-Capillary: 277 mg/dL — ABNORMAL HIGH (ref 70–99)

## 2021-05-13 LAB — CBC
HCT: 30.1 % — ABNORMAL LOW (ref 39.0–52.0)
Hemoglobin: 9.4 g/dL — ABNORMAL LOW (ref 13.0–17.0)
MCH: 28.4 pg (ref 26.0–34.0)
MCHC: 31.2 g/dL (ref 30.0–36.0)
MCV: 90.9 fL (ref 80.0–100.0)
Platelets: 248 10*3/uL (ref 150–400)
RBC: 3.31 MIL/uL — ABNORMAL LOW (ref 4.22–5.81)
RDW: 13.5 % (ref 11.5–15.5)
WBC: 13.5 10*3/uL — ABNORMAL HIGH (ref 4.0–10.5)
nRBC: 0 % (ref 0.0–0.2)

## 2021-05-13 MED ORDER — ALPRAZOLAM 0.5 MG PO TABS
0.5000 mg | ORAL_TABLET | Freq: Two times a day (BID) | ORAL | Status: DC | PRN
  Administered 2021-05-13: 0.5 mg via ORAL
  Filled 2021-05-13: qty 1

## 2021-05-13 MED ORDER — QUETIAPINE FUMARATE 50 MG PO TABS
50.0000 mg | ORAL_TABLET | Freq: Two times a day (BID) | ORAL | Status: DC
  Administered 2021-05-13 – 2021-05-14 (×3): 50 mg
  Filled 2021-05-13 (×3): qty 1

## 2021-05-13 MED ORDER — ALPRAZOLAM 0.5 MG PO TABS
0.5000 mg | ORAL_TABLET | Freq: Two times a day (BID) | ORAL | Status: DC | PRN
  Administered 2021-05-14: 0.5 mg
  Filled 2021-05-13: qty 1

## 2021-05-13 MED ORDER — APIXABAN 5 MG PO TABS
5.0000 mg | ORAL_TABLET | Freq: Two times a day (BID) | ORAL | Status: DC
  Administered 2021-05-13 – 2021-05-17 (×7): 5 mg
  Filled 2021-05-13 (×7): qty 1

## 2021-05-13 NOTE — Progress Notes (Signed)
NAME:  MAKENA MCGRADY, MRN:  [id number], DOB:  Nov 01, 1945, LOS: 12 ADMISSION DATE:  05/01/2021, CONSULTATION DATE:  05/01/2021 REFERRING MD:  Dr. Pearline Cables, CHIEF COMPLAINT:  Respiratory Distress    History of Present Illness:  Mr Janet Decesare is a 75 year old male with PMHx of COPD GOLD Stage 3-4, tracheal stenosis s/p trach in 2018, CAD s/p DESx2 to LAD in 2015, hypertension, hyperlipidemia, chronic respiratory failure on 2L, HFpEF, type II diabetes presenting with shortness of breath. He was given steroids and duonebs by EMS and placed on CPAP without significant improvement Patient intubated in the ED for airway protection, given steroids, broad spectrum antibiotics and started on levophed for hypotension and admitted to [facility].    Patient had similar presentation for SOB to ED on 11/19 and recommended for admission for COPD exacerbation but declined admission. He was given prednisone and doxycycline on discharge.   Pertinent  Medical History    Anxiety     Asthma     Chronic lower back pain     COPD (chronic obstructive pulmonary disease) (HCC)     Coronary artery disease      a. NSTEMI 05/2014 s/p DESx2 to LAD at [facility].   Depression     Educated about COVID-19 virus infection 03/06/2020   GERD (gastroesophageal reflux disease)     High cholesterol     Hypertension     NSTEMI (non-ST elevated myocardial infarction) ([place]) 05/2014    with stent placement   Sleep apnea     Stroke ([place]) 2017    anyeusym    TIA (transient ischemic attack)      "they say I've had some mini strokes; don't know when"; denies residual on 06/22/2014)   Type II diabetes mellitus ([place])     Ulcerative colitis ([place])    Significant Hospital Events: Including procedures, antibiotic start and stop dates in addition to other pertinent events   11/23 intubated resp distress and severe air trapped; transferred from [facility] to [facility] 11/24 Continued on full vent support for acute respiratory failure and amiodarone gtt and  heparin gtt for Afib with RVR  11/25 Extubated. Re-intubated that night due to tachypnea 11/30 attempt at weaning 12/1 extubated 11/30, difficulty tolerating BiPAP, significant agitation 12/3 Remains on Precedex due to agitation  Interim History / Subjective:  No significant overnight events; tolerated BiPAP overnight This morning, remains on precedex gtt for agitation. On 2L O2 via [place].    Objective   Blood pressure (!) 103/54, pulse 76, temperature 98.5 F (36.9 C), temperature source Axillary, resp. rate (!) 26, weight 84.5 kg, SpO2 100 %.    Vent Mode: BIPAP;PSV FiO2 (%):  [50 %] 50 % Set Rate:  [12 bmp] 12 bmp PEEP:  [6 cmH20] 6 cmH20   Intake/Output Summary (Last 24 hours) at 05/13/2021 0720 Last data filed at 05/13/2021 0400 Gross per 24 hour  Intake 2439.48 ml  Output 2525 ml  Net -85.52 ml   Filed Weights   05/11/21 0500 05/12/21 0335 05/13/21 0345  Weight: 85.4 kg 86.2 kg 84.5 kg   Physical Exam: General: Chronically ill-appearing, intermittently agitated HENT: [place], AT, OP clear, MMM Eyes: EOMI, no scleral icterus Respiratory: Diminished breath sounds bilaterally.  No crackles, wheezing or rales Cardiovascular: RRR, -M/R/G, no JVD GI: BS+, soft, nontender Extremities: significant bilat upper extremity edema Neuro: Arousable to voice; not following commands GU: Foley in place  Resolved Hospital Problem list   Shock liver AKI  Assessment & Plan:  Acute  metabolic encephalopathy Agitation requiring sedation -PAD protocol for RASS goal -1. Wean Precedex -Increase seroquel to bid  Acute on chronic hypoxic and hypercapnic respiratory failure Chronic obstructive pulmonary disease History of tracheal stenosis -BiPAP nightly -Transition to oxygen supplementation as tolerated, goal SpO2 88-92% -Continue to monitor for high respiratory risk for intubation -Continue bronchodilators: Cont Pulmicort, Duonebs, and brovana -Wean steroids. 40 mg 12/4  Atrial flutter  with RVR  - Continue metoprolol and amiodarone  -Continue cardiac monitoring  -Continue Eliquis bid   History of diastolic heart failure Hypertension Echocardiogram with normal function -Cautious diuresis as tolerated  Normocytic anemia Hb trending down over past several days; no obvious signs of bleeding at this time.  - Trend CBC  - Transfuse goal Hb >8  Type 2 diabetes -Semglee 25U + SSI  - CBG monitoring with goal 140-180  Urinary retention History of overactive bladder however noted to be requiring catheterization. Home cardura resumed.  -Foley in place, voiding trials as able  Best Practice (right click and "Reselect all SmartList Selections" daily)   Diet/type: tubefeeds DVT prophylaxis: DOAC GI prophylaxis: PPI Lines: N/A Foley:  Yes, and it is still needed Code Status:  full code Last date of multidisciplinary goals of care discussion []    Harvie Heck, MD Internal Medicine, PGY-3 05/13/21 2:37 PM Pager # [phone]

## 2021-05-13 NOTE — Progress Notes (Signed)
Speech Language Pathology Treatment: Dysphagia  Patient Details Name: Ronald [name] MRN: [id number] DOB: 12-07-1945 Today's Date: 05/13/2021 Time: 1020-1030 SLP Time Calculation (min) (ACUTE ONLY): 10 min  Assessment / Plan / Recommendation Clinical Impression  Pt alert and upright, dry oral mucosa, eager for PO. Pts respiratory rate is still rapid and effortful. Pt easily breathless with phrase length speech. Pt tolerated ice and puree well, but with sips of thin liquids he seemed to not be ready for swallow, had a surprised look and often a wet hard cough. Suspect poor coordination of breathing and swallowing. SLP gave pt a cue for a small sips with a brief oral hold to control bolus, pt appeared successful and did not cough. However, he did not even attempt to carry over the strategy. Recommend ongoing NPO except for puree and ice to help heal oropharyngeal mucosa. SLP will plan for FEES tentatively tomorrow for instrumental assessment of swallowing.   HPI HPI: This 75 year old Caucasian male transferred from [facility], where he presented with acute worsening of wheezing and dyspnea.  He was recently seen there on 04/27/2021 but refused admission and was sent home with prescription for steroids and antibiotics.  He returned today with worsening of symptoms.  He was intubated in there ER at [facility] for worsening hypercapnea associated with altered mental status.  He was found to be positive for influenza A.  Patient with known underlying tracheal stenosis.  Most recent chest xray was showing new infiltrate in the mid to lower left lung and persistent atelectasis versus consolidatin in the left lung base.      SLP Plan  Continue with current plan of care;Other (Comment) (FEES)      Recommendations for follow up therapy are one component of a multi-disciplinary discharge planning process, led by the attending physician.  Recommendations may be updated based on patient status, additional  functional criteria and insurance authorization.    Recommendations  Diet recommendations: NPO;Other(comment) (except for ice and spoons of puree)                Oral Care Recommendations: Oral care QID Follow Up Recommendations: Skilled nursing-short term rehab (<3 hours/day) Plan: Continue with current plan of care;Other (Comment) (FEES)       GO                Ronald [name], Katherene Ponto  05/13/2021, 11:52 AM

## 2021-05-13 NOTE — Progress Notes (Deleted)
   Inpatient Rehab Admissions Coordinator :  Per therapy recommendations patient was screened for CIR candidacy by Ikeem Cleckler RN MSN. Patient is not yet at a level to tolerate the intensity required to pursue a CIR admit . Patient may have the potential to progress to become a candidate. The CIR admissions team will follow and monitor for progress and place a Rehab Consult order if felt to be appropriate. Please contact me with any questions.  Arabella Revelle RN MSN Admissions Coordinator [phone]  

## 2021-05-13 NOTE — Evaluation (Addendum)
Physical Therapy Evaluation Patient Details Name: Ronald [name] MRN: [id number] DOB: 1945/09/10 Today's Date: 05/13/2021  History of Present Illness  75 y/o male who presented on 11/23 w/ SOB with acute respiratory failure requiring intubation in ED, unsuccessful extubation 11/25. Pt successfully extubated on 12/1 with some difficulty tolerating BiPAP. PMHx: COPD, tracheal stenosis, chronic respiratory failure on 2L, CAD, DM II, HTN, HLD  Clinical Impression  Pt pleasant with significant confusion oriented only to self. Pt with decreased strength, balance, function and mobility who will benefit from acute therapy to maximize mobility, safety and function. Pt reports he lives with ex-wife and is normally able to care for himself with intermittent assist from her. Pt limited by cognition and encouraged increased mobility with chair position and EOB with nursing staff.   Pt on 2L throughout session with SpO2 96% HR 112       Recommendations for follow up therapy are one component of a multi-disciplinary discharge planning process, led by the attending physician.  Recommendations may be updated based on patient status, additional functional criteria and insurance authorization.  Follow Up Recommendations Short term rehab (<3hrs /day)    Assistance Recommended at Discharge Frequent or constant Supervision/Assistance  Functional Status Assessment Patient has had a recent decline in their functional status and demonstrates the ability to make significant improvements in function in a reasonable and predictable amount of time.  Equipment Recommendations  Rolling walker (2 wheels);BSC/3in1    Recommendations for Other Services OT consult     Precautions / Restrictions Precautions Precautions: Fall Precaution Comments: cortrak, flexiseal Restrictions Weight Bearing Restrictions: No      Mobility  Bed Mobility Overal bed mobility: Needs Assistance Bed Mobility: Supine to Sit     Supine to  sit: HOB elevated;Min guard     General bed mobility comments: minguard with increased time to pivot to left side of bed with HOB 25 degrees. mod cues for sequence and completion of task    Transfers Overall transfer level: Needs assistance   Transfers: Sit to/from Stand Sit to Stand: Mod assist;+2 physical assistance           General transfer comment: mod +2 with bil knees blocked and use of pad and belt to rise from surface with pt unable to weight shift or step and maintained knee flexion    Ambulation/Gait               General Gait Details: unable  Stairs            Wheelchair Mobility    Modified Rankin (Stroke Patients Only)       Balance Overall balance assessment: Needs assistance Sitting-balance support: Bilateral upper extremity supported;Feet supported Sitting balance-Leahy Scale: Fair Sitting balance - Comments: EOb with cues for posture   Standing balance support: Bilateral upper extremity supported Standing balance-Leahy Scale: Poor Standing balance comment: bil knees blocked with belt and pad to control pelvis                             Pertinent Vitals/Pain Pain Assessment: No/denies pain Breathing: normal Negative Vocalization: none Facial Expression: smiling or inexpressive Body Language: relaxed Consolability: no need to console PAINAD Score: 0    Home Living Family/patient expects to be discharged to:: Private residence Living Arrangements: Other relatives (ex-wife) Available Help at Discharge: Family;Available PRN/intermittently (ex-wife) Type of Home: House       Alternate Level Stairs-Number of Steps: 10 Home Layout: Two  level;Bed/bath upstairs Home Equipment: None Additional Comments: Pt is a poor historian and uncertain of accuracy    Prior Function Prior Level of Function : Needs assist;Patient poor historian/Family not available       Physical Assist : ADLs (physical)       ADLs Comments:  Ex-wife assists with bathing and dressing. Pt is a poor historian due to being only oriented to self.     Hand Dominance        Extremity/Trunk Assessment   Upper Extremity Assessment Upper Extremity Assessment: Generalized weakness    Lower Extremity Assessment Lower Extremity Assessment: Generalized weakness    Cervical / Trunk Assessment Cervical / Trunk Assessment: Kyphotic  Communication      Cognition Arousal/Alertness: Awake/alert Behavior During Therapy: WFL for tasks assessed/performed Overall Cognitive Status: Impaired/Different from baseline Area of Impairment: Orientation;Memory;Following commands;Awareness;Attention;Safety/judgement                 Orientation Level: Disoriented to;Place;Time;Situation Current Attention Level: Sustained Memory: Decreased short-term memory Following Commands: Follows one step commands inconsistently;Follows one step commands with increased time;Follows multi-step commands inconsistently Safety/Judgement: Decreased awareness of safety;Decreased awareness of deficits Awareness: Intellectual   General Comments: Pt only oriented to self        General Comments      Exercises     Assessment/Plan    PT Assessment Patient needs continued PT services  PT Problem List Decreased strength;Decreased mobility;Decreased safety awareness;Decreased activity tolerance;Decreased balance;Decreased knowledge of use of DME;Cardiopulmonary status limiting activity;Decreased cognition       PT Treatment Interventions DME instruction;Therapeutic exercise;Gait training;Functional mobility training;Cognitive remediation;Therapeutic activities;Patient/family education;Balance training    PT Goals (Current goals can be found in the Care Plan section)  Acute Rehab PT Goals Patient Stated Goal: return home PT Goal Formulation: With patient Time For Goal Achievement: 05/27/21 Potential to Achieve Goals: Fair    Frequency Min 3X/week    Barriers to discharge Decreased caregiver support      Co-evaluation               AM-PAC PT "6 Clicks" Mobility  Outcome Measure Help needed turning from your back to your side while in a flat bed without using bedrails?: A Little Help needed moving from lying on your back to sitting on the side of a flat bed without using bedrails?: A Little Help needed moving to and from a bed to a chair (including a wheelchair)?: Total Help needed standing up from a chair using your arms (e.g., wheelchair or bedside chair)?: Total Help needed to walk in hospital room?: Total Help needed climbing 3-5 steps with a railing? : Total 6 Click Score: 10    End of Session Equipment Utilized During Treatment: Gait belt Activity Tolerance: Patient tolerated treatment well Patient left: in bed;with call bell/phone within reach;with bed alarm set Nurse Communication: Mobility status PT Visit Diagnosis: Other abnormalities of gait and mobility (R26.89);Difficulty in walking, not elsewhere classified (R26.2);Muscle weakness (generalized) (M62.81)    Time: 4403-4742 PT Time Calculation (min) (ACUTE ONLY): 25 min   Charges:   PT Evaluation $PT Eval Moderate Complexity: 1 Mod PT Treatments $Therapeutic Activity: 8-22 mins        Patrica Mendell P, PT Acute Rehabilitation Services Pager: [phone] Office: [place] [facility] [place] 05/13/2021, 10:53 AM

## 2021-05-14 DIAGNOSIS — J9602 Acute respiratory failure with hypercapnia: Secondary | ICD-10-CM | POA: Diagnosis not present

## 2021-05-14 LAB — BASIC METABOLIC PANEL
Anion gap: 7 (ref 5–15)
BUN: 36 mg/dL — ABNORMAL HIGH (ref 8–23)
CO2: 34 mmol/L — ABNORMAL HIGH (ref 22–32)
Calcium: 8.3 mg/dL — ABNORMAL LOW (ref 8.9–10.3)
Chloride: 97 mmol/L — ABNORMAL LOW (ref 98–111)
Creatinine, Ser: 0.9 mg/dL (ref 0.61–1.24)
GFR, Estimated: >60 mL/min (ref >60)
Glucose, Bld: 169 mg/dL — ABNORMAL HIGH (ref 70–99)
Potassium: 5.4 mmol/L — ABNORMAL HIGH (ref 3.5–5.1)
Sodium: 138 mmol/L (ref 135–145)

## 2021-05-14 LAB — CBC
HCT: 28.7 % — ABNORMAL LOW (ref 39.0–52.0)
Hemoglobin: 9.1 g/dL — ABNORMAL LOW (ref 13.0–17.0)
MCH: 28.7 pg (ref 26.0–34.0)
MCHC: 31.7 g/dL (ref 30.0–36.0)
MCV: 90.5 fL (ref 80.0–100.0)
Platelets: 237 10*3/uL (ref 150–400)
RBC: 3.17 MIL/uL — ABNORMAL LOW (ref 4.22–5.81)
RDW: 13.6 % (ref 11.5–15.5)
WBC: 10.9 10*3/uL — ABNORMAL HIGH (ref 4.0–10.5)
nRBC: 0 % (ref 0.0–0.2)

## 2021-05-14 LAB — COMPREHENSIVE METABOLIC PANEL
ALT: 23 U/L (ref 0–44)
AST: 28 U/L (ref 15–41)
Albumin: 2 g/dL — ABNORMAL LOW (ref 3.5–5.0)
Alkaline Phosphatase: 73 U/L (ref 38–126)
Anion gap: 6 (ref 5–15)
BUN: 38 mg/dL — ABNORMAL HIGH (ref 8–23)
CO2: 35 mmol/L — ABNORMAL HIGH (ref 22–32)
Calcium: 8.3 mg/dL — ABNORMAL LOW (ref 8.9–10.3)
Chloride: 96 mmol/L — ABNORMAL LOW (ref 98–111)
Creatinine, Ser: 0.97 mg/dL (ref 0.61–1.24)
GFR, Estimated: >60 mL/min (ref >60)
Glucose, Bld: 291 mg/dL — ABNORMAL HIGH (ref 70–99)
Potassium: 5.4 mmol/L — ABNORMAL HIGH (ref 3.5–5.1)
Sodium: 137 mmol/L (ref 135–145)
Total Bilirubin: 0.6 mg/dL (ref 0.3–1.2)
Total Protein: 4.7 g/dL — ABNORMAL LOW (ref 6.5–8.1)

## 2021-05-14 LAB — GLUCOSE, CAPILLARY
Glucose-Capillary: 104 mg/dL — ABNORMAL HIGH (ref 70–99)
Glucose-Capillary: 124 mg/dL — ABNORMAL HIGH (ref 70–99)
Glucose-Capillary: 243 mg/dL — ABNORMAL HIGH (ref 70–99)
Glucose-Capillary: 253 mg/dL — ABNORMAL HIGH (ref 70–99)
Glucose-Capillary: 281 mg/dL — ABNORMAL HIGH (ref 70–99)
Glucose-Capillary: 307 mg/dL — ABNORMAL HIGH (ref 70–99)

## 2021-05-14 MED ORDER — INSULIN GLARGINE-YFGN 100 UNIT/ML ~~LOC~~ SOLN
30.0000 [IU] | Freq: Two times a day (BID) | SUBCUTANEOUS | Status: DC
  Administered 2021-05-14 – 2021-05-15 (×4): 30 [IU] via SUBCUTANEOUS
  Filled 2021-05-14 (×6): qty 0.3

## 2021-05-14 MED ORDER — METHYLPREDNISOLONE SODIUM SUCC 40 MG IJ SOLR
20.0000 mg | Freq: Every day | INTRAMUSCULAR | Status: DC
Start: 2021-05-14 — End: 2021-05-16
  Administered 2021-05-14 – 2021-05-16 (×3): 20 mg via INTRAVENOUS
  Filled 2021-05-14 (×3): qty 0.5

## 2021-05-14 MED ORDER — SODIUM ZIRCONIUM CYCLOSILICATE 5 G PO PACK
5.0000 g | PACK | Freq: Once | ORAL | Status: AC
  Administered 2021-05-14: 5 g
  Filled 2021-05-14 (×2): qty 1

## 2021-05-14 MED ORDER — PROSOURCE TF PO LIQD
45.0000 mL | Freq: Four times a day (QID) | ORAL | Status: DC
Start: 2021-05-14 — End: 2021-05-30
  Administered 2021-05-14 – 2021-05-30 (×64): 45 mL
  Filled 2021-05-14 (×62): qty 45

## 2021-05-14 MED ORDER — INSULIN GLARGINE-YFGN 100 UNIT/ML ~~LOC~~ SOLN
30.0000 [IU] | Freq: Every day | SUBCUTANEOUS | Status: DC
  Filled 2021-05-14: qty 0.3

## 2021-05-14 MED ORDER — ALBUMIN HUMAN 25 % IV SOLN
12.5000 g | Freq: Once | INTRAVENOUS | Status: AC
  Administered 2021-05-14: 12.5 g via INTRAVENOUS
  Filled 2021-05-14: qty 50

## 2021-05-14 MED ORDER — OSMOLITE 1.5 CAL PO LIQD
1000.0000 mL | ORAL | Status: DC
  Administered 2021-05-14 – 2021-05-15 (×2): 1000 mL
  Filled 2021-05-14 (×3): qty 1000

## 2021-05-14 MED ORDER — MIDODRINE HCL 5 MG PO TABS
5.0000 mg | ORAL_TABLET | Freq: Three times a day (TID) | ORAL | Status: DC
  Administered 2021-05-14 – 2021-05-15 (×4): 5 mg via ORAL
  Filled 2021-05-14 (×4): qty 1

## 2021-05-14 MED ORDER — ENSURE ENLIVE PO LIQD
237.0000 mL | Freq: Two times a day (BID) | ORAL | Status: DC
  Administered 2021-05-14 – 2021-06-05 (×31): 237 mL via ORAL

## 2021-05-14 NOTE — Evaluation (Signed)
Occupational Therapy Evaluation Patient Details Name: Ronald [name] MRN: [id number] DOB: 1946/04/11 Today's Date: 05/14/2021   History of Present Illness 75 y/o male who presented on 11/23 w/ SOB with acute respiratory failure requiring intubation in ED, unsuccessful extubation 11/25. Pt successfully extubated on 12/1 with some difficulty tolerating BiPAP. PMHx: COPD, tracheal stenosis, chronic respiratory failure on 2L, CAD, DM II, HTN, HLD   Clinical Impression   PTA patient reports independent with ADLs, ex-wife assisting with IADLs (meds, meals), but pt reports driving--questionable historian due to cognition and confusion. Admitted for above and limited by problem list below, including impaired cognition, generalized weakness, decreased activity tolerance, B UE edema and decreased [place], and impaired balance.  He currently requires up to total assist for LB ADLs and min assist for UB ADLs, min assist for bed mobility.  Pt fatigues easily and requires frequent redirection, cueing for sequencing, and Increased time for all tasks due to cognition. His BP is soft but stable and HR elevated 110-130 throughout session, RN aware. Pt will benefit from continued OT services while admitted and after dc at SNF level to optimize return to PLOF with ADLs and mobility.      Recommendations for follow up therapy are one component of a multi-disciplinary discharge planning process, led by the attending physician.  Recommendations may be updated based on patient status, additional functional criteria and insurance authorization.   Follow Up Recommendations  Skilled nursing-short term rehab (<3 hours/day)    Assistance Recommended at Discharge Frequent or constant Supervision/Assistance  Functional Status Assessment  Patient has had a recent decline in their functional status and demonstrates the ability to make significant improvements in function in a reasonable and predictable amount of time.  Equipment  Recommendations  Other (comment) (defer to next venue)    Recommendations for Other Services       Precautions / Restrictions Precautions Precautions: Fall Precaution Comments: cortrak, flexiseal Restrictions Weight Bearing Restrictions: No      Mobility Bed Mobility Overal bed mobility: Needs Assistance Bed Mobility: Supine to Sit;Sit to Supine     Supine to sit: Min assist;HOB elevated Sit to supine: Min assist;HOB elevated   General bed mobility comments: requires assist for R LE mgmt to/from EOB and trunk support to ascend, increased time and cueing to sequence/complete task    Transfers                   General transfer comment: deferred      Balance Overall balance assessment: Needs assistance Sitting-balance support: No upper extremity supported;Feet supported Sitting balance-Leahy Scale: Fair Sitting balance - Comments: at EOB                                   ADL either performed or assessed with clinical judgement   ADL Overall ADL's : Needs assistance/impaired     Grooming: Set up;Sitting   Upper Body Bathing: Minimal assistance;Sitting   Lower Body Bathing: Maximal assistance;Sitting/lateral leans   Upper Body Dressing : Minimal assistance;Sitting   Lower Body Dressing: Sitting/lateral leans;Total assistance Lower Body Dressing Details (indicate cue type and reason): requires assist for socks   Toilet Transfer Details (indicate cue type and reason): deferred         Functional mobility during ADLs: Minimal assistance;Cueing for safety;Cueing for sequencing General ADL Comments: pt limited weakness, cognition, R LE pain and balance     Vision  Perception     Praxis      Pertinent Vitals/Pain Pain Assessment: Faces Faces Pain Scale: Hurts little more Pain Location: R Leg/hip Pain Descriptors / Indicators: Discomfort;Grimacing;Guarding Pain Intervention(s): Monitored during session;Limited activity  within patient's tolerance;Repositioned     Hand Dominance Right   Extremity/Trunk Assessment Upper Extremity Assessment Upper Extremity Assessment: Generalized weakness;RUE deficits/detail;LUE deficits/detail RUE Deficits / Details: edema and decreased [place] RUE Sensation: WNL RUE Coordination: decreased fine motor LUE Deficits / Details: edema and decreased [place] LUE Sensation: WNL LUE Coordination: decreased fine motor   Lower Extremity Assessment Lower Extremity Assessment: Defer to PT evaluation       Communication Communication Communication: No difficulties   Cognition Arousal/Alertness: Awake/alert Behavior During Therapy: WFL for tasks assessed/performed Overall Cognitive Status: Impaired/Different from baseline Area of Impairment: Orientation;Memory;Following commands;Awareness;Attention;Safety/judgement;Problem solving                 Orientation Level: Disoriented to;Place;Time;Situation Current Attention Level: Sustained Memory: Decreased short-term memory Following Commands: Follows one step commands inconsistently;Follows one step commands with increased time Safety/Judgement: Decreased awareness of safety;Decreased awareness of deficits Awareness: Intellectual Problem Solving: Slow processing;Difficulty sequencing;Requires verbal cues;Decreased initiation General Comments: oriented to self, month (reports 2023 and he is in [place]), follows 1 step commands with increased time and mulitmodal cueing at times.     General Comments  BP soft but stable at 80s/50s (RN aware), HR ranged from 110s-130s, 1L [place] VSS    Exercises     Shoulder Instructions      Home Living Family/patient expects to be discharged to:: Private residence Living Arrangements: Other relatives (ex wife) Available Help at Discharge: Family;Available PRN/intermittently Type of Home: House       Home Layout: Two level;Bed/bath upstairs     Bathroom Shower/Tub: Animal nutritionist: Standard     Home Equipment: None   Additional Comments: Pt is a poor historian and uncertain of accuracy      Prior Functioning/Environment Prior Level of Function : Needs assist;Patient poor historian/Family not available  Cognitive Assist : ADLs (cognitive)   ADLs (Cognitive): Intermittent cues Physical Assist : ADLs (physical)   ADLs (physical): IADLs   ADLs Comments: ex wife assists with meals and meds, limited cleaning; independent ADLs and reports he drives        OT Problem List: Decreased strength;Decreased activity tolerance;Impaired balance (sitting and/or standing);Decreased coordination;Decreased cognition;Decreased safety awareness;Decreased knowledge of use of DME or AE;Decreased knowledge of precautions;Pain;Increased edema;Impaired UE functional use;Obesity;Cardiopulmonary status limiting activity      OT Treatment/Interventions: Self-care/ADL training;Therapeutic exercise;DME and/or AE instruction;Therapeutic activities;Balance training;Patient/family education;Cognitive remediation/compensation    OT Goals(Current goals can be found in the care plan section) Acute Rehab OT Goals Patient Stated Goal: to eat lunch OT Goal Formulation: With patient Time For Goal Achievement: 05/28/21 Potential to Achieve Goals: Fair  OT Frequency: Min 2X/week   Barriers to D/C:            Co-evaluation              AM-PAC OT "6 Clicks" Daily Activity     Outcome Measure Help from another person eating meals?: A Little Help from another person taking care of personal grooming?: A Little Help from another person toileting, which includes using toliet, bedpan, or urinal?: A Lot Help from another person bathing (including washing, rinsing, drying)?: A Lot Help from another person to put on and taking off regular upper body clothing?: A Little Help from another person to put  on and taking off regular lower body clothing?: Total 6 Click Score: 14    End of Session Equipment Utilized During Treatment: Oxygen Nurse Communication: Mobility status  Activity Tolerance: Patient tolerated treatment well Patient left: with call bell/phone within reach;in bed;with bed alarm set  OT Visit Diagnosis: Other abnormalities of gait and mobility (R26.89);Muscle weakness (generalized) (M62.81);Pain;Other symptoms and signs involving cognitive function Pain - Right/Left: Right Pain - part of body: Hip;Leg                Time: 7737-3668 OT Time Calculation (min): 30 min Charges:  OT General Charges $OT Visit: 1 Visit OT Evaluation $OT Eval Moderate Complexity: 1 Mod OT Treatments $Self Care/Home Management : 8-22 mins  Jolaine Artist, OT Acute Rehabilitation Services Pager [phone] Office [phone]   Delight Stare 05/14/2021, 2:37 PM
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
## 2021-05-14 NOTE — Progress Notes (Signed)
Nutrition Follow-up  DOCUMENTATION CODES:   Non-severe (moderate) malnutrition in context of acute illness/injury  INTERVENTION:   Nocturnal TF via Cortrak: Osmolite 1.5 at 83 ml/h x 12 hours at night (6pm-6am), 1 L total per night Prosource TF 45 ml QID  Provides 1660 kcal (75% of estimated needs), 107 gm protein (89% of estimated needs), 762 ml free water daily.  Continue free water flushes 100 ml q 4 hours for total 1362 ml free water daily.  Continue MVI with minerals daily via tube.  Add Ensure Enlive po BID, each supplement provides 350 kcal and 20 grams of protein  NUTRITION DIAGNOSIS:   Moderate Malnutrition related to acute illness (influenza A) as evidenced by mild muscle depletion, mild fat depletion.  Ongoing  GOAL:   Patient will meet greater than or equal to 90% of their needs  Currently met with TF  MONITOR:   TF tolerance, Labs, Diet advancement  REASON FOR ASSESSMENT:   Consult Enteral/tube feeding initiation and management  ASSESSMENT:   75 yo male admitted from [facility] with acute respiratory failure, influenza A. PMH includes COPD, CAD, ulcerative colitis, HTN, asthma, DM-2, TIA, GERD, HLD.  Discussed patient in ICU rounds and with RN today. SLP advanced diet to dysphagia 2 with thin liquids this morning. Patient has no appetite; he ate none of his lunch today.  Cortrak in place. Tip is at the LOT. Currently receiving Osmolite 1.2 at 75 ml/h, tolerating well.  Receiving free water flushes 100 ml every 4 hours. Will change TF to nocturnal feedings.  Labs reviewed. K 5.4 CBG: [phone]  Medications reviewed and include Colace, Novolog, Semglee, Solumedrol, Remeron, Protonix, Miralax, Precedex.  I/O +4.7 L since admission. Admission weight 85.4 kg Current weight 84.1 kg  Diet Order:   Diet Order             DIET DYS 2 Room service appropriate? Yes; Fluid consistency: Thin  Diet effective now                   EDUCATION NEEDS:    No education needs have been identified at this time  Skin:  Skin Assessment: Reviewed RN Assessment (skin tear to scrotum)  Last BM:  12/5 rectal tube  Height:   Ht Readings from Last 1 Encounters:  04/27/21 5' 9"  (1.753 m)    Weight:   Wt Readings from Last 1 Encounters:  05/14/21 84.1 kg    BMI:  Body mass index is 27.38 kg/m.  Estimated Nutritional Needs:   Kcal:  2200-2400  Protein:  120-130 gm  Fluid:  2.1-2.3 L    Lucas Mallow, RD, LDN, CNSC Please refer to Amion for contact information.

## 2021-05-14 NOTE — Progress Notes (Signed)
NAME:  Ronald [name], MRN:  [id number], DOB:  1946/03/25, LOS: 32 ADMISSION DATE:  05/01/2021, CONSULTATION DATE:  05/01/2021 REFERRING MD:  Dr. Pearline Cables, CHIEF COMPLAINT:  Respiratory Distress    History of Present Illness:  [name] is a 75 year old male with PMHx of COPD GOLD Stage 3-4, tracheal stenosis s/p trach in 2018, CAD s/p DESx2 to LAD in 2015, hypertension, hyperlipidemia, chronic respiratory failure on 2L, HFpEF, type II diabetes presenting with shortness of breath. He was given steroids and duonebs by EMS and placed on CPAP without significant improvement Patient intubated in the ED for airway protection, given steroids, broad spectrum antibiotics and started on levophed for hypotension and admitted to [facility].    Patient had similar presentation for SOB to ED on 11/19 and recommended for admission for COPD exacerbation but declined admission. He was given prednisone and doxycycline on discharge.   Pertinent  Medical History    Anxiety     Asthma     Chronic lower back pain     COPD (chronic obstructive pulmonary disease) (HCC)     Coronary artery disease      a. NSTEMI 05/2014 s/p DESx2 to LAD at [facility].   Depression     Educated about COVID-19 virus infection 03/06/2020   GERD (gastroesophageal reflux disease)     High cholesterol     Hypertension     NSTEMI (non-ST elevated myocardial infarction) ([place]) 05/2014    with stent placement   Sleep apnea     Stroke ([place]) 2017    anyeusym    TIA (transient ischemic attack)      "they say I've had some mini strokes; don't know when"; denies residual on 06/22/2014)   Type II diabetes mellitus ([place])     Ulcerative colitis ([place])    Significant Hospital Events: Including procedures, antibiotic start and stop dates in addition to other pertinent events   11/23 intubated resp distress and severe air trapped; transferred from [facility] to [facility] 11/24 Continued on full vent support for acute respiratory failure and amiodarone gtt and  heparin gtt for Afib with RVR  11/25 Extubated. Re-intubated that night due to tachypnea 11/30 attempt at weaning 12/1 extubated 11/30, difficulty tolerating BiPAP, significant agitation 12/3 Remains on Precedex due to agitation 12/5 intermittent BiPAP nightly; continuing to require precedex for agitation  Interim History / Subjective:  No significant overnight events; tolerating BiPAP This morning, awake and appropriately interactive. Denies any acute concerns. Remains on precedex gtt.    Objective   Blood pressure 129/67, pulse 64, temperature 97.9 F (36.6 C), temperature source Oral, resp. rate 19, weight 84.1 kg, SpO2 100 %.    Vent Mode: PCV;BIPAP FiO2 (%):  [28 %-50 %] 30 % Set Rate:  [12 bmp] 12 bmp   Intake/Output Summary (Last 24 hours) at 05/14/2021 0659 Last data filed at 05/14/2021 0600 Gross per 24 hour  Intake 2463.9 ml  Output 1385 ml  Net 1078.9 ml   Filed Weights   05/12/21 0335 05/13/21 0345 05/14/21 0405  Weight: 86.2 kg 84.5 kg 84.1 kg   Physical Exam: General: Chronically ill-appearing elderly male, no acute distress  HENT: [place], AT, OP clear, MMM Eyes: EOMI, no scleral icterus Respiratory: Diminished breath sounds bilaterally.  No crackles, wheezing or rales Cardiovascular: RRR, -M/R/G, no JVD GI: BS+, soft, nontender Extremities: significant bilat upper extremity edema Neuro: Awake, alert and appropriately interactive; no focal deficits  GU: Foley in place  Resolved Hospital Problem list  Shock liver AKI  Assessment & Plan:  Acute metabolic encephalopathy Agitation requiring sedation -PAD protocol for RASS goal -1. Wean Precedex - Continue seroquel bid, increase dosing prn for agitation to wean off precedex - Continue home xanax for anxiety  Acute on chronic hypoxic and hypercapnic respiratory failure Chronic obstructive pulmonary disease History of tracheal stenosis -BiPAP nightly -oxygen supplementation as needed, goal SpO2  88-92% -Continue to monitor for high respiratory risk for intubation -Continue bronchodilators: Cont Pulmicort, Duonebs, and brovana - Weaning steroids  Atrial flutter with RVR - rate controlled  - Continue metoprolol and amiodarone and IMDUR -Continue cardiac monitoring  -Continue Eliquis bid   Hyperkalemia - EKG + Lokelma 5mg  once - Repeat BMP in PM  History of diastolic heart failure Hypertension Echocardiogram with normal function -Cautious diuresis as tolerated  Normocytic anemia Hb was trending down over several days; stable this AM. Likely in setting of acute illness. No obvious signs of bleeding  - Trend CBC  - Transfuse goal Hb >8  Type 2 diabetes -Increase Semglee to 30U + SSI  - CBG monitoring with goal 140-180  Urinary retention History of overactive bladder however noted to be requiring catheterization. Home cardura resumed.  - Foley in place; voiding trial today  Best Practice (right click and "Reselect all SmartList Selections" daily)   Diet/type: tubefeeds DVT prophylaxis: DOAC GI prophylaxis: PPI Lines: N/A Foley:  Yes, and it is no longer needed Code Status:  full code Last date of multidisciplinary goals of care discussion [family to be updated 12/6]   Ronald Heck, MD Internal Medicine, PGY-3 05/14/21 6:59 AM Pager # [phone]

## 2021-05-14 NOTE — Progress Notes (Signed)
[facility] Progress Note Patient Name: Ronald [name] DOB: 1946/04/04 MRN: [id number]   Date of Service  05/14/2021  HPI/Events of Note  Sinus tachycardia - HR = 129. BP = 91/67 with MAP = 75. Nursing reluctant to given ordered Metoprolol PO. Albumin = 2.0.   eICU Interventions  Plan: 25% Albumin 12.5 gm IV now. Would give Metoprolol PO as ordered after the above Albumin IV.      Intervention Category Major Interventions: Hypotension - evaluation and management;Arrhythmia - evaluation and management  Dyneisha Murchison [place] 05/14/2021, 10:49 PM

## 2021-05-15 ENCOUNTER — Inpatient Hospital Stay (HOSPITAL_COMMUNITY): Payer: Medicare HMO

## 2021-05-15 DIAGNOSIS — J9602 Acute respiratory failure with hypercapnia: Secondary | ICD-10-CM | POA: Diagnosis not present

## 2021-05-15 LAB — CBC
HCT: 27.4 % — ABNORMAL LOW (ref 39.0–52.0)
Hemoglobin: 8.9 g/dL — ABNORMAL LOW (ref 13.0–17.0)
MCH: 29.2 pg (ref 26.0–34.0)
MCHC: 32.5 g/dL (ref 30.0–36.0)
MCV: 89.8 fL (ref 80.0–100.0)
Platelets: 275 10*3/uL (ref 150–400)
RBC: 3.05 MIL/uL — ABNORMAL LOW (ref 4.22–5.81)
RDW: 13.5 % (ref 11.5–15.5)
WBC: 11.4 10*3/uL — ABNORMAL HIGH (ref 4.0–10.5)
nRBC: 0 % (ref 0.0–0.2)

## 2021-05-15 LAB — COMPREHENSIVE METABOLIC PANEL
ALT: 24 U/L (ref 0–44)
AST: 25 U/L (ref 15–41)
Albumin: 2.3 g/dL — ABNORMAL LOW (ref 3.5–5.0)
Alkaline Phosphatase: 64 U/L (ref 38–126)
Anion gap: 6 (ref 5–15)
BUN: 37 mg/dL — ABNORMAL HIGH (ref 8–23)
CO2: 35 mmol/L — ABNORMAL HIGH (ref 22–32)
Calcium: 8.2 mg/dL — ABNORMAL LOW (ref 8.9–10.3)
Chloride: 95 mmol/L — ABNORMAL LOW (ref 98–111)
Creatinine, Ser: 0.92 mg/dL (ref 0.61–1.24)
GFR, Estimated: >60 mL/min (ref >60)
Glucose, Bld: 248 mg/dL — ABNORMAL HIGH (ref 70–99)
Potassium: 5.1 mmol/L (ref 3.5–5.1)
Sodium: 136 mmol/L (ref 135–145)
Total Bilirubin: 1 mg/dL (ref 0.3–1.2)
Total Protein: 4.9 g/dL — ABNORMAL LOW (ref 6.5–8.1)

## 2021-05-15 LAB — GLUCOSE, CAPILLARY
Glucose-Capillary: 121 mg/dL — ABNORMAL HIGH (ref 70–99)
Glucose-Capillary: 146 mg/dL — ABNORMAL HIGH (ref 70–99)
Glucose-Capillary: 277 mg/dL — ABNORMAL HIGH (ref 70–99)
Glucose-Capillary: 289 mg/dL — ABNORMAL HIGH (ref 70–99)
Glucose-Capillary: 91 mg/dL (ref 70–99)
Glucose-Capillary: 93 mg/dL (ref 70–99)

## 2021-05-15 MED ORDER — DEXMEDETOMIDINE HCL IN NACL 400 MCG/100ML IV SOLN
0.0000 ug/kg/h | INTRAVENOUS | Status: DC
  Administered 2021-05-15: 0.4 ug/kg/h via INTRAVENOUS
  Administered 2021-05-16: 0.9 ug/kg/h via INTRAVENOUS
  Filled 2021-05-15 (×2): qty 100

## 2021-05-15 MED ORDER — AMIODARONE HCL 200 MG PO TABS: 100.0000 mg | ORAL_TABLET | Freq: Every day | ORAL | Status: DC

## 2021-05-15 MED ORDER — QUETIAPINE FUMARATE 100 MG PO TABS
100.0000 mg | ORAL_TABLET | Freq: Two times a day (BID) | ORAL | Status: DC
  Administered 2021-05-15 – 2021-05-23 (×17): 100 mg
  Filled 2021-05-15 (×17): qty 1

## 2021-05-15 MED ORDER — CARVEDILOL 25 MG PO TABS
25.0000 mg | ORAL_TABLET | Freq: Two times a day (BID) | ORAL | Status: DC
  Administered 2021-05-15 (×2): 25 mg via ORAL
  Filled 2021-05-15 (×2): qty 1

## 2021-05-15 MED ORDER — INSULIN ASPART 100 UNIT/ML IJ SOLN
5.0000 [IU] | Freq: Three times a day (TID) | INTRAMUSCULAR | Status: DC
  Administered 2021-05-15 (×2): 5 [IU] via SUBCUTANEOUS

## 2021-05-15 MED ORDER — LEVALBUTEROL HCL 1.25 MG/0.5ML IN NEBU
1.2500 mg | INHALATION_SOLUTION | Freq: Four times a day (QID) | RESPIRATORY_TRACT | Status: DC
  Administered 2021-05-15: 1.25 mg via RESPIRATORY_TRACT
  Filled 2021-05-15 (×3): qty 0.5

## 2021-05-15 MED ORDER — ALPRAZOLAM 0.5 MG PO TABS
1.0000 mg | ORAL_TABLET | Freq: Two times a day (BID) | ORAL | Status: DC | PRN
  Administered 2021-05-15 – 2021-05-23 (×4): 1 mg
  Filled 2021-05-15 (×5): qty 2

## 2021-05-15 MED ORDER — FENTANYL CITRATE (PF) 100 MCG/2ML IJ SOLN: 25.0000 ug | INTRAMUSCULAR | Status: DC | PRN

## 2021-05-15 MED ORDER — DIGOXIN 125 MCG PO TABS
0.1250 mg | ORAL_TABLET | Freq: Once | ORAL | Status: AC
  Administered 2021-05-15: 0.125 mg via ORAL
  Filled 2021-05-15: qty 1

## 2021-05-15 MED ORDER — DOCUSATE SODIUM 50 MG/5ML PO LIQD
100.0000 mg | Freq: Two times a day (BID) | ORAL | Status: DC
  Administered 2021-05-15 – 2021-06-06 (×28): 100 mg
  Filled 2021-05-15 (×35): qty 10

## 2021-05-15 MED ORDER — POLYETHYLENE GLYCOL 3350 17 G PO PACK
17.0000 g | PACK | Freq: Every day | ORAL | Status: DC
  Administered 2021-05-21 – 2021-06-04 (×6): 17 g
  Filled 2021-05-15 (×13): qty 1

## 2021-05-15 MED ORDER — ALBUMIN HUMAN 25 % IV SOLN
12.5000 g | Freq: Once | INTRAVENOUS | Status: AC
  Administered 2021-05-15: 12.5 g via INTRAVENOUS
  Filled 2021-05-15: qty 50

## 2021-05-15 MED ORDER — LEVALBUTEROL HCL 1.25 MG/0.5ML IN NEBU
1.2500 mg | INHALATION_SOLUTION | Freq: Four times a day (QID) | RESPIRATORY_TRACT | Status: DC
  Administered 2021-05-16 – 2021-05-19 (×12): 1.25 mg via RESPIRATORY_TRACT
  Filled 2021-05-15 (×17): qty 0.5

## 2021-05-15 MED ORDER — DIGOXIN 125 MCG PO TABS
0.2500 mg | ORAL_TABLET | Freq: Every day | ORAL | Status: DC
Start: 2021-05-15 — End: 2021-05-22
  Administered 2021-05-15 – 2021-05-22 (×8): 0.25 mg
  Filled 2021-05-15 (×4): qty 2
  Filled 2021-05-15 (×2): qty 1
  Filled 2021-05-15 (×2): qty 2

## 2021-05-15 MED ORDER — IPRATROPIUM BROMIDE 0.02 % IN SOLN
0.5000 mg | Freq: Four times a day (QID) | RESPIRATORY_TRACT | Status: DC
Start: 2021-05-15 — End: 2021-05-19
  Administered 2021-05-15 – 2021-05-19 (×16): 0.5 mg via RESPIRATORY_TRACT
  Filled 2021-05-15 (×19): qty 2.5

## 2021-05-15 NOTE — Progress Notes (Signed)
Inpatient Diabetes Program Recommendations  AACE/ADA: New Consensus Statement on Inpatient Glycemic Control (2015)  Target Ranges:  Prepandial:   less than 140 mg/dL      Peak postprandial:   less than 180 mg/dL (1-2 hours)      Critically ill patients:  140 - 180 mg/dL   Lab Results  Component Value Date   GLUCAP 91 05/15/2021   HGBA1C 7.5 (H) 05/02/2021    Review of Glycemic Control  Latest Reference Range & Units 05/14/21 19:41 05/14/21 23:39 05/15/21 03:36 05/15/21 08:01 05/15/21 12:04  Glucose-Capillary 70 - 99 mg/dL 104 (H) 243 (H) 277 (H) 146 (H) 91  (H): Data is abnormally high   Inpatient Diabetes Program Recommendations:    Receiving tube feeds from 6pm- 6am.  Might consider Novolog 2 units tube feed coverage Q4H during that time while receiving feeds.    Will continue to follow while inpatient.  Thank you, Reche Dixon, RN, BSN Diabetes Coordinator Inpatient Diabetes Program [phone] (team pager from 8a-5p)

## 2021-05-15 NOTE — Procedures (Signed)
FEES completed by Herbie Baltimore, MA, CCC-SLP on 05/14/21, but note populated by [place] I. Hardin Negus, MS, CCC-SLP  Objective Swallowing Evaluation: Type of Study: FEES-Fiberoptic Endoscopic Evaluation of Swallow   Patient Details  Name: Ronald [name] MRN: [id number] Date of Birth: 11/09/1945  Today's Date: 05/15/2021 Time: SLP Start Time (ACUTE ONLY): 1010 -SLP Stop Time (ACUTE ONLY): 1050  SLP Time Calculation (min) (ACUTE ONLY): 40 min   Past Medical History:  Past Medical History:  Diagnosis Date   Anxiety    Asthma    Chronic lower back pain    COPD (chronic obstructive pulmonary disease) ([place])    Coronary artery disease    a. NSTEMI 05/2014 s/p DESx2 to LAD at [facility].   Depression    Educated about COVID-19 virus infection 03/06/2020   GERD (gastroesophageal reflux disease)    High cholesterol    Hypertension    NSTEMI (non-ST elevated myocardial infarction) ([place]) 05/2014   with stent placement   Sleep apnea    Stroke ([place]) 2017   anyeusym    TIA (transient ischemic attack)    "they say I've had some mini strokes; don't know when"; denies residual on 06/22/2014)   Type II diabetes mellitus ([place])    Ulcerative colitis ([place])    Past Surgical History:  Past Surgical History:  Procedure Laterality Date   APPENDECTOMY     BIOPSY  07/20/2020   Procedure: BIOPSY;  Surgeon: Harvel Quale, MD;  [facility];  Service: Gastroenterology;;   CARDIAC CATHETERIZATION  [phone]   CHOLECYSTECTOMY     COLONOSCOPY WITH PROPOFOL N/A 07/20/2020   Procedure: COLONOSCOPY WITH PROPOFOL;  Surgeon: Harvel Quale, MD;  Location: [facility];  Service: Gastroenterology;  Laterality: N/A;  1:15   CORONARY ANGIOPLASTY WITH STENT PLACEMENT  05/2014   "2"   ESOPHAGEAL DILATION N/A 07/20/2020   Procedure: ESOPHAGEAL DILATION;  Surgeon: Harvel Quale, MD;  Location: [facility];  Service: Gastroenterology;  Laterality: N/A;    ESOPHAGOGASTRODUODENOSCOPY (EGD) WITH PROPOFOL N/A 07/20/2020   Procedure: ESOPHAGOGASTRODUODENOSCOPY (EGD) WITH PROPOFOL;  Surgeon: Harvel Quale, MD;  [facility];  Service: Gastroenterology;  Laterality: N/A;   LEFT HEART CATH AND CORONARY ANGIOGRAPHY N/A 05/25/2020   Procedure: LEFT HEART CATH AND CORONARY ANGIOGRAPHY;  Surgeon: Martinique, Peter M, MD;  Location: [place] CV LAB;  Service: Cardiovascular;  Laterality: N/A;   POLYPECTOMY  07/20/2020   Procedure: POLYPECTOMY INTESTINAL;  Surgeon: Harvel Quale, MD;  Location: [facility];  Service: Gastroenterology;;   TUMOR EXCISION Right ~ 1999   "side of my upper head"   HPI: This 75 year old Caucasian male transferred from [place], where he presented with acute worsening of wheezing and dyspnea.  He was recently seen there on 04/27/2021 but refused admission and was sent home with prescription for steroids and antibiotics.  He returned today with worsening of symptoms.  He was intubated in there ER at [facility] for worsening hypercapnea associated with altered mental status.  He was found to be positive for influenza A.  Patient with known underlying tracheal stenosis.  Most recent chest xray was showing new infiltrate in the mid to lower left lung and persistent atelectasis versus consolidatin in the left lung base.   Subjective: He was seen sitting upright in bed.    Recommendations for follow up therapy are one component of a multi-disciplinary discharge planning process, led by the attending physician.  Recommendations may be  updated based on patient status, additional functional criteria and insurance authorization.  Assessment / Plan / Recommendation  Clinical Impressions 05/14/2021  Clinical Impression Pt demosntrates a mild dyspahgia, primairly impacted by bry pharyngeal mucosa leading to clinging residue on pharyngeal tissue. After several trials, this improved. He did ahve some more persistent  residue in valleculae with solids. Laryngeal strength and ROM very good however, expect improvement with better pharyngeal hydration. Pt did ahve one instance of strace sensed aspsiration of thin liquids when drinking a liquid wash due to a drip falling uncontrolled into larynx, pt immediate ejected with a hard cough. Airway protection is excellent. Recommend thin liquids and soft (dys 2) chopped solids given poor dentition. Meds can be attempted whole with water.  SLP Visit Diagnosis Dysphagia, unspecified (R13.10)  Attention and concentration deficit following --  Frontal lobe and executive function deficit following --  Impact on safety and function Mild aspiration risk      Treatment Recommendations 05/14/2021  Treatment Recommendations Therapy as outlined in treatment plan below     Prognosis 05/14/2021  Prognosis for Safe Diet Advancement Good  Barriers to Reach Goals --  Barriers/Prognosis Comment --    Diet Recommendations 05/14/2021  SLP Diet Recommendations Dysphagia 2 (Fine chop) solids;Thin liquid  Liquid Administration via Cup;Straw  Medication Administration Whole meds with liquid  Compensations Slow rate;Small sips/bites  Postural Changes Remain semi-upright after after feeds/meals (Comment);Seated upright at 90 degrees      Other Recommendations 05/14/2021  Recommended Consults --  Oral Care Recommendations Oral care BID  Other Recommendations --  Follow Up Recommendations No SLP follow up  Assistance recommended at discharge --  Functional Status Assessment --    Frequency and Duration  05/14/2021  Speech Therapy Frequency (ACUTE ONLY) min 2x/week  Treatment Duration 2 weeks      Oral Phase 05/14/2021  Oral Phase WFL  Oral - Pudding Teaspoon --  Oral - Pudding Cup --  Oral - Honey Teaspoon --  Oral - Honey Cup --  Oral - Nectar Teaspoon --  Oral - Nectar Cup --  Oral - Nectar Straw --  Oral - Thin Teaspoon --  Oral - Thin Cup --  Oral - Thin Straw --  Oral  - Puree --  Oral - Mech Soft --  Oral - Regular --  Oral - Multi-Consistency --  Oral - Pill --  Oral Phase - Comment --    Pharyngeal Phase 05/14/2021  Pharyngeal Phase Impaired  Pharyngeal- Pudding Teaspoon --  Pharyngeal --  Pharyngeal- Pudding Cup --  Pharyngeal --  Pharyngeal- Honey Teaspoon --  Pharyngeal --  Pharyngeal- Honey Cup --  Pharyngeal --  Pharyngeal- Nectar Teaspoon WFL  Pharyngeal --  Pharyngeal- Nectar Cup --  Pharyngeal --  Pharyngeal- Nectar Straw WFL  Pharyngeal --  Pharyngeal- Thin Teaspoon WFL  Pharyngeal --  Pharyngeal- Thin Cup --  Pharyngeal --  Pharyngeal- Thin Straw Penetration/Aspiration before swallow  Pharyngeal Material enters airway, CONTACTS cords and then ejected out  Pharyngeal- Puree WFL  Pharyngeal --  Pharyngeal- Mechanical Soft Pharyngeal residue - valleculae  Pharyngeal --  Pharyngeal- Regular --  Pharyngeal --  Pharyngeal- Multi-consistency --  Pharyngeal --  Pharyngeal- Pill --  Pharyngeal --  Pharyngeal Comment --

## 2021-05-15 NOTE — Progress Notes (Signed)
Speech Language Pathology Treatment: Dysphagia  Patient Details Name: Ronald [name] MRN: [id number] DOB: [birth year] Today's Date: 05/15/2021 Time: 9470-9628 SLP Time Calculation (min) (ACUTE ONLY): 12 min  Assessment / Plan / Recommendation Clinical Impression  Pt was seen for dysphagia treatment. He was alert and cooperative with RR <30 for most of the session. Pt was seen during breakfast which included scrambled eggs and ground sausage. Pt demonstrated mildly prolonged mastication which pt attributed to his edentulous status. Mild residue was noted on the right surface of the tongue, and was cleared with liquid washes. A single cough was noted once following use of a liquid wash when pt's RR was elevated to 32. SLP suspects possible aspiration of liquid, secondary to impaired coordination of swallowing with respiration. However, pt's cough was shown to be effective during instrumental assessment. Pt's swallow mechanism overall appears to be functional for his current diet of dysphagia 2 and thin liquids, and further skilled SLP services are not clinically indicated at this time for swallowing.   HPI HPI: This 75 year old Caucasian male transferred from [facility], where he presented with acute worsening of wheezing and dyspnea.  He was recently seen there on 04/27/2021 but refused admission and was sent home with prescription for steroids and antibiotics.  He returned today with worsening of symptoms.  He was intubated in there ER at [facility] for worsening hypercapnea associated with altered mental status.  He was found to be positive for influenza A.  Patient with known underlying tracheal stenosis.  Most recent chest xray was showing new infiltrate in the mid to lower left lung and persistent atelectasis versus consolidatin in the left lung base.      SLP Plan  All goals met;Discharge SLP treatment due to (comment)      Recommendations for follow up therapy are one component of a  multi-disciplinary discharge planning process, led by the attending physician.  Recommendations may be updated based on patient status, additional functional criteria and insurance authorization.    Recommendations  Diet recommendations: Dysphagia 2 (fine chop);Thin liquid Liquids provided via: Cup;Straw Medication Administration: Whole meds with liquid Supervision: Staff to assist with self feeding Compensations: Slow rate;Small sips/bites;Follow solids with liquid Postural Changes and/or Swallow Maneuvers: Seated upright 90 degrees                Oral Care Recommendations: Oral care BID Follow Up Recommendations: Skilled nursing-short term rehab (<3 hours/day) Assistance recommended at discharge: Intermittent Supervision/Assistance SLP Visit Diagnosis: Dysphagia, unspecified (R13.10) Plan: All goals met;Discharge SLP treatment due to (comment)       [place] I. Hardin Negus, [place], [place] Office number [phone] Pager [place]  05/15/2021, 9:28 AM

## 2021-05-15 NOTE — Progress Notes (Addendum)
NAME:  Ronald [name], MRN:  [id number], DOB:  [birth year], LOS: 33 ADMISSION DATE:  05/01/2021, CONSULTATION DATE:  05/01/2021 REFERRING MD:  Dr. Pearline [name], CHIEF COMPLAINT:  Respiratory Distress    History of Present Illness:  [name] is a 75 year old male with PMHx of COPD GOLD Stage 3-4, tracheal stenosis s/p trach in 2018, CAD s/p DESx2 to LAD in 2015, hypertension, hyperlipidemia, chronic respiratory failure on 2L, HFpEF, type II diabetes presenting with shortness of breath. He was given steroids and duonebs by EMS and placed on CPAP without significant improvement Patient intubated in the ED for airway protection, given steroids, broad spectrum antibiotics and started on levophed for hypotension and admitted to [facility].    Patient had similar presentation for SOB to ED on 11/19 and recommended for admission for COPD exacerbation but declined admission. He was given prednisone and doxycycline on discharge.   Pertinent  Medical History    Anxiety     Asthma     Chronic lower back pain     COPD (chronic obstructive pulmonary disease) (H[facility])     Coronary artery disease      a. NSTEMI 05/2014 s/p DESx2 to LAD at [facility].   Depression     Educated about COVID-19 virus infection 03/06/2020   GERD (gastroesophageal reflux disease)     High cholesterol     Hypertension     NSTEMI (non-ST elevated myocardial infarction) ([place]) 05/2014    with stent placement   Sleep apnea     Stroke ([place]) 2017    anyeusym    TIA (transient ischemic attack)      "they say I've had some mini strokes; don't know when"; denies residual on 06/22/2014)   Type II diabetes mellitus ([place])     Ulcerative colitis ([place])    Significant Hospital Events: Including procedures, antibiotic start and stop dates in addition to other pertinent events   11/23 intubated resp distress and severe air trapped; transferred from [facility] to [facility] 11/24 Continued on full vent support for acute respiratory failure and amiodarone gtt and  heparin gtt for Afib with RVR  11/25 Extubated. Re-intubated that night due to tachypnea 11/30 attempt at weaning 12/1 extubated 11/30, difficulty tolerating BiPAP, significant agitation 12/3 Remains on Precedex due to agitation 12/5 intermittent BiPAP nightly; continuing to require precedex for agitation  Interim History / Subjective:  No significant overnight events; tolerating BiPAP This morning, drowsy but arousable. No acute concerns. Remains on precedex gtt.   Objective   Blood pressure 109/67, pulse (!) 128, temperature 97.9 F (36.6 C), temperature source Oral, resp. rate (!) 32, weight 84.3 kg, SpO2 98 %.    Vent Mode: BIPAP;PCV FiO2 (%):  [24 %-30 %] 28 % Set Rate:  [12 bmp] 12 bmp   Intake/Output Summary (Last 24 hours) at 05/15/2021 0903 Last data filed at 05/15/2021 0600 Gross per 24 hour  Intake 1384.4 ml  Output 960 ml  Net 424.4 ml   Filed Weights   05/13/21 0345 05/14/21 0405 05/15/21 0500  Weight: 84.5 kg 84.1 kg 84.3 kg   Physical Exam: General: Chronically ill-appearing elderly male, no acute distress  HENT: [place], AT, OP clear, MMM Eyes: EOMI, no scleral icterus Respiratory: Diminished breath sounds bilaterally.  No crackles, wheezing or rales Cardiovascular: RRR, -M/R/G, no JVD GI: BS+, soft, nontender Extremities: significant bilat upper extremity edema Neuro: Awake, alert and appropriately interactive; no focal deficits   Resolved Hospital Problem list   Shock liver AKI Hyperkalemia  Assessment & Plan:  Acute metabolic encephalopathy Agitation requiring sedation -PAD protocol for RASS goal -1. Wean Precedex - Increase seroquel bid dosing for agitation, to wean off precedex - Increased home xanax for anxiety - Add low dose clonidine tomm if still unable to wean   Acute on chronic hypoxic and hypercapnic respiratory failure Chronic obstructive pulmonary disease History of tracheal stenosis -BiPAP nightly -oxygen supplementation as needed, goal  SpO2 88-92% -Continue to monitor for high respiratory risk for intubation -Continue bronchodilators: Cont Pulmicort, Duonebs, and brovana - Weaning steroids; stop 12/8 - CXR today  Atrial flutter with RVR  Hypotension - Continue amiodarone; adjust metoprolol to coreg for soft BP's - Adding digoxin for rate control - IV albumin -Continue cardiac monitoring  -Continue Eliquis bid   History of diastolic heart failure Hypertension Echocardiogram with normal function -Cautious diuresis as tolerated  Normocytic anemia Hb was trending down over several days; stable this AM. Likely in setting of acute illness. No obvious signs of bleeding  - Trend CBC  - Transfuse goal Hb >8  Type 2 diabetes -Increase Semglee to 30U + SSI  - Add novolog 5U tid w meals - CBG monitoring with goal 140-180  Urinary retention - Continue cardura   Best Practice (right click and "Reselect all SmartList Selections" daily)   Diet/type: tubefeeds DVT prophylaxis: DOAC GI prophylaxis: PPI Lines: N/A Foley:  N/A Code Status:  full code Last date of multidisciplinary goals of care discussion [family updated 12/6]   Ronald Heck, MD Internal Medicine, PGY-3 05/15/21 9:03 AM Pager # [phone]

## 2021-05-15 NOTE — Progress Notes (Signed)
PT Cancellation Note  Patient Details Name: MICIAH COVELLI MRN: [id number] DOB: Aug 18, 1945   Cancelled Treatment:    Reason Eval/Treat Not Completed: Medical issues which prohibited therapy.  RN asked to hold today.  HR at rest in the 130's, low BP's rising and pt slumped over in the bed. 05/15/2021  Ginger Carne., PT Acute Rehabilitation Services [phone]  (pager) [phone]  (office)   Tessie Fass Khadeeja Elden 05/15/2021, 4:36 PM

## 2021-05-16 ENCOUNTER — Inpatient Hospital Stay (HOSPITAL_COMMUNITY): Payer: Medicare HMO

## 2021-05-16 DIAGNOSIS — J9601 Acute respiratory failure with hypoxia: Secondary | ICD-10-CM

## 2021-05-16 DIAGNOSIS — K921 Melena: Secondary | ICD-10-CM | POA: Diagnosis not present

## 2021-05-16 DIAGNOSIS — I4892 Unspecified atrial flutter: Secondary | ICD-10-CM | POA: Diagnosis not present

## 2021-05-16 DIAGNOSIS — J9602 Acute respiratory failure with hypercapnia: Secondary | ICD-10-CM | POA: Diagnosis not present

## 2021-05-16 DIAGNOSIS — D649 Anemia, unspecified: Secondary | ICD-10-CM | POA: Diagnosis not present

## 2021-05-16 LAB — HEMOGLOBIN AND HEMATOCRIT, BLOOD
HCT: 28.4 % — ABNORMAL LOW (ref 39.0–52.0)
Hemoglobin: 9.1 g/dL — ABNORMAL LOW (ref 13.0–17.0)

## 2021-05-16 LAB — POCT I-STAT 7, (LYTES, BLD GAS, ICA,H+H)
Acid-Base Excess: 11 mmol/L — ABNORMAL HIGH (ref 0.0–2.0)
Bicarbonate: 35.6 mmol/L — ABNORMAL HIGH (ref 20.0–28.0)
Calcium, Ion: 1.19 mmol/L (ref 1.15–1.40)
HCT: 30 % — ABNORMAL LOW (ref 39.0–52.0)
Hemoglobin: 10.2 g/dL — ABNORMAL LOW (ref 13.0–17.0)
O2 Saturation: 91 %
Patient temperature: 97.6
Potassium: 4.8 mmol/L (ref 3.5–5.1)
Sodium: 137 mmol/L (ref 135–145)
TCO2: 37 mmol/L — ABNORMAL HIGH (ref 22–32)
pCO2 arterial: 47.7 mmHg (ref 32.0–48.0)
pH, Arterial: 7.479 — ABNORMAL HIGH (ref 7.350–7.450)
pO2, Arterial: 56 mmHg — ABNORMAL LOW (ref 83.0–108.0)

## 2021-05-16 LAB — COMPREHENSIVE METABOLIC PANEL
ALT: 23 U/L (ref 0–44)
AST: 18 U/L (ref 15–41)
Albumin: 2.2 g/dL — ABNORMAL LOW (ref 3.5–5.0)
Alkaline Phosphatase: 57 U/L (ref 38–126)
Anion gap: 6 (ref 5–15)
BUN: 39 mg/dL — ABNORMAL HIGH (ref 8–23)
CO2: 31 mmol/L (ref 22–32)
Calcium: 8.2 mg/dL — ABNORMAL LOW (ref 8.9–10.3)
Chloride: 97 mmol/L — ABNORMAL LOW (ref 98–111)
Creatinine, Ser: 0.93 mg/dL (ref 0.61–1.24)
GFR, Estimated: >60 mL/min (ref >60)
Glucose, Bld: 285 mg/dL — ABNORMAL HIGH (ref 70–99)
Potassium: 5.5 mmol/L — ABNORMAL HIGH (ref 3.5–5.1)
Sodium: 134 mmol/L — ABNORMAL LOW (ref 135–145)
Total Bilirubin: 1 mg/dL (ref 0.3–1.2)
Total Protein: 4.7 g/dL — ABNORMAL LOW (ref 6.5–8.1)

## 2021-05-16 LAB — CBC
HCT: 25.5 % — ABNORMAL LOW (ref 39.0–52.0)
HCT: 26.7 % — ABNORMAL LOW (ref 39.0–52.0)
Hemoglobin: 8 g/dL — ABNORMAL LOW (ref 13.0–17.0)
Hemoglobin: 8.7 g/dL — ABNORMAL LOW (ref 13.0–17.0)
MCH: 28.5 pg (ref 26.0–34.0)
MCH: 29.2 pg (ref 26.0–34.0)
MCHC: 31.4 g/dL (ref 30.0–36.0)
MCHC: 32.6 g/dL (ref 30.0–36.0)
MCV: 90.7 fL (ref 80.0–100.0)
MCV: 89.6 fL (ref 80.0–100.0)
Platelets: 296 10*3/uL (ref 150–400)
Platelets: 328 10*3/uL (ref 150–400)
RBC: 2.81 MIL/uL — ABNORMAL LOW (ref 4.22–5.81)
RBC: 2.98 MIL/uL — ABNORMAL LOW (ref 4.22–5.81)
RDW: 13.6 % (ref 11.5–15.5)
RDW: 14.1 % (ref 11.5–15.5)
WBC: 11.1 10*3/uL — ABNORMAL HIGH (ref 4.0–10.5)
WBC: 13 10*3/uL — ABNORMAL HIGH (ref 4.0–10.5)
nRBC: 0 % (ref 0.0–0.2)
nRBC: 0 % (ref 0.0–0.2)

## 2021-05-16 LAB — BASIC METABOLIC PANEL
Anion gap: 6 (ref 5–15)
BUN: 39 mg/dL — ABNORMAL HIGH (ref 8–23)
CO2: 35 mmol/L — ABNORMAL HIGH (ref 22–32)
Calcium: 8.4 mg/dL — ABNORMAL LOW (ref 8.9–10.3)
Chloride: 98 mmol/L (ref 98–111)
Creatinine, Ser: 1 mg/dL (ref 0.61–1.24)
GFR, Estimated: >60 mL/min (ref >60)
Glucose, Bld: 114 mg/dL — ABNORMAL HIGH (ref 70–99)
Potassium: 5.3 mmol/L — ABNORMAL HIGH (ref 3.5–5.1)
Sodium: 139 mmol/L (ref 135–145)

## 2021-05-16 LAB — GLUCOSE, CAPILLARY
Glucose-Capillary: 106 mg/dL — ABNORMAL HIGH (ref 70–99)
Glucose-Capillary: 114 mg/dL — ABNORMAL HIGH (ref 70–99)
Glucose-Capillary: 158 mg/dL — ABNORMAL HIGH (ref 70–99)
Glucose-Capillary: 223 mg/dL — ABNORMAL HIGH (ref 70–99)
Glucose-Capillary: 278 mg/dL — ABNORMAL HIGH (ref 70–99)
Glucose-Capillary: 82 mg/dL (ref 70–99)
Glucose-Capillary: 91 mg/dL (ref 70–99)

## 2021-05-16 LAB — OCCULT BLOOD X 1 CARD TO LAB, STOOL: Fecal Occult Bld: POSITIVE — AB

## 2021-05-16 MED ORDER — INSULIN GLARGINE-YFGN 100 UNIT/ML ~~LOC~~ SOLN
35.0000 [IU] | Freq: Every day | SUBCUTANEOUS | Status: DC
Start: 2021-05-16 — End: 2021-05-17
  Administered 2021-05-16: 35 [IU] via SUBCUTANEOUS
  Filled 2021-05-16 (×2): qty 0.35

## 2021-05-16 MED ORDER — PANTOPRAZOLE SODIUM 40 MG IV SOLR
40.0000 mg | Freq: Two times a day (BID) | INTRAVENOUS | Status: DC
  Administered 2021-05-16 – 2021-05-24 (×15): 40 mg via INTRAVENOUS
  Filled 2021-05-16 (×16): qty 40

## 2021-05-16 MED ORDER — OSMOLITE 1.5 CAL PO LIQD
1000.0000 mL | ORAL | Status: DC
  Administered 2021-05-16 – 2021-05-29 (×14): 1000 mL
  Filled 2021-05-16 (×12): qty 1000

## 2021-05-16 MED ORDER — MIDODRINE HCL 5 MG PO TABS
5.0000 mg | ORAL_TABLET | Freq: Three times a day (TID) | ORAL | Status: DC
  Administered 2021-05-16 – 2021-05-18 (×6): 5 mg
  Filled 2021-05-16 (×8): qty 1

## 2021-05-16 MED ORDER — INSULIN GLARGINE-YFGN 100 UNIT/ML ~~LOC~~ SOLN
35.0000 [IU] | Freq: Two times a day (BID) | SUBCUTANEOUS | Status: DC
  Filled 2021-05-16 (×2): qty 0.35

## 2021-05-16 MED ORDER — PANTOPRAZOLE SODIUM 40 MG IV SOLR: 40.0000 mg | Freq: Two times a day (BID) | INTRAVENOUS | Status: DC

## 2021-05-16 MED ORDER — METOPROLOL TARTRATE 5 MG/5ML IV SOLN
2.5000 mg | Freq: Once | INTRAVENOUS | Status: AC
  Administered 2021-05-16: 2.5 mg via INTRAVENOUS
  Filled 2021-05-16: qty 5

## 2021-05-16 MED ORDER — SODIUM ZIRCONIUM CYCLOSILICATE 10 G PO PACK
10.0000 g | PACK | Freq: Once | ORAL | Status: AC
  Administered 2021-05-16: 10 g
  Filled 2021-05-16: qty 1

## 2021-05-16 MED ORDER — AMIODARONE HCL 200 MG PO TABS
100.0000 mg | ORAL_TABLET | Freq: Every day | ORAL | Status: DC
  Administered 2021-05-16 – 2021-06-06 (×22): 100 mg
  Filled 2021-05-16 (×22): qty 1

## 2021-05-16 MED ORDER — CARVEDILOL 25 MG PO TABS
25.0000 mg | ORAL_TABLET | Freq: Two times a day (BID) | ORAL | Status: DC
  Administered 2021-05-16 – 2021-05-27 (×21): 25 mg
  Filled 2021-05-16 (×22): qty 1

## 2021-05-16 MED ORDER — INSULIN GLARGINE-YFGN 100 UNIT/ML ~~LOC~~ SOLN
30.0000 [IU] | Freq: Every morning | SUBCUTANEOUS | Status: DC
Start: 2021-05-16 — End: 2021-05-17
  Administered 2021-05-17: 30 [IU] via SUBCUTANEOUS
  Filled 2021-05-16 (×2): qty 0.3

## 2021-05-16 MED ORDER — MELATONIN 3 MG PO TABS
3.0000 mg | ORAL_TABLET | Freq: Every day | ORAL | Status: DC
  Administered 2021-05-16 – 2021-05-22 (×7): 3 mg via ORAL
  Filled 2021-05-16 (×7): qty 1

## 2021-05-16 MED ORDER — HYDROMORPHONE HCL 1 MG/ML IJ SOLN
1.0000 mg | Freq: Once | INTRAMUSCULAR | Status: AC
  Administered 2021-05-16: 1 mg via INTRAVENOUS
  Filled 2021-05-16: qty 1

## 2021-05-16 NOTE — Progress Notes (Signed)
Inpatient Diabetes Program Recommendations  AACE/ADA: New Consensus Statement on Inpatient Glycemic Control (2015)  Target Ranges:  Prepandial:   less than 140 mg/dL      Peak postprandial:   less than 180 mg/dL (1-2 hours)      Critically ill patients:  140 - 180 mg/dL   Lab Results  Component Value Date   GLUCAP 106 (H) 05/16/2021   HGBA1C 7.5 (H) 05/02/2021    Review of Glycemic Control  Latest Reference Range & Units 05/15/21 23:54 05/16/21 04:07 05/16/21 07:52 05/16/21 09:47  Glucose-Capillary 70 - 99 mg/dL 289 (H) 278 (H) 158 (H) 106 (H)  (H): Data is abnormally high Diabetes history: Type 2 DM Outpatient Diabetes medications: none Current orders for Inpatient glycemic control: Novolog 0-15 units Q4H, Novolog 5 units TID, Semglee 35 units BID Solumedrol 20 mg BID Osmolite @ 83 ml/hr from 1800-0600  Inpatient Diabetes Program Recommendations:    Consider: -Discontinuation of Novolog 5 units TID until patient's oral intake improves -Decrease AM dose of Semglee to back to 30 units QAM - Add Novolog 3 units at 2000, 0000, 0400 Secure chat sent to RN to verify oral intake and MD with recs.   Thanks, Bronson Curb, MSN, RNC-OB Diabetes Coordinator [phone] (8a-5p)

## 2021-05-16 NOTE — Progress Notes (Addendum)
Nutrition Follow-up  DOCUMENTATION CODES:   Non-severe (moderate) malnutrition in context of acute illness/injury  INTERVENTION:   Continue nocturnal TF via Cortrak: Osmolite 1.5 decrease to 50 ml/h x 12 hours at night (6pm-6am), 600 ml total per night Prosource TF 45 ml QID  Provides 1060 kcal (48 % of estimated needs), 82 gm protein (68% of estimated needs), 459 ml free water daily.  Recommend discontinue TF when patient is eating >/= 75% of meals consistently.  Continue MVI with minerals daily via tube.  Continue Ensure Enlive po BID, each supplement provides 350 kcal and 20 grams of protein.  Encourage intake of meals and supplements.   NUTRITION DIAGNOSIS:   Moderate Malnutrition related to acute illness (influenza A) as evidenced by mild muscle depletion, mild fat depletion.  Ongoing  GOAL:   Patient will meet greater than or equal to 90% of their needs  Progressing  MONITOR:   TF tolerance, Labs, Diet advancement  REASON FOR ASSESSMENT:   Consult Enteral/tube feeding initiation and management  ASSESSMENT:   75 yo male admitted from [facility] with acute respiratory failure, influenza A. PMH includes COPD, CAD, ulcerative colitis, HTN, asthma, DM-2, TIA, GERD, HLD.  Discussed patient in ICU rounds and with RN today.  Remains on dysphagia 2 diet with thin liquids, Ensure Enlive BID. Patient consumed 25% of breakfast and 75% of lunch today. Refused Ensure supplement this morning.   Cortrak in place. Tip is at the LOT. Currently receiving nocturnal TF via Cortrak: Osmolite 1.5 at 83 ml/h x 12 hours (1 L total) with Prosource TF 45 ml QID, tolerating well.  Nocturnal TF is providing ~75% of estimated needs. Will decrease rate further tonight.   Labs reviewed.  CBG: [id number]  Medications reviewed and include Colace, Novolog, Semglee, Remeron, Protonix, Miralax.  I/O +7 L since admission. Admission weight 85.4 kg Current weight 84.4 kg  Diet Order:   Diet  Order             DIET DYS 2 Room service appropriate? Yes; Fluid consistency: Thin  Diet effective now                   EDUCATION NEEDS:   No education needs have been identified at this time  Skin:  Skin Assessment: Reviewed RN Assessment (skin tear to scrotum)  Last BM:  12/8 type 6  Height:   Ht Readings from Last 1 Encounters:  04/27/21 5\' 9"  (1.753 m)    Weight:   Wt Readings from Last 1 Encounters:  05/16/21 84.4 kg    BMI:  Body mass index is 27.48 kg/m.  Estimated Nutritional Needs:   Kcal:  2200-2400  Protein:  120-130 gm  Fluid:  2.1-2.3 L    Lucas Mallow, RD, LDN, CNSC Please refer to Amion for contact information.

## 2021-05-16 NOTE — Progress Notes (Addendum)
[facility] Progress Note Patient Name: PARAS KREIDER DOB: 31-Jan-1946 MRN: [id number]   Date of Service  05/16/2021  HPI/Events of Note  Request to review EKG and CXR. CXR reveals stable retrocardiac consolidation, possibly reflecting infection or aspiration in the appropriate clinical setting. COPD. Stable cardiomegaly. EKG reveals sinus tachycardia - HR = 132. Hyperkalemia - K+ = 5.5.  eICU Interventions  Plan: Lokelma 10 gm per tube now. Repeat BMP at 12 noon. Metoprolol 2.5 mg IV X 1 now.      Intervention Category Major Interventions: Other:  Elizebath Wever Cornelia Copa 05/16/2021, 4:18 AM

## 2021-05-16 NOTE — Progress Notes (Signed)
Report called to the receiving RN.  

## 2021-05-16 NOTE — Progress Notes (Signed)
PT Cancellation Note  Patient Details Name: Ronald [name] MRN: [id number] DOB: Mar 11, 1946   Cancelled Treatment:    Reason Eval/Treat Not Completed: Patient not medically ready (pt currently on bipap with frank blood in stool this am. RN requests hold)   Ronald [name] 05/16/2021, 9:11 AM Ronald [name], PT Acute Rehabilitation Services Pager: [phone] Office: [phone]

## 2021-05-16 NOTE — Progress Notes (Signed)
NAME:  JAYRON MAQUEDA, MRN:  [id number], DOB:  07/31/1945, LOS: 15 ADMISSION DATE:  05/01/2021, CONSULTATION DATE:  05/01/2021 REFERRING MD:  Dr. Pearline Cables, CHIEF COMPLAINT:  Respiratory Distress    History of Present Illness:  Mr Leonardo Makris is a 75 year old male with PMHx of COPD GOLD Stage 3-4, tracheal stenosis s/p trach in 2018, CAD s/p DESx2 to LAD in 2015, hypertension, hyperlipidemia, chronic respiratory failure on 2L, HFpEF, type II diabetes presenting with shortness of breath. He was given steroids and duonebs by EMS and placed on CPAP without significant improvement Patient intubated in the ED for airway protection, given steroids, broad spectrum antibiotics and started on levophed for hypotension and admitted to [facility].    Patient had similar presentation for SOB to ED on 11/19 and recommended for admission for COPD exacerbation but declined admission. He was given prednisone and doxycycline on discharge.   Pertinent  Medical History    Anxiety     Asthma     Chronic lower back pain     COPD (chronic obstructive pulmonary disease) (HCC)     Coronary artery disease      a. NSTEMI 05/2014 s/p DESx2 to LAD at [facility].   Depression     Educated about COVID-19 virus infection 03/06/2020   GERD (gastroesophageal reflux disease)     High cholesterol     Hypertension     NSTEMI (non-ST elevated myocardial infarction) ([place]) 05/2014    with stent placement   Sleep apnea     Stroke ([place]) 2017    anyeusym    TIA (transient ischemic attack)      "they say I've had some mini strokes; don't know when"; denies residual on 06/22/2014)   Type II diabetes mellitus ([place])     Ulcerative colitis ([place])    Significant Hospital Events: Including procedures, antibiotic start and stop dates in addition to other pertinent events   11/23 intubated resp distress and severe air trapped; [facility] 11/24 Continued on full vent support for acute respiratory failure and amiodarone gtt and  heparin gtt for Afib with RVR  11/25 Extubated. Re-intubated that night due to tachypnea 11/30 attempt at weaning 12/1 extubated 11/30, difficulty tolerating BiPAP, significant agitation 12/3 Remains on Precedex due to agitation 12/5 intermittent BiPAP nightly; continuing to require precedex for agitation 12/7 continued on precedex for agitation   Interim History / Subjective:  No significant overnight events; tolerating BiPAP This morning, drowsy but arousable. No acute concerns. Weaning precedex   Objective   Blood pressure (!) 94/49, pulse (!) 44, temperature 97.7 F (36.5 C), temperature source Axillary, resp. rate (!) 34, weight 84.4 kg, SpO2 100 %.    Vent Mode: BIPAP;PCV FiO2 (%):  [30 %] 30 % Set Rate:  [12 bmp] 12 bmp PEEP:  [6 cmH20] 6 cmH20   Intake/Output Summary (Last 24 hours) at 05/16/2021 0744 Last data filed at 05/16/2021 0600 Gross per 24 hour  Intake 1388.26 ml  Output 750 ml  Net 638.26 ml   Filed Weights   05/14/21 0405 05/15/21 0500 05/16/21 0500  Weight: 84.1 kg 84.3 kg 84.4 kg   Physical Exam: General: Chronically ill-appearing elderly male, no acute distress  HENT: [place], AT, OP clear, MMM Eyes: EOMI, no scleral icterus Respiratory: Diminished breath sounds bilaterally.  No crackles, wheezing or rales Cardiovascular: RRR, -M/R/G, no JVD GI: BS+, soft, nontender Extremities: significant bilat upper extremity edema Neuro: Awake, alert and appropriately interactive; no focal deficits  Resolved Hospital Problem list   Shock liver AKI Hyperkalemia  Hypotension  Assessment & Plan:  Acute metabolic encephalopathy Agitation requiring sedation -PAD protocol for RASS goal -1. Weaned off precedex this morning. Calm and interacting appropriately  - Continue seroquel 100mg  bid dosing for agitation - Increased home xanax for anxiety  Acute on chronic hypoxic and hypercapnic respiratory failure Chronic obstructive pulmonary disease History of tracheal  stenosis Completed steroid taper. CXR with stable retrocardiac consolidation - infection vs aspiration. However, oxygen sats remain stable on 2-3L [place]. He remains afebrile and leukocytosis is stable. Will hold off on initiation of abx at this time.  -BiPAP nightly -oxygen supplementation as needed, goal SpO2 88-92% -Continue bronchodilators: Cont Pulmicort, Duonebs, and brovana  Atrial flutter with RVR - improved  - Continue amiodarone, coreg and digoxin  -Continue cardiac monitoring  -Holding Eliquis in setting of GI bleed as below  History of diastolic heart failure Hypertension Echocardiogram with normal function -Cautious diuresis as tolerated  Acute on chronic anemia BRBPR Pt noted to have two episodes of bright red blood per rectum. Pt has been on protonix per tube, will change to IV. Hb 8.9>8.0 this AM. Repeat H/H this PM - Trend CBC  - IV protonix 40mg  bid - Holding Eliquis  - Transfuse goal Hb >8 - Will consult GI if bleeding is persistent  Type 2 diabetes -Increase Semglee to 30U in AM, 35U in PM+ SSI  - Novolog 3U q4h at night while tube feeds running - CBG monitoring with goal 140-180  Urinary retention - Continue cardura   Best Practice (right click and "Reselect all SmartList Selections" daily)   Diet/type: tubefeeds DVT prophylaxis: SCD GI prophylaxis: PPI Lines: N/A Foley:  N/A Code Status:  full code Last date of multidisciplinary goals of care discussion [family updated 12/8]   Harvie Heck, MD Internal Medicine, PGY-3 05/16/21 7:44 AM Pager # [phone]

## 2021-05-16 NOTE — Consult Note (Signed)
   [facility] CM Inpatient Consult   05/16/2021  CARROLL LINGELBACH 1946/05/25 [id number]  [facility] [ACO] Patient: [place] Medicare  Primary Care Provider:  [name], FNP, [place], is an embedded provider with a Chronic Care Management team and program, and is listed for the transition of care follow up and appointments.  Patient was screened for Embedded practice service needs for chronic care management notes patient has been active in CCM program with Embedded RN and Pharmacist prior to admission.  Patient is transitioning to a progressive level of care from ICU  Plan: Continue to follow progress. Notification to be sent to the [facility] Management team when [facility] is determined.  Please contact for further questions,  [name], RN BSN [facility] Liaison  [phone] business mobile phone Toll free office [phone]  Fax number: [phone] [email or web address]

## 2021-05-16 NOTE — Progress Notes (Signed)
MD Harvie Heck at bedside to assess hematoma of L hip and groin. One time dilaudid 1 mg ordered for pain at this site.

## 2021-05-16 NOTE — Progress Notes (Addendum)
Patient has increased work of breathing. Rhonchi present in upper lobes upon ausculation.  Patient has weak congested cough.  Breathing treatment given per respiratory.    Patient appears anxious and restless. He is refusing bipap. PRNs given for anxiety and patient placed back on bipap,  [place] notified.

## 2021-05-16 NOTE — Progress Notes (Signed)
[facility] Progress Note Patient Name: Ronald [name] DOB: 06-14-1945 MRN: [id number]   Date of Service  05/16/2021  HPI/Events of Note  Patient had BM with BRB, Hgb = 8.0. Stool sent for occult blood. Of note the patient is on Eliquis for AFlutter.   eICU Interventions  Plan: H/H at 12 noon.  Observe for further BRB per rectum.     Intervention Category Major Interventions: Other: Intermediate Interventions: Other:  Lysle Dingwall 05/16/2021, 6:53 AM

## 2021-05-16 NOTE — Consult Note (Signed)
Referring Provider: Dr. Marva Panda Primary Care Physician:  [name], FNP Primary Gastroenterologist:  Dr. Jenetta Downer  Reason for Consultation:  GI bleed  HPI: Ronald [name] is a 75 y.o. male with PMHx of COPD GOLD Stage 3-4, tracheal stenosis s/p trach in 2018, CAD s/p DESx2 to LAD in 2015, hypertension, hyperlipidemia, chronic respiratory failure on 2L, HFpEF, type II diabetes presenting with shortness of breath. He was given steroids and duonebs by EMS and placed on CPAP without significant improvement Patient intubated in the ED for airway protection, given steroids, broad spectrum antibiotics and started on levophed for hypotension and admitted to [facility] on 11/23.  This AM he had two large bloody BMs so GI was contacted.  Hgb was 14.5 grams 11 days ago.  Hgb is 8.0 grams this AM, but then repeats show 10.2 grams and 9.1 grams.  He has GI history with Dr. Jenetta Downer in [place] and just had procedures earlier this year as below.  He says he has minimal abdominal pain.  No nausea.    Colonoscopy 07/20/2020 showed the following:  - Perianal skin tags found on perianal exam. - The examined portion of the ileum was normal. - Inactive (Mayo Score 0) ulcerative colitis. Biopsied. - Three 2 to 3 mm polyps in the cecum, removed with a cold biopsy forceps. Resected and retrieved. - Five 3 to 5 mm polyps in the transverse colon, removed with a cold snare. Resected and retrieved. - One 1 mm polyp in the rectum, removed with a cold snare. Resected and retrieved. - Diverticulosis in the sigmoid colon. - Non-bleeding internal hemorrhoids.  EGD 07/20/2020 showed the following:  - No endoscopic esophageal abnormality to explain patient's dysphagia. Esophagus dilated. Dilated. - White nummular lesions in esophageal mucosa. Biopsied. - Mucosal nodule found in the esophagus. Biopsied. - 3 cm hiatal hernia with a few Cameron ulcers. - Gastritis. - Normal examined duodenum.   Pathology:  A. GE  JUNCTION, BIOPSY:  -  Reactive squamocolumnar junction with chronic inflammation  -  No intestinal metaplasia, dysplasia or malignancy identified   B. ESOPHAGUS, BIOPSY:  -  Reactive squamous mucosa with acute inflammation  -  Fungal elements present  -  See comment   C. COLON, CECAL, BIOPSY:  -  Benign colonic mucosa  -  No active inflammation or evidence of microscopic colitis  -  No high grade dysplasia or malignancy identified   D. COLON, CECAL, POLYPECTOMY:  -  Tubular adenoma (3 of 3 fragments)  -  No high grade dysplasia or malignancy identified   E. COLON, ASCENDING, BIOPSY:  -  Benign colonic mucosa  -  No active inflammation or evidence of microscopic colitis  -  No high grade dysplasia or malignancy identified   F. COLON, TRANSVERSE, BIOPSY:  -  Benign colonic mucosa  -  No active inflammation or evidence of microscopic colitis  -  No high grade dysplasia or malignancy identified   G. COLON, TRANSVERSE, POLYPECTOMY:  -  Tubular adenoma (2 of 2 fragments)  -  No high grade dysplasia or malignancy identified   H. COLON, DESCENDING, BIOPSY:  -  Benign colonic mucosa  -  No active inflammation or evidence of microscopic colitis  -  No high grade dysplasia or malignancy identified   I. COLON, SIGMOID, BIOPSY:  -  Benign colonic mucosa  -  No active inflammation or evidence of microscopic colitis  -  No high grade dysplasia or malignancy identified   J. RECTUM, BIOPSY:  -  Benign colonic mucosa  -  No active inflammation or evidence of microscopic colitis  -  No high grade dysplasia or malignancy identified   K. RECTUM, POLYPECTOMY:  -  Tubular adenoma (1 of 1 fragments)  -  No high grade dysplasia or malignancy identified   Past Medical History:  Diagnosis Date   Anxiety    Asthma    Chronic lower back pain    COPD (chronic obstructive pulmonary disease) (HCC)    Coronary artery disease    a. NSTEMI 05/2014 s/p DESx2 to LAD at [facility].   Depression     Educated about COVID-19 virus infection 03/06/2020   GERD (gastroesophageal reflux disease)    High cholesterol    Hypertension    NSTEMI (non-ST elevated myocardial infarction) ([place]) 05/2014   with stent placement   Sleep apnea    Stroke ([place]) 2017   anyeusym    TIA (transient ischemic attack)    "they say I've had some mini strokes; don't know when"; denies residual on 06/22/2014)   Type II diabetes mellitus ([place])    Ulcerative colitis ([place])     Past Surgical History:  Procedure Laterality Date   APPENDECTOMY     BIOPSY  07/20/2020   Procedure: BIOPSY;  Surgeon: Harvel Quale, MD;  [facility];  Service: Gastroenterology;;   CARDIAC CATHETERIZATION  [phone] 3   CHOLECYSTECTOMY     COLONOSCOPY WITH PROPOFOL N/A 07/20/2020   Procedure: COLONOSCOPY WITH PROPOFOL;  Surgeon: Harvel Quale, MD;  Location: [facility];  Service: Gastroenterology;  Laterality: N/A;  1:15   CORONARY ANGIOPLASTY WITH STENT PLACEMENT  05/2014   "2"   ESOPHAGEAL DILATION N/A 07/20/2020   Procedure: ESOPHAGEAL DILATION;  Surgeon: Harvel Quale, MD;  [facility]: [facility];  Service: Gastroenterology;  Laterality: N/A;   ESOPHAGOGASTRODUODENOSCOPY (EGD) WITH PROPOFOL N/A 07/20/2020   Procedure: ESOPHAGOGASTRODUODENOSCOPY (EGD) WITH PROPOFOL;  Surgeon: Harvel Quale, MD;  [facility];  Service: Gastroenterology;  Laterality: N/A;   LEFT HEART CATH AND CORONARY ANGIOGRAPHY N/A 05/25/2020   Procedure: LEFT HEART CATH AND CORONARY ANGIOGRAPHY;  Surgeon: Martinique, Peter M, MD;  Location: [place] CV LAB;  Service: Cardiovascular;  Laterality: N/A;   POLYPECTOMY  07/20/2020   Procedure: POLYPECTOMY INTESTINAL;  Surgeon: Harvel Quale, MD;  Location: [facility];  Service: Gastroenterology;;   TUMOR EXCISION Right ~ 1999   "side of my upper head"    Prior to Admission medications   Medication Sig Start Date End Date Taking?  Authorizing Provider  albuterol (VENTOLIN HFA) 108 (90 Base) MCG/ACT inhaler INHALE 2 PUFFS EVERY 6 HOURS AS NEEEDED Patient taking differently: Inhale 2 puffs into the lungs every 6 (six) hours as needed for wheezing or shortness of breath. 11/02/19  Yes Hawks, Christy A, FNP  ALPRAZolam (XANAX) 0.5 MG tablet Take 1 tablet (0.5 mg total) by mouth 2 (two) times daily as needed. for anxiety 01/17/21  Yes Hawks, Christy A, FNP  amLODipine (NORVASC) 5 MG tablet Take 1 tablet (5 mg total) by mouth daily. 02/04/21  Yes Hawks, Christy A, FNP  Ascorbic Acid (VITAMIN C) 1000 MG tablet Take 1,000 mg by mouth daily.   Yes [provider]  atorvastatin (LIPITOR) 40 MG tablet TAKE 1 TABLET BY MOUTH ONCE DAILY IN THE EVENING Patient taking differently: Take 40 mg by mouth every evening. 12/05/20  Yes Hawks, Christy A, FNP  cholecalciferol (VITAMIN D3) 25 MCG (1000 UNIT) tablet Take 1,000  Units by mouth daily.   Yes [provider]  doxycycline (VIBRAMYCIN) 100 MG capsule Take 1 capsule (100 mg total) by mouth 2 (two) times daily. One po bid x 7 days Patient taking differently: Take 100 mg by mouth 2 (two) times daily. For 7 days 04/27/21  Yes Milton Ferguson, MD  escitalopram (LEXAPRO) 20 MG tablet Take 1 tablet (20 mg total) by mouth daily. 02/12/21  Yes Hawks, Christy A, FNP  fluticasone (FLONASE) 50 MCG/ACT nasal spray Place 2 sprays into both nostrils daily. Patient taking differently: Place 2 sprays into both nostrils daily as needed for allergies or rhinitis. 11/15/20  Yes Gwenlyn Perking, FNP  guaiFENesin (REFENESEN 400 PO) Take 2 tablets by mouth daily.   Yes [provider]  ibuprofen (ADVIL) 200 MG tablet Take 800 mg by mouth every 6 (six) hours as needed for mild pain.   Yes [provider]  ipratropium-albuterol (DUONEB) 0.5-2.5 (3) MG/3ML SOLN USE 1 AMPULE IN NEBULIZER 4 TIMES DAILY Patient taking differently: Take 3 mLs by nebulization 4 (four) times daily as needed  (wheezing/shortness of breath). 04/29/21  Yes Hawks, Christy A, FNP  isosorbide mononitrate (IMDUR) 30 MG 24 hr tablet Take 1 tablet (30 mg total) by mouth daily. 04/09/21  Yes Branch, Alphonse Guild, MD  losartan (COZAAR) 100 MG tablet Take 1 tablet (100 mg total) by mouth daily. 02/05/21  Yes Hawks, Christy A, FNP  metoprolol tartrate (LOPRESSOR) 50 MG tablet Take 1 tablet (50 mg total) by mouth 2 (two) times daily. 03/04/21  Yes Hawks, Christy A, FNP  mirtazapine (REMERON) 15 MG tablet Take 1 tablet (15 mg total) by mouth at bedtime. 01/01/21  Yes Hawks, Christy A, FNP  nitroGLYCERIN (NITROSTAT) 0.4 MG SL tablet Place 0.4 mg under the tongue every 5 (five) minutes as needed for chest pain.   Yes [provider]  OXYGEN Inhale 3 L into the lungs at bedtime.   Yes [provider]  pantoprazole (PROTONIX) 40 MG tablet Take 1 tablet by mouth once daily Patient taking differently: 40 mg daily. 11/26/20  Yes Hawks, Christy A, FNP  predniSONE (DELTASONE) 10 MG tablet Take 2 tablets (20 mg total) by mouth daily. Patient taking differently: Take 20 mg by mouth daily. For 7 days 04/27/21  Yes Milton Ferguson, MD  Vibegron (GEMTESA) 75 MG TABS Take 75 mg by mouth daily. Patient taking differently: Take 75 mg by mouth at bedtime. 04/18/21  Yes Irine Seal, MD  aspirin EC 81 MG tablet Take 1 tablet (81 mg total) by mouth daily. Patient not taking: Reported on 05/03/2021 10/15/18   Herminio Commons, MD  Blood Glucose Monitoring Suppl (ACCU-CHEK GUIDE ME) w/Device KIT Check BS daily Dx E11.9 05/16/20   [name] A, FNP  furosemide (LASIX) 20 MG tablet Take 1 tablet (20 mg total) by mouth 2 (two) times daily. Patient not taking: Reported on 05/03/2021 01/01/21   [name] A, FNP  glucose blood (ONE TOUCH ULTRA TEST) test strip USE TO CHECK GLUCOSE ONCE DAILY 02/11/18   Terald Sleeper, PA-C  Lancets [facility] ULTRASOFT) lancets Use as instructed   DX E11.9 05/05/17   [name] A, FNP   nicotine (NICODERM CQ - DOSED IN MG/24 HOURS) 14 mg/24hr patch Place 1 patch (14 mg total) onto the skin daily. Patient not taking: Reported on 05/03/2021 06/14/20   [name], FNP    Current Facility-Administered Medications  Medication Dose Route Frequency Provider Last Rate Last Admin  0.9 %  sodium chloride infusion  250 mL Intravenous Continuous Wynona Dove A, DO   Stopped at 05/07/21 1726   acetaminophen (TYLENOL) tablet 650 mg  650 mg Per Tube Q4H PRN Ander Slade, Adewale A, MD   650 mg at 05/14/21 1642   ALPRAZolam (XANAX) tablet 1 mg  1 mg Per Tube BID PRN Harvie Heck, MD   1 mg at 05/16/21 0313   amiodarone (PACERONE) tablet 100 mg  100 mg Per Tube Daily Joselyn Glassman A, RPH   100 mg at 05/16/21 0950   apixaban (ELIQUIS) tablet 5 mg  5 mg Per Tube BID Mannam, Praveen, MD   5 mg at 05/15/21 2107   arformoterol (BROVANA) nebulizer solution 15 mcg  15 mcg Nebulization BID Harvie Heck, MD   15 mcg at 05/16/21 7342   aspirin chewable tablet 81 mg  81 mg Per Tube Daily Olalere, Adewale A, MD   81 mg at 05/15/21 1055   atorvastatin (LIPITOR) tablet 40 mg  40 mg Per Tube QPM Olalere, Adewale A, MD   40 mg at 05/15/21 1804   bisacodyl (DULCOLAX) suppository 10 mg  10 mg Rectal Daily PRN Omar Person, NP       budesonide (PULMICORT) nebulizer solution 0.5 mg  0.5 mg Nebulization BID Renee Pain, MD   0.5 mg at 05/16/21 0826   carvedilol (COREG) tablet 25 mg  25 mg Per Tube BID WC Pierce, Dwayne A, RPH       Chlorhexidine Gluconate Cloth 2 % PADS 6 each  6 each Topical Daily Kamat, Lovenia Kim, MD   6 each at 05/15/21 1210   dexmedetomidine (PRECEDEX) 400 MCG/100ML (4 mcg/mL) infusion  0-1.2 mcg/kg/hr Intravenous Continuous Mannam, Praveen, MD   Stopped at 05/16/21 0844   digoxin (LANOXIN) tablet 0.25 mg  0.25 mg Per Tube Daily Mannam, Praveen, MD   0.25 mg at 05/16/21 0950   docusate (COLACE) 50 MG/5ML liquid 100 mg  100 mg Per Tube BID Mannam, Praveen, MD   100 mg at 05/15/21  2106   escitalopram (LEXAPRO) tablet 20 mg  20 mg Per Tube Daily Olalere, Adewale A, MD   20 mg at 05/16/21 0950   feeding supplement (ENSURE ENLIVE / ENSURE PLUS) liquid 237 mL  237 mL Oral BID BM Mannam, Praveen, MD   237 mL at 05/16/21 1325   feeding supplement (OSMOLITE 1.5 CAL) liquid 1,000 mL  1,000 mL Per Tube Q24H Mannam, Praveen, MD   Stopped at 05/16/21 0600   feeding supplement (PROSource TF) liquid 45 mL  45 mL Per Tube QID Mannam, Praveen, MD   45 mL at 05/16/21 0604   fentaNYL (SUBLIMAZE) injection 25 mcg  25 mcg Intravenous Q15 min PRN Mannam, Praveen, MD       fentaNYL (SUBLIMAZE) injection 25-100 mcg  25-100 mcg Intravenous Q30 min PRN Mannam, Praveen, MD       fentaNYL (SUBLIMAZE) injection 50 mcg  50 mcg Intravenous Q2H PRN Olalere, Adewale A, MD   50 mcg at 05/16/21 0033   guaiFENesin (ROBITUSSIN) 100 MG/5ML liquid 5 mL  5 mL Per Tube Q6H Olalere, Adewale A, MD   5 mL at 05/16/21 0838   hydrALAZINE (APRESOLINE) injection 10 mg  10 mg Intravenous Q4H PRN Anders Simmonds, MD   10 mg at 05/09/21 0048   insulin aspart (novoLOG) injection 0-15 Units  0-15 Units Subcutaneous Q4H Renee Pain, MD   3 Units at 05/16/21 0838   insulin glargine-yfgn (SEMGLEE) injection 30  Units  30 Units Subcutaneous q morning Aslam, Sadia, MD       And   insulin glargine-yfgn (SEMGLEE) injection 35 Units  35 Units Subcutaneous QHS Aslam, Sadia, MD       ipratropium (ATROVENT) nebulizer solution 0.5 mg  0.5 mg Nebulization Q6H Mannam, Praveen, MD   0.5 mg at 05/16/21 0825   isosorbide mononitrate (ISMO) tablet 10 mg  10 mg Per Tube BID Margaretha Seeds, MD   10 mg at 05/16/21 0950   levalbuterol (XOPENEX) nebulizer solution 1.25 mg  1.25 mg Nebulization Q6H Mannam, Praveen, MD   1.25 mg at 05/16/21 0825   MEDLINE mouth rinse  15 mL Mouth Rinse BID Olalere, Adewale A, MD   15 mL at 05/16/21 0952   midodrine (PROAMATINE) tablet 5 mg  5 mg Per Tube TID WC Pierce, Dwayne A, RPH   5 mg at 05/16/21 9622    mirtazapine (REMERON) tablet 15 mg  15 mg Per Tube QHS Olalere, Adewale A, MD   15 mg at 05/15/21 2106   nicotine (NICODERM CQ - dosed in mg/24 hours) patch 14 mg  14 mg Transdermal Daily Olalere, Adewale A, MD   14 mg at 05/16/21 0956   ondansetron (ZOFRAN) injection 4 mg  4 mg Intravenous Q6H PRN Renee Pain, MD       pantoprazole (PROTONIX) injection 40 mg  40 mg Intravenous Q12H Mannam, Praveen, MD       polyethylene glycol (MIRALAX / GLYCOLAX) packet 17 g  17 g Per Tube Daily Mannam, Praveen, MD       QUEtiapine (SEROQUEL) tablet 100 mg  100 mg Per Tube BID Aslam, Sadia, MD   100 mg at 05/16/21 0950   sennosides (SENOKOT) 8.8 MG/5ML syrup 5 mL  5 mL Per Tube BID PRN Ander Slade, Adewale A, MD        Allergies as of 05/01/2021 - Review Complete 05/01/2021  Allergen Reaction Noted   Gabapentin Anxiety 11/05/2016   Metformin and related Rash 05/16/2020    Family History  Problem Relation Age of Onset   CAD Father    Lung cancer Brother        smoked   Cancer Brother        lung   Leukemia Sister    Dementia Sister    Stroke Mother    Emphysema Sister    Cancer Brother        lung    Social History   Socioeconomic History   Marital status: Divorced    Spouse name: Ronald [name]   Number of children: 1   Years of education: Not on file   Highest education level: Not on file  Occupational History   Occupation: Retired    Comment: Aeronautical engineer  Tobacco Use   Smoking status: Every Day    Packs/day: 1.50    Years: 48.00    Pack years: 72.00    Types: Cigarettes    Start date: 02/12/1966   Smokeless tobacco: Never   Tobacco comments:    smokes 5-6 cigarettes per day 06/07/2020  Vaping Use   Vaping Use: Never used  Substance and Sexual Activity   Alcohol use: Not Currently    Alcohol/week: 0.0 standard drinks   Drug use: No   Sexual activity: Never  Other Topics Concern   Not on file  Social History Narrative   Staying with his ex-wife, Ronald [name]   Has 2 children (today he  told me one child 01/15/21)   Social Determinants  of Health   Financial Resource Strain: Low Risk    Difficulty of Paying Living Expenses: Not very hard  Food Insecurity: No Food Insecurity   Worried About Running Out of Food in the Last Year: Never true   Ran Out of Food in the Last Year: Never true  Transportation Needs: No Transportation Needs   Lack of Transportation (Medical): No   Lack of Transportation (Non-Medical): No  Physical Activity: Inactive   Days of Exercise per Week: 0 days   Minutes of Exercise per Session: 0 min  Stress: No Stress Concern Present   Feeling of Stress : Only a little  Social Connections: Moderately Isolated   Frequency of Communication with Friends and Family: Twice a week   Frequency of Social Gatherings with Friends and Family: Once a week   Attends Religious Services: Never   Marine scientist or Organizations: No   Attends Music therapist: Never   Marital Status: Living with partner  Intimate Partner Violence: Not At Risk   Fear of Current or Ex-Partner: No   Emotionally Abused: No   Physically Abused: No   Sexually Abused: No    Review of Systems: ROS is O/W negative except as mentioned in HPI.  Physical Exam: Vital signs in last 24 hours: Temp:  [97.5 F (36.4 C)-98.4 F (36.9 C)] 98 F (36.7 C) (12/08 1118) Pulse Rate:  [44-130] 116 (12/08 1300) Resp:  [15-45] 33 (12/08 1300) BP: (71-163)/(44-107) 143/107 (12/08 1300) SpO2:  [92 %-100 %] 92 % (12/08 1300) FiO2 (%):  [30 %] 30 % (12/08 0828) Weight:  [84.4 kg] 84.4 kg (12/08 0500) Last BM Date: 05/16/21 General:  Alert, chronically ill-appearing, pleasant and cooperative in NAD Head:  Normocephalic and atraumatic. Eyes:  Sclera clear, no icterus.  Conjunctiva pink. Ears:  Normal auditory acuity. Mouth:  No deformity or lesions.   Lungs:  Clear throughout to auscultation, but cannot hold a conversation or barely finish a sentence without SOB/increase  WOB. Heart:  Tachy. Abdomen:  Soft, non-distended.  BS present.  Non-tender. Rectal:  Deferred.  Msk:  Symmetrical without gross deformities. Pulses:  Normal pulses noted. Extremities:  Upper extremity/hand edema noted. Neurologic:  Alert and oriented x 4;  grossly normal neurologically. Skin:  Intact without significant lesions or rashes.  Intake/Output from previous day: 12/07 0701 - 12/08 0700 In: 1388.3 [I.V.:292.3; NG/GT:1096] Out: 750 [Urine:750] Intake/Output this shift: Total I/O In: 314.3 [P.O.:240; I.V.:34.3; NG/GT:40] Out: -   Lab Results: Recent Labs    05/14/21 0452 05/15/21 0256 05/16/21 0120 05/16/21 1024  WBC 10.9* 11.4* 11.1*  --   HGB 9.1* 8.9* 8.0* 10.2*  HCT 28.7* 27.4* 25.5* 30.0*  PLT 237 275 296  --    BMET Recent Labs    05/14/21 1358 05/15/21 0256 05/16/21 0120 05/16/21 1024  NA 138 136 134* 137  K 5.4* 5.1 5.5* 4.8  CL 97* 95* 97*  --   CO2 34* 35* 31  --   GLUCOSE 169* 248* 285*  --   BUN 36* 37* 39*  --   CREATININE 0.90 0.92 0.93  --   CALCIUM 8.3* 8.2* 8.2*  --    LFT Recent Labs    05/16/21 0120  PROT 4.7*  ALBUMIN 2.2*  AST 18  ALT 23  ALKPHOS 57  BILITOT 1.0    Studies/Results: DG CHEST PORT 1 VIEW  Result Date: 05/16/2021 CLINICAL DATA:  Respiratory distress EXAM: PORTABLE CHEST 1 VIEW COMPARISON:  11:08 a.m.  FINDINGS: The lungs are symmetrically hyperinflated in keeping with changes of underlying COPD. Focal consolidations again seen within the retrocardiac region possibly reflecting lobar pneumonia or aspiration in the acute setting. Cardiac size is mildly enlarged, unchanged. Pulmonary vascularity is normal. No pneumothorax or pleural effusion. Nasoenteric feeding tube extends into the upper abdomen beyond the margin of the examination. IMPRESSION: Stable retrocardiac consolidation, possibly reflecting infection or aspiration in the appropriate clinical setting. COPD. Stable cardiomegaly Electronically Signed   By:  Fidela Salisbury M.D.   On: 05/16/2021 04:07   DG CHEST PORT 1 VIEW  Result Date: 05/15/2021 CLINICAL DATA:  Shortness of breath EXAM: PORTABLE CHEST 1 VIEW COMPARISON:  Previous studies including the examination of 05/10/2021 FINDINGS: Transverse diameter of heart is increased. There are no signs of pulmonary edema. There is peribronchial thickening. There is increased density in the left lower lung fields. There is blunting of both lateral CP angles. There is no pneumothorax. Enteric tube is noted traversing the esophagus. Distal portion of enteric tube is not visualized. IMPRESSION: Cardiomegaly. There are no signs of pulmonary edema or focal pulmonary consolidation. Increased markings are seen in the left lower lung fields suggesting atelectasis/pneumonitis in the left lower lobe. There is blunting of both lateral CP angles suggesting small bilateral pleural effusions. Electronically Signed   By: Elmer Picker M.D.   On: 05/15/2021 13:03    IMPRESSION:  *GI bleed:  Had two large bloody BMs this AM.  ? Diverticular.  ? If he could have developed some ischemic colitis with his hypotension.  Just had colonoscopy earlier this year. *Anemia, normocytic:  Hgb was 14.5 grams 11 days ago.  Then 8.0 grams this AM, but repeats show 10.2 grams and 9.1 grams. *Acute on chronic hypoxic and hypercapnic respiratory failure with COPD and history of tracheal stenosis.  No longer intubated. *Agitation requiring sedation:  Not sedated at this time. *Reported history of UC in 2000 but not on any medication and no evidence of active disease on colonoscopy in 07/2020.  PLAN: -Will observe for now, monitor Hgb. -CTA if he bleeds briskly.   Laban Emperor. Shequita Peplinski  05/16/2021, 1:30 PM

## 2021-05-16 NOTE — Progress Notes (Addendum)
[facility] Progress Note Patient Name: Ronald [name] DOB: 08-18-1945 MRN: [id number]   Date of Service  05/16/2021  HPI/Events of Note  Nursing concerned about 1. Increased WOB, RR = 35 and Lungs sound more congested and 2. HR - 132.  eICU Interventions  Plan: Portable CXR STAT. 12 Lead EKG STAT.     Intervention Category Major Interventions: Respiratory failure - evaluation and management  Kimora Stankovic [place] 05/16/2021, 3:32 AM

## 2021-05-16 NOTE — Progress Notes (Signed)
Patient had bowel movement with frank blood.  [facility] notified.

## 2021-05-17 ENCOUNTER — Inpatient Hospital Stay (HOSPITAL_COMMUNITY): Payer: Medicare HMO

## 2021-05-17 DIAGNOSIS — D62 Acute posthemorrhagic anemia: Secondary | ICD-10-CM

## 2021-05-17 LAB — COMPREHENSIVE METABOLIC PANEL
ALT: 23 U/L (ref 0–44)
AST: 15 U/L (ref 15–41)
Albumin: 2.2 g/dL — ABNORMAL LOW (ref 3.5–5.0)
Alkaline Phosphatase: 56 U/L (ref 38–126)
Anion gap: 6 (ref 5–15)
BUN: 43 mg/dL — ABNORMAL HIGH (ref 8–23)
CO2: 32 mmol/L (ref 22–32)
Calcium: 8.2 mg/dL — ABNORMAL LOW (ref 8.9–10.3)
Chloride: 97 mmol/L — ABNORMAL LOW (ref 98–111)
Creatinine, Ser: 1.32 mg/dL — ABNORMAL HIGH (ref 0.61–1.24)
GFR, Estimated: 57 mL/min — ABNORMAL LOW (ref >60)
Glucose, Bld: 97 mg/dL (ref 70–99)
Potassium: 5.3 mmol/L — ABNORMAL HIGH (ref 3.5–5.1)
Sodium: 135 mmol/L (ref 135–145)
Total Bilirubin: 1.3 mg/dL — ABNORMAL HIGH (ref 0.3–1.2)
Total Protein: 4.9 g/dL — ABNORMAL LOW (ref 6.5–8.1)

## 2021-05-17 LAB — CBC
HCT: 25.2 % — ABNORMAL LOW (ref 39.0–52.0)
Hemoglobin: 8.3 g/dL — ABNORMAL LOW (ref 13.0–17.0)
MCH: 29.5 pg (ref 26.0–34.0)
MCHC: 32.9 g/dL (ref 30.0–36.0)
MCV: 89.7 fL (ref 80.0–100.0)
Platelets: 331 10*3/uL (ref 150–400)
RBC: 2.81 MIL/uL — ABNORMAL LOW (ref 4.22–5.81)
RDW: 14.3 % (ref 11.5–15.5)
WBC: 12.2 10*3/uL — ABNORMAL HIGH (ref 4.0–10.5)
nRBC: 0 % (ref 0.0–0.2)

## 2021-05-17 LAB — GLUCOSE, CAPILLARY
Glucose-Capillary: 76 mg/dL (ref 70–99)
Glucose-Capillary: 82 mg/dL (ref 70–99)
Glucose-Capillary: 78 mg/dL (ref 70–99)
Glucose-Capillary: 113 mg/dL — ABNORMAL HIGH (ref 70–99)
Glucose-Capillary: 58 mg/dL — ABNORMAL LOW (ref 70–99)
Glucose-Capillary: 64 mg/dL — ABNORMAL LOW (ref 70–99)
Glucose-Capillary: 120 mg/dL — ABNORMAL HIGH (ref 70–99)

## 2021-05-17 MED ORDER — INSULIN GLARGINE-YFGN 100 UNIT/ML ~~LOC~~ SOLN
25.0000 [IU] | Freq: Two times a day (BID) | SUBCUTANEOUS | Status: DC
  Administered 2021-05-18 – 2021-05-22 (×7): 25 [IU] via SUBCUTANEOUS
  Filled 2021-05-17 (×11): qty 0.25

## 2021-05-17 MED ORDER — INSULIN ASPART 100 UNIT/ML IJ SOLN
0.0000 [IU] | Freq: Three times a day (TID) | INTRAMUSCULAR | Status: DC
Start: 2021-05-17 — End: 2021-06-06
  Administered 2021-05-18: 2 [IU] via SUBCUTANEOUS
  Administered 2021-05-18 – 2021-05-19 (×2): 1 [IU] via SUBCUTANEOUS
  Administered 2021-05-20: 2 [IU] via SUBCUTANEOUS
  Administered 2021-05-20: 1 [IU] via SUBCUTANEOUS
  Administered 2021-05-21: 2 [IU] via SUBCUTANEOUS
  Administered 2021-05-22: 13:00:00 1 [IU] via SUBCUTANEOUS
  Administered 2021-05-22: 10:00:00 3 [IU] via SUBCUTANEOUS
  Administered 2021-05-23: 1 [IU] via SUBCUTANEOUS
  Administered 2021-05-23: 3 [IU] via SUBCUTANEOUS
  Administered 2021-05-24 (×2): 2 [IU] via SUBCUTANEOUS
  Administered 2021-05-24 – 2021-05-25 (×2): 3 [IU] via SUBCUTANEOUS
  Administered 2021-05-25: 2 [IU] via SUBCUTANEOUS
  Administered 2021-05-25 – 2021-05-26 (×2): 1 [IU] via SUBCUTANEOUS
  Administered 2021-05-26 – 2021-05-27 (×2): 3 [IU] via SUBCUTANEOUS
  Administered 2021-05-27 – 2021-05-28 (×3): 2 [IU] via SUBCUTANEOUS
  Administered 2021-05-29: 13:00:00 1 [IU] via SUBCUTANEOUS
  Administered 2021-05-29: 09:00:00 2 [IU] via SUBCUTANEOUS
  Administered 2021-05-30: 08:00:00 1 [IU] via SUBCUTANEOUS
  Administered 2021-05-31 – 2021-06-01 (×2): 2 [IU] via SUBCUTANEOUS
  Administered 2021-06-02 – 2021-06-03 (×2): 5 [IU] via SUBCUTANEOUS
  Administered 2021-06-03: 17:00:00 3 [IU] via SUBCUTANEOUS
  Administered 2021-06-03: 09:00:00 5 [IU] via SUBCUTANEOUS
  Administered 2021-06-04: 17:00:00 1 [IU] via SUBCUTANEOUS
  Administered 2021-06-04: 12:00:00 2 [IU] via SUBCUTANEOUS
  Administered 2021-06-05: 13:00:00 1 [IU] via SUBCUTANEOUS
  Administered 2021-06-05 – 2021-06-06 (×2): 2 [IU] via SUBCUTANEOUS
  Administered 2021-06-06: 08:00:00 3 [IU] via SUBCUTANEOUS

## 2021-05-17 NOTE — Plan of Care (Signed)
?  Problem: Elimination: ?Goal: Will not experience complications related to bowel motility ?Outcome: Progressing ?  ?Problem: Pain Managment: ?Goal: General experience of comfort will improve ?Outcome: Progressing ?  ?Problem: Safety: ?Goal: Ability to remain free from injury will improve ?Outcome: Progressing ?  ?

## 2021-05-17 NOTE — Progress Notes (Addendum)
[facility]  PROGRESS NOTE  Ronald [name] VQQ:[id number] DOB: [birth year] DOA: 05/01/2021 PCP: [name], FNP   Brief HPI:   75 year old male with past medical history of COPD, Gold stage III-IV, tracheal stenosis s/p trach in 2018, CAD s/p DES x2 to LAD in 2015, hypertension, hyperlipidemia, chronic respiratory failure on 2 L, HFpEF, diabetes mellitus type 2 presented with shortness of breath.  He was started on steroids and DuoNebs by EMS, placed on CPAP without improvement.  Patient was intubated for airway protection.  PCCM was consulted. Patient had 2 episodes of bright red blood per rectum.  GI was consulted. He was started on midodrine for hypotension  11/23 intubated resp distress and severe air trapped; transferred from [facility] to [facility] 11/24 Continued on full vent support for acute respiratory failure and amiodarone gtt and heparin gtt for Afib with RVR  11/25 Extubated. Re-intubated that night due to tachypnea 11/30 attempt at weaning 12/1 extubated 11/30, difficulty tolerating BiPAP, significant agitation 12/3 Remains on Precedex due to agitation 12/5 intermittent BiPAP nightly; continuing to require precedex for agitation 12/7 continued on precedex for agitation  Subjective   Patient seen and examined, denies shortness of breath.     Assessment/Plan:    Lower GI bleed -Patient  had 2 large bloody bowel movements yesterday -We will hold apixaban -Gastroenterology has been consulted -Had colonoscopy earlier this year which showed diverticulosis and internal hemorrhoids  Acute blood loss anemia -Hemoglobin is 8.3 this morning -Secondary to above -Follow CBC in a.m., and transfuse for hemoglobin less than 7  Acute on chronic hypoxemic and hypercapnic respiratory failure -Secondary to COPD exacerbation; history of tracheal stenosis -Completed steroid taper -Continue BiPAP nightly -Continue bronchodilators, Pulmicort, Brovana, DuoNeb  Acute kidney  injury -Creatinine up to 1.32 today -Likely due to hypotension -Follow BMP in am  Atrial flutter with RVR -Heart rate improved, continue amiodarone, Coreg, digoxin -Eliquis will be held in setting of GI bleed  Acute metabolic encephalopathy -Resolved -Continue Seroquel, Xanax as needed  Diabetes mellitus type 2 -Patient on moderate sliding scale insulin with NovoLog -Patient having episodes of hypoglycemia -We will cut down the dose of Lantus to 25 units subcu twice daily -We will change sliding scale insulin to sensitive  Hypotension -Patient currently on midodrine -Asymptomatic          Medications     amiodarone  100 mg Per Tube Daily   apixaban  5 mg Per Tube BID   arformoterol  15 mcg Nebulization BID   aspirin  81 mg Per Tube Daily   atorvastatin  40 mg Per Tube QPM   budesonide (PULMICORT) nebulizer solution  0.5 mg Nebulization BID   carvedilol  25 mg Per Tube BID WC   Chlorhexidine Gluconate Cloth  6 each Topical Daily   digoxin  0.25 mg Per Tube Daily   docusate  100 mg Per Tube BID   escitalopram  20 mg Per Tube Daily   feeding supplement  237 mL Oral BID BM   feeding supplement (OSMOLITE 1.5 CAL)  1,000 mL Per Tube Q24H   feeding supplement (PROSource TF)  45 mL Per Tube QID   guaiFENesin  5 mL Per Tube Q6H   insulin aspart  0-15 Units Subcutaneous Q4H   insulin glargine-yfgn  30 Units Subcutaneous q morning   And   insulin glargine-yfgn  35 Units Subcutaneous QHS   ipratropium  0.5 mg Nebulization Q6H   isosorbide mononitrate  10 mg Per Tube BID  levalbuterol  1.25 mg Nebulization Q6H   mouth rinse  15 mL Mouth Rinse BID   melatonin  3 mg Oral QHS   midodrine  5 mg Per Tube TID WC   mirtazapine  15 mg Per Tube QHS   nicotine  14 mg Transdermal Daily   pantoprazole (PROTONIX) IV  40 mg Intravenous Q12H   polyethylene glycol  17 g Per Tube Daily   QUEtiapine  100 mg Per Tube BID     Data Reviewed:   CBG:  Recent Labs  Lab  05/16/21 2348 05/17/21 0526 05/17/21 0807 05/17/21 1130 05/17/21 1534  GLUCAP 91 76 82 78 58*    SpO2: 97 % O2 Flow Rate (L/min): 2.5 L/min FiO2 (%): 30 %    Vitals:   05/17/21 1134 05/17/21 1419 05/17/21 1502 05/17/21 1536  BP: (!) 122/40 (!) 156/48  (!) 136/40  Pulse: 83 67 (!) 104 66  Resp: 19 (!) 28 14 17   Temp: 97.8 F (36.6 C)   97.8 F (36.6 C)  TempSrc: Oral   Oral  SpO2: 98% 97% 95% 97%  Weight:         Intake/Output Summary (Last 24 hours) at 05/17/2021 1544 Last data filed at 05/17/2021 0946 Gross per 24 hour  Intake 1200 ml  Output 1600 ml  Net -400 ml    12/07 1901 - 12/09 0700 In: 2447.7 [P.O.:330; I.V.:154.7] Out: 2050 [Urine:2050]  Filed Weights   05/14/21 0405 05/15/21 0500 05/16/21 0500  Weight: 84.1 kg 84.3 kg 84.4 kg    Data Reviewed: Basic Metabolic Panel: Recent Labs  Lab 05/14/21 1358 05/15/21 0256 05/16/21 0120 05/16/21 1024 05/16/21 1321 05/17/21 0047  NA 138 136 134* 137 139 135  K 5.4* 5.1 5.5* 4.8 5.3* 5.3*  CL 97* 95* 97*  --  98 97*  CO2 34* 35* 31  --  35* 32  GLUCOSE 169* 248* 285*  --  114* 97  BUN 36* 37* 39*  --  39* 43*  CREATININE 0.90 0.92 0.93  --  1.00 1.32*  CALCIUM 8.3* 8.2* 8.2*  --  8.4* 8.2*   Liver Function Tests: Recent Labs  Lab 05/13/21 0104 05/14/21 0452 05/15/21 0256 05/16/21 0120 05/17/21 0047  AST 22 28 25 18 15   ALT 24 23 24 23 23   ALKPHOS 60 73 64 57 56  BILITOT 0.6 0.6 1.0 1.0 1.3*  PROT 4.4* 4.7* 4.9* 4.7* 4.9*  ALBUMIN 2.0* 2.0* 2.3* 2.2* 2.2*   No results for input(s): LIPASE, AMYLASE in the last 168 hours. No results for input(s): AMMONIA in the last 168 hours. CBC: Recent Labs  Lab 05/14/21 0452 05/15/21 0256 05/16/21 0120 05/16/21 1024 05/16/21 1321 05/16/21 2109 05/17/21 0047  WBC 10.9* 11.4* 11.1*  --   --  13.0* 12.2*  HGB 9.1* 8.9* 8.0* 10.2* 9.1* 8.7* 8.3*  HCT 28.7* 27.4* 25.5* 30.0* 28.4* 26.7* 25.2*  MCV 90.5 89.8 90.7  --   --  89.6 89.7  PLT 237 275 296   --   --  328 331   Cardiac Enzymes: No results for input(s): CKTOTAL, CKMB, CKMBINDEX, TROPONINI in the last 168 hours. BNP (last 3 results) Recent Labs    12/21/20 1456 05/01/21 1310 05/08/21 0014  BNP 364.0* 1,654.0* 115.7*    ProBNP (last 3 results) No results for input(s): PROBNP in the last 8760 hours.  CBG: Recent Labs  Lab 05/16/21 2348 05/17/21 0526 05/17/21 0807 05/17/21 1130 05/17/21 1534  GLUCAP 91 76 82 78  43*       Radiology Reports  DG CHEST PORT 1 VIEW  Result Date: 05/16/2021 CLINICAL DATA:  Respiratory distress EXAM: PORTABLE CHEST 1 VIEW COMPARISON:  11:08 a.m. FINDINGS: The lungs are symmetrically hyperinflated in keeping with changes of underlying COPD. Focal consolidations again seen within the retrocardiac region possibly reflecting lobar pneumonia or aspiration in the acute setting. Cardiac size is mildly enlarged, unchanged. Pulmonary vascularity is normal. No pneumothorax or pleural effusion. Nasoenteric feeding tube extends into the upper abdomen beyond the margin of the examination. IMPRESSION: Stable retrocardiac consolidation, possibly reflecting infection or aspiration in the appropriate clinical setting. COPD. Stable cardiomegaly Electronically Signed   By: Fidela Salisbury M.D.   On: 05/16/2021 04:07   DG HIP PORT UNILAT WITH PELVIS 1V RIGHT  Result Date: 05/17/2021 CLINICAL DATA:  Hip pain. EXAM: DG HIP (WITH OR WITHOUT PELVIS) 1V PORT RIGHT COMPARISON:  Abdominal radiograph 05/10/2021 FINDINGS: No acute fracture or hip dislocation is identified. Right hip joint space width is preserved. A 2.5 cm dense focus in the right pelvis may reflect some residual oral contrast material from the prior study. Atherosclerotic vascular calcifications are noted. An enteric tube is partially visualized in the central abdomen. IMPRESSION: No acute osseous abnormality or significant arthropathic changes identified about the right hip. Electronically Signed   By:  Logan Bores M.D.   On: 05/17/2021 14:34       Antibiotics: Anti-infectives (From admission, onward)    Start     Dose/Rate Route Frequency Ordered Stop   05/06/21 1015  oseltamivir (TAMIFLU) capsule 75 mg        75 mg Per Tube 2 times daily 05/06/21 0917 05/06/21 0937   05/05/21 2100  azithromycin (ZITHROMAX) tablet 500 mg        500 mg Per Tube Every 24 hr x 2 05/04/21 2219 05/05/21 2134   05/04/21 2100  azithromycin (ZITHROMAX) tablet 500 mg  Status:  Discontinued        500 mg Oral Every 24 hr x 2 05/04/21 1110 05/04/21 2219   05/03/21 2200  oseltamivir (TAMIFLU) capsule 30 mg  Status:  Discontinued        30 mg Per Tube 2 times daily 05/03/21 1048 05/06/21 0917   05/03/21 1330  cefTRIAXone (ROCEPHIN) 2 g in sodium chloride 0.9 % 100 mL IVPB        2 g 200 mL/hr over 30 Minutes Intravenous Every 24 hours 05/03/21 1244 05/09/21 1504   05/03/21 1300  cefTRIAXone (ROCEPHIN) 1 g in sodium chloride 0.9 % 100 mL IVPB  Status:  Discontinued        1 g 200 mL/hr over 30 Minutes Intravenous Every 24 hours 05/03/21 1210 05/03/21 1244   05/01/21 2300  cefTRIAXone (ROCEPHIN) 1 g in sodium chloride 0.9 % 100 mL IVPB  Status:  Discontinued        1 g 200 mL/hr over 30 Minutes Intravenous Every 24 hours 05/01/21 1957 05/02/21 1102   05/01/21 2300  oseltamivir (TAMIFLU) capsule 75 mg  Status:  Discontinued        75 mg Per Tube 2 times daily 05/01/21 2221 05/03/21 1048   05/01/21 2200  ceFEPIme (MAXIPIME) 2 g in sodium chloride 0.9 % 100 mL IVPB  Status:  Discontinued        2 g 200 mL/hr over 30 Minutes Intravenous Every 8 hours 05/01/21 1346 05/01/21 1957   05/01/21 2200  oseltamivir (TAMIFLU) 6 MG/ML suspension 75 mg  Status:  Discontinued        75 mg Per Tube 2 times daily 05/01/21 1957 05/01/21 2221   05/01/21 2100  azithromycin (ZITHROMAX) 500 mg in sodium chloride 0.9 % 250 mL IVPB  Status:  Discontinued        500 mg 250 mL/hr over 60 Minutes Intravenous Every 24 hours 05/01/21 1957  05/04/21 1109   05/01/21 1400  doxycycline (VIBRAMYCIN) 100 mg in sodium chloride 0.9 % 250 mL IVPB  Status:  Discontinued        100 mg 125 mL/hr over 120 Minutes Intravenous Every 12 hours 05/01/21 1336 05/01/21 2018   05/01/21 1345  ceFEPIme (MAXIPIME) 2 g in sodium chloride 0.9 % 100 mL IVPB        2 g 200 mL/hr over 30 Minutes Intravenous  Once 05/01/21 1336 05/01/21 1804         DVT prophylaxis: Apixaban  Code Status: Full code  Family Communication: No family at bedside   Consultants: PCCM Gastroenterology  Procedures:     Objective    Physical Examination:   General-appears in no acute distress Heart-S1-S2, regular, no murmur auscultated Lungs-clear to auscultation bilaterally, no wheezing or crackles auscultated Abdomen-soft, nontender, no organomegaly Extremities-no edema in the lower extremities Neuro-alert, oriented x3, no focal deficit noted  Status is: Inpatient  Dispo: The patient is from: Home              Anticipated d/c is to: Home              Anticipated d/c date is: 05/20/2021              Patient currently not stable for discharge  Barrier to discharge-ongoing evaluation for GI bleed  COVID-19 Labs  No results for input(s): DDIMER, FERRITIN, LDH, CRP in the last 72 hours.  Lab Results  Component Value Date   SARSCOV2NAA NEGATIVE 05/01/2021   SARSCOV2NAA POSITIVE (A) 12/21/2020   [place] NEGATIVE 09/25/2020   [place] NEGATIVE 07/18/2020            No results found for this or any previous visit (from the past 240 hour(s)).  Oswald Hillock   [facility] If 7PM-7AM, please contact night-coverage at [email or web address], Office  [phone]   05/17/2021, 3:44 PM  LOS: 16 days

## 2021-05-17 NOTE — Progress Notes (Signed)
Occupational Therapy Treatment Patient Details Name: Ronald [name] MRN: [id number] DOB: [birth year] Today's Date: 05/17/2021   History of present illness 75 y/o male who presented on 11/23 w/ SOB with acute respiratory failure requiring intubation in ED, unsuccessful extubation 11/25. Pt successfully extubated on 12/1 with some difficulty tolerating BiPAP. PMHx: COPD, tracheal stenosis, chronic respiratory failure on 2L, CAD, DM II, HTN, HLD   OT comments  Pt confused, A & O x3, not oriented to situation. Pt min - max A +2 for bed mobility, due to RLE pain. Pt max A +2 for transfers, pt unable to stand upright and RR elevating into the 50's. Pt set up - max A for ADLs. Pt limited by impaired cognition, balance, activity tolerance, ROM, and strength at this time. Will continue to follow in the acute setting. Recommend SNF at d/c.   Recommendations for follow up therapy are one component of a multi-disciplinary discharge planning process, led by the attending physician.  Recommendations may be updated based on patient status, additional functional criteria and insurance authorization.    Follow Up Recommendations  Skilled nursing-short term rehab (<3 hours/day)    Assistance Recommended at Discharge Frequent or constant Supervision/Assistance  Equipment Recommendations  Other (comment);None recommended by OT (TBD at next venue of care)    Recommendations for Other Services PT consult    Precautions / Restrictions Precautions Precautions: Fall Precaution Comments: cortrak Restrictions Weight Bearing Restrictions: No       Mobility Bed Mobility Overal bed mobility: Needs Assistance Bed Mobility: Sit to Supine;Rolling;Sidelying to Sit Rolling: Mod assist Sidelying to sit: Max assist;HOB elevated Supine to sit: Min assist;HOB elevated Sit to supine: Mod assist;+2 for physical assistance   General bed mobility comments: Step by step cues for sequencing/technique, use of rail, assist  with scooting bottom, RLE and trunk toget upright, limited upright tolerance, leaning on left forearm on bed due to weaknes/fatigue.    Transfers Overall transfer level: Needs assistance Equipment used: Rolling walker (2 wheels) Transfers: Sit to/from Stand Sit to Stand: Max assist;+2 physical assistance;From elevated surface           General transfer comment: Attempted to stand but minimal effort provided, despite assist of 2 and elevated bed height. RR up to 50s, irregular HR.     Balance Overall balance assessment: Needs assistance Sitting-balance support: Feet supported;Single extremity supported Sitting balance-Leahy Scale: Poor Sitting balance - Comments: uses bedrail for support after a few mins Postural control: Left lateral lean                                 ADL either performed or assessed with clinical judgement   ADL Overall ADL's : Needs assistance/impaired Eating/Feeding: Set up;Sitting   Grooming: Set up;Sitting   Upper Body Bathing: Moderate assistance;Sitting;Minimal assistance       Upper Body Dressing : Minimal assistance;Sitting;Moderate assistance   Lower Body Dressing: Maximal assistance;Sitting/lateral leans                      Extremity/Trunk Assessment Upper Extremity Assessment Upper Extremity Assessment: Generalized weakness RUE Deficits / Details: edema LUE Deficits / Details: edema   Lower Extremity Assessment Lower Extremity Assessment: Defer to PT evaluation        Vision   Vision Assessment?: No apparent visual deficits   Perception Perception Perception: Not tested   Praxis Praxis Praxis: Not tested    Cognition Arousal/Alertness: Awake/alert  Behavior During Therapy: WFL for tasks assessed/performed Overall Cognitive Status: Impaired/Different from baseline Area of Impairment: Orientation;Memory;Following commands;Awareness;Attention;Safety/judgement;Problem solving                  Orientation Level: Disoriented to;Situation Current Attention Level: Sustained Memory: Decreased short-term memory Following Commands: Follows one step commands inconsistently;Follows one step commands with increased time Safety/Judgement: Decreased awareness of safety;Decreased awareness of deficits Awareness: Intellectual Problem Solving: Slow processing;Difficulty sequencing;Requires verbal cues;Decreased initiation General Comments: Oriented to self and month/year but not to situation. "Can you go in my front room and get my phone and glasses?" "I got up earlier, see that truck over there." Hoarse voice difficult to understand at times. Repetition needed to follow commands. poor awareness of deficits/safety. Confused. Refusing to use RW, "I don't need that," refusing the gait belt.          Exercises Other Exercises Other Exercises: Sitting up from leaning on left foream x3 during session with Mod A.   Shoulder Instructions       General Comments BP taken throughout session (134/39) prior to standing attempt, 151/57 after standing attempt, and 156/48 once supine in bed. SpO2 stable on 3L O2 via [place], irregular HR at times    Pertinent Vitals/ Pain       Pain Assessment: Faces Pain Score: 4  Faces Pain Scale: Hurts little more Pain Location: Right thigh/leg Pain Descriptors / Indicators: Discomfort;Sore;Grimacing Pain Intervention(s): Monitored during session;Repositioned;Limited activity within patient's tolerance  Home Living                                          Prior Functioning/Environment              Frequency  Min 2X/week        Progress Toward Goals  OT Goals(current goals can now be found in the care plan section)  Progress towards OT goals: Progressing toward goals  Acute Rehab OT Goals Patient Stated Goal: none stated OT Goal Formulation: With patient Time For Goal Achievement: 05/28/21 Potential to Achieve Goals: Fair ADL  Goals Pt Will Perform Grooming: sitting;with set-up Pt Will Perform Lower Body Dressing: sit to/from stand;with mod assist;with adaptive equipment;sitting/lateral leans Pt Will Transfer to Toilet: bedside commode;stand pivot transfer;with mod assist Pt/caregiver will Perform Home Exercise Program: Increased strength;Both right and left upper extremity;With written HEP provided Additional ADL Goal #1: Pt will follow 1-2 step command with 90% accuracy and increased time.  Plan Discharge plan remains appropriate;Frequency remains appropriate    Co-evaluation    PT/OT/SLP Co-Evaluation/Treatment: Yes Reason for Co-Treatment: Complexity of the patient's impairments (multi-system involvement);Necessary to address cognition/behavior during functional activity;For patient/therapist safety PT goals addressed during session: Mobility/safety with mobility OT goals addressed during session: ADL's and self-care      AM-PAC OT "6 Clicks" Daily Activity     Outcome Measure   Help from another person eating meals?: A Little Help from another person taking care of personal grooming?: A Little Help from another person toileting, which includes using toliet, bedpan, or urinal?: Total Help from another person bathing (including washing, rinsing, drying)?: Total Help from another person to put on and taking off regular upper body clothing?: A Lot Help from another person to put on and taking off regular lower body clothing?: Total 6 Click Score: 11    End of Session Equipment Utilized During Treatment: Gait belt;Oxygen  OT  Visit Diagnosis: Other abnormalities of gait and mobility (R26.89);Muscle weakness (generalized) (M62.81);Other symptoms and signs involving cognitive function   Activity Tolerance Patient tolerated treatment well   Patient Left with call bell/phone within reach;in bed;with bed alarm set   Nurse Communication Mobility status;Other (comment) (RN notified pt's catheter fell out  during session)        Time: 3143-8887 OT Time Calculation (min): 31 min  Charges: OT General Charges $OT Visit: 1 Visit OT Treatments $Self Care/Home Management : 8-22 mins  Lynnda Child, OTD, OTR/L Acute Rehab [phone]) [phone]   Kaylyn Lim 05/17/2021, 3:20 PM

## 2021-05-17 NOTE — Progress Notes (Signed)
Patient c/o of not able to move his right leg. Large bruise noted to his inner rt. upper thigh/groin area. On coming RN was notified to follow up with MD.

## 2021-05-17 NOTE — Progress Notes (Signed)
Physical Therapy Treatment Patient Details Name: Ronald [name] MRN: [id number] DOB: 04/04/1946 Today's Date: 05/17/2021   History of Present Illness 75 y/o male who presented on 11/23 w/ SOB with acute respiratory failure requiring intubation in ED, unsuccessful extubation 11/25. Pt successfully extubated on 12/1 with some difficulty tolerating BiPAP. PMHx: COPD, tracheal stenosis, chronic respiratory failure on 2L, CAD, DM II, HTN, HLD    PT Comments    Patient not progressing with mobility this date. Continues to be confused, A&Ox3. Noted to have poor attention and awareness to deficits/safety. Pt with slow processing and needs repetition to follow commands/with sequencing. Requires assist of 2 for all mobility. Fatigues quickly needing frequent rest breaks. RR up to 50 sitting EOB and needs support of LUE leaning on bed when tired. Attempted standing and minimal effort given despite assist of 2. High fall risk. Continues to be appropriate for SNF. Will follow.   Recommendations for follow up therapy are one component of a multi-disciplinary discharge planning process, led by the attending physician.  Recommendations may be updated based on patient status, additional functional criteria and insurance authorization.  Follow Up Recommendations  Skilled nursing-short term rehab (<3 hours/day)     Assistance Recommended at Discharge Frequent or constant Supervision/Assistance  Equipment Recommendations  Rolling walker (2 wheels);BSC/3in1    Recommendations for Other Services       Precautions / Restrictions Precautions Precautions: Fall;Other (comment) Precaution Comments: cortrak Restrictions Weight Bearing Restrictions: No     Mobility  Bed Mobility Overal bed mobility: Needs Assistance Bed Mobility: Sit to Supine;Rolling;Sidelying to Sit Rolling: Mod assist Sidelying to sit: Max assist;HOB elevated   Sit to supine: Mod assist;+2 for physical assistance   General bed mobility  comments: Step by step cues for sequencing/technique, use of rail, assist with scooting bottom, RLE and trunk toget upright, limited upright tolerance, leaning on left forearm on bed due to weaknes/fatigue.    Transfers Overall transfer level: Needs assistance Equipment used: Rolling walker (2 wheels) Transfers: Sit to/from Stand Sit to Stand: Max assist;+2 physical assistance;From elevated surface           General transfer comment: Attempted to stand but minimal effort provided, despite assist of 2 and elevated bed height. RR up to 50s, irregular HR.    Ambulation/Gait               General Gait Details: unable   Stairs             Wheelchair Mobility    Modified Rankin (Stroke Patients Only)       Balance Overall balance assessment: Needs assistance Sitting-balance support: Feet supported;Single extremity supported Sitting balance-Leahy Scale: Poor Sitting balance - Comments: Able to sit EOB for a few mins unsupported but fatigues resulting in left lateral lean onto bed for support. Postural control: Left lateral lean                                  Cognition Arousal/Alertness: Awake/alert   Overall Cognitive Status: Impaired/Different from baseline Area of Impairment: Orientation;Memory;Following commands;Awareness;Attention;Safety/judgement;Problem solving                 Orientation Level: Disoriented to;Situation Current Attention Level: Sustained Memory: Decreased short-term memory Following Commands: Follows one step commands inconsistently;Follows one step commands with increased time Safety/Judgement: Decreased awareness of safety;Decreased awareness of deficits Awareness: Intellectual Problem Solving: Slow processing;Difficulty sequencing;Requires verbal cues;Decreased initiation General Comments: Oriented to  self and month/year but not to situation. "Can you go in my front room and get my phone and glasses?" "I got up  earlier, see that truck iover there." Hoarse voice difficult to understand at times. Repetition needed to follow commands. poor awareness of deficits/safety. Confused. Refusing to use RW, "I don't need that," refusing the gait belt.        Exercises Other Exercises Other Exercises: Sitting up from leaning on left foream x3 during session with Mod A.    General Comments General comments (skin integrity, edema, etc.): BP stable during session, asymptomatic. HR irrregular. Sp02 stable on 3L.min 02 [place].      Pertinent Vitals/Pain Pain Assessment: Faces Faces Pain Scale: Hurts little more Pain Location: Right thigh/leg Pain Descriptors / Indicators: Discomfort;Sore;Grimacing Pain Intervention(s): Monitored during session;Repositioned;Limited activity within patient's tolerance    Home Living                          Prior Function            PT Goals (current goals can now be found in the care plan section) Progress towards PT goals: Not progressing toward goals - comment (SOB, confusion, fatigue)    Frequency    Min 3X/week      PT Plan Current plan remains appropriate    Co-evaluation PT/OT/SLP Co-Evaluation/Treatment: Yes Reason for Co-Treatment: Necessary to address cognition/behavior during functional activity;Complexity of the patient's impairments (multi-system involvement);To address functional/ADL transfers;For patient/therapist safety PT goals addressed during session: Mobility/safety with mobility        AM-PAC PT "6 Clicks" Mobility   Outcome Measure  Help needed turning from your back to your side while in a flat bed without using bedrails?: A Lot Help needed moving from lying on your back to sitting on the side of a flat bed without using bedrails?: A Lot Help needed moving to and from a bed to a chair (including a wheelchair)?: Total Help needed standing up from a chair using your arms (e.g., wheelchair or bedside chair)?: Total Help needed to  walk in hospital room?: Total Help needed climbing 3-5 steps with a railing? : Total 6 Click Score: 8    End of Session Equipment Utilized During Treatment: Gait belt;Oxygen Activity Tolerance: Patient limited by fatigue;Patient limited by pain Patient left: in bed;with call bell/phone within reach;with bed alarm set;with SCD's reapplied Nurse Communication: Mobility status;Need for lift equipment PT Visit Diagnosis: Other abnormalities of gait and mobility (R26.89);Difficulty in walking, not elsewhere classified (R26.2);Muscle weakness (generalized) (M62.81);Pain Pain - Right/Left: Right Pain - part of body: Leg     Time: 6761-9509 PT Time Calculation (min) (ACUTE ONLY): 31 min  Charges:  $Therapeutic Activity: 8-22 mins                     Marisa Severin, PT, DPT Acute Rehabilitation Services Pager [phone] Office [phone]      [name] 05/17/2021, 2:37 PM

## 2021-05-17 NOTE — Care Management Important Message (Signed)
Important Message  Patient Details  Name: Ronald [name] MRN: [id number] Date of Birth: [birth year]   Medicare Important Message Given:  Yes     Hannah Beat 05/17/2021, 12:44 PM

## 2021-05-17 NOTE — Progress Notes (Signed)
Pt. Has NG tube. Will not be wearing cpap tonight.

## 2021-05-17 NOTE — Progress Notes (Addendum)
[facility] Progress Note  CC:  GI bleed  Subjective:  No further bleeding per patient and nothing reported by nursing staff.  "A little" abdominal pain.  He wants to go home.  Reports leg pain--has a large bruise on his inner right thigh per nurse report.  Primary MD being made aware.  Objective:  Vital signs in last 24 hours: Temp:  [98 F (36.7 C)-98.6 F (37 C)] 98 F (36.7 C) (12/09 0808) Pulse Rate:  [78-137] 121 (12/09 0824) Resp:  [0-36] 18 (12/09 0808) BP: (115-175)/(49-107) 175/67 (12/09 0808) SpO2:  [91 %-98 %] 96 % (12/09 0858) Last BM Date: 05/16/21 General:  Alert, chronically ill-appearing, in NAD Heart:  Tachycardic. Pulm:  Coarse BS noted B/L. Abdomen:  Soft, non-distended.  BS present.  Non-tender.  Intake/Output from previous day: 12/08 0701 - 12/09 0700 In: 1414.3 [P.O.:330; I.V.:34.3; NG/GT:1050] Out: 1300 [Urine:1300] Intake/Output this shift: Total I/O In: -  Out: 300 [Urine:300]  Lab Results: Recent Labs    05/16/21 0120 05/16/21 1024 05/16/21 1321 05/16/21 2109 05/17/21 0047  WBC 11.1*  --   --  13.0* 12.2*  HGB 8.0*   < > 9.1* 8.7* 8.3*  HCT 25.5*   < > 28.4* 26.7* 25.2*  PLT 296  --   --  328 331   < > = values in this interval not displayed.   BMET Recent Labs    05/16/21 0120 05/16/21 1024 05/16/21 1321 05/17/21 0047  NA 134* 137 139 135  K 5.5* 4.8 5.3* 5.3*  CL 97*  --  98 97*  CO2 31  --  35* 32  GLUCOSE 285*  --  114* 97  BUN 39*  --  39* 43*  CREATININE 0.93  --  1.00 1.32*  CALCIUM 8.2*  --  8.4* 8.2*   LFT Recent Labs    05/17/21 0047  PROT 4.9*  ALBUMIN 2.2*  AST 15  ALT 23  ALKPHOS 56  BILITOT 1.3*   DG CHEST PORT 1 VIEW  Result Date: 05/16/2021 CLINICAL DATA:  Respiratory distress EXAM: PORTABLE CHEST 1 VIEW COMPARISON:  11:08 a.m. FINDINGS: The lungs are symmetrically hyperinflated in keeping with changes of underlying COPD. Focal consolidations again seen within the retrocardiac  region possibly reflecting lobar pneumonia or aspiration in the acute setting. Cardiac size is mildly enlarged, unchanged. Pulmonary vascularity is normal. No pneumothorax or pleural effusion. Nasoenteric feeding tube extends into the upper abdomen beyond the margin of the examination. IMPRESSION: Stable retrocardiac consolidation, possibly reflecting infection or aspiration in the appropriate clinical setting. COPD. Stable cardiomegaly Electronically Signed   By: Fidela Salisbury M.D.   On: 05/16/2021 04:07   DG CHEST PORT 1 VIEW  Result Date: 05/15/2021 CLINICAL DATA:  Shortness of breath EXAM: PORTABLE CHEST 1 VIEW COMPARISON:  Previous studies including the examination of 05/10/2021 FINDINGS: Transverse diameter of heart is increased. There are no signs of pulmonary edema. There is peribronchial thickening. There is increased density in the left lower lung fields. There is blunting of both lateral CP angles. There is no pneumothorax. Enteric tube is noted traversing the esophagus. Distal portion of enteric tube is not visualized. IMPRESSION: Cardiomegaly. There are no signs of pulmonary edema or focal pulmonary consolidation. Increased markings are seen in the left lower lung fields suggesting atelectasis/pneumonitis in the left lower lobe. There is blunting of both lateral CP angles suggesting small bilateral pleural effusions. Electronically Signed   By: Elmer Picker M.D.   On:  05/15/2021 13:03    Assessment / Plan: *GI bleed:  Had two large bloody BMs this AM.  ? Diverticular.  ? If he could have developed some ischemic colitis with his hypotension.  Just had colonoscopy earlier this year.  No other bloody stools since those episodes yesterday, 12/8. *Anemia, normocytic:  Hgb was 14.5 grams 11 days ago.  Then 8.0 grams this AM, but repeats showed 10.2 grams and 9.1 grams.  Today it is 8.3 grams. *Acute on chronic hypoxic and hypercapnic respiratory failure with COPD and history of tracheal  stenosis.  No longer intubated. *Agitation requiring sedation:  Not sedated at this time. *Reported history of UC in 2000 but not on any medication and no evidence of active disease on colonoscopy in 07/2020.  -Continue to observe and monitor Hgb. -CTA if he bleeds briskly.    LOS: 16 days   Ronald [name]  05/17/2021, 11:01 AM  I have discussed the case with the APP, and that is the plan I formulated. I personally interviewed and examined the patient.  Patient seems somewhat confused today, keeps asking me questions about rehab and why he cannot do that at home.  Seems to deny abdominal pain.  No additional lower GI bleeding since reports from yesterday morning.  Hemoglobin stable from yesterday.  So far self-limited hematochezia, drop in hemoglobin in the setting of acute on chronic severe respiratory illness.  No current plans for GI endoscopic evaluation given sedation risks and severity of patient's respiratory illness and cessation of bleeding.  He can have a regular diet.  Dobbhoff tube is still in place and can be used if needed.  Our service is signing off, please call to see him again if needed.  Nelida Meuse III Office: [phone]

## 2021-05-18 LAB — GLUCOSE, CAPILLARY
Glucose-Capillary: 178 mg/dL — ABNORMAL HIGH (ref 70–99)
Glucose-Capillary: 179 mg/dL — ABNORMAL HIGH (ref 70–99)
Glucose-Capillary: 139 mg/dL — ABNORMAL HIGH (ref 70–99)
Glucose-Capillary: 84 mg/dL (ref 70–99)
Glucose-Capillary: 73 mg/dL (ref 70–99)

## 2021-05-18 LAB — COMPREHENSIVE METABOLIC PANEL
ALT: 21 U/L (ref 0–44)
AST: 14 U/L — ABNORMAL LOW (ref 15–41)
Albumin: 2.2 g/dL — ABNORMAL LOW (ref 3.5–5.0)
Alkaline Phosphatase: 57 U/L (ref 38–126)
Anion gap: 7 (ref 5–15)
BUN: 39 mg/dL — ABNORMAL HIGH (ref 8–23)
CO2: 29 mmol/L (ref 22–32)
Calcium: 7.7 mg/dL — ABNORMAL LOW (ref 8.9–10.3)
Chloride: 99 mmol/L (ref 98–111)
Creatinine, Ser: 1.13 mg/dL (ref 0.61–1.24)
GFR, Estimated: >60 mL/min (ref >60)
Glucose, Bld: 165 mg/dL — ABNORMAL HIGH (ref 70–99)
Potassium: 4.6 mmol/L (ref 3.5–5.1)
Sodium: 135 mmol/L (ref 135–145)
Total Bilirubin: 1.3 mg/dL — ABNORMAL HIGH (ref 0.3–1.2)
Total Protein: 4.8 g/dL — ABNORMAL LOW (ref 6.5–8.1)

## 2021-05-18 LAB — CBC
HCT: 24.9 % — ABNORMAL LOW (ref 39.0–52.0)
Hemoglobin: 8 g/dL — ABNORMAL LOW (ref 13.0–17.0)
MCH: 29.4 pg (ref 26.0–34.0)
MCHC: 32.1 g/dL (ref 30.0–36.0)
MCV: 91.5 fL (ref 80.0–100.0)
Platelets: 304 10*3/uL (ref 150–400)
RBC: 2.72 MIL/uL — ABNORMAL LOW (ref 4.22–5.81)
RDW: 15.1 % (ref 11.5–15.5)
WBC: 10.6 10*3/uL — ABNORMAL HIGH (ref 4.0–10.5)
nRBC: 0 % (ref 0.0–0.2)

## 2021-05-18 NOTE — Plan of Care (Signed)
  Problem: Nutrition: Goal: Adequate nutrition will be maintained Outcome: Progressing   Problem: Coping: Goal: Level of anxiety will decrease Outcome: Progressing   Problem: Elimination: Goal: Will not experience complications related to bowel motility Outcome: Progressing   Problem: Pain Managment: Goal: General experience of comfort will improve Outcome: Progressing   Problem: Safety: Goal: Ability to remain free from injury will improve Outcome: Progressing   

## 2021-05-18 NOTE — TOC Progression Note (Signed)
Transition of Care [facility]) - Progression Note    Patient Details  Name: BADR PIEDRA MRN: [id number] Date of Birth: June 02, 1946  Transition of Care [facility]) CM/SW Contact  [address] [place], [place] Phone Number: 05/18/2021, 2:07 PM  Clinical Narrative:    [facility] faxed out for bed offers, patient prefers [facility].  [address] LCSW Transition of Care [phone]    Expected Discharge Plan: [place] (vs SNF) Barriers to Discharge: Continued Medical Work up  Expected Discharge Plan and Services Expected Discharge Plan: [place] (vs SNF)   Discharge Planning Services: CM Consult   Living arrangements for the past 2 months: Single Family Home                                       Social Determinants of Health (SDOH) Interventions    Readmission Risk Interventions Readmission Risk Prevention Plan 09/28/2020  Transportation Screening Complete  HRI or Home Care Consult Complete  Social Work Consult for [place] Planning/Counseling Complete  Palliative Care Screening Not Applicable  Medication Review [employer]) Complete  Some recent data might be hidden

## 2021-05-18 NOTE — Progress Notes (Signed)
[facility]  PROGRESS NOTE  Ronald [name]:[id number] DOB: 30-Nov-1945 DOA: 05/01/2021 PCP: [name], FNP   Brief HPI:   75 year old male with past medical history of COPD, Gold stage III-IV, tracheal stenosis s/p trach in 2018, CAD s/p DES x2 to LAD in 2015, hypertension, hyperlipidemia, chronic respiratory failure on 2 L, HFpEF, diabetes mellitus type 2 presented with shortness of breath.  He was started on steroids and DuoNebs by EMS, placed on CPAP without improvement.  Patient was intubated for airway protection.  PCCM was consulted. Patient had 2 episodes of bright red blood per rectum.  GI was consulted. He was started on midodrine for hypotension  11/23 intubated resp distress and severe air trapped; transferred from [facility] to [facility] 11/24 Continued on full vent support for acute respiratory failure and amiodarone gtt and heparin gtt for Afib with RVR  11/25 Extubated. Re-intubated that night due to tachypnea 11/30 attempt at weaning 12/1 extubated 11/30, difficulty tolerating BiPAP, significant agitation 12/3 Remains on Precedex due to agitation 12/5 intermittent BiPAP nightly; continuing to require precedex for agitation 12/7 continued on precedex for agitation  Subjective   Patient seen and examined, no new complaints.  X-ray of the pelvis and hip obtained yesterday shows no acute abnormality.   Assessment/Plan:    Lower GI bleed -Patient  had 2 large bloody bowel movements yesterday -We will hold apixaban -Gastroenterology has been consulted -Had colonoscopy earlier this year which showed diverticulosis and internal hemorrhoids -No more further episodes of bleeding, likely self-limited hematochezia -GI has no further plans for any endoscopic procedures.  Acute blood loss anemia -Hemoglobin is 8.0 today -Secondary to above -Follow CBC in a.m., and transfuse for hemoglobin less than 7  Acute on chronic hypoxemic and hypercapnic respiratory  failure -Secondary to COPD exacerbation; history of tracheal stenosis -Completed steroid taper -Continue BiPAP nightly -Continue bronchodilators, Pulmicort, Brovana, DuoNeb  Acute kidney injury -Likely from hypotension, creatinine went up to 1.32 -Resolved  Atrial flutter with RVR -Heart rate improved, continue amiodarone, Coreg, digoxin -Eliquis will be held in setting of GI bleed  Acute metabolic encephalopathy -Resolved -Continue Seroquel, Xanax as needed  Diabetes mellitus type 2 -Patient on moderate sliding scale insulin with NovoLog -Patient having episodes of hypoglycemia so dose of Lantus was changed to 25 units subcu twice daily -Sliding scale was changed to sensitive  -CBGs are better  Hypotension -Patient currently on midodrine -Asymptomatic      Medications     amiodarone  100 mg Per Tube Daily   arformoterol  15 mcg Nebulization BID   aspirin  81 mg Per Tube Daily   atorvastatin  40 mg Per Tube QPM   budesonide (PULMICORT) nebulizer solution  0.5 mg Nebulization BID   carvedilol  25 mg Per Tube BID WC   Chlorhexidine Gluconate Cloth  6 each Topical Daily   digoxin  0.25 mg Per Tube Daily   docusate  100 mg Per Tube BID   escitalopram  20 mg Per Tube Daily   feeding supplement  237 mL Oral BID BM   feeding supplement (OSMOLITE 1.5 CAL)  1,000 mL Per Tube Q24H   feeding supplement (PROSource TF)  45 mL Per Tube QID   guaiFENesin  5 mL Per Tube Q6H   insulin aspart  0-9 Units Subcutaneous TID WC   insulin glargine-yfgn  25 Units Subcutaneous BID   ipratropium  0.5 mg Nebulization Q6H   isosorbide mononitrate  10 mg Per Tube BID   levalbuterol  1.25 mg Nebulization Q6H   mouth rinse  15 mL Mouth Rinse BID   melatonin  3 mg Oral QHS   midodrine  5 mg Per Tube TID WC   mirtazapine  15 mg Per Tube QHS   nicotine  14 mg Transdermal Daily   pantoprazole (PROTONIX) IV  40 mg Intravenous Q12H   polyethylene glycol  17 g Per Tube Daily   QUEtiapine  100  mg Per Tube BID     Data Reviewed:   CBG:  Recent Labs  Lab 05/17/21 2033 05/17/21 2320 05/18/21 0420 05/18/21 0841 05/18/21 1154  GLUCAP 64* 120* 178* 179* 139*    SpO2: 93 % O2 Flow Rate (L/min): 2 L/min FiO2 (%): 30 %    Vitals:   05/18/21 0417 05/18/21 0720 05/18/21 0841 05/18/21 1210  BP:   (!) 164/36 (!) 125/41  Pulse:   80 73  Resp:   18 20  Temp:   98.6 F (37 C) 98.6 F (37 C)  TempSrc:   Oral Oral  SpO2:  95% 96% 93%  Weight: 88.6 kg        Intake/Output Summary (Last 24 hours) at 05/18/2021 1356 Last data filed at 05/18/2021 0913 Gross per 24 hour  Intake 100 ml  Output 1750 ml  Net -1650 ml    12/08 1901 - 12/10 0700 In: 1100  Out: 1600 [Urine:1600]  Filed Weights   05/15/21 0500 05/16/21 0500 05/18/21 0417  Weight: 84.3 kg 84.4 kg 88.6 kg    Data Reviewed: Basic Metabolic Panel: Recent Labs  Lab 05/15/21 0256 05/16/21 0120 05/16/21 1024 05/16/21 1321 05/17/21 0047 05/18/21 0219  NA 136 134* 137 139 135 135  K 5.1 5.5* 4.8 5.3* 5.3* 4.6  CL 95* 97*  --  98 97* 99  CO2 35* 31  --  35* 32 29  GLUCOSE 248* 285*  --  114* 97 165*  BUN 37* 39*  --  39* 43* 39*  CREATININE 0.92 0.93  --  1.00 1.32* 1.13  CALCIUM 8.2* 8.2*  --  8.4* 8.2* 7.7*   Liver Function Tests: Recent Labs  Lab 05/14/21 0452 05/15/21 0256 05/16/21 0120 05/17/21 0047 05/18/21 0219  AST 28 25 18 15  14*  ALT 23 24 23 23 21   ALKPHOS 73 64 57 56 57  BILITOT 0.6 1.0 1.0 1.3* 1.3*  PROT 4.7* 4.9* 4.7* 4.9* 4.8*  ALBUMIN 2.0* 2.3* 2.2* 2.2* 2.2*   No results for input(s): LIPASE, AMYLASE in the last 168 hours. No results for input(s): AMMONIA in the last 168 hours. CBC: Recent Labs  Lab 05/15/21 0256 05/16/21 0120 05/16/21 1024 05/16/21 1321 05/16/21 2109 05/17/21 0047 05/18/21 0219  WBC 11.4* 11.1*  --   --  13.0* 12.2* 10.6*  HGB 8.9* 8.0* 10.2* 9.1* 8.7* 8.3* 8.0*  HCT 27.4* 25.5* 30.0* 28.4* 26.7* 25.2* 24.9*  MCV 89.8 90.7  --   --  89.6 89.7  91.5  PLT 275 296  --   --  328 331 304   Cardiac Enzymes: No results for input(s): CKTOTAL, CKMB, CKMBINDEX, TROPONINI in the last 168 hours. BNP (last 3 results) Recent Labs    12/21/20 1456 05/01/21 1310 05/08/21 0014  BNP 364.0* 1,654.0* 115.7*    ProBNP (last 3 results) No results for input(s): PROBNP in the last 8760 hours.  CBG: Recent Labs  Lab 05/17/21 2033 05/17/21 2320 05/18/21 0420 05/18/21 0841 05/18/21 1154  GLUCAP 64* 120* 178* 179* 139*  Radiology Reports  DG HIP PORT UNILAT WITH PELVIS 1V RIGHT  Result Date: 05/17/2021 CLINICAL DATA:  Hip pain. EXAM: DG HIP (WITH OR WITHOUT PELVIS) 1V PORT RIGHT COMPARISON:  Abdominal radiograph 05/10/2021 FINDINGS: No acute fracture or hip dislocation is identified. Right hip joint space width is preserved. A 2.5 cm dense focus in the right pelvis may reflect some residual oral contrast material from the prior study. Atherosclerotic vascular calcifications are noted. An enteric tube is partially visualized in the central abdomen. IMPRESSION: No acute osseous abnormality or significant arthropathic changes identified about the right hip. Electronically Signed   By: Logan Bores M.D.   On: 05/17/2021 14:34       Antibiotics: Anti-infectives (From admission, onward)    Start     Dose/Rate Route Frequency Ordered Stop   05/06/21 1015  oseltamivir (TAMIFLU) capsule 75 mg        75 mg Per Tube 2 times daily 05/06/21 0917 05/06/21 0937   05/05/21 2100  azithromycin (ZITHROMAX) tablet 500 mg        500 mg Per Tube Every 24 hr x 2 05/04/21 2219 05/05/21 2134   05/04/21 2100  azithromycin (ZITHROMAX) tablet 500 mg  Status:  Discontinued        500 mg Oral Every 24 hr x 2 05/04/21 1110 05/04/21 2219   05/03/21 2200  oseltamivir (TAMIFLU) capsule 30 mg  Status:  Discontinued        30 mg Per Tube 2 times daily 05/03/21 1048 05/06/21 0917   05/03/21 1330  cefTRIAXone (ROCEPHIN) 2 g in sodium chloride 0.9 % 100 mL IVPB         2 g 200 mL/hr over 30 Minutes Intravenous Every 24 hours 05/03/21 1244 05/09/21 1504   05/03/21 1300  cefTRIAXone (ROCEPHIN) 1 g in sodium chloride 0.9 % 100 mL IVPB  Status:  Discontinued        1 g 200 mL/hr over 30 Minutes Intravenous Every 24 hours 05/03/21 1210 05/03/21 1244   05/01/21 2300  cefTRIAXone (ROCEPHIN) 1 g in sodium chloride 0.9 % 100 mL IVPB  Status:  Discontinued        1 g 200 mL/hr over 30 Minutes Intravenous Every 24 hours 05/01/21 1957 05/02/21 1102   05/01/21 2300  oseltamivir (TAMIFLU) capsule 75 mg  Status:  Discontinued        75 mg Per Tube 2 times daily 05/01/21 2221 05/03/21 1048   05/01/21 2200  ceFEPIme (MAXIPIME) 2 g in sodium chloride 0.9 % 100 mL IVPB  Status:  Discontinued        2 g 200 mL/hr over 30 Minutes Intravenous Every 8 hours 05/01/21 1346 05/01/21 1957   05/01/21 2200  oseltamivir (TAMIFLU) 6 MG/ML suspension 75 mg  Status:  Discontinued        75 mg Per Tube 2 times daily 05/01/21 1957 05/01/21 2221   05/01/21 2100  azithromycin (ZITHROMAX) 500 mg in sodium chloride 0.9 % 250 mL IVPB  Status:  Discontinued        500 mg 250 mL/hr over 60 Minutes Intravenous Every 24 hours 05/01/21 1957 05/04/21 1109   05/01/21 1400  doxycycline (VIBRAMYCIN) 100 mg in sodium chloride 0.9 % 250 mL IVPB  Status:  Discontinued        100 mg 125 mL/hr over 120 Minutes Intravenous Every 12 hours 05/01/21 1336 05/01/21 2018   05/01/21 1345  ceFEPIme (MAXIPIME) 2 g in sodium chloride 0.9 % 100 mL IVPB  2 g 200 mL/hr over 30 Minutes Intravenous  Once 05/01/21 1336 05/01/21 1804         DVT prophylaxis: Apixaban  Code Status: Full code  Family Communication: No family at bedside   Consultants: PCCM Gastroenterology  Procedures:     Objective    Physical Examination:   General-appears in no acute distress Heart-S1-S2, regular, no murmur auscultated Lungs-clear to auscultation bilaterally, no wheezing or crackles  auscultated Abdomen-soft, nontender, no organomegaly Extremities-no edema in the lower extremities Neuro-alert, oriented x3, no focal deficit noted  Status is: Inpatient  Dispo: The patient is from: Home              Anticipated d/c is to: Home              Anticipated d/c date is: 05/20/2021              Patient currently not stable for discharge  Barrier to discharge-ongoing evaluation for GI bleed  COVID-19 Labs  No results for input(s): DDIMER, FERRITIN, LDH, CRP in the last 72 hours.  Lab Results  Component Value Date   SARSCOV2NAA NEGATIVE 05/01/2021   SARSCOV2NAA POSITIVE (A) 12/21/2020   [place] NEGATIVE 09/25/2020   [place] NEGATIVE 07/18/2020            No results found for this or any previous visit (from the past 240 hour(s)).  Oswald Hillock   [facility] If 7PM-7AM, please contact night-coverage at [email or web address], Office  [phone]   05/18/2021, 1:56 PM  LOS: 17 days

## 2021-05-18 NOTE — NC FL2 (Signed)
[place] LEVEL OF CARE SCREENING TOOL     IDENTIFICATION  Patient Name: Ronald [name] Birthdate: [birth year] Sex: male Admission Date (Current Location): 05/01/2021  [facility] and [place] Number:  Herbalist and Address:  [facility] [place] [place] [address], [place], [place] [address]      Provider Number:    Attending Physician Name and Address:  Oswald Hillock, MD  Relative Name and Phone Number:  Reyli Schroth [phone]    Current Level of Care: Hospital Recommended Level of Care: [place] Prior Approval Number:    Date Approved/Denied:   PASRR Number: [id number]  Discharge Plan: SNF    Current Diagnoses: Patient Active Problem List   Diagnosis Date Noted   Malnutrition of moderate degree 05/13/2021   Atrial flutter ([place]) 05/01/2021   Shock circulatory ([place]) 05/01/2021   Acute hypercapnic respiratory failure ([place]) 05/01/2021   Influenza A    Elevated brain natriuretic peptide (BNP) level 12/21/2020   Elevated troponin 12/21/2020   Hypokalemia 12/21/2020   Hyperglycemia due to diabetes mellitus ([place]) 12/21/2020   Hyperlipidemia 12/21/2020   Chest pain 05/23/2020   Controlled substance agreement signed 04/03/2020   Benzodiazepine dependence ([place]) 04/03/2020   Dysphagia 02/27/2020   Tracheal stenosis 12/09/2019   Noncompliance 04/01/2019   Lymphedema 04/01/2019   CHF (congestive heart failure) ([place]) 08/27/2018   Aortic atherosclerosis ([place]) 05/11/2017   Depression 05/05/2017   GERD (gastroesophageal reflux disease) 01/16/2017   Diabetic neuropathy ([place]) 01/16/2017   Anterolisthesis 12/04/2016   History of MI (myocardial infarction) 12/04/2016   OSA (obstructive sleep apnea) 12/04/2016   Closed fracture dislocation of lumbar spine ([place]) 07/25/2016   Closed fracture of body of sternum 07/25/2016   Multiple fractures of cervical spine ([place]) 07/25/2016   Tachycardia 07/25/2016   Tachypnea 07/25/2016    Anxiety 02/14/2016   Chronic respiratory failure with hypoxia ([place]) 07/19/2014   Essential hypertension 07/19/2014   Cigarette smoker 07/19/2014   Chest pain at rest 06/22/2014   Coronary artery disease 06/22/2014   Diabetes mellitus ([place]) 06/22/2014   COPD exacerbation ([place]) 06/22/2014   Tobacco abuse 06/22/2014   Acute encephalopathy 06/22/2014   Diabetes mellitus type 2 in obese ([place]) 05/11/2014   Simple chronic bronchitis ([place]) 05/11/2014    Orientation RESPIRATION BLADDER Height & Weight     Self, Time, Place  Normal (patient has NG tube) Incontinent Weight: 195 lb 5.2 oz (88.6 kg) Height:     BEHAVIORAL SYMPTOMS/MOOD NEUROLOGICAL BOWEL NUTRITION STATUS      Incontinent NG/panda  AMBULATORY STATUS COMMUNICATION OF NEEDS Skin   Extensive Assist Verbally Normal                       Personal Care Assistance Level of Assistance  Bathing, Feeding, Dressing Bathing Assistance: Limited assistance Feeding assistance: Limited assistance Dressing Assistance: Limited assistance     Functional Limitations Info  Sight, Hearing, Speech Sight Info: Adequate Hearing Info: Adequate Speech Info: Adequate    SPECIAL CARE FACTORS FREQUENCY  PT (By licensed PT)     PT Frequency: 5x per week OT Frequency: 5x per week            Contractures Contractures Info: Not present    Additional Factors Info  Code Status, Allergies Code Status Info: full code Allergies Info: gabapentin, metformin           Current Medications (05/18/2021):  This is the current hospital active medication list  Current Facility-Administered Medications  Medication Dose Route Frequency Provider Last Rate Last Admin   0.9 %  sodium chloride infusion  250 mL Intravenous Continuous Wynona Dove A, DO 20 mL/hr at 05/17/21 0545 250 mL at 05/17/21 0545   acetaminophen (TYLENOL) tablet 650 mg  650 mg Per Tube Q4H PRN Sherrilyn Rist A, MD   650 mg at 05/18/21 1238   ALPRAZolam (XANAX) tablet 1 mg   1 mg Per Tube BID PRN Harvie Heck, MD   1 mg at 05/16/21 0313   amiodarone (PACERONE) tablet 100 mg  100 mg Per Tube Daily Joselyn Glassman A, RPH   100 mg at 05/18/21 0847   arformoterol (BROVANA) nebulizer solution 15 mcg  15 mcg Nebulization BID Harvie Heck, MD   15 mcg at 05/18/21 0938   aspirin chewable tablet 81 mg  81 mg Per Tube Daily Olalere, Adewale A, MD   81 mg at 05/18/21 0847   atorvastatin (LIPITOR) tablet 40 mg  40 mg Per Tube QPM Olalere, Adewale A, MD   40 mg at 05/17/21 1815   bisacodyl (DULCOLAX) suppository 10 mg  10 mg Rectal Daily PRN Omar Person, NP       budesonide (PULMICORT) nebulizer solution 0.5 mg  0.5 mg Nebulization BID Renee Pain, MD   0.5 mg at 05/18/21 0720   carvedilol (COREG) tablet 25 mg  25 mg Per Tube BID WC Pierce, Dwayne A, RPH   25 mg at 05/18/21 0846   Chlorhexidine Gluconate Cloth 2 % PADS 6 each  6 each Topical Daily Margaretmary Lombard, MD   6 each at 05/18/21 0848   digoxin (LANOXIN) tablet 0.25 mg  0.25 mg Per Tube Daily Mannam, Praveen, MD   0.25 mg at 05/18/21 0847   docusate (COLACE) 50 MG/5ML liquid 100 mg  100 mg Per Tube BID Mannam, Praveen, MD   100 mg at 05/17/21 2049   escitalopram (LEXAPRO) tablet 20 mg  20 mg Per Tube Daily Olalere, Adewale A, MD   20 mg at 05/18/21 0847   feeding supplement (ENSURE ENLIVE / ENSURE PLUS) liquid 237 mL  237 mL Oral BID BM Mannam, Praveen, MD   237 mL at 05/18/21 1239   feeding supplement (OSMOLITE 1.5 CAL) liquid 1,000 mL  1,000 mL Per Tube Q24H Mannam, Praveen, MD 50 mL/hr at 05/17/21 2200 1,000 mL at 05/17/21 2200   feeding supplement (PROSource TF) liquid 45 mL  45 mL Per Tube QID Mannam, Praveen, MD   45 mL at 05/18/21 0557   fentaNYL (SUBLIMAZE) injection 25 mcg  25 mcg Intravenous Q15 min PRN Mannam, Praveen, MD       fentaNYL (SUBLIMAZE) injection 25-100 mcg  25-100 mcg Intravenous Q30 min PRN Mannam, Praveen, MD       fentaNYL (SUBLIMAZE) injection 50 mcg  50 mcg Intravenous Q2H PRN Olalere,  Adewale A, MD   50 mcg at 05/16/21 0033   guaiFENesin (ROBITUSSIN) 100 MG/5ML liquid 5 mL  5 mL Per Tube Q6H Olalere, Adewale A, MD   5 mL at 05/18/21 1238   hydrALAZINE (APRESOLINE) injection 10 mg  10 mg Intravenous Q4H PRN Anders Simmonds, MD   10 mg at 05/09/21 0048   insulin aspart (novoLOG) injection 0-9 Units  0-9 Units Subcutaneous TID WC Oswald Hillock, MD   1 Units at 05/18/21 1229   insulin glargine-yfgn (SEMGLEE) injection 25 Units  25 Units Subcutaneous BID Oswald Hillock, MD   25 Units at 05/18/21 1229  ipratropium (ATROVENT) nebulizer solution 0.5 mg  0.5 mg Nebulization Q6H Mannam, Praveen, MD   0.5 mg at 05/18/21 2956   isosorbide mononitrate (ISMO) tablet 10 mg  10 mg Per Tube BID Margaretha Seeds, MD   10 mg at 05/18/21 0846   levalbuterol (XOPENEX) nebulizer solution 1.25 mg  1.25 mg Nebulization Q6H Mannam, Praveen, MD   1.25 mg at 05/18/21 0721   MEDLINE mouth rinse  15 mL Mouth Rinse BID Olalere, Adewale A, MD   15 mL at 05/18/21 0849   melatonin tablet 3 mg  3 mg Oral QHS Aslam, Loralyn [place], MD   3 mg at 05/17/21 2055   midodrine (PROAMATINE) tablet 5 mg  5 mg Per Tube TID WC Pierce, Dwayne A, RPH   5 mg at 05/18/21 1229   mirtazapine (REMERON) tablet 15 mg  15 mg Per Tube QHS Olalere, Adewale A, MD   15 mg at 05/17/21 2049   nicotine (NICODERM CQ - dosed in mg/24 hours) patch 14 mg  14 mg Transdermal Daily Olalere, Adewale A, MD   14 mg at 05/18/21 0847   ondansetron (ZOFRAN) injection 4 mg  4 mg Intravenous Q6H PRN Renee Pain, MD       pantoprazole (PROTONIX) injection 40 mg  40 mg Intravenous Q12H Mannam, Praveen, MD   40 mg at 05/18/21 0847   polyethylene glycol (MIRALAX / GLYCOLAX) packet 17 g  17 g Per Tube Daily Mannam, Praveen, MD       QUEtiapine (SEROQUEL) tablet 100 mg  100 mg Per Tube BID Aslam, Sadia, MD   100 mg at 05/18/21 0847   sennosides (SENOKOT) 8.8 MG/5ML syrup 5 mL  5 mL Per Tube BID PRN Laurin Coder, MD         Discharge Medications: Please  see discharge summary for a list of discharge medications.  Relevant Imaging Results:  Relevant Lab Results:   Additional Information SS# [id number]  Elliot Gurney [place], [place]

## 2021-05-18 NOTE — Progress Notes (Signed)
Patient declined CPAP at this time. Also has NG tube in nose. Will hold CPAP at this time.

## 2021-05-19 LAB — GLUCOSE, CAPILLARY
Glucose-Capillary: 127 mg/dL — ABNORMAL HIGH (ref 70–99)
Glucose-Capillary: 139 mg/dL — ABNORMAL HIGH (ref 70–99)
Glucose-Capillary: 140 mg/dL — ABNORMAL HIGH (ref 70–99)
Glucose-Capillary: 140 mg/dL — ABNORMAL HIGH (ref 70–99)
Glucose-Capillary: 150 mg/dL — ABNORMAL HIGH (ref 70–99)
Glucose-Capillary: 157 mg/dL — ABNORMAL HIGH (ref 70–99)
Glucose-Capillary: 99 mg/dL (ref 70–99)

## 2021-05-19 LAB — CBC
HCT: 23.9 % — ABNORMAL LOW (ref 39.0–52.0)
Hemoglobin: 7.6 g/dL — ABNORMAL LOW (ref 13.0–17.0)
MCH: 28.9 pg (ref 26.0–34.0)
MCHC: 31.8 g/dL (ref 30.0–36.0)
MCV: 90.9 fL (ref 80.0–100.0)
Platelets: 276 10*3/uL (ref 150–400)
RBC: 2.63 MIL/uL — ABNORMAL LOW (ref 4.22–5.81)
RDW: 15.8 % — ABNORMAL HIGH (ref 11.5–15.5)
WBC: 9 10*3/uL (ref 4.0–10.5)
nRBC: 0 % (ref 0.0–0.2)

## 2021-05-19 LAB — COMPREHENSIVE METABOLIC PANEL
ALT: 19 U/L (ref 0–44)
AST: 15 U/L (ref 15–41)
Albumin: 2 g/dL — ABNORMAL LOW (ref 3.5–5.0)
Alkaline Phosphatase: 54 U/L (ref 38–126)
Anion gap: 8 (ref 5–15)
BUN: 34 mg/dL — ABNORMAL HIGH (ref 8–23)
CO2: 26 mmol/L (ref 22–32)
Calcium: 7.8 mg/dL — ABNORMAL LOW (ref 8.9–10.3)
Chloride: 99 mmol/L (ref 98–111)
Creatinine, Ser: 1.09 mg/dL (ref 0.61–1.24)
GFR, Estimated: >60 mL/min (ref >60)
Glucose, Bld: 154 mg/dL — ABNORMAL HIGH (ref 70–99)
Potassium: 4.4 mmol/L (ref 3.5–5.1)
Sodium: 133 mmol/L — ABNORMAL LOW (ref 135–145)
Total Bilirubin: 1.6 mg/dL — ABNORMAL HIGH (ref 0.3–1.2)
Total Protein: 4.8 g/dL — ABNORMAL LOW (ref 6.5–8.1)

## 2021-05-19 MED ORDER — LEVALBUTEROL HCL 1.25 MG/0.5ML IN NEBU
1.2500 mg | INHALATION_SOLUTION | Freq: Three times a day (TID) | RESPIRATORY_TRACT | Status: DC
  Administered 2021-05-20 – 2021-05-28 (×20): 1.25 mg via RESPIRATORY_TRACT
  Filled 2021-05-19 (×27): qty 0.5

## 2021-05-19 MED ORDER — MIDODRINE HCL 5 MG PO TABS: 5.0000 mg | ORAL_TABLET | Freq: Three times a day (TID) | ORAL | Status: DC | PRN

## 2021-05-19 MED ORDER — IPRATROPIUM BROMIDE 0.02 % IN SOLN
0.5000 mg | Freq: Three times a day (TID) | RESPIRATORY_TRACT | Status: DC
Start: 2021-05-20 — End: 2021-05-28
  Administered 2021-05-20 – 2021-05-28 (×25): 0.5 mg via RESPIRATORY_TRACT
  Filled 2021-05-19 (×25): qty 2.5

## 2021-05-19 NOTE — Progress Notes (Signed)
[facility]  PROGRESS NOTE  Ronald [name] NUU:[id number] DOB: Apr 29, 1946 DOA: 05/01/2021 PCP: [name], FNP   Brief HPI:   75 year old male with past medical history of COPD, Gold stage III-IV, tracheal stenosis s/p trach in 2018, CAD s/p DES x2 to LAD in 2015, hypertension, hyperlipidemia, chronic respiratory failure on 2 L, HFpEF, diabetes mellitus type 2 presented with shortness of breath.  He was started on steroids and DuoNebs by EMS, placed on CPAP without improvement.  Patient was intubated for airway protection.  PCCM was consulted. Patient had 2 episodes of bright red blood per rectum.  GI was consulted. He was started on midodrine for hypotension  11/23 intubated resp distress and severe air trapped; transferred from [facility] to [facility] 11/24 Continued on full vent support for acute respiratory failure and amiodarone gtt and heparin gtt for Afib with RVR  11/25 Extubated. Re-intubated that night due to tachypnea 11/30 attempt at weaning 12/1 extubated 11/30, difficulty tolerating BiPAP, significant agitation 12/3 Remains on Precedex due to agitation 12/5 intermittent BiPAP nightly; continuing to require precedex for agitation 12/7 continued on precedex for agitation  Subjective   Denies any complaints.   Assessment/Plan:    Lower GI bleed -Patient  had 2 large bloody bowel movements yesterday -We will hold apixaban -Gastroenterology has been consulted -Had colonoscopy earlier this year which showed diverticulosis and internal hemorrhoids -No more further episodes of bleeding, likely self-limited hematochezia -GI has no further plans for any endoscopic procedures.   Acute blood loss anemia -Hemoglobin is 7.6 today -Secondary to above -Follow CBC in a.m., and transfuse for hemoglobin less than 7  Acute on chronic hypoxemic and hypercapnic respiratory failure -Secondary to COPD exacerbation; history of tracheal stenosis -Completed steroid taper -Continue BiPAP  nightly -Continue bronchodilators, Pulmicort, Brovana, DuoNeb  Acute kidney injury -Likely from hypotension, creatinine went up to 1.32 -Resolved -Today creatinine is 1.09  Atrial flutter with RVR -Heart rate improved, continue amiodarone, Coreg, digoxin -Eliquis will be held in setting of GI bleed  Acute metabolic encephalopathy -Resolved -Continue Seroquel, Xanax as needed  Diabetes mellitus type 2 -Patient on moderate sliding scale insulin with NovoLog -Patient having episodes of hypoglycemia so dose of Lantus was changed to 25 units subcu twice daily -Sliding scale was changed to sensitive  -CBGs are better  Hypotension -Patient currently on midodrine -Blood pressure is elevated, -We will change midodrine to as needed      Medications     amiodarone  100 mg Per Tube Daily   arformoterol  15 mcg Nebulization BID   aspirin  81 mg Per Tube Daily   atorvastatin  40 mg Per Tube QPM   budesonide (PULMICORT) nebulizer solution  0.5 mg Nebulization BID   carvedilol  25 mg Per Tube BID WC   Chlorhexidine Gluconate Cloth  6 each Topical Daily   digoxin  0.25 mg Per Tube Daily   docusate  100 mg Per Tube BID   escitalopram  20 mg Per Tube Daily   feeding supplement  237 mL Oral BID BM   feeding supplement (OSMOLITE 1.5 CAL)  1,000 mL Per Tube Q24H   feeding supplement (PROSource TF)  45 mL Per Tube QID   guaiFENesin  5 mL Per Tube Q6H   insulin aspart  0-9 Units Subcutaneous TID WC   insulin glargine-yfgn  25 Units Subcutaneous BID   ipratropium  0.5 mg Nebulization Q6H   isosorbide mononitrate  10 mg Per Tube BID   levalbuterol  1.25  mg Nebulization Q6H   mouth rinse  15 mL Mouth Rinse BID   melatonin  3 mg Oral QHS   midodrine  5 mg Per Tube TID WC   mirtazapine  15 mg Per Tube QHS   nicotine  14 mg Transdermal Daily   pantoprazole (PROTONIX) IV  40 mg Intravenous Q12H   polyethylene glycol  17 g Per Tube Daily   QUEtiapine  100 mg Per Tube BID     Data  Reviewed:   CBG:  Recent Labs  Lab 05/18/21 2038 05/19/21 0022 05/19/21 0420 05/19/21 0759 05/19/21 1158  GLUCAP 73 140* 157* 150* 127*    SpO2: 97 % O2 Flow Rate (L/min): 3 L/min FiO2 (%): 30 %    Vitals:   05/19/21 0400 05/19/21 0800 05/19/21 0910 05/19/21 1149  BP: (!) 136/40 (!) 161/41  (!) 115/57  Pulse: (!) 102   93  Resp: 11   16  Temp:  98.6 F (37 C)  98.9 F (37.2 C)  TempSrc:    Oral  SpO2: 94%  92% 97%  Weight:         Intake/Output Summary (Last 24 hours) at 05/19/2021 1423 Last data filed at 05/19/2021 1159 Gross per 24 hour  Intake 2975.83 ml  Output 1000 ml  Net 1975.83 ml    12/09 1901 - 12/11 0700 In: 3075.8  Out: 2350 [Urine:2350]  Filed Weights   05/15/21 0500 05/16/21 0500 05/18/21 0417  Weight: 84.3 kg 84.4 kg 88.6 kg    Data Reviewed: Basic Metabolic Panel: Recent Labs  Lab 05/16/21 0120 05/16/21 1024 05/16/21 1321 05/17/21 0047 05/18/21 0219 05/19/21 0141  NA 134* 137 139 135 135 133*  K 5.5* 4.8 5.3* 5.3* 4.6 4.4  CL 97*  --  98 97* 99 99  CO2 31  --  35* 32 29 26  GLUCOSE 285*  --  114* 97 165* 154*  BUN 39*  --  39* 43* 39* 34*  CREATININE 0.93  --  1.00 1.32* 1.13 1.09  CALCIUM 8.2*  --  8.4* 8.2* 7.7* 7.8*   Liver Function Tests: Recent Labs  Lab 05/15/21 0256 05/16/21 0120 05/17/21 0047 05/18/21 0219 05/19/21 0141  AST 25 18 15  14* 15  ALT 24 23 23 21 19   ALKPHOS 64 57 56 57 54  BILITOT 1.0 1.0 1.3* 1.3* 1.6*  PROT 4.9* 4.7* 4.9* 4.8* 4.8*  ALBUMIN 2.3* 2.2* 2.2* 2.2* 2.0*   No results for input(s): LIPASE, AMYLASE in the last 168 hours. No results for input(s): AMMONIA in the last 168 hours. CBC: Recent Labs  Lab 05/16/21 0120 05/16/21 1024 05/16/21 1321 05/16/21 2109 05/17/21 0047 05/18/21 0219 05/19/21 0141  WBC 11.1*  --   --  13.0* 12.2* 10.6* 9.0  HGB 8.0*   < > 9.1* 8.7* 8.3* 8.0* 7.6*  HCT 25.5*   < > 28.4* 26.7* 25.2* 24.9* 23.9*  MCV 90.7  --   --  89.6 89.7 91.5 90.9  PLT 296   --   --  328 331 304 276   < > = values in this interval not displayed.   Cardiac Enzymes: No results for input(s): CKTOTAL, CKMB, CKMBINDEX, TROPONINI in the last 168 hours. BNP (last 3 results) Recent Labs    12/21/20 1456 05/01/21 1310 05/08/21 0014  BNP 364.0* 1,654.0* 115.7*    ProBNP (last 3 results) No results for input(s): PROBNP in the last 8760 hours.  CBG: Recent Labs  Lab 05/18/21 2038 05/19/21 0022  05/19/21 0420 05/19/21 0759 05/19/21 [place]       Radiology Reports  No results found.     Antibiotics: Anti-infectives (From admission, onward)    Start     Dose/Rate Route Frequency Ordered Stop   05/06/21 1015  oseltamivir (TAMIFLU) capsule 75 mg        75 mg Per Tube 2 times daily 05/06/21 0917 05/06/21 0937   05/05/21 2100  azithromycin (ZITHROMAX) tablet 500 mg        500 mg Per Tube Every 24 hr x 2 05/04/21 2219 05/05/21 2134   05/04/21 2100  azithromycin (ZITHROMAX) tablet 500 mg  Status:  Discontinued        500 mg Oral Every 24 hr x 2 05/04/21 1110 05/04/21 2219   05/03/21 2200  oseltamivir (TAMIFLU) capsule 30 mg  Status:  Discontinued        30 mg Per Tube 2 times daily 05/03/21 1048 05/06/21 0917   05/03/21 1330  cefTRIAXone (ROCEPHIN) 2 g in sodium chloride 0.9 % 100 mL IVPB        2 g 200 mL/hr over 30 Minutes Intravenous Every 24 hours 05/03/21 1244 05/09/21 1504   05/03/21 1300  cefTRIAXone (ROCEPHIN) 1 g in sodium chloride 0.9 % 100 mL IVPB  Status:  Discontinued        1 g 200 mL/hr over 30 Minutes Intravenous Every 24 hours 05/03/21 1210 05/03/21 1244   05/01/21 2300  cefTRIAXone (ROCEPHIN) 1 g in sodium chloride 0.9 % 100 mL IVPB  Status:  Discontinued        1 g 200 mL/hr over 30 Minutes Intravenous Every 24 hours 05/01/21 1957 05/02/21 1102   05/01/21 2300  oseltamivir (TAMIFLU) capsule 75 mg  Status:  Discontinued        75 mg Per Tube 2 times daily 05/01/21 2221 05/03/21 1048   05/01/21 2200   ceFEPIme (MAXIPIME) 2 g in sodium chloride 0.9 % 100 mL IVPB  Status:  Discontinued        2 g 200 mL/hr over 30 Minutes Intravenous Every 8 hours 05/01/21 1346 05/01/21 1957   05/01/21 2200  oseltamivir (TAMIFLU) 6 MG/ML suspension 75 mg  Status:  Discontinued        75 mg Per Tube 2 times daily 05/01/21 1957 05/01/21 2221   05/01/21 2100  azithromycin (ZITHROMAX) 500 mg in sodium chloride 0.9 % 250 mL IVPB  Status:  Discontinued        500 mg 250 mL/hr over 60 Minutes Intravenous Every 24 hours 05/01/21 1957 05/04/21 1109   05/01/21 1400  doxycycline (VIBRAMYCIN) 100 mg in sodium chloride 0.9 % 250 mL IVPB  Status:  Discontinued        100 mg 125 mL/hr over 120 Minutes Intravenous Every 12 hours 05/01/21 1336 05/01/21 2018   05/01/21 1345  ceFEPIme (MAXIPIME) 2 g in sodium chloride 0.9 % 100 mL IVPB        2 g 200 mL/hr over 30 Minutes Intravenous  Once 05/01/21 1336 05/01/21 1804         DVT prophylaxis: Apixaban  Code Status: Full code  Family Communication: No family at bedside   Consultants: PCCM Gastroenterology  Procedures:     Objective    Physical Examination:   General-appears in no acute distress Heart-S1-S2, regular, no murmur auscultated Lungs-clear to auscultation bilaterally, no wheezing or crackles auscultated Abdomen-soft, nontender, no organomegaly Extremities-no edema in the lower extremities  Neuro-alert, oriented x3, no focal deficit noted  Status is: Inpatient  Dispo: The patient is from: Home              Anticipated d/c is to: Home              Anticipated d/c date is: 05/20/2021              Patient currently not stable for discharge  Barrier to discharge-ongoing evaluation for GI bleed  COVID-19 Labs  No results for input(s): DDIMER, FERRITIN, LDH, CRP in the last 72 hours.  Lab Results  Component Value Date   SARSCOV2NAA NEGATIVE 05/01/2021   SARSCOV2NAA POSITIVE (A) 12/21/2020   [place] NEGATIVE 09/25/2020    [place] NEGATIVE 07/18/2020            No results found for this or any previous visit (from the past 240 hour(s)).  Oswald Hillock   [facility] If 7PM-7AM, please contact night-coverage at [email or web address], Office  [phone]   05/19/2021, 2:23 PM  LOS: 18 days

## 2021-05-19 NOTE — Plan of Care (Signed)

## 2021-05-19 NOTE — Progress Notes (Signed)
Update provided to patient's daughter, [name].

## 2021-05-19 NOTE — Plan of Care (Signed)
  Problem: Nutrition: Goal: Adequate nutrition will be maintained Outcome: Progressing   Problem: Coping: Goal: Level of anxiety will decrease Outcome: Progressing   Problem: Elimination: Goal: Will not experience complications related to bowel motility Outcome: Progressing Goal: Will not experience complications related to urinary retention Outcome: Progressing   Problem: Safety: Goal: Ability to remain free from injury will improve Outcome: Progressing   Problem: Skin Integrity: Goal: Risk for impaired skin integrity will decrease Outcome: Progressing   

## 2021-05-20 LAB — CBC
HCT: 24.6 % — ABNORMAL LOW (ref 39.0–52.0)
Hemoglobin: 7.9 g/dL — ABNORMAL LOW (ref 13.0–17.0)
MCH: 29.8 pg (ref 26.0–34.0)
MCHC: 32.1 g/dL (ref 30.0–36.0)
MCV: 92.8 fL (ref 80.0–100.0)
Platelets: 322 10*3/uL (ref 150–400)
RBC: 2.65 MIL/uL — ABNORMAL LOW (ref 4.22–5.81)
RDW: 16.6 % — ABNORMAL HIGH (ref 11.5–15.5)
WBC: 8 10*3/uL (ref 4.0–10.5)
nRBC: 0 % (ref 0.0–0.2)

## 2021-05-20 LAB — GLUCOSE, CAPILLARY
Glucose-Capillary: 170 mg/dL — ABNORMAL HIGH (ref 70–99)
Glucose-Capillary: 140 mg/dL — ABNORMAL HIGH (ref 70–99)
Glucose-Capillary: 97 mg/dL (ref 70–99)
Glucose-Capillary: 173 mg/dL — ABNORMAL HIGH (ref 70–99)

## 2021-05-20 MED ORDER — ISOSORBIDE DINITRATE 10 MG PO TABS
10.0000 mg | ORAL_TABLET | Freq: Two times a day (BID) | ORAL | Status: DC
  Administered 2021-05-21 – 2021-06-06 (×34): 10 mg
  Filled 2021-05-20 (×36): qty 1

## 2021-05-20 NOTE — Progress Notes (Signed)
Occupational Therapy Treatment Patient Details Name: Ronald [name] MRN: [id number] DOB: Nov 08, 1945 Today's Date: 05/20/2021   History of present illness 75 y/o male who presented on 11/23 w/ SOB with acute respiratory failure requiring intubation in ED, unsuccessful extubation 11/25. Pt successfully extubated on 12/1 with some difficulty tolerating BiPAP. PMHx: COPD, tracheal stenosis, chronic respiratory failure on 2L, CAD, DM II, HTN, HLD   OT comments  Pt slowly progressing towards OT goals, not alert & oriented this session, requiring cues for orientation to self, time, place, and situation. Pt still presenting with decreased awareness of deficits and difficulty following demands. Pt requires increased time and min-mod A for UB/LB exercises during session, set up -min A for ADLs. Pt continues to be limited by impaired cognition, strength, ROM, and activity tolerance at this time. Will continue to follow acutely, recommend SNF at d/c.   Recommendations for follow up therapy are one component of a multi-disciplinary discharge planning process, led by the attending physician.  Recommendations may be updated based on patient status, additional functional criteria and insurance authorization.    Follow Up Recommendations  Skilled nursing-short term rehab (<3 hours/day)    Assistance Recommended at Discharge Frequent or constant Supervision/Assistance  Equipment Recommendations  None recommended by OT;Other (comment) (TBD at next venue of care)    Recommendations for Other Services PT consult    Precautions / Restrictions Precautions Precautions: Fall Precaution Comments: cortrak Restrictions Weight Bearing Restrictions: No       Mobility Bed Mobility Overal bed mobility: Needs Assistance Bed Mobility: Sit to Supine;Rolling;Sidelying to Sit Rolling: Mod assist         General bed mobility comments: requires increased time and directions to perfrom rolling/reaching task     Transfers                   General transfer comment: Deferred, pt refusing OOB mobility     Balance                                           ADL either performed or assessed with clinical judgement   ADL   Eating/Feeding: Minimal assistance;Sitting   Grooming: Set up;Sitting;Wash/dry face                                      Extremity/Trunk Assessment Upper Extremity Assessment Upper Extremity Assessment: Generalized weakness RUE Deficits / Details: edema RUE Sensation: WNL RUE Coordination: decreased fine motor LUE Deficits / Details: edema LUE Sensation: WNL LUE Coordination: decreased fine motor   Lower Extremity Assessment Lower Extremity Assessment: Defer to PT evaluation        Vision   Vision Assessment?: No apparent visual deficits   Perception Perception Perception: Not tested   Praxis Praxis Praxis: Not tested    Cognition Arousal/Alertness: Awake/alert Behavior During Therapy: WFL for tasks assessed/performed Overall Cognitive Status: Impaired/Different from baseline Area of Impairment: Orientation;Memory;Following commands;Awareness;Attention;Safety/judgement;Problem solving                 Orientation Level: Disoriented to;Person;Place;Time Current Attention Level: Sustained Memory: Decreased short-term memory Following Commands: Follows one step commands with increased time Safety/Judgement: Decreased awareness of safety;Decreased awareness of deficits Awareness: Intellectual Problem Solving: Slow processing;Difficulty sequencing;Requires verbal cues;Decreased initiation General Comments: pt increasingly agitated asking therapist to "stop asking questions"  Exercises Exercises: General Upper Extremity General Exercises - Upper Extremity Shoulder Flexion: AAROM;5 reps;Both Elbow Flexion: AAROM;Both;5 reps Digit Composite Flexion: AROM;Both;5 reps   Shoulder Instructions        General Comments BP taken at start of session (114/39) then after session (135/49)    Pertinent Vitals/ Pain       Pain Assessment: Faces Pain Score: 1  Faces Pain Scale: Hurts a little bit Pain Location: RLE Pain Descriptors / Indicators: Discomfort;Sore;Grimacing Pain Intervention(s): Limited activity within patient's tolerance;Monitored during session;Repositioned  Home Living                                          Prior Functioning/Environment              Frequency  Min 2X/week        Progress Toward Goals  OT Goals(current goals can now be found in the care plan section)  Progress towards OT goals: Progressing toward goals  Acute Rehab OT Goals Patient Stated Goal: none stated OT Goal Formulation: With patient Time For Goal Achievement: 05/28/21 Potential to Achieve Goals: Fair ADL Goals Pt Will Perform Grooming: sitting;with set-up Pt Will Perform Lower Body Dressing: sit to/from stand;with mod assist;with adaptive equipment;sitting/lateral leans Pt Will Transfer to Toilet: bedside commode;stand pivot transfer;with mod assist Pt/caregiver will Perform Home Exercise Program: Increased strength;Both right and left upper extremity;With written HEP provided Additional ADL Goal #1: Pt will follow 1-2 step command with 90% accuracy and increased time.  Plan Discharge plan remains appropriate;Frequency remains appropriate    Co-evaluation    PT/OT/SLP Co-Evaluation/Treatment: Yes Reason for Co-Treatment: Complexity of the patient's impairments (multi-system involvement) PT goals addressed during session: Mobility/safety with mobility OT goals addressed during session: ADL's and self-care      AM-PAC OT "6 Clicks" Daily Activity     Outcome Measure   Help from another person eating meals?: A Little Help from another person taking care of personal grooming?: A Little Help from another person toileting, which includes using toliet, bedpan,  or urinal?: Total Help from another person bathing (including washing, rinsing, drying)?: Total Help from another person to put on and taking off regular upper body clothing?: Total Help from another person to put on and taking off regular lower body clothing?: Total 6 Click Score: 10    End of Session Equipment Utilized During Treatment: Oxygen  OT Visit Diagnosis: Other abnormalities of gait and mobility (R26.89);Muscle weakness (generalized) (M62.81);Other symptoms and signs involving cognitive function Pain - Right/Left: Right Pain - part of body: Hip;Leg   Activity Tolerance Patient tolerated treatment well   Patient Left with call bell/phone within reach;in bed;with bed alarm set   Nurse Communication Mobility status        Time: 8416-6063 OT Time Calculation (min): 24 min  Charges: OT General Charges $OT Visit: 1 Visit OT Treatments $Self Care/Home Management : 8-22 mins  Lynnda Child, OTD, OTR/L Acute Rehab [phone] - [place] 05/20/2021, 11:38 AM

## 2021-05-20 NOTE — Progress Notes (Signed)
Physical Therapy Treatment Patient Details Name: Ronald [name] MRN: [id number] DOB: May 30, 1946 Today's Date: 05/20/2021   History of Present Illness 75 y/o male who presented on 11/23 w/ SOB with acute respiratory failure requiring intubation in ED, unsuccessful extubation 11/25. Pt successfully extubated on 12/1 with some difficulty tolerating BiPAP. PMHx: COPD, tracheal stenosis, chronic respiratory failure on 2L, CAD, DM II, HTN, HLD    PT Comments    Patient not progressing with mobility. Seems more confused today and not oriented to even self. Follows a few commands with increased time, repetition and tactile cues/assist. Pt with slow processing and easily irritated with questions likely deflecting because he cannot answer them. Pt poorly positioned in bed curled up on left bedrail upon arrival. Requires assist of 2 for bed mobility, rolling and repositioning. Pt refuses EOB or OOB. Performed exercises of UEs/LEs at bed level. VSS on 02. Cognition is limiting progression. Will follow.   Recommendations for follow up therapy are one component of a multi-disciplinary discharge planning process, led by the attending physician.  Recommendations may be updated based on patient status, additional functional criteria and insurance authorization.  Follow Up Recommendations  Skilled nursing-short term rehab (<3 hours/day)     Assistance Recommended at Discharge Frequent or constant Supervision/Assistance  Equipment Recommendations  Rolling walker (2 wheels);BSC/3in1    Recommendations for Other Services       Precautions / Restrictions Precautions Precautions: Fall;Other (comment) Precaution Comments: cortrak Restrictions Weight Bearing Restrictions: No     Mobility  Bed Mobility Overal bed mobility: Needs Assistance Bed Mobility: Rolling Rolling: Mod assist;+2 for physical assistance         General bed mobility comments: requires increased time and directions to perfrom  rolling/reaching task. Hands on hands assist for technique and to reach for rail.    Transfers                   General transfer comment: Deferred, pt refusing OOB mobility    Ambulation/Gait               General Gait Details: unable   Stairs             Wheelchair Mobility    Modified Rankin (Stroke Patients Only)       Balance                                            Cognition Arousal/Alertness: Awake/alert Behavior During Therapy: WFL for tasks assessed/performed Overall Cognitive Status: Impaired/Different from baseline Area of Impairment: Orientation;Attention;Memory;Following commands;Safety/judgement;Awareness;Problem solving                 Orientation Level: Disoriented to;Person;Place;Time;Situation Current Attention Level: Sustained Memory: Decreased short-term memory Following Commands: Follows one step commands with increased time (repetition) Safety/Judgement: Decreased awareness of safety;Decreased awareness of deficits Awareness: Intellectual Problem Solving: Slow processing;Difficulty sequencing;Requires verbal cues;Decreased initiation;Requires tactile cues General Comments: pt increasingly irritated asking therapist to "stop asking questions" Not able to state any orientation questions, "I don't know, you asktoo many questions." Redirectable at times, limited participation.        Exercises General Exercises - Upper Extremity Shoulder Flexion: AAROM;5 reps;Both Elbow Flexion: AAROM;Both;5 reps Digit Composite Flexion: AROM;Both;5 reps General Exercises - Lower Extremity Quad Sets: AROM;Both;10 reps;Supine Heel Slides: AAROM;Both;10 reps;Supine Straight Leg Raises: AAROM;Both;5 reps;Supine    General Comments General comments (skin integrity, edema,  etc.): BP stable during session. pre activity 114/39 and post activity 135/49.      Pertinent Vitals/Pain Pain Assessment: Faces Pain Score: 1   Faces Pain Scale: Hurts a little bit Pain Location: RLE Pain Descriptors / Indicators: Discomfort;Sore;Grimacing Pain Intervention(s): Monitored during session;Limited activity within patient's tolerance;Repositioned    Home Living                          Prior Function            PT Goals (current goals can now be found in the care plan section) Progress towards PT goals: Not progressing toward goals - comment (confusion, fatigue)    Frequency    Min 2X/week      PT Plan Frequency needs to be updated    Co-evaluation PT/OT/SLP Co-Evaluation/Treatment: Yes Reason for Co-Treatment: Complexity of the patient's impairments (multi-system involvement) PT goals addressed during session: Mobility/safety with mobility OT goals addressed during session: ADL's and self-care      AM-PAC PT "6 Clicks" Mobility   Outcome Measure  Help needed turning from your back to your side while in a flat bed without using bedrails?: A Lot Help needed moving from lying on your back to sitting on the side of a flat bed without using bedrails?: A Lot Help needed moving to and from a bed to a chair (including a wheelchair)?: Total Help needed standing up from a chair using your arms (e.g., wheelchair or bedside chair)?: Total Help needed to walk in hospital room?: Total Help needed climbing 3-5 steps with a railing? : Total 6 Click Score: 8    End of Session Equipment Utilized During Treatment: Oxygen Activity Tolerance: Patient limited by fatigue;Patient limited by pain;Other (comment) (cognition) Patient left: in bed;with call bell/phone within reach;with bed alarm set;with SCD's reapplied Nurse Communication: Mobility status;Need for lift equipment PT Visit Diagnosis: Other abnormalities of gait and mobility (R26.89);Difficulty in walking, not elsewhere classified (R26.2);Muscle weakness (generalized) (M62.81);Pain Pain - Right/Left: Right Pain - part of body: Leg     Time:  2409-7353 PT Time Calculation (min) (ACUTE ONLY): 23 min  Charges:  $Therapeutic Exercise: 8-22 mins                     Marisa Severin, PT, DPT Acute Rehabilitation Services Pager [phone] Office [phone]      [name] 05/20/2021, 12:39 PM

## 2021-05-20 NOTE — Progress Notes (Signed)
Bipap not placed on tonight due to pt having a NG tube. Pt currently on [place]

## 2021-05-20 NOTE — Progress Notes (Signed)
[facility]  PROGRESS NOTE  Ronald [name]:[id number] DOB: 1945/10/25 DOA: 05/01/2021 PCP: [name], FNP   Brief HPI:   75 year old male with past medical history of COPD, Gold stage III-IV, tracheal stenosis s/p trach in 2018, CAD s/p DES x2 to LAD in 2015, hypertension, hyperlipidemia, chronic respiratory failure on 2 L, HFpEF, diabetes mellitus type 2 presented with shortness of breath.  He was started on steroids and DuoNebs by EMS, placed on CPAP without improvement.  Patient was intubated for airway protection.  PCCM was consulted. Patient had 2 episodes of bright red blood per rectum.  GI was consulted. He was started on midodrine for hypotension  11/23 intubated resp distress and severe air trapped; transferred from [facility] to [facility] 11/24 Continued on full vent support for acute respiratory failure and amiodarone gtt and heparin gtt for Afib with RVR  11/25 Extubated. Re-intubated that night due to tachypnea 11/30 attempt at weaning 12/1 extubated 11/30, difficulty tolerating BiPAP, significant agitation 12/3 Remains on Precedex due to agitation 12/5 intermittent BiPAP nightly; continuing to require precedex for agitation 12/7 continued on precedex for agitation  Subjective   Patient denies any complaints.  No abdominal pain.   Assessment/Plan:    Lower GI bleed -Patient  had 2 large bloody bowel movements yesterday -We will hold apixaban -Gastroenterology has been consulted -Had colonoscopy earlier this year which showed diverticulosis and internal hemorrhoids -No more further episodes of bleeding, likely self-limited hematochezia -GI has no further plans for any endoscopic procedures.   Acute blood loss anemia -Hemoglobin is 7.9 today improved from 7.6 yesterday -Secondary to above -Follow CBC in a.m., and transfuse for hemoglobin less than 7  Acute on chronic hypoxemic and hypercapnic respiratory failure -Secondary to COPD exacerbation; history of  tracheal stenosis -Completed steroid taper -Continue BiPAP nightly -Continue bronchodilators, Pulmicort, Brovana, DuoNeb  Acute kidney injury -Likely from hypotension, creatinine went up to 1.32 -Resolved -Today creatinine is 1.09  Atrial flutter with RVR -Heart rate improved, continue amiodarone, Coreg, digoxin -Eliquis will be held in setting of GI bleed  Acute metabolic encephalopathy -Resolved -Continue Seroquel, Xanax as needed  Diabetes mellitus type 2 -Patient on moderate sliding scale insulin with NovoLog -Patient having episodes of hypoglycemia so dose of Lantus was changed to 25 units subcu twice daily -Sliding scale was changed to sensitive  -CBGs are better  Hypotension -Patient currently on midodrine -Blood pressure is elevated, -We will change midodrine to as needed      Medications     amiodarone  100 mg Per Tube Daily   arformoterol  15 mcg Nebulization BID   aspirin  81 mg Per Tube Daily   atorvastatin  40 mg Per Tube QPM   budesonide (PULMICORT) nebulizer solution  0.5 mg Nebulization BID   carvedilol  25 mg Per Tube BID WC   Chlorhexidine Gluconate Cloth  6 each Topical Daily   digoxin  0.25 mg Per Tube Daily   docusate  100 mg Per Tube BID   escitalopram  20 mg Per Tube Daily   feeding supplement  237 mL Oral BID BM   feeding supplement (OSMOLITE 1.5 CAL)  1,000 mL Per Tube Q24H   feeding supplement (PROSource TF)  45 mL Per Tube QID   guaiFENesin  5 mL Per Tube Q6H   insulin aspart  0-9 Units Subcutaneous TID WC   insulin glargine-yfgn  25 Units Subcutaneous BID   ipratropium  0.5 mg Nebulization TID   isosorbide mononitrate  10  mg Per Tube BID   levalbuterol  1.25 mg Nebulization TID   mouth rinse  15 mL Mouth Rinse BID   melatonin  3 mg Oral QHS   mirtazapine  15 mg Per Tube QHS   nicotine  14 mg Transdermal Daily   pantoprazole (PROTONIX) IV  40 mg Intravenous Q12H   polyethylene glycol  17 g Per Tube Daily   QUEtiapine  100 mg Per  Tube BID     Data Reviewed:   CBG:  Recent Labs  Lab 05/19/21 1605 05/19/21 2022 05/19/21 2301 05/20/21 0835 05/20/21 1116  GLUCAP 99 139* 140* 170* 140*    SpO2: 94 % O2 Flow Rate (L/min): 2 L/min FiO2 (%): 30 %    Vitals:   05/20/21 0441 05/20/21 0751 05/20/21 1300 05/20/21 1353  BP:   (!) 117/45   Pulse:      Resp:      Temp:   98.1 F (36.7 C)   TempSrc:   Oral   SpO2:  90%  94%  Weight: 88.6 kg        Intake/Output Summary (Last 24 hours) at 05/20/2021 1531 Last data filed at 05/20/2021 0601 Gross per 24 hour  Intake 1085 ml  Output 850 ml  Net 235 ml    12/10 1901 - 12/12 0700 In: 4060.8  Out: 2350 [Urine:2350]  Filed Weights   05/16/21 0500 05/18/21 0417 05/20/21 0441  Weight: 84.4 kg 88.6 kg 88.6 kg    Data Reviewed: Basic Metabolic Panel: Recent Labs  Lab 05/16/21 0120 05/16/21 1024 05/16/21 1321 05/17/21 0047 05/18/21 0219 05/19/21 0141  NA 134* 137 139 135 135 133*  K 5.5* 4.8 5.3* 5.3* 4.6 4.4  CL 97*  --  98 97* 99 99  CO2 31  --  35* 32 29 26  GLUCOSE 285*  --  114* 97 165* 154*  BUN 39*  --  39* 43* 39* 34*  CREATININE 0.93  --  1.00 1.32* 1.13 1.09  CALCIUM 8.2*  --  8.4* 8.2* 7.7* 7.8*   Liver Function Tests: Recent Labs  Lab 05/15/21 0256 05/16/21 0120 05/17/21 0047 05/18/21 0219 05/19/21 0141  AST 25 18 15  14* 15  ALT 24 23 23 21 19   ALKPHOS 64 57 56 57 54  BILITOT 1.0 1.0 1.3* 1.3* 1.6*  PROT 4.9* 4.7* 4.9* 4.8* 4.8*  ALBUMIN 2.3* 2.2* 2.2* 2.2* 2.0*   No results for input(s): LIPASE, AMYLASE in the last 168 hours. No results for input(s): AMMONIA in the last 168 hours. CBC: Recent Labs  Lab 05/16/21 2109 05/17/21 0047 05/18/21 0219 05/19/21 0141 05/20/21 0221  WBC 13.0* 12.2* 10.6* 9.0 8.0  HGB 8.7* 8.3* 8.0* 7.6* 7.9*  HCT 26.7* 25.2* 24.9* 23.9* 24.6*  MCV 89.6 89.7 91.5 90.9 92.8  PLT 328 331 304 276 322   Cardiac Enzymes: No results for input(s): CKTOTAL, CKMB, CKMBINDEX, TROPONINI in the  last 168 hours. BNP (last 3 results) Recent Labs    12/21/20 1456 05/01/21 1310 05/08/21 0014  BNP 364.0* 1,654.0* 115.7*    ProBNP (last 3 results) No results for input(s): PROBNP in the last 8760 hours.  CBG: Recent Labs  Lab 05/19/21 1605 05/19/21 2022 05/19/21 2301 05/20/21 0835 05/20/21 1116  GLUCAP 99 139* 140* 170* 140*       Radiology Reports  No results found.     Antibiotics: Anti-infectives (From admission, onward)    Start     Dose/Rate Route Frequency Ordered Stop  05/06/21 1015  oseltamivir (TAMIFLU) capsule 75 mg        75 mg Per Tube 2 times daily 05/06/21 0917 05/06/21 0937   05/05/21 2100  azithromycin (ZITHROMAX) tablet 500 mg        500 mg Per Tube Every 24 hr x 2 05/04/21 2219 05/05/21 2134   05/04/21 2100  azithromycin (ZITHROMAX) tablet 500 mg  Status:  Discontinued        500 mg Oral Every 24 hr x 2 05/04/21 1110 05/04/21 2219   05/03/21 2200  oseltamivir (TAMIFLU) capsule 30 mg  Status:  Discontinued        30 mg Per Tube 2 times daily 05/03/21 1048 05/06/21 0917   05/03/21 1330  cefTRIAXone (ROCEPHIN) 2 g in sodium chloride 0.9 % 100 mL IVPB        2 g 200 mL/hr over 30 Minutes Intravenous Every 24 hours 05/03/21 1244 05/09/21 1504   05/03/21 1300  cefTRIAXone (ROCEPHIN) 1 g in sodium chloride 0.9 % 100 mL IVPB  Status:  Discontinued        1 g 200 mL/hr over 30 Minutes Intravenous Every 24 hours 05/03/21 1210 05/03/21 1244   05/01/21 2300  cefTRIAXone (ROCEPHIN) 1 g in sodium chloride 0.9 % 100 mL IVPB  Status:  Discontinued        1 g 200 mL/hr over 30 Minutes Intravenous Every 24 hours 05/01/21 1957 05/02/21 1102   05/01/21 2300  oseltamivir (TAMIFLU) capsule 75 mg  Status:  Discontinued        75 mg Per Tube 2 times daily 05/01/21 2221 05/03/21 1048   05/01/21 2200  ceFEPIme (MAXIPIME) 2 g in sodium chloride 0.9 % 100 mL IVPB  Status:  Discontinued        2 g 200 mL/hr over 30 Minutes Intravenous Every 8 hours 05/01/21 1346  05/01/21 1957   05/01/21 2200  oseltamivir (TAMIFLU) 6 MG/ML suspension 75 mg  Status:  Discontinued        75 mg Per Tube 2 times daily 05/01/21 1957 05/01/21 2221   05/01/21 2100  azithromycin (ZITHROMAX) 500 mg in sodium chloride 0.9 % 250 mL IVPB  Status:  Discontinued        500 mg 250 mL/hr over 60 Minutes Intravenous Every 24 hours 05/01/21 1957 05/04/21 1109   05/01/21 1400  doxycycline (VIBRAMYCIN) 100 mg in sodium chloride 0.9 % 250 mL IVPB  Status:  Discontinued        100 mg 125 mL/hr over 120 Minutes Intravenous Every 12 hours 05/01/21 1336 05/01/21 2018   05/01/21 1345  ceFEPIme (MAXIPIME) 2 g in sodium chloride 0.9 % 100 mL IVPB        2 g 200 mL/hr over 30 Minutes Intravenous  Once 05/01/21 1336 05/01/21 1804         DVT prophylaxis: SCDs  Code Status: Full code  Family Communication: No family at bedside   Consultants: PCCM Gastroenterology  Procedures:     Objective    Physical Examination:   General-appears in no acute distress Heart-S1-S2, regular, no murmur auscultated Lungs-clear to auscultation bilaterally, no wheezing or crackles auscultated Abdomen-soft, nontender, no organomegaly Extremities-no edema in the lower extremities Neuro-alert, oriented x3, no focal deficit noted  Status is: Inpatient  Dispo: The patient is from: Home              Anticipated d/c is to: Home  Anticipated d/c date is: 05/22/2021              Patient currently  stable for discharge  Barrier to discharge-awaiting bed at skilled nursing facility COVID-19 Labs  No results for input(s): DDIMER, FERRITIN, LDH, CRP in the last 72 hours.  Lab Results  Component Value Date   SARSCOV2NAA NEGATIVE 05/01/2021   SARSCOV2NAA POSITIVE (A) 12/21/2020   [place] NEGATIVE 09/25/2020   [place] NEGATIVE 07/18/2020            No results found for this or any previous visit (from the past 240 hour(s)).  Oswald Hillock   [facility] If  7PM-7AM, please contact night-coverage at [email or web address], Office  [phone]   05/20/2021, 3:31 PM  LOS: 19 days

## 2021-05-21 ENCOUNTER — Ambulatory Visit: Payer: Medicare HMO | Admitting: Urology

## 2021-05-21 LAB — BASIC METABOLIC PANEL
Anion gap: 6 (ref 5–15)
BUN: 21 mg/dL (ref 8–23)
CO2: 27 mmol/L (ref 22–32)
Calcium: 7.8 mg/dL — ABNORMAL LOW (ref 8.9–10.3)
Chloride: 102 mmol/L (ref 98–111)
Creatinine, Ser: 0.93 mg/dL (ref 0.61–1.24)
GFR, Estimated: >60 mL/min (ref >60)
Glucose, Bld: 189 mg/dL — ABNORMAL HIGH (ref 70–99)
Potassium: 4.6 mmol/L (ref 3.5–5.1)
Sodium: 135 mmol/L (ref 135–145)

## 2021-05-21 LAB — CBC
HCT: 26.4 % — ABNORMAL LOW (ref 39.0–52.0)
Hemoglobin: 8.3 g/dL — ABNORMAL LOW (ref 13.0–17.0)
MCH: 29.4 pg (ref 26.0–34.0)
MCHC: 31.4 g/dL (ref 30.0–36.0)
MCV: 93.6 fL (ref 80.0–100.0)
Platelets: 318 10*3/uL (ref 150–400)
RBC: 2.82 MIL/uL — ABNORMAL LOW (ref 4.22–5.81)
RDW: 17.2 % — ABNORMAL HIGH (ref 11.5–15.5)
WBC: 7.3 10*3/uL (ref 4.0–10.5)
nRBC: 0 % (ref 0.0–0.2)

## 2021-05-21 LAB — GLUCOSE, CAPILLARY
Glucose-Capillary: 180 mg/dL — ABNORMAL HIGH (ref 70–99)
Glucose-Capillary: 196 mg/dL — ABNORMAL HIGH (ref 70–99)
Glucose-Capillary: 100 mg/dL — ABNORMAL HIGH (ref 70–99)
Glucose-Capillary: 88 mg/dL (ref 70–99)
Glucose-Capillary: 159 mg/dL — ABNORMAL HIGH (ref 70–99)

## 2021-05-21 LAB — PROTIME-INR
INR: 1.1 (ref 0.8–1.2)
Prothrombin Time: 14.3 seconds (ref 11.4–15.2)

## 2021-05-21 LAB — DIGOXIN LEVEL: Digoxin Level: 1.2 ng/mL (ref 0.8–2.0)

## 2021-05-21 NOTE — Progress Notes (Signed)
Please call pts son Noboru or daughter in [place], [place], [phone], as they are requesting an update from pts MD

## 2021-05-21 NOTE — Progress Notes (Addendum)
PROGRESS NOTE  Ronald [name] UVO:[id number] DOB: [birth year] DOA: 05/01/2021 PCP: [name], FNP  Brief History   75 year old male with past medical history of COPD, Gold stage III-IV, tracheal stenosis s/p trach in 2018, CAD s/p DES x2 to LAD in 2015, hypertension, hyperlipidemia, chronic respiratory failure on 2 L, HFpEF, diabetes mellitus type 2 presented with shortness of breath.  He was started on steroids and DuoNebs by EMS, placed on CPAP without improvement.  Patient was intubated for airway protection.  PCCM was consulted. Patient had 2 episodes of bright red blood per rectum.  GI was consulted. They evaluated the patient, but felt that colonoscopy in the setting of the patient's active cardiopulmonary comorbidities presented risks due to the need for anesthesia that were best to be avoided. They felt that the patient was not briskly bleeding at this time, and that it made sense to monitor the patient with daily CBC's. They have recommended  CTA of the abdomen as a safer and quicker option to identify the site of bleeding should he start to bleed briskly again. Hemoglobin is stable at 8.3.  He was started on midodrine for hypotension, although it appears that he has not needed it for the last 3 days.   Consultants  Gastroenterology  Procedures  None  Antibiotics   Anti-infectives (From admission, onward)    Start     Dose/Rate Route Frequency Ordered Stop   05/06/21 1015  oseltamivir (TAMIFLU) capsule 75 mg        75 mg Per Tube 2 times daily 05/06/21 0917 05/06/21 0937   05/05/21 2100  azithromycin (ZITHROMAX) tablet 500 mg        500 mg Per Tube Every 24 hr x 2 05/04/21 2219 05/05/21 2134   05/04/21 2100  azithromycin (ZITHROMAX) tablet 500 mg  Status:  Discontinued        500 mg Oral Every 24 hr x 2 05/04/21 1110 05/04/21 2219   05/03/21 2200  oseltamivir (TAMIFLU) capsule 30 mg  Status:  Discontinued        30 mg Per Tube 2 times daily 05/03/21 1048 05/06/21 0917    05/03/21 1330  cefTRIAXone (ROCEPHIN) 2 g in sodium chloride 0.9 % 100 mL IVPB        2 g 200 mL/hr over 30 Minutes Intravenous Every 24 hours 05/03/21 1244 05/09/21 1504   05/03/21 1300  cefTRIAXone (ROCEPHIN) 1 g in sodium chloride 0.9 % 100 mL IVPB  Status:  Discontinued        1 g 200 mL/hr over 30 Minutes Intravenous Every 24 hours 05/03/21 1210 05/03/21 1244   05/01/21 2300  cefTRIAXone (ROCEPHIN) 1 g in sodium chloride 0.9 % 100 mL IVPB  Status:  Discontinued        1 g 200 mL/hr over 30 Minutes Intravenous Every 24 hours 05/01/21 1957 05/02/21 1102   05/01/21 2300  oseltamivir (TAMIFLU) capsule 75 mg  Status:  Discontinued        75 mg Per Tube 2 times daily 05/01/21 2221 05/03/21 1048   05/01/21 2200  ceFEPIme (MAXIPIME) 2 g in sodium chloride 0.9 % 100 mL IVPB  Status:  Discontinued        2 g 200 mL/hr over 30 Minutes Intravenous Every 8 hours 05/01/21 1346 05/01/21 1957   05/01/21 2200  oseltamivir (TAMIFLU) 6 MG/ML suspension 75 mg  Status:  Discontinued        75 mg Per Tube 2 times daily 05/01/21 1957 05/01/21  2221   05/01/21 2100  azithromycin (ZITHROMAX) 500 mg in sodium chloride 0.9 % 250 mL IVPB  Status:  Discontinued        500 mg 250 mL/hr over 60 Minutes Intravenous Every 24 hours 05/01/21 1957 05/04/21 1109   05/01/21 1400  doxycycline (VIBRAMYCIN) 100 mg in sodium chloride 0.9 % 250 mL IVPB  Status:  Discontinued        100 mg 125 mL/hr over 120 Minutes Intravenous Every 12 hours 05/01/21 1336 05/01/21 2018   05/01/21 1345  ceFEPIme (MAXIPIME) 2 g in sodium chloride 0.9 % 100 mL IVPB        2 g 200 mL/hr over 30 Minutes Intravenous  Once 05/01/21 1336 05/01/21 1804      Subjective  The patient is resting comfortably. No new complaints.   Objective   Vitals:  Vitals:   05/21/21 1649 05/21/21 1654  BP: (!) 171/49 (!) 192/66  Pulse: 88   Resp: 14   Temp:    SpO2:     Exam:  Constitutional:  The patient is awake, alert, and oriented x 3. No acute  distress. Respiratory:  No increased work of breathing. No wheezes, rales, or rhonchi No tactile fremitus Cardiovascular:  Regular rate and rhythm No murmurs, ectopy, or gallups. No lateral PMI. No thrills. Abdomen:  Abdomen is soft, non-tender, non-distended No hernias, masses, or organomegaly Normoactive bowel sounds.  Musculoskeletal:  No cyanosis, clubbing, or edema Skin:  No rashes, lesions, ulcers palpation of skin: no induration or nodules Neurologic:  CN 2-12 intact Sensation all 4 extremities intact Psychiatric:  Mental status Mood, affect appropriate Orientation to person, place, time  judgment and insight appear intact  I have personally reviewed the following:   Today's Data  Vitals  Lab Data  CMP CBC  Micro Data    Imaging  CXR  Cardiology Data  EKG Echocardiogram  Scheduled Meds:  amiodarone  100 mg Per Tube Daily   arformoterol  15 mcg Nebulization BID   aspirin  81 mg Per Tube Daily   atorvastatin  40 mg Per Tube QPM   budesonide (PULMICORT) nebulizer solution  0.5 mg Nebulization BID   carvedilol  25 mg Per Tube BID WC   Chlorhexidine Gluconate Cloth  6 each Topical Daily   digoxin  0.25 mg Per Tube Daily   docusate  100 mg Per Tube BID   escitalopram  20 mg Per Tube Daily   feeding supplement  237 mL Oral BID BM   feeding supplement (OSMOLITE 1.5 CAL)  1,000 mL Per Tube Q24H   feeding supplement (PROSource TF)  45 mL Per Tube QID   guaiFENesin  5 mL Per Tube Q6H   insulin aspart  0-9 Units Subcutaneous TID WC   insulin glargine-yfgn  25 Units Subcutaneous BID   ipratropium  0.5 mg Nebulization TID   isosorbide dinitrate  10 mg Per Tube BID   levalbuterol  1.25 mg Nebulization TID   mouth rinse  15 mL Mouth Rinse BID   melatonin  3 mg Oral QHS   mirtazapine  15 mg Per Tube QHS   nicotine  14 mg Transdermal Daily   pantoprazole (PROTONIX) IV  40 mg Intravenous Q12H   polyethylene glycol  17 g Per Tube Daily   QUEtiapine  100 mg  Per Tube BID   Continuous Infusions:  sodium chloride Stopped (05/19/21 1600)    Principal Problem:   Acute hypercapnic respiratory failure (HCC) Active Problems:   Coronary  artery disease   COPD exacerbation (HCC)   Tracheal stenosis   Elevated troponin   Atrial flutter (HCC)   Shock circulatory (HCC)   Influenza A   Malnutrition of moderate degree   LOS: 20 days   A & P  Lower GI bleed -Patient  had 2 large bloody bowel movements yesterday -We will hold apixaban -Gastroenterology has been consulted -Had colonoscopy earlier this year which showed diverticulosis and internal hemorrhoids -No more further episodes of bleeding, likely self-limited hematochezia -GI has no further plans for any endoscopic procedures. - GI recommends CTA abdomen for brisk bleeding in the future.     Acute blood loss anemia -Hemoglobin is 8.3 today improved from 7.6 yesterday -Secondary to above -Follow CBC in a.m., and transfuse for hemoglobin less than 7   Acute on chronic hypoxemic and hypercapnic respiratory failure -Secondary to COPD exacerbation; history of tracheal stenosis -Completed steroid taper -Continue BiPAP nightly -Continue bronchodilators, Pulmicort, Brovana, DuoNeb -Today the patient is saturating 98% on 2 L.   Acute kidney injury -Likely from hypotension, creatinine went up to 1.32 -Resolved -Today creatinine is 0.93.   Atrial flutter with RVR -Heart rate improved, continue amiodarone, Coreg, digoxin -Eliquis will be held in setting of GI bleed.   Acute metabolic encephalopathy -Resolved -Continue Seroquel, Xanax as needed   Diabetes mellitus type 2 -Patient on moderate sliding scale insulin with NovoLog -Patient having episodes of hypoglycemia so dose of Lantus was changed to 25 units subcu twice daily -Sliding scale was changed to sensitive  -CBGs are better  Dysphagia: Patient is on DYS 2 diet with thin liquids.   Hypotension -Midodrine has been ordered  on an as needed basis for Systolic BP less than 828. - none has been needed for the last 3 days. Monitor.  I have seen and examined this patient myself. I have spent 35 minutes in his evaluation and care.   DVT Prophylaxis: SCD's CODE STATUS: Full Code Family Communication : Attempted to reach patient's DIL missy Shellenbarger. No answer. Disposition: Recommendation has been made for discharge to SNF. Rehab consulted.   Chevon Laufer, DO [facility] Direct contact: see [email or web address]  7PM-7AM contact night coverage as above 05/21/2021, 5:47 PM  LOS: 20 days

## 2021-05-22 ENCOUNTER — Inpatient Hospital Stay (HOSPITAL_COMMUNITY): Payer: Medicare HMO

## 2021-05-22 LAB — GLUCOSE, CAPILLARY
Glucose-Capillary: 205 mg/dL — ABNORMAL HIGH (ref 70–99)
Glucose-Capillary: 232 mg/dL — ABNORMAL HIGH (ref 70–99)
Glucose-Capillary: 128 mg/dL — ABNORMAL HIGH (ref 70–99)
Glucose-Capillary: 47 mg/dL — ABNORMAL LOW (ref 70–99)
Glucose-Capillary: 136 mg/dL — ABNORMAL HIGH (ref 70–99)
Glucose-Capillary: 125 mg/dL — ABNORMAL HIGH (ref 70–99)

## 2021-05-22 LAB — BASIC METABOLIC PANEL
Anion gap: 2 — ABNORMAL LOW (ref 5–15)
BUN: 23 mg/dL (ref 8–23)
CO2: 30 mmol/L (ref 22–32)
Calcium: 7.8 mg/dL — ABNORMAL LOW (ref 8.9–10.3)
Chloride: 102 mmol/L (ref 98–111)
Creatinine, Ser: 0.99 mg/dL (ref 0.61–1.24)
GFR, Estimated: >60 mL/min (ref >60)
Glucose, Bld: 224 mg/dL — ABNORMAL HIGH (ref 70–99)
Potassium: 4.5 mmol/L (ref 3.5–5.1)
Sodium: 134 mmol/L — ABNORMAL LOW (ref 135–145)

## 2021-05-22 LAB — CBC WITH DIFFERENTIAL/PLATELET
Abs Immature Granulocytes: 0.03 10*3/uL (ref 0.00–0.07)
Basophils Absolute: 0 10*3/uL (ref 0.0–0.1)
Basophils Relative: 0 %
Eosinophils Absolute: 0.2 10*3/uL (ref 0.0–0.5)
Eosinophils Relative: 4 %
HCT: 25.5 % — ABNORMAL LOW (ref 39.0–52.0)
Hemoglobin: 7.7 g/dL — ABNORMAL LOW (ref 13.0–17.0)
Immature Granulocytes: 0 %
Lymphocytes Relative: 10 %
Lymphs Abs: 0.7 10*3/uL (ref 0.7–4.0)
MCH: 29.2 pg (ref 26.0–34.0)
MCHC: 30.2 g/dL (ref 30.0–36.0)
MCV: 96.6 fL (ref 80.0–100.0)
Monocytes Absolute: 0.7 10*3/uL (ref 0.1–1.0)
Monocytes Relative: 10 %
Neutro Abs: 5.1 10*3/uL (ref 1.7–7.7)
Neutrophils Relative %: 76 %
Platelets: 339 10*3/uL (ref 150–400)
RBC: 2.64 MIL/uL — ABNORMAL LOW (ref 4.22–5.81)
RDW: 17.8 % — ABNORMAL HIGH (ref 11.5–15.5)
WBC: 6.7 10*3/uL (ref 4.0–10.5)
nRBC: 0 % (ref 0.0–0.2)

## 2021-05-22 LAB — DIGOXIN LEVEL: Digoxin Level: 1.9 ng/mL (ref 0.8–2.0)

## 2021-05-22 MED ORDER — INSULIN GLARGINE-YFGN 100 UNIT/ML ~~LOC~~ SOLN
15.0000 [IU] | Freq: Two times a day (BID) | SUBCUTANEOUS | Status: DC
  Administered 2021-05-22 – 2021-06-03 (×25): 15 [IU] via SUBCUTANEOUS
  Filled 2021-05-22 (×27): qty 0.15

## 2021-05-22 MED ORDER — DEXTROSE 50 % IV SOLN
25.0000 g | INTRAVENOUS | Status: AC
  Administered 2021-05-22: 16:00:00 25 g via INTRAVENOUS
  Filled 2021-05-22: qty 50

## 2021-05-22 NOTE — Progress Notes (Signed)
Hypoglycemic Event  CBG: 47 at 1605  Treatment: D50 50 mL (25 gm)   Symptoms:  Patient drowsy. Asked him if he can drink some juice, patient states shook his head no.   Follow-up CBG: Time:1625 CBG Result:136  Possible Reasons for Event: Inadequate meal intake. Tube feedings at night. Patient had small in take throughout the day.   Comments/MD notified: Dr. Benny Lennert at [address]

## 2021-05-22 NOTE — Progress Notes (Signed)
Placed patient  on bipap for the night IPAP=12 and EPAP=6 with oxygen set at 4lpm. Moderate to large leak due to feeding tube.

## 2021-05-22 NOTE — Progress Notes (Signed)
PROGRESS NOTE  Ronald [name] KTG:[id number] DOB: Dec 03, 1945 DOA: 05/01/2021 PCP: [name], FNP  Brief History   75 year old male with past medical history of COPD, Gold stage III-IV, tracheal stenosis s/p trach in 2018, CAD s/p DES x2 to LAD in 2015, hypertension, hyperlipidemia, chronic respiratory failure on 2 L, HFpEF, diabetes mellitus type 2 presented with shortness of breath.  He was started on steroids and DuoNebs by EMS, placed on CPAP without improvement.  Patient was intubated for airway protection.  PCCM was consulted. Patient had 2 episodes of bright red blood per rectum.  GI was consulted. They evaluated the patient, but felt that colonoscopy in the setting of the patient's active cardiopulmonary comorbidities presented risks due to the need for anesthesia that were best to be avoided. They felt that the patient was not briskly bleeding at this time, and that it made sense to monitor the patient with daily CBC's. They have recommended  CTA of the abdomen as a safer and quicker option to identify the site of bleeding should he start to bleed briskly again. Hemoglobin is stable at 8.3.  He was started on midodrine for hypotension, although it appears that he has not needed it for the last 3 days.   Consultants  Gastroenterology  Procedures  None  Antibiotics   Anti-infectives (From admission, onward)    Start     Dose/Rate Route Frequency Ordered Stop   05/06/21 1015  oseltamivir (TAMIFLU) capsule 75 mg        75 mg Per Tube 2 times daily 05/06/21 0917 05/06/21 0937   05/05/21 2100  azithromycin (ZITHROMAX) tablet 500 mg        500 mg Per Tube Every 24 hr x 2 05/04/21 2219 05/05/21 2134   05/04/21 2100  azithromycin (ZITHROMAX) tablet 500 mg  Status:  Discontinued        500 mg Oral Every 24 hr x 2 05/04/21 1110 05/04/21 2219   05/03/21 2200  oseltamivir (TAMIFLU) capsule 30 mg  Status:  Discontinued        30 mg Per Tube 2 times daily 05/03/21 1048 05/06/21 0917    05/03/21 1330  cefTRIAXone (ROCEPHIN) 2 g in sodium chloride 0.9 % 100 mL IVPB        2 g 200 mL/hr over 30 Minutes Intravenous Every 24 hours 05/03/21 1244 05/09/21 1504   05/03/21 1300  cefTRIAXone (ROCEPHIN) 1 g in sodium chloride 0.9 % 100 mL IVPB  Status:  Discontinued        1 g 200 mL/hr over 30 Minutes Intravenous Every 24 hours 05/03/21 1210 05/03/21 1244   05/01/21 2300  cefTRIAXone (ROCEPHIN) 1 g in sodium chloride 0.9 % 100 mL IVPB  Status:  Discontinued        1 g 200 mL/hr over 30 Minutes Intravenous Every 24 hours 05/01/21 1957 05/02/21 1102   05/01/21 2300  oseltamivir (TAMIFLU) capsule 75 mg  Status:  Discontinued        75 mg Per Tube 2 times daily 05/01/21 2221 05/03/21 1048   05/01/21 2200  ceFEPIme (MAXIPIME) 2 g in sodium chloride 0.9 % 100 mL IVPB  Status:  Discontinued        2 g 200 mL/hr over 30 Minutes Intravenous Every 8 hours 05/01/21 1346 05/01/21 1957   05/01/21 2200  oseltamivir (TAMIFLU) 6 MG/ML suspension 75 mg  Status:  Discontinued        75 mg Per Tube 2 times daily 05/01/21 1957 05/01/21  2221   05/01/21 2100  azithromycin (ZITHROMAX) 500 mg in sodium chloride 0.9 % 250 mL IVPB  Status:  Discontinued        500 mg 250 mL/hr over 60 Minutes Intravenous Every 24 hours 05/01/21 1957 05/04/21 1109   05/01/21 1400  doxycycline (VIBRAMYCIN) 100 mg in sodium chloride 0.9 % 250 mL IVPB  Status:  Discontinued        100 mg 125 mL/hr over 120 Minutes Intravenous Every 12 hours 05/01/21 1336 05/01/21 2018   05/01/21 1345  ceFEPIme (MAXIPIME) 2 g in sodium chloride 0.9 % 100 mL IVPB        2 g 200 mL/hr over 30 Minutes Intravenous  Once 05/01/21 1336 05/01/21 1804      Subjective  The patient is resting comfortably. No new complaints.   Objective   Vitals:  Vitals:   05/22/21 1222 05/22/21 1754  BP: (!) 112/46 (!) 159/68  Pulse: 90 95  Resp:  (!) 22  Temp: 98.7 F (37.1 C) 97.9 F (36.6 C)  SpO2: 100% 98%   Exam:  Constitutional:  The patient is  awake, alert, and oriented x 3. No acute distress. Respiratory:  No increased work of breathing. No wheezes, rales, or rhonchi No tactile fremitus Cardiovascular:  Regular rate and rhythm No murmurs, ectopy, or gallups. No lateral PMI. No thrills. Abdomen:  Abdomen is soft, non-tender, non-distended No hernias, masses, or organomegaly Normoactive bowel sounds.  Musculoskeletal:  No cyanosis, clubbing, or edema Skin:  No rashes, lesions, ulcers palpation of skin: no induration or nodules Neurologic:  CN 2-12 intact Sensation all 4 extremities intact Psychiatric:  Mental status Mood, affect appropriate Orientation to person, place, time  judgment and insight appear intact  I have personally reviewed the following:   Today's Data  Vitals  Lab Data  CMP CBC  Micro Data    Imaging  CXR  Cardiology Data  EKG Echocardiogram  Scheduled Meds:  amiodarone  100 mg Per Tube Daily   arformoterol  15 mcg Nebulization BID   aspirin  81 mg Per Tube Daily   atorvastatin  40 mg Per Tube QPM   budesonide (PULMICORT) nebulizer solution  0.5 mg Nebulization BID   carvedilol  25 mg Per Tube BID WC   Chlorhexidine Gluconate Cloth  6 each Topical Daily   docusate  100 mg Per Tube BID   escitalopram  20 mg Per Tube Daily   feeding supplement  237 mL Oral BID BM   feeding supplement (OSMOLITE 1.5 CAL)  1,000 mL Per Tube Q24H   feeding supplement (PROSource TF)  45 mL Per Tube QID   guaiFENesin  5 mL Per Tube Q6H   insulin aspart  0-9 Units Subcutaneous TID WC   insulin glargine-yfgn  15 Units Subcutaneous BID   ipratropium  0.5 mg Nebulization TID   isosorbide dinitrate  10 mg Per Tube BID   levalbuterol  1.25 mg Nebulization TID   mouth rinse  15 mL Mouth Rinse BID   melatonin  3 mg Oral QHS   mirtazapine  15 mg Per Tube QHS   nicotine  14 mg Transdermal Daily   pantoprazole (PROTONIX) IV  40 mg Intravenous Q12H   polyethylene glycol  17 g Per Tube Daily   QUEtiapine   100 mg Per Tube BID   Continuous Infusions:  sodium chloride Stopped (05/19/21 1600)    Principal Problem:   Acute hypercapnic respiratory failure (HCC) Active Problems:   Coronary artery disease  COPD exacerbation (HCC)   Tracheal stenosis   Elevated troponin   Atrial flutter (HCC)   Shock circulatory (HCC)   Influenza A   Malnutrition of moderate degree   LOS: 21 days   A & P  Lower GI bleed -Patient  had 2 large bloody bowel movements 05/20/2021 -Apixaban has been held -Gastroenterology has been consulted. They felt that endoscopy was not in the patient's best interest as bleeding had slowed, and anesthesia would cause risk to the patient given his cardiopulmonary coorbidities. They have recommended CTA abdomen should the patient's bleeding become brisk again. GI has no further plans for any endoscopic procedures -Had colonoscopy earlier this year which showed diverticulosis and internal hemorrhoids -No more further episodes of bleeding, likely self-limited hematochezia -Hemoglobin is stable at 7.7 today.     Acute blood loss anemia -Hemoglobin is 7.7 today improved from 7.6 05/20/2021. -Secondary to above -Follow CBC in a.m., and transfuse for hemoglobin less than 7   Acute on chronic hypoxemic and hypercapnic respiratory failure -Secondary to COPD exacerbation; history of tracheal stenosis -Completed steroid taper -Continue BiPAP nightly -Continue bronchodilators, Pulmicort, Brovana, DuoNeb -Today the patient is saturating 98% on 4 L up from 2L yesterday. Will check CXR.   Acute kidney injury -Likely from hypotension, creatinine went up to 1.32 -Resolved -Today creatinine is 0.99.   Atrial flutter with RVR -Heart rate improved, continue amiodarone, Coreg, digoxin -Pt is still in atrial flutter. -Eliquis will be held in setting of GI bleed.   Acute metabolic encephalopathy -Resolved -Continue Seroquel, Xanax as needed   Diabetes mellitus type 2 -Patient  on moderate sliding scale insulin with NovoLog -Patient having episodes of hypoglycemia so dose of Lantus was changed to 25 units subcu twice daily -Sliding scale was changed to sensitive  -CBGs are better  Dysphagia: Patient is on DYS 2 diet with thin liquids.   Hypotension -Resolved -Midodrine has been ordered on an as needed basis for Systolic BP less than 149. - none has been needed for the last 3 days. Monitor.  I have seen and examined this patient myself. I have spent 32 minutes in his evaluation and care.   DVT Prophylaxis: SCD's CODE STATUS: Full Code Family Communication : Attempted to reach patient's DIL missy Komperda. No answer. Disposition: Recommendation has been made for discharge to SNF. Rehab consulted.   Nuvia Hileman, DO [facility] Direct contact: see [email or web address]  7PM-7AM contact night coverage as above 05/22/2021, 6:44 PM  LOS: 20 days

## 2021-05-22 NOTE — Progress Notes (Signed)
Dr. Benny Lennert paged; patient had atrial pause for 2.73 seconds, HR went to 33bpm. Stayed for less then a sec and went back up to 74bpm.   RN went to room. Patient sleeping, woken up for vitals. Patient states he is fine, asymp. Vitals charted.   Per Provider, holding digoxin medicine and digoxin level lab draw placed.

## 2021-05-22 NOTE — Progress Notes (Signed)
1630:Patient Daughter in law and son called for an update and to check on patient. RN unable to take call at the moment but will call family back shortly.   1830:RN called daughter in law and gave an update about his day. Family voiced concern about poor communication between providers and the family. The family would like to be updated daily by provider and to hear from social worker about SNF plan.  RN informed family that unit staff will have provider and SW reach out and if they do not hear from the providers by mid day to not hesitate to reach out earlier rather then later in the evening.

## 2021-05-23 LAB — CBC WITH DIFFERENTIAL/PLATELET
Abs Immature Granulocytes: 0.02 10*3/uL (ref 0.00–0.07)
Basophils Absolute: 0 10*3/uL (ref 0.0–0.1)
Basophils Relative: 0 %
Eosinophils Absolute: 0.3 10*3/uL (ref 0.0–0.5)
Eosinophils Relative: 4 %
HCT: 26.2 % — ABNORMAL LOW (ref 39.0–52.0)
Hemoglobin: 8.1 g/dL — ABNORMAL LOW (ref 13.0–17.0)
Immature Granulocytes: 0 %
Lymphocytes Relative: 10 %
Lymphs Abs: 0.7 10*3/uL (ref 0.7–4.0)
MCH: 29.9 pg (ref 26.0–34.0)
MCHC: 30.9 g/dL (ref 30.0–36.0)
MCV: 96.7 fL (ref 80.0–100.0)
Monocytes Absolute: 0.6 10*3/uL (ref 0.1–1.0)
Monocytes Relative: 10 %
Neutro Abs: 5.1 10*3/uL (ref 1.7–7.7)
Neutrophils Relative %: 76 %
Platelets: 344 10*3/uL (ref 150–400)
RBC: 2.71 MIL/uL — ABNORMAL LOW (ref 4.22–5.81)
RDW: 18.4 % — ABNORMAL HIGH (ref 11.5–15.5)
WBC: 6.7 10*3/uL (ref 4.0–10.5)
nRBC: 0 % (ref 0.0–0.2)

## 2021-05-23 LAB — COMPREHENSIVE METABOLIC PANEL
ALT: 16 U/L (ref 0–44)
AST: 12 U/L — ABNORMAL LOW (ref 15–41)
Albumin: 2 g/dL — ABNORMAL LOW (ref 3.5–5.0)
Alkaline Phosphatase: 68 U/L (ref 38–126)
Anion gap: 5 (ref 5–15)
BUN: 23 mg/dL (ref 8–23)
CO2: 29 mmol/L (ref 22–32)
Calcium: 7.9 mg/dL — ABNORMAL LOW (ref 8.9–10.3)
Chloride: 105 mmol/L (ref 98–111)
Creatinine, Ser: 0.83 mg/dL (ref 0.61–1.24)
GFR, Estimated: >60 mL/min (ref >60)
Glucose, Bld: 162 mg/dL — ABNORMAL HIGH (ref 70–99)
Potassium: 4.7 mmol/L (ref 3.5–5.1)
Sodium: 139 mmol/L (ref 135–145)
Total Bilirubin: 1.4 mg/dL — ABNORMAL HIGH (ref 0.3–1.2)
Total Protein: 5.3 g/dL — ABNORMAL LOW (ref 6.5–8.1)

## 2021-05-23 LAB — GLUCOSE, CAPILLARY
Glucose-Capillary: 212 mg/dL — ABNORMAL HIGH (ref 70–99)
Glucose-Capillary: 124 mg/dL — ABNORMAL HIGH (ref 70–99)
Glucose-Capillary: 92 mg/dL (ref 70–99)
Glucose-Capillary: 176 mg/dL — ABNORMAL HIGH (ref 70–99)

## 2021-05-23 LAB — DIGOXIN LEVEL: Digoxin Level: 1.5 ng/mL (ref 0.8–2.0)

## 2021-05-23 LAB — MAGNESIUM: Magnesium: 2.1 mg/dL (ref 1.7–2.4)

## 2021-05-23 MED ORDER — QUETIAPINE FUMARATE 100 MG PO TABS
100.0000 mg | ORAL_TABLET | Freq: Every day | ORAL | Status: DC
Start: 2021-05-24 — End: 2021-06-06
  Administered 2021-05-24 – 2021-06-05 (×13): 100 mg
  Filled 2021-05-23 (×13): qty 1

## 2021-05-23 MED ORDER — QUETIAPINE FUMARATE 25 MG PO TABS
25.0000 mg | ORAL_TABLET | Freq: Every day | ORAL | Status: DC
  Administered 2021-05-24 – 2021-06-06 (×14): 25 mg
  Filled 2021-05-23 (×14): qty 1

## 2021-05-23 MED ORDER — ALPRAZOLAM 0.5 MG PO TABS
0.5000 mg | ORAL_TABLET | Freq: Two times a day (BID) | ORAL | Status: DC | PRN
  Administered 2021-05-25 – 2021-06-05 (×12): 0.5 mg
  Filled 2021-05-23 (×12): qty 1

## 2021-05-23 MED ORDER — QUETIAPINE FUMARATE 25 MG PO TABS
25.0000 mg | ORAL_TABLET | Freq: Two times a day (BID) | ORAL | Status: DC | PRN
  Administered 2021-05-28: 10:00:00 25 mg
  Filled 2021-05-23: qty 1

## 2021-05-23 MED ORDER — MELATONIN 3 MG PO TABS
3.0000 mg | ORAL_TABLET | Freq: Every day | ORAL | Status: DC
  Administered 2021-05-23 – 2021-06-05 (×14): 3 mg
  Filled 2021-05-23 (×14): qty 1

## 2021-05-23 NOTE — Progress Notes (Signed)
PROGRESS NOTE  Ronald [name] SJG:[id number] DOB: 06/28/1945 DOA: 05/01/2021 PCP: [name], FNP  Brief History   75 year old male with past medical history of COPD, Gold stage III-IV, tracheal stenosis s/p trach in 2018, CAD s/p DES x2 to LAD in 2015, hypertension, hyperlipidemia, chronic respiratory failure on 2 L, HFpEF, diabetes mellitus type 2 presented with shortness of breath.  He was started on steroids and DuoNebs by EMS, placed on CPAP without improvement.  Patient was intubated for airway protection.  PCCM was consulted. Patient had 2 episodes of bright red blood per rectum.  GI was consulted. They evaluated the patient, but felt that colonoscopy in the setting of the patient's active cardiopulmonary comorbidities presented risks due to the need for anesthesia that were best to be avoided. They felt that the patient was not briskly bleeding at this time, and that it made sense to monitor the patient with daily CBC's. They have recommended  CTA of the abdomen as a safer and quicker option to identify the site of bleeding should he start to bleed briskly again. Hemoglobin is stable at 8.3.  He was started on midodrine for hypotension, although it appears that he has not needed it for the last 3 days. I will stop it.  Today again the patient is somnolent and difficult to rouse. I believe that this is in part the cause for his poor PO intake.   Consultants  Gastroenterology  Procedures  None  Antibiotics   Anti-infectives (From admission, onward)    Start     Dose/Rate Route Frequency Ordered Stop   05/06/21 1015  oseltamivir (TAMIFLU) capsule 75 mg        75 mg Per Tube 2 times daily 05/06/21 0917 05/06/21 0937   05/05/21 2100  azithromycin (ZITHROMAX) tablet 500 mg        500 mg Per Tube Every 24 hr x 2 05/04/21 2219 05/05/21 2134   05/04/21 2100  azithromycin (ZITHROMAX) tablet 500 mg  Status:  Discontinued        500 mg Oral Every 24 hr x 2 05/04/21 1110 05/04/21 2219    05/03/21 2200  oseltamivir (TAMIFLU) capsule 30 mg  Status:  Discontinued        30 mg Per Tube 2 times daily 05/03/21 1048 05/06/21 0917   05/03/21 1330  cefTRIAXone (ROCEPHIN) 2 g in sodium chloride 0.9 % 100 mL IVPB        2 g 200 mL/hr over 30 Minutes Intravenous Every 24 hours 05/03/21 1244 05/09/21 1504   05/03/21 1300  cefTRIAXone (ROCEPHIN) 1 g in sodium chloride 0.9 % 100 mL IVPB  Status:  Discontinued        1 g 200 mL/hr over 30 Minutes Intravenous Every 24 hours 05/03/21 1210 05/03/21 1244   05/01/21 2300  cefTRIAXone (ROCEPHIN) 1 g in sodium chloride 0.9 % 100 mL IVPB  Status:  Discontinued        1 g 200 mL/hr over 30 Minutes Intravenous Every 24 hours 05/01/21 1957 05/02/21 1102   05/01/21 2300  oseltamivir (TAMIFLU) capsule 75 mg  Status:  Discontinued        75 mg Per Tube 2 times daily 05/01/21 2221 05/03/21 1048   05/01/21 2200  ceFEPIme (MAXIPIME) 2 g in sodium chloride 0.9 % 100 mL IVPB  Status:  Discontinued        2 g 200 mL/hr over 30 Minutes Intravenous Every 8 hours 05/01/21 1346 05/01/21 1957   05/01/21 2200  oseltamivir (TAMIFLU) 6 MG/ML suspension 75 mg  Status:  Discontinued        75 mg Per Tube 2 times daily 05/01/21 1957 05/01/21 2221   05/01/21 2100  azithromycin (ZITHROMAX) 500 mg in sodium chloride 0.9 % 250 mL IVPB  Status:  Discontinued        500 mg 250 mL/hr over 60 Minutes Intravenous Every 24 hours 05/01/21 1957 05/04/21 1109   05/01/21 1400  doxycycline (VIBRAMYCIN) 100 mg in sodium chloride 0.9 % 250 mL IVPB  Status:  Discontinued        100 mg 125 mL/hr over 120 Minutes Intravenous Every 12 hours 05/01/21 1336 05/01/21 2018   05/01/21 1345  ceFEPIme (MAXIPIME) 2 g in sodium chloride 0.9 % 100 mL IVPB        2 g 200 mL/hr over 30 Minutes Intravenous  Once 05/01/21 1336 05/01/21 1804      Subjective  The patient is sleeping. Difficult to rouse. No new complaints.   Objective   Vitals:  Vitals:   05/23/21 1446 05/23/21 1706  BP:  (!)  135/56  Pulse:    Resp:    Temp:    SpO2: 100%    Exam:  Constitutional:  The patient is sleeping. He is difficult to rouse. No acute distress. Respiratory:  No increased work of breathing. No wheezes, rales, or rhonchi No tactile fremitus Cardiovascular:  Regular rate and rhythm No murmurs, ectopy, or gallups. No lateral PMI. No thrills. Abdomen:  Abdomen is soft, non-tender, non-distended No hernias, masses, or organomegaly Normoactive bowel sounds.  Musculoskeletal:  No cyanosis, clubbing, or edema Skin:  No rashes, lesions, ulcers palpation of skin: no induration or nodules Neurologic:  CN 2-12 intact Sensation all 4 extremities intact Psychiatric:  Mental status Mood, affect appropriate Orientation to person, place, time  judgment and insight appear intact  I have personally reviewed the following:   Today's Data  Vitals  Lab Data  CMP CBC  Micro Data    Imaging  CXR  Cardiology Data  EKG Echocardiogram  Scheduled Meds:  amiodarone  100 mg Per Tube Daily   arformoterol  15 mcg Nebulization BID   aspirin  81 mg Per Tube Daily   atorvastatin  40 mg Per Tube QPM   budesonide (PULMICORT) nebulizer solution  0.5 mg Nebulization BID   carvedilol  25 mg Per Tube BID WC   Chlorhexidine Gluconate Cloth  6 each Topical Daily   docusate  100 mg Per Tube BID   escitalopram  20 mg Per Tube Daily   feeding supplement  237 mL Oral BID BM   feeding supplement (OSMOLITE 1.5 CAL)  1,000 mL Per Tube Q24H   feeding supplement (PROSource TF)  45 mL Per Tube QID   guaiFENesin  5 mL Per Tube Q6H   insulin aspart  0-9 Units Subcutaneous TID WC   insulin glargine-yfgn  15 Units Subcutaneous BID   ipratropium  0.5 mg Nebulization TID   isosorbide dinitrate  10 mg Per Tube BID   levalbuterol  1.25 mg Nebulization TID   mouth rinse  15 mL Mouth Rinse BID   melatonin  3 mg Per Tube QHS   mirtazapine  15 mg Per Tube QHS   nicotine  14 mg Transdermal Daily    pantoprazole (PROTONIX) IV  40 mg Intravenous Q12H   polyethylene glycol  17 g Per Tube Daily   [START ON 05/24/2021] QUEtiapine  100 mg Per Tube QHS   [START  ON 05/24/2021] QUEtiapine  25 mg Per Tube Daily   Continuous Infusions:  sodium chloride Stopped (05/19/21 1600)    Principal Problem:   Acute hypercapnic respiratory failure (HCC) Active Problems:   Coronary artery disease   COPD exacerbation (HCC)   Tracheal stenosis   Elevated troponin   Atrial flutter (HCC)   Shock circulatory (HCC)   Influenza A   Malnutrition of moderate degree   LOS: 22 days   A & P  Lower GI bleed -Patient  had 2 large bloody bowel movements 05/20/2021 -Apixaban has been held -Gastroenterology has been consulted. They felt that endoscopy was not in the patient's best interest as bleeding had slowed, and anesthesia would cause risk to the patient given his cardiopulmonary coorbidities. They have recommended CTA abdomen should the patient's bleeding become brisk again. GI has no further plans for any endoscopic procedures -Had colonoscopy earlier this year which showed diverticulosis and internal hemorrhoids -No more further episodes of bleeding, likely self-limited hematochezia -Hemoglobin is stable at 7.7 today.  Decreased level of consciousness: I have reduced doses of sedating medications and reduced the dose of seroquel in the attempt to improve his level of alertness. Monitor.   Acute blood loss anemia -Hemoglobin is 7.7 today improved from 7.6 05/20/2021. -Secondary to above -Follow CBC in a.m., and transfuse for hemoglobin less than 7   Acute on chronic hypoxemic and hypercapnic respiratory failure -Secondary to COPD exacerbation; history of tracheal stenosis -Completed steroid taper -Continue BiPAP nightly -Continue bronchodilators, Pulmicort, Brovana, DuoNeb -Today the patient is saturating 98% on 4 L up from 2L yesterday. Will check CXR.   Acute kidney injury -Likely from  hypotension, creatinine went up to 1.32 -Resolved -Today creatinine is 0.99.   Atrial flutter with RVR -Heart rate improved, continue amiodarone, Coreg, digoxin -Pt is still in atrial flutter. -Eliquis will be held in setting of GI bleed.   Acute metabolic encephalopathy -Resolved -Continue Seroquel, Xanax as needed   Diabetes mellitus type 2 -Patient on moderate sliding scale insulin with NovoLog -Patient having episodes of hypoglycemia so dose of Lantus was changed to 25 units subcu twice daily -Sliding scale was changed to sensitive  -CBGs are better  Dysphagia: Patient is on DYS 2 diet with thin liquids.  Poor PO intake: Supplemented by tube feeds due to continued poor PO intake. Attempting to improve his level of alertness to allow for improved PO intake. Otherwise family will have to decide about PEG tube for long term tube feeds.   Hypotension -Resolved -Midodrine has been ordered on an as needed basis for Systolic BP less than 161. - none has been needed for the last 3 days. Midodrine has been discontinued.  I have seen and examined this patient myself. I have spent 36 minutes in his evaluation and care.   DVT Prophylaxis: SCD's CODE STATUS: Full Code Family Communication : Attempted to reach patient's DIL missy Dillion. No answer. Disposition: Recommendation has been made for discharge to SNF. Rehab consulted.   Sol Odor, DO [facility] Direct contact: see [email or web address]  7PM-7AM contact night coverage as above 05/23/2021, 6:59 PM  LOS: 20 days

## 2021-05-23 NOTE — Progress Notes (Signed)
Nutrition Follow-up  DOCUMENTATION CODES:   Non-severe (moderate) malnutrition in context of acute illness/injury  INTERVENTION:   Recommend considering placement of PEG for long-term nutrition support if PO intake does not improve  Continue nocturnal TF via Cortrak: Osmolite 1.5 @ 50 ml/h x 12 hours at night (6pm-6am), 600 ml total per night Prosource TF 45 ml QID   Provides 1060 kcal (48 % of estimated needs), 82 gm protein (68% of estimated needs), 459 ml free water daily.   Recommend discontinue TF when patient is eating >/= 75% of meals consistently.   Continue MVI with minerals daily via tube.   Continue Ensure Enlive po BID, each supplement provides 350 kcal and 20 grams of protein.  NUTRITION DIAGNOSIS:   Moderate Malnutrition related to acute illness (influenza A) as evidenced by mild muscle depletion, mild fat depletion.  ongoing  GOAL:   Patient will meet greater than or equal to 90% of their needs  Addressing with supplements and TF  MONITOR:   TF tolerance, Labs, Diet advancement  REASON FOR ASSESSMENT:   Consult Enteral/tube feeding initiation and management  ASSESSMENT:   75 yo male admitted from [facility] with acute respiratory failure, influenza A. PMH includes COPD, CAD, ulcerative colitis, HTN, asthma, DM-2, TIA, GERD, HLD.  Pt remains on dysphagia 2 diet with thin liquids, but has continued to have inadequate PO intake with 0-25% meal intake since last RD visit. RN reports varied acceptance of Ensure supplements. Given poor meal completion, pt has been receiving nocturnal TF via Cortrak (tip at the LOT) to meet his nutritional needs. Currently receiving: Osmolite 1.5 @ 50 x 12 hours with Prosource TF QID. Recommend continue to provide nocturnal TF until pt can consistently meet at least 75% of his needs PO. If pt unable to do this, recommend discussion regarding PEG for long-term nutrition support.   UOP: 877ml x24 hours I/O: +3547ml since  admit  Current weight: 88.6 kg Admit weight: 85.4 kg   generalized mild pitting edema per RN assessment  Medications: Scheduled Meds:  amiodarone  100 mg Per Tube Daily   arformoterol  15 mcg Nebulization BID   aspirin  81 mg Per Tube Daily   atorvastatin  40 mg Per Tube QPM   budesonide (PULMICORT) nebulizer solution  0.5 mg Nebulization BID   carvedilol  25 mg Per Tube BID WC   Chlorhexidine Gluconate Cloth  6 each Topical Daily   docusate  100 mg Per Tube BID   escitalopram  20 mg Per Tube Daily   feeding supplement  237 mL Oral BID BM   feeding supplement (OSMOLITE 1.5 CAL)  1,000 mL Per Tube Q24H   feeding supplement (PROSource TF)  45 mL Per Tube QID   guaiFENesin  5 mL Per Tube Q6H   insulin aspart  0-9 Units Subcutaneous TID WC   insulin glargine-yfgn  15 Units Subcutaneous BID   ipratropium  0.5 mg Nebulization TID   isosorbide dinitrate  10 mg Per Tube BID   levalbuterol  1.25 mg Nebulization TID   mouth rinse  15 mL Mouth Rinse BID   melatonin  3 mg Per Tube QHS   mirtazapine  15 mg Per Tube QHS   nicotine  14 mg Transdermal Daily   pantoprazole (PROTONIX) IV  40 mg Intravenous Q12H   polyethylene glycol  17 g Per Tube Daily   [START ON 05/24/2021] QUEtiapine  100 mg Per Tube QHS   [START ON 05/24/2021] QUEtiapine  25 mg Per  Tube Daily  Continuous Infusions:  sodium chloride Stopped (05/19/21 1600)    Labs: Recent Labs  Lab 05/21/21 0322 05/22/21 0227 05/23/21 0052  NA 135 134* 139  K 4.6 4.5 4.7  CL 102 102 105  CO2 27 30 29   BUN 21 23 23   CREATININE 0.93 0.99 0.83  CALCIUM 7.8* 7.8* 7.9*  MG  --   --  2.1  GLUCOSE 189* 224* 162*  CBGs: 124-212 x24 hours   Diet Order:   Diet Order             DIET DYS 2 Room service appropriate? Yes; Fluid consistency: Thin  Diet effective now                   EDUCATION NEEDS:   No education needs have been identified at this time  Skin:  Skin Assessment: Reviewed RN Assessment  Last BM:   12/14  Height:   Ht Readings from Last 1 Encounters:  04/27/21 5\' 9"  (1.753 m)    Weight:   Wt Readings from Last 1 Encounters:  05/20/21 88.6 kg     BMI:  Body mass index is 28.84 kg/m.  Estimated Nutritional Needs:   Kcal:  2200-2400  Protein:  120-130 gm  Fluid:  2.1-2.3 L     Bridie Colquhoun A., MS, RD, LDN (she/her/hers) RD pager number and weekend/on-call pager number located in [place].

## 2021-05-23 NOTE — Progress Notes (Signed)
Update provided to patient's DIL, [name].

## 2021-05-23 NOTE — TOC Progression Note (Signed)
Transition of Care [facility]) - Progression Note    Patient Details  Name: Ronald [name] MRN: [id number] Date of Birth: 1946/04/05  Transition of Care [facility]) CM/SW Contact  Joanne Chars, LCSW Phone Number: 05/23/2021, 8:57 AM  Clinical Narrative:   CSW attempted to speak with pt.  Pt asleep, did not respond to voice.    Still no SNF bed offers at this time.    Expected Discharge Plan: [place] (vs SNF) Barriers to Discharge: Continued Medical Work up  Expected Discharge Plan and Services Expected Discharge Plan: [place] (vs SNF)   Discharge Planning Services: CM Consult   Living arrangements for the past 2 months: Single Family Home                                       Social Determinants of Health (SDOH) Interventions    Readmission Risk Interventions Readmission Risk Prevention Plan 09/28/2020  Transportation Screening Complete  HRI or Home Care Consult Complete  Social Work Consult for [place] Planning/Counseling Complete  Palliative Care Screening Not Applicable  Medication Review Press photographer) Complete  Some recent data might be hidden

## 2021-05-24 LAB — BASIC METABOLIC PANEL
Anion gap: 4 — ABNORMAL LOW (ref 5–15)
BUN: 27 mg/dL — ABNORMAL HIGH (ref 8–23)
CO2: 28 mmol/L (ref 22–32)
Calcium: 8 mg/dL — ABNORMAL LOW (ref 8.9–10.3)
Chloride: 105 mmol/L (ref 98–111)
Creatinine, Ser: 0.92 mg/dL (ref 0.61–1.24)
GFR, Estimated: >60 mL/min (ref >60)
Glucose, Bld: 218 mg/dL — ABNORMAL HIGH (ref 70–99)
Potassium: 5 mmol/L (ref 3.5–5.1)
Sodium: 137 mmol/L (ref 135–145)

## 2021-05-24 LAB — GLUCOSE, CAPILLARY
Glucose-Capillary: 213 mg/dL — ABNORMAL HIGH (ref 70–99)
Glucose-Capillary: 173 mg/dL — ABNORMAL HIGH (ref 70–99)
Glucose-Capillary: 184 mg/dL — ABNORMAL HIGH (ref 70–99)
Glucose-Capillary: 228 mg/dL — ABNORMAL HIGH (ref 70–99)

## 2021-05-24 LAB — CBC
HCT: 26.5 % — ABNORMAL LOW (ref 39.0–52.0)
Hemoglobin: 8.1 g/dL — ABNORMAL LOW (ref 13.0–17.0)
MCH: 29.7 pg (ref 26.0–34.0)
MCHC: 30.6 g/dL (ref 30.0–36.0)
MCV: 97.1 fL (ref 80.0–100.0)
Platelets: 368 10*3/uL (ref 150–400)
RBC: 2.73 MIL/uL — ABNORMAL LOW (ref 4.22–5.81)
RDW: 18.2 % — ABNORMAL HIGH (ref 11.5–15.5)
WBC: 7.5 10*3/uL (ref 4.0–10.5)
nRBC: 0 % (ref 0.0–0.2)

## 2021-05-24 LAB — DIGOXIN LEVEL: Digoxin Level: 0.9 ng/mL (ref 0.8–2.0)

## 2021-05-24 MED ORDER — PANTOPRAZOLE 2 MG/ML SUSPENSION
40.0000 mg | Freq: Every day | ORAL | Status: DC
  Administered 2021-05-25 – 2021-06-05 (×12): 40 mg
  Filled 2021-05-24 (×16): qty 20

## 2021-05-24 NOTE — TOC Progression Note (Signed)
Transition of Care [facility]) - Progression Note    Patient Details  Name: Ronald [name] MRN: [id number] Date of Birth: 09-14-1945  Transition of Care [facility]) CM/SW [place], [place] Phone Number: 05/24/2021, 1:52 PM  Clinical Narrative:     CSW was notified by [facility], that they are able to offer a bed. CSW notified son who stated that was the preferred facility and they would like to accept the bed offer. Pt still with a cortrak, will hold off on insurance authorization until medical team decides on next steps. TOC will continue to follow for DC needs.  Expected Discharge Plan: [place] (vs SNF) Barriers to Discharge: Continued Medical Work up  Expected Discharge Plan and Services Expected Discharge Plan: [place] (vs SNF)   Discharge Planning Services: CM Consult   Living arrangements for the past 2 months: Single Family Home                                       Social Determinants of Health (SDOH) Interventions    Readmission Risk Interventions Readmission Risk Prevention Plan 09/28/2020  Transportation Screening Complete  HRI or Home Care Consult Complete  Social Work Consult for [place] Planning/Counseling Complete  Palliative Care Screening Not Applicable  Medication Review Press photographer) Complete  Some recent data might be hidden

## 2021-05-24 NOTE — Progress Notes (Signed)
PROGRESS NOTE  Ronald [name] VZC:[id number] DOB: September 11, 1945 DOA: 05/01/2021 PCP: [name], FNP  Brief History   75 year old male with past medical history of COPD, Gold stage III-IV, tracheal stenosis s/p trach in 2018, CAD s/p DES x2 to LAD in 2015, hypertension, hyperlipidemia, chronic respiratory failure on 2 L, HFpEF, diabetes mellitus type 2 presented with shortness of breath.  He was started on steroids and DuoNebs by EMS, placed on CPAP without improvement.  Patient was intubated for airway protection.  PCCM was consulted. Patient had 2 episodes of bright red blood per rectum.  GI was consulted. They evaluated the patient, but felt that colonoscopy in the setting of the patient's active cardiopulmonary comorbidities presented risks due to the need for anesthesia that were best to be avoided. They felt that the patient was not briskly bleeding at this time, and that it made sense to monitor the patient with daily CBC's. They have recommended  CTA of the abdomen as a safer and quicker option to identify the site of bleeding should he start to bleed briskly again. Hemoglobin is stable at 8.3.  He was started on midodrine for hypotension, although it appears that he has not needed it for the last 3 days. I will stop it.  The patient's xanax and seroquel have beenreduced due to the patient's extreme somnolence for the last 2 days. He is awake and alert today. He perseverates a bit on returning to [place], but he is calm  Consultants  Gastroenterology  Procedures  None  Antibiotics   Anti-infectives (From admission, onward)    Start     Dose/Rate Route Frequency Ordered Stop   05/06/21 1015  oseltamivir (TAMIFLU) capsule 75 mg        75 mg Per Tube 2 times daily 05/06/21 0917 05/06/21 0937   05/05/21 2100  azithromycin (ZITHROMAX) tablet 500 mg        500 mg Per Tube Every 24 hr x 2 05/04/21 2219 05/05/21 2134   05/04/21 2100  azithromycin (ZITHROMAX) tablet 500 mg  Status:   Discontinued        500 mg Oral Every 24 hr x 2 05/04/21 1110 05/04/21 2219   05/03/21 2200  oseltamivir (TAMIFLU) capsule 30 mg  Status:  Discontinued        30 mg Per Tube 2 times daily 05/03/21 1048 05/06/21 0917   05/03/21 1330  cefTRIAXone (ROCEPHIN) 2 g in sodium chloride 0.9 % 100 mL IVPB        2 g 200 mL/hr over 30 Minutes Intravenous Every 24 hours 05/03/21 1244 05/09/21 1504   05/03/21 1300  cefTRIAXone (ROCEPHIN) 1 g in sodium chloride 0.9 % 100 mL IVPB  Status:  Discontinued        1 g 200 mL/hr over 30 Minutes Intravenous Every 24 hours 05/03/21 1210 05/03/21 1244   05/01/21 2300  cefTRIAXone (ROCEPHIN) 1 g in sodium chloride 0.9 % 100 mL IVPB  Status:  Discontinued        1 g 200 mL/hr over 30 Minutes Intravenous Every 24 hours 05/01/21 1957 05/02/21 1102   05/01/21 2300  oseltamivir (TAMIFLU) capsule 75 mg  Status:  Discontinued        75 mg Per Tube 2 times daily 05/01/21 2221 05/03/21 1048   05/01/21 2200  ceFEPIme (MAXIPIME) 2 g in sodium chloride 0.9 % 100 mL IVPB  Status:  Discontinued        2 g 200 mL/hr over 30 Minutes Intravenous  Every 8 hours 05/01/21 1346 05/01/21 1957   05/01/21 2200  oseltamivir (TAMIFLU) 6 MG/ML suspension 75 mg  Status:  Discontinued        75 mg Per Tube 2 times daily 05/01/21 1957 05/01/21 2221   05/01/21 2100  azithromycin (ZITHROMAX) 500 mg in sodium chloride 0.9 % 250 mL IVPB  Status:  Discontinued        500 mg 250 mL/hr over 60 Minutes Intravenous Every 24 hours 05/01/21 1957 05/04/21 1109   05/01/21 1400  doxycycline (VIBRAMYCIN) 100 mg in sodium chloride 0.9 % 250 mL IVPB  Status:  Discontinued        100 mg 125 mL/hr over 120 Minutes Intravenous Every 12 hours 05/01/21 1336 05/01/21 2018   05/01/21 1345  ceFEPIme (MAXIPIME) 2 g in sodium chloride 0.9 % 100 mL IVPB        2 g 200 mL/hr over 30 Minutes Intravenous  Once 05/01/21 1336 05/01/21 1804      Subjective  The patient is awake, alert, and oriented x 2. He keeps saying  that he has to get back to [place].   Objective   Vitals:  Vitals:   05/24/21 1450 05/24/21 1554  BP:  (!) 135/43  Pulse:  89  Resp:  18  Temp:  98.4 F (36.9 C)  SpO2: 100% 96%   Exam:  Constitutional:  The patient is awake and alert and oriented x 2. No acute distress. Respiratory:  No increased work of breathing. No wheezes, rales, or rhonchi No tactile fremitus Cardiovascular:  Regular rate and rhythm No murmurs, ectopy, or gallups. No lateral PMI. No thrills. Abdomen:  Abdomen is soft, non-tender, non-distended No hernias, masses, or organomegaly Normoactive bowel sounds.  Musculoskeletal:  No cyanosis, clubbing, or edema Skin:  No rashes, lesions, ulcers palpation of skin: no induration or nodules Neurologic:  CN 2-12 intact Sensation all 4 extremities intact Psychiatric:  Mental status Mood, affect appropriate Orientation to person, place, time  judgment and insight appear intact  I have personally reviewed the following:   Today's Data  Vitals  Lab Data  CMP CBC  Micro Data    Imaging  CXR  Cardiology Data  EKG Echocardiogram  Scheduled Meds:  amiodarone  100 mg Per Tube Daily   arformoterol  15 mcg Nebulization BID   aspirin  81 mg Per Tube Daily   atorvastatin  40 mg Per Tube QPM   budesonide (PULMICORT) nebulizer solution  0.5 mg Nebulization BID   carvedilol  25 mg Per Tube BID WC   Chlorhexidine Gluconate Cloth  6 each Topical Daily   docusate  100 mg Per Tube BID   escitalopram  20 mg Per Tube Daily   feeding supplement  237 mL Oral BID BM   feeding supplement (OSMOLITE 1.5 CAL)  1,000 mL Per Tube Q24H   feeding supplement (PROSource TF)  45 mL Per Tube QID   guaiFENesin  5 mL Per Tube Q6H   insulin aspart  0-9 Units Subcutaneous TID WC   insulin glargine-yfgn  15 Units Subcutaneous BID   ipratropium  0.5 mg Nebulization TID   isosorbide dinitrate  10 mg Per Tube BID   levalbuterol  1.25 mg Nebulization TID   mouth rinse   15 mL Mouth Rinse BID   melatonin  3 mg Per Tube QHS   nicotine  14 mg Transdermal Daily   pantoprazole sodium  40 mg Per Tube q1800   polyethylene glycol  17 g Per  Tube Daily   QUEtiapine  100 mg Per Tube QHS   QUEtiapine  25 mg Per Tube Daily   Continuous Infusions:  sodium chloride Stopped (05/19/21 1600)    Principal Problem:   Acute hypercapnic respiratory failure (HCC) Active Problems:   Coronary artery disease   COPD exacerbation (HCC)   Tracheal stenosis   Elevated troponin   Atrial flutter (HCC)   Shock circulatory (HCC)   Influenza A   Malnutrition of moderate degree   LOS: 23 days   A & P  Lower GI bleed -Patient  had 2 large bloody bowel movements 05/20/2021 -Apixaban has been held -Gastroenterology has been consulted. They felt that endoscopy was not in the patient's best interest as bleeding had slowed, and anesthesia would cause risk to the patient given his cardiopulmonary coorbidities. They have recommended CTA abdomen should the patient's bleeding become brisk again. GI has no further plans for any endoscopic procedures -Had colonoscopy earlier this year which showed diverticulosis and internal hemorrhoids -No more further episodes of bleeding, likely self-limited hematochezia -Hemoglobin is stable at 8.1 today.  Decreased level of consciousness: I have reduced doses of sedating medications and reduced the dose of seroquel in the attempt to improve his level of alertness. Monitor.   Acute blood loss anemia -Hemoglobin is 8.1 today improved from 7.6 05/20/2021. -Secondary to above -Follow CBC in a.m., and transfuse for hemoglobin less than 7   Acute on chronic hypoxemic and hypercapnic respiratory failure -Secondary to COPD exacerbation; history of tracheal stenosis -Completed steroid taper -Continue BiPAP nightly -Continue bronchodilators, Pulmicort, Brovana, DuoNeb -Today the patient is saturating 98% on 4 L up from 2L yesterday. Will check CXR.    Acute kidney injury -Likely from hypotension, creatinine went up to 1.32 -Resolved -Today creatinine is 0.99.   Atrial flutter with RVR -Heart rate improved, continue amiodarone, Coreg, digoxin -Pt is still in atrial flutter. -Eliquis will be held in setting of GI bleed.   Acute metabolic encephalopathy -Resolved -Continue Seroquel, Xanax as needed   Diabetes mellitus type 2 -Patient on moderate sliding scale insulin with NovoLog -Patient having episodes of hypoglycemia so dose of Lantus was changed to 25 units subcu twice daily -Sliding scale was changed to sensitive  -CBGs are better  Dysphagia: Patient is on DYS 2 diet with thin liquids.  Poor PO intake: Supplemented by tube feeds due to continued poor PO intake. The patient is awake and alert today. I have wxhorted him to increase his PO intake. Son is at bedside and will also encourage him to eat. Otherwise family will have to decide about PEG tube for long term tube feeds.   Hypotension -Resolved -Midodrine has been ordered on an as needed basis for Systolic BP less than 884. - none has been needed for the last 3 days. Midodrine has been discontinued.  I have seen and examined this patient myself. I have spent 32 minutes in his evaluation and care.   DVT Prophylaxis: SCD's CODE STATUS: Full Code Family Communication : Attempted to reach patient's DIL missy Rodriques. No answer. Disposition: Recommendation has been made for discharge to SNF. Rehab consulted.   Aryana Wonnacott, DO [facility] Direct contact: see [email or web address]  7PM-7AM contact night coverage as above 05/24/2021, 4:31 PM  LOS: 20 days

## 2021-05-24 NOTE — Progress Notes (Signed)
Physical Therapy Treatment Patient Details Name: Ronald [name] MRN: [id number] DOB: [birth year] Today's Date: 05/24/2021   History of Present Illness 75 y/o male who presented on 11/23 w/ SOB with acute respiratory failure requiring intubation in ED, unsuccessful extubation 11/25. Pt successfully extubated on 12/1 with some difficulty tolerating BiPAP. PMHx: COPD, tracheal stenosis, chronic respiratory failure on 2L, CAD, DM II, HTN, HLD    PT Comments    Pt was seen for mobility to sit on side of bed, then stood with [place], and once at chair practiced with walker to stand.  Requires a significant amount of help to power up but once up is demonstrating better control of LE's to remain up.  Focus on his tolerance for standing and initiating some gait during hosp stay, and progress him to being up and out of bed regularly.  Follow for goals of acute PT as ordered.  Pt desatted on stedy to  84% to get to chair but remained stable in 96% range with RW to stand.   Recommendations for follow up therapy are one component of a multi-disciplinary discharge planning process, led by the attending physician.  Recommendations may be updated based on patient status, additional functional criteria and insurance authorization.  Follow Up Recommendations  Skilled nursing-short term rehab (<3 hours/day)     Assistance Recommended at Discharge Frequent or constant Supervision/Assistance  Equipment Recommendations  Rolling walker (2 wheels);BSC/3in1    Recommendations for Other Services OT consult     Precautions / Restrictions Precautions Precautions: Fall;Other (comment) Precaution Comments: cortrak Restrictions Weight Bearing Restrictions: No     Mobility  Bed Mobility Overal bed mobility: Needs Assistance Bed Mobility: Supine to Sit;Rolling Rolling: Mod assist   Supine to sit: Mod assist          Transfers Overall transfer level: Needs assistance Equipment used: Rolling walker (2  wheels);Ambulation equipment used Transfers: Sit to/from Stand Sit to Stand: Mod assist;+2 physical assistance;+2 safety/equipment           General transfer comment: mod assist but once standing supports on BLE's.  Requires mod every trial despite his ability to help with maintaining standing    Ambulation/Gait                   Stairs             Wheelchair Mobility    Modified Rankin (Stroke Patients Only)       Balance Overall balance assessment: Needs assistance Sitting-balance support: Feet supported Sitting balance-Leahy Scale: Fair     Standing balance support: Bilateral upper extremity supported Standing balance-Leahy Scale: Poor                              Cognition Arousal/Alertness: Awake/alert Behavior During Therapy: WFL for tasks assessed/performed Overall Cognitive Status: Impaired/Different from baseline Area of Impairment: Awareness;Safety/judgement;Following commands;Attention                   Current Attention Level: Selective Memory: Decreased recall of precautions;Decreased short-term memory Following Commands: Follows one step commands inconsistently;Follows one step commands with increased time Safety/Judgement: Decreased awareness of deficits Awareness: Intellectual Problem Solving: Slow processing;Requires verbal cues;Requires tactile cues General Comments: pt was fairly calm throughout session, agreed to try to stand on [place] but also on walker with some hesitation        Exercises      General Comments General comments (skin integrity, edema, etc.): pt  is up to stand with stedy then with walker on last attempt.  Desatted on stedy to 84% and recovered to 97%      Pertinent Vitals/Pain Pain Assessment: No/denies pain Breathing: normal Negative Vocalization: none Facial Expression: smiling or inexpressive Body Language: relaxed Consolability: no need to console PAINAD Score: 0    Home Living                           Prior Function            PT Goals (current goals can now be found in the care plan section) Acute Rehab PT Goals Patient Stated Goal: return home Progress towards PT goals: Progressing toward goals    Frequency    Min 2X/week      PT Plan Current plan remains appropriate    Co-evaluation              AM-PAC PT "6 Clicks" Mobility   Outcome Measure  Help needed turning from your back to your side while in a flat bed without using bedrails?: A Lot Help needed moving from lying on your back to sitting on the side of a flat bed without using bedrails?: A Lot Help needed moving to and from a bed to a chair (including a wheelchair)?: A Lot Help needed standing up from a chair using your arms (e.g., wheelchair or bedside chair)?: A Lot Help needed to walk in hospital room?: Total Help needed climbing 3-5 steps with a railing? : Total 6 Click Score: 10    End of Session Equipment Utilized During Treatment: Oxygen Activity Tolerance: Treatment limited secondary to medical complications (Comment);Patient limited by fatigue Patient left: in chair;with call bell/phone within reach;with chair alarm set;with family/visitor present Nurse Communication: Mobility status;Need for lift equipment PT Visit Diagnosis: Unsteadiness on feet (R26.81);Muscle weakness (generalized) (M62.81);Difficulty in walking, not elsewhere classified (R26.2)     Time: 6720-9470 PT Time Calculation (min) (ACUTE ONLY): 28 min  Charges:  $Therapeutic Activity: 23-37 mins               Ramond Dial 05/24/2021, 1:13 PM  Mee Hives, PT PhD Acute Rehab Dept. Number: [place]

## 2021-05-24 NOTE — Progress Notes (Signed)
Pt refused BiPAP for tonight. Pt will call when he needs it.

## 2021-05-25 LAB — CBC WITH DIFFERENTIAL/PLATELET
Abs Immature Granulocytes: 0.03 10*3/uL (ref 0.00–0.07)
Basophils Absolute: 0 10*3/uL (ref 0.0–0.1)
Basophils Relative: 0 %
Eosinophils Absolute: 0.4 10*3/uL (ref 0.0–0.5)
Eosinophils Relative: 6 %
HCT: 28.5 % — ABNORMAL LOW (ref 39.0–52.0)
Hemoglobin: 8.5 g/dL — ABNORMAL LOW (ref 13.0–17.0)
Immature Granulocytes: 0 %
Lymphocytes Relative: 14 %
Lymphs Abs: 0.9 10*3/uL (ref 0.7–4.0)
MCH: 28.7 pg (ref 26.0–34.0)
MCHC: 29.8 g/dL — ABNORMAL LOW (ref 30.0–36.0)
MCV: 96.3 fL (ref 80.0–100.0)
Monocytes Absolute: 0.7 10*3/uL (ref 0.1–1.0)
Monocytes Relative: 10 %
Neutro Abs: 4.8 10*3/uL (ref 1.7–7.7)
Neutrophils Relative %: 70 %
Platelets: 414 10*3/uL — ABNORMAL HIGH (ref 150–400)
RBC: 2.96 MIL/uL — ABNORMAL LOW (ref 4.22–5.81)
RDW: 18.3 % — ABNORMAL HIGH (ref 11.5–15.5)
WBC: 6.8 10*3/uL (ref 4.0–10.5)
nRBC: 0 % (ref 0.0–0.2)

## 2021-05-25 LAB — BASIC METABOLIC PANEL
Anion gap: 4 — ABNORMAL LOW (ref 5–15)
BUN: 26 mg/dL — ABNORMAL HIGH (ref 8–23)
CO2: 31 mmol/L (ref 22–32)
Calcium: 8.2 mg/dL — ABNORMAL LOW (ref 8.9–10.3)
Chloride: 103 mmol/L (ref 98–111)
Creatinine, Ser: 0.96 mg/dL (ref 0.61–1.24)
GFR, Estimated: >60 mL/min (ref >60)
Glucose, Bld: 241 mg/dL — ABNORMAL HIGH (ref 70–99)
Potassium: 5 mmol/L (ref 3.5–5.1)
Sodium: 138 mmol/L (ref 135–145)

## 2021-05-25 LAB — GLUCOSE, CAPILLARY
Glucose-Capillary: 312 mg/dL — ABNORMAL HIGH (ref 70–99)
Glucose-Capillary: 238 mg/dL — ABNORMAL HIGH (ref 70–99)
Glucose-Capillary: 166 mg/dL — ABNORMAL HIGH (ref 70–99)
Glucose-Capillary: 123 mg/dL — ABNORMAL HIGH (ref 70–99)
Glucose-Capillary: 98 mg/dL (ref 70–99)

## 2021-05-25 MED ORDER — HYDRALAZINE HCL 25 MG PO TABS
25.0000 mg | ORAL_TABLET | Freq: Three times a day (TID) | ORAL | Status: DC
  Administered 2021-05-25 – 2021-05-30 (×12): 25 mg via ORAL
  Filled 2021-05-25 (×15): qty 1

## 2021-05-25 MED ORDER — DIGOXIN 125 MCG PO TABS
0.1250 mg | ORAL_TABLET | ORAL | Status: DC
  Administered 2021-05-25: 0.125 mg via ORAL
  Filled 2021-05-25: qty 1

## 2021-05-25 MED ORDER — APIXABAN 5 MG PO TABS
5.0000 mg | ORAL_TABLET | Freq: Two times a day (BID) | ORAL | Status: DC
  Filled 2021-05-25: qty 1

## 2021-05-25 NOTE — Progress Notes (Signed)
Pt had 2.9 seconds of svr. Dr. Arville Go aware no follow up. Louanne Skye

## 2021-05-25 NOTE — Progress Notes (Signed)
Brief Nutrition Note:  Calorie Count ordered by MD. Orders placed.   Currently on Dysphagia 2 diet with supplemental TF. Limited documentation of po intake; noted 1 meal charted yesterday with recorded po intake 10%  Noted poor mental status identified as main barrier to po intake at this time.   Continue Ensure Enlive po BID, each supplement provides 350 kcal and 20 grams of protein Continue nocturnal TF  Kerman Passey MS, RDN, LDN, CNSC Registered Dietitian III Clinical Nutrition RD Pager and On-Call Pager Number Located in [place]

## 2021-05-25 NOTE — Progress Notes (Signed)
Pt refused Bi-PAP for the night. Vitals are stable and pt is informed to call RN if there are any breathing complications that arise. RT will monitor as needed.

## 2021-05-25 NOTE — Progress Notes (Signed)
Paged MD about Patient's vitals and BP, EKG ordered and completed

## 2021-05-25 NOTE — Progress Notes (Signed)
Pt requested PRN xanax for sleep and tylenol for pain. Medication appears effective. Louanne Skye

## 2021-05-25 NOTE — Progress Notes (Signed)
PROGRESS NOTE  Ronald [name] TWS:[id number] DOB: [birth year] DOA: 05/01/2021 PCP: [name], FNP  Brief History   75 year old male with past medical history of COPD, Gold stage III-IV, tracheal stenosis s/p trach in 2018, CAD s/p DES x2 to LAD in 2015, hypertension, hyperlipidemia, chronic respiratory failure on 2 L, HFpEF, diabetes mellitus type 2 presented with shortness of breath.  He was started on steroids and DuoNebs by EMS, placed on CPAP without improvement.  Patient was intubated for airway protection.  PCCM was consulted. Patient had 2 episodes of bright red blood per rectum.  GI was consulted. They evaluated the patient, but felt that colonoscopy in the setting of the patient's active cardiopulmonary comorbidities presented risks due to the need for anesthesia that were best to be avoided. They felt that the patient was not briskly bleeding at this time, and that it made sense to monitor the patient with daily CBC's. They have recommended  CTA of the abdomen as a safer and quicker option to identify the site of bleeding should he start to bleed briskly again. Hemoglobin is stable at 8.3.  He was started on midodrine for hypotension, although it appears that he has not needed it for the last 3 days. I will stop it.  The patient's xanax and seroquel have beenreduced due to the patient's extreme somnolence for the last 2 days. He is awake and alert during the daytime since then.   The patient had a sinus pause on 05/23/2021. Digoxin was held and dig level was checked. It was 1.9 on that day. It was restarted to day at 125 mcg every other day. Continue to monitor with telemetry. Consultants  Gastroenterology  Procedures  None  Antibiotics   Anti-infectives (From admission, onward)    Start     Dose/Rate Route Frequency Ordered Stop   05/06/21 1015  oseltamivir (TAMIFLU) capsule 75 mg        75 mg Per Tube 2 times daily 05/06/21 0917 05/06/21 0937   05/05/21 2100  azithromycin  (ZITHROMAX) tablet 500 mg        500 mg Per Tube Every 24 hr x 2 05/04/21 2219 05/05/21 2134   05/04/21 2100  azithromycin (ZITHROMAX) tablet 500 mg  Status:  Discontinued        500 mg Oral Every 24 hr x 2 05/04/21 1110 05/04/21 2219   05/03/21 2200  oseltamivir (TAMIFLU) capsule 30 mg  Status:  Discontinued        30 mg Per Tube 2 times daily 05/03/21 1048 05/06/21 0917   05/03/21 1330  cefTRIAXone (ROCEPHIN) 2 g in sodium chloride 0.9 % 100 mL IVPB        2 g 200 mL/hr over 30 Minutes Intravenous Every 24 hours 05/03/21 1244 05/09/21 1504   05/03/21 1300  cefTRIAXone (ROCEPHIN) 1 g in sodium chloride 0.9 % 100 mL IVPB  Status:  Discontinued        1 g 200 mL/hr over 30 Minutes Intravenous Every 24 hours 05/03/21 1210 05/03/21 1244   05/01/21 2300  cefTRIAXone (ROCEPHIN) 1 g in sodium chloride 0.9 % 100 mL IVPB  Status:  Discontinued        1 g 200 mL/hr over 30 Minutes Intravenous Every 24 hours 05/01/21 1957 05/02/21 1102   05/01/21 2300  oseltamivir (TAMIFLU) capsule 75 mg  Status:  Discontinued        75 mg Per Tube 2 times daily 05/01/21 2221 05/03/21 1048   05/01/21 2200  ceFEPIme (MAXIPIME) 2 g in sodium chloride 0.9 % 100 mL IVPB  Status:  Discontinued        2 g 200 mL/hr over 30 Minutes Intravenous Every 8 hours 05/01/21 1346 05/01/21 1957   05/01/21 2200  oseltamivir (TAMIFLU) 6 MG/ML suspension 75 mg  Status:  Discontinued        75 mg Per Tube 2 times daily 05/01/21 1957 05/01/21 2221   05/01/21 2100  azithromycin (ZITHROMAX) 500 mg in sodium chloride 0.9 % 250 mL IVPB  Status:  Discontinued        500 mg 250 mL/hr over 60 Minutes Intravenous Every 24 hours 05/01/21 1957 05/04/21 1109   05/01/21 1400  doxycycline (VIBRAMYCIN) 100 mg in sodium chloride 0.9 % 250 mL IVPB  Status:  Discontinued        100 mg 125 mL/hr over 120 Minutes Intravenous Every 12 hours 05/01/21 1336 05/01/21 2018   05/01/21 1345  ceFEPIme (MAXIPIME) 2 g in sodium chloride 0.9 % 100 mL IVPB        2  g 200 mL/hr over 30 Minutes Intravenous  Once 05/01/21 1336 05/01/21 1804      Subjective  The patient is awake, alert, and oriented x 2. No new complaints.  Objective   Vitals:  Vitals:   05/25/21 1455 05/25/21 1551  BP:  (!) 145/65  Pulse:  92  Resp:  13  Temp:  98.6 F (37 C)  SpO2: 95% 96%   Exam:  Constitutional:  The patient is awake and alert and oriented x 2. No acute distress. Respiratory:  No increased work of breathing. No wheezes, rales, or rhonchi No tactile fremitus Cardiovascular:  Regular rate and rhythm No murmurs, ectopy, or gallups. No lateral PMI. No thrills. Abdomen:  Abdomen is soft, non-tender, non-distended No hernias, masses, or organomegaly Normoactive bowel sounds.  Musculoskeletal:  No cyanosis, clubbing, or edema Skin:  No rashes, lesions, ulcers palpation of skin: no induration or nodules Neurologic:  CN 2-12 intact Sensation all 4 extremities intact Psychiatric:  Mental status Mood, affect appropriate Orientation to person, place, time  judgment and insight appear intact  I have personally reviewed the following:   Today's Data  Vitals  Lab Data  CMP CBC Digoxin level  Micro Data    Imaging  CXR  Cardiology Data  EKG Echocardiogram  Scheduled Meds:  amiodarone  100 mg Per Tube Daily   arformoterol  15 mcg Nebulization BID   aspirin  81 mg Per Tube Daily   atorvastatin  40 mg Per Tube QPM   budesonide (PULMICORT) nebulizer solution  0.5 mg Nebulization BID   carvedilol  25 mg Per Tube BID WC   Chlorhexidine Gluconate Cloth  6 each Topical Daily   digoxin  0.125 mg Oral Q48H   docusate  100 mg Per Tube BID   escitalopram  20 mg Per Tube Daily   feeding supplement  237 mL Oral BID BM   feeding supplement (OSMOLITE 1.5 CAL)  1,000 mL Per Tube Q24H   feeding supplement (PROSource TF)  45 mL Per Tube QID   guaiFENesin  5 mL Per Tube Q6H   hydrALAZINE  25 mg Oral Q8H   insulin aspart  0-9 Units Subcutaneous  TID WC   insulin glargine-yfgn  15 Units Subcutaneous BID   ipratropium  0.5 mg Nebulization TID   isosorbide dinitrate  10 mg Per Tube BID   levalbuterol  1.25 mg Nebulization TID   mouth rinse  15 mL Mouth Rinse BID   melatonin  3 mg Per Tube QHS   nicotine  14 mg Transdermal Daily   pantoprazole sodium  40 mg Per Tube q1800   polyethylene glycol  17 g Per Tube Daily   QUEtiapine  100 mg Per Tube QHS   QUEtiapine  25 mg Per Tube Daily   Continuous Infusions:  sodium chloride Stopped (05/19/21 1600)    Principal Problem:   Acute hypercapnic respiratory failure (HCC) Active Problems:   Coronary artery disease   COPD exacerbation (HCC)   Tracheal stenosis   Elevated troponin   Atrial flutter (HCC)   Shock circulatory (HCC)   Influenza A   Malnutrition of moderate degree    LOS: 24 days   A & P  Lower GI bleed -Patient  had 2 large bloody bowel movements 05/20/2021 -Apixaban has been held -Gastroenterology has been consulted. They felt that endoscopy was not in the patient's best interest as bleeding had slowed, and anesthesia would cause risk to the patient given his cardiopulmonary coorbidities. They have recommended CTA abdomen should the patient's bleeding become brisk again. GI has no further plans for any endoscopic procedures -Had colonoscopy earlier this year which showed diverticulosis and internal hemorrhoids -No more further episodes of bleeding, likely self-limited hematochezia -Hemoglobin is stable at 8.1 today.  Decreased level of consciousness: Resolved. I have reduced doses of sedating medications and reduced the dose of seroquel in the attempt to improve his level of alertness. Monitor.   Acute blood loss anemia -Hemoglobin is 8.5 today improved from 7.6 05/20/2021. -Secondary to above -Follow CBC in a.m., and transfuse for hemoglobin less than 7   Acute on chronic hypoxemic and hypercapnic respiratory failure -Secondary to COPD exacerbation; history of  tracheal stenosis -Completed steroid taper -Continue BiPAP nightly -Continue bronchodilators, Pulmicort, Brovana, DuoNeb -Today the patient is saturating 96% on 2L today.   Acute kidney injury -Likely from hypotension, creatinine went up to 1.32.  -Resolved -Today creatinine is 0.96.   Atrial flutter with RVR -Heart rate improved, continue amiodarone, Coreg -Digoxin had been held since 05/23/2021 when the patient had sinus pause and dig level was 1.9. It was 0.9 yesterday. Will restart it today due to elevation of heart rate at 125 mcg every other day. -Pt is still in atrial flutter. -Eliquis will be held in setting of GI bleed.   Acute metabolic encephalopathy -Resolved -Continue Seroquel, Xanax as needed   Diabetes mellitus type 2 -Patient on moderate sliding scale insulin with NovoLog -Patient having episodes of hypoglycemia so dose of Lantus was changed to 25 units subcu twice daily -Sliding scale was changed to sensitive  -CBGs have been 123-312.  Dysphagia: Patient is on DYS 2 diet with thin liquids.  Poor PO intake: Supplemented by tube feeds due to continued poor PO intake. The patient is awake and alert today. I have wxhorted him to increase his PO intake. Son is at bedside and will also encourage him to eat. Otherwise family will have to decide about PEG tube for long term tube feeds. Calorie count is in place.   Hypotension -Resolved -Midodrine has been ordered on an as needed basis for Systolic BP less than 161. - none has been needed for the last 3 days. Midodrine has been discontinued.  I have seen and examined this patient myself. I have spent 35 minutes in his evaluation and care.   DVT Prophylaxis: SCD's CODE STATUS: Full Code Family Communication : Attempted to reach patient's DIL  missy Graven. No answer. Disposition: Recommendation has been made for discharge to SNF. Rehab consulted.   Danett Palazzo, DO [facility] Direct contact: see [email or web address]   7PM-7AM contact night coverage as above 05/25/2021, 4:48 PM  LOS: 20 days

## 2021-05-26 LAB — CBC WITH DIFFERENTIAL/PLATELET
Abs Immature Granulocytes: 0.03 10*3/uL (ref 0.00–0.07)
Basophils Absolute: 0 10*3/uL (ref 0.0–0.1)
Basophils Relative: 0 %
Eosinophils Absolute: 0.3 10*3/uL (ref 0.0–0.5)
Eosinophils Relative: 5 %
HCT: 26.3 % — ABNORMAL LOW (ref 39.0–52.0)
Hemoglobin: 8 g/dL — ABNORMAL LOW (ref 13.0–17.0)
Immature Granulocytes: 1 %
Lymphocytes Relative: 15 %
Lymphs Abs: 1 10*3/uL (ref 0.7–4.0)
MCH: 29.3 pg (ref 26.0–34.0)
MCHC: 30.4 g/dL (ref 30.0–36.0)
MCV: 96.3 fL (ref 80.0–100.0)
Monocytes Absolute: 0.7 10*3/uL (ref 0.1–1.0)
Monocytes Relative: 10 %
Neutro Abs: 4.6 10*3/uL (ref 1.7–7.7)
Neutrophils Relative %: 69 %
Platelets: 373 10*3/uL (ref 150–400)
RBC: 2.73 MIL/uL — ABNORMAL LOW (ref 4.22–5.81)
RDW: 18.6 % — ABNORMAL HIGH (ref 11.5–15.5)
WBC: 6.5 10*3/uL (ref 4.0–10.5)
nRBC: 0 % (ref 0.0–0.2)

## 2021-05-26 LAB — COMPREHENSIVE METABOLIC PANEL
ALT: 15 U/L (ref 0–44)
AST: 12 U/L — ABNORMAL LOW (ref 15–41)
Albumin: 1.9 g/dL — ABNORMAL LOW (ref 3.5–5.0)
Alkaline Phosphatase: 62 U/L (ref 38–126)
Anion gap: 5 (ref 5–15)
BUN: 27 mg/dL — ABNORMAL HIGH (ref 8–23)
CO2: 31 mmol/L (ref 22–32)
Calcium: 7.9 mg/dL — ABNORMAL LOW (ref 8.9–10.3)
Chloride: 103 mmol/L (ref 98–111)
Creatinine, Ser: 0.84 mg/dL (ref 0.61–1.24)
GFR, Estimated: >60 mL/min (ref >60)
Glucose, Bld: 117 mg/dL — ABNORMAL HIGH (ref 70–99)
Potassium: 4.1 mmol/L (ref 3.5–5.1)
Sodium: 139 mmol/L (ref 135–145)
Total Bilirubin: 1.2 mg/dL (ref 0.3–1.2)
Total Protein: 5.2 g/dL — ABNORMAL LOW (ref 6.5–8.1)

## 2021-05-26 LAB — GLUCOSE, CAPILLARY
Glucose-Capillary: 136 mg/dL — ABNORMAL HIGH (ref 70–99)
Glucose-Capillary: 144 mg/dL — ABNORMAL HIGH (ref 70–99)
Glucose-Capillary: 162 mg/dL — ABNORMAL HIGH (ref 70–99)
Glucose-Capillary: 219 mg/dL — ABNORMAL HIGH (ref 70–99)
Glucose-Capillary: 91 mg/dL (ref 70–99)

## 2021-05-26 NOTE — Progress Notes (Signed)
PROGRESS NOTE  Ronald [name] YDX:[id number] DOB: 05/10/1946 DOA: 05/01/2021 PCP: [name], FNP  Brief History   75 year old male with past medical history of COPD, Gold stage III-IV, tracheal stenosis s/p trach in 2018, CAD s/p DES x2 to LAD in 2015, hypertension, hyperlipidemia, chronic respiratory failure on 2 L, HFpEF, diabetes mellitus type 2 presented with shortness of breath.  He was started on steroids and DuoNebs by EMS, placed on CPAP without improvement.  Patient was intubated for airway protection.  PCCM was consulted. Patient had 2 episodes of bright red blood per rectum.  GI was consulted. They evaluated the patient, but felt that colonoscopy in the setting of the patient's active cardiopulmonary comorbidities presented risks due to the need for anesthesia that were best to be avoided. They felt that the patient was not briskly bleeding at this time, and that it made sense to monitor the patient with daily CBC's. They have recommended  CTA of the abdomen as a safer and quicker option to identify the site of bleeding should he start to bleed briskly again. Hemoglobin is stable at 8.3.  He was started on midodrine for hypotension, although it appears that he has not needed it for the last 3 days. I will stop it.  The patient's xanax and seroquel have beenreduced due to the patient's extreme somnolence for the last 2 days. He is awake and alert during the daytime since then.   The patient had a sinus pause on 05/23/2021. Digoxin was held and dig level was checked. It was 1.9 on that day. It was restarted on 05/25/2021 at 125 mcg every other day. This morning the patient had another 2.9 second pause. Digoxin was stopped. Will continue to monitor. Consultants  Gastroenterology  Procedures  None  Antibiotics   Anti-infectives (From admission, onward)    Start     Dose/Rate Route Frequency Ordered Stop   05/06/21 1015  oseltamivir (TAMIFLU) capsule 75 mg        75 mg Per Tube 2  times daily 05/06/21 0917 05/06/21 0937   05/05/21 2100  azithromycin (ZITHROMAX) tablet 500 mg        500 mg Per Tube Every 24 hr x 2 05/04/21 2219 05/05/21 2134   05/04/21 2100  azithromycin (ZITHROMAX) tablet 500 mg  Status:  Discontinued        500 mg Oral Every 24 hr x 2 05/04/21 1110 05/04/21 2219   05/03/21 2200  oseltamivir (TAMIFLU) capsule 30 mg  Status:  Discontinued        30 mg Per Tube 2 times daily 05/03/21 1048 05/06/21 0917   05/03/21 1330  cefTRIAXone (ROCEPHIN) 2 g in sodium chloride 0.9 % 100 mL IVPB        2 g 200 mL/hr over 30 Minutes Intravenous Every 24 hours 05/03/21 1244 05/09/21 1504   05/03/21 1300  cefTRIAXone (ROCEPHIN) 1 g in sodium chloride 0.9 % 100 mL IVPB  Status:  Discontinued        1 g 200 mL/hr over 30 Minutes Intravenous Every 24 hours 05/03/21 1210 05/03/21 1244   05/01/21 2300  cefTRIAXone (ROCEPHIN) 1 g in sodium chloride 0.9 % 100 mL IVPB  Status:  Discontinued        1 g 200 mL/hr over 30 Minutes Intravenous Every 24 hours 05/01/21 1957 05/02/21 1102   05/01/21 2300  oseltamivir (TAMIFLU) capsule 75 mg  Status:  Discontinued        75 mg Per Tube 2  times daily 05/01/21 2221 05/03/21 1048   05/01/21 2200  ceFEPIme (MAXIPIME) 2 g in sodium chloride 0.9 % 100 mL IVPB  Status:  Discontinued        2 g 200 mL/hr over 30 Minutes Intravenous Every 8 hours 05/01/21 1346 05/01/21 1957   05/01/21 2200  oseltamivir (TAMIFLU) 6 MG/ML suspension 75 mg  Status:  Discontinued        75 mg Per Tube 2 times daily 05/01/21 1957 05/01/21 2221   05/01/21 2100  azithromycin (ZITHROMAX) 500 mg in sodium chloride 0.9 % 250 mL IVPB  Status:  Discontinued        500 mg 250 mL/hr over 60 Minutes Intravenous Every 24 hours 05/01/21 1957 05/04/21 1109   05/01/21 1400  doxycycline (VIBRAMYCIN) 100 mg in sodium chloride 0.9 % 250 mL IVPB  Status:  Discontinued        100 mg 125 mL/hr over 120 Minutes Intravenous Every 12 hours 05/01/21 1336 05/01/21 2018   05/01/21 1345   ceFEPIme (MAXIPIME) 2 g in sodium chloride 0.9 % 100 mL IVPB        2 g 200 mL/hr over 30 Minutes Intravenous  Once 05/01/21 1336 05/01/21 1804      Subjective  The patient is awake, alert, and oriented x 2. No new complaints.  Objective   Vitals:  Vitals:   05/26/21 1426 05/26/21 1447  BP: 119/76   Pulse:    Resp:    Temp:    SpO2:  96%   Exam:  Constitutional:  The patient is sleeping this morning. No acute distress. Respiratory:  No increased work of breathing. No wheezes, rales, or rhonchi No tactile fremitus Cardiovascular:  Regular rate and rhythm No murmurs, ectopy, or gallups. No lateral PMI. No thrills. Abdomen:  Abdomen is soft, non-tender, non-distended No hernias, masses, or organomegaly Normoactive bowel sounds.  Musculoskeletal:  No cyanosis, clubbing, or edema Skin:  No rashes, lesions, ulcers palpation of skin: no induration or nodules Neurologic:  CN 2-12 intact Sensation all 4 extremities intact Psychiatric:  Mental status Mood, affect appropriate Orientation to person, place, time  judgment and insight appear intact  I have personally reviewed the following:   Today's Data  Vitals  Lab Data  CMP CBC Digoxin level  Micro Data    Imaging  CXR  Cardiology Data  EKG Echocardiogram  Scheduled Meds:  amiodarone  100 mg Per Tube Daily   arformoterol  15 mcg Nebulization BID   aspirin  81 mg Per Tube Daily   atorvastatin  40 mg Per Tube QPM   budesonide (PULMICORT) nebulizer solution  0.5 mg Nebulization BID   carvedilol  25 mg Per Tube BID WC   Chlorhexidine Gluconate Cloth  6 each Topical Daily   docusate  100 mg Per Tube BID   escitalopram  20 mg Per Tube Daily   feeding supplement  237 mL Oral BID BM   feeding supplement (OSMOLITE 1.5 CAL)  1,000 mL Per Tube Q24H   feeding supplement (PROSource TF)  45 mL Per Tube QID   guaiFENesin  5 mL Per Tube Q6H   hydrALAZINE  25 mg Oral Q8H   insulin aspart  0-9 Units  Subcutaneous TID WC   insulin glargine-yfgn  15 Units Subcutaneous BID   ipratropium  0.5 mg Nebulization TID   isosorbide dinitrate  10 mg Per Tube BID   levalbuterol  1.25 mg Nebulization TID   mouth rinse  15 mL Mouth Rinse BID  melatonin  3 mg Per Tube QHS   nicotine  14 mg Transdermal Daily   pantoprazole sodium  40 mg Per Tube q1800   polyethylene glycol  17 g Per Tube Daily   QUEtiapine  100 mg Per Tube QHS   QUEtiapine  25 mg Per Tube Daily   Continuous Infusions:  sodium chloride Stopped (05/19/21 1600)    Principal Problem:   Acute hypercapnic respiratory failure (HCC) Active Problems:   Coronary artery disease   COPD exacerbation (HCC)   Tracheal stenosis   Elevated troponin   Atrial flutter (HCC)   Shock circulatory (HCC)   Influenza A   Malnutrition of moderate degree    LOS: 25 days   A & P  Lower GI bleed -Patient  had 2 large bloody bowel movements 05/20/2021 -Apixaban has been held -Gastroenterology has been consulted. They felt that endoscopy was not in the patient's best interest as bleeding had slowed, and anesthesia would cause risk to the patient given his cardiopulmonary coorbidities. They have recommended CTA abdomen should the patient's bleeding become brisk again. GI has no further plans for any endoscopic procedures -Had colonoscopy earlier this year which showed diverticulosis and internal hemorrhoids -No more further episodes of bleeding, likely self-limited hematochezia -Hemoglobin is stable at 8.1 today.  Decreased level of consciousness: Patient is again somnolent this am. I have reduced doses of sedating medications and reduced the dose of seroquel in the attempt to improve his level of alertness. Monitor.   Acute blood loss anemia -Hemoglobin is 8.5 today improved from 7.6 05/20/2021. -Secondary to above -Follow CBC in a.m., and transfuse for hemoglobin less than 7   Acute on chronic hypoxemic and hypercapnic respiratory  failure -Secondary to COPD exacerbation; history of tracheal stenosis -Completed steroid taper -Continue BiPAP nightly -Continue bronchodilators, Pulmicort, Brovana, DuoNeb -Today the patient is saturating 96% on 2L today.   Acute kidney injury -Likely from hypotension, creatinine went up to 1.32.  -Resolved -Today creatinine is 0.96.   Atrial flutter with RVR -Heart rate improved, continue amiodarone, Coreg -Digoxin had been held since 05/23/2021 when the patient had sinus pause and dig level was 1.9. It was 0.9 yesterday. Will restart it today due to elevation of heart rate at 125 mcg every other day. -Pt is still in atrial flutter. -Eliquis will be held in setting of GI bleed.   Acute metabolic encephalopathy -Resolved -Continue Seroquel, Xanax as needed   Diabetes mellitus type 2 -Patient on moderate sliding scale insulin with NovoLog -Patient having episodes of hypoglycemia so dose of Lantus was changed to 25 units subcu twice daily -Sliding scale was changed to sensitive  -CBGs have been 123-312.  Dysphagia: Patient is on DYS 2 diet with thin liquids.  Poor PO intake: Supplemented by tube feeds due to continued poor PO intake. The patient is awake and alert today. I have wxhorted him to increase his PO intake. Son is at bedside and will also encourage him to eat. Otherwise family will have to decide about PEG tube for long term tube feeds. Calorie count is in place.   Hypotension -Resolved -Midodrine has been ordered on an as needed basis for Systolic BP less than 132. - none has been needed for the last 3 days. Midodrine has been discontinued.  I have seen and examined this patient myself. I have spent 30 minutes in his evaluation and care.   DVT Prophylaxis: SCD's CODE STATUS: Full Code Family Communication : Attempted to reach patient's DIL missy Buccellato.  No answer. Disposition: Recommendation has been made for discharge to SNF. Rehab consulted.   Ronald Wermuth,  DO [facility] Direct contact: see [email or web address]  7PM-7AM contact night coverage as above 05/26/2021, 3:39 PM  LOS: 20 days

## 2021-05-26 NOTE — Progress Notes (Signed)
Pt refused Bi-PAP for the night. Pt appears to not be in any distress and vitals are stable. RT will continue to monitor as needed.

## 2021-05-27 LAB — BASIC METABOLIC PANEL
Anion gap: 3 — ABNORMAL LOW (ref 5–15)
BUN: 29 mg/dL — ABNORMAL HIGH (ref 8–23)
CO2: 32 mmol/L (ref 22–32)
Calcium: 7.9 mg/dL — ABNORMAL LOW (ref 8.9–10.3)
Chloride: 103 mmol/L (ref 98–111)
Creatinine, Ser: 0.86 mg/dL (ref 0.61–1.24)
GFR, Estimated: >60 mL/min (ref >60)
Glucose, Bld: 193 mg/dL — ABNORMAL HIGH (ref 70–99)
Potassium: 4.2 mmol/L (ref 3.5–5.1)
Sodium: 138 mmol/L (ref 135–145)

## 2021-05-27 LAB — CBC WITH DIFFERENTIAL/PLATELET
Abs Immature Granulocytes: 0.02 10*3/uL (ref 0.00–0.07)
Basophils Absolute: 0 10*3/uL (ref 0.0–0.1)
Basophils Relative: 1 %
Eosinophils Absolute: 0.3 10*3/uL (ref 0.0–0.5)
Eosinophils Relative: 6 %
HCT: 27.7 % — ABNORMAL LOW (ref 39.0–52.0)
Hemoglobin: 8.2 g/dL — ABNORMAL LOW (ref 13.0–17.0)
Immature Granulocytes: 0 %
Lymphocytes Relative: 16 %
Lymphs Abs: 0.9 10*3/uL (ref 0.7–4.0)
MCH: 29.2 pg (ref 26.0–34.0)
MCHC: 29.6 g/dL — ABNORMAL LOW (ref 30.0–36.0)
MCV: 98.6 fL (ref 80.0–100.0)
Monocytes Absolute: 0.6 10*3/uL (ref 0.1–1.0)
Monocytes Relative: 11 %
Neutro Abs: 3.8 10*3/uL (ref 1.7–7.7)
Neutrophils Relative %: 66 %
Platelets: 373 10*3/uL (ref 150–400)
RBC: 2.81 MIL/uL — ABNORMAL LOW (ref 4.22–5.81)
RDW: 18.6 % — ABNORMAL HIGH (ref 11.5–15.5)
WBC: 5.7 10*3/uL (ref 4.0–10.5)
nRBC: 0 % (ref 0.0–0.2)

## 2021-05-27 LAB — GLUCOSE, CAPILLARY
Glucose-Capillary: 204 mg/dL — ABNORMAL HIGH (ref 70–99)
Glucose-Capillary: 242 mg/dL — ABNORMAL HIGH (ref 70–99)
Glucose-Capillary: 205 mg/dL — ABNORMAL HIGH (ref 70–99)
Glucose-Capillary: 157 mg/dL — ABNORMAL HIGH (ref 70–99)
Glucose-Capillary: 84 mg/dL (ref 70–99)
Glucose-Capillary: 148 mg/dL — ABNORMAL HIGH (ref 70–99)

## 2021-05-27 MED ORDER — CARVEDILOL 25 MG PO TABS
37.5000 mg | ORAL_TABLET | Freq: Two times a day (BID) | ORAL | Status: DC
  Administered 2021-05-27 – 2021-06-06 (×16): 37.5 mg
  Filled 2021-05-27 (×19): qty 1

## 2021-05-27 NOTE — Progress Notes (Signed)
PROGRESS NOTE  Ronald [name] FHQ:[id number] DOB: 1945-12-01 DOA: 05/01/2021 PCP: [name], FNP  Brief History   75 year old male with past medical history of COPD, Gold stage III-IV, tracheal stenosis s/p trach in 2018, CAD s/p DES x2 to LAD in 2015, hypertension, hyperlipidemia, chronic respiratory failure on 2 L, HFpEF, diabetes mellitus type 2 presented with shortness of breath.  He was started on steroids and DuoNebs by EMS, placed on CPAP without improvement.  Patient was intubated for airway protection.  PCCM was consulted. Patient had 2 episodes of bright red blood per rectum.  GI was consulted. They evaluated the patient, but felt that colonoscopy in the setting of the patient's active cardiopulmonary comorbidities presented risks due to the need for anesthesia that were best to be avoided. They felt that the patient was not briskly bleeding at this time, and that it made sense to monitor the patient with daily CBC's. They have recommended  CTA of the abdomen as a safer and quicker option to identify the site of bleeding should he start to bleed briskly again. Hemoglobin is stable at 8.3.  He was started on midodrine for hypotension, although it appears that he has not needed it for the last 3 days. I will stop it.  The patient's xanax and seroquel have beenreduced due to the patient's extreme somnolence for the last 2 days. He is awake and alert during the daytime since then.   The patient had a sinus pause on 05/23/2021. Digoxin was held and dig level was checked. It was 1.9 on that day. It was restarted on 05/25/2021 at 125 mcg every other day. This morning the patient had another 2.9 second pause. Digoxin was stopped.  Today HR increased into the 100's and the patient is hypertensive. Will increase Coreg to 37.5 bid. Consultants  Gastroenterology  Procedures  None  Antibiotics   Anti-infectives (From admission, onward)    Start     Dose/Rate Route Frequency Ordered Stop    05/06/21 1015  oseltamivir (TAMIFLU) capsule 75 mg        75 mg Per Tube 2 times daily 05/06/21 0917 05/06/21 0937   05/05/21 2100  azithromycin (ZITHROMAX) tablet 500 mg        500 mg Per Tube Every 24 hr x 2 05/04/21 2219 05/05/21 2134   05/04/21 2100  azithromycin (ZITHROMAX) tablet 500 mg  Status:  Discontinued        500 mg Oral Every 24 hr x 2 05/04/21 1110 05/04/21 2219   05/03/21 2200  oseltamivir (TAMIFLU) capsule 30 mg  Status:  Discontinued        30 mg Per Tube 2 times daily 05/03/21 1048 05/06/21 0917   05/03/21 1330  cefTRIAXone (ROCEPHIN) 2 g in sodium chloride 0.9 % 100 mL IVPB        2 g 200 mL/hr over 30 Minutes Intravenous Every 24 hours 05/03/21 1244 05/09/21 1504   05/03/21 1300  cefTRIAXone (ROCEPHIN) 1 g in sodium chloride 0.9 % 100 mL IVPB  Status:  Discontinued        1 g 200 mL/hr over 30 Minutes Intravenous Every 24 hours 05/03/21 1210 05/03/21 1244   05/01/21 2300  cefTRIAXone (ROCEPHIN) 1 g in sodium chloride 0.9 % 100 mL IVPB  Status:  Discontinued        1 g 200 mL/hr over 30 Minutes Intravenous Every 24 hours 05/01/21 1957 05/02/21 1102   05/01/21 2300  oseltamivir (TAMIFLU) capsule 75 mg  Status:  Discontinued        75 mg Per Tube 2 times daily 05/01/21 2221 05/03/21 1048   05/01/21 2200  ceFEPIme (MAXIPIME) 2 g in sodium chloride 0.9 % 100 mL IVPB  Status:  Discontinued        2 g 200 mL/hr over 30 Minutes Intravenous Every 8 hours 05/01/21 1346 05/01/21 1957   05/01/21 2200  oseltamivir (TAMIFLU) 6 MG/ML suspension 75 mg  Status:  Discontinued        75 mg Per Tube 2 times daily 05/01/21 1957 05/01/21 2221   05/01/21 2100  azithromycin (ZITHROMAX) 500 mg in sodium chloride 0.9 % 250 mL IVPB  Status:  Discontinued        500 mg 250 mL/hr over 60 Minutes Intravenous Every 24 hours 05/01/21 1957 05/04/21 1109   05/01/21 1400  doxycycline (VIBRAMYCIN) 100 mg in sodium chloride 0.9 % 250 mL IVPB  Status:  Discontinued        100 mg 125 mL/hr over 120  Minutes Intravenous Every 12 hours 05/01/21 1336 05/01/21 2018   05/01/21 1345  ceFEPIme (MAXIPIME) 2 g in sodium chloride 0.9 % 100 mL IVPB        2 g 200 mL/hr over 30 Minutes Intravenous  Once 05/01/21 1336 05/01/21 1804      Subjective  The patient is awake, alert, and oriented x 2. No new complaints.  Objective   Vitals:  Vitals:   05/27/21 0806 05/27/21 1427  BP: (!) 156/56 (!) 160/64  Pulse: 99   Resp: (!) 24   Temp: 98.3 F (36.8 C)   SpO2: 98%    Exam:  Constitutional:  The patient is awake, alert and oriented x3.  No acute distress. Respiratory:  No increased work of breathing. No wheezes, rales, or rhonchi No tactile fremitus Cardiovascular:  Regular rate and rhythm No murmurs, ectopy, or gallups. No lateral PMI. No thrills. Abdomen:  Abdomen is soft, non-tender, non-distended No hernias, masses, or organomegaly Normoactive bowel sounds.  Musculoskeletal:  No cyanosis, clubbing, or edema Skin:  No rashes, lesions, ulcers palpation of skin: no induration or nodules Neurologic:  CN 2-12 intact Sensation all 4 extremities intact Psychiatric:  Mental status Mood, affect appropriate Orientation to person, place, time  judgment and insight appear intact  I have personally reviewed the following:   Today's Data  Vitals  Lab Data  CMP CBC  Micro Data    Imaging  CXR  Cardiology Data  EKG Echocardiogram  Scheduled Meds:  amiodarone  100 mg Per Tube Daily   arformoterol  15 mcg Nebulization BID   aspirin  81 mg Per Tube Daily   atorvastatin  40 mg Per Tube QPM   budesonide (PULMICORT) nebulizer solution  0.5 mg Nebulization BID   carvedilol  25 mg Per Tube BID WC   Chlorhexidine Gluconate Cloth  6 each Topical Daily   docusate  100 mg Per Tube BID   escitalopram  20 mg Per Tube Daily   feeding supplement  237 mL Oral BID BM   feeding supplement (OSMOLITE 1.5 CAL)  1,000 mL Per Tube Q24H   feeding supplement (PROSource TF)  45 mL Per  Tube QID   guaiFENesin  5 mL Per Tube Q6H   hydrALAZINE  25 mg Oral Q8H   insulin aspart  0-9 Units Subcutaneous TID WC   insulin glargine-yfgn  15 Units Subcutaneous BID   ipratropium  0.5 mg Nebulization TID   isosorbide dinitrate  10 mg Per  Tube BID   levalbuterol  1.25 mg Nebulization TID   mouth rinse  15 mL Mouth Rinse BID   melatonin  3 mg Per Tube QHS   nicotine  14 mg Transdermal Daily   pantoprazole sodium  40 mg Per Tube q1800   polyethylene glycol  17 g Per Tube Daily   QUEtiapine  100 mg Per Tube QHS   QUEtiapine  25 mg Per Tube Daily   Continuous Infusions:  sodium chloride Stopped (05/19/21 1600)    Principal Problem:   Acute hypercapnic respiratory failure (HCC) Active Problems:   Coronary artery disease   COPD exacerbation (HCC)   Tracheal stenosis   Elevated troponin   Atrial flutter (HCC)   Shock circulatory (HCC)   Influenza A   Malnutrition of moderate degree    LOS: 26 days   A & P  Lower GI bleed -Patient  had 2 large bloody bowel movements 05/20/2021 -Apixaban has been held -Gastroenterology has been consulted. They felt that endoscopy was not in the patient's best interest as bleeding had slowed, and anesthesia would cause risk to the patient given his cardiopulmonary coorbidities. They have recommended CTA abdomen should the patient's bleeding become brisk again. GI has no further plans for any endoscopic procedures -Had colonoscopy earlier this year which showed diverticulosis and internal hemorrhoids -No more further episodes of bleeding, likely self-limited hematochezia -Hemoglobin is stable at 8.2 today.  Decreased level of consciousness: Variable. Improved today. I have reduced doses of sedating medications and reduced the dose of seroquel in the attempt to improve his level of alertness. Monitor.   Acute blood loss anemia -Hemoglobin is 8.2 today improved from 7.6 05/20/2021. -Secondary to above -Follow CBC in a.m., and transfuse for  hemoglobin less than 7   Acute on chronic hypoxemic and hypercapnic respiratory failure -Secondary to COPD exacerbation; history of tracheal stenosis -Completed steroid taper -Continue BiPAP nightly -Continue bronchodilators, Pulmicort, Brovana, DuoNeb -Today the patient is saturating 96% on 3L today.   Acute kidney injury -Likely from hypotension, creatinine went up to 1.32.  -Resolved -Today creatinine is 0.86.   Atrial flutter with RVR -Heart rate improved, continue amiodarone, Coreg -Digoxin stopped. -Due to elevated BP and heart rate have increased dose of coreg. -Pt is still in atrial flutter. -Eliquis will be held in setting of GI bleed.   Acute metabolic encephalopathy -Resolved -Continue Seroquel, Xanax as needed   Diabetes mellitus type 2 -Patient on moderate sliding scale insulin with NovoLog -Patient having episodes of hypoglycemia so dose of Lantus was changed to 25 units subcu twice daily -Sliding scale was changed to sensitive  -CBGs have been 123-312.  Dysphagia: Patient is on DYS 2 diet with thin liquids.  Poor PO intake: Supplemented by tube feeds due to continued poor PO intake. The patient is awake and alert today. I have wxhorted him to increase his PO intake. Son is at bedside and will also encourage him to eat. Otherwise family will have to decide about PEG tube for long term tube feeds. Calorie count is in place.   Hypotension -Resolved -Midodrine has been ordered on an as needed basis for Systolic BP less than 272. - none has been needed for the last 3 days. Midodrine has been discontinued.  I have seen and examined this patient myself. I have spent 34 minutes in his evaluation and care.   DVT Prophylaxis: SCD's CODE STATUS: Full Code Family Communication : Attempted to reach patient's DIL missy Dressel. No answer. Disposition: Recommendation  has been made for discharge to SNF. Rehab consulted.   Prudence Heiny, DO [facility] Direct  contact: see [email or web address]  7PM-7AM contact night coverage as above 05/27/2021, 5:23 PM  LOS: 20 days

## 2021-05-27 NOTE — Progress Notes (Signed)
Mobility Specialist Progress Note   05/27/21 1650  Mobility  Activity  (*Bed Mobility*)  Range of Motion/Exercises Active;All extremities  Level of Assistance Standby assist, set-up cues, supervision of patient - no hands on  Assistive Device None  Mobility Response Tolerated fair  Mobility performed by Mobility specialist  $Mobility charge 1 Mobility   Received pt in bed c/o being tired refusing any OOB mobility but agreeable to Bed Mobility. Session limited to generalized weakness in LE but presenting decent strength in UE. Left w/ call bell in reach.  Holland Falling Mobility Specialist Phone Number [phone]

## 2021-05-27 NOTE — Progress Notes (Signed)
Pt refusing bipap for the night. RT will continue to monitor throughout night as needed.

## 2021-05-27 NOTE — Progress Notes (Signed)
Inpatient Diabetes Program Recommendations  AACE/ADA: New Consensus Statement on Inpatient Glycemic Control (2015)  Target Ranges:  Prepandial:   less than 140 mg/dL      Peak postprandial:   less than 180 mg/dL (1-2 hours)      Critically ill patients:  140 - 180 mg/dL   Lab Results  Component Value Date   GLUCAP 205 (H) 05/27/2021   HGBA1C 7.5 (H) 05/02/2021    Review of Glycemic Control  Latest Reference Range & Units 05/26/21 12:19 05/26/21 16:47 05/26/21 20:33 05/26/21 23:42 05/27/21 03:29 05/27/21 05:52 05/27/21 07:51  Glucose-Capillary 70 - 99 mg/dL 136 (H) 91 144 (H) 162 (H) 204 (H) 242 (H) 205 (H)  Diabetes history: Type 2 DM Outpatient Diabetes medications: none Current orders for Inpatient glycemic control:  Novolog 0-9 units tid with meals Osmolite 50 ml/hr Semglee 15 units bid  Inpatient Diabetes Program Recommendations:    Please change Novolog correction to 0-9 units q 4 hours.   Thanks,  Adah Perl, RN, BC-ADM Inpatient Diabetes Coordinator Pager [phone]  (8a-5p)

## 2021-05-27 NOTE — Progress Notes (Signed)
Occupational Therapy Treatment Patient Details Name: Ronald [name] MRN: [id number] DOB: November 24, 1945 Today's Date: 05/27/2021   History of present illness 75 y/o male who presented on 11/23 w/ SOB with acute respiratory failure requiring intubation in ED, unsuccessful extubation 11/25. Pt successfully extubated on 12/1 with some difficulty tolerating BiPAP. PMHx: COPD, tracheal stenosis, chronic respiratory failure on 2L, CAD, DM II, HTN, HLD   OT comments  This 75 yo male admitted with above seen today to focus on bed mobility, sitting balance, and OOB to recliner (all precursors to more independence with basic ADLs). Pt moving better overall, but still requires extensive A  for all mobility and fatigues quickly.He will continue to benefit from acute OT with follow up at SNF to work back towards PLOF.   Recommendations for follow up therapy are one component of a multi-disciplinary discharge planning process, led by the attending physician.  Recommendations may be updated based on patient status, additional functional criteria and insurance authorization.    Follow Up Recommendations  Skilled nursing-short term rehab (<3 hours/day)    Assistance Recommended at Discharge Frequent or constant Supervision/Assistance  Equipment Recommendations  Other (comment) (TBD next venue)       Precautions / Restrictions Precautions Precautions: Fall Precaution Comments: cortrak Restrictions Weight Bearing Restrictions: No       Mobility Bed Mobility Overal bed mobility: Needs Assistance Bed Mobility: Supine to Sit     Supine to sit: HOB elevated;Max assist          Transfers Overall transfer level: Needs assistance Equipment used:  (sara stedy)               General transfer comment: Did one sit<>stand from raised bed with therapist standing in front of him (pt able to come up 3/4 of the way with Mod A), then switched to [place] stedy with pt being Mod A +2 sit<>stand and rocking for  momentum     Balance Overall balance assessment: Needs assistance Sitting-balance support: No upper extremity supported;Feet supported Sitting balance-Leahy Scale: Fair     Standing balance support: Bilateral upper extremity supported Standing balance-Leahy Scale: Poor                             ADL either performed or assessed with clinical judgement   ADL Overall ADL's : Needs assistance/impaired                         Toilet Transfer: Moderate assistance;+2 for physical assistance Toilet Transfer Details (indicate cue type and reason): sit<>stand from raised bed with sara stedy                Extremity/Trunk Assessment Upper Extremity Assessment Upper Extremity Assessment: Generalized weakness RUE Deficits / Details: edema (worse than left) RUE Coordination: decreased fine motor;decreased gross motor LUE Deficits / Details: edema LUE Coordination: decreased fine motor;decreased gross motor            Vision Patient Visual Report: No change from baseline            Cognition Arousal/Alertness: Awake/alert Behavior During Therapy: WFL for tasks assessed/performed Overall Cognitive Status: Impaired/Different from baseline Area of Impairment: Following commands;Safety/judgement;Problem solving                       Following Commands: Follows one step commands inconsistently;Follows one step commands with increased time Safety/Judgement: Decreased awareness of deficits;Decreased awareness  of safety   Problem Solving: Slow processing;Requires verbal cues;Requires tactile cues General Comments: pt stating he could not stand using the sara stedy; however he had just stood up 3/4 way with me at bedside--with encouragement he did stand in [place] stedy to get to recliner                     Pertinent Vitals/ Pain       Pain Assessment: No/denies pain         Frequency  Min 2X/week        Progress Toward Goals  OT  Goals(current goals can now be found in the care plan section)  Progress towards OT goals: Progressing toward goals (slowly)  Acute Rehab OT Goals Patient Stated Goal: to rest OT Goal Formulation: With patient Time For Goal Achievement: 05/28/21 Potential to Achieve Goals: [place] Discharge plan remains appropriate       AM-PAC OT "6 Clicks" Daily Activity     Outcome Measure   Help from another person eating meals?: A Little (setup) Help from another person taking care of personal grooming?: A Little (setup) Help from another person toileting, which includes using toliet, bedpan, or urinal?: Total (+2 A required) Help from another person bathing (including washing, rinsing, drying)?: A Lot Help from another person to put on and taking off regular upper body clothing?: A Lot Help from another person to put on and taking off regular lower body clothing?: Total 6 Click Score: 12    End of Session Equipment Utilized During Treatment: Oxygen (3 liters)  OT Visit Diagnosis: Other abnormalities of gait and mobility (R26.89);Muscle weakness (generalized) (M62.81);Other symptoms and signs involving cognitive function   Activity Tolerance Patient limited by fatigue   Patient Left in chair;with call bell/phone within reach;with chair alarm set   Nurse Communication Mobility status;Need for lift equipment (RN in room A'ing me with sit<>stand and tranfer with sara stedy)        Time: 7544-9201 OT Time Calculation (min): 32 min  Charges: OT General Charges $OT Visit: 1 Visit OT Treatments $Self Care/Home Management : 23-37 mins  Ronald [name], OTR/L Acute [employer] Pager [phone] Office [phone]    Ronald [name] 05/27/2021, 11:44 AM

## 2021-05-27 NOTE — Progress Notes (Signed)
Calorie Count Note  48 hour calorie count ordered.  Diet: dysphagia 2 with thin liquids  Supplements: Ensure Enlive BID  RD went to follow-up on results of calorie count that was to be conducted over the weekend. No envelope on door and only one meal documented in Epic as 10% completion. Pt likely not eating enough to meet needs PO. Consider placement of PEG if aligned with GOC.   Nutrition Dx: Moderate Malnutrition related to acute illness (influenza A) as evidenced by mild muscle depletion, mild fat depletion. -- ongoing  Goal:   Patient will meet greater than or equal to 90% of their needs -- addressing with supplements and TF  Intervention: Recommend considering placement of PEG for long-term nutrition support if PO intake does not improve   Continue nocturnal TF via Cortrak: Osmolite 1.5 @ 50 ml/h x 12 hours at night (6pm-6am), 600 ml total per night Prosource TF 45 ml QID   Provides 1060 kcal (48 % of estimated needs), 82 gm protein (68% of estimated needs), 459 ml free water daily.   Recommend discontinue TF when patient is eating >/= 75% of meals consistently.   Continue MVI with minerals daily via tube.   Continue Ensure Enlive po BID, each supplement provides 350 kcal and 20 grams of protein.     Ronald [name]., MS, RD, LDN (she/her/hers) RD pager number and weekend/on-call pager number located in [place].

## 2021-05-27 NOTE — Plan of Care (Signed)
°  Problem: Clinical Measurements: Goal: Respiratory complications will improve Outcome: Progressing   Problem: Coping: Goal: Level of anxiety will decrease Outcome: Progressing   Problem: Elimination: Goal: Will not experience complications related to urinary retention Outcome: Progressing   Problem: Pain Managment: Goal: General experience of comfort will improve Outcome: Progressing   

## 2021-05-28 LAB — GLUCOSE, CAPILLARY
Glucose-Capillary: 187 mg/dL — ABNORMAL HIGH (ref 70–99)
Glucose-Capillary: 134 mg/dL — ABNORMAL HIGH (ref 70–99)
Glucose-Capillary: 87 mg/dL (ref 70–99)
Glucose-Capillary: 140 mg/dL — ABNORMAL HIGH (ref 70–99)

## 2021-05-28 MED ORDER — IPRATROPIUM BROMIDE 0.02 % IN SOLN
0.5000 mg | Freq: Two times a day (BID) | RESPIRATORY_TRACT | Status: DC
  Administered 2021-05-28 – 2021-05-29 (×3): 0.5 mg via RESPIRATORY_TRACT
  Filled 2021-05-28 (×3): qty 2.5

## 2021-05-28 MED ORDER — LEVALBUTEROL HCL 1.25 MG/0.5ML IN NEBU
1.2500 mg | INHALATION_SOLUTION | Freq: Two times a day (BID) | RESPIRATORY_TRACT | Status: DC
  Administered 2021-05-28 – 2021-05-29 (×3): 1.25 mg via RESPIRATORY_TRACT
  Filled 2021-05-28 (×4): qty 0.5

## 2021-05-28 NOTE — Progress Notes (Signed)
Progress Note    DANNEL RAFTER   JSH:[id number]  DOB: October 02, 1945  DOA: 05/01/2021     27 PCP: [name], FNP  Initial CC: SOB  Hospital Course: Mr. Fosdick is a 75 year old male with past medical history of COPD, Gold stage III-IV, tracheal stenosis s/p trach in 2018, CAD s/p DES x2 to LAD in 2015, hypertension, hyperlipidemia, chronic respiratory failure on 2 L, HFpEF, diabetes mellitus type 2 presented with shortness of breath.  He was started on steroids and DuoNebs by EMS, placed on CPAP without improvement.  Patient was intubated for airway protection.  PCCM was consulted. Patient had 2 episodes of bright red blood per rectum.  GI was consulted. They evaluated the patient, but felt that colonoscopy in the setting of the patient's active cardiopulmonary comorbidities presented risks due to the need for anesthesia that were best to be avoided.  Recommendation was for ongoing monitoring of hemoglobin and clinical status; if rebleed occurs, then to obtain CT angio abdomen/pelvis.  Patient now awaiting improvement of his nutritional intake in order for NG tube tube feeds to be stopped and SNF placement able to be pursued.  Interval History:  No events overnight.  Son and daughter-in-law present this morning.  Patient was attempting to eat some when seen.  He was comfortable and had no concerns.  Assessment & Plan: Lower GI bleed Acute blood loss anemia -Patient  had 2 large bloody bowel movements 05/20/2021 -Apixaban has been held; discussed with family regarding risks/benefits; for now continue to hold - if rebleed for Hgb drop then stat CTA A/P -Colonoscopy earlier this year showed diverticulosis and internal hemorrhoids  Decreased level of consciousness - Reported to be variable and intermittent - Caution with sedating medications  Poor PO intake - Supplemented by tube feeds due to continued poor PO intake.  - continue calorie count and d/c NGT/TF as able - doubt  additional meds will benefit appetite stimulation   Acute on chronic hypoxemic and hypercapnic respiratory failure -Secondary to COPD exacerbation; history of tracheal stenosis -Completed steroid taper -Continue BiPAP nightly -Continue bronchodilators, Pulmicort, Brovana, DuoNeb - encourage incentive spirometer use    Acute kidney injury -Likely from hypotension, creatinine went up to 1.32.  -Resolved   Atrial flutter with RVR -Heart rate improved, continue amiodarone, Coreg -Eliquis will be held in setting of GI bleed.   Acute metabolic encephalopathy -Resolved -Continue Seroquel, Xanax as needed   Diabetes mellitus type 2 -Last A1c 7.5% on 05/02/2021 -Patient on moderate sliding scale insulin with NovoLog -Patient having episodes of hypoglycemia so dose of Lantus was changed to 25 units subcu twice daily; if further hypoglycemia, consider further de-escalation of Lantus dosing -Sliding scale was changed to sensitive   Dysphagia: Patient is on DYS 2 diet with thin liquids.   Hypotension -Resolved -Midodrine has been ordered on an as needed basis for Systolic BP less than 850. - none has been needed for the last 3 days. Midodrine has been discontinued  Old records reviewed in assessment of this patient  Antimicrobials:   DVT prophylaxis: SCD  Code Status:   Code Status: Full Code  Disposition Plan:   Status is: Inpt  Objective: Blood pressure (!) 133/43, pulse 88, temperature 98.3 F (36.8 C), temperature source Oral, resp. rate 17, weight 88.6 kg, SpO2 93 %.  Examination:  Physical Exam Constitutional:      General: He is not in acute distress.    Appearance: Normal appearance.  HENT:     Head:  Normocephalic and atraumatic.     Nose:     Comments: NGT in place    Mouth/Throat:     Mouth: Mucous membranes are moist.  Eyes:     Extraocular Movements: Extraocular movements intact.  Cardiovascular:     Rate and Rhythm: Normal rate.     Heart sounds: Normal  heart sounds.  Pulmonary:     Effort: No respiratory distress.     Breath sounds: No wheezing.     Comments: Poor air movement Abdominal:     General: Bowel sounds are normal. There is no distension.     Palpations: Abdomen is soft.     Tenderness: There is no abdominal tenderness.  Musculoskeletal:        General: Normal range of motion.     Cervical back: Normal range of motion and neck supple.  Skin:    General: Skin is warm and dry.  Neurological:     General: No focal deficit present.     Mental Status: He is alert.     Comments: Grossly deconditioned   Psychiatric:        Mood and Affect: Mood normal.        Behavior: Behavior normal.     Consultants:    Procedures:    Data Reviewed: I have personally reviewed labs and imaging studies    LOS: 27 days  Time spent: Greater than 50% of the 35 minute visit was spent in counseling/coordination of care for the patient as laid out in the A&P.   Dwyane Dee, MD [facility] 05/28/2021, 3:37 PM

## 2021-05-28 NOTE — Progress Notes (Signed)
Physical Therapy Treatment Patient Details Name: Ronald [name] MRN: [id number] DOB: July 31, 1945 Today's Date: 05/28/2021   History of Present Illness 75 y/o male who presented on 11/23 w/ SOB with acute respiratory failure requiring intubation in ED, unsuccessful extubation 11/25. Pt successfully extubated on 12/1 with some difficulty tolerating BiPAP. PMHx: COPD, tracheal stenosis, chronic respiratory failure on 2L, CAD, DM II, HTN, HLD    PT Comments    Patient not progressing with mobility today. Requires Max A for bed mobility with increased time/cues and encouragement. Once sitting EOB, declined standing attempt with stedy and became quickly upset/irritated. Sp02 dropped to 87% on 3L/min 02 [place] sitting EOB with 3/4 DOE. Fatigues very quickly. Difficulty sittign upright due to poor tolerance favoring left lateral lean. Despite encouragement for increasing mobility and OOB, pt refusing. Propped RUE on pillow due to edema. Will follow.   Recommendations for follow up therapy are one component of a multi-disciplinary discharge planning process, led by the attending physician.  Recommendations may be updated based on patient status, additional functional criteria and insurance authorization.  Follow Up Recommendations  Skilled nursing-short term rehab (<3 hours/day)     Assistance Recommended at Discharge Frequent or constant Supervision/Assistance  Equipment Recommendations  Rolling walker (2 wheels);BSC/3in1    Recommendations for Other Services       Precautions / Restrictions Precautions Precautions: Fall;Other (comment) Precaution Comments: cortrak Restrictions Weight Bearing Restrictions: No     Mobility  Bed Mobility Overal bed mobility: Needs Assistance Bed Mobility: Supine to Sit;Sit to Supine     Supine to sit: HOB elevated;Max assist Sit to supine: Mod assist;+2 for physical assistance;HOB elevated   General bed mobility comments: Assist with LEs, trunk and scooting  bottom to EOB, minimal effort. Fatigues very quickly. Assist to bring lEs back into bed.    Transfers                   General transfer comment: Got stedy all ready in front of patietn and then he refused to attempt standing and got upset    Ambulation/Gait                   Stairs             Wheelchair Mobility    Modified Rankin (Stroke Patients Only)       Balance Overall balance assessment: Needs assistance Sitting-balance support: No upper extremity supported;Feet supported Sitting balance-Leahy Scale: Poor Sitting balance - Comments: Leaning on left UE, difficulty sitting upright fatiguing and falling back on UE to rest. Postural control: Left lateral lean                                  Cognition Arousal/Alertness: Awake/alert Behavior During Therapy: WFL for tasks assessed/performed Overall Cognitive Status: Impaired/Different from baseline Area of Impairment: Following commands;Safety/judgement;Problem solving                       Following Commands: Follows one step commands inconsistently;Follows one step commands with increased time Safety/Judgement: Decreased awareness of deficits;Decreased awareness of safety   Problem Solving: Slow processing;Requires verbal cues;Requires tactile cues;Decreased initiation          Exercises      General Comments General comments (skin integrity, edema, etc.): Sp02 dropped to 87% on 3L/min 02 [place] sitting EOB.      Pertinent Vitals/Pain Pain Assessment: Faces Faces Pain Scale:  Hurts even more Pain Location: RLE Pain Descriptors / Indicators: Discomfort;Sore;Grimacing;Constant Pain Intervention(s): Monitored during session;Limited activity within patient's tolerance    Home Living                          Prior Function            PT Goals (current goals can now be found in the care plan section) Acute Rehab PT Goals Patient Stated Goal: return  home PT Goal Formulation: With patient Time For Goal Achievement: 06/11/21 Potential to Achieve Goals: Fair Progress towards PT goals: Not progressing toward goals - comment (cognition, fatigue, unwillingness)    Frequency    Min 2X/week      PT Plan Current plan remains appropriate    Co-evaluation              AM-PAC PT "6 Clicks" Mobility   Outcome Measure  Help needed turning from your back to your side while in a flat bed without using bedrails?: A Lot Help needed moving from lying on your back to sitting on the side of a flat bed without using bedrails?: A Lot Help needed moving to and from a bed to a chair (including a wheelchair)?: Total Help needed standing up from a chair using your arms (e.g., wheelchair or bedside chair)?: Total Help needed to walk in hospital room?: Total Help needed climbing 3-5 steps with a railing? : Total 6 Click Score: 8    End of Session Equipment Utilized During Treatment: Oxygen Activity Tolerance: Patient limited by fatigue;Other (comment) (cognitive deficits, unwillingness) Patient left: in bed;with call bell/phone within reach;with bed alarm set Nurse Communication: Mobility status;Need for lift equipment PT Visit Diagnosis: Unsteadiness on feet (R26.81);Muscle weakness (generalized) (M62.81);Difficulty in walking, not elsewhere classified (R26.2);Pain Pain - Right/Left: Right Pain - part of body: Leg     Time: 3825-0539 PT Time Calculation (min) (ACUTE ONLY): 21 min  Charges:  $Therapeutic Activity: 8-22 mins                     Marisa Severin, PT, DPT Acute Rehabilitation Services Pager [phone] Office [place] 05/28/2021, 2:59 PM

## 2021-05-28 NOTE — Plan of Care (Signed)
°  Problem: Clinical Measurements: Goal: Respiratory complications will improve Outcome: Progressing   Problem: Clinical Measurements: Goal: Cardiovascular complication will be avoided Outcome: Progressing   Problem: Nutrition: Goal: Adequate nutrition will be maintained Outcome: Progressing   Problem: Elimination: Goal: Will not experience complications related to urinary retention Outcome: Progressing

## 2021-05-28 NOTE — Plan of Care (Signed)
°  Problem: Health Behavior/Discharge Planning: Goal: Ability to manage health-related needs will improve Outcome: Progressing   Problem: Clinical Measurements: Goal: Cardiovascular complication will be avoided Outcome: Progressing   Problem: Coping: Goal: Level of anxiety will decrease Outcome: Progressing

## 2021-05-29 ENCOUNTER — Inpatient Hospital Stay (HOSPITAL_COMMUNITY): Payer: Medicare HMO

## 2021-05-29 DIAGNOSIS — E440 Moderate protein-calorie malnutrition: Secondary | ICD-10-CM

## 2021-05-29 DIAGNOSIS — M79604 Pain in right leg: Secondary | ICD-10-CM

## 2021-05-29 LAB — GLUCOSE, CAPILLARY
Glucose-Capillary: 154 mg/dL — ABNORMAL HIGH (ref 70–99)
Glucose-Capillary: 132 mg/dL — ABNORMAL HIGH (ref 70–99)
Glucose-Capillary: 89 mg/dL (ref 70–99)
Glucose-Capillary: 122 mg/dL — ABNORMAL HIGH (ref 70–99)

## 2021-05-29 NOTE — Progress Notes (Signed)
[facility] PROGRESS NOTE   Ronald [name] ACZ:[id number] DOB: 08-Aug-1945 DOA: 05/01/2021  PCP: [name], FNP  Brief History/Interval Summary: Ronald [name] is a 75 year old male with past medical history of COPD, Gold stage III-IV, tracheal stenosis s/p trach in 2018, CAD s/p DES x2 to LAD in 2015, hypertension, hyperlipidemia, chronic respiratory failure on 2 L, HFpEF, diabetes mellitus type 2 presented with shortness of breath.  He was started on steroids and DuoNebs by EMS, placed on CPAP without improvement.  Patient was intubated for airway protection.  Was initially admitted to intensive care unit. Patient had 2 episodes of bright red blood per rectum.  GI was consulted. They evaluated the patient, but felt that colonoscopy in the setting of the patient's active cardiopulmonary comorbidities presented risks due to the need for anesthesia that were best to be avoided.  Recommendation was for ongoing monitoring of hemoglobin and clinical status; if rebleed occurs, then to obtain CT angio abdomen/pelvis.   Patient now awaiting improvement of his nutritional intake in order for NG tube tube feeds to be stopped and SNF placement able to be pursued.   Consultants: Critical care medicine Gastroenterology  Procedures: None reported    Subjective/Interval History: Patient complains of pain in the right thigh area.  Denies any nausea vomiting.  Trying his best to eat his meals by mouth.  Understands that he may need to undergo PEG tube placement if his oral intake remains poor.  He is agreeable to the same if necessary.     Assessment/Plan:  Lower GI bleed/acute blood loss anemia -Patient  had 2 large bloody bowel movements 05/20/2021 -Apixaban has been held; discussed with family regarding risks/benefits; for now continue to hold - if rebleed for Hgb drop then stat CTA A/P -Colonoscopy earlier this year showed diverticulosis and internal hemorrhoids Was also seen by  gastroenterology who recommended monitoring. Seems to be stable for the most part without recurrence of bleeding.  Hemoglobin has been stable.   Decreased level of consciousness - Reported to be variable and intermittent - Caution with sedating medications Mentation appears to be stable this morning.   Poor PO intake - Supplemented by tube feeds due to continued poor PO intake.  - continue calorie count and d/c NGT/TF as able Per dietitian ordered.  Intake remains poor.  Calorie count is in progress.  If patient's oral intake did not significantly improve we will need to pursue PEG tube to which he is agreeable.    Acute on chronic hypoxemic and hypercapnic respiratory failure -Secondary to COPD exacerbation; history of tracheal stenosis -Completed steroid taper -Continue BiPAP nightly -Continue bronchodilators, Pulmicort, Brovana, DuoNeb - encourage incentive spirometer use  Respiratory status is stable.  Right leg pain Patient complains of pain in the right thigh and hip area.  He underwent hip x-ray on 12/9 which did not show any injury.  We will also do x-ray of his femur and knee just to be sure there is no injury.   Acute kidney injury -Likely from hypotension, creatinine went up to 1.32.  -Resolved   Atrial flutter with RVR -Heart rate improved, continue amiodarone, Coreg -Eliquis held due to significant drop lower GI bleed.   Acute metabolic encephalopathy -Resolved -Continue Seroquel, Xanax as needed   Diabetes mellitus type 2 -Last A1c 7.5% on 05/02/2021 -Patient on moderate sliding scale insulin with NovoLog -Dose of Lantus was decreased due to episodes of hypoglycemia.  CBGs noted to be stable for the most part.  Continue  to monitor.     Dysphagia Patient is on DYS 2 diet with thin liquids.   Hypotension -Resolved -Midodrine has been ordered on an as needed basis for Systolic BP less than 767.  Moderate protein calorie malnutrition Nutrition Problem:  Moderate Malnutrition Etiology: acute illness (influenza A)  Signs/Symptoms: mild muscle depletion, mild fat depletion  Interventions: Tube feeding   DVT Prophylaxis: SCDs Code Status: Full code Family Communication: Discussed with patient.  No family at bedside Disposition Plan: Skilled nursing facility when medically stable  Status is: Inpatient  Remains inpatient appropriate because: Poor oral intake.  Requiring tube feedings      Medications: Scheduled:  amiodarone  100 mg Per Tube Daily   arformoterol  15 mcg Nebulization BID   aspirin  81 mg Per Tube Daily   atorvastatin  40 mg Per Tube QPM   budesonide (PULMICORT) nebulizer solution  0.5 mg Nebulization BID   carvedilol  37.5 mg Per Tube BID WC   Chlorhexidine Gluconate Cloth  6 each Topical Daily   docusate  100 mg Per Tube BID   escitalopram  20 mg Per Tube Daily   feeding supplement  237 mL Oral BID BM   feeding supplement (OSMOLITE 1.5 CAL)  1,000 mL Per Tube Q24H   feeding supplement (PROSource TF)  45 mL Per Tube QID   guaiFENesin  5 mL Per Tube Q6H   hydrALAZINE  25 mg Oral Q8H   insulin aspart  0-9 Units Subcutaneous TID WC   insulin glargine-yfgn  15 Units Subcutaneous BID   ipratropium  0.5 mg Nebulization BID   isosorbide dinitrate  10 mg Per Tube BID   levalbuterol  1.25 mg Nebulization BID   mouth rinse  15 mL Mouth Rinse BID   melatonin  3 mg Per Tube QHS   nicotine  14 mg Transdermal Daily   pantoprazole sodium  40 mg Per Tube q1800   polyethylene glycol  17 g Per Tube Daily   QUEtiapine  100 mg Per Tube QHS   QUEtiapine  25 mg Per Tube Daily   Continuous:  sodium chloride Stopped (05/19/21 1600)   MCN:[id number], ALPRAZolam, bisacodyl, hydrALAZINE, ondansetron (ZOFRAN) IV, QUEtiapine, sennosides  Antibiotics: Anti-infectives (From admission, onward)    Start     Dose/Rate Route Frequency Ordered Stop   05/06/21 1015  oseltamivir (TAMIFLU) capsule 75 mg        75 mg Per Tube 2 times  daily 05/06/21 0917 05/06/21 0937   05/05/21 2100  azithromycin (ZITHROMAX) tablet 500 mg        500 mg Per Tube Every 24 hr x 2 05/04/21 2219 05/05/21 2134   05/04/21 2100  azithromycin (ZITHROMAX) tablet 500 mg  Status:  Discontinued        500 mg Oral Every 24 hr x 2 05/04/21 1110 05/04/21 2219   05/03/21 2200  oseltamivir (TAMIFLU) capsule 30 mg  Status:  Discontinued        30 mg Per Tube 2 times daily 05/03/21 1048 05/06/21 0917   05/03/21 1330  cefTRIAXone (ROCEPHIN) 2 g in sodium chloride 0.9 % 100 mL IVPB        2 g 200 mL/hr over 30 Minutes Intravenous Every 24 hours 05/03/21 1244 05/09/21 1504   05/03/21 1300  cefTRIAXone (ROCEPHIN) 1 g in sodium chloride 0.9 % 100 mL IVPB  Status:  Discontinued        1 g 200 mL/hr over 30 Minutes Intravenous Every 24 hours 05/03/21 1210  05/03/21 1244   05/01/21 2300  cefTRIAXone (ROCEPHIN) 1 g in sodium chloride 0.9 % 100 mL IVPB  Status:  Discontinued        1 g 200 mL/hr over 30 Minutes Intravenous Every 24 hours 05/01/21 1957 05/02/21 1102   05/01/21 2300  oseltamivir (TAMIFLU) capsule 75 mg  Status:  Discontinued        75 mg Per Tube 2 times daily 05/01/21 2221 05/03/21 1048   05/01/21 2200  ceFEPIme (MAXIPIME) 2 g in sodium chloride 0.9 % 100 mL IVPB  Status:  Discontinued        2 g 200 mL/hr over 30 Minutes Intravenous Every 8 hours 05/01/21 1346 05/01/21 1957   05/01/21 2200  oseltamivir (TAMIFLU) 6 MG/ML suspension 75 mg  Status:  Discontinued        75 mg Per Tube 2 times daily 05/01/21 1957 05/01/21 2221   05/01/21 2100  azithromycin (ZITHROMAX) 500 mg in sodium chloride 0.9 % 250 mL IVPB  Status:  Discontinued        500 mg 250 mL/hr over 60 Minutes Intravenous Every 24 hours 05/01/21 1957 05/04/21 1109   05/01/21 1400  doxycycline (VIBRAMYCIN) 100 mg in sodium chloride 0.9 % 250 mL IVPB  Status:  Discontinued        100 mg 125 mL/hr over 120 Minutes Intravenous Every 12 hours 05/01/21 1336 05/01/21 2018   05/01/21 1345   ceFEPIme (MAXIPIME) 2 g in sodium chloride 0.9 % 100 mL IVPB        2 g 200 mL/hr over 30 Minutes Intravenous  Once 05/01/21 1336 05/01/21 1804       Objective:  Vital Signs  Vitals:   05/29/21 0640 05/29/21 0747 05/29/21 0805 05/29/21 0847  BP: (!) 165/56 (!) 168/67 (!) 174/48 (!) 174/48  Pulse:  100  92  Resp:  13  14  Temp:  97.9 F (36.6 C)    TempSrc:  Oral    SpO2:  94%  92%  Weight:        Intake/Output Summary (Last 24 hours) at 05/29/2021 1032 Last data filed at 05/28/2021 1801 Gross per 24 hour  Intake --  Output 600 ml  Net -600 ml   Filed Weights   05/16/21 0500 05/18/21 0417 05/20/21 0441  Weight: 84.4 kg 88.6 kg 88.6 kg    General appearance: Awake alert.  In no distress Resp: Clear to auscultation bilaterally.  Normal effort Cardio: S1-S2 is normal regular.  No S3-S4.  No rubs murmurs or bruit GI: Abdomen is soft.  Nontender nondistended.  Bowel sounds are present normal.  No masses organomegaly Extremities: Bruising noted to the right thigh area medially.  Restricted range of motion of the right hip noted.  No swelling of the knee joint appreciated. Neurologic:  No focal neurological deficits.    Lab Results:  Data Reviewed: I have personally reviewed following labs and imaging studies  CBC: Recent Labs  Lab 05/23/21 0052 05/24/21 1019 05/25/21 0229 05/26/21 0146 05/27/21 0219  WBC 6.7 7.5 6.8 6.5 5.7  NEUTROABS 5.1  --  4.8 4.6 3.8  HGB 8.1* 8.1* 8.5* 8.0* 8.2*  HCT 26.2* 26.5* 28.5* 26.3* 27.7*  MCV 96.7 97.1 96.3 96.3 98.6  PLT 344 368 414* 373 371    Basic Metabolic Panel: Recent Labs  Lab 05/23/21 0052 05/24/21 1019 05/25/21 0229 05/26/21 0146 05/27/21 0219  NA 139 137 138 139 138  K 4.7 5.0 5.0 4.1 4.2  CL 105 105  103 103 103  CO2 29 28 31 31  32  GLUCOSE 162* 218* 241* 117* 193*  BUN 23 27* 26* 27* 29*  CREATININE 0.83 0.92 0.96 0.84 0.86  CALCIUM 7.9* 8.0* 8.2* 7.9* 7.9*  MG 2.1  --   --   --   --      GFR: Estimated Creatinine Clearance: 84.3 mL/min (by C-G formula based on SCr of 0.86 mg/dL).  Liver Function Tests: Recent Labs  Lab 05/23/21 0052 05/26/21 0146  AST 12* 12*  ALT 16 15  ALKPHOS 68 62  BILITOT 1.4* 1.2  PROT 5.3* 5.2*  ALBUMIN 2.0* 1.9*     CBG: Recent Labs  Lab 05/28/21 0508 05/28/21 1112 05/28/21 1601 05/28/21 2021 05/29/21 0548  GLUCAP 187* 134* 87 140* 154*     Radiology Studies: No results found.     LOS: 28 days   Ronald [name] [employer] on [email or web address]  05/29/2021, 10:32 AM

## 2021-05-29 NOTE — Progress Notes (Signed)
Patient refused BIPAP again. Unit still in room if needed. On 3lpm nasal cannula.

## 2021-05-29 NOTE — Care Management Important Message (Signed)
Important Message  Patient Details  Name: Ronald [name] MRN: [id number] Date of Birth: 02/25/1946   Medicare Important Message Given:  Yes     Hannah Beat 05/29/2021, 1:49 PM

## 2021-05-29 NOTE — Plan of Care (Signed)
  Problem: Activity: Goal: Risk for activity intolerance will decrease Outcome: Progressing   Problem: Safety: Goal: Ability to remain free from injury will improve Outcome: Progressing   Problem: Skin Integrity: Goal: Risk for impaired skin integrity will decrease Outcome: Progressing   

## 2021-05-30 ENCOUNTER — Inpatient Hospital Stay (HOSPITAL_COMMUNITY): Payer: Medicare HMO

## 2021-05-30 DIAGNOSIS — I4892 Unspecified atrial flutter: Secondary | ICD-10-CM | POA: Diagnosis not present

## 2021-05-30 DIAGNOSIS — J449 Chronic obstructive pulmonary disease, unspecified: Secondary | ICD-10-CM | POA: Diagnosis not present

## 2021-05-30 DIAGNOSIS — E440 Moderate protein-calorie malnutrition: Secondary | ICD-10-CM | POA: Diagnosis not present

## 2021-05-30 DIAGNOSIS — M79604 Pain in right leg: Secondary | ICD-10-CM | POA: Diagnosis not present

## 2021-05-30 LAB — CBC
HCT: 29.7 % — ABNORMAL LOW (ref 39.0–52.0)
Hemoglobin: 9.1 g/dL — ABNORMAL LOW (ref 13.0–17.0)
MCH: 29.4 pg (ref 26.0–34.0)
MCHC: 30.6 g/dL (ref 30.0–36.0)
MCV: 95.8 fL (ref 80.0–100.0)
Platelets: 356 10*3/uL (ref 150–400)
RBC: 3.1 MIL/uL — ABNORMAL LOW (ref 4.22–5.81)
RDW: 18.3 % — ABNORMAL HIGH (ref 11.5–15.5)
WBC: 5.5 10*3/uL (ref 4.0–10.5)
nRBC: 0 % (ref 0.0–0.2)

## 2021-05-30 LAB — BASIC METABOLIC PANEL
Anion gap: 4 — ABNORMAL LOW (ref 5–15)
BUN: 21 mg/dL (ref 8–23)
CO2: 29 mmol/L (ref 22–32)
Calcium: 7.7 mg/dL — ABNORMAL LOW (ref 8.9–10.3)
Chloride: 100 mmol/L (ref 98–111)
Creatinine, Ser: 0.71 mg/dL (ref 0.61–1.24)
GFR, Estimated: >60 mL/min (ref >60)
Glucose, Bld: 203 mg/dL — ABNORMAL HIGH (ref 70–99)
Potassium: 4.4 mmol/L (ref 3.5–5.1)
Sodium: 133 mmol/L — ABNORMAL LOW (ref 135–145)

## 2021-05-30 LAB — GLUCOSE, CAPILLARY
Glucose-Capillary: 174 mg/dL — ABNORMAL HIGH (ref 70–99)
Glucose-Capillary: 203 mg/dL — ABNORMAL HIGH (ref 70–99)
Glucose-Capillary: 146 mg/dL — ABNORMAL HIGH (ref 70–99)
Glucose-Capillary: 100 mg/dL — ABNORMAL HIGH (ref 70–99)
Glucose-Capillary: 176 mg/dL — ABNORMAL HIGH (ref 70–99)

## 2021-05-30 MED ORDER — OSMOLITE 1.5 CAL PO LIQD
900.0000 mL | ORAL | Status: DC
  Administered 2021-05-30 – 2021-06-03 (×5): 900 mL
  Filled 2021-05-30 (×4): qty 948

## 2021-05-30 MED ORDER — HYDRALAZINE HCL 25 MG PO TABS
25.0000 mg | ORAL_TABLET | Freq: Three times a day (TID) | ORAL | Status: DC
  Administered 2021-05-30 – 2021-06-01 (×4): 25 mg
  Filled 2021-05-30 (×5): qty 1

## 2021-05-30 MED ORDER — PROSOURCE TF PO LIQD
45.0000 mL | Freq: Three times a day (TID) | ORAL | Status: DC
  Administered 2021-05-30 – 2021-06-03 (×14): 45 mL
  Filled 2021-05-30 (×16): qty 45

## 2021-05-30 MED ORDER — IPRATROPIUM BROMIDE 0.02 % IN SOLN: 0.5000 mg | Freq: Four times a day (QID) | RESPIRATORY_TRACT | Status: DC | PRN

## 2021-05-30 MED ORDER — LEVALBUTEROL HCL 1.25 MG/0.5ML IN NEBU
1.2500 mg | INHALATION_SOLUTION | Freq: Four times a day (QID) | RESPIRATORY_TRACT | Status: DC | PRN
  Administered 2021-05-31: 09:00:00 1.25 mg via RESPIRATORY_TRACT
  Filled 2021-05-30 (×2): qty 0.5

## 2021-05-30 NOTE — Progress Notes (Signed)
[facility] PROGRESS NOTE   Ronald [name] XBW:[id number] DOB: May 19, 1946 DOA: 05/01/2021  PCP: Ronald Balloon, FNP  Brief History/Interval Summary: Mr. Ronald [name] is a 74 year old male with past medical history of COPD, Gold stage III-IV, tracheal stenosis s/p trach in 2018, CAD s/p DES x2 to LAD in 2015, hypertension, hyperlipidemia, chronic respiratory failure on 2 L, HFpEF, diabetes mellitus type 2 presented with shortness of breath.  He was started on steroids and DuoNebs by EMS, placed on CPAP without improvement.  Patient was intubated for airway protection.  Was initially admitted to intensive care unit. Patient had 2 episodes of bright red blood per rectum.  GI was consulted. They evaluated the patient, but felt that colonoscopy in the setting of the patient's active cardiopulmonary comorbidities presented risks due to the need for anesthesia that were best to be avoided.  Recommendation was for ongoing monitoring of hemoglobin and clinical status; if rebleed occurs, then to obtain CT angio abdomen/pelvis.   Patient now awaiting improvement of his nutritional intake in order for NG tube tube feeds to be stopped and SNF placement able to be pursued.   Consultants:  Critical care medicine Gastroenterology  Procedures: None reported    Subjective/Interval History: Patient without any significant complaints this morning.  No nausea vomiting.  He seems to be a little bit more distracted today compared to yesterday.  But he was fast asleep when I initially walked.     Assessment/Plan:  Lower GI bleed/acute blood loss anemia -Patient  had 2 large bloody bowel movements 05/16/21 -Apixaban has been held; discussed with family regarding risks/benefits; for now continue to hold - if rebleed for Hgb drop then stat CTA A/P -Colonoscopy earlier this year showed diverticulosis and internal hemorrhoids Was also seen by gastroenterology who recommended monitoring. No further  recurrence of bleeding has been noted.    Decreased level of consciousness Stable for the most part.  No focal neurological deficits.   Poor PO intake Patient remains on tube feedings.  Oral intake remains very poor.  Had only 25% of her meals in the last 48 hours.  Looks like he will need a PEG tube going forward.  Discussed with patient yesterday and he was agreeable.  Discussed with his son and daughter-in-law today who will talk to him again about this feeding tube but they do understand that this may be the only way forward.  Appetite stimulants will likely not work in this individual.  Consult interventional radiology to pursue PEG tube placement   Acute on chronic hypoxemic and hypercapnic respiratory failure -Secondary to COPD exacerbation; history of tracheal stenosis -Completed steroid taper -Continue BiPAP nightly -Continue bronchodilators, Pulmicort, Brovana, DuoNeb - encourage incentive spirometer use  Respiratory status is stable.  Right leg pain Patient complains of pain in the right thigh and hip area.  He underwent hip x-ray on 12/9 which did not show any injury.  Of the knee and femur were done on 12/21 which did not show any acute findings.     Acute kidney injury -Likely from hypotension, creatinine went up to 1.32.  -Resolved   Atrial flutter with RVR -Heart rate improved, continue amiodarone, Coreg -Eliquis held due to significant drop lower GI bleed.  Risks of holding anticoagulation was discussed with patient's family. Could rechallenge him with a lower dose of anticoagulation since now it has been 2 weeks since his bleeding episode. Will wait till PEG tube is placed.   Acute metabolic encephalopathy -Resolved -Continue Seroquel, Xanax as  needed   Diabetes mellitus type 2 -Last A1c 7.5% on 05/02/2021 -Patient on moderate sliding scale insulin with NovoLog -Dose of Lantus was decreased due to episodes of hypoglycemia.   CBGs are stable.    Dysphagia Patient is on DYS 2 diet with thin liquids.  Very poor oral intake is noted as discussed above.   Hypotension Has been in the hypertensive range for the past several days  Moderate protein calorie malnutrition Nutrition Problem: Moderate Malnutrition Etiology: acute illness (influenza A)  Signs/Symptoms: mild muscle depletion, mild fat depletion  Interventions: Tube feeding   DVT Prophylaxis: SCDs Code Status: Full code Family Communication: Discussed with patient.  Discussed with the son and daughter-in-law. Disposition Plan: Skilled nursing facility when medically stable  Status is: Inpatient  Remains inpatient appropriate because: Poor oral intake.  Requiring tube feedings      Medications: Scheduled:  amiodarone  100 mg Per Tube Daily   arformoterol  15 mcg Nebulization BID   aspirin  81 mg Per Tube Daily   atorvastatin  40 mg Per Tube QPM   budesonide (PULMICORT) nebulizer solution  0.5 mg Nebulization BID   carvedilol  37.5 mg Per Tube BID WC   Chlorhexidine Gluconate Cloth  6 each Topical Daily   docusate  100 mg Per Tube BID   escitalopram  20 mg Per Tube Daily   feeding supplement  237 mL Oral BID BM   feeding supplement (OSMOLITE 1.5 CAL)  1,000 mL Per Tube Q24H   feeding supplement (PROSource TF)  45 mL Per Tube QID   guaiFENesin  5 mL Per Tube Q6H   hydrALAZINE  25 mg Oral Q8H   insulin aspart  0-9 Units Subcutaneous TID WC   insulin glargine-yfgn  15 Units Subcutaneous BID   isosorbide dinitrate  10 mg Per Tube BID   mouth rinse  15 mL Mouth Rinse BID   melatonin  3 mg Per Tube QHS   nicotine  14 mg Transdermal Daily   pantoprazole sodium  40 mg Per Tube q1800   polyethylene glycol  17 g Per Tube Daily   QUEtiapine  100 mg Per Tube QHS   QUEtiapine  25 mg Per Tube Daily   Continuous:  sodium chloride Stopped (05/19/21 1600)   YOV:ZCHYIFOYDXAJO, ALPRAZolam, bisacodyl, hydrALAZINE, ipratropium, levalbuterol, ondansetron (ZOFRAN) IV,  QUEtiapine, sennosides  Antibiotics: Anti-infectives (From admission, onward)    Start     Dose/Rate Route Frequency Ordered Stop   05/06/21 1015  oseltamivir (TAMIFLU) capsule 75 mg        75 mg Per Tube 2 times daily 05/06/21 0917 05/06/21 0937   05/05/21 2100  azithromycin (ZITHROMAX) tablet 500 mg        500 mg Per Tube Every 24 hr x 2 05/04/21 2219 05/05/21 2134   05/04/21 2100  azithromycin (ZITHROMAX) tablet 500 mg  Status:  Discontinued        500 mg Oral Every 24 hr x 2 05/04/21 1110 05/04/21 2219   05/03/21 2200  oseltamivir (TAMIFLU) capsule 30 mg  Status:  Discontinued        30 mg Per Tube 2 times daily 05/03/21 1048 05/06/21 0917   05/03/21 1330  cefTRIAXone (ROCEPHIN) 2 g in sodium chloride 0.9 % 100 mL IVPB        2 g 200 mL/hr over 30 Minutes Intravenous Every 24 hours 05/03/21 1244 05/09/21 1504   05/03/21 1300  cefTRIAXone (ROCEPHIN) 1 g in sodium chloride 0.9 % 100 mL IVPB  Status:  Discontinued        1 g 200 mL/hr over 30 Minutes Intravenous Every 24 hours 05/03/21 1210 05/03/21 1244   05/01/21 2300  cefTRIAXone (ROCEPHIN) 1 g in sodium chloride 0.9 % 100 mL IVPB  Status:  Discontinued        1 g 200 mL/hr over 30 Minutes Intravenous Every 24 hours 05/01/21 1957 05/02/21 1102   05/01/21 2300  oseltamivir (TAMIFLU) capsule 75 mg  Status:  Discontinued        75 mg Per Tube 2 times daily 05/01/21 2221 05/03/21 1048   05/01/21 2200  ceFEPIme (MAXIPIME) 2 g in sodium chloride 0.9 % 100 mL IVPB  Status:  Discontinued        2 g 200 mL/hr over 30 Minutes Intravenous Every 8 hours 05/01/21 1346 05/01/21 1957   05/01/21 2200  oseltamivir (TAMIFLU) 6 MG/ML suspension 75 mg  Status:  Discontinued        75 mg Per Tube 2 times daily 05/01/21 1957 05/01/21 2221   05/01/21 2100  azithromycin (ZITHROMAX) 500 mg in sodium chloride 0.9 % 250 mL IVPB  Status:  Discontinued        500 mg 250 mL/hr over 60 Minutes Intravenous Every 24 hours 05/01/21 1957 05/04/21 1109   05/01/21  1400  doxycycline (VIBRAMYCIN) 100 mg in sodium chloride 0.9 % 250 mL IVPB  Status:  Discontinued        100 mg 125 mL/hr over 120 Minutes Intravenous Every 12 hours 05/01/21 1336 05/01/21 2018   05/01/21 1345  ceFEPIme (MAXIPIME) 2 g in sodium chloride 0.9 % 100 mL IVPB        2 g 200 mL/hr over 30 Minutes Intravenous  Once 05/01/21 1336 05/01/21 1804       Objective:  Vital Signs  Vitals:   05/30/21 0500 05/30/21 0754 05/30/21 0828 05/30/21 0931  BP:  (!) 150/52    Pulse:  97    Resp:  17 (!) 26 20  Temp:  97.6 F (36.4 C)    TempSrc:  Oral    SpO2:  100%    Weight: 83.4 kg       Intake/Output Summary (Last 24 hours) at 05/30/2021 1023 Last data filed at 05/30/2021 0757 Gross per 24 hour  Intake 100 ml  Output 601 ml  Net -501 ml    Filed Weights   05/18/21 0417 05/20/21 0441 05/30/21 0500  Weight: 88.6 kg 88.6 kg 83.4 kg    General appearance: Awake alert.  In no distress.  Distracted Resp: Clear to auscultation bilaterally.  Normal effort Cardio: S1-S2 is normal regular.  No S3-S4.  No rubs murmurs or bruit GI: Abdomen is soft.  Nontender nondistended.  Bowel sounds are present normal.  No masses organomegaly Extremities: No edema.  Restricted range of motion of the lower extremities bilaterally Neurologic:  No focal neurological deficits.      Lab Results:  Data Reviewed: I have personally reviewed following labs and imaging studies  CBC: Recent Labs  Lab 05/24/21 1019 05/25/21 0229 05/26/21 0146 05/27/21 0219 05/30/21 0341  WBC 7.5 6.8 6.5 5.7 5.5  NEUTROABS  --  4.8 4.6 3.8  --   HGB 8.1* 8.5* 8.0* 8.2* 9.1*  HCT 26.5* 28.5* 26.3* 27.7* 29.7*  MCV 97.1 96.3 96.3 98.6 95.8  PLT 368 414* 373 373 356     Basic Metabolic Panel: Recent Labs  Lab 05/24/21 1019 05/25/21 0229 05/26/21 0146 05/27/21 0219 05/30/21 0341  NA 137 138 139 138 133*  K 5.0 5.0 4.1 4.2 4.4  CL 105 103 103 103 100  CO2 28 31 31  32 29  GLUCOSE 218* 241* 117* 193*  203*  BUN 27* 26* 27* 29* 21  CREATININE 0.92 0.96 0.84 0.86 0.71  CALCIUM 8.0* 8.2* 7.9* 7.9* 7.7*     GFR: Estimated Creatinine Clearance: 82.2 mL/min (by C-G formula based on SCr of 0.71 mg/dL).  Liver Function Tests: Recent Labs  Lab 05/26/21 0146  AST 12*  ALT 15  ALKPHOS 62  BILITOT 1.2  PROT 5.2*  ALBUMIN 1.9*      CBG: Recent Labs  Lab 05/29/21 1612 05/29/21 1948 05/30/21 0242 05/30/21 0556 05/30/21 0754  GLUCAP 89 122* 174* 203* 146*      Radiology Studies: DG Knee Right Port  Result Date: 05/29/2021 CLINICAL DATA:  Knee pain EXAM: PORTABLE RIGHT KNEE - 1-2 VIEW COMPARISON:  None. FINDINGS: Two views of the right knee demonstrate regional atherosclerotic calcifications. There is marginal spurring of the patella potentially with some mild articular space loss in the patellofemoral joint. The knee is fully extended during imaging, I do not see a definite knee effusion although the extension may mildly reduced sensitivity for small effusions. No fracture or acute bony finding is observed. IMPRESSION: 1. Mild osteoarthritis of the knee.  No acute findings. Electronically Signed   By: Van Clines M.D.   On: 05/29/2021 14:53   DG FEMUR PORT, 1V RIGHT  Result Date: 05/29/2021 CLINICAL DATA:  Pain in the leg EXAM: RIGHT FEMUR PORTABLE 1 VIEW COMPARISON:  Knee radiographs from 05/29/2021 and hip radiographs from 05/17/2021 FINDINGS: Mild degenerative spurring of the right femoral head. Regional arterial atherosclerotic vascular calcifications noted. No fracture or acute bony findings on this frontal projection. IMPRESSION: 1. Frontal projection of the femur demonstrates no fracture or acute bony findings. 2. Atherosclerosis. 3. Mild degenerative spurring of the right femoral head. Electronically Signed   By: Van Clines M.D.   On: 05/29/2021 14:51       LOS: 29 days   Ladoris Lythgoe [employer] on [email or web address]  05/30/2021, 10:23  AM

## 2021-05-30 NOTE — TOC Progression Note (Signed)
Transition of Care [facility]) - Progression Note    Patient Details  Name: MINOR IDEN MRN: [id number] Date of Birth: [birth year]  Transition of Care [facility]) CM/SW Contact  Joanne Chars, [place] Phone Number: 05/30/2021, 3:22 PM  Clinical Narrative:   CSW spoke with pt and son Talib regarding SNF bed offer at [facility].  They want to accept this offer.  Pt is pending feeding tube.  CSW spoke with Ebony Hail at [facility].  They cannot accept anyone Saturday or Sunday, would need to be Monday.  Pt and son aware.    Expected Discharge Plan: [place] (vs SNF) Barriers to Discharge: Continued Medical Work up  Expected Discharge Plan and Services Expected Discharge Plan: [place] (vs SNF)   Discharge Planning Services: CM Consult   Living arrangements for the past 2 months: Single Family Home                                       Social Determinants of Health (SDOH) Interventions    Readmission Risk Interventions Readmission Risk Prevention Plan 09/28/2020  Transportation Screening Complete  HRI or Home Care Consult Complete  Social Work Consult for [place] Planning/Counseling Complete  Palliative Care Screening Not Applicable  Medication Review [employer]) Complete  Some recent data might be hidden

## 2021-05-30 NOTE — Progress Notes (Signed)
Nutrition Follow-up  DOCUMENTATION CODES:   Non-severe (moderate) malnutrition in context of acute illness/injury  INTERVENTION:   Recommend placement of PEG for long-term nutrition support given ongoing inadequate PO intake  Continue nocturnal TF via Cortrak: Osmolite 1.5 @ 39ml/h x 12 hours at night (6pm-6am), 900 ml total per night Prosource TF 45 ml TID   Provides 1470 kcal (67 % of estimated needs), 89 gm protein (74.5% of estimated needs), 685 ml free water daily.   Continue MVI with minerals daily via tube.   Continue Ensure Enlive po BID, each supplement provides 350 kcal and 20 grams of protein.  NUTRITION DIAGNOSIS:   Moderate Malnutrition related to acute illness (influenza A) as evidenced by mild muscle depletion, mild fat depletion.  ongoing  GOAL:   Patient will meet greater than or equal to 90% of their needs  Addressing with supplements and TF  MONITOR:   TF tolerance, Labs, Diet advancement  REASON FOR ASSESSMENT:   Consult Enteral/tube feeding initiation and management  ASSESSMENT:   75 yo male admitted from [facility] with acute respiratory failure, influenza A. PMH includes COPD, CAD, ulcerative colitis, HTN, asthma, DM-2, TIA, GERD, HLD.  Pt has continued to have poor PO intake since last RD assessment despite trying his best to eat meals PO. Pt understands he may need to have PEG placed if unable to improve intake and is agreeable to doing so if necessary. MD discussing with pt's family.   Pt remains on dysphagia 2 diet with thin liquids, but has continued to have inadequate PO intake with 0-25% meal completion x 5 recorded meals since last RD visit (11% avg meal intake). RN reports varied acceptance of Ensure supplements, but pt mostly tolerating. Given poor meal completion, pt has been receiving nocturnal TF via Cortrak (tip at the LOT) to meet his nutritional needs. Currently receiving: Osmolite 1.5 @ 50 x 12 hours with Prosource TF QID. Recommend  continue to provide nocturnal TF and placement of PEG for long-term nutrition support.   UOP: 632ml x24 hours I/O: +3781ml since admit  Current weight: 83.4 kg Admit weight: 85.4 kg   Medications: Scheduled Meds:  amiodarone  100 mg Per Tube Daily   arformoterol  15 mcg Nebulization BID   aspirin  81 mg Per Tube Daily   atorvastatin  40 mg Per Tube QPM   budesonide (PULMICORT) nebulizer solution  0.5 mg Nebulization BID   carvedilol  37.5 mg Per Tube BID WC   Chlorhexidine Gluconate Cloth  6 each Topical Daily   docusate  100 mg Per Tube BID   escitalopram  20 mg Per Tube Daily   feeding supplement  237 mL Oral BID BM   feeding supplement (OSMOLITE 1.5 CAL)  1,000 mL Per Tube Q24H   feeding supplement (PROSource TF)  45 mL Per Tube QID   guaiFENesin  5 mL Per Tube Q6H   hydrALAZINE  25 mg Per Tube Q8H   insulin aspart  0-9 Units Subcutaneous TID WC   insulin glargine-yfgn  15 Units Subcutaneous BID   isosorbide dinitrate  10 mg Per Tube BID   mouth rinse  15 mL Mouth Rinse BID   melatonin  3 mg Per Tube QHS   nicotine  14 mg Transdermal Daily   pantoprazole sodium  40 mg Per Tube q1800   polyethylene glycol  17 g Per Tube Daily   QUEtiapine  100 mg Per Tube QHS   QUEtiapine  25 mg Per Tube Daily  Continuous  Infusions:  sodium chloride Stopped (05/19/21 1600)    Labs: Recent Labs  Lab 05/26/21 0146 05/27/21 0219 05/30/21 0341  NA 139 138 133*  K 4.1 4.2 4.4  CL 103 103 100  CO2 31 32 29  BUN 27* 29* 21  CREATININE 0.84 0.86 0.71  CALCIUM 7.9* 7.9* 7.7*  GLUCOSE 117* 193* 203*  CBGs: 89-203 x24 hours   Diet Order:   Diet Order             DIET DYS 2 Room service appropriate? Yes; Fluid consistency: Thin  Diet effective now                   EDUCATION NEEDS:   No education needs have been identified at this time  Skin:  Skin Assessment: Skin Integrity Issues: Skin Integrity Issues:: Other (Comment) (skin tear scrotum)  Last BM:  12/21  Height:    Ht Readings from Last 1 Encounters:  04/27/21 5\' 9"  (1.753 m)    Weight:   Wt Readings from Last 1 Encounters:  05/30/21 83.4 kg     BMI:  Body mass index is 27.15 kg/m.  Estimated Nutritional Needs:   Kcal:  2200-2400  Protein:  120-130 gm  Fluid:  2.1-2.3 L     Barney Russomanno A., MS, RD, LDN (she/her/hers) RD pager number and weekend/on-call pager number located in [place].

## 2021-05-30 NOTE — Progress Notes (Signed)
OT Cancellation Note  Patient Details Name: Ronald [name] MRN: [id number] DOB: Jan 04, 1946   Cancelled Treatment:    Reason Eval/Treat Not Completed: Fatigue/lethargy limiting ability to participate;Other (comment) (pt had visitors to arrive as well). OT will follow up next available time  Britt Bottom 05/30/2021, 3:15 PM

## 2021-05-31 ENCOUNTER — Inpatient Hospital Stay (HOSPITAL_COMMUNITY): Payer: Medicare HMO

## 2021-05-31 ENCOUNTER — Encounter (HOSPITAL_COMMUNITY): Payer: Self-pay | Admitting: Pulmonary Disease

## 2021-05-31 DIAGNOSIS — J449 Chronic obstructive pulmonary disease, unspecified: Secondary | ICD-10-CM | POA: Diagnosis not present

## 2021-05-31 DIAGNOSIS — E440 Moderate protein-calorie malnutrition: Secondary | ICD-10-CM | POA: Diagnosis not present

## 2021-05-31 DIAGNOSIS — I4892 Unspecified atrial flutter: Secondary | ICD-10-CM | POA: Diagnosis not present

## 2021-05-31 DIAGNOSIS — M79604 Pain in right leg: Secondary | ICD-10-CM | POA: Diagnosis not present

## 2021-05-31 LAB — GLUCOSE, CAPILLARY
Glucose-Capillary: 194 mg/dL — ABNORMAL HIGH (ref 70–99)
Glucose-Capillary: 188 mg/dL — ABNORMAL HIGH (ref 70–99)
Glucose-Capillary: 116 mg/dL — ABNORMAL HIGH (ref 70–99)
Glucose-Capillary: 86 mg/dL (ref 70–99)
Glucose-Capillary: 133 mg/dL — ABNORMAL HIGH (ref 70–99)

## 2021-05-31 MED ORDER — IOHEXOL 300 MG/ML  SOLN
75.0000 mL | Freq: Once | INTRAMUSCULAR | Status: DC
  Filled 2021-05-31: qty 75

## 2021-05-31 MED ORDER — CEFAZOLIN SODIUM-DEXTROSE 2-4 GM/100ML-% IV SOLN: 2.0000 g | INTRAVENOUS | Status: AC

## 2021-05-31 MED ORDER — FUROSEMIDE 10 MG/ML IJ SOLN
40.0000 mg | Freq: Once | INTRAMUSCULAR | Status: AC
  Administered 2021-05-31: 13:00:00 40 mg via INTRAVENOUS
  Filled 2021-05-31: qty 4

## 2021-05-31 NOTE — Progress Notes (Signed)
OT Cancellation Note  Patient Details Name: Ronald [name] MRN: [id number] DOB: [birth year]   Cancelled Treatment:    Reason Eval/Treat Not Completed: Fatigue/lethargy limiting ability to participate. Pt sleeping upon OT entering room but opened eyes with verbal and tactile stimulation, however pt mumbling, "I'm sleepy, come back later". OT will follow up next available time  Britt Bottom 05/31/2021, 1:17 PM

## 2021-05-31 NOTE — H&P (Addendum)
Chief Complaint: Patient was seen in consultation today for gastrostomy tube placement at the request of Dr. Bonnielee Haff Referring Physician(s): Dr. Bonnielee Haff  Supervising Physician: Mir, Sharen Heck  Patient Status: [name] - In-pt  History of Present Illness: Ronald [name] is a 75 y.o. male with PMH of COPD Gold stage III-IV, tracheal stenosis status post trach in 2018, CAD, HTN, HLD, respiratory failure on 2 L O2 and DM type II.  Patient presented to ED 05/01/2021 complaining of respiratory distress for which EMS started patient on CPAP.  Patient was intubated at that time due to not being amendable to BiPAP.  He was extubated on 05/03/2021.  Since his admission patient has had poor oral oral intake and was placed on tube feedings via core track.  Dr. [place] Pink has requested gastrostomy tube placement for nutritional purposes. Patient's apixaban has been held due to bloody bowel movements. Pt still taking 81 mg ASA daily.   Past Medical History:  Diagnosis Date   Anxiety    Asthma    Chronic lower back pain    COPD (chronic obstructive pulmonary disease) (HCC)    Coronary artery disease    a. NSTEMI 05/2014 s/p DESx2 to LAD at [facility].   Depression    Educated about COVID-19 virus infection 03/06/2020   GERD (gastroesophageal reflux disease)    High cholesterol    Hypertension    NSTEMI (non-ST elevated myocardial infarction) ([place]) 05/2014   with stent placement   Sleep apnea    Stroke ([place]) 2017   anyeusym    TIA (transient ischemic attack)    "they say I've had some mini strokes; don't know when"; denies residual on 06/22/2014)   Type II diabetes mellitus ([place])    Ulcerative colitis ([place])     Past Surgical History:  Procedure Laterality Date   APPENDECTOMY     BIOPSY  07/20/2020   Procedure: BIOPSY;  Surgeon: Harvel Quale, MD;  [facility];  Service: Gastroenterology;;   CARDIAC CATHETERIZATION  [phone] 3   CHOLECYSTECTOMY     COLONOSCOPY  WITH PROPOFOL N/A 07/20/2020   Procedure: COLONOSCOPY WITH PROPOFOL;  Surgeon: Harvel Quale, MD;  Location[facility];  Service: Gastroenterology;  Laterality: N/A;  1:15   CORONARY ANGIOPLASTY WITH STENT PLACEMENT  05/2014   "2"   ESOPHAGEAL DILATION N/A 07/20/2020   Procedure: ESOPHAGEAL DILATION;  Surgeon: Harvel Quale, MD;  [facility]: [facility];  Service: Gastroenterology;  Laterality: N/A;   ESOPHAGOGASTRODUODENOSCOPY (EGD) WITH PROPOFOL N/A 07/20/2020   Procedure: ESOPHAGOGASTRODUODENOSCOPY (EGD) WITH PROPOFOL;  Surgeon: Harvel Quale, MD;  [facility];  Service: Gastroenterology;  Laterality: N/A;   LEFT HEART CATH AND CORONARY ANGIOGRAPHY N/A 05/25/2020   Procedure: LEFT HEART CATH AND CORONARY ANGIOGRAPHY;  Surgeon: Martinique, Peter M, MD;  Location: [place] [facility];  Service: Cardiovascular;  Laterality: N/A;   POLYPECTOMY  07/20/2020   Procedure: POLYPECTOMY INTESTINAL;  Surgeon: Montez Morita, Quillian Quince, MD;  Location: [facility];  Service: Gastroenterology;;   TUMOR EXCISION Right ~ 1999   "side of my upper head"    Allergies: Gabapentin and Metformin and related  Medications: Prior to Admission medications   Medication Sig Start Date End Date Taking? Authorizing Provider  albuterol (VENTOLIN HFA) 108 (90 Base) MCG/ACT inhaler INHALE 2 PUFFS EVERY 6 HOURS AS NEEEDED Patient taking differently: Inhale 2 puffs into the lungs every 6 (six) hours as needed for wheezing or shortness of breath. 11/02/19  Yes Hawks, Christy A, FNP  ALPRAZolam (XANAX) 0.5 MG tablet Take 1 tablet (0.5 mg total) by mouth 2 (two) times daily as needed. for anxiety 01/17/21  Yes Hawks, Christy A, FNP  amLODipine (NORVASC) 5 MG tablet Take 1 tablet (5 mg total) by mouth daily. 02/04/21  Yes Hawks, Christy A, FNP  Ascorbic Acid (VITAMIN C) 1000 MG tablet Take 1,000 mg by mouth daily.   Yes [provider]  atorvastatin (LIPITOR) 40 MG tablet  TAKE 1 TABLET BY MOUTH ONCE DAILY IN THE EVENING Patient taking differently: Take 40 mg by mouth every evening. 12/05/20  Yes Hawks, Christy A, FNP  cholecalciferol (VITAMIN D3) 25 MCG (1000 UNIT) tablet Take 1,000 Units by mouth daily.   Yes [provider]  escitalopram (LEXAPRO) 20 MG tablet Take 1 tablet (20 mg total) by mouth daily. 02/12/21  Yes Hawks, Christy A, FNP  fluticasone (FLONASE) 50 MCG/ACT nasal spray Place 2 sprays into both nostrils daily. Patient taking differently: Place 2 sprays into both nostrils daily as needed for allergies or rhinitis. 11/15/20  Yes Gwenlyn Perking, FNP  guaiFENesin (REFENESEN 400 PO) Take 2 tablets by mouth daily.   Yes [provider]  ibuprofen (ADVIL) 200 MG tablet Take 800 mg by mouth every 6 (six) hours as needed for mild pain.   Yes [provider]  ipratropium-albuterol (DUONEB) 0.5-2.5 (3) MG/3ML SOLN USE 1 AMPULE IN NEBULIZER 4 TIMES DAILY Patient taking differently: Take 3 mLs by nebulization 4 (four) times daily as needed (wheezing/shortness of breath). 04/29/21  Yes Hawks, Christy A, FNP  isosorbide mononitrate (IMDUR) 30 MG 24 hr tablet Take 1 tablet (30 mg total) by mouth daily. 04/09/21  Yes Branch, Alphonse Guild, MD  losartan (COZAAR) 100 MG tablet Take 1 tablet (100 mg total) by mouth daily. 02/05/21  Yes Hawks, Christy A, FNP  metoprolol tartrate (LOPRESSOR) 50 MG tablet Take 1 tablet (50 mg total) by mouth 2 (two) times daily. 03/04/21  Yes Hawks, Christy A, FNP  mirtazapine (REMERON) 15 MG tablet Take 1 tablet (15 mg total) by mouth at bedtime. 01/01/21  Yes Hawks, Christy A, FNP  nitroGLYCERIN (NITROSTAT) 0.4 MG SL tablet Place 0.4 mg under the tongue every 5 (five) minutes as needed for chest pain.   Yes [provider]  OXYGEN Inhale 3 L into the lungs at bedtime.   Yes [provider]  pantoprazole (PROTONIX) 40 MG tablet Take 1 tablet by mouth once daily Patient taking differently: 40 mg daily.  11/26/20  Yes Hawks, Christy A, FNP  predniSONE (DELTASONE) 10 MG tablet Take 2 tablets (20 mg total) by mouth daily. Patient taking differently: Take 20 mg by mouth daily. For 7 days 04/27/21  Yes Milton Ferguson, MD  Vibegron (GEMTESA) 75 MG TABS Take 75 mg by mouth daily. Patient taking differently: Take 75 mg by mouth at bedtime. 04/18/21  Yes Irine Seal, MD  aspirin EC 81 MG tablet Take 1 tablet (81 mg total) by mouth daily. Patient not taking: Reported on 05/03/2021 10/15/18   Herminio Commons, MD  Blood Glucose Monitoring Suppl (ACCU-CHEK GUIDE ME) w/Device KIT Check BS daily Dx E11.9 05/16/20   [name] A, FNP  furosemide (LASIX) 20 MG tablet Take 1 tablet (20 mg total) by mouth 2 (two) times daily. Patient not taking: Reported on 05/03/2021 01/01/21   [name] A, FNP  glucose blood (ONE TOUCH ULTRA TEST) test strip USE TO CHECK GLUCOSE ONCE DAILY 02/11/18   Ronnald Ramp,  Londell Moh, PA-C  Lancets (ONETOUCH ULTRASOFT) lancets Use as instructed   DX E11.9 05/05/17   [name], FNP     Family History  Problem Relation Age of Onset   CAD Father    Lung cancer Brother        smoked   Cancer Brother        lung   Leukemia Sister    Dementia Sister    Stroke Mother    Emphysema Sister    Cancer Brother        lung    Social History   Socioeconomic History   Marital status: Divorced    Spouse name: [name]   Number of children: 1   Years of education: Not on file   Highest education level: Not on file  Occupational History   Occupation: Retired    Comment: Aeronautical engineer  Tobacco Use   Smoking status: Every Day    Packs/day: 1.50    Years: 48.00    Pack years: 72.00    Types: Cigarettes    Start date: 02/12/1966   Smokeless tobacco: Never   Tobacco comments:    smokes 5-6 cigarettes per day 06/07/2020  Vaping Use   Vaping Use: Never used  Substance and Sexual Activity   Alcohol use: Not Currently    Alcohol/week: 0.0 standard drinks   Drug use: No   Sexual  activity: Never  Other Topics Concern   Not on file  Social History Narrative   Staying with his ex-wife, [name]   Has 2 children (today he told me one child 01/15/21)   Social Determinants of Health   Financial Resource Strain: Low Risk    Difficulty of Paying Living Expenses: Not very hard  Food Insecurity: No Food Insecurity   Worried About Charity fundraiser in the Last Year: Never true   [place] in the Last Year: Never true  Transportation Needs: No Transportation Needs   Lack of Transportation (Medical): No   Lack of Transportation (Non-Medical): No  Physical Activity: Inactive   Days of Exercise per Week: 0 days   Minutes of Exercise per Session: 0 min  Stress: No Stress Concern Present   Feeling of Stress : Only a little  Social Connections: Moderately Isolated   Frequency of Communication with Friends and Family: Twice a week   Frequency of Social Gatherings with Friends and Family: Once a week   Attends Religious Services: Never   Marine scientist or Organizations: No   Attends Music therapist: Never   Marital Status: Living with partner    Review of Systems: A 12 point ROS discussed and pertinent positives are indicated in the HPI above.  All other systems are negative.  Review of Systems  Constitutional:  Positive for appetite change.  Respiratory:  Negative for shortness of breath.   Cardiovascular:  Negative for chest pain.   Vital Signs: BP 118/60 (BP Location: Right Arm)    Pulse 98    Temp 98.3 F (36.8 C) (Oral)    Resp 18    Wt 184 lb 1.4 oz (83.5 kg)    SpO2 (!) 88%    BMI 27.18 kg/m   Physical Exam Constitutional:      Appearance: He is ill-appearing.     Comments: Pt lethargic. He responds to some questions but falls asleep quickly.   HENT:     Head: Normocephalic and atraumatic.     Nose:  Comments: Coretrack in place. Pt receiving tube feeds    Mouth/Throat:     Mouth: Mucous membranes are dry.     Pharynx:  Oropharynx is clear.     Comments: Tracheostomy stoma noted to be healed but still open from 2018. Cardiovascular:     Rate and Rhythm: Regular rhythm. Tachycardia present.     Pulses: Normal pulses.     Heart sounds: Normal heart sounds. No murmur heard.   No friction rub. No gallop.  Pulmonary:     Effort: Pulmonary effort is normal. No respiratory distress.     Comments: Lung sounds diminished to upper/lower fields Abdominal:     General: Bowel sounds are normal.     Tenderness: There is no abdominal tenderness. There is no guarding.  Genitourinary:    Comments: Foley catheter in place Musculoskeletal:        General: Swelling present.     Right lower leg: No edema.     Left lower leg: No edema.     Comments: Edema to bilateral upper extremities   Skin:    General: Skin is warm and dry.  Neurological:     Comments: Pt unable to answer questions d/t lethargy. He does follow some commands  Psychiatric:        Mood and Affect: Mood normal.        Behavior: Behavior normal.    Imaging: CT ABDOMEN WO CONTRAST  Result Date: 05/30/2021 CLINICAL DATA:  Evaluate anatomy prior to potential percutaneous gastrostomy tube placement. EXAM: CT ABDOMEN WITHOUT CONTRAST TECHNIQUE: Multidetector CT imaging of the abdomen was performed following the standard protocol without IV contrast. COMPARISON:  Chest CT-12/21/2020; 08/27/2018 FINDINGS: The lack of intravenous contrast limits the ability to evaluate solid abdominal organs. Lower chest: Limited visualization of the lower thorax demonstrates left basilar consolidative opacities and volume loss with associated air bronchograms. Trace bilateral pleural effusions. Borderline cardiomegaly. Trace amount of pericardial fluid, similar to the 08/27/2018 examination Hepatobiliary: Normal hepatic contour. Post cholecystectomy. No ascites. Pancreas: The pancreas is partially fatty replaced. Spleen: Normal noncontrast appearance of the spleen.  Adrenals/Urinary Tract: Subcentimeter hypoattenuating renal lesions are seen bilaterally. There is an approximately 1.7 cm apparent isoattenuating lesion involving the posteroinferior aspect the left kidney (image 33, series 3. There is an approximately 1.9 cm partially exophytic hypoattenuating lesion arising from the posterosuperior aspect the right kidney (image 24, series 3), which is incompletely characterized without intravenous contrast though favored to represent a renal cyst. Additional subcentimeter hypoattenuating lesions are too small to adequately characterize though also favored to represent additional renal cysts. Calcifications about the bilateral renal hila are favored to be vascular in etiology. No evidence of nephrolithiasis. There is a minimal amount of grossly symmetric likely age and body habitus related perinephric stranding. No urinary obstruction. Redemonstrated macroscopic fat containing approximately 1.6 cm right-sided adrenal myelolipoma as well as an approximately 0.9 cm nodule within the crux of the left adrenal gland (image 18, series 3), which is too small to accurately characterize though similar to the 08/27/2018 examination and also favored to represent an adrenal adenoma. The urinary bladder was not imaged. Stomach/Bowel: There is slight interposition of the hepatic parenchyma and transverse colon between the anterior wall of the stomach and ventral wall of the abdomen, however the percutaneous window is felt to likely be improved with gastric insufflation. Enteric tube tip terminates within the ascending portion of the duodenum. No evidence of enteric obstruction. No pneumoperitoneum, pneumatosis or portal venous gas. Vascular/Lymphatic: Large amount  of atherosclerotic plaque within normal caliber abdominal aorta. Post percutaneous coil embolization of the GDA. Other: Apparent asymmetry involving the superior most aspect of the right iliacus musculature (image 48, series 3),  incompletely imaged though potentially representative of a retroperitoneal hematoma. Musculoskeletal: No acute or aggressive osseous abnormalities. Mild multilevel lumbar spine DDD, worse at L2-L3 with disc space height loss, endplate irregularity and sclerosis. IMPRESSION: 1. Gastric anatomy felt to be amenable to attempted percutaneous gastrostomy tube placement with improved percutaneous window with gastric insufflation. 2. Apparent asymmetry involving the superior most aspect of the right iliacus musculature, incompletely imaged, though potentially representative of a retroperitoneal hematoma. Clinical correlation is advised. Further evaluation with completion noncontrast CT of the pelvis could be performed as indicated. 3.  Aortic Atherosclerosis (ICD10-I70.0). 4. Left lower lobe volume loss, consolidative opacities and air bronchograms, atelectasis versus infiltrate. 5. Suspected 1.7 cm isoattenuating left-sided renal lesion, potentially a hyperdense renal cyst though conceivably a renal cell carcinoma could have a similar appearance. Correlation with prior outside examinations (if available), is advised. Further evaluation with renal ultrasound could be performed as indicated. Electronically Signed   By: Sandi Mariscal M.D.   On: 05/30/2021 12:31   DG CHEST PORT 1 VIEW  Result Date: 05/31/2021 CLINICAL DATA:  Dyspnea EXAM: PORTABLE CHEST 1 VIEW COMPARISON:  05/22/2021 FINDINGS: Enteric tube remains present. Persistent patchy opacities, greater on the left. No significant pleural effusion. Right costophrenic angle is partially excluded. Stable cardiomediastinal contours. IMPRESSION: Persistent patchy opacities may reflect pneumonia. Electronically Signed   By: Macy Mis M.D.   On: 05/31/2021 10:06   DG CHEST PORT 1 VIEW  Result Date: 05/22/2021 CLINICAL DATA:  Short of breath, hypoxia EXAM: PORTABLE CHEST 1 VIEW COMPARISON:  05/16/2021 FINDINGS: Single frontal view of the chest demonstrates enteric  catheter passing below diaphragm tip excluded by collimation. The cardiac silhouette is stable. There is increasing consolidation at the left lung base. No effusion or pneumothorax. No acute bony abnormalities. IMPRESSION: 1. Progressive left basilar consolidation consistent with pneumonia or aspiration. Electronically Signed   By: Randa Ngo M.D.   On: 05/22/2021 20:09   DG CHEST PORT 1 VIEW  Result Date: 05/16/2021 CLINICAL DATA:  Respiratory distress EXAM: PORTABLE CHEST 1 VIEW COMPARISON:  11:08 a.m. FINDINGS: The lungs are symmetrically hyperinflated in keeping with changes of underlying COPD. Focal consolidations again seen within the retrocardiac region possibly reflecting lobar pneumonia or aspiration in the acute setting. Cardiac size is mildly enlarged, unchanged. Pulmonary vascularity is normal. No pneumothorax or pleural effusion. Nasoenteric feeding tube extends into the upper abdomen beyond the margin of the examination. IMPRESSION: Stable retrocardiac consolidation, possibly reflecting infection or aspiration in the appropriate clinical setting. COPD. Stable cardiomegaly Electronically Signed   By: Fidela Salisbury M.D.   On: 05/16/2021 04:07   DG CHEST PORT 1 VIEW  Result Date: 05/15/2021 CLINICAL DATA:  Shortness of breath EXAM: PORTABLE CHEST 1 VIEW COMPARISON:  Previous studies including the examination of 05/10/2021 FINDINGS: Transverse diameter of heart is increased. There are no signs of pulmonary edema. There is peribronchial thickening. There is increased density in the left lower lung fields. There is blunting of both lateral CP angles. There is no pneumothorax. Enteric tube is noted traversing the esophagus. Distal portion of enteric tube is not visualized. IMPRESSION: Cardiomegaly. There are no signs of pulmonary edema or focal pulmonary consolidation. Increased markings are seen in the left lower lung fields suggesting atelectasis/pneumonitis in the left lower lobe. There is  blunting of  both lateral CP angles suggesting small bilateral pleural effusions. Electronically Signed   By: Elmer Picker M.D.   On: 05/15/2021 13:03   DG CHEST PORT 1 VIEW  Result Date: 05/10/2021 CLINICAL DATA:  Hypoxia EXAM: PORTABLE CHEST 1 VIEW COMPARISON:  Portable exam 0835 hours compared to 05/08/2021 FINDINGS: Upper normal size of cardiac silhouette. Mediastinal contours and pulmonary vascularity normal. Atherosclerotic calcification aorta. Persistent increased markings at LEFT base question atelectasis or infiltrate. Peripheral infiltrate throughout mid LEFT lung, new. Remaining lungs clear. No pleural effusion or pneumothorax. IMPRESSION: New infiltrate peripherally in the mid to lower LEFT lung. Persistent atelectasis versus consolidation LEFT lung base. Aortic Atherosclerosis (ICD10-I70.0). Electronically Signed   By: Lavonia Dana M.D.   On: 05/10/2021 08:43   DG CHEST PORT 1 VIEW  Result Date: 05/08/2021 CLINICAL DATA:  Follow-up of COPD. EXAM: PORTABLE CHEST 1 VIEW COMPARISON:  05/07/2021 FINDINGS: Numerous leads and wires project over the chest. Nasogastric tube extends beyond the inferior aspect of the film. Endotracheal tube terminates 3.5 cm above carina. Midline trachea. Mild cardiomegaly. No pleural effusion or pneumothorax. Hyperinflation. Mild interstitial thickening. Especially when compared to the 05/05/2021 exam, suspicion of developing left lower lobe airspace disease. IMPRESSION: Suspicion of developing left lower lobe atelectasis versus infection. Underlying COPD/chronic bronchitis. Aortic Atherosclerosis (ICD10-I70.0). Electronically Signed   By: Abigail Miyamoto M.D.   On: 05/08/2021 07:55   DG CHEST PORT 1 VIEW  Result Date: 05/07/2021 CLINICAL DATA:  Mixed-type COPD, asthma, hypertension, prior NSTEMI and coronary stenting, diabetes mellitus EXAM: PORTABLE CHEST 1 VIEW COMPARISON:  Portable exam 1017 hours compared to 05/05/2021 FINDINGS: Nasogastric tube extends into  stomach. Tip of endotracheal tube projects 1.6 cm above carina. Normal heart size, mediastinal contours, and pulmonary vascularity. Atherosclerotic calcification aorta. Chronic peribronchial thickening, lung hyperexpansion, and accentuation of interstitial markings, stable consistent with COPD. No acute infiltrate, pleural effusion, or pneumothorax. Bones demineralized. IMPRESSION: COPD changes without acute abnormalities. Aortic Atherosclerosis (ICD10-I70.0) and Emphysema (ICD10-J43.9). Electronically Signed   By: Lavonia Dana M.D.   On: 05/07/2021 13:13   DG CHEST PORT 1 VIEW  Result Date: 05/05/2021 CLINICAL DATA:  Respiratory failure. EXAM: PORTABLE CHEST 1 VIEW COMPARISON:  05/03/2021 and older studies. FINDINGS: Endotracheal tube tip now projects 1.6 cm above the carinal. Nasal/orogastric tube passes well below the diaphragm, below the included field of view. Lungs are hyperexpanded. There are chronically prominent bronchovascular markings stable from the prior exams. No convincing pneumonia or pulmonary edema. No pleural effusion or pneumothorax. IMPRESSION: 1. Well-positioned support apparatus. 2. Lung hyperexpansion with chronically thickened bronchovascular/interstitial markings, findings consistent with COPD and unchanged from the prior exams. No convincing pneumonia or pulmonary edema. Electronically Signed   By: Lajean Manes M.D.   On: 05/05/2021 14:00   Portable Chest x-ray  Result Date: 05/03/2021 CLINICAL DATA:  Intubation. EXAM: PORTABLE CHEST 1 VIEW COMPARISON:  Chest radiograph dated 05/03/2021. FINDINGS: Endotracheal tube with tip approximately 2.5 cm above the carina. Recommend retraction by approximately 3 cm for optimal positioning. Enteric tube extends below the diaphragm with tip in the proximal stomach. Recommend further advancing by additional 5 cm for optimal positioning. Faint bilateral interstitial densities. There is slight blunting of the costophrenic angles which may  represent trace effusion. No pneumothorax. Stable cardiomediastinal silhouette. Atherosclerotic calcification of the aorta. No acute osseous pathology. IMPRESSION: 1. Endotracheal tube with tip above the carina. Recommend retraction by 3 cm for optimal positioning. 2. Enteric tube extends below the diaphragm with tip in the proximal stomach. Electronically  Signed   By: Anner Crete M.D.   On: 05/03/2021 23:41   DG CHEST PORT 1 VIEW  Result Date: 05/03/2021 CLINICAL DATA:  75 year old male with history of respiratory distress. EXAM: PORTABLE CHEST - 1 VIEW COMPARISON:  04/27/2021, 05/01/2021 FINDINGS: The mediastinal contours are within normal limits. No cardiomegaly. Atherosclerotic calcification of the aortic arch endotracheal tube in place with the distal tip of proximally 1 cm above the carina. Gastric decompression tube terminates off the inferior aspect of this image. Improved aeration of the left lung base. Similar appearing biapical hyperinflation. The lungs are clear bilaterally without evidence of focal consolidation, pleural effusion, or pneumothorax. No acute osseous abnormality. IMPRESSION: 1. Interval improved aeration of the left lung base. No new focal consolidations. 2. Endotracheal tube tip remains a proximally 1 cm above the carina. Electronically Signed   By: Ruthann Cancer M.D.   On: 05/03/2021 11:58   DG Chest Port 1 View  Result Date: 05/01/2021 CLINICAL DATA:  Shortness of breath, COPD EXAM: PORTABLE CHEST 1 VIEW COMPARISON:  04/27/2021 FINDINGS: Transverse diameter of heart is within normal limits. Low position of diaphragms suggests COPD. Increased interstitial markings are seen in the left lower lung fields. There are no signs of alveolar pulmonary edema. There is peribronchial thickening. There is blunting of left lateral CP angle. There is no pneumothorax. Tip of endotracheal tube is 1.8 cm above the carina and should be pulled back 2-3 cm. Enteric tube is noted traversing  the esophagus. IMPRESSION: Increased interstitial markings are seen in the left lower lung fields suggesting pneumonia. Possible small left pleural effusion. COPD. Tip of endotracheal tube is 1.8 cm above the carina and should be pulled back 2-3 cm. These results will be called to the ordering clinician or representative by the Radiologist Assistant, and communication documented in the PACS or [employer]. Electronically Signed   By: Elmer Picker M.D.   On: 05/01/2021 13:30   DG Knee Right Port  Result Date: 05/29/2021 CLINICAL DATA:  Knee pain EXAM: PORTABLE RIGHT KNEE - 1-2 VIEW COMPARISON:  None. FINDINGS: Two views of the right knee demonstrate regional atherosclerotic calcifications. There is marginal spurring of the patella potentially with some mild articular space loss in the patellofemoral joint. The knee is fully extended during imaging, I do not see a definite knee effusion although the extension may mildly reduced sensitivity for small effusions. No fracture or acute bony finding is observed. IMPRESSION: 1. Mild osteoarthritis of the knee.  No acute findings. Electronically Signed   By: Van Clines M.D.   On: 05/29/2021 14:53   DG Abd Portable 1V  Result Date: 05/10/2021 CLINICAL DATA:  Feeding tube placement. EXAM: PORTABLE ABDOMEN - 1 VIEW COMPARISON:  Same day. FINDINGS: Feeding tube tip is seen at the level of the ligament of Treitz. Contrast filling of small bowel is noted. IMPRESSION: Feeding tube tip seen at level of ligament of Treitz at the junction of duodenum and jejunum. Electronically Signed   By: Marijo Conception M.D.   On: 05/10/2021 13:47   DG Abd Portable 1V  Result Date: 05/10/2021 CLINICAL DATA:  Feeding tube placement EXAM: PORTABLE ABDOMEN - 1 VIEW COMPARISON:  None. FINDINGS: Feeding tube coiled in the stomach. No bowel dilatation to suggest obstruction. No evidence of pneumoperitoneum, portal venous gas or pneumatosis. No pathologic calcifications  along the expected course of the ureters. No acute osseous abnormality. IMPRESSION: Feeding tube coiled in the stomach. Electronically Signed   By: Kathreen Devoid  M.D.   On: 05/10/2021 12:10   DG Abd Portable 1V  Result Date: 05/03/2021 CLINICAL DATA:  NG tube placement. EXAM: PORTABLE ABDOMEN - 1 VIEW COMPARISON:  05/03/2021. FINDINGS: The visualized bowel gas pattern is within normal limits. No free air is identified. Enteric tube courses over the midline in terminates in the proximal stomach. The heart is stable in size and atherosclerotic calcification of the aorta is noted. There is a trace left pleural effusion with atelectasis at the left lung base. Surgical clips and metallic coils are noted in the right upper quadrant. IMPRESSION: 1. An enteric tube terminates in the proximal stomach. 2. Trace left pleural effusion with atelectasis at the left lung base. Electronically Signed   By: Brett Fairy M.D.   On: 05/03/2021 22:51   ECHOCARDIOGRAM COMPLETE  Result Date: 05/07/2021    ECHOCARDIOGRAM REPORT   Patient Name:   RASHAWD LASKARIS Date of Exam: 05/07/2021 Medical Rec #:  [id number]     Height:       69.0 in Accession #:    [id number]    Weight:       194.4 lb Date of Birth:  16-Mar-1946      BSA:          2.041 m Patient Age:    33 years      BP:           149/84 mmHg Patient Gender: M             HR:           100 bpm. Exam Location:  Inpatient Procedure: 2D Echo, Cardiac Doppler and Color Doppler Indications:    CHF  History:        Patient has prior history of Echocardiogram examinations, most                 recent 05/24/2020. CHF, CAD; Risk Factors:Former Smoker.  Sonographer:    Merrie Roof RDCS Referring Phys: [place]  1. Left ventricular ejection fraction, by estimation, is 50 to 55%. The left ventricle has low normal function. The left ventricle has no regional wall motion abnormalities. Left ventricular diastolic function could not be evaluated.  2. Right ventricular  systolic function is normal. The right ventricular size is normal. Tricuspid regurgitation signal is inadequate for assessing PA pressure.  3. The mitral valve is grossly normal. Trivial mitral valve regurgitation. No evidence of mitral stenosis.  4. The aortic valve is tricuspid. Aortic valve regurgitation is not visualized. No aortic stenosis is present. Comparison(s): Changes from prior study are noted. EF~50-55%. Atrial flutter now present. FINDINGS  Left Ventricle: Left ventricular ejection fraction, by estimation, is 50 to 55%. The left ventricle has low normal function. The left ventricle has no regional wall motion abnormalities. The left ventricular internal cavity size was normal in size. There is no left ventricular hypertrophy. Left ventricular diastolic function could not be evaluated due to atrial fibrillation. Left ventricular diastolic function could not be evaluated. Right Ventricle: The right ventricular size is normal. No increase in right ventricular wall thickness. Right ventricular systolic function is normal. Tricuspid regurgitation signal is inadequate for assessing PA pressure. Left Atrium: Left atrial size was normal in size. Right Atrium: Right atrial size was normal in size. Pericardium: There is no evidence of pericardial effusion. Presence of epicardial fat layer. Mitral Valve: The mitral valve is grossly normal. Trivial mitral valve regurgitation. No evidence of mitral valve stenosis. Tricuspid Valve: The tricuspid valve  is grossly normal. Tricuspid valve regurgitation is not demonstrated. No evidence of tricuspid stenosis. Aortic Valve: The aortic valve is tricuspid. Aortic valve regurgitation is not visualized. No aortic stenosis is present. Aortic valve mean gradient measures 3.0 mmHg. Aortic valve peak gradient measures 4.7 mmHg. Aortic valve area, by VTI measures 3.12 cm. Pulmonic Valve: The pulmonic valve was grossly normal. Pulmonic valve regurgitation is not visualized. No  evidence of pulmonic stenosis. Aorta: The aortic root is normal in size and structure. Venous: IVC assessment for right atrial pressure unable to be performed due to mechanical ventilation. IAS/Shunts: The atrial septum is grossly normal.  LEFT VENTRICLE PLAX 2D LVIDd:         4.50 cm LVIDs:         3.10 cm LV PW:         1.60 cm LV IVS:        1.10 cm LVOT diam:     2.30 cm LV SV:         55 LV SV Index:   27 LVOT Area:     4.15 cm  RIGHT VENTRICLE             IVC RV S prime:     11.10 cm/s  IVC diam: 2.20 cm TAPSE (M-mode): 1.9 cm LEFT ATRIUM             Index        RIGHT ATRIUM           Index LA diam:        4.00 cm 1.96 cm/m   RA Area:     17.00 cm LA Vol (A2C):   67.5 ml 33.07 ml/m  RA Volume:   49.80 ml  24.39 ml/m LA Vol (A4C):   52.6 ml 25.77 ml/m LA Biplane Vol: 60.1 ml 29.44 ml/m  AORTIC VALVE AV Area (Vmax):    3.25 cm AV Area (Vmean):   3.21 cm AV Area (VTI):     3.12 cm AV Vmax:           108.00 cm/s AV Vmean:          73.300 cm/s AV VTI:            0.177 m AV Peak Grad:      4.7 mmHg AV Mean Grad:      3.0 mmHg LVOT Vmax:         84.50 cm/s LVOT Vmean:        56.600 cm/s LVOT VTI:          0.133 m LVOT/AV VTI ratio: 0.75  AORTA Ao Root diam: 3.90 cm  SHUNTS Systemic VTI:  0.13 m Systemic Diam: 2.30 cm Eleonore Chiquito MD Electronically signed by Eleonore Chiquito MD Signature Date/Time: 05/07/2021/5:45:41 PM    Final    DG HIP PORT UNILAT WITH PELVIS 1V RIGHT  Result Date: 05/17/2021 CLINICAL DATA:  Hip pain. EXAM: DG HIP (WITH OR WITHOUT PELVIS) 1V PORT RIGHT COMPARISON:  Abdominal radiograph 05/10/2021 FINDINGS: No acute fracture or hip dislocation is identified. Right hip joint space width is preserved. A 2.5 cm dense focus in the right pelvis may reflect some residual oral contrast material from the prior study. Atherosclerotic vascular calcifications are noted. An enteric tube is partially visualized in the central abdomen. IMPRESSION: No acute osseous abnormality or significant  arthropathic changes identified about the right hip. Electronically Signed   By: Logan Bores M.D.   On: 05/17/2021 14:34   DG FEMUR PORT,  1V RIGHT  Result Date: 05/29/2021 CLINICAL DATA:  Pain in the leg EXAM: RIGHT FEMUR PORTABLE 1 VIEW COMPARISON:  Knee radiographs from 05/29/2021 and hip radiographs from 05/17/2021 FINDINGS: Mild degenerative spurring of the right femoral head. Regional arterial atherosclerotic vascular calcifications noted. No fracture or acute bony findings on this frontal projection. IMPRESSION: 1. Frontal projection of the femur demonstrates no fracture or acute bony findings. 2. Atherosclerosis. 3. Mild degenerative spurring of the right femoral head. Electronically Signed   By: Van Clines M.D.   On: 05/29/2021 14:51    Labs:  CBC: Recent Labs    05/25/21 0229 05/26/21 0146 05/27/21 0219 05/30/21 0341  WBC 6.8 6.5 5.7 5.5  HGB 8.5* 8.0* 8.2* 9.1*  HCT 28.5* 26.3* 27.7* 29.7*  PLT 414* 373 373 356    COAGS: Recent Labs    05/21/21 1117  INR 1.1    BMP: Recent Labs    06/14/20 1633 09/12/20 1525 05/25/21 0229 05/26/21 0146 05/27/21 0219 05/30/21 0341  NA 139   < > 138 139 138 133*  K 4.4   < > 5.0 4.1 4.2 4.4  CL 98   < > 103 103 103 100  CO2 31*   < > 31 31 32 29  GLUCOSE 103*   < > 241* 117* 193* 203*  BUN 17   < > 26* 27* 29* 21  CALCIUM 9.3   < > 8.2* 7.9* 7.9* 7.7*  CREATININE 0.95   < > 0.96 0.84 0.86 0.71  GFRNONAA 80   < > >60 >60 >60 >60  GFRAA 92  --   --   --   --   --    < > = values in this interval not displayed.    LIVER FUNCTION TESTS: Recent Labs    05/18/21 0219 05/19/21 0141 05/23/21 0052 05/26/21 0146  BILITOT 1.3* 1.6* 1.4* 1.2  AST 14* 15 12* 12*  ALT 21 19 16 15   ALKPHOS 57 54 68 62  PROT 4.8* 4.8* 5.3* 5.2*  ALBUMIN 2.2* 2.0* 2.0* 1.9*    TUMOR MARKERS: No results for input(s): AFPTM, CEA, CA199, CHROMGRNA in the last 8760 hours.  Assessment and Plan:  History of COPD Gold stage III-IV,  tracheal stenosis status post trach in 2018, CAD, HTN, HLD, respiratory failure on 2 L O2 and DM type II.  Patient presented to ED 05/01/2021 complaining of respiratory distress for which EMS started patient on CPAP.  Patient was intubated at that time due to not being amendable to BiPAP.  He was extubated on 05/03/2021.  Since his admission patient has had poor oral oral intake and was placed on tube feedings via core track.  Dr. [place] Pink has requested gastrostomy tube placement for nutritional purposes.  Patient's apixaban has been held due to bloody bowel movements. Pt still on 81 mg ASA daily.  INR 05/21/21 1.1   Procedure tentatively scheduled for 06/04/2021 with Dr. Dwaine Gale, IR.  Risks and benefits image guided gastrostomy tube placement was discussed with the patient's son including, but not limited to the need for a barium enema during the procedure, bleeding, infection, peritonitis and/or damage to adjacent structures.  All of the patient's sons questions were answered, patient and his son are agreeable to proceed.  Consent signed and in chart.   Thank you for this interesting consult.  I greatly enjoyed meeting Ronald [name] and look forward to participating in their care.  A copy of this report was sent to the  requesting provider on this date.  Electronically Signed: Tyson Alias, NP 05/31/2021, 11:45 AM   I spent a total of 30 minutes in face to face in clinical consultation, greater than 50% of which was counseling/coordinating care for gastrostomy tube placement.

## 2021-05-31 NOTE — Progress Notes (Signed)
[facility] PROGRESS NOTE   DANA DORNER KKX:[id number] DOB: 07-09-1945 DOA: 05/01/2021  PCP: [name], FNP  Brief History/Interval Summary: Mr. Flagg is a 75 year old male with past medical history of COPD, Gold stage III-IV, tracheal stenosis s/p trach in 2018, CAD s/p DES x2 to LAD in 2015, hypertension, hyperlipidemia, chronic respiratory failure on 2 L, HFpEF, diabetes mellitus type 2 presented with shortness of breath.  He was started on steroids and DuoNebs by EMS, placed on CPAP without improvement.  Patient was intubated for airway protection.  Was initially admitted to intensive care unit. Patient had 2 episodes of bright red blood per rectum.  GI was consulted. They evaluated the patient, but felt that colonoscopy in the setting of the patient's active cardiopulmonary comorbidities presented risks due to the need for anesthesia that were best to be avoided.   Consultants:  Critical care medicine Gastroenterology Interventional radiology  Procedures: None reported    Subjective/Interval History: Patient mentions that he is feeling a little short of breath this morning.  Appears to be little tachypneic.  Denies any chest pain.  No nausea vomiting overnight.       Assessment/Plan:  Lower GI bleed/acute blood loss anemia -Patient  had 2 large bloody bowel movements 05/16/21 -Apixaban has been held; discussed with family regarding risks/benefits; for now continue to hold -Colonoscopy earlier this year showed diverticulosis and internal hemorrhoids Was also seen by gastroenterology who recommended monitoring.  If he has recurrence of bleeding then CT angiogram was recommended.   No further recurrence of bleeding has been noted.    Decreased level of consciousness Stable for the most part.  No focal neurological deficits.   Poor PO intake Patient remains on tube feedings.  Oral intake remains very poor.  Had only 25% of her meals in the last 48 hours.   Looks like he will need a PEG tube going forward.  Discussed with patient and he was agreeable.  This was also discussed with patient's son and daughter-in-law.  They have discussed with patient and would like to proceed with plans for PEG tube. Interventional radiology has been consulted.  The earliest they can place a PEG tube is on December 27.    Acute on chronic hypoxemic and hypercapnic respiratory failure -Secondary to COPD exacerbation; history of tracheal stenosis -Completed steroid taper -Continue BiPAP nightly -Continue bronchodilators, Pulmicort, Brovana, DuoNeb - encourage incentive spirometer use  Respiratory status is stable. Noted to be dyspneic this morning.  Chest x-ray has been ordered.  Shows persistent opacities as seen previously.  He is noted to be afebrile.  There could be an element of volume overload.  We will give him a dose of Lasix.  He has previously completed course of antibiotics.  He is also on nebulizer treatments which will be continued.  Right leg pain Patient complains of pain in the right thigh and hip area.  He underwent hip x-ray on 12/9 which did not show any injury.  Of the knee and femur were done on 12/21 which did not show any acute findings.  CT scan of the abdomen pelvis done on 12/22 to evaluate anatomy before PEG tube placement did suggest concern for retroperitoneal hematoma based on the appearance of the right iliac [place] musculature.  This cleared to correspond to the bruising noted in his upper leg on the right.  Hemoglobin has been stable for the last several days.  He is hemodynamically stable.  This likely occurred several days ago.  We will continue to monitor clinically for now.  Left-sided renal lesion Incidentally noted on CT scan.  We will do renal ultrasound.   Acute kidney injury Creatinine went up to 1.32.  Resolved with IV fluids.   Atrial flutter with RVR -Noted to be tachycardic this morning.  Yet to receive his cardiac  medications.  Reevaluate later today.  Noted to be on amiodarone and carvedilol. -Eliquis held due to significant drop lower GI bleed.  Risks of holding anticoagulation was discussed with patient's family. Could rechallenge him with a lower dose of anticoagulation since now it has been 2 weeks since his bleeding episode. Will wait till PEG tube is placed.   Acute metabolic encephalopathy -Resolved -Continue Seroquel, Xanax as needed   Diabetes mellitus type 2 -Last A1c 7.5% on 05/02/2021 -Patient on moderate sliding scale insulin with NovoLog -Dose of Lantus was decreased due to episodes of hypoglycemia.   CBGs are stable.   Dysphagia Patient is on DYS 2 diet with thin liquids.  Very poor oral intake is noted as discussed above.  Plans for PEG tube placement on 12/27.   Hypotension Hypotension appears to have resolved.  Moderate protein calorie malnutrition Nutrition Problem: Moderate Malnutrition Etiology: acute illness (influenza A)  Signs/Symptoms: mild muscle depletion, mild fat depletion  Interventions: Tube feeding   DVT Prophylaxis: SCDs Code Status: Full code Family Communication: Discussed with patient.  Discussed with patient's son and daughter-in-law yesterday. Disposition Plan: Skilled nursing facility after PEG tube has been placed.  Status is: Inpatient  Remains inpatient appropriate because: Poor oral intake.  Requiring tube feedings      Medications: Scheduled:  amiodarone  100 mg Per Tube Daily   arformoterol  15 mcg Nebulization BID   aspirin  81 mg Per Tube Daily   atorvastatin  40 mg Per Tube QPM   budesonide (PULMICORT) nebulizer solution  0.5 mg Nebulization BID   carvedilol  37.5 mg Per Tube BID WC   Chlorhexidine Gluconate Cloth  6 each Topical Daily   docusate  100 mg Per Tube BID   escitalopram  20 mg Per Tube Daily   feeding supplement  237 mL Oral BID BM   feeding supplement (OSMOLITE 1.5 CAL)  900 mL Per Tube Q24H   feeding supplement  (PROSource TF)  45 mL Per Tube TID   guaiFENesin  5 mL Per Tube Q6H   hydrALAZINE  25 mg Per Tube Q8H   insulin aspart  0-9 Units Subcutaneous TID WC   insulin glargine-yfgn  15 Units Subcutaneous BID   isosorbide dinitrate  10 mg Per Tube BID   mouth rinse  15 mL Mouth Rinse BID   melatonin  3 mg Per Tube QHS   nicotine  14 mg Transdermal Daily   pantoprazole sodium  40 mg Per Tube q1800   polyethylene glycol  17 g Per Tube Daily   QUEtiapine  100 mg Per Tube QHS   QUEtiapine  25 mg Per Tube Daily   Continuous:  sodium chloride Stopped (05/19/21 1600)   WPY:[id number], ALPRAZolam, bisacodyl, hydrALAZINE, ipratropium, levalbuterol, ondansetron (ZOFRAN) IV, QUEtiapine, sennosides  Antibiotics: Anti-infectives (From admission, onward)    Start     Dose/Rate Route Frequency Ordered Stop   05/06/21 1015  oseltamivir (TAMIFLU) capsule 75 mg        75 mg Per Tube 2 times daily 05/06/21 0917 05/06/21 0937   05/05/21 2100  azithromycin (ZITHROMAX) tablet 500 mg        500 mg  Per Tube Every 24 hr x 2 05/04/21 2219 05/05/21 2134   05/04/21 2100  azithromycin (ZITHROMAX) tablet 500 mg  Status:  Discontinued        500 mg Oral Every 24 hr x 2 05/04/21 1110 05/04/21 2219   05/03/21 2200  oseltamivir (TAMIFLU) capsule 30 mg  Status:  Discontinued        30 mg Per Tube 2 times daily 05/03/21 1048 05/06/21 0917   05/03/21 1330  cefTRIAXone (ROCEPHIN) 2 g in sodium chloride 0.9 % 100 mL IVPB        2 g 200 mL/hr over 30 Minutes Intravenous Every 24 hours 05/03/21 1244 05/09/21 1504   05/03/21 1300  cefTRIAXone (ROCEPHIN) 1 g in sodium chloride 0.9 % 100 mL IVPB  Status:  Discontinued        1 g 200 mL/hr over 30 Minutes Intravenous Every 24 hours 05/03/21 1210 05/03/21 1244   05/01/21 2300  cefTRIAXone (ROCEPHIN) 1 g in sodium chloride 0.9 % 100 mL IVPB  Status:  Discontinued        1 g 200 mL/hr over 30 Minutes Intravenous Every 24 hours 05/01/21 1957 05/02/21 1102   05/01/21 2300   oseltamivir (TAMIFLU) capsule 75 mg  Status:  Discontinued        75 mg Per Tube 2 times daily 05/01/21 2221 05/03/21 1048   05/01/21 2200  ceFEPIme (MAXIPIME) 2 g in sodium chloride 0.9 % 100 mL IVPB  Status:  Discontinued        2 g 200 mL/hr over 30 Minutes Intravenous Every 8 hours 05/01/21 1346 05/01/21 1957   05/01/21 2200  oseltamivir (TAMIFLU) 6 MG/ML suspension 75 mg  Status:  Discontinued        75 mg Per Tube 2 times daily 05/01/21 1957 05/01/21 2221   05/01/21 2100  azithromycin (ZITHROMAX) 500 mg in sodium chloride 0.9 % 250 mL IVPB  Status:  Discontinued        500 mg 250 mL/hr over 60 Minutes Intravenous Every 24 hours 05/01/21 1957 05/04/21 1109   05/01/21 1400  doxycycline (VIBRAMYCIN) 100 mg in sodium chloride 0.9 % 250 mL IVPB  Status:  Discontinued        100 mg 125 mL/hr over 120 Minutes Intravenous Every 12 hours 05/01/21 1336 05/01/21 2018   05/01/21 1345  ceFEPIme (MAXIPIME) 2 g in sodium chloride 0.9 % 100 mL IVPB        2 g 200 mL/hr over 30 Minutes Intravenous  Once 05/01/21 1336 05/01/21 1804       Objective:  Vital Signs  Vitals:   05/31/21 0059 05/31/21 0500 05/31/21 0529 05/31/21 0758  BP: (!) 107/47  (!) 129/53 131/70  Pulse: 89  95 68  Resp: (!) 22  (!) 22 20  Temp: 98.2 F (36.8 C)  97.9 F (36.6 C) 98.6 F (37 C)  TempSrc: Axillary  Axillary Oral  SpO2: 96%  (!) 85% 91%  Weight:  83.5 kg      Intake/Output Summary (Last 24 hours) at 05/31/2021 1032 Last data filed at 05/30/2021 1554 Gross per 24 hour  Intake 240 ml  Output --  Net 240 ml    Filed Weights   05/20/21 0441 05/30/21 0500 05/31/21 0500  Weight: 88.6 kg 83.4 kg 83.5 kg    General appearance: Awake alert.  In no distress Resp: Noted to be mildly tachypneic.  Diminished air entry at the bases.  Few crackles at the left base.  No  wheezing or rhonchi. Cardio: S1-S2 is irregularly irregular.  Tachycardic.  No S3-S4.  No rubs or bruit. GI: Abdomen is soft.  Nontender  nondistended.  Bowel sounds are present normal.  No masses organomegaly Extremities: No edema.  Restricted range of motion of both lower extremities due to physical deconditioning Neurologic: No focal neurological deficits.     Lab Results:  Data Reviewed: I have personally reviewed following labs and imaging studies  CBC: Recent Labs  Lab 05/25/21 0229 05/26/21 0146 05/27/21 0219 05/30/21 0341  WBC 6.8 6.5 5.7 5.5  NEUTROABS 4.8 4.6 3.8  --   HGB 8.5* 8.0* 8.2* 9.1*  HCT 28.5* 26.3* 27.7* 29.7*  MCV 96.3 96.3 98.6 95.8  PLT 414* 373 373 356     Basic Metabolic Panel: Recent Labs  Lab 05/25/21 0229 05/26/21 0146 05/27/21 0219 05/30/21 0341  NA 138 139 138 133*  K 5.0 4.1 4.2 4.4  CL 103 103 103 100  CO2 31 31 32 29  GLUCOSE 241* 117* 193* 203*  BUN 26* 27* 29* 21  CREATININE 0.96 0.84 0.86 0.71  CALCIUM 8.2* 7.9* 7.9* 7.7*     GFR: Estimated Creatinine Clearance: 82.2 mL/min (by C-G formula based on SCr of 0.71 mg/dL).  Liver Function Tests: Recent Labs  Lab 05/26/21 0146  AST 12*  ALT 15  ALKPHOS 62  BILITOT 1.2  PROT 5.2*  ALBUMIN 1.9*      CBG: Recent Labs  Lab 05/30/21 0754 05/30/21 1622 05/30/21 2042 05/31/21 0101 05/31/21 0530  GLUCAP 146* 100* 176* 194* 188*      Radiology Studies: CT ABDOMEN WO CONTRAST  Result Date: 05/30/2021 CLINICAL DATA:  Evaluate anatomy prior to potential percutaneous gastrostomy tube placement. EXAM: CT ABDOMEN WITHOUT CONTRAST TECHNIQUE: Multidetector CT imaging of the abdomen was performed following the standard protocol without IV contrast. COMPARISON:  Chest CT-12/21/2020; 08/27/2018 FINDINGS: The lack of intravenous contrast limits the ability to evaluate solid abdominal organs. Lower chest: Limited visualization of the lower thorax demonstrates left basilar consolidative opacities and volume loss with associated air bronchograms. Trace bilateral pleural effusions. Borderline cardiomegaly. Trace amount  of pericardial fluid, similar to the 08/27/2018 examination Hepatobiliary: Normal hepatic contour. Post cholecystectomy. No ascites. Pancreas: The pancreas is partially fatty replaced. Spleen: Normal noncontrast appearance of the spleen. Adrenals/Urinary Tract: Subcentimeter hypoattenuating renal lesions are seen bilaterally. There is an approximately 1.7 cm apparent isoattenuating lesion involving the posteroinferior aspect the left kidney (image 33, series 3. There is an approximately 1.9 cm partially exophytic hypoattenuating lesion arising from the posterosuperior aspect the right kidney (image 24, series 3), which is incompletely characterized without intravenous contrast though favored to represent a renal cyst. Additional subcentimeter hypoattenuating lesions are too small to adequately characterize though also favored to represent additional renal cysts. Calcifications about the bilateral renal hila are favored to be vascular in etiology. No evidence of nephrolithiasis. There is a minimal amount of grossly symmetric likely age and body habitus related perinephric stranding. No urinary obstruction. Redemonstrated macroscopic fat containing approximately 1.6 cm right-sided adrenal myelolipoma as well as an approximately 0.9 cm nodule within the crux of the left adrenal gland (image 18, series 3), which is too small to accurately characterize though similar to the 08/27/2018 examination and also favored to represent an adrenal adenoma. The urinary bladder was not imaged. Stomach/Bowel: There is slight interposition of the hepatic parenchyma and transverse colon between the anterior wall of the stomach and ventral wall of the abdomen, however the percutaneous window is  felt to likely be improved with gastric insufflation. Enteric tube tip terminates within the ascending portion of the duodenum. No evidence of enteric obstruction. No pneumoperitoneum, pneumatosis or portal venous gas. Vascular/Lymphatic: Large  amount of atherosclerotic plaque within normal caliber abdominal aorta. Post percutaneous coil embolization of the GDA. Other: Apparent asymmetry involving the superior most aspect of the right iliacus musculature (image 48, series 3), incompletely imaged though potentially representative of a retroperitoneal hematoma. Musculoskeletal: No acute or aggressive osseous abnormalities. Mild multilevel lumbar spine DDD, worse at L2-L3 with disc space height loss, endplate irregularity and sclerosis. IMPRESSION: 1. Gastric anatomy felt to be amenable to attempted percutaneous gastrostomy tube placement with improved percutaneous window with gastric insufflation. 2. Apparent asymmetry involving the superior most aspect of the right iliacus musculature, incompletely imaged, though potentially representative of a retroperitoneal hematoma. Clinical correlation is advised. Further evaluation with completion noncontrast CT of the pelvis could be performed as indicated. 3.  Aortic Atherosclerosis (ICD10-I70.0). 4. Left lower lobe volume loss, consolidative opacities and air bronchograms, atelectasis versus infiltrate. 5. Suspected 1.7 cm isoattenuating left-sided renal lesion, potentially a hyperdense renal cyst though conceivably a renal cell carcinoma could have a similar appearance. Correlation with prior outside examinations (if available), is advised. Further evaluation with renal ultrasound could be performed as indicated. Electronically Signed   By: Sandi Mariscal M.D.   On: 05/30/2021 12:31   DG CHEST PORT 1 VIEW  Result Date: 05/31/2021 CLINICAL DATA:  Dyspnea EXAM: PORTABLE CHEST 1 VIEW COMPARISON:  05/22/2021 FINDINGS: Enteric tube remains present. Persistent patchy opacities, greater on the left. No significant pleural effusion. Right costophrenic angle is partially excluded. Stable cardiomediastinal contours. IMPRESSION: Persistent patchy opacities may reflect pneumonia. Electronically Signed   By: Macy Mis  M.D.   On: 05/31/2021 10:06   DG Knee Right Port  Result Date: 05/29/2021 CLINICAL DATA:  Knee pain EXAM: PORTABLE RIGHT KNEE - 1-2 VIEW COMPARISON:  None. FINDINGS: Two views of the right knee demonstrate regional atherosclerotic calcifications. There is marginal spurring of the patella potentially with some mild articular space loss in the patellofemoral joint. The knee is fully extended during imaging, I do not see a definite knee effusion although the extension may mildly reduced sensitivity for small effusions. No fracture or acute bony finding is observed. IMPRESSION: 1. Mild osteoarthritis of the knee.  No acute findings. Electronically Signed   By: Van Clines M.D.   On: 05/29/2021 14:53   DG FEMUR PORT, 1V RIGHT  Result Date: 05/29/2021 CLINICAL DATA:  Pain in the leg EXAM: RIGHT FEMUR PORTABLE 1 VIEW COMPARISON:  Knee radiographs from 05/29/2021 and hip radiographs from 05/17/2021 FINDINGS: Mild degenerative spurring of the right femoral head. Regional arterial atherosclerotic vascular calcifications noted. No fracture or acute bony findings on this frontal projection. IMPRESSION: 1. Frontal projection of the femur demonstrates no fracture or acute bony findings. 2. Atherosclerosis. 3. Mild degenerative spurring of the right femoral head. Electronically Signed   By: Van Clines M.D.   On: 05/29/2021 14:51       LOS: 30 days   Reginal Wojcicki [employer] on [email or web address]  05/31/2021, 10:32 AM

## 2021-05-31 NOTE — Progress Notes (Signed)
PT Cancellation Note  Patient Details Name: Ronald [name] MRN: [id number] DOB: 1946-02-07   Cancelled Treatment:    Reason Eval/Treat Not Completed: Fatigue/lethargy limiting ability to participate;Patient declined, no reason specified Pt saturating at 81% on RA upon arrival; placed 02 on and encouraged pursed lip breathing. Pt declines working with therapy reporting being too tired. Will follow.   Marguarite Arbour [name] 05/31/2021, 2:38 PM Marisa Severin, PT, DPT Acute Rehabilitation Services Pager [phone] Office [phone]

## 2021-06-01 ENCOUNTER — Inpatient Hospital Stay (HOSPITAL_COMMUNITY): Payer: Medicare HMO

## 2021-06-01 DIAGNOSIS — J449 Chronic obstructive pulmonary disease, unspecified: Secondary | ICD-10-CM | POA: Diagnosis not present

## 2021-06-01 DIAGNOSIS — M79604 Pain in right leg: Secondary | ICD-10-CM | POA: Diagnosis not present

## 2021-06-01 DIAGNOSIS — I4892 Unspecified atrial flutter: Secondary | ICD-10-CM | POA: Diagnosis not present

## 2021-06-01 DIAGNOSIS — E440 Moderate protein-calorie malnutrition: Secondary | ICD-10-CM | POA: Diagnosis not present

## 2021-06-01 LAB — CBC
HCT: 31 % — ABNORMAL LOW (ref 39.0–52.0)
Hemoglobin: 9.3 g/dL — ABNORMAL LOW (ref 13.0–17.0)
MCH: 28.9 pg (ref 26.0–34.0)
MCHC: 30 g/dL (ref 30.0–36.0)
MCV: 96.3 fL (ref 80.0–100.0)
Platelets: 359 10*3/uL (ref 150–400)
RBC: 3.22 MIL/uL — ABNORMAL LOW (ref 4.22–5.81)
RDW: 18.1 % — ABNORMAL HIGH (ref 11.5–15.5)
WBC: 5.6 10*3/uL (ref 4.0–10.5)
nRBC: 0 % (ref 0.0–0.2)

## 2021-06-01 LAB — BASIC METABOLIC PANEL
Anion gap: 6 (ref 5–15)
BUN: 23 mg/dL (ref 8–23)
CO2: 32 mmol/L (ref 22–32)
Calcium: 8.1 mg/dL — ABNORMAL LOW (ref 8.9–10.3)
Chloride: 97 mmol/L — ABNORMAL LOW (ref 98–111)
Creatinine, Ser: 0.89 mg/dL (ref 0.61–1.24)
GFR, Estimated: >60 mL/min (ref >60)
Glucose, Bld: 245 mg/dL — ABNORMAL HIGH (ref 70–99)
Potassium: 4.2 mmol/L (ref 3.5–5.1)
Sodium: 135 mmol/L (ref 135–145)

## 2021-06-01 LAB — GLUCOSE, CAPILLARY
Glucose-Capillary: 219 mg/dL — ABNORMAL HIGH (ref 70–99)
Glucose-Capillary: 153 mg/dL — ABNORMAL HIGH (ref 70–99)
Glucose-Capillary: 111 mg/dL — ABNORMAL HIGH (ref 70–99)
Glucose-Capillary: 107 mg/dL — ABNORMAL HIGH (ref 70–99)
Glucose-Capillary: 98 mg/dL (ref 70–99)
Glucose-Capillary: 80 mg/dL (ref 70–99)

## 2021-06-01 LAB — MAGNESIUM: Magnesium: 2 mg/dL (ref 1.7–2.4)

## 2021-06-01 LAB — PHOSPHORUS: Phosphorus: 4 mg/dL (ref 2.5–4.6)

## 2021-06-01 MED ORDER — FUROSEMIDE 10 MG/ML IJ SOLN
40.0000 mg | Freq: Once | INTRAMUSCULAR | Status: AC
  Administered 2021-06-01: 10:00:00 40 mg via INTRAVENOUS
  Filled 2021-06-01: qty 4

## 2021-06-01 NOTE — Plan of Care (Signed)

## 2021-06-01 NOTE — Progress Notes (Signed)
Pt refuses BIPAP for tonight.  

## 2021-06-01 NOTE — Plan of Care (Signed)
°  Problem: Activity: Goal: Risk for activity intolerance will decrease Outcome: Progressing   Problem: Nutrition: Goal: Adequate nutrition will be maintained Outcome: Progressing   Problem: Coping: Goal: Level of anxiety will decrease Outcome: Progressing   Problem: Elimination: Goal: Will not experience complications related to bowel motility Outcome: Progressing   Problem: Skin Integrity: Goal: Risk for impaired skin integrity will decrease Outcome: Progressing

## 2021-06-01 NOTE — Progress Notes (Signed)
[facility] PROGRESS NOTE   Ronald [name] FXT:[id number] DOB: 1945/10/27 DOA: 05/01/2021  PCP: [name], FNP  Brief History/Interval Summary: [name] is a 75 year old male with past medical history of COPD, Gold stage III-IV, tracheal stenosis s/p trach in 2018, CAD s/p DES x2 to LAD in 2015, hypertension, hyperlipidemia, chronic respiratory failure on 2 L, HFpEF, diabetes mellitus type 2 presented with shortness of breath.  He was started on steroids and DuoNebs by EMS, placed on CPAP without improvement.  Patient was intubated for airway protection.  Was initially admitted to intensive care unit. Patient had 2 episodes of bright red blood per rectum.  GI was consulted. They evaluated the patient, but felt that colonoscopy in the setting of the patient's active cardiopulmonary comorbidities presented risks due to the need for anesthesia that were best to be avoided.   Consultants:  Critical care medicine Gastroenterology Interventional radiology  Procedures: None reported    Subjective/Interval History: Patient denies any complaints this morning.  Mentions that shortness of breath has improved.  He is noted to be slightly tachypneic but not as much as yesterday.  Denies any chest pain.     Assessment/Plan:  Lower GI bleed/acute blood loss anemia -Patient  had 2 large bloody bowel movements 05/16/21 -Apixaban was held; discussed with family regarding risks/benefits -Colonoscopy earlier this year showed diverticulosis and internal hemorrhoids Was also seen by gastroenterology who recommended monitoring.  If he has recurrence of bleeding then CT angiogram was recommended.   No further recurrence of bleeding has been noted.    Decreased level of consciousness Stable for the most part.  No focal neurological deficits.   Poor PO intake Patient remains on tube feedings.  Oral intake remains very poor.   Looks like he will need a PEG tube going forward.  Discussed  with patient and he was agreeable.  This was also discussed with patient's son and daughter-in-law.  They have discussed with patient and would like to proceed with plans for PEG tube. Interventional radiology has been consulted.  The earliest they can place a PEG tube is on December 27.    Acute on chronic hypoxemic and hypercapnic respiratory failure -Secondary to COPD exacerbation; history of tracheal stenosis -Completed steroid taper -He is ordered BiPAP nightly though he is not being cooperative with same. -Continue bronchodilators, Pulmicort, Brovana, DuoNeb - encourage incentive spirometer use  Was noted to be dyspneic yesterday.  Chest x-ray showed opacities which have been seen previously.  He had previously completed a course of antibiotics. There was some concern for fluid overload.  Patient was given furosemide with good diuresis.  Looks like his symptoms have improved.  We will give him additional dose today. Continue nebulizer treatments as needed  Right leg pain Patient complains of pain in the right thigh and hip area.  He underwent hip x-ray on 12/9 which did not show any injury.  X-ray of the knee and femur were done on 12/21 which did not show any acute findings.   CT scan of the abdomen pelvis done on 12/22 to evaluate anatomy before PEG tube placement did suggest concern for retroperitoneal hematoma based on the appearance of the right iliac [place] musculature.  This seemed to correspond to the bruising noted in his posterior thigh on the right.  Hemoglobin has been stable for the last several days.  He is hemodynamically stable.  This likely occurred several days ago.  We will continue to monitor clinically for now. Seems to  be stable for the most part.  Left-sided renal lesion Incidentally noted on CT scan.  We will do renal ultrasound.   Acute kidney injury Creatinine had gone up to 1.32 on 05/17/21.  Resolved with IV fluids.   Atrial flutter with RVR Occasional elevation  in heart rate is noted.  He is on amiodarone and carvedilol which is being continued. -Eliquis held due to significant drop in hemoglobin from GI bleed.  Risks of holding anticoagulation was discussed with patient's family. Could rechallenge him with a lower dose of anticoagulation since now it has been 2 weeks since his bleeding episode. Will wait till PEG tube is placed.   Acute metabolic encephalopathy -Resolved -Continue Seroquel, Xanax as needed   Diabetes mellitus type 2 -Last A1c 7.5% on 05/02/2021 -Patient on moderate sliding scale insulin with NovoLog -Dose of Lantus was decreased due to episodes of hypoglycemia.   CBGs stable for the most part.   Dysphagia Patient is on DYS 2 diet with thin liquids.  Very poor oral intake is noted as discussed above.  Plans for PEG tube placement on 12/27.   Hypotension Hypotension appears to have resolved.  Moderate protein calorie malnutrition Nutrition Problem: Moderate Malnutrition Etiology: acute illness (influenza A)  Signs/Symptoms: mild muscle depletion, mild fat depletion  Interventions: Tube feeding   DVT Prophylaxis: SCDs Code Status: Full code Family Communication: Discussed with patient.  No family at bedside.  We will update family later today. Disposition Plan: Skilled nursing facility after PEG tube has been placed.  Status is: Inpatient  Remains inpatient appropriate because: Poor oral intake.  Requiring tube feedings      Medications: Scheduled:  amiodarone  100 mg Per Tube Daily   arformoterol  15 mcg Nebulization BID   aspirin  81 mg Per Tube Daily   atorvastatin  40 mg Per Tube QPM   budesonide (PULMICORT) nebulizer solution  0.5 mg Nebulization BID   carvedilol  37.5 mg Per Tube BID WC   Chlorhexidine Gluconate Cloth  6 each Topical Daily   docusate  100 mg Per Tube BID   escitalopram  20 mg Per Tube Daily   feeding supplement  237 mL Oral BID BM   feeding supplement (OSMOLITE 1.5 CAL)  900 mL Per  Tube Q24H   feeding supplement (PROSource TF)  45 mL Per Tube TID   guaiFENesin  5 mL Per Tube Q6H   hydrALAZINE  25 mg Per Tube Q8H   insulin aspart  0-9 Units Subcutaneous TID WC   insulin glargine-yfgn  15 Units Subcutaneous BID   [START ON 06/03/2021] iohexol  75 mL Per Tube Once   isosorbide dinitrate  10 mg Per Tube BID   mouth rinse  15 mL Mouth Rinse BID   melatonin  3 mg Per Tube QHS   nicotine  14 mg Transdermal Daily   pantoprazole sodium  40 mg Per Tube q1800   polyethylene glycol  17 g Per Tube Daily   QUEtiapine  100 mg Per Tube QHS   QUEtiapine  25 mg Per Tube Daily   Continuous:  sodium chloride Stopped (05/19/21 1600)   [START ON 06/04/2021]  ceFAZolin (ANCEF) IV     CZY:[id number], ALPRAZolam, bisacodyl, hydrALAZINE, ipratropium, levalbuterol, ondansetron (ZOFRAN) IV, QUEtiapine, sennosides  Antibiotics: Anti-infectives (From admission, onward)    Start     Dose/Rate Route Frequency Ordered Stop   06/04/21 1000  ceFAZolin (ANCEF) IVPB 2g/100 mL premix        2 g 200  mL/hr over 30 Minutes Intravenous To Radiology 05/31/21 1222 06/05/21 1000   05/06/21 1015  oseltamivir (TAMIFLU) capsule 75 mg        75 mg Per Tube 2 times daily 05/06/21 0917 05/06/21 0937   05/05/21 2100  azithromycin (ZITHROMAX) tablet 500 mg        500 mg Per Tube Every 24 hr x 2 05/04/21 2219 05/05/21 2134   05/04/21 2100  azithromycin (ZITHROMAX) tablet 500 mg  Status:  Discontinued        500 mg Oral Every 24 hr x 2 05/04/21 1110 05/04/21 2219   05/03/21 2200  oseltamivir (TAMIFLU) capsule 30 mg  Status:  Discontinued        30 mg Per Tube 2 times daily 05/03/21 1048 05/06/21 0917   05/03/21 1330  cefTRIAXone (ROCEPHIN) 2 g in sodium chloride 0.9 % 100 mL IVPB        2 g 200 mL/hr over 30 Minutes Intravenous Every 24 hours 05/03/21 1244 05/09/21 1504   05/03/21 1300  cefTRIAXone (ROCEPHIN) 1 g in sodium chloride 0.9 % 100 mL IVPB  Status:  Discontinued        1 g 200 mL/hr over 30  Minutes Intravenous Every 24 hours 05/03/21 1210 05/03/21 1244   05/01/21 2300  cefTRIAXone (ROCEPHIN) 1 g in sodium chloride 0.9 % 100 mL IVPB  Status:  Discontinued        1 g 200 mL/hr over 30 Minutes Intravenous Every 24 hours 05/01/21 1957 05/02/21 1102   05/01/21 2300  oseltamivir (TAMIFLU) capsule 75 mg  Status:  Discontinued        75 mg Per Tube 2 times daily 05/01/21 2221 05/03/21 1048   05/01/21 2200  ceFEPIme (MAXIPIME) 2 g in sodium chloride 0.9 % 100 mL IVPB  Status:  Discontinued        2 g 200 mL/hr over 30 Minutes Intravenous Every 8 hours 05/01/21 1346 05/01/21 1957   05/01/21 2200  oseltamivir (TAMIFLU) 6 MG/ML suspension 75 mg  Status:  Discontinued        75 mg Per Tube 2 times daily 05/01/21 1957 05/01/21 2221   05/01/21 2100  azithromycin (ZITHROMAX) 500 mg in sodium chloride 0.9 % 250 mL IVPB  Status:  Discontinued        500 mg 250 mL/hr over 60 Minutes Intravenous Every 24 hours 05/01/21 1957 05/04/21 1109   05/01/21 1400  doxycycline (VIBRAMYCIN) 100 mg in sodium chloride 0.9 % 250 mL IVPB  Status:  Discontinued        100 mg 125 mL/hr over 120 Minutes Intravenous Every 12 hours 05/01/21 1336 05/01/21 2018   05/01/21 1345  ceFEPIme (MAXIPIME) 2 g in sodium chloride 0.9 % 100 mL IVPB        2 g 200 mL/hr over 30 Minutes Intravenous  Once 05/01/21 1336 05/01/21 1804       Objective:  Vital Signs  Vitals:   06/01/21 0500 06/01/21 0809 06/01/21 0811 06/01/21 0911  BP:  (!) 152/69  (!) 138/52  Pulse:  89 91 100  Resp:  18 (!) 27 (!) 23  Temp:  98 F (36.7 C)  97.9 F (36.6 C)  TempSrc:  Oral  Oral  SpO2:  98% 98% 94%  Weight: 83.5 kg  83.5 kg     Intake/Output Summary (Last 24 hours) at 06/01/2021 0943 Last data filed at 05/31/2021 1641 Gross per 24 hour  Intake --  Output 2780 ml  Net -2780  ml    Filed Weights   05/31/21 0500 06/01/21 0500 06/01/21 0811  Weight: 83.5 kg 83.5 kg 83.5 kg    General appearance: Awake alert.  In no distress  mildly distracted Resp: Less tachypneic today compared to yesterday.  Diminished air entry at the bases with few crackles.  No wheezing or rhonchi appreciated. Cardio: S1-S2 is irregularly irregular GI: Abdomen is soft.  Nontender nondistended.  Bowel sounds are present normal.  No masses organomegaly Extremities: No edema.  Improve mobility of both lower extremity though still very deconditioned Neurologic:   No focal neurological deficits.      Lab Results:  Data Reviewed: I have personally reviewed following labs and imaging studies  CBC: Recent Labs  Lab 05/26/21 0146 05/27/21 0219 05/30/21 0341 06/01/21 0232  WBC 6.5 5.7 5.5 5.6  NEUTROABS 4.6 3.8  --   --   HGB 8.0* 8.2* 9.1* 9.3*  HCT 26.3* 27.7* 29.7* 31.0*  MCV 96.3 98.6 95.8 96.3  PLT 373 373 356 359     Basic Metabolic Panel: Recent Labs  Lab 05/26/21 0146 05/27/21 0219 05/30/21 0341 06/01/21 0232  NA 139 138 133* 135  K 4.1 4.2 4.4 4.2  CL 103 103 100 97*  CO2 31 32 29 32  GLUCOSE 117* 193* 203* 245*  BUN 27* 29* 21 23  CREATININE 0.84 0.86 0.71 0.89  CALCIUM 7.9* 7.9* 7.7* 8.1*  MG  --   --   --  2.0  PHOS  --   --   --  4.0     GFR: Estimated Creatinine Clearance: 73.9 mL/min (by C-G formula based on SCr of 0.89 mg/dL).  Liver Function Tests: Recent Labs  Lab 05/26/21 0146  AST 12*  ALT 15  ALKPHOS 62  BILITOT 1.2  PROT 5.2*  ALBUMIN 1.9*      CBG: Recent Labs  Lab 05/31/21 1143 05/31/21 1637 05/31/21 2030 06/01/21 0346 06/01/21 0810  GLUCAP 116* 86 133* 219* 153*      Radiology Studies: CT ABDOMEN WO CONTRAST  Result Date: 05/30/2021 CLINICAL DATA:  Evaluate anatomy prior to potential percutaneous gastrostomy tube placement. EXAM: CT ABDOMEN WITHOUT CONTRAST TECHNIQUE: Multidetector CT imaging of the abdomen was performed following the standard protocol without IV contrast. COMPARISON:  Chest CT-12/21/2020; 08/27/2018 FINDINGS: The lack of intravenous contrast limits  the ability to evaluate solid abdominal organs. Lower chest: Limited visualization of the lower thorax demonstrates left basilar consolidative opacities and volume loss with associated air bronchograms. Trace bilateral pleural effusions. Borderline cardiomegaly. Trace amount of pericardial fluid, similar to the 08/27/2018 examination Hepatobiliary: Normal hepatic contour. Post cholecystectomy. No ascites. Pancreas: The pancreas is partially fatty replaced. Spleen: Normal noncontrast appearance of the spleen. Adrenals/Urinary Tract: Subcentimeter hypoattenuating renal lesions are seen bilaterally. There is an approximately 1.7 cm apparent isoattenuating lesion involving the posteroinferior aspect the left kidney (image 33, series 3. There is an approximately 1.9 cm partially exophytic hypoattenuating lesion arising from the posterosuperior aspect the right kidney (image 24, series 3), which is incompletely characterized without intravenous contrast though favored to represent a renal cyst. Additional subcentimeter hypoattenuating lesions are too small to adequately characterize though also favored to represent additional renal cysts. Calcifications about the bilateral renal hila are favored to be vascular in etiology. No evidence of nephrolithiasis. There is a minimal amount of grossly symmetric likely age and body habitus related perinephric stranding. No urinary obstruction. Redemonstrated macroscopic fat containing approximately 1.6 cm right-sided adrenal myelolipoma as well as an approximately  0.9 cm nodule within the crux of the left adrenal gland (image 18, series 3), which is too small to accurately characterize though similar to the 08/27/2018 examination and also favored to represent an adrenal adenoma. The urinary bladder was not imaged. Stomach/Bowel: There is slight interposition of the hepatic parenchyma and transverse colon between the anterior wall of the stomach and ventral wall of the abdomen,  however the percutaneous window is felt to likely be improved with gastric insufflation. Enteric tube tip terminates within the ascending portion of the duodenum. No evidence of enteric obstruction. No pneumoperitoneum, pneumatosis or portal venous gas. Vascular/Lymphatic: Large amount of atherosclerotic plaque within normal caliber abdominal aorta. Post percutaneous coil embolization of the GDA. Other: Apparent asymmetry involving the superior most aspect of the right iliacus musculature (image 48, series 3), incompletely imaged though potentially representative of a retroperitoneal hematoma. Musculoskeletal: No acute or aggressive osseous abnormalities. Mild multilevel lumbar spine DDD, worse at L2-L3 with disc space height loss, endplate irregularity and sclerosis. IMPRESSION: 1. Gastric anatomy felt to be amenable to attempted percutaneous gastrostomy tube placement with improved percutaneous window with gastric insufflation. 2. Apparent asymmetry involving the superior most aspect of the right iliacus musculature, incompletely imaged, though potentially representative of a retroperitoneal hematoma. Clinical correlation is advised. Further evaluation with completion noncontrast CT of the pelvis could be performed as indicated. 3.  Aortic Atherosclerosis (ICD10-I70.0). 4. Left lower lobe volume loss, consolidative opacities and air bronchograms, atelectasis versus infiltrate. 5. Suspected 1.7 cm isoattenuating left-sided renal lesion, potentially a hyperdense renal cyst though conceivably a renal cell carcinoma could have a similar appearance. Correlation with prior outside examinations (if available), is advised. Further evaluation with renal ultrasound could be performed as indicated. Electronically Signed   By: Sandi Mariscal M.D.   On: 05/30/2021 12:31   DG CHEST PORT 1 VIEW  Result Date: 05/31/2021 CLINICAL DATA:  Dyspnea EXAM: PORTABLE CHEST 1 VIEW COMPARISON:  05/22/2021 FINDINGS: Enteric tube remains  present. Persistent patchy opacities, greater on the left. No significant pleural effusion. Right costophrenic angle is partially excluded. Stable cardiomediastinal contours. IMPRESSION: Persistent patchy opacities may reflect pneumonia. Electronically Signed   By: Macy Mis M.D.   On: 05/31/2021 10:06       LOS: 31 days   Lurlean Kernen [employer] on [email or web address]  06/01/2021, 9:43 AM

## 2021-06-02 DIAGNOSIS — E440 Moderate protein-calorie malnutrition: Secondary | ICD-10-CM | POA: Diagnosis not present

## 2021-06-02 DIAGNOSIS — J449 Chronic obstructive pulmonary disease, unspecified: Secondary | ICD-10-CM | POA: Diagnosis not present

## 2021-06-02 DIAGNOSIS — I4892 Unspecified atrial flutter: Secondary | ICD-10-CM | POA: Diagnosis not present

## 2021-06-02 DIAGNOSIS — J9602 Acute respiratory failure with hypercapnia: Secondary | ICD-10-CM | POA: Diagnosis not present

## 2021-06-02 LAB — GLUCOSE, CAPILLARY
Glucose-Capillary: 275 mg/dL — ABNORMAL HIGH (ref 70–99)
Glucose-Capillary: 101 mg/dL — ABNORMAL HIGH (ref 70–99)
Glucose-Capillary: 87 mg/dL (ref 70–99)
Glucose-Capillary: 204 mg/dL — ABNORMAL HIGH (ref 70–99)
Glucose-Capillary: 174 mg/dL — ABNORMAL HIGH (ref 70–99)

## 2021-06-02 NOTE — Plan of Care (Signed)
°  Problem: Education: Goal: Knowledge of General Education information will improve Description: Including pain rating scale, medication(s)/side effects and non-pharmacologic comfort measures Outcome: Progressing   Problem: Clinical Measurements: Goal: Ability to maintain clinical measurements within normal limits will improve Outcome: Progressing Goal: Will remain free from infection Outcome: Progressing Goal: Cardiovascular complication will be avoided Outcome: Progressing

## 2021-06-02 NOTE — Progress Notes (Signed)
[facility] PROGRESS NOTE   Ronald [name] CHY:[id number] DOB: Sep 10, 1945 DOA: 05/01/2021  PCP: [name], FNP  Brief History/Interval Summary: Ronald [name] is a 75 year old male with past medical history of COPD, Gold stage III-IV, tracheal stenosis s/p trach in 2018, CAD s/p DES x2 to LAD in 2015, hypertension, hyperlipidemia, chronic respiratory failure on 2 L, HFpEF, diabetes mellitus type 2 presented with shortness of breath.  He was started on steroids and DuoNebs by EMS, placed on CPAP without improvement.  Patient was intubated for airway protection.  Was initially admitted to intensive care unit. Patient had 2 episodes of bright red blood per rectum.  GI was consulted. They evaluated the patient, but felt that colonoscopy in the setting of the patient's active cardiopulmonary comorbidities presented risks due to the need for anesthesia that were best to be avoided.   Consultants:  Critical care medicine Gastroenterology Interventional radiology  Procedures: None reported    Subjective/Interval History: Patient mentions that he is feeling slightly better.  Less short of breath compared to yesterday but still feels very weak.  Denies any chest pain.     Assessment/Plan:  Lower GI bleed/acute blood loss anemia -Patient  had 2 large bloody bowel movements 05/16/21 -Apixaban was held; discussed with family regarding risks/benefits -Colonoscopy earlier this year showed diverticulosis and internal hemorrhoids Was also seen by gastroenterology who recommended monitoring and consider doing CT angiogram if he has recurrence of bleeding. No further recurrence of bleeding has been noted.    Poor PO intake Patient remains on tube feedings.  Oral intake remains very poor.   Looks like he will need a PEG tube going forward.  Discussed with patient and he was agreeable.  This was also discussed with patient's son and daughter-in-law.  They have discussed with patient and would  like to proceed with plans for PEG tube. Interventional radiology has been consulted.  The earliest they can place a PEG tube is on December 27.    Acute on chronic hypoxemic and hypercapnic respiratory failure -Secondary to COPD exacerbation; history of tracheal stenosis -Completed steroid taper -He is ordered BiPAP nightly though he is not being cooperative with same. -Continue bronchodilators, Pulmicort, Brovana, DuoNeb - encourage incentive spirometer use  There was some concern for fluid overload on 12/23.  Chest x-ray did not show any new changes.  Showed persistent opacities for which he has been treated with antibiotics previously.  He was given furosemide with good diuresis.  Symptoms appear to have improved.  He is not as tachypneic as he was 2 days ago.  Lasix dose was repeated on 12/24.  We will hold off on Lasix today since he is showing improvement.   Echocardiogram was done on November 29 and shows EF to be 50 to 55% without any wall motion abnormalities. Nebulizer treatments as needed. Noted to be saturating in mid 90s on 2 L of oxygen via nasal cannula.  Right leg pain Patient complains of pain in the right thigh and hip area.  He underwent hip x-ray on 12/9 which did not show any injury.  X-ray of the knee and femur were done on 12/21 which did not show any acute findings.   CT scan of the abdomen pelvis done on 12/22 to evaluate anatomy before PEG tube placement did suggest concern for retroperitoneal hematoma based on the appearance of the right iliac [place] musculature.  This seemed to correspond to the bruising noted in his posterior thigh on the right.  Hemoglobin  has been stable.  He has been hemodynamically stable.  This likely occurred several days ago.  Do not feel that he needs any further testing at this time.   We will continue to monitor clinically for now.  Atrial flutter with RVR Occasional elevation in heart rate is noted.  He is on amiodarone and carvedilol which is  being continued. -Eliquis held due to significant drop in hemoglobin from GI bleed.  Risks of holding anticoagulation was discussed with patient's family. Could rechallenge him with a lower dose of anticoagulation since now it has been 2 weeks since his bleeding episode. Will wait till PEG tube is placed.  Diabetes mellitus type 2 -Last A1c 7.5% on 05/02/2021 -Patient on moderate sliding scale insulin with NovoLog -Dose of Lantus was decreased due to episodes of hypoglycemia.   CBGs stable for the most part.  Occasional high readings noted.  Moderate protein calorie malnutrition Nutrition Problem: Moderate Malnutrition Etiology: acute illness (influenza A)  Signs/Symptoms: mild muscle depletion, mild fat depletion  Interventions: Tube feeding  Left-sided renal lesion Incidentally noted on CT scan.  Renal ultrasound only showed simple cyst.  No further work-up at this time.   Acute kidney injury Creatinine had gone up to 1.32 on 05/17/21.  Resolved with IV fluids.   Acute metabolic encephalopathy -Resolved -Continue Seroquel, Xanax as needed  Dysphagia Patient is on DYS 2 diet with thin liquids.  Very poor oral intake is noted as discussed above.  Plans for PEG tube placement on 12/27.   Hypotension Hypotension appears to have resolved.  DVT Prophylaxis: SCDs Code Status: Full code Family Communication: Discussed with patient.  No family at bedside.  We will update family later today. Disposition Plan: Skilled nursing facility after PEG tube has been placed.  Status is: Inpatient  Remains inpatient appropriate because: Poor oral intake.  Requiring tube feedings      Medications: Scheduled:  amiodarone  100 mg Per Tube Daily   arformoterol  15 mcg Nebulization BID   aspirin  81 mg Per Tube Daily   atorvastatin  40 mg Per Tube QPM   budesonide (PULMICORT) nebulizer solution  0.5 mg Nebulization BID   carvedilol  37.5 mg Per Tube BID WC   Chlorhexidine Gluconate Cloth   6 each Topical Daily   docusate  100 mg Per Tube BID   escitalopram  20 mg Per Tube Daily   feeding supplement  237 mL Oral BID BM   feeding supplement (OSMOLITE 1.5 CAL)  900 mL Per Tube Q24H   feeding supplement (PROSource TF)  45 mL Per Tube TID   guaiFENesin  5 mL Per Tube Q6H   insulin aspart  0-9 Units Subcutaneous TID WC   insulin glargine-yfgn  15 Units Subcutaneous BID   [START ON 06/03/2021] iohexol  75 mL Per Tube Once   isosorbide dinitrate  10 mg Per Tube BID   mouth rinse  15 mL Mouth Rinse BID   melatonin  3 mg Per Tube QHS   nicotine  14 mg Transdermal Daily   pantoprazole sodium  40 mg Per Tube q1800   polyethylene glycol  17 g Per Tube Daily   QUEtiapine  100 mg Per Tube QHS   QUEtiapine  25 mg Per Tube Daily   Continuous:  sodium chloride Stopped (06/01/21 2232)   [START ON 06/04/2021]  ceFAZolin (ANCEF) IV     TOI:[id number], ALPRAZolam, bisacodyl, hydrALAZINE, ipratropium, levalbuterol, ondansetron (ZOFRAN) IV, QUEtiapine, sennosides  Antibiotics: Anti-infectives (From admission, onward)  Start     Dose/Rate Route Frequency Ordered Stop   06/04/21 1000  ceFAZolin (ANCEF) IVPB 2g/100 mL premix        2 g 200 mL/hr over 30 Minutes Intravenous To Radiology 05/31/21 1222 06/05/21 1000   05/06/21 1015  oseltamivir (TAMIFLU) capsule 75 mg        75 mg Per Tube 2 times daily 05/06/21 0917 05/06/21 0937   05/05/21 2100  azithromycin (ZITHROMAX) tablet 500 mg        500 mg Per Tube Every 24 hr x 2 05/04/21 2219 05/05/21 2134   05/04/21 2100  azithromycin (ZITHROMAX) tablet 500 mg  Status:  Discontinued        500 mg Oral Every 24 hr x 2 05/04/21 1110 05/04/21 2219   05/03/21 2200  oseltamivir (TAMIFLU) capsule 30 mg  Status:  Discontinued        30 mg Per Tube 2 times daily 05/03/21 1048 05/06/21 0917   05/03/21 1330  cefTRIAXone (ROCEPHIN) 2 g in sodium chloride 0.9 % 100 mL IVPB        2 g 200 mL/hr over 30 Minutes Intravenous Every 24 hours 05/03/21 1244  05/09/21 1504   05/03/21 1300  cefTRIAXone (ROCEPHIN) 1 g in sodium chloride 0.9 % 100 mL IVPB  Status:  Discontinued        1 g 200 mL/hr over 30 Minutes Intravenous Every 24 hours 05/03/21 1210 05/03/21 1244   05/01/21 2300  cefTRIAXone (ROCEPHIN) 1 g in sodium chloride 0.9 % 100 mL IVPB  Status:  Discontinued        1 g 200 mL/hr over 30 Minutes Intravenous Every 24 hours 05/01/21 1957 05/02/21 1102   05/01/21 2300  oseltamivir (TAMIFLU) capsule 75 mg  Status:  Discontinued        75 mg Per Tube 2 times daily 05/01/21 2221 05/03/21 1048   05/01/21 2200  ceFEPIme (MAXIPIME) 2 g in sodium chloride 0.9 % 100 mL IVPB  Status:  Discontinued        2 g 200 mL/hr over 30 Minutes Intravenous Every 8 hours 05/01/21 1346 05/01/21 1957   05/01/21 2200  oseltamivir (TAMIFLU) 6 MG/ML suspension 75 mg  Status:  Discontinued        75 mg Per Tube 2 times daily 05/01/21 1957 05/01/21 2221   05/01/21 2100  azithromycin (ZITHROMAX) 500 mg in sodium chloride 0.9 % 250 mL IVPB  Status:  Discontinued        500 mg 250 mL/hr over 60 Minutes Intravenous Every 24 hours 05/01/21 1957 05/04/21 1109   05/01/21 1400  doxycycline (VIBRAMYCIN) 100 mg in sodium chloride 0.9 % 250 mL IVPB  Status:  Discontinued        100 mg 125 mL/hr over 120 Minutes Intravenous Every 12 hours 05/01/21 1336 05/01/21 2018   05/01/21 1345  ceFEPIme (MAXIPIME) 2 g in sodium chloride 0.9 % 100 mL IVPB        2 g 200 mL/hr over 30 Minutes Intravenous  Once 05/01/21 1336 05/01/21 1804       Objective:  Vital Signs  Vitals:   06/02/21 0600 06/02/21 0630 06/02/21 0822 06/02/21 0920  BP: (!) 127/25  (!) 155/45   Pulse: 82  85   Resp: 20  (!) 21   Temp:   97.6 F (36.4 C)   TempSrc:   Oral   SpO2: 97%  94% 95%  Weight:  83.1 kg      Intake/Output Summary (  Last 24 hours) at 06/02/2021 1021 Last data filed at 06/02/2021 0534 Gross per 24 hour  Intake 898.28 ml  Output 400 ml  Net 498.28 ml    Filed Weights   06/01/21  0500 06/01/21 0811 06/02/21 0630  Weight: 83.5 kg 83.5 kg 83.1 kg    General appearance: Awake alert.  In no distress Resp: Less tachypneic today compared to yesterday.  Diminished air entry at the bases.  Few crackles. Cardio: S1-S2 is irregularly irregular GI: Abdomen is soft.  Nontender nondistended.  Bowel sounds are present normal.  No masses organomegaly Extremities: No edema.   Neurologic: No focal neurological deficits.      Lab Results:  Data Reviewed: I have personally reviewed following labs and imaging studies  CBC: Recent Labs  Lab 05/27/21 0219 05/30/21 0341 06/01/21 0232  WBC 5.7 5.5 5.6  NEUTROABS 3.8  --   --   HGB 8.2* 9.1* 9.3*  HCT 27.7* 29.7* 31.0*  MCV 98.6 95.8 96.3  PLT 373 356 359     Basic Metabolic Panel: Recent Labs  Lab 05/27/21 0219 05/30/21 0341 06/01/21 0232  NA 138 133* 135  K 4.2 4.4 4.2  CL 103 100 97*  CO2 32 29 32  GLUCOSE 193* 203* 245*  BUN 29* 21 23  CREATININE 0.86 0.71 0.89  CALCIUM 7.9* 7.7* 8.1*  MG  --   --  2.0  PHOS  --   --  4.0     GFR: Estimated Creatinine Clearance: 73.9 mL/min (by C-G formula based on SCr of 0.89 mg/dL).   CBG: Recent Labs  Lab 06/01/21 1220 06/01/21 1256 06/01/21 1656 06/01/21 2004 06/02/21 0818  GLUCAP 111* 107* 98 80 275*      Radiology Studies: US RENAL  Result Date: 06/01/2021 CLINICAL DATA:  Indeterminate renal lesions by noncontrast CT EXAM: RENAL / URINARY TRACT ULTRASOUND COMPLETE COMPARISON:  05/30/2021 FINDINGS: Right Kidney: Renal measurements: 10.8 x 5.0 x 5.4 cm = volume: 150 mL. Normal echogenicity. No hydronephrosis. Scattered cortical anechoic simple appearing renal cysts, largest in the midpole measures 2.4 x 1.7 x 1.9 cm. Left Kidney: Renal measurements: 9.6 x 6.2 x 5.1 cm = volume: 158 mL. Normal echogenicity. No hydronephrosis. Anechoic cortical renal cysts also noted, largest in the mid to lower pole measures 1.3 x 1.2 x 1.5 cm. Additional upper pole cyst  medially measures 1.4 cm. Bladder: Appears normal for degree of bladder distention. Other: Enlarged prostate measuring 4.2 x 3.5 x 3.6 cm, 28 cc volume IMPRESSION: Bilateral simple appearing renal cortical cysts. No other acute finding or hydronephrosis. Prostate enlargement Electronically Signed   By: Jerilynn Mages.  Shick M.D.   On: 06/01/2021 11:34       LOS: 55 days   Angles Trevizo [employer] on [email or web address]  06/02/2021, 10:21 AM

## 2021-06-03 DIAGNOSIS — J9602 Acute respiratory failure with hypercapnia: Secondary | ICD-10-CM | POA: Diagnosis not present

## 2021-06-03 DIAGNOSIS — I4892 Unspecified atrial flutter: Secondary | ICD-10-CM | POA: Diagnosis not present

## 2021-06-03 DIAGNOSIS — J449 Chronic obstructive pulmonary disease, unspecified: Secondary | ICD-10-CM | POA: Diagnosis not present

## 2021-06-03 DIAGNOSIS — E440 Moderate protein-calorie malnutrition: Secondary | ICD-10-CM | POA: Diagnosis not present

## 2021-06-03 LAB — CBC
HCT: 34 % — ABNORMAL LOW (ref 39.0–52.0)
Hemoglobin: 10.3 g/dL — ABNORMAL LOW (ref 13.0–17.0)
MCH: 29.1 pg (ref 26.0–34.0)
MCHC: 30.3 g/dL (ref 30.0–36.0)
MCV: 96 fL (ref 80.0–100.0)
Platelets: 316 10*3/uL (ref 150–400)
RBC: 3.54 MIL/uL — ABNORMAL LOW (ref 4.22–5.81)
RDW: 17.5 % — ABNORMAL HIGH (ref 11.5–15.5)
WBC: 7.9 10*3/uL (ref 4.0–10.5)
nRBC: 0 % (ref 0.0–0.2)

## 2021-06-03 LAB — BASIC METABOLIC PANEL
Anion gap: 9 (ref 5–15)
BUN: 23 mg/dL (ref 8–23)
CO2: 29 mmol/L (ref 22–32)
Calcium: 8.2 mg/dL — ABNORMAL LOW (ref 8.9–10.3)
Chloride: 99 mmol/L (ref 98–111)
Creatinine, Ser: 0.79 mg/dL (ref 0.61–1.24)
GFR, Estimated: >60 mL/min (ref >60)
Glucose, Bld: 96 mg/dL (ref 70–99)
Potassium: 4.3 mmol/L (ref 3.5–5.1)
Sodium: 137 mmol/L (ref 135–145)

## 2021-06-03 LAB — GLUCOSE, CAPILLARY
Glucose-Capillary: 180 mg/dL — ABNORMAL HIGH (ref 70–99)
Glucose-Capillary: 267 mg/dL — ABNORMAL HIGH (ref 70–99)
Glucose-Capillary: 188 mg/dL — ABNORMAL HIGH (ref 70–99)
Glucose-Capillary: 211 mg/dL — ABNORMAL HIGH (ref 70–99)
Glucose-Capillary: 177 mg/dL — ABNORMAL HIGH (ref 70–99)

## 2021-06-03 LAB — MAGNESIUM: Magnesium: 2 mg/dL (ref 1.7–2.4)

## 2021-06-03 MED ORDER — METOPROLOL TARTRATE 5 MG/5ML IV SOLN: 2.5000 mg | Freq: Four times a day (QID) | INTRAVENOUS | Status: DC | PRN

## 2021-06-03 MED ORDER — FUROSEMIDE 20 MG PO TABS
20.0000 mg | ORAL_TABLET | Freq: Every day | ORAL | Status: DC
  Administered 2021-06-04 – 2021-06-06 (×3): 20 mg via ORAL
  Filled 2021-06-03 (×3): qty 1

## 2021-06-03 MED ORDER — FUROSEMIDE 10 MG/ML IJ SOLN
40.0000 mg | Freq: Once | INTRAMUSCULAR | Status: AC
  Administered 2021-06-03: 11:00:00 40 mg via INTRAVENOUS
  Filled 2021-06-03: qty 4

## 2021-06-03 NOTE — Progress Notes (Signed)
[facility] PROGRESS NOTE   Ronald [name]:[id number] DOB: Mar 16, 1946 DOA: 05/01/2021  PCP: [name], FNP  Brief History/Interval Summary: Ronald [name] is a 75 year old male with past medical history of COPD, Gold stage III-IV, tracheal stenosis s/p trach in 2018, CAD s/p DES x2 to LAD in 2015, hypertension, hyperlipidemia, chronic respiratory failure on 2 L, HFpEF, diabetes mellitus type 2 presented with shortness of breath.  He was started on steroids and DuoNebs by EMS, placed on CPAP without improvement.  Patient was intubated for airway protection.  Was initially admitted to intensive care unit. Patient had 2 episodes of bright red blood per rectum.  GI was consulted. They evaluated the patient, but felt that colonoscopy in the setting of the patient's active cardiopulmonary comorbidities presented risks due to the need for anesthesia that were best to be avoided.   Consultants:  Critical care medicine Gastroenterology Interventional radiology  Procedures: None reported    Subjective/Interval History: Patient mentions that he feels well.  Denies any chest pain.  Shortness of breath is improved.  No nausea vomiting.   Assessment/Plan:  Lower GI bleed/acute blood loss anemia -Patient had 2 large bloody bowel movements 05/16/21 -Apixaban was held; discussed with family regarding risks/benefits -Colonoscopy earlier this year showed diverticulosis and internal hemorrhoids Was also seen by gastroenterology who recommended monitoring and consider doing CT angiogram if he has recurrence of bleeding. No further recurrence of bleeding has been noted.    Poor PO intake Patient remains on tube feedings.  Oral intake remains very poor.   Looks like he will need a PEG tube going forward.  Discussed with patient and he was agreeable.  This was also discussed with patient's son and daughter-in-law.  They have discussed with patient and would like to proceed with plans for PEG  tube. Interventional radiology has been consulted.  The earliest they can place a PEG tube is on December 27.    Acute on chronic hypoxemic and hypercapnic respiratory failure -Secondary to COPD exacerbation; history of tracheal stenosis -Completed steroid taper -He is ordered BiPAP nightly though he is not being cooperative with same. -Continue bronchodilators, Pulmicort, Brovana, DuoNeb - encourage incentive spirometer use  There was some concern for fluid overload on 12/23.  Chest x-ray did not show any new changes.  Showed persistent opacities for which he has been treated with antibiotics previously.  He was given furosemide with good diuresis.   Symptoms have improved.  Will give additional dose of furosemide today.  Start him on scheduled furosemide from tomorrow.     Echocardiogram was done on November 29 and shows EF to be 50 to 55% without any wall motion abnormalities. Nebulizer treatments as needed. Noted to be saturating in mid 90s on 2 L of oxygen via nasal cannula.  Right leg pain Patient complains of pain in the right thigh and hip area.  He underwent hip x-ray on 12/9 which did not show any injury.  X-ray of the knee and femur were done on 12/21 which did not show any acute findings.   CT scan of the abdomen done on 12/22 to evaluate anatomy before PEG tube placement did suggest concern for retroperitoneal hematoma based on the appearance of the right iliacus musculature.  This seemed to correspond to the bruising noted in his posterior thigh on the right.  Could have had a muscular strain.  Hemoglobin has been stable.  He has been hemodynamically stable.  This likely occurred several days ago.  Do  not feel that he needs any further testing at this time.   We will continue to monitor clinically for now.  Atrial flutter with RVR Occasional elevation in heart rate is noted.  He is on amiodarone and carvedilol which is being continued. -Eliquis held due to significant drop in  hemoglobin from GI bleed.  Risks of holding anticoagulation was discussed with patient's family. Could rechallenge him with a lower dose of anticoagulation since now it has been 2 weeks since his bleeding episode. Will wait till PEG tube is placed.  Diabetes mellitus type 2 -Last A1c 7.5% on 05/02/2021 -Patient on moderate sliding scale insulin with NovoLog -Dose of Lantus was decreased due to episodes of hypoglycemia.   CBGs stable for the most part.  Occasional high readings noted.  Left-sided renal lesion Incidentally noted on CT scan.  Renal ultrasound only showed simple cyst.  No further work-up at this time.   Acute kidney injury Creatinine had gone up to 1.32 on 05/17/21.  Resolved with IV fluids.   Acute metabolic encephalopathy -Resolved -Continue Seroquel, Xanax as needed  Dysphagia Patient is on DYS 2 diet with thin liquids.  Very poor oral intake is noted as discussed above.  Plans for PEG tube placement on 12/27.   Hypotension Hypotension appears to have resolved.  Moderate protein calorie malnutrition Nutrition Problem: Moderate Malnutrition Etiology: acute illness (influenza A)  Signs/Symptoms: mild muscle depletion, mild fat depletion   DVT Prophylaxis: SCDs Code Status: Full code Family Communication: Discussed with patient.  Family was updated yesterday. Disposition Plan: Skilled nursing facility after PEG tube has been placed.  Status is: Inpatient  Remains inpatient appropriate because: Poor oral intake.  Requiring tube feedings      Medications: Scheduled:  amiodarone  100 mg Per Tube Daily   arformoterol  15 mcg Nebulization BID   aspirin  81 mg Per Tube Daily   atorvastatin  40 mg Per Tube QPM   budesonide (PULMICORT) nebulizer solution  0.5 mg Nebulization BID   carvedilol  37.5 mg Per Tube BID WC   Chlorhexidine Gluconate Cloth  6 each Topical Daily   docusate  100 mg Per Tube BID   escitalopram  20 mg Per Tube Daily   feeding supplement   237 mL Oral BID BM   feeding supplement (OSMOLITE 1.5 CAL)  900 mL Per Tube Q24H   feeding supplement (PROSource TF)  45 mL Per Tube TID   [START ON 06/04/2021] furosemide  20 mg Oral Daily   guaiFENesin  5 mL Per Tube Q6H   insulin aspart  0-9 Units Subcutaneous TID WC   insulin glargine-yfgn  15 Units Subcutaneous BID   iohexol  75 mL Per Tube Once   isosorbide dinitrate  10 mg Per Tube BID   mouth rinse  15 mL Mouth Rinse BID   melatonin  3 mg Per Tube QHS   nicotine  14 mg Transdermal Daily   pantoprazole sodium  40 mg Per Tube q1800   polyethylene glycol  17 g Per Tube Daily   QUEtiapine  100 mg Per Tube QHS   QUEtiapine  25 mg Per Tube Daily   Continuous:  sodium chloride Stopped (06/01/21 2232)   [START ON 06/04/2021]  ceFAZolin (ANCEF) IV     DJS:[id number], ALPRAZolam, bisacodyl, hydrALAZINE, ipratropium, levalbuterol, metoprolol tartrate, ondansetron (ZOFRAN) IV, QUEtiapine, sennosides  Antibiotics: Anti-infectives (From admission, onward)    Start     Dose/Rate Route Frequency Ordered Stop   06/04/21 1000  ceFAZolin (  ANCEF) IVPB 2g/100 mL premix        2 g 200 mL/hr over 30 Minutes Intravenous To Radiology 05/31/21 1222 06/05/21 1000   05/06/21 1015  oseltamivir (TAMIFLU) capsule 75 mg        75 mg Per Tube 2 times daily 05/06/21 0917 05/06/21 0937   05/05/21 2100  azithromycin (ZITHROMAX) tablet 500 mg        500 mg Per Tube Every 24 hr x 2 05/04/21 2219 05/05/21 2134   05/04/21 2100  azithromycin (ZITHROMAX) tablet 500 mg  Status:  Discontinued        500 mg Oral Every 24 hr x 2 05/04/21 1110 05/04/21 2219   05/03/21 2200  oseltamivir (TAMIFLU) capsule 30 mg  Status:  Discontinued        30 mg Per Tube 2 times daily 05/03/21 1048 05/06/21 0917   05/03/21 1330  cefTRIAXone (ROCEPHIN) 2 g in sodium chloride 0.9 % 100 mL IVPB        2 g 200 mL/hr over 30 Minutes Intravenous Every 24 hours 05/03/21 1244 05/09/21 1504   05/03/21 1300  cefTRIAXone (ROCEPHIN) 1 g in  sodium chloride 0.9 % 100 mL IVPB  Status:  Discontinued        1 g 200 mL/hr over 30 Minutes Intravenous Every 24 hours 05/03/21 1210 05/03/21 1244   05/01/21 2300  cefTRIAXone (ROCEPHIN) 1 g in sodium chloride 0.9 % 100 mL IVPB  Status:  Discontinued        1 g 200 mL/hr over 30 Minutes Intravenous Every 24 hours 05/01/21 1957 05/02/21 1102   05/01/21 2300  oseltamivir (TAMIFLU) capsule 75 mg  Status:  Discontinued        75 mg Per Tube 2 times daily 05/01/21 2221 05/03/21 1048   05/01/21 2200  ceFEPIme (MAXIPIME) 2 g in sodium chloride 0.9 % 100 mL IVPB  Status:  Discontinued        2 g 200 mL/hr over 30 Minutes Intravenous Every 8 hours 05/01/21 1346 05/01/21 1957   05/01/21 2200  oseltamivir (TAMIFLU) 6 MG/ML suspension 75 mg  Status:  Discontinued        75 mg Per Tube 2 times daily 05/01/21 1957 05/01/21 2221   05/01/21 2100  azithromycin (ZITHROMAX) 500 mg in sodium chloride 0.9 % 250 mL IVPB  Status:  Discontinued        500 mg 250 mL/hr over 60 Minutes Intravenous Every 24 hours 05/01/21 1957 05/04/21 1109   05/01/21 1400  doxycycline (VIBRAMYCIN) 100 mg in sodium chloride 0.9 % 250 mL IVPB  Status:  Discontinued        100 mg 125 mL/hr over 120 Minutes Intravenous Every 12 hours 05/01/21 1336 05/01/21 2018   05/01/21 1345  ceFEPIme (MAXIPIME) 2 g in sodium chloride 0.9 % 100 mL IVPB        2 g 200 mL/hr over 30 Minutes Intravenous  Once 05/01/21 1336 05/01/21 1804       Objective:  Vital Signs  Vitals:   06/03/21 0500 06/03/21 0817 06/03/21 0838 06/03/21 0841  BP:  (!) 150/64    Pulse:  66    Resp:  18    Temp:  98 F (36.7 C)    TempSrc:  Oral    SpO2:  99% 95% 94%  Weight: 80 kg       Intake/Output Summary (Last 24 hours) at 06/03/2021 1151 Last data filed at 06/02/2021 2132 Gross per 24 hour  Intake --  Output 800 ml  Net -800 ml    Filed Weights   06/01/21 0811 06/02/21 0630 06/03/21 0500  Weight: 83.5 kg 83.1 kg 80 kg    General appearance: Awake  alert.  In no distress Resp: Less tachypneic.  Diminished air entry at the bases.  No definite wheezing or rhonchi.  Few crackles. Cardio: S1-S2 is irregularly irregular GI: Abdomen is soft.  Nontender nondistended.  Bowel sounds are present normal.  No masses organomegaly     Lab Results:  Data Reviewed: I have personally reviewed following labs and imaging studies  CBC: Recent Labs  Lab 05/30/21 0341 06/01/21 0232 06/03/21 0351  WBC 5.5 5.6 7.9  HGB 9.1* 9.3* 10.3*  HCT 29.7* 31.0* 34.0*  MCV 95.8 96.3 96.0  PLT 356 359 316     Basic Metabolic Panel: Recent Labs  Lab 05/30/21 0341 06/01/21 0232 06/03/21 0351  NA 133* 135 137  K 4.4 4.2 4.3  CL 100 97* 99  CO2 29 32 29  GLUCOSE 203* 245* 96  BUN 21 23 23   CREATININE 0.71 0.89 0.79  CALCIUM 7.7* 8.1* 8.2*  MG  --  2.0 2.0  PHOS  --  4.0  --      GFR: Estimated Creatinine Clearance: 82.2 mL/min (by C-G formula based on SCr of 0.79 mg/dL).   CBG: Recent Labs  Lab 06/02/21 2110 06/02/21 2311 06/03/21 0538 06/03/21 0819 06/03/21 1108  GLUCAP 204* 174* 180* 267* 188*      Radiology Studies: No results found.     LOS: 33 days   Ronald [name] [employer] on [email or web address]  06/03/2021, 11:51 AM

## 2021-06-03 NOTE — Progress Notes (Signed)
Placed pt on CPAP, pt immediately stated he was not wearing it, ask to take it off pt.  Pt placed back on 2L .

## 2021-06-04 ENCOUNTER — Inpatient Hospital Stay (HOSPITAL_COMMUNITY): Payer: Medicare HMO

## 2021-06-04 DIAGNOSIS — J449 Chronic obstructive pulmonary disease, unspecified: Secondary | ICD-10-CM | POA: Diagnosis not present

## 2021-06-04 DIAGNOSIS — I4892 Unspecified atrial flutter: Secondary | ICD-10-CM | POA: Diagnosis not present

## 2021-06-04 DIAGNOSIS — E440 Moderate protein-calorie malnutrition: Secondary | ICD-10-CM | POA: Diagnosis not present

## 2021-06-04 DIAGNOSIS — J9602 Acute respiratory failure with hypercapnia: Secondary | ICD-10-CM | POA: Diagnosis not present

## 2021-06-04 HISTORY — PX: IR GASTROSTOMY TUBE MOD SED: IMG625

## 2021-06-04 LAB — CBC
HCT: 32.8 % — ABNORMAL LOW (ref 39.0–52.0)
Hemoglobin: 10 g/dL — ABNORMAL LOW (ref 13.0–17.0)
MCH: 29 pg (ref 26.0–34.0)
MCHC: 30.5 g/dL (ref 30.0–36.0)
MCV: 95.1 fL (ref 80.0–100.0)
Platelets: 343 10*3/uL (ref 150–400)
RBC: 3.45 MIL/uL — ABNORMAL LOW (ref 4.22–5.81)
RDW: 17.2 % — ABNORMAL HIGH (ref 11.5–15.5)
WBC: 6.5 10*3/uL (ref 4.0–10.5)
nRBC: 0 % (ref 0.0–0.2)

## 2021-06-04 LAB — PROTIME-INR
INR: 1.1 (ref 0.8–1.2)
Prothrombin Time: 14.3 seconds (ref 11.4–15.2)

## 2021-06-04 LAB — GLUCOSE, CAPILLARY
Glucose-Capillary: 100 mg/dL — ABNORMAL HIGH (ref 70–99)
Glucose-Capillary: 109 mg/dL — ABNORMAL HIGH (ref 70–99)
Glucose-Capillary: 124 mg/dL — ABNORMAL HIGH (ref 70–99)
Glucose-Capillary: 143 mg/dL — ABNORMAL HIGH (ref 70–99)
Glucose-Capillary: 148 mg/dL — ABNORMAL HIGH (ref 70–99)
Glucose-Capillary: 168 mg/dL — ABNORMAL HIGH (ref 70–99)
Glucose-Capillary: 96 mg/dL (ref 70–99)

## 2021-06-04 MED ORDER — MIDAZOLAM HCL 2 MG/2ML IJ SOLN
INTRAMUSCULAR | Status: AC
  Filled 2021-06-04: qty 2

## 2021-06-04 MED ORDER — INSULIN GLARGINE-YFGN 100 UNIT/ML ~~LOC~~ SOLN
15.0000 [IU] | Freq: Two times a day (BID) | SUBCUTANEOUS | Status: DC
  Administered 2021-06-05 – 2021-06-06 (×3): 15 [IU] via SUBCUTANEOUS
  Filled 2021-06-04 (×4): qty 0.15

## 2021-06-04 MED ORDER — GLUCAGON HCL (RDNA) 1 MG IJ SOLR
INTRAMUSCULAR | Status: AC | PRN
  Administered 2021-06-04: 1 mg via INTRAVENOUS

## 2021-06-04 MED ORDER — LIDOCAINE HCL 1 % IJ SOLN
INTRAMUSCULAR | Status: AC
  Filled 2021-06-04: qty 20

## 2021-06-04 MED ORDER — OSMOLITE 1.5 CAL PO LIQD: 900.0000 mL | ORAL | Status: DC

## 2021-06-04 MED ORDER — GLUCAGON HCL RDNA (DIAGNOSTIC) 1 MG IJ SOLR
INTRAMUSCULAR | Status: AC
  Filled 2021-06-04: qty 1

## 2021-06-04 MED ORDER — APIXABAN 2.5 MG PO TABS
2.5000 mg | ORAL_TABLET | Freq: Two times a day (BID) | ORAL | Status: DC
  Administered 2021-06-05 – 2021-06-06 (×3): 2.5 mg
  Filled 2021-06-04 (×3): qty 1

## 2021-06-04 MED ORDER — IOHEXOL 300 MG/ML  SOLN
100.0000 mL | Freq: Once | INTRAMUSCULAR | Status: AC | PRN
  Administered 2021-06-04: 09:00:00 20 mL

## 2021-06-04 MED ORDER — CEFAZOLIN SODIUM-DEXTROSE 2-4 GM/100ML-% IV SOLN
INTRAVENOUS | Status: AC
  Administered 2021-06-04: 09:00:00 2 g via INTRAVENOUS
  Filled 2021-06-04: qty 100

## 2021-06-04 MED ORDER — PROSOURCE TF PO LIQD
45.0000 mL | Freq: Three times a day (TID) | ORAL | Status: DC
  Administered 2021-06-05 – 2021-06-06 (×5): 45 mL
  Filled 2021-06-04 (×5): qty 45

## 2021-06-04 MED ORDER — FENTANYL CITRATE (PF) 100 MCG/2ML IJ SOLN
INTRAMUSCULAR | Status: AC
  Filled 2021-06-04: qty 2

## 2021-06-04 MED ORDER — MIDAZOLAM HCL 2 MG/2ML IJ SOLN
INTRAMUSCULAR | Status: AC | PRN
  Administered 2021-06-04: 1 mg via INTRAVENOUS
  Administered 2021-06-04 (×2): .5 mg via INTRAVENOUS

## 2021-06-04 MED ORDER — FENTANYL CITRATE (PF) 100 MCG/2ML IJ SOLN
INTRAMUSCULAR | Status: AC | PRN
  Administered 2021-06-04 (×3): 25 ug via INTRAVENOUS

## 2021-06-04 MED ORDER — LIDOCAINE HCL 1 % IJ SOLN
INTRAMUSCULAR | Status: AC | PRN
  Administered 2021-06-04: 10 mL via INTRADERMAL

## 2021-06-04 NOTE — Procedures (Signed)
Interventional Radiology Procedure Note  Procedure: Percutaneous gastrostomy tube placement  Indication: Dysphagia  Findings: Please refer to procedural dictation for full description.  Complications: None  EBL: < 10 mL  Miachel Roux, MD [phone]

## 2021-06-04 NOTE — Progress Notes (Signed)
°   06/04/21 0752  Assess: MEWS Score  Pulse Rate 77  ECG Heart Rate 88  Resp (!) 29  Level of Consciousness Alert  SpO2 97 %  Assess: MEWS Score  MEWS Temp 0  MEWS Systolic 0  MEWS Pulse 0  MEWS RR 2  MEWS LOC 0  MEWS Score 2  MEWS Score Color Yellow  Assess: if the MEWS score is Yellow or Red  Were vital signs taken at a resting state? Yes  Focused Assessment No change from prior assessment  Early Detection of Sepsis Score *See Row Information* Low  MEWS guidelines implemented *See Row Information* No, altered LOC is baseline  Take Vital Signs  Increase Vital Sign Frequency  Yellow: Q 2hr X 2 then Q 4hr X 2, if remains yellow, continue Q 4hrs  Escalate  MEWS: Escalate Yellow: discuss with charge nurse/RN and consider discussing with provider and RRT  Notify: Charge Nurse/RN  Name of Charge Nurse/RN Notified Lauren,RN  Date Charge Nurse/RN Notified 06/04/21  Time Charge Nurse/RN Notified 1638  Notify: Provider  Provider Name/Title Bonnielee Haff,  Date Provider Notified 06/04/21  Time Provider Notified [phone]  Notification Type  (secured chat)  Provider response  (no new orders at this time.)

## 2021-06-04 NOTE — Plan of Care (Signed)

## 2021-06-04 NOTE — Progress Notes (Signed)
RN notified pharmacy about this patient contrast solution. RN was informed that contrast would have to be given at radiology.

## 2021-06-04 NOTE — Progress Notes (Signed)
Pt left unit to IR for peg tube placement . Pt remains alert/responsive in no apparent distres. No complaints.

## 2021-06-04 NOTE — Progress Notes (Signed)
PT Cancellation Note  Patient Details Name: DEMORIO SEELEY MRN: [id number] DOB: [birth year]   Cancelled Treatment:    Reason Eval/Treat Not Completed: Patient at procedure or test/unavailable (Pt. is off floor for PEG tube placement.  PT to f/u as able.)  Soni Kegel A. Robbin Escher, PT, DPT Acute Rehabilitation Services Office: [place] 06/04/2021, 9:31 AM

## 2021-06-04 NOTE — Progress Notes (Signed)
[facility] PROGRESS NOTE   Ronald [name] KGM:[id number] DOB: 16-Apr-1946 DOA: 05/01/2021  PCP: [name], FNP  Brief History/Interval Summary: [name] is a 75 year old male with past medical history of COPD, Gold stage III-IV, tracheal stenosis s/p trach in 2018, CAD s/p DES x2 to LAD in 2015, hypertension, hyperlipidemia, chronic respiratory failure on 2 L, HFpEF, diabetes mellitus type 2 presented with shortness of breath.  He was started on steroids and DuoNebs by EMS, placed on CPAP without improvement.  Patient was intubated for airway protection.  Was initially admitted to intensive care unit. Patient had 2 episodes of bright red blood per rectum.  GI was consulted. They evaluated the patient, but felt that colonoscopy in the setting of the patient's active cardiopulmonary comorbidities presented risks due to the need for anesthesia that were best to be avoided.   Continue to have very poor oral intake requiring core track feeding tube.  Despite encouragement patient's oral intake did not improve.  PEG tube was ordered and placed today.  Plan is to go to SNF eventually.  Consultants:  Critical care medicine Gastroenterology Interventional radiology  Procedures: None reported    Subjective/Interval History: Patient seen after he returned from his PEG tube placement.  Denies any complaints.  Denies any shortness of breath or chest pain specifically   Assessment/Plan:  Lower GI bleed/acute blood loss anemia -Patient had 2 large bloody bowel movements 05/16/21 -Apixaban was held; discussed with family regarding risks/benefits -Colonoscopy earlier this year showed diverticulosis and internal hemorrhoids Was also seen by gastroenterology who recommended monitoring and consider doing CT angiogram if he has recurrence of bleeding. No further recurrence of bleeding has been noted.    Poor PO intake Patient remains on tube feedings.  Oral intake remains very poor.    Looks like he will need a PEG tube going forward.  Discussed with patient and he was agreeable.  This was also discussed with patient's son and daughter-in-law.  They have discussed with patient and would like to proceed with plans for PEG tube. Interventional radiology was consulted.  Patient underwent PEG tube placement today.  Cleared for medication use today but feedings can be initiated only tomorrow.  Acute on chronic hypoxemic and hypercapnic respiratory failure -Secondary to COPD exacerbation; history of tracheal stenosis -Completed steroid taper -He is ordered BiPAP nightly though he is not being cooperative with same. -Continue bronchodilators, Pulmicort, Brovana, DuoNeb - encourage incentive spirometer use  There was some concern for fluid overload on 12/23.  Chest x-ray did not show any new changes.  Showed persistent opacities for which he has been treated with antibiotics previously.  He was given furosemide with good diuresis.  Given additional dose of furosemide yesterday.  Spectrila status is stable.  He is persistently tachypneic but asymptomatic.  Saturations are in the mid 90s on 2 L of oxygen by nasal cannula.  Changed over to oral diuretics. Echocardiogram was done on November 29 and shows EF to be 50 to 55% without any wall motion abnormalities. Nebulizer treatments as needed.  Right leg pain Patient complains of pain in the right thigh and hip area.  He underwent hip x-ray on 12/9 which did not show any injury.  X-ray of the knee and femur were done on 12/21 which did not show any acute findings.   CT scan of the abdomen done on 12/22 to evaluate anatomy before PEG tube placement did suggest concern for retroperitoneal hematoma based on the appearance of the right  iliacus musculature.  This seemed to correspond to the bruising noted in his posterior thigh on the right.  Could have had a muscular strain.  Hemoglobin has been stable.  He has been hemodynamically stable.  This  likely occurred several days ago.  Do not feel that he needs any further testing at this time.   We will continue to monitor clinically for now.  Atrial flutter with RVR Occasional elevation in heart rate is noted.  He is on amiodarone and carvedilol which is being continued. -Eliquis held due to significant drop in hemoglobin from GI bleed.  Risks of holding anticoagulation was discussed with patient's family. Could rechallenge him with a lower dose of anticoagulation since now it has been 2 weeks since his bleeding episode.   We were waiting till PEG tube was placed.  Will initiate Eliquis at lower dose starting tomorrow.  Discussed with patient's son who was agreeable.  Diabetes mellitus type 2 -Last A1c 7.5% on 05/02/2021 Remains on glargine insulin and SSI.  Left-sided renal lesion Incidentally noted on CT scan.  Renal ultrasound only showed simple cyst.  No further work-up at this time.   Acute kidney injury Creatinine had gone up to 1.32 on 05/17/21.  Resolved with IV fluids.   Acute metabolic encephalopathy -Resolved -Continue Seroquel, Xanax as needed  Dysphagia Patient is on DYS 2 diet with thin liquids.  Very poor oral intake is noted as discussed above.  PEG tube placed on 12/27.   Hypotension Hypotension appears to have resolved.  Moderate protein calorie malnutrition Nutrition Problem: Moderate Malnutrition Etiology: acute illness (influenza A)  Signs/Symptoms: mild muscle depletion, mild fat depletion   DVT Prophylaxis: SCDs Code Status: Full code Family Communication: Discussed with patient.  Family being updated every other day. Disposition Plan: Skilled nursing facility after PEG tube has been placed.  Feedings through the PEG tube to be initiated tomorrow.  If all is stable then he could potentially go to SNF on 12/29.  Status is: Inpatient  Remains inpatient appropriate because: Poor oral intake.  Requiring tube feedings      Medications:  Scheduled:  amiodarone  100 mg Per Tube Daily   arformoterol  15 mcg Nebulization BID   aspirin  81 mg Per Tube Daily   atorvastatin  40 mg Per Tube QPM   budesonide (PULMICORT) nebulizer solution  0.5 mg Nebulization BID   carvedilol  37.5 mg Per Tube BID WC   Chlorhexidine Gluconate Cloth  6 each Topical Daily   docusate  100 mg Per Tube BID   escitalopram  20 mg Per Tube Daily   feeding supplement  237 mL Oral BID BM   [START ON 06/05/2021] feeding supplement (OSMOLITE 1.5 CAL)  900 mL Per Tube Q24H   [START ON 06/05/2021] feeding supplement (PROSource TF)  45 mL Per Tube TID   furosemide  20 mg Oral Daily   guaiFENesin  5 mL Per Tube Q6H   insulin aspart  0-9 Units Subcutaneous TID WC   [START ON 06/05/2021] insulin glargine-yfgn  15 Units Subcutaneous BID   iohexol  75 mL Per Tube Once   isosorbide dinitrate  10 mg Per Tube BID   mouth rinse  15 mL Mouth Rinse BID   melatonin  3 mg Per Tube QHS   nicotine  14 mg Transdermal Daily   pantoprazole sodium  40 mg Per Tube q1800   polyethylene glycol  17 g Per Tube Daily   QUEtiapine  100 mg Per Tube  QHS   QUEtiapine  25 mg Per Tube Daily   Continuous:  sodium chloride Stopped (06/01/21 2232)   EHU:DJSHFWYOVZCHY, ALPRAZolam, bisacodyl, hydrALAZINE, ipratropium, levalbuterol, metoprolol tartrate, ondansetron (ZOFRAN) IV, QUEtiapine, sennosides  Antibiotics: Anti-infectives (From admission, onward)    Start     Dose/Rate Route Frequency Ordered Stop   06/04/21 1000  ceFAZolin (ANCEF) IVPB 2g/100 mL premix        2 g 200 mL/hr over 30 Minutes Intravenous To Radiology 05/31/21 1222 06/04/21 0911   05/06/21 1015  oseltamivir (TAMIFLU) capsule 75 mg        75 mg Per Tube 2 times daily 05/06/21 0917 05/06/21 0937   05/05/21 2100  azithromycin (ZITHROMAX) tablet 500 mg        500 mg Per Tube Every 24 hr x 2 05/04/21 2219 05/05/21 2134   05/04/21 2100  azithromycin (ZITHROMAX) tablet 500 mg  Status:  Discontinued        500 mg Oral  Every 24 hr x 2 05/04/21 1110 05/04/21 2219   05/03/21 2200  oseltamivir (TAMIFLU) capsule 30 mg  Status:  Discontinued        30 mg Per Tube 2 times daily 05/03/21 1048 05/06/21 0917   05/03/21 1330  cefTRIAXone (ROCEPHIN) 2 g in sodium chloride 0.9 % 100 mL IVPB        2 g 200 mL/hr over 30 Minutes Intravenous Every 24 hours 05/03/21 1244 05/09/21 1504   05/03/21 1300  cefTRIAXone (ROCEPHIN) 1 g in sodium chloride 0.9 % 100 mL IVPB  Status:  Discontinued        1 g 200 mL/hr over 30 Minutes Intravenous Every 24 hours 05/03/21 1210 05/03/21 1244   05/01/21 2300  cefTRIAXone (ROCEPHIN) 1 g in sodium chloride 0.9 % 100 mL IVPB  Status:  Discontinued        1 g 200 mL/hr over 30 Minutes Intravenous Every 24 hours 05/01/21 1957 05/02/21 1102   05/01/21 2300  oseltamivir (TAMIFLU) capsule 75 mg  Status:  Discontinued        75 mg Per Tube 2 times daily 05/01/21 2221 05/03/21 1048   05/01/21 2200  ceFEPIme (MAXIPIME) 2 g in sodium chloride 0.9 % 100 mL IVPB  Status:  Discontinued        2 g 200 mL/hr over 30 Minutes Intravenous Every 8 hours 05/01/21 1346 05/01/21 1957   05/01/21 2200  oseltamivir (TAMIFLU) 6 MG/ML suspension 75 mg  Status:  Discontinued        75 mg Per Tube 2 times daily 05/01/21 1957 05/01/21 2221   05/01/21 2100  azithromycin (ZITHROMAX) 500 mg in sodium chloride 0.9 % 250 mL IVPB  Status:  Discontinued        500 mg 250 mL/hr over 60 Minutes Intravenous Every 24 hours 05/01/21 1957 05/04/21 1109   05/01/21 1400  doxycycline (VIBRAMYCIN) 100 mg in sodium chloride 0.9 % 250 mL IVPB  Status:  Discontinued        100 mg 125 mL/hr over 120 Minutes Intravenous Every 12 hours 05/01/21 1336 05/01/21 2018   05/01/21 1345  ceFEPIme (MAXIPIME) 2 g in sodium chloride 0.9 % 100 mL IVPB        2 g 200 mL/hr over 30 Minutes Intravenous  Once 05/01/21 1336 05/01/21 1804       Objective:  Vital Signs  Vitals:   06/04/21 0925 06/04/21 0954 06/04/21 1030 06/04/21 1100  BP: 135/70  (!) 122/49 (!) 163/59 (!) 121/47  Pulse: 87 95    Resp: 19 (!) 25  (!) 24  Temp:  98.3 F (36.8 C) 98 F (36.7 C) 98.4 F (36.9 C)  TempSrc:  Oral Oral Oral  SpO2: 93% 96%    Weight:        Intake/Output Summary (Last 24 hours) at 06/04/2021 1139 Last data filed at 06/03/2021 1407 Gross per 24 hour  Intake --  Output 900 ml  Net -900 ml    Filed Weights   06/02/21 0630 06/03/21 0500 06/04/21 0500  Weight: 83.1 kg 80 kg 79.9 kg    General appearance: Awake alert.  In no distress Resp: Mildly tachypneic but no use of accessory muscles noted.  Diminished air entry at the bases.  No wheezing or rhonchi.  No crackles. Cardio: S1-S2 is irregularly irregular GI: Abdomen is soft.  PEG tube is noted.  No bleeding appreciated. Extremities: No edema.  Full range of motion of lower extremities. Neurologic:  No focal neurological deficits.       Lab Results:  Data Reviewed: I have personally reviewed following labs and imaging studies  CBC: Recent Labs  Lab 05/30/21 0341 06/01/21 0232 06/03/21 0351 06/04/21 0344  WBC 5.5 5.6 7.9 6.5  HGB 9.1* 9.3* 10.3* 10.0*  HCT 29.7* 31.0* 34.0* 32.8*  MCV 95.8 96.3 96.0 95.1  PLT 356 359 316 343     Basic Metabolic Panel: Recent Labs  Lab 05/30/21 0341 06/01/21 0232 06/03/21 0351  NA 133* 135 137  K 4.4 4.2 4.3  CL 100 97* 99  CO2 29 32 29  GLUCOSE 203* 245* 96  BUN 21 23 23   CREATININE 0.71 0.89 0.79  CALCIUM 7.7* 8.1* 8.2*  MG  --  2.0 2.0  PHOS  --  4.0  --      GFR: Estimated Creatinine Clearance: 82.2 mL/min (by C-G formula based on SCr of 0.79 mg/dL).   CBG: Recent Labs  Lab 06/03/21 2116 06/04/21 0007 06/04/21 0403 06/04/21 0754 06/04/21 1124  GLUCAP 177* 143* 124* 109* 168*      Radiology Studies: IR GASTROSTOMY TUBE MOD SED  Result Date: 06/04/2021 INDICATION: Dysphagia Nutritional support EXAM: Ultrasound and fluoroscopy guided percutaneous gastrostomy tube placement MEDICATIONS: Ancef 2  gm IV; Antibiotics were administered within 1 hour of the procedure. Glucagon 1 mg IV ANESTHESIA/SEDATION: Moderate (conscious) sedation was employed during this procedure. A total of Versed 2 mg and Fentanyl 75 mcg was administered intravenously by the radiology nurse. Total intra-service moderate Sedation Time: 16 minutes. The patient's level of consciousness and vital signs were monitored continuously by radiology nursing throughout the procedure under my direct supervision. CONTRAST:  20 mL Omnipaque 300-administered into the gastric lumen. FLUOROSCOPY TIME:  Fluoroscopy Time: 4.8 minutes (38 mGy). COMPLICATIONS: None immediate. PROCEDURE: Informed written consent was obtained from the patient after a thorough discussion of the procedural risks, benefits and alternatives. All questions were addressed. Maximal Sterile Barrier Technique was utilized including caps, mask, sterile gowns, sterile gloves, sterile drape, hand hygiene and skin antiseptic. A timeout was performed prior to the initiation of the procedure. An orogastric tube was placed with fluoroscopic guidance. The anterior abdomen was prepped and draped in sterile fashion. Ultrasound evaluation of the left upper quadrant was performed to confirm the position of the liver. The skin and subcutaneous tissues were anesthetized with 1% lidocaine. Single gastropexy was inserted using fluoroscopic guidance. 17 gauge needle was directed into the distended stomach with fluoroscopic guidance. A wire was advanced into the stomach. 9-French  vascular sheath was placed and the orogastric tube was snared using a [employer] snare device. The orogastric tube and snare were pulled out of the patient's mouth. The snare device was connected to a 20-French gastrostomy tube. The snare device and gastrostomy tube were pulled through the patient's mouth and out the anterior abdominal wall. The gastrostomy tube was cut to an appropriate length. Contrast injection through  gastrostomy tube confirmed placement within the stomach. Fluoroscopic images were obtained for documentation. The gastrostomy tube was flushed with normal saline. IMPRESSION: 20 French percutaneous gastrostomy tube placement as above. Electronically Signed   By: Miachel Roux M.D.   On: 06/04/2021 09:32       LOS: 34 days   [facility] Pager on [email or web address]  06/04/2021, 11:39 AM

## 2021-06-05 DIAGNOSIS — E440 Moderate protein-calorie malnutrition: Secondary | ICD-10-CM | POA: Diagnosis not present

## 2021-06-05 DIAGNOSIS — J9602 Acute respiratory failure with hypercapnia: Secondary | ICD-10-CM | POA: Diagnosis not present

## 2021-06-05 DIAGNOSIS — J449 Chronic obstructive pulmonary disease, unspecified: Secondary | ICD-10-CM | POA: Diagnosis not present

## 2021-06-05 DIAGNOSIS — I4892 Unspecified atrial flutter: Secondary | ICD-10-CM | POA: Diagnosis not present

## 2021-06-05 LAB — SARS CORONAVIRUS 2 (TAT 6-24 HRS): SARS Coronavirus 2: NEGATIVE

## 2021-06-05 LAB — GLUCOSE, CAPILLARY
Glucose-Capillary: 101 mg/dL — ABNORMAL HIGH (ref 70–99)
Glucose-Capillary: 106 mg/dL — ABNORMAL HIGH (ref 70–99)
Glucose-Capillary: 108 mg/dL — ABNORMAL HIGH (ref 70–99)
Glucose-Capillary: 136 mg/dL — ABNORMAL HIGH (ref 70–99)
Glucose-Capillary: 140 mg/dL — ABNORMAL HIGH (ref 70–99)
Glucose-Capillary: 171 mg/dL — ABNORMAL HIGH (ref 70–99)

## 2021-06-05 MED ORDER — GLUCERNA SHAKE PO LIQD
237.0000 mL | Freq: Two times a day (BID) | ORAL | Status: DC
  Administered 2021-06-05 – 2021-06-06 (×3): 237 mL via ORAL

## 2021-06-05 MED ORDER — OSMOLITE 1.5 CAL PO LIQD
900.0000 mL | ORAL | Status: DC
  Administered 2021-06-05: 10:00:00 900 mL

## 2021-06-05 MED ORDER — OSMOLITE 1.5 CAL PO LIQD
900.0000 mL | ORAL | Status: DC
Start: 2021-06-06 — End: 2021-06-05

## 2021-06-05 MED ORDER — FREE WATER
30.0000 mL | Status: DC
  Administered 2021-06-05 – 2021-06-06 (×8): 30 mL

## 2021-06-05 NOTE — Progress Notes (Signed)
Referring Physician(s): Dr. Bonnielee Haff  Supervising Physician: Mir, Sharen Heck  Patient Status:  [facility] - In-pt  Chief Complaint:  Gastrostomy tube placed 06/04/21 by Dr. Dwaine Gale  Subjective:  Pt sitting upright in bed. He reports pain, tenderness at the G-tube site.  Allergies: Gabapentin and Metformin and related  Medications: Prior to Admission medications   Medication Sig Start Date End Date Taking? Authorizing Provider  albuterol (VENTOLIN HFA) 108 (90 Base) MCG/ACT inhaler INHALE 2 PUFFS EVERY 6 HOURS AS NEEEDED Patient taking differently: Inhale 2 puffs into the lungs every 6 (six) hours as needed for wheezing or shortness of breath. 11/02/19  Yes Hawks, Christy A, FNP  ALPRAZolam (XANAX) 0.5 MG tablet Take 1 tablet (0.5 mg total) by mouth 2 (two) times daily as needed. for anxiety 01/17/21  Yes Hawks, Christy A, FNP  amLODipine (NORVASC) 5 MG tablet Take 1 tablet (5 mg total) by mouth daily. 02/04/21  Yes Hawks, Christy A, FNP  Ascorbic Acid (VITAMIN C) 1000 MG tablet Take 1,000 mg by mouth daily.   Yes [provider]  atorvastatin (LIPITOR) 40 MG tablet TAKE 1 TABLET BY MOUTH ONCE DAILY IN THE EVENING Patient taking differently: Take 40 mg by mouth every evening. 12/05/20  Yes Hawks, Christy A, FNP  cholecalciferol (VITAMIN D3) 25 MCG (1000 UNIT) tablet Take 1,000 Units by mouth daily.   Yes [provider]  escitalopram (LEXAPRO) 20 MG tablet Take 1 tablet (20 mg total) by mouth daily. 02/12/21  Yes Hawks, Christy A, FNP  fluticasone (FLONASE) 50 MCG/ACT nasal spray Place 2 sprays into both nostrils daily. Patient taking differently: Place 2 sprays into both nostrils daily as needed for allergies or rhinitis. 11/15/20  Yes Gwenlyn Perking, FNP  guaiFENesin (REFENESEN 400 PO) Take 2 tablets by mouth daily.   Yes [provider]  ibuprofen (ADVIL) 200 MG tablet Take 800 mg by mouth every 6 (six) hours as needed for mild pain.   Yes [provider]  ipratropium-albuterol (DUONEB) 0.5-2.5 (3) MG/3ML SOLN USE 1 AMPULE IN NEBULIZER 4 TIMES DAILY Patient taking differently: Take 3 mLs by nebulization 4 (four) times daily as needed (wheezing/shortness of breath). 04/29/21  Yes Hawks, Christy A, FNP  isosorbide mononitrate (IMDUR) 30 MG 24 hr tablet Take 1 tablet (30 mg total) by mouth daily. 04/09/21  Yes Branch, Alphonse Guild, MD  losartan (COZAAR) 100 MG tablet Take 1 tablet (100 mg total) by mouth daily. 02/05/21  Yes Hawks, Christy A, FNP  metoprolol tartrate (LOPRESSOR) 50 MG tablet Take 1 tablet (50 mg total) by mouth 2 (two) times daily. 03/04/21  Yes Hawks, Christy A, FNP  mirtazapine (REMERON) 15 MG tablet Take 1 tablet (15 mg total) by mouth at bedtime. 01/01/21  Yes Hawks, Christy A, FNP  nitroGLYCERIN (NITROSTAT) 0.4 MG SL tablet Place 0.4 mg under the tongue every 5 (five) minutes as needed for chest pain.   Yes [provider]  OXYGEN Inhale 3 L into the lungs at bedtime.   Yes [provider]  pantoprazole (PROTONIX) 40 MG tablet Take 1 tablet by mouth once daily Patient taking differently: 40 mg daily. 11/26/20  Yes Hawks, Christy A, FNP  predniSONE (DELTASONE) 10 MG tablet Take 2 tablets (20 mg total) by mouth daily. Patient taking differently: Take 20 mg by mouth daily. For 7 days 04/27/21  Yes Milton Ferguson, MD  Vibegron (GEMTESA) 75 MG TABS Take 75 mg by mouth daily. Patient taking differently: Take 75 mg by  mouth at bedtime. 04/18/21  Yes Irine Seal, MD  aspirin EC 81 MG tablet Take 1 tablet (81 mg total) by mouth daily. Patient not taking: Reported on 05/03/2021 10/15/18   Herminio Commons, MD  Blood Glucose Monitoring Suppl (ACCU-CHEK GUIDE ME) w/Device KIT Check BS daily Dx E11.9 05/16/20   [name] A, FNP  furosemide (LASIX) 20 MG tablet Take 1 tablet (20 mg total) by mouth 2 (two) times daily. Patient not taking: Reported on 05/03/2021 01/01/21   [name] A, FNP  glucose blood (ONE TOUCH ULTRA  TEST) test strip USE TO CHECK GLUCOSE ONCE DAILY 02/11/18   Terald Sleeper, PA-C  Lancets [facility] ULTRASOFT) lancets Use as instructed   DX E11.9 05/05/17   [name], FNP     Vital Signs: BP (!) 166/54    Pulse (!) 102    Temp 98 F (36.7 C) (Oral)    Resp 20    Wt 177 lb 7.5 oz (80.5 kg)    SpO2 93%    BMI 26.21 kg/m   Physical Exam Constitutional:      Appearance: He is ill-appearing.  HENT:     Head: Normocephalic and atraumatic.  Cardiovascular:     Rate and Rhythm: Tachycardia present.  Pulmonary:     Effort: Pulmonary effort is normal. No respiratory distress.  Abdominal:     Tenderness: There is abdominal tenderness.     Comments: G-tube in place. No redness, drainage or bleeding noted to site. Dressing C/D/I  Skin:    General: Skin is warm and dry.  Neurological:     Mental Status: He is alert.  Psychiatric:        Mood and Affect: Mood normal.        Behavior: Behavior normal.    Imaging: IR GASTROSTOMY TUBE MOD SED  Result Date: 06/04/2021 INDICATION: Dysphagia Nutritional support EXAM: Ultrasound and fluoroscopy guided percutaneous gastrostomy tube placement MEDICATIONS: Ancef 2 gm IV; Antibiotics were administered within 1 hour of the procedure. Glucagon 1 mg IV ANESTHESIA/SEDATION: Moderate (conscious) sedation was employed during this procedure. A total of Versed 2 mg and Fentanyl 75 mcg was administered intravenously by the radiology nurse. Total intra-service moderate Sedation Time: 16 minutes. The patient's level of consciousness and vital signs were monitored continuously by radiology nursing throughout the procedure under my direct supervision. CONTRAST:  20 mL Omnipaque 300-administered into the gastric lumen. FLUOROSCOPY TIME:  Fluoroscopy Time: 4.8 minutes (38 mGy). COMPLICATIONS: None immediate. PROCEDURE: Informed written consent was obtained from the patient after a thorough discussion of the procedural risks, benefits and alternatives. All  questions were addressed. Maximal Sterile Barrier Technique was utilized including caps, mask, sterile gowns, sterile gloves, sterile drape, hand hygiene and skin antiseptic. A timeout was performed prior to the initiation of the procedure. An orogastric tube was placed with fluoroscopic guidance. The anterior abdomen was prepped and draped in sterile fashion. Ultrasound evaluation of the left upper quadrant was performed to confirm the position of the liver. The skin and subcutaneous tissues were anesthetized with 1% lidocaine. Single gastropexy was inserted using fluoroscopic guidance. 17 gauge needle was directed into the distended stomach with fluoroscopic guidance. A wire was advanced into the stomach. 9-French vascular sheath was placed and the orogastric tube was snared using a Gooseneck snare device. The orogastric tube and snare were pulled out of the patient's mouth. The snare device was connected to a 20-French gastrostomy tube. The snare device and gastrostomy tube were pulled through the  patient's mouth and out the anterior abdominal wall. The gastrostomy tube was cut to an appropriate length. Contrast injection through gastrostomy tube confirmed placement within the stomach. Fluoroscopic images were obtained for documentation. The gastrostomy tube was flushed with normal saline. IMPRESSION: 20 French percutaneous gastrostomy tube placement as above. Electronically Signed   By: Miachel Roux M.D.   On: 06/04/2021 09:32    Labs:  CBC: Recent Labs    05/30/21 0341 06/01/21 0232 06/03/21 0351 06/04/21 0344  WBC 5.5 5.6 7.9 6.5  HGB 9.1* 9.3* 10.3* 10.0*  HCT 29.7* 31.0* 34.0* 32.8*  PLT 356 359 316 343    COAGS: Recent Labs    05/21/21 1117 06/04/21 0344  INR 1.1 1.1    BMP: Recent Labs    06/14/20 1633 09/12/20 1525 05/27/21 0219 05/30/21 0341 06/01/21 0232 06/03/21 0351  NA 139   < > 138 133* 135 137  K 4.4   < > 4.2 4.4 4.2 4.3  CL 98   < > 103 100 97* 99  CO2 31*   <  > 32 29 32 29  GLUCOSE 103*   < > 193* 203* 245* 96  BUN 17   < > 29* _0 CALCIUM 9.3   < > 7.9* 7.7* 8.1* 8.2*  CREATININE 0.95   < > 0.86 0.71 0.89 0.79  GFRNONAA 80   < > >60 >60 >60 >60  GFRAA 92  --   --   --   --   --    < > = values in this interval not displayed.    LIVER FUNCTION TESTS: Recent Labs    05/18/21 0219 05/19/21 0141 05/23/21 0052 05/26/21 0146  BILITOT 1.3* 1.6* 1.4* 1.2  AST 14* 15 12* 12*  ALT _1 ALKPHOS 57 54 68 62  PROT 4.8* 4.8* 5.3* 5.2*  ALBUMIN 2.2* 2.0* 2.0* 1.9*    Assessment and Plan:  G-tube in place. Site unremarkable with no redness, bleeding or drainage noted. Dressing is C/D/I. Primary Rn states that G-tube is working appropriately with no leaking. Pt has been receiving tube feeds through G-tube with no difficulty. Pt c/o pain, tenderness at the site. Pt reassured that that this should resolve. Pt in NAD.  Pt is afebrile, tachycardic.  Labs WNL.  Change dressing q shift or as needed.  Please contact IR with questions or concerns with G-tube.    Electronically Signed: Tyson Alias, NP 06/05/2021, 1:38 PM   I spent a total of 15 Minutes at the the patient's bedside AND on the patient's hospital floor or unit, greater than 50% of which was counseling/coordinating care for gastrostomy tube placement.

## 2021-06-05 NOTE — TOC Progression Note (Addendum)
Transition of Care [facility]) - Progression Note    Patient Details  Name: Ronald [name] MRN: [id number] Date of Birth: July 27, 1945  Transition of Care [facility]) CM/SW [place], [place] Phone Number: 06/05/2021, 11:39 AM  Clinical Narrative:    CSW spoke with [facility] rehab to confirm DC for tomorrow. They note they will have a bed, but are concerned about taking him under the waiver, as he has refused PT several times. CSW notified medical team and they will encourage pt to participate as he risks losing his bed. CSW also requested a covid test to prep pt to DC tomorrow. TOC will continue to follow for DC needs.  1:45 Pt has participated in therapy and an updated note is on the chart. CSW left VM for facility to notify them. Plan is to DC tomorrow.   Expected Discharge Plan: [place] (vs SNF) Barriers to Discharge: Continued Medical Work up  Expected Discharge Plan and Services Expected Discharge Plan: [place] (vs SNF)   Discharge Planning Services: CM Consult   Living arrangements for the past 2 months: Single Family Home                                       Social Determinants of Health (SDOH) Interventions    Readmission Risk Interventions Readmission Risk Prevention Plan 09/28/2020  Transportation Screening Complete  HRI or Home Care Consult Complete  Social Work Consult for [place] Planning/Counseling Complete  Palliative Care Screening Not Applicable  Medication Review [employer]) Complete  Some recent data might be hidden

## 2021-06-05 NOTE — Progress Notes (Signed)
Physical Therapy Treatment Patient Details Name: Ronald [name] MRN: [id number] DOB: [birth year] Today's Date: 06/05/2021   History of Present Illness 75 y/o male who presented on 11/23 w/ SOB with acute respiratory failure requiring intubation in ED, unsuccessful extubation 11/25. Pt successfully extubated on 12/1 with some difficulty tolerating BiPAP. PEG placement 12/27. PMHx: COPD, tracheal stenosis, chronic respiratory failure on 2L, CAD, DM II, HTN, HLD    PT Comments    Pt received in bed, encouragement needed for participation in therapy. LE exercises prior to mobilizing to/from EOB. He required mod/max assist bed mobility, and +2 mod assist standing attempts with RW. Pt only able to clear bottom a few inches from the bed. Unable to attain full stance with RW. Returned to supine in bed.     Recommendations for follow up therapy are one component of a multi-disciplinary discharge planning process, led by the attending physician.  Recommendations may be updated based on patient status, additional functional criteria and insurance authorization.  Follow Up Recommendations  Skilled nursing-short term rehab (<3 hours/day)     Assistance Recommended at Discharge Frequent or constant Supervision/Assistance  Equipment Recommendations  Rolling walker (2 wheels);BSC/3in1    Recommendations for Other Services       Precautions / Restrictions Precautions Precautions: Fall;Other (comment) Precaution Comments: PEG     Mobility  Bed Mobility Overal bed mobility: Needs Assistance Bed Mobility: Supine to Sit;Sit to Supine     Supine to sit: Mod assist;HOB elevated Sit to supine: Max assist   General bed mobility comments: +rail, increased time, cues for sequending, assist for all aspects of mobility    Transfers Overall transfer level: Needs assistance Equipment used: Rolling walker (2 wheels) Transfers: Sit to/from Stand Sit to Stand: Mod assist;+2 physical assistance;+2  safety/equipment           General transfer comment: able to clear bottom a few inches from bed. Unable to attain full upright stance with RW. Required return to EOB after a few seconds.    Ambulation/Gait               General Gait Details: unable   Stairs             Wheelchair Mobility    Modified Rankin (Stroke Patients Only)       Balance Overall balance assessment: Needs assistance Sitting-balance support: Feet supported;Bilateral upper extremity supported Sitting balance-Leahy Scale: Poor     Standing balance support: Bilateral upper extremity supported;During functional activity;Reliant on assistive device for balance Standing balance-Leahy Scale: Poor                              Cognition Arousal/Alertness: Awake/alert Behavior During Therapy: WFL for tasks assessed/performed Overall Cognitive Status: Impaired/Different from baseline Area of Impairment: Orientation;Attention;Memory;Following commands;Safety/judgement;Awareness;Problem solving                 Orientation Level: Disoriented to;Time Current Attention Level: Selective Memory: Decreased recall of precautions;Decreased short-term memory Following Commands: Follows one step commands consistently;Follows one step commands with increased time Safety/Judgement: Decreased awareness of deficits;Decreased awareness of safety Awareness: Emergent Problem Solving: Slow processing;Requires verbal cues;Requires tactile cues;Decreased initiation;Difficulty sequencing          Exercises General Exercises - Lower Extremity Ankle Circles/Pumps: AROM;Both;10 reps;Supine Heel Slides: AAROM;10 reps;Supine;Left;Right Hip ABduction/ADduction: AAROM;Right;Left;5 reps;Supine    General Comments General comments (skin integrity, edema, etc.): VSS on 2L      Pertinent Vitals/Pain Pain Assessment:  Faces Faces Pain Scale: Hurts little more Pain Location: R groin Pain Descriptors  / Indicators: Grimacing;Discomfort;Guarding Pain Intervention(s): Limited activity within patient's tolerance;Monitored during session    Home Living                          Prior Function            PT Goals (current goals can now be found in the care plan section) Acute Rehab PT Goals Patient Stated Goal: return home Progress towards PT goals: Progressing toward goals    Frequency    Min 2X/week      PT Plan Current plan remains appropriate    Co-evaluation              AM-PAC PT "6 Clicks" Mobility   Outcome Measure  Help needed turning from your back to your side while in a flat bed without using bedrails?: A Lot Help needed moving from lying on your back to sitting on the side of a flat bed without using bedrails?: A Lot Help needed moving to and from a bed to a chair (including a wheelchair)?: Total Help needed standing up from a chair using your arms (e.g., wheelchair or bedside chair)?: Total Help needed to walk in hospital room?: Total Help needed climbing 3-5 steps with a railing? : Total 6 Click Score: 8    End of Session Equipment Utilized During Treatment: Oxygen;Gait belt Activity Tolerance: Patient tolerated treatment well Patient left: in bed;with call bell/phone within reach;with bed alarm set Nurse Communication: Mobility status PT Visit Diagnosis: Unsteadiness on feet (R26.81);Muscle weakness (generalized) (M62.81);Difficulty in walking, not elsewhere classified (R26.2);Pain Pain - Right/Left: Right Pain - part of body: Leg     Time: 6967-8938 PT Time Calculation (min) (ACUTE ONLY): 26 min  Charges:  $Therapeutic Exercise: 8-22 mins $Therapeutic Activity: 8-22 mins                     Lorrin Goodell, PT  Office # [phone] Pager [phone]    Lorriane Shire 06/05/2021, 12:32 PM

## 2021-06-05 NOTE — Plan of Care (Signed)

## 2021-06-05 NOTE — Consult Note (Signed)
° °  [facility] CM Inpatient Consult   06/05/2021  Ronald [name] May 28, 1946 [id number]  Follow up:  [place] [employer]  Primary Care Provider:  [name], FNP, [place], an Embedded provider  Patient is currently being recommended for SNF for transition of care needs.  Plan:  Continue to follow. If patient goes to a SNF then his transition of care needs are to be met at that level of care.  [name], RN BSN [facility] Liaison  [phone] business mobile phone Toll free office [phone]  Fax number: [phone] [email or web address]

## 2021-06-05 NOTE — Progress Notes (Signed)
[facility] PROGRESS NOTE   Ronald [name] WIO:[id number] DOB: 1946/03/30 DOA: 05/01/2021  PCP: [name], FNP  Brief History/Interval Summary: Ronald [name] is a 75 year old male with past medical history of COPD, Gold stage III-IV, tracheal stenosis s/p trach in 2018, CAD s/p DES x2 to LAD in 2015, hypertension, hyperlipidemia, chronic respiratory failure on 2 L, HFpEF, diabetes mellitus type 2 presented with shortness of breath.  He was started on steroids and DuoNebs by EMS, placed on CPAP without improvement.  Patient was intubated for airway protection.  Was initially admitted to intensive care unit. Patient had 2 episodes of bright red blood per rectum.  GI was consulted. They evaluated the patient, but felt that colonoscopy in the setting of the patient's active cardiopulmonary comorbidities presented risks due to the need for anesthesia that were best to be avoided.   Continue to have very poor oral intake requiring core track feeding tube.  Despite encouragement patient's oral intake did not improve.  PEG tube was ordered and placed 12/27.  Plan is to go to SNF eventually.  Consultants:  Critical care medicine Gastroenterology Interventional radiology  Procedures: None reported    Subjective/Interval History:  Patient denies any complaints today, no shortness of breath, no chest pain  Assessment/Plan:  Lower GI bleed/acute blood loss anemia -Patient had 2 large bloody bowel movements 05/16/21 -Apixaban was held; discussed with family regarding risks/benefits -Colonoscopy earlier this year showed diverticulosis and internal hemorrhoids Was also seen by gastroenterology who recommended monitoring and consider doing CT angiogram if he has recurrence of bleeding. No further recurrence of bleeding has been noted.  -She has been resumed on low-dose Eliquis, continue to monitor H&H closely, and monitor for any signs of bleeding.   Poor PO intake Patient remains on  tube feedings.  Oral intake remains very poor.   Looks like he will need a PEG tube going forward.  Discussed with patient and he was agreeable.  This was also discussed with patient's son and daughter-in-law.  They have discussed with patient and would like to proceed with plans for PEG tube. Interventional radiology was consulted. -Tube inserted 12/27, has been using it for meds overnight, will start him on tube feed today, eventual plan for the patient is to continue tube feed at nighttime only, but will initiate today during daytime to monitor for next 24 hours.   Acute on chronic hypoxemic and hypercapnic respiratory failure -Secondary to COPD exacerbation; history of tracheal stenosis -Completed steroid taper -He is ordered BiPAP nightly though he is not being cooperative with same. -Continue bronchodilators, Pulmicort, Brovana, DuoNeb - encourage incentive spirometer use  There was some concern for fluid overload on 12/23.  Chest x-ray did not show any new changes.  Showed persistent opacities for which he has been treated with antibiotics previously.  He was given furosemide with good diuresis.  Given additional dose of furosemide yesterday.  Spectrila status is stable.  He is persistently tachypneic but asymptomatic.  Saturations are in the mid 90s on 2 L of oxygen by nasal cannula.  Changed over to oral diuretics. Echocardiogram was done on November 29 and shows EF to be 50 to 55% without any wall motion abnormalities. Nebulizer treatments as needed.  Right leg pain Patient complains of pain in the right thigh and hip area.  He underwent hip x-ray on 12/9 which did not show any injury.  X-ray of the knee and femur were done on 12/21 which did not show any acute findings.  CT scan of the abdomen done on 12/22 to evaluate anatomy before PEG tube placement did suggest concern for retroperitoneal hematoma based on the appearance of the right iliacus musculature.  This seemed to correspond to  the bruising noted in his posterior thigh on the right.  Could have had a muscular strain.  Hemoglobin has been stable.  He has been hemodynamically stable.  This likely occurred several days ago.  Do not feel that he needs any further testing at this time.   We will continue to monitor clinically for now.  Atrial flutter with RVR Occasional elevation in heart rate is noted.  He is on amiodarone and carvedilol which is being continued. -Eliquis held due to significant drop in hemoglobin from GI bleed.  Risks of holding anticoagulation was discussed with patient's family. Could rechallenge him with a lower dose of anticoagulation since now it has been 2 weeks since his bleeding episode.   We were waiting till PEG tube was placed.  Darted on lower dose Eliquis today, will continue to monitor closely, and if no signs of GI bleed, likely can increase to regular dose in few days.    Diabetes mellitus type 2 -Last A1c 7.5% on 05/02/2021 Remains on glargine insulin and SSI.  Left-sided renal lesion Incidentally noted on CT scan.  Renal ultrasound only showed simple cyst.  No further work-up at this time.   Acute kidney injury Creatinine had gone up to 1.32 on 05/17/21.  Resolved with IV fluids.   Acute metabolic encephalopathy -Resolved -Continue Seroquel, Xanax as needed  Dysphagia Patient is on DYS 2 diet with thin liquids.  Very poor oral intake is noted as discussed above.  PEG tube placed on 12/27.   Hypotension Hypotension appears to have resolved.  Moderate protein calorie malnutrition Nutrition Problem: Moderate Malnutrition Etiology: acute illness (influenza A)  Signs/Symptoms: mild muscle depletion, mild fat depletion   DVT Prophylaxis: SCDs Code Status: Full code Family Communication: Discussed with patient.  No family at bedside today.  Disposition Plan: Skilled nursing facility after PEG tube has been placed.  Feedings through the PEG tube to be initiated tomorrow.  If all  is stable then he could potentially go to SNF on 12/29.  Status is: Inpatient  Remains inpatient appropriate because: Poor oral intake.  Requiring tube feedings      Medications: Scheduled:  amiodarone  100 mg Per Tube Daily   apixaban  2.5 mg Per Tube BID   arformoterol  15 mcg Nebulization BID   atorvastatin  40 mg Per Tube QPM   budesonide (PULMICORT) nebulizer solution  0.5 mg Nebulization BID   carvedilol  37.5 mg Per Tube BID WC   Chlorhexidine Gluconate Cloth  6 each Topical Daily   docusate  100 mg Per Tube BID   escitalopram  20 mg Per Tube Daily   feeding supplement  237 mL Oral BID BM   feeding supplement (OSMOLITE 1.5 CAL)  900 mL Per Tube Q24H   feeding supplement (PROSource TF)  45 mL Per Tube TID   free water  30 mL Per Tube Q4H   furosemide  20 mg Oral Daily   guaiFENesin  5 mL Per Tube Q6H   insulin aspart  0-9 Units Subcutaneous TID WC   insulin glargine-yfgn  15 Units Subcutaneous BID   iohexol  75 mL Per Tube Once   isosorbide dinitrate  10 mg Per Tube BID   mouth rinse  15 mL Mouth Rinse BID   melatonin  3 mg Per Tube QHS   nicotine  14 mg Transdermal Daily   pantoprazole sodium  40 mg Per Tube q1800   polyethylene glycol  17 g Per Tube Daily   QUEtiapine  100 mg Per Tube QHS   QUEtiapine  25 mg Per Tube Daily   Continuous:  sodium chloride Stopped (06/01/21 2232)   AOZ:HYQMVHQIONGEX, ALPRAZolam, bisacodyl, hydrALAZINE, ipratropium, levalbuterol, metoprolol tartrate, ondansetron (ZOFRAN) IV, QUEtiapine, sennosides  Antibiotics: Anti-infectives (From admission, onward)    Start     Dose/Rate Route Frequency Ordered Stop   06/04/21 1000  ceFAZolin (ANCEF) IVPB 2g/100 mL premix        2 g 200 mL/hr over 30 Minutes Intravenous To Radiology 05/31/21 1222 06/04/21 0911   05/06/21 1015  oseltamivir (TAMIFLU) capsule 75 mg        75 mg Per Tube 2 times daily 05/06/21 0917 05/06/21 0937   05/05/21 2100  azithromycin (ZITHROMAX) tablet 500 mg        500  mg Per Tube Every 24 hr x 2 05/04/21 2219 05/05/21 2134   05/04/21 2100  azithromycin (ZITHROMAX) tablet 500 mg  Status:  Discontinued        500 mg Oral Every 24 hr x 2 05/04/21 1110 05/04/21 2219   05/03/21 2200  oseltamivir (TAMIFLU) capsule 30 mg  Status:  Discontinued        30 mg Per Tube 2 times daily 05/03/21 1048 05/06/21 0917   05/03/21 1330  cefTRIAXone (ROCEPHIN) 2 g in sodium chloride 0.9 % 100 mL IVPB        2 g 200 mL/hr over 30 Minutes Intravenous Every 24 hours 05/03/21 1244 05/09/21 1504   05/03/21 1300  cefTRIAXone (ROCEPHIN) 1 g in sodium chloride 0.9 % 100 mL IVPB  Status:  Discontinued        1 g 200 mL/hr over 30 Minutes Intravenous Every 24 hours 05/03/21 1210 05/03/21 1244   05/01/21 2300  cefTRIAXone (ROCEPHIN) 1 g in sodium chloride 0.9 % 100 mL IVPB  Status:  Discontinued        1 g 200 mL/hr over 30 Minutes Intravenous Every 24 hours 05/01/21 1957 05/02/21 1102   05/01/21 2300  oseltamivir (TAMIFLU) capsule 75 mg  Status:  Discontinued        75 mg Per Tube 2 times daily 05/01/21 2221 05/03/21 1048   05/01/21 2200  ceFEPIme (MAXIPIME) 2 g in sodium chloride 0.9 % 100 mL IVPB  Status:  Discontinued        2 g 200 mL/hr over 30 Minutes Intravenous Every 8 hours 05/01/21 1346 05/01/21 1957   05/01/21 2200  oseltamivir (TAMIFLU) 6 MG/ML suspension 75 mg  Status:  Discontinued        75 mg Per Tube 2 times daily 05/01/21 1957 05/01/21 2221   05/01/21 2100  azithromycin (ZITHROMAX) 500 mg in sodium chloride 0.9 % 250 mL IVPB  Status:  Discontinued        500 mg 250 mL/hr over 60 Minutes Intravenous Every 24 hours 05/01/21 1957 05/04/21 1109   05/01/21 1400  doxycycline (VIBRAMYCIN) 100 mg in sodium chloride 0.9 % 250 mL IVPB  Status:  Discontinued        100 mg 125 mL/hr over 120 Minutes Intravenous Every 12 hours 05/01/21 1336 05/01/21 2018   05/01/21 1345  ceFEPIme (MAXIPIME) 2 g in sodium chloride 0.9 % 100 mL IVPB        2 g 200 mL/hr over 30  Minutes  Intravenous  Once 05/01/21 1336 05/01/21 1804       Objective:  Vital Signs  Vitals:   06/05/21 0353 06/05/21 0500 06/05/21 0728 06/05/21 0823  BP: 128/64  (!) 142/67 (!) 166/54  Pulse: 92  100 (!) 102  Resp: (!) 25  20   Temp:   98 F (36.7 C)   TempSrc:   Oral   SpO2: 95%  93%   Weight:  80.5 kg      Intake/Output Summary (Last 24 hours) at 06/05/2021 1052 Last data filed at 06/05/2021 0900 Gross per 24 hour  Intake 120 ml  Output 300 ml  Net -180 ml   Filed Weights   06/03/21 0500 06/04/21 0500 06/05/21 0500  Weight: 80 kg 79.9 kg 80.5 kg    Awake Alert, in no apparent distress, pleasant Symmetrical Chest wall movement, diminished air entry at the bases, no wheezing or rhonchi irregular irregular irregular,No Gallops,Rubs or new Murmurs, No Parasternal Heave +ve B.Sounds, Abd Soft, No tenderness, No rebound - guarding or rigidity.PEG present, with no oozing or discharge at site. No Cyanosis, Clubbing or edema, No new Rash or bruise     Lab Results:  Data Reviewed: I have personally reviewed following labs and imaging studies  CBC: Recent Labs  Lab 05/30/21 0341 06/01/21 0232 06/03/21 0351 06/04/21 0344  WBC 5.5 5.6 7.9 6.5  HGB 9.1* 9.3* 10.3* 10.0*  HCT 29.7* 31.0* 34.0* 32.8*  MCV 95.8 96.3 96.0 95.1  PLT 356 359 316 956    Basic Metabolic Panel: Recent Labs  Lab 05/30/21 0341 06/01/21 0232 06/03/21 0351  NA 133* 135 137  K 4.4 4.2 4.3  CL 100 97* 99  CO2 29 32 29  GLUCOSE 203* 245* 96  BUN 21 23 23   CREATININE 0.71 0.89 0.79  CALCIUM 7.7* 8.1* 8.2*  MG  --  2.0 2.0  PHOS  --  4.0  --     GFR: Estimated Creatinine Clearance: 82.2 mL/min (by C-G formula based on SCr of 0.79 mg/dL).   CBG: Recent Labs  Lab 06/04/21 2031 06/04/21 2214 06/05/21 0024 06/05/21 0353 06/05/21 0744  GLUCAP 100* 96 108* 101* 106*     Radiology Studies: IR GASTROSTOMY TUBE MOD SED  Result Date: 06/04/2021 INDICATION: Dysphagia Nutritional  support EXAM: Ultrasound and fluoroscopy guided percutaneous gastrostomy tube placement MEDICATIONS: Ancef 2 gm IV; Antibiotics were administered within 1 hour of the procedure. Glucagon 1 mg IV ANESTHESIA/SEDATION: Moderate (conscious) sedation was employed during this procedure. A total of Versed 2 mg and Fentanyl 75 mcg was administered intravenously by the radiology nurse. Total intra-service moderate Sedation Time: 16 minutes. The patient's level of consciousness and vital signs were monitored continuously by radiology nursing throughout the procedure under my direct supervision. CONTRAST:  20 mL Omnipaque 300-administered into the gastric lumen. FLUOROSCOPY TIME:  Fluoroscopy Time: 4.8 minutes (38 mGy). COMPLICATIONS: None immediate. PROCEDURE: Informed written consent was obtained from the patient after a thorough discussion of the procedural risks, benefits and alternatives. All questions were addressed. Maximal Sterile Barrier Technique was utilized including caps, mask, sterile gowns, sterile gloves, sterile drape, hand hygiene and skin antiseptic. A timeout was performed prior to the initiation of the procedure. An orogastric tube was placed with fluoroscopic guidance. The anterior abdomen was prepped and draped in sterile fashion. Ultrasound evaluation of the left upper quadrant was performed to confirm the position of the liver. The skin and subcutaneous tissues were anesthetized with 1% lidocaine. Single gastropexy was inserted  using fluoroscopic guidance. 17 gauge needle was directed into the distended stomach with fluoroscopic guidance. A wire was advanced into the stomach. 9-French vascular sheath was placed and the orogastric tube was snared using a Gooseneck snare device. The orogastric tube and snare were pulled out of the patient's mouth. The snare device was connected to a 20-French gastrostomy tube. The snare device and gastrostomy tube were pulled through the patient's mouth and out the  anterior abdominal wall. The gastrostomy tube was cut to an appropriate length. Contrast injection through gastrostomy tube confirmed placement within the stomach. Fluoroscopic images were obtained for documentation. The gastrostomy tube was flushed with normal saline. IMPRESSION: 20 French percutaneous gastrostomy tube placement as above. Electronically Signed   By: Miachel Roux M.D.   On: 06/04/2021 09:32       LOS: 35 days   Ronald [name]  [facility] Pager on [email or web address]  06/05/2021, 10:52 AM

## 2021-06-05 NOTE — Plan of Care (Signed)

## 2021-06-06 DIAGNOSIS — I4892 Unspecified atrial flutter: Secondary | ICD-10-CM | POA: Diagnosis not present

## 2021-06-06 DIAGNOSIS — J9602 Acute respiratory failure with hypercapnia: Secondary | ICD-10-CM | POA: Diagnosis not present

## 2021-06-06 DIAGNOSIS — I2510 Atherosclerotic heart disease of native coronary artery without angina pectoris: Secondary | ICD-10-CM | POA: Diagnosis not present

## 2021-06-06 DIAGNOSIS — J449 Chronic obstructive pulmonary disease, unspecified: Secondary | ICD-10-CM | POA: Diagnosis not present

## 2021-06-06 DIAGNOSIS — K922 Gastrointestinal hemorrhage, unspecified: Secondary | ICD-10-CM | POA: Diagnosis not present

## 2021-06-06 DIAGNOSIS — G9341 Metabolic encephalopathy: Secondary | ICD-10-CM | POA: Diagnosis not present

## 2021-06-06 DIAGNOSIS — I10 Essential (primary) hypertension: Secondary | ICD-10-CM | POA: Diagnosis not present

## 2021-06-06 DIAGNOSIS — E119 Type 2 diabetes mellitus without complications: Secondary | ICD-10-CM | POA: Diagnosis not present

## 2021-06-06 DIAGNOSIS — R778 Other specified abnormalities of plasma proteins: Secondary | ICD-10-CM | POA: Diagnosis not present

## 2021-06-06 DIAGNOSIS — E440 Moderate protein-calorie malnutrition: Secondary | ICD-10-CM | POA: Diagnosis not present

## 2021-06-06 DIAGNOSIS — R69 Illness, unspecified: Secondary | ICD-10-CM | POA: Diagnosis not present

## 2021-06-06 DIAGNOSIS — J441 Chronic obstructive pulmonary disease with (acute) exacerbation: Secondary | ICD-10-CM | POA: Diagnosis not present

## 2021-06-06 DIAGNOSIS — E559 Vitamin D deficiency, unspecified: Secondary | ICD-10-CM | POA: Diagnosis not present

## 2021-06-06 DIAGNOSIS — R531 Weakness: Secondary | ICD-10-CM | POA: Diagnosis not present

## 2021-06-06 DIAGNOSIS — J398 Other specified diseases of upper respiratory tract: Secondary | ICD-10-CM | POA: Diagnosis not present

## 2021-06-06 DIAGNOSIS — Z7401 Bed confinement status: Secondary | ICD-10-CM | POA: Diagnosis not present

## 2021-06-06 DIAGNOSIS — J9621 Acute and chronic respiratory failure with hypoxia: Secondary | ICD-10-CM | POA: Diagnosis not present

## 2021-06-06 LAB — BASIC METABOLIC PANEL
Anion gap: 7 (ref 5–15)
BUN: 24 mg/dL — ABNORMAL HIGH (ref 8–23)
CO2: 32 mmol/L (ref 22–32)
Calcium: 8.3 mg/dL — ABNORMAL LOW (ref 8.9–10.3)
Chloride: 93 mmol/L — ABNORMAL LOW (ref 98–111)
Creatinine, Ser: 0.85 mg/dL (ref 0.61–1.24)
GFR, Estimated: >60 mL/min (ref >60)
Glucose, Bld: 211 mg/dL — ABNORMAL HIGH (ref 70–99)
Potassium: 3.7 mmol/L (ref 3.5–5.1)
Sodium: 132 mmol/L — ABNORMAL LOW (ref 135–145)

## 2021-06-06 LAB — CBC
HCT: 34.2 % — ABNORMAL LOW (ref 39.0–52.0)
Hemoglobin: 10.4 g/dL — ABNORMAL LOW (ref 13.0–17.0)
MCH: 28.9 pg (ref 26.0–34.0)
MCHC: 30.4 g/dL (ref 30.0–36.0)
MCV: 95 fL (ref 80.0–100.0)
Platelets: 382 10*3/uL (ref 150–400)
RBC: 3.6 MIL/uL — ABNORMAL LOW (ref 4.22–5.81)
RDW: 16.6 % — ABNORMAL HIGH (ref 11.5–15.5)
WBC: 9 10*3/uL (ref 4.0–10.5)
nRBC: 0 % (ref 0.0–0.2)

## 2021-06-06 LAB — GLUCOSE, CAPILLARY
Glucose-Capillary: 173 mg/dL — ABNORMAL HIGH (ref 70–99)
Glucose-Capillary: 177 mg/dL — ABNORMAL HIGH (ref 70–99)
Glucose-Capillary: 193 mg/dL — ABNORMAL HIGH (ref 70–99)
Glucose-Capillary: 214 mg/dL — ABNORMAL HIGH (ref 70–99)

## 2021-06-06 MED ORDER — BUDESONIDE 0.5 MG/2ML IN SUSP: 0.5000 mg | Freq: Two times a day (BID) | RESPIRATORY_TRACT | 12 refills | Status: DC

## 2021-06-06 MED ORDER — OSMOLITE 1.5 CAL PO LIQD: 900.0000 mL | ORAL | 0 refills | Status: DC

## 2021-06-06 MED ORDER — FREE WATER: 30.0000 mL | Status: DC

## 2021-06-06 MED ORDER — QUETIAPINE FUMARATE 100 MG PO TABS: 50.0000 mg | ORAL_TABLET | Freq: Every day | ORAL | Status: DC

## 2021-06-06 MED ORDER — ALPRAZOLAM 0.5 MG PO TABS: 0.5000 mg | ORAL_TABLET | Freq: Two times a day (BID) | ORAL | 0 refills | Status: DC | PRN

## 2021-06-06 MED ORDER — OSMOLITE 1.5 CAL PO LIQD
900.0000 mL | ORAL | Status: DC
Start: 2021-06-06 — End: 2021-06-06
  Administered 2021-06-06: 11:00:00 900 mL

## 2021-06-06 MED ORDER — ACETAMINOPHEN 325 MG PO TABS: 650.0000 mg | ORAL_TABLET | ORAL | Status: DC | PRN

## 2021-06-06 MED ORDER — FUROSEMIDE 20 MG PO TABS: 20.0000 mg | ORAL_TABLET | Freq: Every day | ORAL | 2 refills | Status: DC

## 2021-06-06 MED ORDER — INSULIN ASPART 100 UNIT/ML IJ SOLN: 0.0000 [IU] | Freq: Three times a day (TID) | INTRAMUSCULAR | 11 refills | Status: DC

## 2021-06-06 MED ORDER — AMIODARONE HCL 100 MG PO TABS: 100.0000 mg | ORAL_TABLET | Freq: Every day | ORAL | Status: DC

## 2021-06-06 MED ORDER — INSULIN GLARGINE-YFGN 100 UNIT/ML ~~LOC~~ SOLN: 15.0000 [IU] | Freq: Two times a day (BID) | SUBCUTANEOUS | 11 refills | Status: DC

## 2021-06-06 MED ORDER — ISOSORBIDE DINITRATE 10 MG PO TABS: 10.0000 mg | ORAL_TABLET | Freq: Two times a day (BID) | ORAL | Status: DC

## 2021-06-06 MED ORDER — ARFORMOTEROL TARTRATE 15 MCG/2ML IN NEBU: 15.0000 ug | INHALATION_SOLUTION | Freq: Two times a day (BID) | RESPIRATORY_TRACT | Status: DC

## 2021-06-06 MED ORDER — PROSOURCE TF PO LIQD: 45.0000 mL | Freq: Three times a day (TID) | ORAL | Status: DC

## 2021-06-06 MED ORDER — IPRATROPIUM BROMIDE 0.02 % IN SOLN: 0.5000 mg | Freq: Four times a day (QID) | RESPIRATORY_TRACT | 12 refills | Status: DC | PRN

## 2021-06-06 MED ORDER — PANTOPRAZOLE SODIUM 40 MG PO PACK: 40.0000 mg | PACK | Freq: Every day | ORAL | Status: DC

## 2021-06-06 MED ORDER — CARVEDILOL 12.5 MG PO TABS: 37.5000 mg | ORAL_TABLET | Freq: Two times a day (BID) | ORAL | Status: DC

## 2021-06-06 MED ORDER — APIXABAN 2.5 MG PO TABS: 2.5000 mg | ORAL_TABLET | Freq: Two times a day (BID) | ORAL | Status: DC

## 2021-06-06 NOTE — Progress Notes (Signed)
Report given to [place] at [facility]

## 2021-06-06 NOTE — Discharge Instructions (Addendum)
Follow with Primary MD SNF physician  Get CBC, CMP,   Activity: As tolerated with Full fall precautions use walker/cane & assistance as needed   Disposition SNF   Diet: Dysphagia 2 with thin liquids -He is on nocturnal tube feed due to poor oral intake , meds can be given via PEG as well.  On your next visit with your primary care physician please Get Medicines reviewed and adjusted.   Please request your Prim.MD to go over all Hospital Tests and Procedure/Radiological results at the follow up, please get all Hospital records sent to your Prim MD by signing hospital release before you go home.   If you experience worsening of your admission symptoms, develop shortness of breath, life threatening emergency, suicidal or homicidal thoughts you must seek medical attention immediately by calling 911 or calling your MD immediately  if symptoms less severe.  You Must read complete instructions/literature along with all the possible adverse reactions/side effects for all the Medicines you take and that have been prescribed to you. Take any new Medicines after you have completely understood and accpet all the possible adverse reactions/side effects.   Do not drive, operating heavy machinery, perform activities at heights, swimming or participation in water activities or provide baby sitting services if your were admitted for syncope or siezures until you have seen by Primary MD or a Neurologist and advised to do so again.  Do not drive when taking Pain medications.    Do not take more than prescribed Pain, Sleep and Anxiety Medications  Special Instructions: If you have smoked or chewed Tobacco  in the last 2 yrs please stop smoking, stop any regular Alcohol  and or any Recreational drug use.  Wear Seat belts while driving.   Please note  You were cared for by a hospitalist during your hospital stay. If you have any questions about your discharge medications or the care you received  while you were in the hospital after you are discharged, you can call the unit and asked to speak with the hospitalist on call if the hospitalist that took care of you is not available. Once you are discharged, your primary care physician will handle any further medical issues. Please note that NO REFILLS for any discharge medications will be authorized once you are discharged, as it is imperative that you return to your primary care physician (or establish a relationship with a primary care physician if you do not have one) for your aftercare needs so that they can reassess your need for medications and monitor your lab values.   Information on my medicine - ELIQUIS (apixaban)  This medication education was reviewed with me or my healthcare representative as part of my discharge preparation.  The pharmacist that spoke with me during my hospital stay was:    Why was Eliquis prescribed for you? Eliquis was prescribed for you to reduce the risk of a blood clot forming that can cause a stroke if you have a medical condition called atrial fibrillation (a type of irregular heartbeat).  What do You need to know about Eliquis ? Take your Eliquis TWICE DAILY - one tablet in the morning and one tablet in the evening with or without food. If you have difficulty swallowing the tablet whole please discuss with your pharmacist how to take the medication safely.  Take Eliquis exactly as prescribed by your doctor and DO NOT stop taking Eliquis without talking to the doctor who prescribed the medication.  Stopping may increase your  risk of developing a stroke.  Refill your prescription before you run out.  After discharge, you should have regular check-up appointments with your healthcare provider that is prescribing your Eliquis.  In the future your dose may need to be changed if your kidney function or weight changes by a significant amount or as you get older.  What do you do if you miss a dose? If you  miss a dose, take it as soon as you remember on the same day and resume taking twice daily.  Do not take more than one dose of ELIQUIS at the same time to make up a missed dose.  Important Safety Information A possible side effect of Eliquis is bleeding. You should call your healthcare provider right away if you experience any of the following: Bleeding from an injury or your nose that does not stop. Unusual colored urine (red or dark brown) or unusual colored stools (red or black). Unusual bruising for unknown reasons. A serious fall or if you hit your head (even if there is no bleeding).  Some medicines may interact with Eliquis and might increase your risk of bleeding or clotting while on Eliquis. To help avoid this, consult your healthcare provider or pharmacist prior to using any new prescription or non-prescription medications, including herbals, vitamins, non-steroidal anti-inflammatory drugs (NSAIDs) and supplements.  This website has more information on Eliquis (apixaban): http://www.eliquis.com/eliquis/home

## 2021-06-06 NOTE — TOC Transition Note (Signed)
Transition of Care [facility]) - CM/SW Discharge Note   Patient Details  Name: Ronald [name] MRN: [id number] Date of Birth: 12-08-1945  [facility] CM/SW Contact:  Joanne Chars, LCSW Phone Number: 06/06/2021, 12:25 PM   Clinical Narrative:   Pt discharging to [facility]  RN call [phone] for report.      Final next level of care: Skilled Nursing Facility Barriers to Discharge: Barriers Resolved   Patient Goals and CMS Choice        Discharge Placement              Patient chooses bed at:  [facility]) Patient to be transferred to facility by: [place] Name of family member notified: son Ronald [name] Patient and family notified of of transfer: 06/06/21  Discharge Plan and Services   Discharge Planning Services: CM Consult                                 Social Determinants of Health (SDOH) Interventions     Readmission Risk Interventions Readmission Risk Prevention Plan 09/28/2020  Transportation Screening Complete  HRI or Home Care Consult Complete  Social Work Consult for [place] Planning/Counseling Complete  Palliative Care Screening Not Applicable  Medication Review [employer]) Complete  Some recent data might be hidden

## 2021-06-06 NOTE — Discharge Summary (Addendum)
Physician Discharge Summary  Ronald [name]:[id number] DOB: 18-Dec-1945 DOA: 05/01/2021  PCP: [name], FNP  Admit date: 05/01/2021 Discharge date: 06/06/2021  Admitted From: Home Disposition:  SNF  Recommendations for Outpatient Follow-up:  Please check  CBC, BMP in 3 days Please increase Eliquis to 5 mg twice daily in 5 days if CBC remains stable with no evidence of recurrent GI bleed Patient on dysphagia 2, thin liquid diet, but oral intake is poor, for which he is on nocturnal tube feed.   Discharge Condition:Stable CODE STATUS:FULL Diet recommendation:  Dysphagia 2 with thin liquid  Brief/Interim Summary:  Ronald [name] is a 75 year old male with past medical history of COPD, Gold stage III-IV, tracheal stenosis s/p trach in 2018, CAD s/p DES x2 to LAD in 2015, hypertension, hyperlipidemia, chronic respiratory failure on 2 L, HFpEF, diabetes mellitus type 2 presented with shortness of breath.  He was started on steroids and DuoNebs by EMS, placed on CPAP without improvement.  Patient was intubated for airway protection.  Was initially admitted to intensive care unit. Patient had 2 episodes of bright red blood per rectum.  GI was consulted. They evaluated the patient, but felt that colonoscopy in the setting of the patient's active cardiopulmonary comorbidities presented risks due to the need for anesthesia that were best to be avoided.  - Continue to have very poor oral intake requiring core track feeding tube.  Despite encouragement patient's oral intake did not improve.  PEG tube was ordered and placed 12/27.  Plan is to go to SNF .  Lower GI bleed/acute blood loss anemia -Patient had 2 large bloody bowel movements 05/16/21 -Colonoscopy earlier this year showed diverticulosis and internal hemorrhoids - Was also seen by gastroenterology who recommended monitoring and consider doing CT angiogram if he has recurrence of bleeding.  But there was no evidence of recurrent GI  bleeding. -She has been resumed on low-dose Eliquis, continue to monitor H&H closely, and monitor for any signs of bleeding.     Poor PO intake Patient remains on tube feedings.  Oral intake remains very poor.   Looks like he will need a PEG tube going forward.  Discussed with patient and he was agreeable.  This was also discussed with patient's son and daughter-in-law.  They have discussed with patient and would like to proceed with plans for PEG tube. Interventional radiology was consulted. -Tube inserted 12/27, has been using it for meds overnight, will start him on tube feed today, eventual plan for the patient is to continue tube feed at nighttime only, but will initiate today during daytime to monitor for next 24 hours.   Acute on chronic hypoxemic and hypercapnic respiratory failure -Secondary to COPD exacerbation; history of tracheal stenosis -Completed steroid taper -Patient supposed to be on CPAP, but he is noncompliant with it -Continue bronchodilators, Pulmicort, Brovana, DuoNeb - encourage incentive spirometer use    Right leg pain Patient complains of pain in the right thigh and hip area.  He underwent hip x-ray on 12/9 which did not show any injury.  X-ray of the knee and femur were done on 12/21 which did not show any acute findings.   CT scan of the abdomen done on 12/22 to evaluate anatomy before PEG tube placement did suggest concern for retroperitoneal hematoma based on the appearance of the right iliacus musculature.  This seemed to correspond to the bruising noted in his posterior thigh on the right.  Could have had a muscular strain.  Hemoglobin has  been stable.  He has been hemodynamically stable.  This likely occurred several days ago.  Do not feel that he needs any further testing at this time.     Atrial flutter with RVR Occasional elevation in heart rate is noted.  He is on amiodarone and carvedilol which is being continued. -Eliquis held due to significant drop in  hemoglobin from GI bleed.  Risks of holding anticoagulation was discussed with patient's family. -After PEG was placed, and stable hemoglobin, he was started on low-dose Eliquis 2.5 mg p.o. twice daily, hemoglobin has been stable, no evidence of recurrent GI bleed, his Eliquis can be resumed at full dose 5 mg p.o. twice daily in 5 days as long no evidence of GI bleed.   Diabetes mellitus type 2 -Last A1c 7.5% on 05/02/2021 Remains on glargine insulin and SSI.   Left-sided renal lesion Incidentally noted on CT scan.  Renal ultrasound only showed simple cyst.  No further work-up at this time.   Acute kidney injury Creatinine had gone up to 1.32 on 05/17/21.  Resolved with IV fluids.    Acute metabolic encephalopathy -Resolved -Continue Seroquel, Xanax as needed   Dysphagia Patient is on DYS 2 diet with thin liquids.  Very poor oral intake is noted as discussed above.  PEG tube placed on 12/27.   Hypotension Hypotension appears to have resolved.   Moderate protein calorie malnutrition Nutrition Problem: Moderate Malnutrition Etiology: acute illness (influenza A)    Discharge Diagnoses:  Principal Problem:   Acute hypercapnic respiratory failure (HCC) Active Problems:   Coronary artery disease   COPD exacerbation (HCC)   Tracheal stenosis   Elevated troponin   Atrial flutter (HCC)   Shock circulatory (HCC)   Influenza A   Malnutrition of moderate degree    Discharge Instructions  Discharge Instructions     Diet - low sodium heart healthy   Complete by: As directed    Discharge instructions   Complete by: As directed    Follow with Primary MD SNF physician  Get CBC, CMP,   Activity: As tolerated with Full fall precautions use walker/cane & assistance as needed   Disposition SNF   Diet: Dysphagia 2 with thin liquids -He is on nocturnal tube feed due to poor oral intake , meds can be given via PEG as well.  On your next visit with your primary care physician  please Get Medicines reviewed and adjusted.   Please request your Prim.MD to go over all Hospital Tests and Procedure/Radiological results at the follow up, please get all Hospital records sent to your Prim MD by signing hospital release before you go home.   If you experience worsening of your admission symptoms, develop shortness of breath, life threatening emergency, suicidal or homicidal thoughts you must seek medical attention immediately by calling 911 or calling your MD immediately  if symptoms less severe.  You Must read complete instructions/literature along with all the possible adverse reactions/side effects for all the Medicines you take and that have been prescribed to you. Take any new Medicines after you have completely understood and accpet all the possible adverse reactions/side effects.   Do not drive, operating heavy machinery, perform activities at heights, swimming or participation in water activities or provide baby sitting services if your were admitted for syncope or siezures until you have seen by Primary MD or a Neurologist and advised to do so again.  Do not drive when taking Pain medications.    Do not take more than  prescribed Pain, Sleep and Anxiety Medications  Special Instructions: If you have smoked or chewed Tobacco  in the last 2 yrs please stop smoking, stop any regular Alcohol  and or any Recreational drug use.  Wear Seat belts while driving.   Please note  You were cared for by a hospitalist during your hospital stay. If you have any questions about your discharge medications or the care you received while you were in the hospital after you are discharged, you can call the unit and asked to speak with the hospitalist on call if the hospitalist that took care of you is not available. Once you are discharged, your primary care physician will handle any further medical issues. Please note that NO REFILLS for any discharge medications will be authorized once  you are discharged, as it is imperative that you return to your primary care physician (or establish a relationship with a primary care physician if you do not have one) for your aftercare needs so that they can reassess your need for medications and monitor your lab values.   Increase activity slowly   Complete by: As directed    No wound care   Complete by: As directed       Allergies as of 06/06/2021       Reactions   Gabapentin Anxiety   Unknown reaction   Metformin And Related Rash        Medication List     STOP taking these medications    amLODipine 5 MG tablet Commonly known as: NORVASC   aspirin EC 81 MG tablet   fluticasone 50 MCG/ACT nasal spray Commonly known as: FLONASE   Gemtesa 75 MG Tabs Generic drug: Vibegron   ibuprofen 200 MG tablet Commonly known as: ADVIL   ipratropium-albuterol 0.5-2.5 (3) MG/3ML Soln Commonly known as: DUONEB   isosorbide mononitrate 30 MG 24 hr tablet Commonly known as: IMDUR   losartan 100 MG tablet Commonly known as: COZAAR   metoprolol tartrate 50 MG tablet Commonly known as: LOPRESSOR   mirtazapine 15 MG tablet Commonly known as: REMERON   pantoprazole 40 MG tablet Commonly known as: PROTONIX Replaced by: pantoprazole sodium 40 mg   predniSONE 10 MG tablet Commonly known as: DELTASONE   REFENESEN 400 PO       TAKE these medications    Accu-Chek Guide Me w/Device Kit Check BS daily Dx E11.9   acetaminophen 325 MG tablet Commonly known as: TYLENOL Place 2 tablets (650 mg total) into feeding tube every 4 (four) hours as needed for mild pain (temp > 101.5).   albuterol 108 (90 Base) MCG/ACT inhaler Commonly known as: VENTOLIN HFA INHALE 2 PUFFS EVERY 6 HOURS AS NEEEDED What changed: See the new instructions.   ALPRAZolam 0.5 MG tablet Commonly known as: XANAX Take 1 tablet (0.5 mg total) by mouth 2 (two) times daily as needed. for anxiety   amiodarone 100 MG tablet Commonly known as:  PACERONE Place 1 tablet (100 mg total) into feeding tube daily. Start taking on: June 07, 2021   apixaban 2.5 MG Tabs tablet Commonly known as: ELIQUIS Place 1 tablet (2.5 mg total) into feeding tube 2 (two) times daily.   arformoterol 15 MCG/2ML Nebu Commonly known as: BROVANA Take 2 mLs (15 mcg total) by nebulization 2 (two) times daily.   atorvastatin 40 MG tablet Commonly known as: LIPITOR TAKE 1 TABLET BY MOUTH ONCE DAILY IN THE EVENING   budesonide 0.5 MG/2ML nebulizer solution Commonly known as: PULMICORT Take 2  mLs (0.5 mg total) by nebulization 2 (two) times daily.   carvedilol 12.5 MG tablet Commonly known as: COREG Place 3 tablets (37.5 mg total) into feeding tube 2 (two) times daily with a meal.   cholecalciferol 25 MCG (1000 UNIT) tablet Commonly known as: VITAMIN D3 Take 1,000 Units by mouth daily.   escitalopram 20 MG tablet Commonly known as: LEXAPRO Take 1 tablet (20 mg total) by mouth daily.   feeding supplement (PROSource TF) liquid Place 45 mLs into feeding tube 3 (three) times daily.   feeding supplement (OSMOLITE 1.5 CAL) Liqd Place 900 mLs into feeding tube daily. May substitute with Osmolite 1.2 if it is the available formulary at the facility   free water Soln Place 30 mLs into feeding tube every 4 (four) hours.   furosemide 20 MG tablet Commonly known as: LASIX Take 1 tablet (20 mg total) by mouth daily. What changed: when to take this   glucose blood test strip Commonly known as: ONE TOUCH ULTRA TEST USE TO CHECK GLUCOSE ONCE DAILY   insulin aspart 100 UNIT/ML injection Commonly known as: novoLOG Inject 0-9 Units into the skin 3 (three) times daily with meals.   insulin glargine-yfgn 100 UNIT/ML injection Commonly known as: SEMGLEE Inject 0.15 mLs (15 Units total) into the skin 2 (two) times daily.   ipratropium 0.02 % nebulizer solution Commonly known as: ATROVENT Take 2.5 mLs (0.5 mg total) by nebulization every 6 (six)  hours as needed for wheezing or shortness of breath.   isosorbide dinitrate 10 MG tablet Commonly known as: ISORDIL Place 1 tablet (10 mg total) into feeding tube 2 (two) times daily.   nitroGLYCERIN 0.4 MG SL tablet Commonly known as: NITROSTAT Place 0.4 mg under the tongue every 5 (five) minutes as needed for chest pain.   onetouch ultrasoft lancets Use as instructed   DX E11.9   OXYGEN Inhale 3 L into the lungs at bedtime.   pantoprazole sodium 40 mg Commonly known as: PROTONIX Place 40 mg into feeding tube daily at 6 PM. Replaces: pantoprazole 40 MG tablet   QUEtiapine 100 MG tablet Commonly known as: SEROQUEL Place 0.5 tablets (50 mg total) into feeding tube at bedtime.   vitamin C 1000 MG tablet Take 1,000 mg by mouth daily.        Contact information for after-discharge care     [facility] .   Service: Skilled Nursing Contact information: [address] [address] [phone]                    Allergies  Allergen Reactions   Gabapentin Anxiety    Unknown reaction   Metformin And Related Rash    Consultations: Critical care medicine Gastroenterology Interventional radiology     Procedures/Studies: CT ABDOMEN WO CONTRAST  Result Date: 05/30/2021 CLINICAL DATA:  Evaluate anatomy prior to potential percutaneous gastrostomy tube placement. EXAM: CT ABDOMEN WITHOUT CONTRAST TECHNIQUE: Multidetector CT imaging of the abdomen was performed following the standard protocol without IV contrast. COMPARISON:  Chest CT-12/21/2020; 08/27/2018 FINDINGS: The lack of intravenous contrast limits the ability to evaluate solid abdominal organs. Lower chest: Limited visualization of the lower thorax demonstrates left basilar consolidative opacities and volume loss with associated air bronchograms. Trace bilateral pleural effusions. Borderline cardiomegaly. Trace amount of  pericardial fluid, similar to the 08/27/2018 examination Hepatobiliary: Normal hepatic contour. Post cholecystectomy. No ascites. Pancreas: The pancreas is partially fatty replaced.  Spleen: Normal noncontrast appearance of the spleen. Adrenals/Urinary Tract: Subcentimeter hypoattenuating renal lesions are seen bilaterally. There is an approximately 1.7 cm apparent isoattenuating lesion involving the posteroinferior aspect the left kidney (image 33, series 3. There is an approximately 1.9 cm partially exophytic hypoattenuating lesion arising from the posterosuperior aspect the right kidney (image 24, series 3), which is incompletely characterized without intravenous contrast though favored to represent a renal cyst. Additional subcentimeter hypoattenuating lesions are too small to adequately characterize though also favored to represent additional renal cysts. Calcifications about the bilateral renal hila are favored to be vascular in etiology. No evidence of nephrolithiasis. There is a minimal amount of grossly symmetric likely age and body habitus related perinephric stranding. No urinary obstruction. Redemonstrated macroscopic fat containing approximately 1.6 cm right-sided adrenal myelolipoma as well as an approximately 0.9 cm nodule within the crux of the left adrenal gland (image 18, series 3), which is too small to accurately characterize though similar to the 08/27/2018 examination and also favored to represent an adrenal adenoma. The urinary bladder was not imaged. Stomach/Bowel: There is slight interposition of the hepatic parenchyma and transverse colon between the anterior wall of the stomach and ventral wall of the abdomen, however the percutaneous window is felt to likely be improved with gastric insufflation. Enteric tube tip terminates within the ascending portion of the duodenum. No evidence of enteric obstruction. No pneumoperitoneum, pneumatosis or portal venous gas. Vascular/Lymphatic: Large  amount of atherosclerotic plaque within normal caliber abdominal aorta. Post percutaneous coil embolization of the GDA. Other: Apparent asymmetry involving the superior most aspect of the right iliacus musculature (image 48, series 3), incompletely imaged though potentially representative of a retroperitoneal hematoma. Musculoskeletal: No acute or aggressive osseous abnormalities. Mild multilevel lumbar spine DDD, worse at L2-L3 with disc space height loss, endplate irregularity and sclerosis. IMPRESSION: 1. Gastric anatomy felt to be amenable to attempted percutaneous gastrostomy tube placement with improved percutaneous window with gastric insufflation. 2. Apparent asymmetry involving the superior most aspect of the right iliacus musculature, incompletely imaged, though potentially representative of a retroperitoneal hematoma. Clinical correlation is advised. Further evaluation with completion noncontrast CT of the pelvis could be performed as indicated. 3.  Aortic Atherosclerosis (ICD10-I70.0). 4. Left lower lobe volume loss, consolidative opacities and air bronchograms, atelectasis versus infiltrate. 5. Suspected 1.7 cm isoattenuating left-sided renal lesion, potentially a hyperdense renal cyst though conceivably a renal cell carcinoma could have a similar appearance. Correlation with prior outside examinations (if available), is advised. Further evaluation with renal ultrasound could be performed as indicated. Electronically Signed   By: Sandi Mariscal M.D.   On: 05/30/2021 12:31   IR GASTROSTOMY TUBE MOD SED  Result Date: 06/04/2021 INDICATION: Dysphagia Nutritional support EXAM: Ultrasound and fluoroscopy guided percutaneous gastrostomy tube placement MEDICATIONS: Ancef 2 gm IV; Antibiotics were administered within 1 hour of the procedure. Glucagon 1 mg IV ANESTHESIA/SEDATION: Moderate (conscious) sedation was employed during this procedure. A total of Versed 2 mg and Fentanyl 75 mcg was administered  intravenously by the radiology nurse. Total intra-service moderate Sedation Time: 16 minutes. The patient's level of consciousness and vital signs were monitored continuously by radiology nursing throughout the procedure under my direct supervision. CONTRAST:  20 mL Omnipaque 300-administered into the gastric lumen. FLUOROSCOPY TIME:  Fluoroscopy Time: 4.8 minutes (38 mGy). COMPLICATIONS: None immediate. PROCEDURE: Informed written consent was obtained from the patient after a thorough discussion of the procedural risks, benefits and alternatives. All questions were addressed. Maximal Sterile Barrier Technique was utilized including  caps, mask, sterile gowns, sterile gloves, sterile drape, hand hygiene and skin antiseptic. A timeout was performed prior to the initiation of the procedure. An orogastric tube was placed with fluoroscopic guidance. The anterior abdomen was prepped and draped in sterile fashion. Ultrasound evaluation of the left upper quadrant was performed to confirm the position of the liver. The skin and subcutaneous tissues were anesthetized with 1% lidocaine. Single gastropexy was inserted using fluoroscopic guidance. 17 gauge needle was directed into the distended stomach with fluoroscopic guidance. A wire was advanced into the stomach. 9-French vascular sheath was placed and the orogastric tube was snared using a Gooseneck snare device. The orogastric tube and snare were pulled out of the patient's mouth. The snare device was connected to a 20-French gastrostomy tube. The snare device and gastrostomy tube were pulled through the patient's mouth and out the anterior abdominal wall. The gastrostomy tube was cut to an appropriate length. Contrast injection through gastrostomy tube confirmed placement within the stomach. Fluoroscopic images were obtained for documentation. The gastrostomy tube was flushed with normal saline. IMPRESSION: 20 French percutaneous gastrostomy tube placement as above.  Electronically Signed   By: Miachel Roux M.D.   On: 06/04/2021 09:32   US RENAL  Result Date: 06/01/2021 CLINICAL DATA:  Indeterminate renal lesions by noncontrast CT EXAM: RENAL / URINARY TRACT ULTRASOUND COMPLETE COMPARISON:  05/30/2021 FINDINGS: Right Kidney: Renal measurements: 10.8 x 5.0 x 5.4 cm = volume: 150 mL. Normal echogenicity. No hydronephrosis. Scattered cortical anechoic simple appearing renal cysts, largest in the midpole measures 2.4 x 1.7 x 1.9 cm. Left Kidney: Renal measurements: 9.6 x 6.2 x 5.1 cm = volume: 158 mL. Normal echogenicity. No hydronephrosis. Anechoic cortical renal cysts also noted, largest in the mid to lower pole measures 1.3 x 1.2 x 1.5 cm. Additional upper pole cyst medially measures 1.4 cm. Bladder: Appears normal for degree of bladder distention. Other: Enlarged prostate measuring 4.2 x 3.5 x 3.6 cm, 28 cc volume IMPRESSION: Bilateral simple appearing renal cortical cysts. No other acute finding or hydronephrosis. Prostate enlargement Electronically Signed   By: Jerilynn Mages.  Shick M.D.   On: 06/01/2021 11:34   DG CHEST PORT 1 VIEW  Result Date: 05/31/2021 CLINICAL DATA:  Dyspnea EXAM: PORTABLE CHEST 1 VIEW COMPARISON:  05/22/2021 FINDINGS: Enteric tube remains present. Persistent patchy opacities, greater on the left. No significant pleural effusion. Right costophrenic angle is partially excluded. Stable cardiomediastinal contours. IMPRESSION: Persistent patchy opacities may reflect pneumonia. Electronically Signed   By: Macy Mis M.D.   On: 05/31/2021 10:06   DG CHEST PORT 1 VIEW  Result Date: 05/22/2021 CLINICAL DATA:  Short of breath, hypoxia EXAM: PORTABLE CHEST 1 VIEW COMPARISON:  05/16/2021 FINDINGS: Single frontal view of the chest demonstrates enteric catheter passing below diaphragm tip excluded by collimation. The cardiac silhouette is stable. There is increasing consolidation at the left lung base. No effusion or pneumothorax. No acute bony abnormalities.  IMPRESSION: 1. Progressive left basilar consolidation consistent with pneumonia or aspiration. Electronically Signed   By: Randa Ngo M.D.   On: 05/22/2021 20:09   DG CHEST PORT 1 VIEW  Result Date: 05/16/2021 CLINICAL DATA:  Respiratory distress EXAM: PORTABLE CHEST 1 VIEW COMPARISON:  11:08 a.m. FINDINGS: The lungs are symmetrically hyperinflated in keeping with changes of underlying COPD. Focal consolidations again seen within the retrocardiac region possibly reflecting lobar pneumonia or aspiration in the acute setting. Cardiac size is mildly enlarged, unchanged. Pulmonary vascularity is normal. No pneumothorax or pleural effusion. Nasoenteric feeding  tube extends into the upper abdomen beyond the margin of the examination. IMPRESSION: Stable retrocardiac consolidation, possibly reflecting infection or aspiration in the appropriate clinical setting. COPD. Stable cardiomegaly Electronically Signed   By: Fidela Salisbury M.D.   On: 05/16/2021 04:07   DG CHEST PORT 1 VIEW  Result Date: 05/15/2021 CLINICAL DATA:  Shortness of breath EXAM: PORTABLE CHEST 1 VIEW COMPARISON:  Previous studies including the examination of 05/10/2021 FINDINGS: Transverse diameter of heart is increased. There are no signs of pulmonary edema. There is peribronchial thickening. There is increased density in the left lower lung fields. There is blunting of both lateral CP angles. There is no pneumothorax. Enteric tube is noted traversing the esophagus. Distal portion of enteric tube is not visualized. IMPRESSION: Cardiomegaly. There are no signs of pulmonary edema or focal pulmonary consolidation. Increased markings are seen in the left lower lung fields suggesting atelectasis/pneumonitis in the left lower lobe. There is blunting of both lateral CP angles suggesting small bilateral pleural effusions. Electronically Signed   By: Elmer Picker M.D.   On: 05/15/2021 13:03   DG CHEST PORT 1 VIEW  Result Date:  05/10/2021 CLINICAL DATA:  Hypoxia EXAM: PORTABLE CHEST 1 VIEW COMPARISON:  Portable exam 0835 hours compared to 05/08/2021 FINDINGS: Upper normal size of cardiac silhouette. Mediastinal contours and pulmonary vascularity normal. Atherosclerotic calcification aorta. Persistent increased markings at LEFT base question atelectasis or infiltrate. Peripheral infiltrate throughout mid LEFT lung, new. Remaining lungs clear. No pleural effusion or pneumothorax. IMPRESSION: New infiltrate peripherally in the mid to lower LEFT lung. Persistent atelectasis versus consolidation LEFT lung base. Aortic Atherosclerosis (ICD10-I70.0). Electronically Signed   By: Lavonia Dana M.D.   On: 05/10/2021 08:43   DG CHEST PORT 1 VIEW  Result Date: 05/08/2021 CLINICAL DATA:  Follow-up of COPD. EXAM: PORTABLE CHEST 1 VIEW COMPARISON:  05/07/2021 FINDINGS: Numerous leads and wires project over the chest. Nasogastric tube extends beyond the inferior aspect of the film. Endotracheal tube terminates 3.5 cm above carina. Midline trachea. Mild cardiomegaly. No pleural effusion or pneumothorax. Hyperinflation. Mild interstitial thickening. Especially when compared to the 05/05/2021 exam, suspicion of developing left lower lobe airspace disease. IMPRESSION: Suspicion of developing left lower lobe atelectasis versus infection. Underlying COPD/chronic bronchitis. Aortic Atherosclerosis (ICD10-I70.0). Electronically Signed   By: Abigail Miyamoto M.D.   On: 05/08/2021 07:55   DG Knee Right Port  Result Date: 05/29/2021 CLINICAL DATA:  Knee pain EXAM: PORTABLE RIGHT KNEE - 1-2 VIEW COMPARISON:  None. FINDINGS: Two views of the right knee demonstrate regional atherosclerotic calcifications. There is marginal spurring of the patella potentially with some mild articular space loss in the patellofemoral joint. The knee is fully extended during imaging, I do not see a definite knee effusion although the extension may mildly reduced sensitivity for small  effusions. No fracture or acute bony finding is observed. IMPRESSION: 1. Mild osteoarthritis of the knee.  No acute findings. Electronically Signed   By: Van Clines M.D.   On: 05/29/2021 14:53   DG Abd Portable 1V  Result Date: 05/10/2021 CLINICAL DATA:  Feeding tube placement. EXAM: PORTABLE ABDOMEN - 1 VIEW COMPARISON:  Same day. FINDINGS: Feeding tube tip is seen at the level of the ligament of Treitz. Contrast filling of small bowel is noted. IMPRESSION: Feeding tube tip seen at level of ligament of Treitz at the junction of duodenum and jejunum. Electronically Signed   By: Marijo Conception M.D.   On: 05/10/2021 13:47   DG Abd Portable 1V  Result Date: 05/10/2021 CLINICAL DATA:  Feeding tube placement EXAM: PORTABLE ABDOMEN - 1 VIEW COMPARISON:  None. FINDINGS: Feeding tube coiled in the stomach. No bowel dilatation to suggest obstruction. No evidence of pneumoperitoneum, portal venous gas or pneumatosis. No pathologic calcifications along the expected course of the ureters. No acute osseous abnormality. IMPRESSION: Feeding tube coiled in the stomach. Electronically Signed   By: Kathreen Devoid M.D.   On: 05/10/2021 12:10   ECHOCARDIOGRAM COMPLETE  Result Date: 05/07/2021    ECHOCARDIOGRAM REPORT   Patient Name:   Ronald [name] Date of Exam: 05/07/2021 Medical Rec #:  [id number]     Height:       69.0 in Accession #:    [id number]    Weight:       194.4 lb Date of Birth:  [birth year]      BSA:          2.041 m Patient Age:    81 years      BP:           149/84 mmHg Patient Gender: M             HR:           100 bpm. Exam Location:  Inpatient Procedure: 2D Echo, Cardiac Doppler and Color Doppler Indications:    CHF  History:        Patient has prior history of Echocardiogram examinations, most                 recent 05/24/2020. CHF, CAD; Risk Factors:Former Smoker.  Sonographer:    Merrie Roof RDCS Referring Phys: [place]  1. Left ventricular ejection fraction, by estimation,  is 50 to 55%. The left ventricle has low normal function. The left ventricle has no regional wall motion abnormalities. Left ventricular diastolic function could not be evaluated.  2. Right ventricular systolic function is normal. The right ventricular size is normal. Tricuspid regurgitation signal is inadequate for assessing PA pressure.  3. The mitral valve is grossly normal. Trivial mitral valve regurgitation. No evidence of mitral stenosis.  4. The aortic valve is tricuspid. Aortic valve regurgitation is not visualized. No aortic stenosis is present. Comparison(s): Changes from prior study are noted. EF~50-55%. Atrial flutter now present. FINDINGS  Left Ventricle: Left ventricular ejection fraction, by estimation, is 50 to 55%. The left ventricle has low normal function. The left ventricle has no regional wall motion abnormalities. The left ventricular internal cavity size was normal in size. There is no left ventricular hypertrophy. Left ventricular diastolic function could not be evaluated due to atrial fibrillation. Left ventricular diastolic function could not be evaluated. Right Ventricle: The right ventricular size is normal. No increase in right ventricular wall thickness. Right ventricular systolic function is normal. Tricuspid regurgitation signal is inadequate for assessing PA pressure. Left Atrium: Left atrial size was normal in size. Right Atrium: Right atrial size was normal in size. Pericardium: There is no evidence of pericardial effusion. Presence of epicardial fat layer. Mitral Valve: The mitral valve is grossly normal. Trivial mitral valve regurgitation. No evidence of mitral valve stenosis. Tricuspid Valve: The tricuspid valve is grossly normal. Tricuspid valve regurgitation is not demonstrated. No evidence of tricuspid stenosis. Aortic Valve: The aortic valve is tricuspid. Aortic valve regurgitation is not visualized. No aortic stenosis is present. Aortic valve mean gradient measures 3.0  mmHg. Aortic valve peak gradient measures 4.7 mmHg. Aortic valve area, by VTI measures 3.12 cm. Pulmonic Valve: The pulmonic  valve was grossly normal. Pulmonic valve regurgitation is not visualized. No evidence of pulmonic stenosis. Aorta: The aortic root is normal in size and structure. Venous: IVC assessment for right atrial pressure unable to be performed due to mechanical ventilation. IAS/Shunts: The atrial septum is grossly normal.  LEFT VENTRICLE PLAX 2D LVIDd:         4.50 cm LVIDs:         3.10 cm LV PW:         1.60 cm LV IVS:        1.10 cm LVOT diam:     2.30 cm LV SV:         55 LV SV Index:   27 LVOT Area:     4.15 cm  RIGHT VENTRICLE             IVC RV S prime:     11.10 cm/s  IVC diam: 2.20 cm TAPSE (M-mode): 1.9 cm LEFT ATRIUM             Index        RIGHT ATRIUM           Index LA diam:        4.00 cm 1.96 cm/m   RA Area:     17.00 cm LA Vol (A2C):   67.5 ml 33.07 ml/m  RA Volume:   49.80 ml  24.39 ml/m LA Vol (A4C):   52.6 ml 25.77 ml/m LA Biplane Vol: 60.1 ml 29.44 ml/m  AORTIC VALVE AV Area (Vmax):    3.25 cm AV Area (Vmean):   3.21 cm AV Area (VTI):     3.12 cm AV Vmax:           108.00 cm/s AV Vmean:          73.300 cm/s AV VTI:            0.177 m AV Peak Grad:      4.7 mmHg AV Mean Grad:      3.0 mmHg LVOT Vmax:         84.50 cm/s LVOT Vmean:        56.600 cm/s LVOT VTI:          0.133 m LVOT/AV VTI ratio: 0.75  AORTA Ao Root diam: 3.90 cm  SHUNTS Systemic VTI:  0.13 m Systemic Diam: 2.30 cm Eleonore Chiquito MD Electronically signed by Eleonore Chiquito MD Signature Date/Time: 05/07/2021/5:45:41 PM    Final    DG HIP PORT UNILAT WITH PELVIS 1V RIGHT  Result Date: 05/17/2021 CLINICAL DATA:  Hip pain. EXAM: DG HIP (WITH OR WITHOUT PELVIS) 1V PORT RIGHT COMPARISON:  Abdominal radiograph 05/10/2021 FINDINGS: No acute fracture or hip dislocation is identified. Right hip joint space width is preserved. A 2.5 cm dense focus in the right pelvis may reflect some residual oral contrast material  from the prior study. Atherosclerotic vascular calcifications are noted. An enteric tube is partially visualized in the central abdomen. IMPRESSION: No acute osseous abnormality or significant arthropathic changes identified about the right hip. Electronically Signed   By: Logan Bores M.D.   On: 05/17/2021 14:34   DG FEMUR PORT, 1V RIGHT  Result Date: 05/29/2021 CLINICAL DATA:  Pain in the leg EXAM: RIGHT FEMUR PORTABLE 1 VIEW COMPARISON:  Knee radiographs from 05/29/2021 and hip radiographs from 05/17/2021 FINDINGS: Mild degenerative spurring of the right femoral head. Regional arterial atherosclerotic vascular calcifications noted. No fracture or acute bony findings on this frontal projection. IMPRESSION: 1. Frontal projection  of the femur demonstrates no fracture or acute bony findings. 2. Atherosclerosis. 3. Mild degenerative spurring of the right femoral head. Electronically Signed   By: Van Clines M.D.   On: 05/29/2021 14:51      Subjective:  Patient denies any complaints this morning, no shortness of breath, no chest pain.  There is no significant events as discussed with staff Discharge Exam: Vitals:   06/06/21 0813 06/06/21 0818  BP:  134/63  Pulse:  (!) 108  Resp:    Temp:    SpO2: 96%    Vitals:   06/06/21 0500 06/06/21 0748 06/06/21 0813 06/06/21 0818  BP:  (!) 133/54  134/63  Pulse:  95  (!) 108  Resp:  20    Temp:  98.7 F (37.1 C)    TempSrc:  Axillary    SpO2:  96% 96%   Weight: 83 kg        Awake Alert, in no apparent distress, pleasant Symmetrical Chest wall movement, diminished air entry at the bases  irregular irregular,No Gallops,Rubs or new Murmurs, No Parasternal Heave +ve B.Sounds, Abd Soft, No tenderness, PEG surgeon site with no oozing or discharge, no rebound - guarding or rigidity. No Cyanosis, Clubbing or edema, No new Rash or bruise      The results of significant diagnostics from this hospitalization (including imaging, microbiology,  ancillary and laboratory) are listed below for reference.     Microbiology: Recent Results (from the past 240 hour(s))  SARS CORONAVIRUS 2 (TAT 6-24 HRS) Nasopharyngeal Nasopharyngeal Swab     Status: None   Collection Time: 06/05/21 11:43 AM   Specimen: Nasopharyngeal Swab  Result Value Ref Range Status   SARS Coronavirus 2 NEGATIVE NEGATIVE Final    Comment: (NOTE) SARS-CoV-2 target nucleic acids are NOT DETECTED.  The SARS-CoV-2 RNA is generally detectable in upper and lower respiratory specimens during the acute phase of infection. Negative results do not preclude SARS-CoV-2 infection, do not rule out co-infections with other pathogens, and should not be used as the sole basis for treatment or other patient management decisions. Negative results must be combined with clinical observations, patient history, and epidemiological information. The expected result is Negative.  Fact Sheet for Patients: SugarRoll.be  Fact Sheet for Healthcare Providers: https://www.woods-mathews.com/  This test is not yet approved or cleared by the [place] FDA and  has been authorized for detection and/or diagnosis of SARS-CoV-2 by FDA under an Emergency Use Authorization (EUA). This EUA will remain  in effect (meaning this test can be used) for the duration of the COVID-19 declaration under Se ction 564(b)(1) of the Act, 21 U.S.C. section 360bbb-3(b)(1), unless the authorization is terminated or revoked sooner.  Performed at [facility], [place] [address] [place], [place] [address]      Labs: BNP (last 3 results) Recent Labs    12/21/20 1456 05/01/21 1310 05/08/21 0014  BNP 364.0* 1,654.0* 751.0*   Basic Metabolic Panel: Recent Labs  Lab 06/01/21 0232 06/03/21 0351 06/06/21 0343  NA 135 137 132*  K 4.2 4.3 3.7  CL 97* 99 93*  CO2 32 29 32  GLUCOSE 245* 96 211*  BUN 23 23 24*  CREATININE 0.89 0.79 0.85  CALCIUM 8.1* 8.2*  8.3*  MG 2.0 2.0  --   PHOS 4.0  --   --    Liver Function Tests: No results for input(s): AST, ALT, ALKPHOS, BILITOT, PROT, ALBUMIN in the last 168 hours. No results for input(s): LIPASE, AMYLASE in  the last 168 hours. No results for input(s): AMMONIA in the last 168 hours. CBC: Recent Labs  Lab 06/01/21 0232 06/03/21 0351 06/04/21 0344 06/06/21 0343  WBC 5.6 7.9 6.5 9.0  HGB 9.3* 10.3* 10.0* 10.4*  HCT 31.0* 34.0* 32.8* 34.2*  MCV 96.3 96.0 95.1 95.0  PLT 359 316 343 382   Cardiac Enzymes: No results for input(s): CKTOTAL, CKMB, CKMBINDEX, TROPONINI in the last 168 hours. BNP: Invalid input(s): POCBNP CBG: Recent Labs  Lab 06/05/21 1559 06/05/21 2023 06/06/21 0007 06/06/21 0405 06/06/21 0751  GLUCAP 171* 136* 177* 193* 214*   D-Dimer No results for input(s): DDIMER in the last 72 hours. Hgb A1c No results for input(s): HGBA1C in the last 72 hours. Lipid Profile No results for input(s): CHOL, HDL, LDLCALC, TRIG, CHOLHDL, LDLDIRECT in the last 72 hours. Thyroid function studies No results for input(s): TSH, T4TOTAL, T3FREE, THYROIDAB in the last 72 hours.  Invalid input(s): FREET3 Anemia work up No results for input(s): VITAMINB12, FOLATE, FERRITIN, TIBC, IRON, RETICCTPCT in the last 72 hours. Urinalysis    Component Value Date/Time   COLORURINE YELLOW 05/01/2021 1525   APPEARANCEUR CLEAR 05/01/2021 1525   APPEARANCEUR Clear 04/18/2021 1539   LABSPEC >1.030 (H) 05/01/2021 1525   PHURINE 6.0 05/01/2021 1525   GLUCOSEU 100 (A) 05/01/2021 1525   HGBUR MODERATE (A) 05/01/2021 1525   BILIRUBINUR NEGATIVE 05/01/2021 1525   BILIRUBINUR Negative 04/18/2021 1539   KETONESUR NEGATIVE 05/01/2021 1525   PROTEINUR >300 (A) 05/01/2021 1525   UROBILINOGEN 0.2 06/22/2014 1918   NITRITE NEGATIVE 05/01/2021 1525   LEUKOCYTESUR NEGATIVE 05/01/2021 1525   Sepsis Labs Invalid input(s): PROCALCITONIN,  WBC,  LACTICIDVEN Microbiology Recent Results (from the past 240  hour(s))  SARS CORONAVIRUS 2 (TAT 6-24 HRS) Nasopharyngeal Nasopharyngeal Swab     Status: None   Collection Time: 06/05/21 11:43 AM   Specimen: Nasopharyngeal Swab  Result Value Ref Range Status   SARS Coronavirus 2 NEGATIVE NEGATIVE Final    Comment: (NOTE) SARS-CoV-2 target nucleic acids are NOT DETECTED.  The SARS-CoV-2 RNA is generally detectable in upper and lower respiratory specimens during the acute phase of infection. Negative results do not preclude SARS-CoV-2 infection, do not rule out co-infections with other pathogens, and should not be used as the sole basis for treatment or other patient management decisions. Negative results must be combined with clinical observations, patient history, and epidemiological information. The expected result is Negative.  Fact Sheet for Patients: SugarRoll.be  Fact Sheet for Healthcare Providers: https://www.woods-mathews.com/  This test is not yet approved or cleared by the [place] FDA and  has been authorized for detection and/or diagnosis of SARS-CoV-2 by FDA under an Emergency Use Authorization (EUA). This EUA will remain  in effect (meaning this test can be used) for the duration of the COVID-19 declaration under Se ction 564(b)(1) of the Act, 21 U.S.C. section 360bbb-3(b)(1), unless the authorization is terminated or revoked sooner.  Performed at [facility], [place] [address], [place], [place] [address]      Time coordinating discharge: Over 30 minutes  SIGNED:   Phillips Climes, MD  [facility] 06/06/2021, 10:57 AM Pager   If 7PM-7AM, please contact night-coverage [email or web address] Password [email or web address]

## 2021-06-06 NOTE — Progress Notes (Signed)
Nutrition Follow-up  DOCUMENTATION CODES:   Non-severe (moderate) malnutrition in context of acute illness/injury  INTERVENTION:   Continue nocturnal TF via PEG: Osmolite 1.5 @ 63ml/h x 12 hours at night (6pm-6am), 900 ml total per night Prosource TF 45 ml TID   Provides 1470 kcal (67 % of estimated needs), 89 gm protein (74.5% of estimated needs), 685 ml free water daily.    Continue Glucerna Shake po BID, each supplement provides 220 kcal and 10 grams of protein   NUTRITION DIAGNOSIS:   Moderate Malnutrition related to acute illness (influenza A) as evidenced by mild muscle depletion, mild fat depletion.  ongoing  GOAL:   Patient will meet greater than or equal to 90% of their needs  Addressing with TF  MONITOR:   TF tolerance, Labs, Diet advancement  REASON FOR ASSESSMENT:   Consult Enteral/tube feeding initiation and management  ASSESSMENT:   75 yo male admitted from [facility] with acute respiratory failure, influenza A. PMH includes COPD, CAD, ulcerative colitis, HTN, asthma, DM-2, TIA, GERD, HLD.  Pt underwent PEG placement on 12/27 2/2 ongoing inadequate PO intake. No PO intake has been documented since last RD assessment, but pt's intake had consistently been <25%. Pt had been receiving nocturnal TF via Cortrak without tolerance issues. Plan for nocturnal TF to be continued via PEG.   Pt has continued to have poor PO intake since last RD assessment despite trying his best to eat meals PO. Pt understands he may need to have PEG placed if unable to improve intake and is agreeable to doing so if necessary. MD discussing with pt's family.  Current orders: Osmolite 1.5 @ 75 x 12 hours with Prosource TF TID.   UOP: 975ml x24 hours I/O: -346ml since admit  Current weight: 83 kg Admit weight: 85.4 kg   Medications: Scheduled Meds:  amiodarone  100 mg Per Tube Daily   apixaban  2.5 mg Per Tube BID   arformoterol  15 mcg Nebulization BID   atorvastatin  40 mg Per  Tube QPM   budesonide (PULMICORT) nebulizer solution  0.5 mg Nebulization BID   carvedilol  37.5 mg Per Tube BID WC   Chlorhexidine Gluconate Cloth  6 each Topical Daily   docusate  100 mg Per Tube BID   escitalopram  20 mg Per Tube Daily   feeding supplement (GLUCERNA SHAKE)  237 mL Oral BID BM   feeding supplement (OSMOLITE 1.5 CAL)  900 mL Per Tube Q24H   feeding supplement (PROSource TF)  45 mL Per Tube TID   free water  30 mL Per Tube Q4H   furosemide  20 mg Oral Daily   guaiFENesin  5 mL Per Tube Q6H   insulin aspart  0-9 Units Subcutaneous TID WC   insulin glargine-yfgn  15 Units Subcutaneous BID   iohexol  75 mL Per Tube Once   isosorbide dinitrate  10 mg Per Tube BID   mouth rinse  15 mL Mouth Rinse BID   melatonin  3 mg Per Tube QHS   nicotine  14 mg Transdermal Daily   pantoprazole sodium  40 mg Per Tube q1800   polyethylene glycol  17 g Per Tube Daily   QUEtiapine  100 mg Per Tube QHS   QUEtiapine  25 mg Per Tube Daily  Continuous Infusions:  sodium chloride Stopped (06/01/21 2232)    Labs: Recent Labs  Lab 06/01/21 0232 06/03/21 0351 06/06/21 0343  NA 135 137 132*  K 4.2 4.3 3.7  CL  97* 99 93*  CO2 32 29 32  BUN 23 23 24*  CREATININE 0.89 0.79 0.85  CALCIUM 8.1* 8.2* 8.3*  MG 2.0 2.0  --   PHOS 4.0  --   --   GLUCOSE 245* 96 211*  CBGs: 106-214 x24 hours   Diet Order:   Diet Order             Diet - low sodium heart healthy           DIET DYS 2 Room service appropriate? Yes; Fluid consistency: Thin  Diet effective now                   EDUCATION NEEDS:   No education needs have been identified at this time  Skin:  Skin Assessment: Skin Integrity Issues: Skin Integrity Issues:: Other (Comment) (skin tear scrotum)  Last BM:  12/28  Height:   Ht Readings from Last 1 Encounters:  04/27/21 5\' 9"  (1.753 m)    Weight:   Wt Readings from Last 1 Encounters:  06/06/21 83 kg     BMI:  Body mass index is 27.02 kg/m.  Estimated  Nutritional Needs:   Kcal:  2200-2400  Protein:  120-130 gm  Fluid:  2.1-2.3 L     Inayah Woodin A., MS, RD, LDN (she/her/hers) RD pager number and weekend/on-call pager number located in [place].

## 2021-06-06 NOTE — Care Management Important Message (Signed)
Important Message  Patient Details  Name: Ronald [name] MRN: [id number] Date of Birth: 01-29-1946   Medicare Important Message Given:  Yes     Hannah Beat 06/06/2021, 12:42 PM

## 2021-06-12 DIAGNOSIS — E559 Vitamin D deficiency, unspecified: Secondary | ICD-10-CM | POA: Diagnosis not present

## 2021-06-12 DIAGNOSIS — E119 Type 2 diabetes mellitus without complications: Secondary | ICD-10-CM | POA: Diagnosis not present

## 2021-06-12 DIAGNOSIS — I10 Essential (primary) hypertension: Secondary | ICD-10-CM | POA: Diagnosis not present

## 2021-06-21 DIAGNOSIS — E8729 Other acidosis: Secondary | ICD-10-CM | POA: Diagnosis not present

## 2021-06-21 DIAGNOSIS — R0689 Other abnormalities of breathing: Secondary | ICD-10-CM | POA: Diagnosis not present

## 2021-06-21 DIAGNOSIS — I5033 Acute on chronic diastolic (congestive) heart failure: Secondary | ICD-10-CM | POA: Diagnosis not present

## 2021-06-21 DIAGNOSIS — K219 Gastro-esophageal reflux disease without esophagitis: Secondary | ICD-10-CM | POA: Diagnosis not present

## 2021-06-21 DIAGNOSIS — Z720 Tobacco use: Secondary | ICD-10-CM | POA: Diagnosis not present

## 2021-06-21 DIAGNOSIS — I5021 Acute systolic (congestive) heart failure: Secondary | ICD-10-CM | POA: Diagnosis not present

## 2021-06-21 DIAGNOSIS — J439 Emphysema, unspecified: Secondary | ICD-10-CM | POA: Diagnosis not present

## 2021-06-21 DIAGNOSIS — R4182 Altered mental status, unspecified: Secondary | ICD-10-CM | POA: Diagnosis not present

## 2021-06-21 DIAGNOSIS — I471 Supraventricular tachycardia: Secondary | ICD-10-CM | POA: Diagnosis not present

## 2021-06-21 DIAGNOSIS — J9622 Acute and chronic respiratory failure with hypercapnia: Secondary | ICD-10-CM | POA: Diagnosis not present

## 2021-06-21 DIAGNOSIS — I5030 Unspecified diastolic (congestive) heart failure: Secondary | ICD-10-CM | POA: Diagnosis not present

## 2021-06-21 DIAGNOSIS — J9621 Acute and chronic respiratory failure with hypoxia: Secondary | ICD-10-CM | POA: Diagnosis not present

## 2021-06-21 DIAGNOSIS — X58XXXA Exposure to other specified factors, initial encounter: Secondary | ICD-10-CM | POA: Diagnosis not present

## 2021-06-21 DIAGNOSIS — I517 Cardiomegaly: Secondary | ICD-10-CM | POA: Diagnosis not present

## 2021-06-21 DIAGNOSIS — I509 Heart failure, unspecified: Secondary | ICD-10-CM | POA: Diagnosis not present

## 2021-06-21 DIAGNOSIS — Z743 Need for continuous supervision: Secondary | ICD-10-CM | POA: Diagnosis not present

## 2021-06-21 DIAGNOSIS — G4733 Obstructive sleep apnea (adult) (pediatric): Secondary | ICD-10-CM | POA: Diagnosis not present

## 2021-06-21 DIAGNOSIS — I2510 Atherosclerotic heart disease of native coronary artery without angina pectoris: Secondary | ICD-10-CM | POA: Diagnosis not present

## 2021-06-21 DIAGNOSIS — J90 Pleural effusion, not elsewhere classified: Secondary | ICD-10-CM | POA: Diagnosis not present

## 2021-06-21 DIAGNOSIS — I499 Cardiac arrhythmia, unspecified: Secondary | ICD-10-CM | POA: Diagnosis not present

## 2021-06-21 DIAGNOSIS — I472 Ventricular tachycardia, unspecified: Secondary | ICD-10-CM | POA: Diagnosis not present

## 2021-06-21 DIAGNOSIS — I4729 Other ventricular tachycardia: Secondary | ICD-10-CM | POA: Diagnosis not present

## 2021-06-21 DIAGNOSIS — J9601 Acute respiratory failure with hypoxia: Secondary | ICD-10-CM | POA: Diagnosis not present

## 2021-06-21 DIAGNOSIS — E119 Type 2 diabetes mellitus without complications: Secondary | ICD-10-CM | POA: Diagnosis not present

## 2021-06-21 DIAGNOSIS — I110 Hypertensive heart disease with heart failure: Secondary | ICD-10-CM | POA: Diagnosis not present

## 2021-06-21 DIAGNOSIS — G9340 Encephalopathy, unspecified: Secondary | ICD-10-CM | POA: Diagnosis not present

## 2021-06-21 DIAGNOSIS — R000 Tachycardia, unspecified: Secondary | ICD-10-CM | POA: Diagnosis not present

## 2021-06-21 DIAGNOSIS — I5041 Acute combined systolic (congestive) and diastolic (congestive) heart failure: Secondary | ICD-10-CM | POA: Diagnosis not present

## 2021-06-21 DIAGNOSIS — I4891 Unspecified atrial fibrillation: Secondary | ICD-10-CM | POA: Diagnosis not present

## 2021-06-21 DIAGNOSIS — J441 Chronic obstructive pulmonary disease with (acute) exacerbation: Secondary | ICD-10-CM | POA: Diagnosis not present

## 2021-06-21 DIAGNOSIS — R6889 Other general symptoms and signs: Secondary | ICD-10-CM | POA: Diagnosis not present

## 2021-06-21 DIAGNOSIS — Z930 Tracheostomy status: Secondary | ICD-10-CM | POA: Diagnosis not present

## 2021-06-21 DIAGNOSIS — I10 Essential (primary) hypertension: Secondary | ICD-10-CM | POA: Diagnosis not present

## 2021-06-21 DIAGNOSIS — J811 Chronic pulmonary edema: Secondary | ICD-10-CM | POA: Diagnosis not present

## 2021-06-21 DIAGNOSIS — I4892 Unspecified atrial flutter: Secondary | ICD-10-CM | POA: Diagnosis not present

## 2021-06-21 DIAGNOSIS — R0600 Dyspnea, unspecified: Secondary | ICD-10-CM | POA: Diagnosis not present

## 2021-06-21 DIAGNOSIS — S32009A Unspecified fracture of unspecified lumbar vertebra, initial encounter for closed fracture: Secondary | ICD-10-CM | POA: Diagnosis not present

## 2021-06-21 DIAGNOSIS — G9341 Metabolic encephalopathy: Secondary | ICD-10-CM | POA: Diagnosis not present

## 2021-06-27 ENCOUNTER — Other Ambulatory Visit: Payer: Self-pay | Admitting: Family

## 2021-06-28 DIAGNOSIS — G9341 Metabolic encephalopathy: Secondary | ICD-10-CM | POA: Diagnosis not present

## 2021-06-28 DIAGNOSIS — J9621 Acute and chronic respiratory failure with hypoxia: Secondary | ICD-10-CM | POA: Diagnosis not present

## 2021-06-28 DIAGNOSIS — J439 Emphysema, unspecified: Secondary | ICD-10-CM | POA: Diagnosis not present

## 2021-06-28 DIAGNOSIS — I5033 Acute on chronic diastolic (congestive) heart failure: Secondary | ICD-10-CM | POA: Diagnosis not present

## 2021-06-28 DIAGNOSIS — M6281 Muscle weakness (generalized): Secondary | ICD-10-CM | POA: Diagnosis not present

## 2021-06-28 DIAGNOSIS — I110 Hypertensive heart disease with heart failure: Secondary | ICD-10-CM | POA: Diagnosis not present

## 2021-06-28 DIAGNOSIS — S32009D Unspecified fracture of unspecified lumbar vertebra, subsequent encounter for fracture with routine healing: Secondary | ICD-10-CM | POA: Diagnosis not present

## 2021-06-28 DIAGNOSIS — I509 Heart failure, unspecified: Secondary | ICD-10-CM | POA: Diagnosis not present

## 2021-06-28 DIAGNOSIS — I4892 Unspecified atrial flutter: Secondary | ICD-10-CM | POA: Diagnosis not present

## 2021-06-28 DIAGNOSIS — E119 Type 2 diabetes mellitus without complications: Secondary | ICD-10-CM | POA: Diagnosis not present

## 2021-06-28 DIAGNOSIS — J9622 Acute and chronic respiratory failure with hypercapnia: Secondary | ICD-10-CM | POA: Diagnosis not present

## 2021-06-28 DIAGNOSIS — R5381 Other malaise: Secondary | ICD-10-CM | POA: Diagnosis not present

## 2021-06-28 DIAGNOSIS — K922 Gastrointestinal hemorrhage, unspecified: Secondary | ICD-10-CM | POA: Diagnosis not present

## 2021-06-28 DIAGNOSIS — R69 Illness, unspecified: Secondary | ICD-10-CM | POA: Diagnosis not present

## 2021-06-28 DIAGNOSIS — I4891 Unspecified atrial fibrillation: Secondary | ICD-10-CM | POA: Diagnosis not present

## 2021-06-28 DIAGNOSIS — K219 Gastro-esophageal reflux disease without esophagitis: Secondary | ICD-10-CM | POA: Diagnosis not present

## 2021-06-28 DIAGNOSIS — G4733 Obstructive sleep apnea (adult) (pediatric): Secondary | ICD-10-CM | POA: Diagnosis not present

## 2021-06-28 DIAGNOSIS — I5032 Chronic diastolic (congestive) heart failure: Secondary | ICD-10-CM | POA: Diagnosis not present

## 2021-06-28 DIAGNOSIS — I2510 Atherosclerotic heart disease of native coronary artery without angina pectoris: Secondary | ICD-10-CM | POA: Diagnosis not present

## 2021-06-28 DIAGNOSIS — J441 Chronic obstructive pulmonary disease with (acute) exacerbation: Secondary | ICD-10-CM | POA: Diagnosis not present

## 2021-06-28 DIAGNOSIS — J9602 Acute respiratory failure with hypercapnia: Secondary | ICD-10-CM | POA: Diagnosis not present

## 2021-07-01 DIAGNOSIS — I5033 Acute on chronic diastolic (congestive) heart failure: Secondary | ICD-10-CM | POA: Diagnosis not present

## 2021-07-02 DIAGNOSIS — I509 Heart failure, unspecified: Secondary | ICD-10-CM | POA: Diagnosis not present

## 2021-07-02 DIAGNOSIS — R5381 Other malaise: Secondary | ICD-10-CM | POA: Diagnosis not present

## 2021-07-03 DIAGNOSIS — K922 Gastrointestinal hemorrhage, unspecified: Secondary | ICD-10-CM | POA: Diagnosis not present

## 2021-07-03 DIAGNOSIS — G9341 Metabolic encephalopathy: Secondary | ICD-10-CM | POA: Diagnosis not present

## 2021-07-03 DIAGNOSIS — R5381 Other malaise: Secondary | ICD-10-CM | POA: Diagnosis not present

## 2021-07-03 DIAGNOSIS — Z720 Tobacco use: Secondary | ICD-10-CM | POA: Diagnosis not present

## 2021-07-04 ENCOUNTER — Ambulatory Visit: Payer: Medicare HMO | Admitting: Urology

## 2021-07-04 ENCOUNTER — Telehealth: Payer: Self-pay | Admitting: Family

## 2021-07-04 DIAGNOSIS — J441 Chronic obstructive pulmonary disease with (acute) exacerbation: Secondary | ICD-10-CM | POA: Diagnosis not present

## 2021-07-04 DIAGNOSIS — K922 Gastrointestinal hemorrhage, unspecified: Secondary | ICD-10-CM | POA: Diagnosis not present

## 2021-07-04 DIAGNOSIS — I5033 Acute on chronic diastolic (congestive) heart failure: Secondary | ICD-10-CM | POA: Diagnosis not present

## 2021-07-04 DIAGNOSIS — I2510 Atherosclerotic heart disease of native coronary artery without angina pectoris: Secondary | ICD-10-CM | POA: Diagnosis not present

## 2021-07-04 DIAGNOSIS — I110 Hypertensive heart disease with heart failure: Secondary | ICD-10-CM | POA: Diagnosis not present

## 2021-07-04 DIAGNOSIS — J9621 Acute and chronic respiratory failure with hypoxia: Secondary | ICD-10-CM | POA: Diagnosis not present

## 2021-07-04 DIAGNOSIS — R69 Illness, unspecified: Secondary | ICD-10-CM | POA: Diagnosis not present

## 2021-07-04 DIAGNOSIS — G9341 Metabolic encephalopathy: Secondary | ICD-10-CM | POA: Diagnosis not present

## 2021-07-04 DIAGNOSIS — R351 Nocturia: Secondary | ICD-10-CM

## 2021-07-04 DIAGNOSIS — I5030 Unspecified diastolic (congestive) heart failure: Secondary | ICD-10-CM | POA: Diagnosis not present

## 2021-07-04 DIAGNOSIS — E46 Unspecified protein-calorie malnutrition: Secondary | ICD-10-CM | POA: Diagnosis not present

## 2021-07-04 DIAGNOSIS — Z431 Encounter for attention to gastrostomy: Secondary | ICD-10-CM | POA: Diagnosis not present

## 2021-07-04 DIAGNOSIS — J9602 Acute respiratory failure with hypercapnia: Secondary | ICD-10-CM | POA: Diagnosis not present

## 2021-07-04 DIAGNOSIS — N3941 Urge incontinence: Secondary | ICD-10-CM

## 2021-07-04 NOTE — Telephone Encounter (Signed)
Appt made for tomorrow.

## 2021-07-04 NOTE — Telephone Encounter (Signed)
Calling to discuss pts meds. He was seen at the [facility] and is home now but almost out of meds. Please call back and advise.

## 2021-07-05 ENCOUNTER — Ambulatory Visit: Payer: Medicare HMO | Admitting: Family

## 2021-07-05 ENCOUNTER — Ambulatory Visit (INDEPENDENT_AMBULATORY_CARE_PROVIDER_SITE_OTHER): Payer: Medicare HMO | Admitting: Family

## 2021-07-05 ENCOUNTER — Encounter: Payer: Self-pay | Admitting: Family

## 2021-07-05 DIAGNOSIS — F1320 Sedative, hypnotic or anxiolytic dependence, uncomplicated: Secondary | ICD-10-CM

## 2021-07-05 DIAGNOSIS — J9602 Acute respiratory failure with hypercapnia: Secondary | ICD-10-CM | POA: Diagnosis not present

## 2021-07-05 DIAGNOSIS — I509 Heart failure, unspecified: Secondary | ICD-10-CM

## 2021-07-05 DIAGNOSIS — I10 Essential (primary) hypertension: Secondary | ICD-10-CM

## 2021-07-05 DIAGNOSIS — Z79899 Other long term (current) drug therapy: Secondary | ICD-10-CM | POA: Diagnosis not present

## 2021-07-05 DIAGNOSIS — I2510 Atherosclerotic heart disease of native coronary artery without angina pectoris: Secondary | ICD-10-CM | POA: Diagnosis not present

## 2021-07-05 DIAGNOSIS — G9341 Metabolic encephalopathy: Secondary | ICD-10-CM | POA: Diagnosis not present

## 2021-07-05 DIAGNOSIS — I110 Hypertensive heart disease with heart failure: Secondary | ICD-10-CM | POA: Diagnosis not present

## 2021-07-05 DIAGNOSIS — Z09 Encounter for follow-up examination after completed treatment for conditions other than malignant neoplasm: Secondary | ICD-10-CM

## 2021-07-05 DIAGNOSIS — E1142 Type 2 diabetes mellitus with diabetic polyneuropathy: Secondary | ICD-10-CM

## 2021-07-05 DIAGNOSIS — R531 Weakness: Secondary | ICD-10-CM

## 2021-07-05 DIAGNOSIS — R69 Illness, unspecified: Secondary | ICD-10-CM | POA: Diagnosis not present

## 2021-07-05 DIAGNOSIS — J9611 Chronic respiratory failure with hypoxia: Secondary | ICD-10-CM | POA: Diagnosis not present

## 2021-07-05 DIAGNOSIS — K922 Gastrointestinal hemorrhage, unspecified: Secondary | ICD-10-CM | POA: Diagnosis not present

## 2021-07-05 DIAGNOSIS — J42 Unspecified chronic bronchitis: Secondary | ICD-10-CM

## 2021-07-05 DIAGNOSIS — F419 Anxiety disorder, unspecified: Secondary | ICD-10-CM | POA: Diagnosis not present

## 2021-07-05 DIAGNOSIS — Z91199 Patient's noncompliance with other medical treatment and regimen due to unspecified reason: Secondary | ICD-10-CM

## 2021-07-05 DIAGNOSIS — Z431 Encounter for attention to gastrostomy: Secondary | ICD-10-CM | POA: Diagnosis not present

## 2021-07-05 DIAGNOSIS — E46 Unspecified protein-calorie malnutrition: Secondary | ICD-10-CM | POA: Diagnosis not present

## 2021-07-05 DIAGNOSIS — J441 Chronic obstructive pulmonary disease with (acute) exacerbation: Secondary | ICD-10-CM | POA: Diagnosis not present

## 2021-07-05 DIAGNOSIS — I5030 Unspecified diastolic (congestive) heart failure: Secondary | ICD-10-CM | POA: Diagnosis not present

## 2021-07-05 MED ORDER — BLOOD GLUCOSE METER KIT: PACK | 0 refills | Status: DC

## 2021-07-05 MED ORDER — BUDESONIDE 0.5 MG/2ML IN SUSP: 0.5000 mg | Freq: Two times a day (BID) | RESPIRATORY_TRACT | 12 refills | Status: DC

## 2021-07-05 MED ORDER — ALPRAZOLAM 0.5 MG PO TABS: 0.5000 mg | ORAL_TABLET | Freq: Two times a day (BID) | ORAL | 2 refills | Status: DC | PRN

## 2021-07-05 MED ORDER — OMEPRAZOLE MAGNESIUM 20 MG PO TBEC: 20.0000 mg | DELAYED_RELEASE_TABLET | Freq: Every day | ORAL | 1 refills | Status: DC

## 2021-07-05 NOTE — Progress Notes (Signed)
°Virtual Visit Consent  ° °Ronald Ronald [name], you are scheduled for a virtual visit with a [facility] provider today.   °  °Just as with appointments in the office, your consent must be obtained to participate.  Your consent will be active for this visit and any virtual visit you may have with one of our providers in the next 365 days.   °  °If you have a MyChart account, a copy of this consent can be sent to you electronically.  All virtual visits are billed to your insurance company just like a traditional visit in the office.   ° °As this is a virtual visit, video technology does not allow for your provider to perform a traditional examination.  This may limit your provider's ability to fully assess your condition.  If your provider identifies any concerns that need to be evaluated in person or the need to arrange testing (such as labs, EKG, etc.), we will make arrangements to do so.   °  °Although advances in technology are sophisticated, we cannot ensure that it will always work on either your end or our end.  If the connection with a video visit is poor, the visit may have to be switched to a telephone visit.  With either a video or telephone visit, we are not always able to ensure that we have a secure connection.    ° °I need to obtain your verbal consent now.   Are you willing to proceed with your visit today?  °  °Ronald Ronald [name] has provided verbal consent on 07/05/2021 for a virtual visit (video or telephone). °  ° , FNP  ° °Date: 07/05/2021 3:12 PM ° ° °Virtual Visit via Video Note  ° °I,  , connected with  Ronald Ronald [name]  ([id number], 07/12/1945) on 07/05/21 at  2:40 PM EST by a video-enabled telemedicine application and verified that I am speaking with the correct person using two identifiers. ° °Location: °Patient: Virtual Visit Location Patient: Home °Provider: Virtual Visit Location Provider: Home Office °  °I discussed the limitations of evaluation and management by telemedicine and  the availability of in person appointments. The patient expressed understanding and agreed to proceed.   ° °History of Present Illness: °Ronald Ronald [name] is a 75 y.o. who identifies as a male who was assigned male at birth, and is being seen today for hospital follow up. He went to the ED on 05/01/21 for acute respiratory failure and hypoxia. He had a PEG tube placed and was doing tube feeding. He was discharged to SNF to 06/06/21. He then developed CHF exacerbation and went back to ED on 06/21/21 and discharged back to SNF on 06/28/21. He was discharged from SNF back home 07/03/21. ° °He still has PEG tube placed, but just flushing daily.  ° °He is taking the lasix daily. He has ordered a scale to start weighing daily. He has PT and OT and home health TID a week.  ° °He is wearing continuous 3 L. He reports his breathing is "fine". Continues to have a lot of weakness.  ° °He has DM, HTN, COPD, and GAD.  ° °HPI: HPI  °Problems:  °Patient Active Problem List  ° Diagnosis Date Noted  ° Malnutrition of moderate degree 05/13/2021  ° Atrial flutter (HCC) 05/01/2021  ° Shock circulatory (HCC) 05/01/2021  ° Acute hypercapnic respiratory failure (HCC) 05/01/2021  ° Elevated brain natriuretic peptide (BNP) level 12/21/2020  ° Elevated troponin 12/21/2020  ° Hypokalemia   Hypokalemia 12/21/2020   Hyperglycemia due to diabetes mellitus (Ronald [name]) 12/21/2020   Hyperlipidemia 12/21/2020   Chest pain 05/23/2020   Controlled substance agreement signed 04/03/2020   Benzodiazepine dependence ([place]) 04/03/2020   Dysphagia 02/27/2020   Tracheal stenosis 12/09/2019   Noncompliance 04/01/2019   Lymphedema 04/01/2019   CHF (congestive heart failure) ([place]) 08/27/2018   Aortic atherosclerosis ([place]) 05/11/2017   Depression 05/05/2017   GERD (gastroesophageal reflux disease) 01/16/2017   Diabetic neuropathy ([place]) 01/16/2017   Anterolisthesis 12/04/2016   History of MI (myocardial infarction) 12/04/2016   OSA (obstructive sleep apnea) 12/04/2016    Closed fracture dislocation of lumbar spine ([place]) 07/25/2016   Closed fracture of body of sternum 07/25/2016   Multiple fractures of cervical spine ([place]) 07/25/2016   Tachycardia 07/25/2016   Tachypnea 07/25/2016   Anxiety 02/14/2016   Chronic respiratory failure with hypoxia ([place]) 07/19/2014   Essential hypertension 07/19/2014   Cigarette smoker 07/19/2014   Chest pain at rest 06/22/2014   Coronary artery disease 06/22/2014   Diabetes mellitus ([place]) 06/22/2014   COPD exacerbation ([place]) 06/22/2014   Tobacco abuse 06/22/2014   Acute encephalopathy 06/22/2014   Diabetes mellitus type 2 in obese ([place]) 05/11/2014   Simple chronic bronchitis ([place]) 05/11/2014    Allergies:  Allergies  Allergen Reactions   Gabapentin Anxiety    Unknown reaction   Metformin And Related Rash   Medications:  Current Outpatient Medications:    blood glucose meter kit and supplies, Dispense based on patient and insurance preference. Use up to four times daily as directed. (FOR ICD-10 E10.9, E11.9)., Disp: 1 each, Rfl: 0   omeprazole (PRILOSEC OTC) 20 MG tablet, Take 1 tablet (20 mg total) by mouth daily., Disp: 90 tablet, Rfl: 1   acetaminophen (TYLENOL) 325 MG tablet, Place 2 tablets (650 mg total) into feeding tube every 4 (four) hours as needed for mild pain (temp > 101.5)., Disp: , Rfl:    albuterol (VENTOLIN HFA) 108 (90 Base) MCG/ACT inhaler, INHALE 2 PUFFS EVERY 6 HOURS AS NEEEDED (Patient taking differently: Inhale 2 puffs into the lungs every 6 (six) hours as needed for wheezing or shortness of breath.), Disp: 6.7 g, Rfl: 0   ALPRAZolam (XANAX) 0.5 MG tablet, Take 1 tablet (0.5 mg total) by mouth 2 (two) times daily as needed. for anxiety, Disp: 60 tablet, Rfl: 2   amiodarone (PACERONE) 100 MG tablet, Place 1 tablet (100 mg total) into feeding tube daily., Disp: , Rfl:    apixaban (ELIQUIS) 2.5 MG TABS tablet, Place 1 tablet (2.5 mg total) into feeding tube 2 (two) times daily., Disp: 60 tablet, Rfl:     Ascorbic Acid (VITAMIN C) 1000 MG tablet, Take 1,000 mg by mouth daily., Disp: , Rfl:    atorvastatin (LIPITOR) 40 MG tablet, TAKE 1 TABLET BY MOUTH ONCE DAILY IN THE EVENING (Patient taking differently: Take 40 mg by mouth every evening.), Disp: 90 tablet, Rfl: 0   Blood Glucose Monitoring Suppl (ACCU-CHEK GUIDE ME) w/Device KIT, Check BS daily Dx E11.9, Disp: 1 kit, Rfl: 0   budesonide (PULMICORT) 0.5 MG/2ML nebulizer solution, Take 2 mLs (0.5 mg total) by nebulization 2 (two) times daily., Disp: 100 mL, Rfl: 12   cholecalciferol (VITAMIN D3) 25 MCG (1000 UNIT) tablet, Take 1,000 Units by mouth daily., Disp: , Rfl:    furosemide (LASIX) 20 MG tablet, Take 1 tablet (20 mg total) by mouth daily., Disp: 60 tablet, Rfl: 2   glucose blood (ONE TOUCH ULTRA TEST) test strip, USE TO  GLUCOSE ONCE DAILY, Disp: 100 each, Rfl: 3 °  isosorbide dinitrate (ISORDIL) 10 MG tablet, Place 1 tablet (10 mg total) into feeding tube 2 (two) times daily., Disp: , Rfl:  °  nitroGLYCERIN (NITROSTAT) 0.4 MG SL tablet, Place 0.4 mg under the tongue every 5 (five) minutes as needed for chest pain., Disp: , Rfl:  °  OXYGEN, Inhale 3 L into the lungs at bedtime., Disp: , Rfl:  ° °Observations/Objective: °Patient is well-developed, well-nourished in no acute distress.  °Resting comfortably  at home.  °Head is normocephalic, atraumatic.  °No labored breathing.  °Speech is clear and coherent with logical content.  °Patient is alert and oriented at baseline.  °Wearing oxygen  °Lymphedema in arms greatly improved °Eating chips ° ° ° °Assessment and Plan: °1. Congestive heart failure, unspecified HF chronicity, unspecified heart failure type (HCC) °- CMP14+EGFR; Future °- CBC with Differential/Platelet; Future °- Brain natriuretic peptide; Future ° °2. Anxiety °- ALPRAZolam (XANAX) 0.5 MG tablet; Take 1 tablet (0.5 mg total) by mouth 2 (two) times daily as needed. for anxiety  Dispense: 60 tablet; Refill: 2 °- CMP14+EGFR; Future °- CBC  with Differential/Platelet; Future ° °3. Benzodiazepine dependence (HCC) °- ALPRAZolam (XANAX) 0.5 MG tablet; Take 1 tablet (0.5 mg total) by mouth 2 (two) times daily as needed. for anxiety  Dispense: 60 tablet; Refill: 2 °- CMP14+EGFR; Future °- CBC with Differential/Platelet; Future ° °4. Essential hypertension °- CMP14+EGFR; Future °- CBC with Differential/Platelet; Future ° °5. Chronic respiratory failure with hypoxia (HCC) °- CMP14+EGFR; Future °- CBC with Differential/Platelet; Future ° °6. Hospital discharge follow-up °- CMP14+EGFR; Future °- CBC with Differential/Platelet; Future ° °7. Weakness °- CMP14+EGFR; Future °- CBC with Differential/Platelet; Future ° °8. Type 2 diabetes mellitus with diabetic polyneuropathy, without long-term current use of insulin (HCC) °- blood glucose meter kit and supplies; Dispense based on patient and insurance preference. Use up to four times daily as directed. (FOR ICD-10 E10.9, E11.9).  Dispense: 1 each; Refill: 0 °- CMP14+EGFR; Future °- CBC with Differential/Platelet; Future °- [employer] DCA Hb A1c Waived; Future ° °9. Controlled substance agreement signed °- CMP14+EGFR; Future °- CBC with Differential/Platelet; Future ° °10. Chronic bronchitis, unspecified chronic bronchitis type (HCC) °- budesonide (PULMICORT) 0.5 MG/2ML nebulizer solution; Take 2 mLs (0.5 mg total) by nebulization 2 (two) times daily.  Dispense: 100 mL; Refill: 12 ° °Will order labs - Will get home health to draw °Continue medications °Follow up Cardiologists and Pulmonologist's °Patient reviewed in [place] controlled database, no flags noted. Contract and drug screen are up to date.  °  ° °Follow Up Instructions: °I discussed the assessment and treatment plan with the patient. The patient was provided an opportunity to ask questions and all were answered. The patient agreed with the plan and demonstrated an understanding of the instructions.  A copy of instructions were sent to the patient via MyChart unless  otherwise noted below.  ° ° ° °The patient was advised to call back or seek an in-person evaluation if the symptoms worsen or if the condition fails to improve as anticipated. ° °Time:  °I spent 42 minutes with the patient via telehealth technology discussing the above problems/concerns and chart review and documenting..   ° ° , FNP ° °

## 2021-07-06 DIAGNOSIS — J439 Emphysema, unspecified: Secondary | ICD-10-CM | POA: Diagnosis not present

## 2021-07-08 ENCOUNTER — Ambulatory Visit: Payer: Medicare HMO | Admitting: Family

## 2021-07-08 DIAGNOSIS — J441 Chronic obstructive pulmonary disease with (acute) exacerbation: Secondary | ICD-10-CM | POA: Diagnosis not present

## 2021-07-08 DIAGNOSIS — G9341 Metabolic encephalopathy: Secondary | ICD-10-CM | POA: Diagnosis not present

## 2021-07-08 DIAGNOSIS — I110 Hypertensive heart disease with heart failure: Secondary | ICD-10-CM | POA: Diagnosis not present

## 2021-07-08 DIAGNOSIS — J9602 Acute respiratory failure with hypercapnia: Secondary | ICD-10-CM | POA: Diagnosis not present

## 2021-07-08 DIAGNOSIS — R69 Illness, unspecified: Secondary | ICD-10-CM | POA: Diagnosis not present

## 2021-07-08 DIAGNOSIS — I2510 Atherosclerotic heart disease of native coronary artery without angina pectoris: Secondary | ICD-10-CM | POA: Diagnosis not present

## 2021-07-08 DIAGNOSIS — E46 Unspecified protein-calorie malnutrition: Secondary | ICD-10-CM | POA: Diagnosis not present

## 2021-07-08 DIAGNOSIS — Z431 Encounter for attention to gastrostomy: Secondary | ICD-10-CM | POA: Diagnosis not present

## 2021-07-08 DIAGNOSIS — K922 Gastrointestinal hemorrhage, unspecified: Secondary | ICD-10-CM | POA: Diagnosis not present

## 2021-07-08 DIAGNOSIS — I5030 Unspecified diastolic (congestive) heart failure: Secondary | ICD-10-CM | POA: Diagnosis not present

## 2021-07-09 DIAGNOSIS — J441 Chronic obstructive pulmonary disease with (acute) exacerbation: Secondary | ICD-10-CM | POA: Diagnosis not present

## 2021-07-09 DIAGNOSIS — I5030 Unspecified diastolic (congestive) heart failure: Secondary | ICD-10-CM | POA: Diagnosis not present

## 2021-07-09 DIAGNOSIS — E46 Unspecified protein-calorie malnutrition: Secondary | ICD-10-CM | POA: Diagnosis not present

## 2021-07-09 DIAGNOSIS — Z431 Encounter for attention to gastrostomy: Secondary | ICD-10-CM | POA: Diagnosis not present

## 2021-07-09 DIAGNOSIS — I110 Hypertensive heart disease with heart failure: Secondary | ICD-10-CM | POA: Diagnosis not present

## 2021-07-09 DIAGNOSIS — K922 Gastrointestinal hemorrhage, unspecified: Secondary | ICD-10-CM | POA: Diagnosis not present

## 2021-07-09 DIAGNOSIS — J9602 Acute respiratory failure with hypercapnia: Secondary | ICD-10-CM | POA: Diagnosis not present

## 2021-07-09 DIAGNOSIS — G9341 Metabolic encephalopathy: Secondary | ICD-10-CM | POA: Diagnosis not present

## 2021-07-09 DIAGNOSIS — R69 Illness, unspecified: Secondary | ICD-10-CM | POA: Diagnosis not present

## 2021-07-09 DIAGNOSIS — I2510 Atherosclerotic heart disease of native coronary artery without angina pectoris: Secondary | ICD-10-CM | POA: Diagnosis not present

## 2021-07-10 ENCOUNTER — Inpatient Hospital Stay (HOSPITAL_COMMUNITY): Payer: Medicare HMO

## 2021-07-10 ENCOUNTER — Emergency Department (HOSPITAL_COMMUNITY): Payer: Medicare HMO

## 2021-07-10 ENCOUNTER — Inpatient Hospital Stay (HOSPITAL_COMMUNITY)
Admission: EM | Admit: 2021-07-10 | Discharge: 2021-07-25 | DRG: 207 | Disposition: A | Discharge status: Home Health | Payer: Medicare HMO | Attending: Internal Medicine | Admitting: Internal Medicine

## 2021-07-10 ENCOUNTER — Encounter (HOSPITAL_COMMUNITY): Payer: Self-pay

## 2021-07-10 ENCOUNTER — Other Ambulatory Visit: Payer: Self-pay

## 2021-07-10 DIAGNOSIS — A419 Sepsis, unspecified organism: Secondary | ICD-10-CM | POA: Diagnosis present

## 2021-07-10 DIAGNOSIS — Z8249 Family history of ischemic heart disease and other diseases of the circulatory system: Secondary | ICD-10-CM

## 2021-07-10 DIAGNOSIS — E782 Mixed hyperlipidemia: Secondary | ICD-10-CM | POA: Diagnosis present

## 2021-07-10 DIAGNOSIS — Z955 Presence of coronary angioplasty implant and graft: Secondary | ICD-10-CM

## 2021-07-10 DIAGNOSIS — E46 Unspecified protein-calorie malnutrition: Secondary | ICD-10-CM | POA: Diagnosis not present

## 2021-07-10 DIAGNOSIS — I4892 Unspecified atrial flutter: Secondary | ICD-10-CM | POA: Diagnosis not present

## 2021-07-10 DIAGNOSIS — J918 Pleural effusion in other conditions classified elsewhere: Secondary | ICD-10-CM | POA: Diagnosis present

## 2021-07-10 DIAGNOSIS — I471 Supraventricular tachycardia: Secondary | ICD-10-CM | POA: Diagnosis present

## 2021-07-10 DIAGNOSIS — I5033 Acute on chronic diastolic (congestive) heart failure: Secondary | ICD-10-CM | POA: Diagnosis not present

## 2021-07-10 DIAGNOSIS — J189 Pneumonia, unspecified organism: Secondary | ICD-10-CM

## 2021-07-10 DIAGNOSIS — R911 Solitary pulmonary nodule: Secondary | ICD-10-CM | POA: Diagnosis present

## 2021-07-10 DIAGNOSIS — I480 Paroxysmal atrial fibrillation: Secondary | ICD-10-CM | POA: Diagnosis present

## 2021-07-10 DIAGNOSIS — I472 Ventricular tachycardia, unspecified: Secondary | ICD-10-CM | POA: Diagnosis not present

## 2021-07-10 DIAGNOSIS — I9589 Other hypotension: Secondary | ICD-10-CM | POA: Diagnosis not present

## 2021-07-10 DIAGNOSIS — G931 Anoxic brain damage, not elsewhere classified: Secondary | ICD-10-CM | POA: Diagnosis not present

## 2021-07-10 DIAGNOSIS — J181 Lobar pneumonia, unspecified organism: Secondary | ICD-10-CM | POA: Diagnosis not present

## 2021-07-10 DIAGNOSIS — J9600 Acute respiratory failure, unspecified whether with hypoxia or hypercapnia: Secondary | ICD-10-CM | POA: Diagnosis not present

## 2021-07-10 DIAGNOSIS — Z9911 Dependence on respirator [ventilator] status: Secondary | ICD-10-CM

## 2021-07-10 DIAGNOSIS — I2510 Atherosclerotic heart disease of native coronary artery without angina pectoris: Secondary | ICD-10-CM | POA: Diagnosis not present

## 2021-07-10 DIAGNOSIS — J90 Pleural effusion, not elsewhere classified: Secondary | ICD-10-CM | POA: Diagnosis not present

## 2021-07-10 DIAGNOSIS — J9621 Acute and chronic respiratory failure with hypoxia: Secondary | ICD-10-CM | POA: Diagnosis not present

## 2021-07-10 DIAGNOSIS — I890 Lymphedema, not elsewhere classified: Secondary | ICD-10-CM | POA: Diagnosis present

## 2021-07-10 DIAGNOSIS — E11649 Type 2 diabetes mellitus with hypoglycemia without coma: Secondary | ICD-10-CM | POA: Diagnosis not present

## 2021-07-10 DIAGNOSIS — J9622 Acute and chronic respiratory failure with hypercapnia: Secondary | ICD-10-CM | POA: Diagnosis present

## 2021-07-10 DIAGNOSIS — J9601 Acute respiratory failure with hypoxia: Secondary | ICD-10-CM

## 2021-07-10 DIAGNOSIS — E119 Type 2 diabetes mellitus without complications: Secondary | ICD-10-CM

## 2021-07-10 DIAGNOSIS — I21A1 Myocardial infarction type 2: Secondary | ICD-10-CM | POA: Diagnosis not present

## 2021-07-10 DIAGNOSIS — Z801 Family history of malignant neoplasm of trachea, bronchus and lung: Secondary | ICD-10-CM

## 2021-07-10 DIAGNOSIS — F05 Delirium due to known physiological condition: Secondary | ICD-10-CM | POA: Diagnosis not present

## 2021-07-10 DIAGNOSIS — J111 Influenza due to unidentified influenza virus with other respiratory manifestations: Secondary | ICD-10-CM | POA: Diagnosis not present

## 2021-07-10 DIAGNOSIS — E1165 Type 2 diabetes mellitus with hyperglycemia: Secondary | ICD-10-CM | POA: Diagnosis present

## 2021-07-10 DIAGNOSIS — J9690 Respiratory failure, unspecified, unspecified whether with hypoxia or hypercapnia: Secondary | ICD-10-CM | POA: Diagnosis not present

## 2021-07-10 DIAGNOSIS — I10 Essential (primary) hypertension: Secondary | ICD-10-CM | POA: Diagnosis not present

## 2021-07-10 DIAGNOSIS — J441 Chronic obstructive pulmonary disease with (acute) exacerbation: Secondary | ICD-10-CM | POA: Diagnosis present

## 2021-07-10 DIAGNOSIS — R0602 Shortness of breath: Secondary | ICD-10-CM | POA: Diagnosis not present

## 2021-07-10 DIAGNOSIS — E7800 Pure hypercholesterolemia, unspecified: Secondary | ICD-10-CM | POA: Diagnosis not present

## 2021-07-10 DIAGNOSIS — R000 Tachycardia, unspecified: Secondary | ICD-10-CM | POA: Diagnosis not present

## 2021-07-10 DIAGNOSIS — R570 Cardiogenic shock: Secondary | ICD-10-CM | POA: Diagnosis not present

## 2021-07-10 DIAGNOSIS — F419 Anxiety disorder, unspecified: Secondary | ICD-10-CM | POA: Diagnosis present

## 2021-07-10 DIAGNOSIS — Z7189 Other specified counseling: Secondary | ICD-10-CM | POA: Diagnosis not present

## 2021-07-10 DIAGNOSIS — E1142 Type 2 diabetes mellitus with diabetic polyneuropathy: Secondary | ICD-10-CM

## 2021-07-10 DIAGNOSIS — J386 Stenosis of larynx: Secondary | ICD-10-CM | POA: Diagnosis present

## 2021-07-10 DIAGNOSIS — Z825 Family history of asthma and other chronic lower respiratory diseases: Secondary | ICD-10-CM

## 2021-07-10 DIAGNOSIS — R599 Enlarged lymph nodes, unspecified: Secondary | ICD-10-CM | POA: Diagnosis not present

## 2021-07-10 DIAGNOSIS — R069 Unspecified abnormalities of breathing: Secondary | ICD-10-CM | POA: Diagnosis not present

## 2021-07-10 DIAGNOSIS — F17210 Nicotine dependence, cigarettes, uncomplicated: Secondary | ICD-10-CM | POA: Diagnosis present

## 2021-07-10 DIAGNOSIS — G9340 Encephalopathy, unspecified: Secondary | ICD-10-CM | POA: Diagnosis not present

## 2021-07-10 DIAGNOSIS — Z515 Encounter for palliative care: Secondary | ICD-10-CM | POA: Diagnosis not present

## 2021-07-10 DIAGNOSIS — I700 Atherosclerosis of aorta: Secondary | ICD-10-CM | POA: Diagnosis not present

## 2021-07-10 DIAGNOSIS — E440 Moderate protein-calorie malnutrition: Secondary | ICD-10-CM | POA: Diagnosis not present

## 2021-07-10 DIAGNOSIS — Z4682 Encounter for fitting and adjustment of non-vascular catheter: Secondary | ICD-10-CM | POA: Diagnosis not present

## 2021-07-10 DIAGNOSIS — E872 Acidosis, unspecified: Secondary | ICD-10-CM | POA: Diagnosis present

## 2021-07-10 DIAGNOSIS — N179 Acute kidney failure, unspecified: Secondary | ICD-10-CM | POA: Diagnosis not present

## 2021-07-10 DIAGNOSIS — F1320 Sedative, hypnotic or anxiolytic dependence, uncomplicated: Secondary | ICD-10-CM | POA: Diagnosis present

## 2021-07-10 DIAGNOSIS — Z743 Need for continuous supervision: Secondary | ICD-10-CM | POA: Diagnosis not present

## 2021-07-10 DIAGNOSIS — R0902 Hypoxemia: Secondary | ICD-10-CM

## 2021-07-10 DIAGNOSIS — T380X5A Adverse effect of glucocorticoids and synthetic analogues, initial encounter: Secondary | ICD-10-CM | POA: Diagnosis present

## 2021-07-10 DIAGNOSIS — Z823 Family history of stroke: Secondary | ICD-10-CM

## 2021-07-10 DIAGNOSIS — J9602 Acute respiratory failure with hypercapnia: Secondary | ICD-10-CM

## 2021-07-10 DIAGNOSIS — K922 Gastrointestinal hemorrhage, unspecified: Secondary | ICD-10-CM | POA: Diagnosis not present

## 2021-07-10 DIAGNOSIS — I5030 Unspecified diastolic (congestive) heart failure: Secondary | ICD-10-CM | POA: Diagnosis not present

## 2021-07-10 DIAGNOSIS — R5381 Other malaise: Secondary | ICD-10-CM | POA: Diagnosis present

## 2021-07-10 DIAGNOSIS — K219 Gastro-esophageal reflux disease without esophagitis: Secondary | ICD-10-CM | POA: Diagnosis present

## 2021-07-10 DIAGNOSIS — I110 Hypertensive heart disease with heart failure: Secondary | ICD-10-CM | POA: Diagnosis not present

## 2021-07-10 DIAGNOSIS — E1140 Type 2 diabetes mellitus with diabetic neuropathy, unspecified: Secondary | ICD-10-CM | POA: Diagnosis present

## 2021-07-10 DIAGNOSIS — J439 Emphysema, unspecified: Secondary | ICD-10-CM | POA: Diagnosis not present

## 2021-07-10 DIAGNOSIS — Z7901 Long term (current) use of anticoagulants: Secondary | ICD-10-CM

## 2021-07-10 DIAGNOSIS — Z20822 Contact with and (suspected) exposure to covid-19: Secondary | ICD-10-CM | POA: Diagnosis not present

## 2021-07-10 DIAGNOSIS — I3139 Other pericardial effusion (noninflammatory): Secondary | ICD-10-CM | POA: Diagnosis not present

## 2021-07-10 DIAGNOSIS — Z9889 Other specified postprocedural states: Secondary | ICD-10-CM

## 2021-07-10 DIAGNOSIS — I252 Old myocardial infarction: Secondary | ICD-10-CM

## 2021-07-10 DIAGNOSIS — R918 Other nonspecific abnormal finding of lung field: Secondary | ICD-10-CM | POA: Diagnosis not present

## 2021-07-10 DIAGNOSIS — Z978 Presence of other specified devices: Secondary | ICD-10-CM

## 2021-07-10 DIAGNOSIS — Z79899 Other long term (current) drug therapy: Secondary | ICD-10-CM

## 2021-07-10 DIAGNOSIS — R609 Edema, unspecified: Secondary | ICD-10-CM

## 2021-07-10 DIAGNOSIS — I517 Cardiomegaly: Secondary | ICD-10-CM | POA: Diagnosis not present

## 2021-07-10 DIAGNOSIS — E785 Hyperlipidemia, unspecified: Secondary | ICD-10-CM | POA: Diagnosis present

## 2021-07-10 DIAGNOSIS — I447 Left bundle-branch block, unspecified: Secondary | ICD-10-CM | POA: Diagnosis not present

## 2021-07-10 DIAGNOSIS — Z806 Family history of leukemia: Secondary | ICD-10-CM

## 2021-07-10 DIAGNOSIS — Z9981 Dependence on supplemental oxygen: Secondary | ICD-10-CM

## 2021-07-10 DIAGNOSIS — I509 Heart failure, unspecified: Secondary | ICD-10-CM | POA: Diagnosis not present

## 2021-07-10 DIAGNOSIS — Z6825 Body mass index (BMI) 25.0-25.9, adult: Secondary | ICD-10-CM

## 2021-07-10 DIAGNOSIS — G9341 Metabolic encephalopathy: Secondary | ICD-10-CM | POA: Diagnosis not present

## 2021-07-10 DIAGNOSIS — Z9289 Personal history of other medical treatment: Secondary | ICD-10-CM

## 2021-07-10 DIAGNOSIS — R0689 Other abnormalities of breathing: Secondary | ICD-10-CM | POA: Diagnosis not present

## 2021-07-10 DIAGNOSIS — E871 Hypo-osmolality and hyponatremia: Secondary | ICD-10-CM | POA: Diagnosis present

## 2021-07-10 DIAGNOSIS — J690 Pneumonitis due to inhalation of food and vomit: Secondary | ICD-10-CM | POA: Diagnosis not present

## 2021-07-10 DIAGNOSIS — R69 Illness, unspecified: Secondary | ICD-10-CM | POA: Diagnosis not present

## 2021-07-10 DIAGNOSIS — Z431 Encounter for attention to gastrostomy: Secondary | ICD-10-CM | POA: Diagnosis not present

## 2021-07-10 DIAGNOSIS — J449 Chronic obstructive pulmonary disease, unspecified: Secondary | ICD-10-CM | POA: Diagnosis not present

## 2021-07-10 DIAGNOSIS — Z8673 Personal history of transient ischemic attack (TIA), and cerebral infarction without residual deficits: Secondary | ICD-10-CM

## 2021-07-10 LAB — CBC WITH DIFFERENTIAL/PLATELET
Abs Immature Granulocytes: 0.08 10*3/uL — ABNORMAL HIGH (ref 0.00–0.07)
Basophils Absolute: 0.1 10*3/uL (ref 0.0–0.1)
Basophils Relative: 1 %
Eosinophils Absolute: 0.2 10*3/uL (ref 0.0–0.5)
Eosinophils Relative: 2 %
HCT: 40.5 % (ref 39.0–52.0)
Hemoglobin: 12.8 g/dL — ABNORMAL LOW (ref 13.0–17.0)
Immature Granulocytes: 1 %
Lymphocytes Relative: 10 %
Lymphs Abs: 1.2 10*3/uL (ref 0.7–4.0)
MCH: 28.8 pg (ref 26.0–34.0)
MCHC: 31.6 g/dL (ref 30.0–36.0)
MCV: 91 fL (ref 80.0–100.0)
Monocytes Absolute: 1.2 10*3/uL — ABNORMAL HIGH (ref 0.1–1.0)
Monocytes Relative: 10 %
Neutro Abs: 9.5 10*3/uL — ABNORMAL HIGH (ref 1.7–7.7)
Neutrophils Relative %: 76 %
Platelets: 391 10*3/uL (ref 150–400)
RBC: 4.45 MIL/uL (ref 4.22–5.81)
RDW: 14.2 % (ref 11.5–15.5)
WBC: 12.3 10*3/uL — ABNORMAL HIGH (ref 4.0–10.5)
nRBC: 0 % (ref 0.0–0.2)

## 2021-07-10 LAB — COMPREHENSIVE METABOLIC PANEL
ALT: 12 U/L (ref 0–44)
AST: 17 U/L (ref 15–41)
Albumin: 2.3 g/dL — ABNORMAL LOW (ref 3.5–5.0)
Alkaline Phosphatase: 82 U/L (ref 38–126)
Anion gap: 8 (ref 5–15)
BUN: 14 mg/dL (ref 8–23)
CO2: 26 mmol/L (ref 22–32)
Calcium: 7.4 mg/dL — ABNORMAL LOW (ref 8.9–10.3)
Chloride: 98 mmol/L (ref 98–111)
Creatinine, Ser: 1.07 mg/dL (ref 0.61–1.24)
GFR, Estimated: >60 mL/min (ref >60)
Glucose, Bld: 220 mg/dL — ABNORMAL HIGH (ref 70–99)
Potassium: 3.9 mmol/L (ref 3.5–5.1)
Sodium: 132 mmol/L — ABNORMAL LOW (ref 135–145)
Total Bilirubin: 0.7 mg/dL (ref 0.3–1.2)
Total Protein: 5.2 g/dL — ABNORMAL LOW (ref 6.5–8.1)

## 2021-07-10 LAB — BLOOD GAS, ARTERIAL
Acid-Base Excess: 1.6 mmol/L (ref 0.0–2.0)
Acid-base deficit: 2.2 mmol/L — ABNORMAL HIGH (ref 0.0–2.0)
Bicarbonate: 18.2 mmol/L — ABNORMAL LOW (ref 20.0–28.0)
Bicarbonate: 24.5 mmol/L (ref 20.0–28.0)
Drawn by: 21310
Drawn by: 41977
FIO2: 100
FIO2: 100
O2 Saturation: 84.1 %
O2 Saturation: 96.8 %
Patient temperature: 36.8
Patient temperature: 36.8
pCO2 arterial: 107 mmHg (ref 32.0–48.0)
pCO2 arterial: 62.5 mmHg — ABNORMAL HIGH (ref 32.0–48.0)
pH, Arterial: 7.042 — CL (ref 7.350–7.450)
pH, Arterial: 7.27 — ABNORMAL LOW (ref 7.350–7.450)
pO2, Arterial: 72.3 mmHg — ABNORMAL LOW (ref 83.0–108.0)
pO2, Arterial: 109 mmHg — ABNORMAL HIGH (ref 83.0–108.0)

## 2021-07-10 LAB — BASIC METABOLIC PANEL
Anion gap: 3 — ABNORMAL LOW (ref 5–15)
BUN: 13 mg/dL (ref 8–23)
CO2: 31 mmol/L (ref 22–32)
Calcium: 7.8 mg/dL — ABNORMAL LOW (ref 8.9–10.3)
Chloride: 98 mmol/L (ref 98–111)
Creatinine, Ser: 1.07 mg/dL (ref 0.61–1.24)
GFR, Estimated: >60 mL/min (ref >60)
Glucose, Bld: 177 mg/dL — ABNORMAL HIGH (ref 70–99)
Potassium: 4.3 mmol/L (ref 3.5–5.1)
Sodium: 132 mmol/L — ABNORMAL LOW (ref 135–145)

## 2021-07-10 LAB — POCT I-STAT 7, (LYTES, BLD GAS, ICA,H+H)
Acid-base deficit: 4 mmol/L — ABNORMAL HIGH (ref 0.0–2.0)
Bicarbonate: 26.8 mmol/L (ref 20.0–28.0)
Calcium, Ion: 1.2 mmol/L (ref 1.15–1.40)
HCT: 41 % (ref 39.0–52.0)
Hemoglobin: 13.9 g/dL (ref 13.0–17.0)
O2 Saturation: 97 %
Patient temperature: 96.8
Potassium: 4.8 mmol/L (ref 3.5–5.1)
Sodium: 133 mmol/L — ABNORMAL LOW (ref 135–145)
TCO2: 29 mmol/L (ref 22–32)
pCO2 arterial: 77.3 mmHg (ref 32.0–48.0)
pH, Arterial: 7.143 — CL (ref 7.350–7.450)
pO2, Arterial: 118 mmHg — ABNORMAL HIGH (ref 83.0–108.0)

## 2021-07-10 LAB — URINALYSIS, MICROSCOPIC (REFLEX)
Squamous Epithelial / HPF: NONE SEEN (ref 0–5)
WBC, UA: NONE SEEN WBC/hpf (ref 0–5)

## 2021-07-10 LAB — URINALYSIS, ROUTINE W REFLEX MICROSCOPIC
Bilirubin Urine: NEGATIVE
Glucose, UA: 100 mg/dL — AB
Ketones, ur: NEGATIVE mg/dL
Leukocytes,Ua: NEGATIVE
Nitrite: NEGATIVE
Protein, ur: 30 mg/dL — AB
Specific Gravity, Urine: >1.03 — ABNORMAL HIGH (ref 1.005–1.030)
pH: 5.5 (ref 5.0–8.0)

## 2021-07-10 LAB — LACTIC ACID, PLASMA
Lactic Acid, Venous: 3.9 mmol/L (ref 0.5–1.9)
Lactic Acid, Venous: 3.9 mmol/L (ref 0.5–1.9)
Lactic Acid, Venous: 3.5 mmol/L (ref 0.5–1.9)

## 2021-07-10 LAB — GLUCOSE, CAPILLARY
Glucose-Capillary: 136 mg/dL — ABNORMAL HIGH (ref 70–99)
Glucose-Capillary: 175 mg/dL — ABNORMAL HIGH (ref 70–99)
Glucose-Capillary: 189 mg/dL — ABNORMAL HIGH (ref 70–99)
Glucose-Capillary: 221 mg/dL — ABNORMAL HIGH (ref 70–99)

## 2021-07-10 LAB — TROPONIN I (HIGH SENSITIVITY)
Troponin I (High Sensitivity): 37 ng/L — ABNORMAL HIGH (ref <18)
Troponin I (High Sensitivity): 49 ng/L — ABNORMAL HIGH (ref <18)
Troponin I (High Sensitivity): 74 ng/L — ABNORMAL HIGH (ref <18)

## 2021-07-10 LAB — RESP PANEL BY RT-PCR (FLU A&B, COVID) ARPGX2
Influenza A by PCR: NEGATIVE
Influenza B by PCR: NEGATIVE
SARS Coronavirus 2 by RT PCR: NEGATIVE

## 2021-07-10 LAB — PROTIME-INR
INR: 1.4 — ABNORMAL HIGH (ref 0.8–1.2)
Prothrombin Time: 16.7 seconds — ABNORMAL HIGH (ref 11.4–15.2)

## 2021-07-10 LAB — C-REACTIVE PROTEIN: CRP: 2.4 mg/dL — ABNORMAL HIGH (ref <1.0)

## 2021-07-10 LAB — SEDIMENTATION RATE: Sed Rate: 12 mm/hr (ref 0–16)

## 2021-07-10 LAB — MRSA NEXT GEN BY PCR, NASAL: MRSA by PCR Next Gen: NOT DETECTED

## 2021-07-10 LAB — BRAIN NATRIURETIC PEPTIDE: B Natriuretic Peptide: 536 pg/mL — ABNORMAL HIGH (ref 0.0–100.0)

## 2021-07-10 LAB — APTT: aPTT: 34 seconds (ref 24–36)

## 2021-07-10 LAB — PROCALCITONIN: Procalcitonin: <0.1 ng/mL

## 2021-07-10 MED ORDER — IPRATROPIUM BROMIDE 0.02 % IN SOLN
RESPIRATORY_TRACT | Status: AC
  Filled 2021-07-10: qty 2.5

## 2021-07-10 MED ORDER — PHENYLEPHRINE 40 MCG/ML (10ML) SYRINGE FOR IV PUSH (FOR BLOOD PRESSURE SUPPORT)
PREFILLED_SYRINGE | INTRAVENOUS | Status: AC
  Filled 2021-07-10: qty 10

## 2021-07-10 MED ORDER — CHLORHEXIDINE GLUCONATE CLOTH 2 % EX PADS
6.0000 | MEDICATED_PAD | Freq: Every day | CUTANEOUS | Status: DC
  Administered 2021-07-10 – 2021-07-25 (×16): 6 via TOPICAL

## 2021-07-10 MED ORDER — IPRATROPIUM BROMIDE 0.02 % IN SOLN
0.5000 mg | Freq: Four times a day (QID) | RESPIRATORY_TRACT | Status: DC
  Administered 2021-07-10: 0.5 mg via RESPIRATORY_TRACT

## 2021-07-10 MED ORDER — VANCOMYCIN HCL 2000 MG/400ML IV SOLN
2000.0000 mg | Freq: Once | INTRAVENOUS | Status: AC
  Administered 2021-07-10: 2000 mg via INTRAVENOUS
  Filled 2021-07-10: qty 400

## 2021-07-10 MED ORDER — PROPOFOL 1000 MG/100ML IV EMUL
5.0000 ug/kg/min | INTRAVENOUS | Status: DC
  Administered 2021-07-10: 5 ug/kg/min via INTRAVENOUS
  Filled 2021-07-10: qty 100

## 2021-07-10 MED ORDER — AMIODARONE HCL IN DEXTROSE 360-4.14 MG/200ML-% IV SOLN
60.0000 mg/h | INTRAVENOUS | Status: DC
  Administered 2021-07-10: 60 mg/h via INTRAVENOUS
  Filled 2021-07-10: qty 200

## 2021-07-10 MED ORDER — REVEFENACIN 175 MCG/3ML IN SOLN
175.0000 ug | Freq: Every day | RESPIRATORY_TRACT | Status: DC
  Administered 2021-07-10 – 2021-07-25 (×15): 175 ug via RESPIRATORY_TRACT
  Filled 2021-07-10 (×16): qty 3

## 2021-07-10 MED ORDER — FUROSEMIDE 40 MG PO TABS: 40.0000 mg | ORAL_TABLET | Freq: Every day | ORAL | Status: DC

## 2021-07-10 MED ORDER — METHYLPREDNISOLONE SODIUM SUCC 125 MG IJ SOLR
80.0000 mg | Freq: Three times a day (TID) | INTRAMUSCULAR | Status: AC
  Administered 2021-07-10 – 2021-07-11 (×4): 80 mg via INTRAVENOUS
  Filled 2021-07-10 (×4): qty 2

## 2021-07-10 MED ORDER — SODIUM CHLORIDE 0.9 % IV SOLN
100.0000 mg | Freq: Two times a day (BID) | INTRAVENOUS | Status: DC
  Filled 2021-07-10 (×3): qty 100

## 2021-07-10 MED ORDER — FUROSEMIDE 10 MG/ML IJ SOLN
60.0000 mg | Freq: Once | INTRAMUSCULAR | Status: AC
  Administered 2021-07-10: 60 mg via INTRAVENOUS
  Filled 2021-07-10: qty 6

## 2021-07-10 MED ORDER — APIXABAN 5 MG PO TABS: 5.0000 mg | ORAL_TABLET | Freq: Two times a day (BID) | ORAL | Status: DC

## 2021-07-10 MED ORDER — BISACODYL 5 MG PO TBEC: 5.0000 mg | DELAYED_RELEASE_TABLET | Freq: Every day | ORAL | Status: DC | PRN

## 2021-07-10 MED ORDER — LACTATED RINGERS IV BOLUS (SEPSIS)
1000.0000 mL | Freq: Once | INTRAVENOUS | Status: AC
  Administered 2021-07-10: 1000 mL via INTRAVENOUS

## 2021-07-10 MED ORDER — VANCOMYCIN HCL 750 MG/150ML IV SOLN
750.0000 mg | Freq: Two times a day (BID) | INTRAVENOUS | Status: DC
  Filled 2021-07-10 (×2): qty 150

## 2021-07-10 MED ORDER — INSULIN ASPART 100 UNIT/ML IJ SOLN
2.0000 [IU] | INTRAMUSCULAR | Status: DC
  Administered 2021-07-10: 4 [IU] via SUBCUTANEOUS
  Administered 2021-07-10 (×2): 2 [IU] via SUBCUTANEOUS
  Administered 2021-07-10: 6 [IU] via SUBCUTANEOUS
  Administered 2021-07-11 (×2): 4 [IU] via SUBCUTANEOUS
  Administered 2021-07-11 (×2): 2 [IU] via SUBCUTANEOUS
  Administered 2021-07-11: 6 [IU] via SUBCUTANEOUS
  Administered 2021-07-11: 4 [IU] via SUBCUTANEOUS
  Administered 2021-07-12 (×2): 6 [IU] via SUBCUTANEOUS
  Administered 2021-07-12 (×2): 4 [IU] via SUBCUTANEOUS
  Administered 2021-07-12 – 2021-07-13 (×2): 6 [IU] via SUBCUTANEOUS
  Administered 2021-07-13 (×2): 4 [IU] via SUBCUTANEOUS

## 2021-07-10 MED ORDER — AMIODARONE HCL 200 MG PO TABS: 200.0000 mg | ORAL_TABLET | Freq: Every day | ORAL | Status: DC

## 2021-07-10 MED ORDER — LEVALBUTEROL HCL 0.63 MG/3ML IN NEBU
0.6300 mg | INHALATION_SOLUTION | Freq: Four times a day (QID) | RESPIRATORY_TRACT | Status: DC | PRN
  Administered 2021-07-10: 0.63 mg via RESPIRATORY_TRACT
  Filled 2021-07-10: qty 3

## 2021-07-10 MED ORDER — ATORVASTATIN CALCIUM 40 MG PO TABS
40.0000 mg | ORAL_TABLET | Freq: Every day | ORAL | Status: DC
  Administered 2021-07-10 – 2021-07-24 (×15): 40 mg
  Filled 2021-07-10 (×15): qty 1

## 2021-07-10 MED ORDER — ASPIRIN 81 MG PO CHEW
81.0000 mg | CHEWABLE_TABLET | Freq: Every day | ORAL | Status: DC
  Administered 2021-07-10 – 2021-07-24 (×15): 81 mg
  Filled 2021-07-10 (×15): qty 1

## 2021-07-10 MED ORDER — ISOSORBIDE MONONITRATE 10 MG PO TABS
10.0000 mg | ORAL_TABLET | Freq: Two times a day (BID) | ORAL | Status: DC
  Filled 2021-07-10 (×5): qty 1

## 2021-07-10 MED ORDER — SENNOSIDES-DOCUSATE SODIUM 8.6-50 MG PO TABS: 1.0000 | ORAL_TABLET | Freq: Every evening | ORAL | Status: DC | PRN

## 2021-07-10 MED ORDER — SODIUM CHLORIDE 0.9% FLUSH
3.0000 mL | Freq: Two times a day (BID) | INTRAVENOUS | Status: DC
  Administered 2021-07-10 – 2021-07-16 (×7): 3 mL via INTRAVENOUS

## 2021-07-10 MED ORDER — ROCURONIUM BROMIDE 10 MG/ML (PF) SYRINGE
PREFILLED_SYRINGE | INTRAVENOUS | Status: AC
  Administered 2021-07-10: 100 mg via INTRAVENOUS
  Filled 2021-07-10: qty 10

## 2021-07-10 MED ORDER — ROCURONIUM BROMIDE 50 MG/5ML IV SOLN: 100.0000 mg | Freq: Once | INTRAVENOUS | Status: AC

## 2021-07-10 MED ORDER — VANCOMYCIN HCL 1500 MG/300ML IV SOLN: 1500.0000 mg | INTRAVENOUS | Status: DC

## 2021-07-10 MED ORDER — PANTOPRAZOLE SODIUM 40 MG IV SOLR
40.0000 mg | INTRAVENOUS | Status: DC
  Administered 2021-07-10 – 2021-07-12 (×3): 40 mg via INTRAVENOUS
  Filled 2021-07-10 (×3): qty 40

## 2021-07-10 MED ORDER — LEVALBUTEROL HCL 0.63 MG/3ML IN NEBU
0.6300 mg | INHALATION_SOLUTION | RESPIRATORY_TRACT | Status: DC | PRN
  Administered 2021-07-10 – 2021-07-17 (×12): 0.63 mg via RESPIRATORY_TRACT
  Filled 2021-07-10 (×13): qty 3

## 2021-07-10 MED ORDER — PANTOPRAZOLE SODIUM 40 MG PO TBEC: 40.0000 mg | DELAYED_RELEASE_TABLET | Freq: Every day | ORAL | Status: DC

## 2021-07-10 MED ORDER — SODIUM CHLORIDE 0.9% FLUSH: 3.0000 mL | INTRAVENOUS | Status: DC | PRN

## 2021-07-10 MED ORDER — LACTATED RINGERS IV SOLN: INTRAVENOUS | Status: DC

## 2021-07-10 MED ORDER — ONDANSETRON HCL 4 MG/2ML IJ SOLN: 4.0000 mg | Freq: Four times a day (QID) | INTRAMUSCULAR | Status: DC | PRN

## 2021-07-10 MED ORDER — FENTANYL CITRATE PF 50 MCG/ML IJ SOSY
PREFILLED_SYRINGE | INTRAMUSCULAR | Status: AC
  Filled 2021-07-10: qty 2

## 2021-07-10 MED ORDER — FENTANYL 2500MCG IN NS 250ML (10MCG/ML) PREMIX INFUSION
0.0000 ug/h | INTRAVENOUS | Status: DC
  Administered 2021-07-10: 175 ug/h via INTRAVENOUS
  Administered 2021-07-10: 25 ug/h via INTRAVENOUS
  Administered 2021-07-11: 225 ug/h via INTRAVENOUS
  Administered 2021-07-11: 150 ug/h via INTRAVENOUS
  Administered 2021-07-12: 50 ug/h via INTRAVENOUS
  Administered 2021-07-13: 200 ug/h via INTRAVENOUS
  Administered 2021-07-13 – 2021-07-14 (×2): 300 ug/h via INTRAVENOUS
  Filled 2021-07-10 (×6): qty 250

## 2021-07-10 MED ORDER — BUDESONIDE 0.5 MG/2ML IN SUSP
0.5000 mg | Freq: Two times a day (BID) | RESPIRATORY_TRACT | Status: DC
  Administered 2021-07-10: 0.5 mg via RESPIRATORY_TRACT
  Filled 2021-07-10: qty 2

## 2021-07-10 MED ORDER — CHLORHEXIDINE GLUCONATE 0.12% ORAL RINSE (MEDLINE KIT)
15.0000 mL | Freq: Two times a day (BID) | OROMUCOSAL | Status: DC
  Administered 2021-07-10 – 2021-07-15 (×11): 15 mL via OROMUCOSAL

## 2021-07-10 MED ORDER — MIDAZOLAM HCL 2 MG/2ML IJ SOLN
INTRAMUSCULAR | Status: AC
  Filled 2021-07-10: qty 2

## 2021-07-10 MED ORDER — IPRATROPIUM BROMIDE 0.02 % IN SOLN: 0.5000 mg | Freq: Four times a day (QID) | RESPIRATORY_TRACT | Status: DC | PRN

## 2021-07-10 MED ORDER — FENTANYL 2500MCG IN NS 250ML (10MCG/ML) PREMIX INFUSION
0.0000 ug/h | INTRAVENOUS | Status: DC
  Administered 2021-07-10 (×2): 25 ug/h via INTRAVENOUS
  Filled 2021-07-10: qty 250

## 2021-07-10 MED ORDER — ACETAMINOPHEN 650 MG RE SUPP: 650.0000 mg | Freq: Four times a day (QID) | RECTAL | Status: DC | PRN

## 2021-07-10 MED ORDER — FENTANYL 2500MCG IN NS 250ML (10MCG/ML) PREMIX INFUSION
INTRAVENOUS | Status: AC
  Filled 2021-07-10: qty 250

## 2021-07-10 MED ORDER — ORAL CARE MOUTH RINSE
15.0000 mL | OROMUCOSAL | Status: DC
  Administered 2021-07-10 – 2021-07-15 (×51): 15 mL via OROMUCOSAL

## 2021-07-10 MED ORDER — ONDANSETRON HCL 4 MG PO TABS: 4.0000 mg | ORAL_TABLET | Freq: Four times a day (QID) | ORAL | Status: DC | PRN

## 2021-07-10 MED ORDER — HEPARIN (PORCINE) 25000 UT/250ML-% IV SOLN
1450.0000 [IU]/h | INTRAVENOUS | Status: DC
  Administered 2021-07-10: 18:00:00 1250 [IU]/h via INTRAVENOUS
  Administered 2021-07-11: 1450 [IU]/h via INTRAVENOUS
  Filled 2021-07-10 (×2): qty 250

## 2021-07-10 MED ORDER — KETAMINE HCL 50 MG/5ML IJ SOSY
PREFILLED_SYRINGE | INTRAMUSCULAR | Status: AC
  Filled 2021-07-10: qty 5

## 2021-07-10 MED ORDER — ETOMIDATE 2 MG/ML IV SOLN: 20.0000 mg | Freq: Once | INTRAVENOUS | Status: AC

## 2021-07-10 MED ORDER — SUCCINYLCHOLINE CHLORIDE 200 MG/10ML IV SOSY: 100.0000 mg | PREFILLED_SYRINGE | Freq: Once | INTRAVENOUS | Status: DC

## 2021-07-10 MED ORDER — LORAZEPAM 2 MG/ML IJ SOLN
1.0000 mg | Freq: Once | INTRAMUSCULAR | Status: AC
  Administered 2021-07-10: 1 mg via INTRAVENOUS
  Filled 2021-07-10: qty 1

## 2021-07-10 MED ORDER — ETOMIDATE 2 MG/ML IV SOLN
INTRAVENOUS | Status: AC
  Administered 2021-07-10: 20 mg via INTRAVENOUS
  Filled 2021-07-10: qty 20

## 2021-07-10 MED ORDER — VANCOMYCIN HCL IN DEXTROSE 1-5 GM/200ML-% IV SOLN: 1000.0000 mg | Freq: Once | INTRAVENOUS | Status: DC

## 2021-07-10 MED ORDER — SODIUM CHLORIDE 0.9 % IV SOLN
250.0000 mL | INTRAVENOUS | Status: DC | PRN
  Administered 2021-07-10: 250 mL via INTRAVENOUS

## 2021-07-10 MED ORDER — SUCCINYLCHOLINE CHLORIDE 200 MG/10ML IV SOSY
PREFILLED_SYRINGE | INTRAVENOUS | Status: AC
  Filled 2021-07-10: qty 10

## 2021-07-10 MED ORDER — HYDROMORPHONE HCL 1 MG/ML IJ SOLN: 0.5000 mg | INTRAMUSCULAR | Status: DC | PRN

## 2021-07-10 MED ORDER — TRAZODONE HCL 50 MG PO TABS: 25.0000 mg | ORAL_TABLET | Freq: Every evening | ORAL | Status: DC | PRN

## 2021-07-10 MED ORDER — INSULIN ASPART 100 UNIT/ML IJ SOLN
2.0000 [IU] | INTRAMUSCULAR | Status: DC
  Administered 2021-07-10 – 2021-07-11 (×5): 2 [IU] via SUBCUTANEOUS

## 2021-07-10 MED ORDER — HEPARIN SODIUM (PORCINE) 5000 UNIT/ML IJ SOLN: 5000.0000 [IU] | Freq: Three times a day (TID) | INTRAMUSCULAR | Status: DC

## 2021-07-10 MED ORDER — AMIODARONE LOAD VIA INFUSION
150.0000 mg | Freq: Once | INTRAVENOUS | Status: DC
  Filled 2021-07-10: qty 83.34

## 2021-07-10 MED ORDER — SODIUM CHLORIDE 0.9% FLUSH: 10.0000 mL | INTRAVENOUS | Status: DC | PRN

## 2021-07-10 MED ORDER — ACETAMINOPHEN 325 MG PO TABS: 650.0000 mg | ORAL_TABLET | Freq: Four times a day (QID) | ORAL | Status: DC | PRN

## 2021-07-10 MED ORDER — NOREPINEPHRINE 4 MG/250ML-% IV SOLN
0.0000 ug/min | INTRAVENOUS | Status: DC
  Filled 2021-07-10: qty 250

## 2021-07-10 MED ORDER — PROSOURCE TF PO LIQD
45.0000 mL | Freq: Two times a day (BID) | ORAL | Status: DC
  Administered 2021-07-10 – 2021-07-11 (×2): 45 mL
  Filled 2021-07-10 (×2): qty 45

## 2021-07-10 MED ORDER — VITAL HIGH PROTEIN PO LIQD
1000.0000 mL | ORAL | Status: DC
  Administered 2021-07-10 (×2): 1000 mL

## 2021-07-10 MED ORDER — SODIUM CHLORIDE 0.9 % IV SOLN: 500.0000 mg | INTRAVENOUS | Status: DC

## 2021-07-10 MED ORDER — ENOXAPARIN SODIUM 40 MG/0.4ML IJ SOSY
40.0000 mg | PREFILLED_SYRINGE | INTRAMUSCULAR | Status: DC
  Administered 2021-07-10: 40 mg via SUBCUTANEOUS
  Filled 2021-07-10: qty 0.4

## 2021-07-10 MED ORDER — OXYCODONE HCL 5 MG PO TABS: 5.0000 mg | ORAL_TABLET | ORAL | Status: DC | PRN

## 2021-07-10 MED ORDER — SODIUM CHLORIDE 0.9 % IV SOLN: INTRAVENOUS | Status: DC

## 2021-07-10 MED ORDER — ALBUTEROL SULFATE (2.5 MG/3ML) 0.083% IN NEBU
10.0000 mg | INHALATION_SOLUTION | Freq: Once | RESPIRATORY_TRACT | Status: AC
  Administered 2021-07-10: 10 mg via RESPIRATORY_TRACT
  Filled 2021-07-10: qty 12

## 2021-07-10 MED ORDER — AMIODARONE HCL IN DEXTROSE 360-4.14 MG/200ML-% IV SOLN: 30.0000 mg/h | INTRAVENOUS | Status: DC

## 2021-07-10 MED ORDER — NICOTINE 14 MG/24HR TD PT24: 14.0000 mg | MEDICATED_PATCH | Freq: Every day | TRANSDERMAL | Status: DC

## 2021-07-10 MED ORDER — SODIUM CHLORIDE 0.9% FLUSH
10.0000 mL | Freq: Two times a day (BID) | INTRAVENOUS | Status: DC
  Administered 2021-07-10 – 2021-07-14 (×8): 10 mL

## 2021-07-10 MED ORDER — SODIUM CHLORIDE 0.9 % IV SOLN
2.0000 g | Freq: Once | INTRAVENOUS | Status: DC
  Administered 2021-07-10: 2 g via INTRAVENOUS
  Filled 2021-07-10: qty 2

## 2021-07-10 MED ORDER — METOPROLOL TARTRATE 25 MG PO TABS: 75.0000 mg | ORAL_TABLET | Freq: Two times a day (BID) | ORAL | Status: DC

## 2021-07-10 MED ORDER — ATORVASTATIN CALCIUM 40 MG PO TABS: 40.0000 mg | ORAL_TABLET | Freq: Every evening | ORAL | Status: DC

## 2021-07-10 MED ORDER — FENTANYL CITRATE PF 50 MCG/ML IJ SOSY
100.0000 ug | PREFILLED_SYRINGE | Freq: Once | INTRAMUSCULAR | Status: AC
  Administered 2021-07-10: 100 ug via INTRAVENOUS
  Filled 2021-07-10: qty 2

## 2021-07-10 MED ORDER — FENTANYL CITRATE PF 50 MCG/ML IJ SOSY
200.0000 ug | PREFILLED_SYRINGE | Freq: Once | INTRAMUSCULAR | Status: AC
  Administered 2021-07-10: 200 ug via INTRAVENOUS
  Filled 2021-07-10: qty 4

## 2021-07-10 MED ORDER — ALBUTEROL (5 MG/ML) CONTINUOUS INHALATION SOLN: 10.0000 mg/h | INHALATION_SOLUTION | Freq: Once | RESPIRATORY_TRACT | Status: DC

## 2021-07-10 MED ORDER — SODIUM CHLORIDE 0.9% FLUSH
3.0000 mL | Freq: Two times a day (BID) | INTRAVENOUS | Status: DC
  Administered 2021-07-11 – 2021-07-25 (×18): 3 mL via INTRAVENOUS

## 2021-07-10 MED ORDER — SODIUM CHLORIDE 0.9 % IV SOLN
2.0000 g | Freq: Three times a day (TID) | INTRAVENOUS | Status: DC
  Administered 2021-07-10 – 2021-07-11 (×3): 2 g via INTRAVENOUS
  Filled 2021-07-10 (×2): qty 2

## 2021-07-10 MED ORDER — ARFORMOTEROL TARTRATE 15 MCG/2ML IN NEBU
15.0000 ug | INHALATION_SOLUTION | Freq: Two times a day (BID) | RESPIRATORY_TRACT | Status: DC
  Administered 2021-07-10 – 2021-07-25 (×30): 15 ug via RESPIRATORY_TRACT
  Filled 2021-07-10 (×33): qty 2

## 2021-07-10 MED ORDER — FENTANYL BOLUS VIA INFUSION
50.0000 ug | INTRAVENOUS | Status: DC | PRN
  Administered 2021-07-11 – 2021-07-14 (×12): 50 ug via INTRAVENOUS
  Filled 2021-07-10: qty 50

## 2021-07-10 MED ORDER — METRONIDAZOLE 500 MG/100ML IV SOLN
500.0000 mg | Freq: Two times a day (BID) | INTRAVENOUS | Status: DC
  Administered 2021-07-10 – 2021-07-11 (×3): 500 mg via INTRAVENOUS
  Filled 2021-07-10 (×3): qty 100

## 2021-07-10 MED ORDER — HYDRALAZINE HCL 20 MG/ML IJ SOLN: 10.0000 mg | INTRAMUSCULAR | Status: DC | PRN

## 2021-07-10 MED ORDER — LACTATED RINGERS IV BOLUS (SEPSIS): 1000.0000 mL | Freq: Once | INTRAVENOUS | Status: DC

## 2021-07-10 NOTE — Progress Notes (Addendum)
ANTICOAGULATION CONSULT NOTE - Initial Consult  Pharmacy Consult for IV Heparin Indication: atrial fibrillation  Allergies  Allergen Reactions   Gabapentin Anxiety    Unknown reaction   Metformin And Related Rash    Patient Measurements: Height: 5\' 9"  (175.3 cm) Weight: 88 kg (194 lb 0.1 oz) IBW/kg (Calculated) : 70.7 Heparin Dosing Weight: 88 kg  Vital Signs: Temp: 98.7 F (37.1 C) (02/01 1529) Temp Source: Oral (02/01 1529) BP: 105/60 (02/01 1600) Pulse Rate: 63 (02/01 1600)  Labs: Recent Labs    07/10/21 0324 07/10/21 0739 07/10/21 0908 07/10/21 1321  HGB 12.8*  --   --   --   HCT 40.5  --   --   --   PLT 391  --   --   --   APTT  --  34  --   --   LABPROT  --  16.7*  --   --   INR  --  1.4*  --   --   CREATININE 1.07  --  1.07  --   TROPONINIHS 37*  --  49* 74*    Estimated Creatinine Clearance: 67.5 mL/min (by C-G formula based on SCr of 1.07 mg/dL).   Medical History: Past Medical History:  Diagnosis Date   Anxiety    Asthma    Atrial flutter ([place]) 04/2021   Chronic lower back pain    COPD (chronic obstructive pulmonary disease) (HCC)    Coronary artery disease    a. NSTEMI 05/2014 s/p [facility]x2 [facility].   Depression    Educated about COVID-19 virus infection 03/06/2020   GERD (gastroesophageal reflux disease)    High cholesterol    Hypertension    NSTEMI (non-ST elevated myocardial infarction) ([place]) 05/2014   with stent placement   Sleep apnea    Stroke ([place]) 2017   [email or web address]    TIA (transient ischemic attack)    "they say I've had some mini strokes; don't know when"; denies residual on 06/22/2014)   Type II diabetes mellitus ([place])    Ulcerative colitis [facility])     Assessment: 76 yr old man admitted as transfer from [facility] with acute hypoxic and  hypercapnic vent-dependent respiratory failure and atrial tachycardia requiring cardioversion (wide-complex irregular tachy, possibly a fib with aberrancy). Phamacy is consulted to  dose IV heparin (per med rec, pt was taking apixaban PTA, with last dose at 2100 on 07/09/21). Given recent apixaban exposure, will monitor anticoagulation using aPTT until aPTT and heparin levels correlate. H/H 12.8/40.5, plt 391, CrCl 67.5 ml/min; INR 1.4, aPTT 34 sec.  Pt rec'd dose of enoxaparin 40 mg SQ at 0940 AM today.  Goal of Therapy:  Heparin level 0.3-0.7 units/ml aPTT: 66-102 sec Monitor platelets by anticoagulation protocol: Yes   Plan:  Start IV heparin (no bolus) at 1250 units/hr Check aPTT/heparin level in ~7-8 hrs Monitor daily aPTT, heparin level, CBC Monitor for bleeding  Gillermina Hu, PharmD, BCPS, [facility] Clinical Pharmacist 07/10/2021,5:06 PM

## 2021-07-10 NOTE — Assessment & Plan Note (Signed)
Patient remain hypoxic and respiratory failure on BiPAP in ED ABG: 7.042/PCO2 107/PO2 72.3/bicarb 18.2  Patient was intubated in ICU, currently on the vent PRVC 26/570/16 0.1/PEEP of  5

## 2021-07-10 NOTE — ED Notes (Signed)
Pt to CT

## 2021-07-10 NOTE — ED Notes (Addendum)
5:29 - Epi given  5:30- pt went into V-tach 0532-  Synched and Dfib at1 20j by EDP Pt went into sinus tach @ rate of 130 BP 52/39

## 2021-07-10 NOTE — ED Notes (Signed)
Pt is out of soft restraints at this time

## 2021-07-10 NOTE — H&P (Signed)
History and Physical    Patient: Ronald [name]:[id number] DOB: 1946/01/09 DOA: 07/10/2021 DOS: the patient was seen and examined on 07/10/2021 PCP: [name], FNP  Patient coming from: Home  Chief Complaint:  Chief Complaint  Patient presents with   Shortness of Breath    HPI: Ronald [name] is a 76 y.o. male with medical history significant of COPD O2 dependent 3 L at home, DM2, CHF, HTN, HLD, smoker, CAD, MI, etc.... Presented to ED with chief complaint of shortness of breath, hypoxia.  Patient was found in acute respiratory failure, hypoxic in ED, subsequently became encephalopathic, patient was subsequently intubated, airway was secured, central line was placed.  Around 5:28 AM patient had episode of V. tach, epinephrine and was cardioverted with synchronized defibrillator x1... Patient converted to sinus tach at a rate of 130, blood pressure was as low as 52/39  Chest x-ray consistent with pneumonia, right-sided inflammation, questionable early signs of ARDS PCCM at [facility] Dr. Patsey Berthold was consulted.  Patient has responded well to IV fluid resuscitation, broad-spectrum antibiotics Cultures have been obtained.  Patient remains full code   Review of Systems: unable to review all systems due to the inability of the patient to answer questions. Past Medical History:  Diagnosis Date   Anxiety    Asthma    Chronic lower back pain    COPD (chronic obstructive pulmonary disease) (HCC)    Coronary artery disease    a. NSTEMI 05/2014 s/p DESx2 to LAD at [facility]   Depression    Educated about COVID-19 virus infection 03/06/2020   GERD (gastroesophageal reflux disease)    High cholesterol    Hypertension    NSTEMI (non-ST elevated myocardial infarction) ([place]) 05/2014   with stent placement   Sleep apnea    Stroke ([place]) 2017   anyeusym    TIA (transient ischemic attack)    "they say I've had some mini strokes; don't know when"; denies residual on 06/22/2014)   Type II  diabetes mellitus ([place])    Ulcerative colitis ([place])    Past Surgical History:  Procedure Laterality Date   APPENDECTOMY     BIOPSY  07/20/2020   Procedure: BIOPSY;  Surgeon: Harvel Quale, MD;  [facility];  Service: Gastroenterology;;   CARDIAC CATHETERIZATION  [phone] X 3   CHOLECYSTECTOMY     COLONOSCOPY WITH PROPOFOL N/A 07/20/2020   Procedure: COLONOSCOPY WITH PROPOFOL;  Surgeon: Harvel Quale, MD;  [facility];  Service: Gastroenterology;  Laterality: N/A;  1:15   CORONARY ANGIOPLASTY WITH STENT PLACEMENT  05/2014   "2"   ESOPHAGEAL DILATION N/A 07/20/2020   Procedure: ESOPHAGEAL DILATION;  Surgeon: Harvel Quale, MD;  [facility];  Service: Gastroenterology;  Laterality: N/A;   ESOPHAGOGASTRODUODENOSCOPY (EGD) WITH PROPOFOL N/A 07/20/2020   Procedure: ESOPHAGOGASTRODUODENOSCOPY (EGD) WITH PROPOFOL;  Surgeon: Harvel Quale, MD;  [facility];  Service: Gastroenterology;  Laterality: N/A;   IR GASTROSTOMY TUBE MOD SED  06/04/2021   LEFT HEART CATH AND CORONARY ANGIOGRAPHY N/A 05/25/2020   Procedure: LEFT HEART CATH AND CORONARY ANGIOGRAPHY;  Surgeon: Martinique, Peter M, MD;  Location: [facility];  Service: Cardiovascular;  Laterality: N/A;   POLYPECTOMY  07/20/2020   Procedure: POLYPECTOMY INTESTINAL;  Surgeon: Harvel Quale, MD;  Location[facility];  Service: Gastroenterology;;   TUMOR EXCISION Right ~ 1999   "side of my upper head"   Social History:  reports that he has  been smoking cigarettes. He started smoking about 55 years ago. He has a 72.00 pack-year smoking history. He has never used smokeless tobacco. He reports that he does not currently use alcohol. He reports that he does not use drugs.  Allergies  Allergen Reactions   Gabapentin Anxiety    Unknown reaction   Metformin And Related Rash    Family History  Problem Relation Age of Onset   CAD Father     Lung cancer Brother        smoked   Cancer Brother        lung   Leukemia Sister    Dementia Sister    Stroke Mother    Emphysema Sister    Cancer Brother        lung    Prior to Admission medications   Medication Sig Start Date End Date Taking? Authorizing Provider  acetaminophen (TYLENOL) 325 MG tablet Place 2 tablets (650 mg total) into feeding tube every 4 (four) hours as needed for mild pain (temp > 101.5). 06/06/21  Yes Elgergawy, Silver Huguenin, MD  albuterol (VENTOLIN HFA) 108 (90 Base) MCG/ACT inhaler INHALE 2 PUFFS EVERY 6 HOURS AS NEEEDED Patient taking differently: Inhale 2 puffs into the lungs every 6 (six) hours as needed for wheezing or shortness of breath. 11/02/19  Yes Hawks, Christy A, FNP  ALPRAZolam (XANAX) 0.5 MG tablet Take 1 tablet (0.5 mg total) by mouth 2 (two) times daily as needed. for anxiety 07/05/21  Yes Hawks, Christy A, FNP  amiodarone (PACERONE) 200 MG tablet Take 200 mg by mouth daily. 07/03/21  Yes [provider]  Ascorbic Acid (VITAMIN C) 1000 MG tablet Take 1,000 mg by mouth daily.   Yes [provider]  atorvastatin (LIPITOR) 40 MG tablet TAKE 1 TABLET BY MOUTH ONCE DAILY IN THE EVENING Patient taking differently: Take 40 mg by mouth every evening. 12/05/20  Yes Hawks, Theador Hawthorne, FNP  blood glucose meter kit and supplies Dispense based on patient and insurance preference. Use up to four times daily as directed. (FOR ICD-10 E10.9, E11.9). 07/05/21  Yes Hawks, Alyse Low A, FNP  Blood Glucose Monitoring Suppl (ACCU-CHEK GUIDE ME) w/Device KIT Check BS daily Dx E11.9 05/16/20  Yes Hawks, Christy A, FNP  cholecalciferol (VITAMIN D3) 25 MCG (1000 UNIT) tablet Take 1,000 Units by mouth daily.   Yes [provider]  ELIQUIS 5 MG TABS tablet Take 5 mg by mouth 2 (two) times daily. 07/03/21  Yes [provider]  furosemide (LASIX) 40 MG tablet Take 40 mg by mouth daily. 07/03/21  Yes [provider]  glucose blood (ONE TOUCH ULTRA  TEST) test strip USE TO CHECK GLUCOSE ONCE DAILY 02/11/18  Yes Terald Sleeper, PA-C  ipratropium-albuterol (DUONEB) 0.5-2.5 (3) MG/3ML SOLN Take 3 mLs by nebulization every 6 (six) hours as needed (asthma).   Yes [provider]  isosorbide mononitrate (ISMO) 10 MG tablet Take 10 mg by mouth 2 (two) times daily. 07/03/21  Yes [provider]  Metoprolol Tartrate 75 MG TABS Take 1 tablet by mouth 2 (two) times daily. 07/05/21  Yes [provider]  nicotine (NICODERM CQ - DOSED IN MG/24 HOURS) 14 mg/24hr patch Place 14 mg onto the skin daily.   Yes [provider]  nitroGLYCERIN (NITROSTAT) 0.4 MG SL tablet Place 0.4 mg under the tongue every 5 (five) minutes as needed for chest pain.   Yes [provider]  omeprazole (PRILOSEC) 20 MG capsule Take 20 mg by  mouth daily. 07/05/21  Yes [provider]  OXYGEN Inhale 3 L into the lungs at bedtime.   Yes [provider]  budesonide (PULMICORT) 0.5 MG/2ML nebulizer solution Take 2 mLs (0.5 mg total) by nebulization 2 (two) times daily. Patient not taking: Reported on 07/10/2021 07/05/21   [name] A, FNP  mirtazapine (REMERON) 15 MG tablet Take 15 mg by mouth at bedtime. Patient not taking: Reported on 07/10/2021 06/26/21   [provider]    Physical Exam: Vitals:   07/10/21 0740 07/10/21 0745 07/10/21 0800 07/10/21 0815  BP: (!) 135/59 (!) 116/54 (!) 108/55 (!) 104/52  Pulse: 94 89 91 85  Resp:   16 16  Temp:      SpO2: 100% 94% 97% 92%  Weight:      Height:          Physical Exam:   General:  Sedated intubated  HEENT:  Encephalopathic, ET tube in place  Neuro:  Limited exam patient is intubated, sedated  Lungs:   Intubated positive air sounds bilaterally,  Cardio:    S1/S2, irregularly irregular, no murmure, No Rubs or Gallops   Abdomen:   Soft, non-tender, bowel sounds active all four quadrants,  peritoneal signs.  Muscular skeletal:  Sedated, Limited exam -+2 pulses  +1 pitting edema  Skin:  Dry, warm to touch, negative for any Rashes,  Wounds: Please see nursing documentation          Data Reviewed: All current data and imaging were reviewed Results are pending, will review when available.  Assessment and Plan:  Principal Problem:   Acute respiratory failure (HCC) Active Problems:   Sepsis due to pneumonia ([place])   Coronary artery disease   Diabetes mellitus ([place])   COPD exacerbation ([place])   Acute encephalopathy   Essential hypertension   GERD (gastroesophageal reflux disease)   Diabetic neuropathy (HCC)   Benzodiazepine dependence ([place])   Hyperlipidemia   Atrial flutter (HCC)       Acute hypercapnic respiratory failure ([place]) in the setting of COPD exacerbation, pneumonia, sepsis -ruling out ARDS -Patient remain hypoxic and respiratory failure on BiPAP in ED -ABG: 7.042/PCO2 107/PO2 72.3/bicarb 18.2  - Patient was intubated in ICU, currently on the vent PRVC 26/570/16 0.1/PEEP of  5  -Patient will be admitted to [facility] ICU, to critical care team Overnight Dr. Patsey Berthold in this morning Dr. Mertha Finders was consulted, PCCM attending Dr. Carlis Abbott at [facility] was notified -High-dose steroids Solu-Medrol initiated  Sepsis likely to pneumonia-COPD, hypoxic -resulting in acute respiratory failure -Imaging reviewed -Elevated lactic acid 3.9, WBC 12.3, hypotensive (BP as low as 52/39 currently 104/52) -Per sepsis pathway aggressive IV fluid resuscitation with LR -Pancultured -Patient started on broad-spectrum antibiotics vancomycin and cefepime and Flagyl Continue broad-spectrum antibiotics  Brief episode of V. Tach -Patient was given 1 dose of epi, cardioverted with synchronized defibrillator patient briefly went to sinus tach currently in A. Fib... Heart rate stable now at 85  History of A. Fib -Once NG tube was successfully placed, resuming home medication of Eliquis -If unsuccessful will initiate heparin drip (not ordered yet) -Once stable resuming  amiodarone via NG tube (considering IV)  Diabetes mellitus type 2 -Initiating CBG every 4 hours, with Isai coverage -Obtaining A1c   Coronary artery disease -Once patient stable, resuming home medication including statins,  COPD exacerbation (HCC) -baseline dependent 3 L, current management as above    Acute encephalopathy -likely due to sepsis, hypoxia, successfully intubated,    Essential hypertension  -  Holding home medication, as needed hydralazine   GERD (gastroesophageal reflux disease) Protonix by NG tube (considering IV)   Diabetic neuropathy (HCC)-but holding p.o. meds    Benzodiazepine dependence ([place]) -As needed Ativan   Hyperlipidemia -We will resume statins   Advance Care Planning:   Code Status: Full Code   Consults: PCCM (Dr. Patsey Berthold overnight, Dr. Melvyn Novas this AM) Patient will be admitted to ICU at [facility] under DR. Clark Dr. Carlis Abbott was notified  Family Communication:   Severity of Illness: The appropriate patient status for this patient is INPATIENT. Inpatient status is judged to be reasonable and necessary in order to provide the required intensity of service to ensure the patient's safety. The patient's presenting symptoms, physical exam findings, and initial radiographic and laboratory data in the context of their chronic comorbidities is felt to place them at high risk for further clinical deterioration. Furthermore, it is not anticipated that the patient will be medically stable for discharge from the hospital within 2 midnights of admission.   * I certify that at the point of admission it is my clinical judgment that the patient will require inpatient hospital care spanning beyond 2 midnights from the point of admission due to high intensity of service, high risk for further deterioration and high frequency of surveillance required.*  Author: Deatra James, MD 07/10/2021 8:30 AM       Time spent: > than  10  Min.  Of critical time was spent  admitting this patient, reviewing all labs, overnight findings procedures, treating patient for sepsis, addressing acute respiratory failure with critical care team and ED providers (Reviewing electronic records,)  SIGNED: Deatra James, MD, [facility]. [facility],  Pager (Please use [email or web address] to page to text)  If 7PM-7AM, please contact night-coverage [email or web address],  07/10/2021, 8:30 AM

## 2021-07-10 NOTE — ED Notes (Signed)
Lab was unable to draw second lactic due to difficulty accessing veins

## 2021-07-10 NOTE — Progress Notes (Signed)
[facility] Progress Note Patient Name: Ronald [name] DOB: August 10, 1945 MRN: [id number]   Date of Service  07/10/2021  HPI/Events of Note  Agitation - Patient already on a Fentanyl IV infusion. Nursing request for Fentanyl boluses PRN.  eICU Interventions  Plan: Fentanyl 50 mcg IV bolus from infusion Q 1 hour PRN pain or sedation.     Intervention Category Major Interventions: Delirium, psychosis, severe agitation - evaluation and management  Coulter Oldaker Eugene 07/10/2021, 10:02 PM

## 2021-07-10 NOTE — ED Provider Notes (Addendum)
[facility] EMERGENCY DEPARTMENT Provider Note   CSN: [id number] Arrival date & time: 07/10/21  0256     History  Chief Complaint  Patient presents with   Shortness of Breath    DARDAN SHELTON is a 76 y.o. male.  Patient presents to the emergency department for evaluation of shortness of breath.  Patient has a history of COPD.  Patient reports that he started having increased shortness of breath with a mild cough earlier today.  No associated chest pain.  Patient used a DuoNeb and 3 puffs of his rescue inhaler prior to EMS arrival and reports improvement.      Home Medications Prior to Admission medications   Medication Sig Start Date End Date Taking? Authorizing Provider  acetaminophen (TYLENOL) 325 MG tablet Place 2 tablets (650 mg total) into feeding tube every 4 (four) hours as needed for mild pain (temp > 101.5). 06/06/21   Elgergawy, Silver Huguenin, MD  albuterol (VENTOLIN HFA) 108 (90 Base) MCG/ACT inhaler INHALE 2 PUFFS EVERY 6 HOURS AS NEEEDED Patient taking differently: Inhale 2 puffs into the lungs every 6 (six) hours as needed for wheezing or shortness of breath. 11/02/19   [name], FNP  ALPRAZolam Duanne Moron) 0.5 MG tablet Take 1 tablet (0.5 mg total) by mouth 2 (two) times daily as needed. for anxiety 07/05/21   [name], FNP  amiodarone (PACERONE) 100 MG tablet Place 1 tablet (100 mg total) into feeding tube daily. 06/07/21   Elgergawy, Silver Huguenin, MD  apixaban (ELIQUIS) 2.5 MG TABS tablet Place 1 tablet (2.5 mg total) into feeding tube 2 (two) times daily. 06/06/21   Elgergawy, Silver Huguenin, MD  Ascorbic Acid (VITAMIN C) 1000 MG tablet Take 1,000 mg by mouth daily.    [provider]  atorvastatin (LIPITOR) 40 MG tablet TAKE 1 TABLET BY MOUTH ONCE DAILY IN THE EVENING Patient taking differently: Take 40 mg by mouth every evening. 12/05/20   [name], FNP  blood glucose meter kit and supplies Dispense based on patient and insurance preference. Use up to  four times daily as directed. (FOR ICD-10 E10.9, E11.9). 07/05/21   [name], FNP  Blood Glucose Monitoring Suppl (ACCU-CHEK GUIDE ME) w/Device KIT Check BS daily Dx E11.9 05/16/20   [name] A, FNP  budesonide (PULMICORT) 0.5 MG/2ML nebulizer solution Take 2 mLs (0.5 mg total) by nebulization 2 (two) times daily. 07/05/21   [name] A, FNP  cholecalciferol (VITAMIN D3) 25 MCG (1000 UNIT) tablet Take 1,000 Units by mouth daily.    [provider]  furosemide (LASIX) 20 MG tablet Take 1 tablet (20 mg total) by mouth daily. 06/06/21   Elgergawy, Silver Huguenin, MD  glucose blood (ONE TOUCH ULTRA TEST) test strip USE TO CHECK GLUCOSE ONCE DAILY 02/11/18   Terald Sleeper, PA-C  isosorbide dinitrate (ISORDIL) 10 MG tablet Place 1 tablet (10 mg total) into feeding tube 2 (two) times daily. 06/06/21   Elgergawy, Silver Huguenin, MD  nitroGLYCERIN (NITROSTAT) 0.4 MG SL tablet Place 0.4 mg under the tongue every 5 (five) minutes as needed for chest pain.    [provider]  omeprazole (PRILOSEC OTC) 20 MG tablet Take 1 tablet (20 mg total) by mouth daily. 07/05/21   [name] A, FNP  OXYGEN Inhale 3 L into the lungs at bedtime.    [provider]      Allergies    Gabapentin and Metformin and related    Review of Systems  Review of Systems  Respiratory:  Positive for shortness of breath.    Physical Exam Updated Vital Signs BP (!) 91/52    Pulse 94    Temp 98.3 F (36.8 C)    Resp (!) 26    Ht 5' 9"  (1.753 m)    Wt 84 kg    SpO2 96%    BMI 27.35 kg/m  Physical Exam Vitals and nursing note reviewed.  Constitutional:      General: He is not in acute distress.    Appearance: He is well-developed.  HENT:     Head: Normocephalic and atraumatic.  Eyes:     Conjunctiva/sclera: Conjunctivae normal.  Cardiovascular:     Rate and Rhythm: Normal rate and regular rhythm.     Heart sounds: No murmur heard. Pulmonary:     Effort: Pulmonary effort is normal. No  respiratory distress.     Breath sounds: Decreased breath sounds present.  Abdominal:     Palpations: Abdomen is soft.     Tenderness: There is no abdominal tenderness.  Musculoskeletal:        General: No swelling.     Cervical back: Neck supple.  Skin:    General: Skin is warm and dry.     Capillary Refill: Capillary refill takes less than 2 seconds.  Neurological:     Mental Status: He is alert.  Psychiatric:        Mood and Affect: Mood normal.    ED Results / Procedures / Treatments   Labs (all labs ordered are listed, but only abnormal results are displayed) Labs Reviewed  CBC WITH DIFFERENTIAL/PLATELET - Abnormal; Notable for the following components:      Result Value   WBC 12.3 (*)    Hemoglobin 12.8 (*)    Neutro Abs 9.5 (*)    Monocytes Absolute 1.2 (*)    Abs Immature Granulocytes 0.08 (*)    All other components within normal limits  BASIC METABOLIC PANEL - Abnormal; Notable for the following components:   Sodium 132 (*)    Glucose, Bld 177 (*)    Calcium 7.8 (*)    Anion gap 3 (*)    All other components within normal limits  BRAIN NATRIURETIC PEPTIDE - Abnormal; Notable for the following components:   B Natriuretic Peptide 536.0 (*)    All other components within normal limits  BLOOD GAS, ARTERIAL - Abnormal; Notable for the following components:   pH, Arterial 7.042 (*)    pCO2 arterial 107 (*)    pO2, Arterial 72.3 (*)    Bicarbonate 18.2 (*)    Acid-base deficit 2.2 (*)    All other components within normal limits  TROPONIN I (HIGH SENSITIVITY) - Abnormal; Notable for the following components:   Troponin I (High Sensitivity) 37 (*)    All other components within normal limits  RESP PANEL BY RT-PCR (FLU A&B, COVID) ARPGX2  CULTURE, BLOOD (SINGLE)  TROPONIN I (HIGH SENSITIVITY)    EKG EKG Interpretation  Date/Time:  Wednesday July 10 2021 04:12:02 EST Ventricular Rate:  174 PR Interval:  243 QRS Duration: 133 QT Interval:  244 QTC  Calculation: 336 R Axis:   24 Text Interpretation: Wide-QRS tachycardia likely aberrhently conducted AFIB (irregular) Paired ventricular premature complexes Left bundle branch block Confirmed by Orpah Greek [phone]) on 07/10/2021 6:57:08 AM   EKG Interpretation  Date/Time:  Wednesday July 10 2021 05:26:34 EST Ventricular Rate:  95 PR Interval:  243 QRS Duration: 194 QT Interval:  441 QTC Calculation: 555 R Axis:   91 Text Interpretation: Atrial fibrillation RBBB and LPFB ST depression, consider ischemia, diffuse lds Baseline wander in lead(s) II aVR Confirmed by Orpah Greek [phone]) on 07/10/2021 6:58:11 AM         Radiology DG Chest 1V REPEAT Same Day  Result Date: 07/10/2021 CLINICAL DATA:  77 year old male intubated. EXAM: CHEST - 1 VIEW SAME DAY COMPARISON:  Portable chest 0352 hours today and earlier. FINDINGS: Portable AP supine view at 0451 hours. Endotracheal tube tip in good position between the level the clavicles and carina. Enteric tube courses to the stomach, tip not included. Pacer or resuscitation pads now project over the central chest. Stable cardiac size and mediastinal contours. Progressed and now widespread reticulonodular opacity in the right mid and lower lung. Underlying pulmonary hyperinflation with emphysema. No pneumothorax. No pleural effusion. IMPRESSION: 1. Endotracheal tube tip in good position. Enteric tube courses to the stomach, tip not included. 2. Progressed reticulonodular opacity in the right mid and lower lung compatible with multifocal pneumonia. Aspiration also a consideration. Underlying Emphysema. Electronically Signed   By: Genevie Ann M.D.   On: 07/10/2021 04:59   DG Chest Port 1 View  Result Date: 07/10/2021 CLINICAL DATA:  76 year old male with shortness of breath.  COPD. EXAM: PORTABLE CHEST 1 VIEW COMPARISON:  Portable chest 05/15/2021, chest CT 12/21/2020 and earlier. FINDINGS: Portable AP upright view at 0352 hours. Emphysema  demonstrated by CT last year. Stable lung volumes and mediastinal contours with borderline to mild cardiomegaly. Calcified aortic atherosclerosis. Visualized tracheal air column is within normal limits. No pneumothorax. No pleural effusion or consolidation. But diffuse increased coarse pulmonary interstitial opacity and patchy right mid lung opacity are increased from last month. No acute osseous abnormality identified. Paucity of bowel gas in the upper abdomen. IMPRESSION: Coarse pulmonary interstitial opacity in both lungs and patchy right mid lung opacity superimposed on chronic lung disease with emphysema. Favor acute viral/atypical infectious exacerbation. Electronically Signed   By: Genevie Ann M.D.   On: 07/10/2021 04:10    Procedures Procedure Name: Intubation Date/Time: 07/10/2021 4:50 AM Performed by: Orpah Greek, MD Pre-anesthesia Checklist: Patient identified, Patient being monitored, Emergency Drugs available, Timeout performed and Suction available Oxygen Delivery Method: Non-rebreather mask Preoxygenation: Pre-oxygenation with 100% oxygen Induction Type: Rapid sequence Ventilation: Mask ventilation without difficulty Laryngoscope Size: Glidescope and 3 Grade View: Grade I Tube size: 7.5 mm Number of attempts: 1 Placement Confirmation: ETT inserted through vocal cords under direct vision, CO2 detector and Breath sounds checked- equal and bilateral Secured at: 23 cm Tube secured with: ETT holder    .Central Line  Date/Time: 07/10/2021 6:35 AM Performed by: Orpah Greek, MD Authorized by: Orpah Greek, MD   Consent:    Consent obtained:  Emergent situation Universal protocol:    Site/side marked: yes     Immediately prior to procedure, a time out was called: yes     Patient identity confirmed:  Hospital-assigned identification number Pre-procedure details:    Indication(s): central venous access, hemodynamic monitoring and insufficient peripheral  access     Hand hygiene: Hand hygiene performed prior to insertion     Sterile barrier technique: All elements of maximal sterile technique followed     Skin preparation:  Chlorhexidine   Skin preparation agent: Skin preparation agent completely dried prior to procedure   Anesthesia:    Anesthesia method:  None Procedure details:    Location:  R femoral   Patient position:  Supine   Procedural supplies:  Triple lumen   Catheter size:  7 Fr   Landmarks identified: yes     Ultrasound guidance: yes     Ultrasound guidance timing: real time     Sterile ultrasound techniques: Sterile gel and sterile probe covers were used     Number of attempts:  1   Successful placement: yes   Post-procedure details:    Post-procedure:  Dressing applied and line sutured   Assessment:  Blood return through all ports and free fluid flow   Procedure completion:  Tolerated well, no immediate complications .Critical Care Performed by: Orpah Greek, MD Authorized by: Orpah Greek, MD   Critical care provider statement:    Critical care time (minutes):  40   Critical care was necessary to treat or prevent imminent or life-threatening deterioration of the following conditions:  Cardiac failure, respiratory failure and CNS failure or compromise   Critical care was time spent personally by me on the following activities:  Development of treatment plan with patient or surrogate, discussions with consultants, evaluation of patient's response to treatment, examination of patient, ordering and review of laboratory studies, ordering and review of radiographic studies, ordering and performing treatments and interventions, pulse oximetry, re-evaluation of patient's condition and review of old charts   I assumed direction of critical care for this patient from another provider in my specialty: no     Care discussed with: admitting provider      Medications Ordered in ED Medications  albuterol  (PROVENTIL,VENTOLIN) solution continuous neb (has no administration in time range)  succinylcholine (ANECTINE) syringe 100 mg (100 mg Intravenous Not Given 07/10/21 0439)  propofol (DIPRIVAN) 1000 MG/100ML infusion (0 mcg/kg/min  84 kg Intravenous Stopped 07/10/21 0630)  lactated ringers infusion (has no administration in time range)  vancomycin (VANCOCIN) IVPB 1000 mg/200 mL premix (has no administration in time range)  ceFEPIme (MAXIPIME) 2 g in sodium chloride 0.9 % 100 mL IVPB (has no administration in time range)  vancomycin (VANCOREADY) IVPB 750 mg/150 mL (has no administration in time range)  ceFEPIme (MAXIPIME) 2 g in sodium chloride 0.9 % 100 mL IVPB (has no administration in time range)  phenylephrine 0.4-0.9 MG/10ML-% injection (has no administration in time range)  fentaNYL 2548mg in NS 2573m(1026mml) infusion-PREMIX (0 mcg/hr Intravenous Stopped 07/10/21 0632)  LORazepam (ATIVAN) injection 1 mg (1 mg Intravenous Given 07/10/21 0408)  albuterol (PROVENTIL) (2.5 MG/3ML) 0.083% nebulizer solution 10 mg (10 mg Nebulization Given 07/10/21 0500)  etomidate (AMIDATE) injection 20 mg (20 mg Intravenous Given 07/10/21 0439)  rocuronium (ZEMURON) injection 100 mg (100 mg Intravenous Given 07/10/21 0439)  fentaNYL (SUBLIMAZE) injection 200 mcg (200 mcg Intravenous Given 07/10/21 0514)  lactated ringers bolus 1,000 mL (0 mLs Intravenous Stopped 07/10/21 0630)    And  lactated ringers bolus 1,000 mL (1,000 mLs Intravenous New Bag/Given 07/10/21 [id number]  And  lactated ringers bolus 1,000 mL (0 mLs Intravenous Stopped 07/10/21 0642)  fentaNYL (SUBLIMAZE) injection 100 mcg (100 mcg Intravenous Given 07/10/21 [id number]  ED Course/ Medical Decision Making/ A&P                           Medical Decision Making Amount and/or Complexity of Data Reviewed Labs: ordered. Radiology: ordered.  Risk Prescription drug management. Decision regarding hospitalization.   Patient presented to the emergency department for  evaluation of shortness of breath.  Patient became short of breath at  home prior to arrival.  Patient reports that he has a history of COPD.  He did use a DuoNeb and rescue inhaler with improvement prior to arrival.  Differential diagnosis would be COPD exacerbation, pneumonia, CHF  Patient was sinus tachycardia on the monitor at arrival.  Chest x-ray at arrival with bibasilar opacities, likely infiltrates.  Images independently reviewed by myself.  While being evaluated, patient began to complain of increased shortness of breath.  When he arrived he had mildly diminished breath sounds bilaterally but no wheezing.  Patient rapidly developed severely diminished breath sounds and wheezing.  Patient was placed on BiPAP and continuous nebulizer treatment was ordered.  He was extremely anxious, asked for something to help him relax.  He was given Ativan.  Patient became much less alert and it was a concern that he was not awake enough for BiPAP.  Arrangements were made for intubation which was done without difficulty.  After intubation, blood gas did return and he was severely hypercapnic.  Around this time his rhythm changed to a wide-complex tachycardia.  It was very irregular.  Patient has a history of atrial fibrillation and atrial flutter.  It was felt that this was A. fib with aberrant conduction, not V. tach.  Sedation was initiated with propofol.  Patient appears to be severely sensitive to the propofol.  Blood pressure dropped precipitously.  He was given a push dose of epi to stimulate cardiac output.  Blood pressure improved but he became severely tachycardic.  He did have a regular, wide-complex tachycardia at 220 bpm.  He therefore received 1 shock of 120 J for this which converted him to a sinus rhythm.  Patient maintained spontaneous circulation throughout all of the above events, never needed CPR.  Sedation to be switched to IV fentanyl due to his sensitivity to the propofol.  Repeat  x-ray after intubation and central line reveals significant worsening of the right lung.  This is likely multifocal pneumonia, although it is unclear why blossom so quickly.  Could be aspiration.  We will add Flagyl.  He did not receive any significant fluids to worsen the lung fields.  Asymmetric pulmonary edema is also a consideration.  ARDS less likely because of the asymmetry.  Discussed with Dr. Patsey Berthold, on-call for pulmonology/critical care at [facility].  We will arrange for daily time pulmonology/critical care at [facility] to evaluate the patient.  Patient to be admitted to [facility].  Patient's daughter is at the bedside.  Entire history of the encounter was discussed with her, she is a [employer] scientist.        Final Clinical Impression(s) / ED Diagnoses Final diagnoses:  Acute on chronic respiratory failure with hypoxia and hypercapnia [facility])    Rx / DC Orders ED Discharge Orders     None         Caleb Decock, Gwenyth Allegra, MD 07/10/21 1962    Orpah Greek, MD 07/10/21 [phone]

## 2021-07-10 NOTE — H&P (Signed)
NAME:  Ronald [name], MRN:  [id number], DOB:  December 01, 1945, LOS: 0 ADMISSION DATE:  07/10/2021, CONSULTATION DATE:  07/10/2021 REFERRING MD:  Dr. Roger Shelter, CHIEF COMPLAINT: Cardiac arrest  History of Present Illness:  [name] is a 76 year old male with a past medical history significant for COPD Gold stage III/IV, tracheal stenosistrach 2018, CAD status post DES x2 to LAD 2015, HTN, HLD, chronic hypoxic respiratory failure on 3 L nasal cannula at baseline, HFpef, and type 2 diabetes who presented to the ED early a.m. of 2/1 for complaints of shortness of breath.  Patient denies any chest pain on arrival.  Shortly after ED arrival patient began to complain of severe diffuse shortness of breath with no wheezing for which patient was placed on BiPAP, continuous nebulizer treatment and received as needed benzodiazepine for severe anxiety.    Shortly after benzodiazepine administration patient became less responsive and decision was made to empirically intubate.  Post intubation ABG revealed severe hypercapnia around this time a rhythm change to wide-complex tachycardia felt A. fib with aberrant conduction not V. Tach.  Post intubation patient received propofol which resulted in severe hypotension for which patient received epi push resulting in severe tachycardia and return of wide-complex rhythm prompting defibrillation.  Per ED report patient did not lose spontaneous circulation and never required CPR during event  PCCM consulted for further management, patient was accepted in transfer from [facility]  COPD Gold stage III/IV Tracheal stenosistrach 2018 CAD status post DES x2 to LAD 2015 HTN HLD Chronic hypoxic respiratory failure on 3 L nasal cannula at baseline HFpEF Type 2 diabetes   Significant Hospital Events: Including procedures, antibiotic start and stop dates in addition to other pertinent events   2/1 admitted for acute on chronic hypoxic and  hypercapnic respiratory failure  Interim History / Subjective:  As above  Objective   Blood pressure (!) 100/54, pulse 80, temperature 98.3 F (36.8 C), resp. rate 17, height 5' 9"  (1.753 m), weight 84 kg, SpO2 99 %.    Vent Mode: PRVC FiO2 (%):  [100 %] 100 % Set Rate:  [16 bmp-26 bmp] 16 bmp Vt Set:  [570 mL-600 mL] 600 mL PEEP:  [5 cmH20] 5 cmH20 Plateau Pressure:  [15 cmH20-27 cmH20] 15 cmH20   Intake/Output Summary (Last 24 hours) at 07/10/2021 1134 Last data filed at 07/10/2021 3112 Gross per 24 hour  Intake 2007.58 ml  Output --  Net 2007.58 ml   Filed Weights   07/10/21 0309  Weight: 84 kg    Examination: General: Acute on chronic ill-appearing elderly male lying in bed on mechanical ventilation in no acute distress HEENT: ETT, MM pink/moist, PERRL,  Neuro: Will arouse to verbal and painful stimuli CV: s1s2 regular rate and rhythm, no murmur, rubs, or gallops,  PULM: Very diminished bilaterally faint expiratory wheeze, tolerating mechanical ventilation, no increased work of breathing GI: soft, bowel sounds active in all 4 quadrants, non-tender, non-distended, tolerating TF Extremities: warm/dry, no edema  Skin: no rashes or lesions  Resolved Hospital Problem list     Assessment & Plan:  Acute on chronic hypoxic and hypercapnic respiratory failure -Initially managed with BiPAP therapy in ED but post benzodiazepine administration mentation significantly decreased and prompted intubation for airway protection History of severe COPD History of tracheal stenosis with prior trach at [facility] -Primary ENT Dr. Carol Ada, on chart review it appears patient has been dilated multiple times and has now been  decannulated (unknown date of decannulation) Concern for aspiration/community-acquired pneumonia -Repeat CXR post intubation and CVC access revealed extensive bilateral right worse than left infiltrates not seen on initial CXR History of sleep apnea P: Continue  ventilator support with lung protective strategies  Wean PEEP and FiO2 for sats greater than 90%. Head of bed elevated 30 degrees. Plateau pressures less than 30 cm H20.  Follow intermittent chest x-ray and ABG.   SAT/SBT as tolerated, mentation preclude extubation  Ensure adequate pulmonary hygiene  Follow cultures  VAP bundle in place  PAD protocol Continue empiric antibiotics Bronchodilators including Brovana Yupelri IV Solu-Medrol  History of CAD status post DES x2 to LAD History of atrial flutter with wide-complex tachycardia seen in ED -Shortly after intubation patient was seen with wide-complex tachycardia felt A. fib in nature and not V. Tach.  Received defibrillation x1 but never arrested and never received chest compressions Hx of HTN/HLD P: Start IV amiodarone drip Continuous telemetry Continue aspirin Strict intake and output Daily weight Vent support as above Obtain echocardiogram Check BNP As needed antihypertensives  Type 2 diabetes P: SSI  CBG goal 140-180 CBG checks q4  History of prior stroke P: Supportive care Secondary stroke prevention Frequent neurochecks Minimize sedation  Best Practice (right click and "Reselect all SmartList Selections" daily)   Diet/type: tubefeeds DVT prophylaxis: LMWH GI prophylaxis: PPI Lines: Central line Foley:  Yes, and it is still needed Code Status:  full code Last date of multidisciplinary goals of care discussion: Pending   Labs   CBC: Recent Labs  Lab 07/10/21 0324  WBC 12.3*  NEUTROABS 9.5*  HGB 12.8*  HCT 40.5  MCV 91.0  PLT 287    Basic Metabolic Panel: Recent Labs  Lab 07/10/21 0324 07/10/21 0908  NA 132* 132*  K 4.3 3.9  CL 98 98  CO2 31 26  GLUCOSE 177* 220*  BUN 13 14  CREATININE 1.07 1.07  CALCIUM 7.8* 7.4*   GFR: Estimated Creatinine Clearance: 61.5 mL/min (by C-G formula based on SCr of 1.07 mg/dL). Recent Labs  Lab 07/10/21 0324 07/10/21 0757 07/10/21 0908   PROCALCITON  --   --  <0.10  WBC 12.3*  --   --   LATICACIDVEN  --  3.9* 3.9*    Liver Function Tests: Recent Labs  Lab 07/10/21 0908  AST 17  ALT 12  ALKPHOS 82  BILITOT 0.7  PROT 5.2*  ALBUMIN 2.3*   No results for input(s): LIPASE, AMYLASE in the last 168 hours. No results for input(s): AMMONIA in the last 168 hours.  ABG    Component Value Date/Time   PHART 7.270 (L) 07/10/2021 0843   PCO2ART 62.5 (H) 07/10/2021 0843   PO2ART 109 (H) 07/10/2021 0843   HCO3 24.5 07/10/2021 0843   TCO2 37 (H) 05/16/2021 1024   ACIDBASEDEF 2.2 (H) 07/10/2021 0427   O2SAT 96.8 07/10/2021 0843     Coagulation Profile: Recent Labs  Lab 07/10/21 0739  INR 1.4*    Cardiac Enzymes: No results for input(s): CKTOTAL, CKMB, CKMBINDEX, TROPONINI in the last 168 hours.  HbA1C: HB A1C (BAYER DCA - WAIVED)  Date/Time Value Ref Range Status  09/12/2020 03:09 PM 6.8 <7.0 % Final    Comment:                                          Diabetic Adult            <  7.0                                       Healthy Adult        4.3 - 5.7                                                           (DCCT/NGSP) American Diabetes Association's Summary of Glycemic Recommendations for Adults with Diabetes: Hemoglobin A1c <7.0%. More stringent glycemic goals (A1c <6.0%) may further reduce complications at the cost of increased risk of hypoglycemia.   12/15/2019 12:52 PM 6.6 <7.0 % Final    Comment:                                          Diabetic Adult            <7.0                                       Healthy Adult        4.3 - 5.7                                                           (DCCT/NGSP) American Diabetes Association's Summary of Glycemic Recommendations for Adults with Diabetes: Hemoglobin A1c <7.0%. More stringent glycemic goals (A1c <6.0%) may further reduce complications at the cost of increased risk of hypoglycemia.    Hgb A1c MFr Bld  Date/Time Value Ref Range Status   05/02/2021 03:17 AM 7.5 (H) 4.8 - 5.6 % Final    Comment:    (NOTE) Pre diabetes:          5.7%-6.4%  Diabetes:              >6.4%  Glycemic control for   <7.0% adults with diabetes   12/22/2020 05:52 AM 7.9 (H) 4.8 - 5.6 % Final    Comment:    (NOTE) Pre diabetes:          5.7%-6.4%  Diabetes:              >6.4%  Glycemic control for   <7.0% adults with diabetes     CBG: No results for input(s): GLUCAP in the last 168 hours.  Review of Systems:   Unable to assess  Past Medical History:  He,  has a past medical history of Anxiety, Asthma, Chronic lower back pain, COPD (chronic obstructive pulmonary disease) ([place]), Coronary artery disease, Depression, Educated about COVID-19 virus infection (03/06/2020), GERD (gastroesophageal reflux disease), High cholesterol, Hypertension, NSTEMI (non-ST elevated myocardial infarction) ([place]) (05/2014), Sleep apnea, Stroke ([place]) (2017), TIA (transient ischemic attack), Type II diabetes mellitus ([place]), and Ulcerative colitis ([place]).   Surgical History:   Past Surgical History:  Procedure Laterality Date   APPENDECTOMY     BIOPSY  07/20/2020   Procedure: BIOPSY;  Surgeon:  Harvel Quale, MD;  [facility];  Service: Gastroenterology;;   CARDIAC CATHETERIZATION  [id number] X 3   CHOLECYSTECTOMY     COLONOSCOPY WITH PROPOFOL N/A 07/20/2020   Procedure: COLONOSCOPY WITH PROPOFOL;  Surgeon: Harvel Quale, MD;  [facility];  Service: Gastroenterology;  Laterality: N/A;  1:15   CORONARY ANGIOPLASTY WITH STENT PLACEMENT  05/2014   "2"   ESOPHAGEAL DILATION N/A 07/20/2020   Procedure: ESOPHAGEAL DILATION;  Surgeon: Harvel Quale, MD;  [facility];  Service: Gastroenterology;  Laterality: N/A;   ESOPHAGOGASTRODUODENOSCOPY (EGD) WITH PROPOFOL N/A 07/20/2020   Procedure: ESOPHAGOGASTRODUODENOSCOPY (EGD) WITH PROPOFOL;  Surgeon: Harvel Quale, MD;  [facility];   Service: Gastroenterology;  Laterality: N/A;   IR GASTROSTOMY TUBE MOD SED  06/04/2021   LEFT HEART CATH AND CORONARY ANGIOGRAPHY N/A 05/25/2020   Procedure: LEFT HEART CATH AND CORONARY ANGIOGRAPHY;  Surgeon: Martinique, Peter M, MD;  [facility];  Service: Cardiovascular;  Laterality: N/A;   POLYPECTOMY  07/20/2020   Procedure: POLYPECTOMY INTESTINAL;  Surgeon: Harvel Quale, MD;  [facility];  Service: Gastroenterology;;   TUMOR EXCISION Right ~ 1999   "side of my upper head"     Social History:   reports that he has been smoking cigarettes. He started smoking about 55 years ago. He has a 72.00 pack-year smoking history. He has never used smokeless tobacco. He reports that he does not currently use alcohol. He reports that he does not use drugs.   Family History:  His family history includes CAD in his father; Cancer in his brother and brother; Dementia in his sister; Emphysema in his sister; Leukemia in his sister; Lung cancer in his brother; Stroke in his mother.   Allergies Allergies  Allergen Reactions   Gabapentin Anxiety    Unknown reaction   Metformin And Related Rash     Home Medications  Prior to Admission medications   Medication Sig Start Date End Date Taking? Authorizing Provider  acetaminophen (TYLENOL) 325 MG tablet Place 2 tablets (650 mg total) into feeding tube every 4 (four) hours as needed for mild pain (temp > 101.5). 06/06/21  Yes Elgergawy, Silver Huguenin, MD  albuterol (VENTOLIN HFA) 108 (90 Base) MCG/ACT inhaler INHALE 2 PUFFS EVERY 6 HOURS AS NEEEDED Patient taking differently: Inhale 2 puffs into the lungs every 6 (six) hours as needed for wheezing or shortness of breath. 11/02/19  Yes Hawks, Christy A, FNP  ALPRAZolam (XANAX) 0.5 MG tablet Take 1 tablet (0.5 mg total) by mouth 2 (two) times daily as needed. for anxiety 07/05/21  Yes Hawks, Christy A, FNP  amiodarone (PACERONE) 200 MG tablet Take 200 mg by mouth daily. 07/03/21   Yes [provider]  Ascorbic Acid (VITAMIN C) 1000 MG tablet Take 1,000 mg by mouth daily.   Yes [provider]  atorvastatin (LIPITOR) 40 MG tablet TAKE 1 TABLET BY MOUTH ONCE DAILY IN THE EVENING Patient taking differently: Take 40 mg by mouth every evening. 12/05/20  Yes Hawks, Theador Hawthorne, FNP  blood glucose meter kit and supplies Dispense based on patient and insurance preference. Use up to four times daily as directed. (FOR ICD-10 E10.9, E11.9). 07/05/21  Yes Hawks, Alyse Low A, FNP  Blood Glucose Monitoring Suppl (ACCU-CHEK GUIDE ME) w/Device KIT Check BS daily Dx E11.9 05/16/20  Yes Hawks, Christy A, FNP  cholecalciferol (VITAMIN D3) 25 MCG (1000 UNIT) tablet Take 1,000 Units by mouth daily.  Yes [provider]  ELIQUIS 5 MG TABS tablet Take 5 mg by mouth 2 (two) times daily. 07/03/21  Yes [provider]  furosemide (LASIX) 40 MG tablet Take 40 mg by mouth daily. 07/03/21  Yes [provider]  glucose blood (ONE TOUCH ULTRA TEST) test strip USE TO CHECK GLUCOSE ONCE DAILY 02/11/18  Yes Terald Sleeper, PA-C  ipratropium-albuterol (DUONEB) 0.5-2.5 (3) MG/3ML SOLN Take 3 mLs by nebulization every 6 (six) hours as needed (asthma).   Yes [provider]  isosorbide mononitrate (ISMO) 10 MG tablet Take 10 mg by mouth 2 (two) times daily. 07/03/21  Yes [provider]  Metoprolol Tartrate 75 MG TABS Take 1 tablet by mouth 2 (two) times daily. 07/05/21  Yes [provider]  nicotine (NICODERM CQ - DOSED IN MG/24 HOURS) 14 mg/24hr patch Place 14 mg onto the skin daily.   Yes [provider]  nitroGLYCERIN (NITROSTAT) 0.4 MG SL tablet Place 0.4 mg under the tongue every 5 (five) minutes as needed for chest pain.   Yes [provider]  omeprazole (PRILOSEC) 20 MG capsule Take 20 mg by mouth daily. 07/05/21  Yes [provider]  OXYGEN Inhale 3 L into the lungs at bedtime.   Yes [provider]  budesonide  (PULMICORT) 0.5 MG/2ML nebulizer solution Take 2 mLs (0.5 mg total) by nebulization 2 (two) times daily. Patient not taking: Reported on 07/10/2021 07/05/21   [name] A, FNP  mirtazapine (REMERON) 15 MG tablet Take 15 mg by mouth at bedtime. Patient not taking: Reported on 07/10/2021 06/26/21   [provider]     Critical care time:    CRITICAL CARE Performed by: Jamin Humphries D. Harris  Total critical care time: 50 minutes  Critical care time was exclusive of separately billable procedures and treating other patients.  Critical care was necessary to treat or prevent imminent or life-threatening deterioration.  Critical care was time spent personally by me on the following activities: development of treatment plan with patient and/or surrogate as well as nursing, discussions with consultants, evaluation of patient's response to treatment, examination of patient, obtaining history from patient or surrogate, ordering and performing treatments and interventions, ordering and review of laboratory studies, ordering and review of radiographic studies, pulse oximetry and re-evaluation of patient's condition.  Kody Brandl D. Kenton Kingfisher, NP-C [facility] Personal contact information can be found on Amion  07/10/2021, 12:19 PM

## 2021-07-10 NOTE — Sepsis Progress Note (Signed)
Per secure chat with bedside RN, attempts were made to get the blood cultures, but were unsuccessful. Antibiotic was hung to prevent further delay in care.

## 2021-07-10 NOTE — ED Notes (Signed)
Pt back from CT

## 2021-07-10 NOTE — Consult Note (Signed)
NAME:  Ronald [name], MRN:  [id number], DOB:  September 20, 1945, LOS: 0 ADMISSION DATE:  07/10/2021, CONSULTATION DATE:  07/10/21 REFERRING MD:  EDP, [facility], CHIEF COMPLAINT:  acute resp distress/failed bipap   History of Present Illness:  52 yowm active smoker COPD, Gold stage III-IV, tracheal stenosis s/p trach in 2018, CAD s/p DES x2 to LAD in 2015, hypertension, hyperlipidemia, chronic respiratory failure on3L, HFpEF, diabetes mellitus type 2 last seen in pulmonary office 05/2020 presented to [facility] ER  3 am with sob x 24 h no resp to nebs / bipap and required ET with brief period of hypotension requiring epi and severe air trapping on vent rate of 26 at arrival of PCCM consult at 740 am with new extensive RLL AS dz on cxr p et c/w aspiration so referral to [facility] requested as no beds at [facility]  Pertinent  Medical History  COPD PFT's  04/04/20  FEV1 1.02 (33 % ) ratio 0.39  p 8 % improvement from saba p saba 6h prior to study with DLCO  9.74(39%) corrects to 2.37 (58%)  for alv volume and FV curve atypical features both on insp and exp but no plateau Tracheal stenosis with prolonged trach f/b [facility]  Chronic 02 dep resp faiulre with hypercarbic component  GERD CAD DM HBP   Significant Hospital Events: Including procedures, antibiotic start and stop dates in addition to other pertinent events   ET am 2/1 R Fem CVL  in ER  2/1  Interim History / Subjective:  Sedated on fent drip/ Bp improved p epi, air trapping resolved on lower back up rate  Objective   Blood pressure (!) 104/52, pulse 85, temperature 98.3 F (36.8 C), resp. rate 16, height 5' 9"  (1.753 m), weight 84 kg, SpO2 92 %.    Vent Mode: PRVC FiO2 (%):  [100 %] 100 % Set Rate:  [26 bmp] 26 bmp Vt Set:  [570 mL] 570 mL PEEP:  [5 cmH20] 5 cmH20 Plateau Pressure:  [15 [id number] cmH20] 15 cmH20   Intake/Output Summary (Last 24 hours) at 07/10/2021 0177 Last data filed at 07/10/2021 9390 Gross per 24 hour  Intake 1007.58 ml  Output --   Net 1007.58 ml   Filed Weights   07/10/21 0309  Weight: 84 kg    Examination: Tmax 98.3  General: chronic and acutely ill appearing  HENT: oral et  Lungs: pan exp wheeze Cardiovascular: distant S1S2 s aud m Abdomen: soft/ benign Extremities: swelling both UE's but not LE's/ no pitting in LE's Neuro: sedated     I personally reviewed images and agree with radiology impression as follows:  CXR:   portable 2/1 Extensive as dz RLL not present on admit/ et ok   Resolved Hospital Problem list     Assessment & Plan:  1) Acute on chronic hypoxemic and hypercarbic resp failure in pt with very severe copd s/p prior ET multiple assoc with tracheal stenosis  - clearly this now copd/not tracheal stenosis and wheeze continues p ET with marked air trapping on vent graphics so rx steroids/ bronchodilators  2) Transient hypotension p ET likely due to autopeep but can't rule out sepsis  >>> PCT protocol/ abx already started by ER  = max/ flagyl/vanc   3)  Marked asymetry to as dz rules against using much more peep but ok to titrate 02 down if tol for sats > 92%  4)  ? Aspiration at time of Et does not necessarily argue for abx  if PCT is normal   Best Practice (right click and "Reselect all SmartList Selections" daily)   Diet/type: NPO DVT prophylaxis: LMWH GI prophylaxis: PPI Lines: Central line Foley:  Yes, and it is still needed Code Status:  full code    Labs   CBC: Recent Labs  Lab 07/10/21 0324  WBC 12.3*  NEUTROABS 9.5*  HGB 12.8*  HCT 40.5  MCV 91.0  PLT 643    Basic Metabolic Panel: Recent Labs  Lab 07/10/21 0324  NA 132*  K 4.3  CL 98  CO2 31  GLUCOSE 177*  BUN 13  CREATININE 1.07  CALCIUM 7.8*   GFR: Estimated Creatinine Clearance: 61.5 mL/min (by C-G formula based on SCr of 1.07 mg/dL). Recent Labs  Lab 07/10/21 0324 07/10/21 0757  WBC 12.3*  --   LATICACIDVEN  --  3.9*    Liver Function Tests: No results for input(s): AST, ALT, ALKPHOS,  BILITOT, PROT, ALBUMIN in the last 168 hours. No results for input(s): LIPASE, AMYLASE in the last 168 hours. No results for input(s): AMMONIA in the last 168 hours.  ABG    Component Value Date/Time   PHART 7.042 (LL) 07/10/2021 0427   PCO2ART 107 (HH) 07/10/2021 0427   PO2ART 72.3 (L) 07/10/2021 0427   HCO3 18.2 (L) 07/10/2021 0427   TCO2 37 (H) 05/16/2021 1024   ACIDBASEDEF 2.2 (H) 07/10/2021 0427   O2SAT 84.1 07/10/2021 0427     Coagulation Profile: No results for input(s): INR, PROTIME in the last 168 hours.  Cardiac Enzymes: No results for input(s): CKTOTAL, CKMB, CKMBINDEX, TROPONINI in the last 168 hours.  HbA1C: HB A1C (BAYER DCA - WAIVED)  Date/Time Value Ref Range Status  09/12/2020 03:09 PM 6.8 <7.0 % Final    Comment:                                          Diabetic Adult            <7.0                                       Healthy Adult        4.3 - 5.7                                                           (DCCT/NGSP) American Diabetes Association's Summary of Glycemic Recommendations for Adults with Diabetes: Hemoglobin A1c <7.0%. More stringent glycemic goals (A1c <6.0%) may further reduce complications at the cost of increased risk of hypoglycemia.   12/15/2019 12:52 PM 6.6 <7.0 % Final    Comment:                                          Diabetic Adult            <7.0  Healthy Adult        4.3 - 5.7                                                           (DCCT/NGSP) American Diabetes Association's Summary of Glycemic Recommendations for Adults with Diabetes: Hemoglobin A1c <7.0%. More stringent glycemic goals (A1c <6.0%) may further reduce complications at the cost of increased risk of hypoglycemia.    Hgb A1c MFr Bld  Date/Time Value Ref Range Status  05/02/2021 03:17 AM 7.5 (H) 4.8 - 5.6 % Final    Comment:    (NOTE) Pre diabetes:          5.7%-6.4%  Diabetes:              >6.4%  Glycemic control  for   <7.0% adults with diabetes   12/22/2020 05:52 AM 7.9 (H) 4.8 - 5.6 % Final    Comment:    (NOTE) Pre diabetes:          5.7%-6.4%  Diabetes:              >6.4%  Glycemic control for   <7.0% adults with diabetes     CBG: No results for input(s): GLUCAP in the last 168 hours.     Past Medical History:  He,  has a past medical history of Anxiety, Asthma, Chronic lower back pain, COPD (chronic obstructive pulmonary disease) ([place]), Coronary artery disease, Depression, Educated about COVID-19 virus infection (03/06/2020), GERD (gastroesophageal reflux disease), High cholesterol, Hypertension, NSTEMI (non-ST elevated myocardial infarction) ([place]) (05/2014), Sleep apnea, Stroke ([place]) (2017), TIA (transient ischemic attack), Type II diabetes mellitus ([place]), and Ulcerative colitis ([place]).   Surgical History:   Past Surgical History:  Procedure Laterality Date   APPENDECTOMY     BIOPSY  07/20/2020   Procedure: BIOPSY;  Surgeon: Harvel Quale, MD;  [facility];  Service: Gastroenterology;;   CARDIAC CATHETERIZATION  [phone]   CHOLECYSTECTOMY     COLONOSCOPY WITH PROPOFOL N/A 07/20/2020   Procedure: COLONOSCOPY WITH PROPOFOL;  Surgeon: Harvel Quale, MD;  [facility];  Service: Gastroenterology;  Laterality: N/A;  1:15   CORONARY ANGIOPLASTY WITH STENT PLACEMENT  05/2014   "2"   ESOPHAGEAL DILATION N/A 07/20/2020   Procedure: ESOPHAGEAL DILATION;  Surgeon: Harvel Quale, MD;  [facility];  Service: Gastroenterology;  Laterality: N/A;   ESOPHAGOGASTRODUODENOSCOPY (EGD) WITH PROPOFOL N/A 07/20/2020   Procedure: ESOPHAGOGASTRODUODENOSCOPY (EGD) WITH PROPOFOL;  Surgeon: Harvel Quale, MD;  [facility];  Service: Gastroenterology;  Laterality: N/A;   IR GASTROSTOMY TUBE MOD SED  06/04/2021   LEFT HEART CATH AND CORONARY ANGIOGRAPHY N/A 05/25/2020   Procedure: LEFT HEART CATH AND CORONARY  ANGIOGRAPHY;  Surgeon: Martinique, Peter M, MD;  Location: [facility];  Service: Cardiovascular;  Laterality: N/A;   POLYPECTOMY  07/20/2020   Procedure: POLYPECTOMY INTESTINAL;  Surgeon: Harvel Quale, MD;  [facility]: [facility];  Service: Gastroenterology;;   TUMOR EXCISION Right ~ 1999   "side of my upper head"     Social History:   reports that he has been smoking cigarettes. He started smoking about 55 years ago. He has a 72.00 pack-year smoking history. He has never used smokeless tobacco. He reports that he does  not currently use alcohol. He reports that he does not use drugs.   Family History:  His family history includes CAD in his father; Cancer in his brother and brother; Dementia in his sister; Emphysema in his sister; Leukemia in his sister; Lung cancer in his brother; Stroke in his mother.   Allergies Allergies  Allergen Reactions   Gabapentin Anxiety    Unknown reaction   Metformin And Related Rash     Home Medications  Prior to Admission medications   Medication Sig Start Date End Date Taking? Authorizing Provider  acetaminophen (TYLENOL) 325 MG tablet Place 2 tablets (650 mg total) into feeding tube every 4 (four) hours as needed for mild pain (temp > 101.5). 06/06/21  Yes Elgergawy, Silver Huguenin, MD  albuterol (VENTOLIN HFA) 108 (90 Base) MCG/ACT inhaler INHALE 2 PUFFS EVERY 6 HOURS AS NEEEDED Patient taking differently: Inhale 2 puffs into the lungs every 6 (six) hours as needed for wheezing or shortness of breath. 11/02/19  Yes Hawks, Christy A, FNP  ALPRAZolam (XANAX) 0.5 MG tablet Take 1 tablet (0.5 mg total) by mouth 2 (two) times daily as needed. for anxiety 07/05/21  Yes Hawks, Christy A, FNP  amiodarone (PACERONE) 200 MG tablet Take 200 mg by mouth daily. 07/03/21  Yes [provider]  Ascorbic Acid (VITAMIN C) 1000 MG tablet Take 1,000 mg by mouth daily.   Yes [provider]  atorvastatin (LIPITOR) 40 MG tablet TAKE 1 TABLET BY  MOUTH ONCE DAILY IN THE EVENING Patient taking differently: Take 40 mg by mouth every evening. 12/05/20  Yes Hawks, Theador Hawthorne, FNP  blood glucose meter kit and supplies Dispense based on patient and insurance preference. Use up to four times daily as directed. (FOR ICD-10 E10.9, E11.9). 07/05/21  Yes Hawks, Alyse Low A, FNP  Blood Glucose Monitoring Suppl (ACCU-CHEK GUIDE ME) w/Device KIT Check BS daily Dx E11.9 05/16/20  Yes Hawks, Christy A, FNP  cholecalciferol (VITAMIN D3) 25 MCG (1000 UNIT) tablet Take 1,000 Units by mouth daily.   Yes [provider]  ELIQUIS 5 MG TABS tablet Take 5 mg by mouth 2 (two) times daily. 07/03/21  Yes [provider]  furosemide (LASIX) 40 MG tablet Take 40 mg by mouth daily. 07/03/21  Yes [provider]  glucose blood (ONE TOUCH ULTRA TEST) test strip USE TO CHECK GLUCOSE ONCE DAILY 02/11/18  Yes Terald Sleeper, PA-C  ipratropium-albuterol (DUONEB) 0.5-2.5 (3) MG/3ML SOLN Take 3 mLs by nebulization every 6 (six) hours as needed (asthma).   Yes [provider]  isosorbide mononitrate (ISMO) 10 MG tablet Take 10 mg by mouth 2 (two) times daily. 07/03/21  Yes [provider]  Metoprolol Tartrate 75 MG TABS Take 1 tablet by mouth 2 (two) times daily. 07/05/21  Yes [provider]  nicotine (NICODERM CQ - DOSED IN MG/24 HOURS) 14 mg/24hr patch Place 14 mg onto the skin daily.   Yes [provider]  nitroGLYCERIN (NITROSTAT) 0.4 MG SL tablet Place 0.4 mg under the tongue every 5 (five) minutes as needed for chest pain.   Yes [provider]  omeprazole (PRILOSEC) 20 MG capsule Take 20 mg by mouth daily. 07/05/21  Yes [provider]  OXYGEN Inhale 3 L into the lungs at bedtime.   Yes [provider]  budesonide (PULMICORT) 0.5 MG/2ML nebulizer solution Take 2 mLs (0.5 mg total) by nebulization 2 (two) times daily. Patient not taking: Reported on 07/10/2021 07/05/21   [name], FNP  mirtazapine (REMERON) 15 MG tablet Take 15 mg by mouth at bedtime. Patient not taking: Reported on 07/10/2021 06/26/21   [provider]      No beds available at [facility]/ transfer to [facility] in progress   The patient is critically ill with multiple organ systems failure and requires high complexity decision making for assessment and support, frequent evaluation and titration of therapies, application of advanced monitoring technologies and extensive interpretation of multiple databases. Critical Care Time devoted to patient care services described in this note is 60 minutes.   Christinia Gully, MD Pulmonary and [place] Cell [phone]   After 7:00 pm call Elink  [phone]     Christinia Gully, MD Pulmonary and [place] [phone]   After 7:00 pm call Elink  [phone]

## 2021-07-10 NOTE — ED Notes (Signed)
BP has been low the last three cycles. Last BP reading was 91/49. MD made aware. Order to start another 1000 cc LR bolus

## 2021-07-10 NOTE — ED Triage Notes (Signed)
[facility] - pt with COPD and called out for SOB that started today. No chest pain. 3L chronically sating at 96% and fast breathing. Pt tried neb and rescue inhaler.

## 2021-07-10 NOTE — Progress Notes (Signed)
Pharmacy Antibiotic Note  Ronald [name] is a 76 y.o. male admitted on 07/10/2021 with pneumonia.  Pharmacy has been consulted for Vancomycin/Cefepime dosing. Pt with respiratory failure requiring intubation. WBC mildly elevated. Renal function ok. CXR with possible PNA.   Plan: Vancomycin 750 mg IV q12h >>>Estimated AUC: 447 Cefepime 2g IV q8h Trend WBC, temp, renal function  F/U infectious work-up Drug levels as indicated   Height: 5\' 9"  (175.3 cm) Weight: 84 kg (185 lb 3 oz) IBW/kg (Calculated) : 70.7  Temp (24hrs), Avg:98.3 F (36.8 C), Min:98.3 F (36.8 C), Max:98.3 F (36.8 C)  Recent Labs  Lab 07/10/21 0324  WBC 12.3*  CREATININE 1.07    Estimated Creatinine Clearance: 61.5 mL/min (by C-G formula based on SCr of 1.07 mg/dL).    Allergies  Allergen Reactions   Gabapentin Anxiety    Unknown reaction   Metformin And Related Rash    Narda Bonds, PharmD, BCPS Clinical Pharmacist Phone: [phone]

## 2021-07-10 NOTE — Sepsis Progress Note (Addendum)
Notified provider of need to order lactic acid. Notified bedside nurse of need to draw blood cultures and lactic acid when ordered.

## 2021-07-10 NOTE — Sepsis Progress Note (Addendum)
Secure chat with provider and bedside nurse regarding need for lactic acid order for the sepsis protocol.

## 2021-07-10 NOTE — ED Notes (Signed)
Attempted to obtain blood cultures before antibiotic administration; However, unable to obtain blood cultures due to difficulty accessing a vein. MD made aware. Per order, antibiotics were not delayed and blood cultures were drawn after antibiotic administration.

## 2021-07-10 NOTE — Sepsis Progress Note (Signed)
Sepsis protocol is being followed by [facility]. 

## 2021-07-11 DIAGNOSIS — J441 Chronic obstructive pulmonary disease with (acute) exacerbation: Secondary | ICD-10-CM | POA: Diagnosis not present

## 2021-07-11 DIAGNOSIS — J9601 Acute respiratory failure with hypoxia: Secondary | ICD-10-CM | POA: Diagnosis not present

## 2021-07-11 DIAGNOSIS — Z9911 Dependence on respirator [ventilator] status: Secondary | ICD-10-CM | POA: Diagnosis not present

## 2021-07-11 DIAGNOSIS — J9602 Acute respiratory failure with hypercapnia: Secondary | ICD-10-CM | POA: Diagnosis not present

## 2021-07-11 LAB — HEMOGLOBIN A1C
Hgb A1c MFr Bld: 6 % — ABNORMAL HIGH (ref 4.8–5.6)
Mean Plasma Glucose: 126 mg/dL

## 2021-07-11 LAB — URINE CULTURE: Culture: NO GROWTH

## 2021-07-11 LAB — APTT
aPTT: 144 seconds — ABNORMAL HIGH (ref 24–36)
aPTT: 53 seconds — ABNORMAL HIGH (ref 24–36)
aPTT: 96 seconds — ABNORMAL HIGH (ref 24–36)

## 2021-07-11 LAB — MAGNESIUM
Magnesium: 1.2 mg/dL — ABNORMAL LOW (ref 1.7–2.4)
Magnesium: 2.7 mg/dL — ABNORMAL HIGH (ref 1.7–2.4)

## 2021-07-11 LAB — PROTIME-INR
INR: 1.3 — ABNORMAL HIGH (ref 0.8–1.2)
Prothrombin Time: 16.4 seconds — ABNORMAL HIGH (ref 11.4–15.2)

## 2021-07-11 LAB — BASIC METABOLIC PANEL
Anion gap: 10 (ref 5–15)
BUN: 19 mg/dL (ref 8–23)
CO2: 26 mmol/L (ref 22–32)
Calcium: 8 mg/dL — ABNORMAL LOW (ref 8.9–10.3)
Chloride: 100 mmol/L (ref 98–111)
Creatinine, Ser: 1.57 mg/dL — ABNORMAL HIGH (ref 0.61–1.24)
GFR, Estimated: 46 mL/min — ABNORMAL LOW (ref >60)
Glucose, Bld: 172 mg/dL — ABNORMAL HIGH (ref 70–99)
Potassium: 4.5 mmol/L (ref 3.5–5.1)
Sodium: 136 mmol/L (ref 135–145)

## 2021-07-11 LAB — POCT I-STAT 7, (LYTES, BLD GAS, ICA,H+H)
Acid-Base Excess: 0 mmol/L (ref 0.0–2.0)
Bicarbonate: 28.3 mmol/L — ABNORMAL HIGH (ref 20.0–28.0)
Calcium, Ion: 1.2 mmol/L (ref 1.15–1.40)
HCT: 34 % — ABNORMAL LOW (ref 39.0–52.0)
Hemoglobin: 11.6 g/dL — ABNORMAL LOW (ref 13.0–17.0)
O2 Saturation: 91 %
Patient temperature: 98
Potassium: 4.8 mmol/L (ref 3.5–5.1)
Sodium: 134 mmol/L — ABNORMAL LOW (ref 135–145)
TCO2: 30 mmol/L (ref 22–32)
pCO2 arterial: 62 mmHg — ABNORMAL HIGH (ref 32.0–48.0)
pH, Arterial: 7.266 — ABNORMAL LOW (ref 7.350–7.450)
pO2, Arterial: 70 mmHg — ABNORMAL LOW (ref 83.0–108.0)

## 2021-07-11 LAB — GLUCOSE, CAPILLARY
Glucose-Capillary: 130 mg/dL — ABNORMAL HIGH (ref 70–99)
Glucose-Capillary: 141 mg/dL — ABNORMAL HIGH (ref 70–99)
Glucose-Capillary: 158 mg/dL — ABNORMAL HIGH (ref 70–99)
Glucose-Capillary: 173 mg/dL — ABNORMAL HIGH (ref 70–99)
Glucose-Capillary: 176 mg/dL — ABNORMAL HIGH (ref 70–99)
Glucose-Capillary: 191 mg/dL — ABNORMAL HIGH (ref 70–99)
Glucose-Capillary: 206 mg/dL — ABNORMAL HIGH (ref 70–99)

## 2021-07-11 LAB — HEPARIN LEVEL (UNFRACTIONATED)
Heparin Unfractionated: >1.1 IU/mL — ABNORMAL HIGH (ref 0.30–0.70)
Heparin Unfractionated: >1.1 IU/mL — ABNORMAL HIGH (ref 0.30–0.70)

## 2021-07-11 LAB — BRAIN NATRIURETIC PEPTIDE: B Natriuretic Peptide: 293 pg/mL — ABNORMAL HIGH (ref 0.0–100.0)

## 2021-07-11 LAB — PROCALCITONIN: Procalcitonin: 1.15 ng/mL

## 2021-07-11 LAB — PHOSPHORUS: Phosphorus: 5.5 mg/dL — ABNORMAL HIGH (ref 2.5–4.6)

## 2021-07-11 MED ORDER — MAGNESIUM SULFATE 4 GM/100ML IV SOLN
4.0000 g | Freq: Once | INTRAVENOUS | Status: AC
  Administered 2021-07-11: 4 g via INTRAVENOUS
  Filled 2021-07-11: qty 100

## 2021-07-11 MED ORDER — MIDAZOLAM-SODIUM CHLORIDE 100-0.9 MG/100ML-% IV SOLN
0.5000 mg/h | INTRAVENOUS | Status: DC
  Administered 2021-07-11: 0.5 mg/h via INTRAVENOUS
  Filled 2021-07-11: qty 100

## 2021-07-11 MED ORDER — AMIODARONE HCL IN DEXTROSE 360-4.14 MG/200ML-% IV SOLN
INTRAVENOUS | Status: AC
  Administered 2021-07-11: 30 mg/h via INTRAVENOUS
  Filled 2021-07-11: qty 200

## 2021-07-11 MED ORDER — AMIODARONE HCL IN DEXTROSE 360-4.14 MG/200ML-% IV SOLN
30.0000 mg/h | INTRAVENOUS | Status: DC
  Administered 2021-07-11 – 2021-07-13 (×4): 30 mg/h via INTRAVENOUS
  Filled 2021-07-11 (×4): qty 200

## 2021-07-11 MED ORDER — MAGNESIUM SULFATE 2 GM/50ML IV SOLN
2.0000 g | Freq: Once | INTRAVENOUS | Status: AC
  Administered 2021-07-11: 2 g via INTRAVENOUS
  Filled 2021-07-11: qty 50

## 2021-07-11 MED ORDER — HEPARIN BOLUS VIA INFUSION
2000.0000 [IU] | Freq: Once | INTRAVENOUS | Status: AC
  Administered 2021-07-11: 2000 [IU] via INTRAVENOUS
  Filled 2021-07-11: qty 2000

## 2021-07-11 MED ORDER — VITAL AF 1.2 CAL PO LIQD
1000.0000 mL | ORAL | Status: DC
  Administered 2021-07-11 – 2021-07-16 (×7): 1000 mL
  Filled 2021-07-11 (×6): qty 1000

## 2021-07-11 MED ORDER — HEPARIN (PORCINE) 25000 UT/250ML-% IV SOLN
1600.0000 [IU]/h | INTRAVENOUS | Status: DC
  Administered 2021-07-11 – 2021-07-12 (×2): 1300 [IU]/h via INTRAVENOUS
  Administered 2021-07-13: 1350 [IU]/h via INTRAVENOUS
  Filled 2021-07-11 (×2): qty 250

## 2021-07-11 MED ORDER — SODIUM CHLORIDE 0.9 % IV SOLN
100.0000 mg | Freq: Two times a day (BID) | INTRAVENOUS | Status: DC
  Administered 2021-07-11 (×2): 100 mg via INTRAVENOUS
  Filled 2021-07-11 (×3): qty 100

## 2021-07-11 MED ORDER — SODIUM CHLORIDE 0.9 % IV SOLN
3.0000 g | Freq: Four times a day (QID) | INTRAVENOUS | Status: DC
  Administered 2021-07-11 – 2021-07-14 (×12): 3 g via INTRAVENOUS
  Filled 2021-07-11 (×13): qty 8

## 2021-07-11 MED ORDER — METHYLPREDNISOLONE SODIUM SUCC 125 MG IJ SOLR
80.0000 mg | Freq: Three times a day (TID) | INTRAMUSCULAR | Status: DC
  Administered 2021-07-11 – 2021-07-12 (×3): 80 mg via INTRAVENOUS
  Filled 2021-07-11 (×3): qty 2

## 2021-07-11 MED ORDER — INSULIN ASPART 100 UNIT/ML IJ SOLN
4.0000 [IU] | INTRAMUSCULAR | Status: DC
  Administered 2021-07-11 – 2021-07-14 (×18): 4 [IU] via SUBCUTANEOUS

## 2021-07-11 NOTE — Progress Notes (Signed)
Initial Nutrition Assessment  DOCUMENTATION CODES:   Not applicable  INTERVENTION:   Change tube feeding via PEG: Vital AF 1.2 at 75 ml/h (1800 ml per day)  Provides 2160 kcal, 135 gm protein, 1460 ml free water daily.  NUTRITION DIAGNOSIS:   Inadequate oral intake related to inability to eat as evidenced by NPO status.  GOAL:   Patient will meet greater than or equal to 90% of their needs  MONITOR:   Vent status, Labs, TF tolerance  REASON FOR ASSESSMENT:   Ventilator, Consult Enteral/tube feeding initiation and management  ASSESSMENT:   76 yo male admitted with SOB, COPD exacerbation, tachycardia requiring cardioversion. PMH includes COPD Gold stage III/IV, tracheal stenosis, tracheostomy, PEG, CAD, HTN, HLD, HF, DM-2, chronic respiratory failure.  Discussed patient in ICU rounds and with RN today. Starting steroids today.  Will likely need to begin Levophed soon. Received MD Consult for TF initiation and management. Patient has a PEG and is currently receiving Vital High Protein at 40 ml/h with Prosource TF 45 ml BID to provide 1040 kcal, 106 gm protein, 803 ml free water daily. Tolerating well.   Patient is currently intubated on ventilator support MV: 12.7 L/min Temp (24hrs), Avg:98 F (36.7 C), Min:96.8 F (36 C), Max:98.7 F (37.1 C)   Labs reviewed. Na 134, phos 5.5, mag 1.2 CBG: 173-206  Medications reviewed and include Novolog, Solumedrol, Protonix, Flagyl, Levophed.   NUTRITION - FOCUSED PHYSICAL EXAM:  Flowsheet Row Most Recent Value  Orbital Region Mild depletion  Upper Arm Region No depletion  Thoracic and Lumbar Region No depletion  Buccal Region Mild depletion  Temple Region Mild depletion  Clavicle Bone Region No depletion  Clavicle and Acromion Bone Region No depletion  Scapular Bone Region Unable to assess  Dorsal Hand No depletion  Patellar Region No depletion  Anterior Thigh Region No depletion  Posterior Calf Region No  depletion  Edema (RD Assessment) Mild  Hair Reviewed  Eyes Unable to assess  Mouth Reviewed  Skin Reviewed  Nails Reviewed       Diet Order:   Diet Order             Diet NPO time specified  Diet effective now                   EDUCATION NEEDS:   No education needs have been identified at this time  Skin:  Skin Assessment: Reviewed RN Assessment  Last BM:  no BM documented  Height:   Ht Readings from Last 1 Encounters:  07/10/21 5\' 9"  (1.753 m)    Weight:   Wt Readings from Last 1 Encounters:  07/11/21 87.2 kg    BMI:  Body mass index is 28.39 kg/m.  Estimated Nutritional Needs:   Kcal:  2050-2250  Protein:  130-140 gm  Fluid:  >/= 2.1 L   Lucas Mallow RD, LDN, CNSC Please refer to Amion for contact information.
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
## 2021-07-11 NOTE — Progress Notes (Signed)
ANTICOAGULATION CONSULT NOTE - Initial Consult  Pharmacy Consult for IV Heparin Indication: atrial fibrillation  Allergies  Allergen Reactions   Gabapentin Anxiety    Unknown reaction   Metformin And Related Rash    Patient Measurements: Height: 5\' 9"  (175.3 cm) Weight: 87.2 kg (192 lb 3.9 oz) IBW/kg (Calculated) : 70.7 Heparin Dosing Weight: 88 kg  Vital Signs: Temp: 98.1 F (36.7 C) (02/02 0800) Temp Source: Axillary (02/02 0800) BP: 93/55 (02/02 0900) Pulse Rate: 76 (02/02 0900)  Labs: Recent Labs    07/10/21 0324 07/10/21 0739 07/10/21 0908 07/10/21 1321 07/10/21 2013 07/10/21 2334 07/11/21 0310 07/11/21 0433 07/11/21 0649  HGB 12.8*  --   --   --  13.9  --   --  11.6*  --   HCT 40.5  --   --   --  41.0  --   --  34.0*  --   PLT 391  --   --   --   --   --   --   --   --   APTT  --  34  --   --   --  96* 53*  --   --   LABPROT  --  16.7*  --   --   --   --  16.4*  --   --   INR  --  1.4*  --   --   --   --  1.3*  --   --   HEPARINUNFRC  --   --   --   --   --  >1.10*  --   --  >1.10*  CREATININE 1.07  --  1.07  --   --   --  1.57*  --   --   TROPONINIHS 37*  --  49* 74*  --   --   --   --   --      Estimated Creatinine Clearance: 45.8 mL/min (A) (by C-G formula based on SCr of 1.57 mg/dL (H)).   Medical History: Past Medical History:  Diagnosis Date   Anxiety    Asthma    Atrial flutter ([place]) 04/2021   Chronic lower back pain    COPD (chronic obstructive pulmonary disease) (HCC)    Coronary artery disease    a. NSTEMI 05/2014 s/p DESx2 to LAD at [facility].   Depression    Educated about COVID-19 virus infection 03/06/2020   GERD (gastroesophageal reflux disease)    High cholesterol    Hypertension    NSTEMI (non-ST elevated myocardial infarction) ([place]) 05/2014   with stent placement   Sleep apnea    Stroke ([place]) 2017   anyeusym    TIA (transient ischemic attack)    "they say I've had some mini strokes; don't know when"; denies residual on  06/22/2014)   Type II diabetes mellitus ([place])    Ulcerative colitis [facility])     Assessment: 76 yr old man admitted as transfer from [facility] with acute hypoxic and  hypercapnic vent-dependent respiratory failure and atrial tachycardia requiring cardioversion (wide-complex irregular tachy, possibly a fib with aberrancy). Phamacy is consulted to dose IV heparin (per med rec, pt was taking apixaban PTA, with last dose at 2100 on 07/09/21). Given recent apixaban exposure, will monitor anticoagulation using aPTT until aPTT and heparin levels correlate.  Pt rec'd dose of enoxaparin 40 mg SQ at 0940 AM on 2/1  HL >1.10, aPTT 53, CBC stable. No signs or symptoms of bleeding and  issues with infusion per RN.   Goal of Therapy:  Heparin level 0.3-0.7 units/ml aPTT: 66-102 sec Monitor platelets by anticoagulation protocol: Yes   Plan:  Heparin Bolus 2000 units x1 dose Increase heparin infusion to 1450 units/h Check aPTT/heparin level in ~7-8 hrs Monitor daily aPTT, heparin level, CBC Monitor for bleeding  Lestine Box, PharmD PGY2 Infectious Diseases Pharmacy Resident   Please check [email or web address] for unit-specific pharmacy phone numbers

## 2021-07-11 NOTE — Progress Notes (Signed)
ANTICOAGULATION CONSULT NOTE  Pharmacy Consult for IV Heparin Indication: atrial fibrillation  Allergies  Allergen Reactions   Gabapentin Anxiety    Unknown reaction   Metformin And Related Rash    Patient Measurements: Height: 5\' 9"  (175.3 cm) Weight: 88 kg (194 lb 0.1 oz) IBW/kg (Calculated) : 70.7 Heparin Dosing Weight: 88 kg  Vital Signs: Temp: 98.3 F (36.8 C) (02/01 2330) Temp Source: Oral (02/01 2330) BP: 95/56 (02/01 2340) Pulse Rate: 70 (02/01 2340)  Labs: Recent Labs    07/10/21 0324 07/10/21 0739 07/10/21 0908 07/10/21 1321 07/10/21 2013 07/10/21 2334  HGB 12.8*  --   --   --  13.9  --   HCT 40.5  --   --   --  41.0  --   PLT 391  --   --   --   --   --   APTT  --  34  --   --   --  96*  LABPROT  --  16.7*  --   --   --   --   INR  --  1.4*  --   --   --   --   HEPARINUNFRC  --   --   --   --   --  >1.10*  CREATININE 1.07  --  1.07  --   --   --   TROPONINIHS 37*  --  49* 74*  --   --      Estimated Creatinine Clearance: 67.5 mL/min (by C-G formula based on SCr of 1.07 mg/dL).   Assessment: 76 yr old man admitted as transfer from [facility] with acute hypoxic and  hypercapnic vent-dependent respiratory failure and atrial tachycardia requiring cardioversion (wide-complex irregular tachy, possibly a fib with aberrancy). Phamacy is consulted to dose IV heparin (per med rec, pt was taking apixaban PTA, with last dose at 2100 on 07/09/21). Given recent apixaban exposure, will monitor anticoagulation using aPTT until aPTT and heparin levels correlate. H/H 12.8/40.5, plt 391, CrCl 67.5 ml/min; INR 1.4, aPTT 34 sec.  Pt rec'd dose of enoxaparin 40 mg SQ at 0940 AM today.  Initial PTT therapeutic   Goal of Therapy:  Heparin level 0.3-0.7 units/ml aPTT: 66-102 sec Monitor platelets by anticoagulation protocol: Yes   Plan:  Continue heparin at 1250 units / hr Monitor daily aPTT, heparin level, CBC Monitor for bleeding  Thank you Anette Guarneri,  PharmD 07/11/2021,12:08 AM

## 2021-07-11 NOTE — Progress Notes (Signed)
[facility] Progress Note Patient Name: Ronald [name] DOB: 1946-05-02 MRN: [id number]   Date of Service  07/11/2021  HPI/Events of Note  ABG on 80%/PRVC 28/TV 600/P 5 = 7.266/62.0/70/ 28.3. Sedated with a Fentanyl IV infusion. Will add low dose Versed IV infusion in the event he may require NMB. He was on Propofol yesterday which was D/Ced for an unclear reason. Ppeak = 34.  eICU Interventions  Plan: Versed IV infusion. Titrate to RASS = 0 to -1.     Intervention Category Major Interventions: Respiratory failure - evaluation and management  Xia Stohr [place] 07/11/2021, 5:20 AM

## 2021-07-11 NOTE — Progress Notes (Signed)
ANTICOAGULATION CONSULT NOTE Pharmacy Consult for IV Heparin Indication: atrial fibrillation  Allergies  Allergen Reactions   Gabapentin Anxiety    Unknown reaction   Metformin And Related Rash    Patient Measurements: Height: 5\' 9"  (175.3 cm) Weight: 87.2 kg (192 lb 3.9 oz) IBW/kg (Calculated) : 70.7 Heparin Dosing Weight: 88 kg  Vital Signs: Temp: 98.2 F (36.8 C) (02/02 1949) Temp Source: Axillary (02/02 1949) BP: 87/49 (02/02 2002) Pulse Rate: 61 (02/02 2002)  Labs: Recent Labs    07/10/21 0324 07/10/21 0324 07/10/21 0739 07/10/21 0908 07/10/21 1321 07/10/21 2013 07/10/21 2334 07/11/21 0310 07/11/21 0433 07/11/21 0649 07/11/21 1839  HGB 12.8*  --   --   --   --  13.9  --   --  11.6*  --   --   HCT 40.5  --   --   --   --  41.0  --   --  34.0*  --   --   PLT 391  --   --   --   --   --   --   --   --   --   --   APTT  --    < > 34  --   --   --  96* 53*  --   --  144*  LABPROT  --   --  16.7*  --   --   --   --  16.4*  --   --   --   INR  --   --  1.4*  --   --   --   --  1.3*  --   --   --   HEPARINUNFRC  --   --   --   --   --   --  >1.10*  --   --  >1.10*  --   CREATININE 1.07  --   --  1.07  --   --   --  1.57*  --   --   --   TROPONINIHS 37*  --   --  49* 74*  --   --   --   --   --   --    < > = values in this interval not displayed.     Estimated Creatinine Clearance: 45.8 mL/min (A) (by C-G formula based on SCr of 1.57 mg/dL (H)).   Medical History: Past Medical History:  Diagnosis Date   Anxiety    Asthma    Atrial flutter ([place]) 04/2021   Chronic lower back pain    COPD (chronic obstructive pulmonary disease) (HCC)    Coronary artery disease    a. NSTEMI 05/2014 s/p DESx2 to LAD at [facility].   Depression    Educated about COVID-19 virus infection 03/06/2020   GERD (gastroesophageal reflux disease)    High cholesterol    Hypertension    NSTEMI (non-ST elevated myocardial infarction) ([place]) 05/2014   with stent placement   Sleep apnea     Stroke ([place]) 2017   anyeusym    TIA (transient ischemic attack)    "they say I've had some mini strokes; don't know when"; denies residual on 06/22/2014)   Type II diabetes mellitus ([place])    Ulcerative colitis [facility])     Assessment: 76 yr old man admitted as transfer from [facility] with acute hypoxic and  hypercapnic vent-dependent respiratory failure and atrial tachycardia requiring cardioversion (wide-complex irregular tachy, possibly a fib with aberrancy). Phamacy  is consulted to dose IV heparin (per med rec, pt was taking apixaban PTA, with last dose at 2100 on 07/09/21). Given recent apixaban exposure, will monitor anticoagulation using aPTT until aPTT and heparin levels correlate.  Pt rec'd dose of enoxaparin 40 mg SQ at 0940 AM on 2/1  aPTT 144 sec (On 1450 units/hr) Spoke with nurse, heparin running through PIV and level drawn with femoral.  No signs/symptoms of bleed  Goal of Therapy:  Heparin level 0.3-0.7 units/ml aPTT: 66-102 sec Monitor platelets by anticoagulation protocol: Yes   Plan:  Hold heparin for 1 hour and restart heparin drip at 1300 units/hr Check aPTT/heparin level with am labs Monitor daily aPTT, heparin level, CBC Monitor for bleeding  Thank you for allowing pharmacy to be a part of this patients care.  Donnald Garre, PharmD Clinical Pharmacist  Please check AMION for all [place] numbers After 10:00 PM, call [place] [phone]

## 2021-07-12 DIAGNOSIS — G9340 Encephalopathy, unspecified: Secondary | ICD-10-CM | POA: Diagnosis not present

## 2021-07-12 DIAGNOSIS — J9602 Acute respiratory failure with hypercapnia: Secondary | ICD-10-CM | POA: Diagnosis not present

## 2021-07-12 DIAGNOSIS — J9601 Acute respiratory failure with hypoxia: Secondary | ICD-10-CM | POA: Diagnosis not present

## 2021-07-12 LAB — BASIC METABOLIC PANEL
Anion gap: 9 (ref 5–15)
Anion gap: 9 (ref 5–15)
BUN: 43 mg/dL — ABNORMAL HIGH (ref 8–23)
BUN: 50 mg/dL — ABNORMAL HIGH (ref 8–23)
CO2: 26 mmol/L (ref 22–32)
CO2: 27 mmol/L (ref 22–32)
Calcium: 8.3 mg/dL — ABNORMAL LOW (ref 8.9–10.3)
Calcium: 8.4 mg/dL — ABNORMAL LOW (ref 8.9–10.3)
Chloride: 98 mmol/L (ref 98–111)
Chloride: 101 mmol/L (ref 98–111)
Creatinine, Ser: 1.53 mg/dL — ABNORMAL HIGH (ref 0.61–1.24)
Creatinine, Ser: 1.62 mg/dL — ABNORMAL HIGH (ref 0.61–1.24)
GFR, Estimated: 48 mL/min — ABNORMAL LOW (ref >60)
GFR, Estimated: 45 mL/min — ABNORMAL LOW (ref >60)
Glucose, Bld: 213 mg/dL — ABNORMAL HIGH (ref 70–99)
Glucose, Bld: 217 mg/dL — ABNORMAL HIGH (ref 70–99)
Potassium: 3.9 mmol/L (ref 3.5–5.1)
Potassium: 4 mmol/L (ref 3.5–5.1)
Sodium: 133 mmol/L — ABNORMAL LOW (ref 135–145)
Sodium: 137 mmol/L (ref 135–145)

## 2021-07-12 LAB — APTT
aPTT: 83 seconds — ABNORMAL HIGH (ref 24–36)
aPTT: 61 seconds — ABNORMAL HIGH (ref 24–36)

## 2021-07-12 LAB — CBC
HCT: 33.8 % — ABNORMAL LOW (ref 39.0–52.0)
Hemoglobin: 10.7 g/dL — ABNORMAL LOW (ref 13.0–17.0)
MCH: 28.2 pg (ref 26.0–34.0)
MCHC: 31.7 g/dL (ref 30.0–36.0)
MCV: 89.2 fL (ref 80.0–100.0)
Platelets: 337 10*3/uL (ref 150–400)
RBC: 3.79 MIL/uL — ABNORMAL LOW (ref 4.22–5.81)
RDW: 14.6 % (ref 11.5–15.5)
WBC: 13.9 10*3/uL — ABNORMAL HIGH (ref 4.0–10.5)
nRBC: 0 % (ref 0.0–0.2)

## 2021-07-12 LAB — GLUCOSE, CAPILLARY
Glucose-Capillary: 175 mg/dL — ABNORMAL HIGH (ref 70–99)
Glucose-Capillary: 184 mg/dL — ABNORMAL HIGH (ref 70–99)
Glucose-Capillary: 201 mg/dL — ABNORMAL HIGH (ref 70–99)
Glucose-Capillary: 214 mg/dL — ABNORMAL HIGH (ref 70–99)
Glucose-Capillary: 230 mg/dL — ABNORMAL HIGH (ref 70–99)
Glucose-Capillary: 238 mg/dL — ABNORMAL HIGH (ref 70–99)

## 2021-07-12 LAB — PHOSPHORUS: Phosphorus: 3.5 mg/dL (ref 2.5–4.6)

## 2021-07-12 LAB — MAGNESIUM
Magnesium: 2.6 mg/dL — ABNORMAL HIGH (ref 1.7–2.4)
Magnesium: 2.5 mg/dL — ABNORMAL HIGH (ref 1.7–2.4)

## 2021-07-12 LAB — PROCALCITONIN: Procalcitonin: 1 ng/mL

## 2021-07-12 LAB — HEPARIN LEVEL (UNFRACTIONATED): Heparin Unfractionated: >1.1 IU/mL — ABNORMAL HIGH (ref 0.30–0.70)

## 2021-07-12 MED ORDER — MIDAZOLAM HCL 2 MG/2ML IJ SOLN
2.0000 mg | INTRAMUSCULAR | Status: DC | PRN
  Administered 2021-07-13 (×2): 2 mg via INTRAVENOUS
  Filled 2021-07-12 (×3): qty 2

## 2021-07-12 MED ORDER — MIDAZOLAM HCL 2 MG/2ML IJ SOLN
2.0000 mg | INTRAMUSCULAR | Status: DC | PRN
  Administered 2021-07-13 – 2021-07-15 (×5): 2 mg via INTRAVENOUS
  Filled 2021-07-12 (×4): qty 2

## 2021-07-12 MED ORDER — METHYLPREDNISOLONE SODIUM SUCC 125 MG IJ SOLR
40.0000 mg | Freq: Every day | INTRAMUSCULAR | Status: DC
  Administered 2021-07-13: 40 mg via INTRAVENOUS
  Filled 2021-07-12 (×2): qty 2

## 2021-07-12 MED ORDER — CLONAZEPAM 0.5 MG PO TBDP
0.5000 mg | ORAL_TABLET | Freq: Two times a day (BID) | ORAL | Status: DC
  Administered 2021-07-12 – 2021-07-16 (×9): 0.5 mg
  Filled 2021-07-12 (×9): qty 1

## 2021-07-12 MED ORDER — DOXYCYCLINE HYCLATE 100 MG PO TABS
100.0000 mg | ORAL_TABLET | Freq: Two times a day (BID) | ORAL | Status: DC
  Administered 2021-07-12 – 2021-07-16 (×9): 100 mg
  Filled 2021-07-12 (×11): qty 1

## 2021-07-12 MED ORDER — POLYETHYLENE GLYCOL 3350 17 G PO PACK
17.0000 g | PACK | Freq: Every day | ORAL | Status: DC
  Administered 2021-07-12 – 2021-07-15 (×4): 17 g
  Filled 2021-07-12 (×4): qty 1

## 2021-07-12 MED ORDER — DOCUSATE SODIUM 50 MG/5ML PO LIQD
100.0000 mg | Freq: Two times a day (BID) | ORAL | Status: DC
  Administered 2021-07-12 – 2021-07-15 (×7): 100 mg
  Filled 2021-07-12 (×7): qty 10

## 2021-07-12 MED ORDER — PANTOPRAZOLE 2 MG/ML SUSPENSION
40.0000 mg | Freq: Every day | ORAL | Status: DC
  Administered 2021-07-13 – 2021-07-23 (×10): 40 mg
  Filled 2021-07-12 (×12): qty 20

## 2021-07-12 NOTE — Progress Notes (Signed)
ANTICOAGULATION CONSULT NOTE Pharmacy Consult for IV Heparin Indication: atrial fibrillation  Allergies  Allergen Reactions   Gabapentin Anxiety    Unknown reaction   Metformin And Related Rash    Patient Measurements: Height: 5\' 9"  (175.3 cm) Weight: 90.9 kg (200 lb 6.4 oz) IBW/kg (Calculated) : 70.7 Heparin Dosing Weight: 88 kg  Vital Signs: Temp: 98.8 F (37.1 C) (02/03 1119) Temp Source: Oral (02/03 1119) BP: 107/52 (02/03 1056) Pulse Rate: 86 (02/03 1056)  Labs: Recent Labs    07/10/21 0324 07/10/21 0324 07/10/21 0739 07/10/21 0908 07/10/21 1321 07/10/21 2013 07/10/21 2334 07/11/21 0310 07/11/21 0433 07/11/21 0649 07/11/21 1839 07/12/21 0343 07/12/21 1140  HGB 12.8*  --   --   --   --  13.9  --   --  11.6*  --   --  10.7*  --   HCT 40.5  --   --   --   --  41.0  --   --  34.0*  --   --  33.8*  --   PLT 391  --   --   --   --   --   --   --   --   --   --  337  --   APTT  --    < > 34  --   --   --  96* 53*  --   --  144* 83* 61*  LABPROT  --   --  16.7*  --   --   --   --  16.4*  --   --   --   --   --   INR  --   --  1.4*  --   --   --   --  1.3*  --   --   --   --   --   HEPARINUNFRC  --   --   --   --   --   --  >1.10*  --   --  >1.10*  --  >1.10*  --   CREATININE 1.07  --   --  1.07  --   --   --  1.57*  --   --   --  1.53*  --   TROPONINIHS 37*  --   --  49* 74*  --   --   --   --   --   --   --   --    < > = values in this interval not displayed.     Estimated Creatinine Clearance: 47.9 mL/min (A) (by C-G formula based on SCr of 1.53 mg/dL (H)).   Medical History: Past Medical History:  Diagnosis Date   Anxiety    Asthma    Atrial flutter ([place]) 04/2021   Chronic lower back pain    COPD (chronic obstructive pulmonary disease) (HCC)    Coronary artery disease    a. NSTEMI 05/2014 s/p DESx2 to LAD at [facility].   Depression    Educated about COVID-19 virus infection 03/06/2020   GERD (gastroesophageal reflux disease)    High cholesterol     Hypertension    NSTEMI (non-ST elevated myocardial infarction) ([place]) 05/2014   with stent placement   Sleep apnea    Stroke ([place]) 2017   anyeusym    TIA (transient ischemic attack)    "they say I've had some mini strokes; don't know when"; denies residual on 06/22/2014)   Type II diabetes mellitus ([place])  Ulcerative colitis [facility])     Assessment: 76 yr old man admitted as transfer from [facility] with acute hypoxic and  hypercapnic vent-dependent respiratory failure and atrial tachycardia requiring cardioversion (wide-complex irregular tachy, possibly a fib with aberrancy). Phamacy is consulted to dose IV heparin (per med rec, pt was taking apixaban PTA, with last dose at 2100 on 07/09/21). Given recent apixaban exposure, will monitor anticoagulation using aPTT until aPTT and heparin levels correlate.  Pt rec'd dose of enoxaparin 40 mg SQ at 0940 AM on 2/1  aPTT 61 (On 1300 units/hr) Spoke with nurse, no signs or symptoms of bleeding and no issues during infusion.  Goal of Therapy:  Heparin level 0.3-0.7 units/ml aPTT: 66-102 sec Monitor platelets by anticoagulation protocol: Yes   Plan:  Increase Heparin to 1350 units/h Check aPTT/heparin level with am labs Monitor daily aPTT, heparin level, CBC Monitor for bleeding  Thank you for allowing pharmacy to be a part of this patients care.  Lestine Box, PharmD PGY2 Infectious Diseases Pharmacy Resident   Please check [email or web address] for unit-specific pharmacy phone numbers
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
## 2021-07-12 NOTE — Progress Notes (Signed)
ANTICOAGULATION CONSULT NOTE - Follow Up Consult  Pharmacy Consult for heparin Indication: atrial fibrillation  Labs: Recent Labs    07/10/21 0324 07/10/21 0324 07/10/21 0739 07/10/21 0908 07/10/21 1321 07/10/21 2013 07/10/21 2334 07/11/21 0310 07/11/21 0433 07/11/21 0649 07/11/21 1839 07/12/21 0343  HGB 12.8*  --   --   --   --  13.9  --   --  11.6*  --   --  10.7*  HCT 40.5  --   --   --   --  41.0  --   --  34.0*  --   --  33.8*  PLT 391  --   --   --   --   --   --   --   --   --   --  337  APTT  --    < > 34  --   --   --  96* 53*  --   --  144* 83*  LABPROT  --   --  16.7*  --   --   --   --  16.4*  --   --   --   --   INR  --   --  1.4*  --   --   --   --  1.3*  --   --   --   --   HEPARINUNFRC  --   --   --   --   --   --  >1.10*  --   --  >1.10*  --  >1.10*  CREATININE 1.07  --   --  1.07  --   --   --  1.57*  --   --   --   --   TROPONINIHS 37*  --   --  49* 74*  --   --   --   --   --   --   --    < > = values in this interval not displayed.    Assessment/Plan:  76yo male therapeutic on heparin after rate change. Will continue infusion at current rate of 1300 units/hr and confirm stable with additional PTT.   Wynona Neat, PharmD, BCPS  07/12/2021,4:22 AM

## 2021-07-12 NOTE — TOC Progression Note (Addendum)
Transition of Care [facility]) - Progression Note    Patient Details  Name: DALESSANDRO BALDYGA MRN: [id number] Date of Birth: [birth year]  Transition of Care [facility]) CM/SW Contact  Angelita Ingles, RN Phone Number: 07/12/2021, 9:17 AM  Clinical Narrative:     Transition of Care [facility]) Screening Note   Patient Details  Name: DNAIEL VOLLER Date of Birth: 02/01/1946   Transition of Care [facility]) CM/SW Contact:    Angelita Ingles, RN Phone Number:[phone]  07/12/2021, 9:17 AM    Transition of Care Department [facility]) has reviewed patient and no TOC needs have been identified at this time. We will continue to monitor patient advancement through interdisciplinary progression rounds. If new patient transition needs arise, please place a TOC consult.  Currently unable to complete high risk readmission screen due to patient not medically stable and able to participate and no family available.          Expected Discharge Plan and Services                                                 Social Determinants of Health (SDOH) Interventions    Readmission Risk Interventions Readmission Risk Prevention Plan 09/28/2020  Transportation Screening Complete  HRI or [place] Complete  Social Work Consult for [place] Planning/Counseling Complete  Palliative Care Screening Not Applicable  Medication Review [employer]) Complete  Some recent data might be hidden

## 2021-07-13 DIAGNOSIS — J9602 Acute respiratory failure with hypercapnia: Secondary | ICD-10-CM | POA: Diagnosis not present

## 2021-07-13 DIAGNOSIS — G9340 Encephalopathy, unspecified: Secondary | ICD-10-CM | POA: Diagnosis not present

## 2021-07-13 DIAGNOSIS — J9601 Acute respiratory failure with hypoxia: Secondary | ICD-10-CM | POA: Diagnosis not present

## 2021-07-13 LAB — CBC
HCT: 34.5 % — ABNORMAL LOW (ref 39.0–52.0)
Hemoglobin: 10.8 g/dL — ABNORMAL LOW (ref 13.0–17.0)
MCH: 27.8 pg (ref 26.0–34.0)
MCHC: 31.3 g/dL (ref 30.0–36.0)
MCV: 88.7 fL (ref 80.0–100.0)
Platelets: 365 10*3/uL (ref 150–400)
RBC: 3.89 MIL/uL — ABNORMAL LOW (ref 4.22–5.81)
RDW: 15 % (ref 11.5–15.5)
WBC: 11.9 10*3/uL — ABNORMAL HIGH (ref 4.0–10.5)
nRBC: 0 % (ref 0.0–0.2)

## 2021-07-13 LAB — GLUCOSE, CAPILLARY
Glucose-Capillary: 194 mg/dL — ABNORMAL HIGH (ref 70–99)
Glucose-Capillary: 169 mg/dL — ABNORMAL HIGH (ref 70–99)
Glucose-Capillary: 151 mg/dL — ABNORMAL HIGH (ref 70–99)
Glucose-Capillary: 187 mg/dL — ABNORMAL HIGH (ref 70–99)
Glucose-Capillary: 173 mg/dL — ABNORMAL HIGH (ref 70–99)
Glucose-Capillary: 215 mg/dL — ABNORMAL HIGH (ref 70–99)

## 2021-07-13 LAB — MAGNESIUM: Magnesium: 2.5 mg/dL — ABNORMAL HIGH (ref 1.7–2.4)

## 2021-07-13 LAB — BASIC METABOLIC PANEL
Anion gap: 8 (ref 5–15)
BUN: 55 mg/dL — ABNORMAL HIGH (ref 8–23)
CO2: 29 mmol/L (ref 22–32)
Calcium: 8.6 mg/dL — ABNORMAL LOW (ref 8.9–10.3)
Chloride: 101 mmol/L (ref 98–111)
Creatinine, Ser: 1.56 mg/dL — ABNORMAL HIGH (ref 0.61–1.24)
GFR, Estimated: 47 mL/min — ABNORMAL LOW (ref >60)
Glucose, Bld: 213 mg/dL — ABNORMAL HIGH (ref 70–99)
Potassium: 4.1 mmol/L (ref 3.5–5.1)
Sodium: 138 mmol/L (ref 135–145)

## 2021-07-13 LAB — APTT: aPTT: 52 seconds — ABNORMAL HIGH (ref 24–36)

## 2021-07-13 LAB — HEPARIN LEVEL (UNFRACTIONATED): Heparin Unfractionated: >1.1 IU/mL — ABNORMAL HIGH (ref 0.30–0.70)

## 2021-07-13 LAB — PHOSPHORUS: Phosphorus: 3.1 mg/dL (ref 2.5–4.6)

## 2021-07-13 MED ORDER — APIXABAN 5 MG PO TABS: 5.0000 mg | ORAL_TABLET | Freq: Two times a day (BID) | ORAL | Status: DC

## 2021-07-13 MED ORDER — METOPROLOL TARTRATE 25 MG/10 ML ORAL SUSPENSION: 25.0000 mg | Freq: Two times a day (BID) | ORAL | Status: DC

## 2021-07-13 MED ORDER — INSULIN ASPART 100 UNIT/ML IJ SOLN
0.0000 [IU] | INTRAMUSCULAR | Status: DC
  Administered 2021-07-13: 4 [IU] via SUBCUTANEOUS
  Administered 2021-07-13: 7 [IU] via SUBCUTANEOUS
  Administered 2021-07-13 – 2021-07-15 (×10): 4 [IU] via SUBCUTANEOUS
  Administered 2021-07-15: 3 [IU] via SUBCUTANEOUS
  Administered 2021-07-15 – 2021-07-16 (×4): 4 [IU] via SUBCUTANEOUS
  Administered 2021-07-16 (×2): 3 [IU] via SUBCUTANEOUS
  Administered 2021-07-17: 20:00:00 7 [IU] via SUBCUTANEOUS
  Administered 2021-07-17 (×2): 4 [IU] via SUBCUTANEOUS
  Administered 2021-07-18: 3 [IU] via SUBCUTANEOUS
  Administered 2021-07-18: 01:00:00 15 [IU] via SUBCUTANEOUS
  Administered 2021-07-18: 7 [IU] via SUBCUTANEOUS
  Administered 2021-07-18 – 2021-07-19 (×2): 3 [IU] via SUBCUTANEOUS
  Administered 2021-07-19: 04:00:00 4 [IU] via SUBCUTANEOUS
  Administered 2021-07-19 – 2021-07-20 (×2): 3 [IU] via SUBCUTANEOUS
  Administered 2021-07-20: 20:00:00 4 [IU] via SUBCUTANEOUS
  Administered 2021-07-20 (×2): 3 [IU] via SUBCUTANEOUS
  Administered 2021-07-20 – 2021-07-21 (×2): 4 [IU] via SUBCUTANEOUS
  Administered 2021-07-21: 04:00:00 3 [IU] via SUBCUTANEOUS
  Administered 2021-07-21: 4 [IU] via SUBCUTANEOUS
  Administered 2021-07-21 – 2021-07-22 (×2): 3 [IU] via SUBCUTANEOUS
  Administered 2021-07-22: 4 [IU] via SUBCUTANEOUS
  Administered 2021-07-22 – 2021-07-23 (×2): 3 [IU] via SUBCUTANEOUS
  Administered 2021-07-23: 4 [IU] via SUBCUTANEOUS

## 2021-07-13 MED ORDER — APIXABAN 5 MG PO TABS
5.0000 mg | ORAL_TABLET | Freq: Two times a day (BID) | ORAL | Status: DC
  Administered 2021-07-13 – 2021-07-14 (×4): 5 mg
  Filled 2021-07-13 (×5): qty 1

## 2021-07-13 MED ORDER — FUROSEMIDE 10 MG/ML IJ SOLN
40.0000 mg | Freq: Four times a day (QID) | INTRAMUSCULAR | Status: AC
  Administered 2021-07-13 (×2): 40 mg via INTRAVENOUS
  Filled 2021-07-13 (×2): qty 4

## 2021-07-13 MED ORDER — AMIODARONE HCL 200 MG PO TABS
200.0000 mg | ORAL_TABLET | Freq: Every day | ORAL | Status: DC
  Administered 2021-07-13 – 2021-07-24 (×12): 200 mg
  Filled 2021-07-13 (×12): qty 1

## 2021-07-13 MED ORDER — METOPROLOL TARTRATE 25 MG PO TABS
25.0000 mg | ORAL_TABLET | Freq: Two times a day (BID) | ORAL | Status: DC
  Administered 2021-07-13 – 2021-07-24 (×18): 25 mg
  Filled 2021-07-13 (×20): qty 1

## 2021-07-13 NOTE — Progress Notes (Signed)
[facility] PCCM  Had period of atrial fibrillation with RVR while weaning today Wean from vent aborted Add metoprolol 25mg  po bid in addition to home dose amiodarone Lasix today Plan repeat SBT in AM  Roselie Awkward, MD  PCCM Pager: [phone] Cell: [phone] After 7:00 pm call Elink  [phone]

## 2021-07-13 NOTE — Progress Notes (Addendum)
At about 1139 pt appeared to be in sustained Vtach and agitated with RASS of 3.  Pt was in breathing trial and sedation was at minimal settings. RT switched pt back to [facility] and this RN administered 2mg  of versed and fentanyl bolus.  Pt quickly converted back to SR w BBB. PT never went pulseless during this episode. EKG completed. Lacretia Nicks NP and Dr Tamala Julian promptly came to bedside to assess. Will continue plan of care.   Irven [place], RN

## 2021-07-13 NOTE — Progress Notes (Signed)
ANTICOAGULATION CONSULT NOTE - Follow Up Consult  Pharmacy Consult for heparin Indication: atrial fibrillation  Labs: Recent Labs     0000 07/10/21 0739 07/10/21 0908 07/10/21 1321 07/10/21 2013 07/11/21 0310 07/11/21 0433 07/11/21 2637 07/11/21 1839 07/12/21 0343 07/12/21 1140 07/12/21 2057 07/13/21 0412 07/13/21 0413  HGB  --   --   --   --    < >  --  11.6*  --   --  10.7*  --   --   --  10.8*  HCT  --   --   --   --    < >  --  34.0*  --   --  33.8*  --   --   --  34.5*  PLT  --   --   --   --   --   --   --   --   --  337  --   --   --  365  APTT  --  34  --   --    < > 53*  --   --    < > 83* 61*  --   --  52*  LABPROT  --  16.7*  --   --   --  16.4*  --   --   --   --   --   --   --   --   INR  --  1.4*  --   --   --  1.3*  --   --   --   --   --   --   --   --   HEPARINUNFRC  --   --   --   --    < >  --   --  >1.10*  --  >1.10*  --   --  >1.10*  --   CREATININE   < >  --  1.07  --   --  1.57*  --   --   --  1.53*  --  1.62*  --  1.56*  TROPONINIHS  --   --  49* 74*  --   --   --   --   --   --   --   --   --   --    < > = values in this interval not displayed.    Assessment: 76yo male subtherapeutic on heparin with lower PTT despite increased rate yesterday; no infusion issues or signs of bleeding per RN.  Goal of Therapy:  aPTT 66-102 seconds   Plan:  Will increase heparin infusion by 2-3 units/kg/hr to 1600 units/hr and check level in 8 hours.    Wynona Neat, PharmD, BCPS  07/13/2021,5:04 AM

## 2021-07-13 NOTE — Progress Notes (Signed)
ANTICOAGULATION CONSULT NOTE Pharmacy Consult for IV Heparin >> apixaban Indication: atrial fibrillation  Allergies  Allergen Reactions   Gabapentin Anxiety    Unknown reaction   Metformin And Related Rash    Patient Measurements: Height: 5\' 9"  (175.3 cm) Weight: 88.7 kg (195 lb 8.8 oz) IBW/kg (Calculated) : 70.7 Heparin Dosing Weight: 88 kg  Vital Signs: Temp: 99 F (37.2 C) (02/04 0753) Temp Source: Axillary (02/04 0753) BP: 171/53 (02/04 1030) Pulse Rate: 80 (02/04 1030)  Labs: Recent Labs    07/10/21 1321 07/10/21 2013 07/11/21 0310 07/11/21 0433 07/11/21 0649 07/11/21 1839 07/12/21 0343 07/12/21 1140 07/12/21 2057 07/13/21 0412 07/13/21 0413  HGB  --    < >  --  11.6*  --   --  10.7*  --   --   --  10.8*  HCT  --    < >  --  34.0*  --   --  33.8*  --   --   --  34.5*  PLT  --   --   --   --   --   --  337  --   --   --  365  APTT  --    < > 53*  --   --    < > 83* 61*  --   --  52*  LABPROT  --   --  16.4*  --   --   --   --   --   --   --   --   INR  --   --  1.3*  --   --   --   --   --   --   --   --   HEPARINUNFRC  --    < >  --   --  >1.10*  --  >1.10*  --   --  >1.10*  --   CREATININE  --    < > 1.57*  --   --   --  1.53*  --  1.62*  --  1.56*  TROPONINIHS 74*  --   --   --   --   --   --   --   --   --   --    < > = values in this interval not displayed.     Estimated Creatinine Clearance: 46.5 mL/min (A) (by C-G formula based on SCr of 1.56 mg/dL (H)).   Medical History: Past Medical History:  Diagnosis Date   Anxiety    Asthma    Atrial flutter ([place]) 04/2021   Chronic lower back pain    COPD (chronic obstructive pulmonary disease) (HCC)    Coronary artery disease    a. NSTEMI 05/2014 s/p DESx2 to LAD at [facility].   Depression    Educated about COVID-19 virus infection 03/06/2020   GERD (gastroesophageal reflux disease)    High cholesterol    Hypertension    NSTEMI (non-ST elevated myocardial infarction) ([place]) 05/2014   with stent  placement   Sleep apnea    Stroke ([place]) 2017   anyeusym    TIA (transient ischemic attack)    "they say I've had some mini strokes; don't know when"; denies residual on 06/22/2014)   Type II diabetes mellitus ([place])    Ulcerative colitis [facility])     Assessment: 76 yr old man admitted as transfer from [facility] with acute hypoxic and  hypercapnic vent-dependent respiratory failure and atrial tachycardia requiring cardioversion (  wide-complex irregular tachy, possibly a fib with aberrancy). Phamacy is consulted to dose IV heparin (per med rec, pt was taking apixaban PTA, with last dose at 2100 on 07/09/21). Given recent apixaban exposure, will monitor anticoagulation using aPTT until aPTT and heparin levels correlate.  2/4 - Pharmacy consulted to transition back to apixaban. CBC stable. No bleed issues reported. Noted AKI - SCr stable 1.56. Weight 88.7 kg.  Goal of Therapy:  Stroke prevention Monitor platelets by anticoagulation protocol: Yes   Plan:  D/c heparin at time of 1st dose of apixaban 5mg  PT BID - discussed plan with RN Monitor CBC, SCr, s/sx bleeding   Arturo Morton, PharmD, BCPS Please check AMION for all [place] contact numbers Clinical Pharmacist 07/13/2021 10:43 AM

## 2021-07-13 NOTE — Progress Notes (Signed)
NAME:  Ronald [name], MRN:  [id number], DOB:  [birth year], LOS: 3 ADMISSION DATE:  07/10/2021, CONSULTATION DATE:  2/1 REFERRING MD:  Roger Shelter, CHIEF COMPLAINT:  Cardiac arrest   History of Present Illness:  76 y/o male with COPD and tracheal stenosis presented to the [facility] ER in the setting of agitation and dyspnea felt to be due to a COPD exacerbation.   Pertinent  Medical History  COPD Gold stage III/IV Tracheal stenosistrach 2018 CAD status post DES x2 to LAD 2015 HTN HLD Chronic hypoxic respiratory failure on 3 L nasal cannula at baseline HFpEF Type 2 diabetes   Significant Hospital Events: Including procedures, antibiotic start and stop dates in addition to other pertinent events   2/1 admitted for acute on chronic hypoxic and hypercapnic respiratory failure, intubated on arrival, cefepime, metronidazole started 02/02 Metronidazole, cefepime dc, Unasyn and Doxycycline started  Interim History / Subjective:   No acute events Fentanyl increased this morning Versed off Renal function stable afebrile   Objective   Blood pressure (!) 136/43, pulse 68, temperature 99.2 F (37.3 C), temperature source Axillary, resp. rate (!) 22, height 5\' 9"  (1.753 m), weight 88.7 kg, SpO2 95 %. CVP:  [12 mmHg-13 mmHg] 12 mmHg  Vent Mode: PRVC FiO2 (%):  [40 %-50 %] 50 % Set Rate:  [22 bmp] 22 bmp Vt Set:  [600 mL] 600 mL PEEP:  [5 cmH20] 5 cmH20 Plateau Pressure:  [18 cmH20-26 cmH20] 18 cmH20   Intake/Output Summary (Last 24 hours) at 07/13/2021 0747 Last data filed at 07/13/2021 0700 Gross per 24 hour  Intake 3517.41 ml  Output 725 ml  Net 2792.41 ml   Filed Weights   07/11/21 0233 07/12/21 0338 07/13/21 0500  Weight: 87.2 kg 90.9 kg 88.7 kg    Examination: General:  In bed on vent HENT: NCAT ETT in place PULM: CTA B, vent supported breathing CV: RRR, no mgr GI: BS+, soft, nontender MSK: normal bulk and tone Derm: chronic appearing edema in both arms Neuro: sedated on  vent   Resolved Hospital Problem list   Lactic acidosis Hyponatremia> resolved  Assessment & Plan:  Acute hypoxemic and hypercarbic respiratory failure Acute COPD exacerbation Aspiration pneumonia > very dense on imaging Full mechanical vent support VAP prevention Daily WUA/SBT Attempt pressure support ventilation this morning Continue brovana/yupelri Continue antibiotics as ordered  Paroxysmal atrial fibrillation Afib with RVR Demand ischemia Tele Amiodarone change to oral Eliquis> start today per pharmacy Stop heparin after staring eliquis  AKI > stable Monitor BMET and UOP Replace electrolytes as needed  Hyperglycemia SSI  Pulmonary nodule 2.5 cm, spiculated, RLL> presumably infectious Will need outpatient follow up after clearing within 4-6 weeks of discharge  Malnutrition to moderate degree Tube feeding  Cigarette smoker Counsel to quit  GERD PPI   Best Practice (right click and "Reselect all SmartList Selections" daily)   Diet/type: tubefeeds DVT prophylaxis: systemic heparin GI prophylaxis: PPI Lines: N/A Foley:  N/A Code Status:  full code Last date of multidisciplinary goals of care discussion [2/4 updated son by phone]  Labs   CBC: Recent Labs  Lab 07/10/21 0324 07/10/21 2013 07/11/21 0433 07/12/21 0343 07/13/21 0413  WBC 12.3*  --   --  13.9* 11.9*  NEUTROABS 9.5*  --   --   --   --   HGB 12.8* 13.9 11.6* 10.7* 10.8*  HCT 40.5 41.0 34.0* 33.8* 34.5*  MCV 91.0  --   --  89.2 88.7  PLT 391  --   --  337 315    Basic Metabolic Panel: Recent Labs  Lab 07/10/21 0908 07/10/21 2013 07/11/21 0310 07/11/21 0433 07/11/21 1839 07/12/21 0343 07/12/21 2057 07/13/21 0413  NA 132*   < > 136 134*  --  133* 137 138  K 3.9   < > 4.5 4.8  --  3.9 4.0 4.1  CL 98  --  100  --   --  98 101 101  CO2 26  --  26  --   --  26 27 29   GLUCOSE 220*  --  172*  --   --  213* 217* 213*  BUN 14  --  19  --   --  43* 50* 55*  CREATININE 1.07  --   1.57*  --   --  1.53* 1.62* 1.56*  CALCIUM 7.4*  --  8.0*  --   --  8.3* 8.4* 8.6*  MG  --   --  1.2*  --  2.7* 2.6* 2.5* 2.5*  PHOS  --   --  5.5*  --   --  3.5  --  3.1   < > = values in this interval not displayed.   GFR: Estimated Creatinine Clearance: 46.5 mL/min (A) (by C-G formula based on SCr of 1.56 mg/dL (H)). Recent Labs  Lab 07/10/21 0324 07/10/21 0757 07/10/21 0908 07/10/21 1322 07/11/21 0310 07/12/21 0343 07/13/21 0413  PROCALCITON  --   --  <0.10  --  1.15 1.00  --   WBC 12.3*  --   --   --   --  13.9* 11.9*  LATICACIDVEN  --  3.9* 3.9* 3.5*  --   --   --     Liver Function Tests: Recent Labs  Lab 07/10/21 0908  AST 17  ALT 12  ALKPHOS 82  BILITOT 0.7  PROT 5.2*  ALBUMIN 2.3*   No results for input(s): LIPASE, AMYLASE in the last 168 hours. No results for input(s): AMMONIA in the last 168 hours.  ABG    Component Value Date/Time   PHART 7.266 (L) 07/11/2021 0433   PCO2ART 62.0 (H) 07/11/2021 0433   PO2ART 70 (L) 07/11/2021 0433   HCO3 28.3 (H) 07/11/2021 0433   TCO2 30 07/11/2021 0433   ACIDBASEDEF 4.0 (H) 07/10/2021 2013   O2SAT 91.0 07/11/2021 0433     Coagulation Profile: Recent Labs  Lab 07/10/21 0739 07/11/21 0310  INR 1.4* 1.3*    Cardiac Enzymes: No results for input(s): CKTOTAL, CKMB, CKMBINDEX, TROPONINI in the last 168 hours.  HbA1C: HB A1C (BAYER DCA - WAIVED)  Date/Time Value Ref Range Status  09/12/2020 03:09 PM 6.8 <7.0 % Final    Comment:                                          Diabetic Adult            <7.0                                       Healthy Adult        4.3 - 5.7                                                           (  DCCT/NGSP) American Diabetes Association's Summary of Glycemic Recommendations for Adults with Diabetes: Hemoglobin A1c <7.0%. More stringent glycemic goals (A1c <6.0%) may further reduce complications at the cost of increased risk of hypoglycemia.   12/15/2019 12:52 PM 6.6 <7.0 %  Final    Comment:                                          Diabetic Adult            <7.0                                       Healthy Adult        4.3 - 5.7                                                           (DCCT/NGSP) American Diabetes Association's Summary of Glycemic Recommendations for Adults with Diabetes: Hemoglobin A1c <7.0%. More stringent glycemic goals (A1c <6.0%) may further reduce complications at the cost of increased risk of hypoglycemia.    Hgb A1c MFr Bld  Date/Time Value Ref Range Status  07/10/2021 07:48 AM 6.0 (H) 4.8 - 5.6 % Final    Comment:    (NOTE)         Prediabetes: 5.7 - 6.4         Diabetes: >6.4         Glycemic control for adults with diabetes: <7.0   05/02/2021 03:17 AM 7.5 (H) 4.8 - 5.6 % Final    Comment:    (NOTE) Pre diabetes:          5.7%-6.4%  Diabetes:              >6.4%  Glycemic control for   <7.0% adults with diabetes     CBG: Recent Labs  Lab 07/12/21 1117 07/12/21 1517 07/12/21 2005 07/12/21 2347 07/13/21 0344  GLUCAP 201* 184* 175* 238* 194*    Critical care time: 33 minutes    Roselie Awkward, MD [place] PCCM Pager: [phone] Cell: [phone] After 7:00 pm call Elink  [phone]

## 2021-07-14 DIAGNOSIS — J9601 Acute respiratory failure with hypoxia: Secondary | ICD-10-CM | POA: Diagnosis not present

## 2021-07-14 DIAGNOSIS — J9602 Acute respiratory failure with hypercapnia: Secondary | ICD-10-CM | POA: Diagnosis not present

## 2021-07-14 DIAGNOSIS — G9340 Encephalopathy, unspecified: Secondary | ICD-10-CM | POA: Diagnosis not present

## 2021-07-14 LAB — BASIC METABOLIC PANEL
Anion gap: 9 (ref 5–15)
BUN: 67 mg/dL — ABNORMAL HIGH (ref 8–23)
CO2: 30 mmol/L (ref 22–32)
Calcium: 8.6 mg/dL — ABNORMAL LOW (ref 8.9–10.3)
Chloride: 100 mmol/L (ref 98–111)
Creatinine, Ser: 1.63 mg/dL — ABNORMAL HIGH (ref 0.61–1.24)
GFR, Estimated: 44 mL/min — ABNORMAL LOW (ref >60)
Glucose, Bld: 210 mg/dL — ABNORMAL HIGH (ref 70–99)
Potassium: 4.7 mmol/L (ref 3.5–5.1)
Sodium: 139 mmol/L (ref 135–145)

## 2021-07-14 LAB — GLUCOSE, CAPILLARY
Glucose-Capillary: 195 mg/dL — ABNORMAL HIGH (ref 70–99)
Glucose-Capillary: 198 mg/dL — ABNORMAL HIGH (ref 70–99)
Glucose-Capillary: 186 mg/dL — ABNORMAL HIGH (ref 70–99)
Glucose-Capillary: 186 mg/dL — ABNORMAL HIGH (ref 70–99)
Glucose-Capillary: 200 mg/dL — ABNORMAL HIGH (ref 70–99)
Glucose-Capillary: 168 mg/dL — ABNORMAL HIGH (ref 70–99)

## 2021-07-14 LAB — CBC
HCT: 34.2 % — ABNORMAL LOW (ref 39.0–52.0)
Hemoglobin: 10.7 g/dL — ABNORMAL LOW (ref 13.0–17.0)
MCH: 27.8 pg (ref 26.0–34.0)
MCHC: 31.3 g/dL (ref 30.0–36.0)
MCV: 88.8 fL (ref 80.0–100.0)
Platelets: 349 10*3/uL (ref 150–400)
RBC: 3.85 MIL/uL — ABNORMAL LOW (ref 4.22–5.81)
RDW: 15.3 % (ref 11.5–15.5)
WBC: 10.6 10*3/uL — ABNORMAL HIGH (ref 4.0–10.5)
nRBC: 0 % (ref 0.0–0.2)

## 2021-07-14 LAB — PHOSPHORUS: Phosphorus: 2.9 mg/dL (ref 2.5–4.6)

## 2021-07-14 LAB — MAGNESIUM: Magnesium: 2.2 mg/dL (ref 1.7–2.4)

## 2021-07-14 MED ORDER — PREDNISONE 20 MG PO TABS
20.0000 mg | ORAL_TABLET | Freq: Every day | ORAL | Status: AC
  Administered 2021-07-15 – 2021-07-17 (×3): 20 mg
  Filled 2021-07-14 (×3): qty 1

## 2021-07-14 MED ORDER — SENNA 8.6 MG PO TABS: 1.0000 | ORAL_TABLET | Freq: Once | ORAL | Status: DC

## 2021-07-14 MED ORDER — BISACODYL 10 MG RE SUPP: 10.0000 mg | Freq: Every day | RECTAL | Status: DC | PRN

## 2021-07-14 MED ORDER — DEXMEDETOMIDINE HCL IN NACL 400 MCG/100ML IV SOLN
0.4000 ug/kg/h | INTRAVENOUS | Status: DC
  Administered 2021-07-14: 0.8 ug/kg/h via INTRAVENOUS
  Administered 2021-07-14: 0.4 ug/kg/h via INTRAVENOUS
  Administered 2021-07-14: 0.7 ug/kg/h via INTRAVENOUS
  Administered 2021-07-15: 0.6 ug/kg/h via INTRAVENOUS
  Administered 2021-07-15: 0.8 ug/kg/h via INTRAVENOUS
  Filled 2021-07-14 (×5): qty 100

## 2021-07-14 MED ORDER — HYDRALAZINE HCL 20 MG/ML IJ SOLN
10.0000 mg | Freq: Four times a day (QID) | INTRAMUSCULAR | Status: DC | PRN
  Administered 2021-07-14 – 2021-07-19 (×4): 10 mg via INTRAVENOUS
  Filled 2021-07-14 (×4): qty 1

## 2021-07-14 MED ORDER — LABETALOL HCL 5 MG/ML IV SOLN
20.0000 mg | INTRAVENOUS | Status: DC | PRN
  Administered 2021-07-14 – 2021-07-19 (×7): 20 mg via INTRAVENOUS
  Filled 2021-07-14 (×7): qty 4

## 2021-07-14 MED ORDER — SENNA 8.6 MG PO TABS: 1.0000 | ORAL_TABLET | Freq: Every day | ORAL | Status: DC | PRN

## 2021-07-14 MED ORDER — FENTANYL CITRATE (PF) 100 MCG/2ML IJ SOLN
25.0000 ug | INTRAMUSCULAR | Status: DC | PRN
  Administered 2021-07-15: 50 ug via INTRAVENOUS
  Filled 2021-07-14: qty 2

## 2021-07-14 MED ORDER — INSULIN ASPART 100 UNIT/ML IJ SOLN
5.0000 [IU] | INTRAMUSCULAR | Status: DC
  Administered 2021-07-14 – 2021-07-23 (×44): 5 [IU] via SUBCUTANEOUS

## 2021-07-14 NOTE — Progress Notes (Signed)
eLink Physician-Brief Progress Note Patient Name: Ronald [name] DOB: 07-Nov-1945 MRN: [id number]   Date of Service  07/14/2021  HPI/Events of Note  HTN 196/74(108)   HR 54,  Has been receivingPRN labetalol and still remains hypertensive. Is also on precedex  eICU Interventions  PRN hydralazine ordered.      Intervention Category Major Interventions: Hypertension - evaluation and management  Margaretmary Lombard 07/14/2021, 10:31 PM

## 2021-07-14 NOTE — Progress Notes (Addendum)
NAME:  Ronald [name], MRN:  [id number], DOB:  12-Jan-1946, LOS: 4 ADMISSION DATE:  07/10/2021, CONSULTATION DATE:  2/1 REFERRING MD:  Roger Shelter, CHIEF COMPLAINT:  Cardiac arrest   History of Present Illness:  76 y/o male with COPD and tracheal stenosis presented to the [facility] ER in the setting of agitation and dyspnea felt to be due to a COPD exacerbation.   Pertinent  Medical History  COPD Gold stage III/IV Tracheal stenosistrach 2018 CAD status post DES x2 to LAD 2015 HTN HLD Chronic hypoxic respiratory failure on 3 L nasal cannula at baseline HFpEF Type 2 diabetes   Significant Hospital Events: Including procedures, antibiotic start and stop dates in addition to other pertinent events   2/1 admitted for acute on chronic hypoxic and hypercapnic respiratory failure, intubated on arrival, cefepime, metronidazole started 02/02 Metronidazole, cefepime dc, Unasyn and Doxycycline started 2/4 failed SBT due to atrial fibrillation  2/5 stopped unasyn  Interim History / Subjective:   Had atrial fibrillation with SBT yesterday Glucose elevated  Objective   Blood pressure (!) 153/51, pulse 67, temperature 97.8 F (36.6 C), temperature source Axillary, resp. rate (!) 22, height 5\' 9"  (1.753 m), weight 90.1 kg, SpO2 98 %. CVP:  [13 mmHg-17 mmHg] 13 mmHg  Vent Mode: PRVC FiO2 (%):  [40 %-60 %] 60 % Set Rate:  [22 bmp] 22 bmp Vt Set:  [600 mL] 600 mL PEEP:  [5 cmH20] 5 cmH20 Pressure Support:  [12 cmH20] 12 cmH20 Plateau Pressure:  [16 cmH20-19 cmH20] 16 cmH20   Intake/Output Summary (Last 24 hours) at 07/14/2021 0720 Last data filed at 07/14/2021 0700 Gross per 24 hour  Intake 2988.95 ml  Output 2250 ml  Net 738.95 ml   Filed Weights   07/12/21 0338 07/13/21 0500 07/14/21 0500  Weight: 90.9 kg 88.7 kg 90.1 kg    Examination:  General:  In bed on vent HENT: NCAT ETT in place PULM: Slight wheezing B, vent supported breathing CV: RRR, no mgr GI: BS+, soft, nontender MSK: normal  bulk and tone Neuro: sedated on vent, follows commands   Resolved Hospital Problem list   Lactic acidosis Hyponatremia> resolved  Assessment & Plan:  Acute hypoxemic and hypercarbic respiratory failure > failed SBT due to atrial fibrillation with RVR 2/4, apnea 2/5 Acute COPD exacerbation Aspiration pneumonia > very dense on imaging Full mechanical vent support VAP prevention Daily WUA/SBT Brovana/yupelri today Continue antibiotics as ordered  Change to prednisone 20mg  daily x 3 days Change sedation from fentanyl infusion to precedex, then repeat SBT  Paroxysmal atrial fibrillation Afib with RVR > improved Demand ischemia Tele Amiodarone at home dose Eliquis  AKI > stable Monitor BMET and UOP Replace electrolytes as needed  Hyperglycemia SSI Add tube feeding coverage  Pulmonary nodule 2.5 cm, spiculated, RLL> presumably infectious Will need outpatient follow up ; repeat imaging 4-6 weeks  Malnutrition to moderate degree Tube feedings  Cigarette smoker Counsel to quit  GERD PPI  Lab holiday tomorrow  Best Practice (right click and "Reselect all SmartList Selections" daily)   Diet/type: tubefeeds DVT prophylaxis: systemic heparin GI prophylaxis: PPI Lines: N/A Foley:  N/A Code Status:  full code Last date of multidisciplinary goals of care discussion [2/4 updated son by phone]  Labs   CBC: Recent Labs  Lab 07/10/21 0324 07/10/21 2013 07/11/21 0433 07/12/21 0343 07/13/21 0413 07/14/21 0339  WBC 12.3*  --   --  13.9* 11.9* 10.6*  NEUTROABS 9.5*  --   --   --   --   --  HGB 12.8* 13.9 11.6* 10.7* 10.8* 10.7*  HCT 40.5 41.0 34.0* 33.8* 34.5* 34.2*  MCV 91.0  --   --  89.2 88.7 88.8  PLT 391  --   --  337 365 329    Basic Metabolic Panel: Recent Labs  Lab 07/11/21 0310 07/11/21 0433 07/11/21 1839 07/12/21 0343 07/12/21 2057 07/13/21 0413 07/14/21 0339  NA 136 134*  --  133* 137 138 139  K 4.5 4.8  --  3.9 4.0 4.1 4.7  CL 100  --   --   98 101 101 100  CO2 26  --   --  26 27 29 30   GLUCOSE 172*  --   --  213* 217* 213* 210*  BUN 19  --   --  43* 50* 55* 67*  CREATININE 1.57*  --   --  1.53* 1.62* 1.56* 1.63*  CALCIUM 8.0*  --   --  8.3* 8.4* 8.6* 8.6*  MG 1.2*  --  2.7* 2.6* 2.5* 2.5* 2.2  PHOS 5.5*  --   --  3.5  --  3.1 2.9   GFR: Estimated Creatinine Clearance: 44.8 mL/min (A) (by C-G formula based on SCr of 1.63 mg/dL (H)). Recent Labs  Lab 07/10/21 0324 07/10/21 0757 07/10/21 0908 07/10/21 1322 07/11/21 0310 07/12/21 0343 07/13/21 0413 07/14/21 0339  PROCALCITON  --   --  <0.10  --  1.15 1.00  --   --   WBC 12.3*  --   --   --   --  13.9* 11.9* 10.6*  LATICACIDVEN  --  3.9* 3.9* 3.5*  --   --   --   --     Liver Function Tests: Recent Labs  Lab 07/10/21 0908  AST 17  ALT 12  ALKPHOS 82  BILITOT 0.7  PROT 5.2*  ALBUMIN 2.3*   No results for input(s): LIPASE, AMYLASE in the last 168 hours. No results for input(s): AMMONIA in the last 168 hours.  ABG    Component Value Date/Time   PHART 7.266 (L) 07/11/2021 0433   PCO2ART 62.0 (H) 07/11/2021 0433   PO2ART 70 (L) 07/11/2021 0433   HCO3 28.3 (H) 07/11/2021 0433   TCO2 30 07/11/2021 0433   ACIDBASEDEF 4.0 (H) 07/10/2021 2013   O2SAT 91.0 07/11/2021 0433     Coagulation Profile: Recent Labs  Lab 07/10/21 0739 07/11/21 0310  INR 1.4* 1.3*    Cardiac Enzymes: No results for input(s): CKTOTAL, CKMB, CKMBINDEX, TROPONINI in the last 168 hours.  HbA1C: HB A1C (BAYER DCA - WAIVED)  Date/Time Value Ref Range Status  09/12/2020 03:09 PM 6.8 <7.0 % Final    Comment:                                          Diabetic Adult            <7.0                                       Healthy Adult        4.3 - 5.7                                                           (  DCCT/NGSP) American Diabetes Association's Summary of Glycemic Recommendations for Adults with Diabetes: Hemoglobin A1c <7.0%. More stringent glycemic goals (A1c <6.0%) may  further reduce complications at the cost of increased risk of hypoglycemia.   12/15/2019 12:52 PM 6.6 <7.0 % Final    Comment:                                          Diabetic Adult            <7.0                                       Healthy Adult        4.3 - 5.7                                                           (DCCT/NGSP) American Diabetes Association's Summary of Glycemic Recommendations for Adults with Diabetes: Hemoglobin A1c <7.0%. More stringent glycemic goals (A1c <6.0%) may further reduce complications at the cost of increased risk of hypoglycemia.    Hgb A1c MFr Bld  Date/Time Value Ref Range Status  07/10/2021 07:48 AM 6.0 (H) 4.8 - 5.6 % Final    Comment:    (NOTE)         Prediabetes: 5.7 - 6.4         Diabetes: >6.4         Glycemic control for adults with diabetes: <7.0   05/02/2021 03:17 AM 7.5 (H) 4.8 - 5.6 % Final    Comment:    (NOTE) Pre diabetes:          5.7%-6.4%  Diabetes:              >6.4%  Glycemic control for   <7.0% adults with diabetes     CBG: Recent Labs  Lab 07/13/21 1118 07/13/21 1540 07/13/21 1920 07/13/21 2322 07/14/21 0327  GLUCAP 151* 187* 173* 215* 195*    Critical care time: 30 minutes    Roselie Awkward, MD  PCCM Pager: [phone] Cell: [phone] After 7:00 pm call Elink  [phone]

## 2021-07-15 ENCOUNTER — Ambulatory Visit (INDEPENDENT_AMBULATORY_CARE_PROVIDER_SITE_OTHER): Payer: Medicare HMO

## 2021-07-15 ENCOUNTER — Inpatient Hospital Stay (HOSPITAL_COMMUNITY): Payer: Medicare HMO

## 2021-07-15 DIAGNOSIS — J9602 Acute respiratory failure with hypercapnia: Secondary | ICD-10-CM

## 2021-07-15 DIAGNOSIS — J9622 Acute and chronic respiratory failure with hypercapnia: Secondary | ICD-10-CM | POA: Diagnosis not present

## 2021-07-15 DIAGNOSIS — I110 Hypertensive heart disease with heart failure: Secondary | ICD-10-CM | POA: Diagnosis not present

## 2021-07-15 DIAGNOSIS — F419 Anxiety disorder, unspecified: Secondary | ICD-10-CM

## 2021-07-15 DIAGNOSIS — F17210 Nicotine dependence, cigarettes, uncomplicated: Secondary | ICD-10-CM

## 2021-07-15 DIAGNOSIS — G9341 Metabolic encephalopathy: Secondary | ICD-10-CM

## 2021-07-15 DIAGNOSIS — I5030 Unspecified diastolic (congestive) heart failure: Secondary | ICD-10-CM | POA: Diagnosis not present

## 2021-07-15 DIAGNOSIS — D62 Acute posthemorrhagic anemia: Secondary | ICD-10-CM

## 2021-07-15 DIAGNOSIS — J441 Chronic obstructive pulmonary disease with (acute) exacerbation: Secondary | ICD-10-CM | POA: Diagnosis not present

## 2021-07-15 DIAGNOSIS — F32A Depression, unspecified: Secondary | ICD-10-CM

## 2021-07-15 DIAGNOSIS — K922 Gastrointestinal hemorrhage, unspecified: Secondary | ICD-10-CM

## 2021-07-15 DIAGNOSIS — Z431 Encounter for attention to gastrostomy: Secondary | ICD-10-CM | POA: Diagnosis not present

## 2021-07-15 DIAGNOSIS — G9340 Encephalopathy, unspecified: Secondary | ICD-10-CM | POA: Diagnosis not present

## 2021-07-15 DIAGNOSIS — E119 Type 2 diabetes mellitus without complications: Secondary | ICD-10-CM

## 2021-07-15 DIAGNOSIS — E46 Unspecified protein-calorie malnutrition: Secondary | ICD-10-CM

## 2021-07-15 DIAGNOSIS — I959 Hypotension, unspecified: Secondary | ICD-10-CM

## 2021-07-15 DIAGNOSIS — I4892 Unspecified atrial flutter: Secondary | ICD-10-CM | POA: Diagnosis not present

## 2021-07-15 DIAGNOSIS — J9601 Acute respiratory failure with hypoxia: Secondary | ICD-10-CM

## 2021-07-15 DIAGNOSIS — J9621 Acute and chronic respiratory failure with hypoxia: Secondary | ICD-10-CM | POA: Diagnosis not present

## 2021-07-15 DIAGNOSIS — I2510 Atherosclerotic heart disease of native coronary artery without angina pectoris: Secondary | ICD-10-CM

## 2021-07-15 DIAGNOSIS — I509 Heart failure, unspecified: Secondary | ICD-10-CM | POA: Diagnosis not present

## 2021-07-15 DIAGNOSIS — R69 Illness, unspecified: Secondary | ICD-10-CM | POA: Diagnosis not present

## 2021-07-15 LAB — BODY FLUID CELL COUNT WITH DIFFERENTIAL
Eos, Fluid: 0 %
Lymphs, Fluid: 40 %
Monocyte-Macrophage-Serous Fluid: 49 % — ABNORMAL LOW (ref 50–90)
Neutrophil Count, Fluid: 11 % (ref 0–25)
Total Nucleated Cell Count, Fluid: 327 cu mm (ref 0–1000)

## 2021-07-15 LAB — LACTATE DEHYDROGENASE, PLEURAL OR PERITONEAL FLUID: LD, Fluid: 49 U/L — ABNORMAL HIGH (ref 3–23)

## 2021-07-15 LAB — ALBUMIN: Albumin: 2.3 g/dL — ABNORMAL LOW (ref 3.5–5.0)

## 2021-07-15 LAB — BASIC METABOLIC PANEL
Anion gap: 6 (ref 5–15)
BUN: 59 mg/dL — ABNORMAL HIGH (ref 8–23)
CO2: 29 mmol/L (ref 22–32)
Calcium: 8.8 mg/dL — ABNORMAL LOW (ref 8.9–10.3)
Chloride: 107 mmol/L (ref 98–111)
Creatinine, Ser: 1.14 mg/dL (ref 0.61–1.24)
GFR, Estimated: >60 mL/min (ref >60)
Glucose, Bld: 146 mg/dL — ABNORMAL HIGH (ref 70–99)
Potassium: 4.5 mmol/L (ref 3.5–5.1)
Sodium: 142 mmol/L (ref 135–145)

## 2021-07-15 LAB — PROTEIN, TOTAL: Total Protein: 5.5 g/dL — ABNORMAL LOW (ref 6.5–8.1)

## 2021-07-15 LAB — ALBUMIN, PLEURAL OR PERITONEAL FLUID: Albumin, Fluid: <1.5 g/dL

## 2021-07-15 LAB — LACTATE DEHYDROGENASE: LDH: 160 U/L (ref 98–192)

## 2021-07-15 LAB — GLUCOSE, CAPILLARY
Glucose-Capillary: 157 mg/dL — ABNORMAL HIGH (ref 70–99)
Glucose-Capillary: 160 mg/dL — ABNORMAL HIGH (ref 70–99)
Glucose-Capillary: 196 mg/dL — ABNORMAL HIGH (ref 70–99)
Glucose-Capillary: 132 mg/dL — ABNORMAL HIGH (ref 70–99)
Glucose-Capillary: 167 mg/dL — ABNORMAL HIGH (ref 70–99)

## 2021-07-15 LAB — OCCULT BLOOD X 1 CARD TO LAB, STOOL: Fecal Occult Bld: POSITIVE — AB

## 2021-07-15 LAB — MAGNESIUM: Magnesium: 1.9 mg/dL (ref 1.7–2.4)

## 2021-07-15 LAB — PROTEIN, PLEURAL OR PERITONEAL FLUID: Total protein, fluid: <3 g/dL

## 2021-07-15 MED ORDER — ISOSORBIDE MONONITRATE ER 30 MG PO TB24: 15.0000 mg | ORAL_TABLET | Freq: Every day | ORAL | Status: DC

## 2021-07-15 MED ORDER — ORAL CARE MOUTH RINSE
15.0000 mL | Freq: Two times a day (BID) | OROMUCOSAL | Status: DC
  Administered 2021-07-16 – 2021-07-25 (×19): 15 mL via OROMUCOSAL

## 2021-07-15 MED ORDER — ISOSORBIDE MONONITRATE 20 MG PO TABS
10.0000 mg | ORAL_TABLET | Freq: Two times a day (BID) | ORAL | Status: DC
  Administered 2021-07-15 – 2021-07-16 (×3): 10 mg
  Filled 2021-07-15 (×3): qty 1

## 2021-07-15 NOTE — Progress Notes (Signed)
Pt with increased work of breathing. Pt returned to full support settings at his time. Pt on SBT for one hour on 8/5 40%. RT will continue to monitor and be available as needed.

## 2021-07-15 NOTE — Progress Notes (Signed)
AM dose of Eliqus held per Dr Jeanella Craze order for pending thorancentesis. Also informed Dr Tacy Learn of hemoccult positive stool from last night . No obvious bleeding at present . Will continue to monitor.

## 2021-07-15 NOTE — Procedures (Signed)
Extubation Procedure Note  Patient Details:   Name: Ronald [name] DOB: 29-Sep-1945 MRN: [id number]   Airway Documentation:    Vent end date: 07/15/21 Vent end time: 1430   Evaluation  O2 sats: stable throughout Complications: No apparent complications Patient did tolerate procedure well. Bilateral Breath Sounds: Clear, Diminished   Yes  Pt extubated per physician order. Pt with positive cuff leak and suctioned via ETT/orally prior. Upon extubation pt placed on 3L nasal cannula. Pt able to speak name, give a good cough and no stridor heard at this time.   Sharla Kidney 07/15/2021, 2:36 PM

## 2021-07-15 NOTE — Procedures (Signed)
Thoracentesis  Procedure Note  Ronald [name]  [id number]  November 30, 1945  Date:07/15/21  Time:12:33 PM   Provider Performing:Edoardo Laforte   Procedure: Thoracentesis with imaging guidance (30076)  Indication(s) Pleural Effusion  Consent Risks of the procedure as well as the alternatives and risks of each were explained to the patient and/or caregiver.  Consent for the procedure was obtained and is signed in the bedside chart  Anesthesia Topical only with 1% lidocaine    Time Out Verified patient identification, verified procedure, site/side was marked, verified correct patient position, special equipment/implants available, medications/allergies/relevant history reviewed, required imaging and test results available.   Sterile Technique Maximal sterile technique including full sterile barrier drape, hand hygiene, sterile gown, sterile gloves, mask, hair covering, sterile ultrasound probe cover (if used).  Procedure Description Ultrasound was used to identify appropriate pleural anatomy for placement and overlying skin marked.  Area of drainage cleaned and draped in sterile fashion. Lidocaine was used to anesthetize the skin and subcutaneous tissue.  350 cc's of Straw colored fluid was drained from the right pleural space. Catheter then removed and bandaid applied to site.   Complications/Tolerance None; patient tolerated the procedure well. Chest X-ray is ordered to confirm no post-procedural complication.   EBL Minimal   Specimen(s) Pleural fluid

## 2021-07-15 NOTE — Progress Notes (Addendum)
NAME:  Ronald [name], MRN:  [id number], DOB:  March 03, 1946, LOS: 5 ADMISSION DATE:  07/10/2021, CONSULTATION DATE:  2/1 REFERRING MD:  Roger Shelter, CHIEF COMPLAINT:  Cardiac arrest   History of Present Illness:  76 y/o male with COPD and tracheal stenosis presented to the [facility] ER in the setting of agitation and dyspnea felt to be due to a COPD exacerbation.   Pertinent  Medical History  COPD, severe Tracheal stenosis trach 2018 CAD status post DES x2 to LAD 2015 HTN HLD Chronic hypoxic respiratory failure on 3 L nasal cannula at baseline HFpEF Type 2 diabetes   Significant Hospital Events: Including procedures, antibiotic start and stop dates in addition to other pertinent events   2/1 admitted for acute on chronic hypoxic and hypercapnic respiratory failure, intubated on arrival, cefepime, metronidazole started 02/02 Metronidazole, cefepime dc, Unasyn and Doxycycline started 2/4 failed SBT due to atrial fibrillation  2/5 stopped unasyn  Interim History / Subjective:  Patient able to follow commands, motions that he wants ETT out. Mittens in place.  Objective   Blood pressure (!) 155/51, pulse 64, temperature (!) 97.1 F (36.2 C), temperature source Axillary, resp. rate (!) 22, height 5\' 9"  (1.753 m), weight 86.4 kg, SpO2 96 %.    Vent Mode: PRVC FiO2 (%):  [45 %-60 %] 45 % Set Rate:  [22 bmp] 22 bmp Vt Set:  [600 mL] 600 mL PEEP:  [5 cmH20] 5 cmH20 Plateau Pressure:  [18 cmH20-19 cmH20] 19 cmH20   Intake/Output Summary (Last 24 hours) at 07/15/2021 7408 Last data filed at 07/15/2021 0600 Gross per 24 hour  Intake 2220.09 ml  Output 1785 ml  Net 435.09 ml   Filed Weights   07/13/21 0500 07/14/21 0500 07/15/21 0500  Weight: 88.7 kg 90.1 kg 86.4 kg    Examination: General:  In bed on vent, opens eyes spontaneously, moves all 4 extremities HENT: NCAT ETT in place PULM: Slight wheezing B, vent supported breathing CV: RRR, no m/g/r GI: BS+ in all 4 quadrants, soft,  nontender MSK: normal bulk and tone, 2+ non- pitting edema present bilaterally in upper extremities, stocking in place to lower extremities bilaterally no edema Neuro: sedated on vent, follows commands  Fecal occult + Glucose 150-200s Creatinine 1.63- 1.14, GFR >60, BUN 67-59 Lab holiday CBC WBC 11.9-> 10.6, Hgb 10.8->10.7  Resolved Hospital Problem list   Lactic acidosis Hyponatremia> resolved AKI-resolved  Assessment & Plan:  Acute hypoxemic and hypercarbic respiratory failure > failed SBT due to atrial fibrillation with RVR 2/4, apnea 2/5 Acute COPD exacerbation Aspiration pneumonia > very dense on imaging Full mechanical vent support VAP prevention Daily WUA/SBT Brovana/yupelri today Doxycycline 100 mg, day 4 will continue for 7 days total Change to prednisone 20mg  daily x 3 days, 02/06-02/08 Change sedation from fentanyl infusion to precedex, then repeat SBT IV protonix 40 mg qd  Paroxysmal atrial fibrillation Afib with RVR > improved Demand ischemia Tele PO Amiodarone at home dose Metoprolol tartrate 25 mg BID with hold parameters Holding Eliquis one day for thoracentesis  AKI > resolved Creatinine 1.63-> 1.14, GFR >60, BUN 67-59 Monitor BMET and UOP Replace electrolytes as needed  HTN Episode htn into the 190/60s overnight, given hydral. -restart home medication of isosorbide mononitrate 10 mg BID  Hyperglycemia SSI q 4 hrs Add tube feeding coverage  Pulmonary nodule 2.5 cm, spiculated, RLL> presumably infectious Will need outpatient follow up ; repeat imaging 4-6 weeks  Malnutrition to moderate degree Tube feedings  Cigarette smoker Counsel to  quit  GERD PPI   Best Practice (right click and "Reselect all SmartList Selections" daily)   Diet/type: tubefeeds DVT prophylaxis: systemic heparin GI prophylaxis: PPI Lines: N/A Foley:  N/A Code Status:  full code Last date of multidisciplinary goals of care discussion [2/6 updated son by  phone]  Critical care time: 45 minutes    Tykera Skates M. Maijor Hornig, D.O.  Internal Medicine Resident, PGY-1 [facility]  Pager: [phone] 6:32 AM, 07/15/2021   **Please contact the on call pager after 5 pm and on weekends at [phone].**

## 2021-07-15 NOTE — Procedures (Signed)
Chest [place]:  Bedside chest ultrasound was done which showed moderate to large right-sided pleural effusion and bilateral atelectasis.

## 2021-07-16 DIAGNOSIS — J9621 Acute and chronic respiratory failure with hypoxia: Secondary | ICD-10-CM | POA: Diagnosis not present

## 2021-07-16 DIAGNOSIS — J9602 Acute respiratory failure with hypercapnia: Secondary | ICD-10-CM | POA: Diagnosis not present

## 2021-07-16 DIAGNOSIS — G9340 Encephalopathy, unspecified: Secondary | ICD-10-CM | POA: Diagnosis not present

## 2021-07-16 DIAGNOSIS — J9601 Acute respiratory failure with hypoxia: Secondary | ICD-10-CM | POA: Diagnosis not present

## 2021-07-16 LAB — BASIC METABOLIC PANEL
Anion gap: 13 (ref 5–15)
BUN: 52 mg/dL — ABNORMAL HIGH (ref 8–23)
CO2: 27 mmol/L (ref 22–32)
Calcium: 9 mg/dL (ref 8.9–10.3)
Chloride: 105 mmol/L (ref 98–111)
Creatinine, Ser: 1 mg/dL (ref 0.61–1.24)
GFR, Estimated: >60 mL/min (ref >60)
Glucose, Bld: 121 mg/dL — ABNORMAL HIGH (ref 70–99)
Potassium: 4.7 mmol/L (ref 3.5–5.1)
Sodium: 145 mmol/L (ref 135–145)

## 2021-07-16 LAB — CBC
HCT: 32.9 % — ABNORMAL LOW (ref 39.0–52.0)
Hemoglobin: 10.8 g/dL — ABNORMAL LOW (ref 13.0–17.0)
MCH: 28.6 pg (ref 26.0–34.0)
MCHC: 32.8 g/dL (ref 30.0–36.0)
MCV: 87.3 fL (ref 80.0–100.0)
Platelets: 285 10*3/uL (ref 150–400)
RBC: 3.77 MIL/uL — ABNORMAL LOW (ref 4.22–5.81)
RDW: 15.3 % (ref 11.5–15.5)
WBC: 8.9 10*3/uL (ref 4.0–10.5)
nRBC: 0 % (ref 0.0–0.2)

## 2021-07-16 LAB — GLUCOSE, CAPILLARY
Glucose-Capillary: 119 mg/dL — ABNORMAL HIGH (ref 70–99)
Glucose-Capillary: 119 mg/dL — ABNORMAL HIGH (ref 70–99)
Glucose-Capillary: 136 mg/dL — ABNORMAL HIGH (ref 70–99)
Glucose-Capillary: 137 mg/dL — ABNORMAL HIGH (ref 70–99)
Glucose-Capillary: 189 mg/dL — ABNORMAL HIGH (ref 70–99)
Glucose-Capillary: 199 mg/dL — ABNORMAL HIGH (ref 70–99)
Glucose-Capillary: 90 mg/dL (ref 70–99)

## 2021-07-16 LAB — CULTURE, BLOOD (ROUTINE X 2)
Culture: NO GROWTH
Culture: NO GROWTH
Special Requests: ADEQUATE

## 2021-07-16 MED ORDER — ISOSORBIDE MONONITRATE 10 MG PO TABS
15.0000 mg | ORAL_TABLET | Freq: Two times a day (BID) | ORAL | Status: DC
  Filled 2021-07-16: qty 1.5

## 2021-07-16 MED ORDER — FUROSEMIDE 10 MG/ML IJ SOLN
INTRAMUSCULAR | Status: AC
  Filled 2021-07-16: qty 4

## 2021-07-16 MED ORDER — JEVITY 1.5 CAL/FIBER PO LIQD
1000.0000 mL | ORAL | Status: DC
  Administered 2021-07-16: 23:00:00 1500 mL
  Administered 2021-07-19 – 2021-07-22 (×3): 1000 mL
  Filled 2021-07-16 (×14): qty 1000

## 2021-07-16 MED ORDER — FUROSEMIDE 10 MG/ML IJ SOLN
40.0000 mg | Freq: Once | INTRAMUSCULAR | Status: AC
  Administered 2021-07-16: 40 mg via INTRAVENOUS

## 2021-07-16 MED ORDER — PROSOURCE TF PO LIQD
45.0000 mL | Freq: Two times a day (BID) | ORAL | Status: DC
  Administered 2021-07-16 – 2021-07-23 (×14): 45 mL
  Filled 2021-07-16 (×16): qty 45

## 2021-07-16 MED ORDER — ISOSORBIDE MONONITRATE ER 30 MG PO TB24
30.0000 mg | ORAL_TABLET | Freq: Every day | ORAL | Status: DC
  Administered 2021-07-16: 30 mg via ORAL
  Filled 2021-07-16: qty 1

## 2021-07-16 MED ORDER — APIXABAN 5 MG PO TABS
5.0000 mg | ORAL_TABLET | Freq: Two times a day (BID) | ORAL | Status: DC
  Administered 2021-07-16 – 2021-07-17 (×3): 5 mg via ORAL
  Filled 2021-07-16 (×4): qty 1

## 2021-07-16 MED ORDER — ALPRAZOLAM 0.5 MG PO TABS
0.5000 mg | ORAL_TABLET | Freq: Two times a day (BID) | ORAL | Status: DC | PRN
  Administered 2021-07-16 – 2021-07-17 (×2): 0.5 mg via ORAL
  Filled 2021-07-16 (×2): qty 1

## 2021-07-16 NOTE — Evaluation (Signed)
Clinical/Bedside Swallow Evaluation Patient Details  Name: Ronald [name] MRN: [id number] Date of Birth: 1945-10-24  Today's Date: 07/16/2021 Time: SLP Start Time (ACUTE ONLY): 42 SLP Stop Time (ACUTE ONLY): 0948 SLP Time Calculation (min) (ACUTE ONLY): 16 min  Past Medical History:  Past Medical History:  Diagnosis Date   Anxiety    Asthma    Atrial flutter ([place]) 04/2021   Chronic lower back pain    COPD (chronic obstructive pulmonary disease) ([place])    Coronary artery disease    a. NSTEMI 05/2014 s/p DESx2 to LAD at [facility].   Depression    Educated about COVID-19 virus infection 03/06/2020   GERD (gastroesophageal reflux disease)    High cholesterol    Hypertension    NSTEMI (non-ST elevated myocardial infarction) ([place]) 05/2014   with stent placement   Sleep apnea    Stroke ([place]) 2017   anyeusym    TIA (transient ischemic attack)    "they say I've had some mini strokes; don't know when"; denies residual on 06/22/2014)   Type II diabetes mellitus ([place])    Ulcerative colitis ([place])    Past Surgical History:  Past Surgical History:  Procedure Laterality Date   APPENDECTOMY     BIOPSY  07/20/2020   Procedure: BIOPSY;  Surgeon: Harvel Quale, MD;  [facility];  Service: Gastroenterology;;   CARDIAC CATHETERIZATION  [phone] X 3   CHOLECYSTECTOMY     COLONOSCOPY WITH PROPOFOL N/A 07/20/2020   Procedure: COLONOSCOPY WITH PROPOFOL;  Surgeon: Harvel Quale, MD;  [facility]: [facility];  Service: Gastroenterology;  Laterality: N/A;  1:15   CORONARY ANGIOPLASTY WITH STENT PLACEMENT  05/2014   "2"   ESOPHAGEAL DILATION N/A 07/20/2020   Procedure: ESOPHAGEAL DILATION;  Surgeon: Harvel Quale, MD;  [facility];  Service: Gastroenterology;  Laterality: N/A;   ESOPHAGOGASTRODUODENOSCOPY (EGD) WITH PROPOFOL N/A 07/20/2020   Procedure: ESOPHAGOGASTRODUODENOSCOPY (EGD) WITH PROPOFOL;  Surgeon: Harvel Quale, MD;  [facility]  [facility];  Service: Gastroenterology;  Laterality: N/A;   IR GASTROSTOMY TUBE MOD SED  06/04/2021   LEFT HEART CATH AND CORONARY ANGIOGRAPHY N/A 05/25/2020   Procedure: LEFT HEART CATH AND CORONARY ANGIOGRAPHY;  Surgeon: Martinique, Peter M, MD;  Location: [facility];  Service: Cardiovascular;  Laterality: N/A;   POLYPECTOMY  07/20/2020   Procedure: POLYPECTOMY INTESTINAL;  Surgeon: Montez Morita, Quillian Quince, MD;  Location: [facility];  Service: Gastroenterology;;   TUMOR EXCISION Right ~ 1999   "side of my upper head"   HPI:  Pt is a 76 yo male presenting with agitation and dyspnea felt to be due to COPD exacerbation vs aspiration PNA. ETT 2/1-2/6. CT Chest showed new extensive alveolar infiltrates bilaterally but more so in the RLL. MBS in September 2021 revealed a mild oral dysphagia, pharyngeal phase WNL; regular solids and thin liquids recommended. FEES in December 2022 after intubation recommended Dys 2 solids due to mild oropharyngeal dysphagia with residue and a single instance of sensed aspiration with effective cough. PEG 12/27 due to poor PO intake. PMH also includes: tracheal stenosis s/p trach (since decannulated), COPD, GERD, stroke, CAD, HTN, HLD, DMII    Assessment / Plan / Recommendation  Clinical Impression  Pt has coughing with thin liquid trials, also seemingly associated with increased RR and therefore suspicious for decreased ability to coordinate respirations and swallowing. Coughing is eliminated with nectar thick liquids, with only a single throat clear noted after drinking. Swallowing appeared to be  functional with purees but pt refused anything more solid. Pt may have acute on chronic dysphagia given acute deconditioning, intubation, and decreased respiratory status. Would allow some purees and nectar thick liquids for now, but would f/u with MBS as soon as can be scheduled. SLP Visit Diagnosis: Dysphagia, unspecified (R13.10)    Aspiration Risk  Moderate  aspiration risk    Diet Recommendation Dysphagia 1 (Puree);Nectar-thick liquid   Liquid Administration via: Cup;Straw Medication Administration: Crushed with puree Supervision: Staff to assist with self feeding;Full supervision/cueing for compensatory strategies Compensations: Minimize environmental distractions;Slow rate;Small sips/bites Postural Changes: Seated upright at 90 degrees    Other  Recommendations Oral Care Recommendations: Oral care BID Other Recommendations: Have oral suction available    Recommendations for follow up therapy are one component of a multi-disciplinary discharge planning process, led by the attending physician.  Recommendations may be updated based on patient status, additional functional criteria and insurance authorization.  Follow up Recommendations Skilled nursing-short term rehab (<3 hours/day)      Assistance Recommended at Discharge    Functional Status Assessment  (tba)  Frequency and Duration            Prognosis Prognosis for Safe Diet Advancement: Good      Swallow Study   General HPI: Pt is a 76 yo male presenting with agitation and dyspnea felt to be due to COPD exacerbation vs aspiration PNA. ETT 2/1-2/6. CT Chest showed new extensive alveolar infiltrates bilaterally but more so in the RLL. MBS in September 2021 revealed a mild oral dysphagia, pharyngeal phase WNL; regular solids and thin liquids recommended. FEES in December 2022 after intubation recommended Dys 2 solids due to mild oropharyngeal dysphagia with residue and a single instance of sensed aspiration with effective cough. PEG 12/27 due to poor PO intake. PMH also includes: tracheal stenosis s/p trach (since decannulated), COPD, GERD, stroke, CAD, HTN, HLD, DMII Type of Study: Bedside Swallow Evaluation Previous Swallow Assessment: see HPI Diet Prior to this Study: NPO Temperature Spikes Noted: No Respiratory Status: Nasal cannula History of Recent Intubation: Yes Length of  Intubations (days): 5 days Date extubated: 07/15/21 Behavior/Cognition: Alert;Confused;Requires cueing Oral Cavity Assessment: Within Functional Limits Oral Care Completed by SLP: No Oral Cavity - Dentition: Edentulous Vision: Functional for self-feeding Self-Feeding Abilities: Able to feed self Patient Positioning: Upright in chair Baseline Vocal Quality: Normal Volitional Cough: Strong Volitional Swallow: Able to elicit    Oral/Motor/Sensory Function Overall Oral Motor/Sensory Function: Within functional limits   Ice Chips Ice chips: Not tested   Thin Liquid Thin Liquid: Impaired Presentation: Cup;Self Fed;Straw Pharyngeal  Phase Impairments: Cough - Immediate    Nectar Thick Nectar Thick Liquid: Impaired Presentation: Self Fed;Straw Pharyngeal Phase Impairments: Throat Clearing - Delayed   Honey Thick Honey Thick Liquid: Not tested   Puree Puree: Within functional limits Presentation: Spoon   Solid     Solid: Not tested (pt refused)      Osie Bond., M.A. [place] Pager [phone] Office [phone]  07/16/2021,10:01 AM

## 2021-07-16 NOTE — Progress Notes (Signed)
Pt with increased work of breathing and low SpO2 on 4L oxygen. Pt increased to 6L and scheduled aerosol treatments given at this time. CCM MD made aware of increased O2 needs/WOB. RT will continue to monitor and be available as needed.

## 2021-07-16 NOTE — Progress Notes (Signed)
Patient with nosebleed of small thin blood. Rudi Rummage informed since he is on Eliquis. Will continue to monitor.
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
## 2021-07-16 NOTE — Progress Notes (Addendum)
NAME:  Ronald [name], MRN:  [id number], DOB:  1946/02/17, LOS: 6 ADMISSION DATE:  07/10/2021, CONSULTATION DATE:  2/1 REFERRING MD:  Shahmehdi, CHIEF COMPLAINT:  SOB  History of Present Illness:  76 y/o male with COPD and tracheal stenosis presented to the [facility] in the setting of agitation and dyspnea felt to be due to a COPD exacerbation vs aspiration pneumonia.   Pertinent  Medical History  COPD, severe Tracheal stenosis trach 2018 CAD status post DES x2 to LAD 2015 HTN HLD Chronic hypoxic respiratory failure on 3 L nasal cannula at baseline HFpEF Type 2 diabetes   Significant Hospital Events: Including procedures, antibiotic start and stop dates in addition to other pertinent events   2/1 admitted for acute on chronic hypoxic and hypercapnic respiratory failure, intubated on arrival, cefepime, metronidazole started 02/02 Metronidazole, cefepime dc, Unasyn and Doxycycline started 2/4 failed SBT due to atrial fibrillation  2/5 stopped unasyn 2/6 US showed moderate amount of right-sided pleural effusion, thoracentesis drained 300 cc, patient extubated  Interim History / Subjective:  O/N: BP up to 195/66  Objective   Blood pressure (!) 174/61, pulse 82, temperature 97.9 F (36.6 C), temperature source Oral, resp. rate (!) 32, height 5\' 9"  (1.753 m), weight 86.4 kg, SpO2 93 %.    Vent Mode: PRVC FiO2 (%):  [40 %-45 %] 45 % Set Rate:  [22 bmp] 22 bmp Vt Set:  [600 mL] 600 mL PEEP:  [5 cmH20] 5 cmH20 Pressure Support:  [8 cmH20] 8 cmH20   Intake/Output Summary (Last 24 hours) at 07/16/2021 2800 Last data filed at 07/16/2021 0400 Gross per 24 hour  Intake 1798.16 ml  Output 2445 ml  Net -646.84 ml    Filed Weights   07/13/21 0500 07/14/21 0500 07/15/21 0500  Weight: 88.7 kg 90.1 kg 86.4 kg    Examination: General:  laying in bed, breathing comfortably on 3L [place], oriented to self, but not place or time HENT: NCAT PULM: Slight cracking Bilaterally, normal work of breathing  on 3L [place] CV: RRR,  2/6 systolic murmur, no g/r GI: BS+ in all 4 quadrants, soft, non-tender, PEG tube in place MSK: normal bulk and tone, 2+ pitting edema present bilaterally in upper extremities, stocking in place to lower extremities bilaterally no edema Neuro: oriented to self, not place or time  Pleural fluid: LDH 0.30  Protein <0.5  Glucose 120s Creatinine 1.00 GFR >60, BUN 52 WBC 10.6-8.9 Hgb stable 10.8 Weight 88 kg on admission to 84.3 kg I [facility] Problem list   Lactic acidosis Hyponatremia> resolved AKI-resolved  Assessment & Plan:  Acute hypoxemic and hypercarbic respiratory failure > extubated 02/06 Acute COPD exacerbation Aspiration pneumonia > very dense on imaging Pleural fluid analysis consistent with transudate, likely 2/2 CHF.  -Brovana/yupelri today -Doxycycline 100 mg, day 5 will continue for 7 days total -Change to prednisone 20mg  daily x 3 days, 02/06-02/08 -IV protonix 40 mg qd -IV Lasix 40 mg once  Paroxysmal atrial fibrillation Afib with RVR > improved [place]and ischemia -Tele -PO Amiodarone at home dose -Metoprolol tartrate 25 mg BID with hold parameters -Eliquis 5 mg BID  HTN Episode htn into the 190/60s overnight. -Increase home medication of isosorbide mononitrate 10 to 30 mg BID  Hyperglycemia SSI q 4 hrs Add tube feeding coverage  Pulmonary nodule 2.5 cm, spiculated, RLL> presumably infectious Will need outpatient follow up ; repeat imaging 4-6 weeks  Malnutrition to moderate degree Tube feedings  Cigarette smoker  Counsel to quit  GERD PPI   Best Practice (right click and "Reselect all SmartList Selections" daily)   Diet/type: tubefeeds DVT prophylaxis: systemic heparin GI prophylaxis: PPI Lines: N/A Foley:  N/A Code Status:  full code Last date of multidisciplinary goals of care discussion [2/6 updated son by phone]  Critical care time: 62 minutes    Aanyah Loa M. Tc [place], D.O.  Internal Medicine  Resident, PGY-1 [facility]  Pager: [phone] 6:25 AM, 07/16/2021   **Please contact the on call pager after 5 pm and on weekends at [phone].**
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
## 2021-07-16 NOTE — Progress Notes (Signed)
Initial Nutrition Assessment  DOCUMENTATION CODES:   Not applicable  INTERVENTION:   Tube feeding via PEG: Change to Jevity 1.5 at 60 ml/h (1440 ml per day) Prosource TF 45 ml BID  Provides 2240 kcal, 114 gm protein, 1094 ml free water daily.  NUTRITION DIAGNOSIS:   Inadequate oral intake related to inability to eat as evidenced by NPO status.  Ongoing, diet just advanced  GOAL:   Patient will meet greater than or equal to 90% of their needs  Met with TF  MONITOR:   Vent status, Labs, TF tolerance  REASON FOR ASSESSMENT:   Ventilator, Consult Enteral/tube feeding initiation and management  ASSESSMENT:   76 yo male admitted with SOB, COPD exacerbation, tachycardia requiring cardioversion. PMH includes COPD Gold stage III/IV, tracheal stenosis, tracheostomy, PEG, CAD, HTN, HLD, HF, DM-2, chronic respiratory failure.  Discussed patient in ICU rounds and with RN today. Patient was extubated 2/6. Currently on nasal cannula.   S/P bedside swallow evaluation with SLP today. MBS planned for tomorrow.  Diet advanced to dysphagia 1 (pureed) with nectar thick liquids today. Suspect intake will be suboptimal. PEG in place. Currently receiving Vital AF 1.2 at 75 ml/h to provide 2160 kcal, 135 gm protein, 1460 ml free water daily. Will transition to a standard formula to meet 100% of nutrition needs.  Labs reviewed.  CBG: 199-189  Medications reviewed and include Novolog, prednisone, Protonix.  Diet Order:   Diet Order             DIET - DYS 1 Room service appropriate? No; Fluid consistency: Nectar Thick  Diet effective now                   EDUCATION NEEDS:   No education needs have been identified at this time  Skin:  Skin Assessment: Reviewed RN Assessment  Last BM:  2/7 type 7, rectal tube  Height:   Ht Readings from Last 1 Encounters:  07/10/21 5' 9"  (1.753 m)    Weight:   Wt Readings from Last 1 Encounters:  07/16/21 84.3 kg    BMI:  Body  mass index is 27.44 kg/m.  Estimated Nutritional Needs:   Kcal:  2050-2250  Protein:  110-125 gm  Fluid:  >/= 2.1 L   Lucas Mallow RD, LDN, CNSC Please refer to Amion for contact information.

## 2021-07-16 NOTE — Progress Notes (Signed)
[facility] hospitalist will take over care at [place].

## 2021-07-17 ENCOUNTER — Inpatient Hospital Stay (HOSPITAL_COMMUNITY): Payer: Medicare HMO

## 2021-07-17 ENCOUNTER — Inpatient Hospital Stay (HOSPITAL_COMMUNITY): Payer: Medicare HMO | Admitting: Critical Care Medicine

## 2021-07-17 DIAGNOSIS — G9340 Encephalopathy, unspecified: Secondary | ICD-10-CM | POA: Diagnosis not present

## 2021-07-17 DIAGNOSIS — I480 Paroxysmal atrial fibrillation: Secondary | ICD-10-CM | POA: Diagnosis not present

## 2021-07-17 DIAGNOSIS — J9622 Acute and chronic respiratory failure with hypercapnia: Secondary | ICD-10-CM | POA: Diagnosis not present

## 2021-07-17 DIAGNOSIS — J9621 Acute and chronic respiratory failure with hypoxia: Secondary | ICD-10-CM | POA: Diagnosis not present

## 2021-07-17 DIAGNOSIS — J441 Chronic obstructive pulmonary disease with (acute) exacerbation: Secondary | ICD-10-CM | POA: Diagnosis not present

## 2021-07-17 LAB — POCT I-STAT 7, (LYTES, BLD GAS, ICA,H+H)
Acid-Base Excess: 7 mmol/L — ABNORMAL HIGH (ref 0.0–2.0)
Bicarbonate: 32.8 mmol/L — ABNORMAL HIGH (ref 20.0–28.0)
Calcium, Ion: 1.22 mmol/L (ref 1.15–1.40)
HCT: 30 % — ABNORMAL LOW (ref 39.0–52.0)
Hemoglobin: 10.2 g/dL — ABNORMAL LOW (ref 13.0–17.0)
O2 Saturation: 97 %
Patient temperature: 97.4
Potassium: 4.3 mmol/L (ref 3.5–5.1)
Sodium: 140 mmol/L (ref 135–145)
TCO2: 34 mmol/L — ABNORMAL HIGH (ref 22–32)
pCO2 arterial: 50.5 mmHg — ABNORMAL HIGH (ref 32.0–48.0)
pH, Arterial: 7.418 (ref 7.350–7.450)
pO2, Arterial: 88 mmHg (ref 83.0–108.0)

## 2021-07-17 LAB — CBC WITH DIFFERENTIAL/PLATELET
Abs Immature Granulocytes: 0.17 10*3/uL — ABNORMAL HIGH (ref 0.00–0.07)
Basophils Absolute: 0 10*3/uL (ref 0.0–0.1)
Basophils Relative: 0 %
Eosinophils Absolute: 0.3 10*3/uL (ref 0.0–0.5)
Eosinophils Relative: 2 %
HCT: 37.8 % — ABNORMAL LOW (ref 39.0–52.0)
Hemoglobin: 11.6 g/dL — ABNORMAL LOW (ref 13.0–17.0)
Immature Granulocytes: 2 %
Lymphocytes Relative: 12 %
Lymphs Abs: 1.4 10*3/uL (ref 0.7–4.0)
MCH: 27.4 pg (ref 26.0–34.0)
MCHC: 30.7 g/dL (ref 30.0–36.0)
MCV: 89.2 fL (ref 80.0–100.0)
Monocytes Absolute: 1.4 10*3/uL — ABNORMAL HIGH (ref 0.1–1.0)
Monocytes Relative: 12 %
Neutro Abs: 8.2 10*3/uL — ABNORMAL HIGH (ref 1.7–7.7)
Neutrophils Relative %: 72 %
Platelets: 345 10*3/uL (ref 150–400)
RBC: 4.24 MIL/uL (ref 4.22–5.81)
RDW: 15.5 % (ref 11.5–15.5)
WBC: 11.4 10*3/uL — ABNORMAL HIGH (ref 4.0–10.5)
nRBC: 0 % (ref 0.0–0.2)

## 2021-07-17 LAB — MAGNESIUM: Magnesium: 1.7 mg/dL (ref 1.7–2.4)

## 2021-07-17 LAB — BASIC METABOLIC PANEL
Anion gap: 9 (ref 5–15)
BUN: 40 mg/dL — ABNORMAL HIGH (ref 8–23)
CO2: 33 mmol/L — ABNORMAL HIGH (ref 22–32)
Calcium: 8.7 mg/dL — ABNORMAL LOW (ref 8.9–10.3)
Chloride: 98 mmol/L (ref 98–111)
Creatinine, Ser: 0.99 mg/dL (ref 0.61–1.24)
GFR, Estimated: >60 mL/min (ref >60)
Glucose, Bld: 181 mg/dL — ABNORMAL HIGH (ref 70–99)
Potassium: 4.6 mmol/L (ref 3.5–5.1)
Sodium: 140 mmol/L (ref 135–145)

## 2021-07-17 LAB — CULTURE, RESPIRATORY W GRAM STAIN: Culture: NORMAL

## 2021-07-17 LAB — GLUCOSE, CAPILLARY
Glucose-Capillary: 180 mg/dL — ABNORMAL HIGH (ref 70–99)
Glucose-Capillary: 158 mg/dL — ABNORMAL HIGH (ref 70–99)
Glucose-Capillary: 108 mg/dL — ABNORMAL HIGH (ref 70–99)
Glucose-Capillary: 242 mg/dL — ABNORMAL HIGH (ref 70–99)

## 2021-07-17 MED ORDER — ACETAMINOPHEN 325 MG PO TABS
650.0000 mg | ORAL_TABLET | Freq: Four times a day (QID) | ORAL | Status: DC | PRN
  Administered 2021-07-19 – 2021-07-24 (×4): 650 mg
  Filled 2021-07-17 (×4): qty 2

## 2021-07-17 MED ORDER — PANCRELIPASE (LIP-PROT-AMYL) 10440-39150 UNITS PO TABS
20880.0000 [IU] | ORAL_TABLET | Freq: Once | ORAL | Status: DC
  Filled 2021-07-17: qty 2

## 2021-07-17 MED ORDER — FENTANYL CITRATE (PF) 100 MCG/2ML IJ SOLN
25.0000 ug | INTRAMUSCULAR | Status: DC | PRN
  Administered 2021-07-17: 100 ug via INTRAVENOUS
  Filled 2021-07-17: qty 2

## 2021-07-17 MED ORDER — POLYETHYLENE GLYCOL 3350 17 G PO PACK: 17.0000 g | PACK | Freq: Every day | ORAL | Status: DC

## 2021-07-17 MED ORDER — ACETAZOLAMIDE 250 MG PO TABS
250.0000 mg | ORAL_TABLET | Freq: Four times a day (QID) | ORAL | Status: DC
  Administered 2021-07-18 (×2): 250 mg
  Filled 2021-07-17 (×3): qty 1

## 2021-07-17 MED ORDER — FENTANYL CITRATE (PF) 100 MCG/2ML IJ SOLN: 25.0000 ug | INTRAMUSCULAR | Status: DC | PRN

## 2021-07-17 MED ORDER — ONDANSETRON HCL 4 MG/2ML IJ SOLN: 4.0000 mg | Freq: Four times a day (QID) | INTRAMUSCULAR | Status: DC | PRN

## 2021-07-17 MED ORDER — DOCUSATE SODIUM 50 MG/5ML PO LIQD
100.0000 mg | Freq: Two times a day (BID) | ORAL | Status: DC
  Administered 2021-07-17 – 2021-07-18 (×2): 100 mg
  Filled 2021-07-17 (×2): qty 10

## 2021-07-17 MED ORDER — ACETAZOLAMIDE 250 MG PO TABS
250.0000 mg | ORAL_TABLET | Freq: Four times a day (QID) | ORAL | Status: DC
  Administered 2021-07-17: 250 mg via ORAL
  Filled 2021-07-17 (×4): qty 1

## 2021-07-17 MED ORDER — SUCCINYLCHOLINE CHLORIDE 200 MG/10ML IV SOSY
PREFILLED_SYRINGE | INTRAVENOUS | Status: DC | PRN
  Administered 2021-07-17: 140 mg via INTRAVENOUS

## 2021-07-17 MED ORDER — DEXMEDETOMIDINE HCL IN NACL 400 MCG/100ML IV SOLN
0.0000 ug/kg/h | INTRAVENOUS | Status: AC
  Administered 2021-07-17: 0.6 ug/kg/h via INTRAVENOUS
  Administered 2021-07-17 – 2021-07-18 (×2): 0.4 ug/kg/h via INTRAVENOUS
  Administered 2021-07-19: 0.6 ug/kg/h via INTRAVENOUS
  Filled 2021-07-17 (×5): qty 100

## 2021-07-17 MED ORDER — SENNA 8.6 MG PO TABS: 1.0000 | ORAL_TABLET | Freq: Every day | ORAL | Status: DC | PRN

## 2021-07-17 MED ORDER — NOREPINEPHRINE 4 MG/250ML-% IV SOLN
0.0000 ug/min | INTRAVENOUS | Status: DC
  Administered 2021-07-17: 7 ug/min via INTRAVENOUS
  Filled 2021-07-17 (×2): qty 250

## 2021-07-17 MED ORDER — CLONAZEPAM 0.5 MG PO TBDP
0.5000 mg | ORAL_TABLET | Freq: Two times a day (BID) | ORAL | Status: DC
  Administered 2021-07-17 – 2021-07-19 (×4): 0.5 mg
  Filled 2021-07-17 (×3): qty 1

## 2021-07-17 MED ORDER — PROPOFOL 10 MG/ML IV BOLUS
INTRAVENOUS | Status: DC | PRN
  Administered 2021-07-17: 100 mg via INTRAVENOUS

## 2021-07-17 MED ORDER — ONDANSETRON HCL 4 MG PO TABS: 4.0000 mg | ORAL_TABLET | Freq: Four times a day (QID) | ORAL | Status: DC | PRN

## 2021-07-17 MED ORDER — PANCRELIPASE (LIP-PROT-AMYL) 10440-39150 UNITS PO TABS
20880.0000 [IU] | ORAL_TABLET | Freq: Once | ORAL | Status: AC
  Administered 2021-07-17: 20880 [IU]
  Filled 2021-07-17: qty 2

## 2021-07-17 MED ORDER — FUROSEMIDE 10 MG/ML IJ SOLN
40.0000 mg | Freq: Once | INTRAMUSCULAR | Status: AC
  Administered 2021-07-17: 40 mg via INTRAVENOUS
  Filled 2021-07-17: qty 4

## 2021-07-17 MED ORDER — APIXABAN 5 MG PO TABS
5.0000 mg | ORAL_TABLET | Freq: Two times a day (BID) | ORAL | Status: DC
  Administered 2021-07-17 – 2021-07-24 (×14): 5 mg
  Filled 2021-07-17 (×13): qty 1

## 2021-07-17 MED ORDER — QUETIAPINE FUMARATE 25 MG PO TABS
25.0000 mg | ORAL_TABLET | Freq: Every day | ORAL | Status: DC
  Administered 2021-07-17 – 2021-07-18 (×2): 25 mg
  Filled 2021-07-17 (×2): qty 1

## 2021-07-17 MED ORDER — ACETAZOLAMIDE 250 MG PO TABS
250.0000 mg | ORAL_TABLET | Freq: Four times a day (QID) | ORAL | Status: DC
  Filled 2021-07-17 (×2): qty 1

## 2021-07-17 MED ORDER — MAGNESIUM SULFATE 2 GM/50ML IV SOLN
2.0000 g | Freq: Once | INTRAVENOUS | Status: AC
  Administered 2021-07-17: 2 g via INTRAVENOUS
  Filled 2021-07-17: qty 50

## 2021-07-17 MED ORDER — SODIUM BICARBONATE 650 MG PO TABS
650.0000 mg | ORAL_TABLET | Freq: Once | ORAL | Status: AC
  Administered 2021-07-17: 650 mg
  Filled 2021-07-17: qty 1

## 2021-07-17 MED ORDER — ACETAMINOPHEN 650 MG RE SUPP: 650.0000 mg | Freq: Four times a day (QID) | RECTAL | Status: DC | PRN

## 2021-07-17 MED ORDER — CLONAZEPAM 0.5 MG PO TBDP
0.5000 mg | ORAL_TABLET | Freq: Two times a day (BID) | ORAL | Status: DC
  Filled 2021-07-17: qty 1

## 2021-07-17 MED ORDER — SODIUM CHLORIDE 0.9 % IV SOLN
3.0000 g | Freq: Four times a day (QID) | INTRAVENOUS | Status: AC
  Administered 2021-07-17 – 2021-07-22 (×20): 3 g via INTRAVENOUS
  Filled 2021-07-17 (×20): qty 8

## 2021-07-17 MED ORDER — SODIUM BICARBONATE 650 MG PO TABS: 650.0000 mg | ORAL_TABLET | Freq: Once | ORAL | Status: DC

## 2021-07-17 MED ORDER — FUROSEMIDE 10 MG/ML IJ SOLN
40.0000 mg | Freq: Every day | INTRAMUSCULAR | Status: DC
  Administered 2021-07-17 – 2021-07-21 (×5): 40 mg via INTRAVENOUS
  Filled 2021-07-17 (×5): qty 4

## 2021-07-17 MED ORDER — NOREPINEPHRINE 4 MG/250ML-% IV SOLN
INTRAVENOUS | Status: AC
  Filled 2021-07-17: qty 250

## 2021-07-17 NOTE — TOC Initial Note (Addendum)
Transition of Care [facility]) - Initial/Assessment Note    Patient Details  Name: Ronald [name] MRN: [id number] Date of Birth: [birth year]  Transition of Care [facility]) CM/SW Contact:    Ronald Chars, LCSW Phone Number: 07/17/2021, 2:01 PM  Clinical Narrative:    CSW attempted to meet with pt, pt intubated.  Pt niece Ronald [name] in room, but did not have information, asked CSW to speak with son.  Choice document for SNF left in room.  CSW spoke with son, Ronald [name] and then with daughter in law, Ronald [name].  Pt was discharged to [facility]  12/29 (per epic) and son reports he was there until about one week ago when he was sent home with [facility].  Son confirms that the SNF had told him they would have to start paying copay for pt to stay longer and they could not afford that.  CSW discussed the PT is again recommending SNF now, pt would still be in copay days if he were to return.  At that point, [name] asked CSW to speak to Ronald [name].  According to Ronald [name], pt was supposed to have medicaid, the hospital had set this up, but what came through was family planning medicaid.  Ronald [name] states she is confident that pt is actually eligible for medicaid and she was told by SNF that the hospital filled out the application incorrectly.  CSW agreed to contact financial counseling to get some additional information on this.  CSW informed Ronald [name] of the recommendation for SNF again currently and the possibility that pt is not eligible for medicaid which would necessitate an alternate plan but Ronald [name] was not interested in discussing this as she felt certain pt would get medicaid.    CSW contacted Ronald [name]/Financial counseling and asked her to look into medicaid situation.   TOC will continue to follow.        1500: Response from Ronald/Financial counseling:    I reviewed this patient's account. I was not assigned this patient; he is not part of my workqueue. I'm not showing that anyone screened this patient for medicaid  at [facility]. I'm  assuming when he was at the SNF, they applied for medicaid on his behalf. You're absolutely right, when patients receive family planning it's because they don't qualify for regular medicaid based on their assets or income. We don't make the decision about what type of medicaid they are approved so if the family is upset, I would suggest they contact the social worker with questions.   [name]   Patient Accounting, Financial Navigator              Expected Discharge Plan: Skilled Nursing Facility Barriers to Discharge: Continued Medical Work up, Other (must enter comment), SNF Pending bed offer (financial contraints with paying for SNF-copay days)   Patient Goals and CMS Choice   CMS Medicare.gov Compare Post Acute Care list provided to::  (choice document left in room)    Expected Discharge Plan and Services Expected Discharge Plan: [place] In-house Referral: Clinical Social Work   Post Acute Care Choice:  (TBD) Living arrangements for the past 2 months: Single Family Home                                      Prior Living Arrangements/Services Living arrangements for the past 2 months: Single Family Home Lives with:: Spouse Patient language and  need for interpreter reviewed:: No        Need for Family Participation in Patient Care: Yes (Comment) Care giver support system in place?: Yes (comment) Current home services: Other (comment) (HH in place currently) Criminal Activity/Legal Involvement Pertinent to Current Situation/Hospitalization: No - Comment as needed  Activities of Daily Living   ADL Screening (condition at time of admission) Is the patient deaf or have difficulty hearing?: No Does the patient have difficulty seeing, even when wearing glasses/contacts?: No Does the patient have difficulty concentrating, remembering, or making decisions?: No Does the patient have difficulty dressing or bathing?: Yes Does the  patient have difficulty walking or climbing stairs?: Yes  Permission Sought/Granted                  Emotional Assessment Appearance:: Appears stated age Attitude/Demeanor/Rapport: Unable to Assess Affect (typically observed): Unable to Assess Orientation: :  (currently intebated) Alcohol / Substance Use: Not Applicable Psych Involvement: No (comment)  Admission diagnosis:  Acute respiratory failure ([place]) [J96.00] Endotracheally intubated [Z97.8] Acute on chronic respiratory failure with hypoxia and hypercapnia (HCC) [J68.11, J96.22] Patient Active Problem List   Diagnosis Date Noted   Acute respiratory failure ([place]) 07/10/2021   Sepsis due to pneumonia ([place]) 07/10/2021   Pulmonary nodule 07/10/2021   Malnutrition of moderate degree 05/13/2021   Atrial flutter ([place]) 05/01/2021   Shock circulatory ([place]) 05/01/2021   Acute hypercapnic respiratory failure ([place]) 05/01/2021   Elevated brain natriuretic peptide (BNP) level 12/21/2020   Hypokalemia 12/21/2020   Hyperglycemia due to diabetes mellitus ([place]) 12/21/2020   Hyperlipidemia 12/21/2020   Chest pain 05/23/2020   Controlled substance agreement signed 04/03/2020   Benzodiazepine dependence ([place]) 04/03/2020   Dysphagia 02/27/2020   Tracheal stenosis 12/09/2019   Lymphedema 04/01/2019   CHF (congestive heart failure) ([place]) 08/27/2018   Aortic atherosclerosis ([place]) 05/11/2017   Depression 05/05/2017   GERD (gastroesophageal reflux disease) 01/16/2017   Diabetic neuropathy ([place]) 01/16/2017   Anterolisthesis 12/04/2016   History of MI (myocardial infarction) 12/04/2016   OSA (obstructive sleep apnea) 12/04/2016   Closed fracture dislocation of lumbar spine ([place]) 07/25/2016   Closed fracture of body of sternum 07/25/2016   Multiple fractures of cervical spine ([place]) 07/25/2016   Anxiety 02/14/2016   Chronic respiratory failure with hypoxia ([place]) 07/19/2014   Essential hypertension 07/19/2014   Cigarette smoker 07/19/2014    Coronary artery disease 06/22/2014   Diabetes mellitus ([place]) 06/22/2014   COPD exacerbation ([place]) 06/22/2014   Tobacco abuse 06/22/2014   Acute encephalopathy 06/22/2014   Diabetes mellitus type 2 in obese ([place]) 05/11/2014   Simple chronic bronchitis ([place]) 05/11/2014   PCP:  [name], FNP Pharmacy:   [facility] [address] [place] [address] [place] [address] Phone: [phone] Fax: [phone]     Social Determinants of Health (SDOH) Interventions    Readmission Risk Interventions Readmission Risk Prevention Plan 09/28/2020  Transportation Screening Complete  HRI or Home Care Consult Complete  Social Work Consult for [place] Planning/Counseling Complete  Palliative Care Screening Not Applicable  Medication Review Press photographer) Complete  Some recent data might be hidden

## 2021-07-17 NOTE — Progress Notes (Addendum)
Nutrition Follow-up  DOCUMENTATION CODES:   Not applicable  INTERVENTION:   Continue tube feeding via PEG: Jevity 1.5 at 60 ml/h (1440 ml per day) Prosource TF 45 ml BID  Provides 2240 kcal, 114 gm protein, 1094 ml free water daily.  NUTRITION DIAGNOSIS:   Inadequate oral intake related to inability to eat as evidenced by NPO status.  Ongoing  GOAL:   Patient will meet greater than or equal to 90% of their needs  Met with TF  MONITOR:   Vent status, Labs, TF tolerance  REASON FOR ASSESSMENT:   Ventilator, Consult Enteral/tube feeding initiation and management  ASSESSMENT:   76 yo male admitted with SOB, COPD exacerbation, tachycardia requiring cardioversion. PMH includes COPD Gold stage III/IV, tracheal stenosis, tracheostomy, PEG, CAD, HTN, HLD, HF, DM-2, chronic respiratory failure.  Discussed patient in ICU rounds and with RN today. Patient was extubated 2/6. Re-intubated this morning.  Currently requiring Levophed and Precedex.  PEG in place. TF held for intubation, resuming later today per RN. Currently TF order: Jevity 1.5 at 60 ml/h with Prosource TF 45 ml BID to provide 2240 kcal, 114 gm protein, 1094 ml free water daily. Tolerating TF change without difficulty yesterday.    Patient is currently intubated on ventilator support MV: 14.6 L/min Temp (24hrs), Avg:98.4 F (36.9 C), Min:97.5 F (36.4 C), Max:99.6 F (37.6 C) MAP range 66-90 this morning  Labs reviewed.  CBG: 180 this AM  Medications reviewed and include Colace, Lasix, Novolog, prednisone, Protonix, Miralax, Precedex, Mag sulfate, Levophed.  Diet Order:   Diet Order             Diet NPO time specified  Diet effective now                   EDUCATION NEEDS:   No education needs have been identified at this time  Skin:  Skin Assessment: Reviewed RN Assessment  Last BM:  2/7 type 7, rectal tube  Height:   Ht Readings from Last 1 Encounters:  07/10/21 _0  (1.753 m)     Weight:   Wt Readings from Last 1 Encounters:  07/16/21 84.3 kg    BMI:  Body mass index is 27.44 kg/m.  Estimated Nutritional Needs:   Kcal:  2050-2250  Protein:  110-125 gm  Fluid:  >/= 2.1 L   Lucas Mallow RD, LDN, CNSC Please refer to Amion for contact information.

## 2021-07-17 NOTE — Progress Notes (Addendum)
NAME:  Ronald [name], MRN:  [id number], DOB:  01/01/1946, LOS: 7 ADMISSION DATE:  07/10/2021, CONSULTATION DATE:  2/1 REFERRING MD:  Shahmehdi, CHIEF COMPLAINT:  SOB  History of Present Illness:  76 y/o male with COPD and tracheal stenosis presented to the [facility] ER in the setting of agitation and dyspnea felt to be due to a COPD exacerbation vs aspiration pneumonia. He was extubated 2/6, but then became agitated with elevated blood pressure leading to pulmonary edema and hypoxic respiratory failure.  Pertinent  Medical History  COPD, severe Tracheal stenosis trach 2018 CAD status post DES x2 to LAD 2015 HTN HLD Chronic hypoxic respiratory failure on 3 L nasal cannula at baseline HFpEF Type 2 diabetes   Significant Hospital Events: Including procedures, antibiotic start and stop dates in addition to other pertinent events   2/1 admitted for acute on chronic hypoxic and hypercapnic respiratory failure, intubated on arrival, cefepime, metronidazole started 02/02 Metronidazole, cefepime dc, Unasyn and Doxycycline started 2/4 failed SBT due to atrial fibrillation  2/5 stopped unasyn 2/6 US showed moderate amount of right-sided pleural effusion, thoracentesis drained 300 cc, patient extubated 2/8 agitated, HTN into 200-130s, Lasix given, pulmonary edema reintubated  Interim History / Subjective:  O/N: BP up to 214/138, HR 132, became hypoxic into the 70s on NRB, patient reintubated. Given 40 units furosemide. Hypotensive requiring levophed.  Objective   Blood pressure (!) 126/54, pulse 78, temperature 98.4 F (36.9 C), temperature source Oral, resp. rate (!) 29, height 5\' 9"  (1.753 m), weight 84.3 kg, SpO2 100 %.    Vent Mode: PRVC FiO2 (%):  [100 %] 100 % Set Rate:  [26 bmp] 26 bmp Vt Set:  [560 mL] 560 mL PEEP:  [5 cmH20-8 cmH20] 8 cmH20 Plateau Pressure:  [23 cmH20-25 cmH20] 25 cmH20   Intake/Output Summary (Last 24 hours) at 07/17/2021 0744 Last data filed at 07/17/2021 0630 Gross per  24 hour  Intake 745 ml  Output 2450 ml  Net -1705 ml    Filed Weights   07/14/21 0500 07/15/21 0500 07/16/21 0500  Weight: 90.1 kg 86.4 kg 84.3 kg    Examination: General:  laying in bed, intubated, no sedation HENT: NCAT, dried blood present PULM: Slight cracking Bilaterally, mechanically ventilated CV: RRR,  2/6 systolic murmur, no g/r GI: BS+ in all 4 quadrants, soft, non-tender, PEG tube in place MSK: normal bulk and tone, 2+ pitting edema present bilaterally in upper extremities, stocking in place to lower extremities bilaterally no edema Neuro: intubated, no sedation  O: 2450 N-1705 pH 7.4/ CO2 50.5/ O2 88/ Bicarb 32.8 Mag 1.7 Creatinine 0.99 GFR >60, BUN 40 WBC 8.9-11.4 Hgb stable 11.6  Resolved Hospital Problem list   Lactic acidosis Hyponatremia> resolved AKI-resolved  Assessment & Plan:  Acute hypoxemic and hypercarbic respiratory failure > extubated 2/6, reintubated 2/8 Acute COPD exacerbation Aspiration pneumonia Patient was extubated 2/6 then developed acute hypoxic respiratory failure around 500 2/8, placed on NRB then became less responsive and was intubated.  -Concern for aspiration event, will broaden to unasyn 3g -Brovana/yupelri today -Doxycycline 100 mg, day 6 -Change to prednisone 20mg  daily x 3 days, 02/06-02/08 -IV protonix 40 mg qd -IV Lasix 40 mg twice today, then scheduled 40 mg daily -PO Acetazolamide   Paroxysmal atrial fibrillation Afib with RVR > improved Demand ischemia -Tele -PO Amiodarone at home dose -Metoprolol tartrate 25 mg BID with hold parameters -Eliquis 5 mg BID  HTN Episode htn into 200s/130s this AM. He developed pulmonary edema and  acute hypoxic respiratory failure on NRB. Was intubated, currently requiring levophed -Holding home medication of isosorbide mononitrate 30 mg BID  Hyperglycemia Glucose 140s-180s SSI q 4 hrs Add tube feeding coverage  Pulmonary nodule 2.5 cm, spiculated, RLL> presumably  infectious Will need outpatient follow up ; repeat imaging 4-6 weeks  Malnutrition to moderate degree Tube feedings  Cigarette smoker Counsel to quit  GERD PPI   Best Practice (right click and "Reselect all SmartList Selections" daily)   Diet/type: tubefeeds DVT prophylaxis: systemic heparin GI prophylaxis: PPI Lines: N/A Foley:  N/A Code Status:  full code Last date of multidisciplinary goals of care discussion [2/6 updated son by phone]  Critical care time: 76 minutes    Ryle Buscemi M. Jemaine Prokop, D.O.  Internal Medicine Resident, PGY-1 [facility]  Pager: [phone] 7:44 AM, 07/17/2021   **Please contact the on call pager after 5 pm and on weekends at [phone].**

## 2021-07-17 NOTE — Procedures (Signed)
Arterial Catheter Insertion Procedure Note  Ronald [name]  [id number]  [birth year]  Date:07/17/21  Time:2:31 PM    Provider Performing: Jacky Kindle    Procedure: Insertion of Arterial Line [phone]) with US guidance ([id number])   Indication(s) Blood pressure monitoring and/or need for frequent ABGs  Consent Risks of the procedure as well as the alternatives and risks of each were explained to the patient and/or caregiver.  Consent for the procedure was obtained and is signed in the bedside chart  Anesthesia None   Time Out Verified patient identification, verified procedure, site/side was marked, verified correct patient position, special equipment/implants available, medications/allergies/relevant history reviewed, required imaging and test results available.   Sterile Technique Maximal sterile technique including full sterile barrier drape, hand hygiene, sterile gown, sterile gloves, mask, hair covering, sterile ultrasound probe cover (if used).   Procedure Description Area of catheter insertion was cleaned with chlorhexidine and draped in sterile fashion. With real-time ultrasound guidance an arterial catheter was placed into the left  Axillary  artery.  Appropriate arterial tracings confirmed on monitor.     Complications/Tolerance None; patient tolerated the procedure well.   EBL Minimal   Specimen(s) None

## 2021-07-17 NOTE — Progress Notes (Signed)
SLP Cancellation Note  Patient Details Name: Ronald [name] MRN: [id number] DOB: 1945/07/19   Cancelled treatment:       Reason Eval/Treat Not Completed: Other (comment);Medical issues which prohibited therapy (Patient was intubated this AM. SLP to follow for patient readiness for MBS)   Sonia Baller, MA, CCC-SLP Speech Therapy

## 2021-07-17 NOTE — Anesthesia Procedure Notes (Signed)
Procedure Name: Intubation Date/Time: 07/17/2021 6:48 AM Performed by: Wilburn Cornelia, CRNA Pre-anesthesia Checklist: Patient identified, Emergency Drugs available, Suction available, Patient being monitored and Timeout performed Patient Re-evaluated:Patient Re-evaluated prior to induction Oxygen Delivery Method: Circle system utilized Preoxygenation: Pre-oxygenation with 100% oxygen Induction Type: IV induction and Rapid sequence Grade View: Grade I Tube type: Oral Tube size: 7.5 mm Number of attempts: 1 Airway Equipment and Method: Video-laryngoscopy and Rigid stylet Placement Confirmation: ETT inserted through vocal cords under direct vision, positive ETCO2, CO2 detector and breath sounds checked- equal and bilateral Secured at: 23 cm Tube secured with: Tape Dental Injury: Teeth and Oropharynx as per pre-operative assessment

## 2021-07-18 ENCOUNTER — Inpatient Hospital Stay: Payer: Self-pay

## 2021-07-18 ENCOUNTER — Inpatient Hospital Stay (HOSPITAL_COMMUNITY): Payer: Medicare HMO

## 2021-07-18 DIAGNOSIS — J9622 Acute and chronic respiratory failure with hypercapnia: Secondary | ICD-10-CM | POA: Diagnosis not present

## 2021-07-18 DIAGNOSIS — J9621 Acute and chronic respiratory failure with hypoxia: Secondary | ICD-10-CM | POA: Diagnosis not present

## 2021-07-18 DIAGNOSIS — J441 Chronic obstructive pulmonary disease with (acute) exacerbation: Secondary | ICD-10-CM | POA: Diagnosis not present

## 2021-07-18 DIAGNOSIS — J189 Pneumonia, unspecified organism: Secondary | ICD-10-CM | POA: Diagnosis not present

## 2021-07-18 LAB — CBC
HCT: 31.1 % — ABNORMAL LOW (ref 39.0–52.0)
Hemoglobin: 10 g/dL — ABNORMAL LOW (ref 13.0–17.0)
MCH: 28.1 pg (ref 26.0–34.0)
MCHC: 32.2 g/dL (ref 30.0–36.0)
MCV: 87.4 fL (ref 80.0–100.0)
Platelets: 276 10*3/uL (ref 150–400)
RBC: 3.56 MIL/uL — ABNORMAL LOW (ref 4.22–5.81)
RDW: 15.4 % (ref 11.5–15.5)
WBC: 7.7 10*3/uL (ref 4.0–10.5)
nRBC: 0 % (ref 0.0–0.2)

## 2021-07-18 LAB — BASIC METABOLIC PANEL
Anion gap: 10 (ref 5–15)
BUN: 40 mg/dL — ABNORMAL HIGH (ref 8–23)
CO2: 26 mmol/L (ref 22–32)
Calcium: 8.3 mg/dL — ABNORMAL LOW (ref 8.9–10.3)
Chloride: 103 mmol/L (ref 98–111)
Creatinine, Ser: 0.98 mg/dL (ref 0.61–1.24)
GFR, Estimated: >60 mL/min (ref >60)
Glucose, Bld: 303 mg/dL — ABNORMAL HIGH (ref 70–99)
Potassium: 4.3 mmol/L (ref 3.5–5.1)
Sodium: 139 mmol/L (ref 135–145)

## 2021-07-18 LAB — GLUCOSE, CAPILLARY
Glucose-Capillary: 103 mg/dL — ABNORMAL HIGH (ref 70–99)
Glucose-Capillary: 125 mg/dL — ABNORMAL HIGH (ref 70–99)
Glucose-Capillary: 144 mg/dL — ABNORMAL HIGH (ref 70–99)
Glucose-Capillary: 201 mg/dL — ABNORMAL HIGH (ref 70–99)
Glucose-Capillary: 314 mg/dL — ABNORMAL HIGH (ref 70–99)
Glucose-Capillary: 326 mg/dL — ABNORMAL HIGH (ref 70–99)
Glucose-Capillary: 84 mg/dL (ref 70–99)
Glucose-Capillary: 95 mg/dL (ref 70–99)

## 2021-07-18 LAB — POCT I-STAT 7, (LYTES, BLD GAS, ICA,H+H)
Acid-Base Excess: 6 mmol/L — ABNORMAL HIGH (ref 0.0–2.0)
Bicarbonate: 30.9 mmol/L — ABNORMAL HIGH (ref 20.0–28.0)
Calcium, Ion: 1.23 mmol/L (ref 1.15–1.40)
HCT: 30 % — ABNORMAL LOW (ref 39.0–52.0)
Hemoglobin: 10.2 g/dL — ABNORMAL LOW (ref 13.0–17.0)
O2 Saturation: 91 %
Patient temperature: 98.3
Potassium: 3.8 mmol/L (ref 3.5–5.1)
Sodium: 141 mmol/L (ref 135–145)
TCO2: 32 mmol/L (ref 22–32)
pCO2 arterial: 46.4 mmHg (ref 32.0–48.0)
pH, Arterial: 7.431 (ref 7.350–7.450)
pO2, Arterial: 59 mmHg — ABNORMAL LOW (ref 83.0–108.0)

## 2021-07-18 LAB — PATHOLOGIST SMEAR REVIEW

## 2021-07-18 LAB — PHOSPHORUS: Phosphorus: 4.2 mg/dL (ref 2.5–4.6)

## 2021-07-18 LAB — MAGNESIUM: Magnesium: 2.2 mg/dL (ref 1.7–2.4)

## 2021-07-18 MED ORDER — ORAL CARE MOUTH RINSE
15.0000 mL | OROMUCOSAL | Status: DC
  Administered 2021-07-18 (×8): 15 mL via OROMUCOSAL

## 2021-07-18 MED ORDER — SODIUM CHLORIDE 0.9% FLUSH
10.0000 mL | INTRAVENOUS | Status: DC | PRN
  Administered 2021-07-25: 20 mL

## 2021-07-18 MED ORDER — GUAIFENESIN ER 600 MG PO TB12
600.0000 mg | ORAL_TABLET | Freq: Two times a day (BID) | ORAL | Status: AC | PRN
  Filled 2021-07-18: qty 1

## 2021-07-18 MED ORDER — SODIUM CHLORIDE 0.9% FLUSH
10.0000 mL | Freq: Two times a day (BID) | INTRAVENOUS | Status: DC
  Administered 2021-07-18 – 2021-07-25 (×12): 10 mL

## 2021-07-18 MED ORDER — LORAZEPAM 2 MG/ML IJ SOLN
1.0000 mg | Freq: Once | INTRAMUSCULAR | Status: AC
  Administered 2021-07-19: 1 mg via INTRAVENOUS
  Filled 2021-07-18: qty 1

## 2021-07-18 MED ORDER — INSULIN GLARGINE-YFGN 100 UNIT/ML ~~LOC~~ SOLN
10.0000 [IU] | Freq: Every day | SUBCUTANEOUS | Status: DC
  Administered 2021-07-18 – 2021-07-22 (×5): 10 [IU] via SUBCUTANEOUS
  Filled 2021-07-18 (×7): qty 0.1

## 2021-07-18 MED ORDER — SALINE SPRAY 0.65 % NA SOLN
1.0000 | NASAL | Status: DC | PRN
  Administered 2021-07-18 – 2021-07-19 (×2): 1 via NASAL
  Filled 2021-07-18: qty 44

## 2021-07-18 NOTE — Progress Notes (Signed)
Peripherally Inserted Central Catheter Placement  The IV Nurse has discussed with the patient and/or persons authorized to consent for the patient, the purpose of this procedure and the potential benefits and risks involved with this procedure.  The benefits include less needle sticks, lab draws from the catheter, and the patient may be discharged home with the catheter. Risks include, but not limited to, infection, bleeding, blood clot (thrombus formation), and puncture of an artery; nerve damage and irregular heartbeat and possibility to perform a PICC exchange if needed/ordered by physician.  Alternatives to this procedure were also discussed.  Bard Power PICC patient education guide, fact sheet on infection prevention and patient information card has been provided to patient /or left at bedside.    PICC Placement Documentation  PICC Double Lumen 94/85/46 Left Basilic 43 cm 0 cm (Active)  Indication for Insertion or Continuance of Line Poor Vasculature-patient has had multiple peripheral attempts or PIVs lasting less than 24 hours 07/18/21 1603  Exposed Catheter (cm) 0 cm 07/18/21 1603  Site Assessment Clean;Dry;Intact 07/18/21 1603  Lumen #1 Status Flushed;Saline locked;Blood return noted 07/18/21 1603  Lumen #2 Status Flushed;Saline locked;Blood return noted 07/18/21 1603  Dressing Type Transparent;Securing device 07/18/21 1603  Dressing Status Clean;Dry;Intact 07/18/21 1603  Antimicrobial disc in place? Yes 07/18/21 1603  Safety Lock Not Applicable 27/03/50 0938  Line Adjustment (NICU/IV Team Only) No 07/18/21 1603  Dressing Intervention New dressing;Other (Comment) 07/18/21 1603  Dressing Change Due 07/25/21 07/18/21 1603    RUA more swollen than LUA. Patient's son signed consent.   Enos Fling 07/18/2021, 4:05 PM

## 2021-07-18 NOTE — Progress Notes (Signed)
NAME:  Ronald [name], MRN:  [id number], DOB:  1945-08-07, LOS: 8 ADMISSION DATE:  07/10/2021, CONSULTATION DATE:  2/1 REFERRING MD:  Shahmehdi, CHIEF COMPLAINT:  SOB  History of Present Illness:  76 y/o male with COPD and tracheal stenosis presented to the [facility] ER in the setting of agitation and dyspnea felt to be due to a COPD exacerbation vs aspiration pneumonia. He was extubated 2/6, but then became agitated with elevated blood pressure leading to pulmonary edema and hypoxic respiratory failure.  Pertinent  Medical History  COPD, severe Tracheal stenosis trach 2018 CAD status post DES x2 to LAD 2015 HTN HLD Chronic hypoxic respiratory failure on 3 L nasal cannula at baseline HFpEF Type 2 diabetes   Significant Hospital Events: Including procedures, antibiotic start and stop dates in addition to other pertinent events   2/1 admitted for acute on chronic hypoxic and hypercapnic respiratory failure, intubated on arrival, cefepime, metronidazole started 02/02 Metronidazole, cefepime dc, Unasyn and Doxycycline started 2/4 failed SBT due to atrial fibrillation  2/5 stopped unasyn 2/6 US showed moderate amount of right-sided pleural effusion, thoracentesis drained 300 cc, patient extubated 2/8 agitated, HTN into 200-130s, Lasix given, pulmonary edema reintubated  Interim History / Subjective:  O/N: HR in 50s overnight, patient remains intubated and sedated. Glucose into 320s.  Objective   Blood pressure (!) 73/40, pulse (!) 55, temperature 98.2 F (36.8 C), temperature source Oral, resp. rate (!) 23, height 5\' 9"  (1.753 m), weight 81.4 kg, SpO2 98 %.    Vent Mode: PRVC FiO2 (%):  [40 %-100 %] 40 % Set Rate:  [26 bmp] 26 bmp Vt Set:  [560 mL] 560 mL PEEP:  [5 cmH20-8 cmH20] 8 cmH20 Plateau Pressure:  [19 cmH20-36 cmH20] 20 cmH20   Intake/Output Summary (Last 24 hours) at 07/18/2021 4196 Last data filed at 07/17/2021 2100 Gross per 24 hour  Intake 791.2 ml  Output 2225 ml  Net -1433.8  ml   Filed Weights   07/15/21 0500 07/16/21 0500 07/18/21 0500  Weight: 86.4 kg 84.3 kg 81.4 kg    Examination: General:  laying in bed, intubated, on sedation HENT: NCAT PULM: Slight cracking Bilaterally, mechanically ventilated CV: RRR,  2/6 systolic murmur, no g/r GI: BS+ in all 4 quadrants, soft, non-tender, PEG tube in place MSK: normal bulk and tone, 2+ pitting edema present bilaterally in upper extremities, stocking in place to lower extremities bilaterally no edema Neuro: intubated, on sedation  P 4.2 K 4.3 Mg 2.2 CO2 26 Hgb stable 10 WBC 11.4-7.7 O-2225 net -1433 Glucose into 320s  Resolved Hospital Problem list   Lactic acidosis Hyponatremia> resolved AKI-resolved Acute COPD exacerbation-resolved  Assessment & Plan:  Acute hypoxemic and hypercarbic respiratory failure > extubated 2/6, Flash pulmonary edema reintubated 2/8  Chronic HFpEF Patient remains intubated this morning, no longer requiring pressors. Will continue with diuresis with Lasix today.  -Brovana/yupelri today -IV protonix 40 mg qd -IV Lasix 40 mg daily -DC PO Acetazolamide 250 mg q 6 hrs  Aspiration pneumonia -Concern for aspiration event, will broaden to Unasyn 3g, day 2/5  Paroxysmal atrial fibrillation Afib with RVR > improved Demand ischemia -Tele -PO Amiodarone at home dose -Metoprolol tartrate 25 mg BID with hold parameters -Eliquis 5 mg BID  HTN Off of pressors.  -Holding home medication of isosorbide mononitrate 30 mg BID  Hyperglycemia Glucose 300s, last dose of prednisone was yesterday. -SSI q 4 hrs -Add 10 units Semglee -Continue tube feeding coverage with 5 units novolog  Pulmonary  nodule 2.5 cm, spiculated, RLL> presumably infectious Will need outpatient follow up ; repeat imaging 4-6 weeks  Malnutrition to moderate degree Tube feedings  Cigarette smoker Counsel to quit  GERD PPI   Best Practice (right click and "Reselect all SmartList Selections" daily)    Diet/type: tubefeeds DVT prophylaxis: systemic heparin GI prophylaxis: PPI Lines: N/A Foley:  N/A Code Status:  full code Last date of multidisciplinary goals of care discussion [2/9]  Critical care time: 30 minutes    Lataja Newland M. Frazier Balfour, D.O.  Internal Medicine Resident, PGY-1 [facility]  Pager: [phone] 6:52 AM, 07/18/2021   **Please contact the on call pager after 5 pm and on weekends at [phone].**

## 2021-07-18 NOTE — Plan of Care (Addendum)
Pt extubated to 3L o2 via [place] with no complications. Family at bedside. Md called to bedside for update to family. No additional questions or concerns at this time. Will continue to monitor.

## 2021-07-18 NOTE — Progress Notes (Signed)
Telephone consent obtained from son for PICC.

## 2021-07-18 NOTE — Procedures (Signed)
Extubation Procedure Note  Patient Details:   Name: Ronald [name] DOB: [birth year] MRN: [id number]   Airway Documentation:    Vent end date: 07/18/21 Vent end time: 1030   Evaluation  O2 sats: stable throughout Complications: No apparent complications Patient did tolerate procedure well. Bilateral Breath Sounds: Clear, Diminished   Yes  Vetta Couzens 07/18/2021, 10:39 AM

## 2021-07-18 NOTE — TOC Progression Note (Signed)
Transition of Care [facility]) - Progression Note    Patient Details  Name: Ronald [name] MRN: [id number] Date of Birth: 03/24/1946  Transition of Care [facility]) CM/SW Contact  Joanne Chars, LCSW Phone Number: 07/18/2021, 11:37 AM  Clinical Narrative:   CSW updated daughter in law [name] regarding directing medicaid questions to [place] worker.  She is going to follow up.    CSW also LM with son Lizzie to talk further about DC plan.   Expected Discharge Plan: Skilled Nursing Facility Barriers to Discharge: Continued Medical Work up, Other (must enter comment), SNF Pending bed offer (financial contraints with paying for SNF-copay days)  Expected Discharge Plan and Services Expected Discharge Plan: [place] In-house Referral: Clinical Social Work   Post Acute Care Choice:  (TBD) Living arrangements for the past 2 months: Single Family Home                                       Social Determinants of Health (SDOH) Interventions    Readmission Risk Interventions Readmission Risk Prevention Plan 09/28/2020  Transportation Screening Complete  HRI or [place] Complete  Social Work Consult for [place] Planning/Counseling Complete  Palliative Care Screening Not Applicable  Medication Review [employer]) Complete  Some recent data might be hidden

## 2021-07-18 NOTE — Progress Notes (Signed)
Nutrition Follow-up  DOCUMENTATION CODES:   Not applicable  INTERVENTION:   Continue tube feeding via PEG: Jevity 1.5 at 60 ml/h (1440 ml per day) Prosource TF 45 ml BID  Provides 2240 kcal, 114 gm protein, 1094 ml free water daily.  Recommend SLP swallow evaluation prior to restarting PO diet.   NUTRITION DIAGNOSIS:   Inadequate oral intake related to inability to eat as evidenced by NPO status.  Ongoing  GOAL:   Patient will meet greater than or equal to 90% of their needs  Met with TF  MONITOR:   Vent status, Labs, TF tolerance  REASON FOR ASSESSMENT:   Ventilator, Consult Enteral/tube feeding initiation and management  ASSESSMENT:   76 yo male admitted with SOB, COPD exacerbation, tachycardia requiring cardioversion. PMH includes COPD Gold stage III/IV, tracheal stenosis, tracheostomy, PEG, CAD, HTN, HLD, HF, DM-2, chronic respiratory failure.  Discussed patient in ICU rounds and with RN today. Patient was extubated this morning.  Currently on 4 L oxygen via nasal cannula. PICC placed today.  PEG in place. Currently receiving Jevity 1.5 at 60 ml/h with Prosource TF 45 ml BID to provide 2240 kcal, 114 gm protein, 1094 ml free water daily. Tolerating TF without difficulty.    SLP was planning MBS prior to re-intubation. He was being allowed small bites of purees and nectar thick liquids.  Currently NPO.  Labs reviewed.  CBG: [phone]  Medications reviewed and include Lasix, Novolog, Semglee, Protonix, Mag sulfate.  Diet Order:   Diet Order             Diet NPO time specified  Diet effective now                   EDUCATION NEEDS:   No education needs have been identified at this time  Skin:  Skin Assessment: Reviewed RN Assessment  Last BM:  2/9 type 7, brown, large  Height:   Ht Readings from Last 1 Encounters:  07/10/21 _0  (1.753 m)    Weight:   Wt Readings from Last 1 Encounters:  07/18/21 81.4 kg    BMI:  Body  mass index is 26.5 kg/m.  Estimated Nutritional Needs:   Kcal:  2050-2250  Protein:  110-125 gm  Fluid:  >/= 2.1 L   Lucas Mallow RD, LDN, CNSC Please refer to Amion for contact information.

## 2021-07-18 NOTE — Progress Notes (Signed)
PT Cancellation Note  Patient Details Name: Ronald [name] MRN: [id number] DOB: 1946-02-10   Cancelled Treatment:    Reason Eval/Treat Not Completed: Patient at procedure or test/unavailable. Pt having PICC placed.   Shary Decamp [facility] 07/18/2021, 4:39 PM [place] Pager [phone] Office [phone]

## 2021-07-18 NOTE — Progress Notes (Signed)
Pt refusing BiPAP despite redirection, reorientation, precedex gtt, PO anxiety medication, and ativan. RN attempted to educate pt and apply BiPAP multiple times, but pt is confused and unwilling to cooperate. Will continue to monitor closely.

## 2021-07-18 NOTE — Progress Notes (Signed)
Pt becoming increasingly agitated and confused. Pt unaware of recent events and insists that he is not in hospital. Md notified and ordered to place pt on BiPAP. Pt refusing BiPAP at this time. Ativan ordered and RT notified. ABG done with results below. O2 sats WNL at this time on monitor. Ativan will be given with the hopes pt can allow BiPAP placement.    Latest Reference Range & Units 07/18/21 17:02  Sample type  ARTERIAL  pH, Arterial 7.350 - 7.450  7.431  pCO2 arterial 32.0 - 48.0 mmHg 46.4  pO2, Arterial 83.0 - 108.0 mmHg 59 (L)  TCO2 22 - 32 mmol/L 32  Acid-Base Excess 0.0 - 2.0 mmol/L 6.0 (H)  Bicarbonate 20.0 - 28.0 mmol/L 30.9 (H)  O2 Saturation % 91.0  Patient temperature  98.3 F  (L): Data is abnormally low (H): Data is abnormally high

## 2021-07-18 NOTE — Progress Notes (Signed)
OT Cancellation Note  Patient Details Name: Ronald [name] MRN: [id number] DOB: [birth year]   Cancelled Treatment:    Reason Eval/Treat Not Completed: Other (comment) Per RN, PICC line about to be placed and would like for pt to remain in current calm state for procedure. Will follow-up after PICC line placement as schedule permits.   Layla Maw 07/18/2021, 2:08 PM

## 2021-07-18 NOTE — Progress Notes (Signed)
[place] Progress Note Patient Name: Ronald [name] DOB: 11/09/1945 MRN: [id number]   Date of Service  07/18/2021  HPI/Events of Note  Patient is asking for something to help relieve nasal congestion. He was on Xanax at home for anxiety and his daughter was asking if he could be put back on it to help him tolerate BIPAP better, however he is already on Precedex, Seroquel, and Klonopin.  eICU Interventions  Mucinex + Saline nasal spray ordered for nasal congestion, iv Fentanyl orders on the MAR discontinued.        Kerry Kass Layth Cerezo 07/18/2021, 9:11 PM

## 2021-07-19 DIAGNOSIS — J9621 Acute and chronic respiratory failure with hypoxia: Secondary | ICD-10-CM | POA: Diagnosis not present

## 2021-07-19 DIAGNOSIS — J441 Chronic obstructive pulmonary disease with (acute) exacerbation: Secondary | ICD-10-CM | POA: Diagnosis not present

## 2021-07-19 DIAGNOSIS — J189 Pneumonia, unspecified organism: Secondary | ICD-10-CM | POA: Diagnosis not present

## 2021-07-19 DIAGNOSIS — J9622 Acute and chronic respiratory failure with hypercapnia: Secondary | ICD-10-CM | POA: Diagnosis not present

## 2021-07-19 LAB — BASIC METABOLIC PANEL
Anion gap: 5 (ref 5–15)
Anion gap: 7 (ref 5–15)
BUN: 35 mg/dL — ABNORMAL HIGH (ref 8–23)
BUN: 33 mg/dL — ABNORMAL HIGH (ref 8–23)
CO2: 28 mmol/L (ref 22–32)
CO2: 28 mmol/L (ref 22–32)
Calcium: 8.2 mg/dL — ABNORMAL LOW (ref 8.9–10.3)
Calcium: 8.3 mg/dL — ABNORMAL LOW (ref 8.9–10.3)
Chloride: 107 mmol/L (ref 98–111)
Chloride: 108 mmol/L (ref 98–111)
Creatinine, Ser: 0.99 mg/dL (ref 0.61–1.24)
Creatinine, Ser: 1 mg/dL (ref 0.61–1.24)
GFR, Estimated: >60 mL/min (ref >60)
GFR, Estimated: >60 mL/min (ref >60)
Glucose, Bld: 192 mg/dL — ABNORMAL HIGH (ref 70–99)
Glucose, Bld: 146 mg/dL — ABNORMAL HIGH (ref 70–99)
Potassium: 3.8 mmol/L (ref 3.5–5.1)
Potassium: 3.7 mmol/L (ref 3.5–5.1)
Sodium: 140 mmol/L (ref 135–145)
Sodium: 143 mmol/L (ref 135–145)

## 2021-07-19 LAB — POCT I-STAT 7, (LYTES, BLD GAS, ICA,H+H)
Acid-Base Excess: 1 mmol/L (ref 0.0–2.0)
Bicarbonate: 25.7 mmol/L (ref 20.0–28.0)
Calcium, Ion: 1.2 mmol/L (ref 1.15–1.40)
HCT: 30 % — ABNORMAL LOW (ref 39.0–52.0)
Hemoglobin: 10.2 g/dL — ABNORMAL LOW (ref 13.0–17.0)
O2 Saturation: 98 %
Patient temperature: 97.8
Potassium: 3.7 mmol/L (ref 3.5–5.1)
Sodium: 144 mmol/L (ref 135–145)
TCO2: 27 mmol/L (ref 22–32)
pCO2 arterial: 40.5 mmHg (ref 32.0–48.0)
pH, Arterial: 7.408 (ref 7.350–7.450)
pO2, Arterial: 104 mmHg (ref 83.0–108.0)

## 2021-07-19 LAB — GLUCOSE, CAPILLARY
Glucose-Capillary: 184 mg/dL — ABNORMAL HIGH (ref 70–99)
Glucose-Capillary: 139 mg/dL — ABNORMAL HIGH (ref 70–99)
Glucose-Capillary: 98 mg/dL (ref 70–99)
Glucose-Capillary: 145 mg/dL — ABNORMAL HIGH (ref 70–99)
Glucose-Capillary: 99 mg/dL (ref 70–99)
Glucose-Capillary: 125 mg/dL — ABNORMAL HIGH (ref 70–99)

## 2021-07-19 LAB — CBC
HCT: 30.5 % — ABNORMAL LOW (ref 39.0–52.0)
Hemoglobin: 9.7 g/dL — ABNORMAL LOW (ref 13.0–17.0)
MCH: 28.4 pg (ref 26.0–34.0)
MCHC: 31.8 g/dL (ref 30.0–36.0)
MCV: 89.4 fL (ref 80.0–100.0)
Platelets: 259 10*3/uL (ref 150–400)
RBC: 3.41 MIL/uL — ABNORMAL LOW (ref 4.22–5.81)
RDW: 15 % (ref 11.5–15.5)
WBC: 7.8 10*3/uL (ref 4.0–10.5)
nRBC: 0 % (ref 0.0–0.2)

## 2021-07-19 LAB — MAGNESIUM: Magnesium: 2 mg/dL (ref 1.7–2.4)

## 2021-07-19 MED ORDER — CLONAZEPAM 0.5 MG PO TBDP
0.5000 mg | ORAL_TABLET | Freq: Three times a day (TID) | ORAL | Status: DC
  Administered 2021-07-19 – 2021-07-20 (×5): 0.5 mg
  Filled 2021-07-19 (×6): qty 1

## 2021-07-19 MED ORDER — ISOSORBIDE MONONITRATE 20 MG PO TABS
10.0000 mg | ORAL_TABLET | Freq: Two times a day (BID) | ORAL | Status: DC
  Administered 2021-07-19 – 2021-07-25 (×13): 10 mg via ORAL
  Filled 2021-07-19 (×17): qty 1

## 2021-07-19 MED ORDER — HYDRALAZINE HCL 20 MG/ML IJ SOLN
25.0000 mg | Freq: Three times a day (TID) | INTRAMUSCULAR | Status: DC
  Administered 2021-07-19 (×2): 25 mg via INTRAVENOUS
  Filled 2021-07-19 (×2): qty 2

## 2021-07-19 MED ORDER — POTASSIUM CHLORIDE 20 MEQ PO PACK
20.0000 meq | PACK | Freq: Two times a day (BID) | ORAL | Status: DC
  Administered 2021-07-19: 20 meq via ORAL
  Filled 2021-07-19: qty 1

## 2021-07-19 MED ORDER — POTASSIUM CHLORIDE 20 MEQ PO PACK
20.0000 meq | PACK | Freq: Two times a day (BID) | ORAL | Status: AC
Start: 2021-07-19 — End: 2021-07-19
  Administered 2021-07-19: 20 meq
  Filled 2021-07-19: qty 1

## 2021-07-19 MED ORDER — QUETIAPINE FUMARATE 50 MG PO TABS
50.0000 mg | ORAL_TABLET | Freq: Every day | ORAL | Status: DC
  Administered 2021-07-19 – 2021-07-23 (×5): 50 mg
  Filled 2021-07-19 (×5): qty 1

## 2021-07-19 MED ORDER — BUDESONIDE 0.25 MG/2ML IN SUSP
0.2500 mg | Freq: Two times a day (BID) | RESPIRATORY_TRACT | Status: DC
  Administered 2021-07-19 – 2021-07-25 (×11): 0.25 mg via RESPIRATORY_TRACT
  Filled 2021-07-19 (×12): qty 2

## 2021-07-19 MED ORDER — FUROSEMIDE 10 MG/ML IJ SOLN
40.0000 mg | Freq: Once | INTRAMUSCULAR | Status: AC
  Administered 2021-07-19: 40 mg via INTRAVENOUS
  Filled 2021-07-19: qty 4

## 2021-07-19 MED ORDER — CLONAZEPAM 0.5 MG PO TBDP: 0.5000 mg | ORAL_TABLET | Freq: Three times a day (TID) | ORAL | Status: DC
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
## 2021-07-19 NOTE — TOC Progression Note (Signed)
Transition of Care [facility]) - Progression Note    Patient Details  Name: Ronald [name] MRN: [id number] Date of Birth: 1945-10-11  Transition [facility] CM/SW Contact  Joanne Chars, LCSW Phone Number: 07/19/2021, 1:39 PM  Clinical Narrative:   CSW spoke with pt son Ronald [name] regarding DC plan.  Ronald [name] reports his wife is talking to the people at [place] regarding the medicaid but does not have an answer.  CSW advised that MD estimate that pt could be ready for DC as soon as Monday, will need to have DC plan in place.  Discussed the two options of SNF with copays or finding a way to care for pt at home with [facility].  Ronald [name] states that pt does not have funds to pay copays and also that there is no one available to care for the pt at home.  When CSW asked what plan he would like to pursue, Ronald [name] responds that the medicaid people "are going to have to figure this out."      Expected Discharge Plan: [place] Barriers to Discharge: Continued Medical Work up, Other (must enter comment), SNF Pending bed offer (financial contraints with paying for SNF-copay days)  Expected Discharge Plan and Services Expected Discharge Plan: [place] In-house Referral: Clinical Social Work   Post Acute Care Choice:  (TBD) Living arrangements for the past 2 months: Single Family Home                                       Social Determinants of Health (SDOH) Interventions    Readmission Risk Interventions Readmission Risk Prevention Plan 09/28/2020  Transportation Screening Complete  HRI or Home Care Consult Complete  Social Work Consult for [place] Planning/Counseling Complete  Palliative Care Screening Not Applicable  Medication Review Press photographer) Complete  Some recent data might be hidden

## 2021-07-19 NOTE — Progress Notes (Signed)
Pt. Not tolerating the bipap. RN and RT have been working with pt. To try and get him to wear the bipap. Pt. Is tolerating on nasal cannula at this time. RT and RN will continue to talk with pt.
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
## 2021-07-19 NOTE — Progress Notes (Addendum)
NAME:  Ronald [name], MRN:  [id number], DOB:  [birth year], LOS: 9 ADMISSION DATE:  07/10/2021, CONSULTATION DATE:  2/1 REFERRING MD:  Shahmehdi, CHIEF COMPLAINT:  SOB  History of Present Illness:  76 y/o male with COPD and tracheal stenosis presented to the [facility] ER in the setting of agitation and dyspnea felt to be due to a COPD exacerbation vs aspiration pneumonia. He was extubated 2/6, but then became agitated with elevated blood pressure leading to pulmonary edema and hypoxic respiratory failure.  Pertinent  Medical History  COPD, severe Tracheal stenosis trach 2018 CAD status post DES x2 to LAD 2015 HTN HLD Chronic hypoxic respiratory failure on 3 L nasal cannula at baseline HFpEF Type 2 diabetes   Significant Hospital Events: Including procedures, antibiotic start and stop dates in addition to other pertinent events   2/1 admitted for acute on chronic hypoxic and hypercapnic respiratory failure, intubated on arrival, cefepime, metronidazole started 02/02 Metronidazole, cefepime dc, Unasyn and Doxycycline started 2/4 failed SBT due to atrial fibrillation  2/5 stopped unasyn 2/6 US showed moderate amount of right-sided pleural effusion, thoracentesis drained 300 cc, patient extubated 2/8 agitated, HTN into 200-130s, Lasix given, pulmonary edema reintubated 2/9 extubated  Interim History / Subjective:  Patient was extubated yesterday AM. PICC line placed. He became agitated and confused around 1800.  ABG ordered and did not show signs of CO2 retention. Patient given seroquel, precedex, and klonopin overnight to help with agitation.  Objective   Blood pressure (!) 73/40, pulse 65, temperature 98.1 F (36.7 C), temperature source Axillary, resp. rate (!) 22, height 5\' 9"  (1.753 m), weight 81.4 kg, SpO2 98 %.    Vent Mode: Stand-by FiO2 (%):  [40 %] 40 % Set Rate:  [26 bmp] 26 bmp Vt Set:  [560 mL] 560 mL PEEP:  [5 cmH20] 5 cmH20 Plateau Pressure:  [21 cmH20] 21 cmH20    Intake/Output Summary (Last 24 hours) at 07/19/2021 0541 Last data filed at 07/19/2021 0400 Gross per 24 hour  Intake 2166.69 ml  Output 1000 ml  Net 1166.69 ml    Filed Weights   07/15/21 0500 07/16/21 0500 07/18/21 0500  Weight: 86.4 kg 84.3 kg 81.4 kg    Examination: General:  laying in bed,oriented to self only HENT: NCAT PULM: Slight cracking Bilaterally, increased work of breathing, on 6 L [place] CV: RRR,  2/6 systolic murmur, no g/r GI: BS+ in all 4 quadrants, soft, non-tender, PEG tube in place MSK: normal bulk and tone, 2+ pitting edema present bilaterally in upper extremities, stocking in place to lower extremities bilaterally no edema Neuro: alert, oriented to self only  Creatinine 0.99, BUN 35, GFR >60 K 3.8 Hgb stable 10.2-9.7 ABG 7.4/ 46.4/ 59/ 30.9 CXR showed persistent but improved bilateral infiltrates. I 2092 O [facility] Problem list   Lactic acidosis Hyponatremia> resolved AKI-resolved Acute COPD exacerbation-resolved  Assessment & Plan:  Acute hypoxemic and hypercarbic respiratory failure > extubated 2/6, Flash pulmonary edema reintubated 2/8 , extubated 2/9 Chronic HFpEF Patient extubated yesterday. He has increased work of breathing on [place] but refusing Bipap. Will continue with diuresis. -Brovana/yupelri today -IV Lasix 40 mg daily  Aspiration pneumonia -Concern for aspiration event, will broaden to Unasyn 3g, day 4/5  Paroxysmal atrial fibrillation Afib with RVR > improved [place] ischemia -Tele -PO Amiodarone at home dose -Metoprolol tartrate 25 mg BID with hold parameters -Eliquis 5 mg BID  HTN Blood pressure into 170/60s. -Restart home medication of isosorbide mononitrate  30 mg BID  Hyperglycemia Glucose 80-190s.  -SSI q 4 hrs -Add 10 units Semglee -Continue tube feeding coverage with 5 units novolog  Pulmonary nodule 2.5 cm, spiculated, RLL> presumably infectious Will need outpatient follow up ; repeat imaging  4-6 weeks  Malnutrition to moderate degree Tube feedings  Cigarette smoker Counsel to quit  GERD PPI   Best Practice (right click and "Reselect all SmartList Selections" daily)   Diet/type: tubefeeds DVT prophylaxis: Eliquis GI prophylaxis: PPI Lines: N/A Foley:  N/A Code Status:  full code Last date of multidisciplinary goals of care discussion [2/10]  Critical care time: 30 minutes    Tryphena Perkovich M. Imya Mance, D.O.  Internal Medicine Resident, PGY-1 [facility]  Pager: [phone] 5:41 AM, 07/19/2021   **Please contact the on call pager after 5 pm and on weekends at [phone].**

## 2021-07-19 NOTE — Evaluation (Signed)
Clinical/Bedside Swallow Evaluation Patient Details  Name: DEMERE DOTZLER MRN: [id number] Date of Birth: 10/21/1945  Today's Date: 07/19/2021 Time: SLP Start Time (ACUTE ONLY): 2841 SLP Stop Time (ACUTE ONLY): 0905 SLP Time Calculation (min) (ACUTE ONLY): 10 min  Past Medical History:  Past Medical History:  Diagnosis Date   Anxiety    Asthma    Atrial flutter ([place]) 04/2021   Chronic lower back pain    COPD (chronic obstructive pulmonary disease) ([place])    Coronary artery disease    a. NSTEMI 05/2014 s/p DESx2 to LAD at [facility].   Depression    Educated about COVID-19 virus infection 03/06/2020   GERD (gastroesophageal reflux disease)    High cholesterol    Hypertension    NSTEMI (non-ST elevated myocardial infarction) ([place]) 05/2014   with stent placement   Sleep apnea    Stroke ([place]) 2017   anyeusym    TIA (transient ischemic attack)    "they say I've had some mini strokes; don't know when"; denies residual on 06/22/2014)   Type II diabetes mellitus ([place])    Ulcerative colitis ([place])    Past Surgical History:  Past Surgical History:  Procedure Laterality Date   APPENDECTOMY     BIOPSY  07/20/2020   Procedure: BIOPSY;  Surgeon: Harvel Quale, MD;  [facility];  Service: Gastroenterology;;   CARDIAC CATHETERIZATION  [id number] X 3   CHOLECYSTECTOMY     COLONOSCOPY WITH PROPOFOL N/A 07/20/2020   Procedure: COLONOSCOPY WITH PROPOFOL;  Surgeon: Harvel Quale, MD;  [facility];  Service: Gastroenterology;  Laterality: N/A;  1:15   CORONARY ANGIOPLASTY WITH STENT PLACEMENT  05/2014   "2"   ESOPHAGEAL DILATION N/A 07/20/2020   Procedure: ESOPHAGEAL DILATION;  Surgeon: Harvel Quale, MD;  [facility];  Service: Gastroenterology;  Laterality: N/A;   ESOPHAGOGASTRODUODENOSCOPY (EGD) WITH PROPOFOL N/A 07/20/2020   Procedure: ESOPHAGOGASTRODUODENOSCOPY (EGD) WITH PROPOFOL;  Surgeon: Harvel Quale, MD;  [facility]  [facility];  Service: Gastroenterology;  Laterality: N/A;   IR GASTROSTOMY TUBE MOD SED  06/04/2021   LEFT HEART CATH AND CORONARY ANGIOGRAPHY N/A 05/25/2020   Procedure: LEFT HEART CATH AND CORONARY ANGIOGRAPHY;  Surgeon: Martinique, Peter M, MD;  Location: [place] [facility];  Service: Cardiovascular;  Laterality: N/A;   POLYPECTOMY  07/20/2020   Procedure: POLYPECTOMY INTESTINAL;  Surgeon: Montez Morita, Quillian Quince, MD;  [facility]: [facility];  Service: Gastroenterology;;   TUMOR EXCISION Right ~ 1999   "side of my upper head"   HPI:  Pt is a 76 yo male presenting with agitation and dyspnea felt to be due to COPD exacerbation vs aspiration PNA.  CT Chest showed new extensive alveolar infiltrates bilaterally but more so in the RLL. ETT 2/1-2/6. Evalauted by SLP on 2/7, MBS recommended but pt reintubated on 2/8-2/9 due to pulmonary edema.  MBS in September 2021 revealed a mild oral dysphagia, pharyngeal phase WNL; regular solids and thin liquids recommended. FEES in December 2022 after intubation recommended Dys 2 solids due to mild oropharyngeal dysphagia with residue and a single instance of sensed aspiration with effective cough. PEG 12/27 due to poor PO intake. PMH also includes: tracheal stenosis s/p trach (since decannulated), COPD, GERD, stroke, CAD, HTN, HLD, DMII    Assessment / Plan / Recommendation  Clinical Impression  Pt demonstrates immediate cough with thin liquids, high RR and ongoing concern for a respiratory based dysphagia as well as ongoing impact from intubation x2. He  tolerates puree and ice very well. Given that pt has a PEG tube in place (since late december due to poor oral intake, not oropharyngeal dysphagia) pt can rely on PEG tube at this time for meds and nutrition. Can be given puree and ice for comfort, but otherwise to remain NPO until reassessment Monday. Pt may need MBS; hopeful for more stable respiratory function at that time to tolerate exam. SLP Visit Diagnosis:  Dysphagia, oropharyngeal phase (R13.12)    Aspiration Risk  Moderate aspiration risk    Diet Recommendation Ice chips PRN after oral care;Alternative means - long-term   Liquid Administration via: Cup;Straw Medication Administration: Via alternative means Supervision: Full supervision/cueing for compensatory strategies    Other  Recommendations Oral Care Recommendations: Oral care BID    Recommendations for follow up therapy are one component of a multi-disciplinary discharge planning process, led by the attending physician.  Recommendations may be updated based on patient status, additional functional criteria and insurance authorization.  Follow up Recommendations Skilled nursing-short term rehab (<3 hours/day)      Assistance Recommended at Discharge    Functional Status Assessment    Frequency and Duration min 2x/week  2 weeks       Prognosis        Swallow Study   General HPI: Pt is a 76 yo male presenting with agitation and dyspnea felt to be due to COPD exacerbation vs aspiration PNA.  CT Chest showed new extensive alveolar infiltrates bilaterally but more so in the RLL. ETT 2/1-2/6. Evalauted by SLP on 2/7, MBS recommended but pt reintubated on 2/8-2/9 due to pulmonary edema.  MBS in September 2021 revealed a mild oral dysphagia, pharyngeal phase WNL; regular solids and thin liquids recommended. FEES in December 2022 after intubation recommended Dys 2 solids due to mild oropharyngeal dysphagia with residue and a single instance of sensed aspiration with effective cough. PEG 12/27 due to poor PO intake. PMH also includes: tracheal stenosis s/p trach (since decannulated), COPD, GERD, stroke, CAD, HTN, HLD, DMII Type of Study: Bedside Swallow Evaluation Previous Swallow Assessment: see HPI Diet Prior to this Study: NPO;PEG tube Temperature Spikes Noted: No Respiratory Status: Nasal cannula History of Recent Intubation: Yes Length of Intubations (days): 7 days Date  extubated: 07/17/21 Behavior/Cognition: Alert;Cooperative;Pleasant mood Oral Cavity Assessment: Within Functional Limits Oral Care Completed by SLP: No Oral Cavity - Dentition: Edentulous Vision: Functional for self-feeding Self-Feeding Abilities: Needs assist Patient Positioning: Upright in bed Baseline Vocal Quality: Breathy;Hoarse Volitional Cough: Weak;Congested Volitional Swallow: Able to elicit    Oral/Motor/Sensory Function Overall Oral Motor/Sensory Function: Within functional limits   Ice Chips Ice chips: Within functional limits   Thin Liquid Thin Liquid: Impaired Presentation: Straw;Spoon    Nectar Thick Nectar Thick Liquid: Not tested   Honey Thick Honey Thick Liquid: Not tested   Puree Puree: Within functional limits   Solid     Solid: Not tested      Lynann Beaver 07/19/2021,9:23 AM

## 2021-07-20 DIAGNOSIS — I2510 Atherosclerotic heart disease of native coronary artery without angina pectoris: Secondary | ICD-10-CM

## 2021-07-20 DIAGNOSIS — R911 Solitary pulmonary nodule: Secondary | ICD-10-CM

## 2021-07-20 DIAGNOSIS — J441 Chronic obstructive pulmonary disease with (acute) exacerbation: Secondary | ICD-10-CM | POA: Diagnosis not present

## 2021-07-20 DIAGNOSIS — J9621 Acute and chronic respiratory failure with hypoxia: Secondary | ICD-10-CM | POA: Diagnosis not present

## 2021-07-20 DIAGNOSIS — G9340 Encephalopathy, unspecified: Secondary | ICD-10-CM | POA: Diagnosis not present

## 2021-07-20 DIAGNOSIS — I4892 Unspecified atrial flutter: Secondary | ICD-10-CM | POA: Diagnosis not present

## 2021-07-20 LAB — CBC
HCT: 30.3 % — ABNORMAL LOW (ref 39.0–52.0)
Hemoglobin: 9.6 g/dL — ABNORMAL LOW (ref 13.0–17.0)
MCH: 28.1 pg (ref 26.0–34.0)
MCHC: 31.7 g/dL (ref 30.0–36.0)
MCV: 88.6 fL (ref 80.0–100.0)
Platelets: 295 10*3/uL (ref 150–400)
RBC: 3.42 MIL/uL — ABNORMAL LOW (ref 4.22–5.81)
RDW: 15.2 % (ref 11.5–15.5)
WBC: 8.8 10*3/uL (ref 4.0–10.5)
nRBC: 0 % (ref 0.0–0.2)

## 2021-07-20 LAB — BASIC METABOLIC PANEL
Anion gap: 8 (ref 5–15)
Anion gap: 9 (ref 5–15)
BUN: 31 mg/dL — ABNORMAL HIGH (ref 8–23)
BUN: 27 mg/dL — ABNORMAL HIGH (ref 8–23)
CO2: 28 mmol/L (ref 22–32)
CO2: 29 mmol/L (ref 22–32)
Calcium: 8.3 mg/dL — ABNORMAL LOW (ref 8.9–10.3)
Calcium: 8.6 mg/dL — ABNORMAL LOW (ref 8.9–10.3)
Chloride: 109 mmol/L (ref 98–111)
Chloride: 106 mmol/L (ref 98–111)
Creatinine, Ser: 0.95 mg/dL (ref 0.61–1.24)
Creatinine, Ser: 1.03 mg/dL (ref 0.61–1.24)
GFR, Estimated: >60 mL/min (ref >60)
GFR, Estimated: >60 mL/min (ref >60)
Glucose, Bld: 143 mg/dL — ABNORMAL HIGH (ref 70–99)
Glucose, Bld: 194 mg/dL — ABNORMAL HIGH (ref 70–99)
Potassium: 3.7 mmol/L (ref 3.5–5.1)
Potassium: 4.1 mmol/L (ref 3.5–5.1)
Sodium: 145 mmol/L (ref 135–145)
Sodium: 144 mmol/L (ref 135–145)

## 2021-07-20 LAB — GLUCOSE, CAPILLARY
Glucose-Capillary: 110 mg/dL — ABNORMAL HIGH (ref 70–99)
Glucose-Capillary: 140 mg/dL — ABNORMAL HIGH (ref 70–99)
Glucose-Capillary: 160 mg/dL — ABNORMAL HIGH (ref 70–99)
Glucose-Capillary: 134 mg/dL — ABNORMAL HIGH (ref 70–99)
Glucose-Capillary: 151 mg/dL — ABNORMAL HIGH (ref 70–99)
Glucose-Capillary: 117 mg/dL — ABNORMAL HIGH (ref 70–99)

## 2021-07-20 LAB — MAGNESIUM: Magnesium: 1.9 mg/dL (ref 1.7–2.4)

## 2021-07-20 MED ORDER — FUROSEMIDE 10 MG/ML IJ SOLN
40.0000 mg | Freq: Once | INTRAMUSCULAR | Status: AC
  Administered 2021-07-20: 40 mg via INTRAVENOUS
  Filled 2021-07-20: qty 4

## 2021-07-20 MED ORDER — POTASSIUM CHLORIDE 20 MEQ PO PACK
20.0000 meq | PACK | Freq: Once | ORAL | Status: AC
  Administered 2021-07-20: 20 meq
  Filled 2021-07-20: qty 1

## 2021-07-20 MED ORDER — SODIUM BICARBONATE 650 MG PO TABS
650.0000 mg | ORAL_TABLET | Freq: Once | ORAL | Status: AC
  Administered 2021-07-20: 650 mg
  Filled 2021-07-20: qty 1

## 2021-07-20 MED ORDER — POTASSIUM CHLORIDE 20 MEQ PO PACK
40.0000 meq | PACK | Freq: Once | ORAL | Status: AC
  Administered 2021-07-20: 40 meq
  Filled 2021-07-20: qty 2

## 2021-07-20 MED ORDER — MAGNESIUM SULFATE 2 GM/50ML IV SOLN
2.0000 g | Freq: Once | INTRAVENOUS | Status: AC
  Administered 2021-07-20: 2 g via INTRAVENOUS
  Filled 2021-07-20: qty 50

## 2021-07-20 MED ORDER — HYDRALAZINE HCL 25 MG PO TABS
25.0000 mg | ORAL_TABLET | Freq: Three times a day (TID) | ORAL | Status: DC
  Administered 2021-07-20 – 2021-07-22 (×6): 25 mg via ORAL
  Filled 2021-07-20 (×6): qty 1

## 2021-07-20 MED ORDER — PANCRELIPASE (LIP-PROT-AMYL) 10440-39150 UNITS PO TABS
20880.0000 [IU] | ORAL_TABLET | Freq: Once | ORAL | Status: AC
  Administered 2021-07-20: 20880 [IU]
  Filled 2021-07-20: qty 2

## 2021-07-20 NOTE — Progress Notes (Signed)
[place] Progress Note Patient Name: Ronald [name] DOB: 1945-12-24 MRN: [id number]   Date of Service  07/20/2021  HPI/Events of Note  Request for telesitter for safety  eICU Interventions  Orders placed        Carr Shartzer Rodman Pickle 07/20/2021, 8:10 PM

## 2021-07-20 NOTE — Progress Notes (Signed)
[place] Progress Note Patient Name: CISCO KINDT DOB: 30-May-1946 MRN: [id number]   Date of Service  07/20/2021  HPI/Events of Note  IV hydralazine given and SBP dropped to 80s.   Scheduled hydralazine was intended to be PO/per tube however ordered as IV  eICU Interventions  Changed scheduled hydralazine to per tube     Intervention Category Intermediate Interventions: Hypertension - evaluation and management  Nurah Petrides Rodman Pickle 07/20/2021, 5:21 AM
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
## 2021-07-20 NOTE — Progress Notes (Signed)
RT note. Patient currently on 5L [place] sat 98%, no labored breathing noted. Servo is in patients room if bipap is needed later. RT will continue to monitor.

## 2021-07-20 NOTE — Progress Notes (Signed)
[facility] ADULT ICU REPLACEMENT PROTOCOL   The patient does apply for the [facility] Adult ICU Electrolyte Replacment Protocol based on the criteria listed below:   1.Exclusion criteria: TCTS patients, ECMO patients, and Dialysis patients 2. Is GFR >/= 30 ml/min? Yes.    Patient's GFR today is >60 3. Is SCr </= 2? Yes.   Patient's SCr is 0.95 mg/dL 4. Did SCr increase >/= 0.5 in 24 hours? No. 5.Pt's weight >40kg  Yes.   6. Abnormal electrolyte(s): K+ 3.7, mag 1.9  7. Electrolytes replaced per protocol 8.  Call MD STAT for K+ </= 2.5, Phos </= 1, or Mag </= 1 Physician:  n/a  Darlys Gales 07/20/2021 5:48 AM

## 2021-07-21 DIAGNOSIS — J9621 Acute and chronic respiratory failure with hypoxia: Secondary | ICD-10-CM | POA: Diagnosis not present

## 2021-07-21 DIAGNOSIS — I4892 Unspecified atrial flutter: Secondary | ICD-10-CM | POA: Diagnosis not present

## 2021-07-21 DIAGNOSIS — J441 Chronic obstructive pulmonary disease with (acute) exacerbation: Secondary | ICD-10-CM | POA: Diagnosis not present

## 2021-07-21 DIAGNOSIS — J9601 Acute respiratory failure with hypoxia: Secondary | ICD-10-CM | POA: Diagnosis not present

## 2021-07-21 LAB — CBC
HCT: 29.7 % — ABNORMAL LOW (ref 39.0–52.0)
Hemoglobin: 9 g/dL — ABNORMAL LOW (ref 13.0–17.0)
MCH: 27.3 pg (ref 26.0–34.0)
MCHC: 30.3 g/dL (ref 30.0–36.0)
MCV: 90 fL (ref 80.0–100.0)
Platelets: 297 10*3/uL (ref 150–400)
RBC: 3.3 MIL/uL — ABNORMAL LOW (ref 4.22–5.81)
RDW: 15.3 % (ref 11.5–15.5)
WBC: 9.7 10*3/uL (ref 4.0–10.5)
nRBC: 0 % (ref 0.0–0.2)

## 2021-07-21 LAB — BASIC METABOLIC PANEL
Anion gap: 7 (ref 5–15)
BUN: 30 mg/dL — ABNORMAL HIGH (ref 8–23)
CO2: 30 mmol/L (ref 22–32)
Calcium: 8.4 mg/dL — ABNORMAL LOW (ref 8.9–10.3)
Chloride: 108 mmol/L (ref 98–111)
Creatinine, Ser: 1.07 mg/dL (ref 0.61–1.24)
GFR, Estimated: >60 mL/min (ref >60)
Glucose, Bld: 152 mg/dL — ABNORMAL HIGH (ref 70–99)
Potassium: 4.1 mmol/L (ref 3.5–5.1)
Sodium: 145 mmol/L (ref 135–145)

## 2021-07-21 LAB — GLUCOSE, CAPILLARY
Glucose-Capillary: 129 mg/dL — ABNORMAL HIGH (ref 70–99)
Glucose-Capillary: 119 mg/dL — ABNORMAL HIGH (ref 70–99)
Glucose-Capillary: 131 mg/dL — ABNORMAL HIGH (ref 70–99)
Glucose-Capillary: 163 mg/dL — ABNORMAL HIGH (ref 70–99)
Glucose-Capillary: 160 mg/dL — ABNORMAL HIGH (ref 70–99)

## 2021-07-21 MED ORDER — FUROSEMIDE 10 MG/ML IJ SOLN
40.0000 mg | Freq: Two times a day (BID) | INTRAMUSCULAR | Status: DC
  Administered 2021-07-21 – 2021-07-24 (×6): 40 mg via INTRAVENOUS
  Filled 2021-07-21 (×6): qty 4

## 2021-07-21 MED ORDER — CLONAZEPAM 0.25 MG PO TBDP
0.2500 mg | ORAL_TABLET | Freq: Two times a day (BID) | ORAL | Status: DC
  Administered 2021-07-21 – 2021-07-24 (×7): 0.25 mg
  Filled 2021-07-21 (×7): qty 1

## 2021-07-21 NOTE — Progress Notes (Signed)
Pt exhibited violent episode, threatening staff verbally and physically while attempting to stand without assistance. Pt was unable to be redirected or reoriented during this time. This RN was able to deescalate the situation by waiting for pt to calm down. No other interventions needed and pt was agreeable to get back into bed. Pt still disoriented x4, but calm and resting in bed at this time, will continue to monitor closely.
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
## 2021-07-22 ENCOUNTER — Inpatient Hospital Stay (HOSPITAL_COMMUNITY): Payer: Medicare HMO

## 2021-07-22 DIAGNOSIS — J9602 Acute respiratory failure with hypercapnia: Secondary | ICD-10-CM | POA: Diagnosis not present

## 2021-07-22 DIAGNOSIS — J9601 Acute respiratory failure with hypoxia: Secondary | ICD-10-CM | POA: Diagnosis not present

## 2021-07-22 LAB — BASIC METABOLIC PANEL
Anion gap: 6 (ref 5–15)
BUN: 29 mg/dL — ABNORMAL HIGH (ref 8–23)
CO2: 32 mmol/L (ref 22–32)
Calcium: 8.1 mg/dL — ABNORMAL LOW (ref 8.9–10.3)
Chloride: 105 mmol/L (ref 98–111)
Creatinine, Ser: 1.09 mg/dL (ref 0.61–1.24)
GFR, Estimated: >60 mL/min (ref >60)
Glucose, Bld: 120 mg/dL — ABNORMAL HIGH (ref 70–99)
Potassium: 3.7 mmol/L (ref 3.5–5.1)
Sodium: 143 mmol/L (ref 135–145)

## 2021-07-22 LAB — GLUCOSE, CAPILLARY
Glucose-Capillary: 102 mg/dL — ABNORMAL HIGH (ref 70–99)
Glucose-Capillary: 103 mg/dL — ABNORMAL HIGH (ref 70–99)
Glucose-Capillary: 127 mg/dL — ABNORMAL HIGH (ref 70–99)
Glucose-Capillary: 136 mg/dL — ABNORMAL HIGH (ref 70–99)
Glucose-Capillary: 141 mg/dL — ABNORMAL HIGH (ref 70–99)
Glucose-Capillary: 143 mg/dL — ABNORMAL HIGH (ref 70–99)
Glucose-Capillary: 172 mg/dL — ABNORMAL HIGH (ref 70–99)
Glucose-Capillary: 50 mg/dL — ABNORMAL LOW (ref 70–99)

## 2021-07-22 LAB — MAGNESIUM: Magnesium: 2 mg/dL (ref 1.7–2.4)
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
## 2021-07-22 NOTE — Progress Notes (Signed)
NAME:  Ronald [name], MRN:  [id number], DOB:  1945-07-05, LOS: 12 ADMISSION DATE:  07/10/2021, CONSULTATION DATE:  2/1 REFERRING MD:  Shahmehdi, CHIEF COMPLAINT:  SOB  History of Present Illness:  76 y/o male with COPD and tracheal stenosis presented to the [facility] ER in the setting of agitation and dyspnea felt to be due to a COPD exacerbation vs aspiration pneumonia. He was extubated 2/6, but then became agitated with elevated blood pressure leading to pulmonary edema and hypoxic respiratory failure.  Pertinent  Medical History  COPD, severe Tracheal stenosis trach 2018 CAD status post DES x2 to LAD 2015 HTN HLD Chronic hypoxic respiratory failure on 3 L nasal cannula at baseline HFpEF Type 2 diabetes   Significant Hospital Events: Including procedures, antibiotic start and stop dates in addition to other pertinent events   2/1 admitted for acute on chronic hypoxic and hypercapnic respiratory failure, intubated on arrival, cefepime, metronidazole started 02/02 Metronidazole, cefepime dc, Unasyn and Doxycycline started 2/4 failed SBT due to atrial fibrillation  2/5 stopped unasyn 2/6 US showed moderate amount of right-sided pleural effusion, thoracentesis drained 300 cc, patient extubated 2/8 agitated, HTN into 200-130s, Lasix given, pulmonary edema reintubated 2/9 extubated 2/10 Patient with increased work of breathing and confusion. ABG wnl. used NIMV intermittently  Interim History / Subjective:   Feels better today Still has some chest congestion Hasn't been out of bed today  Objective   Blood pressure (!) 100/43, pulse 95, temperature 98.5 F (36.9 C), temperature source Oral, resp. rate 17, height 5\' 9"  (1.753 m), weight 77.7 kg, SpO2 95 %.        Intake/Output Summary (Last 24 hours) at 07/22/2021 0955 Last data filed at 07/22/2021 0438 Gross per 24 hour  Intake 898.04 ml  Output 3400 ml  Net -2501.96 ml   Filed Weights   07/19/21 0500 07/20/21 0500 07/21/21 0500   Weight: 80.1 kg 77.3 kg 77.7 kg   Examination:  General:  Resting comfortably in bed HENT: NCAT OP clear PULM: Slight upper airway wheze, normal effort CV: RRR, no mgr GI: BS+, soft, nontender, PEG tube MSK: normal bulk and tone Derm: chronic upper extremity edema Neuro: awake, alert, no distress, MAEW   Na 145 K 4.1 Hgb 9.0   Resolved Hospital Problem list   Lactic acidosis Hyponatremia> resolved AKI-resolved Acute COPD exacerbation-resolved Acute hypoxemic and hypercarbic respiratory failure  Aspiration pna Afib with RVR   Assessment & Plan:  Chronic respiratory failure with hypercarbia and hypoxemia due to advanced COPD Chronic HFpEF History of severe COPD and tracheal stenosis Anxiety Paroxysmal atrial fibrillation Demand ischemia HTN Hyperglycemia Pulmonary nodule 2.5 cm, spiculated, RLL> presumably infectious Cigarette smoker  Discussion: Feeling better after a difficult hospitalization.  Overall prognosis poor due to advanced COPD as detailed in multiple prior notes.  He refuses BIPAP (which could help prevent future hospitalizations due to COPD) due to inability to wear a mask.    Plan Monitor off steroids Continue amiodarone Continue pulmicort, brovana, yupelri  Wean off O2 to maintain O2 saturation > 88% Ambulate Out of bed  I had a lengthy conversation about goals of care with him today.  I told him that I doubt he will survive more than 1-2 years given his recent trajectory, chronic COPD, deconditioned state.  He doesn't want BIPAP even though I explained it might keep him out of the hospital.  He stated unequivocally that he WOULD want to be re-intubated in the event of another respiratory arrest.  I  told him at some point he's going to get to a point where he needs hospice and palliative care.  Based on what he told me today I'm not sure we need palliative medicine to see him this hospitalization.    Best Practice (right click and "Reselect all  SmartList Selections" daily)   Diet/type: tubefeeds DVT prophylaxis: Eliquis GI prophylaxis: PPI Lines: N/A Foley:  N/A Code Status:  full code Last date of multidisciplinary goals of care discussion [2/13 with [place]]  PCCM available PRN  Roselie Awkward, MD [facility] Pager: [phone] Cell: [phone] After 7:00 pm call Elink  [phone]

## 2021-07-22 NOTE — Progress Notes (Signed)
Modified Barium Swallow Progress Note  Patient Details  Name: Ronald [name] MRN: [id number] Date of Birth: 1945-07-27  Today's Date: 07/22/2021  Modified Barium Swallow completed.  Full report located under Chart Review in the Imaging Section.  Brief recommendations include the following:  Clinical Impression  Pt today presents with much improved respiratory function in compairson with bedside. Respiratory rate WNL, pt able to coordinate breathing and swallowing throughout exam without effort. Pt with a mild oral dysphagia secondary to missing dentition; pt with prolonged manipulation of cookie without crushing mechanism of gums. Partial cookie removed. Pt with piecemeal oral transit of most bolsues with transit to vallculae prior to the swallow with all partial boluses. There was one instance of sensed aspiration before the swallow with an early sip, pt immediately ejected trace aspriation with a hard cough and otherwise demonstrated good strength and airway protection. Risk of aspiration will increase with episodes of respiratory distress. Pt should be aware of increased risk. Will resume a dys 2 diet and thin liquids with f/u at bedside to check for tolerance and educate pt.   Swallow Evaluation Recommendations       SLP Diet Recommendations: Thin liquid;Dysphagia 2 (Fine chop) solids   Liquid Administration via: Cup;Straw   Medication Administration: Whole meds with liquid   Supervision: Patient able to self feed   Compensations: Slow rate;Small sips/bites   Postural Changes: Seated upright at 90 degrees   Oral Care Recommendations: Oral care BID        Davian Hanshaw, Katherene Ponto 07/22/2021,12:42 PM

## 2021-07-22 NOTE — Consult Note (Signed)
° °  [facility] CM Inpatient Consult   07/22/2021  [name] April 03, 1946 [id number]  [facility] [ACO] Patient: Ronald [name] Medicare  Primary Care Provider:  [name], FNP, [place] is an embedded provider with a Chronic Care Management team and program, and is listed for the transition of care follow up and appointments.  Patient was screened for high risk for Embedded practice service needs for chronic care management and patient has been active in the CCM program with Embedded RNCM. Review reveals patient is for home verses SNF [patient would have co-pays] per inpatient [facility] team notes for barriers to transition.  Plan: Notification sent to the [place] Management and make aware of TOC for post hospital needs.  Please contact for further questions,  [name], RN BSN [facility] Liaison  [phone] business mobile phone Toll free office [phone]  Fax number: [phone] [email or web address]

## 2021-07-22 NOTE — Care Management Important Message (Signed)
Important Message  Patient Details  Name: Ronald [name] MRN: [id number] Date of Birth: 02-21-1946   Medicare Important Message Given:  Yes     Hannah Beat 07/22/2021, 12:32 PM

## 2021-07-22 NOTE — Progress Notes (Signed)
Hypoglycemic Event  CBG: 50  Treatment: 8 oz juice/soda  Symptoms: None  Follow-up CBG: Time:2219 CBG Result:127  Possible Reasons for Event: Inadequate meal intake  Comments/MD notified:James Olena Heckle, NP notified    Ronald [name]

## 2021-07-23 DIAGNOSIS — E782 Mixed hyperlipidemia: Secondary | ICD-10-CM

## 2021-07-23 DIAGNOSIS — I480 Paroxysmal atrial fibrillation: Secondary | ICD-10-CM

## 2021-07-23 LAB — GLUCOSE, CAPILLARY
Glucose-Capillary: 124 mg/dL — ABNORMAL HIGH (ref 70–99)
Glucose-Capillary: 131 mg/dL — ABNORMAL HIGH (ref 70–99)
Glucose-Capillary: 148 mg/dL — ABNORMAL HIGH (ref 70–99)
Glucose-Capillary: 149 mg/dL — ABNORMAL HIGH (ref 70–99)
Glucose-Capillary: 152 mg/dL — ABNORMAL HIGH (ref 70–99)
Glucose-Capillary: 170 mg/dL — ABNORMAL HIGH (ref 70–99)
Glucose-Capillary: 77 mg/dL (ref 70–99)

## 2021-07-23 LAB — PREALBUMIN: Prealbumin: 19 mg/dL (ref 18–38)

## 2021-07-23 LAB — PROCALCITONIN: Procalcitonin: <0.1 ng/mL

## 2021-07-23 MED ORDER — INSULIN ASPART 100 UNIT/ML IJ SOLN
0.0000 [IU] | Freq: Every day | INTRAMUSCULAR | Status: DC
  Administered 2021-07-24: 2 [IU] via SUBCUTANEOUS

## 2021-07-23 MED ORDER — INSULIN ASPART 100 UNIT/ML IJ SOLN
0.0000 [IU] | Freq: Three times a day (TID) | INTRAMUSCULAR | Status: DC
  Administered 2021-07-23: 2 [IU] via SUBCUTANEOUS
  Administered 2021-07-24 (×2): 1 [IU] via SUBCUTANEOUS
  Administered 2021-07-25: 2 [IU] via SUBCUTANEOUS
  Administered 2021-07-25: 1 [IU] via SUBCUTANEOUS

## 2021-07-23 NOTE — Progress Notes (Addendum)
Patient's family and POA have requested PEG tube feeding to be stopped and PEG tube to be removed.  IR consulted.  PICC line will be discontinued upon discharge.

## 2021-07-23 NOTE — Progress Notes (Signed)
Patient ID: Ronald [name], male   DOB: 15-Feb-1946, 76 y.o.   MRN: [id number]  [place], [place],, [facility], has requested to stop the patient's tube feeding. MD notified.  Haydee Salter, RN

## 2021-07-23 NOTE — Consult Note (Addendum)
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 if he remains in sinus - continue BB, eliquis   Hypertension Continue present medications   Chronic diastolic heart failure Diuresing with 40 mg IV lasix BID - OK to transition to his home dose of 40 mg lasix daily when taking PO or per tube   Will see back in clinic in the next 2-4 weeks.    Risk Assessment/Risk Scores:    CHA2DS2-VASc Score = 7   This indicates a 11.2% annual risk of stroke. The patient's score is based upon: CHF History: 1 HTN History: 1 Diabetes History: 1 Stroke History: 2 Vascular Disease History: 1 Age Score: 1 Gender Score: 0       For questions or updates, please contact [facility] Please consult [email or web address] for contact info under    Signed, Ledora Bottcher, [place]  07/23/2021  3:24 PM   Patient seen and examined. Agree with assessment and plan.  Agree with comprehensive evaluation as noted above.  Ronald [name] has known CAD status post DES stenting x2 to his LAD in 2015 at which time he had concomitant CAD for which medical therapy was recommended.  He has a history of COPD, and recently developed acute hypoxic and hypercapnic ventilator dependent respiratory failure in the setting of COPD exacerbation and pneumonia.  Review of his multiple telemetry's and ECG recording suggest that he has had recurrent issues of atrial fibrillation with aberrant conduction and not ventricular tachycardia.  He has had issues with pulmonic infiltrates raising concern for aspiration pneumonia.  He is status postthoracentesis for moderate right-sided pleural effusion of which 300 cc of fluid was removed.  Following extubation, BiPAP was recommended but he has refused BiPAP treatment.  His mild troponin elevation on admission is most likely secondary to demand ischemia rather than ACS.  Presently, he is maintaining sinus rhythm at a ventricular rate of 78 and is on amiodarone 200 mg daily, metoprolol 25 mg twice a day, isosorbide (Ismo) 10 mg twice a day and atorvastatin 40 mg daily.  He is on aspirin 81 mg and Eliquis in addition to pantoprazole.  Presently, he appears improved with reference to volume status.  Consider transition from intravenous Lasix to oral Lasix 40 mg daily.  We will check a chemistry panel and BNP in a.m.  With significant lung disease will need to monitor amiodarone and will require LFT, TSH, and lung evaluations.   Troy Sine, MD, [facility] 07/23/2021 3:40 PM

## 2021-07-23 NOTE — Progress Notes (Signed)
Speech Language Pathology Treatment: Dysphagia  Patient Details Name: Ronald [name] MRN: [id number] DOB: 02-02-1946 Today's Date: 07/23/2021 Time: 4562-5638 SLP Time Calculation (min) (ACUTE ONLY): 10 min  Assessment / Plan / Recommendation Clinical Impression  Pt observed with pills with RN and thin water and graham cracker in pudding. Mastication and transit was swift without residual. Consumed one large pill with water and 1-2 smaller pills with RN with one delayed cough. Therapist regulated sip size to small. Will continue Dys 2 thin and next session attempt to upgrade, continue thin.    HPI HPI: Pt is a 76 yo male presenting with agitation and dyspnea felt to be due to COPD exacerbation vs aspiration PNA.  CT Chest showed new extensive alveolar infiltrates bilaterally but more so in the RLL. ETT 2/1-2/6. Evalauted by SLP on 2/7, MBS recommended but pt reintubated on 2/8-2/9 due to pulmonary edema.  MBS in September 2021 revealed a mild oral dysphagia, pharyngeal phase WNL; regular solids and thin liquids recommended. FEES in December 2022 after intubation recommended Dys 2 solids due to mild oropharyngeal dysphagia with residue and a single instance of sensed aspiration with effective cough. PEG 12/27 due to poor PO intake. PMH also includes: tracheal stenosis s/p trach (since decannulated), COPD, GERD, stroke, CAD, HTN, HLD, DMII      SLP Plan  Continue with current plan of care      Recommendations for follow up therapy are one component of a multi-disciplinary discharge planning process, led by the attending physician.  Recommendations may be updated based on patient status, additional functional criteria and insurance authorization.    Recommendations  Diet recommendations: Dysphagia 2 (fine chop);Thin liquid Liquids provided via: Cup;Straw Medication Administration: Whole meds with liquid (1 or 2 at a time) Supervision: Patient able to self feed Compensations: Slow rate;Small  sips/bites Postural Changes and/or Swallow Maneuvers: Seated upright 90 degrees                Oral Care Recommendations: Oral care BID Follow Up Recommendations: No SLP follow up Assistance recommended at discharge: Set up Supervision/Assistance SLP Visit Diagnosis: Dysphagia, oropharyngeal phase (R13.12) Plan: Continue with current plan of care           Houston Siren  07/23/2021, 11:34 AM  .llls
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
## 2021-07-23 NOTE — TOC Progression Note (Signed)
Transition of Care [facility]) - Progression Note    Patient Details  Name: Ronald [name] MRN: [id number] Date of Birth: 1945-06-15  Transition of Care [facility]) CM/SW Contact  Emeterio Reeve, [place] Phone Number: 07/23/2021, 3:22 PM  Clinical Narrative:     CSW spoke to pts son Ronald [name] on phone. Ronald [name] requested CSW speak to his wife Ronald [name]. CSW explained to Ronald [name] that pt is ready for DC. CSW explained that pt is in his medicare days and will have copays for SNF. Ronald [name] stated that pt has applied for Medicaid. CSW explained that medicaid takes 45-90 days to become effective, and pt will not be able to stay here to wait on that. Ronald [name] stated she understood.  CSW inquired about pt returning home. Ronald [name] states that pt lives with his ex wife and she can assist within reason. Ronald [name] stated for the pt to come home the he must be eating, no insulin, be able to ambulate short distances around house, cardiology to see, and the nodule on his chest to be evaluated further. CSW related information to MD. MD spoke with Ronald [name] on phone to address issues.   4:30 CSW and NCM spoke with pt, son Ronald [name] and daughter in law Ronald [name] at bedside. They decided to go home and resume [place] services. They will provide transportation home when ready. They want peg tube and Picc line to be removed prior to DC.   Expected Discharge Plan: [place] Barriers to Discharge: Continued Medical Work up, Other (must enter comment), SNF Pending bed offer (financial contraints with paying for SNF-copay days)  Expected Discharge Plan and Services Expected Discharge Plan: [place] In-house Referral: Clinical Social Work   Post Acute Care Choice:  (TBD) Living arrangements for the past 2 months: [place] Determinants of Health (SDOH) Interventions    Readmission Risk Interventions Readmission Risk Prevention Plan 09/28/2020  Transportation Screening  Complete  HRI or [place] Complete  Social Work Consult for [place] Planning/Counseling Complete  Palliative Care Screening Not Applicable  Medication Review [employer]) Complete  Some recent data might be hidden   Emeterio Reeve, LCSW Clinical Social Worker
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
## 2021-07-23 NOTE — TOC Progression Note (Signed)
Transition of Care [facility]) - Progression Note    Patient Details  Name: Ronald [name] MRN: [id number] Date of Birth: 1946-03-20  Transition of Care [facility]) CM/SW Contact  Quashawn Jewkes, Edson Snowball, RN Phone Number: 07/23/2021, 4:50 PM  Clinical Narrative:     Damaris Schooner to patient, Ferne Coe and [name] at bedside. Plan is to return to home at discharge. PAtient active with [facility] and they would like to continue services . NCM confirmed with Levada Dy with [facility] he is active for [facility] and PT . Will need resumption of care orders.   Patient has walker and 3 in 1 at home . Patient also has home oxygen through [facility]. [name] and Yorel Redder thought patient's portable concentrator was not functioning when he came to hospital they will check to see if it was serviced. NCM also called Freda Munro with [employer] and left message to check. Await call back  iExpected Discharge Plan: [place] Barriers to Discharge: Continued Medical Work up, Other (must enter comment), SNF Pending bed offer (financial contraints with paying for SNF-copay days)  Expected Discharge Plan and Services Expected Discharge Plan: [place] In-house Referral: Clinical Social Work   Post Acute Care Choice:  (TBD) Living arrangements for the past 2 months: Single Family Home                                       Social Determinants of Health (SDOH) Interventions    Readmission Risk Interventions Readmission Risk Prevention Plan 09/28/2020  Transportation Screening Complete  HRI or [place] Complete  Social Work Consult for [place] Planning/Counseling Complete  Palliative Care Screening Not Applicable  Medication Review Press photographer) Complete  Some recent data might be hidden

## 2021-07-24 ENCOUNTER — Inpatient Hospital Stay (HOSPITAL_COMMUNITY): Payer: Medicare HMO

## 2021-07-24 DIAGNOSIS — Z7189 Other specified counseling: Secondary | ICD-10-CM

## 2021-07-24 DIAGNOSIS — J9600 Acute respiratory failure, unspecified whether with hypoxia or hypercapnia: Secondary | ICD-10-CM

## 2021-07-24 DIAGNOSIS — Z515 Encounter for palliative care: Secondary | ICD-10-CM

## 2021-07-24 HISTORY — PX: IR GASTROSTOMY TUBE REMOVAL: IMG5492

## 2021-07-24 LAB — COMPREHENSIVE METABOLIC PANEL
ALT: 14 U/L (ref 0–44)
AST: 14 U/L — ABNORMAL LOW (ref 15–41)
Albumin: 2.6 g/dL — ABNORMAL LOW (ref 3.5–5.0)
Alkaline Phosphatase: 67 U/L (ref 38–126)
Anion gap: 10 (ref 5–15)
BUN: 27 mg/dL — ABNORMAL HIGH (ref 8–23)
CO2: 29 mmol/L (ref 22–32)
Calcium: 8.5 mg/dL — ABNORMAL LOW (ref 8.9–10.3)
Chloride: 97 mmol/L — ABNORMAL LOW (ref 98–111)
Creatinine, Ser: 1.13 mg/dL (ref 0.61–1.24)
GFR, Estimated: >60 mL/min (ref >60)
Glucose, Bld: 165 mg/dL — ABNORMAL HIGH (ref 70–99)
Potassium: 3.6 mmol/L (ref 3.5–5.1)
Sodium: 136 mmol/L (ref 135–145)
Total Bilirubin: 0.4 mg/dL (ref 0.3–1.2)
Total Protein: 6.3 g/dL — ABNORMAL LOW (ref 6.5–8.1)

## 2021-07-24 LAB — GLUCOSE, CAPILLARY
Glucose-Capillary: 109 mg/dL — ABNORMAL HIGH (ref 70–99)
Glucose-Capillary: 130 mg/dL — ABNORMAL HIGH (ref 70–99)
Glucose-Capillary: 140 mg/dL — ABNORMAL HIGH (ref 70–99)
Glucose-Capillary: 148 mg/dL — ABNORMAL HIGH (ref 70–99)
Glucose-Capillary: 153 mg/dL — ABNORMAL HIGH (ref 70–99)
Glucose-Capillary: 201 mg/dL — ABNORMAL HIGH (ref 70–99)

## 2021-07-24 LAB — CBC WITH DIFFERENTIAL/PLATELET
Abs Immature Granulocytes: 0.06 10*3/uL (ref 0.00–0.07)
Basophils Absolute: 0 10*3/uL (ref 0.0–0.1)
Basophils Relative: 0 %
Eosinophils Absolute: 0.3 10*3/uL (ref 0.0–0.5)
Eosinophils Relative: 2 %
HCT: 30.3 % — ABNORMAL LOW (ref 39.0–52.0)
Hemoglobin: 10.1 g/dL — ABNORMAL LOW (ref 13.0–17.0)
Immature Granulocytes: 1 %
Lymphocytes Relative: 15 %
Lymphs Abs: 1.5 10*3/uL (ref 0.7–4.0)
MCH: 28.6 pg (ref 26.0–34.0)
MCHC: 33.3 g/dL (ref 30.0–36.0)
MCV: 85.8 fL (ref 80.0–100.0)
Monocytes Absolute: 0.9 10*3/uL (ref 0.1–1.0)
Monocytes Relative: 9 %
Neutro Abs: 7.8 10*3/uL — ABNORMAL HIGH (ref 1.7–7.7)
Neutrophils Relative %: 73 %
Platelets: 326 10*3/uL (ref 150–400)
RBC: 3.53 MIL/uL — ABNORMAL LOW (ref 4.22–5.81)
RDW: 14.1 % (ref 11.5–15.5)
WBC: 10.5 10*3/uL (ref 4.0–10.5)
nRBC: 0 % (ref 0.0–0.2)

## 2021-07-24 MED ORDER — APIXABAN 5 MG PO TABS
5.0000 mg | ORAL_TABLET | Freq: Two times a day (BID) | ORAL | Status: DC
  Administered 2021-07-24 – 2021-07-25 (×2): 5 mg via ORAL
  Filled 2021-07-24 (×2): qty 1

## 2021-07-24 MED ORDER — ONDANSETRON HCL 4 MG PO TABS: 4.0000 mg | ORAL_TABLET | Freq: Four times a day (QID) | ORAL | Status: DC | PRN

## 2021-07-24 MED ORDER — FUROSEMIDE 40 MG PO TABS
40.0000 mg | ORAL_TABLET | Freq: Every day | ORAL | Status: DC
  Administered 2021-07-25: 40 mg via ORAL
  Filled 2021-07-24: qty 1

## 2021-07-24 MED ORDER — METOPROLOL TARTRATE 25 MG PO TABS
25.0000 mg | ORAL_TABLET | Freq: Two times a day (BID) | ORAL | Status: DC
  Administered 2021-07-24 – 2021-07-25 (×2): 25 mg via ORAL
  Filled 2021-07-24 (×2): qty 1

## 2021-07-24 MED ORDER — AMIODARONE HCL 200 MG PO TABS
200.0000 mg | ORAL_TABLET | Freq: Every day | ORAL | Status: DC
  Administered 2021-07-25: 200 mg via ORAL
  Filled 2021-07-24: qty 1

## 2021-07-24 MED ORDER — LORAZEPAM 2 MG/ML IJ SOLN
0.5000 mg | INTRAMUSCULAR | Status: AC | PRN
  Administered 2021-07-24: 0.5 mg via INTRAVENOUS
  Filled 2021-07-24: qty 1

## 2021-07-24 MED ORDER — QUETIAPINE FUMARATE 50 MG PO TABS
50.0000 mg | ORAL_TABLET | Freq: Every day | ORAL | Status: DC
  Administered 2021-07-24: 50 mg via ORAL
  Filled 2021-07-24: qty 1

## 2021-07-24 MED ORDER — SENNA 8.6 MG PO TABS: 1.0000 | ORAL_TABLET | Freq: Every day | ORAL | Status: DC | PRN

## 2021-07-24 MED ORDER — ACETAMINOPHEN 650 MG RE SUPP: 650.0000 mg | Freq: Four times a day (QID) | RECTAL | Status: DC | PRN

## 2021-07-24 MED ORDER — ONDANSETRON HCL 4 MG/2ML IJ SOLN: 4.0000 mg | Freq: Four times a day (QID) | INTRAMUSCULAR | Status: DC | PRN

## 2021-07-24 MED ORDER — ATORVASTATIN CALCIUM 40 MG PO TABS
40.0000 mg | ORAL_TABLET | Freq: Every day | ORAL | Status: DC
  Administered 2021-07-25: 40 mg via ORAL
  Filled 2021-07-24: qty 1

## 2021-07-24 MED ORDER — ASPIRIN 81 MG PO CHEW
81.0000 mg | CHEWABLE_TABLET | Freq: Every day | ORAL | Status: DC
  Administered 2021-07-25: 81 mg via ORAL
  Filled 2021-07-24: qty 1

## 2021-07-24 MED ORDER — CLONAZEPAM 0.25 MG PO TBDP
0.2500 mg | ORAL_TABLET | Freq: Two times a day (BID) | ORAL | Status: DC
  Administered 2021-07-24 – 2021-07-25 (×2): 0.25 mg via ORAL
  Filled 2021-07-24 (×2): qty 1

## 2021-07-24 MED ORDER — POTASSIUM CHLORIDE 20 MEQ PO PACK
40.0000 meq | PACK | Freq: Once | ORAL | Status: AC
  Administered 2021-07-24: 40 meq via ORAL
  Filled 2021-07-24: qty 2

## 2021-07-24 MED ORDER — CLONAZEPAM 0.25 MG PO TBDP
0.2500 mg | ORAL_TABLET | Freq: Once | ORAL | Status: AC
  Administered 2021-07-24: 0.25 mg via ORAL
  Filled 2021-07-24: qty 1

## 2021-07-24 MED ORDER — HALOPERIDOL LACTATE 5 MG/ML IJ SOLN
5.0000 mg | Freq: Four times a day (QID) | INTRAMUSCULAR | Status: DC | PRN
  Administered 2021-07-24: 5 mg via INTRAVENOUS
  Filled 2021-07-24: qty 1

## 2021-07-24 MED ORDER — PANTOPRAZOLE 2 MG/ML SUSPENSION
40.0000 mg | Freq: Every day | ORAL | Status: DC
  Administered 2021-07-25: 40 mg via ORAL
  Filled 2021-07-24: qty 20

## 2021-07-24 MED ORDER — ACETAMINOPHEN 325 MG PO TABS: 650.0000 mg | ORAL_TABLET | Freq: Four times a day (QID) | ORAL | Status: DC | PRN

## 2021-07-24 MED ORDER — LIDOCAINE VISCOUS HCL 2 % MT SOLN
OROMUCOSAL | Status: AC
  Filled 2021-07-24: qty 15

## 2021-07-24 NOTE — Progress Notes (Signed)
Nutrition Follow-up  DOCUMENTATION CODES:  Not applicable  INTERVENTION:  Continue DYS2 diet as recommended by SLP Ensure Enlive po BID, each supplement provides 350 kcal and 20 grams of protein. MVI with minerals daily  NUTRITION DIAGNOSIS:  Inadequate oral intake related to inability to eat as evidenced by NPO status. - Ongoing  GOAL:  Patient will meet greater than or equal to 90% of their needs - diet advanced, supplements to be added  MONITOR:  PO intake, Labs, Supplement acceptance  REASON FOR ASSESSMENT:  Ventilator, Consult Enteral/tube feeding initiation and management  ASSESSMENT:  76 yo male admitted with SOB, COPD exacerbation, tachycardia requiring cardioversion. PMH includes COPD Gold stage III/IV, tracheal stenosis, tracheostomy, PEG, CAD, HTN, HLD, HF, DM-2, chronic respiratory failure.  2/1 - admitted to ICU, intubated 2/6 - extubated 2/8 - re-intubated 2/9 - extubated 2/12 - transferred out of ICU 2/13 - MBS completed, DYS2 with thins started 2/15 - G-tube removed  Admit weight: 84 kg Current Weight:77.3 kg Net IO Since Admission: 4,201.77 mL [07/24/21 1314]  Pt resting in bed at the time of assessment. Pt sleeping and did not wake to his name being called or to touch. Noted pt sedated this AM from undergoing G-tube removal.  Pt underwent MBS 2/13 and was able to have diet advanced. Family then requested that TF be held to encourage appetite and then requested G-tube removal which was completed this AM. Only 1 meal recorded in EMR since diet advancement which was 100% consumed. Reviewed dining records and all meals have been ordered since diet advancement. Inquired about intake with RN, awaiting a response.   Noted weight loss has occurred this admission. Will add nutrition supplements to augment intake and closely monitor PO nutrition.   Average Meal Intake: 2/14: 100% intake x 1 recorded meals  Nutritionally Relevant Medications: Scheduled Meds:   atorvastatin  40 mg Per Tube Daily   furosemide  40 mg Intravenous BID   insulin aspart  0-5 Units Subcutaneous QHS   insulin aspart  0-9 Units Subcutaneous TID WC   pantoprazole sodium  40 mg Per Tube Daily   PRN Meds: bisacodyl, ondansetron  Labs Reviewed: BUN 27 SBG ranges from 77-170 mg/dL over the last 24 hours HgbA1c 6.0% (2/1)  Diet Order:   Diet Order             DIET DYS 2 Room service appropriate? Yes; Fluid consistency: Thin  Diet effective now                   EDUCATION NEEDS:  No education needs have been identified at this time  Skin:  Skin Assessment: Reviewed RN Assessment (Ecchymosis to the abdomen and bilateral arms)  Last BM:  2/14 - type 6  Height:  Ht Readings from Last 1 Encounters:  07/10/21 5\' 9"  (1.753 m)   Weight:  Wt Readings from Last 1 Encounters:  07/24/21 77.3 kg    BMI:  Body mass index is 25.17 kg/m.  Estimated Nutritional Needs:  Kcal:  2050-2250 Protein:  110-125 gm Fluid:  >/= 2.1 L   Ranell Patrick, RD, LDN Clinical Dietitian RD pager # available in [place]  After hours/weekend pager # available in [facility]

## 2021-07-24 NOTE — Progress Notes (Signed)
OT Cancellation Note  Patient Details Name: Ronald [name] MRN: [id number] DOB: 1946/03/15   Cancelled Treatment:    Reason Eval/Treat Not Completed: Fatigue/lethargy limiting ability to participate (Pt had Ativan earlier for removal of PEG tube, sleeping.)  Malka So 07/24/2021, 10:11 AM Nestor Lewandowsky, OTR/L Acute Rehabilitation Services Pager: [phone] Office: [phone]
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
## 2021-07-24 NOTE — Progress Notes (Signed)
Progress Note  Patient Name: Ronald [name] Date of Encounter: 07/24/2021  Primary Cardiologist: Carlyle Dolly, MD   Subjective   Patient seen and examined at his bedside. He was lying in bed when I arrived and offered no complaints at this time.  Inpatient Medications    Scheduled Meds:  [START ON 07/25/2021] amiodarone  200 mg Oral Daily   apixaban  5 mg Oral BID   arformoterol  15 mcg Nebulization BID   [START ON 07/25/2021] aspirin  81 mg Oral Daily   [START ON 07/25/2021] atorvastatin  40 mg Oral Daily   budesonide (PULMICORT) nebulizer solution  0.25 mg Nebulization BID   Chlorhexidine Gluconate Cloth  6 each Topical Daily   clonazepam  0.25 mg Oral BID   [START ON 07/25/2021] furosemide  40 mg Oral Daily   insulin aspart  0-5 Units Subcutaneous QHS   insulin aspart  0-9 Units Subcutaneous TID WC   isosorbide mononitrate  10 mg Oral BID   lidocaine       mouth rinse  15 mL Mouth Rinse BID   metoprolol tartrate  25 mg Oral BID   [START ON 07/25/2021] pantoprazole sodium  40 mg Oral Daily   potassium chloride  40 mEq Oral Once   QUEtiapine  50 mg Oral QHS   revefenacin  175 mcg Nebulization Daily   sodium chloride flush  10-40 mL Intracatheter Q12H   sodium chloride flush  3 mL Intravenous Q12H   Continuous Infusions:  PRN Meds: acetaminophen **OR** acetaminophen, bisacodyl, haloperidol lactate, hydrALAZINE, labetalol, levalbuterol, ondansetron **OR** ondansetron (ZOFRAN) IV, senna, sodium chloride, sodium chloride flush   Vital Signs    Vitals:   07/23/21 2050 07/24/21 0315 07/24/21 0500 07/24/21 0736  BP:  (!) 153/78  (!) 156/74  Pulse:  75  77  Resp:  17  16  Temp:  98.6 F (37 C)  98.9 F (37.2 C)  TempSrc:  Oral  Oral  SpO2: 95% 100%  100%  Weight:   77.3 kg   Height:        Intake/Output Summary (Last 24 hours) at 07/24/2021 1157 Last data filed at 07/24/2021 0103 Gross per 24 hour  Intake 340 ml  Output 1650 ml  Net -1310 ml   Filed Weights    07/21/21 0500 07/23/21 0450 07/24/21 0500  Weight: 77.7 kg 76.5 kg 77.3 kg    Telemetry     - Personally Reviewed  ECG    None today  - Personally Reviewed  Physical Exam   General: Comfortable Head: Atraumatic, normal size  Eyes: PEERLA, EOMI  Neck: Supple, normal JVD Cardiac: Normal S1, S2; RRR; no murmurs, rubs, or gallops Lungs: Clear to auscultation bilaterally Abd: Soft, nontender, no hepatomegaly  Ext: warm, no edema Musculoskeletal: No deformities, BUE and BLE strength normal and equal Skin: Warm and dry, no rashes   Neuro: Alert and oriented to person, place, time, and situation, CNII-XII grossly intact, no focal deficits  Psych: Normal mood and affect   Labs    Chemistry Recent Labs  Lab 07/21/21 0355 07/22/21 0429 07/24/21 0400  NA 145 143 136  K 4.1 3.7 3.6  CL 108 105 97*  CO2 30 32 29  GLUCOSE 152* 120* 165*  BUN 30* 29* 27*  CREATININE 1.07 1.09 1.13  CALCIUM 8.4* 8.1* 8.5*  PROT  --   --  6.3*  ALBUMIN  --   --  2.6*  AST  --   --  14*  ALT  --   --  14  ALKPHOS  --   --  67  BILITOT  --   --  0.4  GFRNONAA >60 >60 >60  ANIONGAP 7 6 10      Hematology Recent Labs  Lab 07/20/21 0424 07/21/21 0355 07/24/21 0400  WBC 8.8 9.7 10.5  RBC 3.42* 3.30* 3.53*  HGB 9.6* 9.0* 10.1*  HCT 30.3* 29.7* 30.3*  MCV 88.6 90.0 85.8  MCH 28.1 27.3 28.6  MCHC 31.7 30.3 33.3  RDW 15.2 15.3 14.1  PLT 295 297 326    Cardiac EnzymesNo results for input(s): TROPONINI in the last 168 hours. No results for input(s): TROPIPOC in the last 168 hours.   BNPNo results for input(s): BNP, PROBNP in the last 168 hours.   DDimer No results for input(s): DDIMER in the last 168 hours.   Radiology    DG Swallowing Func-Speech Pathology  Result Date: 07/22/2021 Table formatting from the original result was not included. Objective Swallowing Evaluation: Type of Study: MBS-Modified Barium Swallow Study  Patient Details Name: Ronald [name] MRN: [id number] Date of  Birth: 06-17-1945 Today's Date: 07/22/2021 Time: SLP Start Time (ACUTE ONLY): 0855 -SLP Stop Time (ACUTE ONLY): 0905 SLP Time Calculation (min) (ACUTE ONLY): 10 min Past Medical History: Past Medical History: Diagnosis Date  Anxiety   Asthma   Atrial flutter ([place]) 04/2021  Chronic lower back pain   COPD (chronic obstructive pulmonary disease) ([place])   Coronary artery disease   a. NSTEMI 05/2014 s/p DESx2 to LAD at [facility].  Depression   Educated about COVID-19 virus infection 03/06/2020  GERD (gastroesophageal reflux disease)   High cholesterol   Hypertension   NSTEMI (non-ST elevated myocardial infarction) ([place]) 05/2014  with stent placement  Sleep apnea   Stroke ([place]) 2017  anyeusym   TIA (transient ischemic attack)   "they say I've had some mini strokes; don't know when"; denies residual on 06/22/2014)  Type II diabetes mellitus ([place])   Ulcerative colitis ([place])  Past Surgical History: Past Surgical History: Procedure Laterality Date  APPENDECTOMY    BIOPSY  07/20/2020  Procedure: BIOPSY;  Surgeon: Harvel Quale, MD;  [facility];  Service: Gastroenterology;;  CARDIAC CATHETERIZATION  [phone] X 3  CHOLECYSTECTOMY    COLONOSCOPY WITH PROPOFOL N/A 07/20/2020  Procedure: COLONOSCOPY WITH PROPOFOL;  Surgeon: Harvel Quale, MD;  [facility];  Service: Gastroenterology;  Laterality: N/A;  1:15  CORONARY ANGIOPLASTY WITH STENT PLACEMENT  05/2014  "2"  ESOPHAGEAL DILATION N/A 07/20/2020  Procedure: ESOPHAGEAL DILATION;  Surgeon: Harvel Quale, MD;  [facility];  Service: Gastroenterology;  Laterality: N/A;  ESOPHAGOGASTRODUODENOSCOPY (EGD) WITH PROPOFOL N/A 07/20/2020  Procedure: ESOPHAGOGASTRODUODENOSCOPY (EGD) WITH PROPOFOL;  Surgeon: Harvel Quale, MD;  [facility];  Service: Gastroenterology;  Laterality: N/A;  IR GASTROSTOMY TUBE MOD SED  06/04/2021  LEFT HEART CATH AND CORONARY ANGIOGRAPHY N/A 05/25/2020  Procedure: LEFT HEART CATH  AND CORONARY ANGIOGRAPHY;  Surgeon: Martinique, Peter M, MD;  Location: [facility];  Service: Cardiovascular;  Laterality: N/A;  POLYPECTOMY  07/20/2020  Procedure: POLYPECTOMY INTESTINAL;  Surgeon: Montez Morita, Quillian Quince, MD;  [facility];  Service: Gastroenterology;;  TUMOR EXCISION Right ~ 1999  "side of my upper head" HPI: Pt is a 76 yo male presenting with agitation and dyspnea felt to be due to COPD exacerbation vs aspiration PNA.  CT Chest showed new extensive alveolar infiltrates bilaterally but more so in the RLL.  ETT 2/1-2/6. Evalauted by SLP on 2/7, MBS recommended but pt reintubated on 2/8-2/9 due to pulmonary edema.  MBS in September 2021 revealed a mild oral dysphagia, pharyngeal phase WNL; regular solids and thin liquids recommended. FEES in December 2022 after intubation recommended Dys 2 solids due to mild oropharyngeal dysphagia with residue and a single instance of sensed aspiration with effective cough. PEG 12/27 due to poor PO intake. PMH also includes: tracheal stenosis s/p trach (since decannulated), COPD, GERD, stroke, CAD, HTN, HLD, DMII  Subjective: confused  Recommendations for follow up therapy are one component of a multi-disciplinary discharge planning process, led by the attending physician.  Recommendations may be updated based on patient status, additional functional criteria and insurance authorization. Assessment / Plan / Recommendation Clinical Impressions 07/22/2021 Clinical Impression  Pt today presents with much improved respiratory function in compairson with bedside. Respiratory rate WNL, pt able to coordinate breathing and swallowing throughout exam without effort. Pt with a mild oral dysphagia secondary to missing dentition; pt with prolonged manipulation of cookie without crushing mechanism of gums. Partial cookie removed. Pt with piecemeal oral transit of most bolsues with transit to vallculae prior to the swallow with all partial boluses. There was one  instance of sensed aspiration before the swallow with an early sip, pt immediately ejected trace aspriation with a hard cough and otherwise demonstrated good strength and airway protection. Risk of aspiration will increase with episodes of respiratory distress. Pt should be aware of increased risk. Will resume a dys 2 diet and thin liquids with f/u at bedside to check for tolerance and educate pt.   SLP Visit Diagnosis Dysphagia, oropharyngeal phase (R13.12) Attention and concentration deficit following -- Frontal lobe and executive function deficit following -- Impact on safety and function Moderate aspiration risk   Treatment Recommendations 07/22/2021 Treatment Recommendations Therapy as outlined in treatment plan below   Prognosis 07/22/2021 Prognosis for Safe Diet Advancement Good Barriers to Reach Goals -- Barriers/Prognosis Comment -- Diet Recommendations 07/22/2021 SLP Diet Recommendations Thin liquid;Dysphagia 2 (Fine chop) solids Liquid Administration via Cup;Straw Medication Administration Whole meds with liquid Compensations Slow rate;Small sips/bites Postural Changes Seated upright at 90 degrees   Other Recommendations 07/22/2021 Recommended Consults -- Oral Care Recommendations Oral care BID Other Recommendations -- Follow Up Recommendations No SLP follow up Assistance recommended at discharge Set up Supervision/Assistance Functional Status Assessment Patient has had a recent decline in their functional status and demonstrates the ability to make significant improvements in function in a reasonable and predictable amount of time. Frequency and Duration  07/22/2021 Speech Therapy Frequency (ACUTE ONLY) min 2x/week Treatment Duration 1 week   Oral Phase 07/22/2021 Oral Phase Impaired Oral - Pudding Teaspoon -- Oral - Pudding Cup -- Oral - Honey Teaspoon -- Oral - Honey Cup -- Oral - Nectar Teaspoon -- Oral - Nectar Cup -- Oral - Nectar Straw -- Oral - Thin Teaspoon -- Oral - Thin Cup Piecemeal  swallowing;Premature spillage Oral - Thin Straw Premature spillage;Piecemeal swallowing Oral - Puree Piecemeal swallowing Oral - Mech Soft Piecemeal swallowing;Impaired mastication;Left pocketing in lateral sulci;Lingual/palatal residue Oral - Regular -- Oral - Multi-Consistency -- Oral - Pill Piecemeal swallowing Oral Phase - Comment --  Pharyngeal Phase 07/22/2021 Pharyngeal Phase Impaired Pharyngeal- Pudding Teaspoon -- Pharyngeal -- Pharyngeal- Pudding Cup -- Pharyngeal -- Pharyngeal- Honey Teaspoon -- Pharyngeal -- Pharyngeal- Honey Cup -- Pharyngeal -- Pharyngeal- Nectar Teaspoon -- Pharyngeal -- Pharyngeal- Nectar Cup -- Pharyngeal -- Pharyngeal- Nectar Straw -- Pharyngeal -- Pharyngeal- Thin Teaspoon --  Pharyngeal -- Pharyngeal- Thin Cup Delayed swallow initiation-vallecula;Penetration/Aspiration before swallow;Trace aspiration Pharyngeal Material enters airway, passes BELOW cords then ejected out;Material does not enter airway Pharyngeal- Thin Straw Delayed swallow initiation-vallecula Pharyngeal -- Pharyngeal- Puree Delayed swallow initiation-vallecula Pharyngeal -- Pharyngeal- Mechanical Soft Delayed swallow initiation-vallecula Pharyngeal -- Pharyngeal- Regular -- Pharyngeal -- Pharyngeal- Multi-consistency -- Pharyngeal -- Pharyngeal- Pill Delayed swallow initiation-vallecula Pharyngeal -- Pharyngeal Comment --  No flowsheet data found. DeBlois, Katherene Ponto 07/22/2021, 12:46 PM                     IR GASTROSTOMY TUBE REMOVAL/REPAIR  Result Date: 07/24/2021 INDICATION: 76 year old male s/p G-tube placement with IR on 06/04/2021. Patient passed speech evaluation for dysphagia 2 diet on 07/22/2021. Family requested G-tube to be removed. EXAM: REMOVAL OF GASTROSTOMY TUBE MEDICATIONS: Viscous Lidocaine COMPLICATIONS: None immediate. PROCEDURE: Verbal consent was obtained from the patient. Viscous lidocaine was used to locally anesthetize the tract. Using manual traction, the gastrostomy tube was  removed intact. IMPRESSION: Successful removal of gastrostomy tube. Read by: Durenda Guthrie, PA-C Electronically Signed   By: Corrie Mckusick D.O.   On: 07/24/2021 10:50    Cardiac Studies   TTE 05/07/2021 IMPRESSIONS   1. Left ventricular ejection fraction, by estimation, is 50 to 55%. The left ventricle has low normal function. The left ventricle has no regional wall motion abnormalities. Left ventricular diastolic function could not be evaluated.   2. Right ventricular systolic function is normal. The right ventricular size is normal. Tricuspid regurgitation signal is inadequate for assessing PA pressure.   3. The mitral valve is grossly normal. Trivial mitral valve regurgitation. No evidence of mitral stenosis.   4. The aortic valve is tricuspid. Aortic valve regurgitation is not visualized. No aortic stenosis is present.   Comparison(s): Changes from prior study are noted. EF~50-55%. Atrial flutter now present.   FINDINGS   Left Ventricle: Left ventricular ejection fraction, by estimation, is 50 to 55%. The left ventricle has low normal function. The left ventricle has no regional wall motion abnormalities. The left ventricular internal cavity size was normal in size.  There is no left ventricular hypertrophy. Left ventricular diastolic function could not be evaluated due to atrial fibrillation. Left  ventricular diastolic function could not be evaluated.   Right Ventricle: The right ventricular size is normal. No increase in right ventricular wall thickness. Right ventricular systolic function is normal. Tricuspid regurgitation signal is inadequate for assessing PA pressure.   Left Atrium: Left atrial size was normal in size.   Right Atrium: Right atrial size was normal in size.   Pericardium: There is no evidence of pericardial effusion. Presence of epicardial fat layer.   Mitral Valve: The mitral valve is grossly normal. Trivial mitral valve regurgitation. No evidence of mitral valve  stenosis.   Tricuspid Valve: The tricuspid valve is grossly normal. Tricuspid valve regurgitation is not demonstrated. No evidence of tricuspid stenosis.   Aortic Valve: The aortic valve is tricuspid. Aortic valve regurgitation is not visualized. No aortic stenosis is present. Aortic valve mean gradient measures 3.0 mmHg. Aortic valve peak gradient measures 4.7 mmHg. Aortic  valve area, by VTI measures 3.12  cm.   Pulmonic Valve: The pulmonic valve was grossly normal. Pulmonic valve  regurgitation is not visualized. No evidence of pulmonic stenosis.  Aorta: The aortic root is normal in size and structure.   Venous: IVC assessment for right atrial pressure unable to be performed  due to mechanical ventilation.   IAS/Shunts: The atrial  septum is grossly normal.      Patient Profile     77 y.o. male CAD s/p DES x 2 LAD (2015), HTN, HLD, DM2, CVA/TIA, COPD, chronic hypoxic respiratory failure, and tracheal stenosis were following for elevated troponin at this time.  Assessment & Plan    Acute hypoxic and hypercapnic respiratory failure with underlying COPD exacerbation-disease being managed by the primary team.  Coronary artery disease status post stent-with minimally elevated troponin which is likely in the setting of acute exacerbation of heart failure as well as paroxysmal atrial fibrillation.  No need for any further intervention at this time  Acute on chronic exacerbation of heart failure-has resolved, has been transitioned to p.o. Lasix.  Most recent output 1310 mL  Paroxysmal atrial fibrillation-continue with amiodarone, metoprolol as well as Eliquis.  Hypertension-blood pressure slightly elevated, will suggest starting low-dose losartan 25 mg daily.  Prediabetes-managed by primary team.  Tobacco use smoking cessation advised    For questions or updates, please contact [facility] Please consult [email or web address] for contact info under Cardiology/STEMI.       Signed, Berniece Salines, DO  07/24/2021, 11:57 AM

## 2021-07-24 NOTE — Procedures (Signed)
PROCEDURE SUMMARY:  Successful removal of gastrostomy tube.  No complications.   EBL = trace  Please see full dictation in imaging section of Epic for procedure details.   Armando Gang Aunya Lemler PA-C 07/24/2021 9:28 AM
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
## 2021-07-25 ENCOUNTER — Telehealth: Payer: Self-pay

## 2021-07-25 DIAGNOSIS — J441 Chronic obstructive pulmonary disease with (acute) exacerbation: Secondary | ICD-10-CM

## 2021-07-25 DIAGNOSIS — I509 Heart failure, unspecified: Secondary | ICD-10-CM

## 2021-07-25 LAB — GLUCOSE, CAPILLARY
Glucose-Capillary: 124 mg/dL — ABNORMAL HIGH (ref 70–99)
Glucose-Capillary: 166 mg/dL — ABNORMAL HIGH (ref 70–99)

## 2021-07-25 MED ORDER — LOSARTAN POTASSIUM 25 MG PO TABS: 25.0000 mg | ORAL_TABLET | Freq: Every day | ORAL | 0 refills | Status: DC

## 2021-07-25 MED ORDER — METOPROLOL TARTRATE 25 MG PO TABS: 25.0000 mg | ORAL_TABLET | Freq: Two times a day (BID) | ORAL | 0 refills | Status: DC

## 2021-07-25 MED ORDER — LOSARTAN POTASSIUM 25 MG PO TABS: 25.0000 mg | ORAL_TABLET | Freq: Every day | ORAL | Status: DC

## 2021-07-25 MED ORDER — ALPRAZOLAM 0.25 MG PO TABS: 0.5000 mg | ORAL_TABLET | Freq: Two times a day (BID) | ORAL | Status: DC | PRN

## 2021-07-25 NOTE — Telephone Encounter (Signed)
Referral placed.

## 2021-07-25 NOTE — Telephone Encounter (Signed)
Patient is being discharged from [facility] today and [facility] received a referral from the for palliative care for the patient.  Hospice is calling to see if you will be the physician of record for this patient and will send a referral for palliative for to them.
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
## 2021-07-25 NOTE — Care Management Important Message (Signed)
Important Message  Patient Details  Name: STEFFON GLADU MRN: [id number] Date of Birth: 1945/12/07   Medicare Important Message Given:  Yes     Hannah Beat 07/25/2021, 2:54 PM
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
## 2021-07-25 NOTE — Progress Notes (Signed)
Discharge instructions given to patients son. Patients daughter in law coming to pick up patient after work at [employer].

## 2021-07-25 NOTE — Progress Notes (Signed)
Oxygen tank delivered to patients room for discharge. Daughter in law on way to pick up patient.

## 2021-07-25 NOTE — Discharge Summary (Signed)
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 Verbal consent was obtained from the patient. Viscous lidocaine was used to locally anesthetize the tract. Using manual traction, the gastrostomy tube was removed intact. IMPRESSION: Successful removal of gastrostomy tube. Read by: Durenda Guthrie, PA-C Electronically Signed   By: Corrie Mckusick D.O.   On: 07/24/2021 10:50   [place] EKG SITE RITE  Result Date: 07/18/2021 If Site Rite image not attached, placement could not be confirmed due to current cardiac rhythm.    Subjective: - no chest pain, shortness of breath, no abdominal pain, nausea or vomiting.   Discharge Exam: BP (!) 115/46 (BP Location: Right Arm)    Pulse 71    Temp 97.7 F (36.5 C) (Oral)    Resp 19    Ht 5' 9"  (1.753 m)    Wt 76 kg    SpO2 93%    BMI 24.74 kg/m   General: Pt is alert, awake, not in acute distress Cardiovascular: RRR, S1/S2 +, no rubs, no gallops Respiratory: CTA bilaterally, no wheezing, no rhonchi Abdominal: Soft, NT, ND, bowel sounds + Extremities: no edema, no cyanosis  The results of  significant diagnostics from this hospitalization (including imaging, microbiology, ancillary and laboratory) are listed below for reference.     Microbiology: No results found for this or any previous visit (from the past 240 hour(s)).   Labs: Basic Metabolic Panel: Recent Labs  Lab 07/19/21 0354 07/19/21 1539 07/20/21 0424 07/20/21 1800 07/21/21 0355 07/22/21 0429 07/24/21 0400  NA 140   < > 145 144 145 143 136  K 3.8   < > 3.7 4.1 4.1 3.7 3.6  CL 107   < > 109 106 108 105 97*  CO2 28   < > 28 29 30  32 29  GLUCOSE 192*   < > 143* 194* 152* 120* 165*  BUN 35*   < > 31* 27* 30* 29* 27*  CREATININE 0.99   < > 0.95 1.03 1.07 1.09 1.13  CALCIUM 8.2*   < > 8.3* 8.6* 8.4* 8.1* 8.5*  MG 2.0  --  1.9  --   --  2.0  --    < > = values in this interval not displayed.   Liver Function Tests: Recent Labs  Lab 07/24/21 0400  AST 14*  ALT 14  ALKPHOS 67  BILITOT 0.4  PROT 6.3*  ALBUMIN 2.6*   CBC: Recent Labs  Lab 07/19/21 0354 07/19/21 1539 07/20/21 0424 07/21/21 0355 07/24/21 0400  WBC 7.8  --  8.8 9.7 10.5  NEUTROABS  --   --   --   --  7.8*  HGB 9.7* 10.2* 9.6* 9.0* 10.1*  HCT 30.5* 30.0* 30.3* 29.7* 30.3*  MCV 89.4  --  88.6 90.0 85.8  PLT 259  --  295 297 326   CBG: Recent Labs  Lab 07/24/21 1126 07/24/21 1638 07/24/21 2012 07/24/21 2351 07/25/21 0704  GLUCAP 130* 109* 201* 153* 124*   Hgb A1c No results for input(s): HGBA1C in the last 72 hours. Lipid Profile No results for input(s): CHOL, HDL, LDLCALC, TRIG, CHOLHDL, LDLDIRECT in the last 72 hours. Thyroid function studies No results for input(s): TSH, T4TOTAL, T3FREE, THYROIDAB in the last 72 hours.  Invalid input(s): FREET3 Urinalysis    Component Value Date/Time   COLORURINE YELLOW 07/10/2021 [place] 07/10/2021 0750   APPEARANCEUR Clear 04/18/2021 1539   LABSPEC >1.030 (H) 07/10/2021 0750   PHURINE 5.5 07/10/2021 0750   GLUCOSEU 100 (A) 07/10/2021 0750   HGBUR TRACE (A)  07/10/2021 0750   BILIRUBINUR NEGATIVE 07/10/2021 0750   BILIRUBINUR Negative 04/18/2021 1539   KETONESUR NEGATIVE 07/10/2021 0750   PROTEINUR 30 (A) 07/10/2021 0750   UROBILINOGEN 0.2 06/22/2014 1918   NITRITE NEGATIVE 07/10/2021 0750   LEUKOCYTESUR NEGATIVE 07/10/2021 0750    FURTHER DISCHARGE INSTRUCTIONS:   Get Medicines reviewed and adjusted: Please take all your medications with you for your next visit with your Primary MD   Laboratory/radiological data: Please request your Primary MD to go over all hospital tests and procedure/radiological results at the follow up, please ask your Primary MD to get all Hospital records sent to his/her office.   In some cases, they will be blood work, cultures and biopsy results pending at the time of your discharge. Please request that your primary care M.D. goes through all the records of your hospital data and follows up on these results.   Also Note the following: If you experience worsening of your admission symptoms, develop shortness of breath, life threatening emergency, suicidal or homicidal thoughts you must seek medical attention immediately by calling 911 or calling your MD immediately  if symptoms less severe.   You must read complete instructions/literature along with all the possible adverse reactions/side effects for all the Medicines you take and that have been prescribed to you. Take any new Medicines after you have completely understood and accpet all the possible adverse reactions/side effects.    Do not drive when taking Pain medications or sleeping medications (Benzodaizepines)   Do not take more than prescribed Pain, Sleep and Anxiety Medications. It is not advisable to combine anxiety,sleep and pain medications without talking with your primary care practitioner   Special Instructions: If you have smoked or chewed Tobacco  in the last 2 yrs please stop smoking, stop any regular Alcohol  and or any Recreational drug use.    Wear Seat belts while driving.   Please note: You were cared for by a hospitalist during your hospital stay. Once you are discharged, your primary care physician will handle any  further medical issues. Please note that NO REFILLS for any discharge medications will be authorized once you are discharged, as it is imperative that you return to your primary care physician (or establish a relationship with a primary care physician if you do not have one) for your post hospital discharge needs so that they can reassess your need for medications and monitor your lab values.  Time coordinating discharge: 35 minutes  SIGNED:  Marzetta Board, MD, PhD 07/25/2021, 11:31 AM

## 2021-07-25 NOTE — Care Management Important Message (Deleted)
Important Message  Patient Details  Name: Ronald [name] MRN: [id number] Date of Birth: 09-24-1945   Medicare Important Message Given:  Yes     Hannah Beat 07/25/2021, 3:22 PM

## 2021-07-25 NOTE — Progress Notes (Signed)
This chaplain responded to PMT NP-Julia consult for notarizing the Pt. HCPOA.    The chaplain understands the Pt discharge is planned for today. Notary services are not available today. The chaplain discussed alternative notary services with the PMT NP. The chaplain understands the PMT NP will F/U with the Pt.  Chaplain Sallyanne Kuster [phone]
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
## 2021-07-25 NOTE — TOC Progression Note (Addendum)
Transition of Care [facility]) - Progression Note    Patient Details  Name: Ronald [name] MRN: [id number] Date of Birth: [birth year]  Transition of Care [facility]) CM/SW Contact  Jacalyn Lefevre Edson Snowball, RN Phone Number: 07/25/2021, 12:15 PM  Clinical Narrative:     Patient for discharge today.   Messaged [name] with [facility].  Spoke with  son and daughter in [employer].  They will need portable oxygen tank from [place]. NCM ordered with Freda Munro with [facility] will call NCM back with an estimated delivery time.     He has a walker at home , they just want to use hospital walker to help him get in car. NCM explained hospital staff will assist he to car.    [name] (DIL) said she spoke to [place], [place] understood Gregary Signs that Gregary Signs would talk to patient today regarding HPOA. [name] would like a call from [place] .  Gregary Signs will call [name] regarding POA    They have a  child so they would like to know a time to pick patient up when every thing will be ready.   . [employer] will call NCM  with a estimated time the oxygen will be in the room ,and NCM will let nurse know . Expected Discharge Plan: Skilled Nursing Facility Barriers to Discharge: Continued Medical Work up, Other (must enter comment), SNF Pending bed offer (financial contraints with paying for SNF-copay days)  Expected Discharge Plan and Services Expected Discharge Plan: [place] In-house Referral: Clinical Social Work   Post Acute Care Choice:  (TBD) Living arrangements for the past 2 months: Single Family Home Expected Discharge Date: 07/25/21                                     Social Determinants of Health (SDOH) Interventions    Readmission Risk Interventions Readmission Risk Prevention Plan 09/28/2020  Transportation Screening Complete  HRI or [place] Complete  Social Work Consult for [place] Planning/Counseling Complete  Palliative Care Screening Not Applicable  Medication  Review Press photographer) Complete  Some recent data might be hidden

## 2021-07-26 ENCOUNTER — Encounter: Payer: Self-pay | Admitting: Family

## 2021-07-26 ENCOUNTER — Telehealth (INDEPENDENT_AMBULATORY_CARE_PROVIDER_SITE_OTHER): Payer: Medicare HMO | Admitting: Family

## 2021-07-26 ENCOUNTER — Telehealth: Payer: Self-pay | Admitting: Family

## 2021-07-26 DIAGNOSIS — Z8701 Personal history of pneumonia (recurrent): Secondary | ICD-10-CM

## 2021-07-26 DIAGNOSIS — E1142 Type 2 diabetes mellitus with diabetic polyneuropathy: Secondary | ICD-10-CM

## 2021-07-26 DIAGNOSIS — R911 Solitary pulmonary nodule: Secondary | ICD-10-CM

## 2021-07-26 DIAGNOSIS — J9602 Acute respiratory failure with hypercapnia: Secondary | ICD-10-CM | POA: Diagnosis not present

## 2021-07-26 DIAGNOSIS — I509 Heart failure, unspecified: Secondary | ICD-10-CM | POA: Diagnosis not present

## 2021-07-26 DIAGNOSIS — J9600 Acute respiratory failure, unspecified whether with hypoxia or hypercapnia: Secondary | ICD-10-CM

## 2021-07-26 DIAGNOSIS — I10 Essential (primary) hypertension: Secondary | ICD-10-CM | POA: Diagnosis not present

## 2021-07-26 DIAGNOSIS — J9611 Chronic respiratory failure with hypoxia: Secondary | ICD-10-CM | POA: Diagnosis not present

## 2021-07-26 NOTE — Progress Notes (Signed)
Virtual Visit Consent   [name], you are scheduled for a virtual visit with a [facility] provider today.     Just as with appointments in the office, your consent must be obtained to participate.  Your consent will be active for this visit and any virtual visit you may have with one of our providers in the next 365 days.     If you have a MyChart account, a copy of this consent can be sent to you electronically.  All virtual visits are billed to your insurance company just like a traditional visit in the office.    As this is a virtual visit, video technology does not allow for your provider to perform a traditional examination.  This may limit your provider's ability to fully assess your condition.  If your provider identifies any concerns that need to be evaluated in person or the need to arrange testing (such as labs, EKG, etc.), we will make arrangements to do so.     Although advances in technology are sophisticated, we cannot ensure that it will always work on either your end or our end.  If the connection with a video visit is poor, the visit may have to be switched to a telephone visit.  With either a video or telephone visit, we are not always able to ensure that we have a secure connection.     I need to obtain your verbal consent now.   Are you willing to proceed with your visit today?    Ronald [name] has provided verbal consent on 07/26/2021 for a virtual visit (video or telephone).   Ronald Dun, FNP   Date: 07/26/2021 4:02 PM   Virtual Visit via Video Note   I, Ronald [name], connected with  Ronald [name]  ([id number], [birth year]) on 07/26/21 at  2:40 PM EST by a video-enabled telemedicine application and verified that I am speaking with the correct person using two identifiers.  Location: Patient: Virtual Visit Location Patient: Home Provider: Virtual Visit Location Provider: Office/Clinic   I discussed the limitations of evaluation and management by telemedicine  and the availability of in person appointments. The patient expressed understanding and agreed to proceed.    History of Present Illness: Ronald [name] is a 76 y.o. who identifies as a male who was assigned male at birth, and is being seen today for hospital follow up. He had pneumonia and sepsis. He also went into CHF. He was intubated.   He had home health came to evaluate him today and stated he was "too sick" for their services and recommend palliative care. I have placed a referral, but they can not come out for two weeks.   He has a follow up Cardiologists 08/28/21. He has an follow up with [facility] 08/21/21.  He is on currently on 3 L of continuous oxygen. He is slightly confused at home times. He has COPD and continues to smoke.  HPI: Diabetes He presents for his follow-up diabetic visit. He has type 2 diabetes mellitus. Associated symptoms include blurred vision and foot paresthesias. Diabetic complications include heart disease. Risk factors for coronary artery disease include dyslipidemia, diabetes mellitus, hypertension and sedentary lifestyle. He is following a generally unhealthy diet. His overall blood glucose range is 140-180 mg/dl.   Problems:  Patient Active Problem List   Diagnosis Date Noted   Acute respiratory failure ([place]) 07/10/2021   Sepsis due to pneumonia ([place]) 07/10/2021   Pulmonary nodule 07/10/2021  Malnutrition of moderate degree 05/13/2021   Atrial flutter ([place]) 05/01/2021   Shock circulatory ([place]) 05/01/2021   Acute hypercapnic respiratory failure ([place]) 05/01/2021   Elevated brain natriuretic peptide (BNP) level 12/21/2020   Hypokalemia 12/21/2020   Hyperglycemia due to diabetes mellitus ([place]) 12/21/2020   Hyperlipidemia 12/21/2020   Chest pain 05/23/2020   Controlled substance agreement signed 04/03/2020   Benzodiazepine dependence ([place]) 04/03/2020   Dysphagia 02/27/2020   Tracheal stenosis 12/09/2019   Lymphedema 04/01/2019   CHF (congestive  heart failure) ([place]) 08/27/2018   Aortic atherosclerosis ([place]) 05/11/2017   Depression 05/05/2017   GERD (gastroesophageal reflux disease) 01/16/2017   Diabetic neuropathy ([place]) 01/16/2017   Anterolisthesis 12/04/2016   History of MI (myocardial infarction) 12/04/2016   OSA (obstructive sleep apnea) 12/04/2016   Closed fracture dislocation of lumbar spine ([place]) 07/25/2016   Closed fracture of body of sternum 07/25/2016   Multiple fractures of cervical spine ([place]) 07/25/2016   Anxiety 02/14/2016   Chronic respiratory failure with hypoxia ([place]) 07/19/2014   Essential hypertension 07/19/2014   Cigarette smoker 07/19/2014   Coronary artery disease 06/22/2014   Diabetes mellitus ([place]) 06/22/2014   COPD exacerbation ([place]) 06/22/2014   Tobacco abuse 06/22/2014   Acute encephalopathy 06/22/2014   Diabetes mellitus type 2 in obese ([place]) 05/11/2014   Simple chronic bronchitis ([place]) 05/11/2014    Allergies:  Allergies  Allergen Reactions   Gabapentin Anxiety    Unknown reaction   Metformin And Related Rash   Medications:  Current Outpatient Medications:    albuterol (VENTOLIN HFA) 108 (90 Base) MCG/ACT inhaler, INHALE 2 PUFFS EVERY 6 HOURS AS NEEEDED, Disp: 6.7 g, Rfl: 0   ALPRAZolam (XANAX) 0.5 MG tablet, Take 1 tablet (0.5 mg total) by mouth 2 (two) times daily as needed. for anxiety, Disp: 60 tablet, Rfl: 2   amiodarone (PACERONE) 200 MG tablet, Take 200 mg by mouth daily., Disp: , Rfl:    Ascorbic Acid (VITAMIN C) 1000 MG tablet, Take 1,000 mg by mouth daily., Disp: , Rfl:    atorvastatin (LIPITOR) 40 MG tablet, TAKE 1 TABLET BY MOUTH ONCE DAILY IN THE EVENING (Patient taking differently: Take 40 mg by mouth every evening.), Disp: 90 tablet, Rfl: 0   budesonide (PULMICORT) 0.5 MG/2ML nebulizer solution, Take 2 mLs (0.5 mg total) by nebulization 2 (two) times daily., Disp: 100 mL, Rfl: 12   ELIQUIS 5 MG TABS tablet, Take 5 mg by mouth 2 (two) times daily., Disp: , Rfl:    furosemide  (LASIX) 40 MG tablet, Take 40 mg by mouth daily., Disp: , Rfl:    ipratropium-albuterol (DUONEB) 0.5-2.5 (3) MG/3ML SOLN, Take 3 mLs by nebulization every 6 (six) hours as needed (asthma)., Disp: , Rfl:    isosorbide mononitrate (ISMO) 10 MG tablet, Take 10 mg by mouth 2 (two) times daily., Disp: , Rfl:    losartan (COZAAR) 25 MG tablet, Take 1 tablet (25 mg total) by mouth daily., Disp: 30 tablet, Rfl: 0   metoprolol tartrate (LOPRESSOR) 25 MG tablet, Take 1 tablet (25 mg total) by mouth 2 (two) times daily., Disp: 60 tablet, Rfl: 0   omeprazole (PRILOSEC) 20 MG capsule, Take 20 mg by mouth daily., Disp: , Rfl:    OXYGEN, Inhale 3 L into the lungs at bedtime., Disp: , Rfl:    blood glucose meter kit and supplies, Dispense based on patient and insurance preference. Use up to four times daily as directed. (FOR ICD-10 E10.9, E11.9)., Disp: 1 each, Rfl: 0  Blood Glucose Monitoring Suppl (ACCU-CHEK GUIDE ME) w/Device KIT, Check BS daily Dx E11.9, Disp: 1 kit, Rfl: 0   glucose blood (ONE TOUCH ULTRA TEST) test strip, USE TO CHECK GLUCOSE ONCE DAILY, Disp: 100 each, Rfl: 3   nitroGLYCERIN (NITROSTAT) 0.4 MG SL tablet, Place 0.4 mg under the tongue every 5 (five) minutes as needed for chest pain. (Patient not taking: Reported on 07/26/2021), Disp: , Rfl:   Observations/Objective: Patient is well-developed, well-nourished in no acute distress.  Resting comfortably  at home.  Head is normocephalic, atraumatic.  No labored breathing.  Speech is clear and coherent with logical content.  Patient is alert and oriented at baseline.  Wearing oxygen Weakness noted   Assessment and Plan: 1. Congestive heart failure, unspecified HF chronicity, unspecified heart failure type ([place]) - Ambulatory referral to [place]; Future - CBC with Differential/Platelet; Future  2. Essential hypertension - Ambulatory referral to [place]; Future - CBC with Differential/Platelet;  Future  3. Acute hypercapnic respiratory failure ([place]) - Ambulatory referral to [place]; Future - CBC with Differential/Platelet; Future - Brain natriuretic peptide; Future  4. Acute respiratory failure, unspecified whether with hypoxia or hypercapnia ([place]) - Ambulatory referral to [place]; Future - CBC with Differential/Platelet; Future  5. Chronic respiratory failure with hypoxia ([place]) - Ambulatory referral to [place]; Future - CBC with Differential/Platelet; Future  6. H/O: pneumonia - Ambulatory referral to [place]; Future - CBC with Differential/Platelet; Future - DG Chest 2 View; Future  7. Type 2 diabetes mellitus with diabetic polyneuropathy, without long-term current use of insulin ([place]) - Ambulatory referral to [place]; Future - CBC with Differential/Platelet; Future  Order for home health ordered  Labs and x-ray pending- will come Monday CT scan pending Smoking cessation discussed   Follow Up Instructions: I discussed the assessment and treatment plan with the patient. The patient was provided an opportunity to ask questions and all were answered. The patient agreed with the plan and demonstrated an understanding of the instructions.  A copy of instructions were sent to the patient via MyChart unless otherwise noted below.    The patient was advised to call back or seek an in-person evaluation if the symptoms worsen or if the condition fails to improve as anticipated.  Time:  I spent 23 minutes with the patient via telehealth technology discussing the above problems/concerns.    Ronald Dun, FNP
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
## 2021-07-29 ENCOUNTER — Telehealth: Payer: Self-pay | Admitting: Family

## 2021-07-29 DIAGNOSIS — J9612 Chronic respiratory failure with hypercapnia: Secondary | ICD-10-CM | POA: Diagnosis not present

## 2021-07-29 DIAGNOSIS — I5032 Chronic diastolic (congestive) heart failure: Secondary | ICD-10-CM | POA: Diagnosis not present

## 2021-07-29 DIAGNOSIS — J449 Chronic obstructive pulmonary disease, unspecified: Secondary | ICD-10-CM | POA: Diagnosis not present

## 2021-07-29 DIAGNOSIS — Z515 Encounter for palliative care: Secondary | ICD-10-CM | POA: Diagnosis not present

## 2021-07-29 DIAGNOSIS — J9611 Chronic respiratory failure with hypoxia: Secondary | ICD-10-CM | POA: Diagnosis not present

## 2021-07-30 NOTE — Telephone Encounter (Signed)
Can we send to another [facility] agency?  [name], FNP

## 2021-08-01 DIAGNOSIS — R69 Illness, unspecified: Secondary | ICD-10-CM | POA: Diagnosis not present

## 2021-08-01 DIAGNOSIS — K922 Gastrointestinal hemorrhage, unspecified: Secondary | ICD-10-CM | POA: Diagnosis not present

## 2021-08-01 DIAGNOSIS — E46 Unspecified protein-calorie malnutrition: Secondary | ICD-10-CM | POA: Diagnosis not present

## 2021-08-01 DIAGNOSIS — J441 Chronic obstructive pulmonary disease with (acute) exacerbation: Secondary | ICD-10-CM | POA: Diagnosis not present

## 2021-08-01 DIAGNOSIS — I2510 Atherosclerotic heart disease of native coronary artery without angina pectoris: Secondary | ICD-10-CM | POA: Diagnosis not present

## 2021-08-01 DIAGNOSIS — G9341 Metabolic encephalopathy: Secondary | ICD-10-CM | POA: Diagnosis not present

## 2021-08-01 DIAGNOSIS — I110 Hypertensive heart disease with heart failure: Secondary | ICD-10-CM | POA: Diagnosis not present

## 2021-08-01 DIAGNOSIS — I5030 Unspecified diastolic (congestive) heart failure: Secondary | ICD-10-CM | POA: Diagnosis not present

## 2021-08-01 DIAGNOSIS — J9602 Acute respiratory failure with hypercapnia: Secondary | ICD-10-CM | POA: Diagnosis not present

## 2021-08-01 DIAGNOSIS — Z431 Encounter for attention to gastrostomy: Secondary | ICD-10-CM | POA: Diagnosis not present

## 2021-08-02 DIAGNOSIS — R69 Illness, unspecified: Secondary | ICD-10-CM | POA: Diagnosis not present

## 2021-08-02 DIAGNOSIS — Z431 Encounter for attention to gastrostomy: Secondary | ICD-10-CM | POA: Diagnosis not present

## 2021-08-02 DIAGNOSIS — G9341 Metabolic encephalopathy: Secondary | ICD-10-CM | POA: Diagnosis not present

## 2021-08-02 DIAGNOSIS — J9602 Acute respiratory failure with hypercapnia: Secondary | ICD-10-CM | POA: Diagnosis not present

## 2021-08-02 DIAGNOSIS — I2510 Atherosclerotic heart disease of native coronary artery without angina pectoris: Secondary | ICD-10-CM | POA: Diagnosis not present

## 2021-08-02 DIAGNOSIS — K922 Gastrointestinal hemorrhage, unspecified: Secondary | ICD-10-CM | POA: Diagnosis not present

## 2021-08-02 DIAGNOSIS — J441 Chronic obstructive pulmonary disease with (acute) exacerbation: Secondary | ICD-10-CM | POA: Diagnosis not present

## 2021-08-02 DIAGNOSIS — E46 Unspecified protein-calorie malnutrition: Secondary | ICD-10-CM | POA: Diagnosis not present

## 2021-08-02 DIAGNOSIS — I110 Hypertensive heart disease with heart failure: Secondary | ICD-10-CM | POA: Diagnosis not present

## 2021-08-02 DIAGNOSIS — I5030 Unspecified diastolic (congestive) heart failure: Secondary | ICD-10-CM | POA: Diagnosis not present

## 2021-08-05 DIAGNOSIS — I5033 Acute on chronic diastolic (congestive) heart failure: Secondary | ICD-10-CM | POA: Diagnosis not present

## 2021-08-05 DIAGNOSIS — G9341 Metabolic encephalopathy: Secondary | ICD-10-CM | POA: Diagnosis not present

## 2021-08-05 DIAGNOSIS — I2510 Atherosclerotic heart disease of native coronary artery without angina pectoris: Secondary | ICD-10-CM | POA: Diagnosis not present

## 2021-08-05 DIAGNOSIS — I110 Hypertensive heart disease with heart failure: Secondary | ICD-10-CM | POA: Diagnosis not present

## 2021-08-05 DIAGNOSIS — J9621 Acute and chronic respiratory failure with hypoxia: Secondary | ICD-10-CM | POA: Diagnosis not present

## 2021-08-05 DIAGNOSIS — K922 Gastrointestinal hemorrhage, unspecified: Secondary | ICD-10-CM | POA: Diagnosis not present

## 2021-08-05 DIAGNOSIS — J9602 Acute respiratory failure with hypercapnia: Secondary | ICD-10-CM | POA: Diagnosis not present

## 2021-08-05 DIAGNOSIS — R69 Illness, unspecified: Secondary | ICD-10-CM | POA: Diagnosis not present

## 2021-08-05 DIAGNOSIS — E46 Unspecified protein-calorie malnutrition: Secondary | ICD-10-CM | POA: Diagnosis not present

## 2021-08-05 DIAGNOSIS — J441 Chronic obstructive pulmonary disease with (acute) exacerbation: Secondary | ICD-10-CM | POA: Diagnosis not present

## 2021-08-06 ENCOUNTER — Other Ambulatory Visit: Payer: Self-pay | Admitting: *Deleted

## 2021-08-06 DIAGNOSIS — J441 Chronic obstructive pulmonary disease with (acute) exacerbation: Secondary | ICD-10-CM | POA: Diagnosis not present

## 2021-08-06 DIAGNOSIS — J9621 Acute and chronic respiratory failure with hypoxia: Secondary | ICD-10-CM | POA: Diagnosis not present

## 2021-08-06 DIAGNOSIS — J9602 Acute respiratory failure with hypercapnia: Secondary | ICD-10-CM | POA: Diagnosis not present

## 2021-08-06 DIAGNOSIS — R69 Illness, unspecified: Secondary | ICD-10-CM | POA: Diagnosis not present

## 2021-08-06 DIAGNOSIS — G9341 Metabolic encephalopathy: Secondary | ICD-10-CM | POA: Diagnosis not present

## 2021-08-06 DIAGNOSIS — E46 Unspecified protein-calorie malnutrition: Secondary | ICD-10-CM | POA: Diagnosis not present

## 2021-08-06 DIAGNOSIS — K922 Gastrointestinal hemorrhage, unspecified: Secondary | ICD-10-CM | POA: Diagnosis not present

## 2021-08-06 DIAGNOSIS — I5033 Acute on chronic diastolic (congestive) heart failure: Secondary | ICD-10-CM | POA: Diagnosis not present

## 2021-08-06 DIAGNOSIS — I110 Hypertensive heart disease with heart failure: Secondary | ICD-10-CM | POA: Diagnosis not present

## 2021-08-06 DIAGNOSIS — J439 Emphysema, unspecified: Secondary | ICD-10-CM | POA: Diagnosis not present

## 2021-08-06 DIAGNOSIS — I2510 Atherosclerotic heart disease of native coronary artery without angina pectoris: Secondary | ICD-10-CM | POA: Diagnosis not present

## 2021-08-07 DIAGNOSIS — G9341 Metabolic encephalopathy: Secondary | ICD-10-CM | POA: Diagnosis not present

## 2021-08-07 DIAGNOSIS — J9621 Acute and chronic respiratory failure with hypoxia: Secondary | ICD-10-CM | POA: Diagnosis not present

## 2021-08-07 DIAGNOSIS — J9602 Acute respiratory failure with hypercapnia: Secondary | ICD-10-CM | POA: Diagnosis not present

## 2021-08-07 DIAGNOSIS — I5030 Unspecified diastolic (congestive) heart failure: Secondary | ICD-10-CM | POA: Diagnosis not present

## 2021-08-07 DIAGNOSIS — I110 Hypertensive heart disease with heart failure: Secondary | ICD-10-CM | POA: Diagnosis not present

## 2021-08-07 DIAGNOSIS — R69 Illness, unspecified: Secondary | ICD-10-CM | POA: Diagnosis not present

## 2021-08-07 DIAGNOSIS — E46 Unspecified protein-calorie malnutrition: Secondary | ICD-10-CM | POA: Diagnosis not present

## 2021-08-07 DIAGNOSIS — I5033 Acute on chronic diastolic (congestive) heart failure: Secondary | ICD-10-CM | POA: Diagnosis not present

## 2021-08-07 DIAGNOSIS — K922 Gastrointestinal hemorrhage, unspecified: Secondary | ICD-10-CM | POA: Diagnosis not present

## 2021-08-07 DIAGNOSIS — I2510 Atherosclerotic heart disease of native coronary artery without angina pectoris: Secondary | ICD-10-CM | POA: Diagnosis not present

## 2021-08-07 DIAGNOSIS — J441 Chronic obstructive pulmonary disease with (acute) exacerbation: Secondary | ICD-10-CM | POA: Diagnosis not present

## 2021-08-08 DIAGNOSIS — E46 Unspecified protein-calorie malnutrition: Secondary | ICD-10-CM | POA: Diagnosis not present

## 2021-08-08 DIAGNOSIS — J441 Chronic obstructive pulmonary disease with (acute) exacerbation: Secondary | ICD-10-CM | POA: Diagnosis not present

## 2021-08-08 DIAGNOSIS — I5033 Acute on chronic diastolic (congestive) heart failure: Secondary | ICD-10-CM | POA: Diagnosis not present

## 2021-08-08 DIAGNOSIS — I2510 Atherosclerotic heart disease of native coronary artery without angina pectoris: Secondary | ICD-10-CM | POA: Diagnosis not present

## 2021-08-08 DIAGNOSIS — J9602 Acute respiratory failure with hypercapnia: Secondary | ICD-10-CM | POA: Diagnosis not present

## 2021-08-08 DIAGNOSIS — K922 Gastrointestinal hemorrhage, unspecified: Secondary | ICD-10-CM | POA: Diagnosis not present

## 2021-08-08 DIAGNOSIS — G9341 Metabolic encephalopathy: Secondary | ICD-10-CM | POA: Diagnosis not present

## 2021-08-08 DIAGNOSIS — J9621 Acute and chronic respiratory failure with hypoxia: Secondary | ICD-10-CM | POA: Diagnosis not present

## 2021-08-08 DIAGNOSIS — R69 Illness, unspecified: Secondary | ICD-10-CM | POA: Diagnosis not present

## 2021-08-08 DIAGNOSIS — I110 Hypertensive heart disease with heart failure: Secondary | ICD-10-CM | POA: Diagnosis not present

## 2021-08-08 MED ORDER — FUROSEMIDE 40 MG PO TABS: 40.0000 mg | ORAL_TABLET | Freq: Every day | ORAL | 1 refills | Status: DC

## 2021-08-09 ENCOUNTER — Other Ambulatory Visit: Payer: Self-pay

## 2021-08-09 MED ORDER — ISOSORBIDE MONONITRATE 10 MG PO TABS: 10.0000 mg | ORAL_TABLET | Freq: Two times a day (BID) | ORAL | 5 refills | Status: DC

## 2021-08-09 MED ORDER — ELIQUIS 5 MG PO TABS: 5.0000 mg | ORAL_TABLET | Freq: Two times a day (BID) | ORAL | 2 refills | Status: DC

## 2021-08-09 MED ORDER — LOSARTAN POTASSIUM 25 MG PO TABS: 25.0000 mg | ORAL_TABLET | Freq: Every day | ORAL | 5 refills | Status: DC

## 2021-08-09 MED ORDER — AMIODARONE HCL 200 MG PO TABS: 200.0000 mg | ORAL_TABLET | Freq: Every day | ORAL | 2 refills | Status: DC

## 2021-08-09 NOTE — Telephone Encounter (Signed)
Last office visit 07/26/21 ?Medication on med list but not prescribed here before ?

## 2021-08-13 ENCOUNTER — Ambulatory Visit: Payer: Medicare HMO | Admitting: Family

## 2021-08-13 DIAGNOSIS — R69 Illness, unspecified: Secondary | ICD-10-CM | POA: Diagnosis not present

## 2021-08-13 DIAGNOSIS — J9621 Acute and chronic respiratory failure with hypoxia: Secondary | ICD-10-CM | POA: Diagnosis not present

## 2021-08-13 DIAGNOSIS — E46 Unspecified protein-calorie malnutrition: Secondary | ICD-10-CM | POA: Diagnosis not present

## 2021-08-13 DIAGNOSIS — G9341 Metabolic encephalopathy: Secondary | ICD-10-CM | POA: Diagnosis not present

## 2021-08-13 DIAGNOSIS — J441 Chronic obstructive pulmonary disease with (acute) exacerbation: Secondary | ICD-10-CM | POA: Diagnosis not present

## 2021-08-13 DIAGNOSIS — J9602 Acute respiratory failure with hypercapnia: Secondary | ICD-10-CM | POA: Diagnosis not present

## 2021-08-13 DIAGNOSIS — I110 Hypertensive heart disease with heart failure: Secondary | ICD-10-CM | POA: Diagnosis not present

## 2021-08-13 DIAGNOSIS — I2510 Atherosclerotic heart disease of native coronary artery without angina pectoris: Secondary | ICD-10-CM | POA: Diagnosis not present

## 2021-08-13 DIAGNOSIS — K922 Gastrointestinal hemorrhage, unspecified: Secondary | ICD-10-CM | POA: Diagnosis not present

## 2021-08-13 DIAGNOSIS — I5033 Acute on chronic diastolic (congestive) heart failure: Secondary | ICD-10-CM | POA: Diagnosis not present

## 2021-08-14 ENCOUNTER — Ambulatory Visit (INDEPENDENT_AMBULATORY_CARE_PROVIDER_SITE_OTHER): Payer: Medicare HMO | Admitting: Family

## 2021-08-14 ENCOUNTER — Encounter: Payer: Self-pay | Admitting: Family

## 2021-08-14 ENCOUNTER — Ambulatory Visit: Payer: Medicare HMO | Admitting: Family

## 2021-08-14 VITALS — BP 193/78 | HR 69 | Temp 98.0°F | Ht 69.0 in | Wt 179.6 lb

## 2021-08-14 DIAGNOSIS — J9611 Chronic respiratory failure with hypoxia: Secondary | ICD-10-CM

## 2021-08-14 DIAGNOSIS — I10 Essential (primary) hypertension: Secondary | ICD-10-CM | POA: Diagnosis not present

## 2021-08-14 DIAGNOSIS — E1142 Type 2 diabetes mellitus with diabetic polyneuropathy: Secondary | ICD-10-CM

## 2021-08-14 DIAGNOSIS — F419 Anxiety disorder, unspecified: Secondary | ICD-10-CM

## 2021-08-14 DIAGNOSIS — I252 Old myocardial infarction: Secondary | ICD-10-CM

## 2021-08-14 DIAGNOSIS — I509 Heart failure, unspecified: Secondary | ICD-10-CM | POA: Diagnosis not present

## 2021-08-14 DIAGNOSIS — Z79899 Other long term (current) drug therapy: Secondary | ICD-10-CM

## 2021-08-14 DIAGNOSIS — F1320 Sedative, hypnotic or anxiolytic dependence, uncomplicated: Secondary | ICD-10-CM

## 2021-08-14 DIAGNOSIS — R69 Illness, unspecified: Secondary | ICD-10-CM | POA: Diagnosis not present

## 2021-08-14 DIAGNOSIS — Z720 Tobacco use: Secondary | ICD-10-CM

## 2021-08-14 LAB — BAYER DCA HB A1C WAIVED: HB A1C (BAYER DCA - WAIVED): 5.8 % — ABNORMAL HIGH (ref 4.8–5.6)

## 2021-08-14 MED ORDER — LOSARTAN POTASSIUM 100 MG PO TABS: 100.0000 mg | ORAL_TABLET | Freq: Every day | ORAL | 1 refills | Status: DC

## 2021-08-14 MED ORDER — ALPRAZOLAM 0.5 MG PO TABS: 0.5000 mg | ORAL_TABLET | Freq: Two times a day (BID) | ORAL | 2 refills | Status: DC | PRN

## 2021-08-14 NOTE — Patient Instructions (Signed)

## 2021-08-14 NOTE — Progress Notes (Signed)
? ?Subjective:  ? ? Patient ID: Ronald [name], male    DOB: 06/30/1945, 76 y.o.   MRN: [id number] ? ?Chief Complaint  ?Patient presents with  ? Hospitalization Follow-up  ? ?Pt presents to the office today for chronic follow up. He has had multiple hospitalization and was discharged on 07/25/21. He currently has PT and OT. He has home health. He is currently using 4 L.  He has COPD, but quit smoking.  ? ?He has a follow up Cardiologists 08/28/21. He has an follow up with [facility] 08/21/21. ?Hypertension ?This is a chronic problem. The current episode started more than 1 year ago. The problem has been waxing and waning since onset. The problem is uncontrolled. Associated symptoms include anxiety, malaise/fatigue, peripheral edema and shortness of breath. Pertinent negatives include no blurred vision. Risk factors for coronary artery disease include dyslipidemia, obesity, male gender, smoking/tobacco exposure and sedentary lifestyle. The current treatment provides no improvement.  ?Diabetes ?He presents for his follow-up diabetic visit. He has type 2 diabetes mellitus. Hypoglycemia symptoms include nervousness/anxiousness. Associated symptoms include fatigue and foot paresthesias. Pertinent negatives for diabetes include no blurred vision. Symptoms are stable. Diabetic complications include heart disease and peripheral neuropathy. Risk factors for coronary artery disease include dyslipidemia, diabetes mellitus, hypertension and sedentary lifestyle. He is following a generally unhealthy diet. (Does not check BS at home )  ?Congestive Heart Failure ?Presents for follow-up visit. Associated symptoms include edema, fatigue and shortness of breath. The symptoms have been worsening.  ?Anxiety ?Presents for follow-up visit. Symptoms include excessive worry, irritability, nervous/anxious behavior and shortness of breath.  ? ? ? ? ? ?Review of Systems  ?Constitutional:  Positive for fatigue, irritability and malaise/fatigue.   ?Eyes:  Negative for blurred vision.  ?Respiratory:  Positive for shortness of breath.   ?Psychiatric/Behavioral:  The patient is nervous/anxious.   ?All other systems reviewed and are negative. ? ?   ?Objective:  ? Physical Exam ?Vitals reviewed.  ?Constitutional:   ?   General: He is not in acute distress. ?   Appearance: He is well-developed.  ?HENT:  ?   Head: Normocephalic.  ?   Right Ear: There is impacted cerumen.  ?   Left Ear: There is impacted cerumen.  ?Eyes:  ?   General:     ?   Right eye: No discharge.     ?   Left eye: No discharge.  ?   Pupils: Pupils are equal, round, and reactive to light.  ?Neck:  ?   Thyroid: No thyromegaly.  ?Cardiovascular:  ?   Rate and Rhythm: Normal rate and regular rhythm.  ?   Heart sounds: Normal heart sounds. No murmur heard. ?Pulmonary:  ?   Effort: Pulmonary effort is normal. No respiratory distress.  ?   Breath sounds: Wheezing and rhonchi present.  ?   Comments: 4L ?Abdominal:  ?   General: Bowel sounds are normal. There is no distension.  ?   Palpations: Abdomen is soft.  ?   Tenderness: There is no abdominal tenderness.  ?Musculoskeletal:     ?   General: No tenderness. Normal range of motion.  ?   Cervical back: Normal range of motion and neck supple.  ?   Right lower leg: Edema (trace) present.  ?   Left lower leg: Edema (trace) present.  ?   Comments: Lymphedema in bilateral arms  ?Skin: ?   General: Skin is warm and dry.  ?   Findings: No  erythema or rash.  ?Neurological:  ?   Mental Status: He is alert and oriented to person, place, and time.  ?   Cranial Nerves: No cranial nerve deficit.  ?   Motor: Weakness (patient in wheelchair) present.  ?   Deep Tendon Reflexes: Reflexes are normal and symmetric. Reflexes normal.  ?Psychiatric:     ?   Behavior: Behavior normal.     ?   Thought Content: Thought content normal.     ?   Judgment: Judgment normal.  ? ? ? ? ?BP (!) 196/82   Pulse 69   Temp 98 ?F (36.7 ?C) (Temporal)   Ht 5' 9" (1.753 m)   Wt 179 lb 9.6  oz (81.5 kg)   SpO2 95%   BMI 26.52 kg/m?  ? ?   ?Assessment & Plan:  ?Ronald [name] comes in today with chief complaint of Hospitalization Follow-up ? ? ?Diagnosis and orders addressed: ? ?1. Essential hypertension ?Will increase Losartan to 100 mg from 25 mg  ?-Daily blood pressure log given with instructions on how to fill out and told to bring to next visit ?-Dash diet information given ?-Exercise encouraged ?- Stress Management  ?-Continue current meds ?-Home health will monitor  ?- losartan (COZAAR) 100 MG tablet; Take 1 tablet (100 mg total) by mouth daily.  Dispense: 90 tablet; Refill: 1 ?- CMP14+EGFR ? ?2. Congestive heart failure, unspecified HF chronicity, unspecified heart failure type ([place]) ?- CMP14+EGFR ?- Brain natriuretic peptide ? ?3. Chronic respiratory failure with hypoxia (HCC) ?- CMP14+EGFR ? ?4. Type 2 diabetes mellitus with diabetic polyneuropathy, without long-term current use of insulin ([place]) ?- CMP14+EGFR ?- Bayer DCA Hb A1c Waived ?- Lipid panel ? ?5. Tobacco abuse ?- CMP14+EGFR ? ?6. Benzodiazepine dependence ([place]) ?- ALPRAZolam (XANAX) 0.5 MG tablet; Take 1 tablet (0.5 mg total) by mouth 2 (two) times daily as needed. for anxiety  Dispense: 60 tablet; Refill: 2 ?- CMP14+EGFR ? ?7. Controlled substance agreement signed ?- CMP14+EGFR ? ?8. History of MI (myocardial infarction) ?- CMP14+EGFR ? ?9. Anxiety ?- ALPRAZolam (XANAX) 0.5 MG tablet; Take 1 tablet (0.5 mg total) by mouth 2 (two) times daily as needed. for anxiety  Dispense: 60 tablet; Refill: 2 ?- CMP14+EGFR ? ? ?Labs pending ?Patient reviewed in [place] controlled database, no flags noted. Contract and drug screen updated on 01/21/21. ?Health Maintenance reviewed ?Diet and exercise encouraged ? ?Follow up plan: ?3 months  ? ? ?[name], FNP ? ? ?

## 2021-08-15 DIAGNOSIS — G9341 Metabolic encephalopathy: Secondary | ICD-10-CM | POA: Diagnosis not present

## 2021-08-15 DIAGNOSIS — J9621 Acute and chronic respiratory failure with hypoxia: Secondary | ICD-10-CM | POA: Diagnosis not present

## 2021-08-15 DIAGNOSIS — I5033 Acute on chronic diastolic (congestive) heart failure: Secondary | ICD-10-CM | POA: Diagnosis not present

## 2021-08-15 DIAGNOSIS — I2510 Atherosclerotic heart disease of native coronary artery without angina pectoris: Secondary | ICD-10-CM | POA: Diagnosis not present

## 2021-08-15 DIAGNOSIS — J9602 Acute respiratory failure with hypercapnia: Secondary | ICD-10-CM | POA: Diagnosis not present

## 2021-08-15 DIAGNOSIS — J441 Chronic obstructive pulmonary disease with (acute) exacerbation: Secondary | ICD-10-CM | POA: Diagnosis not present

## 2021-08-15 DIAGNOSIS — R69 Illness, unspecified: Secondary | ICD-10-CM | POA: Diagnosis not present

## 2021-08-15 DIAGNOSIS — K922 Gastrointestinal hemorrhage, unspecified: Secondary | ICD-10-CM | POA: Diagnosis not present

## 2021-08-15 DIAGNOSIS — I110 Hypertensive heart disease with heart failure: Secondary | ICD-10-CM | POA: Diagnosis not present

## 2021-08-15 DIAGNOSIS — E46 Unspecified protein-calorie malnutrition: Secondary | ICD-10-CM | POA: Diagnosis not present

## 2021-08-15 LAB — LIPID PANEL
Chol/HDL Ratio: 3.3 ratio (ref 0.0–5.0)
Cholesterol, Total: 156 mg/dL (ref 100–199)
HDL: 48 mg/dL (ref >39)
LDL Chol Calc (NIH): 81 mg/dL (ref 0–99)
Triglycerides: 157 mg/dL — ABNORMAL HIGH (ref 0–149)
VLDL Cholesterol Cal: 27 mg/dL (ref 5–40)

## 2021-08-15 LAB — CMP14+EGFR
ALT: 7 IU/L (ref 0–44)
AST: 11 IU/L (ref 0–40)
Albumin/Globulin Ratio: 1.5 (ref 1.2–2.2)
Albumin: 3.8 g/dL (ref 3.7–4.7)
Alkaline Phosphatase: 105 IU/L (ref 44–121)
BUN/Creatinine Ratio: 11 (ref 10–24)
BUN: 10 mg/dL (ref 8–27)
Bilirubin Total: 0.4 mg/dL (ref 0.0–1.2)
CO2: 33 mmol/L — ABNORMAL HIGH (ref 20–29)
Calcium: 9.2 mg/dL (ref 8.6–10.2)
Chloride: 98 mmol/L (ref 96–106)
Creatinine, Ser: 0.94 mg/dL (ref 0.76–1.27)
Globulin, Total: 2.6 g/dL (ref 1.5–4.5)
Glucose: 146 mg/dL — ABNORMAL HIGH (ref 70–99)
Potassium: 3.9 mmol/L (ref 3.5–5.2)
Sodium: 142 mmol/L (ref 134–144)
Total Protein: 6.4 g/dL (ref 6.0–8.5)
eGFR: 86 mL/min/{1.73_m2} (ref >59)

## 2021-08-15 LAB — BRAIN NATRIURETIC PEPTIDE: BNP: 296.5 pg/mL — ABNORMAL HIGH (ref 0.0–100.0)

## 2021-08-19 ENCOUNTER — Ambulatory Visit (HOSPITAL_COMMUNITY): Admission: RE | Admit: 2021-08-19 | Payer: Medicare HMO | Source: Ambulatory Visit

## 2021-08-20 DIAGNOSIS — J9602 Acute respiratory failure with hypercapnia: Secondary | ICD-10-CM | POA: Diagnosis not present

## 2021-08-20 DIAGNOSIS — I110 Hypertensive heart disease with heart failure: Secondary | ICD-10-CM | POA: Diagnosis not present

## 2021-08-20 DIAGNOSIS — I5033 Acute on chronic diastolic (congestive) heart failure: Secondary | ICD-10-CM | POA: Diagnosis not present

## 2021-08-20 DIAGNOSIS — E46 Unspecified protein-calorie malnutrition: Secondary | ICD-10-CM | POA: Diagnosis not present

## 2021-08-20 DIAGNOSIS — J441 Chronic obstructive pulmonary disease with (acute) exacerbation: Secondary | ICD-10-CM | POA: Diagnosis not present

## 2021-08-20 DIAGNOSIS — J9621 Acute and chronic respiratory failure with hypoxia: Secondary | ICD-10-CM | POA: Diagnosis not present

## 2021-08-20 DIAGNOSIS — G9341 Metabolic encephalopathy: Secondary | ICD-10-CM | POA: Diagnosis not present

## 2021-08-20 DIAGNOSIS — R69 Illness, unspecified: Secondary | ICD-10-CM | POA: Diagnosis not present

## 2021-08-20 DIAGNOSIS — I2510 Atherosclerotic heart disease of native coronary artery without angina pectoris: Secondary | ICD-10-CM | POA: Diagnosis not present

## 2021-08-20 DIAGNOSIS — K922 Gastrointestinal hemorrhage, unspecified: Secondary | ICD-10-CM | POA: Diagnosis not present

## 2021-08-21 ENCOUNTER — Ambulatory Visit (HOSPITAL_COMMUNITY): Payer: Medicare HMO | Admitting: Nurse Practitioner

## 2021-08-21 ENCOUNTER — Encounter (HOSPITAL_COMMUNITY): Payer: Self-pay

## 2021-08-21 DIAGNOSIS — J9612 Chronic respiratory failure with hypercapnia: Secondary | ICD-10-CM | POA: Diagnosis not present

## 2021-08-21 DIAGNOSIS — Z515 Encounter for palliative care: Secondary | ICD-10-CM | POA: Diagnosis not present

## 2021-08-21 DIAGNOSIS — J9611 Chronic respiratory failure with hypoxia: Secondary | ICD-10-CM | POA: Diagnosis not present

## 2021-08-21 DIAGNOSIS — J449 Chronic obstructive pulmonary disease, unspecified: Secondary | ICD-10-CM | POA: Diagnosis not present

## 2021-08-21 DIAGNOSIS — I5032 Chronic diastolic (congestive) heart failure: Secondary | ICD-10-CM | POA: Diagnosis not present

## 2021-08-22 DIAGNOSIS — E46 Unspecified protein-calorie malnutrition: Secondary | ICD-10-CM | POA: Diagnosis not present

## 2021-08-22 DIAGNOSIS — I5033 Acute on chronic diastolic (congestive) heart failure: Secondary | ICD-10-CM | POA: Diagnosis not present

## 2021-08-22 DIAGNOSIS — G9341 Metabolic encephalopathy: Secondary | ICD-10-CM | POA: Diagnosis not present

## 2021-08-22 DIAGNOSIS — I110 Hypertensive heart disease with heart failure: Secondary | ICD-10-CM | POA: Diagnosis not present

## 2021-08-22 DIAGNOSIS — I2510 Atherosclerotic heart disease of native coronary artery without angina pectoris: Secondary | ICD-10-CM | POA: Diagnosis not present

## 2021-08-22 DIAGNOSIS — J9621 Acute and chronic respiratory failure with hypoxia: Secondary | ICD-10-CM | POA: Diagnosis not present

## 2021-08-22 DIAGNOSIS — R69 Illness, unspecified: Secondary | ICD-10-CM | POA: Diagnosis not present

## 2021-08-22 DIAGNOSIS — J9602 Acute respiratory failure with hypercapnia: Secondary | ICD-10-CM | POA: Diagnosis not present

## 2021-08-22 DIAGNOSIS — J441 Chronic obstructive pulmonary disease with (acute) exacerbation: Secondary | ICD-10-CM | POA: Diagnosis not present

## 2021-08-22 DIAGNOSIS — K922 Gastrointestinal hemorrhage, unspecified: Secondary | ICD-10-CM | POA: Diagnosis not present

## 2021-08-26 ENCOUNTER — Other Ambulatory Visit: Payer: Self-pay

## 2021-08-26 MED ORDER — METOPROLOL TARTRATE 25 MG PO TABS: 25.0000 mg | ORAL_TABLET | Freq: Two times a day (BID) | ORAL | 2 refills | Status: DC

## 2021-08-27 DIAGNOSIS — E46 Unspecified protein-calorie malnutrition: Secondary | ICD-10-CM | POA: Diagnosis not present

## 2021-08-27 DIAGNOSIS — I110 Hypertensive heart disease with heart failure: Secondary | ICD-10-CM | POA: Diagnosis not present

## 2021-08-27 DIAGNOSIS — J9602 Acute respiratory failure with hypercapnia: Secondary | ICD-10-CM | POA: Diagnosis not present

## 2021-08-27 DIAGNOSIS — J441 Chronic obstructive pulmonary disease with (acute) exacerbation: Secondary | ICD-10-CM | POA: Diagnosis not present

## 2021-08-27 DIAGNOSIS — I5033 Acute on chronic diastolic (congestive) heart failure: Secondary | ICD-10-CM | POA: Diagnosis not present

## 2021-08-27 DIAGNOSIS — K922 Gastrointestinal hemorrhage, unspecified: Secondary | ICD-10-CM | POA: Diagnosis not present

## 2021-08-27 DIAGNOSIS — J9621 Acute and chronic respiratory failure with hypoxia: Secondary | ICD-10-CM | POA: Diagnosis not present

## 2021-08-27 DIAGNOSIS — R69 Illness, unspecified: Secondary | ICD-10-CM | POA: Diagnosis not present

## 2021-08-27 DIAGNOSIS — I2510 Atherosclerotic heart disease of native coronary artery without angina pectoris: Secondary | ICD-10-CM | POA: Diagnosis not present

## 2021-08-27 DIAGNOSIS — G9341 Metabolic encephalopathy: Secondary | ICD-10-CM | POA: Diagnosis not present

## 2021-08-27 NOTE — Progress Notes (Deleted)
? ?Cardiology Office Note ? ? ?Date:  08/27/2021  ? ?ID:  Ronald [name], DOB 1946-02-17, MRN [id number] ? ?PCP:  [name], FNP  ?Cardiologist:   Dorris Carnes, MD  ? ? ? ?  ?History of Present Illness: ?Ronald [name] is a 76 y.o. male with a history of ? ? ? ? ? ? ?No outpatient medications have been marked as taking for the 08/28/21 encounter (Appointment) with Fay Records, MD.  ? ? ? ?Allergies:   Gabapentin and Metformin and related  ? ?Past Medical History:  ?Diagnosis Date  ? Anxiety   ? Asthma   ? Atrial flutter ([place]) 04/2021  ? Chronic lower back pain   ? COPD (chronic obstructive pulmonary disease) ([place])   ? Coronary artery disease   ? a. NSTEMI 05/2014 s/p DESx2 to LAD at [facility].  ? Depression   ? Educated about COVID-19 virus infection 03/06/2020  ? GERD (gastroesophageal reflux disease)   ? High cholesterol   ? Hypertension   ? NSTEMI (non-ST elevated myocardial infarction) ([place]) 05/2014  ? with stent placement  ? Sleep apnea   ? Stroke [facility]) 2017  ? anyeusym   ? TIA (transient ischemic attack)   ? "they say I've had some mini strokes; don't know when"; denies residual on 06/22/2014)  ? Type II diabetes mellitus ([place])   ? Ulcerative colitis ([place])   ? ? ?Past Surgical History:  ?Procedure Laterality Date  ? APPENDECTOMY    ? BIOPSY  07/20/2020  ? Procedure: BIOPSY;  Surgeon: Montez Morita, Quillian Quince, MD;  [facility];  Service: Gastroenterology;;  ? CARDIAC CATHETERIZATION  1990's X 3  ? CHOLECYSTECTOMY    ? COLONOSCOPY WITH PROPOFOL N/A 07/20/2020  ? Procedure: COLONOSCOPY WITH PROPOFOL;  Surgeon: Harvel Quale, MD;  [facility];  Service: Gastroenterology;  Laterality: N/A;  1:15  ? CORONARY ANGIOPLASTY WITH STENT PLACEMENT  05/2014  ? "2"  ? ESOPHAGEAL DILATION N/A 07/20/2020  ? Procedure: ESOPHAGEAL DILATION;  Surgeon: Montez Morita, Quillian Quince, MD;  [facility];  Service: Gastroenterology;  Laterality: N/A;  ? ESOPHAGOGASTRODUODENOSCOPY (EGD) WITH  PROPOFOL N/A 07/20/2020  ? Procedure: ESOPHAGOGASTRODUODENOSCOPY (EGD) WITH PROPOFOL;  Surgeon: Harvel Quale, MD;  [facility];  Service: Gastroenterology;  Laterality: N/A;  ? IR GASTROSTOMY TUBE MOD SED  06/04/2021  ? IR GASTROSTOMY TUBE REMOVAL  07/24/2021  ? LEFT HEART CATH AND CORONARY ANGIOGRAPHY N/A 05/25/2020  ? Procedure: LEFT HEART CATH AND CORONARY ANGIOGRAPHY;  Surgeon: Martinique, Peter M, MD;  Location: [facility];  Service: Cardiovascular;  Laterality: N/A;  ? POLYPECTOMY  07/20/2020  ? Procedure: POLYPECTOMY INTESTINAL;  Surgeon: Montez Morita, Quillian Quince, MD;  [facility];  Service: Gastroenterology;;  ? TUMOR EXCISION Right ~ 1999  ? "side of my upper head"  ? ? ? ?Social History:  The patient  reports that he has been smoking cigarettes. He started smoking about 55 years ago. He has a 72.00 pack-year smoking history. He has never used smokeless tobacco. He reports that he does not currently use alcohol. He reports that he does not use drugs.  ? ?Family History:  The patient's family history includes CAD in his father; Cancer in his brother and brother; Dementia in his sister; Emphysema in his sister; Leukemia in his sister; Lung cancer in his brother; Stroke in his mother.  ? ? ?ROS:  Please see the history of present illness. All other systems are reviewed  and  Negative to the above problem except as noted.  ? ? ?PHYSICAL EXAM: ?VS:  There were no vitals taken for this visit.  ?GEN: Well nourished, well developed, in no acute distress  ?HEENT: normal  ?Neck: no JVD, carotid bruits, or masses ?Cardiac: RRR; no murmurs, rubs, or gallops,no edema  ?Respiratory:  clear to auscultation bilaterally, normal work of breathing ?GI: soft, nontender, nondistended, + BS  No hepatomegaly  ?MS: no deformity Moving all extremities   ?Skin: warm and dry, no rash ?Neuro:  Strength and sensation are intact ?Psych: euthymic mood, full affect ? ? ?EKG:  EKG is ordered  today. ? ? ?Lipid Panel ?   ?Component Value Date/Time  ? CHOL 156 08/14/2021 1531  ? TRIG 157 (H) 08/14/2021 1531  ? HDL 48 08/14/2021 1531  ? CHOLHDL 3.3 08/14/2021 1531  ? CHOLHDL 3.8 05/25/2020 0453  ? VLDL 29 05/25/2020 0453  ? [place] 81 08/14/2021 1531  ? ?  ? ?Wt Readings from Last 3 Encounters:  ?08/14/21 179 lb 9.6 oz (81.5 kg)  ?07/25/21 167 lb 8.8 oz (76 kg)  ?06/06/21 182 lb 15.7 oz (83 kg)  ?  ? ? ?ASSESSMENT AND PLAN: ? ? ? ? ?Current medicines are reviewed at length with the patient today.  The patient does not have concerns regarding medicines. ? ?Signed, ?Dorris Carnes, MD  ?08/27/2021 9:48 PM    ?[place] ?[address], [place], [place]  [address] ?Phone: [phone]; Fax: [phone]  ? ? ?

## 2021-08-28 ENCOUNTER — Ambulatory Visit: Payer: Medicare HMO | Admitting: Internal Medicine

## 2021-08-28 DIAGNOSIS — J9621 Acute and chronic respiratory failure with hypoxia: Secondary | ICD-10-CM | POA: Diagnosis not present

## 2021-08-28 DIAGNOSIS — R69 Illness, unspecified: Secondary | ICD-10-CM | POA: Diagnosis not present

## 2021-08-28 DIAGNOSIS — G9341 Metabolic encephalopathy: Secondary | ICD-10-CM | POA: Diagnosis not present

## 2021-08-28 DIAGNOSIS — I110 Hypertensive heart disease with heart failure: Secondary | ICD-10-CM | POA: Diagnosis not present

## 2021-08-28 DIAGNOSIS — K922 Gastrointestinal hemorrhage, unspecified: Secondary | ICD-10-CM | POA: Diagnosis not present

## 2021-08-28 DIAGNOSIS — I5033 Acute on chronic diastolic (congestive) heart failure: Secondary | ICD-10-CM | POA: Diagnosis not present

## 2021-08-28 DIAGNOSIS — E46 Unspecified protein-calorie malnutrition: Secondary | ICD-10-CM | POA: Diagnosis not present

## 2021-08-28 DIAGNOSIS — J441 Chronic obstructive pulmonary disease with (acute) exacerbation: Secondary | ICD-10-CM | POA: Diagnosis not present

## 2021-08-28 DIAGNOSIS — I2510 Atherosclerotic heart disease of native coronary artery without angina pectoris: Secondary | ICD-10-CM | POA: Diagnosis not present

## 2021-08-28 DIAGNOSIS — J9602 Acute respiratory failure with hypercapnia: Secondary | ICD-10-CM | POA: Diagnosis not present

## 2021-08-31 ENCOUNTER — Encounter (HOSPITAL_COMMUNITY): Payer: Self-pay | Admitting: Emergency Medicine

## 2021-08-31 ENCOUNTER — Emergency Department (HOSPITAL_COMMUNITY): Payer: Medicare HMO

## 2021-08-31 ENCOUNTER — Inpatient Hospital Stay (HOSPITAL_COMMUNITY)
Admission: EM | Admit: 2021-08-31 | Discharge: 2021-09-04 | DRG: 193 | Disposition: A | Discharge status: Home Health | Payer: Medicare HMO | Attending: Family Medicine | Admitting: Family Medicine

## 2021-08-31 ENCOUNTER — Other Ambulatory Visit: Payer: Self-pay

## 2021-08-31 DIAGNOSIS — J441 Chronic obstructive pulmonary disease with (acute) exacerbation: Secondary | ICD-10-CM | POA: Diagnosis present

## 2021-08-31 DIAGNOSIS — R0600 Dyspnea, unspecified: Secondary | ICD-10-CM | POA: Diagnosis not present

## 2021-08-31 DIAGNOSIS — Z9981 Dependence on supplemental oxygen: Secondary | ICD-10-CM

## 2021-08-31 DIAGNOSIS — I2510 Atherosclerotic heart disease of native coronary artery without angina pectoris: Secondary | ICD-10-CM | POA: Diagnosis not present

## 2021-08-31 DIAGNOSIS — Z20822 Contact with and (suspected) exposure to covid-19: Secondary | ICD-10-CM | POA: Diagnosis not present

## 2021-08-31 DIAGNOSIS — Z515 Encounter for palliative care: Secondary | ICD-10-CM

## 2021-08-31 DIAGNOSIS — Z955 Presence of coronary angioplasty implant and graft: Secondary | ICD-10-CM | POA: Diagnosis not present

## 2021-08-31 DIAGNOSIS — E1165 Type 2 diabetes mellitus with hyperglycemia: Secondary | ICD-10-CM | POA: Diagnosis present

## 2021-08-31 DIAGNOSIS — J9601 Acute respiratory failure with hypoxia: Secondary | ICD-10-CM

## 2021-08-31 DIAGNOSIS — D649 Anemia, unspecified: Secondary | ICD-10-CM | POA: Diagnosis not present

## 2021-08-31 DIAGNOSIS — I214 Non-ST elevation (NSTEMI) myocardial infarction: Secondary | ICD-10-CM | POA: Diagnosis not present

## 2021-08-31 DIAGNOSIS — I110 Hypertensive heart disease with heart failure: Secondary | ICD-10-CM | POA: Diagnosis present

## 2021-08-31 DIAGNOSIS — Z801 Family history of malignant neoplasm of trachea, bronchus and lung: Secondary | ICD-10-CM

## 2021-08-31 DIAGNOSIS — J9600 Acute respiratory failure, unspecified whether with hypoxia or hypercapnia: Principal | ICD-10-CM

## 2021-08-31 DIAGNOSIS — F419 Anxiety disorder, unspecified: Secondary | ICD-10-CM | POA: Diagnosis not present

## 2021-08-31 DIAGNOSIS — R778 Other specified abnormalities of plasma proteins: Secondary | ICD-10-CM | POA: Diagnosis not present

## 2021-08-31 DIAGNOSIS — G8929 Other chronic pain: Secondary | ICD-10-CM | POA: Diagnosis present

## 2021-08-31 DIAGNOSIS — I248 Other forms of acute ischemic heart disease: Secondary | ICD-10-CM | POA: Diagnosis present

## 2021-08-31 DIAGNOSIS — J159 Unspecified bacterial pneumonia: Secondary | ICD-10-CM | POA: Diagnosis not present

## 2021-08-31 DIAGNOSIS — Z888 Allergy status to other drugs, medicaments and biological substances status: Secondary | ICD-10-CM | POA: Diagnosis not present

## 2021-08-31 DIAGNOSIS — I252 Old myocardial infarction: Secondary | ICD-10-CM | POA: Diagnosis not present

## 2021-08-31 DIAGNOSIS — Z7901 Long term (current) use of anticoagulants: Secondary | ICD-10-CM

## 2021-08-31 DIAGNOSIS — E7800 Pure hypercholesterolemia, unspecified: Secondary | ICD-10-CM | POA: Diagnosis not present

## 2021-08-31 DIAGNOSIS — E876 Hypokalemia: Secondary | ICD-10-CM | POA: Diagnosis present

## 2021-08-31 DIAGNOSIS — J9622 Acute and chronic respiratory failure with hypercapnia: Secondary | ICD-10-CM | POA: Diagnosis present

## 2021-08-31 DIAGNOSIS — I5033 Acute on chronic diastolic (congestive) heart failure: Secondary | ICD-10-CM | POA: Diagnosis present

## 2021-08-31 DIAGNOSIS — F17210 Nicotine dependence, cigarettes, uncomplicated: Secondary | ICD-10-CM | POA: Diagnosis present

## 2021-08-31 DIAGNOSIS — R0689 Other abnormalities of breathing: Secondary | ICD-10-CM | POA: Diagnosis not present

## 2021-08-31 DIAGNOSIS — K219 Gastro-esophageal reflux disease without esophagitis: Secondary | ICD-10-CM | POA: Diagnosis not present

## 2021-08-31 DIAGNOSIS — M545 Low back pain, unspecified: Secondary | ICD-10-CM | POA: Diagnosis present

## 2021-08-31 DIAGNOSIS — I480 Paroxysmal atrial fibrillation: Secondary | ICD-10-CM | POA: Diagnosis present

## 2021-08-31 DIAGNOSIS — Z8249 Family history of ischemic heart disease and other diseases of the circulatory system: Secondary | ICD-10-CM

## 2021-08-31 DIAGNOSIS — Z7189 Other specified counseling: Secondary | ICD-10-CM | POA: Diagnosis not present

## 2021-08-31 DIAGNOSIS — J9621 Acute and chronic respiratory failure with hypoxia: Secondary | ICD-10-CM | POA: Diagnosis present

## 2021-08-31 DIAGNOSIS — J439 Emphysema, unspecified: Secondary | ICD-10-CM | POA: Diagnosis present

## 2021-08-31 DIAGNOSIS — R0602 Shortness of breath: Secondary | ICD-10-CM | POA: Diagnosis not present

## 2021-08-31 DIAGNOSIS — Z825 Family history of asthma and other chronic lower respiratory diseases: Secondary | ICD-10-CM

## 2021-08-31 DIAGNOSIS — Z806 Family history of leukemia: Secondary | ICD-10-CM

## 2021-08-31 DIAGNOSIS — Z7951 Long term (current) use of inhaled steroids: Secondary | ICD-10-CM

## 2021-08-31 DIAGNOSIS — Z823 Family history of stroke: Secondary | ICD-10-CM

## 2021-08-31 DIAGNOSIS — Z79899 Other long term (current) drug therapy: Secondary | ICD-10-CM

## 2021-08-31 DIAGNOSIS — Z743 Need for continuous supervision: Secondary | ICD-10-CM | POA: Diagnosis not present

## 2021-08-31 DIAGNOSIS — R9431 Abnormal electrocardiogram [ECG] [EKG]: Secondary | ICD-10-CM | POA: Diagnosis not present

## 2021-08-31 DIAGNOSIS — J9690 Respiratory failure, unspecified, unspecified whether with hypoxia or hypercapnia: Secondary | ICD-10-CM | POA: Diagnosis not present

## 2021-08-31 DIAGNOSIS — T380X5A Adverse effect of glucocorticoids and synthetic analogues, initial encounter: Secondary | ICD-10-CM | POA: Diagnosis present

## 2021-08-31 DIAGNOSIS — R069 Unspecified abnormalities of breathing: Secondary | ICD-10-CM | POA: Diagnosis not present

## 2021-08-31 DIAGNOSIS — R599 Enlarged lymph nodes, unspecified: Secondary | ICD-10-CM | POA: Diagnosis present

## 2021-08-31 DIAGNOSIS — Z8673 Personal history of transient ischemic attack (TIA), and cerebral infarction without residual deficits: Secondary | ICD-10-CM

## 2021-08-31 DIAGNOSIS — Z9049 Acquired absence of other specified parts of digestive tract: Secondary | ICD-10-CM

## 2021-08-31 DIAGNOSIS — Y92239 Unspecified place in hospital as the place of occurrence of the external cause: Secondary | ICD-10-CM | POA: Diagnosis not present

## 2021-08-31 DIAGNOSIS — R0902 Hypoxemia: Secondary | ICD-10-CM | POA: Diagnosis not present

## 2021-08-31 LAB — CBC WITH DIFFERENTIAL/PLATELET
Abs Immature Granulocytes: 0.11 10*3/uL — ABNORMAL HIGH (ref 0.00–0.07)
Basophils Absolute: 0.1 10*3/uL (ref 0.0–0.1)
Basophils Relative: 0 %
Eosinophils Absolute: 0 10*3/uL (ref 0.0–0.5)
Eosinophils Relative: 0 %
HCT: 39.7 % (ref 39.0–52.0)
Hemoglobin: 12.3 g/dL — ABNORMAL LOW (ref 13.0–17.0)
Immature Granulocytes: 1 %
Lymphocytes Relative: 6 %
Lymphs Abs: 0.9 10*3/uL (ref 0.7–4.0)
MCH: 26.4 pg (ref 26.0–34.0)
MCHC: 31 g/dL (ref 30.0–36.0)
MCV: 85.2 fL (ref 80.0–100.0)
Monocytes Absolute: 1 10*3/uL (ref 0.1–1.0)
Monocytes Relative: 6 %
Neutro Abs: 14.8 10*3/uL — ABNORMAL HIGH (ref 1.7–7.7)
Neutrophils Relative %: 87 %
Platelets: 506 10*3/uL — ABNORMAL HIGH (ref 150–400)
RBC: 4.66 MIL/uL (ref 4.22–5.81)
RDW: 13.3 % (ref 11.5–15.5)
WBC: 16.9 10*3/uL — ABNORMAL HIGH (ref 4.0–10.5)
nRBC: 0 % (ref 0.0–0.2)

## 2021-08-31 LAB — COMPREHENSIVE METABOLIC PANEL
ALT: 10 U/L (ref 0–44)
AST: 16 U/L (ref 15–41)
Albumin: 3.3 g/dL — ABNORMAL LOW (ref 3.5–5.0)
Alkaline Phosphatase: 80 U/L (ref 38–126)
Anion gap: 10 (ref 5–15)
BUN: 15 mg/dL (ref 8–23)
CO2: 31 mmol/L (ref 22–32)
Calcium: 8.5 mg/dL — ABNORMAL LOW (ref 8.9–10.3)
Chloride: 97 mmol/L — ABNORMAL LOW (ref 98–111)
Creatinine, Ser: 1.19 mg/dL (ref 0.61–1.24)
GFR, Estimated: >60 mL/min (ref >60)
Glucose, Bld: 228 mg/dL — ABNORMAL HIGH (ref 70–99)
Potassium: 3 mmol/L — ABNORMAL LOW (ref 3.5–5.1)
Sodium: 138 mmol/L (ref 135–145)
Total Bilirubin: 0.5 mg/dL (ref 0.3–1.2)
Total Protein: 6.8 g/dL (ref 6.5–8.1)

## 2021-08-31 LAB — PROTIME-INR
INR: 1.4 — ABNORMAL HIGH (ref 0.8–1.2)
Prothrombin Time: 17.4 seconds — ABNORMAL HIGH (ref 11.4–15.2)

## 2021-08-31 LAB — APTT: aPTT: 30 seconds (ref 24–36)

## 2021-08-31 LAB — TROPONIN I (HIGH SENSITIVITY)
Troponin I (High Sensitivity): 248 ng/L (ref <18)
Troponin I (High Sensitivity): 1351 ng/L (ref <18)
Troponin I (High Sensitivity): 1768 ng/L (ref <18)

## 2021-08-31 LAB — BLOOD GAS, ARTERIAL
Acid-Base Excess: 6.8 mmol/L — ABNORMAL HIGH (ref 0.0–2.0)
Bicarbonate: 33.7 mmol/L — ABNORMAL HIGH (ref 20.0–28.0)
Drawn by: 27016
FIO2: 100 %
O2 Saturation: 96.6 %
Patient temperature: 37
pCO2 arterial: 57 mmHg — ABNORMAL HIGH (ref 32–48)
pH, Arterial: 7.38 (ref 7.35–7.45)
pO2, Arterial: 80 mmHg — ABNORMAL LOW (ref 83–108)

## 2021-08-31 LAB — GLUCOSE, CAPILLARY
Glucose-Capillary: 218 mg/dL — ABNORMAL HIGH (ref 70–99)
Glucose-Capillary: 304 mg/dL — ABNORMAL HIGH (ref 70–99)

## 2021-08-31 LAB — LACTIC ACID, PLASMA
Lactic Acid, Venous: 2 mmol/L (ref 0.5–1.9)
Lactic Acid, Venous: 3.5 mmol/L (ref 0.5–1.9)

## 2021-08-31 LAB — RESP PANEL BY RT-PCR (FLU A&B, COVID) ARPGX2
Influenza A by PCR: NEGATIVE
Influenza B by PCR: NEGATIVE
SARS Coronavirus 2 by RT PCR: NEGATIVE

## 2021-08-31 LAB — BRAIN NATRIURETIC PEPTIDE: B Natriuretic Peptide: 250 pg/mL — ABNORMAL HIGH (ref 0.0–100.0)

## 2021-08-31 LAB — MRSA NEXT GEN BY PCR, NASAL: MRSA by PCR Next Gen: NOT DETECTED

## 2021-08-31 MED ORDER — METHYLPREDNISOLONE SODIUM SUCC 125 MG IJ SOLR
80.0000 mg | INTRAMUSCULAR | Status: AC
  Administered 2021-08-31 – 2021-09-01 (×2): 80 mg via INTRAVENOUS
  Filled 2021-08-31 (×2): qty 2

## 2021-08-31 MED ORDER — FUROSEMIDE 10 MG/ML IJ SOLN
40.0000 mg | Freq: Two times a day (BID) | INTRAMUSCULAR | Status: DC
Start: 2021-08-31 — End: 2021-09-01
  Administered 2021-08-31 – 2021-09-01 (×2): 40 mg via INTRAVENOUS
  Filled 2021-08-31 (×2): qty 4

## 2021-08-31 MED ORDER — SODIUM CHLORIDE 0.9 % IV SOLN
2.0000 g | Freq: Three times a day (TID) | INTRAVENOUS | Status: DC
  Administered 2021-08-31 – 2021-09-02 (×5): 2 g via INTRAVENOUS
  Filled 2021-08-31 (×5): qty 2

## 2021-08-31 MED ORDER — INSULIN ASPART 100 UNIT/ML IJ SOLN
0.0000 [IU] | Freq: Every day | INTRAMUSCULAR | Status: DC
  Administered 2021-08-31: 4 [IU] via SUBCUTANEOUS
  Administered 2021-09-02 – 2021-09-03 (×2): 3 [IU] via SUBCUTANEOUS

## 2021-08-31 MED ORDER — ALBUTEROL SULFATE (2.5 MG/3ML) 0.083% IN NEBU
2.5000 mg | INHALATION_SOLUTION | RESPIRATORY_TRACT | Status: DC | PRN
  Administered 2021-08-31 – 2021-09-02 (×3): 2.5 mg via RESPIRATORY_TRACT
  Filled 2021-08-31 (×3): qty 3

## 2021-08-31 MED ORDER — CHLORHEXIDINE GLUCONATE 0.12 % MT SOLN
15.0000 mL | Freq: Two times a day (BID) | OROMUCOSAL | Status: DC
  Administered 2021-08-31 – 2021-09-04 (×8): 15 mL via OROMUCOSAL
  Filled 2021-08-31 (×5): qty 15

## 2021-08-31 MED ORDER — IPRATROPIUM-ALBUTEROL 0.5-2.5 (3) MG/3ML IN SOLN
3.0000 mL | Freq: Three times a day (TID) | RESPIRATORY_TRACT | Status: DC
  Administered 2021-09-01: 3 mL via RESPIRATORY_TRACT
  Filled 2021-08-31: qty 3

## 2021-08-31 MED ORDER — IPRATROPIUM-ALBUTEROL 0.5-2.5 (3) MG/3ML IN SOLN
3.0000 mL | Freq: Four times a day (QID) | RESPIRATORY_TRACT | Status: DC
  Administered 2021-08-31: 3 mL via RESPIRATORY_TRACT
  Filled 2021-08-31: qty 3

## 2021-08-31 MED ORDER — LOSARTAN POTASSIUM 50 MG PO TABS
100.0000 mg | ORAL_TABLET | Freq: Every day | ORAL | Status: DC
  Administered 2021-09-01 – 2021-09-04 (×4): 100 mg via ORAL
  Filled 2021-08-31 (×4): qty 2

## 2021-08-31 MED ORDER — ORAL CARE MOUTH RINSE
15.0000 mL | Freq: Two times a day (BID) | OROMUCOSAL | Status: DC
  Administered 2021-09-02 – 2021-09-04 (×5): 15 mL via OROMUCOSAL

## 2021-08-31 MED ORDER — PANTOPRAZOLE SODIUM 40 MG PO TBEC
40.0000 mg | DELAYED_RELEASE_TABLET | Freq: Every day | ORAL | Status: DC
  Administered 2021-09-01 – 2021-09-04 (×4): 40 mg via ORAL
  Filled 2021-08-31 (×4): qty 1

## 2021-08-31 MED ORDER — ALBUTEROL SULFATE (2.5 MG/3ML) 0.083% IN NEBU
2.5000 mg | INHALATION_SOLUTION | Freq: Once | RESPIRATORY_TRACT | Status: AC
  Administered 2021-08-31: 2.5 mg via RESPIRATORY_TRACT
  Filled 2021-08-31: qty 3

## 2021-08-31 MED ORDER — ATORVASTATIN CALCIUM 40 MG PO TABS
40.0000 mg | ORAL_TABLET | Freq: Every evening | ORAL | Status: DC
  Administered 2021-08-31 – 2021-09-02 (×3): 40 mg via ORAL
  Filled 2021-08-31 (×3): qty 1

## 2021-08-31 MED ORDER — POTASSIUM CHLORIDE 10 MEQ/100ML IV SOLN
10.0000 meq | Freq: Once | INTRAVENOUS | Status: AC
Start: 2021-08-31 — End: 2021-08-31
  Administered 2021-08-31: 10 meq via INTRAVENOUS
  Filled 2021-08-31: qty 100

## 2021-08-31 MED ORDER — ALPRAZOLAM 0.25 MG PO TABS
0.2500 mg | ORAL_TABLET | Freq: Three times a day (TID) | ORAL | Status: DC | PRN
  Administered 2021-08-31 – 2021-09-01 (×2): 0.25 mg via ORAL
  Filled 2021-08-31 (×2): qty 1

## 2021-08-31 MED ORDER — SODIUM CHLORIDE 0.9 % IV SOLN
2.0000 g | Freq: Once | INTRAVENOUS | Status: AC
  Administered 2021-08-31: 2 g via INTRAVENOUS
  Filled 2021-08-31: qty 2

## 2021-08-31 MED ORDER — MAGNESIUM SULFATE 2 GM/50ML IV SOLN
2.0000 g | Freq: Once | INTRAVENOUS | Status: AC
  Administered 2021-08-31: 2 g via INTRAVENOUS
  Filled 2021-08-31: qty 50

## 2021-08-31 MED ORDER — IPRATROPIUM-ALBUTEROL 0.5-2.5 (3) MG/3ML IN SOLN
3.0000 mL | Freq: Once | RESPIRATORY_TRACT | Status: AC
  Administered 2021-08-31: 3 mL via RESPIRATORY_TRACT
  Filled 2021-08-31: qty 3

## 2021-08-31 MED ORDER — SODIUM CHLORIDE 0.9 % IV BOLUS
1000.0000 mL | Freq: Once | INTRAVENOUS | Status: AC
  Administered 2021-08-31: 1000 mL via INTRAVENOUS

## 2021-08-31 MED ORDER — INSULIN ASPART 100 UNIT/ML IJ SOLN
0.0000 [IU] | Freq: Three times a day (TID) | INTRAMUSCULAR | Status: DC
  Administered 2021-09-01: 4 [IU] via SUBCUTANEOUS
  Administered 2021-09-01 (×2): 7 [IU] via SUBCUTANEOUS
  Administered 2021-09-02: 11 [IU] via SUBCUTANEOUS
  Administered 2021-09-02: 7 [IU] via SUBCUTANEOUS
  Administered 2021-09-02: 11 [IU] via SUBCUTANEOUS
  Administered 2021-09-03: 4 [IU] via SUBCUTANEOUS
  Administered 2021-09-03 (×2): 7 [IU] via SUBCUTANEOUS
  Administered 2021-09-04: 3 [IU] via SUBCUTANEOUS
  Administered 2021-09-04: 4 [IU] via SUBCUTANEOUS

## 2021-08-31 MED ORDER — VANCOMYCIN HCL 1500 MG/300ML IV SOLN
1500.0000 mg | INTRAVENOUS | Status: DC
  Administered 2021-09-01: 1500 mg via INTRAVENOUS
  Filled 2021-08-31: qty 300

## 2021-08-31 MED ORDER — METOPROLOL TARTRATE 25 MG PO TABS
25.0000 mg | ORAL_TABLET | Freq: Two times a day (BID) | ORAL | Status: DC
  Administered 2021-08-31 – 2021-09-04 (×8): 25 mg via ORAL
  Filled 2021-08-31 (×8): qty 1

## 2021-08-31 MED ORDER — POTASSIUM CHLORIDE CRYS ER 20 MEQ PO TBCR
40.0000 meq | EXTENDED_RELEASE_TABLET | Freq: Once | ORAL | Status: DC
  Filled 2021-08-31: qty 2

## 2021-08-31 MED ORDER — AMIODARONE HCL 200 MG PO TABS
200.0000 mg | ORAL_TABLET | Freq: Every day | ORAL | Status: DC
  Administered 2021-09-01 – 2021-09-04 (×4): 200 mg via ORAL
  Filled 2021-08-31 (×4): qty 1

## 2021-08-31 MED ORDER — ISOSORBIDE MONONITRATE 20 MG PO TABS
10.0000 mg | ORAL_TABLET | Freq: Two times a day (BID) | ORAL | Status: DC
  Administered 2021-08-31 – 2021-09-04 (×8): 10 mg via ORAL
  Filled 2021-08-31 (×9): qty 1

## 2021-08-31 MED ORDER — SODIUM CHLORIDE 0.9 % IV SOLN: INTRAVENOUS | Status: DC

## 2021-08-31 MED ORDER — ACETAMINOPHEN 325 MG PO TABS
650.0000 mg | ORAL_TABLET | Freq: Four times a day (QID) | ORAL | Status: DC | PRN
  Administered 2021-09-03: 650 mg via ORAL
  Filled 2021-08-31 (×2): qty 2

## 2021-08-31 MED ORDER — VANCOMYCIN HCL IN DEXTROSE 1-5 GM/200ML-% IV SOLN
1000.0000 mg | Freq: Once | INTRAVENOUS | Status: AC
  Administered 2021-08-31: 1000 mg via INTRAVENOUS
  Filled 2021-08-31: qty 200

## 2021-08-31 MED ORDER — APIXABAN 5 MG PO TABS
5.0000 mg | ORAL_TABLET | Freq: Two times a day (BID) | ORAL | Status: DC
  Administered 2021-08-31 – 2021-09-01 (×2): 5 mg via ORAL
  Filled 2021-08-31 (×2): qty 1

## 2021-08-31 NOTE — ED Notes (Signed)
Report given to carelink 

## 2021-08-31 NOTE — H&P (Signed)
? ?NAME:  Ronald [name], MRN:  [id number], DOB:  1945-10-22, LOS: 0 ?ADMISSION DATE:  08/31/2021, CONSULTATION DATE: 3/25 ?REFERRING MD: Dr. Roderic Palau, CHIEF COMPLAINT: SOB ? ?History of Present Illness:  ?76 yo male former smoker with severe COPD on 3 liters home oxygen and diastolic CHF presented to [facility] with progressive dyspnea with hypoxia.  Started on Bipap.  Found to have asymmetric pulmonary infiltrate concerning for pneumonia versus CHF.  Transferred to [facility] for further management.  Has required intubation twice in the past 6 months for similar events. ? ?Pertinent  Medical History  ?DM 2, HTN, HLD, CAD s/p MI, s/p DES x2 to LAD 2015, HFpEF, Paroxysmal atrial fibrillation on eliquis, Chronic hypoxic respiratory failure on 3 liters, Tracheostomy with tracheal stenosis 2018, COPD, GERD, Anxiety, Lymphedema of arms ? ?Significant Hospital Events: ?Including procedures, antibiotic start and stop dates in addition to other pertinent events   ?3/25 Admit with SOB, cough. Concern for possible PNA vs CHF exacerbation.  ? ?Interim History / Subjective:  ?He was in usual state of health until 2 days prior to admission.  He then started getting more short of breath.  No inciting event.  He started getting anxious and then felt tight and heavy in his chest.  He was having wheeze and cough, but couldn't bring up sputum.  No fever.  He was using his breathing medicines at home, but these weren't working.  He was brought to [facility].  BP was 180/84 in ER.  He was started on increased supplemental oxygen and then Bipap.  He was given neb treatment, steroids, antibiotics.  He was transferred to [facility].  By the time he arrived to [facility] he was able to transition off [place]. ? ?Objective   ?Blood pressure (!) 179/85, pulse 67, temperature 97.6 ?F (36.4 ?C), temperature source Oral, resp. rate 17, height '5\' 9"'$  (1.753 m), weight 81.5 kg, SpO2 100 %. ?   ?   ? ?Intake/Output Summary (Last 24 hours) at 08/31/2021 1753 ?Last data filed at 08/31/2021  1556 ?Gross per 24 hour  ?Intake 200 ml  ?Output --  ?Net 200 ml  ? ?Filed Weights  ? 08/31/21 1334  ?Weight: 81.5 kg  ? ? ?Examination: ? ?General - alert ?Eyes - pupils reactive ?ENT - no sinus tenderness, no stridor ?Cardiac - irregular ?Chest - decreased BS b/l, faint bilateral expiratory wheezing ?Abdomen - soft, non tender, + bowel sounds ?Extremities - lymphedema of arms b/l, 1+ edema of legs ?Skin - no rashes ?Neuro - normal strength, moves extremities, follows commands ?Psych - normal mood and behavior ?  ? ?Resolved Hospital Problem list   ?  ? ?Assessment & Plan:  ? ?Acute on chronic hypoxic/hypercapnic respiratory failure. ?- likely from AECOPD with acute pulmonary edema and pneumonia ?- continue supplemental oxygen to keep SpO2 88 to 95% ?- Bipap prn ?- ABx day 1 ? ?COPD exacerbation with severe COPD from emphysema. ?- scheduled BDs ?- solumedrol 40 mg q12h ?- f/u CXR ? ?Acute on chronic diastolic CHF with acute pulmonary edema. ?Elevated troponin likely from demand ischemia. ?Hx of CAD s/p DES, PAF, HTN, HLD. ?- f/u Echo, troponin ?- lasix 40 mg IV q12h ?- continue eliquis, amiodarone, lipitor, isosorbide, cozaar, lopressor ? ?Rt mid lung field spiculated nodular density. ?- seen on CT chest from 07/10/21 ?- associated 2.4 cm subcarinal adenopathy ?- will need f/u CT chest while in hospital when respiratory status more stable ? ?Anxiety. ?- likely develops dynamic hyperinflation with anxiety ?- prn  xanax ? ?Hypokalemia. ?- f/u BMET ? ?Hx of GERD. ?- continue protonix ? ?DM type 2 poorly controlled with steroid induced hyperglycemia. ?- SSI ? ? ?Best Practice (right click and "Reselect all SmartList Selections" daily)  ? ?Diet/type: Regular consistency (see orders) ?DVT prophylaxis: DOAC ?GI prophylaxis: PPI ?Lines: N/A ?Foley:  N/A ?Code Status:  full code ?Last date of multidisciplinary goals of care discussion '[x]'$  updated pt's family at bedside ? ?Labs   ?CBC: ?Recent Labs  ?Lab 08/31/21 ?1345  ?WBC  16.9*  ?NEUTROABS 14.8*  ?HGB 12.3*  ?HCT 39.7  ?MCV 85.2  ?PLT 506*  ? ? ?Basic Metabolic Panel: ?Recent Labs  ?Lab 08/31/21 ?1345  ?NA 138  ?K 3.0*  ?CL 97*  ?CO2 31  ?GLUCOSE 228*  ?BUN 15  ?CREATININE 1.19  ?CALCIUM 8.5*  ? ?GFR: ?Estimated Creatinine Clearance: 55.3 mL/min (by C-G formula based on SCr of 1.19 mg/dL). ?Recent Labs  ?Lab 08/31/21 ?1345 08/31/21 ?1442  ?WBC 16.9*  --   ?LATICACIDVEN  --  2.0*  ? ? ?Liver Function Tests: ?Recent Labs  ?Lab 08/31/21 ?1345  ?AST 16  ?ALT 10  ?ALKPHOS 80  ?BILITOT 0.5  ?PROT 6.8  ?ALBUMIN 3.3*  ? ?No results for input(s): LIPASE, AMYLASE in the last 168 hours. ?No results for input(s): AMMONIA in the last 168 hours. ? ?ABG ?   ?Component Value Date/Time  ? PHART 7.38 08/31/2021 1440  ? PCO2ART 57 (H) 08/31/2021 1440  ? PO2ART 80 (L) 08/31/2021 1440  ? HCO3 33.7 (H) 08/31/2021 1440  ? TCO2 27 07/19/2021 1539  ? ACIDBASEDEF 4.0 (H) 07/10/2021 2013  ? O2SAT 96.6 08/31/2021 1440  ?  ? ?Coagulation Profile: ?Recent Labs  ?Lab 08/31/21 ?1442  ?INR 1.4*  ? ? ?Cardiac Enzymes: ?No results for input(s): CKTOTAL, CKMB, CKMBINDEX, TROPONINI in the last 168 hours. ? ?HbA1C: ?HB A1C (BAYER DCA - WAIVED)  ?Date/Time Value Ref Range Status  ?08/14/2021 03:28 PM 5.8 (H) 4.8 - 5.6 % Final  ?  Comment:  ?           Prediabetes: 5.7 - 6.4 ?         Diabetes: >6.4 ?         Glycemic control for adults with diabetes: <7.0 ?  ?09/12/2020 03:09 PM 6.8 <7.0 % Final  ?  Comment:  ?                                        Diabetic Adult            <7.0 ?                                      Healthy Adult        4.3 - 5.7 ?                                                          (DCCT/NGSP) ?American Diabetes Association's Summary of Glycemic Recommendations ?for Adults with Diabetes: Hemoglobin A1c <7.0%. More stringent ?glycemic goals (A1c <6.0%) may further reduce complications at the ?cost of increased risk of hypoglycemia. ?  ? ?  Hgb A1c MFr Bld  ?Date/Time Value Ref Range Status   ?07/10/2021 07:48 AM 6.0 (H) 4.8 - 5.6 % Final  ?  Comment:  ?  (NOTE) ?        Prediabetes: 5.7 - 6.4 ?        Diabetes: >6.4 ?        Glycemic control for adults with diabetes: <7.0 ?  ?05/02/2021 03:17 AM 7.5 (H) 4.8 - 5.6 % Final  ?  Comment:  ?  (NOTE) ?Pre diabetes:          5.7%-6.4% ? ?Diabetes:              >6.4% ? ?Glycemic control for   <7.0% ?adults with diabetes ?  ? ? ?CBG: ?Recent Labs  ?Lab 08/31/21 ?1742  ?GLUCAP 218*  ? ? ?Review of Systems:  ?Reviewed and negative ? ?Past Medical History:  ?He,  has a past medical history of Anxiety, Asthma, Atrial flutter ([place]) (04/2021), Chronic lower back pain, COPD (chronic obstructive pulmonary disease) ([place]), Coronary artery disease, Depression, Educated about COVID-19 virus infection (03/06/2020), GERD (gastroesophageal reflux disease), High cholesterol, Hypertension, NSTEMI (non-ST elevated myocardial infarction) ([place]) (05/2014), Sleep apnea, Stroke ([place]) (2017), TIA (transient ischemic attack), Type II diabetes mellitus ([place]), and Ulcerative colitis ([place]).  ? ?Surgical History:  ? ?Past Surgical History:  ?Procedure Laterality Date  ? APPENDECTOMY    ? BIOPSY  07/20/2020  ? Procedure: BIOPSY;  Surgeon: Montez Morita, Quillian Quince, MD;  [facility];  Service: Gastroenterology;;  ? CARDIAC CATHETERIZATION  1990's X 3  ? CHOLECYSTECTOMY    ? COLONOSCOPY WITH PROPOFOL N/A 07/20/2020  ? Procedure: COLONOSCOPY WITH PROPOFOL;  Surgeon: Harvel Quale, MD;  [facility];  Service: Gastroenterology;  Laterality: N/A;  1:15  ? CORONARY ANGIOPLASTY WITH STENT PLACEMENT  05/2014  ? "2"  ? ESOPHAGEAL DILATION N/A 07/20/2020  ? Procedure: ESOPHAGEAL DILATION;  Surgeon: Montez Morita, Quillian Quince, MD;  [facility];  Service: Gastroenterology;  Laterality: N/A;  ? ESOPHAGOGASTRODUODENOSCOPY (EGD) WITH PROPOFOL N/A 07/20/2020  ? Procedure: ESOPHAGOGASTRODUODENOSCOPY (EGD) WITH PROPOFOL;  Surgeon: Harvel Quale, MD;   [facility];  Service: Gastroenterology;  Laterality: N/A;  ? IR GASTROSTOMY TUBE MOD SED  06/04/2021  ? IR GASTROSTOMY TUBE REMOVAL  07/24/2021  ? LEFT HEART CATH AND CORONARY ANGIOGRAPHY N/A 05/25/2020  ?

## 2021-08-31 NOTE — ED Notes (Signed)
Date and time results received: 08/31/21 0340 ? ? ?Test: Lactic Acid ?Critical Value: 2.0 ? ?Name of Provider Notified: Dr. Roderic Palau ? ?Orders Received? Or Actions Taken?: see orders ?

## 2021-08-31 NOTE — ED Notes (Signed)
Respiratory at bedside.

## 2021-08-31 NOTE — ED Notes (Signed)
RT at bedside.

## 2021-08-31 NOTE — ED Triage Notes (Addendum)
Pt to the ED with shortness of breath and a history of COPD and bronchitis. ? ?Pt has labored breathing with inspiratory wheezing and rhonchi. ? ?Pt has had solumedrol, Atrovent, and a duoneb in route without relief. ? The patient has bilateral Upper extremity swelling. ? ?

## 2021-08-31 NOTE — Sepsis Progress Note (Signed)
Sepsis protocol is being followed by [facility]. 

## 2021-08-31 NOTE — Sepsis Progress Note (Signed)
Blood cultures drawn prior to antibiotic administration. ?

## 2021-08-31 NOTE — ED Notes (Signed)
RT called to assess pt ?

## 2021-08-31 NOTE — ED Provider Notes (Signed)
?[facility] [place] ?Provider Note ? ? ?CSN: [id number] ?Arrival date & time: 08/31/21  1329 ? ?  ? ?History ? ?Chief Complaint  ?Patient presents with  ? Shortness of Breath  ? ? ?Ronald [name] is a 76 y.o. male. ? ?Patient with shortness of breath.  He has a history of COPD and heart failure.  He also been coughing up yellow-green stuff. ? ?The history is provided by the patient and medical records. No language interpreter was used.  ?Shortness of Breath ?Severity:  Severe ?Onset quality:  Gradual ?Timing:  Constant ?Progression:  Worsening ?Chronicity:  Recurrent ?Relieved by:  Nothing ?Worsened by:  Nothing ?Ineffective treatments:  None tried ?Associated symptoms: no abdominal pain, no chest pain, no cough, no headaches and no rash   ? ?  ? ?Home Medications ?Prior to Admission medications   ?Medication Sig Start Date End Date Taking? Authorizing Provider  ?albuterol (VENTOLIN HFA) 108 (90 Base) MCG/ACT inhaler INHALE 2 PUFFS EVERY 6 HOURS AS NEEEDED 11/02/19   [name], FNP  ?ALPRAZolam (XANAX) 0.5 MG tablet Take 1 tablet (0.5 mg total) by mouth 2 (two) times daily as needed. for anxiety 08/14/21   [name], FNP  ?amiodarone (PACERONE) 200 MG tablet Take 1 tablet (200 mg total) by mouth daily. 08/09/21   [name], FNP  ?Ascorbic Acid (VITAMIN C) 1000 MG tablet Take 1,000 mg by mouth daily.    [provider]  ?atorvastatin (LIPITOR) 40 MG tablet TAKE 1 TABLET BY MOUTH ONCE DAILY IN THE EVENING ?Patient taking differently: Take 40 mg by mouth every evening. 12/05/20   [name], FNP  ?blood glucose meter kit and supplies Dispense based on patient and insurance preference. Use up to four times daily as directed. (FOR ICD-10 E10.9, E11.9). 07/05/21   [name], FNP  ?Blood Glucose Monitoring Suppl (ACCU-CHEK GUIDE ME) w/Device KIT Check BS daily Dx E11.9 05/16/20   [name] A, FNP  ?budesonide (PULMICORT) 0.5 MG/2ML nebulizer solution Take 2 mLs (0.5 mg  total) by nebulization 2 (two) times daily. 07/05/21   [name], FNP  ?ELIQUIS 5 MG TABS tablet Take 1 tablet (5 mg total) by mouth 2 (two) times daily. 08/09/21   [name], FNP  ?furosemide (LASIX) 40 MG tablet Take 1 tablet (40 mg total) by mouth daily. 08/08/21   [name], FNP  ?glucose blood (ONE TOUCH ULTRA TEST) test strip USE TO CHECK GLUCOSE ONCE DAILY 02/11/18   Terald Sleeper, PA-C  ?ipratropium-albuterol (DUONEB) 0.5-2.5 (3) MG/3ML SOLN Take 3 mLs by nebulization every 6 (six) hours as needed (asthma).    [provider]  ?isosorbide mononitrate (ISMO) 10 MG tablet Take 1 tablet (10 mg total) by mouth 2 (two) times daily. 08/09/21   [name], FNP  ?losartan (COZAAR) 100 MG tablet Take 1 tablet (100 mg total) by mouth daily. 08/14/21   [name], FNP  ?metoprolol tartrate (LOPRESSOR) 25 MG tablet Take 1 tablet (25 mg total) by mouth 2 (two) times daily. 08/26/21   [name], FNP  ?nitroGLYCERIN (NITROSTAT) 0.4 MG SL tablet Place 0.4 mg under the tongue every 5 (five) minutes as needed for chest pain.    [provider]  ?omeprazole (PRILOSEC) 20 MG capsule Take 20 mg by mouth daily. 07/05/21   [provider]  ?OXYGEN Inhale 3 L into the lungs at bedtime.    [provider]  ?   ? ?  Allergies    ?Gabapentin and Metformin and related   ? ?Review of Systems   ?Review of Systems  ?Constitutional:  Negative for appetite change and fatigue.  ?HENT:  Negative for congestion, ear discharge and sinus pressure.   ?Eyes:  Negative for discharge.  ?Respiratory:  Positive for shortness of breath. Negative for cough.   ?Cardiovascular:  Negative for chest pain.  ?Gastrointestinal:  Negative for abdominal pain and diarrhea.  ?Genitourinary:  Negative for frequency and hematuria.  ?Musculoskeletal:  Negative for back pain.  ?Skin:  Negative for rash.  ?Neurological:  Negative for seizures and headaches.  ?Psychiatric/Behavioral:  Negative for  hallucinations.   ? ?Physical Exam ?Updated Vital Signs ?BP 129/65   Pulse 63   Temp 97.6 ?F (36.4 ?C) (Oral)   Resp 20   Ht $R'5\' 9"'AE$  (1.753 m)   Wt 81.5 kg   SpO2 93%   BMI 26.52 kg/m?  ?Physical Exam ?Vitals and nursing note reviewed.  ?Constitutional:   ?   Appearance: He is well-developed.  ?HENT:  ?   Head: Normocephalic.  ?Eyes:  ?   General: No scleral icterus. ?   Conjunctiva/sclera: Conjunctivae normal.  ?Neck:  ?   Thyroid: No thyromegaly.  ?Cardiovascular:  ?   Rate and Rhythm: Regular rhythm. Tachycardia present.  ?   Heart sounds: No murmur heard. ?  No friction rub. No gallop.  ?Pulmonary:  ?   Breath sounds: No stridor. Wheezing present. No rales.  ?   Comments: Tachypneic ?Chest:  ?   Chest wall: No tenderness.  ?Abdominal:  ?   General: There is no distension.  ?   Tenderness: There is no abdominal tenderness. There is no rebound.  ?Musculoskeletal:     ?   General: Normal range of motion.  ?   Cervical back: Neck supple.  ?Lymphadenopathy:  ?   Cervical: No cervical adenopathy.  ?Skin: ?   Findings: No erythema or rash.  ?Neurological:  ?   Mental Status: He is alert and oriented to person, place, and time.  ?   Motor: No abnormal muscle tone.  ?   Coordination: Coordination normal.  ?Psychiatric:     ?   Behavior: Behavior normal.  ? ? ?ED Results / Procedures / Treatments   ?Labs ?(all labs ordered are listed, but only abnormal results are displayed) ?Labs Reviewed  ?CBC WITH DIFFERENTIAL/PLATELET - Abnormal; Notable for the following components:  ?    Result Value  ? WBC 16.9 (*)   ? Hemoglobin 12.3 (*)   ? Platelets 506 (*)   ? Neutro Abs 14.8 (*)   ? Abs Immature Granulocytes 0.11 (*)   ? All other components within normal limits  ?COMPREHENSIVE METABOLIC PANEL - Abnormal; Notable for the following components:  ? Potassium 3.0 (*)   ? Chloride 97 (*)   ? Glucose, Bld 228 (*)   ? Calcium 8.5 (*)   ? Albumin 3.3 (*)   ? All other components within normal limits  ?BRAIN NATRIURETIC PEPTIDE -  Abnormal; Notable for the following components:  ? B Natriuretic Peptide 250.0 (*)   ? All other components within normal limits  ?BLOOD GAS, ARTERIAL - Abnormal; Notable for the following components:  ? pCO2 arterial 57 (*)   ? pO2, Arterial 80 (*)   ? Bicarbonate 33.7 (*)   ? Acid-Base Excess 6.8 (*)   ? All other components within normal limits  ?TROPONIN I (HIGH SENSITIVITY) - Abnormal; Notable for the following  components:  ? Troponin I (High Sensitivity) 248 (*)   ? All other components within normal limits  ?RESP PANEL BY RT-PCR (FLU A&B, COVID) [id number]  ?CULTURE, BLOOD (ROUTINE X 2)  ?CULTURE, BLOOD (ROUTINE X 2)  ?LACTIC ACID, PLASMA  ?LACTIC ACID, PLASMA  ?PROTIME-INR  ?APTT  ?URINALYSIS, ROUTINE W REFLEX MICROSCOPIC  ? ? ?EKG ?EKG Interpretation ? ?Date/Time:  Saturday August 31 2021 13:35:16 EDT ?Ventricular Rate:  78 ?PR Interval:  177 ?QRS Duration: 117 ?QT Interval:  464 ?QTC Calculation: 529 ?R Axis:   68 ?Text Interpretation: Sinus rhythm Probable left atrial enlargement LVH with IVCD and secondary repol abnrm ST depr, consider ischemia, inferior leads Prolonged QT interval Baseline wander in lead(s) V1 Confirmed by Milton Ferguson [phone]) on 08/31/2021 2:46:38 PM ? ?Radiology ?DG Chest Port 1 View ? ?Result Date: 08/31/2021 ?CLINICAL DATA:  Shortness of breath. EXAM: PORTABLE CHEST 1 VIEW COMPARISON:  07/18/2021 and older exams.  Chest CT, 07/10/2021. FINDINGS: Interstitial and patchy airspace lung opacities are noted in the right mid to lower lung, and at the left lung base, consistent with multifocal pneumonia, similar to the chest radiograph from 07/17/2021. Lungs are hyperexpanded. The 2.5 cm irregular nodular opacity noted in the right upper lobe on the prior chest CT is not visualized, presumably obscured by the lung opacities. No convincing pleural effusion.  No pneumothorax. Cardiac silhouette is normal in size. No mediastinal or hilar masses. Skeletal structures are grossly intact. IMPRESSION:  1. Interstitial and airspace lung opacities right mid to lower lung and left lung base, consistent with multifocal pneumonia, similar appearance to the chest radiograph from 07/17/2021. Underlying COPD/emphysema. 2. Prior

## 2021-08-31 NOTE — Progress Notes (Signed)
Patient transferred from [facility] via [facility] on BIPAP. Patient placed on BIPAP once he arrived to unit. Patient asked about eating. Patient stated his breathing felt better so decision was made to trial patient off BIPAP and placed patient on HFNC. Patient does appear SOB he stated he felt better. Patient tolerating well at this time.  ?

## 2021-08-31 NOTE — Progress Notes (Signed)
Pharmacy Antibiotic Note ? ?Ronald [name] is a 76 y.o. male admitted on 08/31/2021 with sepsis.  Pharmacy has been consulted for Cefepime and Vancomycin dosing. ? ?Plan: ?Cefepime 2gm IV q8hrs ?Vancomycin 1500 mg IV Q 24 hrs. Goal AUC 400-550. ?Expected AUC: 508.3 ?SCr used: 1.19 ? ? ?Height: '5\' 9"'$  (175.3 cm) ?Weight: 81.5 kg (179 lb 9.6 oz) ?IBW/kg (Calculated) : 70.7 ? ?Temp (24hrs), Avg:97.6 ?F (36.4 ?C), Min:97.6 ?F (36.4 ?C), Max:97.6 ?F (36.4 ?C) ? ?Recent Labs  ?Lab 08/31/21 ?1345  ?WBC 16.9*  ?CREATININE 1.19  ?  ?Estimated Creatinine Clearance: 55.3 mL/min (by C-G formula based on SCr of 1.19 mg/dL).   ? ?Allergies  ?Allergen Reactions  ? Gabapentin Anxiety  ?  Unknown reaction  ? Metformin And Related Rash  ? ? ?Antimicrobials this admission: ?Cefepime 3/25 >>  ?Vancomycin 3/25 >>  ? ?Dose adjustments this admission: ? ? ?Microbiology results: ? BCx: pending ? UCx:   ? Sputum:   ? MRSA PCR:  ? ?Thank you for allowing pharmacy to be a part of this patient?s care. ? ?Hart Robinsons A ?08/31/2021 2:41 PM ? ?

## 2021-08-31 NOTE — ED Notes (Signed)
EMTALA documentation done by mistake. Not needed for transfer. ?

## 2021-09-01 ENCOUNTER — Inpatient Hospital Stay (HOSPITAL_COMMUNITY): Payer: Medicare HMO

## 2021-09-01 DIAGNOSIS — I5033 Acute on chronic diastolic (congestive) heart failure: Secondary | ICD-10-CM | POA: Diagnosis not present

## 2021-09-01 DIAGNOSIS — J9601 Acute respiratory failure with hypoxia: Secondary | ICD-10-CM | POA: Diagnosis not present

## 2021-09-01 DIAGNOSIS — R778 Other specified abnormalities of plasma proteins: Secondary | ICD-10-CM

## 2021-09-01 DIAGNOSIS — J9622 Acute and chronic respiratory failure with hypercapnia: Secondary | ICD-10-CM | POA: Diagnosis not present

## 2021-09-01 DIAGNOSIS — I214 Non-ST elevation (NSTEMI) myocardial infarction: Secondary | ICD-10-CM

## 2021-09-01 DIAGNOSIS — J441 Chronic obstructive pulmonary disease with (acute) exacerbation: Secondary | ICD-10-CM | POA: Diagnosis not present

## 2021-09-01 DIAGNOSIS — J9621 Acute and chronic respiratory failure with hypoxia: Secondary | ICD-10-CM | POA: Diagnosis not present

## 2021-09-01 LAB — BLOOD CULTURE ID PANEL (REFLEXED) - BCID2

## 2021-09-01 LAB — CBC
HCT: 33.5 % — ABNORMAL LOW (ref 39.0–52.0)
Hemoglobin: 10.7 g/dL — ABNORMAL LOW (ref 13.0–17.0)
MCH: 26.7 pg (ref 26.0–34.0)
MCHC: 31.9 g/dL (ref 30.0–36.0)
MCV: 83.5 fL (ref 80.0–100.0)
Platelets: 412 10*3/uL — ABNORMAL HIGH (ref 150–400)
RBC: 4.01 MIL/uL — ABNORMAL LOW (ref 4.22–5.81)
RDW: 13.3 % (ref 11.5–15.5)
WBC: 7.1 10*3/uL (ref 4.0–10.5)
nRBC: 0 % (ref 0.0–0.2)

## 2021-09-01 LAB — BASIC METABOLIC PANEL
Anion gap: 11 (ref 5–15)
BUN: 17 mg/dL (ref 8–23)
CO2: 28 mmol/L (ref 22–32)
Calcium: 8.3 mg/dL — ABNORMAL LOW (ref 8.9–10.3)
Chloride: 98 mmol/L (ref 98–111)
Creatinine, Ser: 1.16 mg/dL (ref 0.61–1.24)
GFR, Estimated: >60 mL/min (ref >60)
Glucose, Bld: 241 mg/dL — ABNORMAL HIGH (ref 70–99)
Potassium: 3.6 mmol/L (ref 3.5–5.1)
Sodium: 137 mmol/L (ref 135–145)

## 2021-09-01 LAB — GLUCOSE, CAPILLARY
Glucose-Capillary: 239 mg/dL — ABNORMAL HIGH (ref 70–99)
Glucose-Capillary: 213 mg/dL — ABNORMAL HIGH (ref 70–99)
Glucose-Capillary: 153 mg/dL — ABNORMAL HIGH (ref 70–99)

## 2021-09-01 LAB — ECHOCARDIOGRAM COMPLETE
Area-P 1/2: 4.68 cm2
Height: 69 in
S' Lateral: 3.8 cm
Weight: 2920.65 oz

## 2021-09-01 LAB — TROPONIN I (HIGH SENSITIVITY)
Troponin I (High Sensitivity): 2161 ng/L (ref <18)
Troponin I (High Sensitivity): 2110 ng/L (ref <18)

## 2021-09-01 LAB — MAGNESIUM: Magnesium: 1.8 mg/dL (ref 1.7–2.4)

## 2021-09-01 MED ORDER — CHLORHEXIDINE GLUCONATE CLOTH 2 % EX PADS
6.0000 | MEDICATED_PAD | Freq: Every day | CUTANEOUS | Status: DC
  Administered 2021-09-01 – 2021-09-04 (×4): 6 via TOPICAL

## 2021-09-01 MED ORDER — HEPARIN (PORCINE) 25000 UT/250ML-% IV SOLN
1650.0000 [IU]/h | INTRAVENOUS | Status: DC
  Administered 2021-09-01: 1300 [IU]/h via INTRAVENOUS
  Administered 2021-09-03: 1650 [IU]/h via INTRAVENOUS
  Filled 2021-09-01 (×3): qty 250

## 2021-09-01 MED ORDER — MAGNESIUM SULFATE 2 GM/50ML IV SOLN
2.0000 g | Freq: Once | INTRAVENOUS | Status: AC
  Administered 2021-09-01: 2 g via INTRAVENOUS
  Filled 2021-09-01: qty 50

## 2021-09-01 MED ORDER — HEPARIN (PORCINE) 25000 UT/250ML-% IV SOLN: 1300.0000 [IU]/h | INTRAVENOUS | Status: DC

## 2021-09-01 MED ORDER — FUROSEMIDE 10 MG/ML IJ SOLN: 40.0000 mg | Freq: Every day | INTRAMUSCULAR | Status: DC

## 2021-09-01 MED ORDER — IPRATROPIUM-ALBUTEROL 0.5-2.5 (3) MG/3ML IN SOLN
3.0000 mL | Freq: Two times a day (BID) | RESPIRATORY_TRACT | Status: DC
  Administered 2021-09-01 – 2021-09-04 (×6): 3 mL via RESPIRATORY_TRACT
  Filled 2021-09-01 (×6): qty 3

## 2021-09-01 MED ORDER — ASPIRIN EC 81 MG PO TBEC
81.0000 mg | DELAYED_RELEASE_TABLET | Freq: Every day | ORAL | Status: DC
  Administered 2021-09-01 – 2021-09-04 (×4): 81 mg via ORAL
  Filled 2021-09-01 (×4): qty 1

## 2021-09-01 MED ORDER — POTASSIUM CHLORIDE CRYS ER 20 MEQ PO TBCR
40.0000 meq | EXTENDED_RELEASE_TABLET | Freq: Once | ORAL | Status: AC
  Administered 2021-09-01: 40 meq via ORAL
  Filled 2021-09-01: qty 2

## 2021-09-01 MED ORDER — PREDNISONE 20 MG PO TABS
30.0000 mg | ORAL_TABLET | Freq: Every day | ORAL | Status: DC
  Administered 2021-09-02 – 2021-09-04 (×3): 30 mg via ORAL
  Filled 2021-09-01 (×3): qty 1

## 2021-09-01 MED ORDER — ALPRAZOLAM 0.25 MG PO TABS
0.5000 mg | ORAL_TABLET | Freq: Two times a day (BID) | ORAL | Status: DC | PRN
  Administered 2021-09-01 – 2021-09-03 (×5): 0.5 mg via ORAL
  Filled 2021-09-01 (×6): qty 2

## 2021-09-01 NOTE — Progress Notes (Signed)
?  Echocardiogram ?2D Echocardiogram has been performed. ? ?Ronald [name] ?09/01/2021, 11:05 AM ?

## 2021-09-01 NOTE — Progress Notes (Signed)
Pharmacy Electrolyte Replacement ? ?Recent Labs: ? ?Recent Labs  ?  09/01/21 ?0247  ?K 3.6  ?MG 1.8  ?CREATININE 1.16  ? ? ?Low Critical Values (K </= 2.5, Phos </= 1, Mg </= 1) Present: ?None ? ?MD Contacted: N/A ? ?Plan: K replaced by MD. Add Magnesium 2g IV x1 per protocol. Repeat in AM.  ? ?Sloan Leiter, PharmD ?Clinical Pharmacist ?Please refer to [facility] for [place] numbers ?09/01/2021 ? ?

## 2021-09-01 NOTE — Progress Notes (Signed)
1/3 aerobic gpc. Staph epi mec A ?PHARMACY - PHYSICIAN COMMUNICATION ?CRITICAL VALUE ALERT - BLOOD CULTURE IDENTIFICATION (BCID) ? ?Ronald [name] is an 76 y.o. male who presented to [facility] on 08/31/2021 with a chief complaint of acute on chronic respiratory failure d/t COPD exacerbation and possible pneumonia. ? ?Assessment:  blood cultures 1/3 bottles, aerobic, growing Staph epi with MecA resistance. Likely contaminant. On cefepime for pneumonia.  ? ?Name of physician (or Provider) Contacted: Dr. Halford Chessman ? ?Current antibiotics: Cefepime ? ?Changes to prescribed antibiotics recommended:  ?Patient is on recommended antibiotics - No changes needed ? ?No results found for this or any previous visit. ? ? ?Benetta Spar, PharmD, BCPS, BCCP ?Clinical Pharmacist ? ?Please check AMION for all [place] phone numbers ?After 10:00 PM, call [place] [phone] ? ?

## 2021-09-01 NOTE — Progress Notes (Signed)
eLink Physician-Brief Progress Note ?Patient Name: Ronald [name] ?DOB: [birth year] ?MRN: [id number] ? ? ?Date of Service ? 09/01/2021  ?HPI/Events of Note ? Severe Anxiety - Nursing request to increase Xanax to home dose/schedule.   ?eICU Interventions ? Plan: ?Change Xanax to 0.5 mg PO BID PRN anxiety.   ? ? ? ?Intervention Category ?Major Interventions: Delirium, psychosis, severe agitation - evaluation and management ? ?Jozlin Bently Cornelia Copa ?09/01/2021, 8:52 PM ?

## 2021-09-01 NOTE — Consult Note (Signed)
? ?CONSULTATION NOTE  ? ?Patient Name: Ronald [name] ?Date of Encounter: 09/01/2021 ?Cardiologist: Carlyle Dolly, MD ?Electrophysiologist: None ?Advanced Heart Failure: None ? ? ?Chief Complaint  ? ?Shortness of breath, chest pain ? ?Patient Profile  ? ?76 year old male with history of coronary artery disease and prior DES to the LAD for NSTEMI, hypertension, dyslipidemia, severe COPD on oxygen, prior stroke, type 2 diabetes and tobacco abuse, who presents with progressive hypoxia, chest tightness and diaphoresis thought to have pneumonia versus CHF exacerbation. ? ?HPI  ? ?Ronald [name] is a 76 y.o. male who is being seen today for the evaluation of CHF at the request of Dr. Halford Chessman.  This is a 76 year old male followed by cardiology, last seen in September 2022 with a history of coronary artery disease status post DES to the LAD x2 for NSTEMI in 2015, hypertension, dyslipidemia, severe COPD on oxygen, prior stroke, ulcerative colitis, GERD type 2 diabetes and tobacco abuse.  He has no history of congestive heart failure although last echo showed an LVEF of 50 to 55% in November 2022 without any significant valvular heart disease.  Atrial flutter was noted during the study.  He was seen in consultation in February 2023 for elevated troponin and had emergent cardioversion in the ER for A-fib/flutter with rapid ventricular response.  He has been on amiodarone and Eliquis.  Cath in 2021 showed no obstructive coronary disease with patent stents.  He was diuresed in February and weight at discharge was 77 kg.  Admission weight now is 82.8 kg.  Chest x-ray yesterday shows interstitial and airspace opacities of the right mid and lower lung base consistent with multifocal pneumonia.  BNP was minimally elevated at 296.5 which was slightly higher than to 289.9 (11 months ago).  Initial troponin elevated to 48 with repeat at 1768. He reports he has had recent chest pain. EKG shows inferolateral ST depressions ? ?PMHx  ? ?Past  Medical History:  ?Diagnosis Date  ? Anxiety   ? Asthma   ? Atrial flutter ([place]) 04/2021  ? Chronic lower back pain   ? COPD (chronic obstructive pulmonary disease) ([place])   ? Coronary artery disease   ? a. NSTEMI 05/2014 s/p DESx2 to LAD at [facility].  ? Depression   ? Educated about COVID-19 virus infection 03/06/2020  ? GERD (gastroesophageal reflux disease)   ? High cholesterol   ? Hypertension   ? NSTEMI (non-ST elevated myocardial infarction) ([place]) 05/2014  ? with stent placement  ? Sleep apnea   ? Stroke [facility]) 2017  ? anyeusym   ? TIA (transient ischemic attack)   ? "they say I've had some mini strokes; don't know when"; denies residual on 06/22/2014)  ? Type II diabetes mellitus ([place])   ? Ulcerative colitis ([place])   ? ? ?Past Surgical History:  ?Procedure Laterality Date  ? APPENDECTOMY    ? BIOPSY  07/20/2020  ? Procedure: BIOPSY;  Surgeon: Montez Morita, Quillian Quince, MD;  [facility];  Service: Gastroenterology;;  ? CARDIAC CATHETERIZATION  1990's X 3  ? CHOLECYSTECTOMY    ? COLONOSCOPY WITH PROPOFOL N/A 07/20/2020  ? Procedure: COLONOSCOPY WITH PROPOFOL;  Surgeon: Harvel Quale, MD;  Location: [facility] END[facility];  Service: Gastroenterology;  Laterality: N/A;  1:15  ? CORONARY ANGIOPLASTY WITH STENT PLACEMENT  05/2014  ? "2"  ? ESOPHAGEAL DILATION N/A 07/20/2020  ? Procedure: ESOPHAGEAL DILATION;  Surgeon: Montez Morita, Quillian Quince, MD;  Location: [facility] END[facility];  Service: Gastroenterology;  Laterality: N/A;  ?  ESOPHAGOGASTRODUODENOSCOPY (EGD) WITH PROPOFOL N/A 07/20/2020  ? Procedure: ESOPHAGOGASTRODUODENOSCOPY (EGD) WITH PROPOFOL;  Surgeon: Harvel Quale, MD;  Location[facility] END[facility];  Service: Gastroenterology;  Laterality: N/A;  ? IR GASTROSTOMY TUBE MOD SED  06/04/2021  ? IR GASTROSTOMY TUBE REMOVAL  07/24/2021  ? LEFT HEART CATH AND CORONARY ANGIOGRAPHY N/A 05/25/2020  ? Procedure: LEFT HEART CATH AND CORONARY ANGIOGRAPHY;  Surgeon: Martinique, Peter M, MD;  Location: [place] CV  LAB;  Service: Cardiovascular;  Laterality: N/A;  ? POLYPECTOMY  07/20/2020  ? Procedure: POLYPECTOMY INTESTINAL;  Surgeon: Montez Morita, Quillian Quince, MD;  Location: [facility] END[facility];  Service: Gastroenterology;;  ? TUMOR EXCISION Right ~ 1999  ? "side of my upper head"  ? ? ?FAMHx  ? ?Family History  ?Problem Relation Age of Onset  ? CAD Father   ? Lung cancer Brother   ?     smoked  ? Cancer Brother   ?     lung  ? Leukemia Sister   ? Dementia Sister   ? Stroke Mother   ? Emphysema Sister   ? Cancer Brother   ?     lung  ? ? ?SOCHx  ? ? reports that he quit smoking about 1 months ago. His smoking use included cigarettes. He started smoking about 55 years ago. He has a 72.00 pack-year smoking history. He has never used smokeless tobacco. He reports that he does not currently use alcohol. He reports that he does not use drugs. ? ?Outpatient Medications  ? ?No current facility-administered medications on file prior to encounter.  ? ?Current Outpatient Medications on File Prior to Encounter  ?Medication Sig Dispense Refill  ? albuterol (VENTOLIN HFA) 108 (90 Base) MCG/ACT inhaler INHALE 2 PUFFS EVERY 6 HOURS AS NEEEDED (Patient taking differently: Inhale 2 puffs into the lungs every 6 (six) hours as needed for wheezing or shortness of breath.) 6.7 g 0  ? ALPRAZolam (XANAX) 0.5 MG tablet Take 1 tablet (0.5 mg total) by mouth 2 (two) times daily as needed. for anxiety 60 tablet 2  ? amiodarone (PACERONE) 200 MG tablet Take 1 tablet (200 mg total) by mouth daily. 30 tablet 2  ? atorvastatin (LIPITOR) 40 MG tablet TAKE 1 TABLET BY MOUTH ONCE DAILY IN THE EVENING (Patient taking differently: Take 40 mg by mouth every evening.) 90 tablet 0  ? budesonide (PULMICORT) 0.5 MG/2ML nebulizer solution Take 2 mLs (0.5 mg total) by nebulization 2 (two) times daily. 100 mL 12  ? ELIQUIS 5 MG TABS tablet Take 1 tablet (5 mg total) by mouth 2 (two) times daily. 60 tablet 2  ? furosemide (LASIX) 40 MG tablet Take 1 tablet (40 mg total) by  mouth daily. 90 tablet 1  ? ipratropium-albuterol (DUONEB) 0.5-2.5 (3) MG/3ML SOLN Take 3 mLs by nebulization every 6 (six) hours as needed (asthma).    ? isosorbide mononitrate (ISMO) 10 MG tablet Take 1 tablet (10 mg total) by mouth 2 (two) times daily. 30 tablet 5  ? losartan (COZAAR) 100 MG tablet Take 1 tablet (100 mg total) by mouth daily. 90 tablet 1  ? metoprolol tartrate (LOPRESSOR) 25 MG tablet Take 1 tablet (25 mg total) by mouth 2 (two) times daily. 60 tablet 2  ? nitroGLYCERIN (NITROSTAT) 0.4 MG SL tablet Place 0.4 mg under the tongue every 5 (five) minutes as needed for chest pain.    ? omeprazole (PRILOSEC) 20 MG capsule Take 20 mg by mouth daily.    ? OXYGEN Inhale 3-4  L into the lungs daily.    ? blood glucose meter kit and supplies Dispense based on patient and insurance preference. Use up to four times daily as directed. (FOR ICD-10 E10.9, E11.9). 1 each 0  ? Blood Glucose Monitoring Suppl (ACCU-CHEK GUIDE ME) w/Device KIT Check BS daily Dx E11.9 1 kit 0  ? glucose blood (ONE TOUCH ULTRA TEST) test strip USE TO CHECK GLUCOSE ONCE DAILY 100 each 3  ? ? ?Inpatient Medications  ?  ?Scheduled Meds: ? amiodarone  200 mg Oral Daily  ? apixaban  5 mg Oral BID  ? aspirin EC  81 mg Oral Daily  ? atorvastatin  40 mg Oral QPM  ? chlorhexidine  15 mL Mouth Rinse BID  ? Chlorhexidine Gluconate Cloth  6 each Topical Q0600  ? furosemide  40 mg Intravenous Q12H  ? insulin aspart  0-20 Units Subcutaneous TID WC  ? insulin aspart  0-5 Units Subcutaneous QHS  ? ipratropium-albuterol  3 mL Nebulization TID  ? isosorbide mononitrate  10 mg Oral BID  ? losartan  100 mg Oral Daily  ? mouth rinse  15 mL Mouth Rinse q12n4p  ? methylPREDNISolone (SOLU-MEDROL) injection  80 mg Intravenous Q24H  ? metoprolol tartrate  25 mg Oral BID  ? pantoprazole  40 mg Oral Q1200  ? potassium chloride  40 mEq Oral Once  ? [START ON 09/02/2021] predniSONE  30 mg Oral Q breakfast  ? ? ?Continuous Infusions: ? sodium chloride Stopped  (09/01/21 0649)  ? ceFEPime (MAXIPIME) IV 200 mL/hr at 09/01/21 0800  ? ? ?PRN Meds: ?acetaminophen, albuterol, ALPRAZolam  ? ?ALLERGIES  ? ?Allergies  ?Allergen Reactions  ? Gabapentin Anxiety  ?  Unknown reac

## 2021-09-01 NOTE — Progress Notes (Signed)
[facility] ADULT ICU REPLACEMENT PROTOCOL ? ? ?The patient does apply for the [facility] Adult ICU Electrolyte Replacment Protocol based on the criteria listed below:  ? ?1.Exclusion criteria: TCTS patients, ECMO patients, and Dialysis patients ?2. Is GFR >/= 30 ml/min? Yes.    ?Patient's GFR today is >60 ?3. Is SCr </= 2? No. ?Patient's SCr is 1.16 mg/dL ?4. Did SCr increase >/= 0.5 in 24 hours? No. ?5.Pt's weight >40kg  Yes.   ?6. Abnormal electrolyte(s): K+3.6  ?7. Electrolytes replaced per protocol ?8.  Call MD STAT for K+ </= 2.5, Phos </= 1, or Mag </= 1 ?Physician:  sommer ? ? ?Carlisle Beers 09/01/2021 5:20 AM  ?

## 2021-09-01 NOTE — Progress Notes (Signed)
ANTICOAGULATION CONSULT NOTE - Initial Consult ? ?Pharmacy Consult for IV Heparin ?Indication: chest pain/ACS ? ?Allergies  ?Allergen Reactions  ? Gabapentin Anxiety  ?  Unknown reaction  ? Metformin And Related Rash  ? ? ?Patient Measurements: ?Height: '5\' 9"'$  (175.3 cm) ?Weight: 82.8 kg (182 lb 8.7 oz) ?IBW/kg (Calculated) : 70.7 ?Heparin Dosing Weight: 81.5 kg ? ?Vital Signs: ?Temp: 97.7 ?F (36.5 ?C) (03/26 0736) ?Temp Source: Axillary (03/26 0736) ?BP: 180/73 (03/26 0953) ?Pulse Rate: 105 (03/26 0953) ? ?Labs: ?Recent Labs  ?  08/31/21 ?1345 08/31/21 ?1442 08/31/21 ?1923 09/01/21 ?0247  ?HGB 12.3*  --   --  10.7*  ?HCT 39.7  --   --  33.5*  ?PLT 506*  --   --  412*  ?APTT  --  30  --   --   ?LABPROT  --  17.4*  --   --   ?INR  --  1.4*  --   --   ?CREATININE 1.19  --   --  1.16  ?TROPONINIHS 248*  --  1,768*  --   ? ? ?Estimated Creatinine Clearance: 56.7 mL/min (by C-G formula based on SCr of 1.16 mg/dL). ? ? ?Medical History: ?Past Medical History:  ?Diagnosis Date  ? Anxiety   ? Asthma   ? Atrial flutter ([place]) 04/2021  ? Chronic lower back pain   ? COPD (chronic obstructive pulmonary disease) ([place])   ? Coronary artery disease   ? a. NSTEMI 05/2014 s/p DESx2 to LAD at [facility].  ? Depression   ? Educated about COVID-19 virus infection 03/06/2020  ? GERD (gastroesophageal reflux disease)   ? High cholesterol   ? Hypertension   ? NSTEMI (non-ST elevated myocardial infarction) ([place]) 05/2014  ? with stent placement  ? Sleep apnea   ? Stroke [facility]) 2017  ? anyeusym   ? TIA (transient ischemic attack)   ? "they say I've had some mini strokes; don't know when"; denies residual on 06/22/2014)  ? Type II diabetes mellitus ([place])   ? Ulcerative colitis ([place])   ? ? ?Assessment: ?76 years of age male on Eliquis prior to admission for Aflutter and piror history of stroke (no acute)- last dose 3/25. Pharmacy consulted to start IV Heparin therapy for ACS and possible catheterization next week.  ? ?Eliquis PTA - last dose 3/26 at  09:41 AM ?Will start IV heparin 12 hours post this dose.  ?Hgb down some at 10.7 - no bleeding noted.  ?Platelets are elevated- stable.  ? ?Goal of Therapy:  ?Heparin level 0.3-0.7 units/ml ?Monitor platelets by anticoagulation protocol: Yes ?  ?Plan:  ?At 2200 PM- Start IV Heparin at 1300 units/hr. ?APTT and Heparin level ~6-8 hours post initiation.  ?Monitor for signs and symptoms of bleeding.  ? ?Sloan Leiter, PharmD ?Clinical Pharmacist ?Please refer to [facility] for [place] numbers ?09/01/2021,10:37 AM ? ? ?

## 2021-09-01 NOTE — H&P (Signed)
? ?NAME:  Ronald [name], MRN:  [id number], DOB:  [birth year], LOS: 1 ?ADMISSION DATE:  08/31/2021, CONSULTATION DATE: 3/25 ?REFERRING MD: Dr. Roderic Palau, CHIEF COMPLAINT: SOB ? ?History of Present Illness:  ?76 yo male former smoker with severe COPD on 3 liters home oxygen and diastolic CHF presented to [facility] with progressive dyspnea with hypoxia, chest tightness, and diaphoresis.  Started on Bipap.  Found to have asymmetric pulmonary infiltrate concerning for pneumonia versus CHF.  Transferred to [facility] for further management.  Has required intubation twice in the past 6 months for similar events. ? ?Pertinent  Medical History  ?DM 2, HTN, HLD, CAD s/p MI, s/p DES x2 to LAD 2015, HFpEF, Paroxysmal atrial fibrillation on eliquis, Chronic hypoxic respiratory failure on 3 liters, Tracheostomy with tracheal stenosis 2018, COPD, GERD, Anxiety, Lymphedema of arms ? ?Significant Hospital Events: ?Including procedures, antibiotic start and stop dates in addition to other pertinent events   ?3/25 Admit with SOB, cough. Concern for possible PNA vs CHF exacerbation.  ? ?Interim History / Subjective:  ?Breathing better.  Not having chest pain.  Didn't need Bipap overnight. ? ?Objective   ?Blood pressure (!) 152/56, pulse 77, temperature 97.7 ?F (36.5 ?C), temperature source Axillary, resp. rate (!) 25, height '5\' 9"'$  (1.753 m), weight 82.8 kg, SpO2 99 %. ?   ?   ? ?Intake/Output Summary (Last 24 hours) at 09/01/2021 0755 ?Last data filed at 09/01/2021 0700 ?Gross per 24 hour  ?Intake 1452.68 ml  ?Output 1400 ml  ?Net 52.68 ml  ? ?Filed Weights  ? 08/31/21 1334 09/01/21 0500  ?Weight: 81.5 kg 82.8 kg  ? ? ?Examination: ? ?General - alert ?Eyes - pupils reactive ?ENT - no sinus tenderness, no stridor ?Cardiac - irregular ?Chest - better air movement, no wheeze ?Abdomen - soft, non tender, + bowel sounds ?Extremities - lymphedema of arms, ?Skin - no rashes ?Neuro - normal strength, moves extremities, follows commands ?Psych - normal mood and  behavior ? ? ?Resolved Hospital Problem list   ?  ? ?Assessment & Plan:  ? ?Acute on chronic hypoxic/hypercapnic respiratory failure. ?- from COPD exacerbation, acute pulmonary edema, and community acquired multifocal bacterial pneumonia (POA) ?- goal SpO2 90 to 95% ?- Abx day 2; will d/c vancomycin and continue cefepime ?- f/u CXR intermittently ? ?COPD exacerbation with severe COPD from emphysema. ?- change to pulmicort, brovana, yupelri ?- prn albuterol ?- continue solumedrol on 3/26; change to prednisone 30 mg daily on 3/27 and wean off as tolerated ? ?Elevated troponin. ?Acute on chronic diastolic CHF with acute pulmonary edema. ?Hx of CAD s/p DES, PAF, HTN, HLD. ?- elevated troponin could be demand ischemia in setting of severe hypoxia, but he has history of CAD ?- f/u Echo ?- continue lasix, eliquis, amiodarone, lipitor, isosorbide, cozaar, lopressor, aspirin ?- goal BP normotensive ?- followed by Dr. Carlyle Dolly as outpt; consult cardiology ? ?Rt mid lung field spiculated nodular density. ?- seen on CT chest from 07/10/21 ?- associated 2.4 cm subcarinal adenopathy ?- if he remains stable, then plan to get CT chest follow up on 3/27 ? ?Anxiety. ?- likely develops dynamic hyperinflation with anxiety ?- prn xanax ? ?Hypokalemia. ?- f/u BMET ? ?Hx of GERD. ?- continue protonix ? ?DM type 2 poorly controlled with steroid induced hyperglycemia. ?- SSI ? ?Best Practice (right click and "Reselect all SmartList Selections" daily)  ? ?Diet/type: Regular consistency (see orders) ?DVT prophylaxis: DOAC ?GI prophylaxis: PPI ?Lines: N/A ?Foley:  N/A ?Code Status:  full code ?  Last date of multidisciplinary goals of care discussion '[x]'$  updated pt's family at bedside ? ?Labs   ? ? ?  Latest Ref Rng & Units 09/01/2021  ?  2:47 AM 08/31/2021  ?  1:45 PM 08/14/2021  ?  3:31 PM  ?CMP  ?Glucose 70 - 99 mg/dL 241   228   146    ?BUN 8 - 23 mg/dL '17   15   10    '$ ?Creatinine 0.61 - 1.24 mg/dL 1.16   1.19   0.94    ?Sodium 135 - 145  mmol/L 137   138   142    ?Potassium 3.5 - 5.1 mmol/L 3.6   3.0   3.9    ?Chloride 98 - 111 mmol/L 98   97   98    ?CO2 22 - 32 mmol/L 28   31   33    ?Calcium 8.9 - 10.3 mg/dL 8.3   8.5   9.2    ?Total Protein 6.5 - 8.1 g/dL  6.8   6.4    ?Total Bilirubin 0.3 - 1.2 mg/dL  0.5   0.4    ?Alkaline Phos 38 - 126 U/L  80   105    ?AST 15 - 41 U/L  16   11    ?ALT 0 - 44 U/L  10   7    ? ? ? ?  Latest Ref Rng & Units 09/01/2021  ?  2:47 AM 08/31/2021  ?  1:45 PM 07/24/2021  ?  4:00 AM  ?CBC  ?WBC 4.0 - 10.5 K/uL 7.1   16.9   10.5    ?Hemoglobin 13.0 - 17.0 g/dL 10.7   12.3   10.1    ?Hematocrit 39.0 - 52.0 % 33.5   39.7   30.3    ?Platelets 150 - 400 K/uL 412   506   326    ? ? ?ABG ?   ?Component Value Date/Time  ? PHART 7.38 08/31/2021 1440  ? PCO2ART 57 (H) 08/31/2021 1440  ? PO2ART 80 (L) 08/31/2021 1440  ? HCO3 33.7 (H) 08/31/2021 1440  ? TCO2 27 07/19/2021 1539  ? ACIDBASEDEF 4.0 (H) 07/10/2021 2013  ? O2SAT 96.6 08/31/2021 1440  ? ? ?CBG (last 3)  ?Recent Labs  ?  08/31/21 ?1742 08/31/21 ?2124 09/01/21 ?0736  ?GLUCAP 218* 304* 239*  ? ? ?Signature:  ?Chesley Mires, MD ?[place] ?Pager - [phone] - [phone] ?09/01/2021, 7:55 AM ? ? ? ? ? ? ?

## 2021-09-02 ENCOUNTER — Ambulatory Visit: Payer: Medicare HMO | Admitting: Cardiology

## 2021-09-02 ENCOUNTER — Telehealth: Payer: Self-pay | Admitting: Internal Medicine

## 2021-09-02 DIAGNOSIS — J9601 Acute respiratory failure with hypoxia: Secondary | ICD-10-CM

## 2021-09-02 DIAGNOSIS — Z7189 Other specified counseling: Secondary | ICD-10-CM

## 2021-09-02 DIAGNOSIS — J441 Chronic obstructive pulmonary disease with (acute) exacerbation: Secondary | ICD-10-CM | POA: Diagnosis not present

## 2021-09-02 DIAGNOSIS — Z515 Encounter for palliative care: Secondary | ICD-10-CM | POA: Diagnosis not present

## 2021-09-02 LAB — BASIC METABOLIC PANEL
Anion gap: 5 (ref 5–15)
BUN: 25 mg/dL — ABNORMAL HIGH (ref 8–23)
CO2: 30 mmol/L (ref 22–32)
Calcium: 8.4 mg/dL — ABNORMAL LOW (ref 8.9–10.3)
Chloride: 100 mmol/L (ref 98–111)
Creatinine, Ser: 1.21 mg/dL (ref 0.61–1.24)
GFR, Estimated: >60 mL/min (ref >60)
Glucose, Bld: 258 mg/dL — ABNORMAL HIGH (ref 70–99)
Potassium: 4 mmol/L (ref 3.5–5.1)
Sodium: 135 mmol/L (ref 135–145)

## 2021-09-02 LAB — CBC
HCT: 33.8 % — ABNORMAL LOW (ref 39.0–52.0)
Hemoglobin: 11 g/dL — ABNORMAL LOW (ref 13.0–17.0)
MCH: 27.2 pg (ref 26.0–34.0)
MCHC: 32.5 g/dL (ref 30.0–36.0)
MCV: 83.5 fL (ref 80.0–100.0)
Platelets: 414 10*3/uL — ABNORMAL HIGH (ref 150–400)
RBC: 4.05 MIL/uL — ABNORMAL LOW (ref 4.22–5.81)
RDW: 13.5 % (ref 11.5–15.5)
WBC: 13.3 10*3/uL — ABNORMAL HIGH (ref 4.0–10.5)
nRBC: 0 % (ref 0.0–0.2)

## 2021-09-02 LAB — APTT
aPTT: 33 seconds (ref 24–36)
aPTT: 50 seconds — ABNORMAL HIGH (ref 24–36)
aPTT: 49 seconds — ABNORMAL HIGH (ref 24–36)

## 2021-09-02 LAB — MAGNESIUM: Magnesium: 2.1 mg/dL (ref 1.7–2.4)

## 2021-09-02 LAB — GLUCOSE, CAPILLARY
Glucose-Capillary: 264 mg/dL — ABNORMAL HIGH (ref 70–99)
Glucose-Capillary: 258 mg/dL — ABNORMAL HIGH (ref 70–99)
Glucose-Capillary: 230 mg/dL — ABNORMAL HIGH (ref 70–99)
Glucose-Capillary: 262 mg/dL — ABNORMAL HIGH (ref 70–99)

## 2021-09-02 LAB — HEPARIN LEVEL (UNFRACTIONATED): Heparin Unfractionated: >1.1 IU/mL — ABNORMAL HIGH (ref 0.30–0.70)

## 2021-09-02 MED ORDER — HYDRALAZINE HCL 20 MG/ML IJ SOLN
10.0000 mg | Freq: Four times a day (QID) | INTRAMUSCULAR | Status: DC | PRN
  Administered 2021-09-02: 10 mg via INTRAVENOUS
  Filled 2021-09-02 (×3): qty 1

## 2021-09-02 MED ORDER — INSULIN GLARGINE-YFGN 100 UNIT/ML ~~LOC~~ SOLN
10.0000 [IU] | Freq: Every day | SUBCUTANEOUS | Status: DC
  Administered 2021-09-02 – 2021-09-04 (×3): 10 [IU] via SUBCUTANEOUS
  Filled 2021-09-02 (×3): qty 0.1

## 2021-09-02 MED ORDER — DOXYCYCLINE HYCLATE 100 MG PO TABS: 100.0000 mg | ORAL_TABLET | Freq: Two times a day (BID) | ORAL | Status: DC

## 2021-09-02 MED ORDER — AMOXICILLIN-POT CLAVULANATE 875-125 MG PO TABS
1.0000 | ORAL_TABLET | Freq: Two times a day (BID) | ORAL | Status: DC
  Administered 2021-09-02 – 2021-09-04 (×5): 1 via ORAL
  Filled 2021-09-02 (×6): qty 1

## 2021-09-02 MED ORDER — FUROSEMIDE 10 MG/ML IJ SOLN
80.0000 mg | Freq: Every day | INTRAMUSCULAR | Status: DC
  Administered 2021-09-03: 80 mg via INTRAVENOUS
  Filled 2021-09-02: qty 8
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
## 2021-09-02 NOTE — Plan of Care (Signed)
?  Interdisciplinary Goals of Care Family Meeting ? ? ?Date carried out:: 09/02/2021 ? ?Location of the meeting: Bedside ? ?Member's involved: Patient, Nurse Practitioner and Bedside Registered Nurse ? ?Durable Power of Tour manager: Patient    ? ?Discussion: Given extensive medical history including severe COPD with multiple admissions requiring intubation I began the discussion of [place] with Mr. Ronald [name].  ? ?He states that he did not wish to discuss this topic in detail as he has lost several close family members lately and is unable to focus on the topic at had. Therefore we did not discuss his current chronic conditions in detail. I did however encourage him to discuss code status with me and he agreed. He stated that at this time he would want all agressive measures taken if he were to cardiac or respiratory arrest including intubation. ?   ?Patient would benefit from Palliative care consult, order written  ? ?Code status: Full Code ? ?Disposition: Continue current acute care ? ? ?Time spent for the meeting: 25 ? ? ?Halei Hanover D. Harris, NP-C ?[facility] ?Personal contact information can be found on Amion  ?09/02/2021, 8:51 AM ? ? ?

## 2021-09-02 NOTE — Progress Notes (Addendum)
ANTICOAGULATION CONSULT NOTE ? ?Pharmacy Consult for IV Heparin ?Indication: chest pain/ACS ? ?Allergies  ?Allergen Reactions  ? Gabapentin Anxiety  ?  Unknown reaction  ? Metformin And Related Rash  ? ? ?Patient Measurements: ?Height: '5\' 9"'$  (175.3 cm) ?Weight: 82.8 kg (182 lb 8.7 oz) ?IBW/kg (Calculated) : 70.7 ?Heparin Dosing Weight: 81.5 kg ? ?Vital Signs: ?Temp: 97.8 ?F (36.6 ?C) (03/27 0758) ?Temp Source: Axillary (03/27 0758) ?BP: 160/75 (03/27 0500) ?Pulse Rate: 71 (03/27 0500) ? ?Labs: ?Recent Labs  ?  08/31/21 ?1345 08/31/21 ?1442 08/31/21 ?1802 08/31/21 ?1923 09/01/21 ?0247 09/01/21 ?1104 09/01/21 ?1327 09/02/21 ?0623 09/02/21 ?7628  ?HGB 12.3*  --   --   --  10.7*  --   --   --   --   ?HCT 39.7  --   --   --  33.5*  --   --   --   --   ?PLT 506*  --   --   --  412*  --   --   --   --   ?APTT  --  30  --   --   --   --   --  33 50*  ?LABPROT  --  17.4*  --   --   --   --   --   --   --   ?INR  --  1.4*  --   --   --   --   --   --   --   ?HEPARINUNFRC  --   --   --   --   --   --   --  >1.10*  --   ?CREATININE 1.19  --   --   --  1.16  --   --  1.21  --   ?TROPONINIHS 248*  --    < > 1,768*  --  2,161* 2,110*  --   --   ? < > = values in this interval not displayed.  ? ? ? ?Estimated Creatinine Clearance: 54.4 mL/min (by C-G formula based on SCr of 1.21 mg/dL). ? ? ?Medical History: ?Past Medical History:  ?Diagnosis Date  ? Anxiety   ? Asthma   ? Atrial flutter ([place]) 04/2021  ? Chronic lower back pain   ? COPD (chronic obstructive pulmonary disease) ([place])   ? Coronary artery disease   ? a. NSTEMI 05/2014 s/p DESx2 to LAD at [facility]  ? Depression   ? Educated about COVID-19 virus infection 03/06/2020  ? GERD (gastroesophageal reflux disease)   ? High cholesterol   ? Hypertension   ? NSTEMI (non-ST elevated myocardial infarction) ([place]) 05/2014  ? with stent placement  ? Sleep apnea   ? Stroke [facility]) 2017  ? anyeusym   ? TIA (transient ischemic attack)   ? "they say I've had some mini strokes; don't know  when"; denies residual on 06/22/2014)  ? Type II diabetes mellitus ([place])   ? Ulcerative colitis ([place])   ? ? ?Assessment: ?76 years of age male on apixaban prior to admission for Aflutter and piror history of stroke (no acute)- last dose 3/25. Pharmacy consulted to start IV heparin therapy for ACS and possible catheterization next week. Apixaban held - last dose 3/26 AM. Monitoring using aPTT while apixaban expected to influence heparin levels. ? ?aPTT remains subtherapeutic, increased to 50. Heparin level remains elevated, as expected. CBC still pending for today. Yesterday - hgb down some at 10.7 - no bleeding noted. Platelets are elevated-  stable.  ? ?Goal of Therapy:  ?Heparin level 0.3-0.7 units/ml ?Monitor platelets by anticoagulation protocol: Yes ?  ?Plan:  ?Increase heparin infusion to 1450 units/hr. ?Check 8hr aPTT ?Monitor daily heparin level and aPTT until correlating, CBC, s/sx bleeding ?F/u Cardiology plans ? ? ?Arturo Morton, PharmD, BCPS ?Please check AMION for all [place] contact numbers ?Clinical Pharmacist ?09/02/2021 9:00 AM ?

## 2021-09-02 NOTE — Progress Notes (Signed)
? ?Progress Note ? ?Patient Name: Ronald [name] ?Date of Encounter: 09/02/2021 ? ?[facility] Cardiologist: Carlyle Dolly, MD  ? ?Subjective  ? ?Denies CP  Breathing is fair   Wants to go outside  ? ?Inpatient Medications  ?  ?Scheduled Meds: ? amiodarone  200 mg Oral Daily  ? aspirin EC  81 mg Oral Daily  ? atorvastatin  40 mg Oral QPM  ? chlorhexidine  15 mL Mouth Rinse BID  ? Chlorhexidine Gluconate Cloth  6 each Topical Q0600  ? furosemide  40 mg Intravenous Daily  ? insulin aspart  0-20 Units Subcutaneous TID WC  ? insulin aspart  0-5 Units Subcutaneous QHS  ? ipratropium-albuterol  3 mL Nebulization BID  ? isosorbide mononitrate  10 mg Oral BID  ? losartan  100 mg Oral Daily  ? mouth rinse  15 mL Mouth Rinse q12n4p  ? metoprolol tartrate  25 mg Oral BID  ? pantoprazole  40 mg Oral Q1200  ? potassium chloride  40 mEq Oral Once  ? predniSONE  30 mg Oral Q breakfast  ? ?Continuous Infusions: ? sodium chloride 10 mL/hr at 09/02/21 0600  ? ceFEPime (MAXIPIME) IV 200 mL/hr at 09/02/21 0600  ? heparin 1,300 Units/hr (09/02/21 0600)  ? ?PRN Meds: ?acetaminophen, albuterol, ALPRAZolam  ? ?Vital Signs  ?  ?Vitals:  ? 09/02/21 0300 09/02/21 0400 09/02/21 0500 09/02/21 0758  ?BP: (!) 183/60 (!) 160/83 (!) 160/75   ?Pulse: 76 76 71   ?Resp: (!) 33 (!) 25 (!) 28   ?Temp:      ?TempSrc:      ?SpO2: 97% 97% 93% 98%  ?Weight:      ?Height:      ? ? ?Intake/Output Summary (Last 24 hours) at 09/02/2021 0825 ?Last data filed at 09/02/2021 0600 ?Gross per 24 hour  ?Intake 1630.56 ml  ?Output 1400 ml  ?Net 230.56 ml  ? ? ?  09/01/2021  ?  5:00 AM 08/31/2021  ?  1:34 PM 08/14/2021  ?  2:30 PM  ?Last 3 Weights  ?Weight (lbs) 182 lb 8.7 oz 179 lb 9.6 oz 179 lb 9.6 oz  ?Weight (kg) 82.8 kg 81.466 kg 81.466 kg  ?   ? ?Telemetry  ?  ?SR  - Personally Reviewed ? ?ECG  ? ?SR   LVH with repol abnormality , cannot exclude ischemia  - Personally Reviewed ? ?Physical Exam  ? ?GEN: No acute distress.   ?Neck: No JVD ?Cardiac: RRR  No murmurs   ?Respiratory: Airflow is down   Mild wheeze  ?GI: Soft, nontender, non-distended  ?MS: No edema; No deformity. ?Neuro:  Nonfocal  ?Psych: Normal affect  ? ?Labs  ?  ?High Sensitivity Troponin:   ?Recent Labs  ?Lab 08/31/21 ?1345 08/31/21 ?1802 08/31/21 ?1923 09/01/21 ?1104 09/01/21 ?1327  ?TROPONINIHS 248* 1,351* 1,768* 2,161* 2,110*  ?   ?Chemistry ?Recent Labs  ?Lab 08/31/21 ?1345 09/01/21 ?0247 09/02/21 ?1914  ?NA 138 137 135  ?K 3.0* 3.6 4.0  ?CL 97* 98 100  ?CO2 '31 28 30  '$ ?GLUCOSE 228* 241* 258*  ?BUN 15 17 25*  ?CREATININE 1.19 1.16 1.21  ?CALCIUM 8.5* 8.3* 8.4*  ?MG  --  1.8 2.1  ?PROT 6.8  --   --   ?ALBUMIN 3.3*  --   --   ?AST 16  --   --   ?ALT 10  --   --   ?ALKPHOS 80  --   --   ?BILITOT 0.5  --   --   ?  GFRNONAA >60 >60 >60  ?ANIONGAP '10 11 5  '$ ?  ?Lipids No results for input(s): CHOL, TRIG, HDL, LABVLDL, LDLCALC, CHOLHDL in the last 168 hours.  ?Hematology ?Recent Labs  ?Lab 08/31/21 ?1345 09/01/21 ?3614  ?WBC 16.9* 7.1  ?RBC 4.66 4.01*  ?HGB 12.3* 10.7*  ?HCT 39.7 33.5*  ?MCV 85.2 83.5  ?MCH 26.4 26.7  ?MCHC 31.0 31.9  ?RDW 13.3 13.3  ?PLT 506* 412*  ? ?Thyroid No results for input(s): TSH, FREET4 in the last 168 hours.  ?BNP ?Recent Labs  ?Lab 08/31/21 ?1345  ?BNP 250.0*  ?  ?DDimer No results for input(s): DDIMER in the last 168 hours.  ? ?Radiology  ?  ?DG Chest Port 1 View ? ?Result Date: 09/01/2021 ?CLINICAL DATA:  Shortness of breath.  Follow-up exam. EXAM: PORTABLE CHEST 1 VIEW COMPARISON:  08/31/2021 and earlier studies. FINDINGS: Mild interval decrease in interstitial and airspace opacities in the right mid to lower lung. Stable mild airspace opacities at the left lung base. Lungs remain hyperexpanded with relative lucency in the upper lobes consistent with emphysema. No pneumothorax. IMPRESSION: 1. Mild interval improvement in interstitial and airspace opacities in the right mid to lower lung. 2. No other change. Electronically Signed   By: Lajean Manes M.D.   On: 09/01/2021 10:45  ? ?DG  Chest Port 1 View ? ?Result Date: 08/31/2021 ?CLINICAL DATA:  Shortness of breath. EXAM: PORTABLE CHEST 1 VIEW COMPARISON:  07/18/2021 and older exams.  Chest CT, 07/10/2021. FINDINGS: Interstitial and patchy airspace lung opacities are noted in the right mid to lower lung, and at the left lung base, consistent with multifocal pneumonia, similar to the chest radiograph from 07/17/2021. Lungs are hyperexpanded. The 2.5 cm irregular nodular opacity noted in the right upper lobe on the prior chest CT is not visualized, presumably obscured by the lung opacities. No convincing pleural effusion.  No pneumothorax. Cardiac silhouette is normal in size. No mediastinal or hilar masses. Skeletal structures are grossly intact. IMPRESSION: 1. Interstitial and airspace lung opacities right mid to lower lung and left lung base, consistent with multifocal pneumonia, similar appearance to the chest radiograph from 07/17/2021. Underlying COPD/emphysema. 2. Prior CT demonstrated a focal, nodular opacity in the right upper lobe abutting the minor fissure. This is not defined on the portable frontal chest radiograph. Recommend follow-up chest CT after treatment for the current illness, in the next 3-4 weeks, to determine whether this nodule persists or improves/resolves as would be expected for inflammation. Electronically Signed   By: Lajean Manes M.D.   On: 08/31/2021 14:13  ? ?ECHOCARDIOGRAM COMPLETE ? ?Result Date: 09/01/2021 ?   ECHOCARDIOGRAM REPORT   Patient Name:   Ronald [name] Date of Exam: 09/01/2021 Medical Rec #:  [id number]     Height:       69.0 in Accession #:    [id number]    Weight:       182.5 lb Date of Birth:  24-Oct-1945      BSA:          1.987 m? Patient Age:    76 years      BP:           148/72 mmHg Patient Gender: M             HR:           82 bpm. Exam Location:  Inpatient Procedure: 2D Echo Indications:    elevated troponin  History:  Patient has prior history of Echocardiogram examinations, most                  recent 05/07/2021. CAD, COPD; Risk Factors:Sleep Apnea,                 Diabetes, Hypertension, Dyslipidemia and Former Smoker.  Sonographer:    Johny Chess RDCS Referring Phys: [place]  1. Left ventricular ejection fraction, by estimation, is 55 to 60%. The left ventricle has normal function. The left ventricle demonstrates regional wall motion abnormalities (see scoring diagram/findings for description). There is mild left ventricular  hypertrophy. Left ventricular diastolic parameters are consistent with Grade I diastolic dysfunction (impaired relaxation). Elevated left ventricular end-diastolic pressure. The E/e' is 20. There is moderate hypokinesis of the left ventricular, basal inferoseptal wall, inferolateral wall and septal wall.  2. Right ventricular systolic function is hyperdynamic. The right ventricular size is normal.  3. Left atrial size was mildly dilated.  4. The mitral valve is abnormal. Trivial mitral valve regurgitation.  5. The aortic valve is tricuspid. Aortic valve regurgitation is not visualized.  6. The inferior vena cava is normal in size with greater than 50% respiratory variability, suggesting right atrial pressure of 3 mmHg.  7. Cannot exclude a small PFO. Comparison(s): Changes from prior study are noted. 05/07/2021: LVEF 50-55%, no regional wall motion abnormalities. FINDINGS  Left Ventricle: Left ventricular ejection fraction, by estimation, is 55 to 60%. The left ventricle has normal function. The left ventricle demonstrates regional wall motion abnormalities. Moderate hypokinesis of the left ventricular, basal inferoseptal  wall, inferolateral wall and septal wall. The left ventricular internal cavity size was normal in size. There is mild left ventricular hypertrophy. Left ventricular diastolic parameters are consistent with Grade I diastolic dysfunction (impaired relaxation). Elevated left ventricular end-diastolic pressure. The E/e' is 20. Right  Ventricle: The right ventricular size is normal. No increase in right ventricular wall thickness. Right ventricular systolic function is hyperdynamic. Left Atrium: Left atrial size was mildly dilated. Right

## 2021-09-02 NOTE — Telephone Encounter (Signed)
Nodule consult for RB or BI in 2-3 weeks, thank you ?

## 2021-09-02 NOTE — Progress Notes (Signed)
? ?NAME:  Ronald [name], MRN:  [id number], DOB:  January 16, 1946, LOS: 2 ?ADMISSION DATE:  08/31/2021, CONSULTATION DATE: 3/25 ?REFERRING MD: Dr. Roderic Palau, CHIEF COMPLAINT: SOB ? ?History of Present Illness:  ?76 yo male former smoker with severe COPD on 3 liters home oxygen and diastolic CHF presented to [facility] with progressive dyspnea with hypoxia, chest tightness, and diaphoresis.  Started on Bipap.  Found to have asymmetric pulmonary infiltrate concerning for pneumonia versus CHF.  Transferred to [facility] for further management.  Has required intubation twice in the past 6 months for similar events. ? ?Pertinent  Medical History  ?DM 2, HTN, HLD, CAD s/p MI, s/p DES x2 to LAD 2015, HFpEF, Paroxysmal atrial fibrillation on eliquis, Chronic hypoxic respiratory failure on 3 liters, Tracheostomy with tracheal stenosis 2018, COPD, GERD, Anxiety, Lymphedema of arms ? ?Significant Hospital Events: ?Including procedures, antibiotic start and stop dates in addition to other pertinent events   ?3/25 Admit with SOB, cough. Concern for possible PNA vs CHF exacerbation.  ?3/27 Remains on heparin drip ans supplental oxygen  ? ?Interim History / Subjective:  ?States she feels well but report his breathing is not at baseline,  ? ?Objective   ?Blood pressure (!) 160/75, pulse 71, temperature 97.6 ?F (36.4 ?C), temperature source Oral, resp. rate (!) 28, height '5\' 9"'$  (1.753 m), weight 82.8 kg, SpO2 98 %. ?   ?   ? ?Intake/Output Summary (Last 24 hours) at 09/02/2021 0806 ?Last data filed at 09/02/2021 0600 ?Gross per 24 hour  ?Intake 1630.56 ml  ?Output 1400 ml  ?Net 230.56 ml  ? ? ?Filed Weights  ? 08/31/21 1334 09/01/21 0500  ?Weight: 81.5 kg 82.8 kg  ? ? ?Examination: ?General: Acute on chronically ill appearing elderly  male lying in bed, in NAD ?HEENT: [place]/AT, MM pink/moist, PERRL,  ?Neuro: Alert and oriented x3, non-focal  ?CV: s1s2 regular rate and rhythm, no murmur, rubs, or gallops,  ?PULM:  Diminished breath sounds bilaterally, no wheezing,  no increased work of breathing, on 4L [place] ?GI: soft, bowel sounds active in all 4 quadrants, non-tender, non-distended, oral diet ?Extremities: warm/dry, chronic upper extremity edema  ?Skin: no rashes or lesions ? ?Resolved Hospital Problem list   ? Hypokalemia ? ?Assessment & Plan:  ? ?Acute on chronic hypoxic/hypercapnic respiratory failure. ?- from COPD exacerbation, acute pulmonary edema, and community acquired multifocal bacterial pneumonia (POA) ?COPD exacerbation with severe COPD from emphysema ?Daily tobacco use  ?P: ?Wean supplemental oxygen for sats greater than 88%. ?Head of bed elevated 30 degrees ?Follow intermittent chest x-ray and ABG ?Ensure adequate pulmonary hygiene  ?Follow cultures  ?Continue empiric Cefepime  ?Continue Pulmicort, Garlon Hatchet, and Yupelri ?PRN Albuterol  ?Continue PO steroids with slow taper  ?Tobacco cessation education ? ?Elevated troponin. ?Acute on chronic diastolic CHF with acute pulmonary edema. ?Hx of CAD s/p DES, PAF, HTN, HLD. ?- elevated troponin could be demand ischemia in setting of severe hypoxia, but he has history of CAD ?P: ?Cardiology consulted, appreciate assistance  ?Heparin drip per cardiology  ?Hold home Eliquis  ?Continue statin, lasix, amio, ASA, cozaar, lopressor, ?Heart health diet with sodium restriction when able  ?Strict intake and output  ?Daily weight to assess volume status ? ?Rt mid lung field spiculated nodular density ?- seen on CT chest from 07/10/21 ?- associated 2.4 cm subcarinal adenopathy ?P: ?Consider chest CT  ? ?Anxiety ?- likely develops dynamic hyperinflation with anxiety ?P: ?PRN Xanax  ? ?Hx of GERD ?P: ?Continue PPI ? ?DM type 2  poorly controlled with steroid induced hyperglycemia. ?P: ?Continue SSI  ?CBG q4 ?CBG goal 140-180 ? ?Best Practice (right click and "Reselect all SmartList Selections" daily)  ? ?Diet/type: Regular consistency (see orders) ?DVT prophylaxis: DOAC ?GI prophylaxis: PPI ?Lines: N/A ?Foley:  N/A ?Code Status:  full  code ?Last date of multidisciplinary goals of care discussion See separate Plan of Care Note written 09/02/21 ? ?Signature:  ?Znya Albino D. Harris, NP-C ?[facility] ?Personal contact information can be found on Amion  ?09/02/2021, 8:20 AM ? ? ? ? ? ? ? ? ?

## 2021-09-02 NOTE — Progress Notes (Signed)
ANTICOAGULATION CONSULT NOTE ? ?Pharmacy Consult for IV Heparin ?Indication: chest pain/ACS ? ?Allergies  ?Allergen Reactions  ? Gabapentin Anxiety  ?  Unknown reaction  ? Metformin And Related Rash  ? ? ?Patient Measurements: ?Height: '5\' 9"'$  (175.3 cm) ?Weight: 82.8 kg (182 lb 8.7 oz) ?IBW/kg (Calculated) : 70.7 ?Heparin Dosing Weight: 81.5 kg ? ?Vital Signs: ?Temp: 97.8 ?F (36.6 ?C) (03/27 1530) ?Temp Source: Oral (03/27 1530) ?BP: 150/70 (03/27 1800) ?Pulse Rate: 88 (03/27 1800) ? ?Labs: ?Recent Labs  ?   ?0000 08/31/21 ?1345 08/31/21 ?1442 08/31/21 ?1802 08/31/21 ?1923 09/01/21 ?0247 09/01/21 ?1104 09/01/21 ?1327 09/02/21 ?9485 09/02/21 ?4627 09/02/21 ?1834  ?HGB  --  12.3*  --   --   --  10.7*  --   --   --  11.0*  --   ?HCT  --  39.7  --   --   --  33.5*  --   --   --  33.8*  --   ?PLT  --  506*  --   --   --  412*  --   --   --  414*  --   ?APTT   < >  --  30  --   --   --   --   --  33 50* 49*  ?LABPROT  --   --  17.4*  --   --   --   --   --   --   --   --   ?INR  --   --  1.4*  --   --   --   --   --   --   --   --   ?HEPARINUNFRC  --   --   --   --   --   --   --   --  >1.10*  --   --   ?CREATININE  --  1.19  --   --   --  1.16  --   --  1.21  --   --   ?TROPONINIHS  --  248*  --    < > 1,768*  --  2,161* 2,110*  --   --   --   ? < > = values in this interval not displayed.  ? ? ?Estimated Creatinine Clearance: 54.4 mL/min (by C-G formula based on SCr of 1.21 mg/dL). ? ? ?Medical History: ?Past Medical History:  ?Diagnosis Date  ? Anxiety   ? Asthma   ? Atrial flutter ([place]) 04/2021  ? Chronic lower back pain   ? COPD (chronic obstructive pulmonary disease) ([place])   ? Coronary artery disease   ? a. NSTEMI 05/2014 s/p DESx2 to LAD at [facility].  ? Depression   ? Educated about COVID-19 virus infection 03/06/2020  ? GERD (gastroesophageal reflux disease)   ? High cholesterol   ? Hypertension   ? NSTEMI (non-ST elevated myocardial infarction) ([place]) 05/2014  ? with stent placement  ? Sleep apnea   ? Stroke [facility])  2017  ? anyeusym   ? TIA (transient ischemic attack)   ? "they say I've had some mini strokes; don't know when"; denies residual on 06/22/2014)  ? Type II diabetes mellitus ([place])   ? Ulcerative colitis ([place])   ? ? ?Assessment: ?76 years of age male on apixaban prior to admission for Aflutter and piror history of stroke (no acute)- last dose 3/25. Pharmacy consulted to start IV heparin therapy for ACS and possible catheterization next  week. Apixaban held - last dose 3/26 AM. Monitoring using aPTT while apixaban expected to influence heparin levels. ? ?aPTT 49 remains subtherapeutic despite rate increase to 1450 units/hr. No issues with infusion or bleeding per RN. ? ? ?Goal of Therapy:  ?Heparin level 0.3-0.7 units/ml ?Monitor platelets by anticoagulation protocol: Yes ?  ?Plan:  ?Increase heparin infusion to 1650 units/hr. ?Monitor daily heparin level and aPTT until correlating, CBC, s/sx bleeding ?F/u Cardiology plans ? ?Benetta Spar, PharmD, BCPS, BCCP ?Clinical Pharmacist ? ?Please check AMION for all [place] phone numbers ?After 10:00 PM, call [place] [phone] ? ?

## 2021-09-03 DIAGNOSIS — J441 Chronic obstructive pulmonary disease with (acute) exacerbation: Secondary | ICD-10-CM | POA: Diagnosis not present

## 2021-09-03 DIAGNOSIS — J9601 Acute respiratory failure with hypoxia: Secondary | ICD-10-CM | POA: Diagnosis not present

## 2021-09-03 LAB — BASIC METABOLIC PANEL
Anion gap: 8 (ref 5–15)
BUN: 26 mg/dL — ABNORMAL HIGH (ref 8–23)
CO2: 29 mmol/L (ref 22–32)
Calcium: 8.8 mg/dL — ABNORMAL LOW (ref 8.9–10.3)
Chloride: 100 mmol/L (ref 98–111)
Creatinine, Ser: 0.91 mg/dL (ref 0.61–1.24)
GFR, Estimated: >60 mL/min (ref >60)
Glucose, Bld: 185 mg/dL — ABNORMAL HIGH (ref 70–99)
Potassium: 3.7 mmol/L (ref 3.5–5.1)
Sodium: 137 mmol/L (ref 135–145)

## 2021-09-03 LAB — CBC
HCT: 32.2 % — ABNORMAL LOW (ref 39.0–52.0)
Hemoglobin: 10.5 g/dL — ABNORMAL LOW (ref 13.0–17.0)
MCH: 27 pg (ref 26.0–34.0)
MCHC: 32.6 g/dL (ref 30.0–36.0)
MCV: 82.8 fL (ref 80.0–100.0)
Platelets: 407 10*3/uL — ABNORMAL HIGH (ref 150–400)
RBC: 3.89 MIL/uL — ABNORMAL LOW (ref 4.22–5.81)
RDW: 13.7 % (ref 11.5–15.5)
WBC: 14.4 10*3/uL — ABNORMAL HIGH (ref 4.0–10.5)
nRBC: 0 % (ref 0.0–0.2)

## 2021-09-03 LAB — GLUCOSE, CAPILLARY
Glucose-Capillary: 209 mg/dL — ABNORMAL HIGH (ref 70–99)
Glucose-Capillary: 172 mg/dL — ABNORMAL HIGH (ref 70–99)
Glucose-Capillary: 215 mg/dL — ABNORMAL HIGH (ref 70–99)
Glucose-Capillary: 258 mg/dL — ABNORMAL HIGH (ref 70–99)

## 2021-09-03 LAB — CULTURE, BLOOD (ROUTINE X 2): Special Requests: ADEQUATE

## 2021-09-03 LAB — APTT: aPTT: 73 seconds — ABNORMAL HIGH (ref 24–36)

## 2021-09-03 LAB — HEPARIN LEVEL (UNFRACTIONATED): Heparin Unfractionated: >1.1 IU/mL — ABNORMAL HIGH (ref 0.30–0.70)

## 2021-09-03 LAB — MAGNESIUM: Magnesium: 2 mg/dL (ref 1.7–2.4)

## 2021-09-03 MED ORDER — POTASSIUM CHLORIDE 20 MEQ PO PACK
20.0000 meq | PACK | Freq: Two times a day (BID) | ORAL | Status: DC
  Administered 2021-09-03 – 2021-09-04 (×3): 20 meq via ORAL
  Filled 2021-09-03 (×3): qty 1

## 2021-09-03 MED ORDER — HYDRALAZINE HCL 20 MG/ML IJ SOLN
10.0000 mg | INTRAMUSCULAR | Status: DC | PRN
  Administered 2021-09-03 – 2021-09-04 (×3): 20 mg via INTRAVENOUS
  Filled 2021-09-03 (×3): qty 1

## 2021-09-03 MED ORDER — ROSUVASTATIN CALCIUM 20 MG PO TABS
20.0000 mg | ORAL_TABLET | Freq: Every day | ORAL | Status: DC
Start: 2021-09-03 — End: 2021-09-04
  Administered 2021-09-03 – 2021-09-04 (×2): 20 mg via ORAL
  Filled 2021-09-03 (×2): qty 1

## 2021-09-03 MED ORDER — APIXABAN 5 MG PO TABS
5.0000 mg | ORAL_TABLET | Freq: Two times a day (BID) | ORAL | Status: DC
  Administered 2021-09-03 – 2021-09-04 (×3): 5 mg via ORAL
  Filled 2021-09-03 (×3): qty 1

## 2021-09-03 MED ORDER — AMLODIPINE BESYLATE 5 MG PO TABS
5.0000 mg | ORAL_TABLET | Freq: Every day | ORAL | Status: DC
Start: 2021-09-03 — End: 2021-09-04
  Administered 2021-09-03 – 2021-09-04 (×2): 5 mg via ORAL
  Filled 2021-09-03 (×2): qty 1

## 2021-09-03 MED ORDER — FUROSEMIDE 10 MG/ML IJ SOLN
80.0000 mg | Freq: Two times a day (BID) | INTRAMUSCULAR | Status: DC
  Administered 2021-09-03 – 2021-09-04 (×2): 80 mg via INTRAVENOUS
  Filled 2021-09-03 (×2): qty 8

## 2021-09-03 NOTE — Progress Notes (Signed)
? ?NAME:  Ronald [name], MRN:  [id number], DOB:  07-27-1945, LOS: 3 ?ADMISSION DATE:  08/31/2021, CONSULTATION DATE: 3/25 ?REFERRING MD: Dr. Roderic Palau, CHIEF COMPLAINT: SOB ? ?History of Present Illness:  ?76 yo male former smoker with severe COPD on 3 liters home oxygen and diastolic CHF presented to [facility] with progressive dyspnea with hypoxia, chest tightness, and diaphoresis.  Started on Bipap.  Found to have asymmetric pulmonary infiltrate concerning for pneumonia versus CHF.  Transferred to [facility] for further management.  Has required intubation twice in the past 6 months for similar events. ? ?Pertinent  Medical History  ?DM 2, HTN, HLD, CAD s/p MI, s/p DES x2 to LAD 2015, HFpEF, Paroxysmal atrial fibrillation on eliquis, Chronic hypoxic respiratory failure on 3 liters, Tracheostomy with tracheal stenosis 2018, COPD, GERD, Anxiety, Lymphedema of arms ? ?Significant Hospital Events: ?Including procedures, antibiotic start and stop dates in addition to other pertinent events   ?3/25 Admit with SOB, cough. Concern for possible PNA vs CHF exacerbation.  ?3/27 Remains on heparin drip ans supplental oxygen  ? ?Interim History / Subjective:  ?Wants to go home ? ?Objective   ?Blood pressure (!) 149/63, pulse 88, temperature 98 ?F (36.7 ?C), temperature source Oral, resp. rate (!) 28, height '5\' 9"'$  (1.753 m), weight 82.1 kg, SpO2 93 %. ?   ?   ? ?Intake/Output Summary (Last 24 hours) at 09/03/2021 1129 ?Last data filed at 09/03/2021 1000 ?Gross per 24 hour  ?Intake 1261.38 ml  ?Output 1450 ml  ?Net -188.62 ml  ? ?Filed Weights  ? 08/31/21 1334 09/01/21 0500 09/03/21 0433  ?Weight: 81.5 kg 82.8 kg 82.1 kg  ? ? ?Examination: ?General: Acute on chronically ill appearing elderly  male lying in bed, in NAD ?HEENT: [place]/AT, MM pink/moist, PERRL,  ?Neuro: Alert and oriented x3, non-focal  ?CV: s1s2 regular rate and rhythm, no murmur, rubs, or gallops,  ?PULM:  Diminished breath sounds bilaterally, no wheezing, no increased work of breathing,  on 4L [place] ?GI: soft, bowel sounds active in all 4 quadrants, non-tender, non-distended, oral diet ?Extremities: warm/dry, chronic upper extremity edema  ?Skin: no rashes or lesions ? ?Resolved Hospital Problem list   ? Hypokalemia ? ?Assessment & Plan:  ? ?Acute on chronic hypoxic/hypercapnic respiratory failure. ?- from COPD exacerbation, acute pulmonary edema, and community acquired multifocal bacterial pneumonia (POA) ?COPD exacerbation with severe COPD from emphysema ?Daily tobacco use  ?P: ?Wean supplemental oxygen for sats greater than 88%. ?Head of bed elevated 30 degrees ?Follow intermittent chest x-ray and ABG ?Ensure adequate pulmonary hygiene  ?Follow cultures  ?Off cefepime switch to Augmentin p.o. ?Continue Pulmicort, Garlon Hatchet, and Yupelri ?PRN Albuterol  ?Continue PO steroids with slow taper  ?Tobacco cessation education ? ?Elevated troponin. ?Acute on chronic diastolic CHF with acute pulmonary edema. ?Hx of CAD s/p DES, PAF, HTN, HLD. ?- elevated troponin could be demand ischemia in setting of severe hypoxia, but he has history of CAD ?P: ?Cardiology consulted, appreciate assistance  ?Heparin drip stopped  ?Eliquis resumed  ?continue statin, lasix, amio, ASA, cozaar, lopressor, ?Heart health diet with sodium restriction when able  ?Strict intake and output  ?Daily weight to assess volume status ? ?Rt mid lung field spiculated nodular density ?- seen on CT chest from 07/10/21 ?- associated 2.4 cm subcarinal adenopathy ?P: ?Consider chest CT  ? ?Anxiety ?- likely develops dynamic hyperinflation with anxiety ?P: ?PRN Xanax  ? ?Hx of GERD ?P: ?Continue PPI ? ?DM type 2 poorly controlled with steroid induced hyperglycemia. ?  P: ?Continue SSI  ?CBG q4 ?CBG goal 140-180 ? ?Obtain physical therapy evaluation.  Likely discharge in the next day or so. ? ? ? ? ? ? ?

## 2021-09-03 NOTE — Consult Note (Signed)
? ?  [facility] CM Inpatient Consult ? ? ?09/03/2021 ? ?[name] ?07/02/1945 ?[id number] ? ?[facility] [ACO] Patient: [place] Medicare ? ?Primary Care Provider:  [name], FNP with [facility], is an embedded provider with a Chronic Care Management team and program, and is listed for the transition of care follow up and appointments. ? ?Patient was assessed for extreme high risk score for unplanned readmission hospitalization and to check for Embedded practice service needs for chronic care management follow up. Reviewed for updates and patient currently in ICU. ? ?Plan: Continue to follow and will send updates to the Embedded Chronic Care Management team and make aware of TOC needs for post hospital needs, when appropriate. ? ?Please contact for further questions, ? ?[name], RN BSN CCM ?[facility] Liaison ? [phone] business mobile phone ?Toll free office [phone]  ?Fax number: [phone] ?[email or web address] ? ? ? ?

## 2021-09-03 NOTE — Progress Notes (Signed)
? ?Progress Note ? ?Patient Name: Ronald [name] ?Date of Encounter: 09/03/2021 ? ?[facility] Cardiologist: Carlyle Dolly, MD  ? ?Subjective  ? ?PT sleeping   Groggy when wakes up   NoCP   ? ?Inpatient Medications  ?  ?Scheduled Meds: ? amiodarone  200 mg Oral Daily  ? amoxicillin-clavulanate  1 tablet Oral Q12H  ? aspirin EC  81 mg Oral Daily  ? atorvastatin  40 mg Oral QPM  ? chlorhexidine  15 mL Mouth Rinse BID  ? Chlorhexidine Gluconate Cloth  6 each Topical Q0600  ? furosemide  80 mg Intravenous Daily  ? insulin aspart  0-20 Units Subcutaneous TID WC  ? insulin aspart  0-5 Units Subcutaneous QHS  ? insulin glargine-yfgn  10 Units Subcutaneous Daily  ? ipratropium-albuterol  3 mL Nebulization BID  ? isosorbide mononitrate  10 mg Oral BID  ? losartan  100 mg Oral Daily  ? mouth rinse  15 mL Mouth Rinse q12n4p  ? metoprolol tartrate  25 mg Oral BID  ? pantoprazole  40 mg Oral Q1200  ? potassium chloride  40 mEq Oral Once  ? predniSONE  30 mg Oral Q breakfast  ? ?Continuous Infusions: ? sodium chloride 10 mL/hr at 09/03/21 0600  ? heparin 1,650 Units/hr (09/03/21 0722)  ? ?PRN Meds: ?acetaminophen, albuterol, ALPRAZolam, hydrALAZINE  ? ?Vital Signs  ?  ?Vitals:  ? 09/03/21 4782 09/03/21 9562 09/03/21 1308 09/03/21 0752  ?BP:  (!) 186/81    ?Pulse:      ?Resp:      ?Temp: 97.8 ?F (36.6 ?C)   98 ?F (36.7 ?C)  ?TempSrc: Oral   Oral  ?SpO2:  96%    ?Weight:   82.1 kg   ?Height:      ? ? ?Intake/Output Summary (Last 24 hours) at 09/03/2021 0843 ?Last data filed at 09/03/2021 0600 ?Gross per 24 hour  ?Intake 1035.41 ml  ?Output 1450 ml  ?Net -414.59 ml  ? ? ?  09/03/2021  ?  4:33 AM 09/01/2021  ?  5:00 AM 08/31/2021  ?  1:34 PM  ?Last 3 Weights  ?Weight (lbs) 181 lb 182 lb 8.7 oz 179 lb 9.6 oz  ?Weight (kg) 82.1 kg 82.8 kg 81.466 kg  ?   ? ?Telemetry  ?  ?SR  - Personally Reviewed ? ? ? ?Physical Exam  ? ?GEN: No acute distress.   ?Neck: No JVD ?Cardiac: RRR  No murmurs  ?Respiratory: rhonchi  MOVing air   More than  yesterday   ?GI: Soft, nontender, non-distended  ?MV:HQIONGE UE edema    ?Neuro:  Nonfocal  ?Psych: Normal affect  ? ?Labs  ?  ?High Sensitivity Troponin:   ?Recent Labs  ?Lab 08/31/21 ?1345 08/31/21 ?1802 08/31/21 ?1923 09/01/21 ?1104 09/01/21 ?1327  ?TROPONINIHS 248* 1,351* 1,768* 2,161* 2,110*  ?   ?Chemistry ?Recent Labs  ?Lab 08/31/21 ?1345 09/01/21 ?0247 09/02/21 ?9528  ?NA 138 137 135  ?K 3.0* 3.6 4.0  ?CL 97* 98 100  ?CO2 '31 28 30  '$ ?GLUCOSE 228* 241* 258*  ?BUN 15 17 25*  ?CREATININE 1.19 1.16 1.21  ?CALCIUM 8.5* 8.3* 8.4*  ?MG  --  1.8 2.1  ?PROT 6.8  --   --   ?ALBUMIN 3.3*  --   --   ?AST 16  --   --   ?ALT 10  --   --   ?ALKPHOS 80  --   --   ?BILITOT 0.5  --   --   ?  GFRNONAA >60 >60 >60  ?ANIONGAP '10 11 5  '$ ?  ?Lipids No results for input(s): CHOL, TRIG, HDL, LABVLDL, LDLCALC, CHOLHDL in the last 168 hours.  ?Hematology ?Recent Labs  ?Lab 09/01/21 ?1103 09/02/21 ?1594 09/03/21 ?0251  ?WBC 7.1 13.3* 14.4*  ?RBC 4.01* 4.05* 3.89*  ?HGB 10.7* 11.0* 10.5*  ?HCT 33.5* 33.8* 32.2*  ?MCV 83.5 83.5 82.8  ?MCH 26.7 27.2 27.0  ?MCHC 31.9 32.5 32.6  ?RDW 13.3 13.5 13.7  ?PLT 412* 414* 407*  ? ?Thyroid No results for input(s): TSH, FREET4 in the last 168 hours.  ?BNP ?Recent Labs  ?Lab 08/31/21 ?1345  ?BNP 250.0*  ?  ?DDimer No results for input(s): DDIMER in the last 168 hours.  ? ?Radiology  ?  ?ECHOCARDIOGRAM COMPLETE ? ?Result Date: 09/01/2021 ?   ECHOCARDIOGRAM REPORT   Patient Name:   Ronald [name] Date of Exam: 09/01/2021 Medical Rec #:  [id number]     Height:       69.0 in Accession #:    [id number]    Weight:       182.5 lb Date of Birth:  1946-05-13      BSA:          1.987 m? Patient Age:    76 years      BP:           148/72 mmHg Patient Gender: M             HR:           82 bpm. Exam Location:  Inpatient Procedure: 2D Echo Indications:    elevated troponin  History:        Patient has prior history of Echocardiogram examinations, most                 recent 05/07/2021. CAD, COPD; Risk Factors:Sleep Apnea,                  Diabetes, Hypertension, Dyslipidemia and Former Smoker.  Sonographer:    Johny Chess RDCS Referring Phys: [place]  1. Left ventricular ejection fraction, by estimation, is 55 to 60%. The left ventricle has normal function. The left ventricle demonstrates regional wall motion abnormalities (see scoring diagram/findings for description). There is mild left ventricular  hypertrophy. Left ventricular diastolic parameters are consistent with Grade I diastolic dysfunction (impaired relaxation). Elevated left ventricular end-diastolic pressure. The E/e' is 20. There is moderate hypokinesis of the left ventricular, basal inferoseptal wall, inferolateral wall and septal wall.  2. Right ventricular systolic function is hyperdynamic. The right ventricular size is normal.  3. Left atrial size was mildly dilated.  4. The mitral valve is abnormal. Trivial mitral valve regurgitation.  5. The aortic valve is tricuspid. Aortic valve regurgitation is not visualized.  6. The inferior vena cava is normal in size with greater than 50% respiratory variability, suggesting right atrial pressure of 3 mmHg.  7. Cannot exclude a small PFO. Comparison(s): Changes from prior study are noted. 05/07/2021: LVEF 50-55%, no regional wall motion abnormalities. FINDINGS  Left Ventricle: Left ventricular ejection fraction, by estimation, is 55 to 60%. The left ventricle has normal function. The left ventricle demonstrates regional wall motion abnormalities. Moderate hypokinesis of the left ventricular, basal inferoseptal  wall, inferolateral wall and septal wall. The left ventricular internal cavity size was normal in size. There is mild left ventricular hypertrophy. Left ventricular diastolic parameters are consistent with Grade I diastolic dysfunction (impaired relaxation). Elevated left ventricular end-diastolic pressure. The  E/e' is 20. Right Ventricle: The right ventricular size is normal. No increase in  right ventricular wall thickness. Right ventricular systolic function is hyperdynamic. Left Atrium: Left atrial size was mildly dilated. Right Atrium: Right atrial size was normal in size. Pericardium: There is no evidence of pericardial effusion. Mitral Valve: The mitral valve is abnormal. There is mild thickening of the anterior and posterior mitral valve leaflet(s). Trivial mitral valve regurgitation. Tricuspid Valve: The tricuspid valve is grossly normal. Tricuspid valve regurgitation is trivial. Aortic Valve: The aortic valve is tricuspid. Aortic valve regurgitation is not visualized. Pulmonic Valve: The pulmonic valve was grossly normal. Pulmonic valve regurgitation is trivial. Aorta: The aortic root and ascending aorta are structurally normal, with no evidence of dilitation. Venous: The inferior vena cava is normal in size with greater than 50% respiratory variability, suggesting right atrial pressure of 3 mmHg. IAS/Shunts: Cannot exclude a small PFO.  LEFT VENTRICLE PLAX 2D LVIDd:         5.20 cm   Diastology LVIDs:         3.80 cm   LV e' medial:    3.92 cm/s LV PW:         1.30 cm   LV E/e' medial:  25.0 LV IVS:        1.00 cm   LV e' lateral:   6.64 cm/s LVOT diam:     2.30 cm   LV E/e' lateral: 14.8 LV SV:         98 LV SV Index:   49 LVOT Area:     4.15 cm?  RIGHT VENTRICLE             IVC RV S prime:     15.40 cm/s  IVC diam: 1.90 cm TAPSE (M-mode): 2.0 cm LEFT ATRIUM             Index        RIGHT ATRIUM           Index LA diam:        4.00 cm 2.01 cm/m?   RA Area:     15.80 cm? LA Vol (A2C):   68.3 ml 34.37 ml/m?  RA Volume:   40.10 ml  20.18 ml/m? LA Vol (A4C):   61.6 ml 31.00 ml/m? LA Biplane Vol: 68.7 ml 34.57 ml/m?  AORTIC VALVE LVOT Vmax:   106.00 cm/s LVOT Vmean:  73.100 cm/s LVOT VTI:    0.235 m  AORTA Ao Root diam: 3.50 cm Ao Asc diam:  3.10 cm MITRAL VALVE MV Area (PHT): 4.68 cm?     SHUNTS MV Decel Time: 162 msec     Systemic VTI:  0.24 m MV E velocity: 98.10 cm/s   Systemic Diam: 2.30 cm  MV A velocity: 143.00 cm/s MV E/A ratio:  0.69 Lyman Bishop MD Electronically signed by Lyman Bishop MD Signature Date/Time: 09/01/2021/3:08:42 PM    Final    ? ?Cardiac Studies  ? ?Echo  08/04/21 ? ?eft v

## 2021-09-03 NOTE — Telephone Encounter (Signed)
Patient is scheduled for new patient consult with Dr. Valeta Harms on 09/23/2021 at 10:30am. Appointment reminder mailed to address on file.  ? ?

## 2021-09-03 NOTE — Progress Notes (Signed)
eLink Physician-Brief Progress Note ?Patient Name: Ronald [name] ?DOB: 09/02/1945 ?MRN: [id number] ? ? ?Date of Service ? 09/03/2021  ?HPI/Events of Note ? Patient with sub-optimal BP control.  ?eICU Interventions ? PRN Hydralazine dosing changed to 10-20 mg iv Q 4 hours.  ? ? ? ?  ? ?Kerry Kass Korynne Dols ?09/03/2021, 4:11 AM ?

## 2021-09-04 DIAGNOSIS — I214 Non-ST elevation (NSTEMI) myocardial infarction: Secondary | ICD-10-CM | POA: Diagnosis not present

## 2021-09-04 DIAGNOSIS — J9601 Acute respiratory failure with hypoxia: Secondary | ICD-10-CM | POA: Diagnosis not present

## 2021-09-04 LAB — CBC
HCT: 31.9 % — ABNORMAL LOW (ref 39.0–52.0)
Hemoglobin: 10.2 g/dL — ABNORMAL LOW (ref 13.0–17.0)
MCH: 26.6 pg (ref 26.0–34.0)
MCHC: 32 g/dL (ref 30.0–36.0)
MCV: 83.1 fL (ref 80.0–100.0)
Platelets: 356 10*3/uL (ref 150–400)
RBC: 3.84 MIL/uL — ABNORMAL LOW (ref 4.22–5.81)
RDW: 13.8 % (ref 11.5–15.5)
WBC: 9.5 10*3/uL (ref 4.0–10.5)
nRBC: 0 % (ref 0.0–0.2)

## 2021-09-04 LAB — GLUCOSE, CAPILLARY
Glucose-Capillary: 153 mg/dL — ABNORMAL HIGH (ref 70–99)
Glucose-Capillary: 122 mg/dL — ABNORMAL HIGH (ref 70–99)
Glucose-Capillary: 176 mg/dL — ABNORMAL HIGH (ref 70–99)

## 2021-09-04 MED ORDER — PREDNISONE 10 MG PO TABS: 30.0000 mg | ORAL_TABLET | Freq: Every day | ORAL | 0 refills | Status: AC

## 2021-09-04 MED ORDER — ROSUVASTATIN CALCIUM 20 MG PO TABS
20.0000 mg | ORAL_TABLET | Freq: Every day | ORAL | 0 refills | Status: DC
Start: 2021-09-05 — End: 2021-10-02

## 2021-09-04 MED ORDER — AMLODIPINE BESYLATE 5 MG PO TABS: 5.0000 mg | ORAL_TABLET | Freq: Every day | ORAL | 0 refills | Status: DC

## 2021-09-04 MED ORDER — AMOXICILLIN-POT CLAVULANATE 875-125 MG PO TABS: 1.0000 | ORAL_TABLET | Freq: Two times a day (BID) | ORAL | 0 refills | Status: DC

## 2021-09-04 MED ORDER — POTASSIUM CHLORIDE 20 MEQ PO PACK: 20.0000 meq | PACK | Freq: Every day | ORAL | 0 refills | Status: DC

## 2021-09-04 MED ORDER — FUROSEMIDE 40 MG PO TABS: 60.0000 mg | ORAL_TABLET | Freq: Every day | ORAL | 11 refills | Status: DC

## 2021-09-04 MED ORDER — ASPIRIN 81 MG PO TBEC: 81.0000 mg | DELAYED_RELEASE_TABLET | Freq: Every day | ORAL | 11 refills | Status: DC

## 2021-09-04 NOTE — Discharge Summary (Signed)
Physician Discharge Summary  ?Ronald [name] PBD:[id number] DOB: 05-27-1946 DOA: 08/31/2021 ? ?PCP: [name], FNP ? ?Admit date: 08/31/2021 ?Discharge date: 09/04/2021 ? ?Time spent: 45 minutes ? ?Recommendations for Outpatient Follow-up:  ?Follow-up with primary care physician in 1 to 2 weeks ?Follow-up with Dr. Valeta Harms with pulmonology in April as scheduled ?Patient needs rehab referral for pulmonology done by primary care physician as this was not allowed at discharge per hospital policy ? ?Discharge Diagnoses:  ?Principal Problem: ?  Respiratory failure ([place]) ?Active Problems: ?  COPD exacerbation ([place]) ?  Non-ST elevation (NSTEMI) myocardial infarction [facility]) ? ? ?Discharge Condition: Stable ? ? ?Filed Weights  ? 09/01/21 0500 09/03/21 0433 09/04/21 0500  ?Weight: 82.8 kg 82.1 kg 81.4 kg  ? ? ?History of present illness:  ?76 yo male former smoker with severe COPD on 3 liters home oxygen and diastolic CHF presented to [facility] with progressive dyspnea with hypoxia, chest tightness, and diaphoresis.  Started on Bipap.  Found to have asymmetric pulmonary infiltrate concerning for pneumonia versus CHF.  Transferred to [facility] for further management.  Has required intubation twice in the past 6 months for similar events. ?  ? ?Hospital Course:  ?Patient was admitted closely watched for fear of needing intubation.  Patient responded well to BiPAP.  Respiratory failure likely thought to be due to more to pneumonia and COPD exacerbation.  However all multifactorial.  Patient was weaned back to his home oxygen needs of 3 L.  Cardiology and pulmonology both were involved in patient's care during hospitalization.  By the time of discharge his physical therapy was recommending outpatient treatment and he was doing well walking in the hallways with minimal assistance and a walker.  Attempts were made to set up pulmonary rehab however this has to be deferred to his primary care physician for appropriate outpatient follow-up per  hospital policy.  We are obtaining maximum home support with home health physical therapy.  He is to follow-up with his primary care physician in 1 to 2 weeks.  He also has an appointment set up with Dr. Valeta Harms pulmonologist later on in April.  He will need repeat CAT scan done in a couple weeks to follow-up on a pulmonary nodule.  He is being discharged on a course of Augmentin and steroids along with adjustments in his Lasix per cardiology.  Patient be discharged in stable and improved condition with the appropriate follow-up as mentioned.  I have also spoken to his daughter-in-law on the phone about the plan to help increase compliance with the plan by patient. ? ?Discharge Exam: ?Vitals:  ? 09/04/21 1300 09/04/21 1400  ?BP: (!) 110/56 (!) 100/59  ?Pulse: 72 66  ?Resp: (!) 25 (!) 22  ?Temp:    ?SpO2: 96% 98%  ? ? ?General: Alert and oriented no apparent distress ?Cardiovascular: Regular rate and rhythm without murmurs rubs or gallops ?Respiratory: Clear to auscultation bilateral no wheezes rhonchi or rales ? ?Discharge Instructions ? ? ?Discharge Instructions   ? ? AMB referral to pulmonary rehabilitation   Complete by: As directed ?  ? Please select a program: Pulmonary Rehabilitation  ? Diagnosis: (See process instructions below for COPD requirements): Emphysema  ? Program Prescription: O2 Administration by RT, EP, or RN if SpO2<88%  ? After initial evaluation and assessments completed: Virtual Based Care may be provided alone or in conjunction with Pulmonary Rehab/Respiratory Care services based on patient barriers.: Yes  ? Diet - low sodium heart healthy   Complete by:  As directed ?  ? Discharge instructions   Complete by: As directed ?  ? Follow-up with Dr. Valeta Harms pulmonologist April 17 ? ?Follow-up with primary care physician in 1 to 2 weeks  ? Increase activity slowly   Complete by: As directed ?  ? ?  ? ?Allergies as of 09/04/2021   ? ?   Reactions  ? Gabapentin Anxiety  ? Unknown reaction  ? Metformin And  Related Rash  ? ?  ? ?  ?Medication List  ?  ? ?STOP taking these medications   ? ?atorvastatin 40 MG tablet ?Commonly known as: LIPITOR ?  ? ?  ? ?TAKE these medications   ? ?Accu-Chek Guide Me w/Device Kit ?Check BS daily Dx E11.9 ?  ?albuterol 108 (90 Base) MCG/ACT inhaler ?Commonly known as: VENTOLIN HFA ?INHALE 2 PUFFS EVERY 6 HOURS AS NEEEDED ?What changed: See the new instructions. ?  ?ALPRAZolam 0.5 MG tablet ?Commonly known as: Duanne Moron ?Take 1 tablet (0.5 mg total) by mouth 2 (two) times daily as needed. for anxiety ?  ?amiodarone 200 MG tablet ?Commonly known as: PACERONE ?Take 1 tablet (200 mg total) by mouth daily. ?  ?amLODipine 5 MG tablet ?Commonly known as: NORVASC ?Take 1 tablet (5 mg total) by mouth daily. ?Start taking on: September 05, 2021 ?  ?amoxicillin-clavulanate 875-125 MG tablet ?Commonly known as: AUGMENTIN ?Take 1 tablet by mouth every 12 (twelve) hours. ?  ?aspirin 81 MG EC tablet ?Take 1 tablet (81 mg total) by mouth daily. Swallow whole. ?Start taking on: September 05, 2021 ?  ?blood glucose meter kit and supplies ?Dispense based on patient and insurance preference. Use up to four times daily as directed. (FOR ICD-10 E10.9, E11.9). ?  ?budesonide 0.5 MG/2ML nebulizer solution ?Commonly known as: PULMICORT ?Take 2 mLs (0.5 mg total) by nebulization 2 (two) times daily. ?  ?Eliquis 5 MG Tabs tablet ?Generic drug: apixaban ?Take 1 tablet (5 mg total) by mouth 2 (two) times daily. ?  ?furosemide 40 MG tablet ?Commonly known as: Lasix ?Take 1.5 tablets (60 mg total) by mouth daily. ?What changed: how much to take ?  ?glucose blood test strip ?Commonly known as: ONE TOUCH ULTRA TEST ?USE TO CHECK GLUCOSE ONCE DAILY ?  ?ipratropium-albuterol 0.5-2.5 (3) MG/3ML Soln ?Commonly known as: DUONEB ?Take 3 mLs by nebulization every 6 (six) hours as needed (asthma). ?  ?isosorbide mononitrate 10 MG tablet ?Commonly known as: ISMO ?Take 1 tablet (10 mg total) by mouth 2 (two) times daily. ?  ?losartan 100 MG  tablet ?Commonly known as: COZAAR ?Take 1 tablet (100 mg total) by mouth daily. ?  ?metoprolol tartrate 25 MG tablet ?Commonly known as: LOPRESSOR ?Take 1 tablet (25 mg total) by mouth 2 (two) times daily. ?  ?nitroGLYCERIN 0.4 MG SL tablet ?Commonly known as: NITROSTAT ?Place 0.4 mg under the tongue every 5 (five) minutes as needed for chest pain. ?  ?omeprazole 20 MG capsule ?Commonly known as: PRILOSEC ?Take 20 mg by mouth daily. ?  ?OXYGEN ?Inhale 3-4 L into the lungs daily. ?  ?potassium chloride 20 MEQ packet ?Commonly known as: KLOR-CON ?Take 20 mEq by mouth daily. ?  ?predniSONE 10 MG tablet ?Commonly known as: DELTASONE ?Take 3 tablets (30 mg total) by mouth daily with breakfast for 7 days. ?Start taking on: September 05, 2021 ?  ?rosuvastatin 20 MG tablet ?Commonly known as: CRESTOR ?Take 1 tablet (20 mg total) by mouth daily. ?Start taking on: September 05, 2021 ?  ? ?  ? ?  Allergies  ?Allergen Reactions  ? Gabapentin Anxiety  ?  Unknown reaction  ? Metformin And Related Rash  ? ? Follow-up Information   ? ? [facility] [place] Follow up.   ?Specialty: Home Health Services ?Why: Someone will call you to schedule first home visit. ?Contact information: ?[address] ?[address] ?[place] [address] ?[phone] ? ? ?  ?  ? ? Fay Records, MD Follow up.   ?Specialty: Cardiology ?Why: office will call with date and time of lab and appointment ?Contact information: ?[address] ?[place] [address] ?[phone] ? ? ?  ?  ? ?  ?  ? ?  ? ? ? ?The results of significant diagnostics from this hospitalization (including imaging, microbiology, ancillary and laboratory) are listed below for reference.   ? ?Significant Diagnostic Studies: ?DG Chest Port 1 View ? ?Result Date: 09/01/2021 ?CLINICAL DATA:  Shortness of breath.  Follow-up exam. EXAM: PORTABLE CHEST 1 VIEW COMPARISON:  08/31/2021 and earlier studies. FINDINGS: Mild interval decrease in interstitial and airspace opacities in the right mid to lower lung.  Stable mild airspace opacities at the left lung base. Lungs remain hyperexpanded with relative lucency in the upper lobes consistent with emphysema. No pneumothorax. IMPRESSION: 1. Mild interval improvement i

## 2021-09-04 NOTE — TOC Transition Note (Signed)
Transition of Care (TOC) - CM/SW Discharge Note ? ? ?Patient Details  ?Name: Ronald [name] ?MRN: [id number] ?Date of Birth: [birth year] ? ?Transition of Care (TOC) CM/SW Contact:  ?Tom-Johnson, Renea Ee, RN ?Phone Number: ?09/04/2021, 1:35 PM ? ? ?Clinical Narrative:    ? ?Patient is scheduled for discharge today. CM spoke with patient's son Miro Balderson, per patient's request. Patient is from home with his wife. Has two children. Retired. Has a cane, walker and shower seat at home. Home health referral called in to [facility] per son's request from Medicare.gov list. Marjory Lies with [facility] voiced acceptance, information on AVS.  ?PCP is Hawks, Theador Hawthorne, FNP and uses [facility] in [place].  ?Son to transport at discharge. No further TOC needs noted. ? ?Final next level of care: [place] ?Barriers to Discharge: Barriers Resolved ? ? ?Patient Goals and CMS Choice ?Patient states their goals for this hospitalization and ongoing recovery are:: To return home ?CMS Medicare.gov Compare Post Acute Care list provided to:: Patient ?Choice offered to / list presented to : Patient, Adult Children (Son) ? ?Discharge Placement ?  ?           ?  ?Patient to be transferred to facility by: Son, Alyan Hartline. ?  ?  ? ?Discharge Plan and Services ?  ?  ?           ?DME Arranged: N/A ?DME Agency: NA ?  ?  ?  ?HH Arranged: PT, RN ?[place] Agency: [facility] ?Date HH Agency Contacted: 09/04/21 ?Time [place]: 1330 ?Representative spoke with at [place]: Marjory Lies ? ?Social Determinants of Health (SDOH) Interventions ?  ? ? ?Readmission Risk Interventions ? ?  09/04/2021  ?  1:32 PM 09/28/2020  ?  8:28 AM  ?Readmission Risk Prevention Plan  ?Transportation Screening Complete Complete  ?[place] or Home Care Consult  Complete  ?Social Work Consult for [place] Planning/Counseling  Complete  ?Palliative Care Screening  Not Applicable  ?Medication Review Press photographer) Complete Complete  ?PCP or Specialist  appointment within 3-5 days of discharge Complete   ?[place] or Home Care Consult Complete   ?SW Recovery Care/Counseling Consult Complete   ?Palliative Care Screening Not Applicable   ?Comments N/A   ?[place] Not Applicable   ? ? ? ? ? ?

## 2021-09-04 NOTE — Progress Notes (Signed)
Patient"s daughter-in-law present w/ patient for review of discharge instructions. Questions encouraged and answered. Ativan held by Rx given to patient after counting w/ family/patient. Patient discharged w/ 3L O2 on O2 tank.  ?

## 2021-09-04 NOTE — Evaluation (Signed)
Physical Therapy Evaluation ?Patient Details ?Name: Ronald [name] ?MRN: [id number] ?DOB: 1946-01-29 ?Today's Date: 09/04/2021 ? ?History of Present Illness ? 76 yo male  presented to [facility] 08/31/21 with progressive dyspnea with hypoxia, chest tightness, and diaphoresis.  Started on Bipap.  Found to have asymmetric pulmonary infiltrate concerning for pneumonia versus CHF.  Transferred to [facility] for further management.  Has required intubation twice in the past 6 months for similar events. PMH: former smoker with severe COPD on 3 liters home oxygen, diastolic CHF, DM 2, HTN, HLD, CAD s/p MI, s/p DES x2 to LAD 2015, HFpEF, Paroxysmal atrial fibrillation on eliquis, Tracheostomy with tracheal stenosis 2018, GERD, Anxiety, Lymphedema of arms  ?Clinical Impression ? PTA pt living in 2 story home with 2 steps to enter with his ex-wife who is available for assistance intermittently. Pt reports ambulating in his home with and without RW. Pt has been taking bird baths but is able to perform bathing and dressing. Family assists for iADLs. Pt reports that he had finished with HHPT 2 day prior to returning to hospital and feels that he would benefit from a few more sessions of HHPT. MD in room during session and also recommends Pulmonary Rehab. PT recommends HHPT until Pulmonary Rehab can be set up. PT will continue to follow acutely however pt likely to d/c home today.   ?   ? ?Recommendations for follow up therapy are one component of a multi-disciplinary discharge planning process, led by the attending physician.  Recommendations may be updated based on patient status, additional functional criteria and insurance authorization. ? ?Follow Up Recommendations Home health PT (Pulmonary Rehab recommended py MD, PT concurs) ? ?  ?Assistance Recommended at Discharge Intermittent Supervision/Assistance  ?Patient can return home with the following ? A little help with walking and/or transfers;A little help with  bathing/dressing/bathroom;Assistance with cooking/housework;Assist for transportation;Help with stairs or ramp for entrance ? ?  ?Equipment Recommendations None recommended by PT  ?   ?Functional Status Assessment Patient has had a recent decline in their functional status and demonstrates the ability to make significant improvements in function in a reasonable and predictable amount of time.  ? ?  ?Precautions / Restrictions Restrictions ?Weight Bearing Restrictions: No  ? ?  ? ?Mobility ? Bed Mobility ?Overal bed mobility: Modified Independent ?  ?  ?  ?  ?  ?  ?General bed mobility comments: use of bedrails to come to EoB ?  ? ?Transfers ?Overall transfer level: Needs assistance ?Equipment used: Rolling walker (2 wheels) ?Transfers: Sit to/from Stand ?Sit to Stand: Supervision ?  ?  ?  ?  ?  ?General transfer comment: good power up and self steadying in standing ?  ? ?Ambulation/Gait ?Ambulation/Gait assistance: Min guard, Supervision ?Gait Distance (Feet): 150 Feet ?Assistive device: Rolling walker (2 wheels) ?Gait Pattern/deviations: Step-through pattern, Decreased step length - right, Decreased step length - left ?Gait velocity: slowed ?Gait velocity interpretation: <1.31 ft/sec, indicative of household ambulator ?  ?General Gait Details: min guard progressing to supervision with slowed steady gait ? ?Stairs ?  ?  ?  ?  ?  ? ?Wheelchair Mobility ?  ? ?Modified Rankin (Stroke Patients Only) ?  ? ?  ? ?Balance Overall balance assessment: Mild deficits observed, not formally tested ?  ?  ?  ?  ?  ?  ?  ?  ?  ?  ?  ?  ?  ?  ?  ?  ?  ?  ?   ? ? ? ?  Pertinent Vitals/Pain Pain Assessment ?Pain Assessment: No/denies pain  ? ? ?Home Living Family/patient expects to be discharged to:: Private residence ?Living Arrangements: Other (Comment) (ex-wife) ?Available Help at Discharge: Family;Available PRN/intermittently ?Type of Home: House ?Home Access: Stairs to enter ?  ?Entrance Stairs-Number of Steps: 2 ?Alternate Level  Stairs-Number of Steps: 10 ?Home Layout: Two level;Able to live on main level with bedroom/bathroom ?Home Equipment: Rollator (4 wheels);BSC/3in1;Shower seat ?   ?  ?Prior Function Prior Level of Function : Needs assist ?  ?  ?  ?Physical Assist : Mobility (physical);ADLs (physical) ?Mobility (physical): Stairs ?ADLs (physical): IADLs ?Mobility Comments: uses furniture or RW in the house, RW for limited community ambulation ?ADLs Comments: ex-wife performs iADLs, pt indpendent in ADLs ?  ? ? ?Hand Dominance  ? Dominant Hand: Right ? ?  ?Extremity/Trunk Assessment  ? Upper Extremity Assessment ?Upper Extremity Assessment: RUE deficits/detail;LUE deficits/detail ?RUE Deficits / Details: hx lymphedema ?LUE Deficits / Details: hx of lymphedema ?  ? ?Lower Extremity Assessment ?Lower Extremity Assessment: Generalized weakness ?  ? ?   ?Communication  ? Communication: No difficulties;HOH  ?Cognition Arousal/Alertness: Awake/alert ?Behavior During Therapy: [facility] for tasks assessed/performed ?Overall Cognitive Status: Within Functional Limits for tasks assessed ?  ?  ?  ?  ?  ?  ?  ?  ?  ?  ?  ?  ?  ?  ?  ?  ?  ?  ?  ? ?  ?General Comments General comments (skin integrity, edema, etc.): at rest BP 105/55, HR 62, SpO2 on 3L O2 via HFNC SpO2 94%O2, with ambulation SpO2 dropped to 85%O2, unable to rebound with cues for purse lip breathing, bumped up to 4L O2 to finish ambulation and SpO2 rebounded to 91%O2, able to wean back to 3L with seated EoB ? ?  ?Exercises    ? ?Assessment/Plan  ?  ?PT Assessment Patient needs continued PT services  ?PT Problem List Decreased activity tolerance;Decreased mobility;Cardiopulmonary status limiting activity ? ?   ?  ?PT Treatment Interventions DME instruction;Gait training;Stair training;Functional mobility training;Therapeutic activities;Therapeutic exercise;Balance training;Cognitive remediation;Patient/family education   ? ?PT Goals (Current goals can be found in the Care Plan section)   ?Acute Rehab PT Goals ?Patient Stated Goal: stop coming to hospital ?PT Goal Formulation: With patient ?Time For Goal Achievement: 09/18/21 ?Potential to Achieve Goals: Good ? ?  ?Frequency Min 3X/week ?  ? ? ?   ?AM-PAC PT "6 Clicks" Mobility  ?Outcome Measure Help needed turning from your back to your side while in a flat bed without using bedrails?: None ?Help needed moving from lying on your back to sitting on the side of a flat bed without using bedrails?: None ?Help needed moving to and from a bed to a chair (including a wheelchair)?: A Little ?Help needed standing up from a chair using your arms (e.g., wheelchair or bedside chair)?: A Little ?Help needed to walk in hospital room?: A Little ?Help needed climbing 3-5 steps with a railing? : A Lot ?6 Click Score: 19 ? ?  ?End of Session Equipment Utilized During Treatment: Gait belt;Oxygen ?Activity Tolerance: Patient tolerated treatment well ?Patient left: in bed;with call bell/phone within reach;with bed alarm set ?Nurse Communication: Mobility status ?PT Visit Diagnosis: Unsteadiness on feet (R26.81);Other abnormalities of gait and mobility (R26.89);Muscle weakness (generalized) (M62.81) ?  ? ?Time: 3818-2993 ?PT Time Calculation (min) (ACUTE ONLY): 40 min ? ? ?Charges:   PT Evaluation ?$PT Eval Moderate Complexity: 1 Mod ?PT Treatments ?$Therapeutic Exercise: 23-37  mins ?  ?   ? ? ?Darlena Koval B. Migdalia Dk PT, DPT ?Acute Rehabilitation Services ?Pager [phone] ?Office [phone] ? ? ?[place] ?09/04/2021, 10:10 AM ? ?

## 2021-09-04 NOTE — Progress Notes (Incomplete)
Patient is discharged, to be picked up by familt and home w/ family. Have interacted with patient. He is eager to get home, appears well. His home dose of ativan was locked in pharmacy w/ admission. I have procured and will give to patient and family upon discharge.  ?

## 2021-09-04 NOTE — Progress Notes (Signed)
? ?Progress Note ? ?Patient Name: Ronald [name] ?Date of Encounter: 09/04/2021 ? ?[facility] Cardiologist: Carlyle Dolly, MD  ? ?Subjective  ? ?Pt is resting comfortably ? ?Inpatient Medications  ?  ?Scheduled Meds: ? amiodarone  200 mg Oral Daily  ? amLODipine  5 mg Oral Daily  ? amoxicillin-clavulanate  1 tablet Oral Q12H  ? apixaban  5 mg Oral BID  ? aspirin EC  81 mg Oral Daily  ? chlorhexidine  15 mL Mouth Rinse BID  ? Chlorhexidine Gluconate Cloth  6 each Topical Q0600  ? furosemide  80 mg Intravenous BID  ? insulin aspart  0-20 Units Subcutaneous TID WC  ? insulin aspart  0-5 Units Subcutaneous QHS  ? insulin glargine-yfgn  10 Units Subcutaneous Daily  ? ipratropium-albuterol  3 mL Nebulization BID  ? isosorbide mononitrate  10 mg Oral BID  ? losartan  100 mg Oral Daily  ? mouth rinse  15 mL Mouth Rinse q12n4p  ? metoprolol tartrate  25 mg Oral BID  ? pantoprazole  40 mg Oral Q1200  ? potassium chloride  20 mEq Oral BID  ? potassium chloride  40 mEq Oral Once  ? predniSONE  30 mg Oral Q breakfast  ? rosuvastatin  20 mg Oral Daily  ? ?Continuous Infusions: ? sodium chloride 10 mL/hr at 09/03/21 1800  ? ?PRN Meds: ?acetaminophen, albuterol, ALPRAZolam, hydrALAZINE  ? ?Vital Signs  ?  ?Vitals:  ? 09/03/21 2116 09/04/21 0006 09/04/21 0353 09/04/21 0500  ?BP: (!) 164/61     ?Pulse: 71     ?Resp:      ?Temp:  (!) 97 ?F (36.1 ?C) 98.1 ?F (36.7 ?C)   ?TempSrc:  Axillary Oral   ?SpO2:      ?Weight:    81.4 kg  ?Height:      ? ? ?Intake/Output Summary (Last 24 hours) at 09/04/2021 0612 ?Last data filed at 09/04/2021 0100 ?Gross per 24 hour  ?Intake 1318.95 ml  ?Output 3600 ml  ?Net -2281.05 ml  ? ?Net neg  3.1 L  ? ? ? ?  09/04/2021  ?  5:00 AM 09/03/2021  ?  4:33 AM 09/01/2021  ?  5:00 AM  ?Last 3 Weights  ?Weight (lbs) 179 lb 7.3 oz 181 lb 182 lb 8.7 oz  ?Weight (kg) 81.4 kg 82.1 kg 82.8 kg  ?   ? ?Telemetry  ?  ?SR  - Personally Reviewed ? ? ? ?Physical Exam  ? ?GEN: No acute distress.   ?Neck: No JVD ?Cardiac:  RRR  No murmurs  ?Respiratory: Moving air   No wheezes or rales  ?GI: Soft, nontender, non-distended  ?MW:NUUVOZD UE edema, mild  Improved from yesterday    ?Neuro:  Nonfocal  ?Psych: Normal affect  ? ?Labs  ?  ?High Sensitivity Troponin:   ?Recent Labs  ?Lab 08/31/21 ?1345 08/31/21 ?1802 08/31/21 ?1923 09/01/21 ?1104 09/01/21 ?1327  ?TROPONINIHS 248* 1,351* 1,768* 2,161* 2,110*  ?   ?Chemistry ?Recent Labs  ?Lab 08/31/21 ?1345 09/01/21 ?0247 09/02/21 ?6644 09/03/21 ?0750  ?NA 138 137 135 137  ?K 3.0* 3.6 4.0 3.7  ?CL 97* 98 100 100  ?CO2 '31 28 30 29  '$ ?GLUCOSE 228* 241* 258* 185*  ?BUN 15 17 25* 26*  ?CREATININE 1.19 1.16 1.21 0.91  ?CALCIUM 8.5* 8.3* 8.4* 8.8*  ?MG  --  1.8 2.1 2.0  ?PROT 6.8  --   --   --   ?ALBUMIN 3.3*  --   --   --   ?  AST 16  --   --   --   ?ALT 10  --   --   --   ?ALKPHOS 80  --   --   --   ?BILITOT 0.5  --   --   --   ?GFRNONAA >60 >60 >60 >60  ?ANIONGAP '10 11 5 8  '$ ?  ?Lipids No results for input(s): CHOL, TRIG, HDL, LABVLDL, LDLCALC, CHOLHDL in the last 168 hours.  ?Hematology ?Recent Labs  ?Lab 09/01/21 ?7673 09/02/21 ?4193 09/03/21 ?0251  ?WBC 7.1 13.3* 14.4*  ?RBC 4.01* 4.05* 3.89*  ?HGB 10.7* 11.0* 10.5*  ?HCT 33.5* 33.8* 32.2*  ?MCV 83.5 83.5 82.8  ?MCH 26.7 27.2 27.0  ?MCHC 31.9 32.5 32.6  ?RDW 13.3 13.5 13.7  ?PLT 412* 414* 407*  ? ?Thyroid No results for input(s): TSH, FREET4 in the last 168 hours.  ?BNP ?Recent Labs  ?Lab 08/31/21 ?1345  ?BNP 250.0*  ?  ?DDimer No results for input(s): DDIMER in the last 168 hours.  ? ?Radiology  ?  ?No results found. ? ?Cardiac Studies  ? ?Echo  08/04/21 ? ?eft ventricular ejection fraction, by estimation, is 55 to 60%. The left ventricle has ?normal function. The left ventricle demonstrates regional wall motion abnormalities ?(see scoring diagram/findings for description). There is mild left ventricular ?hypertrophy. Left ventricular diastolic parameters are consistent with Grade I diastolic ?dysfunction (impaired relaxation). Elevated left  ventricular end-diastolic pressure. The ?E/e' is 20. There is moderate hypokinesis of the left ventricular, basal inferoseptal wall, ?inferolateral wall and septal wall. ?1. ?Right ventricular systolic function is hyperdynamic. The right ventricular size is ?normal. ?2. ?3. Left atrial size was mildly dilated. ?4. The mitral valve is abnormal. Trivial mitral valve regurgitation. ?5. The aortic valve is tricuspid. Aortic valve regurgitation is not visualized. ?The inferior vena cava is normal in size with greater than 50% respiratory variability, ?suggesting right atrial pressure of 3 mmHg. ?6. ?7. Cannot exclude a small PFO. ?Comparison(s): Changes from prior study are noted. 05/07/2021: LVEF 50-55%, no regional wall ?motion abnormalities. ? ?Patient Profile  ?   ?76 year old male with history of coronary artery disease and prior DES to the LAD for NSTEMI, hypertension, dyslipidemia, severe COPD on oxygen, prior stroke, type 2 diabetes and tobacco abuse, who presents with progressive hypoxia, chest tightness and diaphoresis thought to have pneumonia versus CHF exacerbation. ? ?Assessment & Plan  ?  ?1  Respiiratory failurePt with severe COPD   Rx per CCM  Significantly improved    ? ?2  Elevated troponin  Pek troponin 2161 ( 248, 1351, 1768, 2161, 2110)  Most likely due to demand in settting of CAD and COPD flare   No evid of active ischemia  ? ?3  Hx of CAD   Pt with known dz   last intervention in 2015   Cath in 2021 showed patent stents   ? ?5  Hx diastolic CHF Pt has had signficant response with increased lasix    I would recomm 60 mg daily at home with 20 KCL    ?I will arrange for labs as an outpt in [place] ? ?4  Hx of Afib/flutter   Remains in SR    ON amiodarone 200 mg daily and Eliquis ?   ? ?6  HTN  BP is overall better   Folllow as outpt on new regimen  ? ?7 HL  Switch lipitor to Crestor ? ? ?Will make sure he has f/u in clnic  ? ?For questions or  updates, please contact [place] ?Please consult  [email or web address] for contact info under  ? ?  ?   ?Signed, ?Dorris Carnes, MD  ?09/04/2021, 6:12 AM   ? ?

## 2021-09-05 ENCOUNTER — Telehealth: Payer: Self-pay

## 2021-09-05 ENCOUNTER — Other Ambulatory Visit: Payer: Self-pay

## 2021-09-05 DIAGNOSIS — I2510 Atherosclerotic heart disease of native coronary artery without angina pectoris: Secondary | ICD-10-CM

## 2021-09-05 LAB — CULTURE, BLOOD (ROUTINE X 2)
Culture: NO GROWTH
Special Requests: ADEQUATE

## 2021-09-05 NOTE — Telephone Encounter (Signed)
Transition Care Management Unsuccessful Follow-up Telephone Call ? ?Date of discharge and from where:  Ronald [name] 09-04-21 Dx: respiratory failure  ? ?Attempts:  1st Attempt ? ?Reason for unsuccessful TCM follow-up call:  Left voice message ? ?  ?

## 2021-09-05 NOTE — Addendum Note (Signed)
Addended by: Barbarann Ehlers A on: 09/05/2021 01:48 PM ? ? Modules accepted: Orders ? ?

## 2021-09-05 NOTE — Progress Notes (Signed)
Per Dr.Ross, lab orders entered for [facility]  ?

## 2021-09-06 ENCOUNTER — Telehealth: Payer: Medicare HMO | Admitting: *Deleted

## 2021-09-07 DIAGNOSIS — E1165 Type 2 diabetes mellitus with hyperglycemia: Secondary | ICD-10-CM | POA: Diagnosis not present

## 2021-09-07 DIAGNOSIS — F32A Depression, unspecified: Secondary | ICD-10-CM | POA: Diagnosis not present

## 2021-09-07 DIAGNOSIS — I4892 Unspecified atrial flutter: Secondary | ICD-10-CM | POA: Diagnosis not present

## 2021-09-07 DIAGNOSIS — E1140 Type 2 diabetes mellitus with diabetic neuropathy, unspecified: Secondary | ICD-10-CM | POA: Diagnosis not present

## 2021-09-07 DIAGNOSIS — I2510 Atherosclerotic heart disease of native coronary artery without angina pectoris: Secondary | ICD-10-CM | POA: Diagnosis not present

## 2021-09-07 DIAGNOSIS — K219 Gastro-esophageal reflux disease without esophagitis: Secondary | ICD-10-CM | POA: Diagnosis not present

## 2021-09-07 DIAGNOSIS — R32 Unspecified urinary incontinence: Secondary | ICD-10-CM | POA: Diagnosis not present

## 2021-09-07 DIAGNOSIS — I110 Hypertensive heart disease with heart failure: Secondary | ICD-10-CM | POA: Diagnosis not present

## 2021-09-07 DIAGNOSIS — I700 Atherosclerosis of aorta: Secondary | ICD-10-CM | POA: Diagnosis not present

## 2021-09-07 DIAGNOSIS — E669 Obesity, unspecified: Secondary | ICD-10-CM | POA: Diagnosis not present

## 2021-09-07 DIAGNOSIS — I480 Paroxysmal atrial fibrillation: Secondary | ICD-10-CM | POA: Diagnosis not present

## 2021-09-07 DIAGNOSIS — E785 Hyperlipidemia, unspecified: Secondary | ICD-10-CM | POA: Diagnosis not present

## 2021-09-07 DIAGNOSIS — E876 Hypokalemia: Secondary | ICD-10-CM | POA: Diagnosis not present

## 2021-09-07 DIAGNOSIS — I214 Non-ST elevation (NSTEMI) myocardial infarction: Secondary | ICD-10-CM | POA: Diagnosis not present

## 2021-09-07 DIAGNOSIS — I5033 Acute on chronic diastolic (congestive) heart failure: Secondary | ICD-10-CM | POA: Diagnosis not present

## 2021-09-07 DIAGNOSIS — R911 Solitary pulmonary nodule: Secondary | ICD-10-CM | POA: Diagnosis not present

## 2021-09-07 DIAGNOSIS — G4733 Obstructive sleep apnea (adult) (pediatric): Secondary | ICD-10-CM | POA: Diagnosis not present

## 2021-09-07 DIAGNOSIS — J439 Emphysema, unspecified: Secondary | ICD-10-CM | POA: Diagnosis not present

## 2021-09-07 DIAGNOSIS — E440 Moderate protein-calorie malnutrition: Secondary | ICD-10-CM | POA: Diagnosis not present

## 2021-09-07 DIAGNOSIS — J9621 Acute and chronic respiratory failure with hypoxia: Secondary | ICD-10-CM | POA: Diagnosis not present

## 2021-09-07 DIAGNOSIS — F419 Anxiety disorder, unspecified: Secondary | ICD-10-CM | POA: Diagnosis not present

## 2021-09-07 DIAGNOSIS — I890 Lymphedema, not elsewhere classified: Secondary | ICD-10-CM | POA: Diagnosis not present

## 2021-09-07 DIAGNOSIS — F17210 Nicotine dependence, cigarettes, uncomplicated: Secondary | ICD-10-CM | POA: Diagnosis not present

## 2021-09-07 DIAGNOSIS — J9622 Acute and chronic respiratory failure with hypercapnia: Secondary | ICD-10-CM | POA: Diagnosis not present

## 2021-09-09 DIAGNOSIS — I2510 Atherosclerotic heart disease of native coronary artery without angina pectoris: Secondary | ICD-10-CM | POA: Diagnosis not present

## 2021-09-09 DIAGNOSIS — I63513 Cerebral infarction due to unspecified occlusion or stenosis of bilateral middle cerebral arteries: Secondary | ICD-10-CM | POA: Diagnosis not present

## 2021-09-09 NOTE — Telephone Encounter (Signed)
Transition Care Management Unsuccessful Follow-up Telephone Call ? ?Date of discharge and from where:  09/04/21 - [facility] - respiratory Failure ? ?Attempts:  2nd Attempt ? ?Reason for unsuccessful TCM follow-up call:  Left voice message ? ?  ?

## 2021-09-11 DIAGNOSIS — E669 Obesity, unspecified: Secondary | ICD-10-CM | POA: Diagnosis not present

## 2021-09-11 DIAGNOSIS — E1140 Type 2 diabetes mellitus with diabetic neuropathy, unspecified: Secondary | ICD-10-CM | POA: Diagnosis not present

## 2021-09-11 DIAGNOSIS — I4892 Unspecified atrial flutter: Secondary | ICD-10-CM | POA: Diagnosis not present

## 2021-09-11 DIAGNOSIS — E876 Hypokalemia: Secondary | ICD-10-CM | POA: Diagnosis not present

## 2021-09-11 DIAGNOSIS — R32 Unspecified urinary incontinence: Secondary | ICD-10-CM | POA: Diagnosis not present

## 2021-09-11 DIAGNOSIS — E1165 Type 2 diabetes mellitus with hyperglycemia: Secondary | ICD-10-CM | POA: Diagnosis not present

## 2021-09-11 DIAGNOSIS — I110 Hypertensive heart disease with heart failure: Secondary | ICD-10-CM | POA: Diagnosis not present

## 2021-09-11 DIAGNOSIS — E785 Hyperlipidemia, unspecified: Secondary | ICD-10-CM | POA: Diagnosis not present

## 2021-09-11 DIAGNOSIS — F419 Anxiety disorder, unspecified: Secondary | ICD-10-CM | POA: Diagnosis not present

## 2021-09-11 DIAGNOSIS — F17210 Nicotine dependence, cigarettes, uncomplicated: Secondary | ICD-10-CM | POA: Diagnosis not present

## 2021-09-11 DIAGNOSIS — I700 Atherosclerosis of aorta: Secondary | ICD-10-CM | POA: Diagnosis not present

## 2021-09-11 DIAGNOSIS — K219 Gastro-esophageal reflux disease without esophagitis: Secondary | ICD-10-CM | POA: Diagnosis not present

## 2021-09-11 DIAGNOSIS — I480 Paroxysmal atrial fibrillation: Secondary | ICD-10-CM | POA: Diagnosis not present

## 2021-09-11 DIAGNOSIS — I214 Non-ST elevation (NSTEMI) myocardial infarction: Secondary | ICD-10-CM | POA: Diagnosis not present

## 2021-09-11 DIAGNOSIS — G4733 Obstructive sleep apnea (adult) (pediatric): Secondary | ICD-10-CM | POA: Diagnosis not present

## 2021-09-11 DIAGNOSIS — I890 Lymphedema, not elsewhere classified: Secondary | ICD-10-CM | POA: Diagnosis not present

## 2021-09-11 DIAGNOSIS — J9621 Acute and chronic respiratory failure with hypoxia: Secondary | ICD-10-CM | POA: Diagnosis not present

## 2021-09-11 DIAGNOSIS — I2510 Atherosclerotic heart disease of native coronary artery without angina pectoris: Secondary | ICD-10-CM | POA: Diagnosis not present

## 2021-09-11 DIAGNOSIS — J9622 Acute and chronic respiratory failure with hypercapnia: Secondary | ICD-10-CM | POA: Diagnosis not present

## 2021-09-11 DIAGNOSIS — E440 Moderate protein-calorie malnutrition: Secondary | ICD-10-CM | POA: Diagnosis not present

## 2021-09-11 DIAGNOSIS — J439 Emphysema, unspecified: Secondary | ICD-10-CM | POA: Diagnosis not present

## 2021-09-11 DIAGNOSIS — R911 Solitary pulmonary nodule: Secondary | ICD-10-CM | POA: Diagnosis not present

## 2021-09-11 DIAGNOSIS — I5033 Acute on chronic diastolic (congestive) heart failure: Secondary | ICD-10-CM | POA: Diagnosis not present

## 2021-09-11 DIAGNOSIS — F32A Depression, unspecified: Secondary | ICD-10-CM | POA: Diagnosis not present

## 2021-09-13 ENCOUNTER — Ambulatory Visit: Payer: Medicare HMO | Admitting: Internal Medicine

## 2021-09-16 DIAGNOSIS — I480 Paroxysmal atrial fibrillation: Secondary | ICD-10-CM | POA: Diagnosis not present

## 2021-09-16 DIAGNOSIS — J439 Emphysema, unspecified: Secondary | ICD-10-CM | POA: Diagnosis not present

## 2021-09-16 DIAGNOSIS — I4892 Unspecified atrial flutter: Secondary | ICD-10-CM | POA: Diagnosis not present

## 2021-09-16 DIAGNOSIS — I110 Hypertensive heart disease with heart failure: Secondary | ICD-10-CM | POA: Diagnosis not present

## 2021-09-16 DIAGNOSIS — R32 Unspecified urinary incontinence: Secondary | ICD-10-CM | POA: Diagnosis not present

## 2021-09-16 DIAGNOSIS — F419 Anxiety disorder, unspecified: Secondary | ICD-10-CM | POA: Diagnosis not present

## 2021-09-16 DIAGNOSIS — J9622 Acute and chronic respiratory failure with hypercapnia: Secondary | ICD-10-CM | POA: Diagnosis not present

## 2021-09-16 DIAGNOSIS — I890 Lymphedema, not elsewhere classified: Secondary | ICD-10-CM | POA: Diagnosis not present

## 2021-09-16 DIAGNOSIS — G4733 Obstructive sleep apnea (adult) (pediatric): Secondary | ICD-10-CM | POA: Diagnosis not present

## 2021-09-16 DIAGNOSIS — R911 Solitary pulmonary nodule: Secondary | ICD-10-CM | POA: Diagnosis not present

## 2021-09-16 DIAGNOSIS — E1165 Type 2 diabetes mellitus with hyperglycemia: Secondary | ICD-10-CM | POA: Diagnosis not present

## 2021-09-16 DIAGNOSIS — E876 Hypokalemia: Secondary | ICD-10-CM | POA: Diagnosis not present

## 2021-09-16 DIAGNOSIS — J9621 Acute and chronic respiratory failure with hypoxia: Secondary | ICD-10-CM | POA: Diagnosis not present

## 2021-09-16 DIAGNOSIS — I5033 Acute on chronic diastolic (congestive) heart failure: Secondary | ICD-10-CM | POA: Diagnosis not present

## 2021-09-16 DIAGNOSIS — F17210 Nicotine dependence, cigarettes, uncomplicated: Secondary | ICD-10-CM | POA: Diagnosis not present

## 2021-09-16 DIAGNOSIS — I700 Atherosclerosis of aorta: Secondary | ICD-10-CM | POA: Diagnosis not present

## 2021-09-16 DIAGNOSIS — E669 Obesity, unspecified: Secondary | ICD-10-CM | POA: Diagnosis not present

## 2021-09-16 DIAGNOSIS — E1140 Type 2 diabetes mellitus with diabetic neuropathy, unspecified: Secondary | ICD-10-CM | POA: Diagnosis not present

## 2021-09-16 DIAGNOSIS — E440 Moderate protein-calorie malnutrition: Secondary | ICD-10-CM | POA: Diagnosis not present

## 2021-09-16 DIAGNOSIS — I2510 Atherosclerotic heart disease of native coronary artery without angina pectoris: Secondary | ICD-10-CM | POA: Diagnosis not present

## 2021-09-16 DIAGNOSIS — K219 Gastro-esophageal reflux disease without esophagitis: Secondary | ICD-10-CM | POA: Diagnosis not present

## 2021-09-16 DIAGNOSIS — F32A Depression, unspecified: Secondary | ICD-10-CM | POA: Diagnosis not present

## 2021-09-16 DIAGNOSIS — I214 Non-ST elevation (NSTEMI) myocardial infarction: Secondary | ICD-10-CM | POA: Diagnosis not present

## 2021-09-16 DIAGNOSIS — E785 Hyperlipidemia, unspecified: Secondary | ICD-10-CM | POA: Diagnosis not present

## 2021-09-18 DIAGNOSIS — I480 Paroxysmal atrial fibrillation: Secondary | ICD-10-CM | POA: Diagnosis not present

## 2021-09-18 DIAGNOSIS — I4892 Unspecified atrial flutter: Secondary | ICD-10-CM | POA: Diagnosis not present

## 2021-09-18 DIAGNOSIS — J439 Emphysema, unspecified: Secondary | ICD-10-CM | POA: Diagnosis not present

## 2021-09-18 DIAGNOSIS — I700 Atherosclerosis of aorta: Secondary | ICD-10-CM | POA: Diagnosis not present

## 2021-09-18 DIAGNOSIS — F32A Depression, unspecified: Secondary | ICD-10-CM | POA: Diagnosis not present

## 2021-09-18 DIAGNOSIS — E785 Hyperlipidemia, unspecified: Secondary | ICD-10-CM | POA: Diagnosis not present

## 2021-09-18 DIAGNOSIS — E440 Moderate protein-calorie malnutrition: Secondary | ICD-10-CM | POA: Diagnosis not present

## 2021-09-18 DIAGNOSIS — F419 Anxiety disorder, unspecified: Secondary | ICD-10-CM | POA: Diagnosis not present

## 2021-09-18 DIAGNOSIS — R32 Unspecified urinary incontinence: Secondary | ICD-10-CM | POA: Diagnosis not present

## 2021-09-18 DIAGNOSIS — E1140 Type 2 diabetes mellitus with diabetic neuropathy, unspecified: Secondary | ICD-10-CM | POA: Diagnosis not present

## 2021-09-18 DIAGNOSIS — I890 Lymphedema, not elsewhere classified: Secondary | ICD-10-CM | POA: Diagnosis not present

## 2021-09-18 DIAGNOSIS — I110 Hypertensive heart disease with heart failure: Secondary | ICD-10-CM | POA: Diagnosis not present

## 2021-09-18 DIAGNOSIS — G4733 Obstructive sleep apnea (adult) (pediatric): Secondary | ICD-10-CM | POA: Diagnosis not present

## 2021-09-18 DIAGNOSIS — I2510 Atherosclerotic heart disease of native coronary artery without angina pectoris: Secondary | ICD-10-CM | POA: Diagnosis not present

## 2021-09-18 DIAGNOSIS — E1165 Type 2 diabetes mellitus with hyperglycemia: Secondary | ICD-10-CM | POA: Diagnosis not present

## 2021-09-18 DIAGNOSIS — K219 Gastro-esophageal reflux disease without esophagitis: Secondary | ICD-10-CM | POA: Diagnosis not present

## 2021-09-18 DIAGNOSIS — J9621 Acute and chronic respiratory failure with hypoxia: Secondary | ICD-10-CM | POA: Diagnosis not present

## 2021-09-18 DIAGNOSIS — E669 Obesity, unspecified: Secondary | ICD-10-CM | POA: Diagnosis not present

## 2021-09-18 DIAGNOSIS — I5033 Acute on chronic diastolic (congestive) heart failure: Secondary | ICD-10-CM | POA: Diagnosis not present

## 2021-09-18 DIAGNOSIS — I214 Non-ST elevation (NSTEMI) myocardial infarction: Secondary | ICD-10-CM | POA: Diagnosis not present

## 2021-09-18 DIAGNOSIS — E876 Hypokalemia: Secondary | ICD-10-CM | POA: Diagnosis not present

## 2021-09-18 DIAGNOSIS — F17210 Nicotine dependence, cigarettes, uncomplicated: Secondary | ICD-10-CM | POA: Diagnosis not present

## 2021-09-18 DIAGNOSIS — R911 Solitary pulmonary nodule: Secondary | ICD-10-CM | POA: Diagnosis not present

## 2021-09-18 DIAGNOSIS — J9622 Acute and chronic respiratory failure with hypercapnia: Secondary | ICD-10-CM | POA: Diagnosis not present

## 2021-09-19 ENCOUNTER — Telehealth: Payer: Self-pay | Admitting: *Deleted

## 2021-09-19 DIAGNOSIS — R911 Solitary pulmonary nodule: Secondary | ICD-10-CM | POA: Diagnosis not present

## 2021-09-19 DIAGNOSIS — J9621 Acute and chronic respiratory failure with hypoxia: Secondary | ICD-10-CM | POA: Diagnosis not present

## 2021-09-19 DIAGNOSIS — E669 Obesity, unspecified: Secondary | ICD-10-CM | POA: Diagnosis not present

## 2021-09-19 DIAGNOSIS — J9622 Acute and chronic respiratory failure with hypercapnia: Secondary | ICD-10-CM | POA: Diagnosis not present

## 2021-09-19 DIAGNOSIS — I110 Hypertensive heart disease with heart failure: Secondary | ICD-10-CM | POA: Diagnosis not present

## 2021-09-19 DIAGNOSIS — K219 Gastro-esophageal reflux disease without esophagitis: Secondary | ICD-10-CM | POA: Diagnosis not present

## 2021-09-19 DIAGNOSIS — I5033 Acute on chronic diastolic (congestive) heart failure: Secondary | ICD-10-CM | POA: Diagnosis not present

## 2021-09-19 DIAGNOSIS — G4733 Obstructive sleep apnea (adult) (pediatric): Secondary | ICD-10-CM | POA: Diagnosis not present

## 2021-09-19 DIAGNOSIS — E1140 Type 2 diabetes mellitus with diabetic neuropathy, unspecified: Secondary | ICD-10-CM | POA: Diagnosis not present

## 2021-09-19 DIAGNOSIS — F32A Depression, unspecified: Secondary | ICD-10-CM | POA: Diagnosis not present

## 2021-09-19 DIAGNOSIS — I700 Atherosclerosis of aorta: Secondary | ICD-10-CM | POA: Diagnosis not present

## 2021-09-19 DIAGNOSIS — J439 Emphysema, unspecified: Secondary | ICD-10-CM | POA: Diagnosis not present

## 2021-09-19 DIAGNOSIS — I480 Paroxysmal atrial fibrillation: Secondary | ICD-10-CM | POA: Diagnosis not present

## 2021-09-19 DIAGNOSIS — I214 Non-ST elevation (NSTEMI) myocardial infarction: Secondary | ICD-10-CM | POA: Diagnosis not present

## 2021-09-19 DIAGNOSIS — E440 Moderate protein-calorie malnutrition: Secondary | ICD-10-CM | POA: Diagnosis not present

## 2021-09-19 DIAGNOSIS — F419 Anxiety disorder, unspecified: Secondary | ICD-10-CM | POA: Diagnosis not present

## 2021-09-19 DIAGNOSIS — I2510 Atherosclerotic heart disease of native coronary artery without angina pectoris: Secondary | ICD-10-CM | POA: Diagnosis not present

## 2021-09-19 DIAGNOSIS — F17210 Nicotine dependence, cigarettes, uncomplicated: Secondary | ICD-10-CM | POA: Diagnosis not present

## 2021-09-19 DIAGNOSIS — E785 Hyperlipidemia, unspecified: Secondary | ICD-10-CM | POA: Diagnosis not present

## 2021-09-19 DIAGNOSIS — R32 Unspecified urinary incontinence: Secondary | ICD-10-CM | POA: Diagnosis not present

## 2021-09-19 DIAGNOSIS — I890 Lymphedema, not elsewhere classified: Secondary | ICD-10-CM | POA: Diagnosis not present

## 2021-09-19 DIAGNOSIS — I4892 Unspecified atrial flutter: Secondary | ICD-10-CM | POA: Diagnosis not present

## 2021-09-19 DIAGNOSIS — E876 Hypokalemia: Secondary | ICD-10-CM | POA: Diagnosis not present

## 2021-09-19 DIAGNOSIS — E1165 Type 2 diabetes mellitus with hyperglycemia: Secondary | ICD-10-CM | POA: Diagnosis not present

## 2021-09-19 NOTE — Telephone Encounter (Signed)
FYI: TC from [facility] w/ [facility] ?Seeing pt today, BP elevated 161/76, pt had just taking medications ?He was a little agitated about something ?Also gave VOs for plane of frequency ?

## 2021-09-20 DIAGNOSIS — R32 Unspecified urinary incontinence: Secondary | ICD-10-CM | POA: Diagnosis not present

## 2021-09-20 DIAGNOSIS — I2510 Atherosclerotic heart disease of native coronary artery without angina pectoris: Secondary | ICD-10-CM | POA: Diagnosis not present

## 2021-09-20 DIAGNOSIS — I4892 Unspecified atrial flutter: Secondary | ICD-10-CM | POA: Diagnosis not present

## 2021-09-20 DIAGNOSIS — R911 Solitary pulmonary nodule: Secondary | ICD-10-CM | POA: Diagnosis not present

## 2021-09-20 DIAGNOSIS — E440 Moderate protein-calorie malnutrition: Secondary | ICD-10-CM | POA: Diagnosis not present

## 2021-09-20 DIAGNOSIS — I890 Lymphedema, not elsewhere classified: Secondary | ICD-10-CM | POA: Diagnosis not present

## 2021-09-20 DIAGNOSIS — F17210 Nicotine dependence, cigarettes, uncomplicated: Secondary | ICD-10-CM | POA: Diagnosis not present

## 2021-09-20 DIAGNOSIS — E669 Obesity, unspecified: Secondary | ICD-10-CM | POA: Diagnosis not present

## 2021-09-20 DIAGNOSIS — F419 Anxiety disorder, unspecified: Secondary | ICD-10-CM | POA: Diagnosis not present

## 2021-09-20 DIAGNOSIS — E876 Hypokalemia: Secondary | ICD-10-CM | POA: Diagnosis not present

## 2021-09-20 DIAGNOSIS — J9622 Acute and chronic respiratory failure with hypercapnia: Secondary | ICD-10-CM | POA: Diagnosis not present

## 2021-09-20 DIAGNOSIS — E785 Hyperlipidemia, unspecified: Secondary | ICD-10-CM | POA: Diagnosis not present

## 2021-09-20 DIAGNOSIS — I110 Hypertensive heart disease with heart failure: Secondary | ICD-10-CM | POA: Diagnosis not present

## 2021-09-20 DIAGNOSIS — E1140 Type 2 diabetes mellitus with diabetic neuropathy, unspecified: Secondary | ICD-10-CM | POA: Diagnosis not present

## 2021-09-20 DIAGNOSIS — I480 Paroxysmal atrial fibrillation: Secondary | ICD-10-CM | POA: Diagnosis not present

## 2021-09-20 DIAGNOSIS — F32A Depression, unspecified: Secondary | ICD-10-CM | POA: Diagnosis not present

## 2021-09-20 DIAGNOSIS — G4733 Obstructive sleep apnea (adult) (pediatric): Secondary | ICD-10-CM | POA: Diagnosis not present

## 2021-09-20 DIAGNOSIS — E1165 Type 2 diabetes mellitus with hyperglycemia: Secondary | ICD-10-CM | POA: Diagnosis not present

## 2021-09-20 DIAGNOSIS — K219 Gastro-esophageal reflux disease without esophagitis: Secondary | ICD-10-CM | POA: Diagnosis not present

## 2021-09-20 DIAGNOSIS — I214 Non-ST elevation (NSTEMI) myocardial infarction: Secondary | ICD-10-CM | POA: Diagnosis not present

## 2021-09-20 DIAGNOSIS — I700 Atherosclerosis of aorta: Secondary | ICD-10-CM | POA: Diagnosis not present

## 2021-09-20 DIAGNOSIS — I5033 Acute on chronic diastolic (congestive) heart failure: Secondary | ICD-10-CM | POA: Diagnosis not present

## 2021-09-20 DIAGNOSIS — J439 Emphysema, unspecified: Secondary | ICD-10-CM | POA: Diagnosis not present

## 2021-09-20 DIAGNOSIS — J9621 Acute and chronic respiratory failure with hypoxia: Secondary | ICD-10-CM | POA: Diagnosis not present

## 2021-09-23 ENCOUNTER — Ambulatory Visit: Payer: Medicare HMO | Admitting: Pulmonary Disease

## 2021-09-24 ENCOUNTER — Ambulatory Visit: Payer: Medicare HMO

## 2021-09-25 DIAGNOSIS — I890 Lymphedema, not elsewhere classified: Secondary | ICD-10-CM | POA: Diagnosis not present

## 2021-09-25 DIAGNOSIS — R911 Solitary pulmonary nodule: Secondary | ICD-10-CM | POA: Diagnosis not present

## 2021-09-25 DIAGNOSIS — E669 Obesity, unspecified: Secondary | ICD-10-CM | POA: Diagnosis not present

## 2021-09-25 DIAGNOSIS — E876 Hypokalemia: Secondary | ICD-10-CM | POA: Diagnosis not present

## 2021-09-25 DIAGNOSIS — F17210 Nicotine dependence, cigarettes, uncomplicated: Secondary | ICD-10-CM | POA: Diagnosis not present

## 2021-09-25 DIAGNOSIS — E785 Hyperlipidemia, unspecified: Secondary | ICD-10-CM | POA: Diagnosis not present

## 2021-09-25 DIAGNOSIS — I700 Atherosclerosis of aorta: Secondary | ICD-10-CM | POA: Diagnosis not present

## 2021-09-25 DIAGNOSIS — I5033 Acute on chronic diastolic (congestive) heart failure: Secondary | ICD-10-CM | POA: Diagnosis not present

## 2021-09-25 DIAGNOSIS — I2510 Atherosclerotic heart disease of native coronary artery without angina pectoris: Secondary | ICD-10-CM | POA: Diagnosis not present

## 2021-09-25 DIAGNOSIS — I480 Paroxysmal atrial fibrillation: Secondary | ICD-10-CM | POA: Diagnosis not present

## 2021-09-25 DIAGNOSIS — J439 Emphysema, unspecified: Secondary | ICD-10-CM | POA: Diagnosis not present

## 2021-09-25 DIAGNOSIS — J9622 Acute and chronic respiratory failure with hypercapnia: Secondary | ICD-10-CM | POA: Diagnosis not present

## 2021-09-25 DIAGNOSIS — R32 Unspecified urinary incontinence: Secondary | ICD-10-CM | POA: Diagnosis not present

## 2021-09-25 DIAGNOSIS — I214 Non-ST elevation (NSTEMI) myocardial infarction: Secondary | ICD-10-CM | POA: Diagnosis not present

## 2021-09-25 DIAGNOSIS — J9621 Acute and chronic respiratory failure with hypoxia: Secondary | ICD-10-CM | POA: Diagnosis not present

## 2021-09-25 DIAGNOSIS — E1140 Type 2 diabetes mellitus with diabetic neuropathy, unspecified: Secondary | ICD-10-CM | POA: Diagnosis not present

## 2021-09-25 DIAGNOSIS — F32A Depression, unspecified: Secondary | ICD-10-CM | POA: Diagnosis not present

## 2021-09-25 DIAGNOSIS — I4892 Unspecified atrial flutter: Secondary | ICD-10-CM | POA: Diagnosis not present

## 2021-09-25 DIAGNOSIS — F419 Anxiety disorder, unspecified: Secondary | ICD-10-CM | POA: Diagnosis not present

## 2021-09-25 DIAGNOSIS — E1165 Type 2 diabetes mellitus with hyperglycemia: Secondary | ICD-10-CM | POA: Diagnosis not present

## 2021-09-25 DIAGNOSIS — E440 Moderate protein-calorie malnutrition: Secondary | ICD-10-CM | POA: Diagnosis not present

## 2021-09-25 DIAGNOSIS — K219 Gastro-esophageal reflux disease without esophagitis: Secondary | ICD-10-CM | POA: Diagnosis not present

## 2021-09-25 DIAGNOSIS — G4733 Obstructive sleep apnea (adult) (pediatric): Secondary | ICD-10-CM | POA: Diagnosis not present

## 2021-09-25 DIAGNOSIS — I110 Hypertensive heart disease with heart failure: Secondary | ICD-10-CM | POA: Diagnosis not present

## 2021-09-26 ENCOUNTER — Ambulatory Visit: Payer: Medicare HMO

## 2021-09-26 DIAGNOSIS — K219 Gastro-esophageal reflux disease without esophagitis: Secondary | ICD-10-CM | POA: Diagnosis not present

## 2021-09-26 DIAGNOSIS — F17210 Nicotine dependence, cigarettes, uncomplicated: Secondary | ICD-10-CM | POA: Diagnosis not present

## 2021-09-26 DIAGNOSIS — E1140 Type 2 diabetes mellitus with diabetic neuropathy, unspecified: Secondary | ICD-10-CM | POA: Diagnosis not present

## 2021-09-26 DIAGNOSIS — I110 Hypertensive heart disease with heart failure: Secondary | ICD-10-CM | POA: Diagnosis not present

## 2021-09-26 DIAGNOSIS — E1165 Type 2 diabetes mellitus with hyperglycemia: Secondary | ICD-10-CM | POA: Diagnosis not present

## 2021-09-26 DIAGNOSIS — G4733 Obstructive sleep apnea (adult) (pediatric): Secondary | ICD-10-CM | POA: Diagnosis not present

## 2021-09-26 DIAGNOSIS — I2510 Atherosclerotic heart disease of native coronary artery without angina pectoris: Secondary | ICD-10-CM | POA: Diagnosis not present

## 2021-09-26 DIAGNOSIS — I700 Atherosclerosis of aorta: Secondary | ICD-10-CM | POA: Diagnosis not present

## 2021-09-26 DIAGNOSIS — I4892 Unspecified atrial flutter: Secondary | ICD-10-CM | POA: Diagnosis not present

## 2021-09-26 DIAGNOSIS — E785 Hyperlipidemia, unspecified: Secondary | ICD-10-CM | POA: Diagnosis not present

## 2021-09-26 DIAGNOSIS — I214 Non-ST elevation (NSTEMI) myocardial infarction: Secondary | ICD-10-CM | POA: Diagnosis not present

## 2021-09-26 DIAGNOSIS — E440 Moderate protein-calorie malnutrition: Secondary | ICD-10-CM | POA: Diagnosis not present

## 2021-09-26 DIAGNOSIS — J9622 Acute and chronic respiratory failure with hypercapnia: Secondary | ICD-10-CM | POA: Diagnosis not present

## 2021-09-26 DIAGNOSIS — J439 Emphysema, unspecified: Secondary | ICD-10-CM | POA: Diagnosis not present

## 2021-09-26 DIAGNOSIS — E669 Obesity, unspecified: Secondary | ICD-10-CM | POA: Diagnosis not present

## 2021-09-26 DIAGNOSIS — J9621 Acute and chronic respiratory failure with hypoxia: Secondary | ICD-10-CM | POA: Diagnosis not present

## 2021-09-26 DIAGNOSIS — F32A Depression, unspecified: Secondary | ICD-10-CM | POA: Diagnosis not present

## 2021-09-26 DIAGNOSIS — E876 Hypokalemia: Secondary | ICD-10-CM | POA: Diagnosis not present

## 2021-09-26 DIAGNOSIS — F419 Anxiety disorder, unspecified: Secondary | ICD-10-CM | POA: Diagnosis not present

## 2021-09-26 DIAGNOSIS — I480 Paroxysmal atrial fibrillation: Secondary | ICD-10-CM | POA: Diagnosis not present

## 2021-09-26 DIAGNOSIS — R32 Unspecified urinary incontinence: Secondary | ICD-10-CM | POA: Diagnosis not present

## 2021-09-26 DIAGNOSIS — I890 Lymphedema, not elsewhere classified: Secondary | ICD-10-CM | POA: Diagnosis not present

## 2021-09-26 DIAGNOSIS — I5033 Acute on chronic diastolic (congestive) heart failure: Secondary | ICD-10-CM | POA: Diagnosis not present

## 2021-09-26 DIAGNOSIS — R911 Solitary pulmonary nodule: Secondary | ICD-10-CM | POA: Diagnosis not present

## 2021-09-27 ENCOUNTER — Encounter: Payer: Self-pay | Admitting: Family

## 2021-09-27 ENCOUNTER — Ambulatory Visit (INDEPENDENT_AMBULATORY_CARE_PROVIDER_SITE_OTHER): Payer: Medicare HMO

## 2021-09-27 ENCOUNTER — Ambulatory Visit (INDEPENDENT_AMBULATORY_CARE_PROVIDER_SITE_OTHER): Payer: Medicare HMO | Admitting: Family

## 2021-09-27 ENCOUNTER — Ambulatory Visit: Payer: Medicare HMO

## 2021-09-27 ENCOUNTER — Telehealth: Payer: Self-pay | Admitting: Family

## 2021-09-27 VITALS — BP 155/72 | HR 65 | Temp 98.4°F | Ht 69.0 in | Wt 184.8 lb

## 2021-09-27 DIAGNOSIS — J9611 Chronic respiratory failure with hypoxia: Secondary | ICD-10-CM

## 2021-09-27 DIAGNOSIS — I509 Heart failure, unspecified: Secondary | ICD-10-CM

## 2021-09-27 DIAGNOSIS — Z09 Encounter for follow-up examination after completed treatment for conditions other than malignant neoplasm: Secondary | ICD-10-CM

## 2021-09-27 DIAGNOSIS — I10 Essential (primary) hypertension: Secondary | ICD-10-CM

## 2021-09-27 DIAGNOSIS — R69 Illness, unspecified: Secondary | ICD-10-CM | POA: Diagnosis not present

## 2021-09-27 DIAGNOSIS — I517 Cardiomegaly: Secondary | ICD-10-CM | POA: Diagnosis not present

## 2021-09-27 DIAGNOSIS — F419 Anxiety disorder, unspecified: Secondary | ICD-10-CM

## 2021-09-27 DIAGNOSIS — F1320 Sedative, hypnotic or anxiolytic dependence, uncomplicated: Secondary | ICD-10-CM

## 2021-09-27 MED ORDER — ALPRAZOLAM 0.5 MG PO TABS: 0.5000 mg | ORAL_TABLET | Freq: Two times a day (BID) | ORAL | 2 refills | Status: DC | PRN

## 2021-09-27 NOTE — Patient Instructions (Signed)
Chronic Respiratory Failure ? ?Respiratory failure is a condition in which the lungs do not work well and the breathing (respiratory) system fails. When respiratory failure occurs, it becomes difficult for the lungs to get enough oxygen or to eliminate carbon dioxide (or both). If the lungs do not work properly, the heart, brain, and other body systems do not get enough oxygen. Respiratory failure is life-threatening if not treated. ?Respiratory failure can be acute or chronic. Acute respiratory failure is sudden and severe and requires emergency medical treatment. Chronic respiratory failure happens over time--months to years--and is usually due to a medical condition that gets worse. ?What are the causes? ?This condition may be caused by any problem that affects the heart or lungs. Causes include: ?Lung or airway disease, such as: ?Chronic bronchitis and emphysema (COPD). ?Asthma. ?Cystic fibrosis. ?Pulmonary hypertension. ?Pulmonary fibrosis. This is scarring of the lung tissue. ?Infection, such as pneumonia. ?Nerve or muscle injury or diseases that make chest movements difficult, such as: ?Lou Gehrig's disease. ?Guillain-Barre syndrome. ?Stroke. ?Spinal cord injuries. ?Fluid in the lungs (pulmonary edema). This may be due to heart failure or lung injury. ?A blood clot in a lung (pulmonary embolism). ?Trauma to the chest that makes breathing difficult. This may include a collapsed lung (pneumothorax). ?What increases the risk? ?You are more likely to develop this condition if: ?You smoke or vape, have a history of smoking or vaping, or have exposure to secondhand smoke. ?You have a weak immune system. ?You have a family history of breathing problems or lung disease. ?You have sleep apnea. ?You have congestive heart failure. ?You are obese. ?You have been exposed to hazardous substances at work, such as chemicals, asbestos, industrial dyes, or chemical fumes. ?What are the signs or symptoms? ?Symptoms of this  condition include: ?Shortness of breath or difficulty breathing. ?A cough with or without mucus (sputum). ?Wheezing. ?Chest pain or tightness. ?A bluish color to the fingernail or toenail beds. ?Confusion. ?Drowsiness or feeling tired easily, especially with minimal activity. ?How is this diagnosed? ?This condition may be diagnosed based on: ?Your medical history. ?A physical exam. ?Other tests, such as: ?Imaging tests. These may include a chest X-ray or CT scan. ?Blood tests, such as an arterial blood gas test. This test is done to check if you have enough oxygen in your blood or if your carbon dioxide levels are too high. ?An electrocardiogram. This test records the electrical activity of your heart. ?An echocardiogram. This test uses sound waves to make an image of your heart. ?Pulmonary function tests. These help to determine if you have chronic lung disease. ?Bronchoscopy. During this test, your health care provider uses a tube with a camera to look inside your airways for signs of inflammation or other problems. ?How is this treated? ?Treatment for this condition depends on the cause. Treatment may include: ?Breathing in oxygen through a tube with prongs that sit in your nose (nasal cannula) or through a mask that fits over your face. ?Receiving noninvasive positive pressure ventilation, called CPAP or BIPAP. This is a method of breathing support in which a machine blows air into your lungs through a mask. ?Medicines to help with breathing, such as: ?Medicines that open up and relax air passages, such as bronchodilators. These may be given through a device that turns liquid medicines into a mist you can breathe in (nebulizer). These medicines help with breathing. ?Diuretics. These medicines get rid of extra fluid in your lungs, which can help you breathe better. ?Steroid medicines. These  decrease inflammation in the lungs. ?Antibiotic medicines. These may be given to treat a bacterial infection, such as  pneumonia. ?Blood thinners (anticoagulants) to treat blood clots in the lungs. ?Pulmonary rehabilitation. This is an exercise program that strengthens the muscles in your chest and helps you learn breathing techniques to manage your condition. ?Using a ventilator. This is a breathing machine that delivers oxygen to the lungs through a breathing tube that is put into the trachea. This machine is used when you can no longer breathe well enough on your own. ?Follow these instructions at home: ?Medicines ?Take over-the-counter and prescription medicines only as told by your health care provider. ?If you were prescribed an antibiotic medicine, take it as told by your health care provider. Do not stop taking the antibiotic even if you start to feel better. ?Lifestyle ?Do not use any products that contain nicotine or tobacco, such as cigarettes, e-cigarettes, and chewing tobacco. If you need help quitting, ask your health care provider. Avoid secondhand smoke. ?Avoid exposure to irritants that make your breathing problems worse. These include smoke, chemicals, and fumes. ?General instructions ?Use oxygen therapy and do pulmonary rehabilitation if directed by your health care provider. If you require home oxygen therapy, ask your health care provider whether you should purchase a pulse oximeter to measure your oxygen level at home. ?If you were given a CPAP machine, make sure that you understand how to use, clean, and care for the machine and that you use it as directed. ?Stay active, but balance activity with periods of rest. Exercise and physical activity will help you maintain your ability to do things you want to do. ?Stay up to date on all vaccines, especially pneumonia and yearly flu (influenza) vaccines. ?Avoid people who are sick and avoid crowded places during the flu season. ?Work with your health care provider to create a plan to help you deal with your condition. Follow this plan. ?Keep all follow-up visits as  told by your health care provider. This is important. ?Contact a health care provider if: ?Your shortness of breath gets worse and you cannot do the things you used to do. ?You have increased sputum, wheezing, coughing, or loss of energy. ?You are on oxygen therapy and you are starting to need more. ?You need to use your medicines more often. ?You have a fever of 100.4?F (38?C) or higher. ?Get help right away if: ?Your shortness of breath becomes suddenly or significantly worse. ?You develop chest pain, tightness, or pressure. ?You are unable to say more than a few words without having to catch your breath. ?Your lips, toenails, or fingernails are a bluish color. ?You become confused or difficult to wake up. ?These symptoms may represent a serious problem that is an emergency. Do not wait to see if the symptoms will go away. Get medical help right away. Call your local emergency services (911 in the [place] Do not drive yourself to the hospital. ?Summary ?Respiratory failure is a condition in which the lungs do not work well and the breathing system fails. ?This condition can be very serious and is often life-threatening. Chronic respiratory failure has many causes, including COPD, lung infections, and fluid in the lungs. Chronic respiratory failure is lifelong, but its acute symptoms can be very dangerous. ?This condition is diagnosed with specific tests and can be treated with medicines, oxygen, or both. ?Contact a health care provider if your shortness of breath gets worse, you develop chest pain or fever, or you need to use your  oxygen or medicines more often than before. ?This information is not intended to replace advice given to you by your health care provider. Make sure you discuss any questions you have with your health care provider. ?Document Revised: 01/02/2021 Document Reviewed: 07/01/2019 ?Elsevier Patient Education ? [employer]. ? ?

## 2021-09-27 NOTE — Telephone Encounter (Signed)
Patient refused visit

## 2021-09-27 NOTE — Progress Notes (Signed)
? ?Subjective:  ? ? Patient ID: Ronald [name], male    DOB: 02-06-1946, 76 y.o.   MRN: [id number] ? ?Chief Complaint  ?Patient presents with  ? Hospitalization Follow-up  ? ?Pt presents to the office today for hospital follow up. He went to the ED on 08/31/21 with dyspnea and hypoxia. He was started on Bipap. Chex-ray concerning for CAP vs CHF.  ? ?He was discharged on 09/04/21. He quit smoking 4 months ago.  ? ?He has follow up with Pulmonologist in April and Cardiologists.  ? ?He needs repeat CT scan for follow up on pulmonary nodule. He was discharge on Augmentin and steroids. He completed this.  ?Congestive Heart Failure ?Presents for follow-up visit. Associated symptoms include edema, fatigue, muscle weakness and shortness of breath. The symptoms have been stable.  ?Hypertension ?This is a chronic problem. The current episode started more than 1 year ago. The problem has been waxing and waning since onset. The problem is uncontrolled. Associated symptoms include anxiety, malaise/fatigue and shortness of breath. Pertinent negatives include no peripheral edema. Risk factors for coronary artery disease include diabetes mellitus, dyslipidemia, obesity, male gender and sedentary lifestyle. The current treatment provides moderate improvement.  ?Shortness of Breath ?This is a chronic problem. The current episode started more than 1 year ago. The problem occurs intermittently. The problem has been waxing and waning. Associated symptoms include rhinorrhea.  ?Anxiety ?Presents for follow-up visit. Symptoms include excessive worry, irritability, nervous/anxious behavior and shortness of breath.  ? ? ? ? ? ?Review of Systems  ?Constitutional:  Positive for fatigue, irritability and malaise/fatigue.  ?HENT:  Positive for rhinorrhea.   ?Respiratory:  Positive for shortness of breath.   ?Musculoskeletal:  Positive for muscle weakness.  ?Psychiatric/Behavioral:  The patient is nervous/anxious.   ?All other systems reviewed and  are negative. ? ?   ?Objective:  ? Physical Exam ?Vitals reviewed.  ?Constitutional:   ?   General: He is not in acute distress. ?   Appearance: He is well-developed. He is obese.  ?HENT:  ?   Head: Normocephalic.  ?   Right Ear: Tympanic membrane normal.  ?   Left Ear: Tympanic membrane normal.  ?Eyes:  ?   General:     ?   Right eye: No discharge.     ?   Left eye: No discharge.  ?   Pupils: Pupils are equal, round, and reactive to light.  ?Neck:  ?   Thyroid: No thyromegaly.  ?Cardiovascular:  ?   Rate and Rhythm: Normal rate and regular rhythm.  ?   Heart sounds: Normal heart sounds. No murmur heard. ?Pulmonary:  ?   Effort: Pulmonary effort is normal. No respiratory distress.  ?   Breath sounds: Wheezing and rhonchi present.  ?   Comments: 2 L of O2 ?Abdominal:  ?   General: Bowel sounds are normal. There is no distension.  ?   Palpations: Abdomen is soft.  ?   Tenderness: There is no abdominal tenderness.  ?Musculoskeletal:     ?   General: Swelling (bilateral arm) present. No tenderness. Normal range of motion.  ?   Cervical back: Normal range of motion and neck supple.  ?Skin: ?   General: Skin is warm and dry.  ?   Findings: No erythema or rash.  ?Neurological:  ?   Mental Status: He is alert and oriented to person, place, and time.  ?   Cranial Nerves: No cranial nerve deficit.  ?  Deep Tendon Reflexes: Reflexes are normal and symmetric.  ?Psychiatric:     ?   Behavior: Behavior normal.     ?   Thought Content: Thought content normal.     ?   Judgment: Judgment normal.  ? ? ? ? ?BP (!) 155/72   Pulse 65   Temp 98.4 ?F (36.9 ?C) (Temporal)   Ht 5' 9"  (1.753 m)   Wt 184 lb 12.8 oz (83.8 kg)   SpO2 (!) 88%   BMI 27.29 kg/m?  ? ?   ?Assessment & Plan:  ?[name] comes in today with chief complaint of Hospitalization Follow-up ? ? ?Diagnosis and orders addressed: ? ?1. Congestive heart failure, unspecified HF chronicity, unspecified heart failure type ([place]) ?- CMP14+EGFR ?- CBC with  Differential/Platelet ?- DG Chest 2 View ? ?2. Essential hypertension ?- CMP14+EGFR ?- CBC with Differential/Platelet ?- DG Chest 2 View ? ?3. Chronic respiratory failure with hypoxia (HCC) ?- CMP14+EGFR ?- CBC with Differential/Platelet ?- DG Chest 2 View ? ?4. Hospital discharge follow-up ?- CMP14+EGFR ?- CBC with Differential/Platelet ?- DG Chest 2 View ? ?5. Benzodiazepine dependence ([place]) ?- ALPRAZolam (XANAX) 0.5 MG tablet; Take 1 tablet (0.5 mg total) by mouth 2 (two) times daily as needed. for anxiety  Dispense: 60 tablet; Refill: 2 ? ?6. Anxiety ?- ALPRAZolam (XANAX) 0.5 MG tablet; Take 1 tablet (0.5 mg total) by mouth 2 (two) times daily as needed. for anxiety  Dispense: 60 tablet; Refill: 2 ? ? ?Labs pending ?Health Maintenance reviewed ?Diet and exercise encouraged ? ?Follow up plan: ?3 months  ? ?[name], FNP ? ? ?

## 2021-09-28 LAB — CBC WITH DIFFERENTIAL/PLATELET
Basophils Absolute: 0 10*3/uL (ref 0.0–0.2)
Basos: 1 %
EOS (ABSOLUTE): 0.2 10*3/uL (ref 0.0–0.4)
Eos: 3 %
Hematocrit: 36.7 % — ABNORMAL LOW (ref 37.5–51.0)
Hemoglobin: 11.9 g/dL — ABNORMAL LOW (ref 13.0–17.7)
Immature Grans (Abs): 0 10*3/uL (ref 0.0–0.1)
Immature Granulocytes: 0 %
Lymphocytes Absolute: 1.7 10*3/uL (ref 0.7–3.1)
Lymphs: 24 %
MCH: 26.3 pg — ABNORMAL LOW (ref 26.6–33.0)
MCHC: 32.4 g/dL (ref 31.5–35.7)
MCV: 81 fL (ref 79–97)
Monocytes Absolute: 0.8 10*3/uL (ref 0.1–0.9)
Monocytes: 12 %
Neutrophils Absolute: 4.3 10*3/uL (ref 1.4–7.0)
Neutrophils: 60 %
Platelets: 393 10*3/uL (ref 150–450)
RBC: 4.53 x10E6/uL (ref 4.14–5.80)
RDW: 13.5 % (ref 11.6–15.4)
WBC: 7 10*3/uL (ref 3.4–10.8)

## 2021-09-28 LAB — CMP14+EGFR
ALT: 10 IU/L (ref 0–44)
AST: 12 IU/L (ref 0–40)
Albumin/Globulin Ratio: 1.8 (ref 1.2–2.2)
Albumin: 4.1 g/dL (ref 3.7–4.7)
Alkaline Phosphatase: 92 IU/L (ref 44–121)
BUN/Creatinine Ratio: 10 (ref 10–24)
BUN: 12 mg/dL (ref 8–27)
Bilirubin Total: 0.4 mg/dL (ref 0.0–1.2)
CO2: 29 mmol/L (ref 20–29)
Calcium: 9.4 mg/dL (ref 8.6–10.2)
Chloride: 101 mmol/L (ref 96–106)
Creatinine, Ser: 1.2 mg/dL (ref 0.76–1.27)
Globulin, Total: 2.3 g/dL (ref 1.5–4.5)
Glucose: 162 mg/dL — ABNORMAL HIGH (ref 70–99)
Potassium: 4.3 mmol/L (ref 3.5–5.2)
Sodium: 144 mmol/L (ref 134–144)
Total Protein: 6.4 g/dL (ref 6.0–8.5)
eGFR: 64 mL/min/{1.73_m2} (ref >59)

## 2021-09-30 ENCOUNTER — Ambulatory Visit (INDEPENDENT_AMBULATORY_CARE_PROVIDER_SITE_OTHER): Payer: Medicare HMO

## 2021-09-30 DIAGNOSIS — I700 Atherosclerosis of aorta: Secondary | ICD-10-CM

## 2021-09-30 DIAGNOSIS — J9621 Acute and chronic respiratory failure with hypoxia: Secondary | ICD-10-CM | POA: Diagnosis not present

## 2021-09-30 DIAGNOSIS — E440 Moderate protein-calorie malnutrition: Secondary | ICD-10-CM

## 2021-09-30 DIAGNOSIS — I2510 Atherosclerotic heart disease of native coronary artery without angina pectoris: Secondary | ICD-10-CM

## 2021-09-30 DIAGNOSIS — I890 Lymphedema, not elsewhere classified: Secondary | ICD-10-CM

## 2021-09-30 DIAGNOSIS — I110 Hypertensive heart disease with heart failure: Secondary | ICD-10-CM | POA: Diagnosis not present

## 2021-09-30 DIAGNOSIS — I5033 Acute on chronic diastolic (congestive) heart failure: Secondary | ICD-10-CM | POA: Diagnosis not present

## 2021-09-30 DIAGNOSIS — I4892 Unspecified atrial flutter: Secondary | ICD-10-CM | POA: Diagnosis not present

## 2021-09-30 DIAGNOSIS — J9622 Acute and chronic respiratory failure with hypercapnia: Secondary | ICD-10-CM

## 2021-09-30 DIAGNOSIS — I214 Non-ST elevation (NSTEMI) myocardial infarction: Secondary | ICD-10-CM | POA: Diagnosis not present

## 2021-09-30 DIAGNOSIS — F32A Depression, unspecified: Secondary | ICD-10-CM

## 2021-09-30 DIAGNOSIS — R911 Solitary pulmonary nodule: Secondary | ICD-10-CM | POA: Diagnosis not present

## 2021-09-30 DIAGNOSIS — E785 Hyperlipidemia, unspecified: Secondary | ICD-10-CM | POA: Diagnosis not present

## 2021-09-30 DIAGNOSIS — J439 Emphysema, unspecified: Secondary | ICD-10-CM | POA: Diagnosis not present

## 2021-09-30 DIAGNOSIS — I480 Paroxysmal atrial fibrillation: Secondary | ICD-10-CM

## 2021-09-30 DIAGNOSIS — E1165 Type 2 diabetes mellitus with hyperglycemia: Secondary | ICD-10-CM

## 2021-09-30 DIAGNOSIS — K219 Gastro-esophageal reflux disease without esophagitis: Secondary | ICD-10-CM

## 2021-09-30 DIAGNOSIS — Z6826 Body mass index (BMI) 26.0-26.9, adult: Secondary | ICD-10-CM

## 2021-09-30 DIAGNOSIS — G4733 Obstructive sleep apnea (adult) (pediatric): Secondary | ICD-10-CM

## 2021-09-30 DIAGNOSIS — F17210 Nicotine dependence, cigarettes, uncomplicated: Secondary | ICD-10-CM

## 2021-09-30 DIAGNOSIS — E1140 Type 2 diabetes mellitus with diabetic neuropathy, unspecified: Secondary | ICD-10-CM | POA: Diagnosis not present

## 2021-09-30 DIAGNOSIS — E669 Obesity, unspecified: Secondary | ICD-10-CM

## 2021-09-30 DIAGNOSIS — R32 Unspecified urinary incontinence: Secondary | ICD-10-CM

## 2021-09-30 DIAGNOSIS — F419 Anxiety disorder, unspecified: Secondary | ICD-10-CM

## 2021-09-30 DIAGNOSIS — E876 Hypokalemia: Secondary | ICD-10-CM

## 2021-10-01 ENCOUNTER — Encounter: Payer: Self-pay | Admitting: Emergency Medicine

## 2021-10-01 DIAGNOSIS — J439 Emphysema, unspecified: Secondary | ICD-10-CM | POA: Diagnosis not present

## 2021-10-01 DIAGNOSIS — I214 Non-ST elevation (NSTEMI) myocardial infarction: Secondary | ICD-10-CM | POA: Diagnosis not present

## 2021-10-01 DIAGNOSIS — I890 Lymphedema, not elsewhere classified: Secondary | ICD-10-CM | POA: Diagnosis not present

## 2021-10-01 DIAGNOSIS — I700 Atherosclerosis of aorta: Secondary | ICD-10-CM | POA: Diagnosis not present

## 2021-10-01 DIAGNOSIS — E1140 Type 2 diabetes mellitus with diabetic neuropathy, unspecified: Secondary | ICD-10-CM | POA: Diagnosis not present

## 2021-10-01 DIAGNOSIS — E1165 Type 2 diabetes mellitus with hyperglycemia: Secondary | ICD-10-CM | POA: Diagnosis not present

## 2021-10-01 DIAGNOSIS — E785 Hyperlipidemia, unspecified: Secondary | ICD-10-CM | POA: Diagnosis not present

## 2021-10-01 DIAGNOSIS — J9621 Acute and chronic respiratory failure with hypoxia: Secondary | ICD-10-CM | POA: Diagnosis not present

## 2021-10-01 DIAGNOSIS — J9622 Acute and chronic respiratory failure with hypercapnia: Secondary | ICD-10-CM | POA: Diagnosis not present

## 2021-10-01 DIAGNOSIS — G4733 Obstructive sleep apnea (adult) (pediatric): Secondary | ICD-10-CM | POA: Diagnosis not present

## 2021-10-01 DIAGNOSIS — I4892 Unspecified atrial flutter: Secondary | ICD-10-CM | POA: Diagnosis not present

## 2021-10-01 DIAGNOSIS — F17210 Nicotine dependence, cigarettes, uncomplicated: Secondary | ICD-10-CM | POA: Diagnosis not present

## 2021-10-01 DIAGNOSIS — E669 Obesity, unspecified: Secondary | ICD-10-CM | POA: Diagnosis not present

## 2021-10-01 DIAGNOSIS — K219 Gastro-esophageal reflux disease without esophagitis: Secondary | ICD-10-CM | POA: Diagnosis not present

## 2021-10-01 DIAGNOSIS — I480 Paroxysmal atrial fibrillation: Secondary | ICD-10-CM | POA: Diagnosis not present

## 2021-10-01 DIAGNOSIS — I110 Hypertensive heart disease with heart failure: Secondary | ICD-10-CM | POA: Diagnosis not present

## 2021-10-01 DIAGNOSIS — I5033 Acute on chronic diastolic (congestive) heart failure: Secondary | ICD-10-CM | POA: Diagnosis not present

## 2021-10-01 DIAGNOSIS — E876 Hypokalemia: Secondary | ICD-10-CM | POA: Diagnosis not present

## 2021-10-01 DIAGNOSIS — E440 Moderate protein-calorie malnutrition: Secondary | ICD-10-CM | POA: Diagnosis not present

## 2021-10-01 DIAGNOSIS — I2510 Atherosclerotic heart disease of native coronary artery without angina pectoris: Secondary | ICD-10-CM | POA: Diagnosis not present

## 2021-10-01 DIAGNOSIS — F419 Anxiety disorder, unspecified: Secondary | ICD-10-CM | POA: Diagnosis not present

## 2021-10-01 DIAGNOSIS — R911 Solitary pulmonary nodule: Secondary | ICD-10-CM | POA: Diagnosis not present

## 2021-10-01 DIAGNOSIS — R32 Unspecified urinary incontinence: Secondary | ICD-10-CM | POA: Diagnosis not present

## 2021-10-01 DIAGNOSIS — F32A Depression, unspecified: Secondary | ICD-10-CM | POA: Diagnosis not present

## 2021-10-02 ENCOUNTER — Other Ambulatory Visit: Payer: Self-pay | Admitting: *Deleted

## 2021-10-02 DIAGNOSIS — I5030 Unspecified diastolic (congestive) heart failure: Secondary | ICD-10-CM | POA: Diagnosis not present

## 2021-10-03 DIAGNOSIS — R911 Solitary pulmonary nodule: Secondary | ICD-10-CM | POA: Diagnosis not present

## 2021-10-03 DIAGNOSIS — I110 Hypertensive heart disease with heart failure: Secondary | ICD-10-CM | POA: Diagnosis not present

## 2021-10-03 DIAGNOSIS — F17210 Nicotine dependence, cigarettes, uncomplicated: Secondary | ICD-10-CM | POA: Diagnosis not present

## 2021-10-03 DIAGNOSIS — I214 Non-ST elevation (NSTEMI) myocardial infarction: Secondary | ICD-10-CM | POA: Diagnosis not present

## 2021-10-03 DIAGNOSIS — J9622 Acute and chronic respiratory failure with hypercapnia: Secondary | ICD-10-CM | POA: Diagnosis not present

## 2021-10-03 DIAGNOSIS — J9621 Acute and chronic respiratory failure with hypoxia: Secondary | ICD-10-CM | POA: Diagnosis not present

## 2021-10-03 DIAGNOSIS — E669 Obesity, unspecified: Secondary | ICD-10-CM | POA: Diagnosis not present

## 2021-10-03 DIAGNOSIS — I700 Atherosclerosis of aorta: Secondary | ICD-10-CM | POA: Diagnosis not present

## 2021-10-03 DIAGNOSIS — J439 Emphysema, unspecified: Secondary | ICD-10-CM | POA: Diagnosis not present

## 2021-10-03 DIAGNOSIS — R32 Unspecified urinary incontinence: Secondary | ICD-10-CM | POA: Diagnosis not present

## 2021-10-03 DIAGNOSIS — I4892 Unspecified atrial flutter: Secondary | ICD-10-CM | POA: Diagnosis not present

## 2021-10-03 DIAGNOSIS — E876 Hypokalemia: Secondary | ICD-10-CM | POA: Diagnosis not present

## 2021-10-03 DIAGNOSIS — E440 Moderate protein-calorie malnutrition: Secondary | ICD-10-CM | POA: Diagnosis not present

## 2021-10-03 DIAGNOSIS — I480 Paroxysmal atrial fibrillation: Secondary | ICD-10-CM | POA: Diagnosis not present

## 2021-10-03 DIAGNOSIS — F32A Depression, unspecified: Secondary | ICD-10-CM | POA: Diagnosis not present

## 2021-10-03 DIAGNOSIS — I5033 Acute on chronic diastolic (congestive) heart failure: Secondary | ICD-10-CM | POA: Diagnosis not present

## 2021-10-03 DIAGNOSIS — G4733 Obstructive sleep apnea (adult) (pediatric): Secondary | ICD-10-CM | POA: Diagnosis not present

## 2021-10-03 DIAGNOSIS — E785 Hyperlipidemia, unspecified: Secondary | ICD-10-CM | POA: Diagnosis not present

## 2021-10-03 DIAGNOSIS — K219 Gastro-esophageal reflux disease without esophagitis: Secondary | ICD-10-CM | POA: Diagnosis not present

## 2021-10-03 DIAGNOSIS — I2510 Atherosclerotic heart disease of native coronary artery without angina pectoris: Secondary | ICD-10-CM | POA: Diagnosis not present

## 2021-10-03 DIAGNOSIS — E1140 Type 2 diabetes mellitus with diabetic neuropathy, unspecified: Secondary | ICD-10-CM | POA: Diagnosis not present

## 2021-10-03 DIAGNOSIS — E1165 Type 2 diabetes mellitus with hyperglycemia: Secondary | ICD-10-CM | POA: Diagnosis not present

## 2021-10-03 DIAGNOSIS — I890 Lymphedema, not elsewhere classified: Secondary | ICD-10-CM | POA: Diagnosis not present

## 2021-10-03 DIAGNOSIS — F419 Anxiety disorder, unspecified: Secondary | ICD-10-CM | POA: Diagnosis not present

## 2021-10-03 MED ORDER — ROSUVASTATIN CALCIUM 20 MG PO TABS: 20.0000 mg | ORAL_TABLET | Freq: Every day | ORAL | 1 refills | Status: DC

## 2021-10-03 MED ORDER — AMLODIPINE BESYLATE 5 MG PO TABS: 5.0000 mg | ORAL_TABLET | Freq: Every day | ORAL | 1 refills | Status: DC

## 2021-10-04 ENCOUNTER — Telehealth: Payer: Self-pay

## 2021-10-04 ENCOUNTER — Other Ambulatory Visit: Payer: Self-pay | Admitting: *Deleted

## 2021-10-04 DIAGNOSIS — J439 Emphysema, unspecified: Secondary | ICD-10-CM | POA: Diagnosis not present

## 2021-10-04 MED ORDER — NITROGLYCERIN 0.4 MG SL SUBL: 0.4000 mg | SUBLINGUAL_TABLET | SUBLINGUAL | 1 refills | Status: DC | PRN

## 2021-10-04 NOTE — Chronic Care Management (AMB) (Signed)
?  Care Management  ? ?Note ? ?10/04/2021 ?Name: Ronald [name] MRN: [id number] DOB: 07/30/1945 ? ?Ronald [name] is a 76 y.o. year old male who is a primary care patient of [name], FNP and is actively engaged with the care management team. I reached out to [name] by phone today to assist with re-scheduling a follow up visit with the RN Case Manager ? ?Follow up plan: ?Unsuccessful telephone outreach attempt made. A HIPAA compliant phone message was left for the patient providing contact information and requesting a return call.  ?The care management team will reach out to the patient again over the next 7 days.  ?If patient returns call to provider office, please advise to call [place]  at [phone] ? ?Noreene Larsson, RMA ?Care Guide, Embedded Care Coordination ?[facility]  Care Management  ?[place], [place] [address] ?Direct Dial: [phone] ?Museum/gallery conservator.[email or web address] ?Website: [email or web address]  ? ?

## 2021-10-07 DIAGNOSIS — E669 Obesity, unspecified: Secondary | ICD-10-CM | POA: Diagnosis not present

## 2021-10-07 DIAGNOSIS — G4733 Obstructive sleep apnea (adult) (pediatric): Secondary | ICD-10-CM | POA: Diagnosis not present

## 2021-10-07 DIAGNOSIS — F32A Depression, unspecified: Secondary | ICD-10-CM | POA: Diagnosis not present

## 2021-10-07 DIAGNOSIS — K219 Gastro-esophageal reflux disease without esophagitis: Secondary | ICD-10-CM | POA: Diagnosis not present

## 2021-10-07 DIAGNOSIS — J9622 Acute and chronic respiratory failure with hypercapnia: Secondary | ICD-10-CM | POA: Diagnosis not present

## 2021-10-07 DIAGNOSIS — I480 Paroxysmal atrial fibrillation: Secondary | ICD-10-CM | POA: Diagnosis not present

## 2021-10-07 DIAGNOSIS — E1165 Type 2 diabetes mellitus with hyperglycemia: Secondary | ICD-10-CM | POA: Diagnosis not present

## 2021-10-07 DIAGNOSIS — E876 Hypokalemia: Secondary | ICD-10-CM | POA: Diagnosis not present

## 2021-10-07 DIAGNOSIS — E1140 Type 2 diabetes mellitus with diabetic neuropathy, unspecified: Secondary | ICD-10-CM | POA: Diagnosis not present

## 2021-10-07 DIAGNOSIS — R32 Unspecified urinary incontinence: Secondary | ICD-10-CM | POA: Diagnosis not present

## 2021-10-07 DIAGNOSIS — J9621 Acute and chronic respiratory failure with hypoxia: Secondary | ICD-10-CM | POA: Diagnosis not present

## 2021-10-07 DIAGNOSIS — F17210 Nicotine dependence, cigarettes, uncomplicated: Secondary | ICD-10-CM | POA: Diagnosis not present

## 2021-10-07 DIAGNOSIS — I4892 Unspecified atrial flutter: Secondary | ICD-10-CM | POA: Diagnosis not present

## 2021-10-07 DIAGNOSIS — I700 Atherosclerosis of aorta: Secondary | ICD-10-CM | POA: Diagnosis not present

## 2021-10-07 DIAGNOSIS — I214 Non-ST elevation (NSTEMI) myocardial infarction: Secondary | ICD-10-CM | POA: Diagnosis not present

## 2021-10-07 DIAGNOSIS — I2510 Atherosclerotic heart disease of native coronary artery without angina pectoris: Secondary | ICD-10-CM | POA: Diagnosis not present

## 2021-10-07 DIAGNOSIS — I110 Hypertensive heart disease with heart failure: Secondary | ICD-10-CM | POA: Diagnosis not present

## 2021-10-07 DIAGNOSIS — E440 Moderate protein-calorie malnutrition: Secondary | ICD-10-CM | POA: Diagnosis not present

## 2021-10-07 DIAGNOSIS — F419 Anxiety disorder, unspecified: Secondary | ICD-10-CM | POA: Diagnosis not present

## 2021-10-07 DIAGNOSIS — I890 Lymphedema, not elsewhere classified: Secondary | ICD-10-CM | POA: Diagnosis not present

## 2021-10-07 DIAGNOSIS — R911 Solitary pulmonary nodule: Secondary | ICD-10-CM | POA: Diagnosis not present

## 2021-10-07 DIAGNOSIS — E785 Hyperlipidemia, unspecified: Secondary | ICD-10-CM | POA: Diagnosis not present

## 2021-10-07 DIAGNOSIS — I5033 Acute on chronic diastolic (congestive) heart failure: Secondary | ICD-10-CM | POA: Diagnosis not present

## 2021-10-07 DIAGNOSIS — J439 Emphysema, unspecified: Secondary | ICD-10-CM | POA: Diagnosis not present

## 2021-10-08 DIAGNOSIS — G4733 Obstructive sleep apnea (adult) (pediatric): Secondary | ICD-10-CM | POA: Diagnosis not present

## 2021-10-08 DIAGNOSIS — J9621 Acute and chronic respiratory failure with hypoxia: Secondary | ICD-10-CM | POA: Diagnosis not present

## 2021-10-08 DIAGNOSIS — I4892 Unspecified atrial flutter: Secondary | ICD-10-CM | POA: Diagnosis not present

## 2021-10-08 DIAGNOSIS — I890 Lymphedema, not elsewhere classified: Secondary | ICD-10-CM | POA: Diagnosis not present

## 2021-10-08 DIAGNOSIS — I5033 Acute on chronic diastolic (congestive) heart failure: Secondary | ICD-10-CM | POA: Diagnosis not present

## 2021-10-08 DIAGNOSIS — E440 Moderate protein-calorie malnutrition: Secondary | ICD-10-CM | POA: Diagnosis not present

## 2021-10-08 DIAGNOSIS — J9622 Acute and chronic respiratory failure with hypercapnia: Secondary | ICD-10-CM | POA: Diagnosis not present

## 2021-10-08 DIAGNOSIS — F17210 Nicotine dependence, cigarettes, uncomplicated: Secondary | ICD-10-CM | POA: Diagnosis not present

## 2021-10-08 DIAGNOSIS — E1140 Type 2 diabetes mellitus with diabetic neuropathy, unspecified: Secondary | ICD-10-CM | POA: Diagnosis not present

## 2021-10-08 DIAGNOSIS — J439 Emphysema, unspecified: Secondary | ICD-10-CM | POA: Diagnosis not present

## 2021-10-08 DIAGNOSIS — R911 Solitary pulmonary nodule: Secondary | ICD-10-CM | POA: Diagnosis not present

## 2021-10-08 DIAGNOSIS — F32A Depression, unspecified: Secondary | ICD-10-CM | POA: Diagnosis not present

## 2021-10-08 DIAGNOSIS — I2510 Atherosclerotic heart disease of native coronary artery without angina pectoris: Secondary | ICD-10-CM | POA: Diagnosis not present

## 2021-10-08 DIAGNOSIS — E785 Hyperlipidemia, unspecified: Secondary | ICD-10-CM | POA: Diagnosis not present

## 2021-10-08 DIAGNOSIS — I214 Non-ST elevation (NSTEMI) myocardial infarction: Secondary | ICD-10-CM | POA: Diagnosis not present

## 2021-10-08 DIAGNOSIS — R32 Unspecified urinary incontinence: Secondary | ICD-10-CM | POA: Diagnosis not present

## 2021-10-08 DIAGNOSIS — K219 Gastro-esophageal reflux disease without esophagitis: Secondary | ICD-10-CM | POA: Diagnosis not present

## 2021-10-08 DIAGNOSIS — E1165 Type 2 diabetes mellitus with hyperglycemia: Secondary | ICD-10-CM | POA: Diagnosis not present

## 2021-10-08 DIAGNOSIS — E876 Hypokalemia: Secondary | ICD-10-CM | POA: Diagnosis not present

## 2021-10-08 DIAGNOSIS — I110 Hypertensive heart disease with heart failure: Secondary | ICD-10-CM | POA: Diagnosis not present

## 2021-10-08 DIAGNOSIS — F419 Anxiety disorder, unspecified: Secondary | ICD-10-CM | POA: Diagnosis not present

## 2021-10-08 DIAGNOSIS — I700 Atherosclerosis of aorta: Secondary | ICD-10-CM | POA: Diagnosis not present

## 2021-10-08 DIAGNOSIS — E669 Obesity, unspecified: Secondary | ICD-10-CM | POA: Diagnosis not present

## 2021-10-08 DIAGNOSIS — I480 Paroxysmal atrial fibrillation: Secondary | ICD-10-CM | POA: Diagnosis not present

## 2021-10-11 DIAGNOSIS — E1165 Type 2 diabetes mellitus with hyperglycemia: Secondary | ICD-10-CM | POA: Diagnosis not present

## 2021-10-11 DIAGNOSIS — J9622 Acute and chronic respiratory failure with hypercapnia: Secondary | ICD-10-CM | POA: Diagnosis not present

## 2021-10-11 DIAGNOSIS — I700 Atherosclerosis of aorta: Secondary | ICD-10-CM | POA: Diagnosis not present

## 2021-10-11 DIAGNOSIS — E785 Hyperlipidemia, unspecified: Secondary | ICD-10-CM | POA: Diagnosis not present

## 2021-10-11 DIAGNOSIS — I5033 Acute on chronic diastolic (congestive) heart failure: Secondary | ICD-10-CM | POA: Diagnosis not present

## 2021-10-11 DIAGNOSIS — I214 Non-ST elevation (NSTEMI) myocardial infarction: Secondary | ICD-10-CM | POA: Diagnosis not present

## 2021-10-11 DIAGNOSIS — F32A Depression, unspecified: Secondary | ICD-10-CM | POA: Diagnosis not present

## 2021-10-11 DIAGNOSIS — I890 Lymphedema, not elsewhere classified: Secondary | ICD-10-CM | POA: Diagnosis not present

## 2021-10-11 DIAGNOSIS — F17210 Nicotine dependence, cigarettes, uncomplicated: Secondary | ICD-10-CM | POA: Diagnosis not present

## 2021-10-11 DIAGNOSIS — I480 Paroxysmal atrial fibrillation: Secondary | ICD-10-CM | POA: Diagnosis not present

## 2021-10-11 DIAGNOSIS — J439 Emphysema, unspecified: Secondary | ICD-10-CM | POA: Diagnosis not present

## 2021-10-11 DIAGNOSIS — E1140 Type 2 diabetes mellitus with diabetic neuropathy, unspecified: Secondary | ICD-10-CM | POA: Diagnosis not present

## 2021-10-11 DIAGNOSIS — K219 Gastro-esophageal reflux disease without esophagitis: Secondary | ICD-10-CM | POA: Diagnosis not present

## 2021-10-11 DIAGNOSIS — I2510 Atherosclerotic heart disease of native coronary artery without angina pectoris: Secondary | ICD-10-CM | POA: Diagnosis not present

## 2021-10-11 DIAGNOSIS — R911 Solitary pulmonary nodule: Secondary | ICD-10-CM | POA: Diagnosis not present

## 2021-10-11 DIAGNOSIS — F419 Anxiety disorder, unspecified: Secondary | ICD-10-CM | POA: Diagnosis not present

## 2021-10-11 DIAGNOSIS — I4892 Unspecified atrial flutter: Secondary | ICD-10-CM | POA: Diagnosis not present

## 2021-10-11 DIAGNOSIS — E669 Obesity, unspecified: Secondary | ICD-10-CM | POA: Diagnosis not present

## 2021-10-11 DIAGNOSIS — E876 Hypokalemia: Secondary | ICD-10-CM | POA: Diagnosis not present

## 2021-10-11 DIAGNOSIS — G4733 Obstructive sleep apnea (adult) (pediatric): Secondary | ICD-10-CM | POA: Diagnosis not present

## 2021-10-11 DIAGNOSIS — I110 Hypertensive heart disease with heart failure: Secondary | ICD-10-CM | POA: Diagnosis not present

## 2021-10-11 DIAGNOSIS — R32 Unspecified urinary incontinence: Secondary | ICD-10-CM | POA: Diagnosis not present

## 2021-10-11 DIAGNOSIS — E440 Moderate protein-calorie malnutrition: Secondary | ICD-10-CM | POA: Diagnosis not present

## 2021-10-11 DIAGNOSIS — J9621 Acute and chronic respiratory failure with hypoxia: Secondary | ICD-10-CM | POA: Diagnosis not present

## 2021-10-17 DIAGNOSIS — F32A Depression, unspecified: Secondary | ICD-10-CM | POA: Diagnosis not present

## 2021-10-17 DIAGNOSIS — J439 Emphysema, unspecified: Secondary | ICD-10-CM | POA: Diagnosis not present

## 2021-10-17 DIAGNOSIS — G4733 Obstructive sleep apnea (adult) (pediatric): Secondary | ICD-10-CM | POA: Diagnosis not present

## 2021-10-17 DIAGNOSIS — K219 Gastro-esophageal reflux disease without esophagitis: Secondary | ICD-10-CM | POA: Diagnosis not present

## 2021-10-17 DIAGNOSIS — E440 Moderate protein-calorie malnutrition: Secondary | ICD-10-CM | POA: Diagnosis not present

## 2021-10-17 DIAGNOSIS — E876 Hypokalemia: Secondary | ICD-10-CM | POA: Diagnosis not present

## 2021-10-17 DIAGNOSIS — I890 Lymphedema, not elsewhere classified: Secondary | ICD-10-CM | POA: Diagnosis not present

## 2021-10-17 DIAGNOSIS — E669 Obesity, unspecified: Secondary | ICD-10-CM | POA: Diagnosis not present

## 2021-10-17 DIAGNOSIS — F17210 Nicotine dependence, cigarettes, uncomplicated: Secondary | ICD-10-CM | POA: Diagnosis not present

## 2021-10-17 DIAGNOSIS — E1140 Type 2 diabetes mellitus with diabetic neuropathy, unspecified: Secondary | ICD-10-CM | POA: Diagnosis not present

## 2021-10-17 DIAGNOSIS — I110 Hypertensive heart disease with heart failure: Secondary | ICD-10-CM | POA: Diagnosis not present

## 2021-10-17 DIAGNOSIS — I2510 Atherosclerotic heart disease of native coronary artery without angina pectoris: Secondary | ICD-10-CM | POA: Diagnosis not present

## 2021-10-17 DIAGNOSIS — E785 Hyperlipidemia, unspecified: Secondary | ICD-10-CM | POA: Diagnosis not present

## 2021-10-17 DIAGNOSIS — I480 Paroxysmal atrial fibrillation: Secondary | ICD-10-CM | POA: Diagnosis not present

## 2021-10-17 DIAGNOSIS — R911 Solitary pulmonary nodule: Secondary | ICD-10-CM | POA: Diagnosis not present

## 2021-10-17 DIAGNOSIS — R32 Unspecified urinary incontinence: Secondary | ICD-10-CM | POA: Diagnosis not present

## 2021-10-17 DIAGNOSIS — F419 Anxiety disorder, unspecified: Secondary | ICD-10-CM | POA: Diagnosis not present

## 2021-10-17 DIAGNOSIS — I214 Non-ST elevation (NSTEMI) myocardial infarction: Secondary | ICD-10-CM | POA: Diagnosis not present

## 2021-10-17 DIAGNOSIS — I700 Atherosclerosis of aorta: Secondary | ICD-10-CM | POA: Diagnosis not present

## 2021-10-17 DIAGNOSIS — J9622 Acute and chronic respiratory failure with hypercapnia: Secondary | ICD-10-CM | POA: Diagnosis not present

## 2021-10-17 DIAGNOSIS — J9621 Acute and chronic respiratory failure with hypoxia: Secondary | ICD-10-CM | POA: Diagnosis not present

## 2021-10-17 DIAGNOSIS — E1165 Type 2 diabetes mellitus with hyperglycemia: Secondary | ICD-10-CM | POA: Diagnosis not present

## 2021-10-17 DIAGNOSIS — I5033 Acute on chronic diastolic (congestive) heart failure: Secondary | ICD-10-CM | POA: Diagnosis not present

## 2021-10-17 DIAGNOSIS — I4892 Unspecified atrial flutter: Secondary | ICD-10-CM | POA: Diagnosis not present

## 2021-10-17 NOTE — Chronic Care Management (AMB) (Signed)
?  Chronic Care Management ?Note ? ?10/17/2021 ?Name: [name] MRN: [id number] DOB: 1945/09/19 ? ?Ronald [name] is a 76 y.o. year old male who is a primary care patient of [name], FNP and is actively engaged with the care management team. I reached out to [name] by phone today to assist with scheduling a follow up visit with the RN Case Manager ? ?Follow up plan: ?Unsuccessful telephone outreach attempt made. A HIPAA compliant phone message was left for the patient providing contact information and requesting a return call.  ?The care management team will reach out to the patient again over the next 7 days.  ?If patient returns call to provider office, please advise to call [place] at [phone] ? ?Noreene Larsson, RMA ?Care Guide, Embedded Care Coordination ?  Care Management  ?[place], [place] [address] ?Direct Dial: [phone] ?Museum/gallery conservator.[email or web address] ?Website: [email or web address]  ? ?

## 2021-10-21 ENCOUNTER — Encounter: Payer: Self-pay | Admitting: Physician Assistant
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
## 2021-10-22 ENCOUNTER — Ambulatory Visit: Payer: Medicare HMO | Admitting: Physician Assistant

## 2021-10-22 DIAGNOSIS — I2510 Atherosclerotic heart disease of native coronary artery without angina pectoris: Secondary | ICD-10-CM

## 2021-10-22 DIAGNOSIS — I4892 Unspecified atrial flutter: Secondary | ICD-10-CM | POA: Diagnosis not present

## 2021-10-22 DIAGNOSIS — F17210 Nicotine dependence, cigarettes, uncomplicated: Secondary | ICD-10-CM | POA: Diagnosis not present

## 2021-10-22 DIAGNOSIS — E1140 Type 2 diabetes mellitus with diabetic neuropathy, unspecified: Secondary | ICD-10-CM | POA: Diagnosis not present

## 2021-10-22 DIAGNOSIS — K219 Gastro-esophageal reflux disease without esophagitis: Secondary | ICD-10-CM | POA: Diagnosis not present

## 2021-10-22 DIAGNOSIS — R32 Unspecified urinary incontinence: Secondary | ICD-10-CM | POA: Diagnosis not present

## 2021-10-22 DIAGNOSIS — I480 Paroxysmal atrial fibrillation: Secondary | ICD-10-CM

## 2021-10-22 DIAGNOSIS — J9621 Acute and chronic respiratory failure with hypoxia: Secondary | ICD-10-CM | POA: Diagnosis not present

## 2021-10-22 DIAGNOSIS — F32A Depression, unspecified: Secondary | ICD-10-CM | POA: Diagnosis not present

## 2021-10-22 DIAGNOSIS — J9622 Acute and chronic respiratory failure with hypercapnia: Secondary | ICD-10-CM | POA: Diagnosis not present

## 2021-10-22 DIAGNOSIS — E440 Moderate protein-calorie malnutrition: Secondary | ICD-10-CM | POA: Diagnosis not present

## 2021-10-22 DIAGNOSIS — J439 Emphysema, unspecified: Secondary | ICD-10-CM | POA: Diagnosis not present

## 2021-10-22 DIAGNOSIS — I890 Lymphedema, not elsewhere classified: Secondary | ICD-10-CM | POA: Diagnosis not present

## 2021-10-22 DIAGNOSIS — E669 Obesity, unspecified: Secondary | ICD-10-CM | POA: Diagnosis not present

## 2021-10-22 DIAGNOSIS — F419 Anxiety disorder, unspecified: Secondary | ICD-10-CM | POA: Diagnosis not present

## 2021-10-22 DIAGNOSIS — I214 Non-ST elevation (NSTEMI) myocardial infarction: Secondary | ICD-10-CM | POA: Diagnosis not present

## 2021-10-22 DIAGNOSIS — I10 Essential (primary) hypertension: Secondary | ICD-10-CM

## 2021-10-22 DIAGNOSIS — E876 Hypokalemia: Secondary | ICD-10-CM | POA: Diagnosis not present

## 2021-10-22 DIAGNOSIS — I5033 Acute on chronic diastolic (congestive) heart failure: Secondary | ICD-10-CM | POA: Diagnosis not present

## 2021-10-22 DIAGNOSIS — R911 Solitary pulmonary nodule: Secondary | ICD-10-CM | POA: Diagnosis not present

## 2021-10-22 DIAGNOSIS — E1165 Type 2 diabetes mellitus with hyperglycemia: Secondary | ICD-10-CM | POA: Diagnosis not present

## 2021-10-22 DIAGNOSIS — I110 Hypertensive heart disease with heart failure: Secondary | ICD-10-CM | POA: Diagnosis not present

## 2021-10-22 DIAGNOSIS — E785 Hyperlipidemia, unspecified: Secondary | ICD-10-CM | POA: Diagnosis not present

## 2021-10-22 DIAGNOSIS — G4733 Obstructive sleep apnea (adult) (pediatric): Secondary | ICD-10-CM | POA: Diagnosis not present

## 2021-10-22 DIAGNOSIS — I5032 Chronic diastolic (congestive) heart failure: Secondary | ICD-10-CM

## 2021-10-22 DIAGNOSIS — I700 Atherosclerosis of aorta: Secondary | ICD-10-CM | POA: Diagnosis not present

## 2021-10-22 NOTE — Chronic Care Management (AMB) (Signed)
?  Chronic Care Management ?Note ? ?10/22/2021 ?Name: Ronald [name] MRN: [id number] DOB: 10-13-1945 ? ?Ronald [name] is a 76 y.o. year old male who is a primary care patient of [name], FNP and is actively engaged with the care management team. I reached out to [name] by phone today to assist with re-scheduling a follow up visit with the RN Case Manager ? ?Follow up plan: ?Unable to make contact on outreach attempts x 3. PCP [name], FNP notified via routed documentation in medical record.  ? ?Noreene Larsson, RMA ?Care Guide, Embedded Care Coordination ?[facility]  Care Management  ?[place], [place] [address] ?Direct Dial: [phone] ?Museum/gallery conservator.[email or web address] ?Website: [email or web address]  ? ?

## 2021-10-23 ENCOUNTER — Encounter: Payer: Self-pay | Admitting: Physician Assistant

## 2021-10-23 DIAGNOSIS — F32A Depression, unspecified: Secondary | ICD-10-CM | POA: Diagnosis not present

## 2021-10-23 DIAGNOSIS — I480 Paroxysmal atrial fibrillation: Secondary | ICD-10-CM | POA: Diagnosis not present

## 2021-10-23 DIAGNOSIS — I110 Hypertensive heart disease with heart failure: Secondary | ICD-10-CM | POA: Diagnosis not present

## 2021-10-23 DIAGNOSIS — R32 Unspecified urinary incontinence: Secondary | ICD-10-CM | POA: Diagnosis not present

## 2021-10-23 DIAGNOSIS — I2510 Atherosclerotic heart disease of native coronary artery without angina pectoris: Secondary | ICD-10-CM | POA: Diagnosis not present

## 2021-10-23 DIAGNOSIS — I700 Atherosclerosis of aorta: Secondary | ICD-10-CM | POA: Diagnosis not present

## 2021-10-23 DIAGNOSIS — J9622 Acute and chronic respiratory failure with hypercapnia: Secondary | ICD-10-CM | POA: Diagnosis not present

## 2021-10-23 DIAGNOSIS — J439 Emphysema, unspecified: Secondary | ICD-10-CM | POA: Diagnosis not present

## 2021-10-23 DIAGNOSIS — E1165 Type 2 diabetes mellitus with hyperglycemia: Secondary | ICD-10-CM | POA: Diagnosis not present

## 2021-10-23 DIAGNOSIS — I5033 Acute on chronic diastolic (congestive) heart failure: Secondary | ICD-10-CM | POA: Diagnosis not present

## 2021-10-23 DIAGNOSIS — R911 Solitary pulmonary nodule: Secondary | ICD-10-CM | POA: Diagnosis not present

## 2021-10-23 DIAGNOSIS — G4733 Obstructive sleep apnea (adult) (pediatric): Secondary | ICD-10-CM | POA: Diagnosis not present

## 2021-10-23 DIAGNOSIS — I214 Non-ST elevation (NSTEMI) myocardial infarction: Secondary | ICD-10-CM | POA: Diagnosis not present

## 2021-10-23 DIAGNOSIS — I890 Lymphedema, not elsewhere classified: Secondary | ICD-10-CM | POA: Diagnosis not present

## 2021-10-23 DIAGNOSIS — F17210 Nicotine dependence, cigarettes, uncomplicated: Secondary | ICD-10-CM | POA: Diagnosis not present

## 2021-10-23 DIAGNOSIS — K219 Gastro-esophageal reflux disease without esophagitis: Secondary | ICD-10-CM | POA: Diagnosis not present

## 2021-10-23 DIAGNOSIS — E669 Obesity, unspecified: Secondary | ICD-10-CM | POA: Diagnosis not present

## 2021-10-23 DIAGNOSIS — F419 Anxiety disorder, unspecified: Secondary | ICD-10-CM | POA: Diagnosis not present

## 2021-10-23 DIAGNOSIS — E785 Hyperlipidemia, unspecified: Secondary | ICD-10-CM | POA: Diagnosis not present

## 2021-10-23 DIAGNOSIS — J9621 Acute and chronic respiratory failure with hypoxia: Secondary | ICD-10-CM | POA: Diagnosis not present

## 2021-10-23 DIAGNOSIS — E1140 Type 2 diabetes mellitus with diabetic neuropathy, unspecified: Secondary | ICD-10-CM | POA: Diagnosis not present

## 2021-10-23 DIAGNOSIS — E440 Moderate protein-calorie malnutrition: Secondary | ICD-10-CM | POA: Diagnosis not present

## 2021-10-23 DIAGNOSIS — I4892 Unspecified atrial flutter: Secondary | ICD-10-CM | POA: Diagnosis not present

## 2021-10-23 DIAGNOSIS — E876 Hypokalemia: Secondary | ICD-10-CM | POA: Diagnosis not present

## 2021-10-28 ENCOUNTER — Telehealth: Payer: Self-pay | Admitting: Family

## 2021-10-28 NOTE — Telephone Encounter (Signed)
Ok to give samples.   [name], FNP

## 2021-10-29 NOTE — Telephone Encounter (Signed)
Trelegy samples left up front and pt is aware.

## 2021-10-30 DIAGNOSIS — I700 Atherosclerosis of aorta: Secondary | ICD-10-CM | POA: Diagnosis not present

## 2021-10-30 DIAGNOSIS — E440 Moderate protein-calorie malnutrition: Secondary | ICD-10-CM | POA: Diagnosis not present

## 2021-10-30 DIAGNOSIS — I4892 Unspecified atrial flutter: Secondary | ICD-10-CM | POA: Diagnosis not present

## 2021-10-30 DIAGNOSIS — J9622 Acute and chronic respiratory failure with hypercapnia: Secondary | ICD-10-CM | POA: Diagnosis not present

## 2021-10-30 DIAGNOSIS — I214 Non-ST elevation (NSTEMI) myocardial infarction: Secondary | ICD-10-CM | POA: Diagnosis not present

## 2021-10-30 DIAGNOSIS — I480 Paroxysmal atrial fibrillation: Secondary | ICD-10-CM | POA: Diagnosis not present

## 2021-10-30 DIAGNOSIS — R911 Solitary pulmonary nodule: Secondary | ICD-10-CM | POA: Diagnosis not present

## 2021-10-30 DIAGNOSIS — I890 Lymphedema, not elsewhere classified: Secondary | ICD-10-CM | POA: Diagnosis not present

## 2021-10-30 DIAGNOSIS — I110 Hypertensive heart disease with heart failure: Secondary | ICD-10-CM | POA: Diagnosis not present

## 2021-10-30 DIAGNOSIS — E1140 Type 2 diabetes mellitus with diabetic neuropathy, unspecified: Secondary | ICD-10-CM | POA: Diagnosis not present

## 2021-10-30 DIAGNOSIS — E785 Hyperlipidemia, unspecified: Secondary | ICD-10-CM | POA: Diagnosis not present

## 2021-10-30 DIAGNOSIS — F17210 Nicotine dependence, cigarettes, uncomplicated: Secondary | ICD-10-CM | POA: Diagnosis not present

## 2021-10-30 DIAGNOSIS — J439 Emphysema, unspecified: Secondary | ICD-10-CM | POA: Diagnosis not present

## 2021-10-30 DIAGNOSIS — E1165 Type 2 diabetes mellitus with hyperglycemia: Secondary | ICD-10-CM | POA: Diagnosis not present

## 2021-10-30 DIAGNOSIS — F419 Anxiety disorder, unspecified: Secondary | ICD-10-CM | POA: Diagnosis not present

## 2021-10-30 DIAGNOSIS — K219 Gastro-esophageal reflux disease without esophagitis: Secondary | ICD-10-CM | POA: Diagnosis not present

## 2021-10-30 DIAGNOSIS — J9621 Acute and chronic respiratory failure with hypoxia: Secondary | ICD-10-CM | POA: Diagnosis not present

## 2021-10-30 DIAGNOSIS — G4733 Obstructive sleep apnea (adult) (pediatric): Secondary | ICD-10-CM | POA: Diagnosis not present

## 2021-10-30 DIAGNOSIS — E876 Hypokalemia: Secondary | ICD-10-CM | POA: Diagnosis not present

## 2021-10-30 DIAGNOSIS — R32 Unspecified urinary incontinence: Secondary | ICD-10-CM | POA: Diagnosis not present

## 2021-10-30 DIAGNOSIS — E669 Obesity, unspecified: Secondary | ICD-10-CM | POA: Diagnosis not present

## 2021-10-30 DIAGNOSIS — I2510 Atherosclerotic heart disease of native coronary artery without angina pectoris: Secondary | ICD-10-CM | POA: Diagnosis not present

## 2021-10-30 DIAGNOSIS — F32A Depression, unspecified: Secondary | ICD-10-CM | POA: Diagnosis not present

## 2021-10-30 DIAGNOSIS — I5033 Acute on chronic diastolic (congestive) heart failure: Secondary | ICD-10-CM | POA: Diagnosis not present

## 2021-11-01 DIAGNOSIS — I5030 Unspecified diastolic (congestive) heart failure: Secondary | ICD-10-CM | POA: Diagnosis not present

## 2021-11-03 DIAGNOSIS — J439 Emphysema, unspecified: Secondary | ICD-10-CM | POA: Diagnosis not present

## 2021-11-05 DIAGNOSIS — I110 Hypertensive heart disease with heart failure: Secondary | ICD-10-CM | POA: Diagnosis not present

## 2021-11-05 DIAGNOSIS — K219 Gastro-esophageal reflux disease without esophagitis: Secondary | ICD-10-CM | POA: Diagnosis not present

## 2021-11-05 DIAGNOSIS — R911 Solitary pulmonary nodule: Secondary | ICD-10-CM | POA: Diagnosis not present

## 2021-11-05 DIAGNOSIS — I2510 Atherosclerotic heart disease of native coronary artery without angina pectoris: Secondary | ICD-10-CM | POA: Diagnosis not present

## 2021-11-05 DIAGNOSIS — I480 Paroxysmal atrial fibrillation: Secondary | ICD-10-CM | POA: Diagnosis not present

## 2021-11-05 DIAGNOSIS — I700 Atherosclerosis of aorta: Secondary | ICD-10-CM | POA: Diagnosis not present

## 2021-11-05 DIAGNOSIS — I5033 Acute on chronic diastolic (congestive) heart failure: Secondary | ICD-10-CM | POA: Diagnosis not present

## 2021-11-05 DIAGNOSIS — J439 Emphysema, unspecified: Secondary | ICD-10-CM | POA: Diagnosis not present

## 2021-11-05 DIAGNOSIS — F32A Depression, unspecified: Secondary | ICD-10-CM | POA: Diagnosis not present

## 2021-11-05 DIAGNOSIS — I890 Lymphedema, not elsewhere classified: Secondary | ICD-10-CM | POA: Diagnosis not present

## 2021-11-05 DIAGNOSIS — F17210 Nicotine dependence, cigarettes, uncomplicated: Secondary | ICD-10-CM | POA: Diagnosis not present

## 2021-11-05 DIAGNOSIS — J9621 Acute and chronic respiratory failure with hypoxia: Secondary | ICD-10-CM | POA: Diagnosis not present

## 2021-11-05 DIAGNOSIS — E785 Hyperlipidemia, unspecified: Secondary | ICD-10-CM | POA: Diagnosis not present

## 2021-11-05 DIAGNOSIS — G4733 Obstructive sleep apnea (adult) (pediatric): Secondary | ICD-10-CM | POA: Diagnosis not present

## 2021-11-05 DIAGNOSIS — I4892 Unspecified atrial flutter: Secondary | ICD-10-CM | POA: Diagnosis not present

## 2021-11-05 DIAGNOSIS — F419 Anxiety disorder, unspecified: Secondary | ICD-10-CM | POA: Diagnosis not present

## 2021-11-05 DIAGNOSIS — E876 Hypokalemia: Secondary | ICD-10-CM | POA: Diagnosis not present

## 2021-11-05 DIAGNOSIS — J9622 Acute and chronic respiratory failure with hypercapnia: Secondary | ICD-10-CM | POA: Diagnosis not present

## 2021-11-05 DIAGNOSIS — E1165 Type 2 diabetes mellitus with hyperglycemia: Secondary | ICD-10-CM | POA: Diagnosis not present

## 2021-11-05 DIAGNOSIS — E1140 Type 2 diabetes mellitus with diabetic neuropathy, unspecified: Secondary | ICD-10-CM | POA: Diagnosis not present

## 2021-11-05 DIAGNOSIS — R32 Unspecified urinary incontinence: Secondary | ICD-10-CM | POA: Diagnosis not present

## 2021-11-05 DIAGNOSIS — E669 Obesity, unspecified: Secondary | ICD-10-CM | POA: Diagnosis not present

## 2021-11-05 DIAGNOSIS — I214 Non-ST elevation (NSTEMI) myocardial infarction: Secondary | ICD-10-CM | POA: Diagnosis not present

## 2021-11-05 DIAGNOSIS — E440 Moderate protein-calorie malnutrition: Secondary | ICD-10-CM | POA: Diagnosis not present

## 2021-11-10 ENCOUNTER — Other Ambulatory Visit: Payer: Self-pay | Admitting: Family

## 2021-11-17 ENCOUNTER — Other Ambulatory Visit: Payer: Self-pay | Admitting: Family

## 2021-11-21 ENCOUNTER — Telehealth: Payer: Self-pay | Admitting: Family

## 2021-11-21 NOTE — Telephone Encounter (Signed)
Left message to return call if needed.  We do not have Trellegy on pts med list and Nitro was sent in April.

## 2021-11-22 ENCOUNTER — Emergency Department (HOSPITAL_COMMUNITY): Payer: Medicare HMO

## 2021-11-22 ENCOUNTER — Encounter (HOSPITAL_COMMUNITY): Payer: Self-pay

## 2021-11-22 ENCOUNTER — Other Ambulatory Visit: Payer: Self-pay

## 2021-11-22 ENCOUNTER — Inpatient Hospital Stay (HOSPITAL_COMMUNITY)
Admission: EM | Admit: 2021-11-22 | Discharge: 2021-11-26 | DRG: 291 | Disposition: A | Discharge status: Home Health | Payer: Medicare HMO | Attending: Family Medicine | Admitting: Family Medicine

## 2021-11-22 DIAGNOSIS — R7989 Other specified abnormal findings of blood chemistry: Secondary | ICD-10-CM | POA: Diagnosis not present

## 2021-11-22 DIAGNOSIS — R079 Chest pain, unspecified: Secondary | ICD-10-CM | POA: Diagnosis not present

## 2021-11-22 DIAGNOSIS — Z801 Family history of malignant neoplasm of trachea, bronchus and lung: Secondary | ICD-10-CM

## 2021-11-22 DIAGNOSIS — Z7901 Long term (current) use of anticoagulants: Secondary | ICD-10-CM | POA: Diagnosis not present

## 2021-11-22 DIAGNOSIS — I252 Old myocardial infarction: Secondary | ICD-10-CM

## 2021-11-22 DIAGNOSIS — E876 Hypokalemia: Secondary | ICD-10-CM | POA: Diagnosis not present

## 2021-11-22 DIAGNOSIS — Z9049 Acquired absence of other specified parts of digestive tract: Secondary | ICD-10-CM | POA: Diagnosis not present

## 2021-11-22 DIAGNOSIS — Z806 Family history of leukemia: Secondary | ICD-10-CM | POA: Diagnosis not present

## 2021-11-22 DIAGNOSIS — Z7951 Long term (current) use of inhaled steroids: Secondary | ICD-10-CM

## 2021-11-22 DIAGNOSIS — R231 Pallor: Secondary | ICD-10-CM | POA: Diagnosis not present

## 2021-11-22 DIAGNOSIS — I509 Heart failure, unspecified: Secondary | ICD-10-CM | POA: Diagnosis not present

## 2021-11-22 DIAGNOSIS — J441 Chronic obstructive pulmonary disease with (acute) exacerbation: Secondary | ICD-10-CM | POA: Diagnosis not present

## 2021-11-22 DIAGNOSIS — Z8673 Personal history of transient ischemic attack (TIA), and cerebral infarction without residual deficits: Secondary | ICD-10-CM | POA: Diagnosis not present

## 2021-11-22 DIAGNOSIS — Z79899 Other long term (current) drug therapy: Secondary | ICD-10-CM

## 2021-11-22 DIAGNOSIS — Z7982 Long term (current) use of aspirin: Secondary | ICD-10-CM | POA: Diagnosis not present

## 2021-11-22 DIAGNOSIS — I5033 Acute on chronic diastolic (congestive) heart failure: Secondary | ICD-10-CM | POA: Diagnosis present

## 2021-11-22 DIAGNOSIS — I10 Essential (primary) hypertension: Secondary | ICD-10-CM | POA: Diagnosis present

## 2021-11-22 DIAGNOSIS — Z8249 Family history of ischemic heart disease and other diseases of the circulatory system: Secondary | ICD-10-CM

## 2021-11-22 DIAGNOSIS — R778 Other specified abnormalities of plasma proteins: Secondary | ICD-10-CM

## 2021-11-22 DIAGNOSIS — Z955 Presence of coronary angioplasty implant and graft: Secondary | ICD-10-CM

## 2021-11-22 DIAGNOSIS — J9621 Acute and chronic respiratory failure with hypoxia: Secondary | ICD-10-CM | POA: Diagnosis not present

## 2021-11-22 DIAGNOSIS — Z888 Allergy status to other drugs, medicaments and biological substances status: Secondary | ICD-10-CM

## 2021-11-22 DIAGNOSIS — I110 Hypertensive heart disease with heart failure: Secondary | ICD-10-CM | POA: Diagnosis not present

## 2021-11-22 DIAGNOSIS — N179 Acute kidney failure, unspecified: Secondary | ICD-10-CM | POA: Diagnosis not present

## 2021-11-22 DIAGNOSIS — E785 Hyperlipidemia, unspecified: Secondary | ICD-10-CM

## 2021-11-22 DIAGNOSIS — I4892 Unspecified atrial flutter: Secondary | ICD-10-CM | POA: Diagnosis present

## 2021-11-22 DIAGNOSIS — I482 Chronic atrial fibrillation, unspecified: Secondary | ICD-10-CM | POA: Diagnosis present

## 2021-11-22 DIAGNOSIS — E782 Mixed hyperlipidemia: Secondary | ICD-10-CM | POA: Diagnosis present

## 2021-11-22 DIAGNOSIS — R9431 Abnormal electrocardiogram [ECG] [EKG]: Secondary | ICD-10-CM

## 2021-11-22 DIAGNOSIS — K219 Gastro-esophageal reflux disease without esophagitis: Secondary | ICD-10-CM | POA: Diagnosis present

## 2021-11-22 DIAGNOSIS — Z823 Family history of stroke: Secondary | ICD-10-CM

## 2021-11-22 DIAGNOSIS — R0689 Other abnormalities of breathing: Secondary | ICD-10-CM | POA: Diagnosis not present

## 2021-11-22 DIAGNOSIS — Z825 Family history of asthma and other chronic lower respiratory diseases: Secondary | ICD-10-CM

## 2021-11-22 DIAGNOSIS — I248 Other forms of acute ischemic heart disease: Secondary | ICD-10-CM | POA: Diagnosis present

## 2021-11-22 DIAGNOSIS — I2510 Atherosclerotic heart disease of native coronary artery without angina pectoris: Secondary | ICD-10-CM | POA: Diagnosis not present

## 2021-11-22 DIAGNOSIS — Z87891 Personal history of nicotine dependence: Secondary | ICD-10-CM

## 2021-11-22 DIAGNOSIS — Z9981 Dependence on supplemental oxygen: Secondary | ICD-10-CM

## 2021-11-22 DIAGNOSIS — R0902 Hypoxemia: Secondary | ICD-10-CM | POA: Diagnosis not present

## 2021-11-22 DIAGNOSIS — J811 Chronic pulmonary edema: Secondary | ICD-10-CM | POA: Diagnosis not present

## 2021-11-22 DIAGNOSIS — R0602 Shortness of breath: Secondary | ICD-10-CM | POA: Diagnosis not present

## 2021-11-22 DIAGNOSIS — I5020 Unspecified systolic (congestive) heart failure: Secondary | ICD-10-CM

## 2021-11-22 DIAGNOSIS — J90 Pleural effusion, not elsewhere classified: Secondary | ICD-10-CM | POA: Diagnosis not present

## 2021-11-22 DIAGNOSIS — Z743 Need for continuous supervision: Secondary | ICD-10-CM | POA: Diagnosis not present

## 2021-11-22 LAB — CBC WITH DIFFERENTIAL/PLATELET
Abs Immature Granulocytes: 0.02 10*3/uL (ref 0.00–0.07)
Basophils Absolute: 0.1 10*3/uL (ref 0.0–0.1)
Basophils Relative: 1 %
Eosinophils Absolute: 0.1 10*3/uL (ref 0.0–0.5)
Eosinophils Relative: 2 %
HCT: 37.8 % — ABNORMAL LOW (ref 39.0–52.0)
Hemoglobin: 12.3 g/dL — ABNORMAL LOW (ref 13.0–17.0)
Immature Granulocytes: 0 %
Lymphocytes Relative: 16 %
Lymphs Abs: 0.8 10*3/uL (ref 0.7–4.0)
MCH: 26.2 pg (ref 26.0–34.0)
MCHC: 32.5 g/dL (ref 30.0–36.0)
MCV: 80.4 fL (ref 80.0–100.0)
Monocytes Absolute: 0.8 10*3/uL (ref 0.1–1.0)
Monocytes Relative: 16 %
Neutro Abs: 3.3 10*3/uL (ref 1.7–7.7)
Neutrophils Relative %: 65 %
Platelets: 320 10*3/uL (ref 150–400)
RBC: 4.7 MIL/uL (ref 4.22–5.81)
RDW: 14 % (ref 11.5–15.5)
WBC: 5.1 10*3/uL (ref 4.0–10.5)
nRBC: 0 % (ref 0.0–0.2)

## 2021-11-22 LAB — COMPREHENSIVE METABOLIC PANEL
ALT: 11 U/L (ref 0–44)
AST: 14 U/L — ABNORMAL LOW (ref 15–41)
Albumin: 3.8 g/dL (ref 3.5–5.0)
Alkaline Phosphatase: 100 U/L (ref 38–126)
Anion gap: 10 (ref 5–15)
BUN: 25 mg/dL — ABNORMAL HIGH (ref 8–23)
CO2: 30 mmol/L (ref 22–32)
Calcium: 8.6 mg/dL — ABNORMAL LOW (ref 8.9–10.3)
Chloride: 99 mmol/L (ref 98–111)
Creatinine, Ser: 1.79 mg/dL — ABNORMAL HIGH (ref 0.61–1.24)
GFR, Estimated: 40 mL/min — ABNORMAL LOW (ref >60)
Glucose, Bld: 138 mg/dL — ABNORMAL HIGH (ref 70–99)
Potassium: 3.1 mmol/L — ABNORMAL LOW (ref 3.5–5.1)
Sodium: 139 mmol/L (ref 135–145)
Total Bilirubin: 0.7 mg/dL (ref 0.3–1.2)
Total Protein: 7.4 g/dL (ref 6.5–8.1)

## 2021-11-22 LAB — BRAIN NATRIURETIC PEPTIDE: B Natriuretic Peptide: 130 pg/mL — ABNORMAL HIGH (ref 0.0–100.0)

## 2021-11-22 LAB — TROPONIN I (HIGH SENSITIVITY): Troponin I (High Sensitivity): 108 ng/L (ref <18)

## 2021-11-22 MED ORDER — IPRATROPIUM-ALBUTEROL 0.5-2.5 (3) MG/3ML IN SOLN
3.0000 mL | RESPIRATORY_TRACT | Status: DC | PRN
  Administered 2021-11-25: 3 mL via RESPIRATORY_TRACT
  Filled 2021-11-22: qty 3

## 2021-11-22 MED ORDER — DM-GUAIFENESIN ER 30-600 MG PO TB12
1.0000 | ORAL_TABLET | Freq: Two times a day (BID) | ORAL | Status: DC
  Administered 2021-11-23 – 2021-11-26 (×8): 1 via ORAL
  Filled 2021-11-22 (×8): qty 1

## 2021-11-22 MED ORDER — MAGNESIUM SULFATE 2 GM/50ML IV SOLN
2.0000 g | Freq: Once | INTRAVENOUS | Status: AC
  Administered 2021-11-22: 2 g via INTRAVENOUS
  Filled 2021-11-22: qty 50

## 2021-11-22 MED ORDER — METHYLPREDNISOLONE SODIUM SUCC 40 MG IJ SOLR
40.0000 mg | Freq: Two times a day (BID) | INTRAMUSCULAR | Status: DC
  Administered 2021-11-23 – 2021-11-26 (×7): 40 mg via INTRAVENOUS
  Filled 2021-11-22 (×7): qty 1

## 2021-11-22 MED ORDER — IPRATROPIUM-ALBUTEROL 0.5-2.5 (3) MG/3ML IN SOLN
3.0000 mL | Freq: Once | RESPIRATORY_TRACT | Status: AC
  Administered 2021-11-22: 3 mL via RESPIRATORY_TRACT

## 2021-11-22 MED ORDER — PANTOPRAZOLE SODIUM 40 MG PO TBEC
40.0000 mg | DELAYED_RELEASE_TABLET | Freq: Every day | ORAL | Status: DC
  Administered 2021-11-23 – 2021-11-26 (×4): 40 mg via ORAL
  Filled 2021-11-22 (×4): qty 1

## 2021-11-22 MED ORDER — POTASSIUM CHLORIDE 10 MEQ/100ML IV SOLN
10.0000 meq | Freq: Once | INTRAVENOUS | Status: AC
  Administered 2021-11-22: 10 meq via INTRAVENOUS
  Filled 2021-11-22: qty 100

## 2021-11-22 MED ORDER — IPRATROPIUM-ALBUTEROL 0.5-2.5 (3) MG/3ML IN SOLN
RESPIRATORY_TRACT | Status: AC
  Filled 2021-11-22: qty 3

## 2021-11-22 MED ORDER — FUROSEMIDE 10 MG/ML IJ SOLN
40.0000 mg | Freq: Once | INTRAMUSCULAR | Status: AC
  Administered 2021-11-22: 40 mg via INTRAVENOUS
  Filled 2021-11-22: qty 4

## 2021-11-22 MED ORDER — IPRATROPIUM-ALBUTEROL 0.5-2.5 (3) MG/3ML IN SOLN: 3.0000 mL | Freq: Four times a day (QID) | RESPIRATORY_TRACT | Status: DC

## 2021-11-22 NOTE — ED Provider Notes (Incomplete)
[facility] EMERGENCY DEPARTMENT Provider Note   CSN: [id number] Arrival date & time: 11/22/21  2014     History {Add pertinent medical, surgical, social history, OB history to HPI:1} Chief Complaint  Patient presents with   Shortness of Breath    Ronald [name] is a 76 y.o. male.  Patient complains of shortness of breath.  Patient has a history of heart failure and COPD   Shortness of Breath      Home Medications Prior to Admission medications   Medication Sig Start Date End Date Taking? Authorizing Provider  albuterol (VENTOLIN HFA) 108 (90 Base) MCG/ACT inhaler INHALE 2 PUFFS EVERY 6 HOURS AS NEEEDED Patient taking differently: Inhale 2 puffs into the lungs every 6 (six) hours as needed for wheezing or shortness of breath. 11/02/19   [name], FNP  ALPRAZolam Duanne Moron) 0.5 MG tablet Take 1 tablet (0.5 mg total) by mouth 2 (two) times daily as needed. for anxiety 09/27/21   [name], FNP  amiodarone (PACERONE) 200 MG tablet Take 1 tablet by mouth once daily 11/11/21   [name] A, FNP  amLODipine (NORVASC) 5 MG tablet Take 1 tablet (5 mg total) by mouth daily. 10/03/21   [name] A, FNP  aspirin EC 81 MG EC tablet Take 1 tablet (81 mg total) by mouth daily. Swallow whole. 09/05/21   Phillips Grout, MD  blood glucose meter kit and supplies Dispense based on patient and insurance preference. Use up to four times daily as directed. (FOR ICD-10 E10.9, E11.9). 07/05/21   [name], FNP  Blood Glucose Monitoring Suppl (ACCU-CHEK GUIDE ME) w/Device KIT Check BS daily Dx E11.9 05/16/20   [name] A, FNP  budesonide (PULMICORT) 0.5 MG/2ML nebulizer solution Take 2 mLs (0.5 mg total) by nebulization 2 (two) times daily. 07/05/21   Hawks, Christy A, FNP  ELIQUIS 5 MG TABS tablet Take 1 tablet (5 mg total) by mouth 2 (two) times daily. 08/09/21   [name], FNP  furosemide (LASIX) 40 MG tablet Take 1.5 tablets (60 mg total) by mouth daily. 09/04/21 10/04/21   Phillips Grout, MD  glucose blood (ONE TOUCH ULTRA TEST) test strip USE TO CHECK GLUCOSE ONCE DAILY 02/11/18   Terald Sleeper, PA-C  ipratropium-albuterol (DUONEB) 0.5-2.5 (3) MG/3ML SOLN Take 3 mLs by nebulization every 6 (six) hours as needed (asthma).    [provider]  isosorbide mononitrate (ISMO) 10 MG tablet Take 1 tablet by mouth twice daily 11/11/21   [name] A, FNP  losartan (COZAAR) 100 MG tablet Take 1 tablet (100 mg total) by mouth daily. 08/14/21   [name], FNP  metoprolol tartrate (LOPRESSOR) 25 MG tablet Take 1 tablet (25 mg total) by mouth 2 (two) times daily. 08/26/21   [name], FNP  nitroGLYCERIN (NITROSTAT) 0.4 MG SL tablet Place 1 tablet (0.4 mg total) under the tongue every 5 (five) minutes as needed for chest pain. 10/04/21   [name], FNP  omeprazole (PRILOSEC) 20 MG capsule Take 20 mg by mouth daily. 07/05/21   [provider]  OXYGEN Inhale 3-4 L into the lungs daily.    [provider]  potassium chloride (KLOR-CON) 20 MEQ packet Take 20 mEq by mouth daily. 09/04/21 10/04/21  Phillips Grout, MD  rosuvastatin (CRESTOR) 20 MG tablet Take 1 tablet (20 mg total) by mouth daily. 10/03/21 04/01/22  [name], FNP      Allergies  Gabapentin and Metformin and related    Review of Systems   Review of Systems  Respiratory:  Positive for shortness of breath.     Physical Exam Updated Vital Signs BP (!) 173/73   Pulse 72   Temp 98.7 F (37.1 C) (Oral)   Resp 20   Ht 5' 9"  (1.753 m)   Wt 81.6 kg   SpO2 100%   BMI 26.58 kg/m  Physical Exam  ED Results / Procedures / Treatments   Labs (all labs ordered are listed, but only abnormal results are displayed) Labs Reviewed  CBC WITH DIFFERENTIAL/PLATELET - Abnormal; Notable for the following components:      Result Value   Hemoglobin 12.3 (*)    HCT 37.8 (*)    All other components within normal limits  COMPREHENSIVE METABOLIC PANEL - Abnormal; Notable for  the following components:   Potassium 3.1 (*)    Glucose, Bld 138 (*)    BUN 25 (*)    Creatinine, Ser 1.79 (*)    Calcium 8.6 (*)    AST 14 (*)    GFR, Estimated 40 (*)    All other components within normal limits  BRAIN NATRIURETIC PEPTIDE - Abnormal; Notable for the following components:   B Natriuretic Peptide 130.0 (*)    All other components within normal limits  TROPONIN I (HIGH SENSITIVITY) - Abnormal; Notable for the following components:   Troponin I (High Sensitivity) 108 (*)    All other components within normal limits  TROPONIN I (HIGH SENSITIVITY)    EKG EKG Interpretation  Date/Time:  Friday November 22 2021 21:53:30 EDT Ventricular Rate:  73 PR Interval:  197 QRS Duration: 117 QT Interval:  457 QTC Calculation: 504 R Axis:   44 Text Interpretation: Sinus rhythm LVH with IVCD and secondary repol abnrm Prolonged QT interval Confirmed by Milton Ferguson [id number]) on 11/22/2021 10:10:38 PM  Radiology DG Chest Port 1 View  Result Date: 11/22/2021 CLINICAL DATA:  Shortness of breath beginning 2 days ago. EXAM: PORTABLE CHEST 1 VIEW COMPARISON:  Two-view chest x-ray 09/27/2021 FINDINGS: Heart is mildly enlarged. Atherosclerotic calcifications are again seen at the aortic arch. Mild edema is present. Significant effusion. No airspace consolidation. Mild degenerative changes are again seen in the thoracic spine. IMPRESSION: Mild cardiomegaly and mild edema, compatible with congestive heart failure. Electronically Signed   By: San Morelle M.D.   On: 11/22/2021 21:03    Procedures Procedures  {Document cardiac monitor, telemetry assessment procedure when appropriate:1}  Medications Ordered in ED Medications  ipratropium-albuterol (DUONEB) 0.5-2.5 (3) MG/3ML nebulizer solution (  Canceled Entry 11/22/21 2212)  potassium chloride 10 mEq in 100 mL IVPB (10 mEq Intravenous New Bag/Given 11/22/21 2225)  magnesium sulfate IVPB 2 g 50 mL (0 g Intravenous Stopped 11/22/21 2150)   furosemide (LASIX) injection 40 mg (40 mg Intravenous Given 11/22/21 2150)  ipratropium-albuterol (DUONEB) 0.5-2.5 (3) MG/3ML nebulizer solution 3 mL (3 mLs Nebulization Given 11/22/21 2212)    ED Course/ Medical Decision Making/ A&P  CRITICAL CARE Performed by: Milton Ferguson Total critical care time: 40 minutes Critical care time was exclusive of separately billable procedures and treating other patients. Critical care was necessary to treat or prevent imminent or life-threatening deterioration. Critical care was time spent personally by me on the following activities: development of treatment plan with patient and/or surrogate as well as nursing, discussions with consultants, evaluation of patient's response to treatment, examination of patient, obtaining history from patient or surrogate, ordering and performing treatments  and interventions, ordering and review of laboratory studies, ordering and review of radiographic studies, pulse oximetry and re-evaluation of patient's condition.                          Medical Decision Making Amount and/or Complexity of Data Reviewed Labs: ordered. Radiology: ordered. ECG/medicine tests: ordered.  Risk Prescription drug management. Decision regarding hospitalization.   Patient with congestive heart failure and COPD exacerbation he will be admitted to medicine  {Document critical care time when appropriate:1} {Document review of labs and clinical decision tools ie heart score, Chads2Vasc2 etc:1}  {Document your independent review of radiology images, and any outside records:1} {Document your discussion with family members, caretakers, and with consultants:1} {Document social determinants of health affecting pt's care:1} {Document your decision making why or why not admission, treatments were needed:1} Final Clinical Impression(s) / ED Diagnoses Final diagnoses:  COPD exacerbation ([place])  Systolic congestive heart failure, unspecified HF  chronicity ([place])    Rx / DC Orders ED Discharge Orders     None

## 2021-11-22 NOTE — ED Triage Notes (Signed)
Patient brought in via [facility] for shortness of breath that started 2 days ago.  EMS states patient has decreased lung sounds bilat and 78% on room air.  Also has edema in all extremities.  Patient received 125 mg of Solu-Medrol.

## 2021-11-22 NOTE — H&P (Signed)
History and Physical    Patient: Ronald [name] KVQ:[id number] DOB: 04-May-1946 DOA: 11/22/2021 DOS: the patient was seen and examined on 11/23/2021 PCP: [name], FNP  Patient coming from: Home  Chief Complaint:  Chief Complaint  Patient presents with   Shortness of Breath   HPI: Ronald [name] is a 76 y.o. male with medical history significant of chronic respiratory failure on 3 LPM of oxygen, COPD, HFpEF, hypertension, hyperlipidemia, GERD, CAD s/p stent placement who presents to the emergency department via EMS due to 2-day onset of worsening shortness of breath.  Patient states that he was exposed to an insecticide spray at a friend's house about 2 days ago and that the symptoms started shortly after this, he was using home medications with only minimal relief, so he activated EMS, on arrival of EMS team, patient was noted with decreased lung sounds bilaterally with an O2 sat of 78% on room air. Solu-Medrol  125 mg x 1 was given en route to the hospital.  He denies chest pain, fever, chills, headache, blurry vision, nausea, vomiting.  ED Course:  In the emergency department, he was intermittently tachypneic, BP was 15767 and other signs are within normal range.  Work-up in the ED showed normocytic anemia, BMP showed hypokalemia, BUN/creatinine 25/1.79 (baseline creatinine at 0.9-1.2), eGFR 40, CBG 138, high-sensitivity troponin 108 > 120, BNP 130 Chest x-ray showed mild cardiomegaly and mild edema, compatible with congestive heart failure Patient was treated with IV Lasix 40 mg x 1, breathing treatment with DuoNeb was provided, magnesium was given.  Hospitalist was asked to admit patient for further evaluation and management.  Review of Systems: Review of systems as noted in the HPI. All other systems reviewed and are negative.   Past Medical History:  Diagnosis Date   Anxiety    Asthma    Atrial flutter ([place]) 04/2021   Chronic diastolic CHF (congestive heart failure) (HCC)     Chronic lower back pain    COPD (chronic obstructive pulmonary disease) (HCC)    Coronary artery disease    a. NSTEMI 05/2014 s/p DESx2 to LAD at [facility].   Depression    GERD (gastroesophageal reflux disease)    High cholesterol    History of tracheostomy    Hypertension    NSTEMI (non-ST elevated myocardial infarction) ([place]) 05/2014   with stent placement   PAF (paroxysmal atrial fibrillation) ([place])    Sleep apnea    Stroke ([place]) 2017   anyeusym    TIA (transient ischemic attack)    "they say I've had some mini strokes; don't know when"; denies residual on 06/22/2014)   Tobacco abuse    Type II diabetes mellitus ([place])    Ulcerative colitis ([place])    Past Surgical History:  Procedure Laterality Date   APPENDECTOMY     BIOPSY  07/20/2020   Procedure: BIOPSY;  Surgeon: Harvel Quale, MD;  Location: [facility];  Service: Gastroenterology;;   CARDIAC CATHETERIZATION  [phone]   CHOLECYSTECTOMY     COLONOSCOPY WITH PROPOFOL N/A 07/20/2020   Procedure: COLONOSCOPY WITH PROPOFOL;  Surgeon: Harvel Quale, MD;  Location: [facility];  Service: Gastroenterology;  Laterality: N/A;  1:15   CORONARY ANGIOPLASTY WITH STENT PLACEMENT  05/2014   "2"   ESOPHAGEAL DILATION N/A 07/20/2020   Procedure: ESOPHAGEAL DILATION;  Surgeon: Harvel Quale, MD;  Location: [facility];  Service: Gastroenterology;  Laterality: N/A;   ESOPHAGOGASTRODUODENOSCOPY (EGD) WITH PROPOFOL N/A 07/20/2020  Procedure: ESOPHAGOGASTRODUODENOSCOPY (EGD) WITH PROPOFOL;  Surgeon: Harvel Quale, MD;  Location: [facility];  Service: Gastroenterology;  Laterality: N/A;   IR GASTROSTOMY TUBE MOD SED  06/04/2021   IR GASTROSTOMY TUBE REMOVAL  07/24/2021   LEFT HEART CATH AND CORONARY ANGIOGRAPHY N/A 05/25/2020   Procedure: LEFT HEART CATH AND CORONARY ANGIOGRAPHY;  Surgeon: Martinique, Peter M, MD;  Location: [facility];  Service: Cardiovascular;  Laterality: N/A;    POLYPECTOMY  07/20/2020   Procedure: POLYPECTOMY INTESTINAL;  Surgeon: Harvel Quale, MD;  Location: [facility];  Service: Gastroenterology;;   TUMOR EXCISION Right ~ 1999   "side of my upper head"    Social History:  reports that he quit smoking about 4 months ago. His smoking use included cigarettes. He started smoking about 55 years ago. He has a 72.00 pack-year smoking history. He has never used smokeless tobacco. He reports that he does not currently use alcohol. He reports that he does not use drugs.   Allergies  Allergen Reactions   Gabapentin Anxiety    Unknown reaction   Metformin And Related Rash    Family History  Problem Relation Age of Onset   CAD Father    Lung cancer Brother        smoked   Cancer Brother        lung   Leukemia Sister    Dementia Sister    Stroke Mother    Emphysema Sister    Cancer Brother        lung     Prior to Admission medications   Medication Sig Start Date End Date Taking? Authorizing Provider  albuterol (VENTOLIN HFA) 108 (90 Base) MCG/ACT inhaler INHALE 2 PUFFS EVERY 6 HOURS AS NEEEDED Patient taking differently: Inhale 2 puffs into the lungs every 6 (six) hours as needed for wheezing or shortness of breath. 11/02/19   [name], FNP  ALPRAZolam Duanne Moron) 0.5 MG tablet Take 1 tablet (0.5 mg total) by mouth 2 (two) times daily as needed. for anxiety 09/27/21   [name], FNP  amiodarone (PACERONE) 200 MG tablet Take 1 tablet by mouth once daily 11/11/21   [name] A, FNP  amLODipine (NORVASC) 5 MG tablet Take 1 tablet (5 mg total) by mouth daily. 10/03/21   [name] A, FNP  aspirin EC 81 MG EC tablet Take 1 tablet (81 mg total) by mouth daily. Swallow whole. 09/05/21   Phillips Grout, MD  blood glucose meter kit and supplies Dispense based on patient and insurance preference. Use up to four times daily as directed. (FOR ICD-10 E10.9, E11.9). 07/05/21   [name], FNP  Blood Glucose Monitoring Suppl  (ACCU-CHEK GUIDE ME) w/Device KIT Check BS daily Dx E11.9 05/16/20   [name] A, FNP  budesonide (PULMICORT) 0.5 MG/2ML nebulizer solution Take 2 mLs (0.5 mg total) by nebulization 2 (two) times daily. 07/05/21   Hawks, Christy A, FNP  ELIQUIS 5 MG TABS tablet Take 1 tablet (5 mg total) by mouth 2 (two) times daily. 08/09/21   [name], FNP  furosemide (LASIX) 40 MG tablet Take 1.5 tablets (60 mg total) by mouth daily. 09/04/21 10/04/21  Phillips Grout, MD  glucose blood (ONE TOUCH ULTRA TEST) test strip USE TO CHECK GLUCOSE ONCE DAILY 02/11/18   Terald Sleeper, PA-C  ipratropium-albuterol (DUONEB) 0.5-2.5 (3) MG/3ML SOLN Take 3 mLs by nebulization every 6 (six) hours as needed (asthma).    [provider]  isosorbide mononitrate (ISMO) 10 MG tablet Take 1 tablet by mouth twice daily 11/11/21   [name] A, FNP  losartan (COZAAR) 100 MG tablet Take 1 tablet (100 mg total) by mouth daily. 08/14/21   [name], FNP  metoprolol tartrate (LOPRESSOR) 25 MG tablet Take 1 tablet (25 mg total) by mouth 2 (two) times daily. 08/26/21   [name], FNP  nitroGLYCERIN (NITROSTAT) 0.4 MG SL tablet Place 1 tablet (0.4 mg total) under the tongue every 5 (five) minutes as needed for chest pain. 10/04/21   [name], FNP  omeprazole (PRILOSEC) 20 MG capsule Take 20 mg by mouth daily. 07/05/21   [provider]  OXYGEN Inhale 3-4 L into the lungs daily.    [provider]  potassium chloride (KLOR-CON) 20 MEQ packet Take 20 mEq by mouth daily. 09/04/21 10/04/21  Phillips Grout, MD  rosuvastatin (CRESTOR) 20 MG tablet Take 1 tablet (20 mg total) by mouth daily. 10/03/21 04/01/22  [name], FNP    Physical Exam: BP (!) 152/58   Pulse 86   Temp 98.7 F (37.1 C) (Oral)   Resp (!) 22   Ht 5' 9"  (1.753 m)   Wt 81.6 kg   SpO2 98%   BMI 26.58 kg/m   General: 76 y.o. year-old male ill appearing, but in no acute distress.  Alert and oriented x3. HEENT: NCAT,  EOMI Neck: Supple, trachea medial Cardiovascular: Regular rate and rhythm with no rubs or gallops.  No thyromegaly or JVD noted.  No lower extremity edema. 2/4 pulses in all 4 extremities. Respiratory: Tachypnea, diffuse expiratory wheezes with decreased breath sounds in lower lobes bilaterally.  Patient could barely complete a sentence without pausing due to shortness of breath  Abdomen: Soft, nontender nondistended with normal bowel sounds x4 quadrants. Muskuloskeletal: No cyanosis, clubbing or edema noted bilaterally Neuro: CN II-XII intact, strength 5/5 x 4, sensation, reflexes intact Skin: No ulcerative lesions noted or rashes Psychiatry: Judgement and insight appear normal. Mood is appropriate for condition and setting          Labs on Admission:  Basic Metabolic Panel: Recent Labs  Lab 11/22/21 2030  NA 139  K 3.1*  CL 99  CO2 30  GLUCOSE 138*  BUN 25*  CREATININE 1.79*  CALCIUM 8.6*   Liver Function Tests: Recent Labs  Lab 11/22/21 2030  AST 14*  ALT 11  ALKPHOS 100  BILITOT 0.7  PROT 7.4  ALBUMIN 3.8   No results for input(s): "LIPASE", "AMYLASE" in the last 168 hours. No results for input(s): "AMMONIA" in the last 168 hours. CBC: Recent Labs  Lab 11/22/21 2030  WBC 5.1  NEUTROABS 3.3  HGB 12.3*  HCT 37.8*  MCV 80.4  PLT 320   Cardiac Enzymes: No results for input(s): "CKTOTAL", "CKMB", "CKMBINDEX", "TROPONINI" in the last 168 hours.  BNP (last 3 results) Recent Labs    08/14/21 1531 08/31/21 1345 11/22/21 2030  BNP 296.5* 250.0* 130.0*    ProBNP (last 3 results) No results for input(s): "PROBNP" in the last 8760 hours.  CBG: No results for input(s): "GLUCAP" in the last 168 hours.  Radiological Exams on Admission: DG Chest Port 1 View  Result Date: 11/22/2021 CLINICAL DATA:  Shortness of breath beginning 2 days ago. EXAM: PORTABLE CHEST 1 VIEW COMPARISON:  Two-view chest x-ray 09/27/2021 FINDINGS: Heart is mildly enlarged.  Atherosclerotic calcifications are again seen at the aortic arch. Mild edema is present. Significant  effusion. No airspace consolidation. Mild degenerative changes are again seen in the thoracic spine. IMPRESSION: Mild cardiomegaly and mild edema, compatible with congestive heart failure. Electronically Signed   By: San Morelle M.D.   On: 11/22/2021 21:03    EKG: I independently viewed the EKG done and my findings are as followed: Normal sinus rhythm at a rate of 73 bpm.  LVH with IVCD and secondary repolarization abnormality, QTc 504 ms  Assessment/Plan Present on Admission:  Acute on chronic diastolic CHF (congestive heart failure) (HCC)  COPD exacerbation (HCC)  Hypokalemia  Elevated brain natriuretic peptide (BNP) level  Essential hypertension  GERD (gastroesophageal reflux disease)  Coronary artery disease  Principal Problem:   Acute on chronic diastolic CHF (congestive heart failure) (HCC) Active Problems:   Coronary artery disease   COPD exacerbation (HCC)   Essential hypertension   GERD (gastroesophageal reflux disease)   Elevated brain natriuretic peptide (BNP) level   Elevated troponin   Hypokalemia   Mixed hyperlipidemia   Acute on chronic respiratory failure with hypoxia (HCC)   Prolonged QT interval  Acute on chronic diastolic CHF Elevated BNP BNP was 130 Chest x-ray was suggestive of CHF Continue total input/output, daily weights and fluid restriction Continue IV Lasix 40 twice daily Continue Cardiac diet  Echocardiogram done on 09/01/2021 showed LVEF of 55 to 60%.  RWMA.  Mild LVH.  G1 DD.  Echocardiogram will be done in the morning  Cardiology will be consulted and we shall await further recommendations Patient will be admitted to [facility] due to no cardiology at [facility] on weekends.  Please notify cardiology team when patient arrives at [facility].  Acute on chronic respiratory failure with hypoxia due to COPD exacerbation Continue duo nebs, Mucinex,  Solu-Medrol, Pulmicort. Continue Protonix to prevent steroid-induced ulcer Continue incentive spirometry and flutter valve Continue supplemental oxygen to maintain O2 sat > 94% with plan to wean patient to baseline oxygen need as tolerated  Hypokalemia K+ 3.1, this will be replenished  Elevated troponin possibly secondary to type II demand ischemia HS troponin x 2 - 108 > 120, patient denies any chest pain Continue to trend troponin  Prolonged QTc( 504) Avoid QT prolonging drugs Potassium will be replenished Magnesium was given  Continue to monitor patient and treat accordingly  Essential hypertension Continue amlodipine, losartan  Mixed hyperlipidemia Continue Crestor  Chronic atrial fibrillation Continue Eliquis and amiodarone  GERD Continue Protonix  CAD status post stent placement Continue aspirin, Crestor   DVT prophylaxis: Eliquis  Code Status: Full code  Consults: Cardiology  Family Communication: None at bedside  Severity of Illness: The appropriate patient status for this patient is INPATIENT. Inpatient status is judged to be reasonable and necessary in order to provide the required intensity of service to ensure the patient's safety. The patient's presenting symptoms, physical exam findings, and initial radiographic and laboratory data in the context of their chronic comorbidities is felt to place them at high risk for further clinical deterioration. Furthermore, it is not anticipated that the patient will be medically stable for discharge from the hospital within 2 midnights of admission.   * I certify that at the point of admission it is my clinical judgment that the patient will require inpatient hospital care spanning beyond 2 midnights from the point of admission due to high intensity of service, high risk for further deterioration and high frequency of surveillance required.*  Author: Bernadette Hoit, DO 11/23/2021 1:27 AM  For on call review  [email or web address].

## 2021-11-23 ENCOUNTER — Other Ambulatory Visit (HOSPITAL_COMMUNITY): Payer: Medicare HMO

## 2021-11-23 ENCOUNTER — Encounter (HOSPITAL_COMMUNITY): Payer: Self-pay | Admitting: Family Medicine

## 2021-11-23 DIAGNOSIS — I509 Heart failure, unspecified: Secondary | ICD-10-CM

## 2021-11-23 DIAGNOSIS — R9431 Abnormal electrocardiogram [ECG] [EKG]: Secondary | ICD-10-CM

## 2021-11-23 DIAGNOSIS — I5033 Acute on chronic diastolic (congestive) heart failure: Secondary | ICD-10-CM | POA: Diagnosis not present

## 2021-11-23 LAB — CBC
HCT: 41.1 % (ref 39.0–52.0)
Hemoglobin: 13.2 g/dL (ref 13.0–17.0)
MCH: 26.1 pg (ref 26.0–34.0)
MCHC: 32.1 g/dL (ref 30.0–36.0)
MCV: 81.4 fL (ref 80.0–100.0)
Platelets: 310 10*3/uL (ref 150–400)
RBC: 5.05 MIL/uL (ref 4.22–5.81)
RDW: 13.9 % (ref 11.5–15.5)
WBC: 4.1 10*3/uL (ref 4.0–10.5)
nRBC: 0 % (ref 0.0–0.2)

## 2021-11-23 LAB — COMPREHENSIVE METABOLIC PANEL
ALT: 12 U/L (ref 0–44)
AST: 15 U/L (ref 15–41)
Albumin: 3.6 g/dL (ref 3.5–5.0)
Alkaline Phosphatase: 106 U/L (ref 38–126)
Anion gap: 10 (ref 5–15)
BUN: 24 mg/dL — ABNORMAL HIGH (ref 8–23)
CO2: 31 mmol/L (ref 22–32)
Calcium: 8.3 mg/dL — ABNORMAL LOW (ref 8.9–10.3)
Chloride: 97 mmol/L — ABNORMAL LOW (ref 98–111)
Creatinine, Ser: 1.67 mg/dL — ABNORMAL HIGH (ref 0.61–1.24)
GFR, Estimated: 43 mL/min — ABNORMAL LOW (ref >60)
Glucose, Bld: 265 mg/dL — ABNORMAL HIGH (ref 70–99)
Potassium: 3.6 mmol/L (ref 3.5–5.1)
Sodium: 138 mmol/L (ref 135–145)
Total Bilirubin: 0.5 mg/dL (ref 0.3–1.2)
Total Protein: 7.1 g/dL (ref 6.5–8.1)

## 2021-11-23 LAB — TROPONIN I (HIGH SENSITIVITY)
Troponin I (High Sensitivity): 120 ng/L (ref <18)
Troponin I (High Sensitivity): 110 ng/L (ref <18)

## 2021-11-23 LAB — PHOSPHORUS: Phosphorus: 3.7 mg/dL (ref 2.5–4.6)

## 2021-11-23 LAB — MAGNESIUM: Magnesium: 2.3 mg/dL (ref 1.7–2.4)

## 2021-11-23 LAB — APTT: aPTT: 27 seconds (ref 24–36)

## 2021-11-23 MED ORDER — NITROGLYCERIN 0.4 MG SL SUBL: 0.4000 mg | SUBLINGUAL_TABLET | SUBLINGUAL | Status: DC | PRN

## 2021-11-23 MED ORDER — ACETAMINOPHEN 650 MG RE SUPP: 650.0000 mg | Freq: Four times a day (QID) | RECTAL | Status: DC | PRN

## 2021-11-23 MED ORDER — BUDESONIDE 0.5 MG/2ML IN SUSP
0.5000 mg | Freq: Two times a day (BID) | RESPIRATORY_TRACT | Status: DC
Start: 2021-11-23 — End: 2021-11-26
  Administered 2021-11-23 – 2021-11-26 (×7): 0.5 mg via RESPIRATORY_TRACT
  Filled 2021-11-23 (×7): qty 2

## 2021-11-23 MED ORDER — AMIODARONE HCL 200 MG PO TABS
200.0000 mg | ORAL_TABLET | Freq: Every day | ORAL | Status: DC
  Administered 2021-11-23 – 2021-11-26 (×4): 200 mg via ORAL
  Filled 2021-11-23 (×4): qty 1

## 2021-11-23 MED ORDER — ACETAMINOPHEN 325 MG PO TABS
650.0000 mg | ORAL_TABLET | Freq: Four times a day (QID) | ORAL | Status: DC | PRN
  Administered 2021-11-23 – 2021-11-24 (×2): 650 mg via ORAL
  Filled 2021-11-23 (×2): qty 2

## 2021-11-23 MED ORDER — AMLODIPINE BESYLATE 5 MG PO TABS
5.0000 mg | ORAL_TABLET | Freq: Every day | ORAL | Status: DC
  Administered 2021-11-23 – 2021-11-26 (×4): 5 mg via ORAL
  Filled 2021-11-23 (×4): qty 1

## 2021-11-23 MED ORDER — ENOXAPARIN SODIUM 40 MG/0.4ML IJ SOSY: 40.0000 mg | PREFILLED_SYRINGE | INTRAMUSCULAR | Status: DC

## 2021-11-23 MED ORDER — LOSARTAN POTASSIUM 50 MG PO TABS
100.0000 mg | ORAL_TABLET | Freq: Every day | ORAL | Status: DC
  Administered 2021-11-23: 100 mg via ORAL
  Filled 2021-11-23: qty 4

## 2021-11-23 MED ORDER — IPRATROPIUM-ALBUTEROL 0.5-2.5 (3) MG/3ML IN SOLN
3.0000 mL | Freq: Three times a day (TID) | RESPIRATORY_TRACT | Status: DC
  Administered 2021-11-23 – 2021-11-26 (×10): 3 mL via RESPIRATORY_TRACT
  Filled 2021-11-23 (×4): qty 3
  Filled 2021-11-23: qty 6
  Filled 2021-11-23 (×6): qty 3

## 2021-11-23 MED ORDER — POTASSIUM CHLORIDE CRYS ER 20 MEQ PO TBCR
40.0000 meq | EXTENDED_RELEASE_TABLET | Freq: Once | ORAL | Status: AC
  Administered 2021-11-23: 40 meq via ORAL
  Filled 2021-11-23: qty 2

## 2021-11-23 MED ORDER — APIXABAN 5 MG PO TABS
5.0000 mg | ORAL_TABLET | Freq: Two times a day (BID) | ORAL | Status: DC
  Administered 2021-11-23 – 2021-11-26 (×7): 5 mg via ORAL
  Filled 2021-11-23 (×7): qty 1

## 2021-11-23 MED ORDER — ROSUVASTATIN CALCIUM 20 MG PO TABS
20.0000 mg | ORAL_TABLET | Freq: Every day | ORAL | Status: DC
  Administered 2021-11-23 – 2021-11-26 (×4): 20 mg via ORAL
  Filled 2021-11-23 (×4): qty 1

## 2021-11-23 MED ORDER — FUROSEMIDE 10 MG/ML IJ SOLN
40.0000 mg | Freq: Two times a day (BID) | INTRAMUSCULAR | Status: DC
  Administered 2021-11-23 – 2021-11-25 (×6): 40 mg via INTRAVENOUS
  Filled 2021-11-23 (×6): qty 4

## 2021-11-23 MED ORDER — DOXYCYCLINE HYCLATE 100 MG PO TABS
100.0000 mg | ORAL_TABLET | Freq: Two times a day (BID) | ORAL | Status: DC
  Administered 2021-11-23 – 2021-11-26 (×6): 100 mg via ORAL
  Filled 2021-11-23 (×6): qty 1

## 2021-11-23 MED ORDER — ASPIRIN 81 MG PO TBEC
81.0000 mg | DELAYED_RELEASE_TABLET | Freq: Every day | ORAL | Status: DC
  Administered 2021-11-23 – 2021-11-26 (×4): 81 mg via ORAL
  Filled 2021-11-23 (×4): qty 1

## 2021-11-23 NOTE — ED Notes (Signed)
Called floor to give report- RN with pt at this time and will call back for report.

## 2021-11-23 NOTE — ED Notes (Signed)
Called floor to give report- RN in pt room at this time and will call back for report

## 2021-11-23 NOTE — Progress Notes (Signed)
PROGRESS NOTE     Ronald [name], is a 76 y.o. male, DOB - Oct 27, 1945, EPP:[id number]  Admit date - 11/22/2021   Admitting Physician Rithika Seel Denton Brick, MD  Outpatient Primary MD for the patient is [name], FNP  LOS - 1  Chief Complaint  Patient presents with   Shortness of Breath        Brief Narrative:   76 y.o. male with medical history significant of chronic respiratory failure on 3 LPM of oxygen, COPD, HFpEF, hypertension, hyperlipidemia, GERD, CAD s/p stent placement admitted on 11/22/2021 with worsening respiratory status with concerns for acute COPD exacerbation and acute CHF exacerbation with flat troponins    -Assessment and Plan:  1)Acute on chronic diastolic CHF -Troponin 606>>301>>>601, EKG with sinus rhythm, LVH without acute ACS type findings -BNP 130 -Chest x-ray with mild cardiomegaly and mild edema compatible with CHF -Continue IV Lasix, daily weights and fluid input and output monitoring -Suspect elevated troponin secondary to demand ischemia in the setting of CHF exacerbation and COPD exacerbation with worsening hypoxia -Patient is on aspirin and Crestor, bronchospasms concerns precludes beta-blocker use at this time  2)AKI----acute kidney injury   creatinine on admission= 1.79,  baseline creatinine = 1.1 to 1.2    , -Monitor creatinine closely with diuresis - renally adjust medications, avoid nephrotoxic agents / dehydration  / hypotension  3) acute on chronic hypoxic respiratory failure secondary to COPD exacerbation and compounded by CHF exacerbation - he was exposed to an insecticide spray at a friend's house about 2 days prior to admission and developed profuse vaginal symptoms since then -Continue IV steroids, bronchodilators as ordered -Patient has home O2 but does not really use it much because he smokes 1-1/2 to 2 packs daily -Continue supplemental oxygen  4) hypokalemia--- replace and recheck  5)HTN--- continue amlodipine hold PTA losartan  given concerns about renal function at this time with diuresis  6) chronic A-fib--- continue Eliquis for stroke prophylaxis and midodrine for rate control, LFTs are not elevated  7) history of CAD with prior angioplasty/stents--- please see #1 above -Presentation not consistent with ACS -Continue aspirin and Crestor  8)Social/Ethics-he is a full code, -Patient lives with his ex-wife, his son is his primary contact   Disposition/Need for in-Hospital Stay- patient unable to be discharged at this time due to -- -Worsening hypoxia concerning for bronchospasms with COPD exacerbation and diastolic CHF exacerbation requiring IV steroids and IV diuretics with need to monitor renal function closely  Status is: Inpatient   Disposition: The patient is from: Home              Anticipated d/c is to: Home              Anticipated d/c date is: 2 days              Patient currently is not medically stable to d/c. Barriers: Not Clinically Stable-   Code Status :  -  Code Status: Full Code   Family Communication:    NA (patient is alert, awake and coherent)  -Patient lives with his ex-wife, his son is his primary contact however  DVT Prophylaxis  :   - SCDs   SCDs Start: 11/23/21 0449 apixaban (ELIQUIS) tablet 5 mg   Lab Results  Component Value Date   PLT 310 11/23/2021    Inpatient Medications  Scheduled Meds:  amiodarone  200 mg Oral Daily   amLODipine  5 mg Oral Daily   apixaban  5 mg Oral  BID   aspirin EC  81 mg Oral Daily   budesonide  0.5 mg Nebulization BID   dextromethorphan-guaiFENesin  1 tablet Oral BID   doxycycline  100 mg Oral Q12H   furosemide  40 mg Intravenous Q12H   ipratropium-albuterol  3 mL Nebulization TID   losartan  100 mg Oral Daily   methylPREDNISolone (SOLU-MEDROL) injection  40 mg Intravenous Q12H   pantoprazole  40 mg Oral Daily   rosuvastatin  20 mg Oral Daily   Continuous Infusions: PRN Meds:.acetaminophen **OR** acetaminophen, ipratropium-albuterol,  nitroGLYCERIN   Anti-infectives (From admission, onward)    Start     Dose/Rate Route Frequency Ordered Stop   11/23/21 2200  doxycycline (VIBRA-TABS) tablet 100 mg        100 mg Oral Every 12 hours 11/23/21 1850           Subjective: Ronald [name] today has no fevers, no emesis,  No chest pain,   -Cough, shortness of breath, wheezing    Objective: Vitals:   11/23/21 1145 11/23/21 1200 11/23/21 1240 11/23/21 1445  BP:  (!) 114/40 (!) 148/67   Pulse: (!) 59 69 84   Resp: (!) 29 (!) 25 20   Temp:  97.9 F (36.6 C) 97.9 F (36.6 C)   TempSrc:  Oral Oral   SpO2: 98% 99% 98% 94%  Weight:   81.2 kg   Height:   '5\' 9"'$  (1.753 m)     Intake/Output Summary (Last 24 hours) at 11/23/2021 1850 Last data filed at 11/22/2021 2334 Gross per 24 hour  Intake 146.69 ml  Output --  Net 146.69 ml   Filed Weights   11/22/21 2023 11/23/21 1240  Weight: 81.6 kg 81.2 kg    Physical Exam  Gen:- Awake Alert, conversational dyspnea  HEENT:- [place].AT, No sclera icterus Nose- [place] 3L/min Neck-Supple Neck,  Lungs-diminished breath sounds with scattered wheezes  CV- S1, S2 normal, regular  Abd-  +ve B.Sounds, Abd Soft, No tenderness,    Extremity/Skin:- +ve  edema, pedal pulses present  Psych-affect is appropriate, oriented x3 Neuro-generalized weakness no new focal deficits, no tremors  Data Reviewed: I have personally reviewed following labs and imaging studies  CBC: Recent Labs  Lab 11/22/21 2030 11/23/21 0555  WBC 5.1 4.1  NEUTROABS 3.3  --   HGB 12.3* 13.2  HCT 37.8* 41.1  MCV 80.4 81.4  PLT 320 458   Basic Metabolic Panel: Recent Labs  Lab 11/22/21 2030 11/23/21 0555  NA 139 138  K 3.1* 3.6  CL 99 97*  CO2 30 31  GLUCOSE 138* 265*  BUN 25* 24*  CREATININE 1.79* 1.67*  CALCIUM 8.6* 8.3*  MG  --  2.3  PHOS  --  3.7   GFR: Estimated Creatinine Clearance: 39.4 mL/min (A) (by C-G formula based on SCr of 1.67 mg/dL (H)). Liver Function Tests: Recent Labs  Lab  11/22/21 2030 11/23/21 0555  AST 14* 15  ALT 11 12  ALKPHOS 100 106  BILITOT 0.7 0.5  PROT 7.4 7.1  ALBUMIN 3.8 3.6   Cardiac Enzymes: No results for input(s): "CKTOTAL", "CKMB", "CKMBINDEX", "TROPONINI" in the last 168 hours. BNP (last 3 results) No results for input(s): "PROBNP" in the last 8760 hours. HbA1C: No results for input(s): "HGBA1C" in the last 72 hours. Sepsis Labs: '@LABRCNTIP'$ (procalcitonin:4,lacticidven:4) )No results found for this or any previous visit (from the past 240 hour(s)).    Radiology Studies: DG Chest Port 1 View  Result Date: 11/22/2021 CLINICAL DATA:  Shortness  of breath beginning 2 days ago. EXAM: PORTABLE CHEST 1 VIEW COMPARISON:  Two-view chest x-ray 09/27/2021 FINDINGS: Heart is mildly enlarged. Atherosclerotic calcifications are again seen at the aortic arch. Mild edema is present. Significant effusion. No airspace consolidation. Mild degenerative changes are again seen in the thoracic spine. IMPRESSION: Mild cardiomegaly and mild edema, compatible with congestive heart failure. Electronically Signed   By: San Morelle M.D.   On: 11/22/2021 21:03     Scheduled Meds:  amiodarone  200 mg Oral Daily   amLODipine  5 mg Oral Daily   apixaban  5 mg Oral BID   aspirin EC  81 mg Oral Daily   budesonide  0.5 mg Nebulization BID   dextromethorphan-guaiFENesin  1 tablet Oral BID   doxycycline  100 mg Oral Q12H   furosemide  40 mg Intravenous Q12H   ipratropium-albuterol  3 mL Nebulization TID   losartan  100 mg Oral Daily   methylPREDNISolone (SOLU-MEDROL) injection  40 mg Intravenous Q12H   pantoprazole  40 mg Oral Daily   rosuvastatin  20 mg Oral Daily   Continuous Infusions:   LOS: 1 day    Roxan Hockey M.D on 11/23/2021 at 6:50 PM  Go to [email or web address] - for contact info  [facility] - Office  [phone]  If 7PM-7AM, please contact night-coverage [email or web address] 11/23/2021, 6:50 PM

## 2021-11-23 NOTE — ED Notes (Signed)
Hospitalist at bedside 

## 2021-11-23 NOTE — ED Notes (Signed)
Pt has had 700 mLs UOP

## 2021-11-24 DIAGNOSIS — I5033 Acute on chronic diastolic (congestive) heart failure: Secondary | ICD-10-CM | POA: Diagnosis not present

## 2021-11-24 LAB — RENAL FUNCTION PANEL
Albumin: 3.8 g/dL (ref 3.5–5.0)
Anion gap: 10 (ref 5–15)
BUN: 34 mg/dL — ABNORMAL HIGH (ref 8–23)
CO2: 31 mmol/L (ref 22–32)
Calcium: 9.2 mg/dL (ref 8.9–10.3)
Chloride: 96 mmol/L — ABNORMAL LOW (ref 98–111)
Creatinine, Ser: 1.72 mg/dL — ABNORMAL HIGH (ref 0.61–1.24)
GFR, Estimated: 41 mL/min — ABNORMAL LOW (ref >60)
Glucose, Bld: 270 mg/dL — ABNORMAL HIGH (ref 70–99)
Phosphorus: 3.3 mg/dL (ref 2.5–4.6)
Potassium: 3.1 mmol/L — ABNORMAL LOW (ref 3.5–5.1)
Sodium: 137 mmol/L (ref 135–145)

## 2021-11-24 MED ORDER — POTASSIUM CHLORIDE CRYS ER 20 MEQ PO TBCR
40.0000 meq | EXTENDED_RELEASE_TABLET | ORAL | Status: AC
  Administered 2021-11-24 (×2): 40 meq via ORAL
  Filled 2021-11-24 (×2): qty 2

## 2021-11-24 MED ORDER — HYDRALAZINE HCL 20 MG/ML IJ SOLN
10.0000 mg | Freq: Four times a day (QID) | INTRAMUSCULAR | Status: DC | PRN
  Administered 2021-11-24 – 2021-11-25 (×2): 10 mg via INTRAVENOUS
  Filled 2021-11-24 (×2): qty 1

## 2021-11-24 MED ORDER — ORAL CARE MOUTH RINSE: 15.0000 mL | OROMUCOSAL | Status: DC | PRN

## 2021-11-24 NOTE — Progress Notes (Signed)
PROGRESS NOTE     Ronald [name], is a 76 y.o. male, DOB - 1946-01-14, VHQ:[id number]  Admit date - 11/22/2021   Admitting Physician Ronald Storey Denton Brick, MD  Outpatient Primary MD for the patient is Ronald Balloon, FNP  LOS - 2  Chief Complaint  Patient presents with   Shortness of Breath        Brief Narrative:   76 y.o. male with medical history significant of chronic respiratory failure on 3 LPM of oxygen, COPD, HFpEF, hypertension, hyperlipidemia, GERD, CAD s/p stent placement admitted on 11/22/2021 with worsening respiratory status with concerns for acute COPD exacerbation and acute CHF exacerbation with flat troponins    -Assessment and Plan:  1)Acute on chronic diastolic CHF -Troponin 413>>244>>>010, EKG with sinus rhythm, LVH without acute ACS type findings -BNP 130 -Chest x-ray with mild cardiomegaly and mild edema compatible with CHF -Continue IV Lasix, daily weights and fluid input and output monitoring -Suspect elevated troponin secondary to demand ischemia in the setting of CHF exacerbation and COPD exacerbation with worsening hypoxia -Patient is on aspirin and Crestor, bronchospasms concerns precludes beta-blocker use at this time -Voiding okay -Weight appears unchanged  2)AKI----acute kidney injury   creatinine on admission= 1.79,  baseline creatinine = 1.1 to 1.2    , -Monitor creatinine closely with diuresis - renally adjust medications, avoid nephrotoxic agents / dehydration  / hypotension  3) acute on chronic hypoxic respiratory failure secondary to COPD exacerbation and compounded by CHF exacerbation - he was exposed to an insecticide spray at a friend's house about 2 days prior to admission and developed bronchial spasms symptoms since then -Cough and wheezing improving, dyspnea improving -Continue IV steroids, bronchodilators as ordered -Patient has home O2 but does not really use it much because he smokes 1-1/2 to 2 packs daily -Continue supplemental  oxygen  4) hypokalemia--- replace and recheck  5)HTN--- continue amlodipine hold PTA losartan given concerns about renal function at this time with diuresis  6) chronic A-fib--- continue Eliquis for stroke prophylaxis and midodrine for rate control, LFTs are not elevated  7) history of CAD with prior angioplasty/stents--- please see #1 above -Presentation not consistent with ACS -Continue aspirin and Crestor  8)Social/Ethics-he is a full code, -Patient lives with his ex-wife, his son is his primary contact   Disposition/Need for in-Hospital Stay- patient unable to be discharged at this time due to -- -Worsening hypoxia concerning for bronchospasms with COPD exacerbation and diastolic CHF exacerbation requiring IV steroids and IV diuretics with need to monitor renal function closely  Status is: Inpatient   Disposition: The patient is from: Home              Anticipated d/c is to: Home              Anticipated d/c date is: 2 days              Patient currently is not medically stable to d/c. Barriers: Not Clinically Stable-   Code Status :  -  Code Status: Full Code   Family Communication:    NA (patient is alert, awake and coherent)  -Patient lives with his ex-wife, his son is his primary contact however  DVT Prophylaxis  :   - SCDs   SCDs Start: 11/23/21 0449 apixaban (ELIQUIS) tablet 5 mg   Lab Results  Component Value Date   PLT 310 11/23/2021    Inpatient Medications  Scheduled Meds:  amiodarone  200 mg Oral Daily   amLODipine  5 mg Oral Daily   apixaban  5 mg Oral BID   aspirin EC  81 mg Oral Daily   budesonide  0.5 mg Nebulization BID   dextromethorphan-guaiFENesin  1 tablet Oral BID   doxycycline  100 mg Oral Q12H   furosemide  40 mg Intravenous Q12H   ipratropium-albuterol  3 mL Nebulization TID   methylPREDNISolone (SOLU-MEDROL) injection  40 mg Intravenous Q12H   pantoprazole  40 mg Oral Daily   rosuvastatin  20 mg Oral Daily   Continuous  Infusions: PRN Meds:.acetaminophen **OR** acetaminophen, ipratropium-albuterol, nitroGLYCERIN   Anti-infectives (From admission, onward)    Start     Dose/Rate Route Frequency Ordered Stop   11/23/21 2200  doxycycline (VIBRA-TABS) tablet 100 mg        100 mg Oral Every 12 hours 11/23/21 1850           Subjective: Ronald [name] today has no fevers, no emesis,  No chest pain,   -Cough improving -Dyspnea on exertion persist -wheezing less   Objective: Vitals:   11/24/21 0629 11/24/21 0732 11/24/21 0736 11/24/21 1344  BP: (!) 161/53   (!) 160/74  Pulse: 72   84  Resp:    20  Temp: 98.3 F (36.8 C)   98.3 F (36.8 C)  TempSrc: Oral     SpO2: 95% 97% 97% 90%  Weight:      Height:        Intake/Output Summary (Last 24 hours) at 11/24/2021 1708 Last data filed at 11/24/2021 1300 Gross per 24 hour  Intake 600 ml  Output 1050 ml  Net -450 ml   Filed Weights   11/22/21 2023 11/23/21 1240 11/24/21 0500  Weight: 81.6 kg 81.2 kg 81.2 kg    Physical Exam  Gen:- Awake Alert, no conversational dyspnea but has dyspnea on exertion  HEENT:- [place].AT, No sclera icterus Nose- [place] 3L/min Neck-Supple Neck,  Lungs--improving air movement, much less wheezing CV- S1, S2 normal, regular  Abd-  +ve B.Sounds, Abd Soft, No tenderness,    Extremity/Skin:- +ve  edema, pedal pulses present  Psych-affect is appropriate, oriented x3 Neuro-generalized weakness no new focal deficits, no tremors  Data Reviewed: I have personally reviewed following labs and imaging studies  CBC: Recent Labs  Lab 11/22/21 2030 11/23/21 0555  WBC 5.1 4.1  NEUTROABS 3.3  --   HGB 12.3* 13.2  HCT 37.8* 41.1  MCV 80.4 81.4  PLT 320 025   Basic Metabolic Panel: Recent Labs  Lab 11/22/21 2030 11/23/21 0555 11/24/21 0541  NA 139 138 137  K 3.1* 3.6 3.1*  CL 99 97* 96*  CO2 '30 31 31  '$ GLUCOSE 138* 265* 270*  BUN 25* 24* 34*  CREATININE 1.79* 1.67* 1.72*  CALCIUM 8.6* 8.3* 9.2  MG  --  2.3  --   PHOS   --  3.7 3.3   GFR: Estimated Creatinine Clearance: 38.3 mL/min (A) (by C-G formula based on SCr of 1.72 mg/dL (H)). Liver Function Tests: Recent Labs  Lab 11/22/21 2030 11/23/21 0555 11/24/21 0541  AST 14* 15  --   ALT 11 12  --   ALKPHOS 100 106  --   BILITOT 0.7 0.5  --   PROT 7.4 7.1  --   ALBUMIN 3.8 3.6 3.8   Cardiac Enzymes: No results for input(s): "CKTOTAL", "CKMB", "CKMBINDEX", "TROPONINI" in the last 168 hours. BNP (last 3 results) No results for input(s): "PROBNP" in the last 8760 hours. HbA1C: No results for input(s): "HGBA1C"  in the last 72 hours. Sepsis Labs: '@LABRCNTIP'$ (procalcitonin:4,lacticidven:4) )No results found for this or any previous visit (from the past 240 hour(s)).    Radiology Studies: DG Chest Port 1 View  Result Date: 11/22/2021 CLINICAL DATA:  Shortness of breath beginning 2 days ago. EXAM: PORTABLE CHEST 1 VIEW COMPARISON:  Two-view chest x-ray 09/27/2021 FINDINGS: Heart is mildly enlarged. Atherosclerotic calcifications are again seen at the aortic arch. Mild edema is present. Significant effusion. No airspace consolidation. Mild degenerative changes are again seen in the thoracic spine. IMPRESSION: Mild cardiomegaly and mild edema, compatible with congestive heart failure. Electronically Signed   By: San Morelle M.D.   On: 11/22/2021 21:03     Scheduled Meds:  amiodarone  200 mg Oral Daily   amLODipine  5 mg Oral Daily   apixaban  5 mg Oral BID   aspirin EC  81 mg Oral Daily   budesonide  0.5 mg Nebulization BID   dextromethorphan-guaiFENesin  1 tablet Oral BID   doxycycline  100 mg Oral Q12H   furosemide  40 mg Intravenous Q12H   ipratropium-albuterol  3 mL Nebulization TID   methylPREDNISolone (SOLU-MEDROL) injection  40 mg Intravenous Q12H   pantoprazole  40 mg Oral Daily   rosuvastatin  20 mg Oral Daily   Continuous Infusions:   LOS: 2 days   Roxan Hockey M.D on 11/24/2021 at 5:08 PM  Go to [email or web address] - for  contact info  [facility] - Office  [phone]  If 7PM-7AM, please contact night-coverage [email or web address] 11/24/2021, 5:08 PM

## 2021-11-24 NOTE — Progress Notes (Signed)
Pt complains with SOB O2 94% BP 187/80 MD informed. PRN hydralazine ordered. RN informed. Respiratory called coming to give him breathing treatment. Encouraged deep breathing. IV lasix has been given. Will continue to monitor patient.

## 2021-11-24 NOTE — Progress Notes (Signed)
   11/24/21 2210  Assess: MEWS Score  SpO2 93 %  O2 Flow Rate (L/min) 3 L/min   Called RT due to notes indicate pt uses 3L/[place] at home. Oxygen set at 2L/[place]. RT recommended increase to home regimen to assist with sats 87-90%.

## 2021-11-25 ENCOUNTER — Inpatient Hospital Stay (HOSPITAL_COMMUNITY): Payer: Medicare HMO

## 2021-11-25 DIAGNOSIS — I5033 Acute on chronic diastolic (congestive) heart failure: Secondary | ICD-10-CM | POA: Diagnosis not present

## 2021-11-25 LAB — GLUCOSE, CAPILLARY
Glucose-Capillary: 302 mg/dL — ABNORMAL HIGH (ref 70–99)
Glucose-Capillary: 158 mg/dL — ABNORMAL HIGH (ref 70–99)
Glucose-Capillary: 323 mg/dL — ABNORMAL HIGH (ref 70–99)
Glucose-Capillary: 127 mg/dL — ABNORMAL HIGH (ref 70–99)

## 2021-11-25 LAB — BASIC METABOLIC PANEL
Anion gap: 8 (ref 5–15)
BUN: 43 mg/dL — ABNORMAL HIGH (ref 8–23)
CO2: 30 mmol/L (ref 22–32)
Calcium: 9.4 mg/dL (ref 8.9–10.3)
Chloride: 97 mmol/L — ABNORMAL LOW (ref 98–111)
Creatinine, Ser: 1.65 mg/dL — ABNORMAL HIGH (ref 0.61–1.24)
GFR, Estimated: 44 mL/min — ABNORMAL LOW (ref >60)
Glucose, Bld: 325 mg/dL — ABNORMAL HIGH (ref 70–99)
Potassium: 3.4 mmol/L — ABNORMAL LOW (ref 3.5–5.1)
Sodium: 135 mmol/L (ref 135–145)

## 2021-11-25 MED ORDER — INSULIN ASPART 100 UNIT/ML IJ SOLN
0.0000 [IU] | Freq: Three times a day (TID) | INTRAMUSCULAR | Status: DC
  Administered 2021-11-25 (×2): 11 [IU] via SUBCUTANEOUS
  Administered 2021-11-25 – 2021-11-26 (×2): 3 [IU] via SUBCUTANEOUS
  Administered 2021-11-26: 8 [IU] via SUBCUTANEOUS

## 2021-11-25 MED ORDER — FUROSEMIDE 10 MG/ML IJ SOLN
20.0000 mg | Freq: Two times a day (BID) | INTRAMUSCULAR | Status: DC
  Administered 2021-11-26: 20 mg via INTRAVENOUS
  Filled 2021-11-25: qty 2

## 2021-11-25 MED ORDER — INSULIN ASPART 100 UNIT/ML IJ SOLN: 0.0000 [IU] | Freq: Every day | INTRAMUSCULAR | Status: DC

## 2021-11-25 MED ORDER — INSULIN ASPART 100 UNIT/ML IJ SOLN
3.0000 [IU] | Freq: Three times a day (TID) | INTRAMUSCULAR | Status: DC
  Administered 2021-11-25 – 2021-11-26 (×3): 3 [IU] via SUBCUTANEOUS

## 2021-11-25 MED ORDER — POTASSIUM CHLORIDE CRYS ER 20 MEQ PO TBCR
40.0000 meq | EXTENDED_RELEASE_TABLET | ORAL | Status: AC
  Administered 2021-11-25 (×2): 40 meq via ORAL
  Filled 2021-11-25 (×2): qty 2

## 2021-11-25 MED ORDER — ALPRAZOLAM 0.5 MG PO TABS
0.5000 mg | ORAL_TABLET | Freq: Two times a day (BID) | ORAL | Status: DC | PRN
Start: 2021-11-25 — End: 2021-11-26
  Administered 2021-11-25 – 2021-11-26 (×3): 0.5 mg via ORAL
  Filled 2021-11-25 (×3): qty 1

## 2021-11-25 MED ORDER — INSULIN GLARGINE-YFGN 100 UNIT/ML ~~LOC~~ SOLN
12.0000 [IU] | Freq: Once | SUBCUTANEOUS | Status: AC
  Administered 2021-11-25: 12 [IU] via SUBCUTANEOUS
  Filled 2021-11-25: qty 0.12

## 2021-11-25 MED ORDER — TECHNETIUM TO 99M ALBUMIN AGGREGATED
4.0700 | Freq: Once | INTRAVENOUS | Status: AC | PRN
  Administered 2021-11-25: 4.07 via INTRAVENOUS

## 2021-11-25 NOTE — Progress Notes (Signed)
PROGRESS NOTE     Ronald [name], is a 76 y.o. male, DOB - Nov 10, 1945, IHK:[id number]  Admit date - 11/22/2021   Admitting Physician Ronald Cease Denton Brick, MD  Outpatient Primary MD for the patient is Ronald Balloon, FNP  LOS - 3  Chief Complaint  Patient presents with   Shortness of Breath        Brief Narrative:   76 y.o. male with medical history significant of chronic respiratory failure on 3 LPM of oxygen, COPD, HFpEF, hypertension, hyperlipidemia, GERD, CAD s/p stent placement admitted on 11/22/2021 with worsening respiratory status with concerns for acute COPD exacerbation and acute CHF exacerbation with flat troponins    -Assessment and Plan:  1)Acute on chronic diastolic CHF -Troponin [id number]>>>295, EKG with sinus rhythm, LVH without acute ACS type findings -BNP 130 -Chest x-ray with mild cardiomegaly and mild edema compatible with CHF -Continue IV Lasix, daily weights and fluid input and output monitoring -Suspect elevated troponin secondary to demand ischemia in the setting of CHF exacerbation and COPD exacerbation with worsening hypoxia -Patient is on aspirin and Crestor, bronchospasms concerns precludes beta-blocker use at this time -Voiding okay REDS CLIP --noted -Weight is trending down currently 177.4 pounds -Decrease Lasix to 20 mg  2)AKI----acute kidney injury   creatinine on admission= 1.79,  baseline creatinine = 1.1 to 1.2    , -Monitor creatinine closely with diuresis - renally adjust medications, avoid nephrotoxic agents / dehydration  / hypotension  3) acute on chronic hypoxic respiratory failure secondary to COPD exacerbation and compounded by CHF exacerbation - he was exposed to an insecticide spray at a friend's house about 2 days prior to admission and developed bronchial spasms symptoms since then -Cough and wheezing improving, dyspnea improving with IV Solu-Medrol And bronchodilators as ordered -Patient has home O2 but does not really use it much  because he smokes 1-1/2 to 2 packs daily -Continue supplemental oxygen -VQ scan without evidence of PE -Renal function precludes CTA chest study -Patient is on Eliquis for A-fib, patient thinks he is mostly compliant may have missed a few doses -The need to be compliant with Eliquis emphasized  4) hypokalemia--- replace and recheck  5)HTN--- continue amlodipine - hold PTA losartan given concerns about renal function at this time with diuresis  6) chronic A-fib--- continue Eliquis for stroke prophylaxis and midodrine for rate control, LFTs are not elevated  7) history of CAD with prior angioplasty/stents--- please see #1 above -Presentation not consistent with ACS -Continue aspirin and Crestor  8)Social/Ethics-he is a full code, -Patient lives with his ex-wife, his son is his primary contact   Disposition/Need for in-Hospital Stay- patient unable to be discharged at this time due to -- -Worsening hypoxia concerning for bronchospasms with COPD exacerbation and diastolic CHF exacerbation requiring IV steroids and IV diuretics with need to monitor renal function closely -Possible discharge on 11/26/2021 if continues to improve  Status is: Inpatient   Disposition: The patient is from: Home              Anticipated d/c is to: Home              Anticipated d/c date is: 2 days              Patient currently is not medically stable to d/c. Barriers: Not Clinically Stable-   Code Status :  -  Code Status: Full Code   Family Communication:    NA (patient is alert, awake and coherent)  -Patient lives with his  ex-wife, his son is his primary contact however -Spoke with patient's son and patient's niece on 11/25/2021  DVT Prophylaxis  :   - SCDs   SCDs Start: 11/23/21 0449 apixaban (ELIQUIS) tablet 5 mg   Lab Results  Component Value Date   PLT 310 11/23/2021    Inpatient Medications  Scheduled Meds:  amiodarone  200 mg Oral Daily   amLODipine  5 mg Oral Daily   apixaban  5 mg  Oral BID   aspirin EC  81 mg Oral Daily   budesonide  0.5 mg Nebulization BID   dextromethorphan-guaiFENesin  1 tablet Oral BID   doxycycline  100 mg Oral Q12H   [START ON 11/26/2021] furosemide  20 mg Intravenous Q12H   insulin aspart  0-15 Units Subcutaneous TID WC   insulin aspart  0-5 Units Subcutaneous QHS   insulin aspart  3 Units Subcutaneous TID WC   ipratropium-albuterol  3 mL Nebulization TID   methylPREDNISolone (SOLU-MEDROL) injection  40 mg Intravenous Q12H   pantoprazole  40 mg Oral Daily   rosuvastatin  20 mg Oral Daily   Continuous Infusions: PRN Meds:.acetaminophen **OR** acetaminophen, ALPRAZolam, hydrALAZINE, ipratropium-albuterol, nitroGLYCERIN, mouth rinse   Anti-infectives (From admission, onward)    Start     Dose/Rate Route Frequency Ordered Stop   11/23/21 2200  doxycycline (VIBRA-TABS) tablet 100 mg        100 mg Oral Every 12 hours 11/23/21 1850         Subjective: Ronald [name] today has no fevers, no emesis,  No chest pain,   -Cough improving -Dyspnea on exertion persist -wheezing less -Voiding well -  Objective: Vitals:   11/25/21 0722 11/25/21 0940 11/25/21 1230 11/25/21 1301  BP: (!) 148/62  122/89   Pulse: 98  (!) 108   Resp: 20  20   Temp: 97.8 F (36.6 C)  98.1 F (36.7 C)   TempSrc: Oral     SpO2: 94% 94% 98% 98%  Weight:      Height:        Intake/Output Summary (Last 24 hours) at 11/25/2021 1854 Last data filed at 11/25/2021 1809 Gross per 24 hour  Intake 720 ml  Output 1150 ml  Net -430 ml   Filed Weights   11/23/21 1240 11/24/21 0500 11/25/21 0500  Weight: 81.2 kg 81.2 kg 80.5 kg    Physical Exam  Gen:- Awake Alert, no conversational dyspnea but has dyspnea on exertion  HEENT:- Ronald [name].AT, No sclera icterus Nose- Ronald [name] 3L/min Neck-Supple Neck,  Lungs--improving air movement, much less wheezing CV- S1, S2 normal, regular  Abd-  +ve B.Sounds, Abd Soft, No tenderness,    Extremity/Skin:- +ve  edema especially of the upper  extremities, pedal pulses present  Psych-affect is appropriate, oriented x3 Neuro-generalized weakness no new focal deficits, no tremors  Data Reviewed: I have personally reviewed following labs and imaging studies  CBC: Recent Labs  Lab 11/22/21 2030 11/23/21 0555  WBC 5.1 4.1  NEUTROABS 3.3  --   HGB 12.3* 13.2  HCT 37.8* 41.1  MCV 80.4 81.4  PLT 320 732   Basic Metabolic Panel: Recent Labs  Lab 11/22/21 2030 11/23/21 0555 11/24/21 0541 11/25/21 0429  NA 139 138 137 135  K 3.1* 3.6 3.1* 3.4*  CL 99 97* 96* 97*  CO2 '30 31 31 30  '$ GLUCOSE 138* 265* 270* 325*  BUN 25* 24* 34* 43*  CREATININE 1.79* 1.67* 1.72* 1.65*  CALCIUM 8.6* 8.3* 9.2 9.4  MG  --  2.3  --   --   PHOS  --  3.7 3.3  --    GFR: Estimated Creatinine Clearance: 39.9 mL/min (A) (by C-G formula based on SCr of 1.65 mg/dL (H)). Liver Function Tests: Recent Labs  Lab 11/22/21 2030 11/23/21 0555 11/24/21 0541  AST 14* 15  --   ALT 11 12  --   ALKPHOS 100 106  --   BILITOT 0.7 0.5  --   PROT 7.4 7.1  --   ALBUMIN 3.8 3.6 3.8   Cardiac Enzymes: No results for input(s): "CKTOTAL", "CKMB", "CKMBINDEX", "TROPONINI" in the last 168 hours. BNP (last 3 results) No results for input(s): "PROBNP" in the last 8760 hours. HbA1C: No results for input(s): "HGBA1C" in the last 72 hours. Sepsis Labs: '@LABRCNTIP'$ (procalcitonin:4,lacticidven:4) )No results found for this or any previous visit (from the past 240 hour(s)).    Radiology Studies: DG Chest 2 View  Result Date: 11/25/2021 CLINICAL DATA:  Shortness of breath.  History of asthma and COPD. EXAM: CHEST - 2 VIEW COMPARISON:  AP chest 11/22/2021; chest two views 09/27/2021; AP chest 05/31/2021 FINDINGS: Cardiac silhouette is again mildly enlarged. Moderate calcification is again seen within aortic arch. Mild bilateral mid and lower lung chronic interstitial thickening. No definite pleural effusion. No pneumothorax is seen. Mild multilevel degenerative disc  changes of the thoracic spine. IMPRESSION: Mild chronic bilateral interstitial thickening is favored to represent interstitial scarring given similarity to multiple prior comparison studies. Acute interstitial pulmonary edema can have a similar appearance. Electronically Signed   By: Yvonne Kendall M.D.   On: 11/25/2021 14:58   NM Pulmonary Perfusion  Result Date: 11/25/2021 CLINICAL DATA:  Shortness of breath and chest pain. EXAM: NUCLEAR MEDICINE PERFUSION LUNG SCAN TECHNIQUE: Perfusion images were obtained in multiple projections after intravenous injection of radiopharmaceutical. Ventilation scans intentionally deferred if perfusion scan and chest x-ray adequate for interpretation during COVID 19 epidemic. RADIOPHARMACEUTICALS:  4.07 mCi Tc-35mMAA IV COMPARISON:  Chest radiograph from 11/22/2021. FINDINGS: Heterogeneous distribution of the radiopharmaceutical is noted within both lungs which may be seen in the setting of chronic obstructive pulmonary disease. A large nonsegmental defect is identified within the left upper lobe with rounded margins not typical for pulmonary embolus. No peripheral, segmental perfusion defects identified to suggest acute pulmonary embolus. IMPRESSION: 1. No findings to suggest acute pulmonary embolus. If there is a high clinical suspicion for acute pulmonary embolus then further evaluation with CTA of the chest and/or lower extremity Dopplers is recommended. Electronically Signed   By: TKerby MoorsM.D.   On: 11/25/2021 14:27     Scheduled Meds:  amiodarone  200 mg Oral Daily   amLODipine  5 mg Oral Daily   apixaban  5 mg Oral BID   aspirin EC  81 mg Oral Daily   budesonide  0.5 mg Nebulization BID   dextromethorphan-guaiFENesin  1 tablet Oral BID   doxycycline  100 mg Oral Q12H   [START ON 11/26/2021] furosemide  20 mg Intravenous Q12H   insulin aspart  0-15 Units Subcutaneous TID WC   insulin aspart  0-5 Units Subcutaneous QHS   insulin aspart  3 Units  Subcutaneous TID WC   ipratropium-albuterol  3 mL Nebulization TID   methylPREDNISolone (SOLU-MEDROL) injection  40 mg Intravenous Q12H   pantoprazole  40 mg Oral Daily   rosuvastatin  20 mg Oral Daily   Continuous Infusions:   LOS: 3 days   CRoxan HockeyM.D on 11/25/2021 at 6:54 PM  Go  to [email or web address] - for contact info  [facility] - Office  [phone]  If 7PM-7AM, please contact night-coverage [email or web address] 11/25/2021, 6:54 PM

## 2021-11-25 NOTE — Plan of Care (Signed)

## 2021-11-25 NOTE — Progress Notes (Signed)
   11/25/21 1618  ReDS Vest / Clip  Station Marker C  Ruler Value 31  ReDS Value Range < 36  ReDS Actual Value 17

## 2021-11-25 NOTE — TOC Initial Note (Signed)
Transition of Care [facility]) - Initial/Assessment Note    Patient Details  Name: Ronald [name] MRN: [id number] Date of Birth: 1945-11-30  Transition of [facility]) CM/SW Contact:    Boneta Lucks, RN Phone Number: 11/25/2021, 1:36 PM  Clinical Narrative:       Patient admitted with Congestive Heart Failure. Patient has a high risk for readmission. Patient lives at home with is [name]           ( Ex-Wife). He has a walker to use if needed. He had home health in the past for CHF education.  TOC spoke with his niece while he was out of the room having more test. She will ask him to see if he wants [facility] or PT. TOC to follow.          Expected Discharge Plan: [place] Barriers to Discharge: Continued Medical Work up   Patient Goals and CMS Choice Patient states their goals for this hospitalization and ongoing recovery are:: to go home CMS Medicare.gov Compare Post Acute Care list provided to:: Patient Choice offered to / list presented to : Patient  Expected Discharge Plan and Services Expected Discharge Plan: [place]      Living arrangements for the past 2 months: Single Family Home       Prior Living Arrangements/Services Living arrangements for the past 2 months: Single Family Home Lives with:: Relatives          Activities of Daily Living Home Assistive Devices/Equipment: None ADL Screening (condition at time of admission) Patient's cognitive ability adequate to safely complete daily activities?: Yes Is the patient deaf or have difficulty hearing?: Yes Does the patient have difficulty seeing, even when wearing glasses/contacts?: Yes Does the patient have difficulty concentrating, remembering, or making decisions?: No Patient able to express need for assistance with ADLs?: Yes Does the patient have difficulty dressing or bathing?: No Independently performs ADLs?: Yes (appropriate for developmental age) Does the patient have difficulty  walking or climbing stairs?: No Weakness of Legs: None Weakness of Arms/Hands: None  Permission Sought/Granted     Emotional Assessment       Orientation: : Oriented to Self, Oriented to Place, Oriented to Situation, Oriented to  Time Alcohol / Substance Use: Not Applicable Psych Involvement: No (comment)  Admission diagnosis:  COPD exacerbation (HCC) [J44.1] Acute exacerbation of CHF (congestive heart failure) (HCC) [I50.9] Acute on chronic diastolic CHF (congestive heart failure) (HCC) [D17.61] Systolic congestive heart failure, unspecified HF chronicity (HCC) [I50.20] Patient Active Problem List   Diagnosis Date Noted   Prolonged QT interval 11/23/2021   Acute exacerbation of CHF (congestive heart failure) ([place]) 11/23/2021   Acute on chronic diastolic CHF (congestive heart failure) ([place]) 11/22/2021   Non-ST elevation (NSTEMI) myocardial infarction ([place]) 09/01/2021   Respiratory failure ([place]) 08/31/2021   Acute respiratory failure ([place]) 07/10/2021   Sepsis due to pneumonia ([place]) 07/10/2021   Pulmonary nodule 07/10/2021   Malnutrition of moderate degree 05/13/2021   Atrial flutter ([place]) 05/01/2021   Shock circulatory ([place]) 05/01/2021   Acute hypercapnic respiratory failure ([place]) 05/01/2021   Elevated brain natriuretic peptide (BNP) level 12/21/2020   Elevated troponin 12/21/2020   Hypokalemia 12/21/2020   Hyperglycemia due to diabetes mellitus ([place]) 12/21/2020   Mixed hyperlipidemia 12/21/2020   Acute on chronic respiratory failure with hypoxia ([place]) 12/21/2020   Chest pain 05/23/2020   Controlled substance agreement signed 04/03/2020   Benzodiazepine dependence ([place]) 04/03/2020   Dysphagia 02/27/2020  Tracheal stenosis 12/09/2019   Lymphedema 04/01/2019   CHF (congestive heart failure) ([place]) 08/27/2018   Aortic atherosclerosis ([place]) 05/11/2017   Depression 05/05/2017   GERD (gastroesophageal reflux disease) 01/16/2017   Diabetic neuropathy ([place]) 01/16/2017    Anterolisthesis 12/04/2016   History of MI (myocardial infarction) 12/04/2016   OSA (obstructive sleep apnea) 12/04/2016   Closed fracture dislocation of lumbar spine ([place]) 07/25/2016   Closed fracture of body of sternum 07/25/2016   Multiple fractures of cervical spine ([place]) 07/25/2016   Anxiety 02/14/2016   Chronic respiratory failure with hypoxia ([place]) 07/19/2014   Essential hypertension 07/19/2014   Cigarette smoker 07/19/2014   Coronary artery disease 06/22/2014   Diabetes mellitus ([place]) 06/22/2014   COPD exacerbation ([place]) 06/22/2014   Tobacco abuse 06/22/2014   Acute encephalopathy 06/22/2014   Diabetes mellitus type 2 in obese ([place]) 05/11/2014   Simple chronic bronchitis ([place]) 05/11/2014   PCP:  [name], FNP Pharmacy:   [facility] [address] [place] - [address] [place] [address] Phone: [phone] Fax: [phone]  Readmission Risk Interventions    11/25/2021    1:34 PM 09/04/2021    1:32 PM 09/28/2020    8:28 AM  Readmission Risk Prevention Plan  Transportation Screening Complete Complete Complete  HRI or Home Care Consult   Complete  Social Work Consult for [place] Planning/Counseling   Complete  Palliative Care Screening   Not Applicable  Medication Review Press photographer) Complete Complete Complete  PCP or Specialist appointment within 3-5 days of discharge Not Complete Complete   HRI or Home Care Consult Complete Complete   SW Recovery Care/Counseling Consult Complete Complete   Palliative Care Screening Not Applicable Not Applicable   Comments  N/A   [place] Not Applicable Not Applicable

## 2021-11-26 DIAGNOSIS — I5033 Acute on chronic diastolic (congestive) heart failure: Secondary | ICD-10-CM | POA: Diagnosis not present

## 2021-11-26 LAB — BASIC METABOLIC PANEL
Anion gap: 9 (ref 5–15)
BUN: 58 mg/dL — ABNORMAL HIGH (ref 8–23)
CO2: 30 mmol/L (ref 22–32)
Calcium: 9.6 mg/dL (ref 8.9–10.3)
Chloride: 95 mmol/L — ABNORMAL LOW (ref 98–111)
Creatinine, Ser: 1.97 mg/dL — ABNORMAL HIGH (ref 0.61–1.24)
GFR, Estimated: 35 mL/min — ABNORMAL LOW (ref >60)
Glucose, Bld: 261 mg/dL — ABNORMAL HIGH (ref 70–99)
Potassium: 4.2 mmol/L (ref 3.5–5.1)
Sodium: 134 mmol/L — ABNORMAL LOW (ref 135–145)

## 2021-11-26 LAB — GLUCOSE, CAPILLARY
Glucose-Capillary: 272 mg/dL — ABNORMAL HIGH (ref 70–99)
Glucose-Capillary: 196 mg/dL — ABNORMAL HIGH (ref 70–99)

## 2021-11-26 MED ORDER — PREDNISONE 20 MG PO TABS: 40.0000 mg | ORAL_TABLET | Freq: Every day | ORAL | 0 refills | Status: AC

## 2021-11-26 MED ORDER — BISOPROLOL FUMARATE 5 MG PO TABS: 5.0000 mg | ORAL_TABLET | Freq: Every day | ORAL | 3 refills | Status: DC

## 2021-11-26 MED ORDER — ROSUVASTATIN CALCIUM 20 MG PO TABS: 20.0000 mg | ORAL_TABLET | Freq: Every day | ORAL | 2 refills | Status: DC

## 2021-11-26 MED ORDER — FUROSEMIDE 40 MG PO TABS: 40.0000 mg | ORAL_TABLET | Freq: Every day | ORAL | 2 refills | Status: DC

## 2021-11-26 MED ORDER — OMEPRAZOLE 20 MG PO CPDR: 20.0000 mg | DELAYED_RELEASE_CAPSULE | Freq: Every day | ORAL | 3 refills | Status: DC

## 2021-11-26 MED ORDER — NICOTINE 21 MG/24HR TD PT24: 21.0000 mg | MEDICATED_PATCH | TRANSDERMAL | 0 refills | Status: DC

## 2021-11-26 MED ORDER — ALBUTEROL SULFATE HFA 108 (90 BASE) MCG/ACT IN AERS: 2.0000 | INHALATION_SPRAY | Freq: Four times a day (QID) | RESPIRATORY_TRACT | 2 refills | Status: DC | PRN

## 2021-11-26 MED ORDER — IPRATROPIUM-ALBUTEROL 0.5-2.5 (3) MG/3ML IN SOLN: 3.0000 mL | Freq: Four times a day (QID) | RESPIRATORY_TRACT | 3 refills | Status: DC | PRN

## 2021-11-26 NOTE — Discharge Summary (Incomplete)
Ronald [name], is a 76 y.o. male  DOB [birth year]  MRN [id number].  Admission date:  11/22/2021  Admitting Physician  Jahmeir Geisen Denton Brick, MD  Discharge Date:  11/26/2021   Primary MD  [name], FNP  Recommendations for primary care physician for things to follow:   1)Avoid ibuprofen/Advil/Aleve/Motrin/Goody Powders/Naproxen/BC powders/Meloxicam/Diclofenac/Indomethacin and other Nonsteroidal anti-inflammatory medications as these will make you more likely to bleed and can cause stomach ulcers, can also cause Kidney problems.   2)Stop Smoking  3)Take Medications as prescribed  4)Very low-salt diet advised 5)Weigh yourself daily, call if you gain more than 3 pounds in 1 day or more than 5 pounds in 1 week as your diuretic medications may need to be adjusted 6)You need oxygen at home at 2 to 3L via nasal cannula continuously while awake and while asleep--- smoking or having open fires around oxygen can cause fire, significant injury and death  Admission Diagnosis  COPD exacerbation ([place]) [J44.1] Acute exacerbation of CHF (congestive heart failure) ([place]) [I50.9] Acute on chronic diastolic CHF (congestive heart failure) (HCC) [C76.39] Systolic congestive heart failure, unspecified HF chronicity (HCC) [I50.20]   Discharge Diagnosis  COPD exacerbation ([place]) [J44.1] Acute exacerbation of CHF (congestive heart failure) ([place]) [I50.9] Acute on chronic diastolic CHF (congestive heart failure) (HCC) [E32.00] Systolic congestive heart failure, unspecified HF chronicity ([place]) [I50.20]  ***  Principal Problem:   Acute on chronic diastolic CHF (congestive heart failure) (HCC) Active Problems:   Coronary artery disease   COPD exacerbation (HCC)   Essential hypertension   GERD (gastroesophageal reflux disease)   Elevated brain natriuretic peptide (BNP) level   Elevated troponin   Hypokalemia   Mixed  hyperlipidemia   Acute on chronic respiratory failure with hypoxia (HCC)   Prolonged QT interval   Acute exacerbation of CHF (congestive heart failure) ([place])      Past Medical History:  Diagnosis Date   Anxiety    Asthma    Atrial flutter ([place]) 04/2021   Chronic diastolic CHF (congestive heart failure) (HCC)    Chronic lower back pain    COPD (chronic obstructive pulmonary disease) (HCC)    Coronary artery disease    a. NSTEMI 05/2014 s/p DESx2 to LAD at [facility].   Depression    GERD (gastroesophageal reflux disease)    High cholesterol    History of tracheostomy    Hypertension    NSTEMI (non-ST elevated myocardial infarction) ([place]) 05/2014   with stent placement   PAF (paroxysmal atrial fibrillation) ([place])    Sleep apnea    Stroke ([place]) 2017   anyeusym    TIA (transient ischemic attack)    "they say I've had some mini strokes; don't know when"; denies residual on 06/22/2014)   Tobacco abuse    Type II diabetes mellitus ([place])    Ulcerative colitis ([place])     Past Surgical History:  Procedure Laterality Date   APPENDECTOMY     BIOPSY  07/20/2020   Procedure: BIOPSY;  Surgeon: Harvel Quale, MD;  Location: [facility]  ENDO SU[facility];  Service: Gastroenterology;;   CARDIAC CATHETERIZATION  [phone] X 3   CHOLECYSTECTOMY     COLONOSCOPY WITH PROPOFOL N/A 07/20/2020   Procedure: COLONOSCOPY WITH PROPOFOL;  Surgeon: Harvel Quale, MD;  Location: [facility];  Service: Gastroenterology;  Laterality: N/A;  1:15   CORONARY ANGIOPLASTY WITH STENT PLACEMENT  05/2014   "2"   ESOPHAGEAL DILATION N/A 07/20/2020   Procedure: ESOPHAGEAL DILATION;  Surgeon: Harvel Quale, MD;  Location: [facility];  Service: Gastroenterology;  Laterality: N/A;   ESOPHAGOGASTRODUODENOSCOPY (EGD) WITH PROPOFOL N/A 07/20/2020   Procedure: ESOPHAGOGASTRODUODENOSCOPY (EGD) WITH PROPOFOL;  Surgeon: Harvel Quale, MD;  Location[facility];  Service: Gastroenterology;   Laterality: N/A;   IR GASTROSTOMY TUBE MOD SED  06/04/2021   IR GASTROSTOMY TUBE REMOVAL  07/24/2021   LEFT HEART CATH AND CORONARY ANGIOGRAPHY N/A 05/25/2020   Procedure: LEFT HEART CATH AND CORONARY ANGIOGRAPHY;  Surgeon: Martinique, Peter M, MD;  Location: [place] [facility] LAB;  Service: Cardiovascular;  Laterality: N/A;   POLYPECTOMY  07/20/2020   Procedure: POLYPECTOMY INTESTINAL;  Surgeon: Harvel Quale, MD;  Location: [facility];  Service: Gastroenterology;;   TUMOR EXCISION Right ~ 1999   "side of my upper head"       HPI  from the history and physical done on the day of admission:     ***  ****     Hospital Course:     No notes on file  ***** Assessment and Plan: No notes have been filed under this hospital service. Service: Hospitalist       Discharge Condition: ***  Follow UP   Follow-up Information     [place] Follow up.   Why: RN will call to schedule your home visit.                 Consults obtained - ***  Diet and Activity recommendation:  As advised  Discharge Instructions    **** Discharge Instructions     Call MD for:  difficulty breathing, headache or visual disturbances   Complete by: As directed    Call MD for:  persistant dizziness or light-headedness   Complete by: As directed    Call MD for:  persistant nausea and vomiting   Complete by: As directed    Call MD for:  temperature >100.4   Complete by: As directed    Diet - low sodium heart healthy   Complete by: As directed    Discharge instructions   Complete by: As directed    1)Avoid ibuprofen/Advil/Aleve/Motrin/Goody Powders/Naproxen/BC powders/Meloxicam/Diclofenac/Indomethacin and other Nonsteroidal anti-inflammatory medications as these will make you more likely to bleed and can cause stomach ulcers, can also cause Kidney problems.   2)Stop Smoking  3)Take Medications as prescribed  4)Very low-salt diet advised 5)Weigh yourself daily,  call if you gain more than 3 pounds in 1 day or more than 5 pounds in 1 week as your diuretic medications may need to be adjusted 6)You need oxygen at home at 2 to 3L via nasal cannula continuously while awake and while asleep--- smoking or having open fires around oxygen can cause fire, significant injury and death   Increase activity slowly   Complete by: As directed          Discharge Medications     Allergies as of 11/26/2021       Reactions   Gabapentin Anxiety   Unknown reaction   Metformin And Related Rash  Medication List     STOP taking these medications    metoprolol tartrate 25 MG tablet Commonly known as: LOPRESSOR   potassium chloride 20 MEQ packet Commonly known as: KLOR-CON       TAKE these medications    Accu-Chek Guide Me w/Device Kit Check BS daily Dx E11.9   albuterol 108 (90 Base) MCG/ACT inhaler Commonly known as: VENTOLIN HFA Inhale 2 puffs into the lungs every 6 (six) hours as needed for wheezing or shortness of breath.   ALPRAZolam 0.5 MG tablet Commonly known as: XANAX Take 1 tablet (0.5 mg total) by mouth 2 (two) times daily as needed. for anxiety   amiodarone 200 MG tablet Commonly known as: PACERONE Take 1 tablet by mouth once daily   amLODipine 5 MG tablet Commonly known as: NORVASC Take 1 tablet (5 mg total) by mouth daily.   aspirin EC 81 MG tablet Take 1 tablet (81 mg total) by mouth daily. Swallow whole.   bisoprolol 5 MG tablet Commonly known as: ZEBETA Take 1 tablet (5 mg total) by mouth daily. In Place of Metoprolol   blood glucose meter kit and supplies Dispense based on patient and insurance preference. Use up to four times daily as directed. (FOR ICD-10 E10.9, E11.9).   budesonide 0.5 MG/2ML nebulizer solution Commonly known as: PULMICORT Take 2 mLs (0.5 mg total) by nebulization 2 (two) times daily.   Eliquis 5 MG Tabs tablet Generic drug: apixaban Take 1 tablet (5 mg total) by mouth 2 (two) times  daily.   furosemide 40 MG tablet Commonly known as: Lasix Take 1 tablet (40 mg total) by mouth daily.   glucose blood test strip Commonly known as: ONE TOUCH ULTRA TEST USE TO CHECK GLUCOSE ONCE DAILY   ipratropium-albuterol 0.5-2.5 (3) MG/3ML Soln Commonly known as: DUONEB Take 3 mLs by nebulization every 6 (six) hours as needed (asthma).   isosorbide mononitrate 10 MG tablet Commonly known as: ISMO Take 1 tablet by mouth twice daily   losartan 100 MG tablet Commonly known as: COZAAR Take 1 tablet (100 mg total) by mouth daily.   nicotine 21 mg/24hr patch Commonly known as: NICODERM CQ - dosed in mg/24 hours Place 1 patch (21 mg total) onto the skin daily for 28 days.   nitroGLYCERIN 0.4 MG SL tablet Commonly known as: NITROSTAT Place 1 tablet (0.4 mg total) under the tongue every 5 (five) minutes as needed for chest pain.   omeprazole 20 MG capsule Commonly known as: PRILOSEC Take 1 capsule (20 mg total) by mouth daily.   OXYGEN Inhale 3 L into the lungs at bedtime as needed (shortness of breath).   predniSONE 20 MG tablet Commonly known as: DELTASONE Take 2 tablets (40 mg total) by mouth daily with breakfast for 5 days.   rosuvastatin 20 MG tablet Commonly known as: CRESTOR Take 1 tablet (20 mg total) by mouth daily.        Major procedures and Radiology Reports - PLEASE review detailed and final reports for all details, in brief -   ***  DG Chest 2 View  Result Date: 11/25/2021 CLINICAL DATA:  Shortness of breath.  History of asthma and COPD. EXAM: CHEST - 2 VIEW COMPARISON:  [facility] chest 11/22/2021; chest two views 09/27/2021; [facility] chest 05/31/2021 FINDINGS: Cardiac silhouette is again mildly enlarged. Moderate calcification is again seen within aortic arch. Mild bilateral mid and lower lung chronic interstitial thickening. No definite pleural effusion. No pneumothorax is seen. Mild multilevel degenerative disc changes of  the thoracic spine. IMPRESSION: Mild  chronic bilateral interstitial thickening is favored to represent interstitial scarring given similarity to multiple prior comparison studies. Acute interstitial pulmonary edema can have a similar appearance. Electronically Signed   By: Yvonne Kendall M.D.   On: 11/25/2021 14:58   NM Pulmonary Perfusion  Result Date: 11/25/2021 CLINICAL DATA:  Shortness of breath and chest pain. EXAM: NUCLEAR MEDICINE PERFUSION LUNG SCAN TECHNIQUE: Perfusion images were obtained in multiple projections after intravenous injection of radiopharmaceutical. Ventilation scans intentionally deferred if perfusion scan and chest x-ray adequate for interpretation during COVID 19 epidemic. RADIOPHARMACEUTICALS:  4.07 mCi Tc-30mMAA IV COMPARISON:  Chest radiograph from 11/22/2021. FINDINGS: Heterogeneous distribution of the radiopharmaceutical is noted within both lungs which may be seen in the setting of chronic obstructive pulmonary disease. A large nonsegmental defect is identified within the left upper lobe with rounded margins not typical for pulmonary embolus. No peripheral, segmental perfusion defects identified to suggest acute pulmonary embolus. IMPRESSION: 1. No findings to suggest acute pulmonary embolus. If there is a high clinical suspicion for acute pulmonary embolus then further evaluation with CTA of the chest and/or lower extremity Dopplers is recommended. Electronically Signed   By: TKerby MoorsM.D.   On: 11/25/2021 14:27   DG Chest Port 1 View  Result Date: 11/22/2021 CLINICAL DATA:  Shortness of breath beginning 2 days ago. EXAM: PORTABLE CHEST 1 VIEW COMPARISON:  Two-view chest x-ray 09/27/2021 FINDINGS: Heart is mildly enlarged. Atherosclerotic calcifications are again seen at the aortic arch. Mild edema is present. Significant effusion. No airspace consolidation. Mild degenerative changes are again seen in the thoracic spine. IMPRESSION: Mild cardiomegaly and mild edema, compatible with congestive heart  failure. Electronically Signed   By: CSan MorelleM.D.   On: 11/22/2021 21:03    Micro Results   *** No results found for this or any previous visit (from the past 240 hour(s)).  Today   Subjective    FBruna Pottertoday has no ***          Patient has been seen and examined prior to discharge   Objective   Blood pressure (!) 138/54, pulse 94, temperature 98.4 F (36.9 C), temperature source Oral, resp. rate 20, height 5' 9"  (1.753 m), weight 81.6 kg, SpO2 94 %.   Intake/Output Summary (Last 24 hours) at 11/26/2021 1204 Last data filed at 11/26/2021 1141 Gross per 24 hour  Intake 1440 ml  Output 1125 ml  Net 315 ml    Exam Gen:- Awake Alert, no acute distress *** HEENT:- [place].AT, No sclera icterus Neck-Supple Neck,No JVD,.  Lungs-  CTAB , good air movement bilaterally CV- S1, S2 normal, regular Abd-  +ve B.Sounds, Abd Soft, No tenderness,    Extremity/Skin:- No  edema,   good pulses Psych-affect is appropriate, oriented x3 Neuro-no new focal deficits, no tremors ***   Data Review   CBC w Diff:  Lab Results  Component Value Date   WBC 4.1 11/23/2021   HGB 13.2 11/23/2021   HGB 11.9 (L) 09/27/2021   HCT 41.1 11/23/2021   HCT 36.7 (L) 09/27/2021   PLT 310 11/23/2021   PLT 393 09/27/2021   LYMPHOPCT 16 11/22/2021   MONOPCT 16 11/22/2021   EOSPCT 2 11/22/2021   BASOPCT 1 11/22/2021    CMP:  Lab Results  Component Value Date   NA 134 (L) 11/26/2021   NA 144 09/27/2021   K 4.2 11/26/2021   CL 95 (L) 11/26/2021   CO2 30 11/26/2021  BUN 58 (H) 11/26/2021   BUN 12 09/27/2021   CREATININE 1.97 (H) 11/26/2021   PROT 7.1 11/23/2021   PROT 6.4 09/27/2021   ALBUMIN 3.8 11/24/2021   ALBUMIN 4.1 09/27/2021   BILITOT 0.5 11/23/2021   BILITOT 0.4 09/27/2021   ALKPHOS 106 11/23/2021   AST 15 11/23/2021   ALT 12 11/23/2021  .  Total Discharge time is about 33 minutes  Roxan Hockey M.D on 11/26/2021 at 12:04 PM  Go to [email or web address] -  for contact  info  [facility] - Office  [phone]

## 2021-11-26 NOTE — Progress Notes (Signed)
Patient discharged home today, transported home by son. Discharge paperwork went over with patient and son, both verbalized understanding. Belongings sent home with patient.

## 2021-11-26 NOTE — Discharge Instructions (Addendum)
1)Avoid ibuprofen/Advil/Aleve/Motrin/Goody Powders/Naproxen/BC powders/Meloxicam/Diclofenac/Indomethacin and other Nonsteroidal anti-inflammatory medications as these will make you more likely to bleed and can cause stomach ulcers, can also cause Kidney problems.   2)Stop Smoking  3)Take Medications as prescribed  4)Very low-salt diet advised 5)Weigh yourself daily, call if you gain more than 3 pounds in 1 day or more than 5 pounds in 1 week as your diuretic medications may need to be adjusted 6)You need oxygen at home at 2 to 3L via nasal cannula continuously while awake and while asleep--- smoking or having open fires around oxygen can cause fire, significant injury and death

## 2021-11-26 NOTE — TOC Transition Note (Signed)
Transition of Care [facility]) - CM/SW Discharge Note   Patient Details  Name: Ronald [name] MRN: [id number] Date of Birth: 1945-06-20  [facility] CM/SW Contact:  Boneta Lucks, RN Phone Number: 11/26/2021, 11:53 AM   Clinical Narrative:   Patient discharging home. MD order [facility] for CHF education. Referral sent to Sarah with [facility], added to AVS.   Final next level of care: [place] Barriers to Discharge: Barriers Resolved   Patient Goals and CMS Choice Patient states their goals for this hospitalization and ongoing recovery are:: to go home CMS Medicare.gov Compare Post Acute Care list provided to:: Patient Choice offered to / list presented to : Patient  Discharge Placement    Patient and family notified of of transfer: 11/26/21  Discharge Plan and Services     Readmission Risk Interventions    11/25/2021    1:34 PM 09/04/2021    1:32 PM 09/28/2020    8:28 AM  Readmission Risk Prevention Plan  Transportation Screening Complete Complete Complete  HRI or Home Care Consult   Complete  Social Work Consult for [place] Planning/Counseling   Complete  Palliative Care Screening   Not Applicable  Medication Review Press photographer) Complete Complete Complete  PCP or Specialist appointment within 3-5 days of discharge Not Complete Complete   HRI or Home Care Consult Complete Complete   SW Recovery Care/Counseling Consult Complete Complete   Palliative Care Screening Not Applicable Not Applicable   Comments  [place] Not Applicable Not Applicable

## 2021-11-26 NOTE — Inpatient Diabetes Management (Signed)
Inpatient Diabetes Program Recommendations  AACE/ADA: New Consensus Statement on Inpatient Glycemic Control   Target Ranges:  Prepandial:   less than 140 mg/dL      Peak postprandial:   less than 180 mg/dL (1-2 hours)      Critically ill patients:  140 - 180 mg/dL    Latest Reference Range & Units 11/25/21 07:59 11/25/21 10:16 11/25/21 11:26 11/25/21 17:03 11/25/21 21:04 11/26/21 07:27  Glucose-Capillary 70 - 99 mg/dL 302 (H)  Novolog 14 units     Semglee 12 units  158 (H)  Novolog 3 units 323 (H)  Novolog 14 units 127 (H) 272 (H)  Novolog 11 units   Review of Glycemic Control  Diabetes history: DM2 Outpatient Diabetes medications: None Current orders for Inpatient glycemic control: Novolog 0-15 units TID with meals, Novolog 0-5 units QHS, Novolog 3 units TID with meals; Solumedrol 40 mg Q12H  Inpatient Diabetes Program Recommendations:    Insulin: Noted patient received one time Semglee 12 units on 11/25/21.  If steroids are continued as ordered, please consider ordering Semglee 15 units daily and increasing meal coverage to Novolog 5 units TID with meals.   Thanks, Barnie Alderman, RN, MSN, [place] Diabetes Coordinator Inpatient Diabetes Program [phone] (Team Pager from 8am to [place])

## 2021-11-26 NOTE — Care Management Important Message (Signed)
Important Message  Patient Details  Name: Ronald [name] MRN: [id number] Date of Birth: August 03, 1945   Medicare Important Message Given:  Yes (spoke with  [name] at [phone], reviewed letter, no concerns, no additional copy needed)     Tommy Medal 11/26/2021, 11:46 AM

## 2021-11-27 ENCOUNTER — Telehealth: Payer: Self-pay

## 2021-11-27 DIAGNOSIS — F17210 Nicotine dependence, cigarettes, uncomplicated: Secondary | ICD-10-CM | POA: Diagnosis not present

## 2021-11-27 DIAGNOSIS — E1122 Type 2 diabetes mellitus with diabetic chronic kidney disease: Secondary | ICD-10-CM | POA: Diagnosis not present

## 2021-11-27 DIAGNOSIS — I4892 Unspecified atrial flutter: Secondary | ICD-10-CM | POA: Diagnosis not present

## 2021-11-27 DIAGNOSIS — I700 Atherosclerosis of aorta: Secondary | ICD-10-CM | POA: Diagnosis not present

## 2021-11-27 DIAGNOSIS — I2510 Atherosclerotic heart disease of native coronary artery without angina pectoris: Secondary | ICD-10-CM | POA: Diagnosis not present

## 2021-11-27 DIAGNOSIS — G9340 Encephalopathy, unspecified: Secondary | ICD-10-CM | POA: Diagnosis not present

## 2021-11-27 DIAGNOSIS — Z9981 Dependence on supplemental oxygen: Secondary | ICD-10-CM | POA: Diagnosis not present

## 2021-11-27 DIAGNOSIS — E876 Hypokalemia: Secondary | ICD-10-CM | POA: Diagnosis not present

## 2021-11-27 DIAGNOSIS — Z9181 History of falling: Secondary | ICD-10-CM | POA: Diagnosis not present

## 2021-11-27 DIAGNOSIS — I130 Hypertensive heart and chronic kidney disease with heart failure and stage 1 through stage 4 chronic kidney disease, or unspecified chronic kidney disease: Secondary | ICD-10-CM | POA: Diagnosis not present

## 2021-11-27 DIAGNOSIS — F32A Depression, unspecified: Secondary | ICD-10-CM | POA: Diagnosis not present

## 2021-11-27 DIAGNOSIS — I5033 Acute on chronic diastolic (congestive) heart failure: Secondary | ICD-10-CM | POA: Diagnosis not present

## 2021-11-27 DIAGNOSIS — J9602 Acute respiratory failure with hypercapnia: Secondary | ICD-10-CM | POA: Diagnosis not present

## 2021-11-27 DIAGNOSIS — E1140 Type 2 diabetes mellitus with diabetic neuropathy, unspecified: Secondary | ICD-10-CM | POA: Diagnosis not present

## 2021-11-27 DIAGNOSIS — F1320 Sedative, hypnotic or anxiolytic dependence, uncomplicated: Secondary | ICD-10-CM | POA: Diagnosis not present

## 2021-11-27 DIAGNOSIS — N179 Acute kidney failure, unspecified: Secondary | ICD-10-CM | POA: Diagnosis not present

## 2021-11-27 DIAGNOSIS — I890 Lymphedema, not elsewhere classified: Secondary | ICD-10-CM | POA: Diagnosis not present

## 2021-11-27 DIAGNOSIS — Z7982 Long term (current) use of aspirin: Secondary | ICD-10-CM | POA: Diagnosis not present

## 2021-11-27 DIAGNOSIS — Z7901 Long term (current) use of anticoagulants: Secondary | ICD-10-CM | POA: Diagnosis not present

## 2021-11-27 DIAGNOSIS — E440 Moderate protein-calorie malnutrition: Secondary | ICD-10-CM | POA: Diagnosis not present

## 2021-11-27 DIAGNOSIS — J441 Chronic obstructive pulmonary disease with (acute) exacerbation: Secondary | ICD-10-CM | POA: Diagnosis not present

## 2021-11-27 DIAGNOSIS — N1831 Chronic kidney disease, stage 3a: Secondary | ICD-10-CM | POA: Diagnosis not present

## 2021-11-27 DIAGNOSIS — J9621 Acute and chronic respiratory failure with hypoxia: Secondary | ICD-10-CM | POA: Diagnosis not present

## 2021-11-27 DIAGNOSIS — I252 Old myocardial infarction: Secondary | ICD-10-CM | POA: Diagnosis not present

## 2021-11-27 DIAGNOSIS — I110 Hypertensive heart disease with heart failure: Secondary | ICD-10-CM | POA: Diagnosis not present

## 2021-11-27 DIAGNOSIS — I480 Paroxysmal atrial fibrillation: Secondary | ICD-10-CM | POA: Diagnosis not present

## 2021-11-27 DIAGNOSIS — F419 Anxiety disorder, unspecified: Secondary | ICD-10-CM | POA: Diagnosis not present

## 2021-11-27 DIAGNOSIS — D649 Anemia, unspecified: Secondary | ICD-10-CM | POA: Diagnosis not present

## 2021-11-27 NOTE — Telephone Encounter (Signed)
Ronald [name] and is aware about the trellegy. She says that pt has been taking trellegy well over a yr. He has been getting samples because rx is $700. Please call back

## 2021-11-27 NOTE — Telephone Encounter (Signed)
Transition Care Management Unsuccessful Follow-up Telephone Call  Date of discharge and from where:  11/26/2021 - Ronald [name] - COPD exacerbation  Attempts:  1st Attempt  Reason for unsuccessful TCM follow-up call:  Voice mail full

## 2021-11-28 ENCOUNTER — Telehealth: Payer: Self-pay

## 2021-11-28 NOTE — Telephone Encounter (Signed)
Pt wants to speak with nurse about trelegy samples

## 2021-11-28 NOTE — Telephone Encounter (Signed)
Tried calling pt.  No answer and vmail is full.  We do have samples of Trelegy 157mg.  Need to make pt aware.

## 2021-11-28 NOTE — Telephone Encounter (Signed)
Transition Care Management Unsuccessful Follow-up Telephone Call  Date of discharge and from where:  11/26/2021 - [facility] - COPD exacerbation  Attempts:  2nd Attempt  Reason for unsuccessful TCM follow-up call:  Voice mail full

## 2021-11-28 NOTE — Telephone Encounter (Signed)
Pt returned call. He does use the 171mg of Trelegy. Samples left up front

## 2021-12-02 DIAGNOSIS — I5030 Unspecified diastolic (congestive) heart failure: Secondary | ICD-10-CM | POA: Diagnosis not present

## 2021-12-03 DIAGNOSIS — F32A Depression, unspecified: Secondary | ICD-10-CM | POA: Diagnosis not present

## 2021-12-03 DIAGNOSIS — E440 Moderate protein-calorie malnutrition: Secondary | ICD-10-CM | POA: Diagnosis not present

## 2021-12-03 DIAGNOSIS — J441 Chronic obstructive pulmonary disease with (acute) exacerbation: Secondary | ICD-10-CM | POA: Diagnosis not present

## 2021-12-03 DIAGNOSIS — F1320 Sedative, hypnotic or anxiolytic dependence, uncomplicated: Secondary | ICD-10-CM | POA: Diagnosis not present

## 2021-12-03 DIAGNOSIS — Z9181 History of falling: Secondary | ICD-10-CM | POA: Diagnosis not present

## 2021-12-03 DIAGNOSIS — Z7901 Long term (current) use of anticoagulants: Secondary | ICD-10-CM | POA: Diagnosis not present

## 2021-12-03 DIAGNOSIS — F17210 Nicotine dependence, cigarettes, uncomplicated: Secondary | ICD-10-CM | POA: Diagnosis not present

## 2021-12-03 DIAGNOSIS — I110 Hypertensive heart disease with heart failure: Secondary | ICD-10-CM | POA: Diagnosis not present

## 2021-12-03 DIAGNOSIS — I890 Lymphedema, not elsewhere classified: Secondary | ICD-10-CM | POA: Diagnosis not present

## 2021-12-03 DIAGNOSIS — Z9981 Dependence on supplemental oxygen: Secondary | ICD-10-CM | POA: Diagnosis not present

## 2021-12-03 DIAGNOSIS — I4892 Unspecified atrial flutter: Secondary | ICD-10-CM | POA: Diagnosis not present

## 2021-12-03 DIAGNOSIS — D649 Anemia, unspecified: Secondary | ICD-10-CM | POA: Diagnosis not present

## 2021-12-03 DIAGNOSIS — E876 Hypokalemia: Secondary | ICD-10-CM | POA: Diagnosis not present

## 2021-12-03 DIAGNOSIS — I480 Paroxysmal atrial fibrillation: Secondary | ICD-10-CM | POA: Diagnosis not present

## 2021-12-03 DIAGNOSIS — I5033 Acute on chronic diastolic (congestive) heart failure: Secondary | ICD-10-CM | POA: Diagnosis not present

## 2021-12-03 DIAGNOSIS — I252 Old myocardial infarction: Secondary | ICD-10-CM | POA: Diagnosis not present

## 2021-12-03 DIAGNOSIS — J9621 Acute and chronic respiratory failure with hypoxia: Secondary | ICD-10-CM | POA: Diagnosis not present

## 2021-12-03 DIAGNOSIS — I700 Atherosclerosis of aorta: Secondary | ICD-10-CM | POA: Diagnosis not present

## 2021-12-03 DIAGNOSIS — I2510 Atherosclerotic heart disease of native coronary artery without angina pectoris: Secondary | ICD-10-CM | POA: Diagnosis not present

## 2021-12-03 DIAGNOSIS — J9602 Acute respiratory failure with hypercapnia: Secondary | ICD-10-CM | POA: Diagnosis not present

## 2021-12-03 DIAGNOSIS — F419 Anxiety disorder, unspecified: Secondary | ICD-10-CM | POA: Diagnosis not present

## 2021-12-03 DIAGNOSIS — Z7982 Long term (current) use of aspirin: Secondary | ICD-10-CM | POA: Diagnosis not present

## 2021-12-03 DIAGNOSIS — G9340 Encephalopathy, unspecified: Secondary | ICD-10-CM | POA: Diagnosis not present

## 2021-12-03 DIAGNOSIS — E1140 Type 2 diabetes mellitus with diabetic neuropathy, unspecified: Secondary | ICD-10-CM | POA: Diagnosis not present

## 2021-12-04 DIAGNOSIS — J439 Emphysema, unspecified: Secondary | ICD-10-CM | POA: Diagnosis not present

## 2021-12-05 DIAGNOSIS — D649 Anemia, unspecified: Secondary | ICD-10-CM | POA: Diagnosis not present

## 2021-12-05 DIAGNOSIS — E1140 Type 2 diabetes mellitus with diabetic neuropathy, unspecified: Secondary | ICD-10-CM | POA: Diagnosis not present

## 2021-12-05 DIAGNOSIS — I480 Paroxysmal atrial fibrillation: Secondary | ICD-10-CM | POA: Diagnosis not present

## 2021-12-05 DIAGNOSIS — I110 Hypertensive heart disease with heart failure: Secondary | ICD-10-CM | POA: Diagnosis not present

## 2021-12-05 DIAGNOSIS — J9621 Acute and chronic respiratory failure with hypoxia: Secondary | ICD-10-CM | POA: Diagnosis not present

## 2021-12-05 DIAGNOSIS — I890 Lymphedema, not elsewhere classified: Secondary | ICD-10-CM | POA: Diagnosis not present

## 2021-12-05 DIAGNOSIS — I700 Atherosclerosis of aorta: Secondary | ICD-10-CM | POA: Diagnosis not present

## 2021-12-05 DIAGNOSIS — E876 Hypokalemia: Secondary | ICD-10-CM | POA: Diagnosis not present

## 2021-12-05 DIAGNOSIS — Z7901 Long term (current) use of anticoagulants: Secondary | ICD-10-CM | POA: Diagnosis not present

## 2021-12-05 DIAGNOSIS — J441 Chronic obstructive pulmonary disease with (acute) exacerbation: Secondary | ICD-10-CM | POA: Diagnosis not present

## 2021-12-05 DIAGNOSIS — F17210 Nicotine dependence, cigarettes, uncomplicated: Secondary | ICD-10-CM | POA: Diagnosis not present

## 2021-12-05 DIAGNOSIS — I5033 Acute on chronic diastolic (congestive) heart failure: Secondary | ICD-10-CM | POA: Diagnosis not present

## 2021-12-05 DIAGNOSIS — I4892 Unspecified atrial flutter: Secondary | ICD-10-CM | POA: Diagnosis not present

## 2021-12-05 DIAGNOSIS — G9340 Encephalopathy, unspecified: Secondary | ICD-10-CM | POA: Diagnosis not present

## 2021-12-05 DIAGNOSIS — Z9181 History of falling: Secondary | ICD-10-CM | POA: Diagnosis not present

## 2021-12-05 DIAGNOSIS — F419 Anxiety disorder, unspecified: Secondary | ICD-10-CM | POA: Diagnosis not present

## 2021-12-05 DIAGNOSIS — Z7982 Long term (current) use of aspirin: Secondary | ICD-10-CM | POA: Diagnosis not present

## 2021-12-05 DIAGNOSIS — I2510 Atherosclerotic heart disease of native coronary artery without angina pectoris: Secondary | ICD-10-CM | POA: Diagnosis not present

## 2021-12-05 DIAGNOSIS — F1320 Sedative, hypnotic or anxiolytic dependence, uncomplicated: Secondary | ICD-10-CM | POA: Diagnosis not present

## 2021-12-05 DIAGNOSIS — I252 Old myocardial infarction: Secondary | ICD-10-CM | POA: Diagnosis not present

## 2021-12-05 DIAGNOSIS — E440 Moderate protein-calorie malnutrition: Secondary | ICD-10-CM | POA: Diagnosis not present

## 2021-12-05 DIAGNOSIS — J9602 Acute respiratory failure with hypercapnia: Secondary | ICD-10-CM | POA: Diagnosis not present

## 2021-12-05 DIAGNOSIS — F32A Depression, unspecified: Secondary | ICD-10-CM | POA: Diagnosis not present

## 2021-12-05 DIAGNOSIS — Z9981 Dependence on supplemental oxygen: Secondary | ICD-10-CM | POA: Diagnosis not present

## 2021-12-06 ENCOUNTER — Encounter (HOSPITAL_COMMUNITY): Payer: Self-pay

## 2021-12-06 ENCOUNTER — Ambulatory Visit: Payer: Medicare HMO | Admitting: Family

## 2021-12-06 ENCOUNTER — Inpatient Hospital Stay (HOSPITAL_COMMUNITY)
Admission: EM | Admit: 2021-12-06 | Discharge: 2021-12-10 | DRG: 291 | Disposition: A | Discharge status: Home Health | Payer: Medicare HMO | Attending: Family Medicine | Admitting: Family Medicine

## 2021-12-06 ENCOUNTER — Emergency Department (HOSPITAL_COMMUNITY): Payer: Medicare HMO

## 2021-12-06 ENCOUNTER — Other Ambulatory Visit: Payer: Self-pay

## 2021-12-06 DIAGNOSIS — F17210 Nicotine dependence, cigarettes, uncomplicated: Secondary | ICD-10-CM | POA: Diagnosis present

## 2021-12-06 DIAGNOSIS — Z91199 Patient's noncompliance with other medical treatment and regimen due to unspecified reason: Secondary | ICD-10-CM

## 2021-12-06 DIAGNOSIS — N179 Acute kidney failure, unspecified: Secondary | ICD-10-CM | POA: Diagnosis present

## 2021-12-06 DIAGNOSIS — I110 Hypertensive heart disease with heart failure: Principal | ICD-10-CM | POA: Diagnosis present

## 2021-12-06 DIAGNOSIS — Z825 Family history of asthma and other chronic lower respiratory diseases: Secondary | ICD-10-CM

## 2021-12-06 DIAGNOSIS — J9811 Atelectasis: Secondary | ICD-10-CM | POA: Diagnosis present

## 2021-12-06 DIAGNOSIS — Z9981 Dependence on supplemental oxygen: Secondary | ICD-10-CM

## 2021-12-06 DIAGNOSIS — I5033 Acute on chronic diastolic (congestive) heart failure: Secondary | ICD-10-CM | POA: Diagnosis not present

## 2021-12-06 DIAGNOSIS — Z888 Allergy status to other drugs, medicaments and biological substances status: Secondary | ICD-10-CM

## 2021-12-06 DIAGNOSIS — I252 Old myocardial infarction: Secondary | ICD-10-CM

## 2021-12-06 DIAGNOSIS — T501X5A Adverse effect of loop [high-ceiling] diuretics, initial encounter: Secondary | ICD-10-CM | POA: Diagnosis not present

## 2021-12-06 DIAGNOSIS — Z7901 Long term (current) use of anticoagulants: Secondary | ICD-10-CM

## 2021-12-06 DIAGNOSIS — J42 Unspecified chronic bronchitis: Secondary | ICD-10-CM

## 2021-12-06 DIAGNOSIS — Z8673 Personal history of transient ischemic attack (TIA), and cerebral infarction without residual deficits: Secondary | ICD-10-CM

## 2021-12-06 DIAGNOSIS — E119 Type 2 diabetes mellitus without complications: Secondary | ICD-10-CM | POA: Diagnosis present

## 2021-12-06 DIAGNOSIS — R0902 Hypoxemia: Secondary | ICD-10-CM | POA: Diagnosis not present

## 2021-12-06 DIAGNOSIS — J441 Chronic obstructive pulmonary disease with (acute) exacerbation: Secondary | ICD-10-CM | POA: Diagnosis present

## 2021-12-06 DIAGNOSIS — R0602 Shortness of breath: Secondary | ICD-10-CM | POA: Diagnosis not present

## 2021-12-06 DIAGNOSIS — Z743 Need for continuous supervision: Secondary | ICD-10-CM | POA: Diagnosis not present

## 2021-12-06 DIAGNOSIS — G4730 Sleep apnea, unspecified: Secondary | ICD-10-CM | POA: Diagnosis present

## 2021-12-06 DIAGNOSIS — Z801 Family history of malignant neoplasm of trachea, bronchus and lung: Secondary | ICD-10-CM

## 2021-12-06 DIAGNOSIS — R609 Edema, unspecified: Secondary | ICD-10-CM | POA: Diagnosis not present

## 2021-12-06 DIAGNOSIS — I482 Chronic atrial fibrillation, unspecified: Secondary | ICD-10-CM | POA: Diagnosis present

## 2021-12-06 DIAGNOSIS — F419 Anxiety disorder, unspecified: Secondary | ICD-10-CM | POA: Diagnosis present

## 2021-12-06 DIAGNOSIS — J80 Acute respiratory distress syndrome: Secondary | ICD-10-CM | POA: Diagnosis not present

## 2021-12-06 DIAGNOSIS — K5190 Ulcerative colitis, unspecified, without complications: Secondary | ICD-10-CM | POA: Diagnosis present

## 2021-12-06 DIAGNOSIS — Z9049 Acquired absence of other specified parts of digestive tract: Secondary | ICD-10-CM

## 2021-12-06 DIAGNOSIS — J9691 Respiratory failure, unspecified with hypoxia: Secondary | ICD-10-CM | POA: Diagnosis present

## 2021-12-06 DIAGNOSIS — J9621 Acute and chronic respiratory failure with hypoxia: Secondary | ICD-10-CM | POA: Diagnosis present

## 2021-12-06 DIAGNOSIS — K219 Gastro-esophageal reflux disease without esophagitis: Secondary | ICD-10-CM | POA: Diagnosis present

## 2021-12-06 DIAGNOSIS — Z79899 Other long term (current) drug therapy: Secondary | ICD-10-CM

## 2021-12-06 DIAGNOSIS — M545 Low back pain, unspecified: Secondary | ICD-10-CM | POA: Diagnosis present

## 2021-12-06 DIAGNOSIS — Z955 Presence of coronary angioplasty implant and graft: Secondary | ICD-10-CM

## 2021-12-06 DIAGNOSIS — Z8249 Family history of ischemic heart disease and other diseases of the circulatory system: Secondary | ICD-10-CM

## 2021-12-06 DIAGNOSIS — R062 Wheezing: Secondary | ICD-10-CM | POA: Diagnosis not present

## 2021-12-06 DIAGNOSIS — J439 Emphysema, unspecified: Secondary | ICD-10-CM | POA: Diagnosis present

## 2021-12-06 DIAGNOSIS — Z7982 Long term (current) use of aspirin: Secondary | ICD-10-CM

## 2021-12-06 DIAGNOSIS — G8929 Other chronic pain: Secondary | ICD-10-CM | POA: Diagnosis present

## 2021-12-06 DIAGNOSIS — I2510 Atherosclerotic heart disease of native coronary artery without angina pectoris: Secondary | ICD-10-CM | POA: Diagnosis present

## 2021-12-06 DIAGNOSIS — E876 Hypokalemia: Secondary | ICD-10-CM | POA: Diagnosis not present

## 2021-12-06 DIAGNOSIS — Z716 Tobacco abuse counseling: Secondary | ICD-10-CM

## 2021-12-06 DIAGNOSIS — E7800 Pure hypercholesterolemia, unspecified: Secondary | ICD-10-CM | POA: Diagnosis present

## 2021-12-06 DIAGNOSIS — D638 Anemia in other chronic diseases classified elsewhere: Secondary | ICD-10-CM | POA: Diagnosis present

## 2021-12-06 DIAGNOSIS — Z7951 Long term (current) use of inhaled steroids: Secondary | ICD-10-CM

## 2021-12-06 LAB — CBC WITH DIFFERENTIAL/PLATELET
Abs Immature Granulocytes: 0.06 10*3/uL (ref 0.00–0.07)
Basophils Absolute: 0 10*3/uL (ref 0.0–0.1)
Basophils Relative: 0 %
Eosinophils Absolute: 0 10*3/uL (ref 0.0–0.5)
Eosinophils Relative: 0 %
HCT: 39.2 % (ref 39.0–52.0)
Hemoglobin: 12.4 g/dL — ABNORMAL LOW (ref 13.0–17.0)
Immature Granulocytes: 1 %
Lymphocytes Relative: 4 %
Lymphs Abs: 0.5 10*3/uL — ABNORMAL LOW (ref 0.7–4.0)
MCH: 25.9 pg — ABNORMAL LOW (ref 26.0–34.0)
MCHC: 31.6 g/dL (ref 30.0–36.0)
MCV: 82 fL (ref 80.0–100.0)
Monocytes Absolute: 0.5 10*3/uL (ref 0.1–1.0)
Monocytes Relative: 4 %
Neutro Abs: 11 10*3/uL — ABNORMAL HIGH (ref 1.7–7.7)
Neutrophils Relative %: 91 %
Platelets: 336 10*3/uL (ref 150–400)
RBC: 4.78 MIL/uL (ref 4.22–5.81)
RDW: 14.5 % (ref 11.5–15.5)
WBC: 12.1 10*3/uL — ABNORMAL HIGH (ref 4.0–10.5)
nRBC: 0 % (ref 0.0–0.2)

## 2021-12-06 LAB — BLOOD GAS, ARTERIAL
Acid-Base Excess: 8.9 mmol/L — ABNORMAL HIGH (ref 0.0–2.0)
Bicarbonate: 37 mmol/L — ABNORMAL HIGH (ref 20.0–28.0)
Drawn by: 27016
O2 Saturation: 94.5 %
Patient temperature: 37.5
pCO2 arterial: 68 mmHg (ref 32–48)
pH, Arterial: 7.34 — ABNORMAL LOW (ref 7.35–7.45)
pO2, Arterial: 69 mmHg — ABNORMAL LOW (ref 83–108)

## 2021-12-06 LAB — GLUCOSE, CAPILLARY
Glucose-Capillary: 320 mg/dL — ABNORMAL HIGH (ref 70–99)
Glucose-Capillary: 361 mg/dL — ABNORMAL HIGH (ref 70–99)

## 2021-12-06 LAB — COMPREHENSIVE METABOLIC PANEL
ALT: 14 U/L (ref 0–44)
AST: 15 U/L (ref 15–41)
Albumin: 3.2 g/dL — ABNORMAL LOW (ref 3.5–5.0)
Alkaline Phosphatase: 83 U/L (ref 38–126)
Anion gap: 8 (ref 5–15)
BUN: 23 mg/dL (ref 8–23)
CO2: 31 mmol/L (ref 22–32)
Calcium: 8.3 mg/dL — ABNORMAL LOW (ref 8.9–10.3)
Chloride: 96 mmol/L — ABNORMAL LOW (ref 98–111)
Creatinine, Ser: 1.73 mg/dL — ABNORMAL HIGH (ref 0.61–1.24)
GFR, Estimated: 41 mL/min — ABNORMAL LOW (ref >60)
Glucose, Bld: 248 mg/dL — ABNORMAL HIGH (ref 70–99)
Potassium: 3.6 mmol/L (ref 3.5–5.1)
Sodium: 135 mmol/L (ref 135–145)
Total Bilirubin: 0.7 mg/dL (ref 0.3–1.2)
Total Protein: 6.8 g/dL (ref 6.5–8.1)

## 2021-12-06 LAB — LACTIC ACID, PLASMA: Lactic Acid, Venous: 1.2 mmol/L (ref 0.5–1.9)

## 2021-12-06 LAB — BRAIN NATRIURETIC PEPTIDE: B Natriuretic Peptide: 212 pg/mL — ABNORMAL HIGH (ref 0.0–100.0)

## 2021-12-06 MED ORDER — IPRATROPIUM-ALBUTEROL 0.5-2.5 (3) MG/3ML IN SOLN
3.0000 mL | Freq: Once | RESPIRATORY_TRACT | Status: AC
  Administered 2021-12-06: 3 mL via RESPIRATORY_TRACT
  Filled 2021-12-06: qty 3

## 2021-12-06 MED ORDER — ASPIRIN 81 MG PO TBEC
81.0000 mg | DELAYED_RELEASE_TABLET | Freq: Every day | ORAL | Status: DC
  Administered 2021-12-07 – 2021-12-10 (×4): 81 mg via ORAL
  Filled 2021-12-06 (×4): qty 1

## 2021-12-06 MED ORDER — ROSUVASTATIN CALCIUM 20 MG PO TABS
20.0000 mg | ORAL_TABLET | Freq: Every day | ORAL | Status: DC
  Administered 2021-12-07 – 2021-12-10 (×4): 20 mg via ORAL
  Filled 2021-12-06 (×4): qty 1

## 2021-12-06 MED ORDER — SODIUM CHLORIDE 0.9 % IV SOLN
2.0000 g | Freq: Once | INTRAVENOUS | Status: AC
  Administered 2021-12-06: 2 g via INTRAVENOUS
  Filled 2021-12-06: qty 20

## 2021-12-06 MED ORDER — DOXYCYCLINE HYCLATE 100 MG PO TABS
100.0000 mg | ORAL_TABLET | Freq: Two times a day (BID) | ORAL | Status: DC
  Administered 2021-12-06 – 2021-12-10 (×8): 100 mg via ORAL
  Filled 2021-12-06 (×8): qty 1

## 2021-12-06 MED ORDER — INSULIN ASPART 100 UNIT/ML IJ SOLN
0.0000 [IU] | Freq: Three times a day (TID) | INTRAMUSCULAR | Status: DC
  Administered 2021-12-06 – 2021-12-07 (×2): 4 [IU] via SUBCUTANEOUS
  Administered 2021-12-07: 3 [IU] via SUBCUTANEOUS
  Administered 2021-12-07: 4 [IU] via SUBCUTANEOUS

## 2021-12-06 MED ORDER — LOSARTAN POTASSIUM 50 MG PO TABS
100.0000 mg | ORAL_TABLET | Freq: Every day | ORAL | Status: DC
  Administered 2021-12-07 – 2021-12-10 (×4): 100 mg via ORAL
  Filled 2021-12-06 (×4): qty 2

## 2021-12-06 MED ORDER — ALPRAZOLAM 0.5 MG PO TABS
0.5000 mg | ORAL_TABLET | Freq: Two times a day (BID) | ORAL | Status: DC | PRN
  Administered 2021-12-08 – 2021-12-10 (×5): 0.5 mg via ORAL
  Filled 2021-12-06 (×5): qty 1

## 2021-12-06 MED ORDER — POLYETHYLENE GLYCOL 3350 17 G PO PACK
17.0000 g | PACK | Freq: Every day | ORAL | Status: DC | PRN
Start: 2021-12-06 — End: 2021-12-10

## 2021-12-06 MED ORDER — ONDANSETRON HCL 4 MG/2ML IJ SOLN: 4.0000 mg | Freq: Four times a day (QID) | INTRAMUSCULAR | Status: DC | PRN

## 2021-12-06 MED ORDER — NITROGLYCERIN 0.4 MG SL SUBL
0.4000 mg | SUBLINGUAL_TABLET | SUBLINGUAL | Status: DC | PRN
Start: 2021-12-06 — End: 2021-12-10

## 2021-12-06 MED ORDER — SODIUM CHLORIDE 0.9 % IV SOLN: INTRAVENOUS | Status: DC | PRN

## 2021-12-06 MED ORDER — ALBUTEROL SULFATE (2.5 MG/3ML) 0.083% IN NEBU: 2.5000 mg | INHALATION_SOLUTION | RESPIRATORY_TRACT | Status: DC | PRN

## 2021-12-06 MED ORDER — APIXABAN 5 MG PO TABS
5.0000 mg | ORAL_TABLET | Freq: Two times a day (BID) | ORAL | Status: DC
  Administered 2021-12-06 – 2021-12-10 (×8): 5 mg via ORAL
  Filled 2021-12-06 (×8): qty 1

## 2021-12-06 MED ORDER — ALBUTEROL SULFATE (2.5 MG/3ML) 0.083% IN NEBU
2.5000 mg | INHALATION_SOLUTION | Freq: Once | RESPIRATORY_TRACT | Status: AC
  Administered 2021-12-06: 2.5 mg via RESPIRATORY_TRACT
  Filled 2021-12-06: qty 3

## 2021-12-06 MED ORDER — FUROSEMIDE 40 MG PO TABS: 40.0000 mg | ORAL_TABLET | Freq: Every day | ORAL | Status: DC

## 2021-12-06 MED ORDER — PANTOPRAZOLE SODIUM 40 MG PO TBEC
40.0000 mg | DELAYED_RELEASE_TABLET | Freq: Every day | ORAL | Status: DC
  Administered 2021-12-07 – 2021-12-10 (×4): 40 mg via ORAL
  Filled 2021-12-06 (×4): qty 1

## 2021-12-06 MED ORDER — MAGNESIUM SULFATE 2 GM/50ML IV SOLN
2.0000 g | Freq: Once | INTRAVENOUS | Status: AC
  Administered 2021-12-06: 2 g via INTRAVENOUS
  Filled 2021-12-06: qty 50

## 2021-12-06 MED ORDER — ISOSORBIDE MONONITRATE 10 MG PO TABS
10.0000 mg | ORAL_TABLET | Freq: Two times a day (BID) | ORAL | Status: DC
  Administered 2021-12-06 – 2021-12-10 (×8): 10 mg via ORAL
  Filled 2021-12-06 (×10): qty 1

## 2021-12-06 MED ORDER — IPRATROPIUM-ALBUTEROL 0.5-2.5 (3) MG/3ML IN SOLN: 3.0000 mL | Freq: Once | RESPIRATORY_TRACT | Status: DC

## 2021-12-06 MED ORDER — NICOTINE 21 MG/24HR TD PT24
21.0000 mg | MEDICATED_PATCH | TRANSDERMAL | Status: DC
  Administered 2021-12-06 – 2021-12-09 (×4): 21 mg via TRANSDERMAL
  Filled 2021-12-06 (×4): qty 1

## 2021-12-06 MED ORDER — SODIUM CHLORIDE 0.9% FLUSH: 3.0000 mL | INTRAVENOUS | Status: DC | PRN

## 2021-12-06 MED ORDER — ACETAMINOPHEN 325 MG PO TABS
650.0000 mg | ORAL_TABLET | Freq: Four times a day (QID) | ORAL | Status: DC | PRN
  Administered 2021-12-07 – 2021-12-09 (×3): 650 mg via ORAL
  Filled 2021-12-06 (×3): qty 2

## 2021-12-06 MED ORDER — ALBUTEROL SULFATE (2.5 MG/3ML) 0.083% IN NEBU: 2.5000 mg | INHALATION_SOLUTION | Freq: Once | RESPIRATORY_TRACT | Status: DC

## 2021-12-06 MED ORDER — ACETAMINOPHEN 650 MG RE SUPP: 650.0000 mg | Freq: Four times a day (QID) | RECTAL | Status: DC | PRN

## 2021-12-06 MED ORDER — SODIUM CHLORIDE 0.9% FLUSH
3.0000 mL | Freq: Two times a day (BID) | INTRAVENOUS | Status: DC
  Administered 2021-12-06 – 2021-12-10 (×8): 3 mL via INTRAVENOUS

## 2021-12-06 MED ORDER — FUROSEMIDE 10 MG/ML IJ SOLN
40.0000 mg | Freq: Once | INTRAMUSCULAR | Status: AC
  Administered 2021-12-06: 40 mg via INTRAVENOUS
  Filled 2021-12-06: qty 4

## 2021-12-06 MED ORDER — METHYLPREDNISOLONE SODIUM SUCC 125 MG IJ SOLR
125.0000 mg | Freq: Once | INTRAMUSCULAR | Status: AC
  Administered 2021-12-06: 125 mg via INTRAVENOUS
  Filled 2021-12-06: qty 2

## 2021-12-06 MED ORDER — AMLODIPINE BESYLATE 5 MG PO TABS
5.0000 mg | ORAL_TABLET | Freq: Every day | ORAL | Status: DC
  Administered 2021-12-07 – 2021-12-10 (×4): 5 mg via ORAL
  Filled 2021-12-06 (×4): qty 1

## 2021-12-06 MED ORDER — METHYLPREDNISOLONE SODIUM SUCC 40 MG IJ SOLR
40.0000 mg | Freq: Two times a day (BID) | INTRAMUSCULAR | Status: DC
  Administered 2021-12-06 – 2021-12-10 (×8): 40 mg via INTRAVENOUS
  Filled 2021-12-06 (×8): qty 1

## 2021-12-06 MED ORDER — BISACODYL 10 MG RE SUPP: 10.0000 mg | Freq: Every day | RECTAL | Status: DC | PRN

## 2021-12-06 MED ORDER — BISOPROLOL FUMARATE 5 MG PO TABS
5.0000 mg | ORAL_TABLET | Freq: Every day | ORAL | Status: DC
  Administered 2021-12-07 – 2021-12-10 (×4): 5 mg via ORAL
  Filled 2021-12-06 (×4): qty 1

## 2021-12-06 MED ORDER — INSULIN ASPART 100 UNIT/ML IJ SOLN
0.0000 [IU] | Freq: Every day | INTRAMUSCULAR | Status: DC
  Administered 2021-12-06 – 2021-12-07 (×2): 5 [IU] via SUBCUTANEOUS

## 2021-12-06 MED ORDER — IPRATROPIUM-ALBUTEROL 0.5-2.5 (3) MG/3ML IN SOLN
3.0000 mL | Freq: Three times a day (TID) | RESPIRATORY_TRACT | Status: DC
  Administered 2021-12-06 – 2021-12-07 (×2): 3 mL via RESPIRATORY_TRACT
  Filled 2021-12-06 (×2): qty 3

## 2021-12-06 MED ORDER — AMIODARONE HCL 200 MG PO TABS
200.0000 mg | ORAL_TABLET | Freq: Every day | ORAL | Status: DC
  Administered 2021-12-07 – 2021-12-10 (×4): 200 mg via ORAL
  Filled 2021-12-06 (×4): qty 1

## 2021-12-06 MED ORDER — DM-GUAIFENESIN ER 30-600 MG PO TB12
1.0000 | ORAL_TABLET | Freq: Two times a day (BID) | ORAL | Status: DC
Start: 2021-12-06 — End: 2021-12-10
  Administered 2021-12-06 – 2021-12-10 (×8): 1 via ORAL
  Filled 2021-12-06 (×8): qty 1

## 2021-12-06 MED ORDER — SODIUM CHLORIDE 0.9% FLUSH
3.0000 mL | Freq: Two times a day (BID) | INTRAVENOUS | Status: DC
  Administered 2021-12-06 – 2021-12-10 (×7): 3 mL via INTRAVENOUS

## 2021-12-06 MED ORDER — ONDANSETRON HCL 4 MG PO TABS: 4.0000 mg | ORAL_TABLET | Freq: Four times a day (QID) | ORAL | Status: DC | PRN

## 2021-12-06 MED ORDER — ALBUTEROL SULFATE HFA 108 (90 BASE) MCG/ACT IN AERS: 2.0000 | INHALATION_SPRAY | RESPIRATORY_TRACT | Status: DC | PRN

## 2021-12-06 MED ORDER — TRAZODONE HCL 50 MG PO TABS
50.0000 mg | ORAL_TABLET | Freq: Every day | ORAL | Status: DC
  Administered 2021-12-06 – 2021-12-09 (×4): 50 mg via ORAL
  Filled 2021-12-06 (×4): qty 1

## 2021-12-06 MED ORDER — TRAZODONE HCL 50 MG PO TABS: 50.0000 mg | ORAL_TABLET | Freq: Every evening | ORAL | Status: DC | PRN

## 2021-12-06 MED ORDER — FUROSEMIDE 10 MG/ML IJ SOLN
40.0000 mg | Freq: Two times a day (BID) | INTRAMUSCULAR | Status: DC
  Administered 2021-12-06 – 2021-12-10 (×8): 40 mg via INTRAVENOUS
  Filled 2021-12-06 (×8): qty 4

## 2021-12-06 NOTE — ED Triage Notes (Signed)
Pt to ED via [facility] from home, a/ox4, c/o sob since yesterday. EMS states he was 80% on his regular 4l at home. Still smokes daily. Seen/hospitalized for same multiple times. 96% on 6l on arrival.

## 2021-12-06 NOTE — Progress Notes (Signed)
Pt arrived to room #[id number] from ED via stretcher.

## 2021-12-06 NOTE — ED Provider Notes (Signed)
[facility] Provider Note   CSN: [id number] Arrival date & time: 12/06/21  1205     History  Chief Complaint  Patient presents with   Shortness of Breath    Ronald [name] is a 76 y.o. male.  Patient complains of shortness of breath.  Patient has severe COPD and heart failure..  Patient has been intubated before  The history is provided by the patient and medical records. No language interpreter was used.  Shortness of Breath Severity:  Moderate Onset quality:  Sudden Timing:  Constant Progression:  Worsening Chronicity:  Recurrent Context: activity   Relieved by:  Nothing Worsened by:  Nothing Associated symptoms: no abdominal pain, no chest pain, no cough, no headaches and no rash        Home Medications Prior to Admission medications   Medication Sig Start Date End Date Taking? Authorizing Provider  albuterol (VENTOLIN HFA) 108 (90 Base) MCG/ACT inhaler Inhale 2 puffs into the lungs every 6 (six) hours as needed for wheezing or shortness of breath. 11/26/21   Denton Brick, Courage, MD  ALPRAZolam Duanne Moron) 0.5 MG tablet Take 1 tablet (0.5 mg total) by mouth 2 (two) times daily as needed. for anxiety 09/27/21   [name], FNP  amiodarone (PACERONE) 200 MG tablet Take 1 tablet by mouth once daily 11/11/21   [name] A, FNP  amLODipine (NORVASC) 5 MG tablet Take 1 tablet (5 mg total) by mouth daily. 10/03/21   [name] A, FNP  aspirin EC 81 MG EC tablet Take 1 tablet (81 mg total) by mouth daily. Swallow whole. 09/05/21   Phillips Grout, MD  bisoprolol (ZEBETA) 5 MG tablet Take 1 tablet (5 mg total) by mouth daily. In Place of Metoprolol 11/26/21   Roxan Hockey, MD  blood glucose meter kit and supplies Dispense based on patient and insurance preference. Use up to four times daily as directed. (FOR ICD-10 E10.9, E11.9). 07/05/21   [name], FNP  Blood Glucose Monitoring Suppl (ACCU-CHEK GUIDE ME) w/Device KIT Check BS daily Dx E11.9  05/16/20   [name] A, FNP  budesonide (PULMICORT) 0.5 MG/2ML nebulizer solution Take 2 mLs (0.5 mg total) by nebulization 2 (two) times daily. 07/05/21   Hawks, Christy A, FNP  ELIQUIS 5 MG TABS tablet Take 1 tablet (5 mg total) by mouth 2 (two) times daily. 08/09/21   [name], FNP  furosemide (LASIX) 40 MG tablet Take 1 tablet (40 mg total) by mouth daily. 11/26/21 12/26/21  Roxan Hockey, MD  glucose blood (ONE TOUCH ULTRA TEST) test strip USE TO CHECK GLUCOSE ONCE DAILY 02/11/18   Terald Sleeper, PA-C  ipratropium-albuterol (DUONEB) 0.5-2.5 (3) MG/3ML SOLN Take 3 mLs by nebulization every 6 (six) hours as needed (asthma). 11/26/21   Roxan Hockey, MD  isosorbide mononitrate (ISMO) 10 MG tablet Take 1 tablet by mouth twice daily 11/11/21   [name] A, FNP  losartan (COZAAR) 100 MG tablet Take 1 tablet (100 mg total) by mouth daily. 08/14/21   [name] A, FNP  nicotine (NICODERM CQ - DOSED IN MG/24 HOURS) 21 mg/24hr patch Place 1 patch (21 mg total) onto the skin daily for 28 days. 11/26/21 12/24/21  Roxan Hockey, MD  nitroGLYCERIN (NITROSTAT) 0.4 MG SL tablet Place 1 tablet (0.4 mg total) under the tongue every 5 (five) minutes as needed for chest pain. 10/04/21   [name], FNP  omeprazole (PRILOSEC) 20 MG capsule Take 1 capsule (20 mg total)  by mouth daily. 11/26/21   Roxan Hockey, MD  OXYGEN Inhale 3 L into the lungs at bedtime as needed (shortness of breath).    [provider]  rosuvastatin (CRESTOR) 20 MG tablet Take 1 tablet (20 mg total) by mouth daily. 11/26/21 05/25/22  Roxan Hockey, MD      Allergies    Gabapentin and Metformin and related    Review of Systems   Review of Systems  Constitutional:  Negative for appetite change and fatigue.  HENT:  Negative for congestion, ear discharge and sinus pressure.   Eyes:  Negative for discharge.  Respiratory:  Positive for shortness of breath. Negative for cough.   Cardiovascular:  Negative for  chest pain.  Gastrointestinal:  Negative for abdominal pain and diarrhea.  Genitourinary:  Negative for frequency and hematuria.  Musculoskeletal:  Negative for back pain.  Skin:  Negative for rash.  Neurological:  Negative for seizures and headaches.  Psychiatric/Behavioral:  Negative for hallucinations.     Physical Exam Updated Vital Signs BP 136/66   Pulse 81   Temp 99.4 F (37.4 C)   Resp (!) 29   Ht 5' 9"  (1.753 m)   Wt 84 kg   SpO2 90%   BMI 27.35 kg/m  Physical Exam Vitals and nursing note reviewed.  Constitutional:      Appearance: He is well-developed.  HENT:     Head: Normocephalic.     Nose: Nose normal.  Eyes:     General: No scleral icterus.    Conjunctiva/sclera: Conjunctivae normal.  Neck:     Thyroid: No thyromegaly.  Cardiovascular:     Rate and Rhythm: Regular rhythm. Tachycardia present.     Heart sounds: No murmur heard.    No friction rub. No gallop.  Pulmonary:     Breath sounds: No stridor. Wheezing present. No rales.     Comments: Tachypneic Chest:     Chest wall: No tenderness.  Abdominal:     General: There is no distension.     Tenderness: There is no abdominal tenderness. There is no rebound.  Musculoskeletal:        General: Normal range of motion.     Cervical back: Neck supple.  Lymphadenopathy:     Cervical: No cervical adenopathy.  Skin:    Findings: No erythema or rash.  Neurological:     Mental Status: He is alert and oriented to person, place, and time.     Motor: No abnormal muscle tone.     Coordination: Coordination normal.  Psychiatric:        Behavior: Behavior normal.     ED Results / Procedures / Treatments   Labs (all labs ordered are listed, but only abnormal results are displayed) Labs Reviewed  CBC WITH DIFFERENTIAL/PLATELET - Abnormal; Notable for the following components:      Result Value   WBC 12.1 (*)    Hemoglobin 12.4 (*)    MCH 25.9 (*)    Neutro Abs 11.0 (*)    Lymphs Abs 0.5 (*)    All  other components within normal limits  COMPREHENSIVE METABOLIC PANEL - Abnormal; Notable for the following components:   Chloride 96 (*)    Glucose, Bld 248 (*)    Creatinine, Ser 1.73 (*)    Calcium 8.3 (*)    Albumin 3.2 (*)    GFR, Estimated 41 (*)    All other components within normal limits  BLOOD GAS, ARTERIAL - Abnormal; Notable for the following components:  pH, Arterial 7.34 (*)    pCO2 arterial 68 (*)    pO2, Arterial 69 (*)    Bicarbonate 37.0 (*)    Acid-Base Excess 8.9 (*)    All other components within normal limits  CULTURE, BLOOD (ROUTINE X 2)  CULTURE, BLOOD (ROUTINE X 2)  LACTIC ACID, PLASMA  BRAIN NATRIURETIC PEPTIDE    EKG EKG Interpretation  Date/Time:  Friday December 06 2021 12:14:27 EDT Ventricular Rate:  84 PR Interval:  173 QRS Duration: 118 QT Interval:  411 QTC Calculation: 486 R Axis:   42 Text Interpretation: Sinus rhythm LVH with IVCD and secondary repol abnrm Borderline prolonged QT interval Confirmed by Milton Ferguson [phone]) on 12/06/2021 1:02:43 PM  Radiology DG Chest Port 1 View  Result Date: 12/06/2021 CLINICAL DATA:  PT complaining of SOB since yesterday. Everyday smoker HX. EXAM: PORTABLE CHEST - 1 VIEW COMPARISON:  11/25/2021 FINDINGS: Coarse perihilar interstitial markings as before. Some increase in ill-defined lower lobe interstitial opacities peripherally. Heart size and mediastinal contours are within normal limits. Aortic Atherosclerosis (ICD10-170.0). No effusion. Visualized bones unremarkable. IMPRESSION: Mild bibasilar interstitial infiltrates or edema, increased since previous Electronically Signed   By: Lucrezia Europe M.D.   On: 12/06/2021 12:43    Procedures Procedures    Medications Ordered in ED Medications  albuterol (VENTOLIN HFA) 108 (90 Base) MCG/ACT inhaler 2 puff (has no administration in time range)  ipratropium-albuterol (DUONEB) 0.5-2.5 (3) MG/3ML nebulizer solution 3 mL (3 mLs Nebulization Not Given 12/06/21 1330)   albuterol (PROVENTIL) (2.5 MG/3ML) 0.083% nebulizer solution 2.5 mg (2.5 mg Nebulization Not Given 12/06/21 1330)  cefTRIAXone (ROCEPHIN) 2 g in sodium chloride 0.9 % 100 mL IVPB (2 g Intravenous New Bag/Given 12/06/21 1416)  methylPREDNISolone sodium succinate (SOLU-MEDROL) 125 mg/2 mL injection 125 mg (125 mg Intravenous Given 12/06/21 1316)  magnesium sulfate IVPB 2 g 50 mL (0 g Intravenous Stopped 12/06/21 1417)  furosemide (LASIX) injection 40 mg (40 mg Intravenous Given 12/06/21 1354)  ipratropium-albuterol (DUONEB) 0.5-2.5 (3) MG/3ML nebulizer solution 3 mL (3 mLs Nebulization Given 12/06/21 1420)  albuterol (PROVENTIL) (2.5 MG/3ML) 0.083% nebulizer solution 2.5 mg (2.5 mg Nebulization Given 12/06/21 1420)    ED Course/ Medical Decision Making/ A&P  CRITICAL CARE Performed by: Milton Ferguson Total critical care time: 40 minutes Critical care time was exclusive of separately billable procedures and treating other patients. Critical care was necessary to treat or prevent imminent or life-threatening deterioration. Critical care was time spent personally by me on the following activities: development of treatment plan with patient and/or surrogate as well as nursing, discussions with consultants, evaluation of patient's response to treatment, examination of patient, obtaining history from patient or surrogate, ordering and performing treatments and interventions, ordering and review of laboratory studies, ordering and review of radiographic studies, pulse oximetry and re-evaluation of patient's condition.    Patient with worsening dyspnea secondary to heart failure and COPD.                           Medical Decision Making Amount and/or Complexity of Data Reviewed Labs: ordered.  Risk Prescription drug management. Decision regarding hospitalization.  This patient presents to the ED for concern of short of breath, this involves an extensive number of treatment options, and is a complaint  that carries with it a high risk of complications and morbidity.  The differential diagnosis includes COPD or heart failure   Co morbidities that complicate the patient evaluation  COPD  and heart failure   Additional history obtained:  Additional history obtained from patient External records from outside source obtained and reviewed including hospital record   Lab Tests:  I Ordered, and personally interpreted labs.  The pertinent results include: CBC shows white count 12,000, chemistry showed glucose 268, ABG shows patient retaining CO2 at 68   Imaging Studies ordered:  I ordered imaging studies including chest X I independently visualized and interpreted imaging which showed infiltrates in the bases I agree with the radiologist interpretation   Cardiac Monitoring: / EKG:  The patient was maintained on a cardiac monitor.  I personally viewed and interpreted the cardiac monitored which showed an underlying rhythm of: Sinus tach   Consultations Obtained:  I requested consultation with the hospital,  and discussed lab and imaging findings as well as pertinent plan - they recommend: Admit   Problem List / ED Course / Critical interventions / Medication management  Hypoxia, congestive heart failure, COPD I ordered medication including Lasix, albuterol and Atrovent Reevaluation of the patient after these medicines showed that the patient improved I have reviewed the patients home medicines and have made adjustments as needed   Social Determinants of Health:  Patient is   Test / Admission - Considered:  No additional test needed  Patient will be admitted for dyspnea hypoxia worsening heart failure and COPD        Final Clinical Impression(s) / ED Diagnoses Final diagnoses:  COPD exacerbation ([place])    Rx / DC Orders ED Discharge Orders     None         Milton Ferguson, MD 12/06/21 1626

## 2021-12-06 NOTE — ED Notes (Signed)
Attempted report x1. 

## 2021-12-06 NOTE — Plan of Care (Signed)

## 2021-12-06 NOTE — H&P (Signed)
Patient Demographics:    Ronald [name], is a 76 y.o. male  MRN: [id number]   DOB - 10/15/1945  Admit Date - 12/06/2021  Outpatient Primary MD for the patient is [name], FNP   Assessment & Plan:   Assessment and Plan:  1)Acute on chronic diastolic CHF exacerbation -BNP today is 212 up from 130 couple weeks ago -Chest x-ray with pulmonary edema  -EKG with sinus rhythm, LVH and abnormal report -Give IV Lasix -Monitor urine output, weight   2)AKI----acute kidney injury  Vs CKD 3A -Creatinine 1.73 which is close to baseline - renally adjust medications, avoid nephrotoxic agents / dehydration  / hypotension   3) acute on chronic hypoxic respiratory failure secondary to COPD exacerbation and compounded by CHF exacerbation --ABG with pH of 7.34 PCO2 of 68, PO2 69, O2 sats 94% -Patient has home O2 but does not really use it much because he smokes 1-1/2 to 2 packs daily -Continue supplemental oxygen -Recent VQ scan without evidence of PE -Renal function precludes CTA chest study -Patient insist he is compliant with Eliquis history of PE less likely   4)History of CAD with prior angioplasty/stents--- please see #1 above -Presentation not consistent with ACS -Continue aspirin and Crestor   5)HTN--- continue amlodipine, bisoprolol, losartan and isosorbide   6) chronic A-fib--- continue Eliquis for stroke prophylaxis and amiodarone for rate control, LFTs are not elevated   7) chronic anemia-- Hgb 12.4 -Monitor closely, no acute bleeding concerns  8) COPD--- mild exacerbation at this time, doxycycline as ordered, Solu-Medrol and bronchodilators as ordered  9)Social/Ethics-he is a full code, -Patient lives with his ex-wife, his son is his primary contact  -Discussed with patient's son and pt's neice,  questions answered -They are concerned that patient is not compliant with home O2 or medications -We will resume home health RN for cardiopulmonary assessment for medication compliance                                                                    Code Status :  -  Code Status: Full Code    Family Communication:    -Patient lives with his ex-wife, his son is his primary contact however -Spoke with  patient's niece [name]   Dispo: The patient is from: Home              Anticipated d/c is to: Home              Anticipated d/c date is: 1 day              Patient currently is not medically stable to d/c. Barriers: Not Clinically Stable-    With History of - Reviewed by me  Past Medical History:  Diagnosis Date   Anxiety    Asthma    Atrial flutter ([place]) 04/2021   Chronic diastolic CHF (congestive heart failure) (HCC)    Chronic lower back pain    COPD (chronic obstructive pulmonary disease) (HCC)    Coronary artery disease    a. NSTEMI 05/2014 s/p DESx2 to LAD at [facility].   Depression    GERD (gastroesophageal reflux disease)    High cholesterol    History of tracheostomy    Hypertension    NSTEMI (non-ST elevated myocardial infarction) ([place]) 05/2014   with stent placement   PAF (paroxysmal atrial fibrillation) ([place])    Sleep apnea    Stroke ([place]) 2017   anyeusym    TIA (transient ischemic attack)    "they say I've had some mini strokes; don't know when"; denies residual on 06/22/2014)   Tobacco abuse    Type II diabetes mellitus ([place])    Ulcerative colitis ([place])       Past Surgical History:  Procedure Laterality Date   APPENDECTOMY     BIOPSY  07/20/2020   Procedure: BIOPSY;  Surgeon: Harvel Quale, MD;  [facility];  Service: Gastroenterology;;   CARDIAC CATHETERIZATION  [phone] X 3   CHOLECYSTECTOMY     COLONOSCOPY WITH PROPOFOL N/A 07/20/2020   Procedure: COLONOSCOPY WITH PROPOFOL;  Surgeon: Harvel Quale, MD;  [facility];  Service: Gastroenterology;  Laterality: N/A;  1:15   CORONARY ANGIOPLASTY WITH STENT PLACEMENT  05/2014   "2"   ESOPHAGEAL DILATION N/A 07/20/2020   Procedure: ESOPHAGEAL DILATION;  Surgeon: Harvel Quale, MD;  [facility];  Service: Gastroenterology;  Laterality: N/A;   ESOPHAGOGASTRODUODENOSCOPY (EGD) WITH PROPOFOL N/A 07/20/2020   Procedure: ESOPHAGOGASTRODUODENOSCOPY (EGD) WITH PROPOFOL;  Surgeon: Harvel Quale, MD;  [facility];  Service: Gastroenterology;  Laterality: N/A;   IR GASTROSTOMY TUBE MOD SED  06/04/2021   IR GASTROSTOMY TUBE REMOVAL  07/24/2021   LEFT HEART CATH AND CORONARY ANGIOGRAPHY N/A 05/25/2020   Procedure: LEFT HEART CATH AND CORONARY ANGIOGRAPHY;  Surgeon: Martinique, Peter M, MD;  Location: [facility];  Service: Cardiovascular;  Laterality: N/A;   POLYPECTOMY  07/20/2020   Procedure: POLYPECTOMY INTESTINAL;  Surgeon: Montez Morita, Quillian Quince, MD;  [facility];  Service: Gastroenterology;;   TUMOR EXCISION Right ~ 1999   "side of my upper head"    Chief Complaint  Patient presents with   Shortness of Breath      HPI:    Ronald [name]  is a 76 y.o. male  with medical history significant of chronic respiratory failure on 3 LPM of oxygen, COPD, HFpEF, hypertension, hyperlipidemia, GERD, CAD s/p stent placement, ongoing tobacco abuse, noncompliance with home oxygen recently discharged from the hospital after treatment for CHF exacerbation who presents to the ED with worsening shortness of breath, -In the ED patient was found to be tachypneic with increased work of breathing and hypoxia -No fever  Or chills   No Nausea, Vomiting or Diarrhea At time of my evaluation patient denies any chest pains palpitations or dizziness no leg pains leg swelling or pleuritic symptoms -Additional history obtained from patient's niece [name] at bedside BNP today is 212 up from 130 couple weeks ago -Lactic acid is not  elevated -Creatinine 1.73 which is close to baseline -WBC 12.1, Hgb 12.4 -ABG with pH of 7.34 PCO2 of 68, PO2 69, O2 sats 94% -Chest x-ray with pulmonary  edema   Review of systems:    In addition to the HPI above,   A full Review of  Systems was done, all other systems reviewed are negative except as noted above in HPI , .    Social History:  Reviewed by me    Social History   Tobacco Use   Smoking status: Former    Packs/day: 1.50    Years: 48.00    Total pack years: 72.00    Types: Cigarettes    Start date: 02/12/1966    Quit date: 07/03/2021    Years since quitting: 0.4   Smokeless tobacco: Never   Tobacco comments:    smokes 5-6 cigarettes per day 06/07/2020  Substance Use Topics   Alcohol use: Not Currently    Alcohol/week: 0.0 standard drinks of alcohol     Family History :  Reviewed by me    Family History  Problem Relation Age of Onset   CAD Father    Lung cancer Brother        smoked   Cancer Brother        lung   Leukemia Sister    Dementia Sister    Stroke Mother    Emphysema Sister    Cancer Brother        lung    Home Medications:   Prior to Admission medications   Medication Sig Start Date End Date Taking? Authorizing Provider  albuterol (VENTOLIN HFA) 108 (90 Base) MCG/ACT inhaler Inhale 2 puffs into the lungs every 6 (six) hours as needed for wheezing or shortness of breath. 11/26/21  Yes Rogerio Boutelle, MD  ALPRAZolam (XANAX) 0.5 MG tablet Take 1 tablet (0.5 mg total) by mouth 2 (two) times daily as needed. for anxiety Patient taking differently: Take 0.5 mg by mouth 2 (two) times daily as needed for anxiety. for anxiety 09/27/21  Yes Hawks, Christy A, FNP  amiodarone (PACERONE) 200 MG tablet Take 1 tablet by mouth once daily Patient taking differently: Take 200 mg by mouth daily. 11/11/21  Yes Hawks, Christy A, FNP  amLODipine (NORVASC) 5 MG tablet Take 1 tablet (5 mg total) by mouth daily. 10/03/21  Yes Hawks, Christy A, FNP  aspirin EC 81  MG EC tablet Take 1 tablet (81 mg total) by mouth daily. Swallow whole. 09/05/21  Yes Phillips Grout, MD  bisoprolol (ZEBETA) 5 MG tablet Take 1 tablet (5 mg total) by mouth daily. In Place of Metoprolol 11/26/21  Yes Nethaniel Mattie, MD  budesonide (PULMICORT) 0.5 MG/2ML nebulizer solution Take 2 mLs (0.5 mg total) by nebulization 2 (two) times daily. 07/05/21  Yes Hawks, Christy A, FNP  ELIQUIS 5 MG TABS tablet Take 1 tablet (5 mg total) by mouth 2 (two) times daily. 08/09/21  Yes Hawks, Christy A, FNP  furosemide (LASIX) 40 MG tablet Take 1 tablet (40 mg total) by mouth daily. 11/26/21 12/26/21 Yes Tobiah Celestine, MD  ipratropium-albuterol (DUONEB) 0.5-2.5 (3) MG/3ML SOLN Take 3 mLs by nebulization every 6 (six) hours as needed (asthma). 11/26/21  Yes Ilias Stcharles, MD  isosorbide mononitrate (ISMO) 10 MG tablet Take 1 tablet by mouth twice daily Patient taking differently: Take 10 mg by mouth 2 (two) times daily. 11/11/21  Yes Hawks, Christy A, FNP  losartan (COZAAR) 100 MG tablet Take 1 tablet (100 mg total) by mouth daily. 08/14/21  Yes Hawks, Christy A, FNP  nicotine (NICODERM CQ - DOSED IN MG/24 HOURS) 21 mg/24hr patch Place 1 patch (21 mg total) onto the  skin daily for 28 days. 11/26/21 12/24/21 Yes Edvardo Honse, MD  nitroGLYCERIN (NITROSTAT) 0.4 MG SL tablet Place 1 tablet (0.4 mg total) under the tongue every 5 (five) minutes as needed for chest pain. 10/04/21  Yes Hawks, Christy A, FNP  omeprazole (PRILOSEC) 20 MG capsule Take 1 capsule (20 mg total) by mouth daily. 11/26/21  Yes Nehan Flaum, MD  rosuvastatin (CRESTOR) 20 MG tablet Take 1 tablet (20 mg total) by mouth daily. 11/26/21 05/25/22 Yes Ansley Stanwood, MD  glucose blood (ONE TOUCH ULTRA TEST) test strip USE TO CHECK GLUCOSE ONCE DAILY 02/11/18   Terald Sleeper, PA-C  OXYGEN Inhale 3 L into the lungs at bedtime as needed (shortness of breath).    [provider]     Allergies:     Allergies  Allergen Reactions    Gabapentin Anxiety    Unknown reaction   Metformin And Related Rash     Physical Exam:   Vitals  Blood pressure (!) 128/59, pulse 78, temperature 98.1 F (36.7 C), temperature source Oral, resp. rate 18, height '5\' 9"'$  (1.753 m), weight 80.8 kg, SpO2 95 %.  Physical Examination: General appearance - alert, conversational dyspnea Mental status - alert, oriented to person, place, and time,  Nose- [place] 5 L/min (patient was on 3 L at home PTA) Eyes - sclera anicteric Neck - supple, no JVD elevation , Chest -diminished breath sounds, few scattered rales  heart - S1 and S2 normal, regular  Abdomen - soft, nontender, nondistended, +BS Neurological - screening mental status exam normal, neck supple without rigidity, cranial nerves II through XII intact, DTR's normal and symmetric Extremities -bilateral upper extremity edema/lymphedema intact peripheral pulses  Skin - warm, dry     Data Review:    CBC Recent Labs  Lab 12/06/21 1310  WBC 12.1*  HGB 12.4*  HCT 39.2  PLT 336  MCV 82.0  MCH 25.9*  MCHC 31.6  RDW 14.5  LYMPHSABS 0.5*  MONOABS 0.5  EOSABS 0.0  BASOSABS 0.0   ------------------------------------------------------------------------------------------------------------------  Chemistries  Recent Labs  Lab 12/06/21 1310  NA 135  K 3.6  CL 96*  CO2 31  GLUCOSE 248*  BUN 23  CREATININE 1.73*  CALCIUM 8.3*  AST 15  ALT 14  ALKPHOS 83  BILITOT 0.7   ------------------------------------------------------------------------------------------------------------------ estimated creatinine clearance is 38 mL/min (A) (by C-G formula based on SCr of 1.73 mg/dL (H)). ------------------------------------------------------------------------------------------------------------------ No results for input(s): "TSH", "T4TOTAL", "T3FREE", "THYROIDAB" in the last 72 hours.  Invalid input(s): "FREET3"   Coagulation profile No results for input(s): "INR", "PROTIME" in the  last 168 hours. ------------------------------------------------------------------------------------------------------------------- No results for input(s): "DDIMER" in the last 72 hours. -------------------------------------------------------------------------------------------------------------------  Cardiac Enzymes No results for input(s): "CKMB", "TROPONINI", "MYOGLOBIN" in the last 168 hours.  Invalid input(s): "CK" ------------------------------------------------------------------------------------------------------------------    Component Value Date/Time   BNP 212.0 (H) 12/06/2021 1312   ---------------------------------------------------------------------------------------------------------------  Urinalysis    Component Value Date/Time   COLORURINE YELLOW 07/10/2021 0750   APPEARANCEUR CLEAR 07/10/2021 0750   APPEARANCEUR Clear 04/18/2021 1539   LABSPEC >1.030 (H) 07/10/2021 0750   PHURINE 5.5 07/10/2021 0750   GLUCOSEU 100 (A) 07/10/2021 0750   HGBUR TRACE (A) 07/10/2021 0750   BILIRUBINUR NEGATIVE 07/10/2021 0750   BILIRUBINUR Negative 04/18/2021 1539   KETONESUR NEGATIVE 07/10/2021 0750   PROTEINUR 30 (A) 07/10/2021 0750   UROBILINOGEN 0.2 06/22/2014 1918   NITRITE NEGATIVE 07/10/2021 0750   LEUKOCYTESUR NEGATIVE 07/10/2021 0750    ----------------------------------------------------------------------------------------------------------------   Imaging Results:  DG Chest Port 1 View  Result Date: 12/06/2021 CLINICAL DATA:  PT complaining of SOB since yesterday. Everyday smoker HX. EXAM: PORTABLE CHEST - 1 VIEW COMPARISON:  11/25/2021 FINDINGS: Coarse perihilar interstitial markings as before. Some increase in ill-defined lower lobe interstitial opacities peripherally. Heart size and mediastinal contours are within normal limits. Aortic Atherosclerosis (ICD10-170.0). No effusion. Visualized bones unremarkable. IMPRESSION: Mild bibasilar interstitial infiltrates  or edema, increased since previous Electronically Signed   By: Lucrezia Europe M.D.   On: 12/06/2021 12:43    Radiological Exams on Admission: DG Chest Port 1 View  Result Date: 12/06/2021 CLINICAL DATA:  PT complaining of SOB since yesterday. Everyday smoker HX. EXAM: PORTABLE CHEST - 1 VIEW COMPARISON:  11/25/2021 FINDINGS: Coarse perihilar interstitial markings as before. Some increase in ill-defined lower lobe interstitial opacities peripherally. Heart size and mediastinal contours are within normal limits. Aortic Atherosclerosis (ICD10-170.0). No effusion. Visualized bones unremarkable. IMPRESSION: Mild bibasilar interstitial infiltrates or edema, increased since previous Electronically Signed   By: Lucrezia Europe M.D.   On: 12/06/2021 12:43    DVT Prophylaxis -SCD  /Eliquis AM Labs Ordered, also please review Full Orders  Family Communication: Admission, patients condition and plan of care including tests being ordered have been discussed with the patient and Niece [name] who indicate understanding and agree with the plan   Condition   stable  Roxan Hockey M.D on 12/06/2021 at 8:00 PM Go to [email or web address] -  for contact info  [facility] - Office  [phone]

## 2021-12-07 DIAGNOSIS — Z7982 Long term (current) use of aspirin: Secondary | ICD-10-CM | POA: Diagnosis not present

## 2021-12-07 DIAGNOSIS — M545 Low back pain, unspecified: Secondary | ICD-10-CM | POA: Diagnosis not present

## 2021-12-07 DIAGNOSIS — I110 Hypertensive heart disease with heart failure: Secondary | ICD-10-CM | POA: Diagnosis not present

## 2021-12-07 DIAGNOSIS — Z7901 Long term (current) use of anticoagulants: Secondary | ICD-10-CM | POA: Diagnosis not present

## 2021-12-07 DIAGNOSIS — K219 Gastro-esophageal reflux disease without esophagitis: Secondary | ICD-10-CM | POA: Diagnosis not present

## 2021-12-07 DIAGNOSIS — D638 Anemia in other chronic diseases classified elsewhere: Secondary | ICD-10-CM | POA: Diagnosis not present

## 2021-12-07 DIAGNOSIS — G4730 Sleep apnea, unspecified: Secondary | ICD-10-CM | POA: Diagnosis not present

## 2021-12-07 DIAGNOSIS — I252 Old myocardial infarction: Secondary | ICD-10-CM | POA: Diagnosis not present

## 2021-12-07 DIAGNOSIS — E7800 Pure hypercholesterolemia, unspecified: Secondary | ICD-10-CM | POA: Diagnosis not present

## 2021-12-07 DIAGNOSIS — J441 Chronic obstructive pulmonary disease with (acute) exacerbation: Secondary | ICD-10-CM | POA: Diagnosis not present

## 2021-12-07 DIAGNOSIS — I482 Chronic atrial fibrillation, unspecified: Secondary | ICD-10-CM | POA: Diagnosis not present

## 2021-12-07 DIAGNOSIS — G8929 Other chronic pain: Secondary | ICD-10-CM | POA: Diagnosis not present

## 2021-12-07 DIAGNOSIS — E119 Type 2 diabetes mellitus without complications: Secondary | ICD-10-CM | POA: Diagnosis not present

## 2021-12-07 DIAGNOSIS — J439 Emphysema, unspecified: Secondary | ICD-10-CM | POA: Diagnosis not present

## 2021-12-07 DIAGNOSIS — N179 Acute kidney failure, unspecified: Secondary | ICD-10-CM | POA: Diagnosis not present

## 2021-12-07 DIAGNOSIS — R0602 Shortness of breath: Secondary | ICD-10-CM | POA: Diagnosis not present

## 2021-12-07 DIAGNOSIS — I2510 Atherosclerotic heart disease of native coronary artery without angina pectoris: Secondary | ICD-10-CM | POA: Diagnosis not present

## 2021-12-07 DIAGNOSIS — F419 Anxiety disorder, unspecified: Secondary | ICD-10-CM | POA: Diagnosis not present

## 2021-12-07 DIAGNOSIS — J9621 Acute and chronic respiratory failure with hypoxia: Secondary | ICD-10-CM | POA: Diagnosis not present

## 2021-12-07 DIAGNOSIS — I5033 Acute on chronic diastolic (congestive) heart failure: Secondary | ICD-10-CM | POA: Diagnosis not present

## 2021-12-07 DIAGNOSIS — J9811 Atelectasis: Secondary | ICD-10-CM | POA: Diagnosis not present

## 2021-12-07 DIAGNOSIS — Z888 Allergy status to other drugs, medicaments and biological substances status: Secondary | ICD-10-CM | POA: Diagnosis not present

## 2021-12-07 DIAGNOSIS — Z9981 Dependence on supplemental oxygen: Secondary | ICD-10-CM | POA: Diagnosis not present

## 2021-12-07 DIAGNOSIS — F17210 Nicotine dependence, cigarettes, uncomplicated: Secondary | ICD-10-CM | POA: Diagnosis not present

## 2021-12-07 DIAGNOSIS — K5190 Ulcerative colitis, unspecified, without complications: Secondary | ICD-10-CM | POA: Diagnosis not present

## 2021-12-07 DIAGNOSIS — Z8673 Personal history of transient ischemic attack (TIA), and cerebral infarction without residual deficits: Secondary | ICD-10-CM | POA: Diagnosis not present

## 2021-12-07 LAB — BASIC METABOLIC PANEL
Anion gap: 7 (ref 5–15)
BUN: 26 mg/dL — ABNORMAL HIGH (ref 8–23)
CO2: 34 mmol/L — ABNORMAL HIGH (ref 22–32)
Calcium: 8.3 mg/dL — ABNORMAL LOW (ref 8.9–10.3)
Chloride: 95 mmol/L — ABNORMAL LOW (ref 98–111)
Creatinine, Ser: 1.61 mg/dL — ABNORMAL HIGH (ref 0.61–1.24)
GFR, Estimated: 45 mL/min — ABNORMAL LOW (ref >60)
Glucose, Bld: 271 mg/dL — ABNORMAL HIGH (ref 70–99)
Potassium: 3.4 mmol/L — ABNORMAL LOW (ref 3.5–5.1)
Sodium: 136 mmol/L (ref 135–145)

## 2021-12-07 LAB — CBC
HCT: 35 % — ABNORMAL LOW (ref 39.0–52.0)
Hemoglobin: 11.2 g/dL — ABNORMAL LOW (ref 13.0–17.0)
MCH: 26.2 pg (ref 26.0–34.0)
MCHC: 32 g/dL (ref 30.0–36.0)
MCV: 82 fL (ref 80.0–100.0)
Platelets: 274 10*3/uL (ref 150–400)
RBC: 4.27 MIL/uL (ref 4.22–5.81)
RDW: 14.4 % (ref 11.5–15.5)
WBC: 8.1 10*3/uL (ref 4.0–10.5)
nRBC: 0 % (ref 0.0–0.2)

## 2021-12-07 LAB — GLUCOSE, CAPILLARY
Glucose-Capillary: 253 mg/dL — ABNORMAL HIGH (ref 70–99)
Glucose-Capillary: 309 mg/dL — ABNORMAL HIGH (ref 70–99)
Glucose-Capillary: 338 mg/dL — ABNORMAL HIGH (ref 70–99)
Glucose-Capillary: 385 mg/dL — ABNORMAL HIGH (ref 70–99)

## 2021-12-07 MED ORDER — IPRATROPIUM-ALBUTEROL 0.5-2.5 (3) MG/3ML IN SOLN
3.0000 mL | Freq: Two times a day (BID) | RESPIRATORY_TRACT | Status: DC
  Administered 2021-12-07 – 2021-12-10 (×6): 3 mL via RESPIRATORY_TRACT
  Filled 2021-12-07 (×6): qty 3

## 2021-12-07 NOTE — Progress Notes (Signed)
PROGRESS NOTE     Ronald [name], is a 76 y.o. male, DOB - [birth year], GQQ:[id number]  Admit date - 12/06/2021   Admitting Physician Ronald Reale Denton Brick, MD  Outpatient Primary MD for the patient is Ronald Balloon, FNP  LOS - 0  Chief Complaint  Patient presents with   Shortness of Breath        Brief Narrative:  76 y.o. male with medical history significant of chronic respiratory failure on 3 LPM of oxygen, COPD, HFpEF, hypertension, hyperlipidemia, GERD, CAD s/p stent placement admitted on 12/06/2021 with worsening respiratory status with concerns for acute COPD exacerbation and acute CHF exacerbation with flat troponins  A/p 1)Acute on chronic diastolic CHF exacerbation -BNP today is 212 up from 130 couple weeks ago -Chest x-ray with pulmonary edema  -EKG with sinus rhythm, LVH and abnormal report -Continue IV Lasix -Monitor daily weights and fluid output and input From   2)AKI----acute kidney injury  Vs CKD 3A -Creatinine on admission was 1.73 which is close to baseline - renally adjust medications, avoid nephrotoxic agents / dehydration  / hypotension   3) acute on chronic hypoxic respiratory failure secondary to COPD exacerbation and compounded by CHF exacerbation --ABG with pH of 7.34 PCO2 of 68, PO2 69, O2 sats 94% -Patient has home O2 but does not really use it much because he smokes 1-1/2 to 2 packs daily -Continue supplemental oxygen -Recent VQ scan without evidence of PE -Renal function precludes CTA chest study -Patient insist he is compliant with Eliquis history of PE less likely   4)History of CAD with prior angioplasty/stents--- please see #1 above -Presentation not consistent with ACS -Continue aspirin and Crestor   5)HTN--- continue amlodipine, bisoprolol, losartan and isosorbide    6) chronic A-fib--- continue Eliquis for stroke prophylaxis and amiodarone for rate control, LFTs are not elevated   7) chronic anemia-- Hgb stable, close to recent  baseline -Monitor closely, no acute bleeding concerns   8) COPD--- mild exacerbation at this time, doxycycline as ordered, Solu-Medrol and bronchodilators as ordered   9) hypokalemia--- due to Lasix/diuresis, replace and recheck  10)Social/Ethics-he is a full code, -Patient lives with his ex-wife, his son is his primary contact  -Discussed with patient's son and pt's neice, questions answered -They are concerned that patient is not compliant with home O2 or medications -We will resume home health RN for cardiopulmonary assessment for medication compliance                                                                    Code Status :  -  Code Status: Full Code    Family Communication:    -Patient lives with his ex-wife, his son is his primary contact however -Spoke with  patient's niece Ronald [name]   Dispo: The patient is from: Home              Anticipated d/c is to: Home              Anticipated d/c date is: 1 day              Patient currently is not medically stable to d/c. Barriers: Not Clinically Stable-   DVT Prophylaxis  :   - SCDs   SCDs Start: 12/06/21  [place] TED hose Start: 12/06/21 1638 apixaban (ELIQUIS) tablet 5 mg   Lab Results  Component Value Date   PLT 274 12/07/2021    Inpatient Medications  Scheduled Meds:  amiodarone  200 mg Oral Daily   amLODipine  5 mg Oral Daily   apixaban  5 mg Oral BID   aspirin EC  81 mg Oral Daily   bisoprolol  5 mg Oral Daily   dextromethorphan-guaiFENesin  1 tablet Oral BID   doxycycline  100 mg Oral Q12H   furosemide  40 mg Intravenous BID   insulin aspart  0-5 Units Subcutaneous QHS   insulin aspart  0-6 Units Subcutaneous TID WC   ipratropium-albuterol  3 mL Nebulization BID   isosorbide mononitrate  10 mg Oral BID   losartan  100 mg Oral Daily   methylPREDNISolone (SOLU-MEDROL) injection  40 mg Intravenous Q12H   nicotine  21 mg Transdermal Q24H   pantoprazole  40 mg Oral Daily   rosuvastatin  20 mg Oral Daily    sodium chloride flush  3 mL Intravenous Q12H   sodium chloride flush  3 mL Intravenous Q12H   traZODone  50 mg Oral QHS   Continuous Infusions:  sodium chloride     PRN Meds:.sodium chloride, acetaminophen **OR** acetaminophen, albuterol, ALPRAZolam, bisacodyl, nitroGLYCERIN, ondansetron **OR** ondansetron (ZOFRAN) IV, polyethylene glycol, sodium chloride flush   Anti-infectives (From admission, onward)    Start     Dose/Rate Route Frequency Ordered Stop   12/06/21 2200  doxycycline (VIBRA-TABS) tablet 100 mg        100 mg Oral Every 12 hours 12/06/21 1637     12/06/21 1315  cefTRIAXone (ROCEPHIN) 2 g in sodium chloride 0.9 % 100 mL IVPB        2 g 200 mL/hr over 30 Minutes Intravenous  Once 12/06/21 1301 12/06/21 1508         Subjective: Ronald [name] today has no fevers, no emesis,  No chest pain,   - Cough and wheezing persist -Fatigue and dyspnea on exertion persist -Voiding okay   Objective: Vitals:   12/07/21 0018 12/07/21 0407 12/07/21 0730 12/07/21 1400  BP: (!) 120/47 131/61  132/66  Pulse: 64 74  76  Resp: '18 17  18  '$ Temp: 98.2 F (36.8 C) 98 F (36.7 C)  98.4 F (36.9 C)  TempSrc: Oral Oral  Oral  SpO2: 99% 100% 95% 95%  Weight:      Height:        Intake/Output Summary (Last 24 hours) at 12/07/2021 1852 Last data filed at 12/07/2021 1811 Gross per 24 hour  Intake 840 ml  Output 3000 ml  Net -2160 ml   Filed Weights   12/06/21 1212 12/06/21 1624  Weight: 84 kg 80.8 kg    Physical Exam  Gen:- Awake Alert, dyspnea on exertion persist HEENT:- [place].AT, No sclera icterus Nose- [place] 4L/min Neck-Supple Neck,No JVD,.  Lungs-improving air movement, few wheezes  CV- S1, S2 normal, regular  Abd-  +ve B.Sounds, Abd Soft, No tenderness,    Extremity/Skin:- Extremities -bilateral upper extremity edema/lymphedema intact peripheral pulses , pedal pulses present  Psych-affect is appropriate, oriented x3 Neuro-generalized weakness, no new focal deficits, no  tremors  Data Reviewed: I have personally reviewed following labs and imaging studies  CBC: Recent Labs  Lab 12/06/21 1310 12/07/21 0627  WBC 12.1* 8.1  NEUTROABS 11.0*  --   HGB 12.4* 11.2*  HCT 39.2 35.0*  MCV 82.0 82.0  PLT 336 274  Basic Metabolic Panel: Recent Labs  Lab 12/06/21 1310 12/07/21 0627  NA 135 136  K 3.6 3.4*  CL 96* 95*  CO2 31 34*  GLUCOSE 248* 271*  BUN 23 26*  CREATININE 1.73* 1.61*  CALCIUM 8.3* 8.3*   GFR: Estimated Creatinine Clearance: 40.9 mL/min (A) (by C-G formula based on SCr of 1.61 mg/dL (H)). Liver Function Tests: Recent Labs  Lab 12/06/21 1310  AST 15  ALT 14  ALKPHOS 83  BILITOT 0.7  PROT 6.8  ALBUMIN 3.2*   Cardiac Enzymes: No results for input(s): "CKTOTAL", "CKMB", "CKMBINDEX", "TROPONINI" in the last 168 hours. BNP (last 3 results) No results for input(s): "PROBNP" in the last 8760 hours. HbA1C: No results for input(s): "HGBA1C" in the last 72 hours. Sepsis Labs: '@LABRCNTIP'$ (procalcitonin:4,lacticidven:4) ) Recent Results (from the past 240 hour(s))  Blood culture (routine x 2)     Status: None (Preliminary result)   Collection Time: 12/06/21  1:13 PM   Specimen: Left Antecubital; Blood  Result Value Ref Range Status   Specimen Description LEFT ANTECUBITAL  Final   Special Requests   Final    BOTTLES DRAWN AEROBIC AND ANAEROBIC Blood Culture adequate volume   Culture   Final    NO GROWTH < 12 HOURS Performed at [facility], [address] [place], [place] [address]    Report Status PENDING  Incomplete  Blood culture (routine x 2)     Status: None (Preliminary result)   Collection Time: 12/06/21  1:14 PM   Specimen: BLOOD LEFT HAND  Result Value Ref Range Status   Specimen Description BLOOD LEFT HAND  Final   Special Requests   Final    BOTTLES DRAWN AEROBIC AND ANAEROBIC Blood Culture adequate volume   Culture   Final    NO GROWTH < 12 HOURS Performed at [facility], [address] [place], [place]  [address]    Report Status PENDING  Incomplete      Radiology Studies: DG Chest Port 1 View  Result Date: 12/06/2021 CLINICAL DATA:  PT complaining of SOB since yesterday. Everyday smoker HX. EXAM: PORTABLE CHEST - 1 VIEW COMPARISON:  11/25/2021 FINDINGS: Coarse perihilar interstitial markings as before. Some increase in ill-defined lower lobe interstitial opacities peripherally. Heart size and mediastinal contours are within normal limits. Aortic Atherosclerosis (ICD10-170.0). No effusion. Visualized bones unremarkable. IMPRESSION: Mild bibasilar interstitial infiltrates or edema, increased since previous Electronically Signed   By: Lucrezia Europe M.D.   On: 12/06/2021 12:43     Scheduled Meds:  amiodarone  200 mg Oral Daily   amLODipine  5 mg Oral Daily   apixaban  5 mg Oral BID   aspirin EC  81 mg Oral Daily   bisoprolol  5 mg Oral Daily   dextromethorphan-guaiFENesin  1 tablet Oral BID   doxycycline  100 mg Oral Q12H   furosemide  40 mg Intravenous BID   insulin aspart  0-5 Units Subcutaneous QHS   insulin aspart  0-6 Units Subcutaneous TID WC   ipratropium-albuterol  3 mL Nebulization BID   isosorbide mononitrate  10 mg Oral BID   losartan  100 mg Oral Daily   methylPREDNISolone (SOLU-MEDROL) injection  40 mg Intravenous Q12H   nicotine  21 mg Transdermal Q24H   pantoprazole  40 mg Oral Daily   rosuvastatin  20 mg Oral Daily   sodium chloride flush  3 mL Intravenous Q12H   sodium chloride flush  3 mL Intravenous Q12H   traZODone  50 mg  Oral QHS   Continuous Infusions:  sodium chloride       LOS: 0 days    Roxan Hockey M.D on 12/07/2021 at 6:52 PM  Go to [email or web address] - for contact info  [facility] - Office  [phone]  If 7PM-7AM, please contact night-coverage [email or web address] 12/07/2021, 6:52 PM

## 2021-12-08 DIAGNOSIS — I5033 Acute on chronic diastolic (congestive) heart failure: Secondary | ICD-10-CM | POA: Diagnosis not present

## 2021-12-08 LAB — GLUCOSE, CAPILLARY
Glucose-Capillary: 287 mg/dL — ABNORMAL HIGH (ref 70–99)
Glucose-Capillary: 357 mg/dL — ABNORMAL HIGH (ref 70–99)
Glucose-Capillary: 268 mg/dL — ABNORMAL HIGH (ref 70–99)
Glucose-Capillary: 328 mg/dL — ABNORMAL HIGH (ref 70–99)

## 2021-12-08 MED ORDER — INSULIN ASPART 100 UNIT/ML IJ SOLN
0.0000 [IU] | Freq: Every day | INTRAMUSCULAR | Status: DC
  Administered 2021-12-08: 4 [IU] via SUBCUTANEOUS

## 2021-12-08 MED ORDER — INSULIN ASPART 100 UNIT/ML IJ SOLN
0.0000 [IU] | Freq: Three times a day (TID) | INTRAMUSCULAR | Status: DC
  Administered 2021-12-08 (×2): 5 [IU] via SUBCUTANEOUS
  Administered 2021-12-08: 9 [IU] via SUBCUTANEOUS
  Administered 2021-12-09: 5 [IU] via SUBCUTANEOUS

## 2021-12-08 MED ORDER — INSULIN GLARGINE-YFGN 100 UNIT/ML ~~LOC~~ SOLN
10.0000 [IU] | Freq: Every day | SUBCUTANEOUS | Status: DC
  Administered 2021-12-08: 10 [IU] via SUBCUTANEOUS
  Filled 2021-12-08 (×3): qty 0.1

## 2021-12-08 NOTE — Plan of Care (Signed)

## 2021-12-08 NOTE — Progress Notes (Signed)
PROGRESS NOTE     Ronald [name], is a 76 y.o. male, DOB - November 11, 1945, VOH:[id number]  Admit date - 12/06/2021   Admitting Physician Lailanie Hasley Denton Brick, MD  Outpatient Primary MD for the patient is [name], FNP  LOS - 1  Chief Complaint  Patient presents with   Shortness of Breath        Brief Narrative:  76 y.o. male with medical history significant of chronic respiratory failure on 3 LPM of oxygen, COPD, HFpEF, hypertension, hyperlipidemia, GERD, CAD s/p stent placement admitted on 12/06/2021 with worsening respiratory status with concerns for acute COPD exacerbation and acute CHF exacerbation with flat troponins  A/p 1)Acute on chronic diastolic CHF exacerbation -BNP this admission is 212 up from 130 couple weeks PTA -Chest x-ray with pulmonary edema on admission  -EKG with sinus rhythm, LVH and abnormal report -Dyspnea improving with IV Lasix-- -Monitor daily weights and fluid output and input From -Repeat chest x-ray on 12/09/2021   2)AKI----acute kidney injury  Vs CKD 3A -Creatinine on admission was 1.73 which is close to baseline - renally adjust medications, avoid nephrotoxic agents / dehydration  / hypotension   3) acute on chronic hypoxic respiratory failure secondary to COPD exacerbation and compounded by CHF exacerbation --ABG with pH of 7.34 PCO2 of 68, PO2 69, O2 sats 94% -Patient has home O2 but does not really use it much because he smokes 1-1/2 to 2 packs daily -Recent VQ scan without evidence of PE -Renal function precludes CTA chest study -Patient insist he is compliant with Eliquis history of PE less likely -Oxygen requirement improving getting close to baseline from an oxygen requirement standpoint -Repeat chest x-ray on 12/10/2018   4)History of CAD with prior angioplasty/stents--- please see #1 above -Presentation not consistent with ACS -Remains chest pain-free -Continue aspirin and Crestor   5)HTN--- continue amlodipine, bisoprolol, losartan and  isosorbide    6) chronic A-fib--- continue Eliquis for stroke prophylaxis and amiodarone for rate control, LFTs are not elevated   7) chronic anemia-- Hgb stable, close to recent baseline -Monitor closely, no acute bleeding concerns   8) COPD--- mild exacerbation at this time, doxycycline as ordered, continue Solu-Medrol and bronchodilators as ordered -Dyspnea and oxygen requirement improving   9) hypokalemia--- due to Lasix/diuresis, replace and recheck  10)Social/Ethics-he is a full code, -Patient lives with his ex-wife, his son is his primary contact  -Discussed with patient's son and pt's neice, questions answered -They are concerned that patient is not compliant with home O2 or medications -We will resume home health RN for cardiopulmonary assessment for medication compliance                                                                    Code Status :  -  Code Status: Full Code    Family Communication:    -Patient lives with his ex-wife, his son is his primary contact however -Spoke with  patient's niece [name]   Dispo: The patient is from: Home              Anticipated d/c is to: Home              Anticipated d/c date is: 1 day  Patient currently is not medically stable to d/c. Barriers: Not Clinically Stable-   DVT Prophylaxis  :   - SCDs   SCDs Start: 12/06/21 1638 Place TED hose Start: 12/06/21 1638 apixaban (ELIQUIS) tablet 5 mg   Lab Results  Component Value Date   PLT 274 12/07/2021    Inpatient Medications  Scheduled Meds:  amiodarone  200 mg Oral Daily   amLODipine  5 mg Oral Daily   apixaban  5 mg Oral BID   aspirin EC  81 mg Oral Daily   bisoprolol  5 mg Oral Daily   dextromethorphan-guaiFENesin  1 tablet Oral BID   doxycycline  100 mg Oral Q12H   furosemide  40 mg Intravenous BID   insulin aspart  0-5 Units Subcutaneous QHS   insulin aspart  0-9 Units Subcutaneous TID WC   insulin glargine-yfgn  10 Units Subcutaneous Daily    ipratropium-albuterol  3 mL Nebulization BID   isosorbide mononitrate  10 mg Oral BID   losartan  100 mg Oral Daily   methylPREDNISolone (SOLU-MEDROL) injection  40 mg Intravenous Q12H   nicotine  21 mg Transdermal Q24H   pantoprazole  40 mg Oral Daily   rosuvastatin  20 mg Oral Daily   sodium chloride flush  3 mL Intravenous Q12H   sodium chloride flush  3 mL Intravenous Q12H   traZODone  50 mg Oral QHS   Continuous Infusions:  sodium chloride     PRN Meds:.sodium chloride, acetaminophen **OR** acetaminophen, albuterol, ALPRAZolam, bisacodyl, nitroGLYCERIN, ondansetron **OR** ondansetron (ZOFRAN) IV, polyethylene glycol, sodium chloride flush   Anti-infectives (From admission, onward)    Start     Dose/Rate Route Frequency Ordered Stop   12/06/21 2200  doxycycline (VIBRA-TABS) tablet 100 mg        100 mg Oral Every 12 hours 12/06/21 1637     12/06/21 1315  cefTRIAXone (ROCEPHIN) 2 g in sodium chloride 0.9 % 100 mL IVPB        2 g 200 mL/hr over 30 Minutes Intravenous  Once 12/06/21 1301 12/06/21 1508         Subjective: Ronald [name] today has no fevers, no emesis,  No chest pain,   - Oral intake not great -Dyspnea at rest improved -Cough persist but is mostly nonproductive at this time   Objective: Vitals:   12/08/21 0800 12/08/21 0917 12/08/21 0930 12/08/21 1319  BP:  (!) 120/41 (!) 120/41 (!) 107/46  Pulse:   75 63  Resp:   20 20  Temp:   98.7 F (37.1 C) 98.1 F (36.7 C)  TempSrc:   Oral   SpO2:   91% 95%  Weight: 80.3 kg     Height:        Intake/Output Summary (Last 24 hours) at 12/08/2021 1449 Last data filed at 12/08/2021 1300 Gross per 24 hour  Intake 850 ml  Output 1150 ml  Net -300 ml   Filed Weights   12/06/21 1212 12/06/21 1624 12/08/21 0800  Weight: 84 kg 80.8 kg 80.3 kg    Physical Exam  Gen:- Awake Alert, dyspnea on exertion persist HEENT:- [place].AT, No sclera icterus Nose- [place] 4L/min Neck-Supple Neck,No JVD,.  Lungs-improving air  movement, few wheezes  CV- S1, S2 normal, regular  Abd-  +ve B.Sounds, Abd Soft, No tenderness,    Extremity/Skin:- Extremities -bilateral upper extremity edema/lymphedema, intact peripheral pulses , pedal pulses present  Psych-affect is appropriate, oriented x3 Neuro-generalized weakness, no new focal deficits, no tremors  Data Reviewed:  I have personally reviewed following labs and imaging studies  CBC: Recent Labs  Lab 12/06/21 1310 12/07/21 0627  WBC 12.1* 8.1  NEUTROABS 11.0*  --   HGB 12.4* 11.2*  HCT 39.2 35.0*  MCV 82.0 82.0  PLT 336 038   Basic Metabolic Panel: Recent Labs  Lab 12/06/21 1310 12/07/21 0627  NA 135 136  K 3.6 3.4*  CL 96* 95*  CO2 31 34*  GLUCOSE 248* 271*  BUN 23 26*  CREATININE 1.73* 1.61*  CALCIUM 8.3* 8.3*   GFR: Estimated Creatinine Clearance: 40.9 mL/min (A) (by C-G formula based on SCr of 1.61 mg/dL (H)). Liver Function Tests: Recent Labs  Lab 12/06/21 1310  AST 15  ALT 14  ALKPHOS 83  BILITOT 0.7  PROT 6.8  ALBUMIN 3.2*   Cardiac Enzymes: No results for input(s): "CKTOTAL", "CKMB", "CKMBINDEX", "TROPONINI" in the last 168 hours. BNP (last 3 results) No results for input(s): "PROBNP" in the last 8760 hours. HbA1C: No results for input(s): "HGBA1C" in the last 72 hours. Sepsis Labs: '@LABRCNTIP'$ (procalcitonin:4,lacticidven:4) ) Recent Results (from the past 240 hour(s))  Blood culture (routine x 2)     Status: None (Preliminary result)   Collection Time: 12/06/21  1:13 PM   Specimen: Left Antecubital; Blood  Result Value Ref Range Status   Specimen Description LEFT ANTECUBITAL  Final   Special Requests   Final    BOTTLES DRAWN AEROBIC AND ANAEROBIC Blood Culture adequate volume   Culture   Final    NO GROWTH 2 DAYS Performed at [facility], [address] [place], [place] [address]    Report Status PENDING  Incomplete  Blood culture (routine x 2)     Status: None (Preliminary result)   Collection Time: 12/06/21  1:14  PM   Specimen: BLOOD LEFT HAND  Result Value Ref Range Status   Specimen Description BLOOD LEFT HAND  Final   Special Requests   Final    BOTTLES DRAWN AEROBIC AND ANAEROBIC Blood Culture adequate volume   Culture   Final    NO GROWTH 2 DAYS Performed at [facility], [address] [place], [place] [address]    Report Status PENDING  Incomplete    Radiology Studies: No results found.  Scheduled Meds:  amiodarone  200 mg Oral Daily   amLODipine  5 mg Oral Daily   apixaban  5 mg Oral BID   aspirin EC  81 mg Oral Daily   bisoprolol  5 mg Oral Daily   dextromethorphan-guaiFENesin  1 tablet Oral BID   doxycycline  100 mg Oral Q12H   furosemide  40 mg Intravenous BID   insulin aspart  0-5 Units Subcutaneous QHS   insulin aspart  0-9 Units Subcutaneous TID WC   insulin glargine-yfgn  10 Units Subcutaneous Daily   ipratropium-albuterol  3 mL Nebulization BID   isosorbide mononitrate  10 mg Oral BID   losartan  100 mg Oral Daily   methylPREDNISolone (SOLU-MEDROL) injection  40 mg Intravenous Q12H   nicotine  21 mg Transdermal Q24H   pantoprazole  40 mg Oral Daily   rosuvastatin  20 mg Oral Daily   sodium chloride flush  3 mL Intravenous Q12H   sodium chloride flush  3 mL Intravenous Q12H   traZODone  50 mg Oral QHS   Continuous Infusions:  sodium chloride       LOS: 1 day   Roxan Hockey M.D on 12/08/2021 at 2:49 PM  Go to [email or web address] - for contact  info  [facility] - Office  [phone]  If 7PM-7AM, please contact night-coverage [email or web address] 12/08/2021, 2:49 PM

## 2021-12-09 ENCOUNTER — Inpatient Hospital Stay (HOSPITAL_COMMUNITY): Payer: Medicare HMO

## 2021-12-09 DIAGNOSIS — I5033 Acute on chronic diastolic (congestive) heart failure: Secondary | ICD-10-CM | POA: Diagnosis not present

## 2021-12-09 LAB — BASIC METABOLIC PANEL
Anion gap: 7 (ref 5–15)
BUN: 45 mg/dL — ABNORMAL HIGH (ref 8–23)
CO2: 33 mmol/L — ABNORMAL HIGH (ref 22–32)
Calcium: 8.5 mg/dL — ABNORMAL LOW (ref 8.9–10.3)
Chloride: 96 mmol/L — ABNORMAL LOW (ref 98–111)
Creatinine, Ser: 1.59 mg/dL — ABNORMAL HIGH (ref 0.61–1.24)
GFR, Estimated: 46 mL/min — ABNORMAL LOW (ref >60)
Glucose, Bld: 302 mg/dL — ABNORMAL HIGH (ref 70–99)
Potassium: 3.8 mmol/L (ref 3.5–5.1)
Sodium: 136 mmol/L (ref 135–145)

## 2021-12-09 LAB — GLUCOSE, CAPILLARY
Glucose-Capillary: 289 mg/dL — ABNORMAL HIGH (ref 70–99)
Glucose-Capillary: 404 mg/dL — ABNORMAL HIGH (ref 70–99)
Glucose-Capillary: 195 mg/dL — ABNORMAL HIGH (ref 70–99)
Glucose-Capillary: 116 mg/dL — ABNORMAL HIGH (ref 70–99)

## 2021-12-09 MED ORDER — INSULIN ASPART 100 UNIT/ML IJ SOLN: 0.0000 [IU] | Freq: Every day | INTRAMUSCULAR | Status: DC

## 2021-12-09 MED ORDER — INSULIN ASPART 100 UNIT/ML IJ SOLN
0.0000 [IU] | Freq: Three times a day (TID) | INTRAMUSCULAR | Status: DC
  Administered 2021-12-09: 4 [IU] via SUBCUTANEOUS
  Administered 2021-12-10: 7 [IU] via SUBCUTANEOUS

## 2021-12-09 MED ORDER — INSULIN ASPART 100 UNIT/ML IJ SOLN
30.0000 [IU] | Freq: Once | INTRAMUSCULAR | Status: AC
  Administered 2021-12-09: 30 [IU] via SUBCUTANEOUS

## 2021-12-09 MED ORDER — INSULIN GLARGINE-YFGN 100 UNIT/ML ~~LOC~~ SOLN
16.0000 [IU] | Freq: Every day | SUBCUTANEOUS | Status: DC
  Administered 2021-12-09 – 2021-12-10 (×2): 16 [IU] via SUBCUTANEOUS
  Filled 2021-12-09 (×3): qty 0.16

## 2021-12-09 NOTE — Care Management Important Message (Signed)
Important Message  Patient Details  Name: Ronald [name] MRN: [id number] Date of Birth: February 14, 1946   Medicare Important Message Given:  Yes     Tommy Medal 12/09/2021, 11:46 AM

## 2021-12-09 NOTE — TOC Initial Note (Signed)
Transition of Care [facility]) - Initial/Assessment Note    Patient Details  Name: Ronald [name] MRN: [id number] Date of Birth: 1946/01/06  Transition of [facility]) CM/SW Contact:    Salome Arnt, LCSW Phone Number: 12/09/2021, 9:14 AM  Clinical Narrative:  Pt admitted with acute on chronic diastolic CHF. Assessment completed due to high risk readmission score. Pt lives with his ex-wife and is fairly independent with ADLs. He has a walker if needed and is on 3L home O2 (unsure on agency). Pt's son provides transportation to appointments. Pt is active with HHRN with [facility]. Sarah with [facility] [facility] notified of admission. Pt plans to return home when medically stable. TOC will continue to follow.                  Expected Discharge Plan: [place] Barriers to Discharge: Continued Medical Work up   Patient Goals and CMS Choice Patient states their goals for this hospitalization and ongoing recovery are:: return home   Choice offered to / list presented to : Patient  Expected Discharge Plan and Services Expected Discharge Plan: [place] In-house Referral: Clinical Social Work     Living arrangements for the past 2 months: Single Family Home                           HH Arranged: RN [place] Agency: Other - See comment Date HH Agency Contacted: 12/09/21 Time HH Agency Contacted: [phone] Representative spoke with at [place]: Billey Chang  Prior Living Arrangements/Services Living arrangements for the past 2 months: [place] Lives with:: Relatives Patient language and need for interpreter reviewed:: Yes Do you feel safe going back to the place where you live?: Yes      Need for Family Participation in Patient Care: No (Comment)   Current home services: Home RN Criminal Activity/Legal Involvement Pertinent to Current Situation/Hospitalization: No - Comment as needed  Activities of Daily Living      Permission Sought/Granted          Permission granted to share info w AGENCY: [facility]  Permission granted to share info w Relationship: Home health     Emotional Assessment     Affect (typically observed): Appropriate Orientation: : Oriented to Self, Oriented to Place, Oriented to  Time, Oriented to Situation Alcohol / Substance Use: Not Applicable Psych Involvement: No (comment)  Admission diagnosis:  COPD exacerbation ([place]) [J44.1] Respiratory failure with hypoxia (HCC) [J96.91] COPD with acute exacerbation ([place]) [J44.1] Patient Active Problem List   Diagnosis Date Noted   COPD with acute exacerbation ([place]) 12/07/2021   Respiratory failure with hypoxia ([place]) 12/06/2021   Prolonged QT interval 11/23/2021   Acute exacerbation of CHF (congestive heart failure) ([place]) 11/23/2021   Acute on chronic diastolic CHF (congestive heart failure) ([place]) 11/22/2021   Non-ST elevation (NSTEMI) myocardial infarction ([place]) 09/01/2021   Respiratory failure ([place]) 08/31/2021   Acute respiratory failure ([place]) 07/10/2021   Sepsis due to pneumonia ([place]) 07/10/2021   Pulmonary nodule 07/10/2021   Malnutrition of moderate degree 05/13/2021   Atrial flutter ([place]) 05/01/2021   Shock circulatory ([place]) 05/01/2021   Acute hypercapnic respiratory failure ([place]) 05/01/2021   Elevated brain natriuretic peptide (BNP) level 12/21/2020   Elevated troponin 12/21/2020   Hypokalemia 12/21/2020   Hyperglycemia due to diabetes mellitus ([place]) 12/21/2020   Mixed hyperlipidemia 12/21/2020   Acute on chronic respiratory failure with hypoxia ([place]) 12/21/2020   Chest  pain 05/23/2020   Controlled substance agreement signed 04/03/2020   Benzodiazepine dependence ([place]) 04/03/2020   Dysphagia 02/27/2020   Tracheal stenosis 12/09/2019   Lymphedema 04/01/2019   CHF (congestive heart failure) ([place]) 08/27/2018   Aortic atherosclerosis ([place]) 05/11/2017   Depression 05/05/2017   GERD (gastroesophageal reflux disease) 01/16/2017   Diabetic neuropathy  ([place]) 01/16/2017   Anterolisthesis 12/04/2016   History of MI (myocardial infarction) 12/04/2016   OSA (obstructive sleep apnea) 12/04/2016   Closed fracture dislocation of lumbar spine ([place]) 07/25/2016   Closed fracture of body of sternum 07/25/2016   Multiple fractures of cervical spine ([place]) 07/25/2016   Anxiety 02/14/2016   Chronic respiratory failure with hypoxia ([place]) 07/19/2014   Essential hypertension 07/19/2014   Cigarette smoker 07/19/2014   Coronary artery disease 06/22/2014   Diabetes mellitus ([place]) 06/22/2014   COPD exacerbation ([place]) 06/22/2014   Tobacco abuse 06/22/2014   Acute encephalopathy 06/22/2014   Diabetes mellitus type 2 in obese ([place]) 05/11/2014   Simple chronic bronchitis ([place]) 05/11/2014   PCP:  [name], FNP Pharmacy:   [facility] [address] [place] [address] [place] [address] Phone: [phone] Fax: [phone]     Social Determinants of Health (SDOH) Interventions    Readmission Risk Interventions    12/09/2021    8:42 AM 11/25/2021    1:34 PM 09/04/2021    1:32 PM  Readmission Risk Prevention Plan  Transportation Screening Complete Complete Complete  Medication Review (RN Care Manager) Complete Complete Complete  PCP or Specialist appointment within 3-5 days of discharge  Not Complete Complete  HRI or Home Care Consult Complete Complete Complete  SW Recovery Care/Counseling Consult Complete Complete Complete  Palliative Care Screening Not Applicable Not Applicable Not Applicable  Comments   N/A  [place] Not Applicable Not Applicable Not Applicable

## 2021-12-09 NOTE — Progress Notes (Signed)
PROGRESS NOTE     Ronald [name], is a 76 y.o. male, DOB - [birth year], FXT:[id number]  Admit date - 12/06/2021   Admitting Physician Dayna Geurts Denton Brick, MD  Outpatient Primary MD for the patient is [name], FNP  LOS - 2  Chief Complaint  Patient presents with   Shortness of Breath        Brief Narrative:  76 y.o. male with medical history significant of chronic respiratory failure on 3 LPM of oxygen, COPD, HFpEF, hypertension, hyperlipidemia, GERD, CAD s/p stent placement admitted on 12/06/2021 with worsening respiratory status with concerns for acute COPD exacerbation and acute CHF exacerbation with flat troponins  A/p 1)Acute on chronic diastolic CHF exacerbation -BNP this admission is 212 up from 130 couple weeks PTA -Chest x-ray with pulmonary edema on admission  -EKG with sinus rhythm, LVH and abnormal report -Dyspnea persist, hypoxia persist -Monitor daily weights and fluid output and input From -Repeat chest x-ray on 12/09/2021 largely unchanged -Continue IV Lasix   2)AKI----acute kidney injury  Vs CKD 3A -Creatinine on admission was 1.73 which is close to baseline - renally adjust medications, avoid nephrotoxic agents / dehydration  / hypotension   3)Acute on chronic hypoxic respiratory failure secondary to COPD exacerbation and compounded by CHF exacerbation --ABG with pH of 7.34 PCO2 of 68, PO2 69, O2 sats 94% -Patient has home O2 but does not really use it much because he smokes 1-1/2 to 2 packs daily -Recent VQ scan without evidence of PE -Renal function precludes CTA chest study -Patient insist he is compliant with Eliquis history of PE less likely -Cough , hypoxia and dyspnea persist   4)History of CAD with prior angioplasty/stents--- please see #1 above -Presentation not consistent with ACS -Remains chest pain-free -Continue aspirin and Crestor   5)HTN--- continue amlodipine, bisoprolol, losartan and isosorbide    6) chronic A-fib--- continue Eliquis  for stroke prophylaxis and amiodarone for rate control, LFTs are not elevated   7) chronic anemia-- Hgb stable, close to recent baseline -Monitor closely, no acute bleeding concerns   8) COPD--- mild exacerbation at this time, doxycycline as ordered, continue Solu-Medrol and bronchodilators as ordered -Dyspnea and oxygen requirement improving   9) hypokalemia--- due to Lasix/diuresis, replace and recheck  10)Social/Ethics-he is a full code, -Patient lives with his ex-wife, his son is his primary contact  -Discussed with patient's son and pt's neice, questions answered -They are concerned that patient is not compliant with home O2 or medications -We will resume home health RN for cardiopulmonary assessment for medication compliance                                                                    Code Status :  -  Code Status: Full Code    Family Communication:    -Patient lives with his ex-wife, his son is his primary contact however -Spoke with  patient's niece [name]   Dispo: The patient is from: Home              Anticipated d/c is to: Home              Anticipated d/c date is: 1 day              Patient currently  is not medically stable to d/c. Barriers: Not Clinically Stable-   DVT Prophylaxis  :   - SCDs   SCDs Start: 12/06/21 1638 Place TED hose Start: 12/06/21 1638 apixaban (ELIQUIS) tablet 5 mg   Lab Results  Component Value Date   PLT 274 12/07/2021    Inpatient Medications  Scheduled Meds:  amiodarone  200 mg Oral Daily   amLODipine  5 mg Oral Daily   apixaban  5 mg Oral BID   aspirin EC  81 mg Oral Daily   bisoprolol  5 mg Oral Daily   dextromethorphan-guaiFENesin  1 tablet Oral BID   doxycycline  100 mg Oral Q12H   furosemide  40 mg Intravenous BID   insulin aspart  0-20 Units Subcutaneous TID WC   insulin aspart  0-5 Units Subcutaneous QHS   insulin glargine-yfgn  16 Units Subcutaneous Daily   ipratropium-albuterol  3 mL Nebulization BID   isosorbide  mononitrate  10 mg Oral BID   losartan  100 mg Oral Daily   methylPREDNISolone (SOLU-MEDROL) injection  40 mg Intravenous Q12H   nicotine  21 mg Transdermal Q24H   pantoprazole  40 mg Oral Daily   rosuvastatin  20 mg Oral Daily   sodium chloride flush  3 mL Intravenous Q12H   sodium chloride flush  3 mL Intravenous Q12H   traZODone  50 mg Oral QHS   Continuous Infusions:  sodium chloride     PRN Meds:.sodium chloride, acetaminophen **OR** acetaminophen, albuterol, ALPRAZolam, bisacodyl, nitroGLYCERIN, ondansetron **OR** ondansetron (ZOFRAN) IV, polyethylene glycol, sodium chloride flush   Anti-infectives (From admission, onward)    Start     Dose/Rate Route Frequency Ordered Stop   12/06/21 2200  doxycycline (VIBRA-TABS) tablet 100 mg        100 mg Oral Every 12 hours 12/06/21 1637     12/06/21 1315  cefTRIAXone (ROCEPHIN) 2 g in sodium chloride 0.9 % 100 mL IVPB        2 g 200 mL/hr over 30 Minutes Intravenous  Once 12/06/21 1301 12/06/21 1508         Subjective: Ronald [name] today has no fevers, no emesis,  No chest pain,   - Continues to have cough dyspnea and wheezing -Very reluctant to get up and get around -   Objective: Vitals:   12/08/21 2110 12/09/21 0522 12/09/21 0736 12/09/21 1316  BP: (!) 151/45 (!) 134/53  (!) 127/43  Pulse: (!) 58 (!) 58  (!) 58  Resp: (!) '23 18  19  '$ Temp: 97.6 F (36.4 C) 97.7 F (36.5 C)  98.1 F (36.7 C)  TempSrc: Oral Oral    SpO2: 94% 98% 99% 100%  Weight:      Height:        Intake/Output Summary (Last 24 hours) at 12/09/2021 2017 Last data filed at 12/09/2021 1941 Gross per 24 hour  Intake 963 ml  Output 2050 ml  Net -1087 ml   Filed Weights   12/06/21 1212 12/06/21 1624 12/08/21 0800  Weight: 84 kg 80.8 kg 80.3 kg    Physical Exam  Gen:- Awake Alert, dyspnea on exertion persist HEENT:- [place].AT, No sclera icterus Nose- [place] 4L/min Neck-Supple Neck,No JVD,.  Lungs-improving air movement, few wheezes  CV- S1, S2  normal, regular  Abd-  +ve B.Sounds, Abd Soft, No tenderness,    Extremity/Skin:- Extremities -bilateral upper extremity edema/lymphedema, intact peripheral pulses , pedal pulses present  Psych-affect is appropriate, oriented x3 Neuro-generalized weakness, no new focal deficits, no tremors  Data Reviewed: I have personally reviewed following labs and imaging studies  CBC: Recent Labs  Lab 12/06/21 1310 12/07/21 0627  WBC 12.1* 8.1  NEUTROABS 11.0*  --   HGB 12.4* 11.2*  HCT 39.2 35.0*  MCV 82.0 82.0  PLT 336 539   Basic Metabolic Panel: Recent Labs  Lab 12/06/21 1310 12/07/21 0627 12/09/21 0617  NA 135 136 136  K 3.6 3.4* 3.8  CL 96* 95* 96*  CO2 31 34* 33*  GLUCOSE 248* 271* 302*  BUN 23 26* 45*  CREATININE 1.73* 1.61* 1.59*  CALCIUM 8.3* 8.3* 8.5*   GFR: Estimated Creatinine Clearance: 41.4 mL/min (A) (by C-G formula based on SCr of 1.59 mg/dL (H)). Liver Function Tests: Recent Labs  Lab 12/06/21 1310  AST 15  ALT 14  ALKPHOS 83  BILITOT 0.7  PROT 6.8  ALBUMIN 3.2*   Cardiac Enzymes: No results for input(s): "CKTOTAL", "CKMB", "CKMBINDEX", "TROPONINI" in the last 168 hours. BNP (last 3 results) No results for input(s): "PROBNP" in the last 8760 hours. HbA1C: No results for input(s): "HGBA1C" in the last 72 hours. Sepsis Labs: '@LABRCNTIP'$ (procalcitonin:4,lacticidven:4) ) Recent Results (from the past 240 hour(s))  Blood culture (routine x 2)     Status: None (Preliminary result)   Collection Time: 12/06/21  1:13 PM   Specimen: Left Antecubital; Blood  Result Value Ref Range Status   Specimen Description LEFT ANTECUBITAL  Final   Special Requests   Final    BOTTLES DRAWN AEROBIC AND ANAEROBIC Blood Culture adequate volume   Culture   Final    NO GROWTH 3 DAYS Performed at [facility], [address], [place], [place] [address]    Report Status PENDING  Incomplete  Blood culture (routine x 2)     Status: None (Preliminary result)   Collection Time:  12/06/21  1:14 PM   Specimen: BLOOD LEFT HAND  Result Value Ref Range Status   Specimen Description BLOOD LEFT HAND  Final   Special Requests   Final    BOTTLES DRAWN AEROBIC AND ANAEROBIC Blood Culture adequate volume   Culture   Final    NO GROWTH 3 DAYS Performed at [facility], [address], [place], [place] [address]    Report Status PENDING  Incomplete    Radiology Studies: DG Chest 2 View  Result Date: 12/09/2021 CLINICAL DATA:  Short of breath EXAM: CHEST - 2 VIEW COMPARISON:  12/06/2021 FINDINGS: Background emphysema. Similar lung aeration with some patchy increased density superimposed on chronic changes. No pleural effusion. No pneumothorax. Stable cardiomediastinal contours. No acute osseous abnormality. IMPRESSION: Lung aeration is similar with patchy atelectasis/consolidation superimposed on chronic changes. Electronically Signed   By: Macy Mis M.D.   On: 12/09/2021 08:36    Scheduled Meds:  amiodarone  200 mg Oral Daily   amLODipine  5 mg Oral Daily   apixaban  5 mg Oral BID   aspirin EC  81 mg Oral Daily   bisoprolol  5 mg Oral Daily   dextromethorphan-guaiFENesin  1 tablet Oral BID   doxycycline  100 mg Oral Q12H   furosemide  40 mg Intravenous BID   insulin aspart  0-20 Units Subcutaneous TID WC   insulin aspart  0-5 Units Subcutaneous QHS   insulin glargine-yfgn  16 Units Subcutaneous Daily   ipratropium-albuterol  3 mL Nebulization BID   isosorbide mononitrate  10 mg Oral BID   losartan  100 mg Oral Daily   methylPREDNISolone (SOLU-MEDROL) injection  40 mg Intravenous  Q12H   nicotine  21 mg Transdermal Q24H   pantoprazole  40 mg Oral Daily   rosuvastatin  20 mg Oral Daily   sodium chloride flush  3 mL Intravenous Q12H   sodium chloride flush  3 mL Intravenous Q12H   traZODone  50 mg Oral QHS   Continuous Infusions:  sodium chloride       LOS: 2 days   Roxan Hockey M.D on 12/09/2021 at 8:17 PM  Go to [email or web address] - for contact info  [facility] - Office  [phone]  If 7PM-7AM, please contact night-coverage [email or web address] 12/09/2021, 8:17 PM

## 2021-12-09 NOTE — Inpatient Diabetes Management (Signed)
Inpatient Diabetes Program Recommendations  AACE/ADA: New Consensus Statement on Inpatient Glycemic Control (2015)  Target Ranges:  Prepandial:   less than 140 mg/dL      Peak postprandial:   less than 180 mg/dL (1-2 hours)      Critically ill patients:  140 - 180 mg/dL   Lab Results  Component Value Date   GLUCAP 289 (H) 12/09/2021   HGBA1C 5.8 (H) 08/14/2021    Review of Glycemic Control  Latest Reference Range & Units 12/08/21 07:12 12/08/21 11:10 12/08/21 16:26 12/08/21 21:31 12/09/21 07:11  Glucose-Capillary 70 - 99 mg/dL 287 (H) 357 (H) 268 (H) 328 (H) 289 (H)   Diabetes history: none, on steroids leading to hyperglycemia Current orders for Inpatient glycemic control:  Semglee 16 units Daily Novolog 0-20 units tid + hs  Solumedrol 40 mg q12 hours  Inpatient Diabetes Program Recommendations:    Trends increase after meal intake  -  Pt may benefit from Novolog 4 units tid meal coverage if eating >50% of meals.  Thanks,  Tama Headings RN, MSN, BC-ADM Inpatient Diabetes Coordinator Team Pager [phone] (8a-5p)

## 2021-12-10 ENCOUNTER — Other Ambulatory Visit: Payer: Self-pay | Admitting: Family

## 2021-12-10 DIAGNOSIS — I5033 Acute on chronic diastolic (congestive) heart failure: Secondary | ICD-10-CM | POA: Diagnosis not present

## 2021-12-10 LAB — GLUCOSE, CAPILLARY
Glucose-Capillary: 231 mg/dL — ABNORMAL HIGH (ref 70–99)
Glucose-Capillary: 405 mg/dL — ABNORMAL HIGH (ref 70–99)

## 2021-12-10 MED ORDER — INSULIN GLARGINE-YFGN 100 UNIT/ML ~~LOC~~ SOLN
10.0000 [IU] | Freq: Once | SUBCUTANEOUS | Status: AC
  Administered 2021-12-10: 10 [IU] via SUBCUTANEOUS
  Filled 2021-12-10: qty 0.1

## 2021-12-10 MED ORDER — PREDNISONE 20 MG PO TABS: 40.0000 mg | ORAL_TABLET | Freq: Every day | ORAL | 0 refills | Status: AC

## 2021-12-10 MED ORDER — BUDESONIDE 0.5 MG/2ML IN SUSP: 0.5000 mg | Freq: Two times a day (BID) | RESPIRATORY_TRACT | 12 refills | Status: DC

## 2021-12-10 MED ORDER — ACETAMINOPHEN 325 MG PO TABS: 650.0000 mg | ORAL_TABLET | Freq: Four times a day (QID) | ORAL | 0 refills | Status: DC | PRN

## 2021-12-10 MED ORDER — ALBUTEROL SULFATE HFA 108 (90 BASE) MCG/ACT IN AERS: 2.0000 | INHALATION_SPRAY | Freq: Four times a day (QID) | RESPIRATORY_TRACT | 2 refills | Status: DC | PRN

## 2021-12-10 MED ORDER — NICOTINE 21 MG/24HR TD PT24: 21.0000 mg | MEDICATED_PATCH | TRANSDERMAL | 0 refills | Status: AC

## 2021-12-10 MED ORDER — DM-GUAIFENESIN ER 30-600 MG PO TB12: 1.0000 | ORAL_TABLET | Freq: Two times a day (BID) | ORAL | 0 refills | Status: DC

## 2021-12-10 MED ORDER — IPRATROPIUM-ALBUTEROL 0.5-2.5 (3) MG/3ML IN SOLN: 3.0000 mL | Freq: Four times a day (QID) | RESPIRATORY_TRACT | 3 refills | Status: DC | PRN

## 2021-12-10 MED ORDER — INSULIN ASPART 100 UNIT/ML IJ SOLN
24.0000 [IU] | Freq: Once | INTRAMUSCULAR | Status: AC
Start: 2021-12-10 — End: 2021-12-10
  Administered 2021-12-10: 24 [IU] via SUBCUTANEOUS

## 2021-12-10 MED ORDER — POLYETHYLENE GLYCOL 3350 17 G PO PACK: 17.0000 g | PACK | Freq: Every day | ORAL | 2 refills | Status: DC

## 2021-12-10 MED ORDER — FUROSEMIDE 20 MG PO TABS: 20.0000 mg | ORAL_TABLET | ORAL | 2 refills | Status: DC

## 2021-12-10 MED ORDER — DOXYCYCLINE HYCLATE 100 MG PO TABS: 100.0000 mg | ORAL_TABLET | Freq: Two times a day (BID) | ORAL | 0 refills | Status: AC

## 2021-12-10 MED ORDER — GLIPIZIDE 5 MG PO TABS: 5.0000 mg | ORAL_TABLET | Freq: Every day | ORAL | 0 refills | Status: DC

## 2021-12-10 NOTE — Discharge Instructions (Signed)
1)Complete Abstinence from Tobacco advised--- you may use Nicotine patch to help you quit smoking 2)you need oxygen at home at 3 L via nasal cannula continuously while awake and while asleep--- smoking or having open fires around oxygen can cause fire, significant injury and death 3)Very low-salt diet advised 4)Weigh yourself daily, call if you gain more than 3 pounds in 1 day or more than 5 pounds in 1 week as your diuretic medications may need to be adjusted 5) please note that there has been several changes to your medications 6)Avoid ibuprofen/Advil/Aleve/Motrin/Goody Powders/Naproxen/BC powders/Meloxicam/Diclofenac/Indomethacin and other Nonsteroidal anti-inflammatory medications as these will make you more likely to bleed and can cause stomach ulcers, can also cause Kidney problems.  7) follow-up with your PCP within the week for repeat CBC and BMP blood tests and reevaluation

## 2021-12-10 NOTE — TOC Transition Note (Addendum)
Transition of Care [facility]) - CM/SW Discharge Note   Patient Details  Name: Ronald [name] MRN: [id number] Date of Birth: [birth year]  [facility] of [facility]) CM/SW Contact:  Salome Arnt, LCSW Phone Number: 12/10/2021, 11:27 AM   Clinical Narrative: Pt d/c today. Sarah with [facility] notified. Home health orders are in. Pt states his son can pick him up today. RN updated.       Final next level of care: [place] Barriers to Discharge: Barriers Resolved   Patient Goals and CMS Choice Patient states their goals for this hospitalization and ongoing recovery are:: return home   Choice offered to / list presented to : Patient  Discharge Placement                    Patient and family notified of of transfer: 12/10/21  Discharge Plan and Services In-house Referral: Clinical Social Work                        HH Arranged: RN [place] Agency: Other - See comment Date HH Agency Contacted: 12/09/21 Time [place]: 0913 Representative spoke with at [place]: [place] Determinants of Health ([place]) Interventions     Readmission Risk Interventions    12/09/2021    8:42 AM 11/25/2021    1:34 PM 09/04/2021    1:32 PM  Readmission Risk Prevention Plan  Transportation Screening Complete Complete Complete  Medication Review Press photographer) Complete Complete Complete  PCP or Specialist appointment within 3-5 days of discharge  Not Complete Complete  HRI or Home Care Consult Complete Complete Complete  SW Recovery Care/Counseling Consult Complete Complete Complete  Palliative Care Screening Not Applicable Not Applicable Not Applicable  Comments   N/A  [place] Not Applicable Not Applicable Not Applicable

## 2021-12-10 NOTE — Progress Notes (Signed)
Discharge instructions read to patient He verbalized understanding of all information.  Discharged to home with family 

## 2021-12-10 NOTE — Discharge Summary (Signed)
Ronald [name], is a 76 y.o. male  DOB 1946/01/17  MRN [id number].  Admission date:  12/06/2021  Admitting Physician  Roxan Hockey, MD  Discharge Date:  12/10/2021   Primary MD  [name], FNP  Recommendations for primary care physician for things to follow:   1)Complete Abstinence from Tobacco advised--- you may use Nicotine patch to help you quit smoking 2)you need oxygen at home at 3 L via nasal cannula continuously while awake and while asleep--- smoking or having open fires around oxygen can cause fire, significant injury and death 3)Very low-salt diet advised 4)Weigh yourself daily, call if you gain more than 3 pounds in 1 day or more than 5 pounds in 1 week as your diuretic medications may need to be adjusted 5) please note that there has been several changes to your medications 6)Avoid ibuprofen/Advil/Aleve/Motrin/Goody Powders/Naproxen/BC powders/Meloxicam/Diclofenac/Indomethacin and other Nonsteroidal anti-inflammatory medications as these will make you more likely to bleed and can cause stomach ulcers, can also cause Kidney problems.  7) follow-up with your PCP within the week for repeat CBC and BMP blood tests and reevaluation  Admission Diagnosis  COPD exacerbation (Ronald [name]) [J44.1] Respiratory failure with hypoxia (Ronald [name]) [J96.91] COPD with acute exacerbation (Ronald [name]) [J44.1]   Discharge Diagnosis  COPD exacerbation (Ronald [name]) [J44.1] Respiratory failure with hypoxia (Ronald [name]) [J96.91] COPD with acute exacerbation (Ronald [name]) [J44.1]    Principal Problem:   Acute on chronic diastolic CHF (congestive heart failure) (Ronald [name]) Active Problems:   Respiratory failure with hypoxia (Ronald [name])   Cigarette smoker   COPD with acute exacerbation (Ronald [name])      Past Medical History:  Diagnosis Date   Anxiety    Asthma    Atrial flutter (Ronald [name]) 04/2021   Chronic diastolic CHF (congestive heart failure) (Ronald [name])    Chronic lower  back pain    COPD (chronic obstructive pulmonary disease) (Ronald [name])    Coronary artery disease    a. NSTEMI 05/2014 s/p DESx2 to LAD at [facility]   Depression    GERD (gastroesophageal reflux disease)    High cholesterol    History of tracheostomy    Hypertension    NSTEMI (non-ST elevated myocardial infarction) (Ronald [name]) 05/2014   with stent placement   PAF (paroxysmal atrial fibrillation) (Ronald [name])    Sleep apnea    Stroke (Ronald [name]) 2017   anyeusym    TIA (transient ischemic attack)    "they say I've had some mini strokes; don't know when"; denies residual on 06/22/2014)   Tobacco abuse    Type II diabetes mellitus (Ronald [name])    Ulcerative colitis (Ronald [name])     Past Surgical History:  Procedure Laterality Date   APPENDECTOMY     BIOPSY  07/20/2020   Procedure: BIOPSY;  Surgeon: Harvel Quale, MD;  [facility]: [facility];  Service: Gastroenterology;;   CARDIAC CATHETERIZATION  [phone] X 3   CHOLECYSTECTOMY     COLONOSCOPY WITH PROPOFOL N/A 07/20/2020   Procedure: COLONOSCOPY WITH PROPOFOL;  Surgeon: Harvel Quale, MD;  Location: [facility];  Service:  Gastroenterology;  Laterality: N/A;  1:15   CORONARY ANGIOPLASTY WITH STENT PLACEMENT  05/2014   "2"   ESOPHAGEAL DILATION N/A 07/20/2020   Procedure: ESOPHAGEAL DILATION;  Surgeon: Harvel Quale, MD;  [facility];  Service: Gastroenterology;  Laterality: N/A;   ESOPHAGOGASTRODUODENOSCOPY (EGD) WITH PROPOFOL N/A 07/20/2020   Procedure: ESOPHAGOGASTRODUODENOSCOPY (EGD) WITH PROPOFOL;  Surgeon: Harvel Quale, MD;  [facility];  Service: Gastroenterology;  Laterality: N/A;   IR GASTROSTOMY TUBE MOD SED  06/04/2021   IR GASTROSTOMY TUBE REMOVAL  07/24/2021   LEFT HEART CATH AND CORONARY ANGIOGRAPHY N/A 05/25/2020   Procedure: LEFT HEART CATH AND CORONARY ANGIOGRAPHY;  Surgeon: Martinique, Peter M, MD;  Location: [place];  Service: Cardiovascular;  Laterality: N/A;   POLYPECTOMY   07/20/2020   Procedure: POLYPECTOMY INTESTINAL;  Surgeon: Montez Morita, Quillian Quince, MD;  Location: [facility];  Service: Gastroenterology;;   TUMOR EXCISION Right ~ 1999   "side of my upper head"       HPI  from the history and physical done on the day of admission:    Ronald [name]  is a 76 y.o. male  with medical history significant of chronic respiratory failure on 3 LPM of oxygen, COPD, HFpEF, hypertension, hyperlipidemia, GERD, CAD s/p stent placement, ongoing tobacco abuse, noncompliance with home oxygen recently discharged from the hospital after treatment for CHF exacerbation who presents to the ED with worsening shortness of breath, -In the ED patient was found to be tachypneic with increased work of breathing and hypoxia -No fever  Or chills    No Nausea, Vomiting or Diarrhea At time of my evaluation patient denies any chest pains palpitations or dizziness no leg pains leg swelling or pleuritic symptoms -Additional history obtained from patient's niece [name] at bedside BNP today is 212 up from 130 couple weeks ago -Lactic acid is not elevated -Creatinine 1.73 which is close to baseline -WBC 12.1, Hgb 12.4 -ABG with pH of 7.34 PCO2 of 68, PO2 69, O2 sats 94% -Chest x-ray with pulmonary edema    Hospital Course:    Assessment and Plan: Brief Narrative:  76 y.o. male with medical history significant of chronic respiratory failure on 3 LPM of oxygen, COPD, HFpEF, hypertension, hyperlipidemia, GERD, CAD s/p stent placement admitted on 12/06/2021 with worsening respiratory status with concerns for acute COPD exacerbation and acute CHF exacerbation with flat troponins   A/p 1)Acute on chronic diastolic CHF exacerbation -BNP this admission is 212 up from 130 couple weeks PTA -Chest x-ray with pulmonary edema on admission  -EKG with sinus rhythm, LVH and abnormal report -Clinically much improved with iv Lasix -Dyspnea and Hypoxia improved significantly  -ok to dc on po  lasix  2)AKI----acute kidney injury  Vs CKD 3A -Creatinine on admission was 1.73 which is close to baseline Creatinine is currently 1.59 - renally adjust medications, avoid nephrotoxic agents / dehydration  / hypotension   3)Acute on chronic hypoxic respiratory failure secondary to COPD exacerbation and compounded by CHF exacerbation --ABG with pH of 7.34 PCO2 of 68, PO2 69, O2 sats 94% -Patient has home O2 but does not really use it much because he smokes 1-1/2 to 2 packs daily -Recent VQ scan without evidence of PE -Renal function precludes CTA chest study -Patient insist he is compliant with Eliquis history of PE less likely -Cough , hypoxia and dyspnea improved overall   4)History of CAD with prior angioplasty/stents--- please see #1 above -Presentation not consistent with ACS -  Remains chest pain-free -Continue Aspirin and Crestor   5)HTN--- continue amlodipine, bisoprolol, losartan and isosorbide    6) chronic A-fib--- continue Eliquis for stroke prophylaxis and amiodarone for rate control, LFTs are not elevated   7) chronic anemia-- Hgb stable, close to recent baseline -Monitor closely, no acute bleeding concerns   8) COPD--- mild exacerbation at this time, doxycycline as ordered, treated with Solu-Medrol and bronchodilators  -ok to dc home on prednisone and bronchiodilators   9)Hypokalemia--- due to Lasix/diuresis, replaced and Normalized   10)Social/Ethics-he is a full code, -Patient lives with his ex-wife, his son is his primary contact  -Discussed with patient's son and pt's neice, questions answered -They are concerned that patient is not compliant with home O2 or medications -We will resume home health RN for cardiopulmonary assessment for medication compliance                                                                    Code Status :  -  Code Status: Full Code    Family Communication:    -Patient lives with his ex-wife, his son is his primary contact  however -Spoke with  patient's niece [name]   Dispo: The patient is from: Home              Anticipated d/c is to: Home  Discharge Condition: stable  Follow UP----with PCP  Diet and Activity recommendation:  As advised  Discharge Instructions    Discharge Instructions     Call MD for:  difficulty breathing, headache or visual disturbances   Complete by: As directed    Call MD for:  persistant dizziness or light-headedness   Complete by: As directed    Call MD for:  persistant nausea and vomiting   Complete by: As directed    Call MD for:  temperature >100.4   Complete by: As directed    Diet - low sodium heart healthy   Complete by: As directed    Discharge instructions   Complete by: As directed    1)Complete Abstinence from Tobacco advised--- you may use Nicotine patch to help you quit smoking 2)you need oxygen at home at 3 L via nasal cannula continuously while awake and while asleep--- smoking or having open fires around oxygen can cause fire, significant injury and death 3)Very low-salt diet advised 4)Weigh yourself daily, call if you gain more than 3 pounds in 1 day or more than 5 pounds in 1 week as your diuretic medications may need to be adjusted 5) please note that there has been several changes to your medications 6)Avoid ibuprofen/Advil/Aleve/Motrin/Goody Powders/Naproxen/BC powders/Meloxicam/Diclofenac/Indomethacin and other Nonsteroidal anti-inflammatory medications as these will make you more likely to bleed and can cause stomach ulcers, can also cause Kidney problems.  7) follow-up with your PCP within the week for repeat CBC and BMP blood tests and reevaluation   Increase activity slowly   Complete by: As directed         Discharge Medications     Allergies as of 12/10/2021       Reactions   Gabapentin Anxiety   Unknown reaction   Metformin And Related Rash        Medication List     TAKE these medications  acetaminophen 325 MG  tablet Commonly known as: TYLENOL Take 2 tablets (650 mg total) by mouth every 6 (six) hours as needed for mild pain (or Fever >/= 101).   albuterol 108 (90 Base) MCG/ACT inhaler Commonly known as: VENTOLIN HFA Inhale 2 puffs into the lungs every 6 (six) hours as needed for wheezing or shortness of breath.   ALPRAZolam 0.5 MG tablet Commonly known as: XANAX Take 1 tablet (0.5 mg total) by mouth 2 (two) times daily as needed. for anxiety What changed: reasons to take this   amiodarone 200 MG tablet Commonly known as: PACERONE Take 1 tablet by mouth once daily   amLODipine 5 MG tablet Commonly known as: NORVASC Take 1 tablet (5 mg total) by mouth daily.   aspirin EC 81 MG tablet Take 1 tablet (81 mg total) by mouth daily. Swallow whole.   bisoprolol 5 MG tablet Commonly known as: ZEBETA Take 1 tablet (5 mg total) by mouth daily. In Place of Metoprolol   budesonide 0.5 MG/2ML nebulizer solution Commonly known as: PULMICORT Take 2 mLs (0.5 mg total) by nebulization 2 (two) times daily.   dextromethorphan-guaiFENesin 30-600 MG 12hr tablet Commonly known as: MUCINEX DM Take 1 tablet by mouth 2 (two) times daily.   doxycycline 100 MG tablet Commonly known as: VIBRA-TABS Take 1 tablet (100 mg total) by mouth 2 (two) times daily for 5 days.   Eliquis 5 MG Tabs tablet Generic drug: apixaban Take 1 tablet (5 mg total) by mouth 2 (two) times daily.   furosemide 20 MG tablet Commonly known as: Lasix Take 1 tablet (20 mg total) by mouth See admin instructions. Take 2 tablets (40 mg) every morning and 1 Tab (20 mg) every evening What changed:  medication strength how much to take when to take this additional instructions   glipiZIDE 5 MG tablet Commonly known as: GLUCOTROL Take 1 tablet (5 mg total) by mouth daily before breakfast for 5 days. Take This Only while Taking Prednisone   glucose blood test strip Commonly known as: ONE TOUCH ULTRA TEST USE TO CHECK GLUCOSE ONCE  DAILY   ipratropium-albuterol 0.5-2.5 (3) MG/3ML Soln Commonly known as: DUONEB Take 3 mLs by nebulization every 6 (six) hours as needed (asthma).   isosorbide mononitrate 10 MG tablet Commonly known as: ISMO Take 1 tablet by mouth twice daily   losartan 100 MG tablet Commonly known as: COZAAR Take 1 tablet (100 mg total) by mouth daily.   nicotine 21 mg/24hr patch Commonly known as: NICODERM CQ - dosed in mg/24 hours Place 1 patch (21 mg total) onto the skin daily for 28 days.   nitroGLYCERIN 0.4 MG SL tablet Commonly known as: NITROSTAT Place 1 tablet (0.4 mg total) under the tongue every 5 (five) minutes as needed for chest pain.   omeprazole 20 MG capsule Commonly known as: PRILOSEC Take 1 capsule (20 mg total) by mouth daily.   OXYGEN Inhale 3 L into the lungs at bedtime as needed (shortness of breath).   polyethylene glycol 17 g packet Commonly known as: MIRALAX / GLYCOLAX Take 17 g by mouth daily.   predniSONE 20 MG tablet Commonly known as: DELTASONE Take 2 tablets (40 mg total) by mouth daily with breakfast for 5 days.   rosuvastatin 20 MG tablet Commonly known as: CRESTOR Take 1 tablet (20 mg total) by mouth daily.       Major procedures and Radiology Reports - PLEASE review detailed and final reports for all details, in brief -  DG Chest 2 View  Result Date: 12/09/2021 CLINICAL DATA:  Short of breath EXAM: CHEST - 2 VIEW COMPARISON:  12/06/2021 FINDINGS: Background emphysema. Similar lung aeration with some patchy increased density superimposed on chronic changes. No pleural effusion. No pneumothorax. Stable cardiomediastinal contours. No acute osseous abnormality. IMPRESSION: Lung aeration is similar with patchy atelectasis/consolidation superimposed on chronic changes. Electronically Signed   By: Macy Mis M.D.   On: 12/09/2021 08:36   DG Chest Port 1 View  Result Date: 12/06/2021 CLINICAL DATA:  PT complaining of SOB since yesterday. Everyday  smoker HX. EXAM: PORTABLE CHEST - 1 VIEW COMPARISON:  11/25/2021 FINDINGS: Coarse perihilar interstitial markings as before. Some increase in ill-defined lower lobe interstitial opacities peripherally. Heart size and mediastinal contours are within normal limits. Aortic Atherosclerosis (ICD10-170.0). No effusion. Visualized bones unremarkable. IMPRESSION: Mild bibasilar interstitial infiltrates or edema, increased since previous Electronically Signed   By: Lucrezia Europe M.D.   On: 12/06/2021 12:43   DG Chest 2 View  Result Date: 11/25/2021 CLINICAL DATA:  Shortness of breath.  History of asthma and COPD. EXAM: CHEST - 2 VIEW COMPARISON:  [facility] chest 11/22/2021; chest two views 09/27/2021; [facility] chest 05/31/2021 FINDINGS: Cardiac silhouette is again mildly enlarged. Moderate calcification is again seen within aortic arch. Mild bilateral mid and lower lung chronic interstitial thickening. No definite pleural effusion. No pneumothorax is seen. Mild multilevel degenerative disc changes of the thoracic spine. IMPRESSION: Mild chronic bilateral interstitial thickening is favored to represent interstitial scarring given similarity to multiple prior comparison studies. Acute interstitial pulmonary edema can have a similar appearance. Electronically Signed   By: Yvonne Kendall M.D.   On: 11/25/2021 14:58   NM Pulmonary Perfusion  Result Date: 11/25/2021 CLINICAL DATA:  Shortness of breath and chest pain. EXAM: NUCLEAR MEDICINE PERFUSION LUNG SCAN TECHNIQUE: Perfusion images were obtained in multiple projections after intravenous injection of radiopharmaceutical. Ventilation scans intentionally deferred if perfusion scan and chest x-ray adequate for interpretation during COVID 19 epidemic. RADIOPHARMACEUTICALS:  4.07 mCi Tc-72mMAA IV COMPARISON:  Chest radiograph from 11/22/2021. FINDINGS: Heterogeneous distribution of the radiopharmaceutical is noted within both lungs which may be seen in the setting of chronic obstructive  pulmonary disease. A large nonsegmental defect is identified within the left upper lobe with rounded margins not typical for pulmonary embolus. No peripheral, segmental perfusion defects identified to suggest acute pulmonary embolus. IMPRESSION: 1. No findings to suggest acute pulmonary embolus. If there is a high clinical suspicion for acute pulmonary embolus then further evaluation with CTA of the chest and/or lower extremity Dopplers is recommended. Electronically Signed   By: TKerby MoorsM.D.   On: 11/25/2021 14:27   DG Chest Port 1 View  Result Date: 11/22/2021 CLINICAL DATA:  Shortness of breath beginning 2 days ago. EXAM: PORTABLE CHEST 1 VIEW COMPARISON:  Two-view chest x-ray 09/27/2021 FINDINGS: Heart is mildly enlarged. Atherosclerotic calcifications are again seen at the aortic arch. Mild edema is present. Significant effusion. No airspace consolidation. Mild degenerative changes are again seen in the thoracic spine. IMPRESSION: Mild cardiomegaly and mild edema, compatible with congestive heart failure. Electronically Signed   By: CSan MorelleM.D.   On: 11/22/2021 21:03    Micro Results   Recent Results (from the past 240 hour(s))  Blood culture (routine x 2)     Status: None (Preliminary result)   Collection Time: 12/06/21  1:13 PM   Specimen: Left Antecubital; Blood  Result Value Ref Range Status   Specimen Description LEFT ANTECUBITAL  Final   Special Requests   Final    BOTTLES DRAWN AEROBIC AND ANAEROBIC Blood Culture adequate volume   Culture   Final    NO GROWTH 4 DAYS Performed at [facility], [address], [place], [place] [address]    Report Status PENDING  Incomplete  Blood culture (routine x 2)     Status: None (Preliminary result)   Collection Time: 12/06/21  1:14 PM   Specimen: BLOOD LEFT HAND  Result Value Ref Range Status   Specimen Description BLOOD LEFT HAND  Final   Special Requests   Final    BOTTLES DRAWN AEROBIC AND ANAEROBIC Blood Culture  adequate volume   Culture   Final    NO GROWTH 4 DAYS Performed at [facility], [address], [place], [place] [address]    Report Status PENDING  Incomplete   Today   Subjective    Steffan Caniglia today has no new complaints  No fever  Or chills   No Nausea, Vomiting or Diarrhea No chest pains, no dyspnea at rest... DOE has improved significantly  Patient has been seen and examined prior to discharge   Objective   Blood pressure (!) 122/44, pulse 60, temperature 98 F (36.7 C), temperature source Oral, resp. rate 20, height '5\' 9"'$  (1.753 m), weight 78.6 kg, SpO2 95 %.   Intake/Output Summary (Last 24 hours) at 12/10/2021 1128 Last data filed at 12/10/2021 1107 Gross per 24 hour  Intake 960 ml  Output 1900 ml  Net -940 ml    Exam Gen:- Awake Alert, no acute distress, no conversational dyspnea HEENT:- [place].AT, No sclera icterus Nose--[place] 3L/min Neck-Supple Neck,No JVD,.  Lungs-  improved air movement, no wheezing CV- S1, S2 normal, regular Abd-  +ve B.Sounds, Abd Soft, No tenderness,    Extremity/Skin:- No  edema,   good pulses Psych-affect is appropriate, oriented x3 Neuro-no new focal deficits, no tremors  -Extremities -Chronic bilateral upper extremity edema/lymphedema intact peripheral pulses    Data Review   CBC w Diff:  Lab Results  Component Value Date   WBC 8.1 12/07/2021   HGB 11.2 (L) 12/07/2021   HGB 11.9 (L) 09/27/2021   HCT 35.0 (L) 12/07/2021   HCT 36.7 (L) 09/27/2021   PLT 274 12/07/2021   PLT 393 09/27/2021   LYMPHOPCT 4 12/06/2021   MONOPCT 4 12/06/2021   EOSPCT 0 12/06/2021   BASOPCT 0 12/06/2021   CMP:  Lab Results  Component Value Date   NA 136 12/09/2021   NA 144 09/27/2021   K 3.8 12/09/2021   CL 96 (L) 12/09/2021   CO2 33 (H) 12/09/2021   BUN 45 (H) 12/09/2021   BUN 12 09/27/2021   CREATININE 1.59 (H) 12/09/2021   PROT 6.8 12/06/2021   PROT 6.4 09/27/2021   ALBUMIN 3.2 (L) 12/06/2021   ALBUMIN 4.1 09/27/2021   BILITOT 0.7  12/06/2021   BILITOT 0.4 09/27/2021   ALKPHOS 83 12/06/2021   AST 15 12/06/2021   ALT 14 12/06/2021  .  Total Discharge time is about 33 minutes  Roxan Hockey M.D on 12/10/2021 at 11:28 AM  Go to [email or web address] -  for contact info  [facility] - Office  [phone]

## 2021-12-11 ENCOUNTER — Telehealth: Payer: Self-pay

## 2021-12-11 LAB — CULTURE, BLOOD (ROUTINE X 2)
Culture: NO GROWTH
Culture: NO GROWTH
Special Requests: ADEQUATE
Special Requests: ADEQUATE

## 2021-12-11 NOTE — Telephone Encounter (Signed)
Transition Care Management Unsuccessful Follow-up Telephone Call  Date of discharge and from where:  12/10/21 - [facility] - COPD exacerbation  Attempts:  1st Attempt  Reason for unsuccessful TCM follow-up call:  Left voice message   Transition Care Management Unsuccessful Follow-up Telephone Call  Date of discharge and from where:  [facility] - 12/10/21 - COPD exacerbation  Attempts:  2nd Attempt  Reason for unsuccessful TCM follow-up call:  Voice mail full

## 2021-12-12 ENCOUNTER — Ambulatory Visit (INDEPENDENT_AMBULATORY_CARE_PROVIDER_SITE_OTHER): Payer: Medicare HMO

## 2021-12-12 DIAGNOSIS — D649 Anemia, unspecified: Secondary | ICD-10-CM | POA: Diagnosis not present

## 2021-12-12 DIAGNOSIS — I5033 Acute on chronic diastolic (congestive) heart failure: Secondary | ICD-10-CM

## 2021-12-12 DIAGNOSIS — Z9181 History of falling: Secondary | ICD-10-CM | POA: Diagnosis not present

## 2021-12-12 DIAGNOSIS — F1320 Sedative, hypnotic or anxiolytic dependence, uncomplicated: Secondary | ICD-10-CM | POA: Diagnosis not present

## 2021-12-12 DIAGNOSIS — J441 Chronic obstructive pulmonary disease with (acute) exacerbation: Secondary | ICD-10-CM | POA: Diagnosis not present

## 2021-12-12 DIAGNOSIS — J9602 Acute respiratory failure with hypercapnia: Secondary | ICD-10-CM

## 2021-12-12 DIAGNOSIS — I480 Paroxysmal atrial fibrillation: Secondary | ICD-10-CM | POA: Diagnosis not present

## 2021-12-12 DIAGNOSIS — Z7901 Long term (current) use of anticoagulants: Secondary | ICD-10-CM | POA: Diagnosis not present

## 2021-12-12 DIAGNOSIS — G9340 Encephalopathy, unspecified: Secondary | ICD-10-CM

## 2021-12-12 DIAGNOSIS — J9621 Acute and chronic respiratory failure with hypoxia: Secondary | ICD-10-CM | POA: Diagnosis not present

## 2021-12-12 DIAGNOSIS — I890 Lymphedema, not elsewhere classified: Secondary | ICD-10-CM

## 2021-12-12 DIAGNOSIS — I4892 Unspecified atrial flutter: Secondary | ICD-10-CM | POA: Diagnosis not present

## 2021-12-12 DIAGNOSIS — E876 Hypokalemia: Secondary | ICD-10-CM | POA: Diagnosis not present

## 2021-12-12 DIAGNOSIS — I700 Atherosclerosis of aorta: Secondary | ICD-10-CM | POA: Diagnosis not present

## 2021-12-12 DIAGNOSIS — I110 Hypertensive heart disease with heart failure: Secondary | ICD-10-CM

## 2021-12-12 DIAGNOSIS — E440 Moderate protein-calorie malnutrition: Secondary | ICD-10-CM | POA: Diagnosis not present

## 2021-12-12 DIAGNOSIS — I2510 Atherosclerotic heart disease of native coronary artery without angina pectoris: Secondary | ICD-10-CM

## 2021-12-12 DIAGNOSIS — F32A Depression, unspecified: Secondary | ICD-10-CM | POA: Diagnosis not present

## 2021-12-12 DIAGNOSIS — I252 Old myocardial infarction: Secondary | ICD-10-CM | POA: Diagnosis not present

## 2021-12-12 DIAGNOSIS — E1140 Type 2 diabetes mellitus with diabetic neuropathy, unspecified: Secondary | ICD-10-CM | POA: Diagnosis not present

## 2021-12-12 DIAGNOSIS — F17210 Nicotine dependence, cigarettes, uncomplicated: Secondary | ICD-10-CM | POA: Diagnosis not present

## 2021-12-12 DIAGNOSIS — Z7982 Long term (current) use of aspirin: Secondary | ICD-10-CM | POA: Diagnosis not present

## 2021-12-12 DIAGNOSIS — F419 Anxiety disorder, unspecified: Secondary | ICD-10-CM | POA: Diagnosis not present

## 2021-12-12 DIAGNOSIS — Z9981 Dependence on supplemental oxygen: Secondary | ICD-10-CM | POA: Diagnosis not present

## 2021-12-13 DIAGNOSIS — Z7982 Long term (current) use of aspirin: Secondary | ICD-10-CM | POA: Diagnosis not present

## 2021-12-13 DIAGNOSIS — I5033 Acute on chronic diastolic (congestive) heart failure: Secondary | ICD-10-CM | POA: Diagnosis not present

## 2021-12-13 DIAGNOSIS — J9621 Acute and chronic respiratory failure with hypoxia: Secondary | ICD-10-CM | POA: Diagnosis not present

## 2021-12-13 DIAGNOSIS — I252 Old myocardial infarction: Secondary | ICD-10-CM | POA: Diagnosis not present

## 2021-12-13 DIAGNOSIS — Z9981 Dependence on supplemental oxygen: Secondary | ICD-10-CM | POA: Diagnosis not present

## 2021-12-13 DIAGNOSIS — F1320 Sedative, hypnotic or anxiolytic dependence, uncomplicated: Secondary | ICD-10-CM | POA: Diagnosis not present

## 2021-12-13 DIAGNOSIS — J441 Chronic obstructive pulmonary disease with (acute) exacerbation: Secondary | ICD-10-CM | POA: Diagnosis not present

## 2021-12-13 DIAGNOSIS — I110 Hypertensive heart disease with heart failure: Secondary | ICD-10-CM | POA: Diagnosis not present

## 2021-12-13 DIAGNOSIS — J9602 Acute respiratory failure with hypercapnia: Secondary | ICD-10-CM | POA: Diagnosis not present

## 2021-12-13 DIAGNOSIS — G9340 Encephalopathy, unspecified: Secondary | ICD-10-CM | POA: Diagnosis not present

## 2021-12-13 DIAGNOSIS — E440 Moderate protein-calorie malnutrition: Secondary | ICD-10-CM | POA: Diagnosis not present

## 2021-12-13 DIAGNOSIS — E876 Hypokalemia: Secondary | ICD-10-CM | POA: Diagnosis not present

## 2021-12-13 DIAGNOSIS — E1140 Type 2 diabetes mellitus with diabetic neuropathy, unspecified: Secondary | ICD-10-CM | POA: Diagnosis not present

## 2021-12-13 DIAGNOSIS — Z9181 History of falling: Secondary | ICD-10-CM | POA: Diagnosis not present

## 2021-12-13 DIAGNOSIS — F17210 Nicotine dependence, cigarettes, uncomplicated: Secondary | ICD-10-CM | POA: Diagnosis not present

## 2021-12-13 DIAGNOSIS — F419 Anxiety disorder, unspecified: Secondary | ICD-10-CM | POA: Diagnosis not present

## 2021-12-13 DIAGNOSIS — D649 Anemia, unspecified: Secondary | ICD-10-CM | POA: Diagnosis not present

## 2021-12-13 DIAGNOSIS — Z7901 Long term (current) use of anticoagulants: Secondary | ICD-10-CM | POA: Diagnosis not present

## 2021-12-13 DIAGNOSIS — F32A Depression, unspecified: Secondary | ICD-10-CM | POA: Diagnosis not present

## 2021-12-13 DIAGNOSIS — I700 Atherosclerosis of aorta: Secondary | ICD-10-CM | POA: Diagnosis not present

## 2021-12-13 DIAGNOSIS — I4892 Unspecified atrial flutter: Secondary | ICD-10-CM | POA: Diagnosis not present

## 2021-12-13 DIAGNOSIS — I890 Lymphedema, not elsewhere classified: Secondary | ICD-10-CM | POA: Diagnosis not present

## 2021-12-13 DIAGNOSIS — I2510 Atherosclerotic heart disease of native coronary artery without angina pectoris: Secondary | ICD-10-CM | POA: Diagnosis not present

## 2021-12-13 DIAGNOSIS — I480 Paroxysmal atrial fibrillation: Secondary | ICD-10-CM | POA: Diagnosis not present

## 2021-12-13 NOTE — Telephone Encounter (Signed)
Transition Care Management Unsuccessful Follow-up Telephone Call  Date of discharge and from where:  12/10/2021 - [facility] - COPD exacerbation  Attempts:  3rd Attempt  Reason for unsuccessful TCM follow-up call:  Left voice message

## 2021-12-17 ENCOUNTER — Encounter: Payer: Self-pay | Admitting: Family

## 2021-12-17 ENCOUNTER — Ambulatory Visit (INDEPENDENT_AMBULATORY_CARE_PROVIDER_SITE_OTHER): Payer: Medicare HMO | Admitting: Family

## 2021-12-17 VITALS — BP 100/50 | HR 66 | Temp 98.3°F | Ht 69.0 in | Wt 182.4 lb

## 2021-12-17 DIAGNOSIS — I10 Essential (primary) hypertension: Secondary | ICD-10-CM | POA: Diagnosis not present

## 2021-12-17 DIAGNOSIS — Z9181 History of falling: Secondary | ICD-10-CM | POA: Diagnosis not present

## 2021-12-17 DIAGNOSIS — E876 Hypokalemia: Secondary | ICD-10-CM | POA: Diagnosis not present

## 2021-12-17 DIAGNOSIS — J9611 Chronic respiratory failure with hypoxia: Secondary | ICD-10-CM | POA: Diagnosis not present

## 2021-12-17 DIAGNOSIS — F17210 Nicotine dependence, cigarettes, uncomplicated: Secondary | ICD-10-CM | POA: Diagnosis not present

## 2021-12-17 DIAGNOSIS — J441 Chronic obstructive pulmonary disease with (acute) exacerbation: Secondary | ICD-10-CM

## 2021-12-17 DIAGNOSIS — I5033 Acute on chronic diastolic (congestive) heart failure: Secondary | ICD-10-CM | POA: Diagnosis not present

## 2021-12-17 DIAGNOSIS — J9621 Acute and chronic respiratory failure with hypoxia: Secondary | ICD-10-CM | POA: Diagnosis not present

## 2021-12-17 DIAGNOSIS — Z09 Encounter for follow-up examination after completed treatment for conditions other than malignant neoplasm: Secondary | ICD-10-CM

## 2021-12-17 DIAGNOSIS — Z9981 Dependence on supplemental oxygen: Secondary | ICD-10-CM | POA: Diagnosis not present

## 2021-12-17 DIAGNOSIS — I2510 Atherosclerotic heart disease of native coronary artery without angina pectoris: Secondary | ICD-10-CM | POA: Diagnosis not present

## 2021-12-17 DIAGNOSIS — F32A Depression, unspecified: Secondary | ICD-10-CM | POA: Diagnosis not present

## 2021-12-17 DIAGNOSIS — I959 Hypotension, unspecified: Secondary | ICD-10-CM | POA: Diagnosis not present

## 2021-12-17 DIAGNOSIS — F419 Anxiety disorder, unspecified: Secondary | ICD-10-CM | POA: Diagnosis not present

## 2021-12-17 DIAGNOSIS — E1140 Type 2 diabetes mellitus with diabetic neuropathy, unspecified: Secondary | ICD-10-CM | POA: Diagnosis not present

## 2021-12-17 DIAGNOSIS — I252 Old myocardial infarction: Secondary | ICD-10-CM | POA: Diagnosis not present

## 2021-12-17 DIAGNOSIS — D649 Anemia, unspecified: Secondary | ICD-10-CM | POA: Diagnosis not present

## 2021-12-17 DIAGNOSIS — I509 Heart failure, unspecified: Secondary | ICD-10-CM

## 2021-12-17 DIAGNOSIS — F1320 Sedative, hypnotic or anxiolytic dependence, uncomplicated: Secondary | ICD-10-CM

## 2021-12-17 DIAGNOSIS — R69 Illness, unspecified: Secondary | ICD-10-CM | POA: Diagnosis not present

## 2021-12-17 DIAGNOSIS — J9602 Acute respiratory failure with hypercapnia: Secondary | ICD-10-CM | POA: Diagnosis not present

## 2021-12-17 DIAGNOSIS — I4892 Unspecified atrial flutter: Secondary | ICD-10-CM | POA: Diagnosis not present

## 2021-12-17 DIAGNOSIS — Z7901 Long term (current) use of anticoagulants: Secondary | ICD-10-CM | POA: Diagnosis not present

## 2021-12-17 DIAGNOSIS — I480 Paroxysmal atrial fibrillation: Secondary | ICD-10-CM | POA: Diagnosis not present

## 2021-12-17 DIAGNOSIS — G9340 Encephalopathy, unspecified: Secondary | ICD-10-CM | POA: Diagnosis not present

## 2021-12-17 DIAGNOSIS — I110 Hypertensive heart disease with heart failure: Secondary | ICD-10-CM | POA: Diagnosis not present

## 2021-12-17 DIAGNOSIS — I890 Lymphedema, not elsewhere classified: Secondary | ICD-10-CM | POA: Diagnosis not present

## 2021-12-17 DIAGNOSIS — Z7982 Long term (current) use of aspirin: Secondary | ICD-10-CM | POA: Diagnosis not present

## 2021-12-17 DIAGNOSIS — E1142 Type 2 diabetes mellitus with diabetic polyneuropathy: Secondary | ICD-10-CM | POA: Diagnosis not present

## 2021-12-17 DIAGNOSIS — I700 Atherosclerosis of aorta: Secondary | ICD-10-CM | POA: Diagnosis not present

## 2021-12-17 DIAGNOSIS — E440 Moderate protein-calorie malnutrition: Secondary | ICD-10-CM | POA: Diagnosis not present

## 2021-12-17 LAB — BAYER DCA HB A1C WAIVED: HB A1C (BAYER DCA - WAIVED): 8.2 % — ABNORMAL HIGH (ref 4.8–5.6)

## 2021-12-17 MED ORDER — NITROGLYCERIN 0.4 MG SL SUBL
0.4000 mg | SUBLINGUAL_TABLET | SUBLINGUAL | 1 refills | Status: DC | PRN
Start: 2021-12-17 — End: 2022-05-22

## 2021-12-17 MED ORDER — FUROSEMIDE 40 MG PO TABS: 40.0000 mg | ORAL_TABLET | Freq: Two times a day (BID) | ORAL | 3 refills | Status: DC

## 2021-12-17 MED ORDER — ALPRAZOLAM 0.25 MG PO TABS: 0.2500 mg | ORAL_TABLET | Freq: Two times a day (BID) | ORAL | 2 refills | Status: DC | PRN

## 2021-12-17 NOTE — Progress Notes (Signed)
Subjective:    Patient ID: Ronald [name], male    DOB: [birth year], 75 y.o.   MRN: [id number]  Chief Complaint  Patient presents with   Transitions Of Care   Pt presents to the office today for hospital follow up. He went to the ED on 12/06/21 for SOB and COPD exacerbation. He was discharged on 12/10/21.  His BNP was elevated to 212. Chest x-ray pulmonary edema. He was given IV lasix. He was d/c on Lasix 40 mg in AM and 20 mg evening. He does not weigh himself daily. From hospital notes he gained 10 lbs.  He has home O2 that he uses 3 L at night. His O2 today is 91% sitting on room air. He continues to smoke less than a 1/2 pack a day.   He has PT twice a week.  Congestive Heart Failure Presents for follow-up visit. Associated symptoms include chest pain, edema, fatigue, muscle weakness, palpitations, shortness of breath and unexpected weight change. The symptoms have been stable.  Diabetes He presents for his follow-up diabetic visit. He has type 2 diabetes mellitus. Hypoglycemia symptoms include nervousness/anxiousness. Associated symptoms include chest pain and fatigue. Risk factors for coronary artery disease include dyslipidemia, diabetes mellitus, hypertension, male sex and sedentary lifestyle. (Does not check BS at home)  Anxiety Presents for follow-up visit. Symptoms include chest pain, depressed mood, excessive worry, irritability, nervous/anxious behavior, palpitations and shortness of breath.        Review of Systems  Constitutional:  Positive for fatigue, irritability and unexpected weight change.  Respiratory:  Positive for shortness of breath.   Cardiovascular:  Positive for chest pain and palpitations.  Musculoskeletal:  Positive for muscle weakness.  Psychiatric/Behavioral:  The patient is nervous/anxious.   All other systems reviewed and are negative.      Objective:   Physical Exam Vitals reviewed.  Constitutional:      General: He is not in acute  distress.    Appearance: He is well-developed. He is obese.  HENT:     Head: Normocephalic.     Right Ear: Tympanic membrane normal.  Eyes:     General:        Right eye: No discharge.        Left eye: No discharge.     Pupils: Pupils are equal, round, and reactive to light.  Neck:     Thyroid: No thyromegaly.  Cardiovascular:     Rate and Rhythm: Normal rate and regular rhythm.     Heart sounds: Murmur heard.  Pulmonary:     Effort: Pulmonary effort is normal. No respiratory distress.     Breath sounds: Wheezing present.  Abdominal:     General: Bowel sounds are normal. There is no distension.     Palpations: Abdomen is soft.     Tenderness: There is no abdominal tenderness.  Musculoskeletal:        General: No tenderness. Normal range of motion.     Cervical back: Normal range of motion and neck supple.     Comments: Lymphedema in bilateral arms   Skin:    General: Skin is warm and dry.     Findings: No erythema or rash.  Neurological:     Mental Status: He is alert and oriented to person, place, and time.     Cranial Nerves: No cranial nerve deficit.     Deep Tendon Reflexes: Reflexes are normal and symmetric.  Psychiatric:        Behavior: Behavior normal.  Thought Content: Thought content normal.        Judgment: Judgment normal.      BP (!) 97/48   Pulse 69   Temp 98.3 F (36.8 C) (Temporal)   Ht _0  (1.753 m)   Wt 182 lb 6 oz (82.7 kg)   SpO2 91%   BMI 26.93 kg/m      Assessment & Plan:  Ronald [name] comes in today with chief complaint of Transitions Of Care   Diagnosis and orders addressed:  1. Hospital discharge follow-up - CMP14+EGFR - CBC with Differential/Platelet  2. COPD with acute exacerbation (HCC) - CMP14+EGFR - CBC with Differential/Platelet  3. Chronic respiratory failure with hypoxia (HCC) - CMP14+EGFR - CBC with Differential/Platelet  4. Congestive heart failure, unspecified HF chronicity, unspecified heart failure  type (HCC) Will increase Lasix to 40 mg BID from 40 mg Am and 20 mg in evening  Weigh daily Low salt diet - CMP14+EGFR - CBC with Differential/Platelet - furosemide (LASIX) 40 MG tablet; Take 1 tablet (40 mg total) by mouth 2 (two) times daily.  Dispense: 60 tablet; Refill: 3  5. Essential hypertension - CMP14+EGFR - CBC with Differential/Platelet  6. Type 2 diabetes mellitus with diabetic polyneuropathy, without long-term current use of insulin (HCC) - Bayer DCA Hb A1c Waived - CMP14+EGFR - CBC with Differential/Platelet  7. Hypotension, unspecified hypotension type Stop Norvasc 5 mg  - CMP14+EGFR - CBC with Differential/Platelet  8. Benzodiazepine dependence (HCC) Will decrease xanax to 0.25 mg from 0.5 mg given hypotension - CMP14+EGFR - CBC with Differential/Platelet - ALPRAZolam (XANAX) 0.25 MG tablet; Take 1 tablet (0.25 mg total) by mouth 2 (two) times daily as needed for anxiety.  Dispense: 60 tablet; Refill: 2  9. Anxiety - CMP14+EGFR - CBC with Differential/Platelet - ALPRAZolam (XANAX) 0.25 MG tablet; Take 1 tablet (0.25 mg total) by mouth 2 (two) times daily as needed for anxiety.  Dispense: 60 tablet; Refill: 2   Labs pending Health Maintenance reviewed Diet and exercise encouraged  Follow up plan: 1 week to recheck Hypotension. Stopping Norvas 5 mg and weight check after increase Lasix 40 mg BID.    [name], FNP

## 2021-12-17 NOTE — Patient Instructions (Signed)
Heart Failure, Diagnosis  Heart failure is a condition in which the heart has trouble pumping blood. This may mean that the heart cannot pump enough blood out to the body or that the heart does not fill up with enough blood. For some people with heart failure, fluid may back up into the lungs. There may also be swelling (edema) in the lower legs. Heart failure is usually a long-term (chronic) condition. It is important for you to take good care of yourself and follow the treatment plan from your health care provider. Different stages of heart failure have different treatment plans. The stages are: Stage A: At risk for heart failure. Having no symptoms of heart failure, but being at risk for developing heart failure. Stage B: Pre-heart failure. Having no symptoms of heart failure, but having structural changes to the heart that indicate heart failure. Stage C: Symptomatic heart failure. Having symptoms of heart failure in addition to structural changes to the heart that indicate heart failure. Stage D: Advanced heart failure. Having symptoms that interfere with daily life and frequent hospitalizations related to heart failure. What are the causes? This condition may be caused by: High blood pressure (hypertension). Hypertension causes the heart muscle to work harder than normal. Coronary artery disease, or CAD. CAD is the buildup of cholesterol and fat (plaque) in the arteries of the heart. Heart attack, also called myocardial infarction. This injures the heart muscle, making it hard for the heart to pump blood. Abnormal heart valves. The valves do not open and close properly, forcing the heart to pump harder to keep the blood flowing. Heart muscle disease, inflammation, or infection (cardiomyopathy or myocarditis). This is damage to the heart muscle. It can increase the risk of heart failure. Lung disease. The heart works harder when the lungs are not healthy. What increases the risk? The risk  of heart failure increases as a person ages. This condition is also more likely to develop in people who: Are obese. Use tobacco or nicotine products. Abuse alcohol or drugs. Have taken medicines that can damage the heart, such as chemotherapy drugs. Have any of these conditions: Diabetes. Abnormal heart rhythms. Thyroid problems. Low blood counts (anemia). Chronic kidney disease. Have a family history of heart failure. What are the signs or symptoms? Symptoms of this condition include: Shortness of breath with activity, such as when climbing stairs. A cough that does not go away. Swelling of the feet, ankles, legs, or abdomen. Losing or gaining weight for no reason. Trouble breathing when lying flat. Waking from sleep because of the need to sit up and get more air. Rapid heartbeat. Other symptoms may include: Tiredness (fatigue) and loss of energy. Feeling light-headed, dizzy, or close to fainting. Nausea or loss of appetite. Waking up more often during the night to urinate (nocturia). Confusion. How is this diagnosed? This condition is diagnosed based on: Your medical history, symptoms, and a physical exam. Blood tests. Diagnostic tests, which may include: Echocardiogram. Electrocardiogram (ECG). Chest X-ray. Exercise stress test. Cardiac MRI. Cardiac catheterization and angiogram. Radionuclide scans. How is this treated? Treatment for this condition is aimed at managing the symptoms of heart failure. Medicines Treatment may include medicines that: Help lower blood pressure by relaxing (dilating) the blood vessels. These medicines are called ACE inhibitors (angiotensin-converting enzyme), ARBs (angiotensin receptor blockers), or vasodilators. Cause the kidneys to remove salt and water from the blood through urination (diuretics). Improve heart muscle strength and prevent the heart from beating too fast (beta blockers). Increase the   force of the heartbeat  (digoxin). Lower heart rates. Certain diabetes medicines (SGLT-2 inhibitors) may also be used in treatment. Healthy behavior changes Treatment may also include making healthy lifestyle changes, such as: Reaching and staying at a healthy weight. Not using tobacco or nicotine products. Eating heart-healthy foods. Limiting or avoiding alcohol. Stopping the use of illegal drugs. Being physically active. Participating in a cardiac rehabilitation program, which is a treatment program to improve your health and well-being through exercise training, education, and counseling. Other treatments Other treatments may include: Procedures to open blocked arteries or repair damaged valves. Placing a pacemaker to improve heart function (cardiac resynchronization therapy). Placing a device to treat serious abnormal heart rhythms (implantable cardioverter defibrillator, or ICD). Placing a device to improve the pumping ability of the heart (left ventricular assist device, or LVAD). Receiving a healthy heart from a donor (heart transplant). This is done when other treatments have not helped. Follow these instructions at home: Manage other health conditions as told by your health care provider. These may include hypertension, diabetes, thyroid disease, or abnormal heart rhythms. Get ongoing education and support as needed. Learn as much as you can about heart failure. Keep all follow-up visits. This is important. Where to find more information American Heart Association: www.heart.org Centers for Disease Control and Prevention: http://www.wolf.info/ NIH National Institute on Aging: http://kim-miller.com/ Summary Heart failure is a condition in which the heart has trouble pumping blood. This condition is commonly caused by high blood pressure and other diseases of the heart and lungs. Symptoms of this condition include shortness of breath, tiredness (fatigue), nausea, and swelling of the feet, ankles, legs, or  abdomen. Treatments for this condition may include medicines, lifestyle changes, and surgery. Manage other health conditions as told by your health care provider. This information is not intended to replace advice given to you by your health care provider. Make sure you discuss any questions you have with your health care provider. Document Revised: 11/22/2020 Document Reviewed: 12/17/2019 Elsevier Patient Education  [place].

## 2021-12-18 DIAGNOSIS — E1140 Type 2 diabetes mellitus with diabetic neuropathy, unspecified: Secondary | ICD-10-CM | POA: Diagnosis not present

## 2021-12-18 DIAGNOSIS — J9621 Acute and chronic respiratory failure with hypoxia: Secondary | ICD-10-CM | POA: Diagnosis not present

## 2021-12-18 DIAGNOSIS — Z7982 Long term (current) use of aspirin: Secondary | ICD-10-CM | POA: Diagnosis not present

## 2021-12-18 DIAGNOSIS — F1320 Sedative, hypnotic or anxiolytic dependence, uncomplicated: Secondary | ICD-10-CM | POA: Diagnosis not present

## 2021-12-18 DIAGNOSIS — Z9181 History of falling: Secondary | ICD-10-CM | POA: Diagnosis not present

## 2021-12-18 DIAGNOSIS — E440 Moderate protein-calorie malnutrition: Secondary | ICD-10-CM | POA: Diagnosis not present

## 2021-12-18 DIAGNOSIS — I890 Lymphedema, not elsewhere classified: Secondary | ICD-10-CM | POA: Diagnosis not present

## 2021-12-18 DIAGNOSIS — I480 Paroxysmal atrial fibrillation: Secondary | ICD-10-CM | POA: Diagnosis not present

## 2021-12-18 DIAGNOSIS — I4892 Unspecified atrial flutter: Secondary | ICD-10-CM | POA: Diagnosis not present

## 2021-12-18 DIAGNOSIS — J441 Chronic obstructive pulmonary disease with (acute) exacerbation: Secondary | ICD-10-CM | POA: Diagnosis not present

## 2021-12-18 DIAGNOSIS — F32A Depression, unspecified: Secondary | ICD-10-CM | POA: Diagnosis not present

## 2021-12-18 DIAGNOSIS — D649 Anemia, unspecified: Secondary | ICD-10-CM | POA: Diagnosis not present

## 2021-12-18 DIAGNOSIS — I252 Old myocardial infarction: Secondary | ICD-10-CM | POA: Diagnosis not present

## 2021-12-18 DIAGNOSIS — Z7901 Long term (current) use of anticoagulants: Secondary | ICD-10-CM | POA: Diagnosis not present

## 2021-12-18 DIAGNOSIS — E876 Hypokalemia: Secondary | ICD-10-CM | POA: Diagnosis not present

## 2021-12-18 DIAGNOSIS — I700 Atherosclerosis of aorta: Secondary | ICD-10-CM | POA: Diagnosis not present

## 2021-12-18 DIAGNOSIS — F17210 Nicotine dependence, cigarettes, uncomplicated: Secondary | ICD-10-CM | POA: Diagnosis not present

## 2021-12-18 DIAGNOSIS — Z9981 Dependence on supplemental oxygen: Secondary | ICD-10-CM | POA: Diagnosis not present

## 2021-12-18 DIAGNOSIS — J9602 Acute respiratory failure with hypercapnia: Secondary | ICD-10-CM | POA: Diagnosis not present

## 2021-12-18 DIAGNOSIS — I5033 Acute on chronic diastolic (congestive) heart failure: Secondary | ICD-10-CM | POA: Diagnosis not present

## 2021-12-18 DIAGNOSIS — I2510 Atherosclerotic heart disease of native coronary artery without angina pectoris: Secondary | ICD-10-CM | POA: Diagnosis not present

## 2021-12-18 DIAGNOSIS — I110 Hypertensive heart disease with heart failure: Secondary | ICD-10-CM | POA: Diagnosis not present

## 2021-12-18 DIAGNOSIS — F419 Anxiety disorder, unspecified: Secondary | ICD-10-CM | POA: Diagnosis not present

## 2021-12-18 DIAGNOSIS — G9340 Encephalopathy, unspecified: Secondary | ICD-10-CM | POA: Diagnosis not present

## 2021-12-18 LAB — CMP14+EGFR
ALT: 11 IU/L (ref 0–44)
AST: 9 IU/L (ref 0–40)
Albumin/Globulin Ratio: 1.5 (ref 1.2–2.2)
Albumin: 2.9 g/dL — ABNORMAL LOW (ref 3.8–4.8)
Alkaline Phosphatase: 89 IU/L (ref 44–121)
BUN/Creatinine Ratio: 22 (ref 10–24)
BUN: 44 mg/dL — ABNORMAL HIGH (ref 8–27)
Bilirubin Total: 0.2 mg/dL (ref 0.0–1.2)
CO2: 29 mmol/L (ref 20–29)
Calcium: 8.4 mg/dL — ABNORMAL LOW (ref 8.6–10.2)
Chloride: 97 mmol/L (ref 96–106)
Creatinine, Ser: 1.99 mg/dL — ABNORMAL HIGH (ref 0.76–1.27)
Globulin, Total: 2 g/dL (ref 1.5–4.5)
Glucose: 202 mg/dL — ABNORMAL HIGH (ref 70–99)
Potassium: 3.1 mmol/L — ABNORMAL LOW (ref 3.5–5.2)
Sodium: 140 mmol/L (ref 134–144)
Total Protein: 4.9 g/dL — ABNORMAL LOW (ref 6.0–8.5)
eGFR: 35 mL/min/{1.73_m2} — ABNORMAL LOW (ref >59)

## 2021-12-18 LAB — CBC WITH DIFFERENTIAL/PLATELET
Basophils Absolute: 0 10*3/uL (ref 0.0–0.2)
Basos: 0 %
EOS (ABSOLUTE): 0 10*3/uL (ref 0.0–0.4)
Eos: 0 %
Hematocrit: 35.7 % — ABNORMAL LOW (ref 37.5–51.0)
Hemoglobin: 11.3 g/dL — ABNORMAL LOW (ref 13.0–17.7)
Immature Grans (Abs): 0.1 10*3/uL (ref 0.0–0.1)
Immature Granulocytes: 1 %
Lymphocytes Absolute: 1.4 10*3/uL (ref 0.7–3.1)
Lymphs: 16 %
MCH: 25.9 pg — ABNORMAL LOW (ref 26.6–33.0)
MCHC: 31.7 g/dL (ref 31.5–35.7)
MCV: 82 fL (ref 79–97)
Monocytes Absolute: 1.1 10*3/uL — ABNORMAL HIGH (ref 0.1–0.9)
Monocytes: 13 %
Neutrophils Absolute: 6 10*3/uL (ref 1.4–7.0)
Neutrophils: 70 %
Platelets: 335 10*3/uL (ref 150–450)
RBC: 4.36 x10E6/uL (ref 4.14–5.80)
RDW: 14.4 % (ref 11.6–15.4)
WBC: 8.6 10*3/uL (ref 3.4–10.8)

## 2021-12-19 ENCOUNTER — Other Ambulatory Visit: Payer: Self-pay | Admitting: Family

## 2021-12-19 DIAGNOSIS — I2510 Atherosclerotic heart disease of native coronary artery without angina pectoris: Secondary | ICD-10-CM | POA: Diagnosis not present

## 2021-12-19 DIAGNOSIS — Z9981 Dependence on supplemental oxygen: Secondary | ICD-10-CM | POA: Diagnosis not present

## 2021-12-19 DIAGNOSIS — I480 Paroxysmal atrial fibrillation: Secondary | ICD-10-CM | POA: Diagnosis not present

## 2021-12-19 DIAGNOSIS — E1140 Type 2 diabetes mellitus with diabetic neuropathy, unspecified: Secondary | ICD-10-CM | POA: Diagnosis not present

## 2021-12-19 DIAGNOSIS — I4892 Unspecified atrial flutter: Secondary | ICD-10-CM | POA: Diagnosis not present

## 2021-12-19 DIAGNOSIS — F32A Depression, unspecified: Secondary | ICD-10-CM | POA: Diagnosis not present

## 2021-12-19 DIAGNOSIS — G9340 Encephalopathy, unspecified: Secondary | ICD-10-CM | POA: Diagnosis not present

## 2021-12-19 DIAGNOSIS — I252 Old myocardial infarction: Secondary | ICD-10-CM | POA: Diagnosis not present

## 2021-12-19 DIAGNOSIS — J441 Chronic obstructive pulmonary disease with (acute) exacerbation: Secondary | ICD-10-CM | POA: Diagnosis not present

## 2021-12-19 DIAGNOSIS — Z9181 History of falling: Secondary | ICD-10-CM | POA: Diagnosis not present

## 2021-12-19 DIAGNOSIS — F17210 Nicotine dependence, cigarettes, uncomplicated: Secondary | ICD-10-CM | POA: Diagnosis not present

## 2021-12-19 DIAGNOSIS — F419 Anxiety disorder, unspecified: Secondary | ICD-10-CM | POA: Diagnosis not present

## 2021-12-19 DIAGNOSIS — I700 Atherosclerosis of aorta: Secondary | ICD-10-CM | POA: Diagnosis not present

## 2021-12-19 DIAGNOSIS — I110 Hypertensive heart disease with heart failure: Secondary | ICD-10-CM | POA: Diagnosis not present

## 2021-12-19 DIAGNOSIS — Z7982 Long term (current) use of aspirin: Secondary | ICD-10-CM | POA: Diagnosis not present

## 2021-12-19 DIAGNOSIS — D649 Anemia, unspecified: Secondary | ICD-10-CM | POA: Diagnosis not present

## 2021-12-19 DIAGNOSIS — Z7901 Long term (current) use of anticoagulants: Secondary | ICD-10-CM | POA: Diagnosis not present

## 2021-12-19 DIAGNOSIS — I5033 Acute on chronic diastolic (congestive) heart failure: Secondary | ICD-10-CM | POA: Diagnosis not present

## 2021-12-19 DIAGNOSIS — J9602 Acute respiratory failure with hypercapnia: Secondary | ICD-10-CM | POA: Diagnosis not present

## 2021-12-19 DIAGNOSIS — E876 Hypokalemia: Secondary | ICD-10-CM | POA: Diagnosis not present

## 2021-12-19 DIAGNOSIS — I890 Lymphedema, not elsewhere classified: Secondary | ICD-10-CM | POA: Diagnosis not present

## 2021-12-19 DIAGNOSIS — F1320 Sedative, hypnotic or anxiolytic dependence, uncomplicated: Secondary | ICD-10-CM | POA: Diagnosis not present

## 2021-12-19 DIAGNOSIS — E440 Moderate protein-calorie malnutrition: Secondary | ICD-10-CM | POA: Diagnosis not present

## 2021-12-19 DIAGNOSIS — J9621 Acute and chronic respiratory failure with hypoxia: Secondary | ICD-10-CM | POA: Diagnosis not present

## 2021-12-19 MED ORDER — EMPAGLIFLOZIN 10 MG PO TABS: 10.0000 mg | ORAL_TABLET | Freq: Every day | ORAL | 1 refills | Status: DC

## 2021-12-19 MED ORDER — POTASSIUM CHLORIDE CRYS ER 20 MEQ PO TBCR: 20.0000 meq | EXTENDED_RELEASE_TABLET | Freq: Two times a day (BID) | ORAL | 0 refills | Status: DC

## 2021-12-20 DIAGNOSIS — F32A Depression, unspecified: Secondary | ICD-10-CM | POA: Diagnosis not present

## 2021-12-20 DIAGNOSIS — J9602 Acute respiratory failure with hypercapnia: Secondary | ICD-10-CM | POA: Diagnosis not present

## 2021-12-20 DIAGNOSIS — E440 Moderate protein-calorie malnutrition: Secondary | ICD-10-CM | POA: Diagnosis not present

## 2021-12-20 DIAGNOSIS — I480 Paroxysmal atrial fibrillation: Secondary | ICD-10-CM | POA: Diagnosis not present

## 2021-12-20 DIAGNOSIS — E1140 Type 2 diabetes mellitus with diabetic neuropathy, unspecified: Secondary | ICD-10-CM | POA: Diagnosis not present

## 2021-12-20 DIAGNOSIS — G9340 Encephalopathy, unspecified: Secondary | ICD-10-CM | POA: Diagnosis not present

## 2021-12-20 DIAGNOSIS — Z7982 Long term (current) use of aspirin: Secondary | ICD-10-CM | POA: Diagnosis not present

## 2021-12-20 DIAGNOSIS — F419 Anxiety disorder, unspecified: Secondary | ICD-10-CM | POA: Diagnosis not present

## 2021-12-20 DIAGNOSIS — D649 Anemia, unspecified: Secondary | ICD-10-CM | POA: Diagnosis not present

## 2021-12-20 DIAGNOSIS — Z9181 History of falling: Secondary | ICD-10-CM | POA: Diagnosis not present

## 2021-12-20 DIAGNOSIS — I110 Hypertensive heart disease with heart failure: Secondary | ICD-10-CM | POA: Diagnosis not present

## 2021-12-20 DIAGNOSIS — I700 Atherosclerosis of aorta: Secondary | ICD-10-CM | POA: Diagnosis not present

## 2021-12-20 DIAGNOSIS — F17210 Nicotine dependence, cigarettes, uncomplicated: Secondary | ICD-10-CM | POA: Diagnosis not present

## 2021-12-20 DIAGNOSIS — I4892 Unspecified atrial flutter: Secondary | ICD-10-CM | POA: Diagnosis not present

## 2021-12-20 DIAGNOSIS — Z7901 Long term (current) use of anticoagulants: Secondary | ICD-10-CM | POA: Diagnosis not present

## 2021-12-20 DIAGNOSIS — I890 Lymphedema, not elsewhere classified: Secondary | ICD-10-CM | POA: Diagnosis not present

## 2021-12-20 DIAGNOSIS — J9621 Acute and chronic respiratory failure with hypoxia: Secondary | ICD-10-CM | POA: Diagnosis not present

## 2021-12-20 DIAGNOSIS — I252 Old myocardial infarction: Secondary | ICD-10-CM | POA: Diagnosis not present

## 2021-12-20 DIAGNOSIS — E876 Hypokalemia: Secondary | ICD-10-CM | POA: Diagnosis not present

## 2021-12-20 DIAGNOSIS — I5033 Acute on chronic diastolic (congestive) heart failure: Secondary | ICD-10-CM | POA: Diagnosis not present

## 2021-12-20 DIAGNOSIS — F1320 Sedative, hypnotic or anxiolytic dependence, uncomplicated: Secondary | ICD-10-CM | POA: Diagnosis not present

## 2021-12-20 DIAGNOSIS — J441 Chronic obstructive pulmonary disease with (acute) exacerbation: Secondary | ICD-10-CM | POA: Diagnosis not present

## 2021-12-20 DIAGNOSIS — I2510 Atherosclerotic heart disease of native coronary artery without angina pectoris: Secondary | ICD-10-CM | POA: Diagnosis not present

## 2021-12-20 DIAGNOSIS — Z9981 Dependence on supplemental oxygen: Secondary | ICD-10-CM | POA: Diagnosis not present

## 2021-12-23 ENCOUNTER — Telehealth: Payer: Self-pay | Admitting: Family

## 2021-12-23 DIAGNOSIS — Z515 Encounter for palliative care: Secondary | ICD-10-CM | POA: Diagnosis not present

## 2021-12-23 DIAGNOSIS — J9612 Chronic respiratory failure with hypercapnia: Secondary | ICD-10-CM | POA: Diagnosis not present

## 2021-12-23 DIAGNOSIS — J9611 Chronic respiratory failure with hypoxia: Secondary | ICD-10-CM | POA: Diagnosis not present

## 2021-12-23 DIAGNOSIS — I5032 Chronic diastolic (congestive) heart failure: Secondary | ICD-10-CM | POA: Diagnosis not present

## 2021-12-23 DIAGNOSIS — J449 Chronic obstructive pulmonary disease, unspecified: Secondary | ICD-10-CM | POA: Diagnosis not present

## 2021-12-23 NOTE — Telephone Encounter (Signed)
No consent to speak with her.

## 2021-12-24 ENCOUNTER — Telehealth: Payer: Self-pay | Admitting: Family

## 2021-12-24 DIAGNOSIS — I5033 Acute on chronic diastolic (congestive) heart failure: Secondary | ICD-10-CM | POA: Diagnosis not present

## 2021-12-24 DIAGNOSIS — E1140 Type 2 diabetes mellitus with diabetic neuropathy, unspecified: Secondary | ICD-10-CM | POA: Diagnosis not present

## 2021-12-24 DIAGNOSIS — J441 Chronic obstructive pulmonary disease with (acute) exacerbation: Secondary | ICD-10-CM | POA: Diagnosis not present

## 2021-12-24 DIAGNOSIS — G9340 Encephalopathy, unspecified: Secondary | ICD-10-CM | POA: Diagnosis not present

## 2021-12-24 DIAGNOSIS — I480 Paroxysmal atrial fibrillation: Secondary | ICD-10-CM | POA: Diagnosis not present

## 2021-12-24 DIAGNOSIS — Z7982 Long term (current) use of aspirin: Secondary | ICD-10-CM | POA: Diagnosis not present

## 2021-12-24 DIAGNOSIS — J9602 Acute respiratory failure with hypercapnia: Secondary | ICD-10-CM | POA: Diagnosis not present

## 2021-12-24 DIAGNOSIS — I4892 Unspecified atrial flutter: Secondary | ICD-10-CM | POA: Diagnosis not present

## 2021-12-24 DIAGNOSIS — E440 Moderate protein-calorie malnutrition: Secondary | ICD-10-CM | POA: Diagnosis not present

## 2021-12-24 DIAGNOSIS — I2510 Atherosclerotic heart disease of native coronary artery without angina pectoris: Secondary | ICD-10-CM | POA: Diagnosis not present

## 2021-12-24 DIAGNOSIS — I890 Lymphedema, not elsewhere classified: Secondary | ICD-10-CM | POA: Diagnosis not present

## 2021-12-24 DIAGNOSIS — J9621 Acute and chronic respiratory failure with hypoxia: Secondary | ICD-10-CM | POA: Diagnosis not present

## 2021-12-24 DIAGNOSIS — Z9981 Dependence on supplemental oxygen: Secondary | ICD-10-CM | POA: Diagnosis not present

## 2021-12-24 DIAGNOSIS — I700 Atherosclerosis of aorta: Secondary | ICD-10-CM | POA: Diagnosis not present

## 2021-12-24 DIAGNOSIS — Z7901 Long term (current) use of anticoagulants: Secondary | ICD-10-CM | POA: Diagnosis not present

## 2021-12-24 DIAGNOSIS — Z9181 History of falling: Secondary | ICD-10-CM | POA: Diagnosis not present

## 2021-12-24 DIAGNOSIS — F32A Depression, unspecified: Secondary | ICD-10-CM | POA: Diagnosis not present

## 2021-12-24 DIAGNOSIS — D649 Anemia, unspecified: Secondary | ICD-10-CM | POA: Diagnosis not present

## 2021-12-24 DIAGNOSIS — E876 Hypokalemia: Secondary | ICD-10-CM | POA: Diagnosis not present

## 2021-12-24 DIAGNOSIS — F1320 Sedative, hypnotic or anxiolytic dependence, uncomplicated: Secondary | ICD-10-CM | POA: Diagnosis not present

## 2021-12-24 DIAGNOSIS — F419 Anxiety disorder, unspecified: Secondary | ICD-10-CM | POA: Diagnosis not present

## 2021-12-24 DIAGNOSIS — I252 Old myocardial infarction: Secondary | ICD-10-CM | POA: Diagnosis not present

## 2021-12-24 DIAGNOSIS — F17210 Nicotine dependence, cigarettes, uncomplicated: Secondary | ICD-10-CM | POA: Diagnosis not present

## 2021-12-24 DIAGNOSIS — I110 Hypertensive heart disease with heart failure: Secondary | ICD-10-CM | POA: Diagnosis not present

## 2021-12-24 MED ORDER — POTASSIUM CHLORIDE 20 MEQ PO PACK
20.0000 meq | PACK | Freq: Two times a day (BID) | ORAL | 2 refills | Status: DC
Start: 2021-12-24 — End: 2022-05-05

## 2021-12-24 NOTE — Telephone Encounter (Signed)
Is there a powder or liquid form of potassium patient can take because he will not take the pill.

## 2021-12-24 NOTE — Telephone Encounter (Signed)
[place]

## 2021-12-24 NOTE — Telephone Encounter (Signed)
Potassium chloride switched to a packet. Prescription sent to pharmacy

## 2021-12-24 NOTE — Telephone Encounter (Signed)
Patient's daughter in law calling to go over meds. Aware that she is not on the DPR and will discuss with patient.

## 2021-12-26 DIAGNOSIS — F17210 Nicotine dependence, cigarettes, uncomplicated: Secondary | ICD-10-CM | POA: Diagnosis not present

## 2021-12-26 DIAGNOSIS — D649 Anemia, unspecified: Secondary | ICD-10-CM | POA: Diagnosis not present

## 2021-12-26 DIAGNOSIS — Z7901 Long term (current) use of anticoagulants: Secondary | ICD-10-CM | POA: Diagnosis not present

## 2021-12-26 DIAGNOSIS — J9621 Acute and chronic respiratory failure with hypoxia: Secondary | ICD-10-CM | POA: Diagnosis not present

## 2021-12-26 DIAGNOSIS — J9602 Acute respiratory failure with hypercapnia: Secondary | ICD-10-CM | POA: Diagnosis not present

## 2021-12-26 DIAGNOSIS — I4892 Unspecified atrial flutter: Secondary | ICD-10-CM | POA: Diagnosis not present

## 2021-12-26 DIAGNOSIS — J441 Chronic obstructive pulmonary disease with (acute) exacerbation: Secondary | ICD-10-CM | POA: Diagnosis not present

## 2021-12-26 DIAGNOSIS — I110 Hypertensive heart disease with heart failure: Secondary | ICD-10-CM | POA: Diagnosis not present

## 2021-12-26 DIAGNOSIS — G9340 Encephalopathy, unspecified: Secondary | ICD-10-CM | POA: Diagnosis not present

## 2021-12-26 DIAGNOSIS — E876 Hypokalemia: Secondary | ICD-10-CM | POA: Diagnosis not present

## 2021-12-26 DIAGNOSIS — F1320 Sedative, hypnotic or anxiolytic dependence, uncomplicated: Secondary | ICD-10-CM | POA: Diagnosis not present

## 2021-12-26 DIAGNOSIS — F419 Anxiety disorder, unspecified: Secondary | ICD-10-CM | POA: Diagnosis not present

## 2021-12-26 DIAGNOSIS — I480 Paroxysmal atrial fibrillation: Secondary | ICD-10-CM | POA: Diagnosis not present

## 2021-12-26 DIAGNOSIS — Z9981 Dependence on supplemental oxygen: Secondary | ICD-10-CM | POA: Diagnosis not present

## 2021-12-26 DIAGNOSIS — I5033 Acute on chronic diastolic (congestive) heart failure: Secondary | ICD-10-CM | POA: Diagnosis not present

## 2021-12-26 DIAGNOSIS — E440 Moderate protein-calorie malnutrition: Secondary | ICD-10-CM | POA: Diagnosis not present

## 2021-12-26 DIAGNOSIS — E1140 Type 2 diabetes mellitus with diabetic neuropathy, unspecified: Secondary | ICD-10-CM | POA: Diagnosis not present

## 2021-12-26 DIAGNOSIS — Z7982 Long term (current) use of aspirin: Secondary | ICD-10-CM | POA: Diagnosis not present

## 2021-12-26 DIAGNOSIS — I700 Atherosclerosis of aorta: Secondary | ICD-10-CM | POA: Diagnosis not present

## 2021-12-26 DIAGNOSIS — F32A Depression, unspecified: Secondary | ICD-10-CM | POA: Diagnosis not present

## 2021-12-26 DIAGNOSIS — Z9181 History of falling: Secondary | ICD-10-CM | POA: Diagnosis not present

## 2021-12-26 DIAGNOSIS — I890 Lymphedema, not elsewhere classified: Secondary | ICD-10-CM | POA: Diagnosis not present

## 2021-12-26 DIAGNOSIS — I2510 Atherosclerotic heart disease of native coronary artery without angina pectoris: Secondary | ICD-10-CM | POA: Diagnosis not present

## 2021-12-26 DIAGNOSIS — I252 Old myocardial infarction: Secondary | ICD-10-CM | POA: Diagnosis not present

## 2021-12-27 ENCOUNTER — Encounter: Payer: Self-pay | Admitting: Family

## 2021-12-27 ENCOUNTER — Ambulatory Visit (INDEPENDENT_AMBULATORY_CARE_PROVIDER_SITE_OTHER): Payer: Medicare HMO | Admitting: Family

## 2021-12-27 VITALS — BP 101/48 | HR 71 | Temp 97.2°F | Ht 69.0 in | Wt 174.0 lb

## 2021-12-27 DIAGNOSIS — J9621 Acute and chronic respiratory failure with hypoxia: Secondary | ICD-10-CM | POA: Diagnosis not present

## 2021-12-27 DIAGNOSIS — I509 Heart failure, unspecified: Secondary | ICD-10-CM | POA: Diagnosis not present

## 2021-12-27 DIAGNOSIS — I130 Hypertensive heart and chronic kidney disease with heart failure and stage 1 through stage 4 chronic kidney disease, or unspecified chronic kidney disease: Secondary | ICD-10-CM | POA: Diagnosis not present

## 2021-12-27 DIAGNOSIS — G9340 Encephalopathy, unspecified: Secondary | ICD-10-CM | POA: Diagnosis not present

## 2021-12-27 DIAGNOSIS — I700 Atherosclerosis of aorta: Secondary | ICD-10-CM | POA: Diagnosis not present

## 2021-12-27 DIAGNOSIS — D649 Anemia, unspecified: Secondary | ICD-10-CM | POA: Diagnosis not present

## 2021-12-27 DIAGNOSIS — E1142 Type 2 diabetes mellitus with diabetic polyneuropathy: Secondary | ICD-10-CM | POA: Diagnosis not present

## 2021-12-27 DIAGNOSIS — F1320 Sedative, hypnotic or anxiolytic dependence, uncomplicated: Secondary | ICD-10-CM | POA: Diagnosis not present

## 2021-12-27 DIAGNOSIS — J9602 Acute respiratory failure with hypercapnia: Secondary | ICD-10-CM | POA: Diagnosis not present

## 2021-12-27 DIAGNOSIS — F17210 Nicotine dependence, cigarettes, uncomplicated: Secondary | ICD-10-CM | POA: Diagnosis not present

## 2021-12-27 DIAGNOSIS — Z7901 Long term (current) use of anticoagulants: Secondary | ICD-10-CM | POA: Diagnosis not present

## 2021-12-27 DIAGNOSIS — I890 Lymphedema, not elsewhere classified: Secondary | ICD-10-CM | POA: Diagnosis not present

## 2021-12-27 DIAGNOSIS — I5033 Acute on chronic diastolic (congestive) heart failure: Secondary | ICD-10-CM

## 2021-12-27 DIAGNOSIS — N1831 Chronic kidney disease, stage 3a: Secondary | ICD-10-CM | POA: Diagnosis not present

## 2021-12-27 DIAGNOSIS — I2510 Atherosclerotic heart disease of native coronary artery without angina pectoris: Secondary | ICD-10-CM | POA: Diagnosis not present

## 2021-12-27 DIAGNOSIS — E1122 Type 2 diabetes mellitus with diabetic chronic kidney disease: Secondary | ICD-10-CM | POA: Diagnosis not present

## 2021-12-27 DIAGNOSIS — I10 Essential (primary) hypertension: Secondary | ICD-10-CM

## 2021-12-27 DIAGNOSIS — I252 Old myocardial infarction: Secondary | ICD-10-CM | POA: Diagnosis not present

## 2021-12-27 DIAGNOSIS — F32A Depression, unspecified: Secondary | ICD-10-CM | POA: Diagnosis not present

## 2021-12-27 DIAGNOSIS — E1140 Type 2 diabetes mellitus with diabetic neuropathy, unspecified: Secondary | ICD-10-CM | POA: Diagnosis not present

## 2021-12-27 DIAGNOSIS — F419 Anxiety disorder, unspecified: Secondary | ICD-10-CM | POA: Diagnosis not present

## 2021-12-27 DIAGNOSIS — N179 Acute kidney failure, unspecified: Secondary | ICD-10-CM | POA: Diagnosis not present

## 2021-12-27 DIAGNOSIS — Z9981 Dependence on supplemental oxygen: Secondary | ICD-10-CM | POA: Diagnosis not present

## 2021-12-27 DIAGNOSIS — E876 Hypokalemia: Secondary | ICD-10-CM | POA: Diagnosis not present

## 2021-12-27 DIAGNOSIS — E440 Moderate protein-calorie malnutrition: Secondary | ICD-10-CM | POA: Diagnosis not present

## 2021-12-27 DIAGNOSIS — I4892 Unspecified atrial flutter: Secondary | ICD-10-CM | POA: Diagnosis not present

## 2021-12-27 DIAGNOSIS — J441 Chronic obstructive pulmonary disease with (acute) exacerbation: Secondary | ICD-10-CM | POA: Diagnosis not present

## 2021-12-27 DIAGNOSIS — I480 Paroxysmal atrial fibrillation: Secondary | ICD-10-CM | POA: Diagnosis not present

## 2021-12-27 NOTE — Progress Notes (Signed)
Subjective:    Patient ID: Ronald [name], male    DOB: 12/15/1945, 76 y.o.   MRN: [id number]  Chief Complaint  Patient presents with   Medical Management of Chronic Issues   Pt presents to the office today to recheck hypotension and CHF.   We stopped his Norvasc 5 mg. His BP is slightly improved, but still low.   We also increased his lasix to 40 mg BID from 40 mg AM and 20 mg evening. He has lost 8 lb in the last two weeks.   His A1C was elevated. We started him Jaridance 10 mg.   Also found to be hypokalemia. Started on potassium chloride 20 meq BID.  Congestive Heart Failure Presents for follow-up visit. Associated symptoms include chest pain ("very little"), chest pressure, edema, fatigue, muscle weakness and shortness of breath.      Review of Systems  Constitutional:  Positive for fatigue.  Respiratory:  Positive for shortness of breath.   Cardiovascular:  Positive for chest pain ("very little").  Musculoskeletal:  Positive for muscle weakness.       Objective:   Physical Exam Vitals reviewed.  Constitutional:      General: He is not in acute distress.    Appearance: He is well-developed. He is obese.  HENT:     Head: Normocephalic.  Eyes:     General:        Right eye: No discharge.        Left eye: No discharge.     Pupils: Pupils are equal, round, and reactive to light.  Neck:     Thyroid: No thyromegaly.  Cardiovascular:     Rate and Rhythm: Normal rate and regular rhythm.     Heart sounds: Normal heart sounds. No murmur heard. Pulmonary:     Effort: Pulmonary effort is normal. No respiratory distress.     Breath sounds: Wheezing and rhonchi present.  Abdominal:     General: Bowel sounds are normal. There is no distension.     Palpations: Abdomen is soft.     Tenderness: There is no abdominal tenderness.  Musculoskeletal:        General: No tenderness. Normal range of motion.     Cervical back: Normal range of motion and neck supple.     Right  lower leg: No edema.     Left lower leg: No edema.     Comments: Bilateral lymphedema in arms  Skin:    General: Skin is warm and dry.     Findings: No erythema or rash.  Neurological:     Mental Status: He is alert and oriented to person, place, and time.     Cranial Nerves: No cranial nerve deficit.     Deep Tendon Reflexes: Reflexes are normal and symmetric.  Psychiatric:        Behavior: Behavior normal.        Thought Content: Thought content normal.        Judgment: Judgment normal.       BP (!) 101/48   Pulse 71   Temp (!) 97.2 F (36.2 C)   Ht _0  (1.753 m)   Wt 174 lb (78.9 kg)   SpO2 (!) 89%   BMI 25.70 kg/m      Assessment & Plan:  Ronald [name] comes in today with chief complaint of Medical Management of Chronic Issues   Diagnosis and orders addressed:  1. Congestive heart failure, unspecified HF chronicity, unspecified heart failure type (  HCC) Continue Lasix 40 mg BID  Low salt diet  - CMP14+EGFR  2. Essential hypertension - CMP14+EGFR  3. Acute on chronic diastolic CHF (congestive heart failure) (HCC) - CMP14+EGFR  4. Type 2 diabetes mellitus with diabetic polyneuropathy, without long-term current use of insulin (HCC)  - CMP14+EGFR  5. Hypokalemia Continue K+ - CMP14+EGFR   Labs pending Health Maintenance reviewed Diet and exercise encouraged  Follow up plan: 2-4 weeks   [name], FNP

## 2021-12-27 NOTE — Patient Instructions (Signed)
Heart Failure, Diagnosis  Heart failure is a condition in which the heart has trouble pumping blood. This may mean that the heart cannot pump enough blood out to the body or that the heart does not fill up with enough blood. For some people with heart failure, fluid may back up into the lungs. There may also be swelling (edema) in the lower legs. Heart failure is usually a long-term (chronic) condition. It is important for you to take good care of yourself and follow the treatment plan from your health care provider. Different stages of heart failure have different treatment plans. The stages are: Stage A: At risk for heart failure. Having no symptoms of heart failure, but being at risk for developing heart failure. Stage B: Pre-heart failure. Having no symptoms of heart failure, but having structural changes to the heart that indicate heart failure. Stage C: Symptomatic heart failure. Having symptoms of heart failure in addition to structural changes to the heart that indicate heart failure. Stage D: Advanced heart failure. Having symptoms that interfere with daily life and frequent hospitalizations related to heart failure. What are the causes? This condition may be caused by: High blood pressure (hypertension). Hypertension causes the heart muscle to work harder than normal. Coronary artery disease, or CAD. CAD is the buildup of cholesterol and fat (plaque) in the arteries of the heart. Heart attack, also called myocardial infarction. This injures the heart muscle, making it hard for the heart to pump blood. Abnormal heart valves. The valves do not open and close properly, forcing the heart to pump harder to keep the blood flowing. Heart muscle disease, inflammation, or infection (cardiomyopathy or myocarditis). This is damage to the heart muscle. It can increase the risk of heart failure. Lung disease. The heart works harder when the lungs are not healthy. What increases the risk? The risk  of heart failure increases as a person ages. This condition is also more likely to develop in people who: Are obese. Use tobacco or nicotine products. Abuse alcohol or drugs. Have taken medicines that can damage the heart, such as chemotherapy drugs. Have any of these conditions: Diabetes. Abnormal heart rhythms. Thyroid problems. Low blood counts (anemia). Chronic kidney disease. Have a family history of heart failure. What are the signs or symptoms? Symptoms of this condition include: Shortness of breath with activity, such as when climbing stairs. A cough that does not go away. Swelling of the feet, ankles, legs, or abdomen. Losing or gaining weight for no reason. Trouble breathing when lying flat. Waking from sleep because of the need to sit up and get more air. Rapid heartbeat. Other symptoms may include: Tiredness (fatigue) and loss of energy. Feeling light-headed, dizzy, or close to fainting. Nausea or loss of appetite. Waking up more often during the night to urinate (nocturia). Confusion. How is this diagnosed? This condition is diagnosed based on: Your medical history, symptoms, and a physical exam. Blood tests. Diagnostic tests, which may include: Echocardiogram. Electrocardiogram (ECG). Chest X-ray. Exercise stress test. Cardiac MRI. Cardiac catheterization and angiogram. Radionuclide scans. How is this treated? Treatment for this condition is aimed at managing the symptoms of heart failure. Medicines Treatment may include medicines that: Help lower blood pressure by relaxing (dilating) the blood vessels. These medicines are called ACE inhibitors (angiotensin-converting enzyme), ARBs (angiotensin receptor blockers), or vasodilators. Cause the kidneys to remove salt and water from the blood through urination (diuretics). Improve heart muscle strength and prevent the heart from beating too fast (beta blockers). Increase the   force of the heartbeat  (digoxin). Lower heart rates. Certain diabetes medicines (SGLT-2 inhibitors) may also be used in treatment. Healthy behavior changes Treatment may also include making healthy lifestyle changes, such as: Reaching and staying at a healthy weight. Not using tobacco or nicotine products. Eating heart-healthy foods. Limiting or avoiding alcohol. Stopping the use of illegal drugs. Being physically active. Participating in a cardiac rehabilitation program, which is a treatment program to improve your health and well-being through exercise training, education, and counseling. Other treatments Other treatments may include: Procedures to open blocked arteries or repair damaged valves. Placing a pacemaker to improve heart function (cardiac resynchronization therapy). Placing a device to treat serious abnormal heart rhythms (implantable cardioverter defibrillator, or ICD). Placing a device to improve the pumping ability of the heart (left ventricular assist device, or LVAD). Receiving a healthy heart from a donor (heart transplant). This is done when other treatments have not helped. Follow these instructions at home: Manage other health conditions as told by your health care provider. These may include hypertension, diabetes, thyroid disease, or abnormal heart rhythms. Get ongoing education and support as needed. Learn as much as you can about heart failure. Keep all follow-up visits. This is important. Where to find more information American Heart Association: www.heart.org Centers for Disease Control and Prevention: http://www.wolf.info/ NIH National Institute on Aging: http://kim-miller.com/ Summary Heart failure is a condition in which the heart has trouble pumping blood. This condition is commonly caused by high blood pressure and other diseases of the heart and lungs. Symptoms of this condition include shortness of breath, tiredness (fatigue), nausea, and swelling of the feet, ankles, legs, or  abdomen. Treatments for this condition may include medicines, lifestyle changes, and surgery. Manage other health conditions as told by your health care provider. This information is not intended to replace advice given to you by your health care provider. Make sure you discuss any questions you have with your health care provider. Document Revised: 11/22/2020 Document Reviewed: 12/17/2019 Elsevier Patient Education  [place].

## 2021-12-28 LAB — CMP14+EGFR
ALT: 11 IU/L (ref 0–44)
AST: 9 IU/L (ref 0–40)
Albumin/Globulin Ratio: 1.6 (ref 1.2–2.2)
Albumin: 3.3 g/dL — ABNORMAL LOW (ref 3.8–4.8)
Alkaline Phosphatase: 97 IU/L (ref 44–121)
BUN/Creatinine Ratio: 7 — ABNORMAL LOW (ref 10–24)
BUN: 13 mg/dL (ref 8–27)
Bilirubin Total: 0.4 mg/dL (ref 0.0–1.2)
CO2: 33 mmol/L — ABNORMAL HIGH (ref 20–29)
Calcium: 8.8 mg/dL (ref 8.6–10.2)
Chloride: 100 mmol/L (ref 96–106)
Creatinine, Ser: 1.76 mg/dL — ABNORMAL HIGH (ref 0.76–1.27)
Globulin, Total: 2.1 g/dL (ref 1.5–4.5)
Glucose: 236 mg/dL — ABNORMAL HIGH (ref 70–99)
Potassium: 3.7 mmol/L (ref 3.5–5.2)
Sodium: 145 mmol/L — ABNORMAL HIGH (ref 134–144)
Total Protein: 5.4 g/dL — ABNORMAL LOW (ref 6.0–8.5)
eGFR: 40 mL/min/{1.73_m2} — ABNORMAL LOW (ref >59)

## 2022-01-01 DIAGNOSIS — I130 Hypertensive heart and chronic kidney disease with heart failure and stage 1 through stage 4 chronic kidney disease, or unspecified chronic kidney disease: Secondary | ICD-10-CM | POA: Diagnosis not present

## 2022-01-01 DIAGNOSIS — I4892 Unspecified atrial flutter: Secondary | ICD-10-CM | POA: Diagnosis not present

## 2022-01-01 DIAGNOSIS — I252 Old myocardial infarction: Secondary | ICD-10-CM | POA: Diagnosis not present

## 2022-01-01 DIAGNOSIS — G9340 Encephalopathy, unspecified: Secondary | ICD-10-CM | POA: Diagnosis not present

## 2022-01-01 DIAGNOSIS — F419 Anxiety disorder, unspecified: Secondary | ICD-10-CM | POA: Diagnosis not present

## 2022-01-01 DIAGNOSIS — F1320 Sedative, hypnotic or anxiolytic dependence, uncomplicated: Secondary | ICD-10-CM | POA: Diagnosis not present

## 2022-01-01 DIAGNOSIS — I700 Atherosclerosis of aorta: Secondary | ICD-10-CM | POA: Diagnosis not present

## 2022-01-01 DIAGNOSIS — J9621 Acute and chronic respiratory failure with hypoxia: Secondary | ICD-10-CM | POA: Diagnosis not present

## 2022-01-01 DIAGNOSIS — I480 Paroxysmal atrial fibrillation: Secondary | ICD-10-CM | POA: Diagnosis not present

## 2022-01-01 DIAGNOSIS — E876 Hypokalemia: Secondary | ICD-10-CM | POA: Diagnosis not present

## 2022-01-01 DIAGNOSIS — I2510 Atherosclerotic heart disease of native coronary artery without angina pectoris: Secondary | ICD-10-CM | POA: Diagnosis not present

## 2022-01-01 DIAGNOSIS — F17210 Nicotine dependence, cigarettes, uncomplicated: Secondary | ICD-10-CM | POA: Diagnosis not present

## 2022-01-01 DIAGNOSIS — I5030 Unspecified diastolic (congestive) heart failure: Secondary | ICD-10-CM | POA: Diagnosis not present

## 2022-01-01 DIAGNOSIS — J441 Chronic obstructive pulmonary disease with (acute) exacerbation: Secondary | ICD-10-CM | POA: Diagnosis not present

## 2022-01-01 DIAGNOSIS — N179 Acute kidney failure, unspecified: Secondary | ICD-10-CM | POA: Diagnosis not present

## 2022-01-01 DIAGNOSIS — Z7901 Long term (current) use of anticoagulants: Secondary | ICD-10-CM | POA: Diagnosis not present

## 2022-01-01 DIAGNOSIS — I5033 Acute on chronic diastolic (congestive) heart failure: Secondary | ICD-10-CM | POA: Diagnosis not present

## 2022-01-01 DIAGNOSIS — F32A Depression, unspecified: Secondary | ICD-10-CM | POA: Diagnosis not present

## 2022-01-01 DIAGNOSIS — N1831 Chronic kidney disease, stage 3a: Secondary | ICD-10-CM | POA: Diagnosis not present

## 2022-01-01 DIAGNOSIS — D649 Anemia, unspecified: Secondary | ICD-10-CM | POA: Diagnosis not present

## 2022-01-01 DIAGNOSIS — E1122 Type 2 diabetes mellitus with diabetic chronic kidney disease: Secondary | ICD-10-CM | POA: Diagnosis not present

## 2022-01-01 DIAGNOSIS — I890 Lymphedema, not elsewhere classified: Secondary | ICD-10-CM | POA: Diagnosis not present

## 2022-01-01 DIAGNOSIS — E440 Moderate protein-calorie malnutrition: Secondary | ICD-10-CM | POA: Diagnosis not present

## 2022-01-01 DIAGNOSIS — E1140 Type 2 diabetes mellitus with diabetic neuropathy, unspecified: Secondary | ICD-10-CM | POA: Diagnosis not present

## 2022-01-01 DIAGNOSIS — Z9981 Dependence on supplemental oxygen: Secondary | ICD-10-CM | POA: Diagnosis not present

## 2022-01-01 DIAGNOSIS — J9602 Acute respiratory failure with hypercapnia: Secondary | ICD-10-CM | POA: Diagnosis not present

## 2022-01-02 DIAGNOSIS — D649 Anemia, unspecified: Secondary | ICD-10-CM | POA: Diagnosis not present

## 2022-01-02 DIAGNOSIS — F17210 Nicotine dependence, cigarettes, uncomplicated: Secondary | ICD-10-CM | POA: Diagnosis not present

## 2022-01-02 DIAGNOSIS — J9621 Acute and chronic respiratory failure with hypoxia: Secondary | ICD-10-CM | POA: Diagnosis not present

## 2022-01-02 DIAGNOSIS — E876 Hypokalemia: Secondary | ICD-10-CM | POA: Diagnosis not present

## 2022-01-02 DIAGNOSIS — I5033 Acute on chronic diastolic (congestive) heart failure: Secondary | ICD-10-CM | POA: Diagnosis not present

## 2022-01-02 DIAGNOSIS — F32A Depression, unspecified: Secondary | ICD-10-CM | POA: Diagnosis not present

## 2022-01-02 DIAGNOSIS — Z7901 Long term (current) use of anticoagulants: Secondary | ICD-10-CM | POA: Diagnosis not present

## 2022-01-02 DIAGNOSIS — E1140 Type 2 diabetes mellitus with diabetic neuropathy, unspecified: Secondary | ICD-10-CM | POA: Diagnosis not present

## 2022-01-02 DIAGNOSIS — J441 Chronic obstructive pulmonary disease with (acute) exacerbation: Secondary | ICD-10-CM | POA: Diagnosis not present

## 2022-01-02 DIAGNOSIS — I700 Atherosclerosis of aorta: Secondary | ICD-10-CM | POA: Diagnosis not present

## 2022-01-02 DIAGNOSIS — I890 Lymphedema, not elsewhere classified: Secondary | ICD-10-CM | POA: Diagnosis not present

## 2022-01-02 DIAGNOSIS — E1122 Type 2 diabetes mellitus with diabetic chronic kidney disease: Secondary | ICD-10-CM | POA: Diagnosis not present

## 2022-01-02 DIAGNOSIS — N1831 Chronic kidney disease, stage 3a: Secondary | ICD-10-CM | POA: Diagnosis not present

## 2022-01-02 DIAGNOSIS — I4892 Unspecified atrial flutter: Secondary | ICD-10-CM | POA: Diagnosis not present

## 2022-01-02 DIAGNOSIS — N179 Acute kidney failure, unspecified: Secondary | ICD-10-CM | POA: Diagnosis not present

## 2022-01-02 DIAGNOSIS — J9602 Acute respiratory failure with hypercapnia: Secondary | ICD-10-CM | POA: Diagnosis not present

## 2022-01-02 DIAGNOSIS — Z9981 Dependence on supplemental oxygen: Secondary | ICD-10-CM | POA: Diagnosis not present

## 2022-01-02 DIAGNOSIS — F1320 Sedative, hypnotic or anxiolytic dependence, uncomplicated: Secondary | ICD-10-CM | POA: Diagnosis not present

## 2022-01-02 DIAGNOSIS — I2510 Atherosclerotic heart disease of native coronary artery without angina pectoris: Secondary | ICD-10-CM | POA: Diagnosis not present

## 2022-01-02 DIAGNOSIS — F419 Anxiety disorder, unspecified: Secondary | ICD-10-CM | POA: Diagnosis not present

## 2022-01-02 DIAGNOSIS — I130 Hypertensive heart and chronic kidney disease with heart failure and stage 1 through stage 4 chronic kidney disease, or unspecified chronic kidney disease: Secondary | ICD-10-CM | POA: Diagnosis not present

## 2022-01-02 DIAGNOSIS — I252 Old myocardial infarction: Secondary | ICD-10-CM | POA: Diagnosis not present

## 2022-01-02 DIAGNOSIS — I480 Paroxysmal atrial fibrillation: Secondary | ICD-10-CM | POA: Diagnosis not present

## 2022-01-02 DIAGNOSIS — E440 Moderate protein-calorie malnutrition: Secondary | ICD-10-CM | POA: Diagnosis not present

## 2022-01-02 DIAGNOSIS — G9340 Encephalopathy, unspecified: Secondary | ICD-10-CM | POA: Diagnosis not present

## 2022-01-03 ENCOUNTER — Ambulatory Visit: Payer: Medicare HMO | Admitting: *Deleted

## 2022-01-03 DIAGNOSIS — I509 Heart failure, unspecified: Secondary | ICD-10-CM

## 2022-01-03 DIAGNOSIS — J439 Emphysema, unspecified: Secondary | ICD-10-CM | POA: Diagnosis not present

## 2022-01-03 DIAGNOSIS — E1142 Type 2 diabetes mellitus with diabetic polyneuropathy: Secondary | ICD-10-CM

## 2022-01-03 DIAGNOSIS — I10 Essential (primary) hypertension: Secondary | ICD-10-CM

## 2022-01-03 NOTE — Patient Instructions (Signed)
Visit Information  Thank you for taking time to visit with me today. Please don't hesitate to contact me if I can be of assistance to you before your next scheduled telephone appointment.  Following are the goals we discussed today:  Patient will self administer medications as prescribed Patient will attend all scheduled provider appointments Patient will call provider office for new concerns or questions Check and record blood pressure daily. Call PCP with any readings outside of recommended range.  Check and record weight each morning after urinating. Take list with you to next PCP appointment.  Call PCP if weight increases by more than 3lbs overnight or 5 lbs in one week Check blood sugar daily and as needed Take blood sugar log to PCP appt and call if readings are outside of recommended range Scheduled appointment with Care Coordinator   Your next appointment is by telephone on 01/23/22 at 1:00 with Jackelyn Poling, RN Care Coordinator  Please call the care guide team at [phone] if you need to cancel or reschedule your appointment.   If you are experiencing a Mental Health or [place] or need someone to talk to, please go to [facility] [address], [place] [phone]) call the [place]: [phone]   Patient verbalizes understanding of instructions and care plan provided today and agrees to view in [place]. Active MyChart status and patient understanding of how to access instructions and care plan via MyChart confirmed with patient.     Chong Sicilian, BSN, RN-BC Embedded Chronic Care Manager [facility] / [place] [facility] Direct Dial: [phone]

## 2022-01-03 NOTE — Chronic Care Management (AMB) (Signed)
Care Management    RN Visit Note  01/03/2022 Name: Ronald [name] MRN: [id number] DOB: [birth year]  Subjective: Ronald [name] is a 76 y.o. year old male who is a primary care patient of Ronald Balloon, FNP. The care management team was consulted for assistance with disease management and care coordination needs.    Engaged with patient by telephone for follow up visit in response to provider referral for case management and/or care coordination services.   Consent to Services:   Ronald [name] was given information about Care Management services today including:  Care Management services includes personalized support from designated clinical staff supervised by his physician, including individualized plan of care and coordination with other care providers 24/7 contact phone numbers for assistance for urgent and routine care needs. The patient may stop case management services at any time by phone call to the office staff.  Patient agreed to services and consent obtained.   Assessment: Review of patient past medical history, allergies, medications, health status, including review of consultants reports, laboratory and other test data, was performed as part of comprehensive evaluation and provision of chronic care management services.   SDOH (Social Determinants of Health) assessments and interventions performed:    Care Plan  Allergies  Allergen Reactions   Gabapentin Anxiety    Unknown reaction   Metformin And Related Rash    Outpatient Encounter Medications as of 01/03/2022  Medication Sig   acetaminophen (TYLENOL) 325 MG tablet Take 2 tablets (650 mg total) by mouth every 6 (six) hours as needed for mild pain (or Fever >/= 101).   albuterol (VENTOLIN HFA) 108 (90 Base) MCG/ACT inhaler Inhale 2 puffs into the lungs every 6 (six) hours as needed for wheezing or shortness of breath.   ALPRAZolam (XANAX) 0.25 MG tablet Take 1 tablet (0.25 mg total) by mouth 2 (two) times daily as needed  for anxiety.   amiodarone (PACERONE) 200 MG tablet Take 1 tablet by mouth once daily (Patient taking differently: Take 200 mg by mouth daily.)   aspirin EC 81 MG EC tablet Take 1 tablet (81 mg total) by mouth daily. Swallow whole.   bisoprolol (ZEBETA) 5 MG tablet Take 1 tablet (5 mg total) by mouth daily. In Place of Metoprolol   budesonide (PULMICORT) 0.5 MG/2ML nebulizer solution Take 2 mLs (0.5 mg total) by nebulization 2 (two) times daily.   dextromethorphan-guaiFENesin (MUCINEX DM) 30-600 MG 12hr tablet Take 1 tablet by mouth 2 (two) times daily.   ELIQUIS 5 MG TABS tablet Take 1 tablet by mouth twice daily   empagliflozin (JARDIANCE) 10 MG TABS tablet Take 1 tablet (10 mg total) by mouth daily before breakfast.   furosemide (LASIX) 40 MG tablet Take 1 tablet (40 mg total) by mouth 2 (two) times daily.   glipiZIDE (GLUCOTROL) 5 MG tablet Take 1 tablet (5 mg total) by mouth daily before breakfast for 5 days. Take This Only while Taking Prednisone   glucose blood (ONE TOUCH ULTRA TEST) test strip USE TO CHECK GLUCOSE ONCE DAILY   ipratropium-albuterol (DUONEB) 0.5-2.5 (3) MG/3ML SOLN Take 3 mLs by nebulization every 6 (six) hours as needed (asthma).   isosorbide mononitrate (ISMO) 10 MG tablet Take 1 tablet by mouth twice daily (Patient taking differently: Take 10 mg by mouth 2 (two) times daily.)   losartan (COZAAR) 100 MG tablet Take 1 tablet (100 mg total) by mouth daily.   nicotine (NICODERM CQ - DOSED IN MG/24 HOURS) 21 mg/24hr patch Place 1  patch (21 mg total) onto the skin daily for 28 days.   nitroGLYCERIN (NITROSTAT) 0.4 MG SL tablet Place 1 tablet (0.4 mg total) under the tongue every 5 (five) minutes as needed for chest pain.   omeprazole (PRILOSEC) 20 MG capsule Take 1 capsule (20 mg total) by mouth daily.   OXYGEN Inhale 3 L into the lungs at bedtime as needed (shortness of breath).   polyethylene glycol (MIRALAX / GLYCOLAX) 17 g packet Take 17 g by mouth daily.   potassium  chloride (KLOR-CON) 20 MEQ packet Take 20 mEq by mouth 2 (two) times daily.   rosuvastatin (CRESTOR) 20 MG tablet Take 1 tablet (20 mg total) by mouth daily.   [DISCONTINUED] potassium chloride SA (KLOR-CON M) 20 MEQ tablet Take 1 tablet (20 mEq total) by mouth 2 (two) times daily.   No facility-administered encounter medications on file as of 01/03/2022.    Patient Active Problem List   Diagnosis Date Noted   COPD with acute exacerbation ([place]) 12/07/2021   Respiratory failure with hypoxia ([place]) 12/06/2021   Prolonged QT interval 11/23/2021   Acute exacerbation of CHF (congestive heart failure) ([place]) 11/23/2021   Acute on chronic diastolic CHF (congestive heart failure) ([place]) 11/22/2021   Non-ST elevation (NSTEMI) myocardial infarction ([place]) 09/01/2021   Respiratory failure ([place]) 08/31/2021   Acute respiratory failure ([place]) 07/10/2021   Sepsis due to pneumonia ([place]) 07/10/2021   Pulmonary nodule 07/10/2021   Malnutrition of moderate degree 05/13/2021   Atrial flutter ([place]) 05/01/2021   Shock circulatory ([place]) 05/01/2021   Acute hypercapnic respiratory failure ([place]) 05/01/2021   Elevated brain natriuretic peptide (BNP) level 12/21/2020   Elevated troponin 12/21/2020   Hypokalemia 12/21/2020   Hyperglycemia due to diabetes mellitus ([place]) 12/21/2020   Mixed hyperlipidemia 12/21/2020   Acute on chronic respiratory failure with hypoxia ([place]) 12/21/2020   Chest pain 05/23/2020   Controlled substance agreement signed 04/03/2020   Benzodiazepine dependence ([place]) 04/03/2020   Dysphagia 02/27/2020   Tracheal stenosis 12/09/2019   Lymphedema 04/01/2019   CHF (congestive heart failure) ([place]) 08/27/2018   Aortic atherosclerosis ([place]) 05/11/2017   Depression 05/05/2017   GERD (gastroesophageal reflux disease) 01/16/2017   Diabetic neuropathy ([place]) 01/16/2017   Anterolisthesis 12/04/2016   History of MI (myocardial infarction) 12/04/2016   OSA (obstructive sleep apnea) 12/04/2016   Closed  fracture dislocation of lumbar spine ([place]) 07/25/2016   Closed fracture of body of sternum 07/25/2016   Multiple fractures of cervical spine ([place]) 07/25/2016   Anxiety 02/14/2016   Chronic respiratory failure with hypoxia ([place]) 07/19/2014   Essential hypertension 07/19/2014   Cigarette smoker 07/19/2014   Coronary artery disease 06/22/2014   Diabetes mellitus ([place]) 06/22/2014   COPD exacerbation ([place]) 06/22/2014   Tobacco abuse 06/22/2014   Acute encephalopathy 06/22/2014   Diabetes mellitus type 2 in obese ([place]) 05/11/2014   Simple chronic bronchitis ([place]) 05/11/2014    Conditions to be addressed/monitored: CHF and DMII  Care Plan : [facility] Care Plan  Updates made by Ilean China, RN since 01/03/2022 12:00 AM  Completed 01/03/2022   Problem: Chronic Disease Management Needs Resolved 01/03/2022  Priority: Medium  Onset Date: 01/09/2021     Goal: Care Management and Care Coordination Needs Associated with COPD, nicotine dependence, DM, and HTN Completed 01/03/2022  Start Date: 01/09/2021  This Visit's Progress: On track  Recent Progress: On track  Priority: Medium  Note:   Closing RN Care Management Care Plans and transitioning to Care Coordination.   Current Barriers:  Chronic Disease Management support and education needs related to HTN, COPD, and DMII Non-adherence to smoking cessation recommendation  Non-adherence to prescribed oxygen treatment  RNCM Clinical Goal(s):  Patient will work with RN Case Manager to address needs related to HTN, COPD, and DMII and Limited social support and nicotine dependence  take all medications exactly as prescribed and will call provider for medication related questions through collaboration with RN Care manager, provider, and care team.   Interventions: 1:1 collaboration with primary care provider regarding development and update of comprehensive plan of care as evidenced by provider attestation and co-signature Inter-disciplinary care team  collaboration (see longitudinal plan of care) Evaluation of current treatment plan related to  self management and patient's adherence to plan as established by provider; Provided with RN Care Manager contact number and encouraged to reach out as needed   Heart Failure Interventions:  (Status: New goal.)  Long Term Goal  Wt Readings from Last 3 Encounters:  12/27/21 174 lb (78.9 kg)  12/17/21 182 lb 6 oz (82.7 kg)  12/10/21 173 lb 3.2 oz (78.6 kg)  Reviewed chart including hospital and office notes and pertinent lab and imaging reports Basic overview and discussion of pathophysiology of Heart Failure reviewed Provided education on low sodium diet Provided education about placing scale on hard, flat surface Advised patient to weigh each morning after emptying bladder Discussed importance of daily weight and advised patient to weigh and record daily Reviewed role of diuretics in prevention of fluid overload and management of heart failure Discussed the importance of keeping all appointments with provider Assessed social determinant of health barriers Appointment scheduled with Care Coordination nurse for follow-up   Advised to check and record blood pressure daily and to take list to next PCP visit Advised to take list of daily weights to PCP visit Encouraged to call PCP with any weight gain of 3 lbs overnight or 5 lbs in one week Discussed home blood pressure readings  Encouraged to check and record blood pressure daily Advised to call PCP with any blood pressure readings outside of recommended range Reviewed and discussed recent medication changes   Diabetes:  (Status: Goal on Track (progressing): YES.) Long Term Goal  Lab Results  Component Value Date   HGBA1C 8.2 (H) 12/17/2021    Assessed patient's understanding of A1c goal: <7% Provided education to patient about basic DM disease process; Reviewed medications with patient and discussed importance of medication adherence;         Counseled on importance of regular laboratory monitoring as prescribed;        Review of patient status, including review of consultants reports, relevant laboratory and other test results, and medications completed;       Assessed social determinant of health barriers;        Discussed the recent addition of Jardiance at last office visit.  Encouraged to check blood sugar daily and as needed and to write it down Asked to take blood pressure log to PCP appointment for review Discussed that blood sugar can become unstable and increase when sick and hospitalized  COPD: (Status: Condition stable. Not addressed this visit.) Reviewed and discussed medications and affordability. Receiving Trelegy samples.  Collaborated with PCP regarding need for Trelegy samples Chart reviewed including recent office notes, hosp notes, imaging reports, and lab results Reinforced need to schedule 6 month f/u with pulmonary, Dr Melvyn Novas Reviewed upcoming cardiology appointment. Verified transportation.  Discussed use of oxygen. Uses 2 liters via [place] at night and while  napping. Has a floor concentrator. He does not use it otherwise. Encouraged to use continuously as prescribed. Understand that portable tanks are large and difficult to manage and he is limited by the floor concentrator. He is not a candidate for the portable concentrator because he doesn't use the tanks often enough.  Discussed current status of respiratory conditions. Seems to be doing well per significant other.  Advised to reach out to PCP with any new or worsening symptoms or to call 911 for any severe symptoms Encouraged to monitor and record oxygen level with pulse ox and to call PCP or pulmonologist with any readings below 90% Discussed mobility and ability to perform ADLs. Limited by shortness of breath. Advised that home health physical therapy has been ordered to help with strengthening. May need to consider pulmonary rehab. Can talk with  pulmonologist or PCP regarding this.    Urinary Incontinence:  (Status: Condition stable. Not addressed this visit.) Discussed current management of incontinence Mainly nocturia and some urge incontinence Reviewed upcoming appointment with urologist in [place] and verified transportation.  Previously recommended condom catheter in addition to current supplies. Patient was able to purchase those and was givne instructions by durable medical equipment supplier. Has been sucessfully using them.  Recommended to empty bladder at least every 4 hours and to limit liquid intake several hours before bed Encouraged to reach out to [facility] as needed   Smoking Cessation: (Status: Condition stable. Not addressed this visit.) Reviewed smoking history:  currently smoking 1.5 ppd Reports smoking within 30 minutes of waking up Wife would like for him to stop smoking but he is not motivated at this time He does have nicotine replacement options at home but doesn't use them. Used patches in the hospital.  Verified that he does not smoke near to or while wearing oxygen  Patient has talked with [facility] [facility] and did use some of the patches that they sent Encouraged to continue considering cessation and work towards removing smoking triggers  Patient Goals/Self-Care Activities: Patient will self administer medications as prescribed Patient will attend all scheduled provider appointments Patient will call provider office for new concerns or questions Check and record blood pressure daily. Call PCP with any readings outside of recommended range.  Check and record weight each morning after urinating. Take list with you to next PCP appointment.  Call PCP if weight increases by more than 3lbs overnight or 5 lbs in one week Check blood sugar daily and as needed Take blood sugar log to PCP appt and call if readings are outside of recommended range Scheduled appointment with Care Coordinator   Plan: Telephone  follow up appointment with care management team member scheduled for:  01/23/22 at 1pm with Jackelyn Poling, RN Care Coordinator The patient has been provided with contact information for the care management team and has been advised to call with any health related questions or concerns.   Chong Sicilian, BSN, RN-BC Embedded Chronic Care Manager [facility] [facility] Direct Dial: [phone]

## 2022-01-06 ENCOUNTER — Telehealth: Payer: Self-pay | Admitting: Internal Medicine

## 2022-01-06 DIAGNOSIS — I700 Atherosclerosis of aorta: Secondary | ICD-10-CM | POA: Diagnosis not present

## 2022-01-06 DIAGNOSIS — I480 Paroxysmal atrial fibrillation: Secondary | ICD-10-CM | POA: Diagnosis not present

## 2022-01-06 DIAGNOSIS — J9602 Acute respiratory failure with hypercapnia: Secondary | ICD-10-CM | POA: Diagnosis not present

## 2022-01-06 DIAGNOSIS — J9621 Acute and chronic respiratory failure with hypoxia: Secondary | ICD-10-CM | POA: Diagnosis not present

## 2022-01-06 DIAGNOSIS — E440 Moderate protein-calorie malnutrition: Secondary | ICD-10-CM | POA: Diagnosis not present

## 2022-01-06 DIAGNOSIS — F17210 Nicotine dependence, cigarettes, uncomplicated: Secondary | ICD-10-CM | POA: Diagnosis not present

## 2022-01-06 DIAGNOSIS — J441 Chronic obstructive pulmonary disease with (acute) exacerbation: Secondary | ICD-10-CM | POA: Diagnosis not present

## 2022-01-06 DIAGNOSIS — I4892 Unspecified atrial flutter: Secondary | ICD-10-CM | POA: Diagnosis not present

## 2022-01-06 DIAGNOSIS — I890 Lymphedema, not elsewhere classified: Secondary | ICD-10-CM | POA: Diagnosis not present

## 2022-01-06 DIAGNOSIS — I5033 Acute on chronic diastolic (congestive) heart failure: Secondary | ICD-10-CM | POA: Diagnosis not present

## 2022-01-06 DIAGNOSIS — I130 Hypertensive heart and chronic kidney disease with heart failure and stage 1 through stage 4 chronic kidney disease, or unspecified chronic kidney disease: Secondary | ICD-10-CM | POA: Diagnosis not present

## 2022-01-06 DIAGNOSIS — F1320 Sedative, hypnotic or anxiolytic dependence, uncomplicated: Secondary | ICD-10-CM | POA: Diagnosis not present

## 2022-01-06 DIAGNOSIS — N179 Acute kidney failure, unspecified: Secondary | ICD-10-CM | POA: Diagnosis not present

## 2022-01-06 DIAGNOSIS — E1122 Type 2 diabetes mellitus with diabetic chronic kidney disease: Secondary | ICD-10-CM | POA: Diagnosis not present

## 2022-01-06 DIAGNOSIS — N1831 Chronic kidney disease, stage 3a: Secondary | ICD-10-CM | POA: Diagnosis not present

## 2022-01-06 DIAGNOSIS — E876 Hypokalemia: Secondary | ICD-10-CM | POA: Diagnosis not present

## 2022-01-06 DIAGNOSIS — G9340 Encephalopathy, unspecified: Secondary | ICD-10-CM | POA: Diagnosis not present

## 2022-01-06 DIAGNOSIS — Z9981 Dependence on supplemental oxygen: Secondary | ICD-10-CM | POA: Diagnosis not present

## 2022-01-06 DIAGNOSIS — I252 Old myocardial infarction: Secondary | ICD-10-CM | POA: Diagnosis not present

## 2022-01-06 DIAGNOSIS — F32A Depression, unspecified: Secondary | ICD-10-CM | POA: Diagnosis not present

## 2022-01-06 DIAGNOSIS — D649 Anemia, unspecified: Secondary | ICD-10-CM | POA: Diagnosis not present

## 2022-01-06 DIAGNOSIS — F419 Anxiety disorder, unspecified: Secondary | ICD-10-CM | POA: Diagnosis not present

## 2022-01-06 DIAGNOSIS — E1140 Type 2 diabetes mellitus with diabetic neuropathy, unspecified: Secondary | ICD-10-CM | POA: Diagnosis not present

## 2022-01-06 DIAGNOSIS — I2510 Atherosclerotic heart disease of native coronary artery without angina pectoris: Secondary | ICD-10-CM | POA: Diagnosis not present

## 2022-01-06 DIAGNOSIS — Z7901 Long term (current) use of anticoagulants: Secondary | ICD-10-CM | POA: Diagnosis not present

## 2022-01-06 NOTE — Telephone Encounter (Signed)
Tried calling daughter in law, Ronald [name] ok per DPR- left detailed msg to call for appt.      Bradley Ferris, NP  Rosana Berger, [place] Phone Number: [phone]   Please call the wife, son or daughter in law to schedule the visit. I have informed daughter in law (who is a Marine scientist) that he would need one.  Rhonda        Previous Messages    ----- Message -----  From: Rosana Berger, CMA  Sent: 12/31/2021   4:12 PM EDT  To: Bradley Ferris, NP; Tanda Rockers, MD  Subject: RE: patient requests portable O2 concentrator   I am happy to order for him, but he will need appt first to qualify. We do not have qualifying o2 documentation since 2021.  ----- Message -----  From: Tanda Rockers, MD  Sent: 12/31/2021   3:32 PM EDT  To: Bradley Ferris, NP; Rosana Berger, CMA  Subject: RE: patient requests portable O2 concentrator   Magda Paganini :  He needs a best fit evaluation for portable 02 and f/u with me prn  ----- Message -----  From: Bradley Ferris, NP  Sent: 12/24/2021   1:32 PM EDT  To: Tanda Rockers, MD  Subject: patient requests portable O2 concentrator       Dr. Melvyn Novas  I am seeing one of your patient, Jasim Harari, who has not has a f/u with you since 12/22. His DIL, Ronald [name], who is a nurse is to call to make him an appt. He needs his pulmonary meds looked at and adjusted, I think. He does continue to smoke with no desire to stop.  He does drive short distances when he feels well and is alone with no one to carry his E tanks. He requests you write him a script for a portable concentrator or at least something he can handle. His O2 sat was 82% on RA yesterday and up to 99% on 3l/min [place].  Let me know if you have any questions,   Dominica Severin, [facility]  Palliative Care Nurse Practitioner  [facility], a Division of [facility]

## 2022-01-07 ENCOUNTER — Other Ambulatory Visit: Payer: Self-pay | Admitting: Family

## 2022-01-08 DIAGNOSIS — I700 Atherosclerosis of aorta: Secondary | ICD-10-CM | POA: Diagnosis not present

## 2022-01-08 DIAGNOSIS — G9340 Encephalopathy, unspecified: Secondary | ICD-10-CM | POA: Diagnosis not present

## 2022-01-08 DIAGNOSIS — F32A Depression, unspecified: Secondary | ICD-10-CM | POA: Diagnosis not present

## 2022-01-08 DIAGNOSIS — E1140 Type 2 diabetes mellitus with diabetic neuropathy, unspecified: Secondary | ICD-10-CM | POA: Diagnosis not present

## 2022-01-08 DIAGNOSIS — F1320 Sedative, hypnotic or anxiolytic dependence, uncomplicated: Secondary | ICD-10-CM | POA: Diagnosis not present

## 2022-01-08 DIAGNOSIS — I4892 Unspecified atrial flutter: Secondary | ICD-10-CM | POA: Diagnosis not present

## 2022-01-08 DIAGNOSIS — Z9981 Dependence on supplemental oxygen: Secondary | ICD-10-CM | POA: Diagnosis not present

## 2022-01-08 DIAGNOSIS — I890 Lymphedema, not elsewhere classified: Secondary | ICD-10-CM | POA: Diagnosis not present

## 2022-01-08 DIAGNOSIS — F419 Anxiety disorder, unspecified: Secondary | ICD-10-CM | POA: Diagnosis not present

## 2022-01-08 DIAGNOSIS — J9602 Acute respiratory failure with hypercapnia: Secondary | ICD-10-CM | POA: Diagnosis not present

## 2022-01-08 DIAGNOSIS — I2510 Atherosclerotic heart disease of native coronary artery without angina pectoris: Secondary | ICD-10-CM | POA: Diagnosis not present

## 2022-01-08 DIAGNOSIS — D649 Anemia, unspecified: Secondary | ICD-10-CM | POA: Diagnosis not present

## 2022-01-08 DIAGNOSIS — I252 Old myocardial infarction: Secondary | ICD-10-CM | POA: Diagnosis not present

## 2022-01-08 DIAGNOSIS — I5033 Acute on chronic diastolic (congestive) heart failure: Secondary | ICD-10-CM | POA: Diagnosis not present

## 2022-01-08 DIAGNOSIS — N1831 Chronic kidney disease, stage 3a: Secondary | ICD-10-CM | POA: Diagnosis not present

## 2022-01-08 DIAGNOSIS — J9621 Acute and chronic respiratory failure with hypoxia: Secondary | ICD-10-CM | POA: Diagnosis not present

## 2022-01-08 DIAGNOSIS — E440 Moderate protein-calorie malnutrition: Secondary | ICD-10-CM | POA: Diagnosis not present

## 2022-01-08 DIAGNOSIS — N179 Acute kidney failure, unspecified: Secondary | ICD-10-CM | POA: Diagnosis not present

## 2022-01-08 DIAGNOSIS — J441 Chronic obstructive pulmonary disease with (acute) exacerbation: Secondary | ICD-10-CM | POA: Diagnosis not present

## 2022-01-08 DIAGNOSIS — F17210 Nicotine dependence, cigarettes, uncomplicated: Secondary | ICD-10-CM | POA: Diagnosis not present

## 2022-01-08 DIAGNOSIS — E1122 Type 2 diabetes mellitus with diabetic chronic kidney disease: Secondary | ICD-10-CM | POA: Diagnosis not present

## 2022-01-08 DIAGNOSIS — E876 Hypokalemia: Secondary | ICD-10-CM | POA: Diagnosis not present

## 2022-01-08 DIAGNOSIS — Z7901 Long term (current) use of anticoagulants: Secondary | ICD-10-CM | POA: Diagnosis not present

## 2022-01-08 DIAGNOSIS — I130 Hypertensive heart and chronic kidney disease with heart failure and stage 1 through stage 4 chronic kidney disease, or unspecified chronic kidney disease: Secondary | ICD-10-CM | POA: Diagnosis not present

## 2022-01-08 DIAGNOSIS — I480 Paroxysmal atrial fibrillation: Secondary | ICD-10-CM | POA: Diagnosis not present

## 2022-01-16 ENCOUNTER — Ambulatory Visit (INDEPENDENT_AMBULATORY_CARE_PROVIDER_SITE_OTHER): Payer: Medicare HMO

## 2022-01-16 VITALS — Wt 174.0 lb

## 2022-01-16 DIAGNOSIS — Z0000 Encounter for general adult medical examination without abnormal findings: Secondary | ICD-10-CM

## 2022-01-16 NOTE — Progress Notes (Signed)
Subjective:   Ronald [name] is a 76 y.o. male who presents for Medicare Annual/Subsequent preventive examination.  Virtual Visit via Telephone Note  I connected with  Ronald [name] on 01/16/22 at 10:30 AM EDT by telephone and verified that I am speaking with the correct person using two identifiers.  Location: Patient: Home Provider: [facility] Persons participating in the virtual visit: patient, significant other - Ronald [name], daughter in law - Ronald [name], and Nurse Health Advisor   I discussed the limitations, risks, security and privacy concerns of performing an evaluation and management service by telephone and the availability of in person appointments. The patient expressed understanding and agreed to proceed.  Interactive audio and video telecommunications were attempted between this nurse and patient, however failed, due to patient having technical difficulties OR patient did not have access to video capability.  We continued and completed visit with audio only.  Some vital signs may be absent or patient reported.   Ronald [name] E Dandy Lazaro, LPN   Review of Systems     Cardiac Risk Factors include: advanced age (>22mn, >>84women);male gender;sedentary lifestyle;smoking/ tobacco exposure;hypertension;dyslipidemia;diabetes mellitus;Other (see comment), Risk factor comments: CAD, hx of MI, A.Fib, COPD, OSA, chronic respiratory failure     Objective:    Today's Vitals   01/16/22 1034  Weight: 174 lb (78.9 kg)   Body mass index is 25.7 kg/m.     01/16/2022   10:52 AM 12/06/2021   12:13 PM 11/23/2021    3:38 PM 11/22/2021    8:24 PM 08/31/2021    1:35 PM 07/12/2021    5:57 PM 07/10/2021    3:09 AM  Advanced Directives  Does Patient Have a Medical Advance Directive? Yes No  No No  No  Type of [place]        Does patient want to make changes to medical advance directive? No - Patient declined        Copy of [place] Chart? Yes  - validated most recent copy scanned in chart (See row information)        Would patient like information on creating a medical advance directive?  No - Patient declined No - Patient declined  No - Patient declined No - Patient declined     Current Medications (verified) Outpatient Encounter Medications as of 01/16/2022  Medication Sig   acetaminophen (TYLENOL) 325 MG tablet Take 2 tablets (650 mg total) by mouth every 6 (six) hours as needed for mild pain (or Fever >/= 101).   albuterol (VENTOLIN HFA) 108 (90 Base) MCG/ACT inhaler Inhale 2 puffs into the lungs every 6 (six) hours as needed for wheezing or shortness of breath.   ALPRAZolam (XANAX) 0.25 MG tablet Take 1 tablet (0.25 mg total) by mouth 2 (two) times daily as needed for anxiety.   amiodarone (PACERONE) 200 MG tablet Take 1 tablet by mouth once daily (Patient taking differently: Take 200 mg by mouth daily.)   aspirin EC 81 MG EC tablet Take 1 tablet (81 mg total) by mouth daily. Swallow whole.   bisoprolol (ZEBETA) 5 MG tablet Take 1 tablet (5 mg total) by mouth daily. In Place of Metoprolol   budesonide (PULMICORT) 0.5 MG/2ML nebulizer solution Take 2 mLs (0.5 mg total) by nebulization 2 (two) times daily.   dextromethorphan-guaiFENesin (MUCINEX DM) 30-600 MG 12hr tablet Take 1 tablet by mouth 2 (two) times daily.   ELIQUIS 5 MG TABS tablet Take 1 tablet by mouth twice  daily   empagliflozin (JARDIANCE) 10 MG TABS tablet Take 1 tablet (10 mg total) by mouth daily before breakfast.   furosemide (LASIX) 40 MG tablet Take 1 tablet (40 mg total) by mouth 2 (two) times daily.   glucose blood (ONE TOUCH ULTRA TEST) test strip USE TO CHECK GLUCOSE ONCE DAILY   ipratropium-albuterol (DUONEB) 0.5-2.5 (3) MG/3ML SOLN Take 3 mLs by nebulization every 6 (six) hours as needed (asthma).   isosorbide mononitrate (ISMO) 10 MG tablet Take 1 tablet by mouth twice daily (Patient taking differently: Take 10 mg by mouth 2 (two) times daily.)   losartan  (COZAAR) 100 MG tablet Take 1 tablet (100 mg total) by mouth daily.   nitroGLYCERIN (NITROSTAT) 0.4 MG SL tablet Place 1 tablet (0.4 mg total) under the tongue every 5 (five) minutes as needed for chest pain.   omeprazole (PRILOSEC) 20 MG capsule Take 1 capsule (20 mg total) by mouth daily.   OXYGEN Inhale 3 L into the lungs at bedtime as needed (shortness of breath).   polyethylene glycol (MIRALAX / GLYCOLAX) 17 g packet Take 17 g by mouth daily.   potassium chloride (KLOR-CON) 20 MEQ packet Take 20 mEq by mouth 2 (two) times daily.   rosuvastatin (CRESTOR) 20 MG tablet Take 1 tablet (20 mg total) by mouth daily.   [DISCONTINUED] amLODipine (NORVASC) 5 MG tablet Take 5 mg by mouth daily.   [DISCONTINUED] glipiZIDE (GLUCOTROL) 5 MG tablet Take 1 tablet (5 mg total) by mouth daily before breakfast for 5 days. Take This Only while Taking Prednisone   [DISCONTINUED] potassium chloride SA (KLOR-CON M) 20 MEQ tablet Take 1 tablet (20 mEq total) by mouth 2 (two) times daily.   No facility-administered encounter medications on file as of 01/16/2022.    Allergies (verified) Gabapentin and Metformin and related   History: Past Medical History:  Diagnosis Date   Anxiety    Asthma    Atrial flutter ([place]) 04/2021   Chronic diastolic CHF (congestive heart failure) (HCC)    Chronic lower back pain    COPD (chronic obstructive pulmonary disease) (HCC)    Coronary artery disease    a. NSTEMI 05/2014 s/p DESx2 to LAD at [facility].   Depression    GERD (gastroesophageal reflux disease)    High cholesterol    History of tracheostomy    Hypertension    NSTEMI (non-ST elevated myocardial infarction) ([place]) 05/2014   with stent placement   PAF (paroxysmal atrial fibrillation) ([place])    Sleep apnea    Stroke ([place]) 2017   anyeusym    TIA (transient ischemic attack)    "they say I've had some mini strokes; don't know when"; denies residual on 06/22/2014)   Tobacco abuse    Type II diabetes mellitus ([place])     Ulcerative colitis ([place])    Past Surgical History:  Procedure Laterality Date   APPENDECTOMY     BIOPSY  07/20/2020   Procedure: BIOPSY;  Surgeon: Harvel Quale, MD;  [facility];  Service: Gastroenterology;;   CARDIAC CATHETERIZATION  [phone]   CHOLECYSTECTOMY     COLONOSCOPY WITH PROPOFOL N/A 07/20/2020   Procedure: COLONOSCOPY WITH PROPOFOL;  Surgeon: Harvel Quale, MD;  [facility];  Service: Gastroenterology;  Laterality: N/A;  1:15   CORONARY ANGIOPLASTY WITH STENT PLACEMENT  05/2014   "2"   ESOPHAGEAL DILATION N/A 07/20/2020   Procedure: ESOPHAGEAL DILATION;  Surgeon: Harvel Quale, MD;  [facility];  Service: Gastroenterology;  Laterality: N/A;   ESOPHAGOGASTRODUODENOSCOPY (EGD) WITH PROPOFOL N/A 07/20/2020   Procedure: ESOPHAGOGASTRODUODENOSCOPY (EGD) WITH PROPOFOL;  Surgeon: Harvel Quale, MD;  [facility];  Service: Gastroenterology;  Laterality: N/A;   IR GASTROSTOMY TUBE MOD SED  06/04/2021   IR GASTROSTOMY TUBE REMOVAL  07/24/2021   LEFT HEART CATH AND CORONARY ANGIOGRAPHY N/A 05/25/2020   Procedure: LEFT HEART CATH AND CORONARY ANGIOGRAPHY;  Surgeon: Martinique, Peter M, MD;  Location: [facility];  Service: Cardiovascular;  Laterality: N/A;   POLYPECTOMY  07/20/2020   Procedure: POLYPECTOMY INTESTINAL;  Surgeon: Harvel Quale, MD;  [facility];  Service: Gastroenterology;;   TUMOR EXCISION Right ~ 1999   "side of my upper head"   Family History  Problem Relation Age of Onset   CAD Father    Lung cancer Brother        smoked   Cancer Brother        lung   Leukemia Sister    Dementia Sister    Stroke Mother    Emphysema Sister    Cancer Brother        lung   Social History   Socioeconomic History   Marital status: Significant Other    Spouse name: Ronald [name]   Number of children: 1   Years of education: Not on file   Highest education  level: Not on file  Occupational History   Occupation: Retired    Comment: Aeronautical engineer  Tobacco Use   Smoking status: Every Day    Packs/day: 0.50    Years: 48.00    Total pack years: 24.00    Types: Cigarettes    Start date: 02/12/1966    Last attempt to quit: 07/03/2021    Years since quitting: 0.5   Smokeless tobacco: Never   Tobacco comments:    smokes 5-6 cigarettes per day 06/07/2020  Vaping Use   Vaping Use: Never used  Substance and Sexual Activity   Alcohol use: Not Currently    Alcohol/week: 0.0 standard drinks of alcohol   Drug use: No   Sexual activity: Never  Other Topics Concern   Not on file  Social History Narrative   Staying with his ex-wife, Ronald [name]   Has 2 children (today he told me one child 01/15/21)   Social Determinants of Health   Financial Resource Strain: Low Risk  (01/15/2021)   Overall Financial Resource Strain (CARDIA)    Difficulty of Paying Living Expenses: Not very hard  Food Insecurity: No Food Insecurity (01/15/2021)   Hunger Vital Sign    Worried About Running Out of Food in the Last Year: Never true    [place] in the Last Year: Never true  Transportation Needs: No Transportation Needs (01/15/2021)   PRAPARE - Hydrologist (Medical): No    Lack of Transportation (Non-Medical): No  Physical Activity: Inactive (01/16/2022)   Exercise Vital Sign    Days of Exercise per Week: 0 days    Minutes of Exercise per Session: 0 min  Stress: Stress Concern Present (01/16/2022)   [place]    Feeling of Stress : To some extent  Social Connections: Moderately Isolated (01/15/2021)   Social Connection and Isolation Panel [NHANES]    Frequency of Communication with Friends and Family: Twice a week    Frequency of Social Gatherings with Friends and Family: Once a week    Attends  Religious Services: Never    Active Member of Clubs or Organizations: No    Attends  Archivist Meetings: Never    Marital Status: Living with partner    Tobacco Counseling Ready to quit: Not Answered Counseling given: Yes Tobacco comments: smokes 5-6 cigarettes per day 06/07/2020   Clinical Intake:  Pre-visit preparation completed: Yes  Pain : No/denies pain     BMI - recorded: 25.7 Nutritional Status: BMI 25 -29 Overweight Nutritional Risks: Nausea/ vomitting/ diarrhea (intermittent nausea, unknown cause) Diabetes: Yes CBG done?: No Did pt. bring in CBG monitor from home?: No  How often do you need to have someone help you when you read instructions, pamphlets, or other written materials from your doctor or pharmacy?: 4 - Often  Diabetic?Nutrition Risk Assessment:  Has the patient had any N/V/D within the last 2 months?  Yes  Does the patient have any non-healing wounds?  No  Has the patient had any unintentional weight loss or weight gain?  No   Diabetes:  Is the patient diabetic?  Yes  If diabetic, was a CBG obtained today?  No  Did the patient bring in their glucometer from home?  No  How often do you monitor your CBG's? Doesn't usually check.   Financial Strains and Diabetes Management:  Are you having any financial strains with the device, your supplies or your medication? No .  Does the patient want to be seen by Chronic Care Management for management of their diabetes?  No  Would the patient like to be referred to a Nutritionist or for Diabetic Management?  No   Diabetic Exams:  Diabetic Eye Exam: Completed 01/2021  per pt- need records.  Diabetic Foot Exam: Completed 09/12/2020. Pt has been advised about the importance in completing this exam. Pt is scheduled for diabetic foot exam on 01/24/2022.    Interpreter Needed?: No  Comments: ex-wife/partner - [name] lives with him and assists daily - he also has daughter in Sports coach, [place] to help Information entered by :: [employer], LPN   Activities of Daily Living    01/16/2022   10:44  AM 12/09/2021   12:34 PM  In your present state of health, do you have any difficulty performing the following activities:  Hearing? 1 1  Comment a little   Vision? 1 1  Comment sometimes   Difficulty concentrating or making decisions? 1 0  Walking or climbing stairs? 1 0  Comment SOB   Dressing or bathing? 0 0  Doing errands, shopping? 0 0  Preparing Food and eating ? N   Using the Toilet? N   In the past six months, have you accidently leaked urine? N   Comment very frequent   Do you have problems with loss of bowel control? N   Managing your Medications? Y   Managing your Finances? Y   Housekeeping or managing your Housekeeping? N     Patient Care Team: [name], FNP as PCP - General (Family Medicine) Harl Bowie Alphonse Guild, MD as PCP - Cardiology (Cardiology) Lavera Guise, [facility] (Pharmacist) Harlen Labs, MD as Referring Physician (Optometry) Franchot Gallo, MD as Consulting Physician (Urology) Early, Arvilla Meres, MD as Consulting Physician (Vascular Surgery)  Indicate any recent Medical Services you may have received from other than [facility] providers in the past year (date may be approximate).     Assessment:   This is a routine wellness examination for [place].  Hearing/Vision screen Hearing Screening - Comments:: C/o  mild hearing difficulties   Vision Screening - Comments:: Wears rx glasses - up to date with routine eye exams with [facility] in [place] issues and exercise activities discussed: Current Exercise Habits: The patient does not participate in regular exercise at present, Exercise limited by: respiratory conditions(s);cardiac condition(s);orthopedic condition(s);neurologic condition(s)   Goals Addressed             This Visit's Progress    Patient Stated   Not on track    01/16/2022 AWV Goal: Exercise for General Health  Patient will verbalize understanding of the benefits of increased physical activity: Exercising regularly is  important. It will improve your overall fitness, flexibility, and endurance. Regular exercise also will improve your overall health. It can help you control your weight, reduce stress, and improve your bone density. Over the next year, patient will increase physical activity as tolerated with a goal of at least 150 minutes of moderate physical activity per week.  You can tell that you are exercising at a moderate intensity if your heart starts beating faster and you start breathing faster but can still hold a conversation. Moderate-intensity exercise ideas include: Walking 1 mile (1.6 km) in about 15 minutes Biking Hiking Golfing Dancing Water aerobics Patient will verbalize understanding of everyday activities that increase physical activity by providing examples like the following: Yard work, such as: Sales promotion account executive Gardening Washing windows or floors Patient will be able to explain general safety guidelines for exercising:  Before you start a new exercise program, talk with your health care provider. Do not exercise so much that you hurt yourself, feel dizzy, or get very short of breath. Wear comfortable clothes and wear shoes with good support. Drink plenty of water while you exercise to prevent dehydration or heat stroke. Work out until your breathing and your heartbeat get faster.      Quit Smoking         Depression Screen    01/16/2022   10:46 AM 07/05/2021    3:10 PM 01/17/2021   11:33 AM 01/15/2021   11:27 AM 10/12/2020   11:20 AM 09/25/2020   11:39 AM 09/12/2020    3:27 PM  PHQ 2/9 Scores  PHQ - 2 Score 4 0 2 2 0 3 0  PHQ- 9 Score 12 3 12 9  12  0    Fall Risk    01/16/2022   10:38 AM 08/14/2021    2:29 PM 01/17/2021   10:43 AM 01/15/2021   11:33 AM 10/12/2020   11:19 AM  Fall Risk   Falls in the past year? 0 1 0 0 1  Number falls in past yr: 0 1  0 1  Injury with Fall? 0 1  0 1  Risk  for fall due to : Orthopedic patient;Other (Comment) History of fall(s)  Orthopedic patient;Medication side effect;Impaired vision;Impaired balance/gait History of fall(s)  Risk for fall due to: Comment neuropathy      Follow up Falls prevention discussed;Education provided Education provided  Education provided;Falls prevention discussed     FALL RISK PREVENTION PERTAINING TO THE HOME:  Any stairs in or around the home? No  If so, are there any without handrails? No  Home free of loose throw rugs in walkways, pet beds, electrical cords, etc? Yes  Adequate lighting in your home to reduce risk of falls? Yes   ASSISTIVE DEVICES UTILIZED TO PREVENT FALLS:  Life alert?  No  Use of a cane, walker or w/c? No  Grab bars in the bathroom? No  Shower chair or bench in shower? Yes  Elevated toilet seat or a handicapped toilet? Yes   TIMED UP AND GO:  Was the test performed? No . Telephonic visit  Cognitive Function:        01/16/2022   10:53 AM 01/04/2020    1:58 PM 12/24/2018    1:34 PM  6CIT Screen  What Year? 4 points 0 points 4 points  What month? 0 points 0 points 0 points  What time? 0 points 0 points 0 points  Count back from 20 0 points 0 points 0 points  Months in reverse 4 points 2 points 2 points  Repeat phrase 2 points 2 points 10 points  Total Score 10 points 4 points 16 points    Immunizations Immunization History  Administered Date(s) Administered   Fluad Quad(high Dose 65+) 04/01/2019   Influenza Split 03/09/2014   Influenza,inj,Quad PF,6+ Mos 06/28/2021   Influenza-Unspecified 08/20/2016   Pneumococcal Conjugate-13 11/19/2017   Pneumococcal Polysaccharide-23 08/20/2016   Tdap 10/06/1996, 06/13/2001, 09/12/2020    TDAP status: Up to date  Flu Vaccine status: Due, Education has been provided regarding the importance of this vaccine. Advised may receive this vaccine at local pharmacy or Health Dept. Aware to provide a copy of the vaccination record if obtained  from local pharmacy or Health Dept. Verbalized acceptance and understanding.  Pneumococcal vaccine status: Up to date  Covid-19 vaccine status: Declined, Education has been provided regarding the importance of this vaccine but patient still declined. Advised may receive this vaccine at local pharmacy or Health Dept.or vaccine clinic. Aware to provide a copy of the vaccination record if obtained from local pharmacy or Health Dept. Verbalized acceptance and understanding.  Qualifies for Shingles Vaccine? Yes   Zostavax completed No   Shingrix Completed?: No.    Education has been provided regarding the importance of this vaccine. Patient has been advised to call insurance company to determine out of pocket expense if they have not yet received this vaccine. Advised may also receive vaccine at local pharmacy or Health Dept. Verbalized acceptance and understanding.  Screening Tests Health Maintenance  Topic Date Due   COVID-19 Vaccine (1) Never done   OPHTHALMOLOGY EXAM  Never done   Zoster Vaccines- Shingrix (1 of 2) Never done   FOOT EXAM  09/12/2021   INFLUENZA VACCINE  01/07/2022   HEMOGLOBIN A1C  06/19/2022   COLONOSCOPY (Pts 45-29yr Insurance coverage will need to be confirmed)  07/21/2023   TETANUS/TDAP  09/13/2030   Pneumonia Vaccine 76 Years old  Completed   Hepatitis C Screening  Completed   HPV VACCINES  Aged Out    Health Maintenance  Health Maintenance Due  Topic Date Due   COVID-19 Vaccine (1) Never done   OPHTHALMOLOGY EXAM  Never done   Zoster Vaccines- Shingrix (1 of 2) Never done   FOOT EXAM  09/12/2021   INFLUENZA VACCINE  01/07/2022    Colorectal cancer screening: Type of screening: Colonoscopy. Completed 07/20/2020. Repeat every 3 years  Lung Cancer Screening: (Low Dose CT Chest recommended if Age 751-80years, 30 pack-year currently smoking OR have quit w/in 15years.) does not qualify. I think he has too many co-morbidities  Additional  Screening:  Hepatitis C Screening: does qualify; Completed 02/14/2016  Vision Screening: Recommended annual ophthalmology exams for early detection of glaucoma and other disorders of the eye. Is the patient up to  date with their annual eye exam?  Yes  Who is the provider or what is the name of the office in which the patient attends annual eye exams? [facility] If pt is not established with a provider, would they like to be referred to a provider to establish care? No .   Dental Screening: Recommended annual dental exams for proper oral hygiene  Community Resource Referral / Chronic Care Management: CRR required this visit?  No   CCM required this visit?  No      Plan:     I have personally reviewed and noted the following in the patient's chart:   Medical and social history Use of alcohol, tobacco or illicit drugs  Current medications and supplements including opioid prescriptions. Patient is not currently taking opioid prescriptions. Functional ability and status Nutritional status Physical activity Advanced directives List of other physicians Hospitalizations, surgeries, and ER visits in previous 12 months Vitals Screenings to include cognitive, depression, and falls Referrals and appointments  In addition, I have reviewed and discussed with patient certain preventive protocols, quality metrics, and best practice recommendations. A written personalized care plan for preventive services as well as general preventive health recommendations were provided to patient.     Sandrea Hammond, LPN   [id number]   Nurse Notes: 6CIT = 10 - has has noticed increased memory problems; PHQ-9 = 12 - He feels anxious a lot and too depressed to make himself do anything.

## 2022-01-16 NOTE — Patient Instructions (Signed)
Ronald [name] , Thank you for taking time to come for your Medicare Wellness Visit. I appreciate your ongoing commitment to your health goals. Please review the following plan we discussed and let me know if I can assist you in the future.   Screening recommendations/referrals: Colonoscopy: Done 07/20/2020 - Repeat in 3 years   Recommended yearly ophthalmology/optometry visit for glaucoma screening and checkup Recommended yearly dental visit for hygiene and checkup  Vaccinations: Influenza vaccine: Due - recommended every fall Pneumococcal vaccine: Done  08/20/2016 & 11/19/2017    Tdap vaccine: Done 09/12/2020 - Repeat in 10 years  Shingles vaccine: Due - Shingrix is 2 doses 2-6 months apart and over 90% effective     Covid-19: declined  Advanced directives: in chart  Conditions/risks identified: Aim for 30 minutes of exercise or brisk walking, 6-8 glasses of water, and 5 servings of fruits and vegetables each day.   Next appointment: Follow up in one year for your annual wellness visit.   Preventive Care 74 Years and Older, Male  Preventive care refers to lifestyle choices and visits with your health care provider that can promote health and wellness. What does preventive care include? A yearly physical exam. This is also called an annual well check. Dental exams once or twice a year. Routine eye exams. Ask your health care provider how often you should have your eyes checked. Personal lifestyle choices, including: Daily care of your teeth and gums. Regular physical activity. Eating a healthy diet. Avoiding tobacco and drug use. Limiting alcohol use. Practicing safe sex. Taking low doses of aspirin every day. Taking vitamin and mineral supplements as recommended by your health care provider. What happens during an annual well check? The services and screenings done by your health care provider during your annual well check will depend on your age, overall health, lifestyle risk  factors, and family history of disease. Counseling  Your health care provider may ask you questions about your: Alcohol use. Tobacco use. Drug use. Emotional well-being. Home and relationship well-being. Sexual activity. Eating habits. History of falls. Memory and ability to understand (cognition). Work and work Statistician. Screening  You may have the following tests or measurements: Height, weight, and BMI. Blood pressure. Lipid and cholesterol levels. These may be checked every 5 years, or more frequently if you are over 5 years old. Skin check. Lung cancer screening. You may have this screening every year starting at age 74 if you have a 30-pack-year history of smoking and currently smoke or have quit within the past 15 years. Fecal occult blood test (FOBT) of the stool. You may have this test every year starting at age 65. Flexible sigmoidoscopy or colonoscopy. You may have a sigmoidoscopy every 5 years or a colonoscopy every 10 years starting at age 74. Prostate cancer screening. Recommendations will vary depending on your family history and other risks. Hepatitis C blood test. Hepatitis B blood test. Sexually transmitted disease (STD) testing. Diabetes screening. This is done by checking your blood sugar (glucose) after you have not eaten for a while (fasting). You may have this done every 1-3 years. Abdominal aortic aneurysm (AAA) screening. You may need this if you are a current or former smoker. Osteoporosis. You may be screened starting at age 74 if you are at high risk. Talk with your health care provider about your test results, treatment options, and if necessary, the need for more tests. Vaccines  Your health care provider may recommend certain vaccines, such as: Influenza vaccine. This is  recommended every year. Tetanus, diphtheria, and acellular pertussis (Tdap, Td) vaccine. You may need a Td booster every 10 years. Zoster vaccine. You may need this after age  74. Pneumococcal 13-valent conjugate (PCV13) vaccine. One dose is recommended after age 74. Pneumococcal polysaccharide (PPSV23) vaccine. One dose is recommended after age 74. Talk to your health care provider about which screenings and vaccines you need and how often you need them. This information is not intended to replace advice given to you by your health care provider. Make sure you discuss any questions you have with your health care provider. Document Released: 06/22/2015 Document Revised: 02/13/2016 Document Reviewed: 03/27/2015 Elsevier Interactive Patient Education  2017 [place] Prevention in the Home Falls can cause injuries. They can happen to people of all ages. There are many things you can do to make your home safe and to help prevent falls. What can I do on the outside of my home? Regularly fix the edges of walkways and driveways and fix any cracks. Remove anything that might make you trip as you walk through a door, such as a raised step or threshold. Trim any bushes or trees on the path to your home. Use bright outdoor lighting. Clear any walking paths of anything that might make someone trip, such as rocks or tools. Regularly check to see if handrails are loose or broken. Make sure that both sides of any steps have handrails. Any raised decks and porches should have guardrails on the edges. Have any leaves, snow, or ice cleared regularly. Use sand or salt on walking paths during winter. Clean up any spills in your garage right away. This includes oil or grease spills. What can I do in the bathroom? Use night lights. Install grab bars by the toilet and in the tub and shower. Do not use towel bars as grab bars. Use non-skid mats or decals in the tub or shower. If you need to sit down in the shower, use a plastic, non-slip stool. Keep the floor dry. Clean up any water that spills on the floor as soon as it happens. Remove soap buildup in the tub or shower  regularly. Attach bath mats securely with double-sided non-slip rug tape. Do not have throw rugs and other things on the floor that can make you trip. What can I do in the bedroom? Use night lights. Make sure that you have a light by your bed that is easy to reach. Do not use any sheets or blankets that are too big for your bed. They should not hang down onto the floor. Have a firm chair that has side arms. You can use this for support while you get dressed. Do not have throw rugs and other things on the floor that can make you trip. What can I do in the kitchen? Clean up any spills right away. Avoid walking on wet floors. Keep items that you use a lot in easy-to-reach places. If you need to reach something above you, use a strong step stool that has a grab bar. Keep electrical cords out of the way. Do not use floor polish or wax that makes floors slippery. If you must use wax, use non-skid floor wax. Do not have throw rugs and other things on the floor that can make you trip. What can I do with my stairs? Do not leave any items on the stairs. Make sure that there are handrails on both sides of the stairs and use them. Fix handrails that are  broken or loose. Make sure that handrails are as long as the stairways. Check any carpeting to make sure that it is firmly attached to the stairs. Fix any carpet that is loose or worn. Avoid having throw rugs at the top or bottom of the stairs. If you do have throw rugs, attach them to the floor with carpet tape. Make sure that you have a light switch at the top of the stairs and the bottom of the stairs. If you do not have them, ask someone to add them for you. What else can I do to help prevent falls? Wear shoes that: Do not have high heels. Have rubber bottoms. Are comfortable and fit you well. Are closed at the toe. Do not wear sandals. If you use a stepladder: Make sure that it is fully opened. Do not climb a closed stepladder. Make sure that  both sides of the stepladder are locked into place. Ask someone to hold it for you, if possible. Clearly mark and make sure that you can see: Any grab bars or handrails. First and last steps. Where the edge of each step is. Use tools that help you move around (mobility aids) if they are needed. These include: Canes. Walkers. Scooters. Crutches. Turn on the lights when you go into a dark area. Replace any light bulbs as soon as they burn out. Set up your furniture so you have a clear path. Avoid moving your furniture around. If any of your floors are uneven, fix them. If there are any pets around you, be aware of where they are. Review your medicines with your doctor. Some medicines can make you feel dizzy. This can increase your chance of falling. Ask your doctor what other things that you can do to help prevent falls. This information is not intended to replace advice given to you by your health care provider. Make sure you discuss any questions you have with your health care provider. Document Released: 03/22/2009 Document Revised: 11/01/2015 Document Reviewed: 06/30/2014 Elsevier Interactive Patient Education  2017 [employer].

## 2022-01-17 ENCOUNTER — Ambulatory Visit: Payer: Medicare HMO | Admitting: Family

## 2022-01-23 ENCOUNTER — Ambulatory Visit: Payer: Self-pay | Admitting: *Deleted

## 2022-01-23 NOTE — Patient Outreach (Signed)
  Care Coordination   01/23/2022 Name: Ronald [name] MRN: [id number] DOB: 1946-05-31   Care Coordination Outreach Attempts:  An unsuccessful telephone outreach was attempted today to offer the patient information about available care coordination services as a benefit of their health plan.  Out going message at preferred number indicated [phone] [name] Gullickson   Follow Up Plan:  Additional outreach attempts will be made to offer the patient care coordination information and services.   Encounter Outcome:  No Answer  Care Coordination Interventions Activated:  No   Care Coordination Interventions:  No, not indicated    SIG Quantarius Genrich L. Lavina Hamman, RN, BSN, [place] Coordinator Office number [phone]

## 2022-01-24 ENCOUNTER — Ambulatory Visit (INDEPENDENT_AMBULATORY_CARE_PROVIDER_SITE_OTHER): Payer: Medicare HMO | Admitting: Family

## 2022-01-24 ENCOUNTER — Encounter: Payer: Self-pay | Admitting: Family

## 2022-01-24 VITALS — BP 154/69 | HR 67 | Temp 97.7°F | Ht 69.0 in | Wt 174.0 lb

## 2022-01-24 DIAGNOSIS — E669 Obesity, unspecified: Secondary | ICD-10-CM | POA: Diagnosis not present

## 2022-01-24 DIAGNOSIS — F1320 Sedative, hypnotic or anxiolytic dependence, uncomplicated: Secondary | ICD-10-CM | POA: Diagnosis not present

## 2022-01-24 DIAGNOSIS — E1142 Type 2 diabetes mellitus with diabetic polyneuropathy: Secondary | ICD-10-CM | POA: Diagnosis not present

## 2022-01-24 DIAGNOSIS — I890 Lymphedema, not elsewhere classified: Secondary | ICD-10-CM

## 2022-01-24 DIAGNOSIS — L239 Allergic contact dermatitis, unspecified cause: Secondary | ICD-10-CM

## 2022-01-24 DIAGNOSIS — E1169 Type 2 diabetes mellitus with other specified complication: Secondary | ICD-10-CM

## 2022-01-24 DIAGNOSIS — J9611 Chronic respiratory failure with hypoxia: Secondary | ICD-10-CM | POA: Diagnosis not present

## 2022-01-24 DIAGNOSIS — K219 Gastro-esophageal reflux disease without esophagitis: Secondary | ICD-10-CM

## 2022-01-24 DIAGNOSIS — I10 Essential (primary) hypertension: Secondary | ICD-10-CM | POA: Diagnosis not present

## 2022-01-24 DIAGNOSIS — F17210 Nicotine dependence, cigarettes, uncomplicated: Secondary | ICD-10-CM

## 2022-01-24 DIAGNOSIS — I2510 Atherosclerotic heart disease of native coronary artery without angina pectoris: Secondary | ICD-10-CM | POA: Diagnosis not present

## 2022-01-24 DIAGNOSIS — Z79899 Other long term (current) drug therapy: Secondary | ICD-10-CM

## 2022-01-24 DIAGNOSIS — E782 Mixed hyperlipidemia: Secondary | ICD-10-CM | POA: Diagnosis not present

## 2022-01-24 DIAGNOSIS — J439 Emphysema, unspecified: Secondary | ICD-10-CM | POA: Diagnosis not present

## 2022-01-24 DIAGNOSIS — I252 Old myocardial infarction: Secondary | ICD-10-CM

## 2022-01-24 DIAGNOSIS — F331 Major depressive disorder, recurrent, moderate: Secondary | ICD-10-CM

## 2022-01-24 DIAGNOSIS — I509 Heart failure, unspecified: Secondary | ICD-10-CM | POA: Diagnosis not present

## 2022-01-24 DIAGNOSIS — Z720 Tobacco use: Secondary | ICD-10-CM

## 2022-01-24 DIAGNOSIS — F419 Anxiety disorder, unspecified: Secondary | ICD-10-CM

## 2022-01-24 LAB — BAYER DCA HB A1C WAIVED: HB A1C (BAYER DCA - WAIVED): 7.2 % — ABNORMAL HIGH (ref 4.8–5.6)

## 2022-01-24 MED ORDER — ALPRAZOLAM 0.25 MG PO TABS: 0.2500 mg | ORAL_TABLET | Freq: Two times a day (BID) | ORAL | 2 refills | Status: DC | PRN

## 2022-01-24 MED ORDER — PREDNISONE 10 MG (21) PO TBPK: ORAL_TABLET | ORAL | 0 refills | Status: DC

## 2022-01-24 MED ORDER — TRIAMCINOLONE ACETONIDE 0.5 % EX OINT: 1.0000 | TOPICAL_OINTMENT | Freq: Two times a day (BID) | CUTANEOUS | 0 refills | Status: DC

## 2022-01-24 NOTE — Progress Notes (Signed)
Subjective:    Patient ID: Ronald [name], male    DOB: [birth year], 76 y.o.   MRN: [id number]  Chief Complaint  Patient presents with   Follow-up   Back Pain   Cyst    On abdomen and hard     Pt presents to the office today for chronic follow up. He is currently using 3.5 L at night and as needed.  He has COPD. Continues to smoke 1/2 pack a day. Has intermittent SOB.    He is followed by  Cardiologists for A Fib, CHF, and CAD.  Back Pain  Diabetes He presents for his follow-up diabetic visit. He has type 2 diabetes mellitus. Hypoglycemia symptoms include nervousness/anxiousness. Associated symptoms include fatigue and foot paresthesias. Pertinent negatives for diabetes include no blurred vision. Symptoms are stable. Diabetic complications include peripheral neuropathy. Risk factors for coronary artery disease include dyslipidemia, diabetes mellitus, hypertension, male sex and sedentary lifestyle. (Does not check BS at home) Eye exam is not current.  Hypertension This is a chronic problem. The current episode started more than 1 year ago. The problem has been waxing and waning since onset. The problem is controlled. Associated symptoms include anxiety, malaise/fatigue and shortness of breath. Pertinent negatives include no blurred vision or peripheral edema. The current treatment provides moderate improvement. Hypertensive end-organ damage includes heart failure.  Congestive Heart Failure Presents for follow-up visit. Associated symptoms include edema, fatigue and shortness of breath. The symptoms have been stable.  Anxiety Presents for follow-up visit. Symptoms include depressed mood, excessive worry, irritability, nervous/anxious behavior and shortness of breath. Symptoms occur occasionally. The severity of symptoms is moderate.    Depression        This is a chronic problem.  The current episode started more than 1 year ago.   The onset quality is gradual.   Associated symptoms include  fatigue, hopelessness, decreased interest and sad.  Associated symptoms include no helplessness.  Past medical history includes anxiety.   Hyperlipidemia This is a chronic problem. The current episode started more than 1 year ago. The problem is controlled. Recent lipid tests were reviewed and are normal. Exacerbating diseases include obesity. Associated symptoms include shortness of breath. Current antihyperlipidemic treatment includes statins. The current treatment provides significant improvement of lipids. Risk factors for coronary artery disease include dyslipidemia, male sex, hypertension and a sedentary lifestyle.  Rash This is a new problem. The current episode started 1 to 4 weeks ago. The problem has been gradually worsening since onset. Location: bilateral arms. The rash is characterized by itchiness and redness. He was exposed to nothing. Associated symptoms include fatigue and shortness of breath.      Review of Systems  Constitutional:  Positive for fatigue, irritability and malaise/fatigue.  Eyes:  Negative for blurred vision.  Respiratory:  Positive for shortness of breath.   Musculoskeletal:  Positive for back pain.  Skin:  Positive for rash.  Psychiatric/Behavioral:  Positive for depression. The patient is nervous/anxious.   All other systems reviewed and are negative.      Objective:   Physical Exam Vitals reviewed.  Constitutional:      General: He is not in acute distress.    Appearance: He is well-developed. He is obese.  HENT:     Head: Normocephalic.     Right Ear: Tympanic membrane normal.     Left Ear: Tympanic membrane normal.  Eyes:     General:        Right eye: No discharge.  Left eye: No discharge.     Pupils: Pupils are equal, round, and reactive to light.  Neck:     Thyroid: No thyromegaly.  Cardiovascular:     Rate and Rhythm: Normal rate and regular rhythm.     Heart sounds: Normal heart sounds. No murmur heard. Pulmonary:     Effort:  Pulmonary effort is normal. No respiratory distress.     Breath sounds: Normal breath sounds. No wheezing.  Abdominal:     General: Bowel sounds are normal. There is no distension.     Palpations: Abdomen is soft.     Tenderness: There is no abdominal tenderness.  Musculoskeletal:        General: Swelling present. No tenderness. Normal range of motion.     Cervical back: Normal range of motion and neck supple.     Comments: Lymphedema bilateral arms   Skin:    General: Skin is warm and dry.     Findings: Rash present. No erythema.          Comments: Erythemas rash on bilateral arms  Neurological:     Mental Status: He is alert and oriented to person, place, and time.     Cranial Nerves: No cranial nerve deficit.     Deep Tendon Reflexes: Reflexes are normal and symmetric.  Psychiatric:        Behavior: Behavior normal.        Thought Content: Thought content normal.        Judgment: Judgment normal.       BP (!) 154/69   Pulse 67   Temp 97.7 F (36.5 C) (Temporal)   Ht 5' 9"  (1.753 m)   Wt 174 lb (78.9 kg)   SpO2 92%   BMI 25.70 kg/m      Assessment & Plan:  Ronald [name] comes in today with chief complaint of Follow-up, Back Pain, and Cyst (On abdomen and hard )   Diagnosis and orders addressed:  1. Pulmonary emphysema, unspecified emphysema type ([place]) - CMP14+EGFR - CBC with Differential/Platelet  2. Chronic respiratory failure with hypoxia (HCC) - CMP14+EGFR - CBC with Differential/Platelet  3. Essential hypertension - CMP14+EGFR - CBC with Differential/Platelet  4. Congestive heart failure, unspecified HF chronicity, unspecified heart failure type ([place]) - CMP14+EGFR - CBC with Differential/Platelet  5. CAD in native artery - CMP14+EGFR - CBC with Differential/Platelet  6. Gastroesophageal reflux disease without esophagitis - CMP14+EGFR - CBC with Differential/Platelet  7. Diabetes mellitus type 2 in obese (HCC) - Bayer DCA Hb A1c Waived -  CMP14+EGFR - CBC with Differential/Platelet - Microalbumin / creatinine urine ratio  8. Diabetic polyneuropathy associated with type 2 diabetes mellitus (HCC) - CMP14+EGFR - CBC with Differential/Platelet  9. Tobacco abuse - CMP14+EGFR - CBC with Differential/Platelet  10. Mixed hyperlipidemia - CMP14+EGFR - CBC with Differential/Platelet  11. Lymphedema - CMP14+EGFR - CBC with Differential/Platelet  12. History of MI (myocardial infarction) - CMP14+EGFR - CBC with Differential/Platelet  13. Moderate episode of recurrent major depressive disorder (HCC) - CMP14+EGFR - CBC with Differential/Platelet  14. Controlled substance agreement signed - CMP14+EGFR - CBC with Differential/Platelet - ToxASSURE Select 13 (MW), Urine  15. Cigarette smoker - CMP14+EGFR - CBC with Differential/Platelet  16. Benzodiazepine dependence (HCC) - CMP14+EGFR - CBC with Differential/Platelet - ALPRAZolam (XANAX) 0.25 MG tablet; Take 1 tablet (0.25 mg total) by mouth 2 (two) times daily as needed for anxiety.  Dispense: 60 tablet; Refill: 2 - ToxASSURE Select 13 (MW), Urine  17. Anxiety -  CMP14+EGFR - CBC with Differential/Platelet - ALPRAZolam (XANAX) 0.25 MG tablet; Take 1 tablet (0.25 mg total) by mouth 2 (two) times daily as needed for anxiety.  Dispense: 60 tablet; Refill: 2  18. Allergic contact dermatitis, unspecified trigger  - predniSONE (STERAPRED UNI-PAK 21 TAB) 10 MG (21) TBPK tablet; Use as directed  Dispense: 21 tablet; Refill: 0 - triamcinolone ointment (KENALOG) 0.5 %; Apply 1 Application topically 2 (two) times daily.  Dispense: 30 g; Refill: 0   Labs pending Patient reviewed in [place] controlled database, no flags noted. Contract and drug screen are up to date.  Health Maintenance reviewed Diet and exercise encouraged  Follow up plan: 3 months    [name], FNP

## 2022-01-24 NOTE — Patient Instructions (Signed)
Contact Dermatitis Dermatitis is redness, soreness, and swelling (inflammation) of the skin. Contact dermatitis is a reaction to certain substances that touch the skin. Many different substances can cause contact dermatitis. There are two types of contact dermatitis: Irritant contact dermatitis. This type is caused by something that irritates your skin, such as having dry hands from washing them too often with soap. This type does not require previous exposure to the substance for a reaction to occur. This is the most common type. Allergic contact dermatitis. This type is caused by a substance that you are allergic to, such as poison ivy. This type occurs when you have been exposed to the substance (allergen) and develop a sensitivity to it. Dermatitis may develop soon after your first exposure to the allergen, or it may not develop until the next time you are exposed and every time thereafter. What are the causes? Irritant contact dermatitis is most commonly caused by exposure to: Makeup. Soaps. Detergents. Bleaches. Acids. Metal salts, such as nickel. Allergic contact dermatitis is most commonly caused by exposure to: Poisonous plants. Chemicals. Jewelry. Latex. Medicines. Preservatives in products, such as clothing. What increases the risk? You are more likely to develop this condition if you have: A job that exposes you to irritants or allergens. Certain medical conditions, such as asthma or eczema. What are the signs or symptoms? Symptoms of this condition may occur on your body anywhere the irritant has touched you or is touched by you. Symptoms include: Dryness or flaking. Redness. Cracks. Itching. Pain or a burning feeling. Blisters. Drainage of small amounts of blood or clear fluid from skin cracks. With allergic contact dermatitis, there may also be swelling in areas such as the eyelids, mouth, or genitals. How is this diagnosed? This condition is diagnosed with a medical  history and physical exam. A patch skin test may be performed to help determine the cause. If the condition is related to your job, you may need to see an occupational medicine specialist. How is this treated? This condition is treated by checking for the cause of the reaction and protecting your skin from further contact. Treatment may also include: Steroid creams or ointments. Oral steroid medicines may be needed in more severe cases. Antibiotic medicines or antibacterial ointments, if a skin infection is present. Antihistamine lotion or an antihistamine taken by mouth to ease itching. A bandage (dressing). Follow these instructions at home: Skin care Moisturize your skin as needed. Apply cool compresses to the affected areas. Try applying baking soda paste to your skin. Stir water into baking soda until it reaches a paste-like consistency. Do not scratch your skin, and avoid friction to the affected area. Avoid the use of soaps, perfumes, and dyes. Medicines Take or apply over-the-counter and prescription medicines only as told by your health care provider. If you were prescribed an antibiotic medicine, take or apply the antibiotic as told by your health care provider. Do not stop using the antibiotic even if your condition improves. Bathing Try taking a bath with: Epsom salts. Follow the instructions on the packaging. You can get these at your local pharmacy or grocery store. Baking soda. Pour a small amount into the bath as directed by your health care provider. Colloidal oatmeal. Follow the instructions on the packaging. You can get this at your local pharmacy or grocery store. Bathe less frequently, such as every other day. Bathe in lukewarm water. Avoid using hot water. Bandage care If you were given a bandage (dressing), change it as told  by your health care provider. Wash your hands with soap and water before and after you change your dressing. If soap and water are not  available, use hand sanitizer. General instructions Avoid the substance that caused your reaction. If you do not know what caused it, keep a journal to try to track what caused it. Write down: What you eat. What cosmetic products you use. What you drink. What you wear in the affected area. This includes jewelry. Check the affected areas every day for signs of infection. Check for: More redness, swelling, or pain. More fluid or blood. Warmth. Pus or a bad smell. Keep all follow-up visits as told by your health care provider. This is important. Contact a health care provider if: Your condition does not improve with treatment. Your condition gets worse. You have signs of infection such as swelling, tenderness, redness, soreness, or warmth in the affected area. You have a fever. You have new symptoms. Get help right away if: You have a severe headache, neck pain, or neck stiffness. You vomit. You feel very sleepy. You notice red streaks coming from the affected area. Your bone or joint underneath the affected area becomes painful after the skin has healed. The affected area turns darker. You have difficulty breathing. Summary Dermatitis is redness, soreness, and swelling (inflammation) of the skin. Contact dermatitis is a reaction to certain substances that touch the skin. Symptoms of this condition may occur on your body anywhere the irritant has touched you or is touched by you. This condition is treated by figuring out what caused the reaction and protecting your skin from further contact. Treatment may also include medicines and skin care. Avoid the substance that caused your reaction. If you do not know what caused it, keep a journal to try to track what caused it. Contact a health care provider if your condition gets worse or you have signs of infection such as swelling, tenderness, redness, soreness, or warmth in the affected area. This information is not intended to replace  advice given to you by your health care provider. Make sure you discuss any questions you have with your health care provider. Document Revised: 03/11/2021 Document Reviewed: 03/11/2021 Elsevier Patient Education  [place].

## 2022-01-25 LAB — CMP14+EGFR
ALT: 8 IU/L (ref 0–44)
AST: 10 IU/L (ref 0–40)
Albumin/Globulin Ratio: 2 (ref 1.2–2.2)
Albumin: 4.2 g/dL (ref 3.8–4.8)
Alkaline Phosphatase: 94 IU/L (ref 44–121)
BUN/Creatinine Ratio: 12 (ref 10–24)
BUN: 21 mg/dL (ref 8–27)
Bilirubin Total: 0.3 mg/dL (ref 0.0–1.2)
CO2: 25 mmol/L (ref 20–29)
Calcium: 9.1 mg/dL (ref 8.6–10.2)
Chloride: 98 mmol/L (ref 96–106)
Creatinine, Ser: 1.77 mg/dL — ABNORMAL HIGH (ref 0.76–1.27)
Globulin, Total: 2.1 g/dL (ref 1.5–4.5)
Glucose: 129 mg/dL — ABNORMAL HIGH (ref 70–99)
Potassium: 4.7 mmol/L (ref 3.5–5.2)
Sodium: 143 mmol/L (ref 134–144)
Total Protein: 6.3 g/dL (ref 6.0–8.5)
eGFR: 40 mL/min/{1.73_m2} — ABNORMAL LOW (ref >59)

## 2022-01-25 LAB — CBC WITH DIFFERENTIAL/PLATELET
Basophils Absolute: 0.1 10*3/uL (ref 0.0–0.2)
Basos: 1 %
EOS (ABSOLUTE): 0.1 10*3/uL (ref 0.0–0.4)
Eos: 1 %
Hematocrit: 38.8 % (ref 37.5–51.0)
Hemoglobin: 12.3 g/dL — ABNORMAL LOW (ref 13.0–17.7)
Immature Grans (Abs): 0 10*3/uL (ref 0.0–0.1)
Immature Granulocytes: 0 %
Lymphocytes Absolute: 1.2 10*3/uL (ref 0.7–3.1)
Lymphs: 13 %
MCH: 26.5 pg — ABNORMAL LOW (ref 26.6–33.0)
MCHC: 31.7 g/dL (ref 31.5–35.7)
MCV: 83 fL (ref 79–97)
Monocytes Absolute: 0.9 10*3/uL (ref 0.1–0.9)
Monocytes: 10 %
Neutrophils Absolute: 6.8 10*3/uL (ref 1.4–7.0)
Neutrophils: 75 %
Platelets: 427 10*3/uL (ref 150–450)
RBC: 4.65 x10E6/uL (ref 4.14–5.80)
RDW: 15.2 % (ref 11.6–15.4)
WBC: 9.1 10*3/uL (ref 3.4–10.8)

## 2022-01-25 LAB — MICROALBUMIN / CREATININE URINE RATIO
Creatinine, Urine: 59 mg/dL
Microalb/Creat Ratio: 58 mg/g creat — ABNORMAL HIGH (ref 0–29)
Microalbumin, Urine: 34 ug/mL

## 2022-01-27 ENCOUNTER — Ambulatory Visit: Payer: Self-pay | Admitting: *Deleted

## 2022-01-27 DIAGNOSIS — I5032 Chronic diastolic (congestive) heart failure: Secondary | ICD-10-CM | POA: Diagnosis not present

## 2022-01-27 DIAGNOSIS — J9612 Chronic respiratory failure with hypercapnia: Secondary | ICD-10-CM | POA: Diagnosis not present

## 2022-01-27 DIAGNOSIS — J449 Chronic obstructive pulmonary disease, unspecified: Secondary | ICD-10-CM | POA: Diagnosis not present

## 2022-01-27 DIAGNOSIS — J9611 Chronic respiratory failure with hypoxia: Secondary | ICD-10-CM | POA: Diagnosis not present

## 2022-01-27 DIAGNOSIS — Z515 Encounter for palliative care: Secondary | ICD-10-CM | POA: Diagnosis not present

## 2022-01-27 NOTE — Patient Outreach (Signed)
  Care Coordination   01/27/2022 Name: Ronald [name] MRN: [id number] DOB: 1945-10-04   Care Coordination Outreach Attempts:  A second unsuccessful outreach was attempted today to offer the patient with information about available care coordination services as a benefit of their health plan.     Follow Up Plan:  Additional outreach attempts will be made to offer the patient care coordination information and services.   Encounter Outcome:  No Answer  Care Coordination Interventions Activated:  No   Care Coordination Interventions:  No, not indicated    SIG Jerod Mcquain L. Lavina Hamman, RN, BSN, [place] Coordinator Office number [phone]

## 2022-01-30 LAB — TOXASSURE SELECT 13 (MW), URINE

## 2022-01-31 ENCOUNTER — Ambulatory Visit: Payer: Self-pay | Admitting: *Deleted

## 2022-01-31 NOTE — Patient Outreach (Signed)
  Care Coordination   01/31/2022 Name: Ronald [name] MRN: [id number] DOB: 19-Dec-1945   Care Coordination Outreach Attempts:  A third unsuccessful outreach was attempted today to offer the patient with information about available care coordination services as a benefit of their health plan.   Follow Up Plan:  No further outreach attempts will be made at this time. We have been unable to contact the patient to offer or enroll patient in care coordination services  Encounter Outcome:  No Answer  Care Coordination Interventions Activated:  No   Care Coordination Interventions:  No, not indicated    SIG Brenya Taulbee L. Lavina Hamman, RN, BSN, [place] Coordinator Office number [phone]

## 2022-02-01 DIAGNOSIS — I5030 Unspecified diastolic (congestive) heart failure: Secondary | ICD-10-CM | POA: Diagnosis not present

## 2022-02-03 DIAGNOSIS — J439 Emphysema, unspecified: Secondary | ICD-10-CM | POA: Diagnosis not present

## 2022-02-04 ENCOUNTER — Other Ambulatory Visit: Payer: Self-pay | Admitting: Family

## 2022-02-08 IMAGING — DX DG CHEST 1V PORT
1 series · 1 of 1 positions shown · non-contrast
Comparison: 05/16/2021

CLINICAL DATA: Short of breath, hypoxia

EXAM:
PORTABLE CHEST 1 VIEW

[chest]
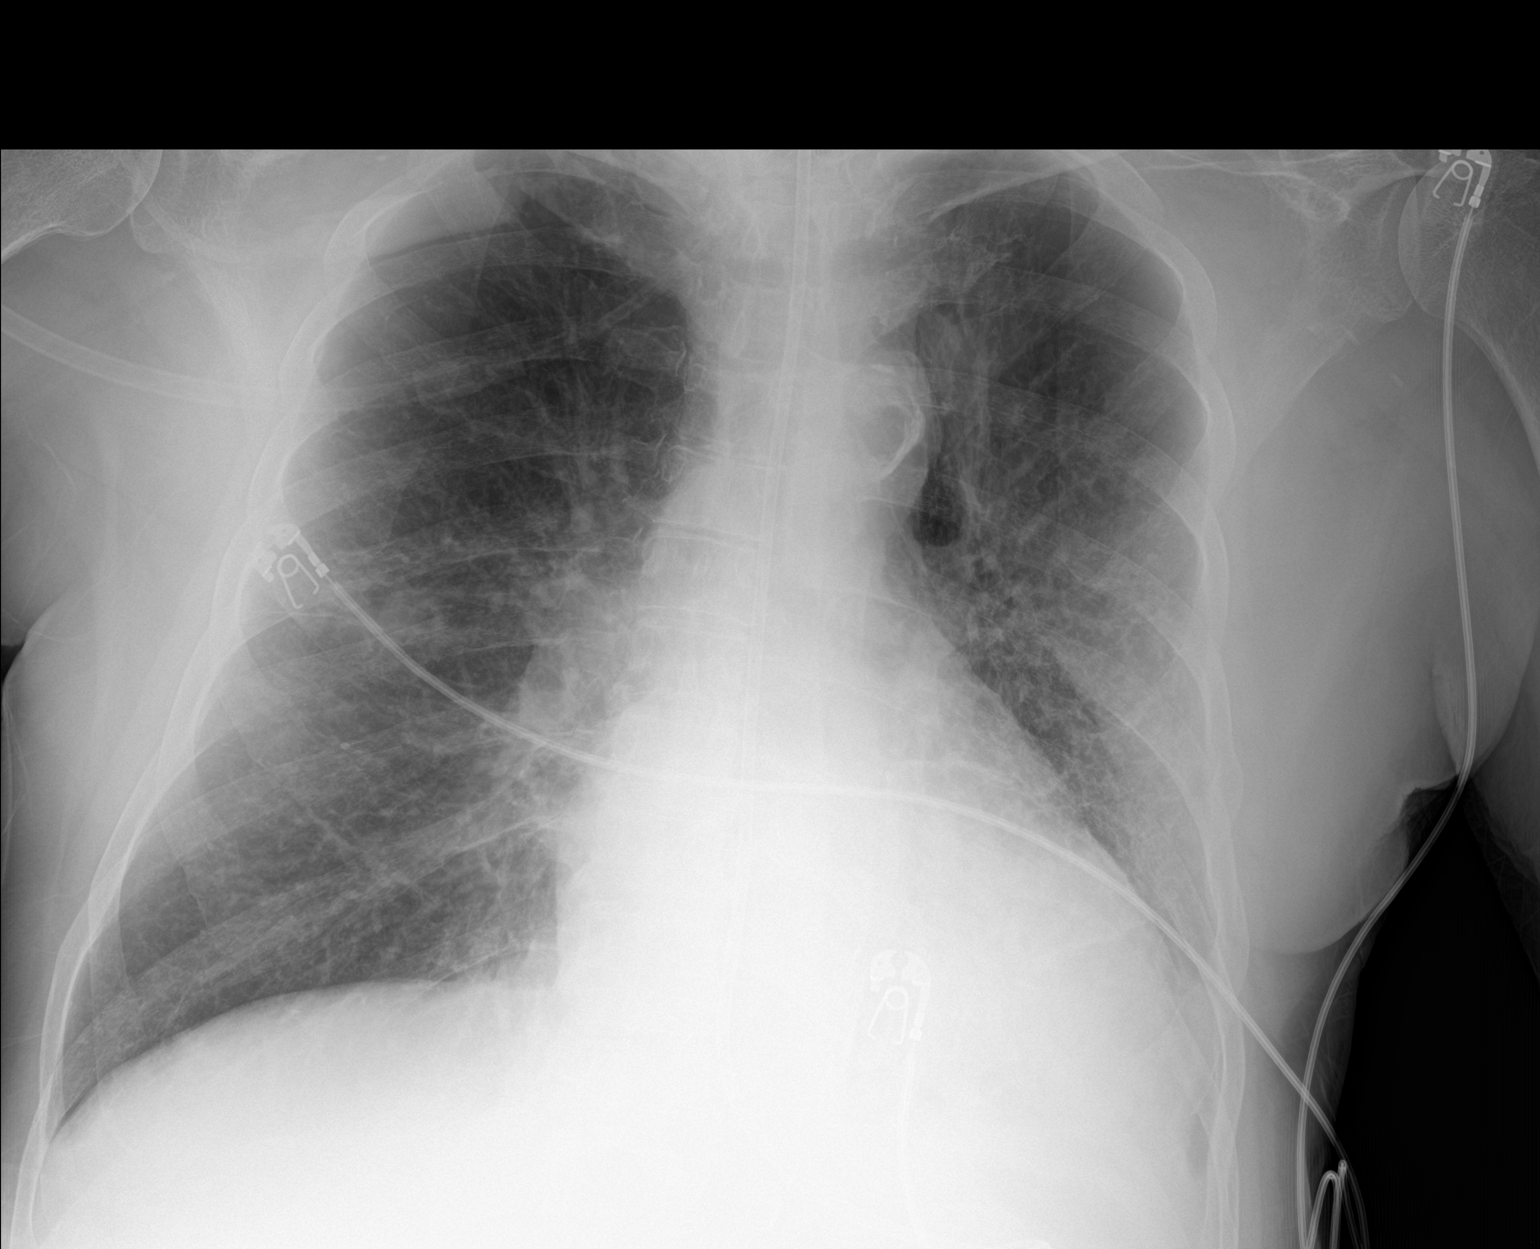

[1 of 1 positions shown; findings below may reference images not displayed]

FINDINGS: Single frontal view of the chest demonstrates enteric catheter
passing below diaphragm tip excluded by collimation. The cardiac
silhouette is stable. There is increasing consolidation at the left
lung base. No effusion or pneumothorax. No acute bony abnormalities.
IMPRESSION: 1. Progressive left basilar consolidation consistent with pneumonia
or aspiration.

## 2022-02-13 ENCOUNTER — Other Ambulatory Visit: Payer: Self-pay | Admitting: Family

## 2022-02-15 IMAGING — DX DG FEMUR 1V PORT*R*
1 series · 2 of 2 positions shown · non-contrast
Comparison: Knee radiographs from 05/29/2021 and hip radiographs
from 05/17/2021

CLINICAL DATA: Pain in the leg

EXAM:
RIGHT FEMUR PORTABLE 1 VIEW

[Series 1: femur · 0.14mm/px · 2 of 2 slices shown]
[im 1/2]
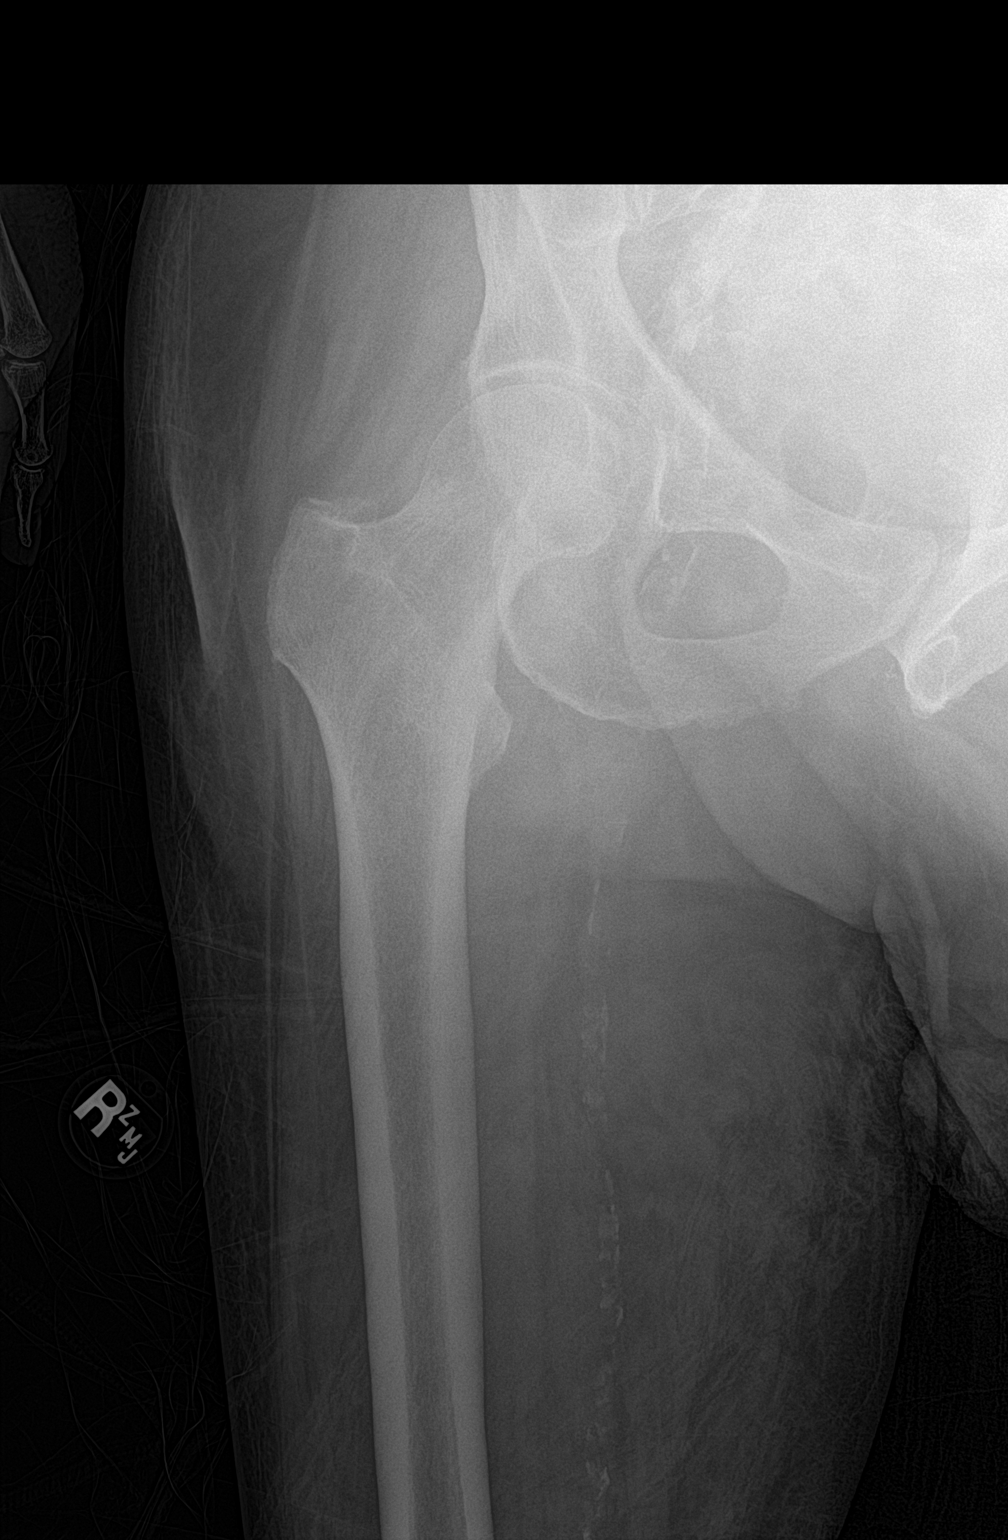
[im 2/2]
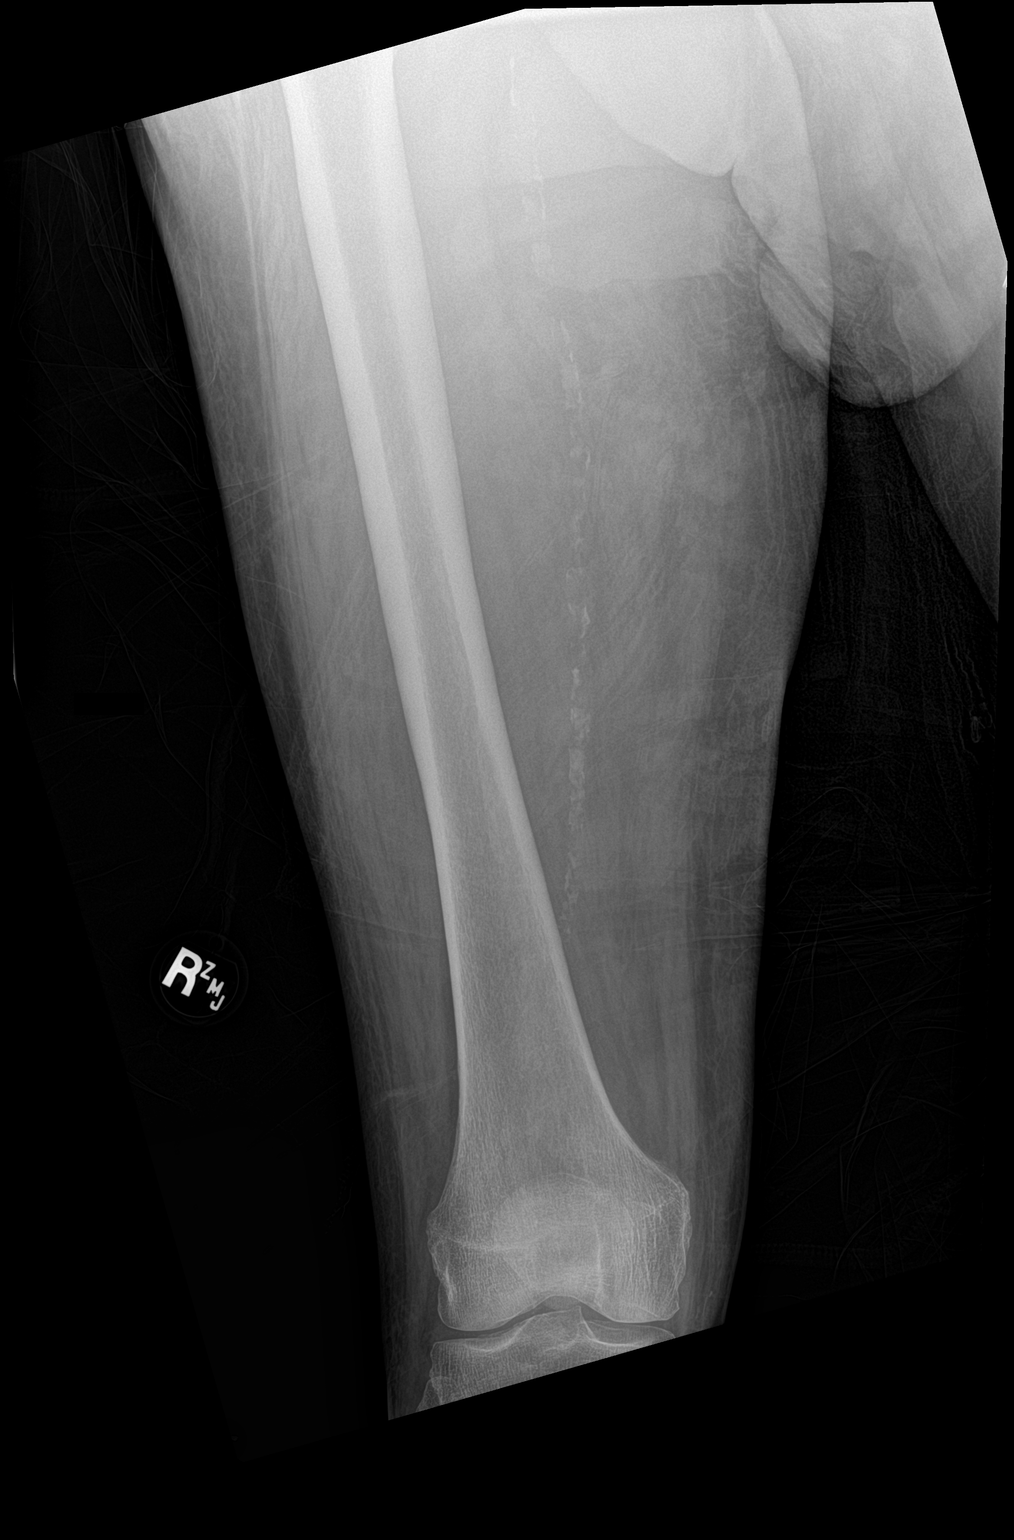

[2 of 2 positions shown; findings below may reference images not displayed]

FINDINGS: Mild degenerative spurring of the right femoral head. Regional
arterial atherosclerotic vascular calcifications noted. No fracture
or acute bony findings on this frontal projection.
IMPRESSION: 1. Frontal projection of the femur demonstrates no fracture or acute
bony findings.
2. Atherosclerosis.
3. Mild degenerative spurring of the right femoral head.

## 2022-02-15 IMAGING — DX DG KNEE 1-2V PORT*R*
1 series · 2 of 2 positions shown · non-contrast
Comparison: None.

CLINICAL DATA: Knee pain

EXAM:
PORTABLE RIGHT KNEE - 1-2 VIEW

[Series 1: knee · 0.14mm/px · 2 of 2 slices shown]
[im 1/2]
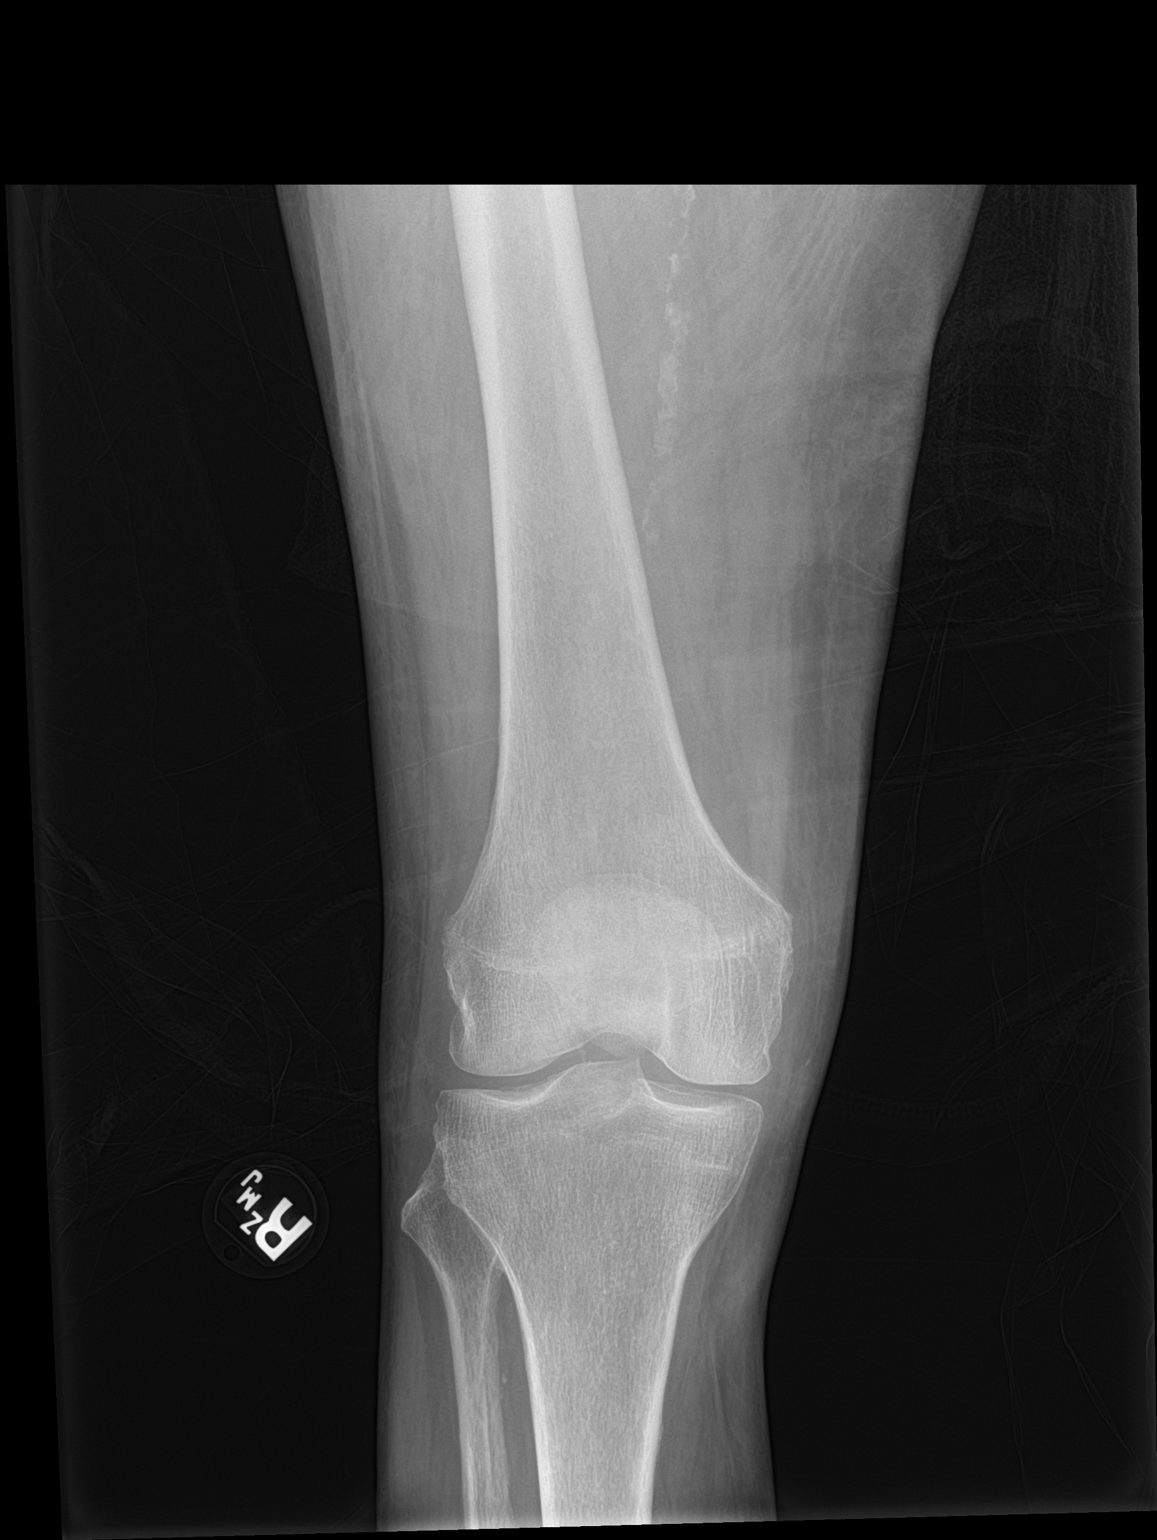
[im 2/2]
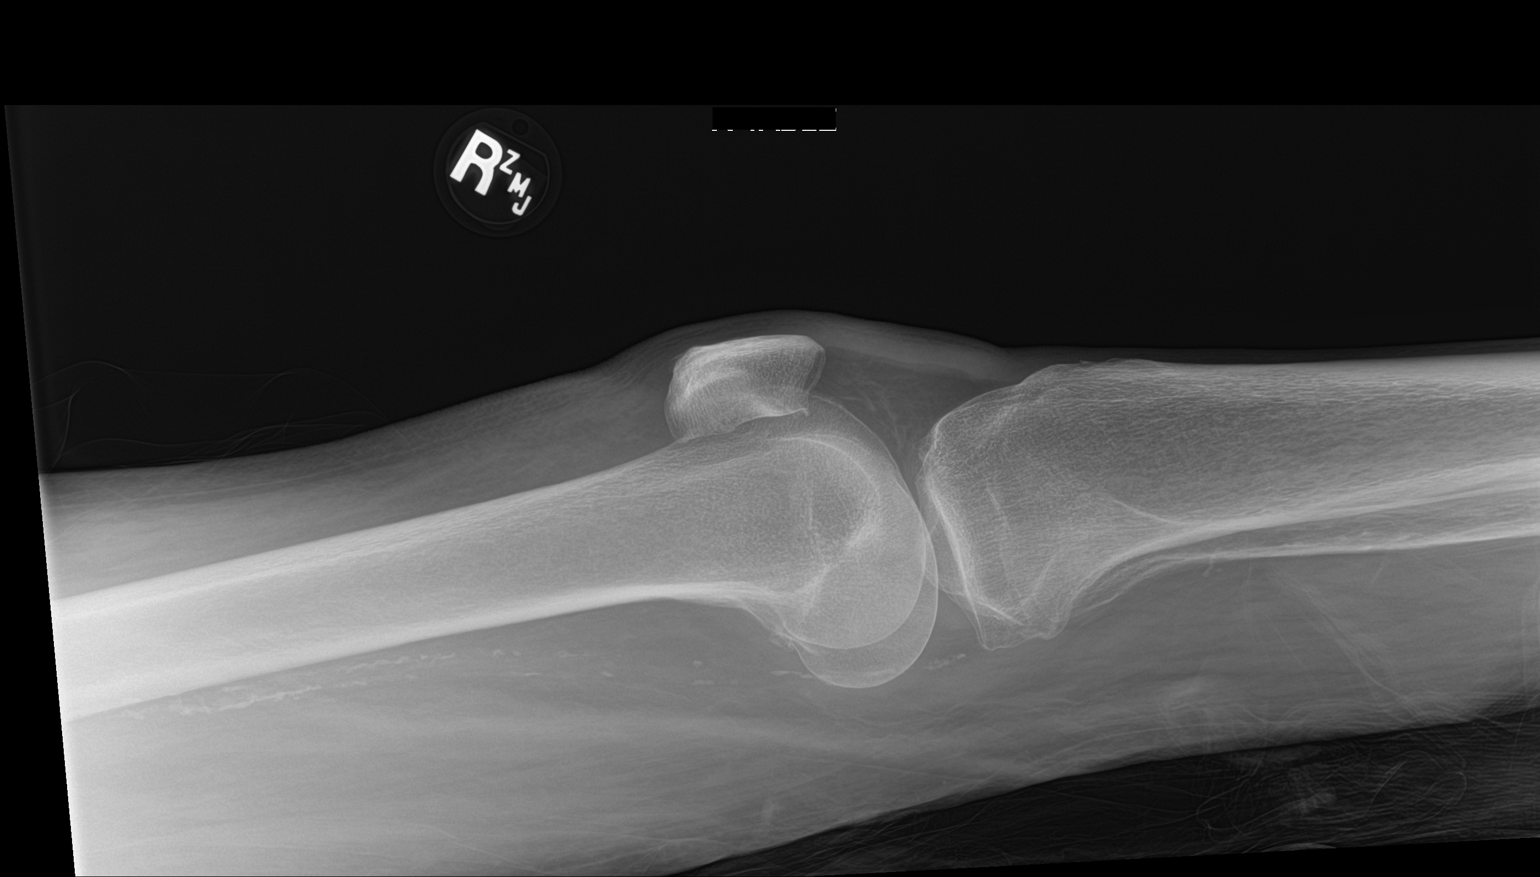

[2 of 2 positions shown; findings below may reference images not displayed]

FINDINGS: Two views of the right knee demonstrate regional atherosclerotic
calcifications. There is marginal spurring of the patella
potentially with some mild articular space loss in the
patellofemoral joint. The knee is fully extended during imaging, I
do not see a definite knee effusion although the extension may
mildly reduced sensitivity for small effusions. No fracture or acute
bony finding is observed.
IMPRESSION: 1. Mild osteoarthritis of the knee.  No acute findings.

## 2022-02-16 IMAGING — CT CT ABDOMEN W/O CM
2 of 4 series · 13 of 46 positions shown, 15 images · non-contrast
Comparison: Chest CT-12/21/2020; 08/27/2018

CLINICAL DATA: Evaluate anatomy prior to potential percutaneous
gastrostomy tube placement.

EXAM:
CT ABDOMEN WITHOUT CONTRAST
TECHNIQUE: Multidetector CT imaging of the abdomen was performed following the
standard protocol without IV contrast.

[Series 3: a/p w/o 5mm · axial · non-contrast · 0.79mm/px · z∈[+1029,+1219]mm · 10 of 48 slices shown, 12 images]
[im 5/48  soft-tissue]
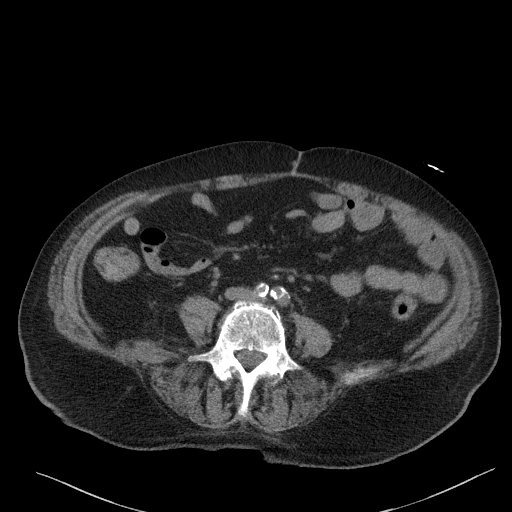
[im 5/48  bone]
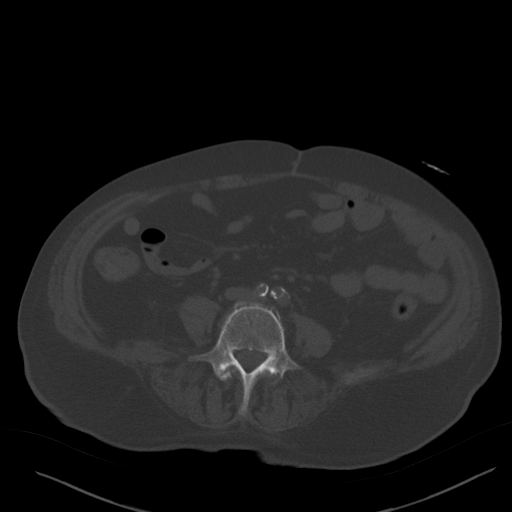
[im 9/48  soft-tissue]
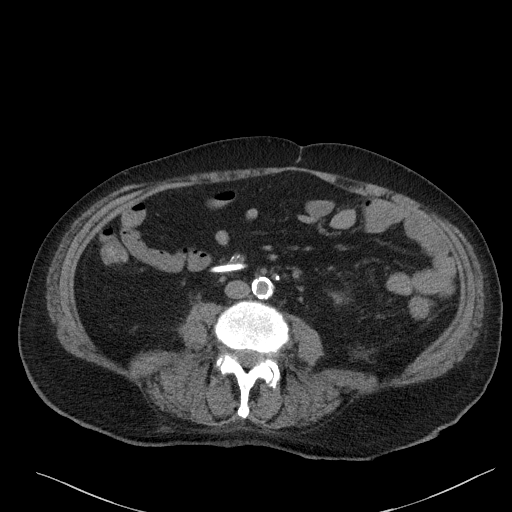
[im 13/48  soft-tissue]
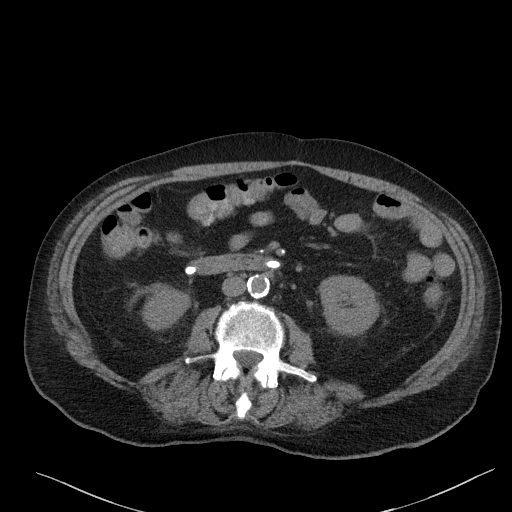
[im 17/48  soft-tissue]
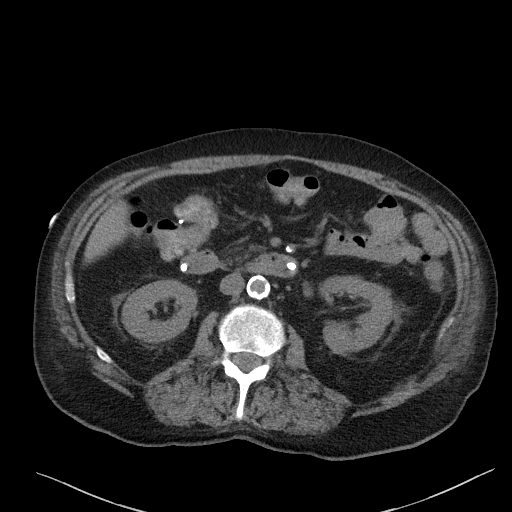
[im 21/48  soft-tissue]
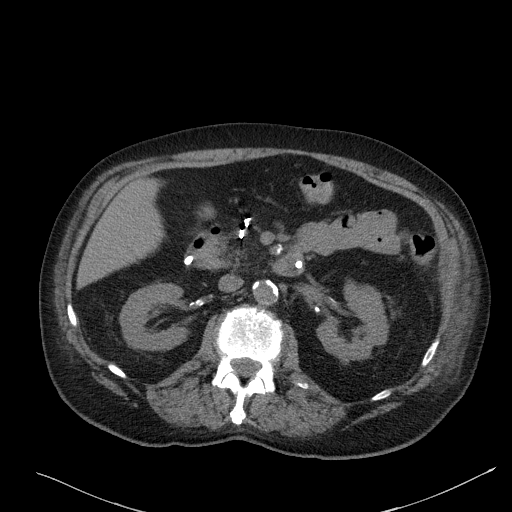
[im 27/48  soft-tissue]
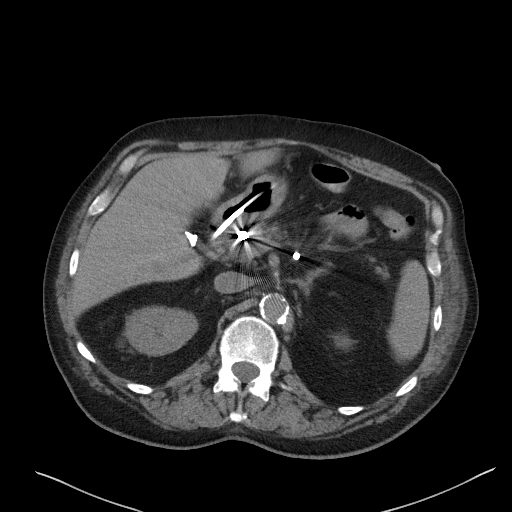
[im 31/48  soft-tissue]
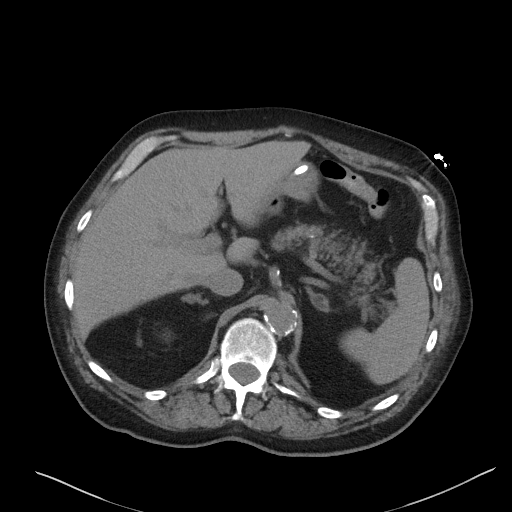
[im 35/48  soft-tissue]
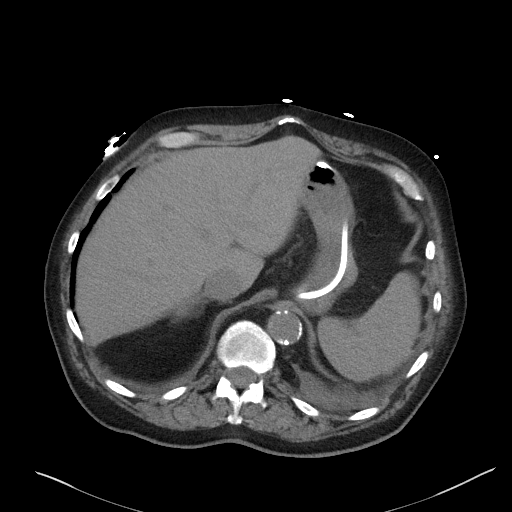
[im 39/48  soft-tissue]
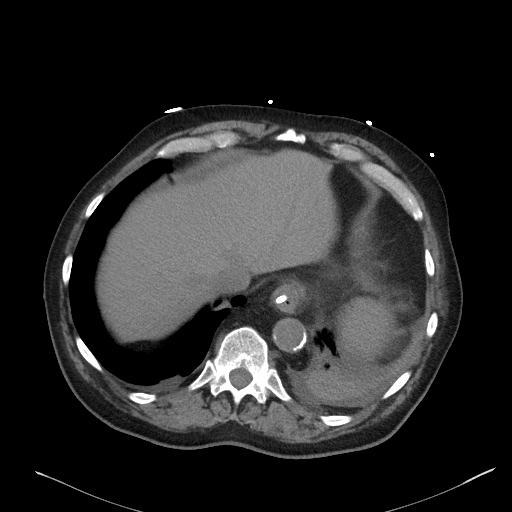
[im 39/48  bone]
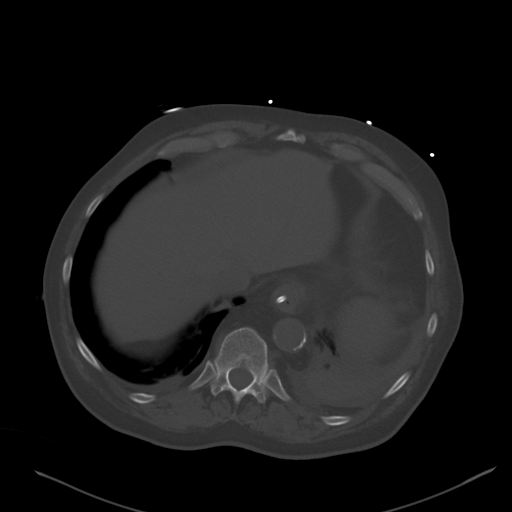
[im 43/48  soft-tissue]
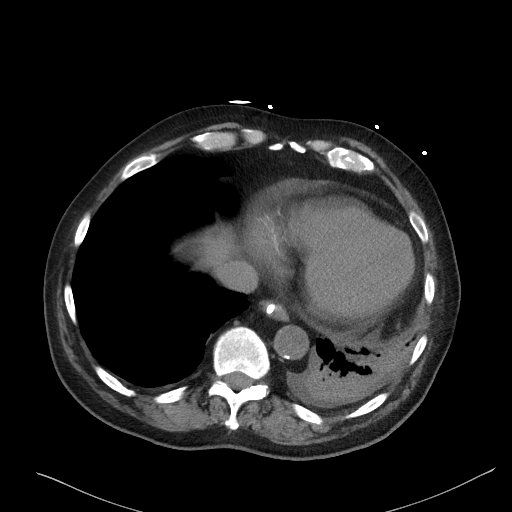

[Series 6: a/p w/o cor · coronal · non-contrast · 0.46mm/px · 3 of 151 slices shown]
[im 51/151  soft-tissue]
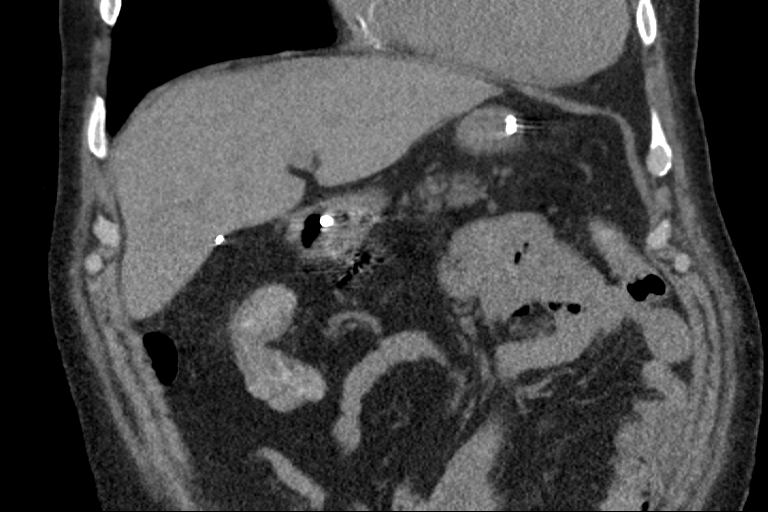
[im 67/151  soft-tissue]
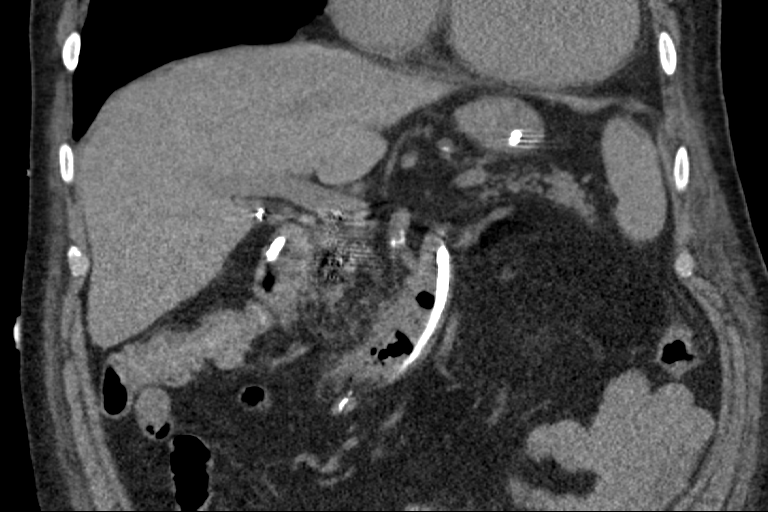
[im 84/151  soft-tissue]
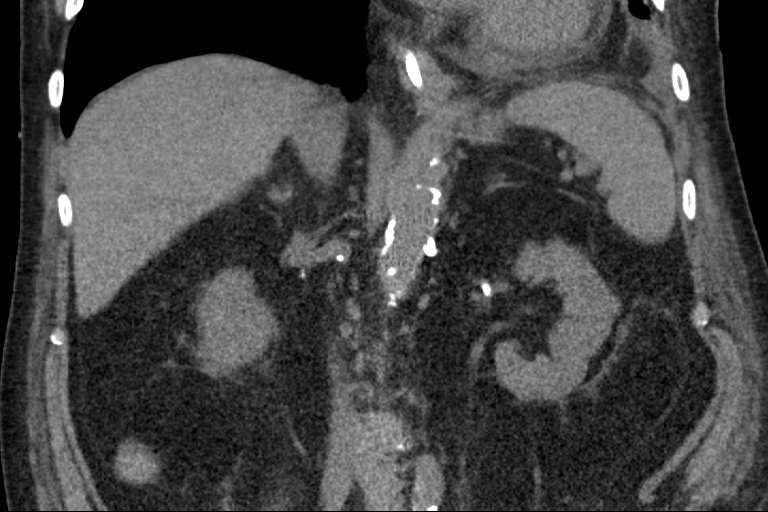

[13 of 46 positions shown; findings below may reference images not displayed]

FINDINGS: The lack of intravenous contrast limits the ability to evaluate
solid abdominal organs.

Lower chest: Limited visualization of the lower thorax demonstrates
left basilar consolidative opacities and volume loss with associated
air bronchograms. Trace bilateral pleural effusions.

Borderline cardiomegaly. Trace amount of pericardial fluid, similar
to the 08/27/2018 examination

Hepatobiliary: Normal hepatic contour. Post cholecystectomy. No
ascites.

Pancreas: The pancreas is partially fatty replaced.

Spleen: Normal noncontrast appearance of the spleen.

Adrenals/Urinary Tract: Subcentimeter hypoattenuating renal lesions
are seen bilaterally. There is an approximately 1.7 cm apparent
isoattenuating lesion involving the posteroinferior aspect the left
kidney (image 33, series 3. There is an approximately 1.9 cm
partially exophytic hypoattenuating lesion arising from the
posterosuperior aspect the right kidney (image 24, series 3), which
is incompletely characterized without intravenous contrast though
favored to represent a renal cyst. Additional subcentimeter
hypoattenuating lesions are too small to adequately characterize
though also favored to represent additional renal cysts.
Calcifications about the bilateral renal hila are favored to be
vascular in etiology. No evidence of nephrolithiasis. There is a
minimal amount of grossly symmetric likely age and body habitus
related perinephric stranding. No urinary obstruction.

Redemonstrated macroscopic fat containing approximately 1.6 cm
right-sided adrenal myelolipoma as well as an approximately 0.9 cm
nodule within the crux of the left adrenal gland (image 18, series
3), which is too small to accurately characterize though similar to
the 08/27/2018 examination and also favored to represent an adrenal
adenoma.

The urinary bladder was not imaged.

Stomach/Bowel: There is slight interposition of the hepatic
parenchyma and transverse colon between the anterior wall of the
stomach and ventral wall of the abdomen, however the percutaneous
window is felt to likely be improved with gastric insufflation.
Enteric tube tip terminates within the ascending portion of the
duodenum. No evidence of enteric obstruction. No pneumoperitoneum,
pneumatosis or portal venous gas.

Vascular/Lymphatic: Large amount of atherosclerotic plaque within
normal caliber abdominal aorta. Post percutaneous coil embolization
of the GDA.

Other: Apparent asymmetry involving the superior most aspect of the
right iliacus musculature (image 48, series 3), incompletely imaged
though potentially representative of a retroperitoneal hematoma.

Musculoskeletal: No acute or aggressive osseous abnormalities. Mild
multilevel lumbar spine DDD, worse at L2-L3 with disc space height
loss, endplate irregularity and sclerosis.
IMPRESSION: 1. Gastric anatomy felt to be amenable to attempted percutaneous
gastrostomy tube placement with improved percutaneous window with
gastric insufflation.
2. Apparent asymmetry involving the superior most aspect of the
right iliacus musculature, incompletely imaged, though potentially
representative of a retroperitoneal hematoma. Clinical correlation
is advised. Further evaluation with completion noncontrast CT of the
pelvis could be performed as indicated.
3.  Aortic Atherosclerosis ([id number]).
4. Left lower lobe volume loss, consolidative opacities and air
bronchograms, atelectasis versus infiltrate.
5. Suspected 1.7 cm isoattenuating left-sided renal lesion,
potentially a hyperdense renal cyst though conceivably a renal cell
carcinoma could have a similar appearance. Correlation with prior
outside examinations (if available), is advised. Further evaluation
with renal ultrasound could be performed as indicated.

## 2022-02-27 ENCOUNTER — Other Ambulatory Visit: Payer: Self-pay | Admitting: Family

## 2022-02-28 ENCOUNTER — Ambulatory Visit (INDEPENDENT_AMBULATORY_CARE_PROVIDER_SITE_OTHER): Payer: Medicare HMO | Admitting: Nurse Practitioner

## 2022-02-28 ENCOUNTER — Encounter: Payer: Self-pay | Admitting: Nurse Practitioner

## 2022-02-28 ENCOUNTER — Ambulatory Visit (INDEPENDENT_AMBULATORY_CARE_PROVIDER_SITE_OTHER): Payer: Medicare HMO

## 2022-02-28 VITALS — BP 165/74 | HR 66 | Temp 97.6°F | Resp 20 | Ht 69.0 in | Wt 174.0 lb

## 2022-02-28 DIAGNOSIS — R059 Cough, unspecified: Secondary | ICD-10-CM | POA: Diagnosis not present

## 2022-02-28 DIAGNOSIS — R079 Chest pain, unspecified: Secondary | ICD-10-CM | POA: Diagnosis not present

## 2022-02-28 NOTE — Patient Instructions (Signed)
Angina  Angina is discomfort or pain in the chest, neck, arm, jaw, or back. The discomfort is caused by a lack of blood in the middle layer of the heart wall. The middle layer of the heart wall is called the myocardium. What are the causes? This condition is caused by a buildup of fat and cholesterol, or plaque, in your arteries. This buildup narrows the arteries and makes it hard for blood to flow. What increases the risk? Main risks High levels of cholesterol in your blood. High blood pressure. Diabetes. Family history of heart disease. Not exercising or moving enough. Depression. Having had radiation treatment on the left side of your chest. Other risks Tobacco use. Too much body weight (obesity). A diet that is high in unhealthy fats (saturated fats). Stress. Using drugs, such as cocaine. Risks for women Being older than 55 years. Being in menopause. This is the time when a woman no longer has a menstrual period. What are the signs or symptoms? Symptoms in all people Chest pain, which may: Feel like a crushing or squeezing in the chest. Feel like a tightness, pressure, fullness, or heaviness in the chest. Last for more than a few minutes at a time. Stop and come back (recur) after a few minutes. Pain in the neck, arm, jaw, or back. Heartburn or upset stomach (indigestion) for no reason. Being short of breath. Feeling like you may vomit (nauseous). Sudden cold sweats. Symptoms in women and people with diabetes Tiredness. Worry and anxiety. Weakness. Dizziness or fainting. How is this treated? This condition may be treated with: Medicines. These can be given to prevent blood clots, stop a heart attack, lower blood pressure, or treat other risk factors. A procedure to widen a narrowed or blocked artery in the heart. Surgery to allow blood to go around a blocked artery. Follow these instructions at home: Medicines Take over-the-counter and prescription medicines only as  told by your doctor. Do not take these medicines unless your doctor says that you can: NSAIDs. These include: Ibuprofen. Naproxen. Vitamin supplements that have vitamin A, vitamin E, or both. Hormone therapy that contains estrogen with or without progestin. Eating and drinking  Eat a heart-healthy diet that includes: Lots of fresh fruits and vegetables. Whole grains. Low-fat (lean) protein. Low-fat dairy products. Follow instructions from your doctor about what you cannot eat or drink. Activity Follow an exercise program that your doctor tells you. Talk with your doctor about joining a program to make your heart strong again (cardiac rehab). When you feel tired, take a break. Plan breaks if you know you are going to feel tired. Lifestyle  Do not smoke or use any products that contain nicotine or tobacco. If you need help quitting, ask your doctor. If your doctor says you can drink alcohol: Limit how much you have to: 0-1 drink a day for women who are not pregnant. 0-2 drinks a day for men. Know how much alcohol is in your drink. In the [place] one drink equals one 12 oz bottle of beer (355 mL), one 5 oz glass of wine (148 mL), or one 1 oz glass of hard liquor (44 mL). General instructions Stay at a healthy weight. If told to lose weight, work with your doctor to do lose weight safely. Learn to manage stress. If you need help, ask your doctor. Keep your vaccines up to date. Get a flu shot every year. Talk with your doctor if you feel sad. Take a screening test to see if you  are at risk for depression. Work with your doctor to manage any other health problems that you have. These may include diabetes or high blood pressure. Keep all follow-up visits. Get help right away if: You have pain in your chest, neck, arm, jaw, or back, and the pain: Lasts more than a few minutes. Comes back. Does not get better after you take medicine under your tongue (sublingual nitroglycerin). Keeps  getting worse. Comes more often. You have any of these problems: Sweating a lot. Heartburn or upset stomach. Shortness of breath. Trouble breathing. Feeling like you may vomit. Vomiting. Feeling more tired than normal. Feeling nervous or worrying more than normal. Weakness. You are dizzy or light-headed all of a sudden. You faint. These symptoms may be an emergency. Get help right away. Call your local emergency services (911 in the [place] Do not wait to see if the symptoms will go away. Do not drive yourself to the hospital. Summary Angina is discomfort or pain in the chest, neck, arm, neck, or back. Symptoms include chest pain, heartburn or upset stomach, and shortness of breath. Women or people with diabetes may have other symptoms, such as feeling nervous, being worried, or being weak or tired. Take all medicines only as told by your doctor. You should eat a heart-healthy diet and follow an exercise program. This information is not intended to replace advice given to you by your health care provider. Make sure you discuss any questions you have with your health care provider. Document Revised: 11/18/2019 Document Reviewed: 11/18/2019 Elsevier Patient Education  [place].

## 2022-02-28 NOTE — Progress Notes (Signed)
   Subjective:    Patient ID: Ronald [name], male    DOB: 09/03/1945, 76 y.o.   MRN: [id number]   Chief Complaint: Chest Pain and Shortness of Breath   Chest Pain  Associated symptoms include shortness of breath. Pertinent negatives include no palpitations.  Shortness of Breath Associated symptoms include chest pain.   Patient brought in by his son with c/o chest pain and SOB. Patient says he cannot catch his breath. He is on oxygen at home. He has been having chest pain off and on for months. His sob has increases. His son says he has been taking nitroglycerin at home. He has only been using his oxygen at night and has still been smoking.     Review of Systems  Respiratory:  Positive for chest tightness and shortness of breath.   Cardiovascular:  Positive for chest pain. Negative for palpitations.       Objective:   Physical Exam Constitutional:      Appearance: He is well-developed.  Cardiovascular:     Rate and Rhythm: Normal rate and regular rhythm.     Heart sounds: Normal heart sounds.  Pulmonary:     Effort: Pulmonary effort is normal.     Breath sounds: Normal breath sounds.  Skin:    General: Skin is warm.  Neurological:     General: No focal deficit present.     Mental Status: He is alert and oriented to person, place, and time.  Psychiatric:        Mood and Affect: Mood normal.        Behavior: Behavior normal.      BP (!) 165/74   Pulse 66   Temp 97.6 F (36.4 C) (Temporal)   Resp 20   Ht 5' 9"  (1.753 m)   Wt 174 lb (78.9 kg)   SpO2 95%   BMI 25.70 kg/m   Chest xray normal-Mary-Margaret Hassell Done, FNP EKG- ST depression     Assessment & Plan:  Ronald [name] in today with chief complaint of Chest Pain and Shortness of Breath   1. Chest pain, unspecified type Needs  to go to the ED to have troponin levels checked Needs to wear oxygen more STOP SMOKING - EKG 12-Lead - DG Chest 2 View    The above assessment and management plan was discussed  with the patient. The patient verbalized understanding of and has agreed to the management plan. Patient is aware to call the clinic if symptoms persist or worsen. Patient is aware when to return to the clinic for a follow-up visit. Patient educated on when it is appropriate to go to the emergency department.   Mary-Margaret Hassell Done, FNP

## 2022-03-04 DIAGNOSIS — Z515 Encounter for palliative care: Secondary | ICD-10-CM | POA: Diagnosis not present

## 2022-03-04 DIAGNOSIS — J9612 Chronic respiratory failure with hypercapnia: Secondary | ICD-10-CM | POA: Diagnosis not present

## 2022-03-04 DIAGNOSIS — J9611 Chronic respiratory failure with hypoxia: Secondary | ICD-10-CM | POA: Diagnosis not present

## 2022-03-04 DIAGNOSIS — I5032 Chronic diastolic (congestive) heart failure: Secondary | ICD-10-CM | POA: Diagnosis not present

## 2022-03-04 DIAGNOSIS — I5030 Unspecified diastolic (congestive) heart failure: Secondary | ICD-10-CM | POA: Diagnosis not present

## 2022-03-04 DIAGNOSIS — J449 Chronic obstructive pulmonary disease, unspecified: Secondary | ICD-10-CM | POA: Diagnosis not present

## 2022-03-06 DIAGNOSIS — J439 Emphysema, unspecified: Secondary | ICD-10-CM | POA: Diagnosis not present

## 2022-03-17 ENCOUNTER — Emergency Department (HOSPITAL_COMMUNITY): Payer: Medicare HMO

## 2022-03-17 ENCOUNTER — Encounter (HOSPITAL_COMMUNITY): Payer: Self-pay | Admitting: Emergency Medicine

## 2022-03-17 ENCOUNTER — Emergency Department (HOSPITAL_COMMUNITY)
Admission: EM | Admit: 2022-03-17 | Discharge: 2022-03-17 | Disposition: A | Discharge status: Home / Self Care | Payer: Medicare HMO | Attending: Emergency Medicine | Admitting: Emergency Medicine

## 2022-03-17 ENCOUNTER — Other Ambulatory Visit: Payer: Self-pay

## 2022-03-17 DIAGNOSIS — R0600 Dyspnea, unspecified: Secondary | ICD-10-CM

## 2022-03-17 DIAGNOSIS — J449 Chronic obstructive pulmonary disease, unspecified: Secondary | ICD-10-CM | POA: Diagnosis not present

## 2022-03-17 DIAGNOSIS — R7989 Other specified abnormal findings of blood chemistry: Secondary | ICD-10-CM | POA: Diagnosis not present

## 2022-03-17 DIAGNOSIS — R0602 Shortness of breath: Secondary | ICD-10-CM | POA: Diagnosis not present

## 2022-03-17 DIAGNOSIS — Z7901 Long term (current) use of anticoagulants: Secondary | ICD-10-CM | POA: Insufficient documentation

## 2022-03-17 DIAGNOSIS — Z7982 Long term (current) use of aspirin: Secondary | ICD-10-CM | POA: Diagnosis not present

## 2022-03-17 DIAGNOSIS — I5020 Unspecified systolic (congestive) heart failure: Secondary | ICD-10-CM | POA: Diagnosis not present

## 2022-03-17 DIAGNOSIS — J9811 Atelectasis: Secondary | ICD-10-CM | POA: Diagnosis not present

## 2022-03-17 DIAGNOSIS — I110 Hypertensive heart disease with heart failure: Secondary | ICD-10-CM | POA: Diagnosis not present

## 2022-03-17 DIAGNOSIS — I509 Heart failure, unspecified: Secondary | ICD-10-CM | POA: Diagnosis not present

## 2022-03-17 LAB — CBC WITH DIFFERENTIAL/PLATELET
Abs Immature Granulocytes: 0.01 10*3/uL (ref 0.00–0.07)
Basophils Absolute: 0 10*3/uL (ref 0.0–0.1)
Basophils Relative: 1 %
Eosinophils Absolute: 0.1 10*3/uL (ref 0.0–0.5)
Eosinophils Relative: 1 %
HCT: 39.1 % (ref 39.0–52.0)
Hemoglobin: 12.4 g/dL — ABNORMAL LOW (ref 13.0–17.0)
Immature Granulocytes: 0 %
Lymphocytes Relative: 10 %
Lymphs Abs: 0.8 10*3/uL (ref 0.7–4.0)
MCH: 26.2 pg (ref 26.0–34.0)
MCHC: 31.7 g/dL (ref 30.0–36.0)
MCV: 82.5 fL (ref 80.0–100.0)
Monocytes Absolute: 0.7 10*3/uL (ref 0.1–1.0)
Monocytes Relative: 8 %
Neutro Abs: 6.7 10*3/uL (ref 1.7–7.7)
Neutrophils Relative %: 80 %
Platelets: 378 10*3/uL (ref 150–400)
RBC: 4.74 MIL/uL (ref 4.22–5.81)
RDW: 14 % (ref 11.5–15.5)
WBC: 8.3 10*3/uL (ref 4.0–10.5)
nRBC: 0 % (ref 0.0–0.2)

## 2022-03-17 LAB — BASIC METABOLIC PANEL
Anion gap: 7 (ref 5–15)
BUN: 12 mg/dL (ref 8–23)
CO2: 33 mmol/L — ABNORMAL HIGH (ref 22–32)
Calcium: 8.3 mg/dL — ABNORMAL LOW (ref 8.9–10.3)
Chloride: 102 mmol/L (ref 98–111)
Creatinine, Ser: 1.55 mg/dL — ABNORMAL HIGH (ref 0.61–1.24)
GFR, Estimated: 47 mL/min — ABNORMAL LOW (ref >60)
Glucose, Bld: 179 mg/dL — ABNORMAL HIGH (ref 70–99)
Potassium: 2.9 mmol/L — ABNORMAL LOW (ref 3.5–5.1)
Sodium: 142 mmol/L (ref 135–145)

## 2022-03-17 LAB — BRAIN NATRIURETIC PEPTIDE: B Natriuretic Peptide: 375 pg/mL — ABNORMAL HIGH (ref 0.0–100.0)

## 2022-03-17 LAB — TROPONIN I (HIGH SENSITIVITY)
Troponin I (High Sensitivity): 74 ng/L — ABNORMAL HIGH (ref <18)
Troponin I (High Sensitivity): 68 ng/L — ABNORMAL HIGH (ref <18)

## 2022-03-17 MED ORDER — POTASSIUM CHLORIDE CRYS ER 20 MEQ PO TBCR
40.0000 meq | EXTENDED_RELEASE_TABLET | Freq: Once | ORAL | Status: AC
  Administered 2022-03-17: 40 meq via ORAL
  Filled 2022-03-17: qty 2

## 2022-03-17 MED ORDER — MAGNESIUM SULFATE 2 GM/50ML IV SOLN
2.0000 g | Freq: Once | INTRAVENOUS | Status: AC
  Administered 2022-03-17: 2 g via INTRAVENOUS
  Filled 2022-03-17: qty 50

## 2022-03-17 MED ORDER — METHYLPREDNISOLONE SODIUM SUCC 125 MG IJ SOLR
125.0000 mg | Freq: Once | INTRAMUSCULAR | Status: AC
  Administered 2022-03-17: 125 mg via INTRAVENOUS
  Filled 2022-03-17: qty 2

## 2022-03-17 NOTE — ED Notes (Signed)
Pt RA sat 87%. Pt placed on 2L [place]. Pt reports wearing 3L [place] as needed at home.

## 2022-03-17 NOTE — Discharge Instructions (Signed)
Double up on your fluid pill and your potassium.  Return to the ED if any problems.  Follow-up with cardiology this week

## 2022-03-17 NOTE — ED Provider Notes (Signed)
[facility] EMERGENCY DEPARTMENT Provider Note   CSN: [id number] Arrival date & time: 03/17/22  1312     History {Add pertinent medical, surgical, social history, OB history to HPI:1} Chief Complaint  Patient presents with   Shortness of Breath    Ronald [name] is a 76 y.o. male.  Patient complains of some shortness of breath.  Has a history of heart failure and COPD   Shortness of Breath      Home Medications Prior to Admission medications   Medication Sig Start Date End Date Taking? Authorizing Provider  acetaminophen (TYLENOL) 325 MG tablet Take 2 tablets (650 mg total) by mouth every 6 (six) hours as needed for mild pain (or Fever >/= 101). 12/10/21   Emokpae, Courage, MD  albuterol (VENTOLIN HFA) 108 (90 Base) MCG/ACT inhaler Inhale 2 puffs into the lungs every 6 (six) hours as needed for wheezing or shortness of breath. 12/10/21   Roxan Hockey, MD  ALPRAZolam Duanne Moron) 0.25 MG tablet Take 1 tablet (0.25 mg total) by mouth 2 (two) times daily as needed for anxiety. 01/24/22   [name], FNP  amiodarone (PACERONE) 200 MG tablet Take 1 tablet by mouth once daily 02/13/22   [name] A, FNP  aspirin EC 81 MG EC tablet Take 1 tablet (81 mg total) by mouth daily. Swallow whole. 09/05/21   Phillips Grout, MD  bisoprolol (ZEBETA) 5 MG tablet Take 1 tablet (5 mg total) by mouth daily. In Place of Metoprolol 11/26/21   Emokpae, Courage, MD  budesonide (PULMICORT) 0.5 MG/2ML nebulizer solution Take 2 mLs (0.5 mg total) by nebulization 2 (two) times daily. 12/10/21   Roxan Hockey, MD  dextromethorphan-guaiFENesin (MUCINEX DM) 30-600 MG 12hr tablet Take 1 tablet by mouth 2 (two) times daily. 12/10/21   Roxan Hockey, MD  ELIQUIS 5 MG TABS tablet Take 1 tablet by mouth twice daily 02/28/22   [name] A, FNP  empagliflozin (JARDIANCE) 10 MG TABS tablet Take 1 tablet (10 mg total) by mouth daily before breakfast. 12/19/21   [name], FNP  furosemide (LASIX) 40 MG  tablet Take 1 tablet (40 mg total) by mouth 2 (two) times daily. 12/17/21   [name] A, FNP  glucose blood (ONE TOUCH ULTRA TEST) test strip USE TO CHECK GLUCOSE ONCE DAILY 02/11/18   Terald Sleeper, PA-C  ipratropium-albuterol (DUONEB) 0.5-2.5 (3) MG/3ML SOLN Take 3 mLs by nebulization every 6 (six) hours as needed (asthma). 12/10/21   Roxan Hockey, MD  isosorbide mononitrate (ISMO) 10 MG tablet Take 1 tablet (10 mg total) by mouth 2 (two) times daily. 02/28/22   [name], FNP  losartan (COZAAR) 100 MG tablet Take 1 tablet (100 mg total) by mouth daily. 08/14/21   [name], FNP  nitroGLYCERIN (NITROSTAT) 0.4 MG SL tablet Place 1 tablet (0.4 mg total) under the tongue every 5 (five) minutes as needed for chest pain. 12/17/21   [name], FNP  omeprazole (PRILOSEC) 20 MG capsule Take 1 capsule (20 mg total) by mouth daily. 11/26/21   Roxan Hockey, MD  OXYGEN Inhale 3 L into the lungs at bedtime as needed (shortness of breath).    [provider]  polyethylene glycol (MIRALAX / GLYCOLAX) 17 g packet Take 17 g by mouth daily. 12/10/21   Roxan Hockey, MD  potassium chloride (KLOR-CON) 20 MEQ packet Take 20 mEq by mouth 2 (two) times daily. 12/24/21   [name], FNP  rosuvastatin (CRESTOR) 20 MG  tablet Take 1 tablet (20 mg total) by mouth daily. 11/26/21 05/25/22  Roxan Hockey, MD  triamcinolone ointment (KENALOG) 0.5 % Apply 1 Application topically 2 (two) times daily. 01/24/22   [name] A, FNP  potassium chloride SA (KLOR-CON M) 20 MEQ tablet Take 1 tablet (20 mEq total) by mouth 2 (two) times daily. 12/19/21 12/24/21  [name], FNP      Allergies    Gabapentin and Metformin and related    Review of Systems   Review of Systems  Respiratory:  Positive for shortness of breath.     Physical Exam Updated Vital Signs BP (!) 176/76   Pulse 71   Temp 98.2 F (36.8 C) (Oral)   Resp (!) 22   Ht 5' 9"  (1.753 m)   Wt 78.9 kg   SpO2 96%    BMI 25.70 kg/m  Physical Exam  ED Results / Procedures / Treatments   Labs (all labs ordered are listed, but only abnormal results are displayed) Labs Reviewed  CBC WITH DIFFERENTIAL/PLATELET - Abnormal; Notable for the following components:      Result Value   Hemoglobin 12.4 (*)    All other components within normal limits  BASIC METABOLIC PANEL - Abnormal; Notable for the following components:   Potassium 2.9 (*)    CO2 33 (*)    Glucose, Bld 179 (*)    Creatinine, Ser 1.55 (*)    Calcium 8.3 (*)    GFR, Estimated 47 (*)    All other components within normal limits  BRAIN NATRIURETIC PEPTIDE - Abnormal; Notable for the following components:   B Natriuretic Peptide 375.0 (*)    All other components within normal limits  TROPONIN I (HIGH SENSITIVITY) - Abnormal; Notable for the following components:   Troponin I (High Sensitivity) 74 (*)    All other components within normal limits  TROPONIN I (HIGH SENSITIVITY) - Abnormal; Notable for the following components:   Troponin I (High Sensitivity) 68 (*)    All other components within normal limits    EKG None  Radiology DG Chest Portable 1 View  Result Date: 03/17/2022 CLINICAL DATA:  Shortness of breath EXAM: PORTABLE CHEST 1 VIEW COMPARISON:  Chest radiograph February 28, 2022 FINDINGS: Stable cardiac and mediastinal contours. Aortic atherosclerosis. Basilar heterogeneous opacities. No pleural effusion or pneumothorax. Osseous structures unremarkable. IMPRESSION: Basilar atelectasis.  No acute process. Electronically Signed   By: Lovey Newcomer M.D.   On: 03/17/2022 13:53    Procedures Procedures  {Document cardiac monitor, telemetry assessment procedure when appropriate:1}  Medications Ordered in ED Medications  methylPREDNISolone sodium succinate (SOLU-MEDROL) 125 mg/2 mL injection 125 mg (125 mg Intravenous Given 03/17/22 1621)  magnesium sulfate IVPB 2 g 50 mL (0 g Intravenous Stopped 03/17/22 1847)  potassium chloride  SA (KLOR-CON M) CR tablet 40 mEq (40 mEq Oral Given 03/17/22 2221)    ED Course/ Medical Decision Making/ A&P                           Medical Decision Making Amount and/or Complexity of Data Reviewed Labs: ordered. Radiology: ordered.  Risk Prescription drug management.   Patient with worsening heart failure and COPD.  He improved with neb treatments.  Troponins have been flat.  I recommended patient be admitted to the hospital and be observed but he refused admission.  He will increase his fluid pill and his potassium and see his cardiologist this week and return if  problems  {Document critical care time when appropriate:1} {Document review of labs and clinical decision tools ie heart score, Chads2Vasc2 etc:1}  {Document your independent review of radiology images, and any outside records:1} {Document your discussion with family members, caretakers, and with consultants:1} {Document social determinants of health affecting pt's care:1} {Document your decision making why or why not admission, treatments were needed:1} Final Clinical Impression(s) / ED Diagnoses Final diagnoses:  Dyspnea, unspecified type  Systolic congestive heart failure, unspecified HF chronicity ([place])    Rx / DC Orders ED Discharge Orders     None

## 2022-03-20 ENCOUNTER — Other Ambulatory Visit: Payer: Self-pay | Admitting: Family

## 2022-03-26 NOTE — Progress Notes (Deleted)
Cardiology Office Note    Date:  03/26/2022   ID:  JACAI KIPP, DOB 06-30-1945, MRN [id number]  PCP:  [name], FNP  Cardiologist: Carlyle Dolly, MD    No chief complaint on file.   History of Present Illness:    Ronald [name] is a 76 y.o. male with past medical history of CAD (s/p NSTEMI in 05/2014 with DESx2 to LAD, cath in 05/2020 showing patent stents and moderate disease along the LAD and RCA with medical management recommended), paroxysmal atrial fibrillation, HTN, HLD, Type 2 DM, COPD and prior CVA who presents to the office today for overdue follow-up.   He was last examined by the Cardiology service during an admission in 08/2021 for acute hypoxic respiratory failure in the setting of PNA, CHF and COPD exacerbation.  Cardiology was consulted during admission as troponin values were elevated and peaked at 2161 and was felt to be secondary due to demand ischemia in the setting of known CAD and his acute respiratory failure.  Given no active ischemia, continue medical therapy was recommended at that time.  He was transitioned to Lasix 60 mg daily at the time of discharge.  He was hospitalized again in 11/2021 for an acute on chronic diastolic CHF exacerbation and AKI.  Weight at the time of discharge had improved to 81.6 kg and he was discharged on Lasix 40 mg daily.  He had a recurrent admission 2 weeks later for a CHF exacerbation and responded well with IV Lasix. PO Lasix was increased to 40 mg in AM/20 mg in PM at the time of discharge.  Past Medical History:  Diagnosis Date   Anxiety    Asthma    Atrial flutter ([place]) 04/2021   Chronic diastolic CHF (congestive heart failure) (HCC)    Chronic lower back pain    COPD (chronic obstructive pulmonary disease) (HCC)    Coronary artery disease    a. NSTEMI 05/2014 s/p DESx2 to LAD at [facility].   Depression    GERD (gastroesophageal reflux disease)    High cholesterol    History of tracheostomy    Hypertension     NSTEMI (non-ST elevated myocardial infarction) ([place]) 05/2014   with stent placement   PAF (paroxysmal atrial fibrillation) ([place])    Sleep apnea    Stroke ([place]) 2017   anyeusym    TIA (transient ischemic attack)    "they say I've had some mini strokes; don't know when"; denies residual on 06/22/2014)   Tobacco abuse    Type II diabetes mellitus ([employer])    Ulcerative colitis ([place])     Past Surgical History:  Procedure Laterality Date   APPENDECTOMY     BIOPSY  07/20/2020   Procedure: BIOPSY;  Surgeon: Harvel Quale, MD;  [facility];  Service: Gastroenterology;;   CARDIAC CATHETERIZATION  [phone] X 3   CHOLECYSTECTOMY     COLONOSCOPY WITH PROPOFOL N/A 07/20/2020   Procedure: COLONOSCOPY WITH PROPOFOL;  Surgeon: Harvel Quale, MD;  [facility];  Service: Gastroenterology;  Laterality: N/A;  1:15   CORONARY ANGIOPLASTY WITH STENT PLACEMENT  05/2014   "2"   ESOPHAGEAL DILATION N/A 07/20/2020   Procedure: ESOPHAGEAL DILATION;  Surgeon: Harvel Quale, MD;  [facility];  Service: Gastroenterology;  Laterality: N/A;   ESOPHAGOGASTRODUODENOSCOPY (EGD) WITH PROPOFOL N/A 07/20/2020   Procedure: ESOPHAGOGASTRODUODENOSCOPY (EGD) WITH PROPOFOL;  Surgeon: Harvel Quale, MD;  [facility];  Service: Gastroenterology;  Laterality:  N/A;   IR GASTROSTOMY TUBE MOD SED  06/04/2021   IR GASTROSTOMY TUBE REMOVAL  07/24/2021   LEFT HEART CATH AND CORONARY ANGIOGRAPHY N/A 05/25/2020   Procedure: LEFT HEART CATH AND CORONARY ANGIOGRAPHY;  Surgeon: Martinique, Peter M, MD;  Location: [place] CV [facility];  Service: Cardiovascular;  Laterality: N/A;   POLYPECTOMY  07/20/2020   Procedure: POLYPECTOMY INTESTINAL;  Surgeon: Montez Morita, Quillian Quince, MD;  [facility];  Service: Gastroenterology;;   TUMOR EXCISION Right ~ 1999   "side of my upper head"    Current Medications: Outpatient Medications Prior to Visit   Medication Sig Dispense Refill   acetaminophen (TYLENOL) 325 MG tablet Take 2 tablets (650 mg total) by mouth every 6 (six) hours as needed for mild pain (or Fever >/= 101). 12 tablet 0   albuterol (VENTOLIN HFA) 108 (90 Base) MCG/ACT inhaler Inhale 2 puffs into the lungs every 6 (six) hours as needed for wheezing or shortness of breath. 18 g 2   ALPRAZolam (XANAX) 0.25 MG tablet Take 1 tablet (0.25 mg total) by mouth 2 (two) times daily as needed for anxiety. 60 tablet 2   amiodarone (PACERONE) 200 MG tablet Take 1 tablet by mouth once daily 90 tablet 0   aspirin EC 81 MG EC tablet Take 1 tablet (81 mg total) by mouth daily. Swallow whole. 30 tablet 11   bisoprolol (ZEBETA) 5 MG tablet Take 1 tablet (5 mg total) by mouth daily. In Place of Metoprolol 90 tablet 3   budesonide (PULMICORT) 0.5 MG/2ML nebulizer solution Take 2 mLs (0.5 mg total) by nebulization 2 (two) times daily. 100 mL 12   dextromethorphan-guaiFENesin (MUCINEX DM) 30-600 MG 12hr tablet Take 1 tablet by mouth 2 (two) times daily. 20 tablet 0   ELIQUIS 5 MG TABS tablet Take 1 tablet by mouth twice daily 60 tablet 2   empagliflozin (JARDIANCE) 10 MG TABS tablet Take 1 tablet (10 mg total) by mouth daily before breakfast. 90 tablet 1   furosemide (LASIX) 40 MG tablet Take 1 tablet (40 mg total) by mouth 2 (two) times daily. 60 tablet 3   glucose blood (ONE TOUCH ULTRA TEST) test strip USE TO CHECK GLUCOSE ONCE DAILY 100 each 3   ipratropium-albuterol (DUONEB) 0.5-2.5 (3) MG/3ML SOLN Take 3 mLs by nebulization every 6 (six) hours as needed (asthma). 360 mL 3   isosorbide mononitrate (ISMO) 10 MG tablet Take 1 tablet (10 mg total) by mouth 2 (two) times daily. 180 tablet 0   losartan (COZAAR) 100 MG tablet Take 1 tablet (100 mg total) by mouth daily. 90 tablet 1   nitroGLYCERIN (NITROSTAT) 0.4 MG SL tablet Place 1 tablet (0.4 mg total) under the tongue every 5 (five) minutes as needed for chest pain. 20 tablet 1   omeprazole (PRILOSEC)  20 MG capsule Take 1 capsule (20 mg total) by mouth daily. 90 capsule 3   OXYGEN Inhale 3 L into the lungs at bedtime as needed (shortness of breath).     polyethylene glycol (MIRALAX / GLYCOLAX) 17 g packet Take 17 g by mouth daily. 30 each 2   potassium chloride (KLOR-CON) 20 MEQ packet Take 20 mEq by mouth 2 (two) times daily. 60 each 2   rosuvastatin (CRESTOR) 20 MG tablet Take 1 tablet (20 mg total) by mouth daily. 90 tablet 2   triamcinolone ointment (KENALOG) 0.5 % Apply 1 Application topically 2 (two) times daily. 30 g 0   No facility-administered medications prior to visit.  Allergies:   Gabapentin and Metformin and related   Social History   Socioeconomic History   Marital status: Significant Other    Spouse name: [name]   Number of children: 1   Years of education: Not on file   Highest education level: Not on file  Occupational History   Occupation: Retired    Comment: Aeronautical engineer  Tobacco Use   Smoking status: Every Day    Packs/day: 0.50    Years: 48.00    Total pack years: 24.00    Types: Cigarettes    Start date: 02/12/1966    Last attempt to quit: 07/03/2021    Years since quitting: 0.7   Smokeless tobacco: Never   Tobacco comments:    smokes 5-6 cigarettes per day 06/07/2020  Vaping Use   Vaping Use: Never used  Substance and Sexual Activity   Alcohol use: Not Currently    Alcohol/week: 0.0 standard drinks of alcohol   Drug use: No   Sexual activity: Never  Other Topics Concern   Not on file  Social History Narrative   Staying with his ex-wife, [name]   Has 2 children (today he told me one child 01/15/21)   Social Determinants of Health   Financial Resource Strain: Low Risk  (01/15/2021)   Overall Financial Resource Strain (CARDIA)    Difficulty of Paying Living Expenses: Not very hard  Food Insecurity: No Food Insecurity (01/15/2021)   Hunger Vital Sign    Worried About Running Out of Food in the Last Year: Never true    [place] in the Last  Year: Never true  Transportation Needs: No Transportation Needs (01/15/2021)   PRAPARE - Hydrologist (Medical): No    Lack of Transportation (Non-Medical): No  Physical Activity: Inactive (01/16/2022)   Exercise Vital Sign    Days of Exercise per Week: 0 days    Minutes of Exercise per Session: 0 min  Stress: Stress Concern Present (01/16/2022)   [place]    Feeling of Stress : To some extent  Social Connections: Moderately Isolated (01/15/2021)   Social Connection and Isolation Panel [NHANES]    Frequency of Communication with Friends and Family: Twice a week    Frequency of Social Gatherings with Friends and Family: Once a week    Attends Religious Services: Never    Marine scientist or Organizations: No    Attends Music therapist: Never    Marital Status: Living with partner     Family History:  The patient's ***family history includes CAD in his father; Cancer in his brother and brother; Dementia in his sister; Emphysema in his sister; Leukemia in his sister; Lung cancer in his brother; Stroke in his mother.   Review of Systems:    Please see the history of present illness.     All other systems reviewed and are otherwise negative except as noted above.   Physical Exam:    VS:  There were no vitals taken for this visit.   General: Well developed, well nourished,male appearing in no acute distress. Head: Normocephalic, atraumatic. Neck: No carotid bruits. JVD not elevated.  Lungs: Respirations regular and unlabored, without wheezes or rales.  Heart: ***Regular rate and rhythm. No S3 or S4.  No murmur, no rubs, or gallops appreciated. Abdomen: Appears non-distended. No obvious abdominal masses. Msk:  Strength and tone appear normal for age. No obvious joint deformities or effusions.  Extremities: No clubbing or cyanosis. No edema.  Distal pedal pulses are 2+  bilaterally. Neuro: Alert and oriented X 3. Moves all extremities spontaneously. No focal deficits noted. Psych:  Responds to questions appropriately with a normal affect. Skin: No rashes or lesions noted  Wt Readings from Last 3 Encounters:  03/17/22 174 lb (78.9 kg)  02/28/22 174 lb (78.9 kg)  01/24/22 174 lb (78.9 kg)        Studies/Labs Reviewed:   EKG:  EKG is*** ordered today.  The ekg ordered today demonstrates ***  Recent Labs: 11/23/2021: Magnesium 2.3 01/24/2022: ALT 8 03/17/2022: B Natriuretic Peptide 375.0; BUN 12; Creatinine, Ser 1.55; Hemoglobin 12.4; Platelets 378; Potassium 2.9; Sodium 142   Lipid Panel    Component Value Date/Time   CHOL 156 08/14/2021 1531   TRIG 157 (H) 08/14/2021 1531   HDL 48 08/14/2021 1531   CHOLHDL 3.3 08/14/2021 1531   CHOLHDL 3.8 05/25/2020 0453   VLDL 29 05/25/2020 0453   LDLCALC 81 08/14/2021 1531    Additional studies/ records that were reviewed today include:   LHC: 05/2020 Previously placed Mid LAD stent (unknown type) is widely patent. Mid LAD to Dist LAD lesion is 50% stenosed. Dist LAD lesion is 50% stenosed. Prox RCA lesion is 60% stenosed. Mid RCA lesion is 50% stenosed. RV Branch lesion is 80% stenosed. LV end diastolic pressure is mildly elevated.   1. Modest 2 vessel CAD. The stent in the LAD is widely patent.     50% stenosis in the mid and distal LAD    60% proximal and 50% mid RCA. 80% diffuse disease in the RV marginal branch 2. LVEDP 16 mm Hg   Plan: would recommend medical therapy. No culprit lesion for his chest pain identified.   Echocardiogram: 08/2021 IMPRESSIONS     1. Left ventricular ejection fraction, by estimation, is 55 to 60%. The  left ventricle has normal function. The left ventricle demonstrates  regional wall motion abnormalities (see scoring diagram/findings for  description). There is mild left ventricular   hypertrophy. Left ventricular diastolic parameters are consistent with   Grade I diastolic dysfunction (impaired relaxation). Elevated left  ventricular end-diastolic pressure. The E/e' is 20. There is moderate  hypokinesis of the left ventricular, basal  inferoseptal wall, inferolateral wall and septal wall.   2. Right ventricular systolic function is hyperdynamic. The right  ventricular size is normal.   3. Left atrial size was mildly dilated.   4. The mitral valve is abnormal. Trivial mitral valve regurgitation.   5. The aortic valve is tricuspid. Aortic valve regurgitation is not  visualized.   6. The inferior vena cava is normal in size with greater than 50%  respiratory variability, suggesting right atrial pressure of 3 mmHg.   7. Cannot exclude a small PFO.   Comparison(s): Changes from prior study are noted. 05/07/2021: LVEF  50-55%, no regional wall motion abnormalities.    Assessment:    No diagnosis found.   Plan:   In order of problems listed above:  ***    Shared Decision Making/Informed Consent:   {Are you ordering a CV Procedure (e.g. stress test, cath, DCCV, TEE, etc)?   Press F2        :[id number]}    Medication Adjustments/Labs and Tests Ordered: Current medicines are reviewed at length with the patient today.  Concerns regarding medicines are outlined above.  Medication changes, Labs and Tests ordered today are listed in the Patient Instructions below. There are no Patient  Instructions on file for this visit.   Signed, Erma Heritage, PA-C  03/26/2022 5:04 PM    [facility] [address] [place], [place] [address] Phone: [phone] Fax: [phone]

## 2022-03-27 ENCOUNTER — Ambulatory Visit: Payer: Medicare HMO | Admitting: Student

## 2022-03-30 ENCOUNTER — Other Ambulatory Visit: Payer: Self-pay | Admitting: Family

## 2022-03-30 DIAGNOSIS — I509 Heart failure, unspecified: Secondary | ICD-10-CM

## 2022-04-03 DIAGNOSIS — I5030 Unspecified diastolic (congestive) heart failure: Secondary | ICD-10-CM | POA: Diagnosis not present

## 2022-04-05 DIAGNOSIS — J439 Emphysema, unspecified: Secondary | ICD-10-CM | POA: Diagnosis not present

## 2022-04-09 ENCOUNTER — Ambulatory Visit: Payer: Medicare HMO | Admitting: Internal Medicine

## 2022-04-09 ENCOUNTER — Other Ambulatory Visit: Payer: Self-pay | Admitting: Family

## 2022-04-10 ENCOUNTER — Telehealth: Payer: Self-pay | Admitting: Family

## 2022-04-10 MED ORDER — POTASSIUM CHLORIDE CRYS ER 20 MEQ PO TBCR: 20.0000 meq | EXTENDED_RELEASE_TABLET | Freq: Two times a day (BID) | ORAL | 0 refills | Status: DC

## 2022-04-10 NOTE — Telephone Encounter (Signed)
Can patient try and dissolve tablet in liquid and take it that way.   Or he can try the powder in apple sauce or orange Gatorade.

## 2022-04-10 NOTE — Addendum Note (Signed)
Addended by: Ladean Raya on: 04/10/2022 03:51 PM   Modules accepted: Orders

## 2022-04-10 NOTE — Telephone Encounter (Signed)
Aware they needed tablets sent back in and sent to pharmacy

## 2022-04-10 NOTE — Telephone Encounter (Signed)
Pt has tried cutting pill form in half and still gets choked.  He has tried mixing the powder in other liquids other that water and he " cannot stomach it" per wife.  Wife states that he is on 76m of Lasix and feels that it has been a long time since pt has taken the Potas[place]  Will route to [place] advise..Marland Kitchen

## 2022-04-22 ENCOUNTER — Other Ambulatory Visit: Payer: Self-pay | Admitting: Family

## 2022-04-22 DIAGNOSIS — E1142 Type 2 diabetes mellitus with diabetic polyneuropathy: Secondary | ICD-10-CM

## 2022-04-22 DIAGNOSIS — J9611 Chronic respiratory failure with hypoxia: Secondary | ICD-10-CM

## 2022-04-22 DIAGNOSIS — J441 Chronic obstructive pulmonary disease with (acute) exacerbation: Secondary | ICD-10-CM

## 2022-04-22 DIAGNOSIS — I509 Heart failure, unspecified: Secondary | ICD-10-CM

## 2022-04-23 ENCOUNTER — Telehealth: Payer: Self-pay | Admitting: Family

## 2022-04-23 NOTE — Telephone Encounter (Signed)
Returned call - aware no samples

## 2022-04-23 NOTE — Telephone Encounter (Signed)
Calling to check on Trelegy samples. Please call back and advise.

## 2022-04-27 ENCOUNTER — Other Ambulatory Visit: Payer: Self-pay | Admitting: Family

## 2022-04-27 DIAGNOSIS — I509 Heart failure, unspecified: Secondary | ICD-10-CM

## 2022-05-02 ENCOUNTER — Other Ambulatory Visit: Payer: Self-pay | Admitting: Family

## 2022-05-04 DIAGNOSIS — I5030 Unspecified diastolic (congestive) heart failure: Secondary | ICD-10-CM | POA: Diagnosis not present

## 2022-05-05 ENCOUNTER — Telehealth: Payer: Self-pay

## 2022-05-05 ENCOUNTER — Other Ambulatory Visit: Payer: Self-pay | Admitting: Family

## 2022-05-05 DIAGNOSIS — J9612 Chronic respiratory failure with hypercapnia: Secondary | ICD-10-CM | POA: Diagnosis not present

## 2022-05-05 DIAGNOSIS — I5032 Chronic diastolic (congestive) heart failure: Secondary | ICD-10-CM | POA: Diagnosis not present

## 2022-05-05 DIAGNOSIS — Z515 Encounter for palliative care: Secondary | ICD-10-CM | POA: Diagnosis not present

## 2022-05-05 DIAGNOSIS — J449 Chronic obstructive pulmonary disease, unspecified: Secondary | ICD-10-CM | POA: Diagnosis not present

## 2022-05-05 DIAGNOSIS — J9611 Chronic respiratory failure with hypoxia: Secondary | ICD-10-CM | POA: Diagnosis not present

## 2022-05-05 NOTE — Progress Notes (Signed)
  Chronic Care Management   Note  05/05/2022 Name: IANN RODIER MRN: [id number] DOB: [birth year]  JAHREL BORTHWICK is a 76 y.o. year old male who is a primary care patient of [name], FNP. I reached out to [name] by phone today in response to a referral sent by Mr. [employer] PCP.  Mr. ZYAN COBY  agreedto scheduling an appointment with the CCM RN Case Manager   Follow up plan: Patient agreed to scheduled appointment with RN Case Manager on 05/20/2022(date/time).   Noreene Larsson, [place], [place] [address] Direct Dial: [phone] [email or web address]

## 2022-05-06 DIAGNOSIS — J439 Emphysema, unspecified: Secondary | ICD-10-CM | POA: Diagnosis not present

## 2022-05-12 ENCOUNTER — Other Ambulatory Visit: Payer: Self-pay | Admitting: Family

## 2022-05-14 DIAGNOSIS — Z743 Need for continuous supervision: Secondary | ICD-10-CM | POA: Diagnosis not present

## 2022-05-14 DIAGNOSIS — R062 Wheezing: Secondary | ICD-10-CM | POA: Diagnosis not present

## 2022-05-14 DIAGNOSIS — E785 Hyperlipidemia, unspecified: Secondary | ICD-10-CM | POA: Diagnosis not present

## 2022-05-14 DIAGNOSIS — Z79899 Other long term (current) drug therapy: Secondary | ICD-10-CM | POA: Diagnosis not present

## 2022-05-14 DIAGNOSIS — Z20822 Contact with and (suspected) exposure to covid-19: Secondary | ICD-10-CM | POA: Diagnosis not present

## 2022-05-14 DIAGNOSIS — Z1152 Encounter for screening for COVID-19: Secondary | ICD-10-CM | POA: Diagnosis not present

## 2022-05-14 DIAGNOSIS — R0602 Shortness of breath: Secondary | ICD-10-CM | POA: Diagnosis not present

## 2022-05-14 DIAGNOSIS — R69 Illness, unspecified: Secondary | ICD-10-CM | POA: Diagnosis not present

## 2022-05-14 DIAGNOSIS — I2510 Atherosclerotic heart disease of native coronary artery without angina pectoris: Secondary | ICD-10-CM | POA: Diagnosis not present

## 2022-05-14 DIAGNOSIS — J441 Chronic obstructive pulmonary disease with (acute) exacerbation: Secondary | ICD-10-CM | POA: Diagnosis not present

## 2022-05-14 DIAGNOSIS — I10 Essential (primary) hypertension: Secondary | ICD-10-CM | POA: Diagnosis not present

## 2022-05-14 DIAGNOSIS — E119 Type 2 diabetes mellitus without complications: Secondary | ICD-10-CM | POA: Diagnosis not present

## 2022-05-14 DIAGNOSIS — R9431 Abnormal electrocardiogram [ECG] [EKG]: Secondary | ICD-10-CM | POA: Diagnosis not present

## 2022-05-14 DIAGNOSIS — Z794 Long term (current) use of insulin: Secondary | ICD-10-CM | POA: Diagnosis not present

## 2022-05-14 DIAGNOSIS — Z720 Tobacco use: Secondary | ICD-10-CM | POA: Diagnosis not present

## 2022-05-15 DIAGNOSIS — J441 Chronic obstructive pulmonary disease with (acute) exacerbation: Secondary | ICD-10-CM | POA: Diagnosis not present

## 2022-05-15 DIAGNOSIS — J449 Chronic obstructive pulmonary disease, unspecified: Secondary | ICD-10-CM | POA: Diagnosis not present

## 2022-05-20 ENCOUNTER — Ambulatory Visit (INDEPENDENT_AMBULATORY_CARE_PROVIDER_SITE_OTHER): Payer: Medicare HMO | Admitting: *Deleted

## 2022-05-20 DIAGNOSIS — E1142 Type 2 diabetes mellitus with diabetic polyneuropathy: Secondary | ICD-10-CM

## 2022-05-20 DIAGNOSIS — J9611 Chronic respiratory failure with hypoxia: Secondary | ICD-10-CM

## 2022-05-20 DIAGNOSIS — J439 Emphysema, unspecified: Secondary | ICD-10-CM

## 2022-05-20 NOTE — Patient Instructions (Signed)
Please call the care guide team at [phone] if you need to cancel or reschedule your appointment.   If you are experiencing a Mental Health or [place] or need someone to talk to, please call the Suicide and Crisis Lifeline: 988 call the [place] National Suicide Prevention Lifeline: (367)599-6615 or TTY: 316-719-2092 TTY 779-400-6166) to talk to a trained counselor call 1-800-273-TALK (toll free, 24 hour hotline) go to [facility] [phone]) call the [place]: [phone] call 54   Following is a copy of your full provider care plan:   Goals Addressed             This Visit's Progress    CCM (COPD) EXPECTED OUTCOME: MONITOR, SELF-MANAGE AND REDUCE SYMPTOMS OF COPD       Current Barriers:  Knowledge Deficits related to COPD management Chronic Disease Management support and education needs related to COPD, medications Literacy barriers Non-adherence to prescribed medication regimen No Advanced Directives in place- pt declines Patient reports he uses oxygen at 3 liters at hs as needed, pt reports he is out of albuterol inhaler and did not tell his daughter in law he was out. Patient reports he smokes 1 ppd and not interested in smoking cessation Per daughter in law [name], pt is set in his ways and not willing to change, family has tried and it does no good, not worth the hard feelings it has caused in the past  Planned Interventions: Provided patient with basic written and verbal COPD education on self care/management/and exacerbation prevention Advised patient to track and manage COPD triggers Provided instruction about proper use of medications used for management of COPD including inhalers Advised patient to self assesses COPD action plan zone and make appointment with provider if in the yellow zone for 48 hours without improvement Advised patient to engage in light exercise  as tolerated 3-5 days a week to aid in the the management of COPD Provided education about and advised patient to utilize infection prevention strategies to reduce risk of respiratory infection Discussed the importance of adequate rest and management of fatigue with COPD Reviewed importance of smoking cessation Reviewed with pt he cannot smoke around oxygen  Symptom Management: Take medications as prescribed   Attend all scheduled provider appointments Call pharmacy for medication refills 3-7 days in advance of running out of medications Attend church or other social activities Perform all self care activities independently  Call provider office for new concerns or questions  eliminate smoking in my home identify and remove indoor air pollutants limit outdoor activity during cold weather listen for public air quality announcements every day do breathing exercises every day develop a rescue plan follow rescue plan if symptoms flare-up eat healthy/prescribed diet: heart healthy, low sodium get at least 7 to 8 hours of sleep at night practice relaxation or meditation daily Do not smoke around oxygen Please have albuterol and xanax refilled, take potassium as prescribed Look over education sent via my chart- COPD action plan  Follow Up Plan: Telephone follow up appointment with care management team member scheduled for: 06/24/22 at 1045 am       CCM (DIABETES) EXPECTED OUTCOME:  MONITOR, SELF-MANAGE AND REDUCE SYMPTOMS OF DIABETES       Current Barriers:  Knowledge Deficits related to Diabetes management Chronic Disease Management support and education needs related to Diabetes and diet Literacy barriers Non-adherence to prescribed medication regimen No Advanced Directives in place- pt declines information Spoke  with patient and ex-wife [name] (per pt request), reports independent with ADL's, cannot read or write and ex-wife and daughter in Scientist, physiological (nurse) provide some assistance,  [name] oversees medications and [name] reports pt does not let [name] know when he is out of as needed medications such as albuterol inhaler or xanax.   Patient states he does not have diabetes, has glucometer but does not check CBG and states , [name] reports pt is not willing to do any of this and does not acknowledge that he has diabetes Consulting civil engineer called [name] (daughter in law) permission given by pt, she states she tries to provide oversight with medications but pt will not let her know when he is out of prn medications, pt reports he his out of albuterol and xanax, [name] states she will check into this and request a refill.  [name] reports pt uses liquid potassium and does not like the taste and is not taking, unable to swallow tablets. Pt is unable to use a telephone and cannot call in his own refills and [name] will not be responsible for this.  Planned Interventions: Provided education to patient about basic DM disease process; Reviewed medications with patient and discussed importance of medication adherence;        Reviewed prescribed diet with patient carbohydrate ; Counseled on importance of regular laboratory monitoring as prescribed;        Discussed plans with patient for ongoing care management follow up and provided patient with direct contact information for care management team;      Provided patient with written educational materials related to hypo and hyperglycemia and importance of correct treatment;       Reviewed scheduled/upcoming provider appointments including: 05/26/22 primary care provider;         call provider for findings outside established parameters;       Screening for signs and symptoms of depression related to chronic disease state;        Assessed social determinant of health barriers;        In basket message to primary care provider with update that pt is non adherent with medication regime, not taking potassium, is out of albuterol inhaler and  xanax.  Symptom Management: Take medications as prescribed   Attend all scheduled provider appointments Call pharmacy for medication refills 3-7 days in advance of running out of medications Attend church or other social activities Perform all self care activities independently  Perform IADL's (shopping, preparing meals, housekeeping, managing finances) independently Call provider office for new concerns or questions  check blood sugar at prescribed times: when you have symptoms of low or high blood sugar and per doctor order  check feet daily for cuts, sores or redness enter blood sugar readings and medication or insulin into daily log take the blood sugar log to all doctor visits take the blood sugar meter to all doctor visits fill half of plate with vegetables limit fast food meals to no more than 1 per week manage portion size prepare main meal at home 3 to 5 days each week keep feet up while sitting Look over education sent via my chart- hypoglycemia Please get albuterol inhaler and xanax refilled Please start taking potassium Let your family know when refills are needed  Follow Up Plan: Telephone follow up appointment with care management team member scheduled for: 06/24/22 at 1045 am          Patient verbalizes understanding of instructions and care plan provided today and agrees  to view in [place]. Active MyChart status and patient understanding of how to access instructions and care plan via MyChart confirmed with patient.     Telephone follow up appointment with care management team member scheduled for:  06/24/22 at 71 am  COPD Action Plan A COPD action plan is a description of what to do when you have a flare (exacerbation) of chronic obstructive pulmonary disease (COPD). Your action plan is a color-coded plan that lists the symptoms that indicate whether your condition is under control and what actions to take. If you have symptoms in the green zone, it means you  are doing well that day. If you have symptoms in the yellow zone, it means you are having a bad day or an exacerbation. If you have symptoms in the red zone, you need urgent medical care. Follow the plan that you and your health care provider developed. Review your plan with your health care provider at each visit. Red zone Symptoms in this zone mean that you should get medical help right away. They include: Feeling very short of breath, even when you are resting. Not being able to do any activities because of poor breathing. Not being able to sleep because of poor breathing. Fever or shaking chills. Feeling confused or very sleepy. Chest pain. Coughing up blood. If you have any of these symptoms, call emergency services (911 in the [place] or go to the nearest emergency room. Yellow zone Symptoms in this zone mean that your condition may be getting worse. They include: Feeling more short of breath than usual. Having less energy for daily activities than usual. Phlegm or mucus that is thicker than usual. Needing to use your rescue inhaler or nebulizer more often than usual. More ankle swelling than usual. Coughing more than usual. Feeling like you have a chest cold. Trouble sleeping due to COPD symptoms. Decreased appetite. COPD medicines not helping as much as usual. If you experience any "yellow" symptoms: Keep taking your daily medicines as directed. Use your quick-relief inhaler as told by your health care provider. If you were prescribed steroid medicine to take by mouth (oral medicine), start taking it as told by your health care provider. If you were prescribed an antibiotic medicine, start taking it as told by your health care provider. Do not stop taking the antibiotic even if you start to feel better. Use oxygen as told by your health care provider. Get more rest. Do your pursed-lip breathing exercises. Do not smoke. Avoid any irritants in the air. If your signs and  symptoms do not improve after taking these steps, call your health care provider right away. Green zone Symptoms in this zone mean that you are doing well. They include: Being able to do your usual activities and exercise. Having the usual amount of coughing, including the same amount of phlegm or mucus. Being able to sleep well. Having a good appetite. Where to find more information: You can find more information about COPD from: American Lung Association, My COPD Action Plan: www.lung.org COPD Foundation: www.copdfoundation.[place]: https://wilson-eaton.com/ Follow these instructions at home: Continue taking your daily medicines as told by your health care provider. Make sure you receive all the immunizations that your health care provider recommends, especially the pneumococcal and influenza vaccines. Wash your hands often with soap and water. Have family members wash their hands too. Regular hand washing can help prevent infections. Follow your usual exercise and diet plan. Avoid irritants in the air, such  as smoke. Do not use any products that contain nicotine or tobacco. These products include cigarettes, chewing tobacco, and vaping devices, such as e-cigarettes. If you need help quitting, ask your health care provider. Summary A COPD action plan tells you what to do when you have a flare (exacerbation) of chronic obstructive pulmonary disease (COPD). Follow each action plan for your symptoms. If you have any symptoms in the red zone, call emergency services (911 in the [place] or go to the nearest emergency room. This information is not intended to replace advice given to you by your health care provider. Make sure you discuss any questions you have with your health care provider. Document Revised: 04/03/2020 Document Reviewed: 04/03/2020 Elsevier Patient Education  [place]. Hypoglycemia Hypoglycemia occurs when the level of sugar (glucose) in the  blood is too low. Hypoglycemia can happen in people who have or do not have diabetes. It can develop quickly, and it can be a medical emergency. For most people, a blood glucose level below 70 mg/dL (3.9 mmol/L) is considered hypoglycemia. Glucose is a type of sugar that provides the body's main source of energy. Certain hormones (insulin and glucagon) control the level of glucose in the blood. Insulin lowers blood glucose, and glucagon raises blood glucose. Hypoglycemia can result from having too much insulin in the bloodstream, or from not eating enough food that contains glucose. You may also have reactive hypoglycemia, which happens within 4 hours after eating a meal. What are the causes? Hypoglycemia occurs most often in people who have diabetes and may be caused by: Diabetes medicine. Not eating enough, or not eating often enough. Increased physical activity. Drinking alcohol on an empty stomach. If you do not have diabetes, hypoglycemia may be caused by: A tumor in the pancreas. Not eating enough, or not eating for long periods at a time (fasting). A severe infection or illness. Problems after having bariatric surgery. Organ failure, such as kidney or liver failure. Certain medicines. What increases the risk? Hypoglycemia is more likely to develop in people who: Have diabetes and take medicines to lower blood glucose. Abuse alcohol. Have a severe illness. What are the signs or symptoms? Symptoms vary depending on whether the condition is mild, moderate, or severe. Mild hypoglycemia Hunger. Sweating and feeling clammy. Dizziness or feeling light-headed. Sleepiness or restless sleep. Nausea. Increased heart rate. Headache. Blurry vision. Mood changes, such as irritability or anxiety. Tingling or numbness around the mouth, lips, or tongue. Moderate hypoglycemia Confusion and poor judgment. Behavior changes. Weakness. Irregular heartbeat. A change in coordination. Severe  hypoglycemia Severe hypoglycemia is a medical emergency. It can cause: Fainting. Seizures. Loss of consciousness (coma). Death. How is this diagnosed? Hypoglycemia is diagnosed with a blood test to measure your blood glucose level. This blood test is done while you are having symptoms. Your health care provider may also do a physical exam and review your medical history. How is this treated? This condition can be treated by immediately eating or drinking something that contains sugar with 15 grams of fast-acting carbohydrate, such as: 4 oz (120 mL) of fruit juice. 4 oz (120 mL) of regular soda (not diet soda). Several pieces of hard candy. Check food labels to find out how many pieces to eat for 15 grams. 1 Tbsp (15 mL) of sugar or honey. 4 glucose tablets. 1 tube of glucose gel. Treating hypoglycemia if you have diabetes If you are alert and able to swallow safely, follow the 15:15 rule: Take 15 grams of  a fast-acting carbohydrate. Talk with your health care provider about how much you should take. Options for getting 15 grams of fast-acting carbohydrate include: Glucose tablets (take 4 tablets). Several pieces of hard candy. Check food labels to find out how many pieces to eat for 15 grams. 4 oz (120 mL) of fruit juice. 4 oz (120 mL) of regular soda (not diet soda). 1 Tbsp (15 mL) of sugar or honey. 1 tube of glucose gel. Check your blood glucose 15 minutes after you take the carbohydrate. If the repeat blood glucose level is still at or below 70 mg/dL (3.9 mmol/L), take 15 grams of a carbohydrate again. If your blood glucose level does not increase above 70 mg/dL (3.9 mmol/L) after 3 tries, seek emergency medical care. After your blood glucose level returns to normal, eat a meal or a snack within 1 hour.  Treating severe hypoglycemia Severe hypoglycemia is when your blood glucose level is below 54 mg/dL (3 mmol/L). Severe hypoglycemia is a medical emergency. Get medical help right  away. If you have severe hypoglycemia and you cannot eat or drink, you will need to be given glucagon. A family member or close friend should learn how to check your blood glucose and how to give you glucagon. Ask your health care provider if you need to have an emergency glucagon kit available. Severe hypoglycemia may need to be treated in a hospital. The treatment may include getting glucose through an IV. You may also need treatment for the cause of your hypoglycemia. Follow these instructions at home:  General instructions Take over-the-counter and prescription medicines only as told by your health care provider. Monitor your blood glucose as told by your health care provider. If you drink alcohol: Limit how much you have to: 0-1 drink a day for women who are not pregnant. 0-2 drinks a day for men. Know how much alcohol is in your drink. In the [place] one drink equals one 12 oz bottle of beer (355 mL), one 5 oz glass of wine (148 mL), or one 1 oz glass of hard liquor (44 mL). Be sure to eat food along with drinking alcohol. Be aware that alcohol is absorbed quickly and may have lingering effects that may result in hypoglycemia later. Be sure to do ongoing glucose monitoring. Keep all follow-up visits. This is important. If you have diabetes: Always have a fast-acting carbohydrate (15 grams) option with you to treat low blood glucose. Follow your diabetes management plan as directed by your health care provider. Make sure you: Know the symptoms of hypoglycemia. It is important to treat it right away to prevent it from becoming severe. Check your blood glucose as often as told. Always check before and after exercise. Always check your blood glucose before you drive a motorized vehicle. Take your medicines as told. Follow your meal plan. Eat on time, and do not skip meals. Share your diabetes management plan with people in your workplace, school, and household. Carry a medical alert card or  wear medical alert jewelry. Where to find more information American Diabetes Association: www.diabetes.org Contact a health care provider if: You have problems keeping your blood glucose in your target range. You have frequent episodes of hypoglycemia. Get help right away if: You continue to have hypoglycemia symptoms after eating or drinking something that contains 15 grams of fast-acting carbohydrate, and you cannot get your blood glucose above 70 mg/dL (3.9 mmol/L) while following the 15:15 rule. Your blood glucose is below 54 mg/dL (3 mmol/L).  You have a seizure. You faint. These symptoms may represent a serious problem that is an emergency. Do not wait to see if the symptoms will go away. Get medical help right away. Call your local emergency services (911 in the [place] Do not drive yourself to the hospital. Summary Hypoglycemia occurs when the level of sugar (glucose) in the blood is too low. Hypoglycemia can happen in people who have or do not have diabetes. It can develop quickly, and it can be a medical emergency. Make sure you know the symptoms of hypoglycemia and how to treat it. Always have a fast-acting carbohydrate option with you to treat low blood sugar. This information is not intended to replace advice given to you by your health care provider. Make sure you discuss any questions you have with your health care provider. Document Revised: 04/26/2020 Document Reviewed: 04/26/2020 Elsevier Patient Education  [place].

## 2022-05-20 NOTE — Plan of Care (Signed)
Chronic Care Management Provider Comprehensive Care Plan    05/20/2022 Name: Ronald [name] MRN: [id number] DOB: [birth year]  Referral to Chronic Care Management (CCM) services was placed by Provider:  [name] on Date: 04/22/22.  Chronic Condition 1: DIABETES Provider Assessment and Plan Diabetes mellitus type 2 in obese (HCC) - Bayer DCA Hb A1c Waived - CMP14+EGFR - CBC with Differential/Platelet - Microalbumin / creatinine urine ratio   Expected Outcome/Goals Addressed This Visit (Provider CCM goals/Provider Assessment and plan  CCM (DIABETES) EXPECTED OUTCOME:  MONITOR, SELF-MANAGE AND REDUCE SYMPTOMS OF DIABETES  Symptom Management Condition 1: Take medications as prescribed   Attend all scheduled provider appointments Call pharmacy for medication refills 3-7 days in advance of running out of medications Attend church or other social activities Perform all self care activities independently  Perform IADL's (shopping, preparing meals, housekeeping, managing finances) independently Call provider office for new concerns or questions  check blood sugar at prescribed times: when you have symptoms of low or high blood sugar and per doctor order  check feet daily for cuts, sores or redness enter blood sugar readings and medication or insulin into daily log take the blood sugar log to all doctor visits take the blood sugar meter to all doctor visits fill half of plate with vegetables limit fast food meals to no more than 1 per week manage portion size prepare main meal at home 3 to 5 days each week keep feet up while sitting Look over education sent via my chart- hypoglycemia Please get albuterol inhaler and xanax refilled Please start taking potassium Let your family know when refills are needed  Chronic Condition 2: COPD Provider Assessment and Plan  Pulmonary emphysema, unspecified emphysema type ([place]) - CMP14+EGFR - CBC with Differential/Platelet  Chronic respiratory  failure with hypoxia ([place]) - CMP14+EGFR - CBC with Differential/Platelet   Expected Outcome/Goals Addressed This Visit (Provider CCM goals/Provider Assessment and plan  CCM (COPD) EXPECTED OUTCOME: MONITOR, SELF-MANAGE AND REDUCE SYMPTOMS OF COPD  Symptom Management Condition 2: Take medications as prescribed   Attend all scheduled provider appointments Call pharmacy for medication refills 3-7 days in advance of running out of medications Attend church or other social activities Perform all self care activities independently  Call provider office for new concerns or questions  eliminate smoking in my home identify and remove indoor air pollutants limit outdoor activity during cold weather listen for public air quality announcements every day do breathing exercises every day develop a rescue plan follow rescue plan if symptoms flare-up eat healthy/prescribed diet: heart healthy, low sodium get at least 7 to 8 hours of sleep at night practice relaxation or meditation daily Do not smoke around oxygen Please have albuterol and xanax refilled, take potassium as prescribed Look over education sent via my chart- COPD action plan  Problem List Patient Active Problem List   Diagnosis Date Noted   COPD with acute exacerbation ([place]) 12/07/2021   Prolonged QT interval 11/23/2021   Acute exacerbation of CHF (congestive heart failure) ([place]) 11/23/2021   Acute on chronic diastolic CHF (congestive heart failure) ([place]) 11/22/2021   Non-ST elevation (NSTEMI) myocardial infarction ([place]) 09/01/2021   Acute respiratory failure ([place]) 07/10/2021   Pulmonary nodule 07/10/2021   Malnutrition of moderate degree 05/13/2021   Atrial flutter ([place]) 05/01/2021   Shock circulatory ([place]) 05/01/2021   Acute hypercapnic respiratory failure ([place]) 05/01/2021   Elevated brain natriuretic peptide (BNP) level 12/21/2020   Elevated troponin 12/21/2020   Hypokalemia 12/21/2020   Mixed hyperlipidemia  12/21/2020    Acute on chronic respiratory failure with hypoxia ([place]) 12/21/2020   Chest pain 05/23/2020   Controlled substance agreement signed 04/03/2020   Benzodiazepine dependence ([place]) 04/03/2020   Dysphagia 02/27/2020   Tracheal stenosis 12/09/2019   Lymphedema 04/01/2019   CHF (congestive heart failure) ([place]) 08/27/2018   Aortic atherosclerosis ([place]) 05/11/2017   Depression 05/05/2017   GERD (gastroesophageal reflux disease) 01/16/2017   Diabetic neuropathy ([place]) 01/16/2017   Anterolisthesis 12/04/2016   History of MI (myocardial infarction) 12/04/2016   OSA (obstructive sleep apnea) 12/04/2016   Closed fracture dislocation of lumbar spine ([place]) 07/25/2016   Closed fracture of body of sternum 07/25/2016   Multiple fractures of cervical spine ([place]) 07/25/2016   Anxiety 02/14/2016   Chronic respiratory failure with hypoxia ([place]) 07/19/2014   Essential hypertension 07/19/2014   Cigarette smoker 07/19/2014   CAD in native artery 06/22/2014   Diabetes mellitus ([place]) 06/22/2014   COPD exacerbation ([place]) 06/22/2014   Tobacco abuse 06/22/2014   Acute encephalopathy 06/22/2014   COPD (chronic obstructive pulmonary disease) with emphysema ([place]) 06/22/2014   Diabetes mellitus type 2 in obese ([place]) 05/11/2014    Medication Management  Current Outpatient Medications:    acetaminophen (TYLENOL) 325 MG tablet, Take 2 tablets (650 mg total) by mouth every 6 (six) hours as needed for mild pain (or Fever >/= 101)., Disp: 12 tablet, Rfl: 0   amiodarone (PACERONE) 200 MG tablet, Take 1 tablet by mouth once daily, Disp: 90 tablet, Rfl: 0   aspirin EC 81 MG EC tablet, Take 1 tablet (81 mg total) by mouth daily. Swallow whole., Disp: 30 tablet, Rfl: 11   bisoprolol (ZEBETA) 5 MG tablet, Take 1 tablet (5 mg total) by mouth daily. In Place of Metoprolol, Disp: 90 tablet, Rfl: 3   budesonide (PULMICORT) 0.5 MG/2ML nebulizer solution, Take 2 mLs (0.5 mg total) by nebulization 2 (two) times daily., Disp: 100 mL,  Rfl: 12   ELIQUIS 5 MG TABS tablet, Take 1 tablet by mouth twice daily, Disp: 60 tablet, Rfl: 2   empagliflozin (JARDIANCE) 10 MG TABS tablet, Take 1 tablet (10 mg total) by mouth daily before breakfast., Disp: 90 tablet, Rfl: 1   furosemide (LASIX) 40 MG tablet, Take 1 tablet (40 mg total) by mouth 2 (two) times daily. (NEEDS TO BE SEEN BEFORE NEXT REFILL), Disp: 60 tablet, Rfl: 0   ipratropium-albuterol (DUONEB) 0.5-2.5 (3) MG/3ML SOLN, Take 3 mLs by nebulization every 6 (six) hours as needed (asthma)., Disp: 360 mL, Rfl: 3   isosorbide mononitrate (ISMO) 10 MG tablet, Take 1 tablet (10 mg total) by mouth 2 (two) times daily., Disp: 180 tablet, Rfl: 0   losartan (COZAAR) 100 MG tablet, Take 1 tablet (100 mg total) by mouth daily., Disp: 90 tablet, Rfl: 1   nitroGLYCERIN (NITROSTAT) 0.4 MG SL tablet, Place 1 tablet (0.4 mg total) under the tongue every 5 (five) minutes as needed for chest pain., Disp: 20 tablet, Rfl: 1   omeprazole (PRILOSEC) 20 MG capsule, Take 1 capsule (20 mg total) by mouth daily., Disp: 90 capsule, Rfl: 3   OXYGEN, Inhale 3 L into the lungs at bedtime as needed (shortness of breath)., Disp: , Rfl:    polyethylene glycol (MIRALAX / GLYCOLAX) 17 g packet, Take 17 g by mouth daily., Disp: 30 each, Rfl: 2   rosuvastatin (CRESTOR) 20 MG tablet, Take 1 tablet (20 mg total) by mouth daily., Disp: 90 tablet, Rfl: 2   triamcinolone ointment (KENALOG) 0.5 %, Apply 1  Application topically 2 (two) times daily., Disp: 30 g, Rfl: 0   albuterol (VENTOLIN HFA) 108 (90 Base) MCG/ACT inhaler, Inhale 2 puffs into the lungs every 6 (six) hours as needed for wheezing or shortness of breath. (Patient not taking: Reported on 05/20/2022), Disp: 18 g, Rfl: 2   ALPRAZolam (XANAX) 0.25 MG tablet, Take 1 tablet (0.25 mg total) by mouth 2 (two) times daily as needed for anxiety. (Patient not taking: Reported on 05/20/2022), Disp: 60 tablet, Rfl: 2   dextromethorphan-guaiFENesin (MUCINEX DM) 30-600 MG 12hr  tablet, Take 1 tablet by mouth 2 (two) times daily. (Patient not taking: Reported on 05/20/2022), Disp: 20 tablet, Rfl: 0   glucose blood (ONE TOUCH ULTRA TEST) test strip, USE TO CHECK GLUCOSE ONCE DAILY (Patient not taking: Reported on 05/20/2022), Disp: 100 each, Rfl: 3   KLOR-CON 20 MEQ packet, DISSOLVE 1 PACKET IN WATER & DRINK TWICE DAILY (Patient not taking: Reported on 05/20/2022), Disp: 60 each, Rfl: 0   potassium chloride SA (KLOR-CON M) 20 MEQ tablet, Take 1 tablet (20 mEq total) by mouth 2 (two) times daily. (Patient not taking: Reported on 05/20/2022), Disp: 60 tablet, Rfl: 0  Cognitive Assessment Identity Confirmed: : Name; DOB Cognitive Status: Normal   Functional Assessment Hearing Difficulty or Deaf: no Wear Glasses or Blind: yes Vision Management: reading glasses Concentrating, Remembering or Making Decisions Difficulty (CP): no Difficulty Communicating: no Difficulty Eating/Swallowing: no Walking or Climbing Stairs Difficulty: no Dressing/Bathing Difficulty: no Doing Errands Independently Difficulty (such as shopping) (CP): no   Caregiver Assessment  Primary Source of Support/Comfort: -- (ex - wife) Name of Support/Comfort Primary Source: Ex-wife and pt live together People in Home: -- (ex wife) Family Caregiver if Needed: none   Planned Interventions  Provided education to patient about basic DM disease process; Reviewed medications with patient and discussed importance of medication adherence;        Reviewed prescribed diet with patient carbohydrate ; Counseled on importance of regular laboratory monitoring as prescribed;        Discussed plans with patient for ongoing care management follow up and provided patient with direct contact information for care management team;      Provided patient with written educational materials related to hypo and hyperglycemia and importance of correct treatment;       Reviewed scheduled/upcoming provider appointments  including: 05/26/22 primary care provider;         call provider for findings outside established parameters;       Screening for signs and symptoms of depression related to chronic disease state;        Assessed social determinant of health barriers;        In basket message to primary care provider with update that pt is non adherent with medication regime, not taking potassium, is out of albuterol inhaler and xanax. Provided patient with basic written and verbal COPD education on self care/management/and exacerbation prevention Advised patient to track and manage COPD triggers Provided instruction about proper use of medications used for management of COPD including inhalers Advised patient to self assesses COPD action plan zone and make appointment with provider if in the yellow zone for 48 hours without improvement Advised patient to engage in light exercise as tolerated 3-5 days a week to aid in the the management of COPD Provided education about and advised patient to utilize infection prevention strategies to reduce risk of respiratory infection Discussed the importance of adequate rest and management of fatigue with COPD Reviewed importance of  smoking cessation Reviewed with pt he cannot smoke around oxygen  Interaction and coordination with outside resources, practitioners, and providers See CCM Referral  Care Plan: Available in MyChart

## 2022-05-20 NOTE — Chronic Care Management (AMB) (Signed)
Chronic Care Management   CCM RN Visit Note  05/20/2022 Name: Ronald [name] MRN: [id number] DOB: 1945-06-18  Subjective: Ronald [name] is a 76 y.o. year old male who is a primary care patient of [name], FNP. The patient was referred to the Chronic Care Management team for assistance with care management needs subsequent to provider initiation of CCM services and plan of care.    Today's Visit:  Engaged with patient by telephone for initial visit.     SDOH Interventions Today    Flowsheet Row Most Recent Value  SDOH Interventions   Food Insecurity Interventions Intervention Not Indicated  Housing Interventions Intervention Not Indicated  Transportation Interventions Intervention Not Indicated  Utilities Interventions Intervention Not Indicated  Financial Strain Interventions Intervention Not Indicated  Physical Activity Interventions Patient Refused  Stress Interventions Intervention Not Indicated         Goals Addressed             This Visit's Progress    CCM (COPD) EXPECTED OUTCOME: MONITOR, SELF-MANAGE AND REDUCE SYMPTOMS OF COPD       Current Barriers:  Knowledge Deficits related to COPD management Chronic Disease Management support and education needs related to COPD, medications Literacy barriers Non-adherence to prescribed medication regimen No Advanced Directives in place- pt declines Patient reports he uses oxygen at 3 liters at hs as needed, pt reports he is out of albuterol inhaler and did not tell his daughter in law he was out. Patient reports he smokes 1 ppd and not interested in smoking cessation Per daughter in law Ronald [name], pt is set in his ways and not willing to change, family has tried and it does no good, not worth the hard feelings it has caused in the past  Planned Interventions: Provided patient with basic written and verbal COPD education on self care/management/and exacerbation prevention Advised patient to track and manage COPD  triggers Provided instruction about proper use of medications used for management of COPD including inhalers Advised patient to self assesses COPD action plan zone and make appointment with provider if in the yellow zone for 48 hours without improvement Advised patient to engage in light exercise as tolerated 3-5 days a week to aid in the the management of COPD Provided education about and advised patient to utilize infection prevention strategies to reduce risk of respiratory infection Discussed the importance of adequate rest and management of fatigue with COPD Reviewed importance of smoking cessation Reviewed with pt he cannot smoke around oxygen  Symptom Management: Take medications as prescribed   Attend all scheduled provider appointments Call pharmacy for medication refills 3-7 days in advance of running out of medications Attend church or other social activities Perform all self care activities independently  Call provider office for new concerns or questions  eliminate smoking in my home identify and remove indoor air pollutants limit outdoor activity during cold weather listen for public air quality announcements every day do breathing exercises every day develop a rescue plan follow rescue plan if symptoms flare-up eat healthy/prescribed diet: heart healthy, low sodium get at least 7 to 8 hours of sleep at night practice relaxation or meditation daily Do not smoke around oxygen Please have albuterol and xanax refilled, take potassium as prescribed Look over education sent via my chart- COPD action plan  Follow Up Plan: Telephone follow up appointment with care management team member scheduled for: 06/24/22 at 1045 am       CCM (DIABETES) EXPECTED OUTCOME:  MONITOR, SELF-MANAGE  AND REDUCE SYMPTOMS OF DIABETES       Current Barriers:  Knowledge Deficits related to Diabetes management Chronic Disease Management support and education needs related to Diabetes and  diet Literacy barriers Non-adherence to prescribed medication regimen No Advanced Directives in place- pt declines information Spoke with patient and ex-wife Ronald [name] (per pt request), reports independent with ADL's, cannot read or write and ex-wife and daughter in Scientist, physiological (nurse) provide some assistance, Ronald [name] oversees medications and Ronald [name] reports pt does not let Ronald [name] know when he is out of as needed medications such as albuterol inhaler or xanax.   Patient states he does not have diabetes, has glucometer but does not check CBG and states , Ronald [name] reports pt is not willing to do any of this and does not acknowledge that he has diabetes Consulting civil engineer called Ronald [name] (daughter in law) permission given by pt, she states she tries to provide oversight with medications but pt will not let her know when he is out of prn medications, pt reports he his out of albuterol and xanax, Ronald [name] states she will check into this and request a refill.  Ronald [name] reports pt uses liquid potassium and does not like the taste and is not taking, unable to swallow tablets. Pt is unable to use a telephone and cannot call in his own refills and Ronald [name] will not be responsible for this.  Planned Interventions: Provided education to patient about basic DM disease process; Reviewed medications with patient and discussed importance of medication adherence;        Reviewed prescribed diet with patient carbohydrate ; Counseled on importance of regular laboratory monitoring as prescribed;        Discussed plans with patient for ongoing care management follow up and provided patient with direct contact information for care management team;      Provided patient with written educational materials related to hypo and hyperglycemia and importance of correct treatment;       Reviewed scheduled/upcoming provider appointments including: 05/26/22 primary care provider;         call provider for findings outside established parameters;        Screening for signs and symptoms of depression related to chronic disease state;        Assessed social determinant of health barriers;        In basket message to primary care provider with update that pt is non adherent with medication regime, not taking potassium, is out of albuterol inhaler and xanax.  Symptom Management: Take medications as prescribed   Attend all scheduled provider appointments Call pharmacy for medication refills 3-7 days in advance of running out of medications Attend church or other social activities Perform all self care activities independently  Perform IADL's (shopping, preparing meals, housekeeping, managing finances) independently Call provider office for new concerns or questions  check blood sugar at prescribed times: when you have symptoms of low or high blood sugar and per doctor order  check feet daily for cuts, sores or redness enter blood sugar readings and medication or insulin into daily log take the blood sugar log to all doctor visits take the blood sugar meter to all doctor visits fill half of plate with vegetables limit fast food meals to no more than 1 per week manage portion size prepare main meal at home 3 to 5 days each week keep feet up while sitting Look over education sent via my chart- hypoglycemia Please get albuterol inhaler and xanax refilled Please start taking potassium  Let your family know when refills are needed  Follow Up Plan: Telephone follow up appointment with care management team member scheduled for: 06/24/22 at 1045 am          Plan:Telephone follow up appointment with care management team member scheduled for:  06/24/22 at 1045 am  Jacqlyn Larsen [facility], BSN RN Case Manager [place] [phone]

## 2022-05-21 NOTE — Progress Notes (Unsigned)
Cardiology Office Note:    Date:  05/22/2022   ID:  Ronald [name], DOB 03/14/1946, MRN [id number]  PCP:  [name], [facility] Cardiologist:  Carlyle Dolly, MD     Referring MD: [name], FNP   CC: Here for follow-up and recent ED visit  History of Present Illness:    Ronald [name] is a 76 y.o. male with a hx of the following:  CAD, s/p DES to LAD x 2, s/p NSTEMI in 2015 COPD HTN Hx of CVA/TIA T2DM Tobacco abuse UC/GERD PAF- on Eliquis  Patient is a 76 year old male with past medical history as mentioned above.  In July 2022 he was seen for sputum production and shortness of breath, PCR was positive for COVID-19 and was admitted to the hospital and treated with IV steroids and remdesivir.   Last seen by Levell July, NP on February 07, 2021 for 56-monthfollow-up visit.  Continued to note chronic dyspnea.  BP elevated at 180/88.  Denied any chest pain or acute cardiac complaints.  Stated that he had not seen pulmonology.  Reported using oxygen at night.  Referred to pulmonology.  Was told to follow-up in 6 months.  [facility]22 with shortness of breath.  BNP minimally elevated at 375.  Troponin elevated at 74.  Potassium 2.9.  EKG revealed sinus rhythm.  Symptoms improved with nebulizer treatments.  Troponins were flat.  It was recommended he be admitted to the hospital for observation, patient refused.  His fluid pill was increased as well as his potassium and was told to follow-up with his cardiologist.  [facility] breath as well as cough.  O2 saturation was 84% on room air when EMS arrived.  Denied any flulike symptoms, denied sick contacts.  Chest x-ray did not show anything acute.  Initial troponin 149, second troponin 143.  Patient reported wearing 3 L of O2 at night but denied wearing oxygen during the day.  It was  recommended that he be admitted for close observation, but patient refused.  Patient stated he was feeling better and stated he would use his oxygen at home as needed.  Today he presents for follow-up evaluation. He is a difficult historian and presents today with his ex-wife. Denies any acute exacerbations to his COPD. Breathing tends to be stable, does wear O2 at night. Has not seen Dr. WMelvyn Novasin a long time. Does admit to occasional left sided chest pain, described as sharp, non-radiating and NG helps, intermittent in nature. Nothing makes it worse. Has been chronic and ongoing since heart attack in 2015. Still smoking, says he is trying to quit. Denies any palpitaitons, syncope, presyncope, dizziness, orthopnea, PND, lower extremity swelling, acute bleeding, or claudication. Denies any other questions or concerns.   Past Medical History:  Diagnosis Date   Anxiety    Asthma    Atrial flutter ([place] 04/2021   Chronic diastolic CHF (congestive heart failure) (HCC)    Chronic lower back pain    COPD (chronic obstructive pulmonary disease) (HCC)    Coronary artery disease    a. [facility]   Depression    GERD (gastroesophageal reflux disease)    High cholesterol    History of tracheostomy    Hypertension    NSTEMI (non-ST elevated myocardial infarction) ([place] 05/2014  with stent placement   PAF (paroxysmal atrial fibrillation) ([place])    Sleep apnea    Stroke ([place]) 2017   anyeusym    TIA (transient ischemic attack)    "they say I've had some mini strokes; don't know when"; denies residual on 06/22/2014)   Tobacco abuse    Type II diabetes mellitus ([place])    Ulcerative colitis ([place])     Past Surgical History:  Procedure Laterality Date   APPENDECTOMY     BIOPSY  07/20/2020   Procedure: BIOPSY;  Surgeon: Harvel Quale, MD;  [facility];  Service: Gastroenterology;;   CARDIAC CATHETERIZATION  [phone] X 3   CHOLECYSTECTOMY      COLONOSCOPY WITH PROPOFOL N/A 07/20/2020   Procedure: COLONOSCOPY WITH PROPOFOL;  Surgeon: Harvel Quale, MD;  [facility];  Service: Gastroenterology;  Laterality: N/A;  1:15   CORONARY ANGIOPLASTY WITH STENT PLACEMENT  05/2014   "2"   ESOPHAGEAL DILATION N/A 07/20/2020   Procedure: ESOPHAGEAL DILATION;  Surgeon: Harvel Quale, MD;  [facility];  Service: Gastroenterology;  Laterality: N/A;   ESOPHAGOGASTRODUODENOSCOPY (EGD) WITH PROPOFOL N/A 07/20/2020   Procedure: ESOPHAGOGASTRODUODENOSCOPY (EGD) WITH PROPOFOL;  Surgeon: Harvel Quale, MD;  [facility];  Service: Gastroenterology;  Laterality: N/A;   IR GASTROSTOMY TUBE MOD SED  06/04/2021   IR GASTROSTOMY TUBE REMOVAL  07/24/2021   LEFT HEART CATH AND CORONARY ANGIOGRAPHY N/A 05/25/2020   Procedure: LEFT HEART CATH AND CORONARY ANGIOGRAPHY;  Surgeon: Martinique, Peter M, MD;  Location: [place] [facility];  Service: Cardiovascular;  Laterality: N/A;   POLYPECTOMY  07/20/2020   Procedure: POLYPECTOMY INTESTINAL;  Surgeon: Montez Morita, Quillian Quince, MD;  [facility];  Service: Gastroenterology;;   TUMOR EXCISION Right ~ 1999   "side of my upper head"    Current Medications: Current Meds  Medication Sig   acetaminophen (TYLENOL) 325 MG tablet Take 2 tablets (650 mg total) by mouth every 6 (six) hours as needed for mild pain (or Fever >/= 101).   albuterol (VENTOLIN HFA) 108 (90 Base) MCG/ACT inhaler Inhale 2 puffs into the lungs every 6 (six) hours as needed for wheezing or shortness of breath.   ALPRAZolam (XANAX) 0.25 MG tablet Take 1 tablet (0.25 mg total) by mouth 2 (two) times daily as needed for anxiety.   amiodarone (PACERONE) 200 MG tablet Take 1 tablet by mouth once daily   bisoprolol (ZEBETA) 5 MG tablet Take 1 tablet (5 mg total) by mouth daily. In Place of Metoprolol   budesonide (PULMICORT) 0.5 MG/2ML nebulizer solution Take 2 mLs (0.5 mg total) by  nebulization 2 (two) times daily.   dextromethorphan-guaiFENesin (MUCINEX DM) 30-600 MG 12hr tablet Take 1 tablet by mouth as needed for cough.   ELIQUIS 5 MG TABS tablet Take 1 tablet by mouth twice daily   empagliflozin (JARDIANCE) 10 MG TABS tablet Take 1 tablet (10 mg total) by mouth daily before breakfast.   furosemide (LASIX) 40 MG tablet Take 1 tablet (40 mg total) by mouth 2 (two) times daily. (NEEDS TO BE SEEN BEFORE NEXT REFILL)   glucose blood (ONE TOUCH ULTRA TEST) test strip USE TO CHECK GLUCOSE ONCE DAILY   ipratropium-albuterol (DUONEB) 0.5-2.5 (3) MG/3ML SOLN Take 3 mLs by nebulization every 6 (six) hours as needed (asthma).   losartan (COZAAR) 100 MG tablet Take 1 tablet (100 mg total) by mouth daily.   omeprazole (PRILOSEC) 20 MG capsule Take 1 capsule (20 mg  total) by mouth daily.   OXYGEN Inhale 3 L into the lungs at bedtime as needed (shortness of breath).   polyethylene glycol (MIRALAX / GLYCOLAX) 17 g packet Take 17 g by mouth daily.   potassium chloride SA (KLOR-CON M) 20 MEQ tablet Take 1 tablet (20 mEq total) by mouth 2 (two) times daily.   rosuvastatin (CRESTOR) 20 MG tablet Take 1 tablet (20 mg total) by mouth daily.   triamcinolone ointment (KENALOG) 0.5 % Apply 1 Application topically 2 (two) times daily.   [DISCONTINUED] aspirin EC 81 MG EC tablet Take 1 tablet (81 mg total) by mouth daily. Swallow whole.    isosorbide mononitrate (ISMO) 10 MG tablet Take 1 tablet (10 mg total) by mouth 2 (two) times daily.   nitroGLYCERIN (NITROSTAT) 0.4 MG SL tablet Place 1 tablet (0.4 mg total) under the tongue every 5 (five) minutes as needed for chest pain.     Allergies:   Gabapentin and Metformin and related   Social History   Socioeconomic History   Marital status: Significant Other    Spouse name: [name]   Number of children: 1   Years of education: Not on file   Highest education level: Not on file  Occupational History   Occupation: Retired    Comment:  Aeronautical engineer  Tobacco Use   Smoking status: Every Day    Packs/day: 0.50    Years: 48.00    Total pack years: 24.00    Types: Cigarettes    Start date: 02/12/1966    Last attempt to quit: 07/03/2021    Years since quitting: 0.8   Smokeless tobacco: Never   Tobacco comments:    smokes 1 ppd  05/20/22  Vaping Use   Vaping Use: Never used  Substance and Sexual Activity   Alcohol use: Not Currently    Alcohol/week: 0.0 standard drinks of alcohol   Drug use: No   Sexual activity: Never  Other Topics Concern   Not on file  Social History Narrative   Staying with his ex-wife, [name]   Has 2 children (today he told me one child 01/15/21)   Social Determinants of Health   Financial Resource Strain: Low Risk  (05/20/2022)   Overall Financial Resource Strain (CARDIA)    Difficulty of Paying Living Expenses: Not very hard  Food Insecurity: No Food Insecurity (05/20/2022)   Hunger Vital Sign    Worried About Running Out of Food in the Last Year: Never true    [place] in the Last Year: Never true  Transportation Needs: No Transportation Needs (05/20/2022)   PRAPARE - Hydrologist (Medical): No    Lack of Transportation (Non-Medical): No  Physical Activity: Inactive (05/20/2022)   Exercise Vital Sign    Days of Exercise per Week: 0 days    Minutes of Exercise per Session: 0 min  Stress: No Stress Concern Present (05/20/2022)   [place]    Feeling of Stress : Only a little  Social Connections: Moderately Integrated (05/20/2022)   Social Connection and Isolation Panel [NHANES]    Frequency of Communication with Friends and Family: Twice a week    Frequency of Social Gatherings with Friends and Family: Once a week    Attends Religious Services: Never    Marine scientist or Organizations: Yes    Attends Archivist Meetings: Never    Marital Status: Living with partner  Family History: The patient's family history includes CAD in his father; Cancer in his brother and brother; Dementia in his sister; Emphysema in his sister; Leukemia in his sister; Lung cancer in his brother; Stroke in his mother.  ROS:   Review of Systems  Constitutional: Negative.   HENT: Negative.    Eyes: Negative.   Respiratory:  Positive for shortness of breath. Negative for cough, hemoptysis, sputum production and wheezing.        See HPI.   Cardiovascular:  Positive for chest pain. Negative for palpitations, orthopnea, claudication, leg swelling and PND.       See HPI.   Gastrointestinal: Negative.   Genitourinary: Negative.   Musculoskeletal: Negative.   Skin: Negative.   Neurological: Negative.   Endo/Heme/Allergies: Negative.   Psychiatric/Behavioral: Negative.      Please see the history of present illness.    All other systems reviewed and are negative.  EKGs/Labs/Other Studies Reviewed:    The following studies were reviewed today:   EKG:  EKG is not ordered today.    2D echocardiogram on September 01, 2021:  1. Left ventricular ejection fraction, by estimation, is 55 to 60%. The  left ventricle has normal function. The left ventricle demonstrates  regional wall motion abnormalities (see scoring diagram/findings for  description). There is mild left ventricular   hypertrophy. Left ventricular diastolic parameters are consistent with  Grade I diastolic dysfunction (impaired relaxation). Elevated left  ventricular end-diastolic pressure. The E/e' is 20. There is moderate  hypokinesis of the left ventricular, basal  inferoseptal wall, inferolateral wall and septal wall.   2. Right ventricular systolic function is hyperdynamic. The right  ventricular size is normal.   3. Left atrial size was mildly dilated.   4. The mitral valve is abnormal. Trivial mitral valve regurgitation.   5. The aortic valve is tricuspid. Aortic valve regurgitation is not  visualized.    6. The inferior vena cava is normal in size with greater than 50%  respiratory variability, suggesting right atrial pressure of 3 mmHg.   7. Cannot exclude a small PFO.   Comparison(s): Changes from prior study are noted. 05/07/2021: LVEF  50-55%, no regional wall motion abnormalities.  Left heart cath and coronary angiography on May 25, 2020: Previously placed Mid LAD stent (unknown type) is widely patent. Mid LAD to Dist LAD lesion is 50% stenosed. Dist LAD lesion is 50% stenosed. Prox RCA lesion is 60% stenosed. Mid RCA lesion is 50% stenosed. RV Branch lesion is 80% stenosed. LV end diastolic pressure is mildly elevated.   1. Modest 2 vessel CAD. The stent in the LAD is widely patent.     50% stenosis in the mid and distal LAD    60% proximal and 50% mid RCA. 80% diffuse disease in the RV marginal branch 2. LVEDP 16 mm Hg   Plan: would recommend medical therapy. No culprit lesion for his chest pain identified.   Recent Labs: 11/23/2021: Magnesium 2.3 01/24/2022: ALT 8 03/17/2022: B Natriuretic Peptide 375.0; BUN 12; Creatinine, Ser 1.55; Hemoglobin 12.4; Platelets 378; Potassium 2.9; Sodium 142  Recent Lipid Panel    Component Value Date/Time   CHOL 156 08/14/2021 1531   TRIG 157 (H) 08/14/2021 1531   HDL 48 08/14/2021 1531   CHOLHDL 3.3 08/14/2021 1531   CHOLHDL 3.8 05/25/2020 0453   VLDL 29 05/25/2020 0453   LDLCALC 81 08/14/2021 1531    Physical Exam:    VS:  BP (!) 122/50   Pulse Marland Kitchen)  59   Ht 5' 9"  (1.753 m)   Wt 170 lb (77.1 kg)   SpO2 92%   BMI 25.10 kg/m     Wt Readings from Last 3 Encounters:  05/22/22 170 lb (77.1 kg)  03/17/22 174 lb (78.9 kg)  02/28/22 174 lb (78.9 kg)     GEN: Well nourished, well developed in no acute distress HEENT: Normal NECK: No JVD; No carotid bruits CARDIAC: S1/S2, RRR, no murmurs, rubs, gallops; 2+ peripheral pulses throughout, strong and equal bilaterally RESPIRATORY:  Clear and diminished to auscultation without  rales, wheezing or rhonchi  MUSCULOSKELETAL:  No lower extremity edema, minimal, nonpitting edema along LUE >RUE (chronic per his report); No deformity  SKIN: Warm and dry NEUROLOGIC:  Alert and oriented x 3 PSYCHIATRIC:  Normal affect   ASSESSMENT:    1. Coronary artery disease involving native heart without angina pectoris, unspecified vessel or lesion type   2. Medication management   3. PAF (paroxysmal atrial fibrillation) ([place])   4. Mixed hyperlipidemia   5. Essential hypertension   6. Chronic obstructive pulmonary disease, unspecified COPD type ([place])   7. Tobacco abuse   8. Chronic renal failure, stage 3a (HCC)    PLAN:    In order of problems listed above:  CAD, s/p DES to LAD x 2, s/p NSTEMI in 2015 Does admit to chronic chest pain since 2015. Intermittent in nature and improved with NG. Cardiac cath in 2021 did not indicate intervention, medically managed. Will stop Imdur 10 mg daily and increase to Imdur 15 mg daily. Continue  bisoprolol, losartan, NG, and Crestor. Will refill NG. Heart healthy diet and regular cardiovascular exercise encouraged. '  2. Paroxysmal A-fib During previous hospital stay in 07/2021 it was found that he was in wide-complex irregular tachycardia in the ED felt to be A-fib with RVR.  CHA2DS2-VASc score 6.  Continue Eliquis 5 mg twice daily.  Currently on appropriate dosage.  Will stop aspirin since he is on Eliquis.  Continue bisoprolol. Heart healthy diet and regular cardiovascular exercise encouraged.  Will obtain CBC.  Mixed HLD Lipid profile in March 2023 revealed total cholesterol 156, HDL 48, triglycerides 157, and LDL 81.  Continue rosuvastatin.  At next follow-up visit, plan to discuss increasing rosuvastatin. Heart healthy diet and regular cardiovascular exercise encouraged.   HTN Blood pressure today 122/50.  BP overall well-controlled at home.  Continue current medication regimen.  Will obtain BMET today. Discussed to monitor BP at home at  least 2 hours after medications and sitting for 5-10 minutes. Heart healthy diet and regular cardiovascular exercise encouraged.   COPD Denies any worsening shortness of breath or acute exacerbations.  Will refer to pulmonology.  Continue current medication regimen.   Tobacco abuse Smoking cessation encouraged and discussed.  6. Chronic kidney disease stage 3a Creatinine on admission was around 1.7, which according to records is close to his baseline.  Will obtain repeat BMET today. Continue to follow with PCP.  7.  Disposition: Will obtain CBC and BMET today.  Follow-up with me in 8 weeks or sooner if anything changes.    Medication Adjustments/Labs and Tests Ordered: Current medicines are reviewed at length with the patient today.  Concerns regarding medicines are outlined above.  Orders Placed This Encounter  Procedures   Basic metabolic panel   CBC   Meds ordered this encounter  Medications   nitroGLYCERIN (NITROSTAT) 0.4 MG SL tablet    Sig: Place 1 tablet (0.4 mg total) under the tongue  every 5 (five) minutes as needed for chest pain.    Dispense:  25 tablet    Refill:  3   isosorbide mononitrate (IMDUR) 30 MG 24 hr tablet    Sig: Take 0.5 tablets (15 mg total) by mouth daily.    Dispense:  15 tablet    Refill:  6    Stopping Ismo, med changed 05/22/2022    Patient Instructions  Medication Instructions:  Stop Isosorbide (Ismo) Nitroglycerin refilled today.  Begin Imdur 91m daily  Stop Aspirin.  Continue all other medications.     Labwork: BMET, CBC - orders given today Office will contact with results via phone, letter or mychart.     Testing/Procedures: none  Follow-Up: 8 weeks   Any Other Special Instructions Will Be Listed Below (If Applicable). Smoking cessation info given today   If you need a refill on your cardiac medications before your next appointment, please call your pharmacy.    Signed, EFinis Bud NP  05/22/2022 9:21 PM    [place]

## 2022-05-22 ENCOUNTER — Encounter: Payer: Self-pay | Admitting: Nurse Practitioner

## 2022-05-22 ENCOUNTER — Ambulatory Visit: Payer: Medicare HMO | Attending: Internal Medicine | Admitting: Nurse Practitioner

## 2022-05-22 VITALS — BP 122/50 | HR 59 | Ht 69.0 in | Wt 170.0 lb

## 2022-05-22 DIAGNOSIS — N1831 Chronic kidney disease, stage 3a: Secondary | ICD-10-CM | POA: Diagnosis not present

## 2022-05-22 DIAGNOSIS — I10 Essential (primary) hypertension: Secondary | ICD-10-CM | POA: Diagnosis not present

## 2022-05-22 DIAGNOSIS — Z720 Tobacco use: Secondary | ICD-10-CM

## 2022-05-22 DIAGNOSIS — I2510 Atherosclerotic heart disease of native coronary artery without angina pectoris: Secondary | ICD-10-CM | POA: Diagnosis not present

## 2022-05-22 DIAGNOSIS — Z79899 Other long term (current) drug therapy: Secondary | ICD-10-CM | POA: Diagnosis not present

## 2022-05-22 DIAGNOSIS — I480 Paroxysmal atrial fibrillation: Secondary | ICD-10-CM

## 2022-05-22 DIAGNOSIS — J449 Chronic obstructive pulmonary disease, unspecified: Secondary | ICD-10-CM | POA: Diagnosis not present

## 2022-05-22 DIAGNOSIS — E782 Mixed hyperlipidemia: Secondary | ICD-10-CM

## 2022-05-22 MED ORDER — ISOSORBIDE MONONITRATE ER 30 MG PO TB24: 15.0000 mg | ORAL_TABLET | Freq: Every day | ORAL | 6 refills | Status: DC

## 2022-05-22 MED ORDER — NITROGLYCERIN 0.4 MG SL SUBL: 0.4000 mg | SUBLINGUAL_TABLET | SUBLINGUAL | 3 refills | Status: DC | PRN

## 2022-05-22 NOTE — Patient Instructions (Addendum)
Medication Instructions:  Stop Isosorbide (Ismo) Nitroglycerin refilled today.  Begin Imdur 67m daily  Stop Aspirin.  Continue all other medications.     Labwork: BMET, CBC - orders given today Office will contact with results via phone, letter or mychart.     Testing/Procedures: none  Follow-Up: 8 weeks   Any Other Special Instructions Will Be Listed Below (If Applicable). Smoking cessation info given today   If you need a refill on your cardiac medications before your next appointment, please call your pharmacy.

## 2022-05-25 ENCOUNTER — Other Ambulatory Visit: Payer: Self-pay | Admitting: Family

## 2022-05-25 DIAGNOSIS — I509 Heart failure, unspecified: Secondary | ICD-10-CM

## 2022-05-26 ENCOUNTER — Ambulatory Visit (INDEPENDENT_AMBULATORY_CARE_PROVIDER_SITE_OTHER): Payer: Medicare HMO | Admitting: Family

## 2022-05-26 ENCOUNTER — Encounter: Payer: Self-pay | Admitting: Family

## 2022-05-26 VITALS — BP 132/61 | HR 66 | Temp 98.3°F | Ht 69.0 in | Wt 170.6 lb

## 2022-05-26 DIAGNOSIS — F419 Anxiety disorder, unspecified: Secondary | ICD-10-CM

## 2022-05-26 DIAGNOSIS — E782 Mixed hyperlipidemia: Secondary | ICD-10-CM | POA: Diagnosis not present

## 2022-05-26 DIAGNOSIS — F331 Major depressive disorder, recurrent, moderate: Secondary | ICD-10-CM

## 2022-05-26 DIAGNOSIS — I10 Essential (primary) hypertension: Secondary | ICD-10-CM | POA: Diagnosis not present

## 2022-05-26 DIAGNOSIS — K219 Gastro-esophageal reflux disease without esophagitis: Secondary | ICD-10-CM | POA: Diagnosis not present

## 2022-05-26 DIAGNOSIS — I252 Old myocardial infarction: Secondary | ICD-10-CM

## 2022-05-26 DIAGNOSIS — J441 Chronic obstructive pulmonary disease with (acute) exacerbation: Secondary | ICD-10-CM

## 2022-05-26 DIAGNOSIS — Z720 Tobacco use: Secondary | ICD-10-CM | POA: Diagnosis not present

## 2022-05-26 DIAGNOSIS — E1142 Type 2 diabetes mellitus with diabetic polyneuropathy: Secondary | ICD-10-CM

## 2022-05-26 DIAGNOSIS — I5033 Acute on chronic diastolic (congestive) heart failure: Secondary | ICD-10-CM | POA: Diagnosis not present

## 2022-05-26 DIAGNOSIS — F1320 Sedative, hypnotic or anxiolytic dependence, uncomplicated: Secondary | ICD-10-CM | POA: Diagnosis not present

## 2022-05-26 DIAGNOSIS — R69 Illness, unspecified: Secondary | ICD-10-CM | POA: Diagnosis not present

## 2022-05-26 LAB — BAYER DCA HB A1C WAIVED: HB A1C (BAYER DCA - WAIVED): 7.9 % — ABNORMAL HIGH (ref 4.8–5.6)

## 2022-05-26 MED ORDER — BUSPIRONE HCL 7.5 MG PO TABS: 7.5000 mg | ORAL_TABLET | Freq: Three times a day (TID) | ORAL | 1 refills | Status: DC | PRN

## 2022-05-26 MED ORDER — ALBUTEROL SULFATE HFA 108 (90 BASE) MCG/ACT IN AERS: 2.0000 | INHALATION_SPRAY | Freq: Four times a day (QID) | RESPIRATORY_TRACT | 2 refills | Status: DC | PRN

## 2022-05-26 MED ORDER — BUPROPION HCL ER (XL) 150 MG PO TB24: 150.0000 mg | ORAL_TABLET | Freq: Every day | ORAL | 1 refills | Status: DC

## 2022-05-26 MED ORDER — ALPRAZOLAM 0.25 MG PO TABS: 0.2500 mg | ORAL_TABLET | Freq: Two times a day (BID) | ORAL | 2 refills | Status: DC | PRN

## 2022-05-26 NOTE — Progress Notes (Signed)
Subjective:    Patient ID: Ronald [name], male    DOB: [birth year], 76 y.o.   MRN: [id number]  Chief Complaint  Patient presents with   Medical Management of Chronic Issues   Pt presents to the office today for chronic follow up. He is currently using 2-3 L at night and as needed.  He has COPD. Continues to smoke 1/2 pack a day. Has intermittent SOB.    He is followed by  Cardiologists for A Fib, CHF, and CAD.  Hypertension This is a chronic problem. The current episode started more than 1 year ago. The problem has been resolved since onset. The problem is controlled. Associated symptoms include anxiety, blurred vision, malaise/fatigue and shortness of breath. Pertinent negatives include no peripheral edema. Risk factors for coronary artery disease include male gender, diabetes mellitus, smoking/tobacco exposure, sedentary lifestyle and dyslipidemia. The current treatment provides moderate improvement.  Congestive Heart Failure Presents for follow-up visit. Associated symptoms include edema, fatigue and shortness of breath. The symptoms have been stable.  Gastroesophageal Reflux He complains of belching and heartburn. This is a chronic problem. The current episode started more than 1 year ago. The problem occurs occasionally. The symptoms are aggravated by smoking. Associated symptoms include fatigue. He has tried a PPI for the symptoms. The treatment provided moderate relief.  Diabetes He presents for his follow-up diabetic visit. He has type 2 diabetes mellitus. Hypoglycemia symptoms include nervousness/anxiousness. Associated symptoms include blurred vision, fatigue and foot paresthesias. Symptoms are stable. Diabetic complications include heart disease and peripheral neuropathy. Risk factors for coronary artery disease include dyslipidemia, diabetes mellitus, hypertension, sedentary lifestyle and male sex. (Does not check regularly at home)  Hyperlipidemia This is a chronic problem. The  current episode started more than 1 year ago. The problem is controlled. Exacerbating diseases include obesity. Associated symptoms include shortness of breath. Current antihyperlipidemic treatment includes statins. The current treatment provides mild improvement of lipids. Risk factors for coronary artery disease include diabetes mellitus, hypertension, male sex and a sedentary lifestyle.  Depression        This is a chronic problem.  The current episode started more than 1 year ago.   The onset quality is gradual.   The problem occurs intermittently.  Associated symptoms include fatigue and sad.  Associated symptoms include no helplessness and no hopelessness.  Past medical history includes anxiety.   Anxiety Presents for follow-up visit. Symptoms include depressed mood, excessive worry, irritability, nervous/anxious behavior and shortness of breath. Symptoms occur most days.        Review of Systems  Constitutional:  Positive for fatigue, irritability and malaise/fatigue.  Eyes:  Positive for blurred vision.  Respiratory:  Positive for shortness of breath.   Gastrointestinal:  Positive for heartburn.  Psychiatric/Behavioral:  Positive for depression. The patient is nervous/anxious.   All other systems reviewed and are negative.      Objective:   Physical Exam Vitals reviewed.  Constitutional:      General: He is not in acute distress.    Appearance: He is well-developed.  HENT:     Head: Normocephalic.     Right Ear: Tympanic membrane normal.     Left Ear: Tympanic membrane normal.  Eyes:     General:        Right eye: No discharge.        Left eye: No discharge.     Pupils: Pupils are equal, round, and reactive to light.  Neck:     Thyroid: No  thyromegaly.  Cardiovascular:     Rate and Rhythm: Normal rate and regular rhythm.     Heart sounds: Normal heart sounds. No murmur heard. Pulmonary:     Effort: Pulmonary effort is normal. No respiratory distress.     Breath  sounds: Rhonchi present. No wheezing.  Abdominal:     General: Bowel sounds are normal. There is no distension.     Palpations: Abdomen is soft.     Tenderness: There is no abdominal tenderness.  Musculoskeletal:        General: No tenderness. Normal range of motion.     Cervical back: Normal range of motion and neck supple.     Comments: Lymphedema bilateral arms  Skin:    General: Skin is warm and dry.     Findings: No erythema or rash.  Neurological:     Mental Status: He is alert and oriented to person, place, and time.     Cranial Nerves: No cranial nerve deficit.     Deep Tendon Reflexes: Reflexes are normal and symmetric.  Psychiatric:        Behavior: Behavior normal.        Thought Content: Thought content normal.        Judgment: Judgment normal.       BP 132/61   Pulse 66   Temp 98.3 F (36.8 C) (Temporal)   Ht 5' 9"  (1.753 m)   Wt 170 lb 9.6 oz (77.4 kg)   SpO2 91%   BMI 25.19 kg/m      Assessment & Plan:   Ronald [name] comes in today with chief complaint of Medical Management of Chronic Issues   Diagnosis and orders addressed:  1. Anxiety - ALPRAZolam (XANAX) 0.25 MG tablet; Take 1 tablet (0.25 mg total) by mouth 2 (two) times daily as needed for anxiety.  Dispense: 60 tablet; Refill: 2 - busPIRone (BUSPAR) 7.5 MG tablet; Take 1 tablet (7.5 mg total) by mouth 3 (three) times daily as needed.  Dispense: 90 tablet; Refill: 1 - buPROPion (WELLBUTRIN XL) 150 MG 24 hr tablet; Take 1 tablet (150 mg total) by mouth daily.  Dispense: 90 tablet; Refill: 1  2. Benzodiazepine dependence (HCC) - ALPRAZolam (XANAX) 0.25 MG tablet; Take 1 tablet (0.25 mg total) by mouth 2 (two) times daily as needed for anxiety.  Dispense: 60 tablet; Refill: 2  3. Tobacco abuse  4. Mixed hyperlipidemia  5. History of MI (myocardial infarction)  6. Gastroesophageal reflux disease without esophagitis  7. Essential hypertension  8. Type 2 diabetes mellitus with diabetic  polyneuropathy, without long-term current use of insulin (HCC)  - Bayer DCA Hb A1c Waived  9. Diabetic polyneuropathy associated with type 2 diabetes mellitus ([place])  10. COPD with acute exacerbation ([place])   11. Moderate episode of recurrent major depressive disorder ([place])  12. Acute on chronic diastolic CHF (congestive heart failure) (HCC)   Added Wellbutrin 150 mg daily and Buspar 7.5 mg TID prn Labs pending Patient reviewed in [place] controlled database, no flags noted. Contract and drug screen are up to date.  Health Maintenance reviewed Diet and exercise encouraged  Follow up plan: 3 months    [name], FNP

## 2022-05-26 NOTE — Patient Instructions (Signed)
Generalized Anxiety Disorder, Adult ?Generalized anxiety disorder (GAD) is a mental health condition. Unlike normal worries, anxiety related to GAD is not triggered by a specific event. These worries do not fade or get better with time. GAD interferes with relationships, work, and school. ?GAD symptoms can vary from mild to severe. People with severe GAD can have intense waves of anxiety with physical symptoms that are similar to panic attacks. ?What are the causes? ?The exact cause of GAD is not known, but the following are believed to have an impact: ?Differences in natural brain chemicals. ?Genes passed down from parents to children. ?Differences in the way threats are perceived. ?Development and stress during childhood. ?Personality. ?What increases the risk? ?The following factors may make you more likely to develop this condition: ?Being male. ?Having a family history of anxiety disorders. ?Being very shy. ?Experiencing very stressful life events, such as the death of a loved one. ?Having a very stressful family environment. ?What are the signs or symptoms? ?People with GAD often worry excessively about many things in their lives, such as their health and family. Symptoms may also include: ?Mental and emotional symptoms: ?Worrying excessively about natural disasters. ?Fear of being late. ?Difficulty concentrating. ?Fears that others are judging your performance. ?Physical symptoms: ?Fatigue. ?Headaches, muscle tension, muscle twitches, trembling, or feeling shaky. ?Feeling like your heart is pounding or beating very fast. ?Feeling out of breath or like you cannot take a deep breath. ?Having trouble falling asleep or staying asleep, or experiencing restlessness. ?Sweating. ?Nausea, diarrhea, or irritable bowel syndrome (IBS). ?Behavioral symptoms: ?Experiencing erratic moods or irritability. ?Avoidance of new situations. ?Avoidance of people. ?Extreme difficulty making decisions. ?How is this diagnosed? ?This  condition is diagnosed based on your symptoms and medical history. You will also have a physical exam. Your health care provider may perform tests to rule out other possible causes of your symptoms. ?To be diagnosed with GAD, a person must have anxiety that: ?Is out of his or her control. ?Affects several different aspects of his or her life, such as work and relationships. ?Causes distress that makes him or her unable to take part in normal activities. ?Includes at least three symptoms of GAD, such as restlessness, fatigue, trouble concentrating, irritability, muscle tension, or sleep problems. ?Before your health care provider can confirm a diagnosis of GAD, these symptoms must be present more days than they are not, and they must last for 6 months or longer. ?How is this treated? ?This condition may be treated with: ?Medicine. Antidepressant medicine is usually prescribed for long-term daily control. Anti-anxiety medicines may be added in severe cases, especially when panic attacks occur. ?Talk therapy (psychotherapy). Certain types of talk therapy can be helpful in treating GAD by providing support, education, and guidance. Options include: ?Cognitive behavioral therapy (CBT). People learn coping skills and self-calming techniques to ease their physical symptoms. They learn to identify unrealistic thoughts and behaviors and to replace them with more appropriate thoughts and behaviors. ?Acceptance and commitment therapy (ACT). This treatment teaches people how to be mindful as a way to cope with unwanted thoughts and feelings. ?Biofeedback. This process trains you to manage your body's response (physiological response) through breathing techniques and relaxation methods. You will work with a therapist while machines are used to monitor your physical symptoms. ?Stress management techniques. These include yoga, meditation, and exercise. ?A mental health specialist can help determine which treatment is best for you.  Some people see improvement with one type of therapy. However, other people require   a combination of therapies. ?Follow these instructions at home: ?Lifestyle ?Maintain a consistent routine and schedule. ?Anticipate stressful situations. Create a plan and allow extra time to work with your plan. ?Practice stress management or self-calming techniques that you have learned from your therapist or your health care provider. ?Exercise regularly and spend time outdoors. ?Eat a healthy diet that includes plenty of vegetables, fruits, whole grains, low-fat dairy products, and lean protein. ?Do not eat a lot of foods that are high in fat, added sugar, or salt (sodium). ?Drink plenty of water. ?Avoid alcohol. Alcohol can increase anxiety. ?Avoid caffeine and certain over-the-counter cold medicines. These may make you feel worse. Ask your pharmacist which medicines to avoid. ?General instructions ?Take over-the-counter and prescription medicines only as told by your health care provider. ?Understand that you are likely to have setbacks. Accept this and be kind to yourself as you persist to take better care of yourself. ?Anticipate stressful situations. Create a plan and allow extra time to work with your plan. ?Recognize and accept your accomplishments, even if you judge them as small. ?Spend time with people who care about you. ?Keep all follow-up visits. This is important. ?Where to find more information ?National Institute of Mental Health: www.nimh.nih.gov ?Substance Abuse and Mental Health Services: www.samhsa.gov ?Contact a health care provider if: ?Your symptoms do not get better. ?Your symptoms get worse. ?You have signs of depression, such as: ?A persistently sad or irritable mood. ?Loss of enjoyment in activities that used to bring you joy. ?Change in weight or eating. ?Changes in sleeping habits. ?Get help right away if: ?You have thoughts about hurting yourself or others. ?If you ever feel like you may hurt  yourself or others, or have thoughts about taking your own life, get help right away. Go to your nearest emergency department or: ?Call your local emergency services (911 in the [place] ?Call a suicide crisis helpline, such as the National Suicide Prevention Lifeline at 1-800-273-8255 or 988 in the [place] This is open 24 hours a day in the [place] ?Text the Crisis Text Line at 741741 (in the [place] ?Summary ?Generalized anxiety disorder (GAD) is a mental health condition that involves worry that is not triggered by a specific event. ?People with GAD often worry excessively about many things in their lives, such as their health and family. ?GAD may cause symptoms such as restlessness, trouble concentrating, sleep problems, frequent sweating, nausea, diarrhea, headaches, and trembling or muscle twitching. ?A mental health specialist can help determine which treatment is best for you. Some people see improvement with one type of therapy. However, other people require a combination of therapies. ?This information is not intended to replace advice given to you by your health care provider. Make sure you discuss any questions you have with your health care provider. ?Document Revised: 12/19/2020 Document Reviewed: 09/16/2020 ?Elsevier Patient Education ? 2023 Elsevier Inc. ? ?

## 2022-05-27 ENCOUNTER — Other Ambulatory Visit: Payer: Self-pay | Admitting: Family

## 2022-05-27 ENCOUNTER — Telehealth: Payer: Self-pay | Admitting: Family Medicine

## 2022-05-27 ENCOUNTER — Telehealth: Payer: Self-pay | Admitting: Family

## 2022-05-27 MED ORDER — POTASSIUM CHLORIDE CRYS ER 20 MEQ PO TBCR: 20.0000 meq | EXTENDED_RELEASE_TABLET | Freq: Two times a day (BID) | ORAL | 0 refills | Status: DC

## 2022-05-27 MED ORDER — EMPAGLIFLOZIN 25 MG PO TABS: 25.0000 mg | ORAL_TABLET | Freq: Every day | ORAL | 1 refills | Status: DC

## 2022-05-27 NOTE — Telephone Encounter (Signed)
Daughter in law aware

## 2022-05-27 NOTE — Telephone Encounter (Signed)
Tablets sent in

## 2022-05-28 ENCOUNTER — Telehealth: Payer: Self-pay | Admitting: Family

## 2022-05-28 NOTE — Telephone Encounter (Signed)
Returning call. Please call back.

## 2022-05-28 NOTE — Telephone Encounter (Signed)
Patient's daughter in law calling to confirm the medications that patient is supposed to be taking. Please call back

## 2022-05-29 NOTE — Telephone Encounter (Signed)
Pt's daughter in law (on [place]) states pt hasn't been on losartan for months so she didn't know if he needed to restart it or should we just take it off his med list. Please advise.

## 2022-05-29 NOTE — Telephone Encounter (Signed)
Losartan taken off medication list.   [name], FNP

## 2022-06-03 DIAGNOSIS — I5030 Unspecified diastolic (congestive) heart failure: Secondary | ICD-10-CM | POA: Diagnosis not present

## 2022-06-05 DIAGNOSIS — J439 Emphysema, unspecified: Secondary | ICD-10-CM | POA: Diagnosis not present

## 2022-06-05 NOTE — Telephone Encounter (Signed)
closed

## 2022-06-06 NOTE — Telephone Encounter (Signed)
Recommend following Christy's instructions from 05/28/22. Do not add back. Monitor BP at home.

## 2022-06-08 DIAGNOSIS — J449 Chronic obstructive pulmonary disease, unspecified: Secondary | ICD-10-CM

## 2022-06-08 DIAGNOSIS — E1169 Type 2 diabetes mellitus with other specified complication: Secondary | ICD-10-CM | POA: Diagnosis not present

## 2022-06-08 DIAGNOSIS — R69 Illness, unspecified: Secondary | ICD-10-CM | POA: Diagnosis not present

## 2022-06-08 DIAGNOSIS — F17210 Nicotine dependence, cigarettes, uncomplicated: Secondary | ICD-10-CM | POA: Diagnosis not present

## 2022-06-11 ENCOUNTER — Telehealth: Payer: Self-pay

## 2022-06-11 DIAGNOSIS — Z79899 Other long term (current) drug therapy: Secondary | ICD-10-CM

## 2022-06-11 DIAGNOSIS — N1831 Chronic kidney disease, stage 3a: Secondary | ICD-10-CM

## 2022-06-11 NOTE — Telephone Encounter (Signed)
I spoke with son. He will have his father repeat bmet at [facility] in 2 weeks. I will mail lab slip to him.

## 2022-06-11 NOTE — Telephone Encounter (Signed)
-----   Message from Finis Bud, NP sent at 06/09/2022 10:25 AM EST ----- Labs overall stable. Losartan removed off med list recently. Let's repeat BMET in 2 weeks for CKD stage 3a and he will follow up with me in 1 month.   Thanks!  Finis Bud, AGNP-C

## 2022-06-20 LAB — HM DIABETES EYE EXAM

## 2022-06-21 ENCOUNTER — Other Ambulatory Visit: Payer: Self-pay | Admitting: Family

## 2022-06-21 DIAGNOSIS — I509 Heart failure, unspecified: Secondary | ICD-10-CM

## 2022-06-24 ENCOUNTER — Ambulatory Visit (INDEPENDENT_AMBULATORY_CARE_PROVIDER_SITE_OTHER): Payer: Medicare HMO | Admitting: *Deleted

## 2022-06-24 DIAGNOSIS — E1142 Type 2 diabetes mellitus with diabetic polyneuropathy: Secondary | ICD-10-CM

## 2022-06-24 DIAGNOSIS — J441 Chronic obstructive pulmonary disease with (acute) exacerbation: Secondary | ICD-10-CM

## 2022-06-24 NOTE — Telephone Encounter (Signed)
-----  Message from Kassie Mends, RN sent at 06/24/2022 10:46 AM EST ----- Regarding: trelegy samples Mr. Otterson states he needs more trelegy samples if you have any,  can you please let them know if you do and they will pickup.   tahnks

## 2022-06-24 NOTE — Chronic Care Management (AMB) (Signed)
Chronic Care Management   CCM RN Visit Note  06/24/2022 Name: Ronald [name] MRN: [id number] DOB: 1946-01-25  Subjective: Ronald [name] is a 77 y.o. year old male who is a primary care patient of Ronald Balloon, FNP. The patient was referred to the Chronic Care Management team for assistance with care management needs subsequent to provider initiation of CCM services and plan of care.    Today's Visit:  Engaged with patient by telephone for follow up visit.        Goals Addressed             This Visit's Progress    CCM (COPD) EXPECTED OUTCOME: MONITOR, SELF-MANAGE AND REDUCE SYMPTOMS OF COPD       Current Barriers:  Knowledge Deficits related to COPD management Chronic Disease Management support and education needs related to COPD, medications Literacy barriers Non-adherence to prescribed medication regimen No Advanced Directives in place- pt declines Patient reports he uses oxygen at 3 liters at hs as needed, Ronald [name] (ex wife) reports pt now has albuterol inhaler, xanax and is taking potassium as ordered, reports pt is almost out of trelegy and needs samples Patient reports he smokes 1 ppd and not interested in smoking cessation Per daughter in law Ronald [name], pt is set in his ways and not willing to change, family has tried and it does no good, not worth the hard feelings it has caused in the past  Planned Interventions: Advised patient to track and manage COPD triggers Advised patient to self assesses COPD action plan zone and make appointment with provider if in the yellow zone for 48 hours without improvement Advised patient to engage in light exercise as tolerated 3-5 days a week to aid in the the management of COPD Provided education about and advised patient to utilize infection prevention strategies to reduce risk of respiratory infection Discussed the importance of adequate rest and management of fatigue with COPD Reviewed importance of breathing exercises, relaxation in  management of COPD Reinforced importance of smoking cessation Reinforced- cannot smoke around oxygen In basket message sent to [place] requesting samples for trelegy  Encouraged [name]/ pt to call primary care provider office ahead of time when needing samples for any medications/ inhalers  Symptom Management: Take medications as prescribed   Attend all scheduled provider appointments Call pharmacy for medication refills 3-7 days in advance of running out of medications Attend church or other social activities Perform all self care activities independently  Call provider office for new concerns or questions  eliminate smoking in my home identify and remove indoor air pollutants limit outdoor activity during cold weather listen for public air quality announcements every day do breathing exercises every day develop a rescue plan eliminate symptom triggers at home follow rescue plan if symptoms flare-up eat healthy/prescribed diet: heart healthy, low sodium get at least 7 to 8 hours of sleep at night practice relaxation or meditation daily do breathing exercises every day Do not smoke around oxygen Follow COPD action plan- call your doctor early on for change in health status, symptoms Avoid sick people, wear a mask as needed, practice good handwashing Message sent to primary care provider office about trelegy samples, please follow up with them today to see if they have available/ ready  Follow Up Plan: Telephone follow up appointment with care management team member scheduled for: 08/06/22 at 3 pm       CCM (DIABETES) EXPECTED OUTCOME:  MONITOR, SELF-MANAGE AND REDUCE SYMPTOMS OF DIABETES  Current Barriers:  Knowledge Deficits related to Diabetes management Chronic Disease Management support and education needs related to Diabetes and diet Literacy barriers Non-adherence to prescribed medication regimen No Advanced Directives in place- pt declines information Spoke  with patient and ex-wife Ronald [name] (per pt request), reports independent with ADL's, cannot read or write and ex-wife and daughter in [place], physiological (nurse) provide some assistance, Ronald [name] oversees medications and Ronald [name] reports pt does not let Ronald [name] know when he is out of as needed medications such as albuterol inhaler or xanax.   Patient states he does not have diabetes, has glucometer but does not check CBG and states , Ronald [name] reports pt is not willing to do any of this and does not acknowledge that he has diabetes Ronald [name] (daughter in law)  tries to provide oversight with medications but pt will not let her know when he is out of prn medications.   Pt is unable to use a telephone and cannot call in his own refills and Ronald [name] will not be responsible for this. Spoke with Ronald [name] 06/24/22, she states pt does have all medications and taking as prescribed, states Ronald [name] is still providing oversight  Planned Interventions: Reviewed medications with patient and discussed importance of medication adherence;        Reviewed prescribed diet with patient carbohydrate ; Counseled on importance of regular laboratory monitoring as prescribed;        Discussed plans with patient for ongoing care management follow up and provided patient with direct contact information for care management team;      Reviewed scheduled/upcoming provider appointments including: primary care provider 08/28/22 at 210 pm;         call provider for findings outside established parameters;        Symptom Management: Take medications as prescribed   Attend all scheduled provider appointments Call pharmacy for medication refills 3-7 days in advance of running out of medications Attend church or other social activities Perform all self care activities independently  Perform IADL's (shopping, preparing meals, housekeeping, managing finances) independently Call provider office for new concerns or questions  check blood sugar at prescribed times: when you  have symptoms of low or high blood sugar and per doctor order  check feet daily for cuts, sores or redness enter blood sugar readings and medication or insulin into daily log take the blood sugar log to all doctor visits take the blood sugar meter to all doctor visits fill half of plate with vegetables limit fast food meals to no more than 1 per week manage portion size prepare main meal at home 3 to 5 days each week keep feet up while sitting Call in advance to primary care provider for any medication/ inhalers samples you may need Let your family know when refills are needed  Follow Up Plan: Telephone follow up appointment with care management team member scheduled for: 08/06/22 at 3 pm          Plan:Telephone follow up appointment with care management team member scheduled for:  08/06/22 at 3 pm  Jacqlyn Larsen [facility], BSN RN Case Manager [place] [phone]

## 2022-06-24 NOTE — Patient Instructions (Signed)
Please call the care guide team at [phone] if you need to cancel or reschedule your appointment.   If you are experiencing a Mental Health or [place] or need someone to talk to, please call the Suicide and Crisis Lifeline: 988 call the [place] National Suicide Prevention Lifeline: 352-562-5904 or TTY: (539)423-3765 TTY 740-813-6806) to talk to a trained counselor call 1-800-273-TALK (toll free, 24 hour hotline) go to [facility] [address] [place] [phone]) call the [facility]: [phone] call 911   Following is a copy of the CCM Program Consent:  CCM service includes personalized support from designated clinical staff supervised by the physician, including individualized plan of care and coordination with other care providers 24/7 contact phone numbers for assistance for urgent and routine care needs. Service will only be billed when office clinical staff spend 20 minutes or more in a month to coordinate care. Only one practitioner may furnish and bill the service in a calendar month. The patient may stop CCM services at amy time (effective at the end of the month) by phone call to the office staff. The patient will be responsible for cost sharing (co-pay) or up to 20% of the service fee (after annual deductible is met)  Following is a copy of your full provider care plan:   Goals Addressed             This Visit's Progress    CCM (COPD) EXPECTED OUTCOME: MONITOR, SELF-MANAGE AND REDUCE SYMPTOMS OF COPD       Current Barriers:  Knowledge Deficits related to COPD management Chronic Disease Management support and education needs related to COPD, medications Literacy barriers Non-adherence to prescribed medication regimen No Advanced Directives in place- pt declines Patient reports he uses oxygen at 3 liters at hs as needed, [name] (ex wife) reports pt now has albuterol inhaler, xanax and is taking  potassium as ordered, reports pt is almost out of trelegy and needs samples Patient reports he smokes 1 ppd and not interested in smoking cessation Per daughter in law [name], pt is set in his ways and not willing to change, family has tried and it does no good, not worth the hard feelings it has caused in the past  Planned Interventions: Advised patient to track and manage COPD triggers Advised patient to self assesses COPD action plan zone and make appointment with provider if in the yellow zone for 48 hours without improvement Advised patient to engage in light exercise as tolerated 3-5 days a week to aid in the the management of COPD Provided education about and advised patient to utilize infection prevention strategies to reduce risk of respiratory infection Discussed the importance of adequate rest and management of fatigue with COPD Reviewed importance of breathing exercises, relaxation in management of COPD Reinforced importance of smoking cessation Reinforced- cannot smoke around oxygen In basket message sent to [place] requesting samples for trelegy  Encouraged [name]/ pt to call primary care provider office ahead of time when needing samples for any medications/ inhalers  Symptom Management: Take medications as prescribed   Attend all scheduled provider appointments Call pharmacy for medication refills 3-7 days in advance of running out of medications Attend church or other social activities Perform all self care activities independently  Call provider office for new concerns or questions  eliminate smoking in my home identify and remove indoor air pollutants limit outdoor activity during cold weather listen for public air quality announcements every day  do breathing exercises every day develop a rescue plan eliminate symptom triggers at home follow rescue plan if symptoms flare-up eat healthy/prescribed diet: heart healthy, low sodium get at least 7 to 8 hours of  sleep at night practice relaxation or meditation daily do breathing exercises every day Do not smoke around oxygen Follow COPD action plan- call your doctor early on for change in health status, symptoms Avoid sick people, wear a mask as needed, practice good handwashing Message sent to primary care provider office about trelegy samples, please follow up with them today to see if they have available/ ready  Follow Up Plan: Telephone follow up appointment with care management team member scheduled for: 08/06/22 at 3 pm       CCM (DIABETES) EXPECTED OUTCOME:  MONITOR, SELF-MANAGE AND REDUCE SYMPTOMS OF DIABETES       Current Barriers:  Knowledge Deficits related to Diabetes management Chronic Disease Management support and education needs related to Diabetes and diet Literacy barriers Non-adherence to prescribed medication regimen No Advanced Directives in place- pt declines information Spoke with patient and ex-wife [name] (per pt request), reports independent with ADL's, cannot read or write and ex-wife and daughter in Scientist, physiological (nurse) provide some assistance, [name] oversees medications and [name] reports pt does not let [name] know when he is out of as needed medications such as albuterol inhaler or xanax.   Patient states he does not have diabetes, has glucometer but does not check CBG and states , [name] reports pt is not willing to do any of this and does not acknowledge that he has diabetes [name] (daughter in law)  tries to provide oversight with medications but pt will not let her know when he is out of prn medications.   Pt is unable to use a telephone and cannot call in his own refills and [name] will not be responsible for this. Spoke with [name] 06/24/22, she states pt does have all medications and taking as prescribed, states [name] is still providing oversight  Planned Interventions: Reviewed medications with patient and discussed importance of medication adherence;        Reviewed  prescribed diet with patient carbohydrate ; Counseled on importance of regular laboratory monitoring as prescribed;        Discussed plans with patient for ongoing care management follow up and provided patient with direct contact information for care management team;      Reviewed scheduled/upcoming provider appointments including: primary care provider 08/28/22 at 210 pm;         call provider for findings outside established parameters;        Symptom Management: Take medications as prescribed   Attend all scheduled provider appointments Call pharmacy for medication refills 3-7 days in advance of running out of medications Attend church or other social activities Perform all self care activities independently  Perform IADL's (shopping, preparing meals, housekeeping, managing finances) independently Call provider office for new concerns or questions  check blood sugar at prescribed times: when you have symptoms of low or high blood sugar and per doctor order  check feet daily for cuts, sores or redness enter blood sugar readings and medication or insulin into daily log take the blood sugar log to all doctor visits take the blood sugar meter to all doctor visits fill half of plate with vegetables limit fast food meals to no more than 1 per week manage portion size prepare main meal at home 3 to 5 days each week keep feet up while sitting  Call in advance to primary care provider for any medication/ inhalers samples you may need Let your family know when refills are needed  Follow Up Plan: Telephone follow up appointment with care management team member scheduled for: 08/06/22 at 3 pm          The patient verbalized understanding of instructions, educational materials, and care plan provided today and DECLINED offer to receive copy of patient instructions, educational materials, and care plan.   Telephone follow up appointment with care management team member scheduled for:  08/06/22  at 3 pm

## 2022-06-25 DIAGNOSIS — N1831 Chronic kidney disease, stage 3a: Secondary | ICD-10-CM | POA: Diagnosis not present

## 2022-07-04 DIAGNOSIS — I5030 Unspecified diastolic (congestive) heart failure: Secondary | ICD-10-CM | POA: Diagnosis not present

## 2022-07-06 DIAGNOSIS — J439 Emphysema, unspecified: Secondary | ICD-10-CM | POA: Diagnosis not present

## 2022-07-07 ENCOUNTER — Encounter: Payer: Self-pay | Admitting: *Deleted

## 2022-07-07 ENCOUNTER — Encounter: Payer: Self-pay | Admitting: Internal Medicine

## 2022-07-07 ENCOUNTER — Institutional Professional Consult (permissible substitution): Payer: Medicare HMO | Admitting: Internal Medicine

## 2022-07-07 ENCOUNTER — Other Ambulatory Visit: Payer: Self-pay | Admitting: *Deleted

## 2022-07-07 ENCOUNTER — Ambulatory Visit (INDEPENDENT_AMBULATORY_CARE_PROVIDER_SITE_OTHER): Payer: Medicare HMO | Admitting: Internal Medicine

## 2022-07-07 VITALS — BP 136/80 | HR 62 | Temp 97.6°F | Ht 69.0 in | Wt 171.0 lb

## 2022-07-07 DIAGNOSIS — N1831 Chronic kidney disease, stage 3a: Secondary | ICD-10-CM

## 2022-07-07 DIAGNOSIS — J398 Other specified diseases of upper respiratory tract: Secondary | ICD-10-CM | POA: Diagnosis not present

## 2022-07-07 DIAGNOSIS — J449 Chronic obstructive pulmonary disease, unspecified: Secondary | ICD-10-CM | POA: Diagnosis not present

## 2022-07-07 DIAGNOSIS — R69 Illness, unspecified: Secondary | ICD-10-CM | POA: Diagnosis not present

## 2022-07-07 DIAGNOSIS — Z79899 Other long term (current) drug therapy: Secondary | ICD-10-CM

## 2022-07-07 DIAGNOSIS — J441 Chronic obstructive pulmonary disease with (acute) exacerbation: Secondary | ICD-10-CM | POA: Diagnosis not present

## 2022-07-07 DIAGNOSIS — F17210 Nicotine dependence, cigarettes, uncomplicated: Secondary | ICD-10-CM

## 2022-07-07 NOTE — Patient Instructions (Addendum)
My office will be contacting you by phone for lung cancer screening   - if you don't hear back from my office within one week please call us back or notify us thru MyChart and we'll address it right away.   The key is to stop smoking completely before smoking completely stops you!  Please call Dr Carol Ada  [phone] to follow up your problem with your tracheal obstruction  Work on inhaler technique:  relax and gently blow all the way out then take a nice smooth full deep breath back in, triggering the inhaler at same time you start breathing in.    Blow out Trelegy  thru nose. Rinse and gargle with water when done.  If mouth or throat bother you at all,  try brushing teeth/gums/tongue with arm and hammer toothpaste/ make a slurry and gargle and spit out.   Please remember to go to the lab department   for your tests - we will call you with the results when they are available.      Please schedule a follow up visit in 12  months but call sooner if needed

## 2022-07-07 NOTE — Assessment & Plan Note (Signed)
S/p Trach in 2016 "x 6 months" > f/u [facility] wfu  - Dg Es 03/12/20 Narrowing of the gastroesophageal junction transiently obstructing a 12.5 mm diameter barium tablet, with tablet only passing into the stomach following several swallows of thick barium.  Minimal AP narrowing of the upper cervical esophagus at C4-C5, did not obstruct the barium tablet.  Mild laryngeal penetration without aspiration. 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

## 2022-07-07 NOTE — Assessment & Plan Note (Addendum)
Active smoker  - 12/08/2019  After extensive coaching inhaler device,  effectiveness =    90% with elipta > try trelegy  - PFT's  04/04/20  FEV1 1.02 (33 % ) ratio 0.39  p 8 % improvement from saba p saba 6h prior to study with DLCO  9.74(39%) corrects to 2.37 (58%)  for alv volume and FV curve atypical features both on insp and exp but no true plateau  - alpha one AT phenotype 07/07/2022  - 07/07/2022  After extensive coaching inhaler device,  effectiveness =    25% hfa/ 90% dpi > continue trlelegy - 07/07/2022   Walked on RA  x  3  lap(s) =  approx 450  ft  @ mod pace, stopped due to end of study s sob  with lowest 02 sats 94%    DDX of  difficult airways management almost all start with A and  include Adherence, Ace Inhibitors, Acid Reflux, Active Sinus Disease, Alpha 1 Antitripsin deficiency, Anxiety masquerading as Airways dz,  ABPA,  Allergy(esp in young), Aspiration (esp in elderly), Adverse effects of meds,  Active smoking or vaping, A bunch of PE's (a small clot burden can't cause this syndrome unless there is already severe underlying pulm or vascular dz with poor reserve) plus two Bs  = Bronchiectasis and Beta blocker use..and one C= CHF  Adherence is always the initial "prime suspect" and is a multilayered concern that requires a "trust but verify" approach in every patient - starting with knowing how to use medications, especially inhalers, correctly, keeping up with refills and understanding the fundamental difference between maintenance and prns vs those medications only taken for a very short course and then stopped and not refilled.  - see hfa/ dpi teaching above - return with all meds in hand using a trust but verify approach to confirm accurate Medication  Reconciliation The principal here is that until we are certain that the  patients are doing what we've asked, it makes no sense to ask them to do more.   Active smoking (see separate a/p)   ? Adverse drug effects > most concerned  with amiodarone: Patients typically have been on amiodarone for 6-12 months before this complication manifests.  Of note, serial clinical evaluation for symptoms such as cough dyspnea or fevers is  the preferred method of monitoring for pulmonary toxicity because a decrease in DLCO or lung volumes is a nonspecific for toxicity. Pathologically amiodarone pulmonary toxicity may appear as interstitial pneumonitis, eosinophilic pneumonia, organizing pneumonia, pulmonary fibrosis or less commonly as diffuse alveolar hemorrhage, pulmonary nodules or pleural effusions.  Risk factors for pulmonary toxicity include age greater than 6, daily dose greater than equal to 400 mg, a high cumulative dose, or pre-existing lung disease   >>> he has def 2/4 risk factors, encourage to self monitor sats with ex and f/u q 12 m, sooner prn

## 2022-07-07 NOTE — Assessment & Plan Note (Signed)
Counseled re importance of smoking cessation but did not meet time criteria for separate billing    Low-dose CT lung cancer screening is recommended for patients who are 72-75 years of age with a 20+ pack-year history of smoking and who are currently smoking or quit <=15 years ago. No coughing up blood  No unintentional weight loss of > 15 pounds in the last 6 months - pt is eligible for scanning yearly until age 48 >>> referred for lung cancer screening   Discussed in detail all the  indications, usual  risks and alternatives  relative to the benefits with patient who agrees to proceed with w/u as outlined.     Each maintenance medication was reviewed in detail including emphasizing most importantly the difference between maintenance and prns and under what circumstances the prns are to be triggered using an action plan format where appropriate.  Total time for H and P, chart review, counseling, reviewing hfa/dpi device(s) , directly observing portions of ambulatory 02 saturation study/ and generating customized AVS unique to this office visit / same day charting > 45 min with pt not seen by me in > 3 y
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
## 2022-07-08 ENCOUNTER — Other Ambulatory Visit: Payer: Self-pay | Admitting: Family

## 2022-07-09 DIAGNOSIS — E1159 Type 2 diabetes mellitus with other circulatory complications: Secondary | ICD-10-CM | POA: Diagnosis not present

## 2022-07-09 DIAGNOSIS — R69 Illness, unspecified: Secondary | ICD-10-CM | POA: Diagnosis not present

## 2022-07-09 DIAGNOSIS — F17210 Nicotine dependence, cigarettes, uncomplicated: Secondary | ICD-10-CM

## 2022-07-09 DIAGNOSIS — J449 Chronic obstructive pulmonary disease, unspecified: Secondary | ICD-10-CM

## 2022-07-11 LAB — ALPHA-1-ANTITRYPSIN PHENOTYP: A-1 Antitrypsin: 160 mg/dL (ref 101–187)

## 2022-07-17 ENCOUNTER — Encounter (HOSPITAL_COMMUNITY): Payer: Self-pay | Admitting: *Deleted

## 2022-07-24 ENCOUNTER — Encounter: Payer: Self-pay | Admitting: Nurse Practitioner

## 2022-07-24 ENCOUNTER — Ambulatory Visit: Payer: Medicare HMO | Attending: Nurse Practitioner | Admitting: Nurse Practitioner

## 2022-07-24 VITALS — BP 146/56 | HR 57 | Wt 173.6 lb

## 2022-07-24 DIAGNOSIS — J449 Chronic obstructive pulmonary disease, unspecified: Secondary | ICD-10-CM

## 2022-07-24 DIAGNOSIS — N1831 Chronic kidney disease, stage 3a: Secondary | ICD-10-CM

## 2022-07-24 DIAGNOSIS — I25118 Atherosclerotic heart disease of native coronary artery with other forms of angina pectoris: Secondary | ICD-10-CM

## 2022-07-24 DIAGNOSIS — E782 Mixed hyperlipidemia: Secondary | ICD-10-CM | POA: Diagnosis not present

## 2022-07-24 DIAGNOSIS — Z720 Tobacco use: Secondary | ICD-10-CM

## 2022-07-24 DIAGNOSIS — R7989 Other specified abnormal findings of blood chemistry: Secondary | ICD-10-CM | POA: Diagnosis not present

## 2022-07-24 DIAGNOSIS — I480 Paroxysmal atrial fibrillation: Secondary | ICD-10-CM

## 2022-07-24 DIAGNOSIS — I10 Essential (primary) hypertension: Secondary | ICD-10-CM

## 2022-07-24 DIAGNOSIS — N1832 Chronic kidney disease, stage 3b: Secondary | ICD-10-CM

## 2022-07-24 MED ORDER — ISOSORBIDE MONONITRATE ER 30 MG PO TB24: 30.0000 mg | ORAL_TABLET | Freq: Every day | ORAL | 3 refills | Status: DC

## 2022-07-24 NOTE — Progress Notes (Signed)
Cardiology Office Note:    Date:  07/24/2022  ID:  Ronald [name], DOB [birth year], MRN [id number]  PCP:  [name], [place] Providers Cardiologist:  Carlyle Dolly, MD     Referring MD: [name], FNP   CC: Here for follow-up   History of Present Illness:    Ronald [name] is a 77 y.o. male with a hx of the following:  CAD, s/p DES to LAD x 2, s/p NSTEMI in 2015 COPD HTN Hx of CVA/TIA T2DM Tobacco abuse UC/GERD PAF- on Eliquis  Patient is a 77 year old male with past medical history as mentioned above.  In July 2022 he was seen for sputum production and shortness of breath, PCR was positive for COVID-19 and was admitted to the hospital and treated with IV steroids and remdesivir.   Last seen by Levell July, NP on February 07, 2021 for 42-monthfollow-up visit.  Continued to note chronic dyspnea.  BP elevated at 180/88.  Denied any chest pain or acute cardiac complaints.  Stated that he had not seen pulmonology.  Reported using oxygen at night.  Referred to pulmonology.  Was told to follow-up in 6 months.  [facility]22 with shortness of breath.  BNP minimally elevated at 375.  Troponin elevated at 74.  Potassium 2.9.  EKG revealed sinus rhythm.  Symptoms improved with nebulizer treatments.  Troponins were flat.  It was recommended he be admitted to the hospital for observation, patient refused.  His fluid pill was increased as well as his potassium and was told to follow-up with his cardiologist.  [facility] complaint of shortness of breath as well as cough.  O2 saturation was 84% on room air when EMS arrived.  Denied any flulike symptoms, denied sick contacts.  Chest x-ray did not show anything acute.  Initial troponin 149, second troponin 143.  Patient reported wearing 3 L of O2 at night but denied wearing oxygen during the day.  It was recommended that he be  admitted for close observation, but patient refused.  Patient stated he was feeling better and stated he would use his oxygen at home as needed.  05/22/2022 - Saw him for follow-up evaluation. Difficult historian and presented with ex-wife. Admitted to occasional left sided chest pain, described as sharp, non-radiating and NG helps, intermittent in nature, chronic and ongoing since heart attack in 2015. Still smoking, was trying to quit. Imdur was increased to 15 mg daily. ASA was stopped.   07/24/2022 - Here for follow-up.  States he is doing well.  He states his increased dose of Imdur helped his symptoms some, however says he still has similar chest pain from last office visit.  No other acute changes to his health since I saw him last.  Breathing is stable. Denies any palpitations, syncope, presyncope, dizziness, orthopnea, PND, swelling or significant weight changes, acute bleeding, or claudication.  Past Medical History:  Diagnosis Date   Anxiety    Asthma    Atrial flutter [place] 04/2021   Chronic diastolic CHF (congestive heart failure) (HCC)    Chronic lower back pain    COPD (chronic obstructive pulmonary disease) (HCC)    Coronary artery disease    a. [facility]   Depression    GERD (gastroesophageal reflux disease)    High cholesterol    History of  tracheostomy    Hypertension    NSTEMI (non-ST elevated myocardial infarction) ([place]) 05/2014   with stent placement   PAF (paroxysmal atrial fibrillation) ([place])    Sleep apnea    Stroke ([place]) 2017   anyeusym    TIA (transient ischemic attack)    "they say I've had some mini strokes; don't know when"; denies residual on 06/22/2014)   Tobacco abuse    Type II diabetes mellitus ([place])    Ulcerative colitis ([place])     Past Surgical History:  Procedure Laterality Date   APPENDECTOMY     BIOPSY  07/20/2020   Procedure: BIOPSY;  Surgeon: Harvel Quale, MD;  Location[facility];  Service:  Gastroenterology;;   CARDIAC CATHETERIZATION  [phone] X 3   CHOLECYSTECTOMY     COLONOSCOPY WITH PROPOFOL N/A 07/20/2020   Procedure: COLONOSCOPY WITH PROPOFOL;  Surgeon: Harvel Quale, MD;  Location: [facility];  Service: Gastroenterology;  Laterality: N/A;  1:15   CORONARY ANGIOPLASTY WITH STENT PLACEMENT  05/2014   "2"   ESOPHAGEAL DILATION N/A 07/20/2020   Procedure: ESOPHAGEAL DILATION;  Surgeon: Harvel Quale, MD;  Location: [facility];  Service: Gastroenterology;  Laterality: N/A;   ESOPHAGOGASTRODUODENOSCOPY (EGD) WITH PROPOFOL N/A 07/20/2020   Procedure: ESOPHAGOGASTRODUODENOSCOPY (EGD) WITH PROPOFOL;  Surgeon: Harvel Quale, MD;  Location: [facility];  Service: Gastroenterology;  Laterality: N/A;   IR GASTROSTOMY TUBE MOD SED  06/04/2021   IR GASTROSTOMY TUBE REMOVAL  07/24/2021   LEFT HEART CATH AND CORONARY ANGIOGRAPHY N/A 05/25/2020   Procedure: LEFT HEART CATH AND CORONARY ANGIOGRAPHY;  Surgeon: Martinique, Peter M, MD;  Location: [facility];  Service: Cardiovascular;  Laterality: N/A;   POLYPECTOMY  07/20/2020   Procedure: POLYPECTOMY INTESTINAL;  Surgeon: Montez Morita, Quillian Quince, MD;  Location: [facility];  Service: Gastroenterology;;   TUMOR EXCISION Right ~ 1999   "side of my upper head"   Current Meds  Medication Sig   acetaminophen (TYLENOL) 325 MG tablet Take 2 tablets (650 mg total) by mouth every 6 (six) hours as needed for mild pain (or Fever >/= 101).   albuterol (VENTOLIN HFA) 108 (90 Base) MCG/ACT inhaler Inhale 2 puffs into the lungs every 6 (six) hours as needed for wheezing or shortness of breath.   ALPRAZolam (XANAX) 0.25 MG tablet Take 1 tablet (0.25 mg total) by mouth 2 (two) times daily as needed for anxiety.   amiodarone (PACERONE) 200 MG tablet Take 1 tablet by mouth once daily   bisoprolol (ZEBETA) 5 MG tablet Take 1 tablet (5 mg total) by mouth daily. In Place of Metoprolol   budesonide (PULMICORT) 0.5  MG/2ML nebulizer solution Take 2 mLs (0.5 mg total) by nebulization 2 (two) times daily.   buPROPion (WELLBUTRIN XL) 150 MG 24 hr tablet Take 1 tablet (150 mg total) by mouth daily.   busPIRone (BUSPAR) 7.5 MG tablet Take 1 tablet (7.5 mg total) by mouth 3 (three) times daily as needed.   ELIQUIS 5 MG TABS tablet Take 1 tablet by mouth twice daily   empagliflozin (JARDIANCE) 25 MG TABS tablet Take 1 tablet (25 mg total) by mouth daily before breakfast.   Fluticasone-Umeclidin-Vilant (TRELEGY ELLIPTA) 200-62.5-25 MCG/ACT AEPB Inhale into the lungs.   furosemide (LASIX) 40 MG tablet Take 1 tablet by mouth twice daily   glucose blood (ONE TOUCH ULTRA TEST) test strip USE TO CHECK GLUCOSE ONCE DAILY   ipratropium-albuterol (DUONEB) 0.5-2.5 (3) MG/3ML SOLN Take 3 mLs by  nebulization every 6 (six) hours as needed (asthma).   KLOR-CON 20 MEQ packet DISSOLVE 1 PACKET IN WATER & DRINK TWICE DAILY   nitroGLYCERIN (NITROSTAT) 0.4 MG SL tablet Place 1 tablet (0.4 mg total) under the tongue every 5 (five) minutes as needed for chest pain.   omeprazole (PRILOSEC) 20 MG capsule Take 1 capsule (20 mg total) by mouth daily.   OXYGEN Inhale 3 L into the lungs at bedtime as needed (shortness of breath).   polyethylene glycol (MIRALAX / GLYCOLAX) 17 g packet Take 17 g by mouth daily.   potassium chloride SA (KLOR-CON M20) 20 MEQ tablet Take 1 tablet by mouth twice daily   triamcinolone ointment (KENALOG) 0.5 % Apply 1 Application topically 2 (two) times daily.   isosorbide mononitrate (IMDUR) 30 MG 24 hr tablet Take 0.5 tablets (15 mg total) by mouth daily.   Allergies:   Gabapentin and Metformin and related   Social History   Socioeconomic History   Marital status: Significant Other    Spouse name: Ronald [name]   Number of children: 1   Years of education: Not on file   Highest education level: Not on file  Occupational History   Occupation: Retired    Comment: Aeronautical engineer  Tobacco Use   Smoking status: Every  Day    Packs/day: 0.50    Years: 48.00    Total pack years: 24.00    Types: Cigarettes    Start date: 02/12/1966    Last attempt to quit: 07/03/2021    Years since quitting: 1.0   Smokeless tobacco: Never   Tobacco comments:    smokes 1 ppd  05/20/22  Vaping Use   Vaping Use: Never used  Substance and Sexual Activity   Alcohol use: Not Currently    Alcohol/week: 0.0 standard drinks of alcohol   Drug use: No   Sexual activity: Never  Other Topics Concern   Not on file  Social History Narrative   Staying with his ex-wife, Ronald [name]   Has 2 children (today he told me one child 01/15/21)   Social Determinants of Health   Financial Resource Strain: Low Risk  (05/20/2022)   Overall Financial Resource Strain (CARDIA)    Difficulty of Paying Living Expenses: Not very hard  Food Insecurity: No Food Insecurity (05/20/2022)   Hunger Vital Sign    Worried About Running Out of Food in the Last Year: Never true    [place] in the Last Year: Never true  Transportation Needs: No Transportation Needs (05/20/2022)   PRAPARE - Hydrologist (Medical): No    Lack of Transportation (Non-Medical): No  Physical Activity: Inactive (05/20/2022)   Exercise Vital Sign    Days of Exercise per Week: 0 days    Minutes of Exercise per Session: 0 min  Stress: No Stress Concern Present (05/20/2022)   [place]    Feeling of Stress : Only a little  Social Connections: Moderately Integrated (05/20/2022)   Social Connection and Isolation Panel [NHANES]    Frequency of Communication with Friends and Family: Twice a week    Frequency of Social Gatherings with Friends and Family: Once a week    Attends Religious Services: Never    Marine scientist or Organizations: Yes    Attends Archivist Meetings: Never    Marital Status: Living with partner     Family History: The patient's family history  includes CAD  in his father; Cancer in his brother and brother; Dementia in his sister; Emphysema in his sister; Leukemia in his sister; Lung cancer in his brother; Stroke in his mother.  ROS:   Review of Systems  Constitutional: Negative.   HENT: Negative.    Eyes: Negative.   Respiratory:  Positive for shortness of breath. Negative for cough, hemoptysis, sputum production and wheezing.        See HPI.   Cardiovascular:  Positive for chest pain. Negative for palpitations, orthopnea, claudication, leg swelling and PND.       See HPI.   Gastrointestinal: Negative.   Genitourinary: Negative.   Musculoskeletal: Negative.   Skin: Negative.   Neurological: Negative.   Endo/Heme/Allergies: Negative.   Psychiatric/Behavioral: Negative.      Please see the history of present illness.    All other systems reviewed and are negative.  EKGs/Labs/Other Studies Reviewed:    The following studies were reviewed today:   EKG:  EKG is not ordered today.    2D echocardiogram on September 01, 2021:  1. Left ventricular ejection fraction, by estimation, is 55 to 60%. The  left ventricle has normal function. The left ventricle demonstrates  regional wall motion abnormalities (see scoring diagram/findings for  description). There is mild left ventricular   hypertrophy. Left ventricular diastolic parameters are consistent with  Grade I diastolic dysfunction (impaired relaxation). Elevated left  ventricular end-diastolic pressure. The E/e' is 20. There is moderate  hypokinesis of the left ventricular, basal  inferoseptal wall, inferolateral wall and septal wall.   2. Right ventricular systolic function is hyperdynamic. The right  ventricular size is normal.   3. Left atrial size was mildly dilated.   4. The mitral valve is abnormal. Trivial mitral valve regurgitation.   5. The aortic valve is tricuspid. Aortic valve regurgitation is not  visualized.   6. The inferior vena cava is normal in size with  greater than 50%  respiratory variability, suggesting right atrial pressure of 3 mmHg.   7. Cannot exclude a small PFO.   Comparison(s): Changes from prior study are noted. 05/07/2021: LVEF  50-55%, no regional wall motion abnormalities.  Left heart cath and coronary angiography on May 25, 2020: Previously placed Mid LAD stent (unknown type) is widely patent. Mid LAD to Dist LAD lesion is 50% stenosed. Dist LAD lesion is 50% stenosed. Prox RCA lesion is 60% stenosed. Mid RCA lesion is 50% stenosed. RV Branch lesion is 80% stenosed. LV end diastolic pressure is mildly elevated.   1. Modest 2 vessel CAD. The stent in the LAD is widely patent.     50% stenosis in the mid and distal LAD    60% proximal and 50% mid RCA. 80% diffuse disease in the RV marginal branch 2. LVEDP 16 mm Hg   Plan: would recommend medical therapy. No culprit lesion for his chest pain identified.   Recent Labs: 11/23/2021: Magnesium 2.3 01/24/2022: ALT 8 03/17/2022: B Natriuretic Peptide 375.0; BUN 12; Creatinine, Ser 1.55; Hemoglobin 12.4; Platelets 378; Potassium 2.9; Sodium 142  Recent Lipid Panel    Component Value Date/Time   CHOL 156 08/14/2021 1531   TRIG 157 (H) 08/14/2021 1531   HDL 48 08/14/2021 1531   CHOLHDL 3.3 08/14/2021 1531   CHOLHDL 3.8 05/25/2020 0453   VLDL 29 05/25/2020 0453   LDLCALC 81 08/14/2021 1531    Physical Exam:    VS:  BP (!) 146/56   Pulse (!) 57   Wt 173 lb 9.6 oz (  78.7 kg)   SpO2 96%   BMI 25.64 kg/m     Wt Readings from Last 3 Encounters:  07/24/22 173 lb 9.6 oz (78.7 kg)  07/07/22 171 lb (77.6 kg)  05/26/22 170 lb 9.6 oz (77.4 kg)     GEN: Well nourished, well developed in no acute distress HEENT: Normal NECK: No JVD; No carotid bruits CARDIAC: S1/S2, RRR, no murmurs, rubs, gallops; 2+ peripheral pulses throughout, strong and equal bilaterally RESPIRATORY:  Clear and diminished to auscultation without rales, wheezing or rhonchi  MUSCULOSKELETAL:  No  lower extremity edema, minimal, nonpitting edema along LUE >RUE (chronic per his report); No deformity  SKIN: Warm and dry NEUROLOGIC:  Alert and oriented x 3 PSYCHIATRIC:  Normal affect   ASSESSMENT:    1. Coronary artery disease involving native coronary artery of native heart with other form of angina pectoris ([place])   2. PAF (paroxysmal atrial fibrillation) ([place])   3. Mixed hyperlipidemia   4. Essential hypertension, benign   5. Chronic obstructive pulmonary disease, unspecified COPD type ([place])   6. Tobacco abuse   7. Chronic renal failure, stage 3a ([place])   8. Elevated serum creatinine    PLAN:    In order of problems listed above:  CAD, s/p DES to LAD x 2, s/p NSTEMI in 2015 Does admit to chronic chest pain since 2015. Intermittent in nature and improved with NG and since increasing Imdur at last OV. Cardiac cath in 2021 did not indicate intervention, medically managed. Will increase to Imdur 30 mg daily. Continue  bisoprolol, losartan, NG, and Crestor. Heart healthy diet and regular cardiovascular exercise encouraged. ED precautions discussed.   2. Paroxysmal A-fib Denies any tachycardia or palpitations.  During hospital stay 07/2021 it was found that he was in wide-complex irregular tachycardia in the ED felt to be A-fib with RVR.  CHA2DS2-VASc score 6.  Continue Eliquis 5 mg twice daily.  Currently on appropriate dosage. Continue bisoprolol. Heart healthy diet and regular cardiovascular exercise encouraged.    3. Mixed HLD Lipid profile in March 2023 revealed total cholesterol 156, HDL 48, triglycerides 157, and LDL 81.  Continue rosuvastatin. Heart healthy diet and regular cardiovascular exercise encouraged.   4. HTN BP elevated today. BP overall well-controlled at home.  Continue current medication regimen.  Discussed to monitor BP at home at least 2 hours after medications and sitting for 5-10 minutes. Heart healthy diet and regular cardiovascular exercise encouraged.   5.  COPD Denies any worsening shortness of breath or acute exacerbations. Continue to follow-up with pulmonology.  Continue current medication regimen.  6. Tobacco abuse Smoking cessation encouraged and discussed.  7. Chronic kidney disease stage 3a, elevated serum creatinine Baseline Creatinine is around 1.7. Most recent sCr was 2.12 with eGFR at 32. Will obtain repeat BMET. Continue to follow with PCP.  8.  Disposition:  Follow-up with Dr. Carlyle Dolly in 6 months or sooner if anything changes.    Medication Adjustments/Labs and Tests Ordered: Current medicines are reviewed at length with the patient today.  Concerns regarding medicines are outlined above.  Orders Placed This Encounter  Procedures   Basic metabolic panel   Meds ordered this encounter  Medications   isosorbide mononitrate (IMDUR) 30 MG 24 hr tablet    Sig: Take 1 tablet (30 mg total) by mouth daily.    Dispense:  90 tablet    Refill:  3    Dose increase    Patient Instructions  Medication Instructions:  INCREASE isosorbide (  Imdur) to 30 mg daily  *If you need a refill on your cardiac medications before your next appointment, please call your pharmacy*  Lab Work: BMET in 2 weeks  If you have labs (blood work) drawn today and your tests are completely normal, you will receive your results only by: [place] (if you have MyChart) OR A paper copy in the mail If you have any lab test that is abnormal or we need to change your treatment, we will call you to review the results.  Follow-Up: At [facility], you and your health needs are our priority.  As part of our continuing mission to provide you with exceptional heart care, we have created designated Provider Care Teams.  These Care Teams include your primary Cardiologist (physician) and Advanced Practice Providers (APPs -  Physician Assistants and Nurse Practitioners) who all work together to provide you with the care you need, when you need  it.  We recommend signing up for the patient portal called "MyChart".  Sign up information is provided on this After Visit Summary.  MyChart is used to connect with patients for Virtual Visits (Telemedicine).  Patients are able to view lab/test results, encounter notes, upcoming appointments, etc.  Non-urgent messages can be sent to your provider as well.   To learn more about what you can do with MyChart, go to [email or web address].    Your next appointment:   6 month(s)  Provider:   Carlyle Dolly, MD        Signed, Finis Bud, NP  07/26/2022 7:34 PM    [place]

## 2022-07-24 NOTE — Patient Instructions (Signed)
Medication Instructions:  INCREASE isosorbide (Imdur) to 30 mg daily  *If you need a refill on your cardiac medications before your next appointment, please call your pharmacy*  Lab Work: BMET in 2 weeks  If you have labs (blood work) drawn today and your tests are completely normal, you will receive your results only by: [place] (if you have MyChart) OR A paper copy in the mail If you have any lab test that is abnormal or we need to change your treatment, we will call you to review the results.  Follow-Up: At [facility], you and your health needs are our priority.  As part of our continuing mission to provide you with exceptional heart care, we have created designated Provider Care Teams.  These Care Teams include your primary Cardiologist (physician) and Advanced Practice Providers (APPs -  Physician Assistants and Nurse Practitioners) who all work together to provide you with the care you need, when you need it.  We recommend signing up for the patient portal called "MyChart".  Sign up information is provided on this After Visit Summary.  MyChart is used to connect with patients for Virtual Visits (Telemedicine).  Patients are able to view lab/test results, encounter notes, upcoming appointments, etc.  Non-urgent messages can be sent to your provider as well.   To learn more about what you can do with MyChart, go to [email or web address].    Your next appointment:   6 month(s)  Provider:   Carlyle Dolly, MD

## 2022-07-28 ENCOUNTER — Other Ambulatory Visit: Payer: Self-pay | Admitting: Family

## 2022-08-04 DIAGNOSIS — I5030 Unspecified diastolic (congestive) heart failure: Secondary | ICD-10-CM | POA: Diagnosis not present

## 2022-08-06 ENCOUNTER — Ambulatory Visit (INDEPENDENT_AMBULATORY_CARE_PROVIDER_SITE_OTHER): Payer: Medicare HMO | Admitting: *Deleted

## 2022-08-06 DIAGNOSIS — E1142 Type 2 diabetes mellitus with diabetic polyneuropathy: Secondary | ICD-10-CM

## 2022-08-06 DIAGNOSIS — J441 Chronic obstructive pulmonary disease with (acute) exacerbation: Secondary | ICD-10-CM

## 2022-08-06 DIAGNOSIS — J439 Emphysema, unspecified: Secondary | ICD-10-CM | POA: Diagnosis not present

## 2022-08-06 NOTE — Chronic Care Management (AMB) (Signed)
Chronic Care Management   CCM RN Visit Note  08/06/2022 Name: Ronald [name] MRN: [id number] DOB: [birth year]  Subjective: Ronald [name] is a 77 y.o. year old male who is a primary care patient of [name], FNP. The patient was referred to the Chronic Care Management team for assistance with care management needs subsequent to provider initiation of CCM services and plan of care.    Today's Visit:  Engaged with patient by telephone for follow up visit.        Goals Addressed             This Visit's Progress    CCM (COPD) EXPECTED OUTCOME: MONITOR, SELF-MANAGE AND REDUCE SYMPTOMS OF COPD       Current Barriers:  Knowledge Deficits related to COPD management Chronic Disease Management support and education needs related to COPD, medications Literacy barriers Non-adherence to prescribed medication regimen No Advanced Directives in place- pt declines Patient reports he uses oxygen at 3 liters at hs as needed Per daughter in law [name], pt is set in his ways and not been willing to change, family has tried and it does no good, not worth the hard feelings it has caused in the past Spoke with patient's ex- wife [name] (lives with pt) who reports pt has cut down to 1/3 ppd cigarettes and is trying " to do better", pt saw pulmonologist on 07/07/22, [name] states pt is on his last Trelegy inhaler and she is going to call to doctor's office today and ask for samples  Planned Interventions: Advised patient to track and manage COPD triggers Advised patient to self assesses COPD action plan zone and make appointment with provider if in the yellow zone for 48 hours without improvement Advised patient to engage in light exercise as tolerated 3-5 days a week to aid in the the management of COPD Provided education about and advised patient to utilize infection prevention strategies to reduce risk of respiratory infection Discussed the importance of adequate rest and management of fatigue with  COPD Reinforced COPD action plan with emphasis on yellow zone Reinforced importance of breathing exercises, relaxation in management of COPD Reinforced importance of smoking cessation Reinforced- cannot smoke around oxygen Encouraged [name]/ pt to call primary care provider office ahead of time when needing samples for any medications/ inhalers  Symptom Management: Take medications as prescribed   Attend all scheduled provider appointments Call pharmacy for medication refills 3-7 days in advance of running out of medications Attend church or other social activities Perform all self care activities independently  Call provider office for new concerns or questions  eliminate smoking in my home identify and remove indoor air pollutants limit outdoor activity during cold weather listen for public air quality announcements every day do breathing exercises every day develop a rescue plan eliminate symptom triggers at home follow rescue plan if symptoms flare-up eat healthy/prescribed diet: heart healthy, low sodium get at least 7 to 8 hours of sleep at night practice relaxation or meditation daily do breathing exercises at least 2 times each day do exercises in a comfortable position that makes breathing as easy as possible Do not smoke around oxygen Follow COPD action plan- call your doctor early on for change in health status, symptoms Avoid sick people, wear a mask as needed, practice good handwashing Call your doctor's office for medication samples before you run out  Follow Up Plan: Telephone follow up appointment with care management team member scheduled for:   10/07/22 at 215  pm       CCM (DIABETES) EXPECTED OUTCOME:  MONITOR, SELF-MANAGE AND REDUCE SYMPTOMS OF DIABETES       Current Barriers:  Knowledge Deficits related to Diabetes management Chronic Disease Management support and education needs related to Diabetes and diet Literacy barriers Non-adherence to prescribed  medication regimen No Advanced Directives in place- pt declines information Spoke with patient and ex-wife [name] (per pt request), reports independent with ADL's, cannot read or write and ex-wife and daughter in [employer], physiological (nurse) provide some assistance, [name] oversees medications and [name] reports pt does not let [name] know when he is out of as needed medications such as albuterol inhaler or xanax.   Patient states he does not have diabetes, has glucometer but does not check CBG and states , [name] reports pt is not willing to do any of this and does not acknowledge that he has diabetes [name] (daughter in law)  tries to provide oversight with medications but pt will not let her know when he is out of prn medications.   Pt is unable to use a telephone and cannot call in his own refills and [name] will not be responsible for this. Spoke with [name] 08/06/22, she states pt does have all medications and taking as prescribed, states [name] is still providing oversight, pt is still not willing to have blood sugar checked, [name] states pt has "cut down on sugar"  Planned Interventions: Reviewed medications with patient and discussed importance of medication adherence;        Counseled on importance of regular laboratory monitoring as prescribed;        Reviewed scheduled/upcoming provider appointments including: primary care provider 08/28/22 at 210 pm;         call provider for findings outside established parameters;       Reinforced carbohydrate modified diet and food choices  Symptom Management: Take medications as prescribed   Attend all scheduled provider appointments Call pharmacy for medication refills 3-7 days in advance of running out of medications Attend church or other social activities Perform all self care activities independently  Perform IADL's (shopping, preparing meals, housekeeping, managing finances) independently Call provider office for new concerns or questions  check blood sugar at  prescribed times: when you have symptoms of low or high blood sugar and per doctor order  check feet daily for cuts, sores or redness enter blood sugar readings and medication or insulin into daily log take the blood sugar log to all doctor visits take the blood sugar meter to all doctor visits fill half of plate with vegetables limit fast food meals to no more than 1 per week manage portion size prepare main meal at home 3 to 5 days each week read food labels for fat, fiber, carbohydrates and portion size set a realistic goal keep feet up while sitting wash and dry feet carefully every day wear comfortable, cotton socks wear comfortable, well-fitting shoes Call in advance to primary care provider for any medication/ inhalers samples you may need Let your family know when refills are needed so you don't run out of any medication  Follow Up Plan: Telephone follow up appointment with care management team member scheduled for:  10/07/22 at 215 pm          Plan:Telephone follow up appointment with care management team member scheduled for:  10/07/22 at 215 pm  Jacqlyn Larsen [facility], BSN RN Case Manager [place] [phone]

## 2022-08-06 NOTE — Patient Instructions (Addendum)
Please call the care guide team at [phone] if you need to cancel or reschedule your appointment.   If you are experiencing a Mental Health or [place] or need someone to talk to, please call the Suicide and Crisis Lifeline: 988 call the [place] National Suicide Prevention Lifeline: 952-307-5987 or TTY: 938-600-5861 TTY 305 263 7672) to talk to a trained counselor call 1-800-273-TALK (toll free, 24 hour hotline) go to [facility] [address] [place] [phone]) call the [facility]: [phone] call 911   Following is a copy of the CCM Program Consent:  CCM service includes personalized support from designated clinical staff supervised by the physician, including individualized plan of care and coordination with other care providers 24/7 contact phone numbers for assistance for urgent and routine care needs. Service will only be billed when office clinical staff spend 20 minutes or more in a month to coordinate care. Only one practitioner may furnish and bill the service in a calendar month. The patient may stop CCM services at amy time (effective at the end of the month) by phone call to the office staff. The patient will be responsible for cost sharing (co-pay) or up to 20% of the service fee (after annual deductible is met)  Following is a copy of your full provider care plan:   Goals Addressed             This Visit's Progress    CCM (COPD) EXPECTED OUTCOME: MONITOR, SELF-MANAGE AND REDUCE SYMPTOMS OF COPD       Current Barriers:  Knowledge Deficits related to COPD management Chronic Disease Management support and education needs related to COPD, medications Literacy barriers Non-adherence to prescribed medication regimen No Advanced Directives in place- pt declines Patient reports he uses oxygen at 3 liters at hs as needed Per daughter in law [name], pt is set in his ways and not been willing to  change, family has tried and it does no good, not worth the hard feelings it has caused in the past Spoke with patient's ex- wife [name] (lives with pt) who reports pt has cut down to 1/3 ppd cigarettes and is trying " to do better", pt saw pulmonologist on 07/07/22, [name] states pt is on his last Trelegy inhaler and she is going to call to doctor's office today and ask for samples  Planned Interventions: Advised patient to track and manage COPD triggers Advised patient to self assesses COPD action plan zone and make appointment with provider if in the yellow zone for 48 hours without improvement Advised patient to engage in light exercise as tolerated 3-5 days a week to aid in the the management of COPD Provided education about and advised patient to utilize infection prevention strategies to reduce risk of respiratory infection Discussed the importance of adequate rest and management of fatigue with COPD Reinforced COPD action plan with emphasis on yellow zone Reinforced importance of breathing exercises, relaxation in management of COPD Reinforced importance of smoking cessation Reinforced- cannot smoke around oxygen Encouraged [name]/ pt to call primary care provider office ahead of time when needing samples for any medications/ inhalers  Symptom Management: Take medications as prescribed   Attend all scheduled provider appointments Call pharmacy for medication refills 3-7 days in advance of running out of medications Attend church or other social activities Perform all self care activities independently  Call provider office for new concerns or questions  eliminate smoking in my home identify and remove indoor air pollutants limit  outdoor activity during cold weather listen for public air quality announcements every day do breathing exercises every day develop a rescue plan eliminate symptom triggers at home follow rescue plan if symptoms flare-up eat healthy/prescribed diet: heart  healthy, low sodium get at least 7 to 8 hours of sleep at night practice relaxation or meditation daily do breathing exercises at least 2 times each day do exercises in a comfortable position that makes breathing as easy as possible Do not smoke around oxygen Follow COPD action plan- call your doctor early on for change in health status, symptoms Avoid sick people, wear a mask as needed, practice good handwashing Call your doctor's office for medication samples before you run out  Follow Up Plan: Telephone follow up appointment with care management team member scheduled for:   10/07/22 at 215 pm       CCM (DIABETES) EXPECTED OUTCOME:  MONITOR, SELF-MANAGE AND REDUCE SYMPTOMS OF DIABETES       Current Barriers:  Knowledge Deficits related to Diabetes management Chronic Disease Management support and education needs related to Diabetes and diet Literacy barriers Non-adherence to prescribed medication regimen No Advanced Directives in place- pt declines information Spoke with patient and ex-wife [name] (per pt request), reports independent with ADL's, cannot read or write and ex-wife and daughter in [employer], physiological (nurse) provide some assistance, [name] oversees medications and [name] reports pt does not let [name] know when he is out of as needed medications such as albuterol inhaler or xanax.   Patient states he does not have diabetes, has glucometer but does not check CBG and states , [name] reports pt is not willing to do any of this and does not acknowledge that he has diabetes [name] (daughter in law)  tries to provide oversight with medications but pt will not let her know when he is out of prn medications.   Pt is unable to use a telephone and cannot call in his own refills and [name] will not be responsible for this. Spoke with [name] 08/06/22, she states pt does have all medications and taking as prescribed, states [name] is still providing oversight, pt is still not willing to have blood sugar  checked, [name] states pt has "cut down on sugar"  Planned Interventions: Reviewed medications with patient and discussed importance of medication adherence;        Counseled on importance of regular laboratory monitoring as prescribed;        Reviewed scheduled/upcoming provider appointments including: primary care provider 08/28/22 at 210 pm;         call provider for findings outside established parameters;       Reinforced carbohydrate modified diet and food choices  Symptom Management: Take medications as prescribed   Attend all scheduled provider appointments Call pharmacy for medication refills 3-7 days in advance of running out of medications Attend church or other social activities Perform all self care activities independently  Perform IADL's (shopping, preparing meals, housekeeping, managing finances) independently Call provider office for new concerns or questions  check blood sugar at prescribed times: when you have symptoms of low or high blood sugar and per doctor order  check feet daily for cuts, sores or redness enter blood sugar readings and medication or insulin into daily log take the blood sugar log to all doctor visits take the blood sugar meter to all doctor visits fill half of plate with vegetables limit fast food meals to no more than 1 per week manage portion size prepare main meal at  home 3 to 5 days each week read food labels for fat, fiber, carbohydrates and portion size set a realistic goal keep feet up while sitting wash and dry feet carefully every day wear comfortable, cotton socks wear comfortable, well-fitting shoes Call in advance to primary care provider for any medication/ inhalers samples you may need Let your family know when refills are needed so you don't run out of any medication  Follow Up Plan: Telephone follow up appointment with care management team member scheduled for:  10/07/22 at 215 pm          Patient verbalizes understanding  of instructions and care plan provided today and agrees to view in [place]. Active MyChart status and patient understanding of how to access instructions and care plan via MyChart confirmed with patient.     Telephone follow up appointment with care management team member scheduled for:  10/07/22 at 215 pm

## 2022-08-07 ENCOUNTER — Other Ambulatory Visit: Payer: Self-pay | Admitting: Family

## 2022-08-07 DIAGNOSIS — J449 Chronic obstructive pulmonary disease, unspecified: Secondary | ICD-10-CM | POA: Diagnosis not present

## 2022-08-07 DIAGNOSIS — R69 Illness, unspecified: Secondary | ICD-10-CM | POA: Diagnosis not present

## 2022-08-07 DIAGNOSIS — E1159 Type 2 diabetes mellitus with other circulatory complications: Secondary | ICD-10-CM | POA: Diagnosis not present

## 2022-08-07 DIAGNOSIS — F17210 Nicotine dependence, cigarettes, uncomplicated: Secondary | ICD-10-CM | POA: Diagnosis not present

## 2022-08-08 ENCOUNTER — Telehealth: Payer: Self-pay | Admitting: Family

## 2022-08-08 NOTE — Telephone Encounter (Signed)
Samples placed up front and patient has picked up

## 2022-08-26 DIAGNOSIS — J9611 Chronic respiratory failure with hypoxia: Secondary | ICD-10-CM | POA: Diagnosis not present

## 2022-08-26 DIAGNOSIS — I5032 Chronic diastolic (congestive) heart failure: Secondary | ICD-10-CM | POA: Diagnosis not present

## 2022-08-26 DIAGNOSIS — Z515 Encounter for palliative care: Secondary | ICD-10-CM | POA: Diagnosis not present

## 2022-08-26 DIAGNOSIS — J9612 Chronic respiratory failure with hypercapnia: Secondary | ICD-10-CM | POA: Diagnosis not present

## 2022-08-26 DIAGNOSIS — J449 Chronic obstructive pulmonary disease, unspecified: Secondary | ICD-10-CM | POA: Diagnosis not present

## 2022-08-28 ENCOUNTER — Ambulatory Visit: Payer: Medicare HMO | Admitting: Family

## 2022-09-02 DIAGNOSIS — I5030 Unspecified diastolic (congestive) heart failure: Secondary | ICD-10-CM | POA: Diagnosis not present

## 2022-09-04 DIAGNOSIS — J439 Emphysema, unspecified: Secondary | ICD-10-CM | POA: Diagnosis not present

## 2022-09-09 DIAGNOSIS — J9811 Atelectasis: Secondary | ICD-10-CM | POA: Diagnosis not present

## 2022-09-09 DIAGNOSIS — R0602 Shortness of breath: Secondary | ICD-10-CM | POA: Diagnosis not present

## 2022-09-09 DIAGNOSIS — R030 Elevated blood-pressure reading, without diagnosis of hypertension: Secondary | ICD-10-CM | POA: Diagnosis not present

## 2022-09-09 DIAGNOSIS — R079 Chest pain, unspecified: Secondary | ICD-10-CM | POA: Diagnosis not present

## 2022-09-09 DIAGNOSIS — R072 Precordial pain: Secondary | ICD-10-CM | POA: Diagnosis not present

## 2022-09-09 DIAGNOSIS — J441 Chronic obstructive pulmonary disease with (acute) exacerbation: Secondary | ICD-10-CM | POA: Diagnosis not present

## 2022-09-09 DIAGNOSIS — R0789 Other chest pain: Secondary | ICD-10-CM | POA: Diagnosis not present

## 2022-09-09 DIAGNOSIS — R7989 Other specified abnormal findings of blood chemistry: Secondary | ICD-10-CM | POA: Diagnosis not present

## 2022-09-09 DIAGNOSIS — R778 Other specified abnormalities of plasma proteins: Secondary | ICD-10-CM | POA: Diagnosis not present

## 2022-09-09 DIAGNOSIS — Z743 Need for continuous supervision: Secondary | ICD-10-CM | POA: Diagnosis not present

## 2022-09-10 DIAGNOSIS — I10 Essential (primary) hypertension: Secondary | ICD-10-CM | POA: Diagnosis not present

## 2022-09-10 DIAGNOSIS — N179 Acute kidney failure, unspecified: Secondary | ICD-10-CM | POA: Diagnosis not present

## 2022-09-10 DIAGNOSIS — J439 Emphysema, unspecified: Secondary | ICD-10-CM | POA: Diagnosis not present

## 2022-09-10 DIAGNOSIS — I25118 Atherosclerotic heart disease of native coronary artery with other forms of angina pectoris: Secondary | ICD-10-CM | POA: Diagnosis not present

## 2022-09-10 DIAGNOSIS — F17210 Nicotine dependence, cigarettes, uncomplicated: Secondary | ICD-10-CM | POA: Diagnosis not present

## 2022-09-10 DIAGNOSIS — D72829 Elevated white blood cell count, unspecified: Secondary | ICD-10-CM | POA: Diagnosis not present

## 2022-09-10 DIAGNOSIS — E785 Hyperlipidemia, unspecified: Secondary | ICD-10-CM | POA: Diagnosis not present

## 2022-09-10 DIAGNOSIS — K219 Gastro-esophageal reflux disease without esophagitis: Secondary | ICD-10-CM | POA: Diagnosis not present

## 2022-09-10 DIAGNOSIS — R791 Abnormal coagulation profile: Secondary | ICD-10-CM | POA: Diagnosis not present

## 2022-09-10 DIAGNOSIS — Z7982 Long term (current) use of aspirin: Secondary | ICD-10-CM | POA: Diagnosis not present

## 2022-09-10 DIAGNOSIS — I071 Rheumatic tricuspid insufficiency: Secondary | ICD-10-CM | POA: Diagnosis not present

## 2022-09-10 DIAGNOSIS — R0602 Shortness of breath: Secondary | ICD-10-CM | POA: Diagnosis not present

## 2022-09-10 DIAGNOSIS — Z7984 Long term (current) use of oral hypoglycemic drugs: Secondary | ICD-10-CM | POA: Diagnosis not present

## 2022-09-10 DIAGNOSIS — I5032 Chronic diastolic (congestive) heart failure: Secondary | ICD-10-CM | POA: Diagnosis not present

## 2022-09-10 DIAGNOSIS — J9811 Atelectasis: Secondary | ICD-10-CM | POA: Diagnosis not present

## 2022-09-10 DIAGNOSIS — R079 Chest pain, unspecified: Secondary | ICD-10-CM | POA: Diagnosis not present

## 2022-09-10 DIAGNOSIS — J441 Chronic obstructive pulmonary disease with (acute) exacerbation: Secondary | ICD-10-CM | POA: Diagnosis not present

## 2022-09-10 DIAGNOSIS — I5030 Unspecified diastolic (congestive) heart failure: Secondary | ICD-10-CM | POA: Diagnosis not present

## 2022-09-10 DIAGNOSIS — Z792 Long term (current) use of antibiotics: Secondary | ICD-10-CM | POA: Diagnosis not present

## 2022-09-10 DIAGNOSIS — Z7951 Long term (current) use of inhaled steroids: Secondary | ICD-10-CM | POA: Diagnosis not present

## 2022-09-10 DIAGNOSIS — I517 Cardiomegaly: Secondary | ICD-10-CM | POA: Diagnosis not present

## 2022-09-10 DIAGNOSIS — J9610 Chronic respiratory failure, unspecified whether with hypoxia or hypercapnia: Secondary | ICD-10-CM | POA: Diagnosis not present

## 2022-09-10 DIAGNOSIS — I2510 Atherosclerotic heart disease of native coronary artery without angina pectoris: Secondary | ICD-10-CM | POA: Diagnosis not present

## 2022-09-10 DIAGNOSIS — Z955 Presence of coronary angioplasty implant and graft: Secondary | ICD-10-CM | POA: Diagnosis not present

## 2022-09-10 DIAGNOSIS — I130 Hypertensive heart and chronic kidney disease with heart failure and stage 1 through stage 4 chronic kidney disease, or unspecified chronic kidney disease: Secondary | ICD-10-CM | POA: Diagnosis not present

## 2022-09-10 DIAGNOSIS — I480 Paroxysmal atrial fibrillation: Secondary | ICD-10-CM | POA: Diagnosis not present

## 2022-09-10 DIAGNOSIS — Z7901 Long term (current) use of anticoagulants: Secondary | ICD-10-CM | POA: Diagnosis not present

## 2022-09-10 DIAGNOSIS — E1122 Type 2 diabetes mellitus with diabetic chronic kidney disease: Secondary | ICD-10-CM | POA: Diagnosis not present

## 2022-09-10 DIAGNOSIS — N1831 Chronic kidney disease, stage 3a: Secondary | ICD-10-CM | POA: Diagnosis not present

## 2022-09-10 DIAGNOSIS — R072 Precordial pain: Secondary | ICD-10-CM | POA: Diagnosis not present

## 2022-09-10 DIAGNOSIS — I5189 Other ill-defined heart diseases: Secondary | ICD-10-CM | POA: Diagnosis not present

## 2022-09-10 DIAGNOSIS — Z9981 Dependence on supplemental oxygen: Secondary | ICD-10-CM | POA: Diagnosis not present

## 2022-09-10 DIAGNOSIS — Z888 Allergy status to other drugs, medicaments and biological substances status: Secondary | ICD-10-CM | POA: Diagnosis not present

## 2022-09-10 DIAGNOSIS — R778 Other specified abnormalities of plasma proteins: Secondary | ICD-10-CM | POA: Diagnosis not present

## 2022-09-10 DIAGNOSIS — E119 Type 2 diabetes mellitus without complications: Secondary | ICD-10-CM | POA: Diagnosis not present

## 2022-09-10 DIAGNOSIS — R7989 Other specified abnormal findings of blood chemistry: Secondary | ICD-10-CM | POA: Diagnosis not present

## 2022-09-10 DIAGNOSIS — G4733 Obstructive sleep apnea (adult) (pediatric): Secondary | ICD-10-CM | POA: Diagnosis not present

## 2022-09-10 DIAGNOSIS — I5020 Unspecified systolic (congestive) heart failure: Secondary | ICD-10-CM | POA: Diagnosis not present

## 2022-09-10 DIAGNOSIS — E669 Obesity, unspecified: Secondary | ICD-10-CM | POA: Diagnosis not present

## 2022-09-11 DIAGNOSIS — R079 Chest pain, unspecified: Secondary | ICD-10-CM | POA: Diagnosis not present

## 2022-09-11 DIAGNOSIS — D72829 Elevated white blood cell count, unspecified: Secondary | ICD-10-CM | POA: Diagnosis not present

## 2022-09-11 DIAGNOSIS — E1169 Type 2 diabetes mellitus with other specified complication: Secondary | ICD-10-CM | POA: Diagnosis not present

## 2022-09-11 DIAGNOSIS — R7989 Other specified abnormal findings of blood chemistry: Secondary | ICD-10-CM | POA: Diagnosis not present

## 2022-09-11 DIAGNOSIS — I5030 Unspecified diastolic (congestive) heart failure: Secondary | ICD-10-CM | POA: Diagnosis not present

## 2022-09-11 DIAGNOSIS — I10 Essential (primary) hypertension: Secondary | ICD-10-CM | POA: Diagnosis not present

## 2022-09-11 DIAGNOSIS — G4733 Obstructive sleep apnea (adult) (pediatric): Secondary | ICD-10-CM | POA: Diagnosis not present

## 2022-09-11 DIAGNOSIS — I110 Hypertensive heart disease with heart failure: Secondary | ICD-10-CM | POA: Diagnosis not present

## 2022-09-11 DIAGNOSIS — I480 Paroxysmal atrial fibrillation: Secondary | ICD-10-CM | POA: Diagnosis not present

## 2022-09-11 DIAGNOSIS — R791 Abnormal coagulation profile: Secondary | ICD-10-CM | POA: Diagnosis not present

## 2022-09-11 DIAGNOSIS — J439 Emphysema, unspecified: Secondary | ICD-10-CM | POA: Diagnosis not present

## 2022-09-11 DIAGNOSIS — K219 Gastro-esophageal reflux disease without esophagitis: Secondary | ICD-10-CM | POA: Diagnosis not present

## 2022-09-11 DIAGNOSIS — I2510 Atherosclerotic heart disease of native coronary artery without angina pectoris: Secondary | ICD-10-CM | POA: Diagnosis not present

## 2022-09-11 DIAGNOSIS — I25118 Atherosclerotic heart disease of native coronary artery with other forms of angina pectoris: Secondary | ICD-10-CM | POA: Diagnosis not present

## 2022-09-11 DIAGNOSIS — E669 Obesity, unspecified: Secondary | ICD-10-CM | POA: Diagnosis not present

## 2022-09-12 ENCOUNTER — Encounter: Payer: Self-pay | Admitting: *Deleted

## 2022-09-12 ENCOUNTER — Telehealth: Payer: Self-pay | Admitting: *Deleted

## 2022-09-12 NOTE — Transitions of Care (Post Inpatient/ED Visit) (Signed)
   09/12/2022  Name: Ronald [name] MRN: [id number] DOB: 12-Nov-1945  Today's TOC FU Call Status: Today's TOC FU Call Status:: Successful TOC FU Call Competed TOC FU Call Complete Date: 09/12/22  Transition Care Management Follow-up Telephone Call Date of Discharge: 09/11/22 Discharge Facility: Other ([facility]) Name of Other ([facility]) Discharge Facility: [facility] Type of Discharge: Inpatient Admission Primary Inpatient Discharge Diagnosis:: COPD Exacerbation, CHF How have you been since you were released from the hospital?: Better Any questions or concerns?: No  Items Reviewed: Did you receive and understand the discharge instructions provided?: Yes Medications obtained and verified?: Yes (Medications Reviewed) Any new allergies since your discharge?: No Dietary orders reviewed?: Yes Type of Diet Ordered:: Heart Healthy Do you have support at home?: Yes People in Home: alone Name of Support/Comfort Primary Source: [name]-PCS Caregiver  Home Care and Equipment/Supplies: Were Home Health Services Ordered?: Yes Name of Home Health Agency:: [facility] Has Agency set up a time to come to your home?: No EMR reviewed for Home Health Orders: Orders present/patient has not received call (refer to CM for follow-up) Any new equipment or medical supplies ordered?: No (Oxygen orders updated through [facility])  Functional Questionnaire: Do you need assistance with bathing/showering or dressing?: Yes Do you need assistance with meal preparation?: Yes Do you need assistance with eating?: No Do you have difficulty maintaining continence: No Do you need assistance with getting out of bed/getting out of a chair/moving?: No Do you have difficulty managing or taking your medications?: No  Follow up appointments reviewed: PCP Follow-up appointment confirmed?: Yes Date of PCP follow-up appointment?: 09/23/22 Follow-up Provider: [name], FNP Specialist Hospital Follow-up appointment  confirmed?: Yes Date of Specialist follow-up appointment?: 09/19/22 Follow-Up Specialty Provider:: Sharlene Dory, NP (cardio) Do you need transportation to your follow-up appointment?: No Do you understand care options if your condition(s) worsen?: Yes-patient verbalized understanding  SDOH Interventions Today    Flowsheet Row Most Recent Value  SDOH Interventions   Transportation Interventions Intervention Not Indicated  Financial Strain Interventions Intervention Not Indicated      Interventions Today    Flowsheet Row Most Recent Value  General Interventions   General Interventions Discussed/Reviewed Communication with  Communication with RN  Irving Shows, RN Care Manager re: recent hospitalization, HH orders and updated oxygen orders]      TOC Interventions Today    Flowsheet Row Most Recent Value  TOC Interventions   TOC Interventions Discussed/Reviewed Arranged PCP follow up less than 12 days/Care Guide scheduled, TOC Interventions Discussed, TOC Interventions Reviewed      Demetrios Loll, BSN, RN-BC RN Care Coordinator [facility]  [facility] Direct Dial: [phone] Main #: [phone]

## 2022-09-13 ENCOUNTER — Other Ambulatory Visit: Payer: Self-pay | Admitting: Family

## 2022-09-15 ENCOUNTER — Encounter: Payer: Self-pay | Admitting: *Deleted

## 2022-09-16 ENCOUNTER — Telehealth: Payer: Self-pay | Admitting: Family

## 2022-09-16 NOTE — Telephone Encounter (Signed)
Hospital discontinued lasix. Daughter inlaw wants to make sure he is suppose to stop and take the sliding scale she is very concerned.

## 2022-09-16 NOTE — Telephone Encounter (Signed)
Daugher in law aware

## 2022-09-16 NOTE — Telephone Encounter (Signed)
He should continue to hold Lasix, his creatinine was very elevated. If he gains more than 3 lbs in one day or 5lbs in a week he should take one.   His A1C was at goal, so he should continue the same diabetic medications he was taking prior to hospital.

## 2022-09-16 NOTE — Telephone Encounter (Signed)
He needs hospital follow up appointment asap.

## 2022-09-16 NOTE — Telephone Encounter (Signed)
Has one schedule 04/16. She wants to see about this before then.

## 2022-09-19 ENCOUNTER — Ambulatory Visit: Payer: Medicare HMO | Attending: Nurse Practitioner | Admitting: Nurse Practitioner

## 2022-09-19 ENCOUNTER — Encounter: Payer: Self-pay | Admitting: Nurse Practitioner

## 2022-09-19 ENCOUNTER — Telehealth: Payer: Self-pay | Admitting: Family

## 2022-09-19 VITALS — BP 120/80 | HR 62 | Ht 69.0 in | Wt 172.6 lb

## 2022-09-19 DIAGNOSIS — Z720 Tobacco use: Secondary | ICD-10-CM

## 2022-09-19 DIAGNOSIS — I2510 Atherosclerotic heart disease of native coronary artery without angina pectoris: Secondary | ICD-10-CM | POA: Diagnosis not present

## 2022-09-19 DIAGNOSIS — N179 Acute kidney failure, unspecified: Secondary | ICD-10-CM

## 2022-09-19 DIAGNOSIS — E782 Mixed hyperlipidemia: Secondary | ICD-10-CM | POA: Diagnosis not present

## 2022-09-19 DIAGNOSIS — I10 Essential (primary) hypertension: Secondary | ICD-10-CM | POA: Diagnosis not present

## 2022-09-19 DIAGNOSIS — J449 Chronic obstructive pulmonary disease, unspecified: Secondary | ICD-10-CM | POA: Diagnosis not present

## 2022-09-19 DIAGNOSIS — N1831 Chronic kidney disease, stage 3a: Secondary | ICD-10-CM

## 2022-09-19 DIAGNOSIS — I480 Paroxysmal atrial fibrillation: Secondary | ICD-10-CM

## 2022-09-19 NOTE — Telephone Encounter (Signed)
Please review med list she emailed it was printed out and given to you on your desk please review and make sure she is to give this to him. From hospital

## 2022-09-19 NOTE — Progress Notes (Unsigned)
Cardiology Office Note:    Date:  09/19/2022  ID:  [name], DOB [birth year], MRN [id number]  PCP:  [name], FNP   [facility] Providers Cardiologist:  Dina Rich, MD     Referring MD: [name], FNP   CC: Here for follow-up   History of Present Illness:    Ronald [name] is a 77 y.o. male with a hx of the following:  CAD, s/p DES to LAD x 2, s/p NSTEMI in 2015 COPD HTN Hx of CVA/TIA T2DM Tobacco abuse UC/GERD PAF- on Eliquis  In July 2022 he was seen for sputum production and shortness of breath, PCR was positive for COVID-19 and was admitted to the hospital and treated with IV steroids and remdesivir.   Seen by Rennis Harding, NP on February 07, 2021 for 19-month follow-up visit.  Continued to note chronic dyspnea.  BP elevated at 180/88.  Denied any chest pain or acute cardiac complaints.  Stated that he had not seen pulmonology.  Reported using oxygen at night.  Referred to pulmonology.    Last saw this patient in February for follow-up. Was doing well. Stated increased dose of Imdur helped his symptoms some, however still endorsed similar chest pain from last office visit.  No other acute changes to his health since I saw him last.  Breathing stable.   Admitted early this month d/t COPD exacerbation. Workup significant for elevated D-dimer. VQ scan negative for PE. Trops 101>> 89>> 90. Elevated WBC 16.3. Tx with IV ABX and nebs, steroids and supplemental O2. Echo revealed EF 65-70%, grade 1 DD. NST negative for ischemia. Hospital course noted for AKI on CKD.   Today he presents for follow-up. CP has improved, denies any recurrence. Denies any shortness of breath, palpitations, syncope, presyncope, dizziness, orthopnea, PND, swelling or significant weight changes, acute bleeding, or claudication. Tolerating medications well.    Past Medical History:  Diagnosis Date   Anxiety    Asthma    Atrial flutter 04/2021   Chronic diastolic CHF  (congestive heart failure)    Chronic lower back pain    COPD (chronic obstructive pulmonary disease)    Coronary artery disease    a. NSTEMI 05/2014 s/p DESx2 to LAD at [facility].   Depression    GERD (gastroesophageal reflux disease)    High cholesterol    History of tracheostomy    Hypertension    NSTEMI (non-ST elevated myocardial infarction) 05/2014   with stent placement   PAF (paroxysmal atrial fibrillation)    Sleep apnea    Stroke 2017   anyeusym    TIA (transient ischemic attack)    "they say I've had some mini strokes; don't know when"; denies residual on 06/22/2014)   Tobacco abuse    Type II diabetes mellitus    Ulcerative colitis     Past Surgical History:  Procedure Laterality Date   APPENDECTOMY     BIOPSY  07/20/2020   Procedure: BIOPSY;  Surgeon: Dolores Frame, MD;  [facility];  Service: Gastroenterology;;   CARDIAC CATHETERIZATION  [phone]   CHOLECYSTECTOMY     COLONOSCOPY WITH PROPOFOL N/A 07/20/2020   Procedure: COLONOSCOPY WITH PROPOFOL;  Surgeon: Dolores Frame, MD;  [facility]: [facility];  Service: Gastroenterology;  Laterality: N/A;  1:15   CORONARY ANGIOPLASTY WITH STENT PLACEMENT  05/2014   "2"   ESOPHAGEAL DILATION N/A 07/20/2020   Procedure: ESOPHAGEAL DILATION;  Surgeon: Dolores Frame, MD;  [facility];  Service: Gastroenterology;  Laterality: N/A;   ESOPHAGOGASTRODUODENOSCOPY (EGD) WITH PROPOFOL N/A 07/20/2020   Procedure: ESOPHAGOGASTRODUODENOSCOPY (EGD) WITH PROPOFOL;  Surgeon: Dolores Frame, MD;  [facility];  Service: Gastroenterology;  Laterality: N/A;   IR GASTROSTOMY TUBE MOD SED  06/04/2021   IR GASTROSTOMY TUBE REMOVAL  07/24/2021   LEFT HEART CATH AND CORONARY ANGIOGRAPHY N/A 05/25/2020   Procedure: LEFT HEART CATH AND CORONARY ANGIOGRAPHY;  Surgeon: Swazil[place], Peter M, MD;  Location: [facility] INVASIVE CV LAB;  Service: Cardiovascular;  Laterality: N/A;    POLYPECTOMY  07/20/2020   Procedure: POLYPECTOMY INTESTINAL;  Surgeon: Marguerita Merles, Reuel Boom, MD;  Location: [facility];  Service: Gastroenterology;;   TUMOR EXCISION Right ~ 1999   "side of my upper head"   Current Meds  Medication Sig   acetaminophen (TYLENOL) 325 MG tablet Take 2 tablets (650 mg total) by mouth every 6 (six) hours as needed for mild pain (or Fever >/= 101).   albuterol (VENTOLIN HFA) 108 (90 Base) MCG/ACT inhaler INHALE 2 PUFFS BY MOUTH EVERY 6 HOURS AS NEEDED FOR WHEEZING FOR SHORTNESS OF BREATH   ALPRAZolam (XANAX) 0.25 MG tablet Take 1 tablet (0.25 mg total) by mouth 2 (two) times daily as needed for anxiety.   amiodarone (PACERONE) 200 MG tablet Take 1 tablet by mouth once daily   bisoprolol (ZEBETA) 5 MG tablet Take 1 tablet (5 mg total) by mouth daily. In Place of Metoprolol   budesonide (PULMICORT) 0.5 MG/2ML nebulizer solution Take 2 mLs (0.5 mg total) by nebulization 2 (two) times daily.   buPROPion (WELLBUTRIN XL) 150 MG 24 hr tablet Take 1 tablet (150 mg total) by mouth daily.   busPIRone (BUSPAR) 7.5 MG tablet Take 1 tablet (7.5 mg total) by mouth 3 (three) times daily as needed.   ELIQUIS 5 MG TABS tablet Take 1 tablet by mouth twice daily   empagliflozin (JARDIANCE) 25 MG TABS tablet Take 1 tablet (25 mg total) by mouth daily before breakfast.   Fluticasone-Umeclidin-Vilant (TRELEGY ELLIPTA) 200-62.5-25 MCG/ACT AEPB Inhale into the lungs.   furosemide (LASIX) 40 MG tablet Take 1 tablet by mouth twice daily   glucose blood (ONE TOUCH ULTRA TEST) test strip USE TO CHECK GLUCOSE ONCE DAILY   ipratropium-albuterol (DUONEB) 0.5-2.5 (3) MG/3ML SOLN Take 3 mLs by nebulization every 6 (six) hours as needed (asthma).   isosorbide mononitrate (IMDUR) 30 MG 24 hr tablet Take 1 tablet (30 mg total) by mouth daily.   KLOR-CON 20 MEQ packet DISSOLVE 1 PACKET IN WATER & DRINK TWICE DAILY   nitroGLYCERIN (NITROSTAT) 0.4 MG SL tablet Place 1 tablet (0.4 mg total) under  the tongue every 5 (five) minutes as needed for chest pain.   omeprazole (PRILOSEC) 20 MG capsule Take 1 capsule (20 mg total) by mouth daily.   OXYGEN Inhale 3 L into the lungs at bedtime as needed (shortness of breath).   polyethylene glycol (MIRALAX / GLYCOLAX) 17 g packet Take 17 g by mouth daily.   potassium chloride SA (KLOR-CON M20) 20 MEQ tablet Take 1 tablet by mouth twice daily   triamcinolone ointment (KENALOG) 0.5 % Apply 1 Application topically 2 (two) times daily.   Allergies:   Gabapentin and Metformin and related   Social History   Socioeconomic History   Marital status: Significant Other    Spouse name: [name]   Number of children: 1   Years of education: Not on file   Highest education level: Not on  file  Occupational History   Occupation: Retired    Comment: Chief Technology Officer  Tobacco Use   Smoking status: Every Day    Packs/day: 0.50    Years: 48.00    Additional pack years: 0.00    Total pack years: 24.00    Types: Cigarettes    Start date: 02/12/1966    Last attempt to quit: 07/03/2021    Years since quitting: 1.2    Passive exposure: Never   Smokeless tobacco: Never   Tobacco comments:    smokes 1 ppd  05/20/22  Vaping Use   Vaping Use: Never used  Substance and Sexual Activity   Alcohol use: Not Currently    Alcohol/week: 0.0 standard drinks of alcohol   Drug use: No   Sexual activity: Never  Other Topics Concern   Not on file  Social History Narrative   Staying with his ex-wife, [name]   Has 2 children (today he told me one child 01/15/21)   Social Determinants of Health   Financial Resource Strain: Low Risk  (09/12/2022)   Overall Financial Resource Strain (CARDIA)    Difficulty of Paying Living Expenses: Not very hard  Food Insecurity: No Food Insecurity (05/20/2022)   Hunger Vital Sign    Worried About Running Out of Food in the Last Year: Never true    Ran Out of Food in the Last Year: Never true  Transportation Needs: No Transportation Needs  (09/12/2022)   PRAPARE - Administrator, Civil Service (Medical): No    Lack of Transportation (Non-Medical): No  Physical Activity: Inactive (05/20/2022)   Exercise Vital Sign    Days of Exercise per Week: 0 days    Minutes of Exercise per Session: 0 min  Stress: No Stress Concern Present (05/20/2022)   [employer] of Occupational Health - Occupational Stress Questionnaire    Feeling of Stress : Only a little  Social Connections: Moderately Integrated (05/20/2022)   Social Connection and Isolation Panel [NHANES]    Frequency of Communication with Friends and Family: Twice a week    Frequency of Social Gatherings with Friends and Family: Once a week    Attends Religious Services: Never    Database administrator or Organizations: Yes    Attends Banker Meetings: Never    Marital Status: Living with partner     Family History: The patient's family history includes CAD in his father; Cancer in his brother and brother; Dementia in his sister; Emphysema in his sister; Leukemia in his sister; Lung cancer in his brother; Stroke in his mother.  ROS:     Please see the history of present illness.    All other systems reviewed and are negative.  EKGs/Labs/Other Studies Reviewed:    The following studies were reviewed today:   EKG:  EKG is not ordered today.   NST 09/11/2022 [facility]): 1. No reversible ischemia. Small scar in the inferior septal wall. 2. Normal left ventricular wall motion. 3. Left ventricular ejection fraction 60% 4. Non invasive risk stratification*: Low   Echo 09/10/2022 [facility]):  The left ventricle is normal in size with mildly increased wall thickness. 2. The left ventricular systolic function is normal, LVEF is visually estimated at 65-70%. 3. There is grade I diastolic dysfunction (impaired relaxation). 4. The left atrium is mildly to moderately dilated in size. 5. The right ventricle is normal in size, with normal systolic  function.  2D echocardiogram on September 01, 2021:  1. Left ventricular  ejection fraction, by estimation, is 55 to 60%. The  left ventricle has normal function. The left ventricle demonstrates  regional wall motion abnormalities (see scoring diagram/findings for  description). There is mild left ventricular   hypertrophy. Left ventricular diastolic parameters are consistent with  Grade I diastolic dysfunction (impaired relaxation). Elevated left  ventricular end-diastolic pressure. The E/e' is 20. There is moderate  hypokinesis of the left ventricular, basal  inferoseptal wall, inferolateral wall and septal wall.   2. Right ventricular systolic function is hyperdynamic. The right  ventricular size is normal.   3. Left atrial size was mildly dilated.   4. The mitral valve is abnormal. Trivial mitral valve regurgitation.   5. The aortic valve is tricuspid. Aortic valve regurgitation is not  visualized.   6. The inferior vena cava is normal in size with greater than 50%  respiratory variability, suggesting right atrial pressure of 3 mmHg.   7. Cannot exclude a small PFO.   Comparison(s): Changes from prior study are noted. 05/07/2021: LVEF  50-55%, no regional wall motion abnormalities.  Left heart cath and coronary angiography on May 25, 2020: Previously placed Mid LAD stent (unknown type) is widely patent. Mid LAD to Dist LAD lesion is 50% stenosed. Dist LAD lesion is 50% stenosed. Prox RCA lesion is 60% stenosed. Mid RCA lesion is 50% stenosed. RV Branch lesion is 80% stenosed. LV end diastolic pressure is mildly elevated.   1. Modest 2 vessel CAD. The stent in the LAD is widely patent.     50% stenosis in the mid and distal LAD    60% proximal and 50% mid RCA. 80% diffuse disease in the RV marginal branch 2. LVEDP 16 mm Hg   Plan: would recommend medical therapy. No culprit lesion for his chest pain identified.   Recent Labs: 11/23/2021: Magnesium 2.3 01/24/2022: ALT  8 03/17/2022: B Natriuretic Peptide 375.0; BUN 12; Creatinine, Ser 1.55; Hemoglobin 12.4; Platelets 378; Potassium 2.9; Sodium 142  Recent Lipid Panel    Component Value Date/Time   CHOL 156 08/14/2021 1531   TRIG 157 (H) 08/14/2021 1531   HDL 48 08/14/2021 1531   CHOLHDL 3.3 08/14/2021 1531   CHOLHDL 3.8 05/25/2020 0453   VLDL 29 05/25/2020 0453   LDLCALC 81 08/14/2021 1531    Physical Exam:    VS:  BP 120/80 (BP Location: Left Arm, Patient Position: Sitting, Cuff Size: Normal)   Pulse 62   Ht 5\' 9"  (1.753 m)   Wt 172 lb 9.6 oz (78.3 kg)   SpO2 94%   BMI 25.49 kg/m     Wt Readings from Last 3 Encounters:  09/19/22 172 lb 9.6 oz (78.3 kg)  07/24/22 173 lb 9.6 oz (78.7 kg)  07/07/22 171 lb (77.6 kg)     GEN: Well nourished, well developed in no acute distress HEENT: Normal NECK: No JVD; No carotid bruits CARDIAC: S1/S2, RRR, no murmurs, rubs, gallops; 2+ peripheral pulses throughout, strong and equal bilaterally RESPIRATORY:  Clear and diminished to auscultation without rales, wheezing or rhonchi  MUSCULOSKELETAL:  No lower extremity edema, minimal, nonpitting edema along LUE >RUE (chronic per his report); No deformity  SKIN: Warm and dry NEUROLOGIC:  Alert and oriented x 3 PSYCHIATRIC:  Normal affect   ASSESSMENT:    1. Coronary artery disease involving native heart without angina pectoris, unspecified vessel or lesion type   2. PAF (paroxysmal atrial fibrillation)   3. Mixed hyperlipidemia   4. Essential hypertension, benign   5. Chronic obstructive  pulmonary disease, unspecified COPD type   6. Tobacco abuse   7. Chronic renal failure, stage 3a   8. AKI (acute kidney injury)     PLAN:    In order of problems listed above:  CAD, s/p DES to LAD x 2, s/p NSTEMI in 2015 Stable with no anginal symptoms. No indication for ischemic evaluation. Recent NST low risk. Cardiac cath in 2021 did not indicate intervention, medically managed. Continue bisoprolol, Imdur, NG,  and Crestor. Heart healthy diet and regular cardiovascular exercise encouraged. ED precautions discussed.   2. Paroxysmal A-fib Denies any tachycardia or palpitations.  CHA2DS2-VASc score 6.  Continue Eliquis 5 mg twice daily.  Currently on appropriate dosage, denies any bleeding issues. Continue bisoprolol. Heart healthy diet and regular cardiovascular exercise encouraged.    3. Mixed HLD Last lipid profile revealed total cholesterol 156, HDL 48, triglycerides 157, and LDL 81.  Continue rosuvastatin. Heart healthy diet and regular cardiovascular exercise encouraged.   4. HTN BP elevated on arrival, BP normal on recheck. Continue current medication regimen.  Discussed to monitor BP at home at least 2 hours after medications and sitting for 5-10 minutes. Heart healthy diet and regular cardiovascular exercise encouraged.   5. COPD Recent admission d/t COPD exacerbation. Denies any recent shortness of breath. Continue to follow-up with pulmonology.  Continue current medication regimen.  6. Tobacco abuse Smoking cessation encouraged and discussed.  7. Chronic kidney disease stage 3a, recent AKI Baseline Creatinine is around 1.7. Most recent sCr was 2.16 with eGFR at 31. Patient defers labs to upcoming appt with PCP. Continue to follow with PCP.  8.  Disposition:  Follow-up with Dr. Dina Rich as scheduled or sooner if anything changes.   Medication Adjustments/Labs and Tests Ordered: Current medicines are reviewed at length with the patient today.  Concerns regarding medicines are outlined above.  No orders of the defined types were placed in this encounter.  No orders of the defined types were placed in this encounter.   Patient Instructions  Medication Instructions:  Your physician recommends that you continue on your current medications as directed. Please refer to the Current Medication list given to you today.   Labwork: none  Testing/Procedures: none  Follow-Up:  Your  physician recommends that you schedule a follow-up appointment in: Keep scheduled appointment with Dr. Wyline Mood.  Any Other Special Instructions Will Be Listed Below (If Applicable).  If you need a refill on your cardiac medications before your next appointment, please call your pharmacy.    Signed, Sharlene Dory, NP  09/20/2022 10:45 PM    [facility]

## 2022-09-19 NOTE — Telephone Encounter (Signed)
Spoke to patient dil he is not going to do this the hospital ordered.

## 2022-09-19 NOTE — Telephone Encounter (Signed)
Med looks correct to me. Did he have a sleep study at hospital or did pulmonologist order this?

## 2022-09-19 NOTE — Patient Instructions (Signed)
Medication Instructions:  Your physician recommends that you continue on your current medications as directed. Please refer to the Current Medication list given to you today.   Labwork: none  Testing/Procedures: none  Follow-Up:  Your physician recommends that you schedule a follow-up appointment in: Keep scheduled appointment with Dr. Branch  Any Other Special Instructions Will Be Listed Below (If Applicable).  If you need a refill on your cardiac medications before your next appointment, please call your pharmacy.  

## 2022-09-19 NOTE — Telephone Encounter (Signed)
She is not aware of any sleep study

## 2022-09-20 ENCOUNTER — Encounter: Payer: Self-pay | Admitting: Nurse Practitioner

## 2022-09-23 ENCOUNTER — Ambulatory Visit: Payer: Medicare HMO | Admitting: Family

## 2022-09-25 ENCOUNTER — Other Ambulatory Visit: Payer: Self-pay | Admitting: Family

## 2022-09-25 DIAGNOSIS — G8929 Other chronic pain: Secondary | ICD-10-CM | POA: Diagnosis not present

## 2022-09-25 DIAGNOSIS — Z7984 Long term (current) use of oral hypoglycemic drugs: Secondary | ICD-10-CM | POA: Diagnosis not present

## 2022-09-25 DIAGNOSIS — F419 Anxiety disorder, unspecified: Secondary | ICD-10-CM | POA: Diagnosis not present

## 2022-09-25 DIAGNOSIS — I480 Paroxysmal atrial fibrillation: Secondary | ICD-10-CM | POA: Diagnosis not present

## 2022-09-25 DIAGNOSIS — E669 Obesity, unspecified: Secondary | ICD-10-CM | POA: Diagnosis not present

## 2022-09-25 DIAGNOSIS — I4892 Unspecified atrial flutter: Secondary | ICD-10-CM | POA: Diagnosis not present

## 2022-09-25 DIAGNOSIS — I509 Heart failure, unspecified: Secondary | ICD-10-CM

## 2022-09-25 DIAGNOSIS — N1831 Chronic kidney disease, stage 3a: Secondary | ICD-10-CM | POA: Diagnosis not present

## 2022-09-25 DIAGNOSIS — I700 Atherosclerosis of aorta: Secondary | ICD-10-CM | POA: Diagnosis not present

## 2022-09-25 DIAGNOSIS — J439 Emphysema, unspecified: Secondary | ICD-10-CM | POA: Diagnosis not present

## 2022-09-25 DIAGNOSIS — J441 Chronic obstructive pulmonary disease with (acute) exacerbation: Secondary | ICD-10-CM | POA: Diagnosis not present

## 2022-09-25 DIAGNOSIS — K219 Gastro-esophageal reflux disease without esophagitis: Secondary | ICD-10-CM | POA: Diagnosis not present

## 2022-09-25 DIAGNOSIS — E782 Mixed hyperlipidemia: Secondary | ICD-10-CM | POA: Diagnosis not present

## 2022-09-25 DIAGNOSIS — I2690 Septic pulmonary embolism without acute cor pulmonale: Secondary | ICD-10-CM | POA: Diagnosis not present

## 2022-09-25 DIAGNOSIS — E1122 Type 2 diabetes mellitus with diabetic chronic kidney disease: Secondary | ICD-10-CM | POA: Diagnosis not present

## 2022-09-25 DIAGNOSIS — I82409 Acute embolism and thrombosis of unspecified deep veins of unspecified lower extremity: Secondary | ICD-10-CM | POA: Diagnosis not present

## 2022-09-25 DIAGNOSIS — I252 Old myocardial infarction: Secondary | ICD-10-CM | POA: Diagnosis not present

## 2022-09-25 DIAGNOSIS — Z7901 Long term (current) use of anticoagulants: Secondary | ICD-10-CM | POA: Diagnosis not present

## 2022-09-25 DIAGNOSIS — I2510 Atherosclerotic heart disease of native coronary artery without angina pectoris: Secondary | ICD-10-CM | POA: Diagnosis not present

## 2022-09-25 DIAGNOSIS — M5459 Other low back pain: Secondary | ICD-10-CM | POA: Diagnosis not present

## 2022-09-25 DIAGNOSIS — I5032 Chronic diastolic (congestive) heart failure: Secondary | ICD-10-CM | POA: Diagnosis not present

## 2022-09-25 DIAGNOSIS — F17210 Nicotine dependence, cigarettes, uncomplicated: Secondary | ICD-10-CM | POA: Diagnosis not present

## 2022-09-25 DIAGNOSIS — F32A Depression, unspecified: Secondary | ICD-10-CM | POA: Diagnosis not present

## 2022-09-25 DIAGNOSIS — G4733 Obstructive sleep apnea (adult) (pediatric): Secondary | ICD-10-CM | POA: Diagnosis not present

## 2022-09-25 DIAGNOSIS — I130 Hypertensive heart and chronic kidney disease with heart failure and stage 1 through stage 4 chronic kidney disease, or unspecified chronic kidney disease: Secondary | ICD-10-CM | POA: Diagnosis not present

## 2022-09-26 ENCOUNTER — Telehealth: Payer: Self-pay | Admitting: Family

## 2022-09-26 DIAGNOSIS — E669 Obesity, unspecified: Secondary | ICD-10-CM | POA: Diagnosis not present

## 2022-09-26 DIAGNOSIS — M5459 Other low back pain: Secondary | ICD-10-CM | POA: Diagnosis not present

## 2022-09-26 DIAGNOSIS — I480 Paroxysmal atrial fibrillation: Secondary | ICD-10-CM | POA: Diagnosis not present

## 2022-09-26 DIAGNOSIS — F17210 Nicotine dependence, cigarettes, uncomplicated: Secondary | ICD-10-CM | POA: Diagnosis not present

## 2022-09-26 DIAGNOSIS — I5032 Chronic diastolic (congestive) heart failure: Secondary | ICD-10-CM | POA: Diagnosis not present

## 2022-09-26 DIAGNOSIS — N1831 Chronic kidney disease, stage 3a: Secondary | ICD-10-CM | POA: Diagnosis not present

## 2022-09-26 DIAGNOSIS — I700 Atherosclerosis of aorta: Secondary | ICD-10-CM | POA: Diagnosis not present

## 2022-09-26 DIAGNOSIS — J439 Emphysema, unspecified: Secondary | ICD-10-CM | POA: Diagnosis not present

## 2022-09-26 DIAGNOSIS — I4892 Unspecified atrial flutter: Secondary | ICD-10-CM | POA: Diagnosis not present

## 2022-09-26 DIAGNOSIS — Z7901 Long term (current) use of anticoagulants: Secondary | ICD-10-CM | POA: Diagnosis not present

## 2022-09-26 DIAGNOSIS — E1122 Type 2 diabetes mellitus with diabetic chronic kidney disease: Secondary | ICD-10-CM | POA: Diagnosis not present

## 2022-09-26 DIAGNOSIS — I82409 Acute embolism and thrombosis of unspecified deep veins of unspecified lower extremity: Secondary | ICD-10-CM | POA: Diagnosis not present

## 2022-09-26 DIAGNOSIS — G4733 Obstructive sleep apnea (adult) (pediatric): Secondary | ICD-10-CM | POA: Diagnosis not present

## 2022-09-26 DIAGNOSIS — F419 Anxiety disorder, unspecified: Secondary | ICD-10-CM | POA: Diagnosis not present

## 2022-09-26 DIAGNOSIS — E782 Mixed hyperlipidemia: Secondary | ICD-10-CM | POA: Diagnosis not present

## 2022-09-26 DIAGNOSIS — I130 Hypertensive heart and chronic kidney disease with heart failure and stage 1 through stage 4 chronic kidney disease, or unspecified chronic kidney disease: Secondary | ICD-10-CM | POA: Diagnosis not present

## 2022-09-26 DIAGNOSIS — G8929 Other chronic pain: Secondary | ICD-10-CM | POA: Diagnosis not present

## 2022-09-26 DIAGNOSIS — I252 Old myocardial infarction: Secondary | ICD-10-CM | POA: Diagnosis not present

## 2022-09-26 DIAGNOSIS — I2510 Atherosclerotic heart disease of native coronary artery without angina pectoris: Secondary | ICD-10-CM | POA: Diagnosis not present

## 2022-09-26 DIAGNOSIS — I2690 Septic pulmonary embolism without acute cor pulmonale: Secondary | ICD-10-CM | POA: Diagnosis not present

## 2022-09-26 DIAGNOSIS — K219 Gastro-esophageal reflux disease without esophagitis: Secondary | ICD-10-CM | POA: Diagnosis not present

## 2022-09-26 DIAGNOSIS — F32A Depression, unspecified: Secondary | ICD-10-CM | POA: Diagnosis not present

## 2022-09-26 DIAGNOSIS — J441 Chronic obstructive pulmonary disease with (acute) exacerbation: Secondary | ICD-10-CM | POA: Diagnosis not present

## 2022-09-26 DIAGNOSIS — Z7984 Long term (current) use of oral hypoglycemic drugs: Secondary | ICD-10-CM | POA: Diagnosis not present

## 2022-09-26 NOTE — Telephone Encounter (Signed)
Ok, will address at next appointment if still elevated

## 2022-09-30 DIAGNOSIS — I480 Paroxysmal atrial fibrillation: Secondary | ICD-10-CM | POA: Diagnosis not present

## 2022-09-30 DIAGNOSIS — Z7901 Long term (current) use of anticoagulants: Secondary | ICD-10-CM | POA: Diagnosis not present

## 2022-09-30 DIAGNOSIS — I700 Atherosclerosis of aorta: Secondary | ICD-10-CM | POA: Diagnosis not present

## 2022-09-30 DIAGNOSIS — G4733 Obstructive sleep apnea (adult) (pediatric): Secondary | ICD-10-CM | POA: Diagnosis not present

## 2022-09-30 DIAGNOSIS — K219 Gastro-esophageal reflux disease without esophagitis: Secondary | ICD-10-CM | POA: Diagnosis not present

## 2022-09-30 DIAGNOSIS — J439 Emphysema, unspecified: Secondary | ICD-10-CM | POA: Diagnosis not present

## 2022-09-30 DIAGNOSIS — F32A Depression, unspecified: Secondary | ICD-10-CM | POA: Diagnosis not present

## 2022-09-30 DIAGNOSIS — E1122 Type 2 diabetes mellitus with diabetic chronic kidney disease: Secondary | ICD-10-CM | POA: Diagnosis not present

## 2022-09-30 DIAGNOSIS — Z7984 Long term (current) use of oral hypoglycemic drugs: Secondary | ICD-10-CM | POA: Diagnosis not present

## 2022-09-30 DIAGNOSIS — G8929 Other chronic pain: Secondary | ICD-10-CM | POA: Diagnosis not present

## 2022-09-30 DIAGNOSIS — N1831 Chronic kidney disease, stage 3a: Secondary | ICD-10-CM | POA: Diagnosis not present

## 2022-09-30 DIAGNOSIS — F17210 Nicotine dependence, cigarettes, uncomplicated: Secondary | ICD-10-CM | POA: Diagnosis not present

## 2022-09-30 DIAGNOSIS — I2510 Atherosclerotic heart disease of native coronary artery without angina pectoris: Secondary | ICD-10-CM | POA: Diagnosis not present

## 2022-09-30 DIAGNOSIS — I4892 Unspecified atrial flutter: Secondary | ICD-10-CM | POA: Diagnosis not present

## 2022-09-30 DIAGNOSIS — I130 Hypertensive heart and chronic kidney disease with heart failure and stage 1 through stage 4 chronic kidney disease, or unspecified chronic kidney disease: Secondary | ICD-10-CM | POA: Diagnosis not present

## 2022-09-30 DIAGNOSIS — E669 Obesity, unspecified: Secondary | ICD-10-CM | POA: Diagnosis not present

## 2022-09-30 DIAGNOSIS — I2690 Septic pulmonary embolism without acute cor pulmonale: Secondary | ICD-10-CM | POA: Diagnosis not present

## 2022-09-30 DIAGNOSIS — M5459 Other low back pain: Secondary | ICD-10-CM | POA: Diagnosis not present

## 2022-09-30 DIAGNOSIS — E782 Mixed hyperlipidemia: Secondary | ICD-10-CM | POA: Diagnosis not present

## 2022-09-30 DIAGNOSIS — J441 Chronic obstructive pulmonary disease with (acute) exacerbation: Secondary | ICD-10-CM | POA: Diagnosis not present

## 2022-09-30 DIAGNOSIS — I82409 Acute embolism and thrombosis of unspecified deep veins of unspecified lower extremity: Secondary | ICD-10-CM | POA: Diagnosis not present

## 2022-09-30 DIAGNOSIS — F419 Anxiety disorder, unspecified: Secondary | ICD-10-CM | POA: Diagnosis not present

## 2022-09-30 DIAGNOSIS — I5032 Chronic diastolic (congestive) heart failure: Secondary | ICD-10-CM | POA: Diagnosis not present

## 2022-09-30 DIAGNOSIS — I252 Old myocardial infarction: Secondary | ICD-10-CM | POA: Diagnosis not present

## 2022-09-30 NOTE — Telephone Encounter (Signed)
Called [facility] and they are aware.

## 2022-09-30 NOTE — Telephone Encounter (Signed)
Patient daughter in-law aware. States Ronald [name] refusses and just will wait to talk to [place] at appointment on 04/25.

## 2022-09-30 NOTE — Telephone Encounter (Signed)
TC from [place] w/ [facility] Pt is not needing CPAP supplies he is to be getting a NID machine and he had a liter flow increase on his oxygen but is not accepting new oxygen equipment or answering his door. Wanting to see if we can help get this communicated so this can get done.  If you have any questions you can call Arvella Merles w/ [employer] back directly at [phone]

## 2022-09-30 NOTE — Telephone Encounter (Signed)
Please contact daughter in law to let them know below message. Also he needs a follow up visit for labs and medications with me.

## 2022-10-01 ENCOUNTER — Telehealth: Payer: Self-pay | Admitting: Family

## 2022-10-01 ENCOUNTER — Telehealth: Payer: Self-pay | Admitting: *Deleted

## 2022-10-01 ENCOUNTER — Other Ambulatory Visit: Payer: Self-pay | Admitting: *Deleted

## 2022-10-01 DIAGNOSIS — I480 Paroxysmal atrial fibrillation: Secondary | ICD-10-CM | POA: Diagnosis not present

## 2022-10-01 DIAGNOSIS — F32A Depression, unspecified: Secondary | ICD-10-CM | POA: Diagnosis not present

## 2022-10-01 DIAGNOSIS — I700 Atherosclerosis of aorta: Secondary | ICD-10-CM | POA: Diagnosis not present

## 2022-10-01 DIAGNOSIS — I5032 Chronic diastolic (congestive) heart failure: Secondary | ICD-10-CM | POA: Diagnosis not present

## 2022-10-01 DIAGNOSIS — Z7901 Long term (current) use of anticoagulants: Secondary | ICD-10-CM | POA: Diagnosis not present

## 2022-10-01 DIAGNOSIS — I130 Hypertensive heart and chronic kidney disease with heart failure and stage 1 through stage 4 chronic kidney disease, or unspecified chronic kidney disease: Secondary | ICD-10-CM | POA: Diagnosis not present

## 2022-10-01 DIAGNOSIS — J439 Emphysema, unspecified: Secondary | ICD-10-CM | POA: Diagnosis not present

## 2022-10-01 DIAGNOSIS — E1122 Type 2 diabetes mellitus with diabetic chronic kidney disease: Secondary | ICD-10-CM | POA: Diagnosis not present

## 2022-10-01 DIAGNOSIS — N1831 Chronic kidney disease, stage 3a: Secondary | ICD-10-CM | POA: Diagnosis not present

## 2022-10-01 DIAGNOSIS — J441 Chronic obstructive pulmonary disease with (acute) exacerbation: Secondary | ICD-10-CM | POA: Diagnosis not present

## 2022-10-01 DIAGNOSIS — I2690 Septic pulmonary embolism without acute cor pulmonale: Secondary | ICD-10-CM | POA: Diagnosis not present

## 2022-10-01 DIAGNOSIS — E782 Mixed hyperlipidemia: Secondary | ICD-10-CM | POA: Diagnosis not present

## 2022-10-01 DIAGNOSIS — G4733 Obstructive sleep apnea (adult) (pediatric): Secondary | ICD-10-CM | POA: Diagnosis not present

## 2022-10-01 DIAGNOSIS — K219 Gastro-esophageal reflux disease without esophagitis: Secondary | ICD-10-CM | POA: Diagnosis not present

## 2022-10-01 DIAGNOSIS — I2510 Atherosclerotic heart disease of native coronary artery without angina pectoris: Secondary | ICD-10-CM | POA: Diagnosis not present

## 2022-10-01 DIAGNOSIS — I4892 Unspecified atrial flutter: Secondary | ICD-10-CM | POA: Diagnosis not present

## 2022-10-01 DIAGNOSIS — Z7984 Long term (current) use of oral hypoglycemic drugs: Secondary | ICD-10-CM | POA: Diagnosis not present

## 2022-10-01 DIAGNOSIS — E669 Obesity, unspecified: Secondary | ICD-10-CM | POA: Diagnosis not present

## 2022-10-01 DIAGNOSIS — F419 Anxiety disorder, unspecified: Secondary | ICD-10-CM | POA: Diagnosis not present

## 2022-10-01 DIAGNOSIS — I82409 Acute embolism and thrombosis of unspecified deep veins of unspecified lower extremity: Secondary | ICD-10-CM | POA: Diagnosis not present

## 2022-10-01 DIAGNOSIS — G8929 Other chronic pain: Secondary | ICD-10-CM | POA: Diagnosis not present

## 2022-10-01 DIAGNOSIS — F17210 Nicotine dependence, cigarettes, uncomplicated: Secondary | ICD-10-CM | POA: Diagnosis not present

## 2022-10-01 DIAGNOSIS — I252 Old myocardial infarction: Secondary | ICD-10-CM | POA: Diagnosis not present

## 2022-10-01 DIAGNOSIS — M5459 Other low back pain: Secondary | ICD-10-CM | POA: Diagnosis not present

## 2022-10-01 NOTE — Telephone Encounter (Signed)
Home Health therapist called to report high B/P reading 180/90. Patient admits missed dose last night. Patient took meds this AM. Recent reading 168/80. HH therapist obliged to report.  HHOT requests verbal orders to extend 1week2. Verbal orders given per clinic protocol.

## 2022-10-02 ENCOUNTER — Encounter: Payer: Self-pay | Admitting: Family

## 2022-10-02 ENCOUNTER — Ambulatory Visit (INDEPENDENT_AMBULATORY_CARE_PROVIDER_SITE_OTHER): Payer: Medicare HMO | Admitting: Family

## 2022-10-02 VITALS — BP 152/63 | HR 64 | Temp 98.0°F | Ht 69.0 in | Wt 170.0 lb

## 2022-10-02 DIAGNOSIS — R69 Illness, unspecified: Secondary | ICD-10-CM | POA: Diagnosis not present

## 2022-10-02 DIAGNOSIS — J9611 Chronic respiratory failure with hypoxia: Secondary | ICD-10-CM

## 2022-10-02 DIAGNOSIS — F17210 Nicotine dependence, cigarettes, uncomplicated: Secondary | ICD-10-CM

## 2022-10-02 DIAGNOSIS — I509 Heart failure, unspecified: Secondary | ICD-10-CM

## 2022-10-02 DIAGNOSIS — Z720 Tobacco use: Secondary | ICD-10-CM | POA: Diagnosis not present

## 2022-10-02 DIAGNOSIS — J439 Emphysema, unspecified: Secondary | ICD-10-CM | POA: Diagnosis not present

## 2022-10-02 DIAGNOSIS — E1142 Type 2 diabetes mellitus with diabetic polyneuropathy: Secondary | ICD-10-CM

## 2022-10-02 NOTE — Telephone Encounter (Signed)
Pt has hospital follow up today, will address today at visit.   [name], FNP

## 2022-10-02 NOTE — Patient Instructions (Signed)

## 2022-10-02 NOTE — Progress Notes (Signed)
Subjective:    Patient ID: Ronald [name], male    DOB: June 22, 1945, 77 y.o.   MRN: [id number]  Chief Complaint  Patient presents with   Transitions Of Care   Pt  presents to the office today for hospital follow up. He went to the ED on 09/09/22 for SOB and diagnosed with COPD exacerbation. He was discharged on 09/11/22. Reports his breathing is greatly improved.   He was discharged on Zpak for 5 days and oral steroids.   He has PT working with him once a week.   His A1C in hospital on 09/10/22 was 6.6 Shortness of Breath This is a recurrent problem. The current episode started 1 to 4 weeks ago. Pertinent negatives include no ear pain or fever.  Hypertension This is a chronic problem. The current episode started more than 1 year ago. The problem is unchanged. The problem is uncontrolled. Associated symptoms include malaise/fatigue, peripheral edema and shortness of breath. Risk factors for coronary artery disease include dyslipidemia, obesity, male gender, sedentary lifestyle and smoking/tobacco exposure.  Nicotine Dependence Presents for follow-up visit. Symptoms include fatigue. His urge triggers include company of smokers. The symptoms have been stable. He smokes < 1/2 a pack of cigarettes per day.  Congestive Heart Failure Presents for follow-up visit. Associated symptoms include edema, fatigue and shortness of breath. The symptoms have been stable.  Diabetes He presents for his follow-up diabetic visit. He has type 2 diabetes mellitus. Associated symptoms include fatigue and foot paresthesias. Risk factors for coronary artery disease include dyslipidemia, diabetes mellitus, hypertension, male sex and sedentary lifestyle. (Not checking BS at home)      Review of Systems  Constitutional:  Positive for fatigue and malaise/fatigue. Negative for fever.  HENT:  Negative for ear pain.   Respiratory:  Positive for shortness of breath.   All other systems reviewed and are negative.       Objective:   Physical Exam Vitals reviewed.  Constitutional:      General: He is not in acute distress.    Appearance: He is well-developed.  HENT:     Head: Normocephalic.     Right Ear: Tympanic membrane normal.     Left Ear: Tympanic membrane normal.  Eyes:     General:        Right eye: No discharge.        Left eye: No discharge.     Pupils: Pupils are equal, round, and reactive to light.  Neck:     Thyroid: No thyromegaly.  Cardiovascular:     Rate and Rhythm: Normal rate and regular rhythm.     Heart sounds: Normal heart sounds. No murmur heard. Pulmonary:     Effort: Pulmonary effort is normal. No respiratory distress.     Breath sounds: Normal breath sounds. No wheezing.  Abdominal:     General: Bowel sounds are normal. There is no distension.     Palpations: Abdomen is soft.     Tenderness: There is no abdominal tenderness.  Musculoskeletal:        General: No tenderness. Normal range of motion.     Cervical back: Normal range of motion and neck supple.     Comments: Bilateral lymphedema in arms  Skin:    General: Skin is warm and dry.     Findings: No erythema or rash.  Neurological:     Mental Status: He is alert and oriented to person, place, and time.     Cranial Nerves: No cranial  nerve deficit.     Deep Tendon Reflexes: Reflexes are normal and symmetric.  Psychiatric:        Behavior: Behavior normal.        Thought Content: Thought content normal.        Judgment: Judgment normal.        BP (!) 152/63   Pulse 64   Temp 98 F (36.7 C) (Temporal)   Ht  (1.753 m)   Wt 170 lb (77.1 kg)   SpO2 92%   BMI 25.10 kg/m   Assessment & Plan:  Ronald [name] comes in today with chief complaint of Transitions Of Care   Diagnosis and orders addressed:  1. Congestive heart failure, unspecified HF chronicity, unspecified heart failure type - CMP14+EGFR - CBC with Differential/Platelet - Brain natriuretic peptide  2. Chronic respiratory failure  with hypoxia - CMP14+EGFR - CBC with Differential/Platelet  3. Cigarette smoker - CMP14+EGFR - CBC with Differential/Platelet  4. Pulmonary emphysema, unspecified emphysema type - CMP14+EGFR - CBC with Differential/Platelet  5. Type 2 diabetes mellitus with diabetic polyneuropathy, without long-term current use of insulin - CMP14+EGFR - CBC with Differential/Platelet  6. Tobacco abuse - CMP14+EGFR - CBC with Differential/Platelet   Labs pending Health Maintenance reviewed Diet and exercise encouraged  Follow up plan: 3 months    [name], FNP

## 2022-10-03 DIAGNOSIS — I5030 Unspecified diastolic (congestive) heart failure: Secondary | ICD-10-CM | POA: Diagnosis not present

## 2022-10-03 LAB — BRAIN NATRIURETIC PEPTIDE: BNP: 359.6 pg/mL — ABNORMAL HIGH (ref 0.0–100.0)

## 2022-10-03 LAB — CMP14+EGFR
ALT: 14 IU/L (ref 0–44)
AST: 12 IU/L (ref 0–40)
Albumin/Globulin Ratio: 1.7 (ref 1.2–2.2)
Albumin: 3.7 g/dL — ABNORMAL LOW (ref 3.8–4.8)
Alkaline Phosphatase: 97 IU/L (ref 44–121)
BUN/Creatinine Ratio: 12 (ref 10–24)
BUN: 18 mg/dL (ref 8–27)
Bilirubin Total: 0.3 mg/dL (ref 0.0–1.2)
CO2: 22 mmol/L (ref 20–29)
Calcium: 8.7 mg/dL (ref 8.6–10.2)
Chloride: 104 mmol/L (ref 96–106)
Creatinine, Ser: 1.49 mg/dL — ABNORMAL HIGH (ref 0.76–1.27)
Globulin, Total: 2.2 g/dL (ref 1.5–4.5)
Glucose: 175 mg/dL — ABNORMAL HIGH (ref 70–99)
Potassium: 4.9 mmol/L (ref 3.5–5.2)
Sodium: 139 mmol/L (ref 134–144)
Total Protein: 5.9 g/dL — ABNORMAL LOW (ref 6.0–8.5)
eGFR: 49 mL/min/{1.73_m2} — ABNORMAL LOW (ref >59)

## 2022-10-03 LAB — CBC WITH DIFFERENTIAL/PLATELET
Basophils Absolute: 0 10*3/uL (ref 0.0–0.2)
Basos: 1 %
EOS (ABSOLUTE): 0.2 10*3/uL (ref 0.0–0.4)
Eos: 3 %
Hematocrit: 40 % (ref 37.5–51.0)
Hemoglobin: 13 g/dL (ref 13.0–17.7)
Immature Grans (Abs): 0 10*3/uL (ref 0.0–0.1)
Immature Granulocytes: 0 %
Lymphocytes Absolute: 1.2 10*3/uL (ref 0.7–3.1)
Lymphs: 19 %
MCH: 26.4 pg — ABNORMAL LOW (ref 26.6–33.0)
MCHC: 32.5 g/dL (ref 31.5–35.7)
MCV: 81 fL (ref 79–97)
Monocytes Absolute: 0.6 10*3/uL (ref 0.1–0.9)
Monocytes: 10 %
Neutrophils Absolute: 4.1 10*3/uL (ref 1.4–7.0)
Neutrophils: 67 %
Platelets: 399 10*3/uL (ref 150–450)
RBC: 4.92 x10E6/uL (ref 4.14–5.80)
RDW: 15.1 % (ref 11.6–15.4)
WBC: 6.2 10*3/uL (ref 3.4–10.8)

## 2022-10-05 ENCOUNTER — Other Ambulatory Visit: Payer: Self-pay | Admitting: Family

## 2022-10-05 DIAGNOSIS — J439 Emphysema, unspecified: Secondary | ICD-10-CM | POA: Diagnosis not present

## 2022-10-06 DIAGNOSIS — I700 Atherosclerosis of aorta: Secondary | ICD-10-CM | POA: Diagnosis not present

## 2022-10-06 DIAGNOSIS — G4733 Obstructive sleep apnea (adult) (pediatric): Secondary | ICD-10-CM | POA: Diagnosis not present

## 2022-10-06 DIAGNOSIS — I5032 Chronic diastolic (congestive) heart failure: Secondary | ICD-10-CM | POA: Diagnosis not present

## 2022-10-06 DIAGNOSIS — I2510 Atherosclerotic heart disease of native coronary artery without angina pectoris: Secondary | ICD-10-CM | POA: Diagnosis not present

## 2022-10-06 DIAGNOSIS — J439 Emphysema, unspecified: Secondary | ICD-10-CM | POA: Diagnosis not present

## 2022-10-06 DIAGNOSIS — I2690 Septic pulmonary embolism without acute cor pulmonale: Secondary | ICD-10-CM | POA: Diagnosis not present

## 2022-10-06 DIAGNOSIS — E669 Obesity, unspecified: Secondary | ICD-10-CM | POA: Diagnosis not present

## 2022-10-06 DIAGNOSIS — I4892 Unspecified atrial flutter: Secondary | ICD-10-CM | POA: Diagnosis not present

## 2022-10-06 DIAGNOSIS — K219 Gastro-esophageal reflux disease without esophagitis: Secondary | ICD-10-CM | POA: Diagnosis not present

## 2022-10-06 DIAGNOSIS — G8929 Other chronic pain: Secondary | ICD-10-CM | POA: Diagnosis not present

## 2022-10-06 DIAGNOSIS — F419 Anxiety disorder, unspecified: Secondary | ICD-10-CM | POA: Diagnosis not present

## 2022-10-06 DIAGNOSIS — J441 Chronic obstructive pulmonary disease with (acute) exacerbation: Secondary | ICD-10-CM | POA: Diagnosis not present

## 2022-10-06 DIAGNOSIS — F17210 Nicotine dependence, cigarettes, uncomplicated: Secondary | ICD-10-CM | POA: Diagnosis not present

## 2022-10-06 DIAGNOSIS — F32A Depression, unspecified: Secondary | ICD-10-CM | POA: Diagnosis not present

## 2022-10-06 DIAGNOSIS — I82409 Acute embolism and thrombosis of unspecified deep veins of unspecified lower extremity: Secondary | ICD-10-CM | POA: Diagnosis not present

## 2022-10-06 DIAGNOSIS — I252 Old myocardial infarction: Secondary | ICD-10-CM | POA: Diagnosis not present

## 2022-10-06 DIAGNOSIS — I480 Paroxysmal atrial fibrillation: Secondary | ICD-10-CM | POA: Diagnosis not present

## 2022-10-06 DIAGNOSIS — I130 Hypertensive heart and chronic kidney disease with heart failure and stage 1 through stage 4 chronic kidney disease, or unspecified chronic kidney disease: Secondary | ICD-10-CM | POA: Diagnosis not present

## 2022-10-06 DIAGNOSIS — Z7984 Long term (current) use of oral hypoglycemic drugs: Secondary | ICD-10-CM | POA: Diagnosis not present

## 2022-10-06 DIAGNOSIS — M5459 Other low back pain: Secondary | ICD-10-CM | POA: Diagnosis not present

## 2022-10-06 DIAGNOSIS — E782 Mixed hyperlipidemia: Secondary | ICD-10-CM | POA: Diagnosis not present

## 2022-10-06 DIAGNOSIS — Z7901 Long term (current) use of anticoagulants: Secondary | ICD-10-CM | POA: Diagnosis not present

## 2022-10-06 DIAGNOSIS — E1122 Type 2 diabetes mellitus with diabetic chronic kidney disease: Secondary | ICD-10-CM | POA: Diagnosis not present

## 2022-10-06 DIAGNOSIS — N1831 Chronic kidney disease, stage 3a: Secondary | ICD-10-CM | POA: Diagnosis not present

## 2022-10-07 ENCOUNTER — Ambulatory Visit (INDEPENDENT_AMBULATORY_CARE_PROVIDER_SITE_OTHER): Payer: Medicare HMO | Admitting: *Deleted

## 2022-10-07 DIAGNOSIS — J441 Chronic obstructive pulmonary disease with (acute) exacerbation: Secondary | ICD-10-CM

## 2022-10-07 DIAGNOSIS — E1142 Type 2 diabetes mellitus with diabetic polyneuropathy: Secondary | ICD-10-CM

## 2022-10-07 DIAGNOSIS — J449 Chronic obstructive pulmonary disease, unspecified: Secondary | ICD-10-CM | POA: Diagnosis not present

## 2022-10-07 DIAGNOSIS — E1159 Type 2 diabetes mellitus with other circulatory complications: Secondary | ICD-10-CM | POA: Diagnosis not present

## 2022-10-07 NOTE — Patient Instructions (Signed)
Please call the care guide team at [phone] if you need to cancel or reschedule your appointment.   If you are experiencing a Mental Health or Behavioral Health Crisis or need someone to talk to, please call the Suicide and Crisis Lifeline: 988 call the [place] National Suicide Prevention Lifeline: 774-722-1511 or TTY: 618-312-3024 TTY 864-065-0385) to talk to a trained counselor call 1-800-273-TALK (toll free, 24 hour hotline) go to [facility] [address] [place] [phone]) call the [facility]: [phone] call 911   Following is a copy of the CCM Program Consent:  CCM service includes personalized support from designated clinical staff supervised by the physician, including individualized plan of care and coordination with other care providers 24/7 contact phone numbers for assistance for urgent and routine care needs. Service will only be billed when office clinical staff spend 20 minutes or more in a month to coordinate care. Only one practitioner may furnish and bill the service in a calendar month. The patient may stop CCM services at amy time (effective at the end of the month) by phone call to the office staff. The patient will be responsible for cost sharing (co-pay) or up to 20% of the service fee (after annual deductible is met)  Following is a copy of your full provider care plan:   Goals Addressed             This Visit's Progress    CCM (COPD) EXPECTED OUTCOME: MONITOR, SELF-MANAGE AND REDUCE SYMPTOMS OF COPD       Current Barriers:  Knowledge Deficits related to COPD management Chronic Disease Management support and education needs related to COPD, medications Literacy barriers Non-adherence to prescribed medication regimen No Advanced Directives in place- pt declines Patient reports he uses oxygen at 3 liters at hs as needed Per daughter in law [name], pt is set in his ways and not been willing to  change, family has tried and it does no good, not worth the hard feelings it has caused in the past Spoke with patient's ex- wife [name] (lives with pt) who reports pt has cut down to 1/3 ppd cigarettes and is trying " to do better" Per [name], pt has home health working with him since hospitalization several weeks ago for COPD exacerbation, has home health PT, will be having a nurse to teach pt how to use his glucometer, [name] states patient is ready to learn, pt has been walking and seems to have more motivation [name] reports pt went to pharmacy to pickup Eliquis and could not afford, states pt is in donut hole, pt is out of Eliquis and not taking, would like assistance from pharmacist  Planned Interventions: Advised patient to track and manage COPD triggers Advised patient to self assesses COPD action plan zone and make appointment with provider if in the yellow zone for 48 hours without improvement Advised patient to engage in light exercise as tolerated 3-5 days a week to aid in the the management of COPD Provided education about and advised patient to utilize infection prevention strategies to reduce risk of respiratory infection Discussed the importance of adequate rest and management of fatigue with COPD Reinforced COPD action plan with emphasis on yellow zone Reinforced importance of breathing exercises, relaxation in management of COPD Reinforced importance of smoking cessation Reinforced- cannot smoke around oxygen Encouraged [name]/ pt to call primary care provider office ahead of time when needing samples for any medications/ inhalers Message sent to care guide to schedule  an appointment for [facility] pharmacist to outreach pt/ [name] about Eliquis  Symptom Management: Take medications as prescribed   Attend all scheduled provider appointments Call pharmacy for medication refills 3-7 days in advance of running out of medications Attend church or other social activities Perform all self  care activities independently  Call provider office for new concerns or questions  eliminate smoking in my home identify and remove indoor air pollutants limit outdoor activity during cold weather listen for public air quality announcements every day do breathing exercises every day develop a rescue plan eliminate symptom triggers at home follow rescue plan if symptoms flare-up don't eat or exercise right before bedtime eat healthy/prescribed diet: heart healthy, low sodium get at least 7 to 8 hours of sleep at night practice relaxation or meditation daily do breathing exercises at least 2 times each day do exercises in a comfortable position that makes breathing as easy as possible Do not smoke around oxygen Follow COPD action plan- call your doctor early on for change in health status, symptoms Avoid sick people, wear a mask as needed, practice good handwashing Call your doctor's office for medication samples before you run out Pharmacist will be contacting you about Eliquis It is very important that you take all medications including Eliquis as prescribed  Follow Up Plan: Telephone follow up appointment with care management team member scheduled for:  10/31/22 at 945 am       CCM (DIABETES) EXPECTED OUTCOME:  MONITOR, SELF-MANAGE AND REDUCE SYMPTOMS OF DIABETES       Current Barriers:  Knowledge Deficits related to Diabetes management Chronic Disease Management support and education needs related to Diabetes and diet Literacy barriers Non-adherence to prescribed medication regimen No Advanced Directives in place- pt declines information Spoke with patient and ex-wife [name] (per pt request), reports independent with ADL's, cannot read or write and ex-wife and daughter in Company secretary (nurse) provide some assistance, [name] oversees medications and [name] reports pt does not let [name] know when he is out of as needed medications such as albuterol inhaler or xanax.   Patient states he  does not have diabetes, has glucometer but does not check CBG and states , [name] reports pt is not willing to do any of this and does not acknowledge that he has diabetes, [name] now reports pt is willing to learn to use glucometer and states home health RN will be assisting. [name] (daughter in law)  tries to provide oversight with medications but pt will not let her know when he is out of prn medications.   Pt is unable to use a telephone and cannot call in his own refills and [name] will not be responsible for this. Spoke with [name] who reports patient will be learning to use glucometer and hopefully start checking CBG and keeping a log.  Planned Interventions: Reviewed medications with patient and discussed importance of medication adherence;        Counseled on importance of regular laboratory monitoring as prescribed;        Advised patient, providing education and rationale, to check cbg per MD order  and record        call provider for findings outside established parameters;       Reviewed carbohydrate modified diet and food choices  Symptom Management: Take medications as prescribed   Attend all scheduled provider appointments Call pharmacy for medication refills 3-7 days in advance of running out of medications Attend church or other social activities Perform all self care activities  independently  Perform IADL's (shopping, preparing meals, housekeeping, managing finances) independently Call provider office for new concerns or questions  check blood sugar at prescribed times: when you have symptoms of low or high blood sugar and per doctor order  check feet daily for cuts, sores or redness enter blood sugar readings and medication or insulin into daily log take the blood sugar log to all doctor visits take the blood sugar meter to all doctor visits trim toenails straight across fill half of plate with vegetables limit fast food meals to no more than 1 per week manage portion  size prepare main meal at home 3 to 5 days each week read food labels for fat, fiber, carbohydrates and portion size set a realistic goal keep feet up while sitting wash and dry feet carefully every day wear comfortable, cotton socks wear comfortable, well-fitting shoes Call in advance to primary care provider for any medication/ inhalers samples you may need Let your family know when refills are needed so you don't run out of any medication Work with home health nurse on learning how to use glucometer  Follow Up Plan: Telephone follow up appointment with care management team member scheduled for:  10/31/22 at 945 am          Patient verbalizes understanding of instructions and care plan provided today and agrees to view in MyChart. Active MyChart status and patient understanding of how to access instructions and care plan via MyChart confirmed with patient.  Telephone follow up appointment with care management team member scheduled for:  10/31/22 at 945 am

## 2022-10-07 NOTE — Chronic Care Management (AMB) (Signed)
Chronic Care Management   CCM RN Visit Note  10/07/2022 Name: Ronald [name] MRN: [id number] DOB: 20-Nov-1945  Subjective: Ronald [name] is a 77 y.o. year old male who is a primary care patient of Ronald Spencer, FNP. The patient was referred to the Chronic Care Management team for assistance with care management needs subsequent to provider initiation of CCM services and plan of care.    Today's Visit:  Engaged with patient by telephone for follow up visit.        Goals Addressed             This Visit's Progress    CCM (COPD) EXPECTED OUTCOME: MONITOR, SELF-MANAGE AND REDUCE SYMPTOMS OF COPD       Current Barriers:  Knowledge Deficits related to COPD management Chronic Disease Management support and education needs related to COPD, medications Literacy barriers Non-adherence to prescribed medication regimen No Advanced Directives in place- pt declines Patient reports he uses oxygen at 3 liters at hs as needed Per daughter in law Ronald [name], pt is set in his ways and not been willing to change, family has tried and it does no good, not worth the hard feelings it has caused in the past Spoke with patient's ex- wife Ronald [name] (lives with pt) who reports pt has cut down to 1/3 ppd cigarettes and is trying " to do better" Per Ronald [name], pt has home health working with him since hospitalization several weeks ago for COPD exacerbation, has home health PT, will be having a nurse to teach pt how to use his glucometer, Ronald [name] states patient is ready to learn, pt has been walking and seems to have more motivation Ronald [name] reports pt went to pharmacy to pickup Eliquis and could not afford, states pt is in donut hole, pt is out of Eliquis and not taking, would like assistance from pharmacist  Planned Interventions: Advised patient to track and manage COPD triggers Advised patient to self assesses COPD action plan zone and make appointment with provider if in the yellow zone for 48 hours without  improvement Advised patient to engage in light exercise as tolerated 3-5 days a week to aid in the the management of COPD Provided education about and advised patient to utilize infection prevention strategies to reduce risk of respiratory infection Discussed the importance of adequate rest and management of fatigue with COPD Reinforced COPD action plan with emphasis on yellow zone Reinforced importance of breathing exercises, relaxation in management of COPD Reinforced importance of smoking cessation Reinforced- cannot smoke around oxygen Encouraged Ronald [name]/ pt to call primary care provider office ahead of time when needing samples for any medications/ inhalers Message sent to care guide to schedule an appointment for [facility] pharmacist to outreach pt/ Ronald [name] about Eliquis  Symptom Management: Take medications as prescribed   Attend all scheduled provider appointments Call pharmacy for medication refills 3-7 days in advance of running out of medications Attend church or other social activities Perform all self care activities independently  Call provider office for new concerns or questions  eliminate smoking in my home identify and remove indoor air pollutants limit outdoor activity during cold weather listen for public air quality announcements every day do breathing exercises every day develop a rescue plan eliminate symptom triggers at home follow rescue plan if symptoms flare-up don't eat or exercise right before bedtime eat healthy/prescribed diet: heart healthy, low sodium get at least 7 to 8 hours of sleep at night practice relaxation or meditation daily do breathing exercises at  least 2 times each day do exercises in a comfortable position that makes breathing as easy as possible Do not smoke around oxygen Follow COPD action plan- call your doctor early on for change in health status, symptoms Avoid sick people, wear a mask as needed, practice good handwashing Call your  doctor's office for medication samples before you run out Pharmacist will be contacting you about Eliquis It is very important that you take all medications including Eliquis as prescribed  Follow Up Plan: Telephone follow up appointment with care management team member scheduled for:  10/31/22 at 945 am       CCM (DIABETES) EXPECTED OUTCOME:  MONITOR, SELF-MANAGE AND REDUCE SYMPTOMS OF DIABETES       Current Barriers:  Knowledge Deficits related to Diabetes management Chronic Disease Management support and education needs related to Diabetes and diet Literacy barriers Non-adherence to prescribed medication regimen No Advanced Directives in place- pt declines information Spoke with patient and ex-wife Ronald [name] (per pt request), reports independent with ADL's, cannot read or write and ex-wife and daughter in Company secretary (nurse) provide some assistance, Ronald [name] oversees medications and Ronald [name] reports pt does not let Ronald [name] know when he is out of as needed medications such as albuterol inhaler or xanax.   Patient states he does not have diabetes, has glucometer but does not check CBG and states , Ronald [name] reports pt is not willing to do any of this and does not acknowledge that he has diabetes, Ronald [name] now reports pt is willing to learn to use glucometer and states home health RN will be assisting. Ronald [name] (daughter in law)  tries to provide oversight with medications but pt will not let her know when he is out of prn medications.   Pt is unable to use a telephone and cannot call in his own refills and Ronald [name] will not be responsible for this. Spoke with Ronald [name] who reports patient will be learning to use glucometer and hopefully start checking CBG and keeping a log.  Planned Interventions: Reviewed medications with patient and discussed importance of medication adherence;        Counseled on importance of regular laboratory monitoring as prescribed;        Advised patient, providing education and rationale, to check  cbg per MD order  and record        call provider for findings outside established parameters;       Reviewed carbohydrate modified diet and food choices  Symptom Management: Take medications as prescribed   Attend all scheduled provider appointments Call pharmacy for medication refills 3-7 days in advance of running out of medications Attend church or other social activities Perform all self care activities independently  Perform IADL's (shopping, preparing meals, housekeeping, managing finances) independently Call provider office for new concerns or questions  check blood sugar at prescribed times: when you have symptoms of low or high blood sugar and per doctor order  check feet daily for cuts, sores or redness enter blood sugar readings and medication or insulin into daily log take the blood sugar log to all doctor visits take the blood sugar meter to all doctor visits trim toenails straight across fill half of plate with vegetables limit fast food meals to no more than 1 per week manage portion size prepare main meal at home 3 to 5 days each week read food labels for fat, fiber, carbohydrates and portion size set a realistic goal keep feet up while sitting wash and dry feet carefully every day wear  comfortable, cotton socks wear comfortable, well-fitting shoes Call in advance to primary care provider for any medication/ inhalers samples you may need Let your family know when refills are needed so you don't run out of any medication Work with home health nurse on learning how to use glucometer  Follow Up Plan: Telephone follow up appointment with care management team member scheduled for:  10/31/22 at 945 am          Plan:Telephone follow up appointment with care management team member scheduled for:  10/31/22 at 945 am  Irving Shows [facility], BSN RN Case Manager [facility] [phone]

## 2022-10-09 ENCOUNTER — Ambulatory Visit (INDEPENDENT_AMBULATORY_CARE_PROVIDER_SITE_OTHER): Payer: Medicare HMO

## 2022-10-09 DIAGNOSIS — I130 Hypertensive heart and chronic kidney disease with heart failure and stage 1 through stage 4 chronic kidney disease, or unspecified chronic kidney disease: Secondary | ICD-10-CM

## 2022-10-09 DIAGNOSIS — I700 Atherosclerosis of aorta: Secondary | ICD-10-CM | POA: Diagnosis not present

## 2022-10-09 DIAGNOSIS — I5032 Chronic diastolic (congestive) heart failure: Secondary | ICD-10-CM

## 2022-10-09 DIAGNOSIS — F32A Depression, unspecified: Secondary | ICD-10-CM | POA: Diagnosis not present

## 2022-10-09 DIAGNOSIS — I4892 Unspecified atrial flutter: Secondary | ICD-10-CM | POA: Diagnosis not present

## 2022-10-09 DIAGNOSIS — E1122 Type 2 diabetes mellitus with diabetic chronic kidney disease: Secondary | ICD-10-CM | POA: Diagnosis not present

## 2022-10-09 DIAGNOSIS — Z7901 Long term (current) use of anticoagulants: Secondary | ICD-10-CM | POA: Diagnosis not present

## 2022-10-09 DIAGNOSIS — F17210 Nicotine dependence, cigarettes, uncomplicated: Secondary | ICD-10-CM | POA: Diagnosis not present

## 2022-10-09 DIAGNOSIS — N1831 Chronic kidney disease, stage 3a: Secondary | ICD-10-CM

## 2022-10-09 DIAGNOSIS — M5459 Other low back pain: Secondary | ICD-10-CM | POA: Diagnosis not present

## 2022-10-09 DIAGNOSIS — Z7984 Long term (current) use of oral hypoglycemic drugs: Secondary | ICD-10-CM | POA: Diagnosis not present

## 2022-10-09 DIAGNOSIS — J439 Emphysema, unspecified: Secondary | ICD-10-CM

## 2022-10-09 DIAGNOSIS — I2510 Atherosclerotic heart disease of native coronary artery without angina pectoris: Secondary | ICD-10-CM | POA: Diagnosis not present

## 2022-10-09 DIAGNOSIS — E782 Mixed hyperlipidemia: Secondary | ICD-10-CM | POA: Diagnosis not present

## 2022-10-09 DIAGNOSIS — K219 Gastro-esophageal reflux disease without esophagitis: Secondary | ICD-10-CM | POA: Diagnosis not present

## 2022-10-09 DIAGNOSIS — I480 Paroxysmal atrial fibrillation: Secondary | ICD-10-CM

## 2022-10-09 DIAGNOSIS — G8929 Other chronic pain: Secondary | ICD-10-CM | POA: Diagnosis not present

## 2022-10-09 DIAGNOSIS — G4733 Obstructive sleep apnea (adult) (pediatric): Secondary | ICD-10-CM | POA: Diagnosis not present

## 2022-10-09 DIAGNOSIS — J441 Chronic obstructive pulmonary disease with (acute) exacerbation: Secondary | ICD-10-CM

## 2022-10-09 DIAGNOSIS — E669 Obesity, unspecified: Secondary | ICD-10-CM | POA: Diagnosis not present

## 2022-10-09 DIAGNOSIS — I82409 Acute embolism and thrombosis of unspecified deep veins of unspecified lower extremity: Secondary | ICD-10-CM | POA: Diagnosis not present

## 2022-10-09 DIAGNOSIS — I252 Old myocardial infarction: Secondary | ICD-10-CM | POA: Diagnosis not present

## 2022-10-09 DIAGNOSIS — F419 Anxiety disorder, unspecified: Secondary | ICD-10-CM

## 2022-10-09 DIAGNOSIS — I2690 Septic pulmonary embolism without acute cor pulmonale: Secondary | ICD-10-CM | POA: Diagnosis not present

## 2022-10-14 DIAGNOSIS — I4892 Unspecified atrial flutter: Secondary | ICD-10-CM | POA: Diagnosis not present

## 2022-10-14 DIAGNOSIS — Z515 Encounter for palliative care: Secondary | ICD-10-CM | POA: Diagnosis not present

## 2022-10-14 DIAGNOSIS — F32A Depression, unspecified: Secondary | ICD-10-CM | POA: Diagnosis not present

## 2022-10-14 DIAGNOSIS — J449 Chronic obstructive pulmonary disease, unspecified: Secondary | ICD-10-CM | POA: Diagnosis not present

## 2022-10-14 DIAGNOSIS — I82409 Acute embolism and thrombosis of unspecified deep veins of unspecified lower extremity: Secondary | ICD-10-CM | POA: Diagnosis not present

## 2022-10-14 DIAGNOSIS — J439 Emphysema, unspecified: Secondary | ICD-10-CM | POA: Diagnosis not present

## 2022-10-14 DIAGNOSIS — I252 Old myocardial infarction: Secondary | ICD-10-CM | POA: Diagnosis not present

## 2022-10-14 DIAGNOSIS — N1831 Chronic kidney disease, stage 3a: Secondary | ICD-10-CM | POA: Diagnosis not present

## 2022-10-14 DIAGNOSIS — J441 Chronic obstructive pulmonary disease with (acute) exacerbation: Secondary | ICD-10-CM | POA: Diagnosis not present

## 2022-10-14 DIAGNOSIS — K219 Gastro-esophageal reflux disease without esophagitis: Secondary | ICD-10-CM | POA: Diagnosis not present

## 2022-10-14 DIAGNOSIS — J9612 Chronic respiratory failure with hypercapnia: Secondary | ICD-10-CM | POA: Diagnosis not present

## 2022-10-14 DIAGNOSIS — G4733 Obstructive sleep apnea (adult) (pediatric): Secondary | ICD-10-CM | POA: Diagnosis not present

## 2022-10-14 DIAGNOSIS — Z7984 Long term (current) use of oral hypoglycemic drugs: Secondary | ICD-10-CM | POA: Diagnosis not present

## 2022-10-14 DIAGNOSIS — M5459 Other low back pain: Secondary | ICD-10-CM | POA: Diagnosis not present

## 2022-10-14 DIAGNOSIS — I130 Hypertensive heart and chronic kidney disease with heart failure and stage 1 through stage 4 chronic kidney disease, or unspecified chronic kidney disease: Secondary | ICD-10-CM | POA: Diagnosis not present

## 2022-10-14 DIAGNOSIS — I2690 Septic pulmonary embolism without acute cor pulmonale: Secondary | ICD-10-CM | POA: Diagnosis not present

## 2022-10-14 DIAGNOSIS — I480 Paroxysmal atrial fibrillation: Secondary | ICD-10-CM | POA: Diagnosis not present

## 2022-10-14 DIAGNOSIS — Z7901 Long term (current) use of anticoagulants: Secondary | ICD-10-CM | POA: Diagnosis not present

## 2022-10-14 DIAGNOSIS — E782 Mixed hyperlipidemia: Secondary | ICD-10-CM | POA: Diagnosis not present

## 2022-10-14 DIAGNOSIS — E669 Obesity, unspecified: Secondary | ICD-10-CM | POA: Diagnosis not present

## 2022-10-14 DIAGNOSIS — I5032 Chronic diastolic (congestive) heart failure: Secondary | ICD-10-CM | POA: Diagnosis not present

## 2022-10-14 DIAGNOSIS — I2510 Atherosclerotic heart disease of native coronary artery without angina pectoris: Secondary | ICD-10-CM | POA: Diagnosis not present

## 2022-10-14 DIAGNOSIS — F17210 Nicotine dependence, cigarettes, uncomplicated: Secondary | ICD-10-CM | POA: Diagnosis not present

## 2022-10-14 DIAGNOSIS — F419 Anxiety disorder, unspecified: Secondary | ICD-10-CM | POA: Diagnosis not present

## 2022-10-14 DIAGNOSIS — I700 Atherosclerosis of aorta: Secondary | ICD-10-CM | POA: Diagnosis not present

## 2022-10-14 DIAGNOSIS — G8929 Other chronic pain: Secondary | ICD-10-CM | POA: Diagnosis not present

## 2022-10-14 DIAGNOSIS — E1122 Type 2 diabetes mellitus with diabetic chronic kidney disease: Secondary | ICD-10-CM | POA: Diagnosis not present

## 2022-10-14 DIAGNOSIS — J9611 Chronic respiratory failure with hypoxia: Secondary | ICD-10-CM | POA: Diagnosis not present

## 2022-10-18 ENCOUNTER — Other Ambulatory Visit: Payer: Self-pay | Admitting: Family

## 2022-10-18 DIAGNOSIS — F419 Anxiety disorder, unspecified: Secondary | ICD-10-CM

## 2022-10-18 DIAGNOSIS — F1320 Sedative, hypnotic or anxiolytic dependence, uncomplicated: Secondary | ICD-10-CM

## 2022-10-22 ENCOUNTER — Telehealth: Payer: Self-pay | Admitting: Family

## 2022-10-22 DIAGNOSIS — F1320 Sedative, hypnotic or anxiolytic dependence, uncomplicated: Secondary | ICD-10-CM

## 2022-10-22 DIAGNOSIS — F419 Anxiety disorder, unspecified: Secondary | ICD-10-CM

## 2022-10-22 MED ORDER — ALPRAZOLAM 0.25 MG PO TABS
0.2500 mg | ORAL_TABLET | Freq: Two times a day (BID) | ORAL | 2 refills | Status: DC | PRN
Start: 2022-10-22 — End: 2023-02-02

## 2022-10-22 NOTE — Telephone Encounter (Signed)
Did not get filled at last office visit it should have been please advise?

## 2022-10-22 NOTE — Telephone Encounter (Signed)
[name] made aware and understood.

## 2022-10-22 NOTE — Telephone Encounter (Signed)
Xanax refilled.  

## 2022-10-23 ENCOUNTER — Other Ambulatory Visit: Payer: Self-pay | Admitting: Family

## 2022-10-31 ENCOUNTER — Telehealth: Payer: Self-pay | Admitting: *Deleted

## 2022-10-31 ENCOUNTER — Telehealth: Payer: Medicare HMO

## 2022-10-31 NOTE — Telephone Encounter (Signed)
   CCM RN Visit Note   10/31/22 Name: YUYA DELLIS MRN: [id number]      DOB: May 07, 1946  Subjective: AHMAAD WITMER is a 77 y.o. year old male who is a primary care patient of [name] FNP The patient was referred to the Chronic Care Management team for assistance with care management needs subsequent to provider initiation of CCM services and plan of care.      An unsuccessful telephone outreach was attempted today to contact the patient about Chronic Care Management needs.    Plan:Telephone follow up appointment with care management team member scheduled for:  upon care guide rescheduling.  Irving Shows RNC, BSN RN Case Manager [facility] [phone]

## 2022-11-01 ENCOUNTER — Other Ambulatory Visit: Payer: Self-pay | Admitting: Family

## 2022-11-02 DIAGNOSIS — I5030 Unspecified diastolic (congestive) heart failure: Secondary | ICD-10-CM | POA: Diagnosis not present

## 2022-11-04 ENCOUNTER — Other Ambulatory Visit: Payer: Self-pay | Admitting: Family

## 2022-11-04 DIAGNOSIS — J439 Emphysema, unspecified: Secondary | ICD-10-CM | POA: Diagnosis not present

## 2022-11-07 ENCOUNTER — Ambulatory Visit (INDEPENDENT_AMBULATORY_CARE_PROVIDER_SITE_OTHER): Payer: Medicare HMO | Admitting: Pharmacist

## 2022-11-07 DIAGNOSIS — I4892 Unspecified atrial flutter: Secondary | ICD-10-CM

## 2022-11-14 ENCOUNTER — Other Ambulatory Visit: Payer: Self-pay | Admitting: Family

## 2022-11-17 ENCOUNTER — Encounter: Payer: Self-pay | Admitting: *Deleted

## 2022-11-18 ENCOUNTER — Other Ambulatory Visit: Payer: Self-pay | Admitting: Family

## 2022-11-18 DIAGNOSIS — F331 Major depressive disorder, recurrent, moderate: Secondary | ICD-10-CM

## 2022-11-18 DIAGNOSIS — F419 Anxiety disorder, unspecified: Secondary | ICD-10-CM

## 2022-11-20 ENCOUNTER — Other Ambulatory Visit: Payer: Self-pay | Admitting: *Deleted

## 2022-11-20 MED ORDER — BISOPROLOL FUMARATE 5 MG PO TABS: 5.0000 mg | ORAL_TABLET | Freq: Every day | ORAL | 0 refills | Status: DC

## 2022-11-22 ENCOUNTER — Other Ambulatory Visit: Payer: Self-pay | Admitting: Family

## 2022-11-25 DIAGNOSIS — Z515 Encounter for palliative care: Secondary | ICD-10-CM | POA: Diagnosis not present

## 2022-11-25 DIAGNOSIS — J9612 Chronic respiratory failure with hypercapnia: Secondary | ICD-10-CM | POA: Diagnosis not present

## 2022-11-25 DIAGNOSIS — J449 Chronic obstructive pulmonary disease, unspecified: Secondary | ICD-10-CM | POA: Diagnosis not present

## 2022-11-25 DIAGNOSIS — J9611 Chronic respiratory failure with hypoxia: Secondary | ICD-10-CM | POA: Diagnosis not present

## 2022-11-25 DIAGNOSIS — I5032 Chronic diastolic (congestive) heart failure: Secondary | ICD-10-CM | POA: Diagnosis not present

## 2022-11-26 NOTE — Progress Notes (Signed)
   11/07/2022 Name: Ronald [name] MRN: [id number] DOB: 03/24/1946  Eliquis patient assistance  Eliquis remains expensive for patient to purchase at the pharmacy.  Patient does not meet qualifications for [facility] Eliquis patient assistance program (lack of 3% OOP spent).  Eliquis samples left up front for daughter/patient to pick up.  Application for Eliquis patient assistance faxed to [facility] Dept to see if he will qualify.  Patient may have to transition to warfarin if Eliquis not attainable.  Encouraged daughter to call the health dept to check on application in the coming weeks as they will follow from here.  Patient is tolerating Eliquis and denies signs and symptoms of bleeding.    Kieth Brightly, PharmD, BCACP Clinical Pharmacist, [facility]

## 2022-11-28 ENCOUNTER — Telehealth: Payer: Self-pay | Admitting: Nurse Practitioner

## 2022-11-28 DIAGNOSIS — I480 Paroxysmal atrial fibrillation: Secondary | ICD-10-CM

## 2022-11-28 MED ORDER — ELIQUIS 5 MG PO TABS
5.0000 mg | ORAL_TABLET | Freq: Two times a day (BID) | ORAL | 5 refills | Status: DC
Start: 2022-11-28 — End: 2023-02-02

## 2022-11-28 NOTE — Telephone Encounter (Signed)
Eliquis 5mg  refill/sample. Patient is 77 years old, weight-77.1kg, Crea-1.49 on 10/02/22, Diagnosis-Afib, and last seen by Sharlene Dory on 09/19/22. Dose is appropriate based on dosing criteria. Will send in a refill to pharmacy.    Unsure what is needed, therefore sending in a refill and this is the correct eliquis dose if samples are needed. Financial and cost issues are not handled by the Anticoagulation Clinic.

## 2022-11-28 NOTE — Telephone Encounter (Signed)
Patient calling the office for samples of medication:   1.  What medication and dosage are you requesting samples for?  ELIQUIS 5 MG TABS tablet   2.  Are you currently out of this medication?   NO concerned about the cost states that he only has a few left.

## 2022-12-01 NOTE — Telephone Encounter (Signed)
Left message to return call 

## 2022-12-02 NOTE — Telephone Encounter (Signed)
Patient was returning call. Please adbise

## 2022-12-03 DIAGNOSIS — I5030 Unspecified diastolic (congestive) heart failure: Secondary | ICD-10-CM | POA: Diagnosis not present

## 2022-12-03 MED ORDER — APIXABAN 5 MG PO TABS: 5.0000 mg | ORAL_TABLET | Freq: Two times a day (BID) | ORAL | 0 refills | Status: DC

## 2022-12-03 NOTE — Telephone Encounter (Signed)
Returned call to patient. Spoke with [name] (verified on FYI) patient was requesting assistance on medication and samples. Advised her of form he can fill out and will need to include proof of income  and out of pocket expenses from pharmacy. Can have that filled out and wait on approval.

## 2022-12-03 NOTE — Telephone Encounter (Signed)
Samples and application has been left in the sample closet

## 2022-12-05 DIAGNOSIS — J439 Emphysema, unspecified: Secondary | ICD-10-CM | POA: Diagnosis not present

## 2022-12-09 ENCOUNTER — Telehealth: Payer: Self-pay | Admitting: Family

## 2022-12-09 NOTE — Telephone Encounter (Signed)
Pts daughter in law called stating that she is always having issues with getting pts Rx's refilled when they are due. Wants to know if all of pts meds can somehow get on the same schedule to be refilled at the same time? Please advise.

## 2022-12-14 ENCOUNTER — Other Ambulatory Visit: Payer: Self-pay | Admitting: Family

## 2022-12-18 ENCOUNTER — Telehealth: Payer: Medicare HMO

## 2022-12-18 ENCOUNTER — Telehealth: Payer: Self-pay | Admitting: *Deleted

## 2022-12-18 NOTE — Telephone Encounter (Signed)
   CCM RN Visit Note   12/18/22 Name: Ronald [name] MRN: [id number]      DOB: 1946/06/05  Subjective: Ronald [name] is a 77 y.o. year old male who is a primary care patient of [name] FNP. The patient was referred to the Chronic Care Management team for assistance with care management needs subsequent to provider initiation of CCM services and plan of care.      Second unsuccessful telephone outreach was attempted today to contact the patient about Chronic Care Management needs.    Plan:Telephone follow up appointment with care management team member scheduled for:  upon care guide rescheduling.  Irving Shows RNC, BSN RN Case Manager [facility] [phone]

## 2022-12-22 ENCOUNTER — Ambulatory Visit: Payer: Medicare HMO | Admitting: Cardiology
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
## 2022-12-23 ENCOUNTER — Ambulatory Visit: Payer: Medicare HMO | Admitting: Cardiology

## 2022-12-24 NOTE — Telephone Encounter (Signed)
No return call will close call

## 2022-12-30 ENCOUNTER — Telehealth: Payer: Self-pay | Admitting: Nurse Practitioner

## 2022-12-30 NOTE — Telephone Encounter (Signed)
Patient calling the office for samples of medication:   1.  What medication and dosage are you requesting samples for?  -ELIQUIS 5 MG TABS tablet   2.  Are you currently out of this medication? NO, but only have a few left

## 2022-12-31 NOTE — Telephone Encounter (Signed)
Co-pay for eliquis is $133.86 per [facility].

## 2022-12-31 NOTE — Telephone Encounter (Signed)
Advised that elquis samples were not available at this time. Reports BMS-PAF application has not been filled out and has been misplaced. Advised that a new application will be left up front for pick up. Verbalized understanding.

## 2023-01-02 ENCOUNTER — Ambulatory Visit: Payer: Medicare HMO | Admitting: Family

## 2023-01-02 DIAGNOSIS — I5030 Unspecified diastolic (congestive) heart failure: Secondary | ICD-10-CM | POA: Diagnosis not present

## 2023-01-04 DIAGNOSIS — J439 Emphysema, unspecified: Secondary | ICD-10-CM | POA: Diagnosis not present

## 2023-01-05 ENCOUNTER — Ambulatory Visit: Payer: Medicare HMO | Admitting: Family

## 2023-01-05 ENCOUNTER — Other Ambulatory Visit: Payer: Self-pay | Admitting: Family

## 2023-01-05 DIAGNOSIS — I509 Heart failure, unspecified: Secondary | ICD-10-CM

## 2023-01-08 ENCOUNTER — Ambulatory Visit: Payer: Medicare HMO | Admitting: Family

## 2023-01-12 ENCOUNTER — Encounter: Payer: Self-pay | Admitting: Family

## 2023-01-21 ENCOUNTER — Telehealth: Payer: Self-pay | Admitting: *Deleted

## 2023-01-21 ENCOUNTER — Other Ambulatory Visit: Payer: Medicare HMO | Admitting: *Deleted

## 2023-01-21 NOTE — Patient Outreach (Signed)
   CCM RN Visit Note   01/21/23 Name: ETHEL MARA MRN: [id number]      DOB: 1946-05-02  Subjective: KIREE BROD is a 77 y.o. year old male who is a primary care patient of [name] FNP. Telephone outreach for follow up (2nd attempt), male answered the phone and states " we don't know anything about an appointment today", was unable to speak with patient, will attempt to reschedule through care guide.     Second unsuccessful telephone outreach was attempted today to contact the patient about Care Management needs.    Plan:Telephone follow up appointment with care management team member scheduled for:  upon care guide rescheduling.  Irving Shows [facility], BSN [place]/ Ambulatory Care Management [phone]

## 2023-01-26 ENCOUNTER — Other Ambulatory Visit: Payer: Self-pay | Admitting: Family

## 2023-01-28 ENCOUNTER — Other Ambulatory Visit: Payer: Self-pay | Admitting: Family

## 2023-01-30 ENCOUNTER — Other Ambulatory Visit: Payer: Self-pay | Admitting: Nurse Practitioner

## 2023-01-30 DIAGNOSIS — F419 Anxiety disorder, unspecified: Secondary | ICD-10-CM

## 2023-01-30 DIAGNOSIS — F1320 Sedative, hypnotic or anxiolytic dependence, uncomplicated: Secondary | ICD-10-CM

## 2023-02-02 ENCOUNTER — Ambulatory Visit (INDEPENDENT_AMBULATORY_CARE_PROVIDER_SITE_OTHER): Payer: Medicare HMO | Admitting: Family

## 2023-02-02 ENCOUNTER — Encounter: Payer: Self-pay | Admitting: Family

## 2023-02-02 VITALS — BP 152/67 | HR 67 | Temp 97.5°F | Ht 69.0 in | Wt 164.6 lb

## 2023-02-02 DIAGNOSIS — F419 Anxiety disorder, unspecified: Secondary | ICD-10-CM

## 2023-02-02 DIAGNOSIS — I252 Old myocardial infarction: Secondary | ICD-10-CM | POA: Diagnosis not present

## 2023-02-02 DIAGNOSIS — K219 Gastro-esophageal reflux disease without esophagitis: Secondary | ICD-10-CM | POA: Diagnosis not present

## 2023-02-02 DIAGNOSIS — J9611 Chronic respiratory failure with hypoxia: Secondary | ICD-10-CM

## 2023-02-02 DIAGNOSIS — I5033 Acute on chronic diastolic (congestive) heart failure: Secondary | ICD-10-CM

## 2023-02-02 DIAGNOSIS — Z0000 Encounter for general adult medical examination without abnormal findings: Secondary | ICD-10-CM

## 2023-02-02 DIAGNOSIS — I5030 Unspecified diastolic (congestive) heart failure: Secondary | ICD-10-CM | POA: Diagnosis not present

## 2023-02-02 DIAGNOSIS — E1142 Type 2 diabetes mellitus with diabetic polyneuropathy: Secondary | ICD-10-CM

## 2023-02-02 DIAGNOSIS — Z0001 Encounter for general adult medical examination with abnormal findings: Secondary | ICD-10-CM | POA: Diagnosis not present

## 2023-02-02 DIAGNOSIS — F1320 Sedative, hypnotic or anxiolytic dependence, uncomplicated: Secondary | ICD-10-CM | POA: Diagnosis not present

## 2023-02-02 DIAGNOSIS — Z720 Tobacco use: Secondary | ICD-10-CM | POA: Diagnosis not present

## 2023-02-02 DIAGNOSIS — J439 Emphysema, unspecified: Secondary | ICD-10-CM

## 2023-02-02 DIAGNOSIS — F331 Major depressive disorder, recurrent, moderate: Secondary | ICD-10-CM | POA: Diagnosis not present

## 2023-02-02 LAB — BAYER DCA HB A1C WAIVED: HB A1C (BAYER DCA - WAIVED): 6.3 % — ABNORMAL HIGH (ref 4.8–5.6)

## 2023-02-02 MED ORDER — ALPRAZOLAM 0.25 MG PO TABS: 0.2500 mg | ORAL_TABLET | Freq: Two times a day (BID) | ORAL | 2 refills | Status: DC | PRN

## 2023-02-02 NOTE — Patient Instructions (Signed)

## 2023-02-02 NOTE — Progress Notes (Signed)
Subjective:    Patient ID: Ronald [name], male    DOB: June 19, 1945, 77 y.o.   MRN: [id number]  Chief Complaint  Patient presents with   Medical Management of Chronic Issues   Pt presents to the office today for CPE and chronic follow up. He is currently using 2-3 L at night and as needed.  He has COPD. Continues to smoke 1/2 pack a day. Has intermittent SOB.    He is followed by  Cardiologists for A Fib, CHF, and CAD.   Has aortic atherosclerosis and takes Crestor daily.  Hypertension This is a chronic problem. The current episode started more than 1 year ago. The problem has been waxing and waning since onset. The problem is uncontrolled. Associated symptoms include anxiety and shortness of breath. Pertinent negatives include no blurred vision, malaise/fatigue or peripheral edema. Risk factors for coronary artery disease include dyslipidemia, diabetes mellitus, obesity and sedentary lifestyle. The current treatment provides moderate improvement.  Congestive Heart Failure Presents for follow-up visit. Associated symptoms include shortness of breath. Pertinent negatives include no edema or fatigue. The symptoms have been stable.  Gastroesophageal Reflux He complains of belching, heartburn and a hoarse voice. This is a chronic problem. The current episode started more than 1 year ago. The problem occurs occasionally. Pertinent negatives include no fatigue. Risk factors include obesity. He has tried a PPI for the symptoms. The treatment provided moderate relief.  Diabetes He presents for his follow-up diabetic visit. He has type 2 diabetes mellitus. Hypoglycemia symptoms include nervousness/anxiousness. Pertinent negatives for diabetes include no blurred vision and no fatigue. Symptoms are stable. Risk factors for coronary artery disease include dyslipidemia, diabetes mellitus, hypertension, male sex and sedentary lifestyle. (Does not check glucose at home) Eye exam is not current.  Nicotine  Dependence Presents for follow-up visit. Symptoms are negative for fatigue. His urge triggers include company of smokers. The symptoms have been stable. He smokes < 1/2 a pack of cigarettes per day.  Hyperlipidemia This is a chronic problem. The current episode started more than 1 year ago. Associated symptoms include shortness of breath. Current antihyperlipidemic treatment includes statins. The current treatment provides moderate improvement of lipids. Risk factors for coronary artery disease include dyslipidemia, diabetes mellitus, hypertension, a sedentary lifestyle and post-menopausal.  Depression        This is a chronic problem.  The current episode started more than 1 year ago.   The problem occurs intermittently.  Associated symptoms include restlessness.  Associated symptoms include no fatigue, no helplessness, no hopelessness and not sad.  Past medical history includes anxiety.   Anxiety Presents for follow-up visit. Symptoms include excessive worry, nervous/anxious behavior, restlessness and shortness of breath. Symptoms occur occasionally. The severity of symptoms is moderate.        Review of Systems  Constitutional:  Negative for fatigue and malaise/fatigue.  HENT:  Positive for hoarse voice.   Eyes:  Negative for blurred vision.  Respiratory:  Positive for shortness of breath.   Gastrointestinal:  Positive for heartburn.  Psychiatric/Behavioral:  Positive for depression. The patient is nervous/anxious.   All other systems reviewed and are negative.  Family History  Problem Relation Age of Onset   CAD Father    Lung cancer Brother        smoked   Cancer Brother        lung   Leukemia Sister    Dementia Sister    Stroke Mother    Emphysema Sister    Cancer  Brother        lung   Social History   Socioeconomic History   Marital status: Significant Other    Spouse name: [name]   Number of children: 1   Years of education: Not on file   Highest education level:  Not on file  Occupational History   Occupation: Retired    Comment: Chief Technology Officer  Tobacco Use   Smoking status: Every Day    Current packs/day: 0.00    Average packs/day: 0.5 packs/day for 55.4 years (27.7 ttl pk-yrs)    Types: Cigarettes    Start date: 02/12/1966    Last attempt to quit: 07/03/2021    Years since quitting: 1.5    Passive exposure: Never   Smokeless tobacco: Never   Tobacco comments:    smokes 1 ppd  05/20/22  Vaping Use   Vaping status: Never Used  Substance and Sexual Activity   Alcohol use: Not Currently    Alcohol/week: 0.0 standard drinks of alcohol   Drug use: No   Sexual activity: Never  Other Topics Concern   Not on file  Social History Narrative   Staying with his ex-wife, [name]   Has 2 children (today he told me one child 01/15/21)   Social Determinants of Health   Financial Resource Strain: Low Risk  (09/12/2022)   Overall Financial Resource Strain (CARDIA)    Difficulty of Paying Living Expenses: Not very hard  Recent Concern: Financial Resource Strain - Medium Risk (09/10/2022)   Received from [facility]   Overall Financial Resource Strain (CARDIA)    Difficulty of Paying Living Expenses: Somewhat hard  Food Insecurity: No Food Insecurity (09/10/2022)   Received from [facility]   Hunger Vital Sign    Worried About Running Out of Food in the Last Year: Never true    Ran Out of Food in the Last Year: Never true  Transportation Needs: No Transportation Needs (09/12/2022)   PRAPARE - Administrator, Civil Service (Medical): No    Lack of Transportation (Non-Medical): No  Physical Activity: Inactive (05/20/2022)   Exercise Vital Sign    Days of Exercise per Week: 0 days    Minutes of Exercise per Session: 0 min  Stress: No Stress Concern Present (05/20/2022)   [employer] of Occupational Health - Occupational Stress Questionnaire    Feeling of Stress : Only a little  Social Connections:  Moderately Integrated (05/20/2022)   Social Connection and Isolation Panel [NHANES]    Frequency of Communication with Friends and Family: Twice a week    Frequency of Social Gatherings with Friends and Family: Once a week    Attends Religious Services: Never    Database administrator or Organizations: Yes    Attends Banker Meetings: Never    Marital Status: Living with partner        Objective:   Physical Exam Vitals reviewed.  Constitutional:      General: He is not in acute distress.    Appearance: He is well-developed.  HENT:     Head: Normocephalic.     Right Ear: Tympanic membrane normal.     Left Ear: Tympanic membrane normal.  Eyes:     General:        Right eye: No discharge.        Left eye: No discharge.     Pupils: Pupils are equal, round, and reactive to light.  Neck:  Thyroid: No thyromegaly.  Cardiovascular:     Rate and Rhythm: Normal rate and regular rhythm.     Heart sounds: Normal heart sounds. No murmur heard. Pulmonary:     Effort: Pulmonary effort is normal. No respiratory distress.     Breath sounds: Rhonchi present. No wheezing.  Abdominal:     General: Bowel sounds are normal. There is no distension.     Palpations: Abdomen is soft.     Tenderness: There is no abdominal tenderness.  Musculoskeletal:        General: Swelling present. No tenderness. Normal range of motion.     Cervical back: Normal range of motion and neck supple.     Comments: Lymphedema bilateral arms   Skin:    General: Skin is warm and dry.     Findings: No erythema or rash.  Neurological:     Mental Status: He is alert and oriented to person, place, and time.     Cranial Nerves: No cranial nerve deficit.     Deep Tendon Reflexes: Reflexes are normal and symmetric.  Psychiatric:        Behavior: Behavior normal.        Thought Content: Thought content normal.        Judgment: Judgment normal.     BP (!) 152/67   Pulse 67   Temp (!) 97.5 F (36.4  C) (Temporal)   Ht 5\' 9"  (1.753 m)   Wt 164 lb 9.6 oz (74.7 kg)   SpO2 94%   BMI 24.31 kg/m      Assessment & Plan:  PARTHA HORTA comes in today with chief complaint of Medical Management of Chronic Issues   Diagnosis and orders addressed:  1. Annual physical exam - Bayer DCA Hb A1c Waived - CMP14+EGFR - CBC with Differential/Platelet - Lipid panel - PSA, total and free - TSH  2. Acute on chronic diastolic CHF (congestive heart failure) (HCC) - CMP14+EGFR - CBC with Differential/Platelet  3. Anxiety - ALPRAZolam (XANAX) 0.25 MG tablet; Take 1 tablet (0.25 mg total) by mouth 2 (two) times daily as needed for anxiety.  Dispense: 60 tablet; Refill: 2 - CMP14+EGFR - CBC with Differential/Platelet  4. Benzodiazepine dependence (HCC) - ALPRAZolam (XANAX) 0.25 MG tablet; Take 1 tablet (0.25 mg total) by mouth 2 (two) times daily as needed for anxiety.  Dispense: 60 tablet; Refill: 2 - CMP14+EGFR - CBC with Differential/Platelet  5. Chronic respiratory failure with hypoxia (HCC) - AMB Referral to Pharmacy Medication Management - CMP14+EGFR - CBC with Differential/Platelet  6. Pulmonary emphysema, unspecified emphysema type (HCC) - CMP14+EGFR - CBC with Differential/Platelet  7. Moderate episode of recurrent major depressive disorder (HCC) - AMB Referral to Pharmacy Medication Management - CMP14+EGFR - CBC with Differential/Platelet  8. Type 2 diabetes mellitus with diabetic polyneuropathy, without long-term current use of insulin (HCC)  - AMB Referral to Pharmacy Medication Management - Bayer DCA Hb A1c Waived - CMP14+EGFR - CBC with Differential/Platelet - Microalbumin / creatinine urine ratio  9. Gastroesophageal reflux disease without esophagitis - AMB Referral to Pharmacy Medication Management - CMP14+EGFR - CBC with Differential/Platelet  10. History of MI (myocardial infarction) - AMB Referral to Pharmacy Medication Management - CMP14+EGFR - CBC with  Differential/Platelet - PSA, total and free  11. Tobacco abuse  - AMB Referral to Pharmacy Medication Management - CMP14+EGFR - CBC with Differential/Platelet   Labs pending Patient reviewed in [place] controlled database, no flags noted. Contract and drug screen are up to date.  Continue current medications  Referral to Raynelle Fanning to help with Eliquis and Jardiance cost Health Maintenance reviewed Diet and exercise encouraged  Follow up plan: 3 months   [name], FNP

## 2023-02-03 ENCOUNTER — Telehealth: Payer: Self-pay | Admitting: Family

## 2023-02-03 ENCOUNTER — Other Ambulatory Visit: Payer: Self-pay | Admitting: Family

## 2023-02-03 DIAGNOSIS — R972 Elevated prostate specific antigen [PSA]: Secondary | ICD-10-CM

## 2023-02-03 LAB — CMP14+EGFR
ALT: 17 IU/L (ref 0–44)
AST: 18 IU/L (ref 0–40)
Albumin: 3.7 g/dL — ABNORMAL LOW (ref 3.8–4.8)
Alkaline Phosphatase: 98 IU/L (ref 44–121)
BUN/Creatinine Ratio: 18 (ref 10–24)
BUN: 31 mg/dL — ABNORMAL HIGH (ref 8–27)
Bilirubin Total: 0.3 mg/dL (ref 0.0–1.2)
CO2: 25 mmol/L (ref 20–29)
Calcium: 9.3 mg/dL (ref 8.6–10.2)
Chloride: 102 mmol/L (ref 96–106)
Creatinine, Ser: 1.7 mg/dL — ABNORMAL HIGH (ref 0.76–1.27)
Globulin, Total: 2.5 g/dL (ref 1.5–4.5)
Glucose: 115 mg/dL — ABNORMAL HIGH (ref 70–99)
Potassium: 4.7 mmol/L (ref 3.5–5.2)
Sodium: 141 mmol/L (ref 134–144)
Total Protein: 6.2 g/dL (ref 6.0–8.5)
eGFR: 42 mL/min/{1.73_m2} — ABNORMAL LOW (ref >59)

## 2023-02-03 LAB — MICROALBUMIN / CREATININE URINE RATIO
Creatinine, Urine: 46.7 mg/dL
Microalb/Creat Ratio: 13 mg/g{creat} (ref 0–29)
Microalbumin, Urine: 6 ug/mL

## 2023-02-03 LAB — PSA, TOTAL AND FREE
PSA, Free Pct: 11.5 %
PSA, Free: 1.42 ng/mL
Prostate Specific Ag, Serum: 12.4 ng/mL — ABNORMAL HIGH (ref 0.0–4.0)

## 2023-02-03 LAB — CBC WITH DIFFERENTIAL/PLATELET
Basophils Absolute: 0.1 10*3/uL (ref 0.0–0.2)
Basos: 1 %
EOS (ABSOLUTE): 0.1 10*3/uL (ref 0.0–0.4)
Eos: 1 %
Hematocrit: 44.3 % (ref 37.5–51.0)
Hemoglobin: 13.7 g/dL (ref 13.0–17.7)
Immature Grans (Abs): 0 10*3/uL (ref 0.0–0.1)
Immature Granulocytes: 0 %
Lymphocytes Absolute: 1.1 10*3/uL (ref 0.7–3.1)
Lymphs: 12 %
MCH: 25.9 pg — ABNORMAL LOW (ref 26.6–33.0)
MCHC: 30.9 g/dL — ABNORMAL LOW (ref 31.5–35.7)
MCV: 84 fL (ref 79–97)
Monocytes Absolute: 0.9 10*3/uL (ref 0.1–0.9)
Monocytes: 10 %
Neutrophils Absolute: 7 10*3/uL (ref 1.4–7.0)
Neutrophils: 76 %
Platelets: 393 10*3/uL (ref 150–450)
RBC: 5.29 x10E6/uL (ref 4.14–5.80)
RDW: 14.8 % (ref 11.6–15.4)
WBC: 9.1 10*3/uL (ref 3.4–10.8)

## 2023-02-03 LAB — LIPID PANEL
Chol/HDL Ratio: 2.4 ratio (ref 0.0–5.0)
Cholesterol, Total: 106 mg/dL (ref 100–199)
HDL: 44 mg/dL (ref >39)
LDL Chol Calc (NIH): 39 mg/dL (ref 0–99)
Triglycerides: 131 mg/dL (ref 0–149)
VLDL Cholesterol Cal: 23 mg/dL (ref 5–40)

## 2023-02-03 LAB — TSH: TSH: 0.617 u[IU]/mL (ref 0.450–4.500)

## 2023-02-03 MED ORDER — MOLNUPIRAVIR EUA 200MG CAPSULE: 4.0000 | ORAL_CAPSULE | Freq: Two times a day (BID) | ORAL | 0 refills | Status: AC

## 2023-02-03 NOTE — Telephone Encounter (Signed)
Patient aware.

## 2023-02-03 NOTE — Telephone Encounter (Signed)
Insurance will not cover molnupiravir EUA (LAGEVRIO) 200 mg CAPS capsule but will cover paxlovid. Can we send?

## 2023-02-03 NOTE — Telephone Encounter (Signed)
Patient aware to call back and let us know if he is + or - for at home covid test

## 2023-02-03 NOTE — Telephone Encounter (Signed)
Patient calling to talk to PCP nurse about COVID test. Please call back and advise.

## 2023-02-03 NOTE — Addendum Note (Signed)
Addended by: [name] A on: 02/03/2023 04:36 PM   Modules accepted: Orders

## 2023-02-03 NOTE — Telephone Encounter (Signed)
  Incoming Patient Call  02/03/2023  What symptoms do you have? Cough and chest congestion  How long have you been sick? Just got sick over night  Have you been seen for this problem? No pt was seen yesterday by Neysa Bonito and mentioned caregiver has Covid and now he is having symptoms wants to know if antibiotic/prednisone can be prescribed? Pt has not taken a COVID test   If your provider decides to give you a prescription, which pharmacy would you like for it to be sent to? [facility]    Patient informed that this information will be sent to the clinical staff for review and that they should receive a follow up call.

## 2023-02-03 NOTE — Telephone Encounter (Signed)
Molnupiravir Prescription sent to pharmacy, COVID positive, rest, force fluids, tylenol as needed, Quarantine for at least 5 days and you are fever free, then must wear a mask out in public from day 6-10, report any worsening symptoms such as increased shortness of breath, swelling, or continued high fevers. Possible adverse effects discussed with antivirals.    [name], FNP

## 2023-02-03 NOTE — Telephone Encounter (Signed)
Please have patient take COVID test.   [name], FNP

## 2023-02-03 NOTE — Telephone Encounter (Signed)
Patient at home test is +. Per [facility] she sending med into [facility]

## 2023-02-04 ENCOUNTER — Telehealth: Payer: Self-pay

## 2023-02-04 ENCOUNTER — Other Ambulatory Visit (HOSPITAL_COMMUNITY): Payer: Self-pay

## 2023-02-04 ENCOUNTER — Ambulatory Visit: Payer: Medicare HMO

## 2023-02-04 VITALS — Ht 69.0 in | Wt 164.0 lb

## 2023-02-04 DIAGNOSIS — Z0000 Encounter for general adult medical examination without abnormal findings: Secondary | ICD-10-CM | POA: Diagnosis not present

## 2023-02-04 DIAGNOSIS — J439 Emphysema, unspecified: Secondary | ICD-10-CM | POA: Diagnosis not present

## 2023-02-04 NOTE — Telephone Encounter (Signed)
Can covering provider send Paxlovid in for patient since the other covid medicine that PCP sent in, isnt covered by his insurance? Daughter is calling and asking for it to be sent in to pharmacy today.

## 2023-02-04 NOTE — Telephone Encounter (Signed)
Can a STAT PA be done on this?

## 2023-02-04 NOTE — Progress Notes (Signed)
   Care Guide Note  02/04/2023 Name: Ronald [name] MRN: [id number] DOB: [birth year]  Referred by: [name], FNP Reason for referral : Care Coordination (Outreach to schedule with Pharm d )   Ronald [name] is a 77 y.o. year old male who is a primary care patient of [name], FNP. [name] was referred to the pharmacist for assistance related to HTN, HLD, and DM.    An unsuccessful telephone outreach was attempted today to contact the patient who was referred to the pharmacy team for assistance with medication management. Additional attempts will be made to contact the patient.   Penne Lash, RMA Care Guide [facility]  [place], [place] [address] Direct Dial: [phone] [email or web address]

## 2023-02-04 NOTE — Telephone Encounter (Signed)
Ronald [name] patient. Can this be changed?

## 2023-02-04 NOTE — Telephone Encounter (Signed)
He is not a good candidate for Paxlovid due to his kidney issues and other medications he is on.  Please run prior Auth for [employer]

## 2023-02-04 NOTE — Progress Notes (Signed)
Subjective:   Ronald [name] is a 77 y.o. male who presents for Medicare Annual/Subsequent preventive examination.  Visit Complete: Virtual  I connected with  Ronald [name] on 02/04/23 by a audio enabled telemedicine application and verified that I am speaking with the correct person using two identifiers.  Patient Location: Home  Provider Location: Home Office  I discussed the limitations of evaluation and management by telemedicine. The patient expressed understanding and agreed to proceed.  Patient Medicare AWV questionnaire was completed by the patient on 02/04/2023; I have confirmed that all information answered by patient is correct and no changes since this date.  Review of Systems    Vital Signs: Unable to obtain new vitals due to this being a telehealth visit.  Cardiac Risk Factors include: advanced age (>74men, >66 women);smoking/ tobacco exposure;male gender;dyslipidemia;diabetes mellitus;hypertension Nutrition Risk Assessment:  Has the patient had any N/V/D within the last 2 months?  No  Does the patient have any non-healing wounds?  No  Has the patient had any unintentional weight loss or weight gain?  No   Diabetes:  Is the patient diabetic?  Yes  If diabetic, was a CBG obtained today?  No  Did the patient bring in their glucometer from home?  No  How often do you monitor your CBG's? Weekly .   Financial Strains and Diabetes Management:  Are you having any financial strains with the device, your supplies or your medication? No .  Does the patient want to be seen by Chronic Care Management for management of their diabetes?  No  Would the patient like to be referred to a Nutritionist or for Diabetic Management?  No   Diabetic Exams:  Diabetic Eye Exam: Overdue for diabetic eye exam. Pt has been advised about the importance in completing this exam. Patient advised to call and schedule an eye exam. Diabetic Foot Exam: Overdue, Pt has been advised about the  importance in completing this exam. Pt is scheduled for diabetic foot exam on next office visit .     Objective:    Today's Vitals   02/04/23 1135  Weight: 164 lb (74.4 kg)  Height: 5\' 9"  (1.753 m)   Body mass index is 24.22 kg/m.     02/04/2023   11:39 AM 03/17/2022    1:23 PM 01/16/2022   10:52 AM 12/06/2021   12:13 PM 11/23/2021    3:38 PM 11/22/2021    8:24 PM 08/31/2021    1:35 PM  Advanced Directives  Does Patient Have a Medical Advance Directive? Yes No Yes No  No No  Type of Estate agent of [place];Living will  Healthcare Power of [place];Living will      Does patient want to make changes to medical advance directive? No - Patient declined  No - Patient declined      Copy of Healthcare Power of Attorney in Chart? Yes - validated most recent copy scanned in chart (See row information)  Yes - validated most recent copy scanned in chart (See row information)      Would patient like information on creating a medical advance directive?  No - Patient declined  No - Patient declined No - Patient declined  No - Patient declined    Current Medications (verified) Outpatient Encounter Medications as of 02/04/2023  Medication Sig   acetaminophen (TYLENOL) 325 MG tablet Take 2 tablets (650 mg total) by mouth every 6 (six) hours as needed for mild pain (or Fever >/= 101).  albuterol (VENTOLIN HFA) 108 (90 Base) MCG/ACT inhaler INHALE 2 PUFFS BY MOUTH EVERY 6 HOURS AS NEEDED FOR WHEEZING FOR SHORTNESS OF BREATH   ALPRAZolam (XANAX) 0.25 MG tablet Take 1 tablet (0.25 mg total) by mouth 2 (two) times daily as needed for anxiety.   amiodarone (PACERONE) 200 MG tablet Take 1 tablet by mouth once daily   apixaban (ELIQUIS) 5 MG TABS tablet Take 1 tablet (5 mg total) by mouth 2 (two) times daily.   bisoprolol (ZEBETA) 5 MG tablet Take 1 tablet (5 mg total) by mouth daily. In Place of Metoprolol   budesonide (PULMICORT) 0.5 MG/2ML nebulizer solution Take 2 mLs (0.5 mg total)  by nebulization 2 (two) times daily.   buPROPion (WELLBUTRIN XL) 150 MG 24 hr tablet Take 1 tablet by mouth once daily   busPIRone (BUSPAR) 7.5 MG tablet Take 1 tablet (7.5 mg total) by mouth 3 (three) times daily as needed.   Fluticasone-Umeclidin-Vilant (TRELEGY ELLIPTA) 200-62.5-25 MCG/ACT AEPB Inhale into the lungs.   furosemide (LASIX) 40 MG tablet Take 1 tablet (40 mg total) by mouth 2 (two) times daily.   glucose blood (ONE TOUCH ULTRA TEST) test strip USE TO CHECK GLUCOSE ONCE DAILY   ipratropium-albuterol (DUONEB) 0.5-2.5 (3) MG/3ML SOLN Take 3 mLs by nebulization every 6 (six) hours as needed (asthma).   isosorbide mononitrate (IMDUR) 30 MG 24 hr tablet Take 1 tablet (30 mg total) by mouth daily.   JARDIANCE 25 MG TABS tablet TAKE 1 TABLET BY MOUTH ONCE DAILY BEFORE BREAKFAST   molnupiravir EUA (LAGEVRIO) 200 mg CAPS capsule Take 4 capsules (800 mg total) by mouth 2 (two) times daily for 5 days.   nitroGLYCERIN (NITROSTAT) 0.4 MG SL tablet Place 1 tablet (0.4 mg total) under the tongue every 5 (five) minutes as needed for chest pain.   omeprazole (PRILOSEC) 20 MG capsule Take 1 capsule (20 mg total) by mouth daily.   OXYGEN Inhale 3 L into the lungs at bedtime as needed (shortness of breath).   polyethylene glycol (MIRALAX / GLYCOLAX) 17 g packet Take 17 g by mouth daily.   potassium chloride SA (KLOR-CON M20) 20 MEQ tablet Take 1 tablet by mouth twice daily   rosuvastatin (CRESTOR) 20 MG tablet Take 1 tablet by mouth once daily   No facility-administered encounter medications on file as of 02/04/2023.    Allergies (verified) Gabapentin and Metformin and related   History: Past Medical History:  Diagnosis Date   Anxiety    Asthma    Atrial flutter (HCC) 04/2021   Chronic diastolic CHF (congestive heart failure) (HCC)    Chronic lower back pain    COPD (chronic obstructive pulmonary disease) (HCC)    Coronary artery disease    a. NSTEMI 05/2014 s/p DESx2 to LAD at [facility].    Depression    GERD (gastroesophageal reflux disease)    High cholesterol    History of tracheostomy    Hypertension    NSTEMI (non-ST elevated myocardial infarction) (HCC) 05/2014   with stent placement   PAF (paroxysmal atrial fibrillation) (HCC)    Sleep apnea    Stroke (HCC) 2017   anyeusym    TIA (transient ischemic attack)    "they say I've had some mini strokes; don't know when"; denies residual on 06/22/2014)   Tobacco abuse    Type II diabetes mellitus (HCC)    Ulcerative colitis (HCC)    Past Surgical History:  Procedure Laterality Date   APPENDECTOMY     BIOPSY  07/20/2020   Procedure: BIOPSY;  Surgeon: Dolores Frame, MD;  [facility];  Service: Gastroenterology;;   CARDIAC CATHETERIZATION  [phone] X 3   CHOLECYSTECTOMY     COLONOSCOPY WITH PROPOFOL N/A 07/20/2020   Procedure: COLONOSCOPY WITH PROPOFOL;  Surgeon: Dolores Frame, MD;  [facility];  Service: Gastroenterology;  Laterality: N/A;  1:15   CORONARY ANGIOPLASTY WITH STENT PLACEMENT  05/2014   "2"   ESOPHAGEAL DILATION N/A 07/20/2020   Procedure: ESOPHAGEAL DILATION;  Surgeon: Dolores Frame, MD;  [facility];  Service: Gastroenterology;  Laterality: N/A;   ESOPHAGOGASTRODUODENOSCOPY (EGD) WITH PROPOFOL N/A 07/20/2020   Procedure: ESOPHAGOGASTRODUODENOSCOPY (EGD) WITH PROPOFOL;  Surgeon: Dolores Frame, MD;  [facility];  Service: Gastroenterology;  Laterality: N/A;   IR GASTROSTOMY TUBE MOD SED  06/04/2021   IR GASTROSTOMY TUBE REMOVAL  07/24/2021   LEFT HEART CATH AND CORONARY ANGIOGRAPHY N/A 05/25/2020   Procedure: LEFT HEART CATH AND CORONARY ANGIOGRAPHY;  Surgeon: [place], Peter M, MD;  Location: [facility] INVASIVE CV LAB;  Service: Cardiovascular;  Laterality: N/A;   POLYPECTOMY  07/20/2020   Procedure: POLYPECTOMY INTESTINAL;  Surgeon: Dolores Frame, MD;  [facility];  Service: Gastroenterology;;   TUMOR  EXCISION Right ~ 1999   "side of my upper head"   Family History  Problem Relation Age of Onset   CAD Father    Lung cancer Brother        smoked   Cancer Brother        lung   Leukemia Sister    Dementia Sister    Stroke Mother    Emphysema Sister    Cancer Brother        lung   Social History   Socioeconomic History   Marital status: Significant Other    Spouse name: [name]   Number of children: 1   Years of education: Not on file   Highest education level: Not on file  Occupational History   Occupation: Retired    Comment: Chief Technology Officer  Tobacco Use   Smoking status: Every Day    Current packs/day: 0.00    Average packs/day: 0.5 packs/day for 55.4 years (27.7 ttl pk-yrs)    Types: Cigarettes    Start date: 02/12/1966    Last attempt to quit: 07/03/2021    Years since quitting: 1.5    Passive exposure: Never   Smokeless tobacco: Never   Tobacco comments:    smokes 1 ppd  05/20/22  Vaping Use   Vaping status: Never Used  Substance and Sexual Activity   Alcohol use: Not Currently    Alcohol/week: 0.0 standard drinks of alcohol   Drug use: No   Sexual activity: Never  Other Topics Concern   Not on file  Social History Narrative   Staying with his ex-wife, [name]   Has 2 children (today he told me one child 01/15/21)   Social Determinants of Health   Financial Resource Strain: Low Risk  (02/04/2023)   Overall Financial Resource Strain (CARDIA)    Difficulty of Paying Living Expenses: Not hard at all  Food Insecurity: No Food Insecurity (02/04/2023)   Hunger Vital Sign    Worried About Running Out of Food in the Last Year: Never true    Ran Out of Food in the Last Year: Never true  Transportation Needs: No Transportation Needs (02/04/2023)   PRAPARE - Administrator, Civil Service (Medical): No  Lack of Transportation (Non-Medical): No  Physical Activity: Inactive (02/04/2023)   Exercise Vital Sign    Days of Exercise per Week: 0 days    Minutes of  Exercise per Session: 0 min  Stress: No Stress Concern Present (02/04/2023)   [employer] of Occupational Health - Occupational Stress Questionnaire    Feeling of Stress : Not at all  Social Connections: Socially Isolated (02/04/2023)   Social Connection and Isolation Panel [NHANES]    Frequency of Communication with Friends and Family: More than three times a week    Frequency of Social Gatherings with Friends and Family: More than three times a week    Attends Religious Services: Never    Database administrator or Organizations: No    Attends Engineer, structural: Never    Marital Status: Divorced    Tobacco Counseling Ready to quit: No Counseling given: Not Answered Tobacco comments: smokes 1 ppd  05/20/22   Clinical Intake:  Pre-visit preparation completed: Yes  Pain : No/denies pain     Nutritional Risks: None Diabetes: Yes CBG done?: No Did pt. bring in CBG monitor from home?: No  How often do you need to have someone help you when you read instructions, pamphlets, or other written materials from your doctor or pharmacy?: 1 - Never  Interpreter Needed?: No  Information entered by :: Renie Ora, LPN   Activities of Daily Living    02/04/2023   11:39 AM  In your present state of health, do you have any difficulty performing the following activities:  Hearing? 0  Vision? 0  Difficulty concentrating or making decisions? 0  Walking or climbing stairs? 0  Dressing or bathing? 0  Doing errands, shopping? 0  Preparing Food and eating ? N  Using the Toilet? N  In the past six months, have you accidently leaked urine? N  Do you have problems with loss of bowel control? N  Managing your Medications? N  Managing your Finances? N  Housekeeping or managing your Housekeeping? N    Patient Care Team: [name], FNP as PCP - General (Family Medicine) Danella Maiers, [facility] (Pharmacist) Michaelle Copas, MD as Referring Physician  (Optometry) Marcine Matar, MD as Consulting Physician (Urology) Early, Kristen Loader, MD (Inactive) as Consulting Physician (Vascular Surgery) Audrie Gallus, RN as [facility] Care Management  Indicate any recent Medical Services you may have received from other than [facility] providers in the past year (date may be approximate).     Assessment:   This is a routine wellness examination for [place].  Hearing/Vision screen Vision Screening - Comments:: Unable to go to appointment due health issues.  Dietary issues and exercise activities discussed:     Goals Addressed             This Visit's Progress    DIET - INCREASE WATER INTAKE         Depression Screen    02/04/2023   11:38 AM 02/02/2023   11:08 AM 05/26/2022   12:05 PM 05/20/2022    1:02 PM 01/16/2022   10:46 AM 07/05/2021    3:10 PM 01/17/2021   11:33 AM  PHQ 2/9 Scores  PHQ - 2 Score 0 0 0 0 4 0 2  PHQ- 9 Score 0 0 8  12 3 12     Fall Risk    02/04/2023   11:36 AM 10/02/2022    3:50 PM 05/26/2022   12:04 PM 05/20/2022  1:23 PM 01/24/2022    2:26 PM  Fall Risk   Falls in the past year? 0 0 0 0 0  Number falls in past yr: 0 0     Injury with Fall? 0 0     Risk for fall due to : No Fall Risks No Fall Risks     Follow up Falls prevention discussed Falls evaluation completed       MEDICARE RISK AT HOME: Medicare Risk at Home Any stairs in or around the home?: No If so, are there any without handrails?: No Home free of loose throw rugs in walkways, pet beds, electrical cords, etc?: Yes Adequate lighting in your home to reduce risk of falls?: Yes Life alert?: No Use of a cane, walker or w/c?: No Grab bars in the bathroom?: Yes Shower chair or bench in shower?: Yes Elevated toilet seat or a handicapped toilet?: Yes  TIMED UP AND GO:  Was the test performed?  No    Cognitive Function:        02/04/2023   11:40 AM 01/16/2022   10:53 AM 01/04/2020    1:58 PM 12/24/2018    1:34 PM  6CIT Screen   What Year? 0 points 4 points 0 points 4 points  What month? 0 points 0 points 0 points 0 points  What time? 0 points 0 points 0 points 0 points  Count back from 20 0 points 0 points 0 points 0 points  Months in reverse 2 points 4 points 2 points 2 points  Repeat phrase 4 points 2 points 2 points 10 points  Total Score 6 points 10 points 4 points 16 points    Immunizations Immunization History  Administered Date(s) Administered   Fluad Quad(high Dose 65+) 04/01/2019   Influenza Split 03/09/2014   Influenza,inj,Quad PF,6+ Mos 06/28/2021   Influenza-Unspecified 08/20/2016   Pneumococcal Conjugate-13 11/19/2017   Pneumococcal Polysaccharide-23 08/20/2016   Tdap 10/06/1996, 06/13/2001, 09/12/2020    TDAP status: Up to date  Flu Vaccine status: Up to date  Pneumococcal vaccine status: Up to date  Covid-19 vaccine status: Declined, Education has been provided regarding the importance of this vaccine but patient still declined. Advised may receive this vaccine at local pharmacy or Health Dept.or vaccine clinic. Aware to provide a copy of the vaccination record if obtained from local pharmacy or Health Dept. Verbalized acceptance and understanding.  Qualifies for Shingles Vaccine? Yes   Zostavax completed No   Shingrix Completed?: No.    Education has been provided regarding the importance of this vaccine. Patient has been advised to call insurance company to determine out of pocket expense if they have not yet received this vaccine. Advised may also receive vaccine at local pharmacy or Health Dept. Verbalized acceptance and understanding.  Screening Tests Health Maintenance  Topic Date Due   Lung Cancer Screening  07/10/2022   COVID-19 Vaccine (1) 02/18/2023 (Originally 02/12/1951)   Zoster Vaccines- Shingrix (1 of 2) 05/05/2023 (Originally 02/13/1965)   INFLUENZA VACCINE  09/07/2023 (Originally 01/08/2023)   FOOT EXAM  05/27/2023   OPHTHALMOLOGY EXAM  06/21/2023   Colonoscopy   07/21/2023   HEMOGLOBIN A1C  08/05/2023   Diabetic kidney evaluation - eGFR measurement  02/02/2024   Diabetic kidney evaluation - Urine ACR  02/02/2024   Medicare Annual Wellness (AWV)  02/04/2024   DTaP/Tdap/Td (4 - Td or Tdap) 09/13/2030   Pneumonia Vaccine 8+ Years old  Completed   Hepatitis C Screening  Completed   HPV VACCINES  Aged  Out    Health Maintenance  Health Maintenance Due  Topic Date Due   Lung Cancer Screening  07/10/2022    Colorectal cancer screening: Type of screening: Colonoscopy. Completed 07/20/2020. Repeat every 3 years  Lung Cancer Screening: (Low Dose CT Chest recommended if Age 11-80 years, 20 pack-year currently smoking OR have quit w/in 15years.) does qualify.   Lung Cancer Screening Referral: referral 07/07/2022  Additional Screening:  Hepatitis C Screening: does not qualify; Completed 02/14/2016  Vision Screening: Recommended annual ophthalmology exams for early detection of glaucoma and other disorders of the eye. Is the patient up to date with their annual eye exam?  No  Who is the provider or what is the name of the office in which the patient attends annual eye exams? Unable to due to health issues per patient  If pt is not established with a provider, would they like to be referred to a provider to establish care? No .   Dental Screening: Recommended annual dental exams for proper oral hygiene    Community Resource Referral / Chronic Care Management: CRR required this visit?  No   CCM required this visit?  No     Plan:     I have personally reviewed and noted the following in the patient's chart:   Medical and social history Use of alcohol, tobacco or illicit drugs  Current medications and supplements including opioid prescriptions. Patient is not currently taking opioid prescriptions. Functional ability and status Nutritional status Physical activity Advanced directives List of other physicians Hospitalizations, surgeries,  and ER visits in previous 12 months Vitals Screenings to include cognitive, depression, and falls Referrals and appointments  In addition, I have reviewed and discussed with patient certain preventive protocols, quality metrics, and best practice recommendations. A written personalized care plan for preventive services as well as general preventive health recommendations were provided to patient.     Lorrene Reid, LPN   1/61/0960   After Visit Summary: (MyChart) Due to this being a telephonic visit, the after visit summary with patients personalized plan was offered to patient via MyChart   Nurse Notes: none

## 2023-02-04 NOTE — Patient Instructions (Signed)
Ronald [name] , Thank you for taking time to come for your Medicare Wellness Visit. I appreciate your ongoing commitment to your health goals. Please review the following plan we discussed and let me know if I can assist you in the future.   Referrals/Orders/Follow-Ups/Clinician Recommendations: Aim for 30 minutes of exercise or brisk walking, 6-8 glasses of water, and 5 servings of fruits and vegetables each day.   This is a list of the screening recommended for you and due dates:  Health Maintenance  Topic Date Due   Screening for Lung Cancer  07/10/2022   COVID-19 Vaccine (1) 02/18/2023*   Zoster (Shingles) Vaccine (1 of 2) 05/05/2023*   Flu Shot  09/07/2023*   Complete foot exam   05/27/2023   Eye exam for diabetics  06/21/2023   Colon Cancer Screening  07/21/2023   Hemoglobin A1C  08/05/2023   Yearly kidney function blood test for diabetes  02/02/2024   Yearly kidney health urinalysis for diabetes  02/02/2024   Medicare Annual Wellness Visit  02/04/2024   DTaP/Tdap/Td vaccine (4 - Td or Tdap) 09/13/2030   Pneumonia Vaccine  Completed   Hepatitis C Screening  Completed   HPV Vaccine  Aged Out  *Topic was postponed. The date shown is not the original due date.    Advanced directives: (In Chart) A copy of your advanced directives are scanned into your chart should your provider ever need it.  Next Medicare Annual Wellness Visit scheduled for next year: Yes  insert Preventive Care attachment Insert FALL PREVENTION attachment if needed

## 2023-02-04 NOTE — Telephone Encounter (Signed)
Patient's daughter, Ronald [name], sent message through [facility] email.  I am forwarding this to the pool.  Message is below from her:   The prescription to treat Covid that was sent into [facility] is not the brand his insurance covers. It is $1,100 out of pocket. The paxlovid is covered but when the pharmacy called they were told his provider had already left for the day.  I attempted to call the on call provider through your office, but I was never connected with anyone. I hung up after a few minutes of hold music and decided to try sending this email.  Could someone please call in the paxlovid to [facility] so he can start his treatment asap?  Thank you.  Ronald [name] [phone]

## 2023-02-04 NOTE — Telephone Encounter (Signed)
Received approval from insurance for original rx. Daughter notified and verbalized understanding. Will go to pharmacy and pick up

## 2023-02-04 NOTE — Telephone Encounter (Signed)
Urgent PA request has been Submitted. New Encounter created for follow up. For additional info see Pharmacy Prior Auth telephone encounter from 02/04/23.

## 2023-02-04 NOTE — Telephone Encounter (Signed)
Pharmacy Patient Advocate Encounter  Received notification from [facility] that Prior Authorization for Lagevrio 200MG  capsules has been APPROVED from 06/09/22 to 06/09/23. Ran test claim, Copay is $195.49. This test claim was processed through [facility]- copay amounts may vary at other pharmacies due to pharmacy/plan contracts, or as the patient moves through the different stages of their insurance plan.   PA #/Case ID/Reference #: [id number]

## 2023-02-04 NOTE — Telephone Encounter (Signed)
Pharmacy Patient Advocate Encounter   Received notification from Pt Calls Messages that prior authorization for Lagevrio 200MG  capsules is required/requested.   Insurance verification completed.   The patient is insured through [facility] .   Per test claim: PA required; PA submitted to [facility] via CoverMyMeds Key/confirmation #/EOC [id number] Status is pending

## 2023-02-04 NOTE — Telephone Encounter (Signed)
Patient's daughter notified.

## 2023-02-10 ENCOUNTER — Other Ambulatory Visit: Payer: Self-pay | Admitting: Family

## 2023-02-11 ENCOUNTER — Other Ambulatory Visit: Payer: Self-pay | Admitting: Family

## 2023-02-11 NOTE — Telephone Encounter (Signed)
  Prescription Request  02/11/2023  Is this a "Controlled Substance" medicine? No    Have you seen your PCP in the last 2 weeks? yes  If YES, route message to pool  -  If NO, patient needs to be scheduled for appointment.  What is the name of the medication or equipment? Prilosec and Amiodarone  Have you contacted your pharmacy to request a refill? Yes  Which pharmacy would you like this sent to? [facility] [place]   Patient notified that their request is being sent to the clinical staff for review and that they should receive a response within 2 business days.

## 2023-02-11 NOTE — Progress Notes (Signed)
   Care Guide Note  02/11/2023 Name: EMRAH CORRIEA MRN: [id number] DOB: 1946-03-15  Referred by: [name], FNP Reason for referral : Care Coordination (Outreach to schedule with Pharm d )   Ronald [name] is a 77 y.o. year old male who is a primary care patient of [name], FNP. [name] was referred to the pharmacist for assistance related to HTN and DM.    Successful contact was made with the patient to discuss pharmacy services including being ready for the pharmacist to call at least 5 minutes before the scheduled appointment time, to have medication bottles and any blood sugar or blood pressure readings ready for review. The patient agreed to meet with the pharmacist via with the pharmacist via telephone visit on (date/time).  03/06/2023  Penne Lash, RMA Care Guide [facility]  [place], [place] [address] Direct Dial: [phone] [email or web address]

## 2023-02-12 MED ORDER — OMEPRAZOLE 20 MG PO CPDR: 20.0000 mg | DELAYED_RELEASE_CAPSULE | Freq: Every day | ORAL | 0 refills | Status: DC

## 2023-02-12 NOTE — Telephone Encounter (Signed)
Aware refill sent to pharmacy ?

## 2023-02-14 ENCOUNTER — Other Ambulatory Visit: Payer: Self-pay | Admitting: Family

## 2023-02-17 ENCOUNTER — Other Ambulatory Visit: Payer: Self-pay | Admitting: Family

## 2023-02-17 DIAGNOSIS — F331 Major depressive disorder, recurrent, moderate: Secondary | ICD-10-CM

## 2023-02-17 DIAGNOSIS — F419 Anxiety disorder, unspecified: Secondary | ICD-10-CM

## 2023-02-21 ENCOUNTER — Other Ambulatory Visit: Payer: Self-pay | Admitting: Family

## 2023-02-21 DIAGNOSIS — I509 Heart failure, unspecified: Secondary | ICD-10-CM

## 2023-02-23 ENCOUNTER — Other Ambulatory Visit: Payer: Medicare HMO | Admitting: *Deleted

## 2023-02-23 ENCOUNTER — Encounter: Payer: Self-pay | Admitting: *Deleted

## 2023-02-23 NOTE — Patient Outreach (Signed)
Care Management   Visit Note  02/23/2023 Name: Ronald [name] MRN: [id number] DOB: [birth year]  Subjective: Ronald [name] is a 77 y.o. year old male who is a primary care patient of [name], FNP. The Care Management team was consulted for assistance.      Engaged with patient spoke with patient by telephone for follow up   Goals Addressed             This Visit's Progress    CCM (COPD) EXPECTED OUTCOME: MONITOR, SELF-MANAGE AND REDUCE SYMPTOMS OF COPD       Current Barriers:  Knowledge Deficits related to COPD management Chronic Disease Management support and education needs related to COPD, medications Literacy barriers Non-adherence to prescribed medication regimen No Advanced Directives in place- pt declines Patient reports he uses oxygen at 3 liters at hs as needed Per daughter in law Ronald [name], pt is set in his ways and not been willing to change, family has tried and it does no good, not worth the hard feelings it has caused in the past Spoke with patient's ex- wife Ronald [name] (lives with pt) who reports pt has cut down to 1/3 ppd cigarettes and is trying " to do better" Per Ronald [name], pt has home health working with him since hospitalization several weeks ago for COPD exacerbation, has home health PT, will be having a nurse to teach pt how to use his glucometer, Ronald [name] states patient is ready to learn, pt has been walking and seems to have more motivation Ronald [name] reports pt went to pharmacy to pickup Eliquis and could not afford, states pt is in donut hole, pt is out of Eliquis and not taking, would like assistance from pharmacist Update 02/23/23- pt states he is still out of Eliquis and Jardiance (not sure how long he has been out of these medications), pharmD is to outreach pt on 03/06/23, pt verbalizes understanding  Planned Interventions: Advised patient to track and manage COPD triggers Advised patient to self assesses COPD action plan zone and make appointment with provider if in  the yellow zone for 48 hours without improvement Advised patient to engage in light exercise as tolerated 3-5 days a week to aid in the the management of COPD Provided education about and advised patient to utilize infection prevention strategies to reduce risk of respiratory infection Discussed the importance of adequate rest and management of fatigue with COPD Reviewed COPD action plan with emphasis on yellow zone Reinforced importance of breathing exercises, relaxation in management of COPD Reviewed importance of smoking cessation Reviewed- cannot smoke around oxygen Encouraged pt to call primary care provider office ahead of time when needing samples for any medications/ inhalers In basket sent to pharmacist Vanice Sarah reporting pt continues to be out of Eliquis and Jardiance  Symptom Management: Take medications as prescribed   Attend all scheduled provider appointments Call pharmacy for medication refills 3-7 days in advance of running out of medications Attend church or other social activities Perform all self care activities independently  Call provider office for new concerns or questions  eliminate smoking in my home identify and remove indoor air pollutants limit outdoor activity during cold weather listen for public air quality announcements every day do breathing exercises every day develop a rescue plan eliminate symptom triggers at home follow rescue plan if symptoms flare-up don't eat or exercise right before bedtime eat healthy/prescribed diet: heart healthy, low sodium get at least 7 to 8 hours of sleep at night practice relaxation or meditation  daily do breathing exercises at least 2 times each day do exercises in a comfortable position that makes breathing as easy as possible Do not smoke around oxygen Follow COPD action plan- call your doctor early on for change in health status, symptoms Avoid sick people, wear a mask as needed, practice good  handwashing Call your doctor's office for medication samples before you run out Pharmacist will be contacting you about Eliquis and Jardiance on 03/06/23 It is very important that you take all medications including Eliquis as prescribed Reviewed risks associated with not taking blood thinner  Follow Up Plan: Telephone follow up appointment with care management team member scheduled for:   04/14/23 at 3 pm       CCM (DIABETES) EXPECTED OUTCOME:  MONITOR, SELF-MANAGE AND REDUCE SYMPTOMS OF DIABETES       Current Barriers:  Knowledge Deficits related to Diabetes management Chronic Disease Management support and education needs related to Diabetes and diet Literacy barriers Non-adherence to prescribed medication regimen No Advanced Directives in place- pt declines information Spoke with patient and ex-wife Ronald [name] (per pt request), reports independent with ADL's, cannot read or write and ex-wife and daughter in Company secretary (nurse) provide some assistance, Ronald [name] oversees medications and Ronald [name] reports pt does not let Ronald [name] know when he is out of as needed medications such as albuterol inhaler or xanax.   Patient states he does not have diabetes, has glucometer but does not check CBG and states , Ronald [name] reports pt is not willing to do any of this and does not acknowledge that he has diabetes, Ronald [name] now reports pt is willing to learn to use glucometer and states home health RN will be assisting. Ronald [name] (daughter in law)  tries to provide oversight with medications but pt will not let her know when he is out of prn medications.   Pt is unable to use a telephone and cannot call in his own refills and Ronald [name] will not be responsible for this. 02/23/23- pt reports he has decided not to check CBG, pt feels with AIC 6.3 on 02/02/23 this is not necessary and pt is not interested  Planned Interventions: Reviewed medications with patient and discussed importance of medication adherence;        Counseled on importance of  regular laboratory monitoring as prescribed;        Advised patient, providing education and rationale, to check cbg per MD order  and record        call provider for findings outside established parameters;       Reinforced carbohydrate modified diet and food choices Reviewed all upcoming scheduled appointments  Symptom Management: Take medications as prescribed   Attend all scheduled provider appointments Call pharmacy for medication refills 3-7 days in advance of running out of medications Attend church or other social activities Perform all self care activities independently  Perform IADL's (shopping, preparing meals, housekeeping, managing finances) independently Call provider office for new concerns or questions  check blood sugar at prescribed times: when you have symptoms of low or high blood sugar and per doctor order  check feet daily for cuts, sores or redness enter blood sugar readings and medication or insulin into daily log take the blood sugar log to all doctor visits take the blood sugar meter to all doctor visits trim toenails straight across fill half of plate with vegetables limit fast food meals to no more than 1 per week manage portion size prepare main meal at home 3 to 5 days each  week read food labels for fat, fiber, carbohydrates and portion size set a realistic goal keep feet up while sitting wash and dry feet carefully every day wear comfortable, cotton socks wear comfortable, well-fitting shoes Call in advance to primary care provider for any medication/ inhalers samples you may need Let your family know when refills are needed so you don't run out of any medication  Follow Up Plan: Telephone follow up appointment with care management team member scheduled for:  04/24/23 at 3 pm           Plan: Telephone follow up appointment with care management team member scheduled for: 04/24/23 at 3 pm  Irving Shows [facility], BSN [place]/ Ambulatory Care  Management [phone]

## 2023-02-23 NOTE — Patient Instructions (Signed)
Visit Information  Thank you for taking time to visit with me today. Please don't hesitate to contact me if I can be of assistance to you before our next scheduled telephone appointment.  Following are the goals we discussed today:   Goals Addressed             This Visit's Progress    CCM (COPD) EXPECTED OUTCOME: MONITOR, SELF-MANAGE AND REDUCE SYMPTOMS OF COPD       Current Barriers:  Knowledge Deficits related to COPD management Chronic Disease Management support and education needs related to COPD, medications Literacy barriers Non-adherence to prescribed medication regimen No Advanced Directives in place- pt declines Patient reports he uses oxygen at 3 liters at hs as needed Per daughter in law [name], pt is set in his ways and not been willing to change, family has tried and it does no good, not worth the hard feelings it has caused in the past Spoke with patient's ex- wife [name] (lives with pt) who reports pt has cut down to 1/3 ppd cigarettes and is trying " to do better" Per [name], pt has home health working with him since hospitalization several weeks ago for COPD exacerbation, has home health PT, will be having a nurse to teach pt how to use his glucometer, [name] states patient is ready to learn, pt has been walking and seems to have more motivation [name] reports pt went to pharmacy to pickup Eliquis and could not afford, states pt is in donut hole, pt is out of Eliquis and not taking, would like assistance from pharmacist Update 02/23/23- pt states he is still out of Eliquis and Jardiance (not sure how long he has been out of these medications), pharmD is to outreach pt on 03/06/23, pt verbalizes understanding  Planned Interventions: Advised patient to track and manage COPD triggers Advised patient to self assesses COPD action plan zone and make appointment with provider if in the yellow zone for 48 hours without improvement Advised patient to engage in light exercise as  tolerated 3-5 days a week to aid in the the management of COPD Provided education about and advised patient to utilize infection prevention strategies to reduce risk of respiratory infection Discussed the importance of adequate rest and management of fatigue with COPD Reviewed COPD action plan with emphasis on yellow zone Reinforced importance of breathing exercises, relaxation in management of COPD Reviewed importance of smoking cessation Reviewed- cannot smoke around oxygen Encouraged pt to call primary care provider office ahead of time when needing samples for any medications/ inhalers In basket sent to pharmacist Vanice Sarah reporting pt continues to be out of Eliquis and Jardiance  Symptom Management: Take medications as prescribed   Attend all scheduled provider appointments Call pharmacy for medication refills 3-7 days in advance of running out of medications Attend church or other social activities Perform all self care activities independently  Call provider office for new concerns or questions  eliminate smoking in my home identify and remove indoor air pollutants limit outdoor activity during cold weather listen for public air quality announcements every day do breathing exercises every day develop a rescue plan eliminate symptom triggers at home follow rescue plan if symptoms flare-up don't eat or exercise right before bedtime eat healthy/prescribed diet: heart healthy, low sodium get at least 7 to 8 hours of sleep at night practice relaxation or meditation daily do breathing exercises at least 2 times each day do exercises in a comfortable position that makes breathing as easy as  possible Do not smoke around oxygen Follow COPD action plan- call your doctor early on for change in health status, symptoms Avoid sick people, wear a mask as needed, practice good handwashing Call your doctor's office for medication samples before you run out Pharmacist will be contacting  you about Eliquis and Jardiance on 03/06/23 It is very important that you take all medications including Eliquis as prescribed Reviewed risks associated with not taking blood thinner  Follow Up Plan: Telephone follow up appointment with care management team member scheduled for:   04/14/23 at 3 pm       CCM (DIABETES) EXPECTED OUTCOME:  MONITOR, SELF-MANAGE AND REDUCE SYMPTOMS OF DIABETES       Current Barriers:  Knowledge Deficits related to Diabetes management Chronic Disease Management support and education needs related to Diabetes and diet Literacy barriers Non-adherence to prescribed medication regimen No Advanced Directives in place- pt declines information Spoke with patient and ex-wife [name] (per pt request), reports independent with ADL's, cannot read or write and ex-wife and daughter in Company secretary (nurse) provide some assistance, [name] oversees medications and [name] reports pt does not let [name] know when he is out of as needed medications such as albuterol inhaler or xanax.   Patient states he does not have diabetes, has glucometer but does not check CBG and states , [name] reports pt is not willing to do any of this and does not acknowledge that he has diabetes, [name] now reports pt is willing to learn to use glucometer and states home health RN will be assisting. [name] (daughter in law)  tries to provide oversight with medications but pt will not let her know when he is out of prn medications.   Pt is unable to use a telephone and cannot call in his own refills and [name] will not be responsible for this. 02/23/23- pt reports he has decided not to check CBG, pt feels with AIC 6.3 on 02/02/23 this is not necessary and pt is not interested  Planned Interventions: Reviewed medications with patient and discussed importance of medication adherence;        Counseled on importance of regular laboratory monitoring as prescribed;        Advised patient, providing education and rationale, to check  cbg per MD order  and record        call provider for findings outside established parameters;       Reinforced carbohydrate modified diet and food choices Reviewed all upcoming scheduled appointments  Symptom Management: Take medications as prescribed   Attend all scheduled provider appointments Call pharmacy for medication refills 3-7 days in advance of running out of medications Attend church or other social activities Perform all self care activities independently  Perform IADL's (shopping, preparing meals, housekeeping, managing finances) independently Call provider office for new concerns or questions  check blood sugar at prescribed times: when you have symptoms of low or high blood sugar and per doctor order  check feet daily for cuts, sores or redness enter blood sugar readings and medication or insulin into daily log take the blood sugar log to all doctor visits take the blood sugar meter to all doctor visits trim toenails straight across fill half of plate with vegetables limit fast food meals to no more than 1 per week manage portion size prepare main meal at home 3 to 5 days each week read food labels for fat, fiber, carbohydrates and portion size set a realistic goal keep feet up while sitting wash and  dry feet carefully every day wear comfortable, cotton socks wear comfortable, well-fitting shoes Call in advance to primary care provider for any medication/ inhalers samples you may need Let your family know when refills are needed so you don't run out of any medication  Follow Up Plan: Telephone follow up appointment with care management team member scheduled for:  04/24/23 at 3 pm           Our next appointment is by telephone on 04/24/23 at 3 pm  Please call the care guide team at [phone] if you need to cancel or reschedule your appointment.   If you are experiencing a Mental Health or Behavioral Health Crisis or need someone to talk to, please call the  Suicide and Crisis Lifeline: 988 call the [place] National Suicide Prevention Lifeline: (838)358-9174 or TTY: 956-200-2911 TTY 313 564 7587) to talk to a trained counselor call 1-800-273-TALK (toll free, 24 hour hotline) go to [facility] [address] [place] [phone]) call the [facility] Crisis Line: [phone] call 911   Patient verbalizes understanding of instructions and care plan provided today and agrees to view in MyChart. Active MyChart status and patient understanding of how to access instructions and care plan via MyChart confirmed with patient.     Telephone follow up appointment with care management team member scheduled for: 04/24/23 at 3 pm  Irving Shows [facility], BSN [place]/ Ambulatory Care Management [phone]

## 2023-02-24 ENCOUNTER — Other Ambulatory Visit: Payer: Self-pay | Admitting: Family

## 2023-02-24 DIAGNOSIS — F331 Major depressive disorder, recurrent, moderate: Secondary | ICD-10-CM

## 2023-02-24 DIAGNOSIS — F419 Anxiety disorder, unspecified: Secondary | ICD-10-CM

## 2023-03-05 DIAGNOSIS — I5030 Unspecified diastolic (congestive) heart failure: Secondary | ICD-10-CM | POA: Diagnosis not present

## 2023-03-06 ENCOUNTER — Telehealth: Payer: Self-pay | Admitting: Pharmacist

## 2023-03-06 ENCOUNTER — Other Ambulatory Visit: Payer: Medicare HMO

## 2023-03-06 NOTE — Telephone Encounter (Signed)
Called patient to r/s appt due to practice closing (no answer) I'm happy to work him in next Wednesday If he needs certain med assistance applications mailed, let me know and I can do sooner

## 2023-03-13 ENCOUNTER — Other Ambulatory Visit: Payer: Medicare HMO

## 2023-03-13 DIAGNOSIS — E119 Type 2 diabetes mellitus without complications: Secondary | ICD-10-CM

## 2023-03-13 DIAGNOSIS — J439 Emphysema, unspecified: Secondary | ICD-10-CM

## 2023-03-13 DIAGNOSIS — Z7984 Long term (current) use of oral hypoglycemic drugs: Secondary | ICD-10-CM

## 2023-03-13 MED ORDER — IPRATROPIUM-ALBUTEROL 0.5-2.5 (3) MG/3ML IN SOLN: 3.0000 mL | Freq: Four times a day (QID) | RESPIRATORY_TRACT | 3 refills | Status: DC | PRN

## 2023-03-30 ENCOUNTER — Other Ambulatory Visit: Payer: Self-pay | Admitting: Family

## 2023-03-31 ENCOUNTER — Other Ambulatory Visit: Payer: Self-pay | Admitting: Family

## 2023-03-31 DIAGNOSIS — I509 Heart failure, unspecified: Secondary | ICD-10-CM

## 2023-04-03 ENCOUNTER — Other Ambulatory Visit: Payer: Self-pay | Admitting: Family

## 2023-04-04 DIAGNOSIS — I5030 Unspecified diastolic (congestive) heart failure: Secondary | ICD-10-CM | POA: Diagnosis not present

## 2023-04-14 ENCOUNTER — Telehealth: Payer: Self-pay | Admitting: Pharmacist

## 2023-04-14 MED ORDER — DAPAGLIFLOZIN PROPANEDIOL 10 MG PO TABS
10.0000 mg | ORAL_TABLET | Freq: Every day | ORAL | 5 refills | Status: DC
Start: 2023-04-14 — End: 2023-11-09

## 2023-04-14 MED ORDER — BREZTRI AEROSPHERE 160-9-4.8 MCG/ACT IN AERO
2.0000 | INHALATION_SPRAY | Freq: Two times a day (BID) | RESPIRATORY_TRACT | 11 refills | Status: DC
Start: 2023-04-14 — End: 2023-11-09

## 2023-04-14 NOTE — Telephone Encounter (Signed)
Can we make sure patient is [place] farxiga & breztri You can send me PAP Thanks!

## 2023-04-19 ENCOUNTER — Other Ambulatory Visit: Payer: Self-pay | Admitting: Family

## 2023-04-22 ENCOUNTER — Other Ambulatory Visit: Payer: Self-pay | Admitting: Family

## 2023-04-23 ENCOUNTER — Other Ambulatory Visit: Payer: Self-pay | Admitting: Family

## 2023-04-24 ENCOUNTER — Other Ambulatory Visit: Payer: Medicare HMO | Admitting: *Deleted

## 2023-04-28 NOTE — Telephone Encounter (Signed)
 Application emailed.

## 2023-04-29 ENCOUNTER — Encounter: Payer: Self-pay | Admitting: Cardiology

## 2023-04-29 ENCOUNTER — Ambulatory Visit: Payer: Medicare HMO | Attending: Cardiology | Admitting: Cardiology

## 2023-04-29 VITALS — BP 126/70 | HR 54 | Ht 69.0 in | Wt 169.6 lb

## 2023-04-29 DIAGNOSIS — I2510 Atherosclerotic heart disease of native coronary artery without angina pectoris: Secondary | ICD-10-CM

## 2023-04-29 DIAGNOSIS — R0789 Other chest pain: Secondary | ICD-10-CM

## 2023-04-29 DIAGNOSIS — E782 Mixed hyperlipidemia: Secondary | ICD-10-CM

## 2023-04-29 DIAGNOSIS — I480 Paroxysmal atrial fibrillation: Secondary | ICD-10-CM | POA: Diagnosis not present

## 2023-04-29 MED ORDER — ISOSORBIDE MONONITRATE ER 60 MG PO TB24: 60.0000 mg | ORAL_TABLET | Freq: Every day | ORAL | 3 refills | Status: DC

## 2023-04-29 MED ORDER — NITROGLYCERIN 0.4 MG SL SUBL: 0.4000 mg | SUBLINGUAL_TABLET | SUBLINGUAL | 3 refills | Status: DC | PRN

## 2023-04-29 NOTE — Patient Instructions (Signed)
Medication Instructions:  Your physician has recommended you make the following change in your medication:  Increase Imdur to 60 mg daily Continue taking all other medications as prescribed  Labwork: None  Testing/Procedures: Your physician has requested that you have a lexiscan myoview. For further information please visit [email or web address] Please follow instruction sheet, as given.   Follow-Up: Your physician recommends that you schedule a follow-up appointment in: 6 weeks with Lanora Manis  Any Other Special Instructions Will Be Listed Below (If Applicable). Thank you for choosing [facility]!      If you need a refill on your cardiac medications before your next appointment, please call your pharmacy.

## 2023-04-29 NOTE — Progress Notes (Signed)
`     Clinical Summary Mr. Altice is a 77 y.o.male  1.CAD - NSTEMI 05/2014 s/p DESx2 to LAD at [facility]   05/2020 cath: LM normal, LAD patent stents with 50% distal disease, LCX mild LIs, RCA prox 60% and mid 50%, RV Robin Petrakis 80% 08/2021 echo: LVEF 55-60%, grade I dd, multiple WMAs  - some recent chest pains. Symptoms about 3 times week. Can occur at rest or with activity. Sharp pain left sided, 5/10 in severity. Some SOB but no other symptoms. Pain lasts about 3-4 minutes, takes and NG helps sometimes. Can be worst with deep breathing.  - compliant with meds  - cannot run on treadmill due chronic back pain  2. HTN - compliant with meds   3. HLD 01/2023 TC 106 TG 131 HDL 44 LDL 39   4. PAF  -on amiodarone.  - on eliquis - no recent palpitations.     5. COPD  6. CKD 3A Past Medical History:  Diagnosis Date   Anxiety    Asthma    Atrial flutter (HCC) 04/2021   Chronic diastolic CHF (congestive heart failure) (HCC)    Chronic lower back pain    COPD (chronic obstructive pulmonary disease) (HCC)    Coronary artery disease    a. NSTEMI 05/2014 s/p DESx2 to LAD at [facility].   Depression    GERD (gastroesophageal reflux disease)    High cholesterol    History of tracheostomy    Hypertension    NSTEMI (non-ST elevated myocardial infarction) (HCC) 05/2014   with stent placement   PAF (paroxysmal atrial fibrillation) (HCC)    Sleep apnea    Stroke (HCC) 2017   anyeusym    TIA (transient ischemic attack)    "they say I've had some mini strokes; don't know when"; denies residual on 06/22/2014)   Tobacco abuse    Type II diabetes mellitus (HCC)    Ulcerative colitis (HCC)      Allergies  Allergen Reactions   Gabapentin Anxiety    Unknown reaction   Metformin And Related Rash     Current Outpatient Medications  Medication Sig Dispense Refill   acetaminophen (TYLENOL) 325 MG tablet Take 2 tablets (650 mg total) by mouth every 6 (six) hours as needed for mild pain  (or Fever >/= 101). 12 tablet 0   albuterol (VENTOLIN HFA) 108 (90 Base) MCG/ACT inhaler INHALE 2 PUFFS BY MOUTH EVERY 6 HOURS AS NEEDED FOR WHEEZING FOR SHORTNESS OF BREATH 9 g 2   ALPRAZolam (XANAX) 0.25 MG tablet Take 1 tablet (0.25 mg total) by mouth 2 (two) times daily as needed for anxiety. 60 tablet 2   amiodarone (PACERONE) 200 MG tablet Take 1 tablet by mouth once daily 90 tablet 0   apixaban (ELIQUIS) 5 MG TABS tablet Take 1 tablet (5 mg total) by mouth 2 (two) times daily. (Patient not taking: Reported on 02/23/2023) 28 tablet 0   bisoprolol (ZEBETA) 5 MG tablet TAKE 1 TABLET BY MOUTH ONCE DAILY IN  PLACE  OF  METOPROLOL 90 tablet 0   Budeson-Glycopyrrol-Formoterol (BREZTRI AEROSPHERE) 160-9-4.8 MCG/ACT AERO Inhale 2 puffs into the lungs 2 (two) times daily. 32.1 g 11   budesonide (PULMICORT) 0.5 MG/2ML nebulizer solution Take 2 mLs (0.5 mg total) by nebulization 2 (two) times daily. 100 mL 12   buPROPion (WELLBUTRIN XL) 150 MG 24 hr tablet Take 1 tablet by mouth once daily 90 tablet 0   busPIRone (BUSPAR) 7.5 MG tablet Take 1 tablet (7.5 mg  total) by mouth 3 (three) times daily as needed. 90 tablet 1   dapagliflozin propanediol (FARXIGA) 10 MG TABS tablet Take 1 tablet (10 mg total) by mouth daily before breakfast. 90 tablet 5   furosemide (LASIX) 40 MG tablet Take 1 tablet by mouth twice daily 180 tablet 0   glucose blood (ONE TOUCH ULTRA TEST) test strip USE TO CHECK GLUCOSE ONCE DAILY 100 each 3   ipratropium-albuterol (DUONEB) 0.5-2.5 (3) MG/3ML SOLN Take 3 mLs by nebulization every 6 (six) hours as needed (asthma). 360 mL 3   isosorbide mononitrate (IMDUR) 30 MG 24 hr tablet Take 1 tablet (30 mg total) by mouth daily. 90 tablet 3   nitroGLYCERIN (NITROSTAT) 0.4 MG SL tablet Place 1 tablet (0.4 mg total) under the tongue every 5 (five) minutes as needed for chest pain. 25 tablet 3   omeprazole (PRILOSEC) 20 MG capsule Take 1 capsule (20 mg total) by mouth daily. 90 capsule 0   OXYGEN  Inhale 3 L into the lungs at bedtime as needed (shortness of breath). (Patient not taking: Reported on 02/23/2023)     polyethylene glycol (MIRALAX / GLYCOLAX) 17 g packet Take 17 g by mouth daily. 30 each 2   potassium chloride SA (KLOR-CON M) 20 MEQ tablet Take 1 tablet by mouth twice daily 180 tablet 0   rosuvastatin (CRESTOR) 20 MG tablet Take 1 tablet by mouth once daily 90 tablet 0   No current facility-administered medications for this visit.     Past Surgical History:  Procedure Laterality Date   APPENDECTOMY     BIOPSY  07/20/2020   Procedure: BIOPSY;  Surgeon: Dolores Frame, MD;  [facility];  Service: Gastroenterology;;   CARDIAC CATHETERIZATION  [phone] X 3   CHOLECYSTECTOMY     COLONOSCOPY WITH PROPOFOL N/A 07/20/2020   Procedure: COLONOSCOPY WITH PROPOFOL;  Surgeon: Dolores Frame, MD;  [facility]: [facility];  Service: Gastroenterology;  Laterality: N/A;  1:15   CORONARY ANGIOPLASTY WITH STENT PLACEMENT  05/2014   "2"   ESOPHAGEAL DILATION N/A 07/20/2020   Procedure: ESOPHAGEAL DILATION;  Surgeon: Dolores Frame, MD;  [facility];  Service: Gastroenterology;  Laterality: N/A;   ESOPHAGOGASTRODUODENOSCOPY (EGD) WITH PROPOFOL N/A 07/20/2020   Procedure: ESOPHAGOGASTRODUODENOSCOPY (EGD) WITH PROPOFOL;  Surgeon: Dolores Frame, MD;  [facility];  Service: Gastroenterology;  Laterality: N/A;   IR GASTROSTOMY TUBE MOD SED  06/04/2021   IR GASTROSTOMY TUBE REMOVAL  07/24/2021   LEFT HEART CATH AND CORONARY ANGIOGRAPHY N/A 05/25/2020   Procedure: LEFT HEART CATH AND CORONARY ANGIOGRAPHY;  Surgeon: [place], Peter M, MD;  Location: [facility] INVASIVE CV LAB;  Service: Cardiovascular;  Laterality: N/A;   POLYPECTOMY  07/20/2020   Procedure: POLYPECTOMY INTESTINAL;  Surgeon: Dolores Frame, MD;  [facility]: [facility];  Service: Gastroenterology;;   TUMOR EXCISION Right ~ 1999   "side of my upper head"      Allergies  Allergen Reactions   Gabapentin Anxiety    Unknown reaction   Metformin And Related Rash      Family History  Problem Relation Age of Onset   CAD Father    Lung cancer Brother        smoked   Cancer Brother        lung   Leukemia Sister    Dementia Sister    Stroke Mother    Emphysema Sister    Cancer Brother  lung     Social History Mr. Uhls reports that he has been smoking cigarettes. He started smoking about 57 years ago. He has a 27.7 pack-year smoking history. He has never been exposed to tobacco smoke. He has never used smokeless tobacco. Mr. Lobianco reports that he does not currently use alcohol.   Review of Systems CONSTITUTIONAL: No weight loss, fever, chills, weakness or fatigue.  HEENT: Eyes: No visual loss, blurred vision, double vision or yellow sclerae.No hearing loss, sneezing, congestion, runny nose or sore throat.  SKIN: No rash or itching.  CARDIOVASCULAR: per hpi RESPIRATORY: per hpi GASTROINTESTINAL: No anorexia, nausea, vomiting or diarrhea. No abdominal pain or blood.  GENITOURINARY: No burning on urination, no polyuria NEUROLOGICAL: No headache, dizziness, syncope, paralysis, ataxia, numbness or tingling in the extremities. No change in bowel or bladder control.  MUSCULOSKELETAL: No muscle, back pain, joint pain or stiffness.  LYMPHATICS: No enlarged nodes. No history of splenectomy.  PSYCHIATRIC: No history of depression or anxiety.  ENDOCRINOLOGIC: No reports of sweating, cold or heat intolerance. No polyuria or polydipsia.  Marland Kitchen   Physical Examination There were no vitals filed for this visit. There were no vitals filed for this visit.  Gen: resting comfortably, no acute distress HEENT: no scleral icterus, pupils equal round and reactive, no palptable cervical adenopathy,  CV Resp: Clear to auscultation bilaterally GI: abdomen is soft, non-tender, non-distended, normal bowel sounds, no hepatosplenomegaly MSK:  extremities are warm, no edema.  Skin: warm, no rash Neuro:  no focal deficits Psych: appropriate affect     Assessment and Plan  1.CAD - recent chest pains, plan for lexiscan to further evaluate - increase imdur to 60mg  daily - cannot run on treadmill due to chronic back pain  2. PAF - EKG today shows SR - no symptoms, continue current meds  3. HLD - at goal, continue current meds         Antoine Poche, M.D.

## 2023-05-05 ENCOUNTER — Telehealth: Payer: Self-pay | Admitting: Family Medicine

## 2023-05-05 ENCOUNTER — Other Ambulatory Visit: Payer: Self-pay | Admitting: Family

## 2023-05-05 ENCOUNTER — Ambulatory Visit: Payer: Medicare HMO | Admitting: Family

## 2023-05-05 DIAGNOSIS — F419 Anxiety disorder, unspecified: Secondary | ICD-10-CM

## 2023-05-05 DIAGNOSIS — I5030 Unspecified diastolic (congestive) heart failure: Secondary | ICD-10-CM | POA: Diagnosis not present

## 2023-05-05 DIAGNOSIS — F1320 Sedative, hypnotic or anxiolytic dependence, uncomplicated: Secondary | ICD-10-CM

## 2023-05-05 MED ORDER — ALPRAZOLAM 0.25 MG PO TABS: 0.2500 mg | ORAL_TABLET | Freq: Two times a day (BID) | ORAL | 0 refills | Status: DC | PRN

## 2023-05-05 NOTE — Telephone Encounter (Signed)
Copied from CRM [id number]. Topic: Clinical - Medication Refill >> May 05, 2023 10:55 AM Dennison Nancy wrote: Most Recent Primary Care Visit:  Provider: [facility]PHARMAC[facility]  Department: [facility]  Visit Type: PATIENT OUTREACH 60  Date: 03/13/2023  Medication: ALPRAZolam Prudy Feeler) 0.25 MG tablet  Has the patient contacted their pharmacy? yes (Agent: If no, request that the patient contact the pharmacy for the refill. If patient does not wish to contact the pharmacy document the reason why and proceed with request.) (Agent: If yes, when and what did the pharmacy advise?)  Is this the correct pharmacy for this prescription? yes If no, delete pharmacy and type the correct one.  This is the patient's preferred pharmacy:  [facility] [address] [place] - [address] [place] [address] Phone: [phone] Fax: [phone]  [facility] - [place], [place] - [address] [place] [address] Phone: [phone] Fax: [phone]   Has the prescription been filled recently? Yes   Is the patient out of the medication? No will be 2 days short   Has the patient been seen for an appointment in the last year OR does the patient have an upcoming appointment? Yes   Can we respond through MyChart? no  Agent: Please be advised that Rx refills may take up to 3 business days. We ask that you follow-up with your pharmacy.

## 2023-05-06 ENCOUNTER — Telehealth: Payer: Self-pay | Admitting: Family Medicine

## 2023-05-06 NOTE — Telephone Encounter (Signed)
Copied from CRM [id number]. Topic: Clinical - Medication Question >> May 06, 2023 11:54 AM Ronald [name] wrote: Reason for CRM: [name], Daughter in Social worker, has question about med [place]. She takes care of all of his meds and he does not have this med. I found a note in out system (Can we make sure patient is re-enrolled in az&me PAP for farxiga & breztri You can send me PAP) from Orma Render, Pharmacist.  Please call [name] to let her know if he needs this med. If he does, please send to pharmacy - Thanks

## 2023-05-08 ENCOUNTER — Telehealth: Payer: Self-pay

## 2023-05-08 NOTE — Telephone Encounter (Signed)
AZ&ME renewal app completed and faxed to company.   New telephone encounter created.

## 2023-05-08 NOTE — Progress Notes (Deleted)
Pharmacy Medication Assistance Program Note    05/08/2023  Patient ID: Ronald [name], male   DOB: 12-Oct-1945, 78 y.o.   MRN: [id number]     05/08/2023  Outreach Medication One  Manufacturer Medication One Nurse, adult Drugs Farxiga  Dose of Farxiga 10MG   Type of Radiographer, therapeutic Assistance  Date Application Submitted to Manufacturer 05/08/2023  Method Application Sent to Manufacturer Fax           05/08/2023  Outreach Medication Two  Child psychotherapist Medication Two Nurse, adult Drugs Bretztri  Dose of Breztri 160  Type of Journalist, newspaper to [employer]  Date Application Submitted to [employer] 05/08/2023

## 2023-05-11 ENCOUNTER — Ambulatory Visit: Payer: Medicare HMO

## 2023-05-12 ENCOUNTER — Encounter (HOSPITAL_COMMUNITY): Payer: Medicare HMO

## 2023-05-12 ENCOUNTER — Other Ambulatory Visit: Payer: Self-pay | Admitting: Family

## 2023-05-12 ENCOUNTER — Telehealth: Payer: Self-pay | Admitting: Family

## 2023-05-12 NOTE — Telephone Encounter (Signed)
Called and spoke to [name] she is aware he is to take 1 40mg  of lasix daily unless sob or wt gain per last labs. Also aware we will reevaluate at next visit and labs.

## 2023-05-12 NOTE — Telephone Encounter (Signed)
[name] called to confirm medications pt is supposed to be taking. Says he has only been taking 1 40mg  tablet of lasix but saw that he is supposed to be taking 2 40mg  tablets daily. Confirmed that he is supposed to be taking 2 40mg  tablets daily per most recent med refill. [name] wants to confirm this with nurse.

## 2023-05-15 NOTE — Progress Notes (Signed)
Pharmacy Medication Assistance Program Note    05/15/2023  Patient ID: Ronald [name], male   DOB: [birth year], 77 y.o.   MRN: [id number]     05/08/2023  Outreach Medication One  Manufacturer Medication One Nurse, adult Drugs Farxiga  Dose of Farxiga 10MG   Type of Radiographer, therapeutic Assistance  Date Application Submitted to Manufacturer 05/08/2023  Method Application Sent to Manufacturer Fax  Patient Assistance Determination Approved  Approval Start Date 05/11/2023  Approval End Date 06/08/2024           05/08/2023  Outreach Medication Two  Manufacturer Medication Two [employer] Drugs Bretztri  Dose of Breztri 160  Type of Journalist, newspaper to [employer] Fax  Date Application Submitted to Manufacturer 05/08/2023  Patient Assistance Determination Approved  Approval Start Date 05/11/2023

## 2023-05-19 ENCOUNTER — Other Ambulatory Visit: Payer: Self-pay | Admitting: Family

## 2023-05-19 ENCOUNTER — Other Ambulatory Visit: Payer: Self-pay

## 2023-05-19 MED ORDER — OMEPRAZOLE 20 MG PO CPDR: 20.0000 mg | DELAYED_RELEASE_CAPSULE | Freq: Every day | ORAL | 0 refills | Status: DC

## 2023-05-19 NOTE — Addendum Note (Signed)
Addended by: Ignacia Bayley on: 05/19/2023 03:46 PM   Modules accepted: Orders

## 2023-05-19 NOTE — Telephone Encounter (Signed)
Copied from CRM [id number]. Topic: Clinical - Medication Question >> May 19, 2023  2:41 PM Alcus Dad H wrote: Reason for CRM: Refill was first requested on December 3rd for omeprazole (PRILOSEC) 20 MG, [name] was calling today because refill still hasn't been sent to pharmacy and pt needs medication

## 2023-05-19 NOTE — Telephone Encounter (Signed)
This was sent on 11/27 with 90 refills naomie statss he has it

## 2023-05-22 ENCOUNTER — Encounter: Payer: Self-pay | Admitting: Family

## 2023-05-22 ENCOUNTER — Telehealth: Payer: Self-pay | Admitting: Family Medicine

## 2023-05-22 ENCOUNTER — Telehealth (INDEPENDENT_AMBULATORY_CARE_PROVIDER_SITE_OTHER): Payer: Medicare HMO | Admitting: Family

## 2023-05-22 DIAGNOSIS — J441 Chronic obstructive pulmonary disease with (acute) exacerbation: Secondary | ICD-10-CM

## 2023-05-22 MED ORDER — ALBUTEROL SULFATE HFA 108 (90 BASE) MCG/ACT IN AERS: 2.0000 | INHALATION_SPRAY | Freq: Four times a day (QID) | RESPIRATORY_TRACT | 0 refills | Status: DC | PRN

## 2023-05-22 MED ORDER — BENZONATATE 200 MG PO CAPS: 200.0000 mg | ORAL_CAPSULE | Freq: Three times a day (TID) | ORAL | 1 refills | Status: DC | PRN

## 2023-05-22 MED ORDER — DOXYCYCLINE HYCLATE 100 MG PO TABS: 100.0000 mg | ORAL_TABLET | Freq: Two times a day (BID) | ORAL | 0 refills | Status: DC

## 2023-05-22 NOTE — Telephone Encounter (Signed)
Copied from CRM [id number]. Topic: General - Other >> May 22, 2023  3:54 PM Ronald [name] wrote: Reason for CRM: pt says that he do not know how to do mychart for video visit  he wants to call @[phone] 

## 2023-05-22 NOTE — Telephone Encounter (Signed)
Copied from CRM [id number]. Topic: Clinical - Medical Advice >> May 22, 2023  1:55 PM Ronald [name] wrote: Reason for CRM: Patient is reporting congestion and a runny nose that is running like water - they would love a call from [place] or Neysa Bonito to see if they could have something called into their local [facility] pharmacy. They have an appointment upcoming towards the end of the month. They called earlier but they haven't heard back yet.

## 2023-05-22 NOTE — Telephone Encounter (Signed)
Appt made with [facility] today patient aware

## 2023-05-22 NOTE — Progress Notes (Signed)
Virtual Visit Consent   Ronald [name], you are scheduled for a virtual visit with a [facility] provider today. Just as with appointments in the office, your consent must be obtained to participate. Your consent will be active for this visit and any virtual visit you may have with one of our providers in the next 365 days. If you have a MyChart account, a copy of this consent can be sent to you electronically.  As this is a virtual visit, video technology does not allow for your provider to perform a traditional examination. This may limit your provider's ability to fully assess your condition. If your provider identifies any concerns that need to be evaluated in person or the need to arrange testing (such as labs, EKG, etc.), we will make arrangements to do so. Although advances in technology are sophisticated, we cannot ensure that it will always work on either your end or our end. If the connection with a video visit is poor, the visit may have to be switched to a telephone visit. With either a video or telephone visit, we are not always able to ensure that we have a secure connection.  By engaging in this virtual visit, you consent to the provision of healthcare and authorize for your insurance to be billed (if applicable) for the services provided during this visit. Depending on your insurance coverage, you may receive a charge related to this service.  I need to obtain your verbal consent now. Are you willing to proceed with your visit today? Ronald [name] has provided verbal consent on 05/22/2023 for a virtual visit (video or telephone). [name], FNP  Date: 05/22/2023 4:21 PM  Virtual Visit via Video Note   I, [name], connected with  Ronald [name]  ([id number], [birth year]) on 05/22/23 at 12:25 PM EST by a video-enabled telemedicine application and verified that I am speaking with the correct person using two identifiers.  Location: Patient: Virtual Visit Location Patient:  Home Provider: Virtual Visit Location Provider: Home Office   I discussed the limitations of evaluation and management by telemedicine and the availability of in person appointments. The patient expressed understanding and agreed to proceed.    History of Present Illness: Ronald [name] is a 77 y.o. who identifies as a male who was assigned male at birth, and is being seen today for cough.  HPI: Cough This is a new problem. The current episode started 1 to 4 weeks ago. The problem has been gradually worsening. The problem occurs every few minutes. The cough is Productive of purulent sputum. Associated symptoms include nasal congestion, postnasal drip, rhinorrhea and shortness of breath. Pertinent negatives include no ear congestion, ear pain, fever or headaches. Risk factors for lung disease include smoking/tobacco exposure. He has tried rest and OTC cough suppressant for the symptoms. His past medical history is significant for emphysema.    Problems:  Patient Active Problem List   Diagnosis Date Noted   GOLD 3 COPD 07/07/2022   Prolonged QT interval 11/23/2021   Acute exacerbation of CHF (congestive heart failure) (HCC) 11/23/2021   Acute on chronic diastolic CHF (congestive heart failure) (HCC) 11/22/2021   Non-ST elevation (NSTEMI) myocardial infarction (HCC) 09/01/2021   Acute respiratory failure (HCC) 07/10/2021   Pulmonary nodule 07/10/2021   Malnutrition of moderate degree 05/13/2021   Atrial flutter (HCC) 05/01/2021   Shock circulatory (HCC) 05/01/2021   Elevated troponin 12/21/2020   Hypokalemia 12/21/2020   Mixed hyperlipidemia 12/21/2020   Acute on chronic  respiratory failure with hypoxia (HCC) 12/21/2020   Chest pain 05/23/2020   Controlled substance agreement signed 04/03/2020   Benzodiazepine dependence (HCC) 04/03/2020   Dysphagia 02/27/2020   Tracheal stenosis 12/09/2019   Lymphedema 04/01/2019   CHF (congestive heart failure) (HCC) 08/27/2018   Aortic  atherosclerosis (HCC) 05/11/2017   Depression 05/05/2017   GERD (gastroesophageal reflux disease) 01/16/2017   Diabetic neuropathy (HCC) 01/16/2017   Anterolisthesis 12/04/2016   History of MI (myocardial infarction) 12/04/2016   OSA (obstructive sleep apnea) 12/04/2016   Closed fracture dislocation of lumbar spine (HCC) 07/25/2016   Closed fracture of body of sternum 07/25/2016   Multiple fractures of cervical spine (HCC) 07/25/2016   Anxiety 02/14/2016   Chronic respiratory failure with hypoxia (HCC) 07/19/2014   Essential hypertension 07/19/2014   Cigarette smoker 07/19/2014   CAD in native artery 06/22/2014   Diabetes mellitus (HCC) 06/22/2014   Tobacco abuse 06/22/2014   Acute encephalopathy 06/22/2014   COPD (chronic obstructive pulmonary disease) with emphysema (HCC) 06/22/2014    Allergies:  Allergies  Allergen Reactions   Gabapentin Anxiety    Unknown reaction   Metformin And Related Rash   Medications:  Current Outpatient Medications:    albuterol (VENTOLIN HFA) 108 (90 Base) MCG/ACT inhaler, Inhale 2 puffs into the lungs every 6 (six) hours as needed for wheezing or shortness of breath., Disp: 8 g, Rfl: 0   benzonatate (TESSALON) 200 MG capsule, Take 1 capsule (200 mg total) by mouth 3 (three) times daily as needed., Disp: 30 capsule, Rfl: 1   doxycycline (VIBRA-TABS) 100 MG tablet, Take 1 tablet (100 mg total) by mouth 2 (two) times daily., Disp: 20 tablet, Rfl: 0   acetaminophen (TYLENOL) 325 MG tablet, Take 2 tablets (650 mg total) by mouth every 6 (six) hours as needed for mild pain (or Fever >/= 101)., Disp: 12 tablet, Rfl: 0   ALPRAZolam (XANAX) 0.25 MG tablet, Take 1 tablet (0.25 mg total) by mouth 2 (two) times daily as needed for anxiety., Disp: 60 tablet, Rfl: 0   amiodarone (PACERONE) 200 MG tablet, Take 1 tablet by mouth once daily, Disp: 90 tablet, Rfl: 0   apixaban (ELIQUIS) 5 MG TABS tablet, Take 1 tablet (5 mg total) by mouth 2 (two) times daily. (Patient  not taking: Reported on 02/23/2023), Disp: 28 tablet, Rfl: 0   bisoprolol (ZEBETA) 5 MG tablet, TAKE 1 TABLET BY MOUTH ONCE DAILY IN  PLACE  OF  METOPROLOL, Disp: 90 tablet, Rfl: 0   Budeson-Glycopyrrol-Formoterol (BREZTRI AEROSPHERE) 160-9-4.8 MCG/ACT AERO, Inhale 2 puffs into the lungs 2 (two) times daily., Disp: 32.1 g, Rfl: 11   budesonide (PULMICORT) 0.5 MG/2ML nebulizer solution, Take 2 mLs (0.5 mg total) by nebulization 2 (two) times daily., Disp: 100 mL, Rfl: 12   buPROPion (WELLBUTRIN XL) 150 MG 24 hr tablet, Take 1 tablet by mouth once daily, Disp: 90 tablet, Rfl: 0   busPIRone (BUSPAR) 7.5 MG tablet, Take 1 tablet (7.5 mg total) by mouth 3 (three) times daily as needed., Disp: 90 tablet, Rfl: 1   dapagliflozin propanediol (FARXIGA) 10 MG TABS tablet, Take 1 tablet (10 mg total) by mouth daily before breakfast., Disp: 90 tablet, Rfl: 5   furosemide (LASIX) 40 MG tablet, Take 1 tablet by mouth twice daily, Disp: 180 tablet, Rfl: 0   glucose blood (ONE TOUCH ULTRA TEST) test strip, USE TO CHECK GLUCOSE ONCE DAILY, Disp: 100 each, Rfl: 3   ipratropium-albuterol (DUONEB) 0.5-2.5 (3) MG/3ML SOLN, Take 3 mLs by nebulization  every 6 (six) hours as needed (asthma)., Disp: 360 mL, Rfl: 3   isosorbide mononitrate (IMDUR) 60 MG 24 hr tablet, Take 1 tablet (60 mg total) by mouth daily., Disp: 30 tablet, Rfl: 3   nitroGLYCERIN (NITROSTAT) 0.4 MG SL tablet, Place 1 tablet (0.4 mg total) under the tongue every 5 (five) minutes x 3 doses as needed for chest pain (if no relief after 2nd dose, proceed to ED or call 911)., Disp: 25 tablet, Rfl: 3   omeprazole (PRILOSEC) 20 MG capsule, Take 1 capsule (20 mg total) by mouth daily., Disp: 90 capsule, Rfl: 0   OXYGEN, Inhale 3 L into the lungs at bedtime as needed (shortness of breath)., Disp: , Rfl:    polyethylene glycol (MIRALAX / GLYCOLAX) 17 g packet, Take 17 g by mouth daily., Disp: 30 each, Rfl: 2   potassium chloride SA (KLOR-CON M) 20 MEQ tablet, Take 1  tablet by mouth twice daily, Disp: 180 tablet, Rfl: 0   rosuvastatin (CRESTOR) 20 MG tablet, Take 1 tablet by mouth once daily, Disp: 90 tablet, Rfl: 0  Observations/Objective: Patient is well-developed, well-nourished in no acute distress.  Resting comfortably  at home.  Head is normocephalic, atraumatic.  No labored breathing.  Speech is clear and coherent with logical content.  Patient is alert and oriented at baseline.  Coarse nonproductive cough  Assessment and Plan: 1. COPD exacerbation (HCC) (Primary) - doxycycline (VIBRA-TABS) 100 MG tablet; Take 1 tablet (100 mg total) by mouth 2 (two) times daily.  Dispense: 20 tablet; Refill: 0 - benzonatate (TESSALON) 200 MG capsule; Take 1 capsule (200 mg total) by mouth 3 (three) times daily as needed.  Dispense: 30 capsule; Refill: 1 - albuterol (VENTOLIN HFA) 108 (90 Base) MCG/ACT inhaler; Inhale 2 puffs into the lungs every 6 (six) hours as needed for wheezing or shortness of breath.  Dispense: 8 g; Refill: 0  - Take meds as prescribed - Use a cool mist humidifier  -Use saline nose sprays frequently -Force fluids -For any cough or congestion  Use plain Mucinex- regular strength or max strength is fine -For fever or aces or pains- take tylenol or ibuprofen. -Throat lozenges if help -Follow up if symptoms worsen or do not improve   Follow Up Instructions: I discussed the assessment and treatment plan with the patient. The patient was provided an opportunity to ask questions and all were answered. The patient agreed with the plan and demonstrated an understanding of the instructions.  A copy of instructions were sent to the patient via MyChart unless otherwise noted below.     The patient was advised to call back or seek an in-person evaluation if the symptoms worsen or if the condition fails to improve as anticipated.    [name], FNP

## 2023-05-22 NOTE — Telephone Encounter (Signed)
PT has virtual visit scheduled with me today.

## 2023-05-22 NOTE — Telephone Encounter (Signed)
Copied from CRM [id number]. Topic: Clinical - Medication Question >> May 22, 2023  9:08 AM Donita Brooks wrote: Reason for CRM:  pt he has a lot of congestion in head and nose is running like water, over the counter stuff is not working. pt would like for Dr. Lendon Colonel nurse Morrie Sheldon to send him something for it.

## 2023-05-29 ENCOUNTER — Encounter: Payer: Self-pay | Admitting: Family

## 2023-05-29 ENCOUNTER — Telehealth (INDEPENDENT_AMBULATORY_CARE_PROVIDER_SITE_OTHER): Payer: Medicare HMO | Admitting: Family

## 2023-05-29 DIAGNOSIS — J9611 Chronic respiratory failure with hypoxia: Secondary | ICD-10-CM | POA: Diagnosis not present

## 2023-05-29 DIAGNOSIS — F419 Anxiety disorder, unspecified: Secondary | ICD-10-CM

## 2023-05-29 DIAGNOSIS — E1142 Type 2 diabetes mellitus with diabetic polyneuropathy: Secondary | ICD-10-CM | POA: Diagnosis not present

## 2023-05-29 DIAGNOSIS — F331 Major depressive disorder, recurrent, moderate: Secondary | ICD-10-CM | POA: Diagnosis not present

## 2023-05-29 DIAGNOSIS — E782 Mixed hyperlipidemia: Secondary | ICD-10-CM

## 2023-05-29 DIAGNOSIS — J439 Emphysema, unspecified: Secondary | ICD-10-CM

## 2023-05-29 DIAGNOSIS — I10 Essential (primary) hypertension: Secondary | ICD-10-CM

## 2023-05-29 DIAGNOSIS — I252 Old myocardial infarction: Secondary | ICD-10-CM

## 2023-05-29 DIAGNOSIS — F1320 Sedative, hypnotic or anxiolytic dependence, uncomplicated: Secondary | ICD-10-CM

## 2023-05-29 DIAGNOSIS — J9621 Acute and chronic respiratory failure with hypoxia: Secondary | ICD-10-CM | POA: Diagnosis not present

## 2023-05-29 DIAGNOSIS — I509 Heart failure, unspecified: Secondary | ICD-10-CM

## 2023-05-29 DIAGNOSIS — F17210 Nicotine dependence, cigarettes, uncomplicated: Secondary | ICD-10-CM

## 2023-05-29 DIAGNOSIS — Z720 Tobacco use: Secondary | ICD-10-CM

## 2023-05-29 MED ORDER — ALPRAZOLAM 0.25 MG PO TABS: 0.2500 mg | ORAL_TABLET | Freq: Two times a day (BID) | ORAL | 2 refills | Status: DC | PRN

## 2023-05-29 MED ORDER — ROSUVASTATIN CALCIUM 20 MG PO TABS: 20.0000 mg | ORAL_TABLET | Freq: Every day | ORAL | 0 refills | Status: DC

## 2023-05-29 MED ORDER — OMEPRAZOLE 20 MG PO CPDR: 20.0000 mg | DELAYED_RELEASE_CAPSULE | Freq: Every day | ORAL | 0 refills | Status: DC

## 2023-05-29 MED ORDER — POTASSIUM CHLORIDE CRYS ER 20 MEQ PO TBCR: 20.0000 meq | EXTENDED_RELEASE_TABLET | Freq: Two times a day (BID) | ORAL | 0 refills | Status: DC

## 2023-05-29 MED ORDER — BUSPIRONE HCL 7.5 MG PO TABS: 7.5000 mg | ORAL_TABLET | Freq: Three times a day (TID) | ORAL | 1 refills | Status: DC | PRN

## 2023-05-29 NOTE — Patient Instructions (Signed)

## 2023-05-29 NOTE — Progress Notes (Signed)
Virtual Visit Consent   Ronald [name], you are scheduled for a virtual visit with a [facility] provider today. Just as with appointments in the office, your consent must be obtained to participate. Your consent will be active for this visit and any virtual visit you may have with one of our providers in the next 365 days. If you have a MyChart account, a copy of this consent can be sent to you electronically.  As this is a virtual visit, video technology does not allow for your provider to perform a traditional examination. This may limit your provider's ability to fully assess your condition. If your provider identifies any concerns that need to be evaluated in person or the need to arrange testing (such as labs, EKG, etc.), we will make arrangements to do so. Although advances in technology are sophisticated, we cannot ensure that it will always work on either your end or our end. If the connection with a video visit is poor, the visit may have to be switched to a telephone visit. With either a video or telephone visit, we are not always able to ensure that we have a secure connection.  By engaging in this virtual visit, you consent to the provision of healthcare and authorize for your insurance to be billed (if applicable) for the services provided during this visit. Depending on your insurance coverage, you may receive a charge related to this service.  I need to obtain your verbal consent now. Are you willing to proceed with your visit today? [name] has provided verbal consent on 05/29/2023 for a virtual visit (video or telephone). Ronald Rodney, FNP  Date: 05/29/2023 9:37 AM  Virtual Visit via Video Note   I, Ronald [name], connected with  Ronald [name]  ([id number], 12-Mar-1946) on 05/29/23 at  9:05 AM EST by a video-enabled telemedicine application and verified that I am speaking with the correct person using two identifiers.  Location: Patient: Virtual Visit Location Patient:  Home Provider: Virtual Visit Location Provider: Home Office   I discussed the limitations of evaluation and management by telemedicine and the availability of in person appointments. The patient expressed understanding and agreed to proceed.    History of Present Illness: Ronald [name] is a 77 y.o. who identifies as a male who was assigned male at birth, and is being seen today for chronic follow up. He is currently using 2-3 L at night and as needed.  He has COPD. Continues to smoke 1/2 pack a day. Has intermittent SOB.    He is followed by  Cardiologists for A Fib, CHF, and CAD.    Has aortic atherosclerosis and takes Crestor daily. Marland Kitchen  HPI: Hypertension This is a chronic problem. The current episode started more than 1 year ago. The problem has been resolved since onset. Associated symptoms include blurred vision, malaise/fatigue and shortness of breath. Pertinent negatives include no peripheral edema. Risk factors for coronary artery disease include dyslipidemia, diabetes mellitus, male gender and sedentary lifestyle. The current treatment provides moderate improvement. Hypertensive end-organ damage includes heart failure. There is no history of kidney disease.  Congestive Heart Failure Presents for follow-up visit. Associated symptoms include edema, fatigue and shortness of breath. The symptoms have been stable.  Diabetes He presents for his follow-up diabetic visit. He has type 2 diabetes mellitus. Associated symptoms include blurred vision and fatigue. Pertinent negatives for diabetes include no foot paresthesias. Diabetic complications include heart disease, nephropathy and peripheral neuropathy. Risk factors for coronary artery  disease include diabetes mellitus, hypertension, sedentary lifestyle, dyslipidemia and obesity. (Does not check glucose at home)  Depression        This is a chronic problem.  The current episode started more than 1 year ago.   Associated symptoms include fatigue  and sad.  Associated symptoms include no helplessness and no hopelessness.   Problems:  Patient Active Problem List   Diagnosis Date Noted   GOLD 3 COPD 07/07/2022   Prolonged QT interval 11/23/2021   Acute exacerbation of CHF (congestive heart failure) (HCC) 11/23/2021   Acute on chronic diastolic CHF (congestive heart failure) (HCC) 11/22/2021   Non-ST elevation (NSTEMI) myocardial infarction (HCC) 09/01/2021   Acute respiratory failure (HCC) 07/10/2021   Pulmonary nodule 07/10/2021   Malnutrition of moderate degree 05/13/2021   Atrial flutter (HCC) 05/01/2021   Shock circulatory (HCC) 05/01/2021   Elevated troponin 12/21/2020   Hypokalemia 12/21/2020   Mixed hyperlipidemia 12/21/2020   Acute on chronic respiratory failure with hypoxia (HCC) 12/21/2020   Chest pain 05/23/2020   Controlled substance agreement signed 04/03/2020   Benzodiazepine dependence (HCC) 04/03/2020   Dysphagia 02/27/2020   Tracheal stenosis 12/09/2019   Lymphedema 04/01/2019   CHF (congestive heart failure) (HCC) 08/27/2018   Aortic atherosclerosis (HCC) 05/11/2017   Depression 05/05/2017   GERD (gastroesophageal reflux disease) 01/16/2017   Diabetic neuropathy (HCC) 01/16/2017   Anterolisthesis 12/04/2016   History of MI (myocardial infarction) 12/04/2016   OSA (obstructive sleep apnea) 12/04/2016   Closed fracture dislocation of lumbar spine (HCC) 07/25/2016   Closed fracture of body of sternum 07/25/2016   Multiple fractures of cervical spine (HCC) 07/25/2016   Anxiety 02/14/2016   Chronic respiratory failure with hypoxia (HCC) 07/19/2014   Essential hypertension 07/19/2014   Cigarette smoker 07/19/2014   CAD in native artery 06/22/2014   Diabetes mellitus (HCC) 06/22/2014   Tobacco abuse 06/22/2014   Acute encephalopathy 06/22/2014   COPD (chronic obstructive pulmonary disease) with emphysema (HCC) 06/22/2014    Allergies:  Allergies  Allergen Reactions   Gabapentin Anxiety    Unknown  reaction   Metformin And Related Rash   Medications:  Current Outpatient Medications:    acetaminophen (TYLENOL) 325 MG tablet, Take 2 tablets (650 mg total) by mouth every 6 (six) hours as needed for mild pain (or Fever >/= 101)., Disp: 12 tablet, Rfl: 0   albuterol (VENTOLIN HFA) 108 (90 Base) MCG/ACT inhaler, Inhale 2 puffs into the lungs every 6 (six) hours as needed for wheezing or shortness of breath., Disp: 8 g, Rfl: 0   ALPRAZolam (XANAX) 0.25 MG tablet, Take 1 tablet (0.25 mg total) by mouth 2 (two) times daily as needed for anxiety., Disp: 60 tablet, Rfl: 2   amiodarone (PACERONE) 200 MG tablet, Take 1 tablet by mouth once daily, Disp: 90 tablet, Rfl: 0   apixaban (ELIQUIS) 5 MG TABS tablet, Take 1 tablet (5 mg total) by mouth 2 (two) times daily. (Patient not taking: Reported on 02/23/2023), Disp: 28 tablet, Rfl: 0   benzonatate (TESSALON) 200 MG capsule, Take 1 capsule (200 mg total) by mouth 3 (three) times daily as needed., Disp: 30 capsule, Rfl: 1   bisoprolol (ZEBETA) 5 MG tablet, TAKE 1 TABLET BY MOUTH ONCE DAILY IN  PLACE  OF  METOPROLOL, Disp: 90 tablet, Rfl: 0   Budeson-Glycopyrrol-Formoterol (BREZTRI AEROSPHERE) 160-9-4.8 MCG/ACT AERO, Inhale 2 puffs into the lungs 2 (two) times daily., Disp: 32.1 g, Rfl: 11   budesonide (PULMICORT) 0.5 MG/2ML nebulizer solution, Take 2 mLs (  0.5 mg total) by nebulization 2 (two) times daily., Disp: 100 mL, Rfl: 12   buPROPion (WELLBUTRIN XL) 150 MG 24 hr tablet, Take 1 tablet by mouth once daily, Disp: 90 tablet, Rfl: 0   busPIRone (BUSPAR) 7.5 MG tablet, Take 1 tablet (7.5 mg total) by mouth 3 (three) times daily as needed., Disp: 90 tablet, Rfl: 1   dapagliflozin propanediol (FARXIGA) 10 MG TABS tablet, Take 1 tablet (10 mg total) by mouth daily before breakfast., Disp: 90 tablet, Rfl: 5   doxycycline (VIBRA-TABS) 100 MG tablet, Take 1 tablet (100 mg total) by mouth 2 (two) times daily., Disp: 20 tablet, Rfl: 0   furosemide (LASIX) 40 MG  tablet, Take 1 tablet by mouth twice daily, Disp: 180 tablet, Rfl: 0   glucose blood (ONE TOUCH ULTRA TEST) test strip, USE TO CHECK GLUCOSE ONCE DAILY, Disp: 100 each, Rfl: 3   ipratropium-albuterol (DUONEB) 0.5-2.5 (3) MG/3ML SOLN, Take 3 mLs by nebulization every 6 (six) hours as needed (asthma)., Disp: 360 mL, Rfl: 3   isosorbide mononitrate (IMDUR) 60 MG 24 hr tablet, Take 1 tablet (60 mg total) by mouth daily., Disp: 30 tablet, Rfl: 3   nitroGLYCERIN (NITROSTAT) 0.4 MG SL tablet, Place 1 tablet (0.4 mg total) under the tongue every 5 (five) minutes x 3 doses as needed for chest pain (if no relief after 2nd dose, proceed to ED or call 911)., Disp: 25 tablet, Rfl: 3   omeprazole (PRILOSEC) 20 MG capsule, Take 1 capsule (20 mg total) by mouth daily., Disp: 90 capsule, Rfl: 0   OXYGEN, Inhale 3 L into the lungs at bedtime as needed (shortness of breath)., Disp: , Rfl:    polyethylene glycol (MIRALAX / GLYCOLAX) 17 g packet, Take 17 g by mouth daily., Disp: 30 each, Rfl: 2   potassium chloride SA (KLOR-CON M) 20 MEQ tablet, Take 1 tablet (20 mEq total) by mouth 2 (two) times daily., Disp: 180 tablet, Rfl: 0   rosuvastatin (CRESTOR) 20 MG tablet, Take 1 tablet (20 mg total) by mouth daily., Disp: 90 tablet, Rfl: 0  Observations/Objective: Patient is well-developed, well-nourished in no acute distress.  Resting comfortably  at home.  Head is normocephalic, atraumatic.  No labored breathing.  Speech is clear and coherent with logical content.  Patient is alert and oriented at baseline.  HOH Coarse nonproductive cough  Assessment and Plan: 1. Anxiety - ALPRAZolam (XANAX) 0.25 MG tablet; Take 1 tablet (0.25 mg total) by mouth 2 (two) times daily as needed for anxiety.  Dispense: 60 tablet; Refill: 2 - busPIRone (BUSPAR) 7.5 MG tablet; Take 1 tablet (7.5 mg total) by mouth 3 (three) times daily as needed.  Dispense: 90 tablet; Refill: 1  2. Benzodiazepine dependence (HCC) - ALPRAZolam (XANAX)  0.25 MG tablet; Take 1 tablet (0.25 mg total) by mouth 2 (two) times daily as needed for anxiety.  Dispense: 60 tablet; Refill: 2  3. Type 2 diabetes mellitus with diabetic polyneuropathy, without long-term current use of insulin (HCC) (Primary)  4. Tobacco abuse  5. Chronic respiratory failure with hypoxia (HCC)  6. History of MI (myocardial infarction)  7. Mixed hyperlipidemia - rosuvastatin (CRESTOR) 20 MG tablet; Take 1 tablet (20 mg total) by mouth daily.  Dispense: 90 tablet; Refill: 0  8. Pulmonary emphysema, unspecified emphysema type (HCC)  9. Essential hypertension  10. Cigarette smoker  11. Moderate episode of recurrent major depressive disorder (HCC)  12. Congestive heart failure, unspecified HF chronicity, unspecified heart failure type (HCC)  13.  Acute on chronic respiratory failure with hypoxia (HCC)  Continue current medications  Labs reviewed from last visit, will get on next visit Continue to decrease smoking  Patient reviewed in [place] controlled database, no flags noted. Contract and drug screen are up to date.  Follow up in 3 months    Follow Up Instructions: I discussed the assessment and treatment plan with the patient. The patient was provided an opportunity to ask questions and all were answered. The patient agreed with the plan and demonstrated an understanding of the instructions.  A copy of instructions were sent to the patient via MyChart unless otherwise noted below.     The patient was advised to call back or seek an in-person evaluation if the symptoms worsen or if the condition fails to improve as anticipated.    Ronald Rodney, FNP

## 2023-06-04 DIAGNOSIS — I5030 Unspecified diastolic (congestive) heart failure: Secondary | ICD-10-CM | POA: Diagnosis not present

## 2023-06-05 ENCOUNTER — Ambulatory Visit: Payer: Medicare HMO | Admitting: Family

## 2023-06-06 ENCOUNTER — Encounter (HOSPITAL_COMMUNITY): Payer: Self-pay

## 2023-06-06 ENCOUNTER — Emergency Department (HOSPITAL_COMMUNITY): Payer: Medicare HMO

## 2023-06-06 ENCOUNTER — Inpatient Hospital Stay (HOSPITAL_COMMUNITY)
Admission: EM | Admit: 2023-06-06 | Discharge: 2023-06-09 | DRG: 191 | Disposition: A | Discharge status: Home Health | Payer: Medicare HMO | Attending: Internal Medicine | Admitting: Internal Medicine

## 2023-06-06 ENCOUNTER — Other Ambulatory Visit: Payer: Self-pay

## 2023-06-06 DIAGNOSIS — R0789 Other chest pain: Secondary | ICD-10-CM | POA: Diagnosis not present

## 2023-06-06 DIAGNOSIS — Z825 Family history of asthma and other chronic lower respiratory diseases: Secondary | ICD-10-CM

## 2023-06-06 DIAGNOSIS — F32A Depression, unspecified: Secondary | ICD-10-CM | POA: Diagnosis present

## 2023-06-06 DIAGNOSIS — J449 Chronic obstructive pulmonary disease, unspecified: Secondary | ICD-10-CM | POA: Diagnosis present

## 2023-06-06 DIAGNOSIS — J9611 Chronic respiratory failure with hypoxia: Secondary | ICD-10-CM | POA: Diagnosis present

## 2023-06-06 DIAGNOSIS — I10 Essential (primary) hypertension: Secondary | ICD-10-CM | POA: Diagnosis present

## 2023-06-06 DIAGNOSIS — I480 Paroxysmal atrial fibrillation: Secondary | ICD-10-CM | POA: Diagnosis present

## 2023-06-06 DIAGNOSIS — Z801 Family history of malignant neoplasm of trachea, bronchus and lung: Secondary | ICD-10-CM

## 2023-06-06 DIAGNOSIS — Z7901 Long term (current) use of anticoagulants: Secondary | ICD-10-CM

## 2023-06-06 DIAGNOSIS — Z8249 Family history of ischemic heart disease and other diseases of the circulatory system: Secondary | ICD-10-CM

## 2023-06-06 DIAGNOSIS — I252 Old myocardial infarction: Secondary | ICD-10-CM

## 2023-06-06 DIAGNOSIS — Z79899 Other long term (current) drug therapy: Secondary | ICD-10-CM

## 2023-06-06 DIAGNOSIS — N1832 Chronic kidney disease, stage 3b: Secondary | ICD-10-CM | POA: Insufficient documentation

## 2023-06-06 DIAGNOSIS — Z9049 Acquired absence of other specified parts of digestive tract: Secondary | ICD-10-CM

## 2023-06-06 DIAGNOSIS — B9710 Unspecified enterovirus as the cause of diseases classified elsewhere: Secondary | ICD-10-CM | POA: Diagnosis present

## 2023-06-06 DIAGNOSIS — Z7951 Long term (current) use of inhaled steroids: Secondary | ICD-10-CM

## 2023-06-06 DIAGNOSIS — T380X5A Adverse effect of glucocorticoids and synthetic analogues, initial encounter: Secondary | ICD-10-CM | POA: Diagnosis not present

## 2023-06-06 DIAGNOSIS — I509 Heart failure, unspecified: Secondary | ICD-10-CM

## 2023-06-06 DIAGNOSIS — I4892 Unspecified atrial flutter: Secondary | ICD-10-CM | POA: Diagnosis present

## 2023-06-06 DIAGNOSIS — Z823 Family history of stroke: Secondary | ICD-10-CM

## 2023-06-06 DIAGNOSIS — B9789 Other viral agents as the cause of diseases classified elsewhere: Secondary | ICD-10-CM | POA: Diagnosis present

## 2023-06-06 DIAGNOSIS — J441 Chronic obstructive pulmonary disease with (acute) exacerbation: Secondary | ICD-10-CM | POA: Diagnosis not present

## 2023-06-06 DIAGNOSIS — Z1152 Encounter for screening for COVID-19: Secondary | ICD-10-CM

## 2023-06-06 DIAGNOSIS — Z8673 Personal history of transient ischemic attack (TIA), and cerebral infarction without residual deficits: Secondary | ICD-10-CM

## 2023-06-06 DIAGNOSIS — E782 Mixed hyperlipidemia: Secondary | ICD-10-CM | POA: Diagnosis present

## 2023-06-06 DIAGNOSIS — E860 Dehydration: Secondary | ICD-10-CM | POA: Diagnosis present

## 2023-06-06 DIAGNOSIS — F17210 Nicotine dependence, cigarettes, uncomplicated: Secondary | ICD-10-CM | POA: Diagnosis present

## 2023-06-06 DIAGNOSIS — N189 Chronic kidney disease, unspecified: Secondary | ICD-10-CM | POA: Diagnosis not present

## 2023-06-06 DIAGNOSIS — I2489 Other forms of acute ischemic heart disease: Secondary | ICD-10-CM | POA: Diagnosis present

## 2023-06-06 DIAGNOSIS — N179 Acute kidney failure, unspecified: Principal | ICD-10-CM | POA: Diagnosis present

## 2023-06-06 DIAGNOSIS — Z806 Family history of leukemia: Secondary | ICD-10-CM

## 2023-06-06 DIAGNOSIS — R7989 Other specified abnormal findings of blood chemistry: Secondary | ICD-10-CM | POA: Diagnosis present

## 2023-06-06 DIAGNOSIS — F419 Anxiety disorder, unspecified: Secondary | ICD-10-CM | POA: Diagnosis present

## 2023-06-06 DIAGNOSIS — I129 Hypertensive chronic kidney disease with stage 1 through stage 4 chronic kidney disease, or unspecified chronic kidney disease: Secondary | ICD-10-CM | POA: Diagnosis present

## 2023-06-06 DIAGNOSIS — Y92239 Unspecified place in hospital as the place of occurrence of the external cause: Secondary | ICD-10-CM | POA: Diagnosis not present

## 2023-06-06 DIAGNOSIS — E1122 Type 2 diabetes mellitus with diabetic chronic kidney disease: Secondary | ICD-10-CM | POA: Diagnosis present

## 2023-06-06 DIAGNOSIS — R079 Chest pain, unspecified: Secondary | ICD-10-CM | POA: Diagnosis not present

## 2023-06-06 DIAGNOSIS — Z951 Presence of aortocoronary bypass graft: Secondary | ICD-10-CM

## 2023-06-06 DIAGNOSIS — Z9981 Dependence on supplemental oxygen: Secondary | ICD-10-CM

## 2023-06-06 DIAGNOSIS — Z955 Presence of coronary angioplasty implant and graft: Secondary | ICD-10-CM

## 2023-06-06 DIAGNOSIS — K219 Gastro-esophageal reflux disease without esophagitis: Secondary | ICD-10-CM | POA: Diagnosis present

## 2023-06-06 DIAGNOSIS — Z888 Allergy status to other drugs, medicaments and biological substances status: Secondary | ICD-10-CM

## 2023-06-06 DIAGNOSIS — E119 Type 2 diabetes mellitus without complications: Secondary | ICD-10-CM

## 2023-06-06 DIAGNOSIS — I2510 Atherosclerotic heart disease of native coronary artery without angina pectoris: Secondary | ICD-10-CM | POA: Diagnosis present

## 2023-06-06 DIAGNOSIS — I5030 Unspecified diastolic (congestive) heart failure: Secondary | ICD-10-CM

## 2023-06-06 DIAGNOSIS — A084 Viral intestinal infection, unspecified: Secondary | ICD-10-CM | POA: Diagnosis present

## 2023-06-06 DIAGNOSIS — I517 Cardiomegaly: Secondary | ICD-10-CM | POA: Diagnosis not present

## 2023-06-06 DIAGNOSIS — G4733 Obstructive sleep apnea (adult) (pediatric): Secondary | ICD-10-CM | POA: Diagnosis present

## 2023-06-06 LAB — BASIC METABOLIC PANEL
Anion gap: 11 (ref 5–15)
Anion gap: 10 (ref 5–15)
BUN: 43 mg/dL — ABNORMAL HIGH (ref 8–23)
BUN: 43 mg/dL — ABNORMAL HIGH (ref 8–23)
CO2: 24 mmol/L (ref 22–32)
CO2: 24 mmol/L (ref 22–32)
Calcium: 8.7 mg/dL — ABNORMAL LOW (ref 8.9–10.3)
Calcium: 8.8 mg/dL — ABNORMAL LOW (ref 8.9–10.3)
Chloride: 104 mmol/L (ref 98–111)
Chloride: 104 mmol/L (ref 98–111)
Creatinine, Ser: 2.57 mg/dL — ABNORMAL HIGH (ref 0.61–1.24)
Creatinine, Ser: 2.56 mg/dL — ABNORMAL HIGH (ref 0.61–1.24)
GFR, Estimated: 25 mL/min — ABNORMAL LOW (ref >60)
GFR, Estimated: 25 mL/min — ABNORMAL LOW (ref >60)
Glucose, Bld: 108 mg/dL — ABNORMAL HIGH (ref 70–99)
Glucose, Bld: 107 mg/dL — ABNORMAL HIGH (ref 70–99)
Potassium: 3.9 mmol/L (ref 3.5–5.1)
Potassium: 3.8 mmol/L (ref 3.5–5.1)
Sodium: 139 mmol/L (ref 135–145)
Sodium: 138 mmol/L (ref 135–145)

## 2023-06-06 LAB — CBC
HCT: 43.4 % (ref 39.0–52.0)
Hemoglobin: 14.2 g/dL (ref 13.0–17.0)
MCH: 27.9 pg (ref 26.0–34.0)
MCHC: 32.7 g/dL (ref 30.0–36.0)
MCV: 85.3 fL (ref 80.0–100.0)
Platelets: 359 10*3/uL (ref 150–400)
RBC: 5.09 MIL/uL (ref 4.22–5.81)
RDW: 15 % (ref 11.5–15.5)
WBC: 9.8 10*3/uL (ref 4.0–10.5)
nRBC: 0 % (ref 0.0–0.2)

## 2023-06-06 LAB — TROPONIN I (HIGH SENSITIVITY)
Troponin I (High Sensitivity): 76 ng/L — ABNORMAL HIGH (ref <18)
Troponin I (High Sensitivity): 69 ng/L — ABNORMAL HIGH (ref <18)

## 2023-06-06 LAB — GLUCOSE, CAPILLARY: Glucose-Capillary: 78 mg/dL (ref 70–99)

## 2023-06-06 MED ORDER — BUDESON-GLYCOPYRROL-FORMOTEROL 160-9-4.8 MCG/ACT IN AERO
2.0000 | INHALATION_SPRAY | Freq: Two times a day (BID) | RESPIRATORY_TRACT | Status: DC
Start: 2023-06-06 — End: 2023-06-06

## 2023-06-06 MED ORDER — INSULIN ASPART 100 UNIT/ML IJ SOLN: 0.0000 [IU] | Freq: Every day | INTRAMUSCULAR | Status: DC

## 2023-06-06 MED ORDER — BUPROPION HCL ER (XL) 150 MG PO TB24
150.0000 mg | ORAL_TABLET | Freq: Every day | ORAL | Status: DC
  Administered 2023-06-07 – 2023-06-09 (×3): 150 mg via ORAL
  Filled 2023-06-06 (×3): qty 1

## 2023-06-06 MED ORDER — ALPRAZOLAM 0.5 MG PO TABS
0.2500 mg | ORAL_TABLET | Freq: Two times a day (BID) | ORAL | Status: DC | PRN
  Administered 2023-06-08 – 2023-06-09 (×2): 0.25 mg via ORAL
  Filled 2023-06-06 (×2): qty 1

## 2023-06-06 MED ORDER — PROCHLORPERAZINE EDISYLATE 10 MG/2ML IJ SOLN
5.0000 mg | Freq: Once | INTRAMUSCULAR | Status: AC
  Administered 2023-06-06: 5 mg via INTRAVENOUS
  Filled 2023-06-06: qty 2

## 2023-06-06 MED ORDER — BISOPROLOL FUMARATE 5 MG PO TABS
5.0000 mg | ORAL_TABLET | Freq: Every day | ORAL | Status: DC
  Administered 2023-06-07 – 2023-06-09 (×3): 5 mg via ORAL
  Filled 2023-06-06 (×3): qty 1

## 2023-06-06 MED ORDER — ROSUVASTATIN CALCIUM 20 MG PO TABS
20.0000 mg | ORAL_TABLET | Freq: Every day | ORAL | Status: DC
  Administered 2023-06-07 – 2023-06-09 (×3): 20 mg via ORAL
  Filled 2023-06-06 (×3): qty 1

## 2023-06-06 MED ORDER — FENTANYL CITRATE PF 50 MCG/ML IJ SOSY
50.0000 ug | PREFILLED_SYRINGE | Freq: Once | INTRAMUSCULAR | Status: AC
  Administered 2023-06-06: 50 ug via INTRAVENOUS
  Filled 2023-06-06: qty 1

## 2023-06-06 MED ORDER — MOMETASONE FURO-FORMOTEROL FUM 100-5 MCG/ACT IN AERO
2.0000 | INHALATION_SPRAY | Freq: Two times a day (BID) | RESPIRATORY_TRACT | Status: DC
  Administered 2023-06-06 – 2023-06-07 (×2): 2 via RESPIRATORY_TRACT
  Filled 2023-06-06: qty 8.8

## 2023-06-06 MED ORDER — UMECLIDINIUM BROMIDE 62.5 MCG/ACT IN AEPB
1.0000 | INHALATION_SPRAY | Freq: Every day | RESPIRATORY_TRACT | Status: DC
  Administered 2023-06-07: 1 via RESPIRATORY_TRACT
  Filled 2023-06-06: qty 7

## 2023-06-06 MED ORDER — PANTOPRAZOLE SODIUM 40 MG PO TBEC
40.0000 mg | DELAYED_RELEASE_TABLET | Freq: Every day | ORAL | Status: DC
  Administered 2023-06-07: 40 mg via ORAL
  Filled 2023-06-06: qty 1

## 2023-06-06 MED ORDER — ISOSORBIDE MONONITRATE ER 60 MG PO TB24
60.0000 mg | ORAL_TABLET | Freq: Every day | ORAL | Status: DC
  Administered 2023-06-07 – 2023-06-09 (×3): 60 mg via ORAL
  Filled 2023-06-06 (×3): qty 1

## 2023-06-06 MED ORDER — INSULIN ASPART 100 UNIT/ML IJ SOLN
0.0000 [IU] | Freq: Three times a day (TID) | INTRAMUSCULAR | Status: DC
  Administered 2023-06-08: 2 [IU] via SUBCUTANEOUS
  Administered 2023-06-08 – 2023-06-09 (×2): 3 [IU] via SUBCUTANEOUS

## 2023-06-06 MED ORDER — LACTATED RINGERS IV SOLN: INTRAVENOUS | Status: AC

## 2023-06-06 MED ORDER — ENOXAPARIN SODIUM 30 MG/0.3ML IJ SOSY
30.0000 mg | PREFILLED_SYRINGE | INTRAMUSCULAR | Status: DC
  Administered 2023-06-06: 30 mg via SUBCUTANEOUS
  Filled 2023-06-06: qty 0.3

## 2023-06-06 MED ORDER — AMIODARONE HCL 200 MG PO TABS
200.0000 mg | ORAL_TABLET | Freq: Every day | ORAL | Status: DC
  Administered 2023-06-07 – 2023-06-09 (×3): 200 mg via ORAL
  Filled 2023-06-06 (×3): qty 1

## 2023-06-06 MED ORDER — ALBUTEROL SULFATE (2.5 MG/3ML) 0.083% IN NEBU
3.0000 mL | INHALATION_SOLUTION | Freq: Four times a day (QID) | RESPIRATORY_TRACT | Status: DC | PRN
  Administered 2023-06-06 – 2023-06-07 (×2): 3 mL via RESPIRATORY_TRACT
  Filled 2023-06-06 (×2): qty 3

## 2023-06-06 MED ORDER — LACTATED RINGERS IV BOLUS
500.0000 mL | Freq: Once | INTRAVENOUS | Status: AC
  Administered 2023-06-06: 500 mL via INTRAVENOUS

## 2023-06-06 MED ORDER — PROCHLORPERAZINE EDISYLATE 10 MG/2ML IJ SOLN: 10.0000 mg | Freq: Four times a day (QID) | INTRAMUSCULAR | Status: DC | PRN

## 2023-06-06 MED ORDER — DOXYCYCLINE HYCLATE 100 MG PO TABS
100.0000 mg | ORAL_TABLET | Freq: Two times a day (BID) | ORAL | Status: AC
  Administered 2023-06-06: 100 mg via ORAL
  Filled 2023-06-06: qty 1

## 2023-06-06 MED ORDER — DIAZEPAM 5 MG/ML IJ SOLN
2.5000 mg | Freq: Once | INTRAMUSCULAR | Status: AC
  Administered 2023-06-06: 2.5 mg via INTRAVENOUS
  Filled 2023-06-06: qty 2

## 2023-06-06 NOTE — H&P (Signed)
History and Physical    Patient: Ronald [name] YQM:[id number] DOB: 1945/08/25 DOA: 06/06/2023 DOS: the patient was seen and examined on 06/06/2023 PCP: [name], FNP  Patient coming from: Home  Chief Complaint:  Chief Complaint  Patient presents with   Chest Pain   HPI: Ronald [name] is a 77 y.o. male with medical history significant of atrial flutter, COPD, history of NSTEMI with coronary artery disease status post stenting, GERD, hypertension, diabetes, chronic kidney disease, chronic respiratory failure on home oxygen.  Patient has been having nausea and vomiting since this morning.  He has been intolerant of oral food and liquid.  He came to the hospital for evaluation.  No fevers, chills.  His breathing is at baseline.  No cough, chest pain, shortness of breath.  His abdomen is sore, but nontender.  Review of Systems: As mentioned in the history of present illness. All other systems reviewed and are negative. Past Medical History:  Diagnosis Date   Anxiety    Asthma    Atrial flutter (HCC) 04/2021   Chronic diastolic CHF (congestive heart failure) (HCC)    Chronic lower back pain    COPD (chronic obstructive pulmonary disease) (HCC)    Coronary artery disease    a. NSTEMI 05/2014 s/p DESx2 to LAD at [facility].   Depression    GERD (gastroesophageal reflux disease)    High cholesterol    History of tracheostomy    Hypertension    NSTEMI (non-ST elevated myocardial infarction) (HCC) 05/2014   with stent placement   PAF (paroxysmal atrial fibrillation) (HCC)    Sleep apnea    Stroke (HCC) 2017   anyeusym    TIA (transient ischemic attack)    "they say I've had some mini strokes; don't know when"; denies residual on 06/22/2014)   Tobacco abuse    Type II diabetes mellitus (HCC)    Ulcerative colitis (HCC)    Past Surgical History:  Procedure Laterality Date   APPENDECTOMY     BIOPSY  07/20/2020   Procedure: BIOPSY;  Surgeon: Dolores Frame, MD;   Location: [facility];  Service: Gastroenterology;;   CARDIAC CATHETERIZATION  [phone] X 3   CHOLECYSTECTOMY     COLONOSCOPY WITH PROPOFOL N/A 07/20/2020   Procedure: COLONOSCOPY WITH PROPOFOL;  Surgeon: Dolores Frame, MD;  Location: [facility];  Service: Gastroenterology;  Laterality: N/A;  1:15   CORONARY ANGIOPLASTY WITH STENT PLACEMENT  05/2014   "2"   ESOPHAGEAL DILATION N/A 07/20/2020   Procedure: ESOPHAGEAL DILATION;  Surgeon: Dolores Frame, MD;  Location: [facility];  Service: Gastroenterology;  Laterality: N/A;   ESOPHAGOGASTRODUODENOSCOPY (EGD) WITH PROPOFOL N/A 07/20/2020   Procedure: ESOPHAGOGASTRODUODENOSCOPY (EGD) WITH PROPOFOL;  Surgeon: Dolores Frame, MD;  [facility]: [facility];  Service: Gastroenterology;  Laterality: N/A;   IR GASTROSTOMY TUBE MOD SED  06/04/2021   IR GASTROSTOMY TUBE REMOVAL  07/24/2021   LEFT HEART CATH AND CORONARY ANGIOGRAPHY N/A 05/25/2020   Procedure: LEFT HEART CATH AND CORONARY ANGIOGRAPHY;  Surgeon: [place], Peter M, MD;  Location: [facility] INVASIVE CV LAB;  Service: Cardiovascular;  Laterality: N/A;   POLYPECTOMY  07/20/2020   Procedure: POLYPECTOMY INTESTINAL;  Surgeon: Dolores Frame, MD;  Location: [facility];  Service: Gastroenterology;;   TUMOR EXCISION Right ~ 1999   "side of my upper head"   Social History:  reports that he has been smoking cigarettes. He started smoking about 57 years ago. He has a 27.7 pack-year  smoking history. He has never been exposed to tobacco smoke. He has never used smokeless tobacco. He reports that he does not currently use alcohol. He reports that he does not use drugs.  Allergies  Allergen Reactions   Gabapentin Anxiety    Unknown reaction   Metformin And Related Rash    Family History  Problem Relation Age of Onset   CAD Father    Lung cancer Brother        smoked   Cancer Brother        lung   Leukemia Sister    Dementia Sister    Stroke Mother     Emphysema Sister    Cancer Brother        lung    Prior to Admission medications   Medication Sig Start Date End Date Taking? Authorizing Provider  acetaminophen (TYLENOL) 325 MG tablet Take 2 tablets (650 mg total) by mouth every 6 (six) hours as needed for mild pain (or Fever >/= 101). 12/10/21  Yes Emokpae, Courage, MD  albuterol (VENTOLIN HFA) 108 (90 Base) MCG/ACT inhaler Inhale 2 puffs into the lungs every 6 (six) hours as needed for wheezing or shortness of breath. 05/22/23  Yes Hawks, Christy A, FNP  ALPRAZolam (XANAX) 0.25 MG tablet Take 1 tablet (0.25 mg total) by mouth 2 (two) times daily as needed for anxiety. 05/29/23  Yes Hawks, Christy A, FNP  amiodarone (PACERONE) 200 MG tablet Take 1 tablet by mouth once daily Patient taking differently: Take 200 mg by mouth daily. 04/23/23  Yes Hawks, Christy A, FNP  benzonatate (TESSALON) 200 MG capsule Take 1 capsule (200 mg total) by mouth 3 (three) times daily as needed. 05/22/23  Yes Hawks, Christy A, FNP  bisoprolol (ZEBETA) 5 MG tablet TAKE 1 TABLET BY MOUTH ONCE DAILY IN  PLACE  OF  METOPROLOL Patient taking differently: Take 5 mg by mouth daily. 02/24/23  Yes Hawks, Christy A, FNP  Budeson-Glycopyrrol-Formoterol (BREZTRI AEROSPHERE) 160-9-4.8 MCG/ACT AERO Inhale 2 puffs into the lungs 2 (two) times daily. 04/14/23  Yes Hawks, Neysa Bonito A, FNP  buPROPion (WELLBUTRIN XL) 150 MG 24 hr tablet Take 1 tablet by mouth once daily 02/24/23  Yes Hawks, Christy A, FNP  busPIRone (BUSPAR) 7.5 MG tablet Take 1 tablet (7.5 mg total) by mouth 3 (three) times daily as needed. 05/29/23  Yes Hawks, Neysa Bonito A, FNP  dapagliflozin propanediol (FARXIGA) 10 MG TABS tablet Take 1 tablet (10 mg total) by mouth daily before breakfast. 04/14/23  Yes Hawks, Christy A, FNP  doxycycline (VIBRA-TABS) 100 MG tablet Take 1 tablet (100 mg total) by mouth 2 (two) times daily. 05/22/23  Yes Hawks, Neysa Bonito A, FNP  furosemide (LASIX) 40 MG tablet Take 1 tablet by mouth twice daily  03/31/23  Yes Hawks, Christy A, FNP  ipratropium-albuterol (DUONEB) 0.5-2.5 (3) MG/3ML SOLN Take 3 mLs by nebulization every 6 (six) hours as needed (asthma). 03/13/23  Yes Hawks, Christy A, FNP  isosorbide mononitrate (IMDUR) 60 MG 24 hr tablet Take 1 tablet (60 mg total) by mouth daily. 04/29/23  Yes Branch, Dorothe Pea, MD  nitroGLYCERIN (NITROSTAT) 0.4 MG SL tablet Place 1 tablet (0.4 mg total) under the tongue every 5 (five) minutes x 3 doses as needed for chest pain (if no relief after 2nd dose, proceed to ED or call 911). 04/29/23  Yes BranchDorothe Pea, MD  omeprazole (PRILOSEC) 20 MG capsule Take 1 capsule (20 mg total) by mouth daily. 05/29/23  Yes [name], FNP  OXYGEN Inhale 4 L into the lungs at bedtime as needed (shortness of breath).   Yes [provider]  potassium chloride SA (KLOR-CON M) 20 MEQ tablet Take 1 tablet (20 mEq total) by mouth 2 (two) times daily. 05/29/23  Yes Hawks, Christy A, FNP  rosuvastatin (CRESTOR) 20 MG tablet Take 1 tablet (20 mg total) by mouth daily. 05/29/23  Yes Hawks, Christy A, FNP  budesonide (PULMICORT) 0.5 MG/2ML nebulizer solution Take 2 mLs (0.5 mg total) by nebulization 2 (two) times daily. 12/10/21   Shon Hale, MD  polyethylene glycol (MIRALAX / GLYCOLAX) 17 g packet Take 17 g by mouth daily. Patient taking differently: Take 17 g by mouth daily as needed for mild constipation or moderate constipation. 12/10/21   Shon Hale, MD    Physical Exam: Vitals:   06/06/23 1800 06/06/23 1830 06/06/23 1900 06/06/23 2030  BP: (!) 178/63 (!) 148/69 (!) 159/71 (!) 174/69  Pulse: (!) 56     Resp:      Temp:      TempSrc:      SpO2: 99%     Weight:      Height:       General: elderly male. Awake and alert and oriented x3. No acute cardiopulmonary distress.  HEENT: Normocephalic atraumatic.  Right and left ears normal in appearance.  Pupils equal, round, reactive to light. Extraocular muscles are intact. Sclerae anicteric and  noninjected.  Moist mucosal membranes. No mucosal lesions.  Neck: Neck supple without lymphadenopathy. No carotid bruits. No masses palpated.  Cardiovascular: Regular rate with normal S1-S2 sounds. No murmurs, rubs, gallops auscultated. No JVD.  Respiratory: Good respiratory effort with no wheezes, rales, rhonchi. Lungs clear to auscultation bilaterally.  No accessory muscle use. Abdomen: Soft, nontender, nondistended. Active bowel sounds. No masses or hepatosplenomegaly  Skin: No rashes, lesions, or ulcerations.  Dry, warm to touch. 2+ dorsalis pedis and radial pulses. Musculoskeletal: No calf or leg pain. All major joints not erythematous nontender.  No upper or lower joint deformation.  Good ROM.  No contractures  Psychiatric: Intact judgment and insight. Pleasant and cooperative. Neurologic: No focal neurological deficits. Strength is 5/5 and symmetric in upper and lower extremities.  Cranial nerves II through XII are grossly intact.   Data Reviewed: Results for orders placed or performed during the hospital encounter of 06/06/23 (from the past 24 hours)  Basic metabolic panel     Status: Abnormal   Collection Time: 06/06/23  3:52 PM  Result Value Ref Range   Sodium 139 135 - 145 mmol/L   Potassium 3.9 3.5 - 5.1 mmol/L   Chloride 104 98 - 111 mmol/L   CO2 24 22 - 32 mmol/L   Glucose, Bld 108 (H) 70 - 99 mg/dL   BUN 43 (H) 8 - 23 mg/dL   Creatinine, Ser 1.61 (H) 0.61 - 1.24 mg/dL   Calcium 8.7 (L) 8.9 - 10.3 mg/dL   GFR, Estimated 25 (L) >60 mL/min   Anion gap 11 5 - 15  CBC     Status: None   Collection Time: 06/06/23  3:52 PM  Result Value Ref Range   WBC 9.8 4.0 - 10.5 K/uL   RBC 5.09 4.22 - 5.81 MIL/uL   Hemoglobin 14.2 13.0 - 17.0 g/dL   HCT 09.6 04.5 - 40.9 %   MCV 85.3 80.0 - 100.0 fL   MCH 27.9 26.0 - 34.0 pg   MCHC 32.7 30.0 - 36.0 g/dL   RDW 81.1 91.4 - 78.2 %  Platelets 359 150 - 400 K/uL   nRBC 0.0 0.0 - 0.2 %  Troponin I (High Sensitivity)     Status: Abnormal    Collection Time: 06/06/23  3:52 PM  Result Value Ref Range   Troponin I (High Sensitivity) 76 (H) <18 ng/L  Troponin I (High Sensitivity)     Status: Abnormal   Collection Time: 06/06/23  5:58 PM  Result Value Ref Range   Troponin I (High Sensitivity) 69 (H) <18 ng/L  Basic metabolic panel     Status: Abnormal   Collection Time: 06/06/23  5:58 PM  Result Value Ref Range   Sodium 138 135 - 145 mmol/L   Potassium 3.8 3.5 - 5.1 mmol/L   Chloride 104 98 - 111 mmol/L   CO2 24 22 - 32 mmol/L   Glucose, Bld 107 (H) 70 - 99 mg/dL   BUN 43 (H) 8 - 23 mg/dL   Creatinine, Ser 1.91 (H) 0.61 - 1.24 mg/dL   Calcium 8.8 (L) 8.9 - 10.3 mg/dL   GFR, Estimated 25 (L) >60 mL/min   Anion gap 10 5 - 15    DG Chest 2 View Result Date: 06/06/2023 CLINICAL DATA:  Acute left-sided chest pain beginning 3 hours ago. Shortness of breath. EXAM: CHEST - 2 VIEW COMPARISON:  05/14/2022 FINDINGS: Stable mild cardiomegaly. Stable changes of COPD. Both lungs are clear. The visualized skeletal structures are unremarkable. IMPRESSION: No active cardiopulmonary disease. COPD. Electronically Signed   By: Danae Orleans M.D.   On: 06/06/2023 17:09     Assessment and Plan: No notes have been filed under this hospital service. Service: Hospitalist  Principal Problem:   Acute kidney injury superimposed on chronic kidney disease (HCC) Active Problems:   CAD in native artery   Diabetes mellitus (HCC)   Chronic respiratory failure with hypoxia (HCC)   Essential hypertension   CHF (congestive heart failure) (HCC)   Elevated troponin   Atrial flutter (HCC)   GOLD 3 COPD  Acute kidney injury on chronic kidney disease Gentle IV fluids Creatinine tomorrow morning Hold Lasix Viral gastroenteritis Antiemetics IV fluids Progress diet as tolerated Diabetes Sliding scale insulin with CBGs before meals and nightly Elevated troponin Negative delta No active chest pain Hypertension Continue  antihypertensives CHF Compensated Hold Lasix Chronic respiratory failure secondary to COPD At baseline Continue long-acting inhalers   Advance Care Planning:   Code Status: Full Code for by patient  Consults: Negative  Family Communication: None  Severity of Illness: The appropriate patient status for this patient is OBSERVATION. Observation status is judged to be reasonable and necessary in order to provide the required intensity of service to ensure the patient's safety. The patient's presenting symptoms, physical exam findings, and initial radiographic and laboratory data in the context of their medical condition is felt to place them at decreased risk for further clinical deterioration. Furthermore, it is anticipated that the patient will be medically stable for discharge from the hospital within 2 midnights of admission.   Author: Levie Heritage, DO 06/06/2023 9:07 PM  For on call review [email or web address].
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
## 2023-06-06 NOTE — ED Provider Notes (Incomplete)
[place] EMERGENCY DEPARTMENT AT [facility] Provider Note   CSN: [id number] Arrival date & time: 06/06/23  1505     History {Add pertinent medical, surgical, social history, OB history to HPI:1} Chief Complaint  Patient presents with   Chest Pain    Ronald [name] is a 77 y.o. male who presents emergency department with a chief complaint of chest pain and nausea chest pain and nausea.  He has a past medical history of chronic hypoxic respiratory failure on 2 L nasal cannula.,  History of CAD status post CABG, CHF, OSA, hypertension, hyperlipidemia, diabetes, paroxysmal A-fib, prolonged QT and chronic anticoagulation therapy with Eliquis. Patient reports that he has been nauseous severely for the past 4 days.  He had 1 episode of vomiting on the very first day.  He reports that last night he began having sharp constant chest pain on the left side.  He was unable to sleep due to the pain.  He denies any vomiting.  He has poor respiratory status at baseline but states that he feels like his breathing is good for him today.  He is noted to have bilateral pitting edema in the upper extremities.  He states he has had this for years and it is not new.  He denies cough, fever, abdominal pain, weight gain or abdominal distention.   Chest Pain      Home Medications Prior to Admission medications   Medication Sig Start Date End Date Taking? Authorizing Provider  acetaminophen (TYLENOL) 325 MG tablet Take 2 tablets (650 mg total) by mouth every 6 (six) hours as needed for mild pain (or Fever >/= 101). 12/10/21   Emokpae, Courage, MD  albuterol (VENTOLIN HFA) 108 (90 Base) MCG/ACT inhaler Inhale 2 puffs into the lungs every 6 (six) hours as needed for wheezing or shortness of breath. 05/22/23   [name], FNP  ALPRAZolam Prudy Feeler) 0.25 MG tablet Take 1 tablet (0.25 mg total) by mouth 2 (two) times daily as needed for anxiety. 05/29/23   [name], FNP  amiodarone (PACERONE)  200 MG tablet Take 1 tablet by mouth once daily 04/23/23   [name] A, FNP  apixaban (ELIQUIS) 5 MG TABS tablet Take 1 tablet (5 mg total) by mouth 2 (two) times daily. Patient not taking: Reported on 02/23/2023 12/03/22   Antoine Poche, MD  benzonatate (TESSALON) 200 MG capsule Take 1 capsule (200 mg total) by mouth 3 (three) times daily as needed. 05/22/23   [name], FNP  bisoprolol (ZEBETA) 5 MG tablet TAKE 1 TABLET BY MOUTH ONCE DAILY IN  PLACE  OF  METOPROLOL 02/24/23   Hawks, Christy A, FNP  Budeson-Glycopyrrol-Formoterol (BREZTRI AEROSPHERE) 160-9-4.8 MCG/ACT AERO Inhale 2 puffs into the lungs 2 (two) times daily. 04/14/23   [name] A, FNP  budesonide (PULMICORT) 0.5 MG/2ML nebulizer solution Take 2 mLs (0.5 mg total) by nebulization 2 (two) times daily. 12/10/21   Shon Hale, MD  buPROPion (WELLBUTRIN XL) 150 MG 24 hr tablet Take 1 tablet by mouth once daily 02/24/23   [name] A, FNP  busPIRone (BUSPAR) 7.5 MG tablet Take 1 tablet (7.5 mg total) by mouth 3 (three) times daily as needed. 05/29/23   [name], FNP  dapagliflozin propanediol (FARXIGA) 10 MG TABS tablet Take 1 tablet (10 mg total) by mouth daily before breakfast. 04/14/23   [name], FNP  doxycycline (VIBRA-TABS) 100 MG tablet Take 1 tablet (100 mg total) by mouth 2 (two)  times daily. 05/22/23   [name], FNP  furosemide (LASIX) 40 MG tablet Take 1 tablet by mouth twice daily 03/31/23   [name] A, FNP  glucose blood (ONE TOUCH ULTRA TEST) test strip USE TO CHECK GLUCOSE ONCE DAILY 02/11/18   Remus Loffler, PA-C  ipratropium-albuterol (DUONEB) 0.5-2.5 (3) MG/3ML SOLN Take 3 mLs by nebulization every 6 (six) hours as needed (asthma). 03/13/23   [name] A, FNP  isosorbide mononitrate (IMDUR) 60 MG 24 hr tablet Take 1 tablet (60 mg total) by mouth daily. 04/29/23   Antoine Poche, MD  nitroGLYCERIN (NITROSTAT) 0.4 MG SL tablet Place 1 tablet (0.4 mg total) under  the tongue every 5 (five) minutes x 3 doses as needed for chest pain (if no relief after 2nd dose, proceed to ED or call 911). 04/29/23   Antoine Poche, MD  omeprazole (PRILOSEC) 20 MG capsule Take 1 capsule (20 mg total) by mouth daily. 05/29/23   [name] A, FNP  OXYGEN Inhale 3 L into the lungs at bedtime as needed (shortness of breath).    [provider]  polyethylene glycol (MIRALAX / GLYCOLAX) 17 g packet Take 17 g by mouth daily. 12/10/21   Shon Hale, MD  potassium chloride SA (KLOR-CON M) 20 MEQ tablet Take 1 tablet (20 mEq total) by mouth 2 (two) times daily. 05/29/23   [name], FNP  rosuvastatin (CRESTOR) 20 MG tablet Take 1 tablet (20 mg total) by mouth daily. 05/29/23   [name], FNP      Allergies    Gabapentin and Metformin and related    Review of Systems   Review of Systems  Cardiovascular:  Positive for chest pain.    Physical Exam Updated Vital Signs BP (!) 144/67 (BP Location: Right Arm)   Pulse 68   Temp 98.7 F (37.1 C) (Oral)   Resp (!) 22   Ht 5\' 9"  (1.753 m)   Wt 72.6 kg   SpO2 91%   BMI 23.63 kg/m  Physical Exam Vitals and nursing note reviewed.  Constitutional:      General: He is not in acute distress.    Appearance: He is well-developed. He is not diaphoretic.  HENT:     Head: Normocephalic and atraumatic.  Eyes:     General: No scleral icterus.    Conjunctiva/sclera: Conjunctivae normal.  Cardiovascular:     Rate and Rhythm: Normal rate and regular rhythm.     Heart sounds: Normal heart sounds.  Pulmonary:     Effort: Pulmonary effort is normal. Prolonged expiration present. No respiratory distress.     Breath sounds: Wheezing present.     Comments: Pursed lipped breathing, prolonged expiratory phase, moderate tripoding. Abdominal:     General: Abdomen is protuberant. There is no distension.     Palpations: Abdomen is soft.     Tenderness: There is no abdominal tenderness.  Musculoskeletal:      Cervical back: Normal range of motion and neck supple.     Right lower leg: No edema.     Left lower leg: No edema.     Comments: Bilateral upper extremity edema left greater than right  Skin:    General: Skin is warm and dry.  Neurological:     Mental Status: He is alert.  Psychiatric:        Behavior: Behavior normal.     ED Results / Procedures / Treatments   Labs (all labs ordered are listed, but  only abnormal results are displayed) Labs Reviewed  CBC  BASIC METABOLIC PANEL  TROPONIN I (HIGH SENSITIVITY)    EKG EKG Interpretation Date/Time:  Saturday June 06 2023 15:15:21 EST Ventricular Rate:  68 PR Interval:  174 QRS Duration:  112 QT Interval:  452 QTC Calculation: 480 R Axis:   68  Text Interpretation: Normal sinus rhythm Left ventricular hypertrophy with repolarization abnormality ( Sokolow-Lyon ) Prolonged QT Abnormal ECG When compared with ECG of 29-Apr-2023 15:31, QT is improved Confirmed by Vonita Moss [phone]) on 06/06/2023 3:19:48 PM  Radiology No results found.  Procedures Procedures  {Document cardiac monitor, telemetry assessment procedure when appropriate:1}  Medications Ordered in ED Medications - No data to display  ED Course/ Medical Decision Making/ A&P   {   Click here for ABCD2, HEART and other calculatorsREFRESH Note before signing :1}                              Medical Decision Making Amount and/or Complexity of Data Reviewed Labs: ordered. Radiology: ordered.   ***  {Document critical care time when appropriate:1} {Document review of labs and clinical decision tools ie heart score, Chads2Vasc2 etc:1}  {Document your independent review of radiology images, and any outside records:1} {Document your discussion with family members, caretakers, and with consultants:1} {Document social determinants of health affecting pt's care:1} {Document your decision making why or why not admission, treatments were needed:1} Final  Clinical Impression(s) / ED Diagnoses Final diagnoses:  None    Rx / DC Orders ED Discharge Orders     None

## 2023-06-07 DIAGNOSIS — I129 Hypertensive chronic kidney disease with stage 1 through stage 4 chronic kidney disease, or unspecified chronic kidney disease: Secondary | ICD-10-CM | POA: Diagnosis not present

## 2023-06-07 DIAGNOSIS — E782 Mixed hyperlipidemia: Secondary | ICD-10-CM | POA: Diagnosis not present

## 2023-06-07 DIAGNOSIS — J9611 Chronic respiratory failure with hypoxia: Secondary | ICD-10-CM

## 2023-06-07 DIAGNOSIS — B9789 Other viral agents as the cause of diseases classified elsewhere: Secondary | ICD-10-CM | POA: Diagnosis present

## 2023-06-07 DIAGNOSIS — I2510 Atherosclerotic heart disease of native coronary artery without angina pectoris: Secondary | ICD-10-CM

## 2023-06-07 DIAGNOSIS — I4892 Unspecified atrial flutter: Secondary | ICD-10-CM | POA: Diagnosis not present

## 2023-06-07 DIAGNOSIS — N1832 Chronic kidney disease, stage 3b: Secondary | ICD-10-CM | POA: Diagnosis not present

## 2023-06-07 DIAGNOSIS — B9710 Unspecified enterovirus as the cause of diseases classified elsewhere: Secondary | ICD-10-CM | POA: Diagnosis not present

## 2023-06-07 DIAGNOSIS — Z951 Presence of aortocoronary bypass graft: Secondary | ICD-10-CM | POA: Diagnosis not present

## 2023-06-07 DIAGNOSIS — Z955 Presence of coronary angioplasty implant and graft: Secondary | ICD-10-CM | POA: Diagnosis not present

## 2023-06-07 DIAGNOSIS — J441 Chronic obstructive pulmonary disease with (acute) exacerbation: Secondary | ICD-10-CM | POA: Diagnosis not present

## 2023-06-07 DIAGNOSIS — R7989 Other specified abnormal findings of blood chemistry: Secondary | ICD-10-CM | POA: Diagnosis not present

## 2023-06-07 DIAGNOSIS — Z8249 Family history of ischemic heart disease and other diseases of the circulatory system: Secondary | ICD-10-CM | POA: Diagnosis not present

## 2023-06-07 DIAGNOSIS — R079 Chest pain, unspecified: Secondary | ICD-10-CM | POA: Diagnosis not present

## 2023-06-07 DIAGNOSIS — N179 Acute kidney failure, unspecified: Secondary | ICD-10-CM | POA: Diagnosis not present

## 2023-06-07 DIAGNOSIS — I2489 Other forms of acute ischemic heart disease: Secondary | ICD-10-CM | POA: Diagnosis not present

## 2023-06-07 DIAGNOSIS — I252 Old myocardial infarction: Secondary | ICD-10-CM | POA: Diagnosis not present

## 2023-06-07 DIAGNOSIS — Z1152 Encounter for screening for COVID-19: Secondary | ICD-10-CM | POA: Diagnosis not present

## 2023-06-07 DIAGNOSIS — F419 Anxiety disorder, unspecified: Secondary | ICD-10-CM | POA: Diagnosis not present

## 2023-06-07 DIAGNOSIS — F32A Depression, unspecified: Secondary | ICD-10-CM | POA: Diagnosis not present

## 2023-06-07 DIAGNOSIS — Y92239 Unspecified place in hospital as the place of occurrence of the external cause: Secondary | ICD-10-CM | POA: Diagnosis not present

## 2023-06-07 DIAGNOSIS — E860 Dehydration: Secondary | ICD-10-CM | POA: Diagnosis not present

## 2023-06-07 DIAGNOSIS — E1122 Type 2 diabetes mellitus with diabetic chronic kidney disease: Secondary | ICD-10-CM | POA: Diagnosis not present

## 2023-06-07 DIAGNOSIS — Z9981 Dependence on supplemental oxygen: Secondary | ICD-10-CM | POA: Diagnosis not present

## 2023-06-07 DIAGNOSIS — I480 Paroxysmal atrial fibrillation: Secondary | ICD-10-CM | POA: Diagnosis not present

## 2023-06-07 DIAGNOSIS — F17210 Nicotine dependence, cigarettes, uncomplicated: Secondary | ICD-10-CM | POA: Diagnosis not present

## 2023-06-07 DIAGNOSIS — Z7901 Long term (current) use of anticoagulants: Secondary | ICD-10-CM | POA: Diagnosis not present

## 2023-06-07 LAB — RESPIRATORY PANEL BY PCR

## 2023-06-07 LAB — BASIC METABOLIC PANEL
Anion gap: 10 (ref 5–15)
BUN: 37 mg/dL — ABNORMAL HIGH (ref 8–23)
CO2: 24 mmol/L (ref 22–32)
Calcium: 8.6 mg/dL — ABNORMAL LOW (ref 8.9–10.3)
Chloride: 106 mmol/L (ref 98–111)
Creatinine, Ser: 2.09 mg/dL — ABNORMAL HIGH (ref 0.61–1.24)
GFR, Estimated: 32 mL/min — ABNORMAL LOW (ref >60)
Glucose, Bld: 91 mg/dL (ref 70–99)
Potassium: 3.5 mmol/L (ref 3.5–5.1)
Sodium: 140 mmol/L (ref 135–145)

## 2023-06-07 LAB — GLUCOSE, CAPILLARY
Glucose-Capillary: 78 mg/dL (ref 70–99)
Glucose-Capillary: 95 mg/dL (ref 70–99)
Glucose-Capillary: 90 mg/dL (ref 70–99)
Glucose-Capillary: 153 mg/dL — ABNORMAL HIGH (ref 70–99)

## 2023-06-07 LAB — SARS CORONAVIRUS 2 BY RT PCR: SARS Coronavirus 2 by RT PCR: NEGATIVE

## 2023-06-07 MED ORDER — PANTOPRAZOLE SODIUM 40 MG PO TBEC
40.0000 mg | DELAYED_RELEASE_TABLET | Freq: Two times a day (BID) | ORAL | Status: DC
  Administered 2023-06-07 – 2023-06-09 (×4): 40 mg via ORAL
  Filled 2023-06-07 (×4): qty 1

## 2023-06-07 MED ORDER — METHYLPREDNISOLONE SODIUM SUCC 125 MG IJ SOLR
120.0000 mg | Freq: Two times a day (BID) | INTRAMUSCULAR | Status: DC
  Administered 2023-06-07 – 2023-06-08 (×3): 120 mg via INTRAVENOUS
  Filled 2023-06-07 (×3): qty 2

## 2023-06-07 MED ORDER — HYDRALAZINE HCL 20 MG/ML IJ SOLN
10.0000 mg | Freq: Four times a day (QID) | INTRAMUSCULAR | Status: DC | PRN
  Administered 2023-06-07 – 2023-06-09 (×2): 10 mg via INTRAVENOUS
  Filled 2023-06-07 (×2): qty 1

## 2023-06-07 MED ORDER — ARFORMOTEROL TARTRATE 15 MCG/2ML IN NEBU
15.0000 ug | INHALATION_SOLUTION | Freq: Two times a day (BID) | RESPIRATORY_TRACT | Status: DC
  Administered 2023-06-07 – 2023-06-09 (×4): 15 ug via RESPIRATORY_TRACT
  Filled 2023-06-07 (×4): qty 2

## 2023-06-07 MED ORDER — BUDESONIDE 0.5 MG/2ML IN SUSP
0.5000 mg | Freq: Two times a day (BID) | RESPIRATORY_TRACT | Status: DC
  Administered 2023-06-07 – 2023-06-09 (×4): 0.5 mg via RESPIRATORY_TRACT
  Filled 2023-06-07 (×4): qty 2

## 2023-06-07 MED ORDER — IPRATROPIUM-ALBUTEROL 0.5-2.5 (3) MG/3ML IN SOLN
3.0000 mL | Freq: Four times a day (QID) | RESPIRATORY_TRACT | Status: DC
  Administered 2023-06-07 – 2023-06-09 (×8): 3 mL via RESPIRATORY_TRACT
  Filled 2023-06-07 (×8): qty 3

## 2023-06-07 MED ORDER — METHYLPREDNISOLONE SODIUM SUCC 125 MG IJ SOLR: 60.0000 mg | Freq: Four times a day (QID) | INTRAMUSCULAR | Status: DC

## 2023-06-07 MED ORDER — APIXABAN 5 MG PO TABS
5.0000 mg | ORAL_TABLET | Freq: Two times a day (BID) | ORAL | Status: DC
  Administered 2023-06-07 – 2023-06-09 (×4): 5 mg via ORAL
  Filled 2023-06-07 (×4): qty 1

## 2023-06-07 NOTE — Progress Notes (Signed)
NO acute events overnight. Kellogg RN

## 2023-06-07 NOTE — Progress Notes (Signed)
PHARMACIST - PHYSICIAN COMMUNICATION   CONCERNING: Methylprednisolone IV    Current order: Methylprednisolone 60 mg IV q6H     DESCRIPTION: Per [facility] Protocol:   IV methylprednisolone will be converted to either a q12h or q24h frequency with the same total daily dose (TDD).  Ordered Dose: 1 to 125 mg TDD; convert to: TDD q24h.  Ordered Dose: 126 to 250 mg TDD; convert to: TDD div q12h.  Ordered Dose: >250 mg TDD; DAW.  Order has been adjusted to: Methylprednisolone 120 mg IV q12h   Will M. Dareen Piano, PharmD Clinical Pharmacist 06/07/2023 1:58 PM

## 2023-06-07 NOTE — Hospital Course (Signed)
77 year old male with a history of coronary artery disease/NSTEMI, paroxysmal atrial fibrillation, hypertension, diabetes mellitus type 2, hyperlipidemia, COPD, chronic respiratory failure on 2 L, tobacco abuse, CKD stage III, and CVA presenting with 3-day history of chest pain, shortness breath, nausea, vomiting.  The patient states that he has had on and off chest pain lasting about an hour.  There is no exacerbating or alleviating factors.  He states that he has been low but more short of breath than usual.  He has a nonproductive cough.  Denies any hemoptysis.  He denies any fevers, chills, headache, neck pain.  There is no hematemesis.  He denies any hematochezia or melena, but he states that he has been having some loose stools.  He continues to smoke about 2 cigarettes/day.  He has over a 40-pack-year history of tobacco.  Because of worsening generalized weakness, the patient presented for further evaluation and treatment. In the ED, the patient was afebrile hemodynamically stable.  WBC 9.8, hemoglobin 14.2, platelets 359.  Sodium 140, potassium 3.5, bicarbonate 24, serum creatinine 2.57.  The patient was started on IV fluids.  He was admitted for further evaluation and treatment of his chest pain, shortness of breath and vomiting.

## 2023-06-07 NOTE — Progress Notes (Signed)
PROGRESS NOTE  Ronald [name] ZDG:[id number] DOB: June 15, 1945 DOA: 06/06/2023 PCP: [name], FNP  Brief History:  77 year old male with a history of coronary artery disease/NSTEMI, paroxysmal atrial fibrillation, hypertension, diabetes mellitus type 2, hyperlipidemia, COPD, chronic respiratory failure on 2 L, tobacco abuse, CKD stage III, and CVA presenting with 3-day history of chest pain, shortness breath, nausea, vomiting.  The patient states that he has had on and off chest pain lasting about an hour.  There is no exacerbating or alleviating factors.  He states that he has been low but more short of breath than usual.  He has a nonproductive cough.  Denies any hemoptysis.  He denies any fevers, chills, headache, neck pain.  There is no hematemesis.  He denies any hematochezia or melena, but he states that he has been having some loose stools.  He continues to smoke about 2 cigarettes/day.  He has over a 40-pack-year history of tobacco.  Because of worsening generalized weakness, the patient presented for further evaluation and treatment. In the ED, the patient was afebrile hemodynamically stable.  WBC 9.8, hemoglobin 14.2, platelets 359.  Sodium 140, potassium 3.5, bicarbonate 24, serum creatinine 2.57.  The patient was started on IV fluids.  He was admitted for further evaluation and treatment of his chest pain, shortness of breath and vomiting.   Assessment/Plan: COPD exacerbation -Patient is wheezing on examination with increasing shortness of breath -Start DuoNebs -Start Pulmicort -Start Brovana -Start IV Solu-Medrol  Chest pain/elevated troponin -Troponin trend is flat 76>> 69 -Chest pain has resolved since hospitalization -Continue Imdur -Echocardiogram -Cardiology consult -Patient saw Dr. Wyline Mood 04/30/2023 at which time a Lexiscan was planned -Status post DES to LAD times 2--2015  Acute on chronic renal failure --CKD stage IIIb -Baseline creatinine 1.4-1.7 -due  to volume depletion -Serum creatinine peaked 2.57 -Improving with IV fluids>> continue  Vomiting and diarrhea -cdiff -covid 19 -continue pantoprazole  Chronic respiratory failure with hypoxia -Chronically on 2 L nasal cannula at home although he does not use it consistently  Paroxysmal atrial fibrillation -Currently in sinus rhythm -Continue amiodarone -Continue apixaban  Essential hypertension -Continue bisoprolol  Mixed hyperlipidemia -Continue statin  Diabetes mellitus type 2 -02/02/2023 hemoglobin A1c 6.3 -NovoLog sliding scale -Anticipate elevated CBGs secondary to steroids  Tobacco abuse -Tobacco cessation discussed  Anxiety/depression -Continue home dose Wellbutrin and alprazolam     Family Communication: no  Family at bedside  Consultants:  cardiology  Code Status:  FULL  DVT Prophylaxis: apixaban   Procedures: As Listed in Progress Note Above  Antibiotics: None       Subjective: Patient states that he is breathing better today.  He has no chest pain presently.  He denies any fevers, chills, vomiting.  He still feels little nauseous.  He denies any abdominal pain.  There is no hematochezia or melena but he has loose stool.  Objective: Vitals:   06/07/23 0534 06/07/23 0850 06/07/23 1000 06/07/23 1309  BP: (!) 140/73  (!) 162/68 (!) 145/55  Pulse: 73  65 65  Resp: 20  (!) 21 18  Temp: 98.3 F (36.8 C)  99.2 F (37.3 C) 98.6 F (37 C)  TempSrc: Oral  Oral Oral  SpO2: 97% 95% 97% 95%  Weight:      Height:        Intake/Output Summary (Last 24 hours) at 06/07/2023 1331 Last data filed at 06/07/2023 1317 Gross per 24 hour  Intake 1593.86 ml  Output 1250 ml  Net 343.86 ml   Weight change:  Exam:  General:  Pt is alert, follows commands appropriately, not in acute distress HEENT: No icterus, No thrush, No neck mass, [place]/AT Cardiovascular: RRR, S1/S2, no rubs, no gallops Respiratory: Bibasilar rales.  Bibasilar expiratory wheeze.   Diminished breath sounds bilateral. Abdomen: Soft/+BS, non tender, non distended, no guarding Extremities: No edema, No lymphangitis, No petechiae, No rashes, no synovitis   Data Reviewed: I have personally reviewed following labs and imaging studies Basic Metabolic Panel: Recent Labs  Lab 06/06/23 1552 06/06/23 1758 06/07/23 0350  NA 139 138 140  K 3.9 3.8 3.5  CL 104 104 106  CO2 24 24 24   GLUCOSE 108* 107* 91  BUN 43* 43* 37*  CREATININE 2.57* 2.56* 2.09*  CALCIUM 8.7* 8.8* 8.6*   Liver Function Tests: No results for input(s): "AST", "ALT", "ALKPHOS", "BILITOT", "PROT", "ALBUMIN" in the last 168 hours. No results for input(s): "LIPASE", "AMYLASE" in the last 168 hours. No results for input(s): "AMMONIA" in the last 168 hours. Coagulation Profile: No results for input(s): "INR", "PROTIME" in the last 168 hours. CBC: Recent Labs  Lab 06/06/23 1552  WBC 9.8  HGB 14.2  HCT 43.4  MCV 85.3  PLT 359   Cardiac Enzymes: No results for input(s): "CKTOTAL", "CKMB", "CKMBINDEX", "TROPONINI" in the last 168 hours. BNP: Invalid input(s): "POCBNP" CBG: Recent Labs  Lab 06/06/23 2143 06/07/23 0750 06/07/23 1119  GLUCAP 78 78 95   HbA1C: No results for input(s): "HGBA1C" in the last 72 hours. Urine analysis:    Component Value Date/Time   COLORURINE YELLOW 07/10/2021 0750   APPEARANCEUR CLEAR 07/10/2021 0750   APPEARANCEUR Clear 04/18/2021 1539   LABSPEC >1.030 (H) 07/10/2021 0750   PHURINE 5.5 07/10/2021 0750   GLUCOSEU 100 (A) 07/10/2021 0750   HGBUR TRACE (A) 07/10/2021 0750   BILIRUBINUR NEGATIVE 07/10/2021 0750   BILIRUBINUR Negative 04/18/2021 1539   KETONESUR NEGATIVE 07/10/2021 0750   PROTEINUR 30 (A) 07/10/2021 0750   UROBILINOGEN 0.2 06/22/2014 1918   NITRITE NEGATIVE 07/10/2021 0750   LEUKOCYTESUR NEGATIVE 07/10/2021 0750   Sepsis Labs: @LABRCNTIP (procalcitonin:4,lacticidven:4) )No results found for this or any previous visit (from the past 240  hours).   Scheduled Meds:  amiodarone  200 mg Oral Daily   bisoprolol  5 mg Oral Daily   buPROPion  150 mg Oral Daily   enoxaparin (LOVENOX) injection  30 mg Subcutaneous Q24H   insulin aspart  0-15 Units Subcutaneous TID WC   insulin aspart  0-5 Units Subcutaneous QHS   isosorbide mononitrate  60 mg Oral Daily   mometasone-formoterol  2 puff Inhalation BID   pantoprazole  40 mg Oral Daily   rosuvastatin  20 mg Oral Daily   umeclidinium bromide  1 puff Inhalation Daily   Continuous Infusions:  lactated ringers 75 mL/hr at 06/07/23 1137    Procedures/Studies: DG Chest 2 View Result Date: 06/06/2023 CLINICAL DATA:  Acute left-sided chest pain beginning 3 hours ago. Shortness of breath. EXAM: CHEST - 2 VIEW COMPARISON:  05/14/2022 FINDINGS: Stable mild cardiomegaly. Stable changes of COPD. Both lungs are clear. The visualized skeletal structures are unremarkable. IMPRESSION: No active cardiopulmonary disease. COPD. Electronically Signed   By: Danae Orleans M.D.   On: 06/06/2023 17:09    Catarina Hartshorn, DO  [facility]  If 7PM-7AM, please contact night-coverage [email or web address] Password [email or web address] 06/07/2023, 1:31 PM   LOS: 0 days

## 2023-06-07 NOTE — Progress Notes (Signed)
°   06/07/23 0111  Vitals  Temp 98.9 F (37.2 C)  Temp Source Oral  BP (!) 190/79  MAP (mmHg) 112  BP Location Right Arm  BP Method Automatic  Patient Position (if appropriate) Lying  Pulse Rate 68  Pulse Rate Source Dinamap  ECG Heart Rate 70  Resp 20  Level of Consciousness  Level of Consciousness Alert  MEWS COLOR  MEWS Score Color Green  Oxygen Therapy  SpO2 100 %  O2 Device Nasal Cannula  O2 Flow Rate (L/min) 2 L/min  Pain Assessment  Pain Scale 0-10  Pain Score 0  MEWS Score  MEWS Temp 0  MEWS Systolic 0  MEWS Pulse 0  MEWS RR 0  MEWS LOC 0  MEWS Score 0   This RN informed Dr Thomes Dinning of hypertension. MEWS has not changes. Awaiting further orders. Kell[place] RN

## 2023-06-07 NOTE — Care Management Obs Status (Signed)
MEDICARE OBSERVATION STATUS NOTIFICATION   Patient Details  Name: Ronald [name] MRN: [id number] Date of Birth: [birth year]   Medicare Observation Status Notification Given:  Yes    Larrie Kass, LCSW 06/07/2023, 3:10 PM

## 2023-06-08 ENCOUNTER — Other Ambulatory Visit: Payer: Self-pay | Admitting: *Deleted

## 2023-06-08 DIAGNOSIS — N179 Acute kidney failure, unspecified: Secondary | ICD-10-CM | POA: Diagnosis not present

## 2023-06-08 DIAGNOSIS — R7989 Other specified abnormal findings of blood chemistry: Secondary | ICD-10-CM

## 2023-06-08 DIAGNOSIS — J441 Chronic obstructive pulmonary disease with (acute) exacerbation: Secondary | ICD-10-CM | POA: Diagnosis not present

## 2023-06-08 DIAGNOSIS — J9611 Chronic respiratory failure with hypoxia: Secondary | ICD-10-CM | POA: Diagnosis not present

## 2023-06-08 DIAGNOSIS — N1832 Chronic kidney disease, stage 3b: Secondary | ICD-10-CM | POA: Diagnosis not present

## 2023-06-08 DIAGNOSIS — R079 Chest pain, unspecified: Secondary | ICD-10-CM

## 2023-06-08 LAB — BASIC METABOLIC PANEL
Anion gap: 7 (ref 5–15)
BUN: 35 mg/dL — ABNORMAL HIGH (ref 8–23)
CO2: 25 mmol/L (ref 22–32)
Calcium: 9 mg/dL (ref 8.9–10.3)
Chloride: 105 mmol/L (ref 98–111)
Creatinine, Ser: 1.81 mg/dL — ABNORMAL HIGH (ref 0.61–1.24)
GFR, Estimated: 39 mL/min — ABNORMAL LOW (ref >60)
Glucose, Bld: 143 mg/dL — ABNORMAL HIGH (ref 70–99)
Potassium: 4 mmol/L (ref 3.5–5.1)
Sodium: 137 mmol/L (ref 135–145)

## 2023-06-08 LAB — GLUCOSE, CAPILLARY
Glucose-Capillary: 135 mg/dL — ABNORMAL HIGH (ref 70–99)
Glucose-Capillary: 157 mg/dL — ABNORMAL HIGH (ref 70–99)
Glucose-Capillary: 96 mg/dL (ref 70–99)
Glucose-Capillary: 171 mg/dL — ABNORMAL HIGH (ref 70–99)

## 2023-06-08 LAB — HEMOGLOBIN A1C
Hgb A1c MFr Bld: 6.6 % — ABNORMAL HIGH (ref 4.8–5.6)
Mean Plasma Glucose: 143 mg/dL

## 2023-06-08 LAB — MAGNESIUM: Magnesium: 2.2 mg/dL (ref 1.7–2.4)

## 2023-06-08 MED ORDER — LACTATED RINGERS IV SOLN: INTRAVENOUS | Status: AC

## 2023-06-08 MED ORDER — METHYLPREDNISOLONE SODIUM SUCC 40 MG IJ SOLR
40.0000 mg | Freq: Two times a day (BID) | INTRAMUSCULAR | Status: DC
  Administered 2023-06-09: 40 mg via INTRAVENOUS
  Filled 2023-06-08: qty 1

## 2023-06-08 NOTE — TOC Initial Note (Signed)
Transition of Care [facility]) - Initial/Assessment Note    Patient Details  Name: Ronald [name] MRN: [id number] Date of Birth: Sep 22, 1945  Transition of Care [facility] CM/SW Contact:    Karn Cassis, LCSW Phone Number: 06/08/2023, 2:55 PM  Clinical Narrative: Pt admitted due to acute kidney injury on chronic kidney disease. Pt reports he lives with his ex-wife and is fairly independent with ADLs at baseline. He is on 2L home [facility]). PT evaluated pt and recommend HHPT. Discussed with pt who is agreeable with no preference on agency. Referred and accepted by Velna Hatchet with [facility]. HHPT order in. TOC will follow.                   Expected Discharge Plan: Home w Home Health Services Barriers to Discharge: Continued Medical Work up   Patient Goals and CMS Choice Patient states their goals for this hospitalization and ongoing recovery are:: return home   Choice offered to / list presented to : Patient [place] ownership interest in [facility]provided to::  (n/a)    Expected Discharge Plan and Services In-house Referral: Clinical Social Work   Post Acute Care Choice: Home Health Living arrangements for the past 2 months: Single Family Home                           HH Arranged: PT HH Agency: [facility] Date [facility] Agency Contacted: 06/08/23 Time HH Agency Contacted: 1455 Representative spoke with at [facility] Agency: Velna Hatchet  Prior Living Arrangements/Services Living arrangements for the past 2 months: Single Family Home Lives with:: Other (Comment) (ex-wife) Patient language and need for interpreter reviewed:: Yes Do you feel safe going back to the place where you live?: Yes      Need for Family Participation in Patient Care: No (Comment)     Criminal Activity/Legal Involvement Pertinent to Current Situation/Hospitalization: No - Comment as needed  Activities of Daily Living      Permission Sought/Granted                   Emotional Assessment     Affect (typically observed): Appropriate Orientation: : Oriented to Self, Oriented to Place, Oriented to  Time, Oriented to Situation Alcohol / Substance Use: Not Applicable Psych Involvement: No (comment)  Admission diagnosis:  AKI (acute kidney injury) (HCC) [N17.9] Acute kidney injury superimposed on chronic kidney disease (HCC) [N17.9, N18.9] COPD exacerbation (HCC) [J44.1] Patient Active Problem List   Diagnosis Date Noted   Acute renal failure superimposed on stage 3b chronic kidney disease (HCC) 06/07/2023   COPD exacerbation (HCC) 06/07/2023   Acute kidney injury superimposed on chronic kidney disease (HCC) 06/06/2023   GOLD 3 COPD 07/07/2022   COPD with acute exacerbation (HCC) 12/07/2021   Prolonged QT interval 11/23/2021   Acute exacerbation of CHF (congestive heart failure) (HCC) 11/23/2021   Acute on chronic diastolic CHF (congestive heart failure) (HCC) 11/22/2021   Non-ST elevation (NSTEMI) myocardial infarction (HCC) 09/01/2021   Acute respiratory failure (HCC) 07/10/2021   Pulmonary nodule 07/10/2021   Malnutrition of moderate degree 05/13/2021   Atrial flutter (HCC) 05/01/2021   Shock circulatory (HCC) 05/01/2021   Elevated troponin 12/21/2020   Hypokalemia 12/21/2020   Mixed hyperlipidemia 12/21/2020   Acute on chronic respiratory failure with hypoxia (HCC) 12/21/2020   Chest pain 05/23/2020   Controlled substance agreement signed 04/03/2020   Benzodiazepine dependence (HCC) 04/03/2020   Dysphagia 02/27/2020   Tracheal  stenosis 12/09/2019   Lymphedema 04/01/2019   CHF (congestive heart failure) (HCC) 08/27/2018   Aortic atherosclerosis (HCC) 05/11/2017   Depression 05/05/2017   GERD (gastroesophageal reflux disease) 01/16/2017   Diabetic neuropathy (HCC) 01/16/2017   Anterolisthesis 12/04/2016   History of MI (myocardial infarction) 12/04/2016   OSA (obstructive sleep apnea) 12/04/2016   Closed fracture dislocation of lumbar  spine (HCC) 07/25/2016   Closed fracture of body of sternum 07/25/2016   Multiple fractures of cervical spine (HCC) 07/25/2016   Anxiety 02/14/2016   Chronic respiratory failure with hypoxia (HCC) 07/19/2014   Essential hypertension 07/19/2014   Cigarette smoker 07/19/2014   CAD in native artery 06/22/2014   Diabetes mellitus (HCC) 06/22/2014   Tobacco abuse 06/22/2014   Acute encephalopathy 06/22/2014   COPD (chronic obstructive pulmonary disease) with emphysema (HCC) 06/22/2014   PCP:  [name], FNP Pharmacy:   [facility] [address] [place] - [address] [place] [address] Phone: [phone] Fax: [phone]  [facility] - [place], [place] - [address] [place] [address] Phone: [phone] Fax: [phone]     Social Drivers of Health (SDOH) Social History: SDOH Screenings   Food Insecurity: No Food Insecurity (06/06/2023)  Housing: Unknown (06/06/2023)  Transportation Needs: No Transportation Needs (06/06/2023)  Utilities: Not At Risk (06/06/2023)  Alcohol Screen: Low Risk  (02/04/2023)  Depression (PHQ2-9): Low Risk  (02/04/2023)  Financial Resource Strain: Low Risk  (02/04/2023)  Physical Activity: Inactive (02/04/2023)  Social Connections: Socially Isolated (02/04/2023)  Stress: No Stress Concern Present (02/04/2023)  Tobacco Use: High Risk (06/06/2023)  Health Literacy: Adequate Health Literacy (02/04/2023)   SDOH Interventions:     Readmission Risk Interventions    06/08/2023    2:52 PM 12/09/2021    8:42 AM 11/25/2021    1:34 PM  Readmission Risk Prevention Plan  Transportation Screening Complete Complete Complete  HRI or Home Care Consult Complete    Social Work Consult for Recovery Care Planning/Counseling Complete    Palliative Care Screening Not Applicable    Medication Review Oceanographer) Complete Complete Complete  PCP or Specialist appointment within 3-5 days of discharge   Not Complete   HRI or Home Care Consult  Complete Complete  SW Recovery Care/Counseling Consult  Complete Complete  Palliative Care Screening  Not Applicable Not Applicable  Skilled Nursing Facility  Not Applicable Not Applicable

## 2023-06-08 NOTE — Plan of Care (Signed)
No acute occurrences during nursing shift. Pt did not had a bowel movement for a stool sample   Problem: Health Behavior/Discharge Planning: Goal: Ability to manage health-related needs will improve Outcome: Progressing   Problem: Clinical Measurements: Goal: Ability to maintain clinical measurements within normal limits will improve Outcome: Progressing Goal: Will remain free from infection Outcome: Progressing Goal: Diagnostic test results will improve Outcome: Progressing Goal: Respiratory complications will improve Outcome: Progressing   Problem: Activity: Goal: Risk for activity intolerance will decrease Outcome: Progressing   Problem: Nutrition: Goal: Adequate nutrition will be maintained Outcome: Progressing   Problem: Elimination: Goal: Will not experience complications related to urinary retention Outcome: Progressing   Problem: Pain Management: Goal: General experience of comfort will improve Outcome: Progressing   Problem: Safety: Goal: Ability to remain free from injury will improve Outcome: Progressing   Problem: Skin Integrity: Goal: Risk for impaired skin integrity will decrease Outcome: Progressing   Problem: Fluid Volume: Goal: Ability to maintain a balanced intake and output will improve Outcome: Progressing   Problem: Health Behavior/Discharge Planning: Goal: Ability to manage health-related needs will improve Outcome: Progressing   Problem: Metabolic: Goal: Ability to maintain appropriate glucose levels will improve Outcome: Progressing

## 2023-06-08 NOTE — Plan of Care (Signed)
  Problem: Acute Rehab PT Goals(only PT should resolve) Goal: Pt Will Go Supine/Side To Sit Outcome: Progressing Flowsheets (Taken 06/08/2023 1448) Pt will go Supine/Side to Sit: with modified independence Goal: Patient Will Transfer Sit To/From Stand Outcome: Progressing Flowsheets (Taken 06/08/2023 1448) Patient will transfer sit to/from stand:  with modified independence  with supervision Goal: Pt Will Transfer Bed To Chair/Chair To Bed Outcome: Progressing Flowsheets (Taken 06/08/2023 1448) Pt will Transfer Bed to Chair/Chair to Bed:  with modified independence  with supervision Goal: Pt Will Ambulate Outcome: Progressing Flowsheets (Taken 06/08/2023 1448) Pt will Ambulate:  50 feet  with supervision  with rolling walker   2:49 PM, 06/08/23 Ocie Bob, MPT Physical Therapist with [facility] [phone] office [phone] mobile phone

## 2023-06-08 NOTE — Consult Note (Signed)
Cardiology Consultation   Patient ID: Ronald [name] MRN: [id number]; DOB: 30-Aug-1945  Admit date: 06/06/2023 Date of Consult: 06/08/2023  PCP:  Ronald Spencer, FNP   [facility] Providers Cardiologist:  None        Patient Profile:   Ronald [name] is a 77 y.o. male with a hx of coronary artery disease status post DES to LAD x 2 status post NSTEMI (2015), COPD, hypertension, history of CVA/TIA, type 2 diabetes, tobacco abuse, ulcerative colitis, GERD, paroxysmal atrial fibrillation on chronic anticoagulation with apixaban, who is being seen 06/08/2023 for the evaluation of elevated high-sensitivity troponin at the request of Dr Tat..  History of Present Illness:   Ronald [name] had an NSTEMI in 34 of 2015 status post DES x 2 to the LAD at [facility].  Repeat heart catheterization in 05/2020 revealed normal left main, LAD patent stents with 55% distal disease, left circumflex mid LS, RCA proximal 60% and mid 50%, RV branch 80%.  Echocardiogram completed in 08/2021 revealed an LVEF of 55 to 60%, G1 DD, and multiple WMA's.  He was last seen in clinic 04/29/2023 with recent chest pains for about 3 weeks.'s can occur with rest or with activity.  He describes it as a sharp pain left-sided 5 out of 10 in severity with some shortness of breath but no other associated symptoms.  He stated the pain would last 3 to 4 minutes he takes nitroglycerin and it helps sometimes can be worse with deep breathing.  He was scheduled for a [employer].  He presented to the [facility] emergency department on 06/06/2023 complaining of chest pain started at approximately 3 hours prior to his arrival.  He stated to triage that everything hurt.  He did endorse some shortness of breath.  He did not take anything for his chest pain at home. Patient reports he been very nauseous for the past 4 days.  He had 1 episode of vomiting on the very first day.  Patient stated he was intolerant of oral food and liquids.  He  reported that the evening prior to his arrival he began having sharp constant chest pain on the left side.  He was unable to sleep due to the pain.  Feels like his breathing is a good as he has poor respiratory status at baseline with chronic oxygen therapy of 2 L O2 via nasal cannula.  He was having bilateral pitting edema to his upper extremities.  He states he did have that for years and it was not new.  He denies cough, fever, abdominal pain, weight gain, abdominal distention or early satiety.  Initial vital signs: Blood pressure 144/67, pulse of 68, respirations of 22, temperature of 98.7  Pertinent labs: BUN of 37, serum creatinine 2.09, calcium 8.6, GFR 32, COVID, RSV, influenza A and B- by respiratory PCR, respiratory panel positive for rhinovirus/enterovirus, high-sensitivity troponin 76 and 69  Imaging: Chest x-ray revealed no active cardiopulmonary disease and COPD  Medications administered in the emergency department: Albuterol, Valium, doxycycline, enoxaparin, fentanyl, and lactated Ringer's 500 mL bolus and then started on lactated Ringer's at 75 mL/h  Cardiology was consulted for concerns of chest pain with mildly elevated high-sensitivity troponin  Past Medical History:  Diagnosis Date   Anxiety    Asthma    Atrial flutter (HCC) 04/2021   Chronic diastolic CHF (congestive heart failure) (HCC)    Chronic lower back pain    COPD (chronic obstructive pulmonary disease) (HCC)    Coronary  artery disease    a. NSTEMI 05/2014 s/p DESx2 to LAD at [facility].   Depression    GERD (gastroesophageal reflux disease)    High cholesterol    History of tracheostomy    Hypertension    NSTEMI (non-ST elevated myocardial infarction) (HCC) 05/2014   with stent placement   PAF (paroxysmal atrial fibrillation) (HCC)    Sleep apnea    Stroke (HCC) 2017   anyeusym    TIA (transient ischemic attack)    "they say I've had some mini strokes; don't know when"; denies residual on 06/22/2014)    Tobacco abuse    Type II diabetes mellitus (HCC)    Ulcerative colitis (HCC)     Past Surgical History:  Procedure Laterality Date   APPENDECTOMY     BIOPSY  07/20/2020   Procedure: BIOPSY;  Surgeon: Dolores Frame, MD;  [facility];  Service: Gastroenterology;;   CARDIAC CATHETERIZATION  [phone] X 3   CHOLECYSTECTOMY     COLONOSCOPY WITH PROPOFOL N/A 07/20/2020   Procedure: COLONOSCOPY WITH PROPOFOL;  Surgeon: Dolores Frame, MD;  Location: [facility];  Service: Gastroenterology;  Laterality: N/A;  1:15   CORONARY ANGIOPLASTY WITH STENT PLACEMENT  05/2014   "2"   ESOPHAGEAL DILATION N/A 07/20/2020   Procedure: ESOPHAGEAL DILATION;  Surgeon: Dolores Frame, MD;  Location: [facility];  Service: Gastroenterology;  Laterality: N/A;   ESOPHAGOGASTRODUODENOSCOPY (EGD) WITH PROPOFOL N/A 07/20/2020   Procedure: ESOPHAGOGASTRODUODENOSCOPY (EGD) WITH PROPOFOL;  Surgeon: Dolores Frame, MD;  [facility];  Service: Gastroenterology;  Laterality: N/A;   IR GASTROSTOMY TUBE MOD SED  06/04/2021   IR GASTROSTOMY TUBE REMOVAL  07/24/2021   LEFT HEART CATH AND CORONARY ANGIOGRAPHY N/A 05/25/2020   Procedure: LEFT HEART CATH AND CORONARY ANGIOGRAPHY;  Surgeon: [place], Peter M, MD;  Location: [facility] INVASIVE CV LAB;  Service: Cardiovascular;  Laterality: N/A;   POLYPECTOMY  07/20/2020   Procedure: POLYPECTOMY INTESTINAL;  Surgeon: Dolores Frame, MD;  Location: [facility];  Service: Gastroenterology;;   TUMOR EXCISION Right ~ 1999   "side of my upper head"     Home Medications:  Prior to Admission medications   Medication Sig Start Date End Date Taking? Authorizing Provider  acetaminophen (TYLENOL) 325 MG tablet Take 2 tablets (650 mg total) by mouth every 6 (six) hours as needed for mild pain (or Fever >/= 101). 12/10/21  Yes Emokpae, Courage, MD  albuterol (VENTOLIN HFA) 108 (90 Base) MCG/ACT inhaler Inhale 2 puffs into the  lungs every 6 (six) hours as needed for wheezing or shortness of breath. 05/22/23  Yes Hawks, Christy A, FNP  ALPRAZolam (XANAX) 0.25 MG tablet Take 1 tablet (0.25 mg total) by mouth 2 (two) times daily as needed for anxiety. 05/29/23  Yes Hawks, Christy A, FNP  amiodarone (PACERONE) 200 MG tablet Take 1 tablet by mouth once daily Patient taking differently: Take 200 mg by mouth daily. 04/23/23  Yes Hawks, Christy A, FNP  benzonatate (TESSALON) 200 MG capsule Take 1 capsule (200 mg total) by mouth 3 (three) times daily as needed. 05/22/23  Yes Hawks, Christy A, FNP  bisoprolol (ZEBETA) 5 MG tablet TAKE 1 TABLET BY MOUTH ONCE DAILY IN  PLACE  OF  METOPROLOL Patient taking differently: Take 5 mg by mouth daily. 02/24/23  Yes Hawks, Christy A, FNP  Budeson-Glycopyrrol-Formoterol (BREZTRI AEROSPHERE) 160-9-4.8 MCG/ACT AERO Inhale 2 puffs into the lungs 2 (two) times daily. 04/14/23  Yes Hawks, [place] A,  FNP  buPROPion (WELLBUTRIN XL) 150 MG 24 hr tablet Take 1 tablet by mouth once daily 02/24/23  Yes Hawks, Christy A, FNP  busPIRone (BUSPAR) 7.5 MG tablet Take 1 tablet (7.5 mg total) by mouth 3 (three) times daily as needed. 05/29/23  Yes Hawks, Neysa Bonito A, FNP  dapagliflozin propanediol (FARXIGA) 10 MG TABS tablet Take 1 tablet (10 mg total) by mouth daily before breakfast. 04/14/23  Yes Hawks, Christy A, FNP  doxycycline (VIBRA-TABS) 100 MG tablet Take 1 tablet (100 mg total) by mouth 2 (two) times daily. 05/22/23  Yes Hawks, Neysa Bonito A, FNP  furosemide (LASIX) 40 MG tablet Take 1 tablet by mouth twice daily 03/31/23  Yes Hawks, Christy A, FNP  ipratropium-albuterol (DUONEB) 0.5-2.5 (3) MG/3ML SOLN Take 3 mLs by nebulization every 6 (six) hours as needed (asthma). 03/13/23  Yes Hawks, Christy A, FNP  isosorbide mononitrate (IMDUR) 60 MG 24 hr tablet Take 1 tablet (60 mg total) by mouth daily. 04/29/23  Yes Branch, Dorothe Pea, MD  nitroGLYCERIN (NITROSTAT) 0.4 MG SL tablet Place 1 tablet (0.4 mg total) under  the tongue every 5 (five) minutes x 3 doses as needed for chest pain (if no relief after 2nd dose, proceed to ED or call 911). 04/29/23  Yes BranchDorothe Pea, MD  omeprazole (PRILOSEC) 20 MG capsule Take 1 capsule (20 mg total) by mouth daily. 05/29/23  Yes Hawks, Christy A, FNP  OXYGEN Inhale 4 L into the lungs at bedtime as needed (shortness of breath).   Yes [provider]  potassium chloride SA (KLOR-CON M) 20 MEQ tablet Take 1 tablet (20 mEq total) by mouth 2 (two) times daily. 05/29/23  Yes Hawks, Christy A, FNP  rosuvastatin (CRESTOR) 20 MG tablet Take 1 tablet (20 mg total) by mouth daily. 05/29/23  Yes Hawks, Christy A, FNP  budesonide (PULMICORT) 0.5 MG/2ML nebulizer solution Take 2 mLs (0.5 mg total) by nebulization 2 (two) times daily. 12/10/21   Shon Hale, MD  polyethylene glycol (MIRALAX / GLYCOLAX) 17 g packet Take 17 g by mouth daily. Patient taking differently: Take 17 g by mouth daily as needed for mild constipation or moderate constipation. 12/10/21   Shon Hale, MD    Inpatient Medications: Scheduled Meds:  amiodarone  200 mg Oral Daily   apixaban  5 mg Oral BID   arformoterol  15 mcg Nebulization BID   bisoprolol  5 mg Oral Daily   budesonide (PULMICORT) nebulizer solution  0.5 mg Nebulization BID   buPROPion  150 mg Oral Daily   insulin aspart  0-15 Units Subcutaneous TID WC   insulin aspart  0-5 Units Subcutaneous QHS   ipratropium-albuterol  3 mL Nebulization Q6H   isosorbide mononitrate  60 mg Oral Daily   methylPREDNISolone (SOLU-MEDROL) injection  120 mg Intravenous Q12H   pantoprazole  40 mg Oral BID   rosuvastatin  20 mg Oral Daily   Continuous Infusions:  lactated ringers 80 mL/hr at 06/08/23 0405   PRN Meds: albuterol, ALPRAZolam, hydrALAZINE, prochlorperazine  Allergies:    Allergies  Allergen Reactions   Gabapentin Anxiety    Unknown reaction   Metformin And Related Rash    Social History:   Social History    Socioeconomic History   Marital status: Significant Other    Spouse name: [name]   Number of children: 1   Years of education: Not on file   Highest education level: Not on file  Occupational History   Occupation: Retired    Comment: Chief Technology Officer  Tobacco Use   Smoking status: Every Day    Current packs/day: 0.00    Average packs/day: 0.5 packs/day for 55.4 years (27.7 ttl pk-yrs)    Types: Cigarettes    Start date: 02/12/1966    Last attempt to quit: 07/03/2021    Years since quitting: 1.9    Passive exposure: Never   Smokeless tobacco: Never   Tobacco comments:    smokes 1 ppd  05/20/22  Vaping Use   Vaping status: Never Used  Substance and Sexual Activity   Alcohol use: Not Currently    Alcohol/week: 0.0 standard drinks of alcohol   Drug use: No   Sexual activity: Never  Other Topics Concern   Not on file  Social History Narrative   Staying with his ex-wife, [name]   Has 2 children (today he told me one child 01/15/21)   Social Drivers of Health   Financial Resource Strain: Low Risk  (02/04/2023)   Overall Financial Resource Strain (CARDIA)    Difficulty of Paying Living Expenses: Not hard at all  Food Insecurity: No Food Insecurity (06/06/2023)   Hunger Vital Sign    Worried About Running Out of Food in the Last Year: Never true    Ran Out of Food in the Last Year: Never true  Transportation Needs: No Transportation Needs (06/06/2023)   PRAPARE - Administrator, Civil Service (Medical): No    Lack of Transportation (Non-Medical): No  Physical Activity: Inactive (02/04/2023)   Exercise Vital Sign    Days of Exercise per Week: 0 days    Minutes of Exercise per Session: 0 min  Stress: No Stress Concern Present (02/04/2023)   [employer] of Occupational Health - Occupational Stress Questionnaire    Feeling of Stress : Not at all  Social Connections: Socially Isolated (02/04/2023)   Social Connection and Isolation Panel [NHANES]    Frequency of  Communication with Friends and Family: More than three times a week    Frequency of Social Gatherings with Friends and Family: More than three times a week    Attends Religious Services: Never    Database administrator or Organizations: No    Attends Banker Meetings: Never    Marital Status: Divorced  Catering manager Violence: Not At Risk (06/06/2023)   Humiliation, Afraid, Rape, and Kick questionnaire    Fear of Current or Ex-Partner: No    Emotionally Abused: No    Physically Abused: No    Sexually Abused: No    Family History:    Family History  Problem Relation Age of Onset   CAD Father    Lung cancer Brother        smoked   Cancer Brother        lung   Leukemia Sister    Dementia Sister    Stroke Mother    Emphysema Sister    Cancer Brother        lung     ROS:  Please see the history of present illness.  Review of Systems  Constitutional:  Positive for malaise/fatigue.  Respiratory:  Positive for cough and shortness of breath.   Cardiovascular:  Positive for chest pain and leg swelling.  Gastrointestinal:  Positive for abdominal pain, diarrhea, nausea and vomiting.  Neurological:  Positive for weakness.    All other ROS reviewed and negative.     Physical Exam/Data:   Vitals:   06/07/23 1931 06/07/23 2043 06/08/23 0401 06/08/23 1003  BP:  Marland Kitchen)  128/46 (!) 153/56 (!) 174/63  Pulse:  63 (!) 58 70  Resp:  17 17   Temp:  98.6 F (37 C) 98.7 F (37.1 C) 97.8 F (36.6 C)  TempSrc:  Oral Oral Oral  SpO2: 94% 97% 96% 98%  Weight:      Height:        Intake/Output Summary (Last 24 hours) at 06/08/2023 1019 Last data filed at 06/08/2023 0908 Gross per 24 hour  Intake 480 ml  Output 950 ml  Net -470 ml      06/06/2023    3:11 PM 04/29/2023    3:24 PM 02/04/2023   11:35 AM  Last 3 Weights  Weight (lbs) 160 lb 169 lb 9.6 oz 164 lb  Weight (kg) 72.576 kg 76.93 kg 74.39 kg     Body mass index is 23.63 kg/m.  General:  Well nourished,  well developed, in no acute distress, chronically ill-appearing HEENT: normal Neck: no JVD Vascular: No carotid bruits; Distal pulses 2+ bilaterally Cardiac:  normal S1, S2; RRR; no murmur  Lungs:  Diminished with forced expiratory wheezing noted throughout to auscultation bilaterally, respirations are unlabored at rest on 2 L of O2 via nasal cannula Abd: soft, nontender, obese, no hepatomegaly  Ext: no edema Musculoskeletal:  No deformities, BUE and BLE strength normal and equal Skin: warm and dry  Neuro:  CNs 2-12 intact, no focal abnormalities noted Psych:  Normal affect   EKG:  The EKG was personally reviewed and demonstrates: Sinus rhythm with rate of 68, LVH with early repolarization ST depression noted in inferior leads Telemetry:  Telemetry was personally reviewed and demonstrates: Sinus rinses 70-80 episode of nonsustained V. tach lasting for 10 beats overnight on telemetry  Relevant CV Studies: 2D echo 09/01/2021 1. Left ventricular ejection fraction, by estimation, is 55 to 60%. The  left ventricle has normal function. The left ventricle demonstrates  regional wall motion abnormalities (see scoring diagram/findings for  description). There is mild left ventricular   hypertrophy. Left ventricular diastolic parameters are consistent with  Grade I diastolic dysfunction (impaired relaxation). Elevated left  ventricular end-diastolic pressure. The E/e' is 20. There is moderate  hypokinesis of the left ventricular, basal  inferoseptal wall, inferolateral wall and septal wall.   2. Right ventricular systolic function is hyperdynamic. The right  ventricular size is normal.   3. Left atrial size was mildly dilated.   4. The mitral valve is abnormal. Trivial mitral valve regurgitation.   5. The aortic valve is tricuspid. Aortic valve regurgitation is not  visualized.   6. The inferior vena cava is normal in size with greater than 50%  respiratory variability, suggesting right atrial  pressure of 3 mmHg.   7. Cannot exclude a small PFO.   2D echo 05/07/21 1. Left ventricular ejection fraction, by estimation, is 50 to 55%. The  left ventricle has low normal function. The left ventricle has no regional  wall motion abnormalities. Left ventricular diastolic function could not  be evaluated.   2. Right ventricular systolic function is normal. The right ventricular  size is normal. Tricuspid regurgitation signal is inadequate for assessing  PA pressure.   3. The mitral valve is grossly normal. Trivial mitral valve  regurgitation. No evidence of mitral stenosis.   4. The aortic valve is tricuspid. Aortic valve regurgitation is not  visualized. No aortic stenosis is present.   LHC 05/25/20 Previously placed Mid LAD stent (unknown type) is widely patent. Mid LAD to Dist LAD lesion  is 50% stenosed. Dist LAD lesion is 50% stenosed. Prox RCA lesion is 60% stenosed. Mid RCA lesion is 50% stenosed. RV Branch lesion is 80% stenosed. LV end diastolic pressure is mildly elevated.   1. Modest 2 vessel CAD. The stent in the LAD is widely patent.     50% stenosis in the mid and distal LAD    60% proximal and 50% mid RCA. 80% diffuse disease in the RV marginal branch 2. LVEDP 16 mm Hg   Plan: would recommend medical therapy. No culprit lesion for his chest pain identified.     Laboratory Data:  High Sensitivity Troponin:   Recent Labs  Lab 06/06/23 1552 06/06/23 1758  TROPONINIHS 76* 69*     Chemistry Recent Labs  Lab 06/06/23 1758 06/07/23 0350 06/08/23 0432  NA 138 140 137  K 3.8 3.5 4.0  CL 104 106 105  CO2 24 24 25   GLUCOSE 107* 91 143*  BUN 43* 37* 35*  CREATININE 2.56* 2.09* 1.81*  CALCIUM 8.8* 8.6* 9.0  MG  --   --  2.2  GFRNONAA 25* 32* 39*  ANIONGAP 10 10 7     No results for input(s): "PROT", "ALBUMIN", "AST", "ALT", "ALKPHOS", "BILITOT" in the last 168 hours. Lipids No results for input(s): "CHOL", "TRIG", "HDL", "LABVLDL", "LDLCALC", "CHOLHDL"  in the last 168 hours.  Hematology Recent Labs  Lab 06/06/23 1552  WBC 9.8  RBC 5.09  HGB 14.2  HCT 43.4  MCV 85.3  MCH 27.9  MCHC 32.7  RDW 15.0  PLT 359   Thyroid No results for input(s): "TSH", "FREET4" in the last 168 hours.  BNPNo results for input(s): "BNP", "PROBNP" in the last 168 hours.  DDimer No results for input(s): "DDIMER" in the last 168 hours.   Radiology/Studies:  DG Chest 2 View Result Date: 06/06/2023 CLINICAL DATA:  Acute left-sided chest pain beginning 3 hours ago. Shortness of breath. EXAM: CHEST - 2 VIEW COMPARISON:  05/14/2022 FINDINGS: Stable mild cardiomegaly. Stable changes of COPD. Both lungs are clear. The visualized skeletal structures are unremarkable. IMPRESSION: No active cardiopulmonary disease. COPD. Electronically Signed   By: Danae Orleans M.D.   On: 06/06/2023 17:09     Assessment and Plan:   Chest pain/mildly elevated high-sensitivity troponins/coronary artery disease -Previous NSTEMI in 2015 and repeat heart catheterization completed in 2021 -Presented with chest discomfort -Chest pain resolved since hospitalized -High-sensitivity troponins trended flat at 76 and 69 likely demand ischemia from COPD exacerbation, rhinovirus/enterovirus, dehydration, acute on chronic CKD -Continued on Imdur -EKG without any ischemic changes -Continue on beta-blocker therapy and rosuvastatin -Continued on apixaban and lieu of aspirin -Previous complaints of chest discomfort with Lexiscan Myoview scheduled 06/15/2022, keep currently scheduled testing to allow for patient to recover from acute illness -Continue with telemetry monitoring -No further ischemic testing planned at this time -EKG as needed for pain or changes  Chronic respiratory failure with hypoxia/COPD exacerbation -Maintaining oxygen saturations on 2 L of O2 via nasal cannula which is baseline chronic O2 requirement -Presented with shortness of breath and wheezing on examination -Continued  on nebulizers and IV steroids -Supportive care  Acute on chronic renal failure CKD stage IIIb -Serum creatinine 1.81 -Improved since arrival at greater than 2 -Serum creatinine baseline 1.4-1.7 -Monitor urine output -Monitor/trend/replete electrolytes as needed -Daily BMP -Avoid nephrotoxic agents were able  Vomiting and diarrhea -Patient with continued diarrhea -Respiratory panel positive for rhinovirus and enterovirus -GI panel is pending -C. difficile pending -Continued management per IM  Paroxysmal atrial fibrillation -Currently maintaining sinus rhythm -Continued on amiodarone -Continued on apixaban 5 mg twice daily for CHA2DS2-VASc score of at least 7 for stroke prophylaxis -Continue with telemetry monitoring  Essential hypertension -Blood pressure 174/63 -Continued on bisoprolol -Vital signs per unit protocol  Mixed hyperlipidemia -LDL 39 -Continued on statin therapy  Type 2 diabetes -Continued on insulin therapy -Hemoglobin A1c 6.3 -Continue management per IM  Tobacco abuse -Continues to smoke 2 to 3 cigarettes/day -Cessation is recommended   Risk Assessment/Risk Scores:     TIMI Risk Score for Unstable Angina or Non-ST Elevation MI:   The patient's TIMI risk score is 5, which indicates a 26% risk of all cause mortality, new or recurrent myocardial infarction or need for urgent revascularization in the next 14 days.    CHA2DS2-VASc Score = 7   This indicates a 11.2% annual risk of stroke. The patient's score is based upon: CHF History: 0 HTN History: 1 Diabetes History: 1 Stroke History: 2 Vascular Disease History: 1 Age Score: 2 Gender Score: 0         For questions or updates, please contact [facility] Please consult [email or web address] for contact info under    Signed, Marlie Kuennen, NP  06/08/2023 10:19 AM

## 2023-06-08 NOTE — Progress Notes (Signed)
PROGRESS NOTE  JONES STRIMPLE WUJ:[id number] DOB: August 08, 1945 DOA: 06/06/2023 PCP: [name], FNP  Brief History:  77 year old male with a history of coronary artery disease/NSTEMI, paroxysmal atrial fibrillation, hypertension, diabetes mellitus type 2, hyperlipidemia, COPD, chronic respiratory failure on 2 L, tobacco abuse, CKD stage III, and CVA presenting with 3-day history of chest pain, shortness breath, nausea, vomiting.  The patient states that he has had on and off chest pain lasting about an hour.  There is no exacerbating or alleviating factors.  He states that he has been low but more short of breath than usual.  He has a nonproductive cough.  Denies any hemoptysis.  He denies any fevers, chills, headache, neck pain.  There is no hematemesis.  He denies any hematochezia or melena, but he states that he has been having some loose stools.  He continues to smoke about 2 cigarettes/day.  He has over a 40-pack-year history of tobacco.  Because of worsening generalized weakness, the patient presented for further evaluation and treatment. In the ED, the patient was afebrile hemodynamically stable.  WBC 9.8, hemoglobin 14.2, platelets 359.  Sodium 140, potassium 3.5, bicarbonate 24, serum creatinine 2.57.  The patient was started on IV fluids.  He was admitted for further evaluation and treatment of his chest pain, shortness of breath and vomiting.   Assessment/Plan: COPD exacerbation -Patient is wheezing on examination with increasing shortness of breath -Started DuoNebs -Started Pulmicort -Started Brovana -Started IV Solu-Medrol   Chest pain/elevated troponin -Troponin trend is flat 76>> 69 -Chest pain has resolved since hospitalization -Continue Imdur -Cardiology consult appreciated>>no further inpatient work up -Patient saw Dr. Wyline Mood 04/30/2023 at which time a Lexiscan was planned>>scheduled for 06/16/23 -Status post DES to LAD times 2--2015   Acute on chronic renal  failure --CKD stage IIIb -Baseline creatinine 1.4-1.7 -due to volume depletion -Serum creatinine peaked 2.57 -Improving with IV fluids>> continue   Vomiting and diarrhea -covid 19--neg -continue pantoprazole -vomiting and diarrhea resolved.  No stool to collect.  Tolerating diet   Chronic respiratory failure with hypoxia -Chronically on 2 L nasal cannula at home although he does not use it consistently   Paroxysmal atrial fibrillation -Currently in sinus rhythm -Continue amiodarone -Continue apixaban   Essential hypertension -Continue bisoprolol   Mixed hyperlipidemia -Continue statin   Diabetes mellitus type 2 -02/02/2023 hemoglobin A1c 6.3 -NovoLog sliding scale -Anticipate elevated CBGs secondary to steroids   Tobacco abuse -Tobacco cessation discussed   Anxiety/depression -Continue home dose Wellbutrin and alprazolam         Family Communication: daughter/son updated 12/29   Consultants:  cardiology   Code Status:  FULL   DVT Prophylaxis: apixaban     Procedures: As Listed in Progress Note Above   Antibiotics: None            Subjective: Pt states he is breathing better.  States n/v/d better.  Tolerating diet.  Denies f/c, abd pain  Objective: Vitals:   06/07/23 2043 06/08/23 0401 06/08/23 1003 06/08/23 1514  BP: (!) 128/46 (!) 153/56 (!) 174/63 (!) 158/68  Pulse: 63 (!) 58 70 64  Resp: 17 17  18   Temp: 98.6 F (37 C) 98.7 F (37.1 C) 97.8 F (36.6 C) 98.5 F (36.9 C)  TempSrc: Oral Oral Oral Oral  SpO2: 97% 96% 98% 99%  Weight:      Height:        Intake/Output Summary (Last 24 hours) at 06/08/2023  1714 Last data filed at 06/08/2023 1555 Gross per 24 hour  Intake 1302.8 ml  Output 800 ml  Net 502.8 ml   Weight change:  Exam:  General:  Pt is alert, follows commands appropriately, not in acute distress HEENT: No icterus, No thrush, No neck mass, [place]/AT Cardiovascular: RRR, S1/S2, no rubs, no gallops Respiratory:  bibasilar rales.  Minimal basilar wheeze Abdomen: Soft/+BS, non tender, non distended, no guarding Extremities: 1 + UE edema, No lymphangitis, No petechiae, No rashes, no synovitis   Data Reviewed: I have personally reviewed following labs and imaging studies Basic Metabolic Panel: Recent Labs  Lab 06/06/23 1552 06/06/23 1758 06/07/23 0350 06/08/23 0432  NA 139 138 140 137  K 3.9 3.8 3.5 4.0  CL 104 104 106 105  CO2 24 24 24 25   GLUCOSE 108* 107* 91 143*  BUN 43* 43* 37* 35*  CREATININE 2.57* 2.56* 2.09* 1.81*  CALCIUM 8.7* 8.8* 8.6* 9.0  MG  --   --   --  2.2   Liver Function Tests: No results for input(s): "AST", "ALT", "ALKPHOS", "BILITOT", "PROT", "ALBUMIN" in the last 168 hours. No results for input(s): "LIPASE", "AMYLASE" in the last 168 hours. No results for input(s): "AMMONIA" in the last 168 hours. Coagulation Profile: No results for input(s): "INR", "PROTIME" in the last 168 hours. CBC: Recent Labs  Lab 06/06/23 1552  WBC 9.8  HGB 14.2  HCT 43.4  MCV 85.3  PLT 359   Cardiac Enzymes: No results for input(s): "CKTOTAL", "CKMB", "CKMBINDEX", "TROPONINI" in the last 168 hours. BNP: Invalid input(s): "POCBNP" CBG: Recent Labs  Lab 06/07/23 1615 06/07/23 2046 06/08/23 0741 06/08/23 1139 06/08/23 1649  GLUCAP 90 153* 135* 157* 96   HbA1C: Recent Labs    06/06/23 1552  HGBA1C 6.6*   Urine analysis:    Component Value Date/Time   COLORURINE YELLOW 07/10/2021 0750   APPEARANCEUR CLEAR 07/10/2021 0750   APPEARANCEUR Clear 04/18/2021 1539   LABSPEC >1.030 (H) 07/10/2021 0750   PHURINE 5.5 07/10/2021 0750   GLUCOSEU 100 (A) 07/10/2021 0750   HGBUR TRACE (A) 07/10/2021 0750   BILIRUBINUR NEGATIVE 07/10/2021 0750   BILIRUBINUR Negative 04/18/2021 1539   KETONESUR NEGATIVE 07/10/2021 0750   PROTEINUR 30 (A) 07/10/2021 0750   UROBILINOGEN 0.2 06/22/2014 1918   NITRITE NEGATIVE 07/10/2021 0750   LEUKOCYTESUR NEGATIVE 07/10/2021 0750   Sepsis  Labs: @LABRCNTIP (procalcitonin:4,lacticidven:4) ) Recent Results (from the past 240 hours)  SARS Coronavirus 2 by RT PCR (hospital order, performed in [facility] hospital lab) *cepheid single result test* Anterior Nasal Swab     Status: None   Collection Time: 06/07/23  1:00 PM   Specimen: Anterior Nasal Swab  Result Value Ref Range Status   SARS Coronavirus 2 by RT PCR NEGATIVE NEGATIVE Final    Comment: (NOTE) SARS-CoV-2 target nucleic acids are NOT DETECTED.  The SARS-CoV-2 RNA is generally detectable in upper and lower respiratory specimens during the acute phase of infection. The lowest concentration of SARS-CoV-2 viral copies this assay can detect is 250 copies / mL. A negative result does not preclude SARS-CoV-2 infection and should not be used as the sole basis for treatment or other patient management decisions.  A negative result may occur with improper specimen collection / handling, submission of specimen other than nasopharyngeal swab, presence of viral mutation(s) within the areas targeted by this assay, and inadequate number of viral copies (<250 copies / mL). A negative result must be combined with clinical observations, patient history,  and epidemiological information.  Fact Sheet for Patients:   RoadLapTop.co.za  Fact Sheet for Healthcare Providers: http://kim-miller.com/  This test is not yet approved or  cleared by the [place] FDA and has been authorized for detection and/or diagnosis of SARS-CoV-2 by FDA under an Emergency Use Authorization (EUA).  This EUA will remain in effect (meaning this test can be used) for the duration of the COVID-19 declaration under Section 564(b)(1) of the Act, 21 U.S.C. section 360bbb-3(b)(1), unless the authorization is terminated or revoked sooner.  Performed at [facility], [address] [place], [place] [address]   Respiratory (~20 pathogens) panel by PCR     Status:  Abnormal   Collection Time: 06/07/23  1:00 PM   Specimen: Nasopharyngeal Swab; Respiratory  Result Value Ref Range Status   Adenovirus NOT DETECTED NOT DETECTED Final   Coronavirus 229E NOT DETECTED NOT DETECTED Final    Comment: (NOTE) The Coronavirus on the Respiratory Panel, DOES NOT test for the novel  Coronavirus (2019 nCoV)    Coronavirus HKU1 NOT DETECTED NOT DETECTED Final   Coronavirus NL63 NOT DETECTED NOT DETECTED Final   Coronavirus OC43 NOT DETECTED NOT DETECTED Final   Metapneumovirus NOT DETECTED NOT DETECTED Final   Rhinovirus / Enterovirus DETECTED (A) NOT DETECTED Final   Influenza A NOT DETECTED NOT DETECTED Final   Influenza B NOT DETECTED NOT DETECTED Final   Parainfluenza Virus 1 NOT DETECTED NOT DETECTED Final   Parainfluenza Virus 2 NOT DETECTED NOT DETECTED Final   Parainfluenza Virus 3 NOT DETECTED NOT DETECTED Final   Parainfluenza Virus 4 NOT DETECTED NOT DETECTED Final   Respiratory Syncytial Virus NOT DETECTED NOT DETECTED Final   Bordetella pertussis NOT DETECTED NOT DETECTED Final   Bordetella Parapertussis NOT DETECTED NOT DETECTED Final   Chlamydophila pneumoniae NOT DETECTED NOT DETECTED Final   Mycoplasma pneumoniae NOT DETECTED NOT DETECTED Final    Comment: Performed at [facility], [address], [place], [place] [address]     Scheduled Meds:  amiodarone  200 mg Oral Daily   apixaban  5 mg Oral BID   arformoterol  15 mcg Nebulization BID   bisoprolol  5 mg Oral Daily   budesonide (PULMICORT) nebulizer solution  0.5 mg Nebulization BID   buPROPion  150 mg Oral Daily   insulin aspart  0-15 Units Subcutaneous TID WC   insulin aspart  0-5 Units Subcutaneous QHS   ipratropium-albuterol  3 mL Nebulization Q6H   isosorbide mononitrate  60 mg Oral Daily   [START ON 06/09/2023] methylPREDNISolone (SOLU-MEDROL) injection  40 mg Intravenous Q12H   pantoprazole  40 mg Oral BID   rosuvastatin  20 mg Oral Daily   Continuous  Infusions:  Procedures/Studies: DG Chest 2 View Result Date: 06/06/2023 CLINICAL DATA:  Acute left-sided chest pain beginning 3 hours ago. Shortness of breath. EXAM: CHEST - 2 VIEW COMPARISON:  05/14/2022 FINDINGS: Stable mild cardiomegaly. Stable changes of COPD. Both lungs are clear. The visualized skeletal structures are unremarkable. IMPRESSION: No active cardiopulmonary disease. COPD. Electronically Signed   By: Danae Orleans M.D.   On: 06/06/2023 17:09    Catarina Hartshorn, DO  [facility]  If 7PM-7AM, please contact night-coverage [email or web address] Password [email or web address] 06/08/2023, 5:14 PM   LOS: 1 day

## 2023-06-08 NOTE — Evaluation (Signed)
Physical Therapy Evaluation Patient Details Name: Ronald [name] MRN: [id number] DOB: 12-14-1945 Today's Date: 06/08/2023  History of Present Illness  Ronald [name] is a 77 y.o. male with medical history significant of atrial flutter, COPD, history of NSTEMI with coronary artery disease status post stenting, GERD, hypertension, diabetes, chronic kidney disease, chronic respiratory failure on home oxygen.  Patient has been having nausea and vomiting since this morning.  He has been intolerant of oral food and liquid.  He came to the hospital for evaluation.  No fevers, chills.  His breathing is at baseline.  No cough, chest pain, shortness of breath.  His abdomen is sore, but nontender.   Clinical Impression  Patient unsteady on feet having to lean on armrest of chair during transfers, safer using RW and able to ambulate in room without loss of balance, able to ambulate into bathroom and transfer/to from commode using IV pole and tolerated sitting up in chair after therapy.  Patient on 2 LPM O2 during functional activities with SpO2 above 92%.  Patient will benefit from continued skilled physical therapy in hospital and recommended venue below to increase strength, balance, endurance for safe ADLs and gait.       If plan is discharge home, recommend the following: A little help with walking and/or transfers;A little help with bathing/dressing/bathroom;Help with stairs or ramp for entrance;Assistance with cooking/housework   Can travel by private vehicle        Equipment Recommendations None recommended by PT  Recommendations for Other Services       Functional Status Assessment Patient has had a recent decline in their functional status and demonstrates the ability to make significant improvements in function in a reasonable and predictable amount of time.     Precautions / Restrictions Precautions Precautions: Fall Restrictions Weight Bearing Restrictions Per Provider Order: No       Mobility  Bed Mobility Overal bed mobility: Modified Independent                  Transfers Overall transfer level: Needs assistance Equipment used: Rolling walker (2 wheels), None Transfers: Bed to chair/wheelchair/BSC, Sit to/from Stand Sit to Stand: Supervision   Step pivot transfers: Supervision       General transfer comment: slightly labored movement    Ambulation/Gait Ambulation/Gait assistance: Contact guard assist Gait Distance (Feet): 25 Feet Assistive device: Rolling walker (2 wheels), IV Pole Gait Pattern/deviations: Decreased step length - right, Decreased step length - left, Decreased stride length Gait velocity: decreased     General Gait Details: slightly labored movement without loss of balance with good return for using RW and pushing IV pole, limited mostly due to fatigue, on 2 LPM with SpO2 at 92-95 %  Stairs            Wheelchair Mobility     Tilt Bed    Modified Rankin (Stroke Patients Only)       Balance Overall balance assessment: Needs assistance Sitting-balance support: Feet supported, No upper extremity supported Sitting balance-Leahy Scale: Good Sitting balance - Comments: seated at EOB   Standing balance support: During functional activity, No upper extremity supported Standing balance-Leahy Scale: Fair Standing balance comment: fair/poor without AD, fair/good using RW                             Pertinent Vitals/Pain Pain Assessment Pain Assessment: No/denies pain    Home Living Family/patient expects to be discharged to:: Private  residence Living Arrangements: Spouse/significant other Available Help at Discharge: Family;Available 24 hours/day (ex-wife) Type of Home: House Home Access: Stairs to enter Entrance Stairs-Rails: Right;Left;Can reach both Entrance Stairs-Number of Steps: 2   Home Layout: One level Home Equipment: Rollator (4 wheels);BSC/3in1;Shower seat      Prior Function Prior  Level of Function : Independent/Modified Independent;Driving             Mobility Comments: Community ambulation without  AD, drives ADLs Comments: Ex-wife performs iADLs, Patient independent in ADLs     Extremity/Trunk Assessment   Upper Extremity Assessment Upper Extremity Assessment: Overall WFL for tasks assessed    Lower Extremity Assessment Lower Extremity Assessment: Generalized weakness    Cervical / Trunk Assessment Cervical / Trunk Assessment: Kyphotic  Communication   Communication Communication: No apparent difficulties Cueing Techniques: Verbal cues;Tactile cues  Cognition Arousal: Alert Behavior During Therapy: WFL for tasks assessed/performed Overall Cognitive Status: Within Functional Limits for tasks assessed                                          General Comments      Exercises     Assessment/Plan    PT Assessment Patient needs continued PT services  PT Problem List Decreased strength;Decreased activity tolerance;Decreased balance;Decreased mobility       PT Treatment Interventions DME instruction;Stair training;Gait training;Functional mobility training;Therapeutic activities;Therapeutic exercise;Balance training;Patient/family education    PT Goals (Current goals can be found in the Care Plan section)  Acute Rehab PT Goals Patient Stated Goal: return home with family to assist PT Goal Formulation: With patient Time For Goal Achievement: 06/12/23 Potential to Achieve Goals: Good    Frequency Min 3X/week     Co-evaluation               AM-PAC PT "6 Clicks" Mobility  Outcome Measure Help needed turning from your back to your side while in a flat bed without using bedrails?: None Help needed moving from lying on your back to sitting on the side of a flat bed without using bedrails?: None Help needed moving to and from a bed to a chair (including a wheelchair)?: A Little Help needed standing up from a chair  using your arms (e.g., wheelchair or bedside chair)?: A Little Help needed to walk in hospital room?: A Little Help needed climbing 3-5 steps with a railing? : A Little 6 Click Score: 20    End of Session Equipment Utilized During Treatment: Oxygen Activity Tolerance: Patient tolerated treatment well;Patient limited by fatigue Patient left: in chair;with call bell/phone within reach Nurse Communication: Mobility status PT Visit Diagnosis: Unsteadiness on feet (R26.81);Other abnormalities of gait and mobility (R26.89);Muscle weakness (generalized) (M62.81)    Time: 1610-9604 PT Time Calculation (min) (ACUTE ONLY): 30 min   Charges:   PT Evaluation $PT Eval Moderate Complexity: 1 Mod PT Treatments $Therapeutic Activity: 23-37 mins PT General Charges $$ ACUTE PT VISIT: 1 Visit         2:47 PM, 06/08/23 Ocie Bob, MPT Physical Therapist with [facility] [phone] office [phone] mobile phone

## 2023-06-09 DIAGNOSIS — N1832 Chronic kidney disease, stage 3b: Secondary | ICD-10-CM | POA: Diagnosis not present

## 2023-06-09 DIAGNOSIS — N179 Acute kidney failure, unspecified: Secondary | ICD-10-CM | POA: Diagnosis not present

## 2023-06-09 DIAGNOSIS — J9611 Chronic respiratory failure with hypoxia: Secondary | ICD-10-CM | POA: Diagnosis not present

## 2023-06-09 DIAGNOSIS — J441 Chronic obstructive pulmonary disease with (acute) exacerbation: Secondary | ICD-10-CM | POA: Diagnosis not present

## 2023-06-09 LAB — GLUCOSE, CAPILLARY
Glucose-Capillary: 155 mg/dL — ABNORMAL HIGH (ref 70–99)
Glucose-Capillary: 103 mg/dL — ABNORMAL HIGH (ref 70–99)
Glucose-Capillary: 130 mg/dL — ABNORMAL HIGH (ref 70–99)

## 2023-06-09 LAB — BASIC METABOLIC PANEL
Anion gap: 8 (ref 5–15)
BUN: 46 mg/dL — ABNORMAL HIGH (ref 8–23)
CO2: 23 mmol/L (ref 22–32)
Calcium: 9.1 mg/dL (ref 8.9–10.3)
Chloride: 105 mmol/L (ref 98–111)
Creatinine, Ser: 1.75 mg/dL — ABNORMAL HIGH (ref 0.61–1.24)
GFR, Estimated: 40 mL/min — ABNORMAL LOW (ref >60)
Glucose, Bld: 157 mg/dL — ABNORMAL HIGH (ref 70–99)
Potassium: 4.3 mmol/L (ref 3.5–5.1)
Sodium: 136 mmol/L (ref 135–145)

## 2023-06-09 LAB — HEMOGLOBIN A1C
Hgb A1c MFr Bld: 6.4 % — ABNORMAL HIGH (ref 4.8–5.6)
Mean Plasma Glucose: 137 mg/dL

## 2023-06-09 MED ORDER — AMLODIPINE BESYLATE 5 MG PO TABS
5.0000 mg | ORAL_TABLET | Freq: Every day | ORAL | Status: DC
  Administered 2023-06-09: 5 mg via ORAL
  Filled 2023-06-09: qty 1

## 2023-06-09 MED ORDER — APIXABAN 5 MG PO TABS: 5.0000 mg | ORAL_TABLET | Freq: Two times a day (BID) | ORAL | 1 refills | Status: DC

## 2023-06-09 MED ORDER — PREDNISONE 50 MG PO TABS: 50.0000 mg | ORAL_TABLET | Freq: Every day | ORAL | 0 refills | Status: DC

## 2023-06-09 MED ORDER — PREDNISONE 20 MG PO TABS: 50.0000 mg | ORAL_TABLET | Freq: Every day | ORAL | Status: DC

## 2023-06-09 MED ORDER — AMLODIPINE BESYLATE 5 MG PO TABS: 5.0000 mg | ORAL_TABLET | Freq: Every day | ORAL | 1 refills | Status: DC

## 2023-06-09 NOTE — TOC Transition Note (Addendum)
 Transition of Care [facility]) - Discharge Note   Patient Details  Name: Ronald [name] MRN: [id number] Date of Birth: January 14, 1946  [facility] CM/SW Contact:  Mcarthur Saddie Kim, LCSW Phone Number: 06/09/2023, 12:40 PM   Clinical Narrative:  Pt d/c today. Per Dorothe with [place], start of care will be Monday. MD notified and agreeable. Pt's son updated. Pt's home O2 is through [employer] and LCSW confirmed order is for 3L continuous. Pt's son reports pt will need portable tank delivered to hospital for transport home. Dolanda with [employer] notified.      Final next level of care: Home w Home Health Services Barriers to Discharge: Barriers Resolved   Patient Goals and CMS Choice Patient states their goals for this hospitalization and ongoing recovery are:: return home   Choice offered to / list presented to : Patient [place] ownership interest in [facility].provided to::  (n/a)    Discharge Placement                       Discharge Plan and Services Additional resources added to the After Visit Summary for   In-house Referral: Clinical Social Work   Post Acute Care Choice: Home Health                    HH Arranged: RN, PT [facility] Agency: [facility] Date [facility] Agency Contacted: 06/08/23 Time HH Agency Contacted: 1455 Representative spoke with at [facility] Agency: Dorothe  Social Drivers of Health (SDOH) Interventions SDOH Screenings   Food Insecurity: No Food Insecurity (06/06/2023)  Housing: Unknown (06/06/2023)  Transportation Needs: No Transportation Needs (06/06/2023)  Utilities: Not At Risk (06/06/2023)  Alcohol Screen: Low Risk  (02/04/2023)  Depression (PHQ2-9): Low Risk  (02/04/2023)  Financial Resource Strain: Low Risk  (02/04/2023)  Physical Activity: Inactive (02/04/2023)  Social Connections: Socially Isolated (06/09/2023)  Stress: No Stress Concern Present (02/04/2023)  Tobacco Use: High Risk (06/06/2023)  Health Literacy: Adequate  Health Literacy (02/04/2023)     Readmission Risk Interventions    06/08/2023    2:52 PM 12/09/2021    8:42 AM 11/25/2021    1:34 PM  Readmission Risk Prevention Plan  Transportation Screening Complete Complete Complete  HRI or Home Care Consult Complete    Social Work Consult for Recovery Care Planning/Counseling Complete    Palliative Care Screening Not Applicable    Medication Review Oceanographer) Complete Complete Complete  PCP or Specialist appointment within 3-5 days of discharge   Not Complete  HRI or Home Care Consult  Complete Complete  SW Recovery Care/Counseling Consult  Complete Complete  Palliative Care Screening  Not Applicable Not Applicable  Skilled Nursing Facility  Not Applicable Not Applicable

## 2023-06-09 NOTE — Progress Notes (Addendum)
 Nurse at bedside,patient out of bed to chair this am,some confusion and agitation noted.Patient had pulled off his telemetry and said he was going home.Patient informed that I do not have discharge orders at this time. Dr Tat notified. Plan of care on going.

## 2023-06-09 NOTE — Progress Notes (Signed)
 Nurse at bedside,patient had ambulated in hallway with NT, was notified by Central telemetry that  there was a change in the QRS wave, 0.08 to 0.18 was the change. B/P 206/71 hr 79, patient's face was red and flushed  also patient was short of breath from the ambulation in the hallway.Dr Tat notified. At present time patient is out of bed to chair,oxygen  at 2 liters via nasal canula.No c/o pain noted. Plan of care on going.

## 2023-06-09 NOTE — Plan of Care (Signed)
  Problem: Health Behavior/Discharge Planning: Goal: Ability to manage health-related needs will improve Outcome: Progressing   Problem: Clinical Measurements: Goal: Ability to maintain clinical measurements within normal limits will improve Outcome: Progressing Goal: Will remain free from infection Outcome: Progressing Goal: Diagnostic test results will improve Outcome: Progressing Goal: Respiratory complications will improve Outcome: Progressing   Problem: Activity: Goal: Risk for activity intolerance will decrease Outcome: Progressing   Problem: Elimination: Goal: Will not experience complications related to bowel motility Outcome: Progressing Goal: Will not experience complications related to urinary retention Outcome: Progressing   Problem: Pain Management: Goal: General experience of comfort will improve Outcome: Progressing   Problem: Safety: Goal: Ability to remain free from injury will improve Outcome: Progressing   Problem: Skin Integrity: Goal: Risk for impaired skin integrity will decrease Outcome: Progressing   Problem: Health Behavior/Discharge Planning: Goal: Ability to manage health-related needs will improve Outcome: Progressing   Problem: Metabolic: Goal: Ability to maintain appropriate glucose levels will improve Outcome: Progressing

## 2023-06-09 NOTE — Consult Note (Signed)
 [facility] Liaison Note  06/09/2023  Ronald [name] 1945/11/27 [id number]  Location: RN Hospital Liaison screened the patient remotely at [facility].  Insurance: [employer] Advantage   Ronald [name] is a 77 y.o. male who is a Primary Care Patient of [place], Bari LABOR, FNP [facility]. The patient was screened for readmission hospitalization with noted high risk score for unplanned readmission risk with 1 IP in 6 months.  The patient was assessed for potential Care Management service needs for post hospital transition for care coordination. Review of patient's electronic medical record reveals patient was admitted with acute kidney injury. Pt will be discharged with [facility] for ongoing PT services. No anticipated needs presented at this time for VBCI.   Plan: [facility] Liaison will continue to follow progress and disposition to asess for post hospital community care coordination/management needs.  Referral request for community care coordination: anticipate Transitions of Care Team follow up.   VBCI Care Management/Population Health does not replace or interfere with any arrangements made by the Inpatient Transition of Care team.   For questions contact:   Olam Ku, RN, [facility] Liaison [place]   [facility], Population Health Office Hours MTWF  8:00 am-6:00 pm Direct Dial: [phone] mobile [phone] [Office toll free line] Office Hours are M-F 8:30 - 5 pm [email or web address]

## 2023-06-09 NOTE — Progress Notes (Signed)
 Patient discharged with instructions given on medications and follow up appointments,patient and family verbalized understanding. Prescriptions sent to Pharmacy of choice documented on AVS. IV discontinued,catheter intact. Accompanied by staff to an awaiting vehicle.

## 2023-06-09 NOTE — Discharge Summary (Addendum)
 Physician Discharge Summary   Patient: Ronald [name] MRN: [id number] DOB: 1946/03/29  Admit date:     06/06/2023  Discharge date: 06/09/23  Discharge Physician: Alm Kasai Beltran   PCP: [name] LABOR, FNP   Recommendations at discharge:   Please follow up with primary care provider within 1-2 weeks  Please repeat BMP and CBC in one week     Hospital Course: 77 year old male with a history of coronary artery disease/NSTEMI, paroxysmal atrial fibrillation, hypertension, diabetes mellitus type 2, hyperlipidemia, COPD, chronic respiratory failure on 2 L, tobacco abuse, CKD stage III, and CVA presenting with 3-day history of chest pain, shortness breath, nausea, vomiting.  The patient states that he has had on and off chest pain lasting about an hour.  There is no exacerbating or alleviating factors.  He states that he has been low but more short of breath than usual.  He has a nonproductive cough.  Denies any hemoptysis.  He denies any fevers, chills, headache, neck pain.  There is no hematemesis.  He denies any hematochezia or melena, but he states that he has been having some loose stools.  He continues to smoke about 2 cigarettes/day.  He has over a 40-pack-year history of tobacco.  Because of worsening generalized weakness, the patient presented for further evaluation and treatment. In the ED, the patient was afebrile hemodynamically stable.  WBC 9.8, hemoglobin 14.2, platelets 359.  Sodium 140, potassium 3.5, bicarbonate 24, serum creatinine 2.57.  The patient was started on IV fluids.  He was admitted for further evaluation and treatment of his chest pain, shortness of breath and vomiting.  Assessment and Plan: COPD exacerbation -Patient is wheezing on examination with increasing shortness of breath -COVID--neg -viral respiratory panel>>positive rhinovirus/enterovirus -Started DuoNebs -Started Pulmicort  -Started Brovana  -Started IV Solu-Medrol  -d/c home with prednisone  x 3 more days    Chest pain/elevated troponin -Troponin trend is flat 76>> 69 -Chest pain has resolved since hospitalization -Continue Imdur  -Cardiology consult appreciated>>no further inpatient work up -Patient saw Dr. Alvan 04/30/2023 at which time a Lexiscan was planned>>scheduled for 06/16/23 -Status post DES to LAD times 2--2015   Acute on chronic renal failure --CKD stage IIIb -Baseline creatinine 1.4-1.7 -due to volume depletion -Serum creatinine peaked 2.57 -Improving with IV fluids>> continued -serum creatinine 1.75 on day of dc   Vomiting and diarrhea -covid 19--neg -continue pantoprazole  -vomiting and diarrhea resolved.  No stool to collect.  Tolerating diet   Chronic respiratory failure with hypoxia -Chronically on 2-3  L nasal cannula at home although he does not use it consistently, but is suppose to be on 3L 24/7   Paroxysmal atrial fibrillation -Currently in sinus rhythm -Continue amiodarone /bisoprolol  -Continue apixaban  -12/31--personally reviewed EKG--sinus, IVCD, QTc--445 (480 on 12/28)   Essential hypertension -Continue bisoprolol  -add amlodipine  -I checked vitals at 1200 on day of d/c 97.8--HR78--RR16--162/72--97% on 2L   Mixed hyperlipidemia -Continue statin   Diabetes mellitus type 2 -02/02/2023 hemoglobin A1c 6.3 -NovoLog  sliding scale -Anticipate elevated CBGs secondary to steroids   Tobacco abuse -Tobacco cessation discussed   Anxiety/depression -Continue home dose Buspar  Wellbutrin  and alprazolam  --PDMP reviewed--no red flags         Consultants: none Procedures performed: none  Disposition: Home Diet recommendation:  Cardiac and Carb modified diet DISCHARGE MEDICATION: Allergies as of 06/09/2023       Reactions   Gabapentin Anxiety   Unknown reaction   Metformin  And Related Rash        Medication List     STOP  taking these medications    doxycycline  100 MG tablet Commonly known as: VIBRA -TABS   furosemide  40 MG tablet Commonly  known as: LASIX    potassium chloride  SA 20 MEQ tablet Commonly known as: KLOR-CON  M       TAKE these medications    acetaminophen  325 MG tablet Commonly known as: TYLENOL  Take 2 tablets (650 mg total) by mouth every 6 (six) hours as needed for mild pain (or Fever >/= 101).   albuterol  108 (90 Base) MCG/ACT inhaler Commonly known as: VENTOLIN  HFA Inhale 2 puffs into the lungs every 6 (six) hours as needed for wheezing or shortness of breath.   ALPRAZolam  0.25 MG tablet Commonly known as: XANAX  Take 1 tablet (0.25 mg total) by mouth 2 (two) times daily as needed for anxiety.   amiodarone  200 MG tablet Commonly known as: PACERONE  Take 1 tablet by mouth once daily   amLODipine  5 MG tablet Commonly known as: NORVASC  Take 1 tablet (5 mg total) by mouth daily.   apixaban  5 MG Tabs tablet Commonly known as: ELIQUIS  Take 1 tablet (5 mg total) by mouth 2 (two) times daily.   benzonatate  200 MG capsule Commonly known as: TESSALON  Take 1 capsule (200 mg total) by mouth 3 (three) times daily as needed.   bisoprolol  5 MG tablet Commonly known as: ZEBETA  TAKE 1 TABLET BY MOUTH ONCE DAILY IN  PLACE  OF  METOPROLOL  What changed: See the new instructions.   Breztri  Aerosphere 160-9-4.8 MCG/ACT Aero Generic drug: Budeson-Glycopyrrol-Formoterol  Inhale 2 puffs into the lungs 2 (two) times daily.   budesonide  0.5 MG/2ML nebulizer solution Commonly known as: PULMICORT  Take 2 mLs (0.5 mg total) by nebulization 2 (two) times daily.   buPROPion  150 MG 24 hr tablet Commonly known as: WELLBUTRIN  XL Take 1 tablet by mouth once daily   busPIRone  7.5 MG tablet Commonly known as: BUSPAR  Take 1 tablet (7.5 mg total) by mouth 3 (three) times daily as needed.   dapagliflozin  propanediol 10 MG Tabs tablet Commonly known as: FARXIGA  Take 1 tablet (10 mg total) by mouth daily before breakfast.   ipratropium-albuterol  0.5-2.5 (3) MG/3ML Soln Commonly known as: DUONEB Take 3 mLs by  nebulization every 6 (six) hours as needed (asthma).   isosorbide  mononitrate 60 MG 24 hr tablet Commonly known as: IMDUR  Take 1 tablet (60 mg total) by mouth daily.   nitroGLYCERIN  0.4 MG SL tablet Commonly known as: NITROSTAT  Place 1 tablet (0.4 mg total) under the tongue every 5 (five) minutes x 3 doses as needed for chest pain (if no relief after 2nd dose, proceed to ED or call 911).   omeprazole  20 MG capsule Commonly known as: PRILOSEC  Take 1 capsule (20 mg total) by mouth daily.   OXYGEN  Inhale 4 L into the lungs at bedtime as needed (shortness of breath).   polyethylene glycol 17 g packet Commonly known as: MIRALAX  / GLYCOLAX  Take 17 g by mouth daily.   rosuvastatin  20 MG tablet Commonly known as: CRESTOR  Take 1 tablet (20 mg total) by mouth daily.        Follow-up Information     [facility] Follow up.   Why: Will contact you to schedule home health visits.               Discharge Exam: Filed Weights   06/06/23 1511  Weight: 72.6 kg   HEENT:  [place]/AT, No thrush, no icterus CV:  RRR, no rub, no S3, no S4 Lung:  bibasilar rales.  No wheeze Abd:  soft/+BS, NT Ext:  +UE edema, no lymphangitis, no synovitis, no rash   Condition at discharge: stable  The results of significant diagnostics from this hospitalization (including imaging, microbiology, ancillary and laboratory) are listed below for reference.   Imaging Studies: DG Chest 2 View Result Date: 06/06/2023 CLINICAL DATA:  Acute left-sided chest pain beginning 3 hours ago. Shortness of breath. EXAM: CHEST - 2 VIEW COMPARISON:  05/14/2022 FINDINGS: Stable mild cardiomegaly. Stable changes of COPD. Both lungs are clear. The visualized skeletal structures are unremarkable. IMPRESSION: No active cardiopulmonary disease. COPD. Electronically Signed   By: Norleen DELENA Kil M.D.   On: 06/06/2023 17:09    Microbiology: Results for orders placed or performed during the hospital encounter of 06/06/23   SARS Coronavirus 2 by RT PCR (hospital order, performed in [facility] hospital lab) *cepheid single result test* Anterior Nasal Swab     Status: None   Collection Time: 06/07/23  1:00 PM   Specimen: Anterior Nasal Swab  Result Value Ref Range Status   SARS Coronavirus 2 by RT PCR NEGATIVE NEGATIVE Final    Comment: (NOTE) SARS-CoV-2 target nucleic acids are NOT DETECTED.  The SARS-CoV-2 RNA is generally detectable in upper and lower respiratory specimens during the acute phase of infection. The lowest concentration of SARS-CoV-2 viral copies this assay can detect is 250 copies / mL. A negative result does not preclude SARS-CoV-2 infection and should not be used as the sole basis for treatment or other patient management decisions.  A negative result may occur with improper specimen collection / handling, submission of specimen other than nasopharyngeal swab, presence of viral mutation(s) within the areas targeted by this assay, and inadequate number of viral copies (<250 copies / mL). A negative result must be combined with clinical observations, patient history, and epidemiological information.  Fact Sheet for Patients:   roadlaptop.co.za  Fact Sheet for Healthcare Providers: http://kim-miller.com/  This test is not yet approved or  cleared by the [place]  FDA and has been authorized for detection and/or diagnosis of SARS-CoV-2 by FDA under an Emergency Use Authorization (EUA).  This EUA will remain in effect (meaning this test can be used) for the duration of the COVID-19 declaration under Section 564(b)(1) of the Act, 21 U.S.C. section 360bbb-3(b)(1), unless the authorization is terminated or revoked sooner.  Performed at [facility], [address], [place], [place] [address]   Respiratory (~20 pathogens) panel by PCR     Status: Abnormal   Collection Time: 06/07/23  1:00 PM   Specimen: Nasopharyngeal Swab; Respiratory   Result Value Ref Range Status   Adenovirus NOT DETECTED NOT DETECTED Final   Coronavirus 229E NOT DETECTED NOT DETECTED Final    Comment: (NOTE) The Coronavirus on the Respiratory Panel, DOES NOT test for the novel  Coronavirus (2019 nCoV)    Coronavirus HKU1 NOT DETECTED NOT DETECTED Final   Coronavirus NL63 NOT DETECTED NOT DETECTED Final   Coronavirus OC43 NOT DETECTED NOT DETECTED Final   Metapneumovirus NOT DETECTED NOT DETECTED Final   Rhinovirus / Enterovirus DETECTED (A) NOT DETECTED Final   Influenza A NOT DETECTED NOT DETECTED Final   Influenza B NOT DETECTED NOT DETECTED Final   Parainfluenza Virus 1 NOT DETECTED NOT DETECTED Final   Parainfluenza Virus 2 NOT DETECTED NOT DETECTED Final   Parainfluenza Virus 3 NOT DETECTED NOT DETECTED Final   Parainfluenza Virus 4 NOT DETECTED NOT DETECTED Final   Respiratory Syncytial Virus NOT DETECTED NOT DETECTED Final  Bordetella pertussis NOT DETECTED NOT DETECTED Final   Bordetella Parapertussis NOT DETECTED NOT DETECTED Final   Chlamydophila pneumoniae NOT DETECTED NOT DETECTED Final   Mycoplasma pneumoniae NOT DETECTED NOT DETECTED Final    Comment: Performed at [facility], [address], [place], [place] [address]    Labs: CBC: Recent Labs  Lab 06/06/23 1552  WBC 9.8  HGB 14.2  HCT 43.4  MCV 85.3  PLT 359   Basic Metabolic Panel: Recent Labs  Lab 06/06/23 1552 06/06/23 1758 06/07/23 0350 06/08/23 0432 06/09/23 0409  NA 139 138 140 137 136  K 3.9 3.8 3.5 4.0 4.3  CL 104 104 106 105 105  CO2 24 24 24 25 23   GLUCOSE 108* 107* 91 143* 157*  BUN 43* 43* 37* 35* 46*  CREATININE 2.57* 2.56* 2.09* 1.81* 1.75*  CALCIUM  8.7* 8.8* 8.6* 9.0 9.1  MG  --   --   --  2.2  --    Liver Function Tests: No results for input(s): AST, ALT, ALKPHOS, BILITOT, PROT, ALBUMIN  in the last 168 hours. CBG: Recent Labs  Lab 06/08/23 0741 06/08/23 1139 06/08/23 1649 06/08/23 2014 06/09/23 0748  GLUCAP  135* 157* 96 171* 155*    Discharge time spent: greater than 30 minutes.  Signed: Alm Schneider, MD [facility] 06/09/2023

## 2023-06-09 NOTE — Progress Notes (Signed)
 Mobility Specialist Progress Note:    06/09/23 1359  Mobility  Activity Ambulated with assistance in room;Stood at bedside  Level of Assistance Standby assist, set-up cues, supervision of patient - no hands on  Assistive Device None  Distance Ambulated (ft) 15 ft  Range of Motion/Exercises Active;All extremities  Activity Response Tolerated well  Mobility Referral Yes  Mobility visit 1 Mobility  Mobility Specialist Start Time (ACUTE ONLY) 1340  Mobility Specialist Stop Time (ACUTE ONLY) 1350  Mobility Specialist Time Calculation (min) (ACUTE ONLY) 10 min   Pt received attempting to ambulate with no assistance, chair alarm going off. Assisted pt with ambulation, required SBA with no AD. Tolerated well, asx throughout. Returned pt to chair, alarm on. Told pt to ask for assistance next time. Call bell in reach, all needs met.   Sherrilee Ditty Mobility Specialist Please contact via Special Educational Needs Teacher or  Rehab office at [phone]

## 2023-06-11 ENCOUNTER — Other Ambulatory Visit: Payer: Self-pay | Admitting: *Deleted

## 2023-06-11 ENCOUNTER — Telehealth: Payer: Self-pay | Admitting: Pharmacist

## 2023-06-11 ENCOUNTER — Encounter: Payer: Self-pay | Admitting: *Deleted

## 2023-06-11 ENCOUNTER — Telehealth: Payer: Self-pay | Admitting: *Deleted

## 2023-06-11 DIAGNOSIS — J441 Chronic obstructive pulmonary disease with (acute) exacerbation: Secondary | ICD-10-CM

## 2023-06-11 NOTE — Transitions of Care (Post Inpatient/ED Visit) (Signed)
 06/11/2023  Name: Ronald [name] MRN: [id number] DOB: 12/30/1945  Today's TOC FU Call Status: Today's TOC FU Call Status:: Successful TOC FU Call Completed TOC FU Call Complete Date: 06/11/23 Patient's Name and Date of Birth confirmed.  Transition Care Management Follow-up Telephone Call Date of Discharge: 06/09/23 [facility] Type of Discharge: Inpatient Admission Primary Inpatient Discharge Diagnosis:: Acute kidney injury superimposed on chronic kidney disease How have you been since you were released from the hospital?:  (pt does not always use 02 prn for dypsnea per CG, has been dyspneic w/ exertion this morning but hasn't used 02 (pt is applying 02 now and states this helps) pt eating well, ambulating well, reports using inhalers/ Nebs as prescribed) Any questions or concerns?: No  Items Reviewed: Did you receive and understand the discharge instructions provided?: Yes Medications obtained,verified, and reconciled?: Yes (Medications Reviewed) Any new allergies since your discharge?: No Dietary orders reviewed?: Yes Type of Diet Ordered:: low sodium,  heart healthy Do you have support at home?: Yes People in Home: friend(s) Name of Support/Comfort Primary Source: Clancy Mechanic Patient / CG agreeable to enrollment in 30 day program   Goals Addressed             This Visit's Progress    Transition of Care/ pt will have no readmissions within 30 days       Current Barriers:  Medication access pt unable to afford Eliquis  Home Health services [facility] has not contacted pt to schedule first home visit Care Coordination needs related to Limited education about smoking and effects on health, using oxygen  as prescribed- prn for dyspnea* Chronic Disease Management support and education needs related to COPD  Spoke with patient and caregiver [name] Gullickson who report pt is about the same since hospital discharge, has shortness of breath at  times but not using oxygen  like he should, continues to smoke but not as much, caregiver states pt does not have Eliquis  on hand, unable to afford, has been working with pharmacist at [facility], would like to know if there is any assistance.  RNCM Clinical Goal(s):  Patient will work with the Care Management team over the next 30 days to address Transition of Care Barriers: Medication access Home Health services verbalize understanding of plan for management of COPD as evidenced by patient/ CG report and  through collaboration with RN Care manager, provider, and care team.   Interventions: Evaluation of current treatment plan related to  self management and patient's adherence to plan as established by provider  COPD Interventions:  (Status:  New goal. and Goal on track:  Yes.) Short Term Goal Advised patient to track and manage COPD triggers Provided instruction about proper use of medications used for management of COPD including inhalers Advised patient to self assesses COPD action plan zone and make appointment with provider if in the yellow zone for 48 hours without improvement Provided education about and advised patient to utilize infection prevention strategies to reduce risk of respiratory infection Discussed the importance of adequate rest and management of fatigue with COPD Assessed social determinant of health barriers Telephone call to [facility], spoke with Darice who reports she does have orders and they will be contacting pt/ CG today to scheduled home visit for RN/ PT [facility] contact number [phone] given to caregiver Noami Gullickson, [name] verbalizes understanding that [facility] will contact her today to schedule home visit Referral placed for pharmacy for assistance with Eliquis  Reviewed using oxygen  exactly  as prescribed prn for dyspnea (pt has not been using 02 when has dyspnea) and do not smoke with oxygen , encouraged smoking cessation Reviewed all upcoming scheduled  appointments including cardiolgy 1/3, pulmonary 1/28, PCP 1/20, caregiver verbalizes understanding Collaboration with Rosina Forte RN care manager for [facility] informing TOC completed today and pt will be enrolled in 30 day program, will provide hand off at end of 30 days  Patient Goals/Self-Care Activities: Participate in Transition of Care Program/Attend [facility] scheduled calls Notify RN Care Manager of TOC call rescheduling needs Take all medications as prescribed Attend all scheduled provider appointments Call pharmacy for medication refills 3-7 days in advance of running out of medications Call provider office for new concerns or questions  eliminate smoking in my home listen for public air quality announcements every day do breathing exercises every day develop a rescue plan follow rescue plan if symptoms flare-up [facility] will contact you to schedule a home visit [phone] Referral placed for pharmacist for assistance/ any available resources for Eliquis  Use oxygen  exactly as prescribed, use oxygen  as needed if you have shortness of breath Please consider quitting smoking Use inhalers, nebulizer as prescribed- this will help you manage COPD  Follow Up Plan:  Telephone follow up appointment with care management team member scheduled for:  06/18/23 @ 9 am           Medications Reviewed Today: Medications Reviewed Today     Reviewed by Aura Mliss LABOR, RN (Registered Nurse) on 06/11/23 at 1127  Med List Status: <None>   Medication Order Taking? Sig Documenting Provider Last Dose Status Informant  acetaminophen  (TYLENOL ) 325 MG tablet [id number] Yes Take 2 tablets (650 mg total) by mouth every 6 (six) hours as needed for mild pain (or Fever >/= 101). Pearlean Manus, MD Taking Active Family Member  albuterol  (VENTOLIN  HFA) 108 [phone] Base) MCG/ACT inhaler [id number] Yes Inhale 2 puffs into the lungs every 6 (six) hours as needed for wheezing or shortness of breath. [name] LABOR, FNP Taking Active Family Member  ALPRAZolam  (XANAX ) 0.25 MG tablet [id number] Yes Take 1 tablet (0.25 mg total) by mouth 2 (two) times daily as needed for anxiety. [name] LABOR, FNP Taking Active Family Member  amiodarone  (PACERONE ) 200 MG tablet [id number] Yes Take 1 tablet by mouth once daily  Patient taking differently: Take 200 mg by mouth daily.   [name] LABOR, FNP Taking Active Family Member  amLODipine  (NORVASC ) 5 MG tablet [id number] Yes Take 1 tablet (5 mg total) by mouth daily. Evonnie Lenis, MD Taking Active   apixaban  (ELIQUIS ) 5 MG TABS tablet [id number] No Take 1 tablet (5 mg total) by mouth 2 (two) times daily.  Patient not taking: Reported on 06/11/2023   Tat, Lenis, MD Not Taking Active   benzonatate  (TESSALON ) 200 MG capsule [id number] Yes Take 1 capsule (200 mg total) by mouth 3 (three) times daily as needed. [name] LABOR, FNP Taking Active Family Member  bisoprolol  (ZEBETA ) 5 MG tablet [id number] Yes TAKE 1 TABLET BY MOUTH ONCE DAILY IN  PLACE  OF  METOPROLOL   Patient taking differently: Take 5 mg by mouth daily.   [name] LABOR, FNP Taking Active Family Member  Budeson-Glycopyrrol-Formoterol  (BREZTRI  AEROSPHERE) 160-9-4.8 MCG/ACT TERESE [id number] Yes Inhale 2 puffs into the lungs 2 (two) times daily. [name] LABOR, FNP Taking Active Family Member  budesonide  (PULMICORT ) 0.5 MG/2ML nebulizer solution [id number] Yes Take 2 mLs (0.5 mg total) by nebulization 2 (two) times daily. Pearlean Manus, MD  Taking Active Family Member  buPROPion  (WELLBUTRIN  XL) 150 MG 24 hr tablet [id number] Yes Take 1 tablet by mouth once daily Lavell Lye A, FNP Taking Active Family Member  busPIRone  (BUSPAR ) 7.5 MG tablet [id number] Yes Take 1 tablet (7.5 mg total) by mouth 3 (three) times daily as needed. Lavell Lye LABOR, FNP Taking Active Family Member  dapagliflozin  propanediol (FARXIGA ) 10 MG TABS tablet [id number] Yes Take 1 tablet (10 mg total) by mouth daily before breakfast.  Lavell Lye LABOR, FNP Taking Active Family Member  ipratropium-albuterol  (DUONEB) 0.5-2.5 (3) MG/3ML SOLN [id number] Yes Take 3 mLs by nebulization every 6 (six) hours as needed (asthma). Lavell Lye LABOR, FNP Taking Active Family Member  isosorbide  mononitrate (IMDUR ) 60 MG 24 hr tablet [id number] Yes Take 1 tablet (60 mg total) by mouth daily. Alvan Dorn FALCON, MD Taking Active Family Member  nitroGLYCERIN  (NITROSTAT ) 0.4 MG SL tablet [id number] Yes Place 1 tablet (0.4 mg total) under the tongue every 5 (five) minutes x 3 doses as needed for chest pain (if no relief after 2nd dose, proceed to ED or call 911). Alvan Dorn FALCON, MD Taking Active Family Member  omeprazole  (PRILOSEC ) 20 MG capsule [id number] Yes Take 1 capsule (20 mg total) by mouth daily. Lavell Lye LABOR, FNP Taking Active Family Member  OXYGEN  [id number] Yes Inhale 4 L into the lungs at bedtime as needed (shortness of breath). [provider] Taking Active Pharmacy Records, Other, Family Member, Self  polyethylene glycol (MIRALAX  / GLYCOLAX ) 17 g packet [id number] Yes Take 17 g by mouth daily.  Patient taking differently: Take 17 g by mouth daily as needed for mild constipation or moderate constipation.   Pearlean Manus, MD Taking Active Family Member  predniSONE  (DELTASONE ) 50 MG tablet [id number] Yes Take 1 tablet (50 mg total) by mouth daily with breakfast. Tat, Alm, MD Taking Active   rosuvastatin  (CRESTOR ) 20 MG tablet [id number] Yes Take 1 tablet (20 mg total) by mouth daily. Lavell Lye LABOR, FNP Taking Active Family Member            Home Care and Equipment/Supplies: Were Home Health Services Ordered?: Yes Name of Home Health Agency:: [facility] Has Agency set up a time to come to your home?: No EMR reviewed for Home Health Orders: Orders present/patient has not received call (refer to CM for follow-up) (contacted [facility], spoke w/ Darice who states they will contact pt/ CG today to scheduled home  visit RN/ PT) Any new equipment or medical supplies ordered?: No  Functional Questionnaire: Do you need assistance with bathing/showering or dressing?: Yes (CG assists) Do you need assistance with meal preparation?: Yes (CG assists) Do you need assistance with eating?: No Do you have difficulty maintaining continence: No Do you need assistance with getting out of bed/getting out of a chair/moving?: No Do you have difficulty managing or taking your medications?: Yes (CG assists and daughter is nurse and provides oversight)  Follow up appointments reviewed: PCP Follow-up appointment confirmed?: Yes Date of PCP follow-up appointment?: 06/29/23 Follow-up Provider: Lye Lavell FNP  @ 910 am Specialist Hospital Follow-up appointment confirmed?: Yes Date of Specialist follow-up appointment?: 06/12/23 Follow-Up Specialty Provider:: cardiology Almarie Crate  @ 330 pm     pulmonary Dr. Darlean  07/07/23  @ 215 pm Do you need transportation to your follow-up appointment?: No Do you understand care options if your condition(s) worsen?: Yes-patient verbalized understanding  SDOH Interventions Today    Flowsheet Row Most Recent Value  SDOH Interventions   Food  Insecurity Interventions Intervention Not Indicated  Housing Interventions Intervention Not Indicated  Transportation Interventions Intervention Not Indicated  Utilities Interventions Intervention Not Indicated       Mliss Creed [facility], BSN RN Care Manager/ Transition of Care [place]/ [facility] [phone]

## 2023-06-11 NOTE — Telephone Encounter (Signed)
   Call placed to patient to discuss Eliquis .  Patient does not qualify for Eliquis /BMS patient assistance (must spend 3% of annual income).  Clancy (ex-wife) is caring for patient and I discussed this info with her.  We do not currently have samples.  Instructed them to call cardiology to see if they may have samples.  We need to have a definitive plan moving forward as the cost of Eliquis  proves to be an ongoing issue ($147 copay per patient).  Will send message to cardiology and VBCI RN.  Informed family of upcoming PCP appt on 06/29/23.   Ronald [name] Dattero Shaylynn Nulty, PharmD, BCACP, CPP Clinical Pharmacist, [facility]

## 2023-06-12 ENCOUNTER — Ambulatory Visit: Payer: Medicare HMO | Admitting: Family

## 2023-06-12 ENCOUNTER — Encounter (INDEPENDENT_AMBULATORY_CARE_PROVIDER_SITE_OTHER): Payer: Self-pay | Admitting: *Deleted

## 2023-06-12 ENCOUNTER — Ambulatory Visit: Payer: Medicare HMO | Admitting: Nurse Practitioner

## 2023-06-15 ENCOUNTER — Telehealth: Payer: Self-pay

## 2023-06-15 NOTE — Telephone Encounter (Signed)
 Copied from CRM [id number]. Topic: Clinical - Home Health Verbal Orders >> Jun 15, 2023 12:18 PM Powell HERO wrote: Caller/Agency: [facility] Callback Number: [phone] Service Requested: N/A Frequency: Calling to advise they are starting him tomorrow on his orders that were sent over from the office Any new concerns about the patient? Yes

## 2023-06-16 ENCOUNTER — Telehealth: Payer: Self-pay | Admitting: Family Medicine

## 2023-06-16 ENCOUNTER — Encounter (HOSPITAL_COMMUNITY): Payer: Medicare HMO

## 2023-06-16 ENCOUNTER — Telehealth: Payer: Self-pay | Admitting: Nurse Practitioner

## 2023-06-16 NOTE — Telephone Encounter (Signed)
 Copied from CRM [id number]. Topic: Clinical - Home Health Verbal Orders >> Jun 16, 2023 10:20 AM Deleta HERO wrote: Caller/Agency: [facility] - Powell Sheldon (RN) Callback Number: [phone] Service Requested: N/A Frequency: Calling to advise they are starting him next week on his orders that were sent over from the office, advised she should expect a callback soon. Any new concerns about the patient? Yes

## 2023-06-16 NOTE — Telephone Encounter (Signed)
 FYI call from [facility] on [facility]

## 2023-06-16 NOTE — Telephone Encounter (Signed)
 Appears there is some noncompliance with this patient regarding his Eliquis . Please confirm to see if he is taking this. If not, he needs to understand his stroke risk increases if not on an oral anticoagulant. If cost is going to be an issue, will strongly need to advise him on Coumadin.  Let me know what he says.  Thanks!  Best, Almarie Crate, NP        Called patient unable to reach. Left voicemail to call back.

## 2023-06-18 ENCOUNTER — Other Ambulatory Visit: Payer: Self-pay | Admitting: *Deleted

## 2023-06-18 ENCOUNTER — Telehealth: Payer: Self-pay | Admitting: *Deleted

## 2023-06-18 NOTE — Patient Outreach (Signed)
  Care Management  Transitions of Care Program Transitions of Care Post-discharge week 2  06/18/2023 Name: [name] MRN: [id number] DOB: 1945/08/26  Subjective: Ronald [name] is a 78 y.o. year old male who is a primary care patient of [name] LABOR, FNP. The Care Management team was unable to reach the patient by phone to assess and address transitions of care needs.   Plan: Additional outreach attempts will be made to reach the patient enrolled in the [facility] Program (Post Inpatient/ED Visit).  Mliss Creed [facility], BSN RN Care Manager/ Transition of Care [place]/ [facility] [phone]

## 2023-06-19 ENCOUNTER — Telehealth: Payer: Self-pay | Admitting: *Deleted

## 2023-06-19 NOTE — Patient Outreach (Signed)
  Care Management  Transitions of Care Program Transitions of Care Post-discharge week 2  06/19/2023 Name: Ronald [name] MRN: [id number] DOB: 22-Mar-1946  Subjective: CAETANO OBERHAUS is a 78 y.o. year old male who is a primary care patient of [name] LABOR, FNP. The Care Management team was unable to reach the patient by phone to assess and address transitions of care needs, 2nd attempt.  Plan: Additional outreach attempts will be made to reach the patient enrolled in the [facility] Program (Post Inpatient/ED Visit).  Mliss Creed [facility], BSN RN Care Manager/ Transition of Care / [facility] [phone]

## 2023-06-22 ENCOUNTER — Telehealth: Payer: Self-pay | Admitting: *Deleted

## 2023-06-22 ENCOUNTER — Encounter: Payer: Self-pay | Admitting: Nurse Practitioner

## 2023-06-22 DIAGNOSIS — M25562 Pain in left knee: Secondary | ICD-10-CM | POA: Diagnosis not present

## 2023-06-22 DIAGNOSIS — K7689 Other specified diseases of liver: Secondary | ICD-10-CM | POA: Diagnosis not present

## 2023-06-22 DIAGNOSIS — M1812 Unilateral primary osteoarthritis of first carpometacarpal joint, left hand: Secondary | ICD-10-CM | POA: Diagnosis not present

## 2023-06-22 DIAGNOSIS — S61412A Laceration without foreign body of left hand, initial encounter: Secondary | ICD-10-CM | POA: Diagnosis not present

## 2023-06-22 DIAGNOSIS — R918 Other nonspecific abnormal finding of lung field: Secondary | ICD-10-CM | POA: Diagnosis not present

## 2023-06-22 DIAGNOSIS — S2222XA Fracture of body of sternum, initial encounter for closed fracture: Secondary | ICD-10-CM | POA: Diagnosis not present

## 2023-06-22 DIAGNOSIS — S52202A Unspecified fracture of shaft of left ulna, initial encounter for closed fracture: Secondary | ICD-10-CM | POA: Diagnosis not present

## 2023-06-22 DIAGNOSIS — M25552 Pain in left hip: Secondary | ICD-10-CM | POA: Diagnosis not present

## 2023-06-22 DIAGNOSIS — S62327A Displaced fracture of shaft of fifth metacarpal bone, left hand, initial encounter for closed fracture: Secondary | ICD-10-CM | POA: Diagnosis not present

## 2023-06-22 DIAGNOSIS — S0003XA Contusion of scalp, initial encounter: Secondary | ICD-10-CM | POA: Diagnosis not present

## 2023-06-22 DIAGNOSIS — S3993XA Unspecified injury of pelvis, initial encounter: Secondary | ICD-10-CM | POA: Diagnosis not present

## 2023-06-22 DIAGNOSIS — S52572A Other intraarticular fracture of lower end of left radius, initial encounter for closed fracture: Secondary | ICD-10-CM | POA: Diagnosis not present

## 2023-06-22 DIAGNOSIS — I10 Essential (primary) hypertension: Secondary | ICD-10-CM | POA: Diagnosis not present

## 2023-06-22 DIAGNOSIS — R062 Wheezing: Secondary | ICD-10-CM | POA: Diagnosis not present

## 2023-06-22 DIAGNOSIS — Z794 Long term (current) use of insulin: Secondary | ICD-10-CM | POA: Diagnosis not present

## 2023-06-22 DIAGNOSIS — M79605 Pain in left leg: Secondary | ICD-10-CM | POA: Diagnosis not present

## 2023-06-22 DIAGNOSIS — M25462 Effusion, left knee: Secondary | ICD-10-CM | POA: Diagnosis not present

## 2023-06-22 DIAGNOSIS — S0990XA Unspecified injury of head, initial encounter: Secondary | ICD-10-CM | POA: Diagnosis not present

## 2023-06-22 DIAGNOSIS — Z041 Encounter for examination and observation following transport accident: Secondary | ICD-10-CM | POA: Diagnosis not present

## 2023-06-22 DIAGNOSIS — S3991XA Unspecified injury of abdomen, initial encounter: Secondary | ICD-10-CM | POA: Diagnosis not present

## 2023-06-22 DIAGNOSIS — J439 Emphysema, unspecified: Secondary | ICD-10-CM | POA: Diagnosis not present

## 2023-06-22 DIAGNOSIS — J449 Chronic obstructive pulmonary disease, unspecified: Secondary | ICD-10-CM | POA: Diagnosis not present

## 2023-06-22 DIAGNOSIS — M25532 Pain in left wrist: Secondary | ICD-10-CM | POA: Diagnosis not present

## 2023-06-22 DIAGNOSIS — S2242XA Multiple fractures of ribs, left side, initial encounter for closed fracture: Secondary | ICD-10-CM | POA: Diagnosis not present

## 2023-06-22 DIAGNOSIS — S52602A Unspecified fracture of lower end of left ulna, initial encounter for closed fracture: Secondary | ICD-10-CM | POA: Diagnosis not present

## 2023-06-22 DIAGNOSIS — E785 Hyperlipidemia, unspecified: Secondary | ICD-10-CM | POA: Diagnosis not present

## 2023-06-22 DIAGNOSIS — Z7902 Long term (current) use of antithrombotics/antiplatelets: Secondary | ICD-10-CM | POA: Diagnosis not present

## 2023-06-22 DIAGNOSIS — M542 Cervicalgia: Secondary | ICD-10-CM | POA: Diagnosis not present

## 2023-06-22 DIAGNOSIS — S62337A Displaced fracture of neck of fifth metacarpal bone, left hand, initial encounter for closed fracture: Secondary | ICD-10-CM | POA: Diagnosis not present

## 2023-06-22 DIAGNOSIS — F17210 Nicotine dependence, cigarettes, uncomplicated: Secondary | ICD-10-CM | POA: Diagnosis not present

## 2023-06-22 DIAGNOSIS — S52502A Unspecified fracture of the lower end of left radius, initial encounter for closed fracture: Secondary | ICD-10-CM | POA: Diagnosis not present

## 2023-06-22 DIAGNOSIS — S62307A Unspecified fracture of fifth metacarpal bone, left hand, initial encounter for closed fracture: Secondary | ICD-10-CM | POA: Diagnosis not present

## 2023-06-22 DIAGNOSIS — S8992XA Unspecified injury of left lower leg, initial encounter: Secondary | ICD-10-CM | POA: Diagnosis not present

## 2023-06-22 DIAGNOSIS — K449 Diaphragmatic hernia without obstruction or gangrene: Secondary | ICD-10-CM | POA: Diagnosis not present

## 2023-06-22 NOTE — Telephone Encounter (Signed)
 Letter mailed to patient.

## 2023-06-22 NOTE — Telephone Encounter (Signed)
 All numbers listed to talk with patient or provided [facility] emergency contact phone numbers are invalid. unable to reach patient by phone

## 2023-06-22 NOTE — Patient Outreach (Signed)
  Care Management  Transitions of Care Program Transitions of Care Post-discharge week 2  06/22/2023 Name: Ronald [name] MRN: [id number] DOB: 12-Mar-1946  Subjective: [name] is a 78 y.o. year old male who is a primary care patient of [name] LABOR, FNP. The Care Management team was unable to reach the patient by phone to assess and address transitions of care needs. Third attempt outreach, case closed.  Plan: No further outreach attempts will be made at this time.  We have been unable to reach the patient.  Mliss Creed [facility], BSN RN Care Manager/ Transition of Care [place]/ [facility] [phone]

## 2023-06-23 ENCOUNTER — Telehealth: Payer: Self-pay | Admitting: Family

## 2023-06-23 NOTE — Telephone Encounter (Signed)
 Spoke with Maleesa from [facility] agency. Requesting order for pt to have Home health. Needs to be sent to whichever agency we can get him in with. Believes pt has had [facility] in the past.    Copied from CRM [id number]. Topic: Clinical - Home Health Verbal Orders >> Jun 23, 2023 11:30 AM Tonda NOVAK wrote: Caller/Agency: home health  Callback Number: [phone] Service Requested: Occupational Therapy Frequency:  unknown Any new concerns about the patient? Yes

## 2023-06-23 NOTE — Telephone Encounter (Signed)
Appt 1/20 °

## 2023-06-23 NOTE — Telephone Encounter (Signed)
 Please schedule patient visit for home health orders.

## 2023-06-25 ENCOUNTER — Other Ambulatory Visit: Payer: Self-pay | Admitting: Family

## 2023-06-25 ENCOUNTER — Telehealth: Payer: Self-pay

## 2023-06-25 DIAGNOSIS — J439 Emphysema, unspecified: Secondary | ICD-10-CM
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
## 2023-06-26 NOTE — Telephone Encounter (Signed)
There is no home Ortho. If he needs to see an Ortho he will need to go to their office.

## 2023-06-26 NOTE — Telephone Encounter (Signed)
Sister is not on HIPAA to speak with

## 2023-06-26 NOTE — Telephone Encounter (Signed)
 NA

## 2023-06-29 ENCOUNTER — Ambulatory Visit: Payer: Medicare HMO | Admitting: Family

## 2023-06-29 ENCOUNTER — Encounter: Payer: Self-pay | Admitting: Family

## 2023-06-30 DIAGNOSIS — S83002A Unspecified subluxation of left patella, initial encounter: Secondary | ICD-10-CM | POA: Diagnosis not present

## 2023-06-30 DIAGNOSIS — M25532 Pain in left wrist: Secondary | ICD-10-CM | POA: Diagnosis not present

## 2023-06-30 DIAGNOSIS — M25561 Pain in right knee: Secondary | ICD-10-CM | POA: Diagnosis not present

## 2023-06-30 DIAGNOSIS — S62327A Displaced fracture of shaft of fifth metacarpal bone, left hand, initial encounter for closed fracture: Secondary | ICD-10-CM | POA: Diagnosis not present

## 2023-06-30 DIAGNOSIS — S52602A Unspecified fracture of lower end of left ulna, initial encounter for closed fracture: Secondary | ICD-10-CM | POA: Diagnosis not present

## 2023-06-30 DIAGNOSIS — M25562 Pain in left knee: Secondary | ICD-10-CM | POA: Diagnosis not present

## 2023-06-30 DIAGNOSIS — S52572A Other intraarticular fracture of lower end of left radius, initial encounter for closed fracture: Secondary | ICD-10-CM | POA: Diagnosis not present

## 2023-06-30 DIAGNOSIS — S82292A Other fracture of shaft of left tibia, initial encounter for closed fracture: Secondary | ICD-10-CM | POA: Diagnosis not present

## 2023-06-30 NOTE — Telephone Encounter (Signed)
Patient no showed for last appt

## 2023-07-03 DIAGNOSIS — R442 Other hallucinations: Secondary | ICD-10-CM | POA: Diagnosis not present

## 2023-07-03 DIAGNOSIS — Z743 Need for continuous supervision: Secondary | ICD-10-CM | POA: Diagnosis not present

## 2023-07-03 DIAGNOSIS — I10 Essential (primary) hypertension: Secondary | ICD-10-CM | POA: Diagnosis not present

## 2023-07-04 ENCOUNTER — Encounter (HOSPITAL_COMMUNITY): Payer: Self-pay

## 2023-07-04 DIAGNOSIS — R7989 Other specified abnormal findings of blood chemistry: Secondary | ICD-10-CM | POA: Diagnosis not present

## 2023-07-04 DIAGNOSIS — R4182 Altered mental status, unspecified: Secondary | ICD-10-CM | POA: Diagnosis not present

## 2023-07-04 DIAGNOSIS — I672 Cerebral atherosclerosis: Secondary | ICD-10-CM | POA: Diagnosis not present

## 2023-07-04 DIAGNOSIS — R918 Other nonspecific abnormal finding of lung field: Secondary | ICD-10-CM | POA: Diagnosis not present

## 2023-07-04 DIAGNOSIS — M25462 Effusion, left knee: Secondary | ICD-10-CM | POA: Diagnosis not present

## 2023-07-04 DIAGNOSIS — F05 Delirium due to known physiological condition: Secondary | ICD-10-CM | POA: Diagnosis not present

## 2023-07-04 DIAGNOSIS — S82142A Displaced bicondylar fracture of left tibia, initial encounter for closed fracture: Secondary | ICD-10-CM | POA: Diagnosis not present

## 2023-07-04 DIAGNOSIS — I509 Heart failure, unspecified: Secondary | ICD-10-CM | POA: Diagnosis not present

## 2023-07-04 DIAGNOSIS — I700 Atherosclerosis of aorta: Secondary | ICD-10-CM | POA: Diagnosis not present

## 2023-07-04 DIAGNOSIS — J439 Emphysema, unspecified: Secondary | ICD-10-CM | POA: Diagnosis not present

## 2023-07-05 ENCOUNTER — Inpatient Hospital Stay (HOSPITAL_COMMUNITY): Payer: Medicare HMO

## 2023-07-05 ENCOUNTER — Inpatient Hospital Stay (HOSPITAL_COMMUNITY)
Admission: EM | Admit: 2023-07-05 | Discharge: 2023-07-15 | DRG: 092 | Disposition: A | Discharge status: Skilled Nursing Facility | Payer: Medicare HMO | Source: Other Acute Inpatient Hospital | Attending: Student | Admitting: Student

## 2023-07-05 ENCOUNTER — Encounter (HOSPITAL_COMMUNITY): Payer: Self-pay | Admitting: Internal Medicine

## 2023-07-05 ENCOUNTER — Observation Stay (HOSPITAL_COMMUNITY): Payer: Medicare HMO

## 2023-07-05 ENCOUNTER — Other Ambulatory Visit: Payer: Self-pay

## 2023-07-05 DIAGNOSIS — I2489 Other forms of acute ischemic heart disease: Secondary | ICD-10-CM | POA: Diagnosis not present

## 2023-07-05 DIAGNOSIS — Z9981 Dependence on supplemental oxygen: Secondary | ICD-10-CM | POA: Diagnosis not present

## 2023-07-05 DIAGNOSIS — G8929 Other chronic pain: Secondary | ICD-10-CM | POA: Diagnosis present

## 2023-07-05 DIAGNOSIS — Z7951 Long term (current) use of inhaled steroids: Secondary | ICD-10-CM

## 2023-07-05 DIAGNOSIS — E441 Mild protein-calorie malnutrition: Secondary | ICD-10-CM | POA: Diagnosis not present

## 2023-07-05 DIAGNOSIS — I5032 Chronic diastolic (congestive) heart failure: Secondary | ICD-10-CM | POA: Diagnosis present

## 2023-07-05 DIAGNOSIS — E119 Type 2 diabetes mellitus without complications: Secondary | ICD-10-CM

## 2023-07-05 DIAGNOSIS — S5292XA Unspecified fracture of left forearm, initial encounter for closed fracture: Secondary | ICD-10-CM

## 2023-07-05 DIAGNOSIS — G929 Unspecified toxic encephalopathy: Secondary | ICD-10-CM | POA: Diagnosis not present

## 2023-07-05 DIAGNOSIS — E876 Hypokalemia: Secondary | ICD-10-CM | POA: Diagnosis present

## 2023-07-05 DIAGNOSIS — K5190 Ulcerative colitis, unspecified, without complications: Secondary | ICD-10-CM | POA: Diagnosis not present

## 2023-07-05 DIAGNOSIS — J189 Pneumonia, unspecified organism: Secondary | ICD-10-CM | POA: Diagnosis not present

## 2023-07-05 DIAGNOSIS — Z9049 Acquired absence of other specified parts of digestive tract: Secondary | ICD-10-CM

## 2023-07-05 DIAGNOSIS — Y92009 Unspecified place in unspecified non-institutional (private) residence as the place of occurrence of the external cause: Secondary | ICD-10-CM

## 2023-07-05 DIAGNOSIS — F419 Anxiety disorder, unspecified: Secondary | ICD-10-CM | POA: Diagnosis not present

## 2023-07-05 DIAGNOSIS — S82112A Displaced fracture of left tibial spine, initial encounter for closed fracture: Secondary | ICD-10-CM | POA: Diagnosis not present

## 2023-07-05 DIAGNOSIS — E785 Hyperlipidemia, unspecified: Secondary | ICD-10-CM | POA: Diagnosis not present

## 2023-07-05 DIAGNOSIS — Y92410 Unspecified street and highway as the place of occurrence of the external cause: Secondary | ICD-10-CM | POA: Diagnosis not present

## 2023-07-05 DIAGNOSIS — Z23 Encounter for immunization: Secondary | ICD-10-CM | POA: Diagnosis not present

## 2023-07-05 DIAGNOSIS — I10 Essential (primary) hypertension: Secondary | ICD-10-CM | POA: Diagnosis not present

## 2023-07-05 DIAGNOSIS — R41841 Cognitive communication deficit: Secondary | ICD-10-CM | POA: Diagnosis not present

## 2023-07-05 DIAGNOSIS — Z806 Family history of leukemia: Secondary | ICD-10-CM

## 2023-07-05 DIAGNOSIS — G928 Other toxic encephalopathy: Principal | ICD-10-CM | POA: Diagnosis present

## 2023-07-05 DIAGNOSIS — J449 Chronic obstructive pulmonary disease, unspecified: Secondary | ICD-10-CM | POA: Diagnosis not present

## 2023-07-05 DIAGNOSIS — I509 Heart failure, unspecified: Secondary | ICD-10-CM | POA: Diagnosis not present

## 2023-07-05 DIAGNOSIS — J9621 Acute and chronic respiratory failure with hypoxia: Secondary | ICD-10-CM | POA: Diagnosis not present

## 2023-07-05 DIAGNOSIS — M7989 Other specified soft tissue disorders: Secondary | ICD-10-CM | POA: Diagnosis not present

## 2023-07-05 DIAGNOSIS — I2510 Atherosclerotic heart disease of native coronary artery without angina pectoris: Secondary | ICD-10-CM | POA: Diagnosis not present

## 2023-07-05 DIAGNOSIS — T07XXXA Unspecified multiple injuries, initial encounter: Secondary | ICD-10-CM | POA: Diagnosis not present

## 2023-07-05 DIAGNOSIS — Z7952 Long term (current) use of systemic steroids: Secondary | ICD-10-CM

## 2023-07-05 DIAGNOSIS — S52601D Unspecified fracture of lower end of right ulna, subsequent encounter for closed fracture with routine healing: Secondary | ICD-10-CM | POA: Diagnosis not present

## 2023-07-05 DIAGNOSIS — E1129 Type 2 diabetes mellitus with other diabetic kidney complication: Secondary | ICD-10-CM | POA: Diagnosis not present

## 2023-07-05 DIAGNOSIS — R5381 Other malaise: Secondary | ICD-10-CM | POA: Diagnosis present

## 2023-07-05 DIAGNOSIS — I480 Paroxysmal atrial fibrillation: Secondary | ICD-10-CM | POA: Diagnosis not present

## 2023-07-05 DIAGNOSIS — R0602 Shortness of breath: Secondary | ICD-10-CM | POA: Diagnosis not present

## 2023-07-05 DIAGNOSIS — J9690 Respiratory failure, unspecified, unspecified whether with hypoxia or hypercapnia: Secondary | ICD-10-CM | POA: Diagnosis not present

## 2023-07-05 DIAGNOSIS — S52502A Unspecified fracture of the lower end of left radius, initial encounter for closed fracture: Secondary | ICD-10-CM | POA: Diagnosis present

## 2023-07-05 DIAGNOSIS — J4489 Other specified chronic obstructive pulmonary disease: Secondary | ICD-10-CM | POA: Diagnosis not present

## 2023-07-05 DIAGNOSIS — E782 Mixed hyperlipidemia: Secondary | ICD-10-CM | POA: Diagnosis not present

## 2023-07-05 DIAGNOSIS — N1832 Chronic kidney disease, stage 3b: Secondary | ICD-10-CM | POA: Insufficient documentation

## 2023-07-05 DIAGNOSIS — R262 Difficulty in walking, not elsewhere classified: Secondary | ICD-10-CM | POA: Diagnosis not present

## 2023-07-05 DIAGNOSIS — Z823 Family history of stroke: Secondary | ICD-10-CM

## 2023-07-05 DIAGNOSIS — Z79899 Other long term (current) drug therapy: Secondary | ICD-10-CM

## 2023-07-05 DIAGNOSIS — J441 Chronic obstructive pulmonary disease with (acute) exacerbation: Secondary | ICD-10-CM | POA: Diagnosis not present

## 2023-07-05 DIAGNOSIS — I2699 Other pulmonary embolism without acute cor pulmonale: Secondary | ICD-10-CM | POA: Diagnosis not present

## 2023-07-05 DIAGNOSIS — I5A Non-ischemic myocardial injury (non-traumatic): Secondary | ICD-10-CM

## 2023-07-05 DIAGNOSIS — I130 Hypertensive heart and chronic kidney disease with heart failure and stage 1 through stage 4 chronic kidney disease, or unspecified chronic kidney disease: Secondary | ICD-10-CM | POA: Diagnosis present

## 2023-07-05 DIAGNOSIS — S62307A Unspecified fracture of fifth metacarpal bone, left hand, initial encounter for closed fracture: Secondary | ICD-10-CM | POA: Diagnosis present

## 2023-07-05 DIAGNOSIS — E7800 Pure hypercholesterolemia, unspecified: Secondary | ICD-10-CM | POA: Diagnosis not present

## 2023-07-05 DIAGNOSIS — J9611 Chronic respiratory failure with hypoxia: Secondary | ICD-10-CM | POA: Diagnosis present

## 2023-07-05 DIAGNOSIS — G9341 Metabolic encephalopathy: Principal | ICD-10-CM | POA: Diagnosis present

## 2023-07-05 DIAGNOSIS — Z801 Family history of malignant neoplasm of trachea, bronchus and lung: Secondary | ICD-10-CM

## 2023-07-05 DIAGNOSIS — D649 Anemia, unspecified: Secondary | ICD-10-CM | POA: Diagnosis not present

## 2023-07-05 DIAGNOSIS — E1122 Type 2 diabetes mellitus with diabetic chronic kidney disease: Secondary | ICD-10-CM | POA: Diagnosis present

## 2023-07-05 DIAGNOSIS — F17210 Nicotine dependence, cigarettes, uncomplicated: Secondary | ICD-10-CM | POA: Diagnosis present

## 2023-07-05 DIAGNOSIS — I5030 Unspecified diastolic (congestive) heart failure: Secondary | ICD-10-CM | POA: Diagnosis not present

## 2023-07-05 DIAGNOSIS — Z955 Presence of coronary angioplasty implant and graft: Secondary | ICD-10-CM

## 2023-07-05 DIAGNOSIS — Z743 Need for continuous supervision: Secondary | ICD-10-CM | POA: Diagnosis not present

## 2023-07-05 DIAGNOSIS — R918 Other nonspecific abnormal finding of lung field: Secondary | ICD-10-CM | POA: Diagnosis not present

## 2023-07-05 DIAGNOSIS — S52202A Unspecified fracture of shaft of left ulna, initial encounter for closed fracture: Secondary | ICD-10-CM | POA: Diagnosis not present

## 2023-07-05 DIAGNOSIS — Z8673 Personal history of transient ischemic attack (TIA), and cerebral infarction without residual deficits: Secondary | ICD-10-CM

## 2023-07-05 DIAGNOSIS — Z888 Allergy status to other drugs, medicaments and biological substances status: Secondary | ICD-10-CM

## 2023-07-05 DIAGNOSIS — R4182 Altered mental status, unspecified: Secondary | ICD-10-CM | POA: Diagnosis not present

## 2023-07-05 DIAGNOSIS — I5033 Acute on chronic diastolic (congestive) heart failure: Secondary | ICD-10-CM | POA: Diagnosis not present

## 2023-07-05 DIAGNOSIS — F339 Major depressive disorder, recurrent, unspecified: Secondary | ICD-10-CM | POA: Diagnosis not present

## 2023-07-05 DIAGNOSIS — R9431 Abnormal electrocardiogram [ECG] [EKG]: Secondary | ICD-10-CM | POA: Diagnosis not present

## 2023-07-05 DIAGNOSIS — T40605A Adverse effect of unspecified narcotics, initial encounter: Secondary | ICD-10-CM | POA: Diagnosis present

## 2023-07-05 DIAGNOSIS — I4892 Unspecified atrial flutter: Secondary | ICD-10-CM | POA: Diagnosis not present

## 2023-07-05 DIAGNOSIS — J9600 Acute respiratory failure, unspecified whether with hypoxia or hypercapnia: Secondary | ICD-10-CM | POA: Diagnosis not present

## 2023-07-05 DIAGNOSIS — T424X5A Adverse effect of benzodiazepines, initial encounter: Secondary | ICD-10-CM | POA: Diagnosis present

## 2023-07-05 DIAGNOSIS — Z7901 Long term (current) use of anticoagulants: Secondary | ICD-10-CM | POA: Diagnosis not present

## 2023-07-05 DIAGNOSIS — R319 Hematuria, unspecified: Secondary | ICD-10-CM

## 2023-07-05 DIAGNOSIS — K219 Gastro-esophageal reflux disease without esophagitis: Secondary | ICD-10-CM | POA: Diagnosis not present

## 2023-07-05 DIAGNOSIS — S82142A Displaced bicondylar fracture of left tibia, initial encounter for closed fracture: Secondary | ICD-10-CM

## 2023-07-05 DIAGNOSIS — N189 Chronic kidney disease, unspecified: Secondary | ICD-10-CM | POA: Insufficient documentation

## 2023-07-05 DIAGNOSIS — S6292XA Unspecified fracture of left wrist and hand, initial encounter for closed fracture: Secondary | ICD-10-CM | POA: Diagnosis not present

## 2023-07-05 DIAGNOSIS — S52592D Other fractures of lower end of left radius, subsequent encounter for closed fracture with routine healing: Secondary | ICD-10-CM | POA: Diagnosis not present

## 2023-07-05 DIAGNOSIS — E8809 Other disorders of plasma-protein metabolism, not elsewhere classified: Secondary | ICD-10-CM | POA: Diagnosis not present

## 2023-07-05 DIAGNOSIS — I700 Atherosclerosis of aorta: Secondary | ICD-10-CM | POA: Diagnosis not present

## 2023-07-05 DIAGNOSIS — G4730 Sleep apnea, unspecified: Secondary | ICD-10-CM | POA: Diagnosis not present

## 2023-07-05 DIAGNOSIS — I252 Old myocardial infarction: Secondary | ICD-10-CM

## 2023-07-05 DIAGNOSIS — R1310 Dysphagia, unspecified: Secondary | ICD-10-CM | POA: Diagnosis not present

## 2023-07-05 DIAGNOSIS — Z8249 Family history of ischemic heart disease and other diseases of the circulatory system: Secondary | ICD-10-CM

## 2023-07-05 DIAGNOSIS — I959 Hypotension, unspecified: Secondary | ICD-10-CM | POA: Diagnosis not present

## 2023-07-05 DIAGNOSIS — R748 Abnormal levels of other serum enzymes: Secondary | ICD-10-CM | POA: Diagnosis not present

## 2023-07-05 DIAGNOSIS — J45909 Unspecified asthma, uncomplicated: Secondary | ICD-10-CM | POA: Diagnosis not present

## 2023-07-05 DIAGNOSIS — N179 Acute kidney failure, unspecified: Secondary | ICD-10-CM | POA: Diagnosis not present

## 2023-07-05 DIAGNOSIS — Z7401 Bed confinement status: Secondary | ICD-10-CM | POA: Diagnosis not present

## 2023-07-05 DIAGNOSIS — M6281 Muscle weakness (generalized): Secondary | ICD-10-CM | POA: Diagnosis not present

## 2023-07-05 DIAGNOSIS — Z825 Family history of asthma and other chronic lower respiratory diseases: Secondary | ICD-10-CM

## 2023-07-05 DIAGNOSIS — G9340 Encephalopathy, unspecified: Secondary | ICD-10-CM | POA: Diagnosis not present

## 2023-07-05 DIAGNOSIS — S82112D Displaced fracture of left tibial spine, subsequent encounter for closed fracture with routine healing: Secondary | ICD-10-CM | POA: Diagnosis not present

## 2023-07-05 DIAGNOSIS — R1312 Dysphagia, oropharyngeal phase: Secondary | ICD-10-CM | POA: Diagnosis not present

## 2023-07-05 LAB — CBC
HCT: 37.5 % — ABNORMAL LOW (ref 39.0–52.0)
Hemoglobin: 12.6 g/dL — ABNORMAL LOW (ref 13.0–17.0)
MCH: 27.8 pg (ref 26.0–34.0)
MCHC: 33.6 g/dL (ref 30.0–36.0)
MCV: 82.8 fL (ref 80.0–100.0)
Platelets: 445 10*3/uL — ABNORMAL HIGH (ref 150–400)
RBC: 4.53 MIL/uL (ref 4.22–5.81)
RDW: 15.9 % — ABNORMAL HIGH (ref 11.5–15.5)
WBC: 7.3 10*3/uL (ref 4.0–10.5)
nRBC: 0 % (ref 0.0–0.2)

## 2023-07-05 LAB — BRAIN NATRIURETIC PEPTIDE: B Natriuretic Peptide: 727.6 pg/mL — ABNORMAL HIGH (ref 0.0–100.0)

## 2023-07-05 LAB — CK: Total CK: 32 U/L — ABNORMAL LOW (ref 49–397)

## 2023-07-05 LAB — COMPREHENSIVE METABOLIC PANEL
ALT: 17 U/L (ref 0–44)
AST: 15 U/L (ref 15–41)
Albumin: 2.5 g/dL — ABNORMAL LOW (ref 3.5–5.0)
Alkaline Phosphatase: 113 U/L (ref 38–126)
Anion gap: 7 (ref 5–15)
BUN: 20 mg/dL (ref 8–23)
CO2: 28 mmol/L (ref 22–32)
Calcium: 8.4 mg/dL — ABNORMAL LOW (ref 8.9–10.3)
Chloride: 105 mmol/L (ref 98–111)
Creatinine, Ser: 1.57 mg/dL — ABNORMAL HIGH (ref 0.61–1.24)
GFR, Estimated: 46 mL/min — ABNORMAL LOW (ref >60)
Glucose, Bld: 119 mg/dL — ABNORMAL HIGH (ref 70–99)
Potassium: 3.2 mmol/L — ABNORMAL LOW (ref 3.5–5.1)
Sodium: 140 mmol/L (ref 135–145)
Total Bilirubin: 1.1 mg/dL (ref 0.0–1.2)
Total Protein: 5.5 g/dL — ABNORMAL LOW (ref 6.5–8.1)

## 2023-07-05 LAB — GLUCOSE, CAPILLARY
Glucose-Capillary: 124 mg/dL — ABNORMAL HIGH (ref 70–99)
Glucose-Capillary: 141 mg/dL — ABNORMAL HIGH (ref 70–99)

## 2023-07-05 LAB — GAMMA GT: GGT: 18 U/L (ref 7–50)

## 2023-07-05 LAB — MAGNESIUM: Magnesium: 1.9 mg/dL (ref 1.7–2.4)

## 2023-07-05 LAB — AMMONIA: Ammonia: 14 umol/L (ref 9–35)

## 2023-07-05 LAB — VITAMIN B12: Vitamin B-12: 344 pg/mL (ref 180–914)

## 2023-07-05 MED ORDER — INSULIN ASPART 100 UNIT/ML IJ SOLN
0.0000 [IU] | Freq: Three times a day (TID) | INTRAMUSCULAR | Status: DC
  Administered 2023-07-06: 2 [IU] via SUBCUTANEOUS
  Administered 2023-07-07: 1 [IU] via SUBCUTANEOUS
  Administered 2023-07-07: 2 [IU] via SUBCUTANEOUS
  Administered 2023-07-07 – 2023-07-08 (×2): 1 [IU] via SUBCUTANEOUS
  Administered 2023-07-08: 3 [IU] via SUBCUTANEOUS
  Administered 2023-07-09: 1 [IU] via SUBCUTANEOUS
  Administered 2023-07-09: 3 [IU] via SUBCUTANEOUS
  Administered 2023-07-09: 2 [IU] via SUBCUTANEOUS
  Administered 2023-07-10: 1 [IU] via SUBCUTANEOUS
  Administered 2023-07-10 – 2023-07-11 (×2): 2 [IU] via SUBCUTANEOUS
  Administered 2023-07-11 – 2023-07-13 (×5): 1 [IU] via SUBCUTANEOUS
  Administered 2023-07-13: 3 [IU] via SUBCUTANEOUS
  Administered 2023-07-14: 1 [IU] via SUBCUTANEOUS

## 2023-07-05 MED ORDER — TRIAMCINOLONE ACETONIDE 55 MCG/ACT NA AERO
1.0000 | INHALATION_SPRAY | Freq: Every day | NASAL | Status: DC
  Administered 2023-07-05 – 2023-07-13 (×9): 1 via NASAL
  Filled 2023-07-05: qty 10.8

## 2023-07-05 MED ORDER — THIAMINE MONONITRATE 100 MG PO TABS
100.0000 mg | ORAL_TABLET | Freq: Every day | ORAL | Status: DC
  Administered 2023-07-05 – 2023-07-15 (×11): 100 mg via ORAL
  Filled 2023-07-05 (×11): qty 1

## 2023-07-05 MED ORDER — FOLIC ACID 1 MG PO TABS
1.0000 mg | ORAL_TABLET | Freq: Every day | ORAL | Status: DC
Start: 2023-07-05 — End: 2023-07-15
  Administered 2023-07-05 – 2023-07-15 (×11): 1 mg via ORAL
  Filled 2023-07-05 (×11): qty 1

## 2023-07-05 MED ORDER — BUDESON-GLYCOPYRROL-FORMOTEROL 160-9-4.8 MCG/ACT IN AERO: 2.0000 | INHALATION_SPRAY | Freq: Two times a day (BID) | RESPIRATORY_TRACT | Status: DC

## 2023-07-05 MED ORDER — LORAZEPAM 1 MG PO TABS
1.0000 mg | ORAL_TABLET | ORAL | Status: DC | PRN
Start: 2023-07-05 — End: 2023-07-08
  Administered 2023-07-06: 2 mg via ORAL
  Administered 2023-07-06: 1 mg via ORAL
  Filled 2023-07-05: qty 2
  Filled 2023-07-05: qty 1

## 2023-07-05 MED ORDER — AMIODARONE HCL 200 MG PO TABS
200.0000 mg | ORAL_TABLET | Freq: Every day | ORAL | Status: DC
  Administered 2023-07-05 – 2023-07-15 (×11): 200 mg via ORAL
  Filled 2023-07-05 (×11): qty 1

## 2023-07-05 MED ORDER — BISOPROLOL FUMARATE 5 MG PO TABS
5.0000 mg | ORAL_TABLET | Freq: Every day | ORAL | Status: DC
  Administered 2023-07-05 – 2023-07-15 (×11): 5 mg via ORAL
  Filled 2023-07-05 (×11): qty 1

## 2023-07-05 MED ORDER — HEPARIN (PORCINE) 25000 UT/250ML-% IV SOLN
1550.0000 [IU]/h | INTRAVENOUS | Status: DC
Start: 2023-07-05 — End: 2023-07-07
  Administered 2023-07-05: 1150 [IU]/h via INTRAVENOUS
  Administered 2023-07-06 – 2023-07-07 (×2): 1550 [IU]/h via INTRAVENOUS
  Filled 2023-07-05 (×3): qty 250

## 2023-07-05 MED ORDER — FUROSEMIDE 10 MG/ML IJ SOLN
40.0000 mg | Freq: Every day | INTRAMUSCULAR | Status: DC
  Administered 2023-07-05 – 2023-07-07 (×3): 40 mg via INTRAVENOUS
  Filled 2023-07-05 (×3): qty 4

## 2023-07-05 MED ORDER — UMECLIDINIUM BROMIDE 62.5 MCG/ACT IN AEPB
1.0000 | INHALATION_SPRAY | Freq: Every day | RESPIRATORY_TRACT | Status: DC
  Administered 2023-07-06 – 2023-07-15 (×10): 1 via RESPIRATORY_TRACT
  Filled 2023-07-05 (×2): qty 7

## 2023-07-05 MED ORDER — THIAMINE HCL 100 MG/ML IJ SOLN
100.0000 mg | Freq: Every day | INTRAMUSCULAR | Status: DC
Start: 2023-07-05 — End: 2023-07-15
  Filled 2023-07-05 (×5): qty 2

## 2023-07-05 MED ORDER — MAGNESIUM SULFATE 2 GM/50ML IV SOLN
2.0000 g | Freq: Once | INTRAVENOUS | Status: AC
  Administered 2023-07-05: 2 g via INTRAVENOUS
  Filled 2023-07-05: qty 50

## 2023-07-05 MED ORDER — ASPIRIN 81 MG PO TBEC
81.0000 mg | DELAYED_RELEASE_TABLET | Freq: Every day | ORAL | Status: DC
Start: 2023-07-05 — End: 2023-07-15
  Administered 2023-07-05 – 2023-07-15 (×11): 81 mg via ORAL
  Filled 2023-07-05 (×11): qty 1

## 2023-07-05 MED ORDER — LORAZEPAM 2 MG/ML IJ SOLN
1.0000 mg | INTRAMUSCULAR | Status: DC | PRN
  Administered 2023-07-06: 1 mg via INTRAVENOUS
  Administered 2023-07-06: 2 mg via INTRAVENOUS
  Filled 2023-07-05 (×2): qty 1

## 2023-07-05 MED ORDER — ADULT MULTIVITAMIN W/MINERALS CH
1.0000 | ORAL_TABLET | Freq: Every day | ORAL | Status: DC
  Administered 2023-07-05 – 2023-07-15 (×8): 1 via ORAL
  Filled 2023-07-05 (×12): qty 1

## 2023-07-05 MED ORDER — ISOSORBIDE MONONITRATE ER 60 MG PO TB24
60.0000 mg | ORAL_TABLET | Freq: Every day | ORAL | Status: DC
  Administered 2023-07-05 – 2023-07-06 (×2): 60 mg via ORAL
  Filled 2023-07-05 (×2): qty 1

## 2023-07-05 MED ORDER — DAPAGLIFLOZIN PROPANEDIOL 10 MG PO TABS
10.0000 mg | ORAL_TABLET | Freq: Every day | ORAL | Status: DC
  Administered 2023-07-06: 10 mg via ORAL
  Filled 2023-07-05: qty 1

## 2023-07-05 MED ORDER — PANTOPRAZOLE SODIUM 40 MG PO TBEC
40.0000 mg | DELAYED_RELEASE_TABLET | Freq: Every day | ORAL | Status: DC
  Administered 2023-07-05 – 2023-07-15 (×11): 40 mg via ORAL
  Filled 2023-07-05 (×11): qty 1

## 2023-07-05 MED ORDER — INSULIN ASPART 100 UNIT/ML IJ SOLN: 0.0000 [IU] | Freq: Every day | INTRAMUSCULAR | Status: DC

## 2023-07-05 MED ORDER — AMLODIPINE BESYLATE 5 MG PO TABS
5.0000 mg | ORAL_TABLET | Freq: Every day | ORAL | Status: DC
  Administered 2023-07-05 – 2023-07-06 (×2): 5 mg via ORAL
  Filled 2023-07-05 (×2): qty 1

## 2023-07-05 MED ORDER — ALBUTEROL SULFATE (2.5 MG/3ML) 0.083% IN NEBU: 3.0000 mL | INHALATION_SOLUTION | Freq: Four times a day (QID) | RESPIRATORY_TRACT | Status: DC | PRN

## 2023-07-05 MED ORDER — ROSUVASTATIN CALCIUM 20 MG PO TABS
20.0000 mg | ORAL_TABLET | Freq: Every day | ORAL | Status: DC
  Administered 2023-07-05 – 2023-07-15 (×11): 20 mg via ORAL
  Filled 2023-07-05 (×11): qty 1

## 2023-07-05 MED ORDER — POTASSIUM CHLORIDE 20 MEQ PO PACK
40.0000 meq | PACK | Freq: Once | ORAL | Status: AC
  Administered 2023-07-05: 40 meq via ORAL
  Filled 2023-07-05: qty 2

## 2023-07-05 MED ORDER — NITROGLYCERIN 0.4 MG SL SUBL: 0.4000 mg | SUBLINGUAL_TABLET | SUBLINGUAL | Status: DC | PRN

## 2023-07-05 MED ORDER — FLUTICASONE FUROATE-VILANTEROL 100-25 MCG/ACT IN AEPB
1.0000 | INHALATION_SPRAY | Freq: Every day | RESPIRATORY_TRACT | Status: DC
  Administered 2023-07-05 – 2023-07-15 (×11): 1 via RESPIRATORY_TRACT
  Filled 2023-07-05: qty 28

## 2023-07-05 NOTE — Assessment & Plan Note (Signed)
  As per outpatient records patient is on 2 L currently requiring 3 L to maintain saturations as well as prevent dyspnea likely multifactorial with probable PE as well as acute on chronic heart failure we will make euvolemic anticoagulate obtain chest x-ray, continuous pulse oximetry

## 2023-07-05 NOTE — Assessment & Plan Note (Signed)
  Hypokalemia 2.8 on outside hospital labs this a.m. we will recheck a stat and further supplement from what was done at outside hospital if required

## 2023-07-05 NOTE — Assessment & Plan Note (Signed)
   Elevated alkaline phosphatase 136 outside hospital we will obtain a gamma GT to exclude a liver source suspect is secondary to his poor pathology

## 2023-07-05 NOTE — Assessment & Plan Note (Signed)
Suspected toxic encephalopathy, recovering Patient presented to outside hospital with altered mental status waxing and waning along with hallucinations, the patient's drug screen was pan negative including benzodiazepines suspect this is potentially a polypharmacy issue with his SSRIs as a toxin versus complicated benzodiazepine withdrawal We will place the patient on WAS protocol not for alcohol but for benzodiazepines hold the patient's Wellbutrin and BuSpar and continue the altered mental status workup with CT head FTA-ABS B12 ammonia and serial neurological checks

## 2023-07-05 NOTE — Progress Notes (Addendum)
ANTICOAGULATION CONSULT NOTE  Pharmacy Consult for heparin Indication: DVT  Allergies  Allergen Reactions   Gabapentin Anxiety    Unknown reaction   Metformin And Related Rash    Patient Measurements:   Heparin Dosing Weight: 66.9 kg    Vital Signs: Temp: 98.1 F (36.7 C) (01/26 1502) Temp Source: Oral (01/26 1502) BP: 171/65 (01/26 1502) Pulse Rate: 63 (01/26 1502)  Labs: No results for input(s): "HGB", "HCT", "PLT", "APTT", "LABPROT", "INR", "HEPARINUNFRC", "HEPRLOWMOCWT", "CREATININE", "CKTOTAL", "CKMB", "TROPONINIHS" in the last 72 hours.  CrCl cannot be calculated (Patient's most recent lab result is older than the maximum 21 days allowed.).  Medical History: Past Medical History:  Diagnosis Date   Anxiety    Asthma    Atrial flutter (HCC) 04/2021   Chronic diastolic CHF (congestive heart failure) (HCC)    Chronic lower back pain    COPD (chronic obstructive pulmonary disease) (HCC)    Coronary artery disease    a. NSTEMI 05/2014 s/p DESx2 to LAD at [facility].   Depression    GERD (gastroesophageal reflux disease)    High cholesterol    History of tracheostomy    Hypertension    NSTEMI (non-ST elevated myocardial infarction) (HCC) 05/2014   with stent placement   PAF (paroxysmal atrial fibrillation) (HCC)    Sleep apnea    Stroke (HCC) 2017   anyeusym    TIA (transient ischemic attack)    "they say I've had some mini strokes; don't know when"; denies residual on 06/22/2014)   Tobacco abuse    Type II diabetes mellitus (HCC)    Ulcerative colitis (HCC)     Medications:  See MAR  Assessment: 78 yo male transferred from [facility] with elevated troponins and D-dimer (1318 ng/mL).  Patient prescribed Eliquis 5 mg BID for history of Afib but has been cost prohibitive and doesn't qualify for patient assistance, thus he has not been taking PTA.  [facility], last dose 1/26 at 11am. Pharmacy consulted for heparin dosing.  -CBC stable -  Hgb 13.2, pltc 196 (per outside records) -Will dose based on aPTT until correlating with anti-Xa given recent DOAC  Goal of Therapy:  Heparin level 0.3-0.7 units/ml aPTT 66-102 seconds Monitor platelets by anticoagulation protocol: Yes   Plan:  START heparin infusion 1150 units/hr at 2300 (~12h after last Eliquis dose) 8 hour aPTT, anti-Xa level Daily CBC, aPTT, anti-Xa level until correlating Monitor for s/sx of bleeding F/u imaging F/u long-term AC plans  Trixie Rude, PharmD Clinical Pharmacist 07/05/2023  4:03 PM

## 2023-07-05 NOTE — Assessment & Plan Note (Signed)
    Blood in urine Noted on urinalysis we will obtain a CPK and will need interval outpatient follow-up to exclude sinister pathology

## 2023-07-05 NOTE — Assessment & Plan Note (Signed)
    Type 2 diabetes mellitus Last A1c reassuring at 6.4 December 30 we will add sliding scale and continue the patient's farxiga

## 2023-07-05 NOTE — Assessment & Plan Note (Signed)
  Hypertension Continue amlodipine 5 mg daily

## 2023-07-05 NOTE — Assessment & Plan Note (Signed)
  Coronary artery disease Known by history, obtaining ECG continuing aspirin and high intensity statin CP free
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
## 2023-07-05 NOTE — Assessment & Plan Note (Signed)
  COPD Continuing the patient's home inhaler with as needed short acting beta agonist currently no wheeze on exam

## 2023-07-05 NOTE — Plan of Care (Signed)
  Problem: Clinical Measurements: Goal: Will remain free from infection Outcome: Progressing Goal: Cardiovascular complication will be avoided Outcome: Progressing   Problem: Nutrition: Goal: Adequate nutrition will be maintained Outcome: Progressing   Problem: Safety: Goal: Ability to remain free from injury will improve Outcome: Progressing   Problem: Metabolic: Goal: Ability to maintain appropriate glucose levels will improve Outcome: Progressing   Problem: Nutritional: Goal: Maintenance of adequate nutrition will improve Outcome: Progressing

## 2023-07-05 NOTE — Assessment & Plan Note (Signed)
Acute on chronic diastolic heart failure Last echocardiogram from 08/2021 with grade 1 diastolic dysfunction proBNP at outside hospital was 4970 yesterday, dependent edema We will start IV diuresis with Lasix, continue the patient's farxiga 10 mg

## 2023-07-05 NOTE — Assessment & Plan Note (Signed)
  Non ischemic and nontraumatic myocardial injury Patient has elevated high-sensitivity troponin 148 150, 201 at outside hospital Suspect this is likely to ventricular wall stress from a pulmonary embolism or from his heart failure Differential diagnosis would be a type II MI, we will obtain echocardiogram to rule out any new wall motion abnormality and will need interval cardiology review of note he was previously planned for [facility] as outpatient January 7 of this year which was not obtained, the patient denies any chest pain palpitations or cardiac symptoms Patient is on heparin as well as aspirin

## 2023-07-05 NOTE — Assessment & Plan Note (Signed)
   Pulmonary nodules Seen on June 22, 2023 CT at outside hospital in the setting of CVA concerning for possible malignancy needs timely outpatient follow-up
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
## 2023-07-05 NOTE — Assessment & Plan Note (Signed)
    Paroxysmal atrial fibrillation Obtaining a twelve-lead ECG amiodarone 200 mg daily continuing on bisoprolol 5 mg continuing

## 2023-07-05 NOTE — Assessment & Plan Note (Signed)
Chronic kidney disease stage IIIb Patient's creatinine from earlier today at outside hospital was 1.7 improved from his baseline 1.75 from June 09, 2023 Will avoid nephrotoxins and monitor

## 2023-07-05 NOTE — Assessment & Plan Note (Signed)
  Hyperlipidemia Continue rosuvastatin 20 mg daily
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
## 2023-07-06 ENCOUNTER — Inpatient Hospital Stay (HOSPITAL_COMMUNITY): Payer: Medicare HMO

## 2023-07-06 DIAGNOSIS — G929 Unspecified toxic encephalopathy: Secondary | ICD-10-CM

## 2023-07-06 DIAGNOSIS — R9431 Abnormal electrocardiogram [ECG] [EKG]: Secondary | ICD-10-CM | POA: Diagnosis not present

## 2023-07-06 DIAGNOSIS — K5190 Ulcerative colitis, unspecified, without complications: Secondary | ICD-10-CM | POA: Diagnosis not present

## 2023-07-06 DIAGNOSIS — I480 Paroxysmal atrial fibrillation: Secondary | ICD-10-CM | POA: Diagnosis not present

## 2023-07-06 DIAGNOSIS — S6292XA Unspecified fracture of left wrist and hand, initial encounter for closed fracture: Secondary | ICD-10-CM | POA: Diagnosis not present

## 2023-07-06 DIAGNOSIS — J45909 Unspecified asthma, uncomplicated: Secondary | ICD-10-CM | POA: Diagnosis not present

## 2023-07-06 DIAGNOSIS — I5033 Acute on chronic diastolic (congestive) heart failure: Secondary | ICD-10-CM | POA: Diagnosis not present

## 2023-07-06 DIAGNOSIS — S82112A Displaced fracture of left tibial spine, initial encounter for closed fracture: Secondary | ICD-10-CM | POA: Diagnosis not present

## 2023-07-06 DIAGNOSIS — I10 Essential (primary) hypertension: Secondary | ICD-10-CM | POA: Diagnosis not present

## 2023-07-06 DIAGNOSIS — F419 Anxiety disorder, unspecified: Secondary | ICD-10-CM | POA: Diagnosis not present

## 2023-07-06 DIAGNOSIS — I2699 Other pulmonary embolism without acute cor pulmonale: Secondary | ICD-10-CM | POA: Diagnosis not present

## 2023-07-06 DIAGNOSIS — G4730 Sleep apnea, unspecified: Secondary | ICD-10-CM | POA: Diagnosis not present

## 2023-07-06 LAB — COMPREHENSIVE METABOLIC PANEL
ALT: 16 U/L (ref 0–44)
AST: 15 U/L (ref 15–41)
Albumin: 2.5 g/dL — ABNORMAL LOW (ref 3.5–5.0)
Alkaline Phosphatase: 112 U/L (ref 38–126)
Anion gap: 9 (ref 5–15)
BUN: 19 mg/dL (ref 8–23)
CO2: 26 mmol/L (ref 22–32)
Calcium: 8.2 mg/dL — ABNORMAL LOW (ref 8.9–10.3)
Chloride: 100 mmol/L (ref 98–111)
Creatinine, Ser: 1.68 mg/dL — ABNORMAL HIGH (ref 0.61–1.24)
GFR, Estimated: 42 mL/min — ABNORMAL LOW (ref >60)
Glucose, Bld: 133 mg/dL — ABNORMAL HIGH (ref 70–99)
Potassium: 3.2 mmol/L — ABNORMAL LOW (ref 3.5–5.1)
Sodium: 135 mmol/L (ref 135–145)
Total Bilirubin: 1 mg/dL (ref 0.0–1.2)
Total Protein: 5.4 g/dL — ABNORMAL LOW (ref 6.5–8.1)

## 2023-07-06 LAB — CBC
HCT: 37.2 % — ABNORMAL LOW (ref 39.0–52.0)
Hemoglobin: 12.4 g/dL — ABNORMAL LOW (ref 13.0–17.0)
MCH: 27.9 pg (ref 26.0–34.0)
MCHC: 33.3 g/dL (ref 30.0–36.0)
MCV: 83.8 fL (ref 80.0–100.0)
Platelets: 447 10*3/uL — ABNORMAL HIGH (ref 150–400)
RBC: 4.44 MIL/uL (ref 4.22–5.81)
RDW: 15.8 % — ABNORMAL HIGH (ref 11.5–15.5)
WBC: 7.4 10*3/uL (ref 4.0–10.5)
nRBC: 0 % (ref 0.0–0.2)

## 2023-07-06 LAB — APTT
aPTT: 34 s (ref 24–36)
aPTT: 54 s — ABNORMAL HIGH (ref 24–36)
aPTT: 76 s — ABNORMAL HIGH (ref 24–36)

## 2023-07-06 LAB — ECHOCARDIOGRAM COMPLETE
Area-P 1/2: 2.32 cm2
Calc EF: 57.2 %
Height: 69 in
S' Lateral: 3.7 cm
Single Plane A2C EF: 66.7 %
Single Plane A4C EF: 51.6 %
Weight: 2440.93 [oz_av]

## 2023-07-06 LAB — TSH: TSH: 0.502 u[IU]/mL (ref 0.350–4.500)

## 2023-07-06 LAB — GLUCOSE, CAPILLARY
Glucose-Capillary: 159 mg/dL — ABNORMAL HIGH (ref 70–99)
Glucose-Capillary: 76 mg/dL (ref 70–99)
Glucose-Capillary: 99 mg/dL (ref 70–99)
Glucose-Capillary: 84 mg/dL (ref 70–99)

## 2023-07-06 LAB — HEPARIN LEVEL (UNFRACTIONATED)
Heparin Unfractionated: >1.1 [IU]/mL — ABNORMAL HIGH (ref 0.30–0.70)
Heparin Unfractionated: >1.1 [IU]/mL — ABNORMAL HIGH (ref 0.30–0.70)

## 2023-07-06 LAB — T.PALLIDUM AB, TOTAL: T Pallidum Abs: NONREACTIVE

## 2023-07-06 MED ORDER — ISOSORBIDE MONONITRATE ER 30 MG PO TB24
30.0000 mg | ORAL_TABLET | Freq: Every day | ORAL | Status: DC
Start: 2023-07-06 — End: 2023-07-15
  Administered 2023-07-07 – 2023-07-15 (×9): 30 mg via ORAL
  Filled 2023-07-06 (×10): qty 1

## 2023-07-06 MED ORDER — THIAMINE HCL 100 MG/ML IJ SOLN
500.0000 mg | Freq: Three times a day (TID) | INTRAVENOUS | Status: DC
  Administered 2023-07-06 – 2023-07-09 (×8): 500 mg via INTRAVENOUS
  Filled 2023-07-06 (×9): qty 5

## 2023-07-06 MED ORDER — AMLODIPINE BESYLATE 10 MG PO TABS
10.0000 mg | ORAL_TABLET | Freq: Every day | ORAL | Status: DC
  Administered 2023-07-07 – 2023-07-15 (×9): 10 mg via ORAL
  Filled 2023-07-06 (×9): qty 1
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
## 2023-07-06 NOTE — Progress Notes (Signed)
OT Cancellation Note  Patient Details Name: MIHIR FLANIGAN MRN: [id number] DOB: 1946/05/06   Cancelled Treatment:    Reason Eval/Treat Not Completed: Medical issues which prohibited therapy Pt with elevated d dimer and pending r/o of DVT. Will hold OT eval for now and follow up as able.  Lorre Munroe 07/06/2023, 9:54 AM

## 2023-07-06 NOTE — Progress Notes (Signed)
ANTICOAGULATION CONSULT NOTE  Pharmacy Consult for heparin Indication: DVT  Allergies  Allergen Reactions   Gabapentin Anxiety    Unknown reaction   Metformin And Related Rash    Patient Measurements: Height: 5\' 9"  (175.3 cm) Weight: 66.9 kg (147 lb 7.8 oz) IBW/kg (Calculated) : 70.7 Heparin Dosing Weight: 66.9 kg HEPARIN DW (KG): 66.9  Vital Signs: Temp: 97.8 F (36.6 C) (01/26 2348) Temp Source: Oral (01/26 2348) BP: 127/63 (01/26 2348) Pulse Rate: 63 (01/26 1952)  Labs: Recent Labs    07/05/23 1559 07/06/23 0048  HGB 12.6*  --   HCT 37.5*  --   PLT 445*  --   APTT  --  34  HEPARINUNFRC  --  >1.10*  CREATININE 1.57*  --   CKTOTAL 32*  --     Estimated Creatinine Clearance: 38.5 mL/min (A) (by C-G formula based on SCr of 1.57 mg/dL (H)).  Medical History: Past Medical History:  Diagnosis Date   Anxiety    Asthma    Atrial flutter (HCC) 04/2021   Chronic diastolic CHF (congestive heart failure) (HCC)    Chronic lower back pain    COPD (chronic obstructive pulmonary disease) (HCC)    Coronary artery disease    a. NSTEMI 05/2014 s/p DESx2 to LAD at [facility].   Depression    GERD (gastroesophageal reflux disease)    High cholesterol    History of tracheostomy    Hypertension    NSTEMI (non-ST elevated myocardial infarction) (HCC) 05/2014   with stent placement   PAF (paroxysmal atrial fibrillation) (HCC)    Sleep apnea    Stroke (HCC) 2017   anyeusym    TIA (transient ischemic attack)    "they say I've had some mini strokes; don't know when"; denies residual on 06/22/2014)   Tobacco abuse    Type II diabetes mellitus (HCC)    Ulcerative colitis (HCC)     Medications:  See MAR  Assessment: 78 yo male transferred from [facility] with elevated troponins and D-dimer (1318 ng/mL).  Patient prescribed Eliquis 5 mg BID for history of Afib but has been cost prohibitive and doesn't qualify for patient assistance, thus he has not been taking PTA.  Was  started on Eliquis at [facility], last dose 1/26 at 11am. Pharmacy consulted for heparin dosing.  -CBC stable - Hgb 13.2, pltc 196 (per outside records) -Will dose based on aPTT until correlating with anti-Xa given recent DOAC  1/27 AM update:  aPTT sub-therapeutic   Goal of Therapy:  Heparin level 0.3-0.7 units/ml aPTT 66-102 seconds Monitor platelets by anticoagulation protocol: Yes   Plan:  Inc heparin to 1350 units/hr 8 hour aPTT, anti-Xa level Daily CBC, aPTT, anti-Xa level until correlating Monitor for s/sx of bleeding F/u imaging F/u long-term AC plans  Abran Duke, PharmD, BCPS Clinical Pharmacist Phone: [phone]

## 2023-07-06 NOTE — Progress Notes (Signed)
Orthopedic Tech Progress Note Patient Details:  Ronald [name] 17-Apr-1946 [id number]  Ortho Devices Type of Ortho Device: Sugartong splint Ortho Device/Splint Location: LUE Ortho Device/Splint Interventions: Application, Ordered   Post Interventions Patient Tolerated: Well  Ronald [name] 07/06/2023, 6:30 PM

## 2023-07-06 NOTE — Progress Notes (Addendum)
Pt woke up very anxious, looked flushed. Asking for his xanax. Mild tremors present. Complains of pins and needles everywhere. Got agitated about xanax not being on his MAR. CIWA at time 10. PRN ativan given. Pt was better after an hour CIWA 4.   0450: Pt was shouting at the window. 'what are you doing?' having visual hallucinations and says he see someone by the window, and that he see smoke in his room. Pt then pointed to the brown blanket and ask "is this [name]?'. Pt Disoriented to time and place. Reoriented pt to reality. CIWA 7. Prn given. Will continue to monitor

## 2023-07-06 NOTE — Progress Notes (Signed)
ANTICOAGULATION CONSULT NOTE  Pharmacy Consult for heparin Indication: DVT  Allergies  Allergen Reactions   Gabapentin Anxiety    Unknown reaction   Metformin And Related Rash    Patient Measurements: Height: 5\' 9"  (175.3 cm) Weight: 69.2 kg (152 lb 8.9 oz) IBW/kg (Calculated) : 70.7 Heparin Dosing Weight: 66.9 kg HEPARIN DW (KG): 66.9  Vital Signs: Temp: 98 F (36.7 C) (01/27 1603) Temp Source: Axillary (01/27 1603) BP: 135/48 (01/27 2001) Pulse Rate: 57 (01/27 2001)  Labs: Recent Labs    07/05/23 1559 07/06/23 0048 07/06/23 0359 07/06/23 1121  HGB 12.6*  --  12.4*  --   HCT 37.5*  --  37.2*  --   PLT 445*  --  447*  --   APTT  --  34  --  54*  HEPARINUNFRC  --  >1.10*  --  >1.10*  CREATININE 1.57*  --  1.68*  --   CKTOTAL 32*  --   --   --     Estimated Creatinine Clearance: 37.2 mL/min (A) (by C-G formula based on SCr of 1.68 mg/dL (H)).  Medical History: Past Medical History:  Diagnosis Date   Anxiety    Asthma    Atrial flutter (HCC) 04/2021   Chronic diastolic CHF (congestive heart failure) (HCC)    Chronic lower back pain    COPD (chronic obstructive pulmonary disease) (HCC)    Coronary artery disease    a. NSTEMI 05/2014 s/p DESx2 to LAD at [facility].   Depression    GERD (gastroesophageal reflux disease)    High cholesterol    History of tracheostomy    Hypertension    NSTEMI (non-ST elevated myocardial infarction) (HCC) 05/2014   with stent placement   PAF (paroxysmal atrial fibrillation) (HCC)    Sleep apnea    Stroke (HCC) 2017   anyeusym    TIA (transient ischemic attack)    "they say I've had some mini strokes; don't know when"; denies residual on 06/22/2014)   Tobacco abuse    Type II diabetes mellitus (HCC)    Ulcerative colitis (HCC)     Medications:  See MAR  Assessment: 78 yo male transferred from [facility] with elevated troponins and D-dimer (1318 ng/mL).  Patient prescribed Eliquis 5 mg BID for history of Afib but has  been cost prohibitive and doesn't qualify for patient assistance, thus he has not been taking PTA.  Was started on Eliquis at [facility], last dose 1/26 at 11am. Pharmacy consulted for heparin dosing.  aPTT 76 is therapeutic on 1550 units/hr.  Goal of Therapy:  Heparin level 0.3-0.7 units/ml aPTT 66-102 seconds Monitor platelets by anticoagulation protocol: Yes   Plan:  Continue heparin to 1550 units/hr F/u aPTT until correlates with heparin level  Monitor daily aPTT, heparin level, CBC, signs/symptoms of bleeding     Alphia Moh, PharmD, BCPS, [facility] Clinical Pharmacist  Please check AMION for all [facility] phone numbers After 10:00 PM, call Main Pharmacy [phone]

## 2023-07-06 NOTE — Progress Notes (Signed)
  Echocardiogram 2D Echocardiogram has been performed.  Janalyn Harder 07/06/2023, 2:56 PM

## 2023-07-06 NOTE — Progress Notes (Signed)
ANTICOAGULATION CONSULT NOTE  Pharmacy Consult for heparin Indication: DVT  Allergies  Allergen Reactions   Gabapentin Anxiety    Unknown reaction   Metformin And Related Rash    Patient Measurements: Height: 5\' 9"  (175.3 cm) Weight: 69.2 kg (152 lb 8.9 oz) IBW/kg (Calculated) : 70.7 Heparin Dosing Weight: 66.9 kg HEPARIN DW (KG): 66.9  Vital Signs: Temp: 98 F (36.7 C) (01/27 1205) Temp Source: Oral (01/27 1205) BP: 166/66 (01/27 1205) Pulse Rate: 62 (01/27 1205)  Labs: Recent Labs    07/05/23 1559 07/06/23 0048 07/06/23 0359 07/06/23 1121  HGB 12.6*  --  12.4*  --   HCT 37.5*  --  37.2*  --   PLT 445*  --  447*  --   APTT  --  34  --  54*  HEPARINUNFRC  --  >1.10*  --  >1.10*  CREATININE 1.57*  --  1.68*  --   CKTOTAL 32*  --   --   --     Estimated Creatinine Clearance: 37.2 mL/min (A) (by C-G formula based on SCr of 1.68 mg/dL (H)).  Medical History: Past Medical History:  Diagnosis Date   Anxiety    Asthma    Atrial flutter (HCC) 04/2021   Chronic diastolic CHF (congestive heart failure) (HCC)    Chronic lower back pain    COPD (chronic obstructive pulmonary disease) (HCC)    Coronary artery disease    a. NSTEMI 05/2014 s/p DESx2 to LAD at [facility].   Depression    GERD (gastroesophageal reflux disease)    High cholesterol    History of tracheostomy    Hypertension    NSTEMI (non-ST elevated myocardial infarction) (HCC) 05/2014   with stent placement   PAF (paroxysmal atrial fibrillation) (HCC)    Sleep apnea    Stroke (HCC) 2017   anyeusym    TIA (transient ischemic attack)    "they say I've had some mini strokes; don't know when"; denies residual on 06/22/2014)   Tobacco abuse    Type II diabetes mellitus (HCC)    Ulcerative colitis (HCC)     Medications:  See MAR  Assessment: 78 yo male transferred from [facility] with elevated troponins and D-dimer (1318 ng/mL).  Patient prescribed Eliquis 5 mg BID for history of Afib but has been  cost prohibitive and doesn't qualify for patient assistance, thus he has not been taking PTA.  Was started on Eliquis at [facility], last dose 1/26 at 11am. Pharmacy consulted for heparin dosing.  -heparin level> 1.1 (die to recent Eliquis) -aPTT= 54 and below goal on 1350 units/hr -CBC stable   Goal of Therapy:  Heparin level 0.3-0.7 units/ml aPTT 66-102 seconds Monitor platelets by anticoagulation protocol: Yes   Plan:  -Increase heparin to 1550 units/hr -aPTT in 8 hrs  Harland German, PharmD Clinical Pharmacist **Pharmacist phone directory can now be found on [email or web address] (PW [email or web address]).  Listed under [facility].
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
## 2023-07-06 NOTE — Progress Notes (Signed)
PT Cancellation Note  Patient Details Name: Ronald [name] MRN: [id number] DOB: 03-Apr-1946   Cancelled Treatment:    Reason Eval/Treat Not Completed: Patient at procedure or test/unavailable;Other (comment) (Awaiting clarification from MD regarding pt's WB status and ability to mobilize given sub-therapeutic aPTT.)  Cheri Guppy, PT, DPT Acute Rehabilitation Services Office: [phone] Secure Chat Preferred  Richardson Chiquito 07/06/2023, 9:01 AM

## 2023-07-06 NOTE — Plan of Care (Signed)
Problem: Clinical Measurements: Goal: Will remain free from infection Outcome: Progressing Goal: Respiratory complications will improve Outcome: Progressing   Problem: Safety: Goal: Ability to remain free from injury will improve Outcome: Progressing

## 2023-07-07 ENCOUNTER — Ambulatory Visit: Payer: Medicare HMO | Admitting: Internal Medicine

## 2023-07-07 ENCOUNTER — Inpatient Hospital Stay (HOSPITAL_COMMUNITY): Payer: Medicare HMO

## 2023-07-07 ENCOUNTER — Encounter: Payer: Self-pay | Admitting: Internal Medicine

## 2023-07-07 DIAGNOSIS — M7989 Other specified soft tissue disorders: Secondary | ICD-10-CM

## 2023-07-07 DIAGNOSIS — G9340 Encephalopathy, unspecified: Secondary | ICD-10-CM

## 2023-07-07 DIAGNOSIS — I5033 Acute on chronic diastolic (congestive) heart failure: Secondary | ICD-10-CM

## 2023-07-07 DIAGNOSIS — N1832 Chronic kidney disease, stage 3b: Secondary | ICD-10-CM

## 2023-07-07 DIAGNOSIS — R748 Abnormal levels of other serum enzymes: Secondary | ICD-10-CM

## 2023-07-07 DIAGNOSIS — I480 Paroxysmal atrial fibrillation: Secondary | ICD-10-CM

## 2023-07-07 DIAGNOSIS — I5A Non-ischemic myocardial injury (non-traumatic): Secondary | ICD-10-CM | POA: Diagnosis not present

## 2023-07-07 DIAGNOSIS — E876 Hypokalemia: Secondary | ICD-10-CM | POA: Diagnosis not present

## 2023-07-07 DIAGNOSIS — R918 Other nonspecific abnormal finding of lung field: Secondary | ICD-10-CM

## 2023-07-07 DIAGNOSIS — E782 Mixed hyperlipidemia: Secondary | ICD-10-CM

## 2023-07-07 DIAGNOSIS — J449 Chronic obstructive pulmonary disease, unspecified: Secondary | ICD-10-CM

## 2023-07-07 LAB — CBC
HCT: 36.9 % — ABNORMAL LOW (ref 39.0–52.0)
Hemoglobin: 12.3 g/dL — ABNORMAL LOW (ref 13.0–17.0)
MCH: 27.8 pg (ref 26.0–34.0)
MCHC: 33.3 g/dL (ref 30.0–36.0)
MCV: 83.5 fL (ref 80.0–100.0)
Platelets: 382 10*3/uL (ref 150–400)
RBC: 4.42 MIL/uL (ref 4.22–5.81)
RDW: 15.7 % — ABNORMAL HIGH (ref 11.5–15.5)
WBC: 8.5 10*3/uL (ref 4.0–10.5)
nRBC: 0 % (ref 0.0–0.2)

## 2023-07-07 LAB — BASIC METABOLIC PANEL
Anion gap: 8 (ref 5–15)
BUN: 16 mg/dL (ref 8–23)
CO2: 30 mmol/L (ref 22–32)
Calcium: 8.4 mg/dL — ABNORMAL LOW (ref 8.9–10.3)
Chloride: 101 mmol/L (ref 98–111)
Creatinine, Ser: 1.64 mg/dL — ABNORMAL HIGH (ref 0.61–1.24)
GFR, Estimated: 43 mL/min — ABNORMAL LOW (ref >60)
Glucose, Bld: 166 mg/dL — ABNORMAL HIGH (ref 70–99)
Potassium: 2.9 mmol/L — ABNORMAL LOW (ref 3.5–5.1)
Sodium: 139 mmol/L (ref 135–145)

## 2023-07-07 LAB — GLUCOSE, CAPILLARY
Glucose-Capillary: 123 mg/dL — ABNORMAL HIGH (ref 70–99)
Glucose-Capillary: 141 mg/dL — ABNORMAL HIGH (ref 70–99)
Glucose-Capillary: 185 mg/dL — ABNORMAL HIGH (ref 70–99)
Glucose-Capillary: 153 mg/dL — ABNORMAL HIGH (ref 70–99)

## 2023-07-07 LAB — PROTIME-INR
INR: 1.2 (ref 0.8–1.2)
Prothrombin Time: 15.8 s — ABNORMAL HIGH (ref 11.4–15.2)

## 2023-07-07 LAB — HEPARIN LEVEL (UNFRACTIONATED): Heparin Unfractionated: 0.96 [IU]/mL — ABNORMAL HIGH (ref 0.30–0.70)

## 2023-07-07 LAB — APTT: aPTT: 72 s — ABNORMAL HIGH (ref 24–36)

## 2023-07-07 LAB — TROPONIN I (HIGH SENSITIVITY): Troponin I (High Sensitivity): 47 ng/L — ABNORMAL HIGH (ref <18)

## 2023-07-07 MED ORDER — APIXABAN 5 MG PO TABS
5.0000 mg | ORAL_TABLET | Freq: Two times a day (BID) | ORAL | Status: DC
  Administered 2023-07-07 – 2023-07-15 (×16): 5 mg via ORAL
  Filled 2023-07-07 (×16): qty 1

## 2023-07-07 MED ORDER — POTASSIUM CHLORIDE CRYS ER 20 MEQ PO TBCR
60.0000 meq | EXTENDED_RELEASE_TABLET | Freq: Once | ORAL | Status: AC
Start: 2023-07-07 — End: 2023-07-07
  Administered 2023-07-07: 60 meq via ORAL
  Filled 2023-07-07: qty 3

## 2023-07-07 MED ORDER — FUROSEMIDE 20 MG PO TABS
20.0000 mg | ORAL_TABLET | Freq: Every day | ORAL | Status: DC
  Administered 2023-07-08 – 2023-07-15 (×8): 20 mg via ORAL
  Filled 2023-07-07 (×8): qty 1

## 2023-07-07 MED ORDER — ACETAMINOPHEN 325 MG PO TABS
650.0000 mg | ORAL_TABLET | Freq: Four times a day (QID) | ORAL | Status: DC | PRN
  Administered 2023-07-07 – 2023-07-08 (×4): 650 mg via ORAL
  Filled 2023-07-07 (×5): qty 2
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
## 2023-07-07 NOTE — Progress Notes (Signed)
ANTICOAGULATION CONSULT NOTE  Pharmacy Consult for heparin >> apixaban  Indication: DVT  Allergies  Allergen Reactions   Gabapentin Anxiety    Unknown reaction   Metformin And Related Rash    Patient Measurements: Height: 5\' 9"  (175.3 cm) Weight: 67.2 kg (148 lb 2.4 oz) IBW/kg (Calculated) : 70.7 Heparin Dosing Weight: 66.9 kg HEPARIN DW (KG): 66.9  Vital Signs: Temp: 98.6 F (37 C) (01/28 1938) Temp Source: Oral (01/28 1938) BP: 119/42 (01/28 1938) Pulse Rate: 63 (01/28 1938)  Labs: Recent Labs    07/05/23 1559 07/05/23 1559 07/06/23 0048 07/06/23 0359 07/06/23 1121 07/06/23 2210 07/07/23 0433  HGB 12.6*  --   --  12.4*  --   --  12.3*  HCT 37.5*  --   --  37.2*  --   --  36.9*  PLT 445*  --   --  447*  --   --  382  APTT  --    < > 34  --  54* 76* 72*  LABPROT  --   --   --   --   --   --  15.8*  INR  --   --   --   --   --   --  1.2  HEPARINUNFRC  --   --  >1.10*  --  >1.10*  --  0.96*  CREATININE 1.57*  --   --  1.68*  --   --  1.64*  CKTOTAL 32*  --   --   --   --   --   --    < > = values in this interval not displayed.    Estimated Creatinine Clearance: 37 mL/min (A) (by C-G formula based on SCr of 1.64 mg/dL (H)).  Medical History: Past Medical History:  Diagnosis Date   Anxiety    Asthma    Atrial flutter (HCC) 04/2021   Chronic diastolic CHF (congestive heart failure) (HCC)    Chronic lower back pain    COPD (chronic obstructive pulmonary disease) (HCC)    Coronary artery disease    a. NSTEMI 05/2014 s/p DESx2 to LAD at [facility]   Depression    GERD (gastroesophageal reflux disease)    High cholesterol    History of tracheostomy    Hypertension    NSTEMI (non-ST elevated myocardial infarction) (HCC) 05/2014   with stent placement   PAF (paroxysmal atrial fibrillation) (HCC)    Sleep apnea    Stroke (HCC) 2017   anyeusym    TIA (transient ischemic attack)    "they say I've had some mini strokes; don't know when"; denies residual on  06/22/2014)   Tobacco abuse    Type II diabetes mellitus (HCC)    Ulcerative colitis (HCC)     Medications:  See MAR  Assessment: 78 yo male transferred from [facility] with elevated troponins and D-dimer (1318 ng/mL).  Patient prescribed Eliquis 5 mg BID for history of Afib but has been cost prohibitive and doesn't qualify for patient assistance, thus he has not been taking PTA.  Was started on Eliquis at [facility], last dose 1/26 at 11am. Pharmacy consulted for heparin to apixaban.   Goal of Therapy:  Heparin level 0.3-0.7 units/ml aPTT 66-102 seconds Monitor platelets by anticoagulation protocol: Yes   Plan:  Stop heparin Apixaban 5mg  BID Monitor signs/symptoms of bleeding   Alphia Moh, PharmD, BCPS, BCCP Clinical Pharmacist  Please check AMION for all [facility] Pharmacy phone numbers After 10:00 PM, call Main Pharmacy  [phone]  

## 2023-07-07 NOTE — Progress Notes (Signed)
Heart Failure Navigator Progress Note  Assessed for Heart & Vascular TOC clinic readiness.  Patient does not meet criteria due to EF 55-60%, Has a scheduled [facility] appointment on 07/22/23.    Navigator will sign off at this time.   Rhae Hammock, BSN, Scientist, clinical (histocompatibility and immunogenetics) Only

## 2023-07-07 NOTE — Consult Note (Signed)
[facility] Liaison Consult Note   Patient: Ronald [name]  DOB: Mar 18, 1946  MRN: [id number]  Insurance: Orpah Clinton  Primary Care Provider: [name], FNP, with [facility] Medicine, this provider is listed for the transition of care follow up appointments  and [facility] calls   [facility] Liaison met patient at bedside at [facility]. Patient asleep on rounds, reviewed as patient was noted for encephalopathy, did not awaken. No family/visitors at bedside. Spoke with nurse regarding activity as well.   The patient was screened for less than 30 day readmission hospitalization with noted high risk score for unplanned readmission risk 2 hospital admissions in 6 months at a [facility] facility. PT/OT evals reviewed, inpatient TOC reviewed.  The patient was assessed for potential Community Care Coordination service needs for post hospital transition for care coordination. Review of patient's electronic medical record reveals patient is with ongoing assessment of post hospital needs.   Plan: [facility] Liaison will continue to follow progress and disposition to asess for post hospital community care coordination/management needs.  Referral request for community care coordination: disposition not finalized.   [facility] Community Care, Population Health does not replace or interfere with any arrangements made by the Inpatient Transition of Care team.   For questions contact:   Charlesetta Shanks, RN, BSN, CCM [place]  [facility] Liaison Direct Dial: [phone] or secure chat Email: [email or web address]        c

## 2023-07-07 NOTE — Progress Notes (Signed)
ANTICOAGULATION CONSULT NOTE  Pharmacy Consult for heparin Indication: DVT  Allergies  Allergen Reactions   Gabapentin Anxiety    Unknown reaction   Metformin And Related Rash    Patient Measurements: Height: 5\' 9"  (175.3 cm) Weight: 67.2 kg (148 lb 2.4 oz) IBW/kg (Calculated) : 70.7 Heparin Dosing Weight: 66.9 kg HEPARIN DW (KG): 66.9  Vital Signs: Temp: 98.6 F (37 C) (01/28 0319) Temp Source: Oral (01/28 0319) BP: 135/50 (01/28 0319) Pulse Rate: 65 (01/28 0319)  Labs: Recent Labs    07/05/23 1559 07/05/23 1559 07/06/23 0048 07/06/23 0359 07/06/23 1121 07/06/23 2210 07/07/23 0433  HGB 12.6*  --   --  12.4*  --   --  12.3*  HCT 37.5*  --   --  37.2*  --   --  36.9*  PLT 445*  --   --  447*  --   --  382  APTT  --    < > 34  --  54* 76* 72*  LABPROT  --   --   --   --   --   --  15.8*  INR  --   --   --   --   --   --  1.2  HEPARINUNFRC  --   --  >1.10*  --  >1.10*  --  0.96*  CREATININE 1.57*  --   --  1.68*  --   --  1.64*  CKTOTAL 32*  --   --   --   --   --   --    < > = values in this interval not displayed.    Estimated Creatinine Clearance: 37 mL/min (A) (by C-G formula based on SCr of 1.64 mg/dL (H)).  Medical History: Past Medical History:  Diagnosis Date   Anxiety    Asthma    Atrial flutter (HCC) 04/2021   Chronic diastolic CHF (congestive heart failure) (HCC)    Chronic lower back pain    COPD (chronic obstructive pulmonary disease) (HCC)    Coronary artery disease    a. NSTEMI 05/2014 s/p DESx2 to LAD at [facility].   Depression    GERD (gastroesophageal reflux disease)    High cholesterol    History of tracheostomy    Hypertension    NSTEMI (non-ST elevated myocardial infarction) (HCC) 05/2014   with stent placement   PAF (paroxysmal atrial fibrillation) (HCC)    Sleep apnea    Stroke (HCC) 2017   anyeusym    TIA (transient ischemic attack)    "they say I've had some mini strokes; don't know when"; denies residual on 06/22/2014)    Tobacco abuse    Type II diabetes mellitus (HCC)    Ulcerative colitis (HCC)     Medications:  See MAR  Assessment: 78 yo male transferred from [facility] with elevated troponins and D-dimer (1318 ng/mL).  Patient prescribed Eliquis 5 mg BID for history of Afib but has been cost prohibitive and doesn't qualify for patient assistance, thus he has not been taking PTA.  Was started on Eliquis at [facility], last dose 1/26 at 11am. Pharmacy consulted for heparin dosing.  -aPTT 72 sec, therapeutic on 1550 units/hr. -anti-Xa level 0.96 still impacted by recent DOAC -CBC stable   Goal of Therapy:  Heparin level 0.3-0.7 units/ml aPTT 66-102 seconds Monitor platelets by anticoagulation protocol: Yes   Plan:  Continue heparin to 1550 units/hr F/u aPTT until correlates with heparin level  Monitor daily aPTT, heparin level, CBC, signs/symptoms  of bleeding    Trixie Rude, PharmD Clinical Pharmacist 07/07/2023  7:30 AM
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
## 2023-07-07 NOTE — Discharge Instructions (Signed)
Information on my medicine - ELIQUIS (apixaban)  This medication education was reviewed with me or my healthcare representative as part of my discharge preparation.     Why was Eliquis prescribed for you? Eliquis was prescribed for you to reduce the risk of a blood clot forming that can cause a stroke if you have a medical condition called atrial fibrillation (a type of irregular heartbeat).  What do You need to know about Eliquis ? Take your Eliquis TWICE DAILY - one tablet in the morning and one tablet in the evening with or without food. If you have difficulty swallowing the tablet whole please discuss with your pharmacist how to take the medication safely.  Take Eliquis exactly as prescribed by your doctor and DO NOT stop taking Eliquis without talking to the doctor who prescribed the medication.  Stopping may increase your risk of developing a stroke.  Refill your prescription before you run out.  After discharge, you should have regular check-up appointments with your healthcare provider that is prescribing your Eliquis.  In the future your dose may need to be changed if your kidney function or weight changes by a significant amount or as you get older.  What do you do if you miss a dose? If you miss a dose, take it as soon as you remember on the same day and resume taking twice daily.  Do not take more than one dose of ELIQUIS at the same time to make up a missed dose.  Important Safety Information A possible side effect of Eliquis is bleeding. You should call your healthcare provider right away if you experience any of the following: Bleeding from an injury or your nose that does not stop. Unusual colored urine (red or dark brown) or unusual colored stools (red or black). Unusual bruising for unknown reasons. A serious fall or if you hit your head (even if there is no bleeding).  Some medicines may interact with Eliquis and might increase your risk of bleeding or clotting  while on Eliquis. To help avoid this, consult your healthcare provider or pharmacist prior to using any new prescription or non-prescription medications, including herbals, vitamins, non-steroidal anti-inflammatory drugs (NSAIDs) and supplements.  This website has more information on Eliquis (apixaban): http://www.eliquis.com/eliquis/home =======================================  Atrial Fibrillation    Atrial fibrillation is a type of heartbeat that is irregular or fast. If you have this condition, your heart beats without any order. This makes it hard for your heart to pump blood in a normal way. Atrial fibrillation may come and go, or it may become a long-lasting problem. If this condition is not treated, it can put you at higher risk for stroke, heart failure, and other heart problems.  What are the causes? This condition may be caused by diseases that damage the heart. They include: High blood pressure. Heart failure. Heart valve disease. Heart surgery. Other causes include: Diabetes. Thyroid disease. Being overweight. Kidney disease. Sometimes the cause is not known.  What increases the risk? You are more likely to develop this condition if: You are older. You smoke. You exercise often and very hard. You have a family history of this condition. You are a man. You use drugs. You drink a lot of alcohol. You have lung conditions, such as emphysema, pneumonia, or COPD. You have sleep apnea.  What are the signs or symptoms? Common symptoms of this condition include: A feeling that your heart is beating very fast. Chest pain or discomfort. Feeling short of breath. Suddenly feeling  light-headed or weak. Getting tired easily during activity. Fainting. Sweating. In some cases, there are no symptoms.  How is this treated? Treatment for this condition depends on underlying conditions and how you feel when you have atrial fibrillation. They include: Medicines to: Prevent  blood clots. Treat heart rate or heart rhythm problems. Using devices, such as a pacemaker, to correct heart rhythm problems. Doing surgery to remove the part of the heart that sends bad signals. Closing an area where clots can form in the heart (left atrial appendage). In some cases, your doctor will treat other underlying conditions.  Follow these instructions at home:  Medicines Take over-the-counter and prescription medicines only as told by your doctor. Do not take any new medicines without first talking to your doctor. If you are taking blood thinners: Talk with your doctor before you take any medicines that have aspirin or NSAIDs, such as ibuprofen, in them. Take your medicine exactly as told by your doctor. Take it at the same time each day. Avoid activities that could hurt or bruise you. Follow instructions about how to prevent falls. Wear a bracelet that says you are taking blood thinners. Or, carry a card that lists what medicines you take. Lifestyle         Do not use any products that have nicotine or tobacco in them. These include cigarettes, e-cigarettes, and chewing tobacco. If you need help quitting, ask your doctor. Eat heart-healthy foods. Talk with your doctor about the right eating plan for you. Exercise regularly as told by your doctor. Do not drink alcohol. Lose weight if you are overweight. Do not use drugs, including cannabis.  General instructions If you have a condition that causes breathing to stop for a short period of time (apnea), treat it as told by your doctor. Keep a healthy weight. Do not use diet pills unless your doctor says they are safe for you. Diet pills may make heart problems worse. Keep all follow-up visits as told by your doctor. This is important.  Contact a doctor if: You notice a change in the speed, rhythm, or strength of your heartbeat. You are taking a blood-thinning medicine and you get more bruising. You get tired more easily  when you move or exercise. You have a sudden change in weight.  Get help right away if:    You have pain in your chest or your belly (abdomen). You have trouble breathing. You have side effects of blood thinners, such as blood in your vomit, poop (stool), or pee (urine), or bleeding that cannot stop. You have any signs of a stroke. "BE FAST" is an easy way to remember the main warning signs: B - Balance. Signs are dizziness, sudden trouble walking, or loss of balance. E - Eyes. Signs are trouble seeing or a change in how you see. F - Face. Signs are sudden weakness or loss of feeling in the face, or the face or eyelid drooping on one side. A - Arms. Signs are weakness or loss of feeling in an arm. This happens suddenly and usually on one side of the body. S - Speech. Signs are sudden trouble speaking, slurred speech, or trouble understanding what people say. T - Time. Time to call emergency services. Write down what time symptoms started. You have other signs of a stroke, such as: A sudden, very bad headache with no known cause. Feeling like you may vomit (nausea). Vomiting. A seizure.  These symptoms may be an emergency. Do not wait to  see if the symptoms will go away. Get medical help right away. Call your local emergency services (911 in the [place] Do not drive yourself to the hospital. Summary Atrial fibrillation is a type of heartbeat that is irregular or fast. You are at higher risk of this condition if you smoke, are older, have diabetes, or are overweight. Follow your doctor's instructions about medicines, diet, exercise, and follow-up visits. Get help right away if you have signs or symptoms of a stroke. Get help right away if you cannot catch your breath, or you have chest pain or discomfort. This information is not intended to replace advice given to you by your health care provider. Make sure you discuss any questions you have with your health care provider. Document  Revised: 11/17/2018 Document Reviewed: 11/17/2018 Elsevier Patient Education  2020 [employer].
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
## 2023-07-07 NOTE — TOC Initial Note (Signed)
Transition of Care [facility]) - Initial/Assessment Note    Patient Details  Name: Ronald [name] MRN: [id number] Date of Birth: [birth year]  [facility] CM/SW Contact:    Delilah Shan, LCSWA Phone Number: 07/07/2023, 5:28 PM  Clinical Narrative:                  CSW received consult for possible SNF placement at time of discharge. Due to patients current orientation CSW spoke with patients son Ronald [name]. regarding PT recommendation of SNF placement for patient at time of discharge. Patients son reports PTA patient comes from home with ex spouse.Patients son expressed understanding of PT recommendation and is agreeable to SNF placement for patient at time of discharge. Patients son gave CSW permission to fax out initial referral for possible SNF placement for patient.CSW discussed insurance authorization process .No further questions reported at this time. CSW to continue to follow and assist with discharge planning needs.    Expected Discharge Plan: Skilled Nursing Facility Barriers to Discharge: Continued Medical Work up   Patient Goals and CMS Choice     Choice offered to / list presented to : Adult Children (patients son)      Expected Discharge Plan and Services In-house Referral: Clinical Social Work     Living arrangements for the past 2 months: Single Family Home                                      Prior Living Arrangements/Services Living arrangements for the past 2 months: Single Family Home Lives with::  (ex spouse) Patient language and need for interpreter reviewed:: Yes        Need for Family Participation in Patient Care: Yes (Comment) Care giver support system in place?: Yes (comment)   Criminal Activity/Legal Involvement Pertinent to Current Situation/Hospitalization: No - Comment as needed  Activities of Daily Living   ADL Screening (condition at time of admission) Independently performs ADLs?: Yes (appropriate for developmental age) Is  the patient deaf or have difficulty hearing?: No Does the patient have difficulty seeing, even when wearing glasses/contacts?: No Does the patient have difficulty concentrating, remembering, or making decisions?: No  Permission Sought/Granted Permission sought to share information with : Case Manager, Magazine features editor, Family Supports                Emotional Assessment       Orientation: : Oriented to Self Alcohol / Substance Use: Not Applicable Psych Involvement: No (comment)  Admission diagnosis:  CHF (congestive heart failure) (HCC) [I50.9] NSTEMI (non-ST elevated myocardial infarction) (HCC) [I21.4] Toxic encephalopathy [G92.9] Encephalopathy [G93.40] Patient Active Problem List   Diagnosis Date Noted   Toxic encephalopathy 07/05/2023   Nonischemic nontraumatic myocardial injury 07/05/2023   Hematuria 07/05/2023   Acute pulmonary embolism (HCC) 07/05/2023   CKD (chronic kidney disease) 07/05/2023   Elevated alkaline phosphatase level 07/05/2023   PAF (paroxysmal atrial fibrillation) (HCC) 07/05/2023   Encephalopathy 07/05/2023   Acute renal failure superimposed on stage 3b chronic kidney disease (HCC) 06/07/2023   COPD exacerbation (HCC) 06/07/2023   Acute kidney injury superimposed on chronic kidney disease (HCC) 06/06/2023   COPD without exacerbation (HCC) 07/07/2022   COPD with acute exacerbation (HCC) 12/07/2021   Prolonged QT interval 11/23/2021   Acute exacerbation of CHF (congestive heart failure) (HCC) 11/23/2021   Acute on chronic diastolic CHF (congestive heart failure) (HCC) 11/22/2021   Non-ST  elevation (NSTEMI) myocardial infarction (HCC) 09/01/2021   Acute respiratory failure (HCC) 07/10/2021   Pulmonary nodules 07/10/2021   Malnutrition of moderate degree 05/13/2021   Atrial flutter (HCC) 05/01/2021   Shock circulatory (HCC) 05/01/2021   Elevated troponin 12/21/2020   Hypokalemia 12/21/2020   HLD (hyperlipidemia) 12/21/2020   Acute  on chronic respiratory failure with hypoxia (HCC) 12/21/2020   Chest pain 05/23/2020   Controlled substance agreement signed 04/03/2020   Benzodiazepine dependence (HCC) 04/03/2020   Dysphagia 02/27/2020   Tracheal stenosis 12/09/2019   Lymphedema 04/01/2019   CHF (congestive heart failure) (HCC) 08/27/2018   Aortic atherosclerosis (HCC) 05/11/2017   Depression 05/05/2017   GERD (gastroesophageal reflux disease) 01/16/2017   Diabetic neuropathy (HCC) 01/16/2017   Anterolisthesis 12/04/2016   History of MI (myocardial infarction) 12/04/2016   OSA (obstructive sleep apnea) 12/04/2016   Closed fracture dislocation of lumbar spine (HCC) 07/25/2016   Closed fracture of body of sternum 07/25/2016   Multiple fractures of cervical spine (HCC) 07/25/2016   Anxiety 02/14/2016   Chronic respiratory failure with hypoxia (HCC) 07/19/2014   HTN (hypertension) 07/19/2014   Cigarette smoker 07/19/2014   Coronary artery disease 06/22/2014   DM (diabetes mellitus), type 2 (HCC) 06/22/2014   Tobacco abuse 06/22/2014   Acute encephalopathy 06/22/2014   COPD (chronic obstructive pulmonary disease) with emphysema (HCC) 06/22/2014   PCP:  [name], FNP Pharmacy:   [facility] [address] [place] - [address] [place] [address] Phone: [phone] Fax: [phone]  [facility] - [place], [place] - [address] [place] [address] Phone: [phone] Fax: [phone]     Social Drivers of Health (SDOH) Social History: SDOH Screenings   Food Insecurity: No Food Insecurity (07/05/2023)  Housing: Low Risk  (07/05/2023)  Transportation Needs: Unmet Transportation Needs (07/05/2023)  Utilities: Not At Risk (07/05/2023)  Alcohol Screen: Low Risk  (02/04/2023)  Depression (PHQ2-9): Low Risk  (02/04/2023)  Financial Resource Strain: Low Risk  (02/04/2023)  Physical Activity: Inactive (02/04/2023)  Social Connections: Unknown (07/05/2023)  Recent Concern:  Social Connections - Socially Isolated (06/09/2023)  Stress: No Stress Concern Present (02/04/2023)  Tobacco Use: High Risk (07/05/2023)  Health Literacy: Adequate Health Literacy (02/04/2023)   SDOH Interventions:     Readmission Risk Interventions    06/08/2023    2:52 PM 12/09/2021    8:42 AM 11/25/2021    1:34 PM  Readmission Risk Prevention Plan  Transportation Screening Complete Complete Complete  HRI or Home Care Consult Complete    Social Work Consult for Recovery Care Planning/Counseling Complete    Palliative Care Screening Not Applicable    Medication Review Oceanographer) Complete Complete Complete  PCP or Specialist appointment within 3-5 days of discharge   Not Complete  HRI or Home Care Consult  Complete Complete  SW Recovery Care/Counseling Consult  Complete Complete  Palliative Care Screening  Not Applicable Not Applicable  Skilled Nursing Facility  Not Applicable Not Applicable

## 2023-07-07 NOTE — Progress Notes (Signed)
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 normal in size with greater than 50% respiratory variability, suggesting right atrial pressure of 3 mmHg. FINDINGS  Left Ventricle: Left ventricular ejection fraction, by estimation, is 55 to 60%. The left ventricle has normal function. The left ventricle has no regional wall motion abnormalities. The left ventricular internal cavity size was normal in size. There is  no left ventricular hypertrophy. Left ventricular diastolic function could not be evaluated due to indeterminate diastolic function. Left ventricular diastolic function could not be evaluated. Right Ventricle: The right ventricular size is normal. No increase in right ventricular wall thickness. Right ventricular systolic function is normal. There is mildly elevated  pulmonary artery systolic pressure. The tricuspid regurgitant velocity is 2.67  m/s, and with an assumed right atrial pressure of 8 mmHg, the estimated right ventricular systolic pressure is 36.5 mmHg. Left Atrium: Left atrial size was normal in size. Right Atrium: Right atrial size was normal in size. Pericardium: Trivial pericardial effusion is present. Mitral Valve: The mitral valve is normal in structure. No evidence of mitral valve regurgitation. No evidence of mitral valve stenosis. Tricuspid Valve: The tricuspid valve is normal in structure. Tricuspid valve regurgitation is not demonstrated. No evidence of tricuspid stenosis. Aortic Valve: The aortic valve is normal in structure. There is mild calcification of the aortic valve. Aortic valve regurgitation is not visualized. No aortic stenosis is present. Pulmonic Valve: The pulmonic valve was normal in structure. Pulmonic valve regurgitation is not visualized. No evidence of pulmonic stenosis. Aorta: Aortic dilatation noted. There is dilatation of the ascending aorta, measuring 42 mm. Venous: The inferior vena cava is normal in size with greater than 50% respiratory variability, suggesting right atrial pressure of 3 mmHg. IAS/Shunts: No atrial level shunt detected by color flow Doppler.  LEFT VENTRICLE PLAX 2D LVIDd:  5.30 cm     Diastology LVIDs:         3.70 cm     LV e' medial:    3.59 cm/s LV PW:         1.00 cm     LV E/e' medial:  17.6 LV IVS:        0.90 cm     LV e' lateral:   4.35 cm/s LVOT diam:     2.30 cm     LV E/e' lateral: 14.5 LV SV:         98 LV SV Index:   53 LVOT Area:     4.15 cm  LV Volumes (MOD) LV vol d, MOD A2C: 92.6 ml LV vol d, MOD A4C: 80.1 ml LV vol s, MOD A2C: 30.8 ml LV vol s, MOD A4C: 38.8 ml LV SV MOD A2C:     61.8 ml LV SV MOD A4C:     80.1 ml LV SV MOD BP:      49.2 ml RIGHT VENTRICLE             IVC RV S prime:     10.80 cm/s  IVC diam: 1.40 cm TAPSE (M-mode): 2.1 cm LEFT ATRIUM           Index       RIGHT ATRIUM            Index LA diam:      2.80 cm 1.52 cm/m  RA Area:     12.00 cm LA Vol (A2C): 15.1 ml 8.20 ml/m  RA Volume:   26.30 ml  14.28 ml/m LA Vol (A4C): 16.6 ml 9.01 ml/m  AORTIC VALVE LVOT Vmax:   118.00 cm/s LVOT Vmean:  69.100 cm/s LVOT VTI:    0.235 m  AORTA Ao Root diam: 3.60 cm Ao Asc diam:  4.15 cm MITRAL VALVE                TRICUSPID VALVE MV Area (PHT): 2.32 cm     TR Peak grad:   28.5 mmHg MV Decel Time: 327 msec     TR Vmax:        267.00 cm/s MV E velocity: 63.10 cm/s MV A velocity: 105.00 cm/s  SHUNTS MV E/A ratio:  0.60         Systemic VTI:  0.24 m                             Systemic Diam: 2.30 cm Aditya Sabharwal Electronically signed by Dorthula Nettles Signature Date/Time: 07/06/2023/4:45:22 PM    Final    CT HEAD WO CONTRAST ( ) Result Date: 07/05/2023 CLINICAL DATA:  Mental status change, unknown cause EXAM: CT HEAD WITHOUT CONTRAST TECHNIQUE: Contiguous axial images were obtained from the base of the skull through the vertex without intravenous contrast. RADIATION DOSE REDUCTION: This exam was performed according to the departmental dose-optimization program which includes automated exposure control, adjustment of the mA and/or kV according to patient size and/or use of iterative reconstruction technique. COMPARISON:  CT head 07/04/2023. FINDINGS: Brain: No evidence of acute large vascular territory infarction, hemorrhage, hydrocephalus, extra-axial collection or mass lesion/mass effect. Moderate patchy white matter hypodensities are nonspecific but compatible with chronic microvascular ischemic disease. Vascular: No hyperdense vessel. Skull: No acute fracture. Sinuses/Orbits: Clear sinuses.  No acute orbital findings. Other: No mastoid effusions. IMPRESSION: No evidence of acute intracranial abnormality. Electronically Signed   By: Thornton Dales  Yetta Barre M.D.   On: 07/05/2023 23:45   Scheduled Meds:  amiodarone  200 mg Oral Daily   amLODipine  10 mg Oral Daily   apixaban  5 mg Oral BID    aspirin EC  81 mg Oral Daily   bisoprolol  5 mg Oral Daily   fluticasone furoate-vilanterol  1 puff Inhalation Daily   folic acid  1 mg Oral Daily   [START ON 07/08/2023] furosemide  20 mg Oral Daily   insulin aspart  0-5 Units Subcutaneous QHS   insulin aspart  0-9 Units Subcutaneous TID WC   isosorbide mononitrate  30 mg Oral Daily   multivitamin with minerals  1 tablet Oral Daily   pantoprazole  40 mg Oral Daily   rosuvastatin  20 mg Oral Daily   thiamine  100 mg Oral Daily   Or   thiamine  100 mg Intravenous Daily   triamcinolone  1 spray Each Nare Daily   umeclidinium bromide  1 puff Inhalation Daily   Continuous Infusions:  thiamine (VITAMIN B1) injection 500 mg (07/07/23 2041)    LOS: 2 days   Marguerita Merles, DO [facility] Available via Epic secure chat 7am-7pm After these hours, please refer to coverage provider listed on [email or web address] 07/07/2023, 9:18 PM

## 2023-07-08 ENCOUNTER — Other Ambulatory Visit (HOSPITAL_COMMUNITY): Payer: Self-pay

## 2023-07-08 ENCOUNTER — Telehealth (HOSPITAL_COMMUNITY): Payer: Self-pay | Admitting: Pharmacy Technician

## 2023-07-08 ENCOUNTER — Inpatient Hospital Stay (HOSPITAL_COMMUNITY): Payer: Medicare HMO

## 2023-07-08 DIAGNOSIS — G929 Unspecified toxic encephalopathy: Secondary | ICD-10-CM | POA: Diagnosis not present

## 2023-07-08 DIAGNOSIS — I5033 Acute on chronic diastolic (congestive) heart failure: Secondary | ICD-10-CM | POA: Diagnosis not present

## 2023-07-08 DIAGNOSIS — J9621 Acute and chronic respiratory failure with hypoxia: Secondary | ICD-10-CM | POA: Diagnosis not present

## 2023-07-08 DIAGNOSIS — I5A Non-ischemic myocardial injury (non-traumatic): Secondary | ICD-10-CM | POA: Diagnosis not present

## 2023-07-08 LAB — CBC WITH DIFFERENTIAL/PLATELET
Abs Immature Granulocytes: 0.04 10*3/uL (ref 0.00–0.07)
Basophils Absolute: 0 10*3/uL (ref 0.0–0.1)
Basophils Relative: 0 %
Eosinophils Absolute: 0.2 10*3/uL (ref 0.0–0.5)
Eosinophils Relative: 2 %
HCT: 35.9 % — ABNORMAL LOW (ref 39.0–52.0)
Hemoglobin: 11.6 g/dL — ABNORMAL LOW (ref 13.0–17.0)
Immature Granulocytes: 1 %
Lymphocytes Relative: 13 %
Lymphs Abs: 1.1 10*3/uL (ref 0.7–4.0)
MCH: 27.6 pg (ref 26.0–34.0)
MCHC: 32.3 g/dL (ref 30.0–36.0)
MCV: 85.3 fL (ref 80.0–100.0)
Monocytes Absolute: 1.1 10*3/uL — ABNORMAL HIGH (ref 0.1–1.0)
Monocytes Relative: 13 %
Neutro Abs: 6 10*3/uL (ref 1.7–7.7)
Neutrophils Relative %: 71 %
Platelets: 365 10*3/uL (ref 150–400)
RBC: 4.21 MIL/uL — ABNORMAL LOW (ref 4.22–5.81)
RDW: 15.7 % — ABNORMAL HIGH (ref 11.5–15.5)
WBC: 8.4 10*3/uL (ref 4.0–10.5)
nRBC: 0 % (ref 0.0–0.2)

## 2023-07-08 LAB — RETICULOCYTES
Immature Retic Fract: 15.5 % (ref 2.3–15.9)
RBC.: 4.22 MIL/uL (ref 4.22–5.81)
Retic Count, Absolute: 108.9 10*3/uL (ref 19.0–186.0)
Retic Ct Pct: 2.6 % (ref 0.4–3.1)

## 2023-07-08 LAB — URINE CULTURE: Culture: >=100000 — AB

## 2023-07-08 LAB — PROTIME-INR
INR: 1.3 — ABNORMAL HIGH (ref 0.8–1.2)
Prothrombin Time: 16.2 s — ABNORMAL HIGH (ref 11.4–15.2)

## 2023-07-08 LAB — COMPREHENSIVE METABOLIC PANEL
ALT: 16 U/L (ref 0–44)
AST: 12 U/L — ABNORMAL LOW (ref 15–41)
Albumin: 2.3 g/dL — ABNORMAL LOW (ref 3.5–5.0)
Alkaline Phosphatase: 108 U/L (ref 38–126)
Anion gap: 10 (ref 5–15)
BUN: 26 mg/dL — ABNORMAL HIGH (ref 8–23)
CO2: 27 mmol/L (ref 22–32)
Calcium: 8.4 mg/dL — ABNORMAL LOW (ref 8.9–10.3)
Chloride: 100 mmol/L (ref 98–111)
Creatinine, Ser: 1.85 mg/dL — ABNORMAL HIGH (ref 0.61–1.24)
GFR, Estimated: 38 mL/min — ABNORMAL LOW (ref >60)
Glucose, Bld: 109 mg/dL — ABNORMAL HIGH (ref 70–99)
Potassium: 3.5 mmol/L (ref 3.5–5.1)
Sodium: 137 mmol/L (ref 135–145)
Total Bilirubin: 0.9 mg/dL (ref 0.0–1.2)
Total Protein: 5.1 g/dL — ABNORMAL LOW (ref 6.5–8.1)

## 2023-07-08 LAB — GLUCOSE, CAPILLARY
Glucose-Capillary: 215 mg/dL — ABNORMAL HIGH (ref 70–99)
Glucose-Capillary: 134 mg/dL — ABNORMAL HIGH (ref 70–99)
Glucose-Capillary: 137 mg/dL — ABNORMAL HIGH (ref 70–99)
Glucose-Capillary: 126 mg/dL — ABNORMAL HIGH (ref 70–99)

## 2023-07-08 LAB — MAGNESIUM: Magnesium: 2 mg/dL (ref 1.7–2.4)

## 2023-07-08 LAB — TROPONIN I (HIGH SENSITIVITY): Troponin I (High Sensitivity): 45 ng/L — ABNORMAL HIGH (ref <18)

## 2023-07-08 LAB — IRON AND TIBC
Iron: 39 ug/dL — ABNORMAL LOW (ref 45–182)
Saturation Ratios: 17 % — ABNORMAL LOW (ref 17.9–39.5)
TIBC: 234 ug/dL — ABNORMAL LOW (ref 250–450)
UIBC: 195 ug/dL

## 2023-07-08 LAB — VITAMIN B12: Vitamin B-12: 234 pg/mL (ref 180–914)

## 2023-07-08 LAB — FOLATE: Folate: 17.6 ng/mL (ref >5.9)

## 2023-07-08 LAB — FERRITIN: Ferritin: 78 ng/mL (ref 24–336)

## 2023-07-08 LAB — PHOSPHORUS: Phosphorus: 3.1 mg/dL (ref 2.5–4.6)

## 2023-07-08 NOTE — Telephone Encounter (Signed)
Patient Product/process development scientist completed.    The patient is insured through [employer]. Patient has Medicare and is not eligible for a copay card, but may be able to apply for patient assistance or Medicare RX Payment Plan (Patient Must reach out to their plan, if eligible for payment plan), if available.    Ran test claim for Eliquis 5 mg and the current 30 day co-pay is $340.21 due to a $250.00.  Ran test claim for Xarelto 20 mg and the current 30 day co-pay is $338.13 due to a $250.00.  This test claim was processed through [facility]- copay amounts may vary at other pharmacies due to pharmacy/plan contracts, or as the patient moves through the different stages of their insurance plan.     Roland Earl, CPHT Pharmacy Technician III Certified Patient Advocate [facility] Patient Advocate Team Direct Number: [phone]  Fax: [phone]

## 2023-07-08 NOTE — Plan of Care (Signed)
Problem: Health Behavior/Discharge Planning: Goal: Ability to manage health-related needs will improve Outcome: Progressing

## 2023-07-08 NOTE — TOC Progression Note (Addendum)
Transition of Care [facility]) - Progression Note    Patient Details  Name: Ronald [name] MRN: [id number] Date of Birth: Aug 31, 1945  Transition of Care [facility]) CM/SW Contact  Delilah Shan, LCSWA Phone Number: 07/08/2023, 3:39 PM  Clinical Narrative:     CSW spoke with patient at bedside regarding PT recommendations.Patient reports he comes from home with ex-spouse. CSW discussed PT recommendations for SNF placement for patient at time of discharge. .CSW informed patient that CSW initially spoke with his son Dorrance Sellick. Who was in agreement for patient to get some rehab at SNF, and his concerns of him returning back home with limited support., Patient expressed understanding of PT recommendations and declined SNF. Patient confirmed his plan is  to return home when medically ready for dc.  Patient would like to receive HHPT at home.Patient would like CM to follow up to discuss Home needs.CSW informed patient that CM will follow up to discuss home needs. No further questions reported at this time.CSW informed patients son. CSW to continue to follow and assist with discharge planning needs.   Expected Discharge Plan: Skilled Nursing Facility Barriers to Discharge: Continued Medical Work up  Expected Discharge Plan and Services In-house Referral: Clinical Social Work     Living arrangements for the past 2 months: Single Family Home                                       Social Determinants of Health (SDOH) Interventions SDOH Screenings   Food Insecurity: No Food Insecurity (07/05/2023)  Housing: Low Risk  (07/05/2023)  Transportation Needs: Unmet Transportation Needs (07/05/2023)  Utilities: Not At Risk (07/05/2023)  Alcohol Screen: Low Risk  (02/04/2023)  Depression (PHQ2-9): Low Risk  (02/04/2023)  Financial Resource Strain: Low Risk  (02/04/2023)  Physical Activity: Inactive (02/04/2023)  Social Connections: Unknown (07/05/2023)  Recent Concern: Social Connections - Socially Isolated  (06/09/2023)  Stress: No Stress Concern Present (02/04/2023)  Tobacco Use: High Risk (07/05/2023)  Health Literacy: Adequate Health Literacy (02/04/2023)    Readmission Risk Interventions    06/08/2023    2:52 PM 12/09/2021    8:42 AM 11/25/2021    1:34 PM  Readmission Risk Prevention Plan  Transportation Screening Complete Complete Complete  HRI or Home Care Consult Complete    Social Work Consult for Recovery Care Planning/Counseling Complete    Palliative Care Screening Not Applicable    Medication Review Oceanographer) Complete Complete Complete  PCP or Specialist appointment within 3-5 days of discharge   Not Complete  HRI or Home Care Consult  Complete Complete  SW Recovery Care/Counseling Consult  Complete Complete  Palliative Care Screening  Not Applicable Not Applicable  Skilled Nursing Facility  Not Applicable Not Applicable

## 2023-07-08 NOTE — Progress Notes (Signed)
Mobility Specialist Progress Note:    07/08/23 1538  Mobility  Activity Ambulated with assistance in room  Level of Assistance Minimal assist, patient does 75% or more (+2)  Assistive Device Front wheel walker  Distance Ambulated (ft) 10 ft  LUE Weight Bearing Per Provider Order NWB  LLE Weight Bearing Per Provider Order WBAT  Activity Response Tolerated well  Mobility Referral Yes  Mobility visit 1 Mobility  Mobility Specialist Start Time (ACUTE ONLY) 1455  Mobility Specialist Stop Time (ACUTE ONLY) 1505  Mobility Specialist Time Calculation (min) (ACUTE ONLY) 10 min   RN requesting assistance getting pt up. Pt needed MinA +2 w/ STS and contact guard to ambulate towards the sink. Pt situated in chair in front of sink. All needs met, call bell in reach. RN in room.  Thompson Grayer Mobility Specialist  Please contact vis Secure Chat or  Rehab Office [phone]
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
## 2023-07-09 DIAGNOSIS — G9341 Metabolic encephalopathy: Secondary | ICD-10-CM | POA: Diagnosis not present

## 2023-07-09 DIAGNOSIS — I480 Paroxysmal atrial fibrillation: Secondary | ICD-10-CM | POA: Diagnosis not present

## 2023-07-09 DIAGNOSIS — I5033 Acute on chronic diastolic (congestive) heart failure: Secondary | ICD-10-CM | POA: Diagnosis not present

## 2023-07-09 LAB — BASIC METABOLIC PANEL
Anion gap: 8 (ref 5–15)
BUN: 26 mg/dL — ABNORMAL HIGH (ref 8–23)
CO2: 28 mmol/L (ref 22–32)
Calcium: 8.7 mg/dL — ABNORMAL LOW (ref 8.9–10.3)
Chloride: 100 mmol/L (ref 98–111)
Creatinine, Ser: 1.7 mg/dL — ABNORMAL HIGH (ref 0.61–1.24)
GFR, Estimated: 42 mL/min — ABNORMAL LOW (ref >60)
Glucose, Bld: 114 mg/dL — ABNORMAL HIGH (ref 70–99)
Potassium: 3.7 mmol/L (ref 3.5–5.1)
Sodium: 136 mmol/L (ref 135–145)

## 2023-07-09 LAB — GLUCOSE, CAPILLARY
Glucose-Capillary: 205 mg/dL — ABNORMAL HIGH (ref 70–99)
Glucose-Capillary: 122 mg/dL — ABNORMAL HIGH (ref 70–99)
Glucose-Capillary: 178 mg/dL — ABNORMAL HIGH (ref 70–99)
Glucose-Capillary: 113 mg/dL — ABNORMAL HIGH (ref 70–99)

## 2023-07-09 MED ORDER — ACETAMINOPHEN 500 MG PO TABS
1000.0000 mg | ORAL_TABLET | Freq: Four times a day (QID) | ORAL | Status: DC
  Administered 2023-07-09 – 2023-07-15 (×19): 1000 mg via ORAL
  Filled 2023-07-09 (×22): qty 2

## 2023-07-09 MED ORDER — POLYETHYLENE GLYCOL 3350 17 G PO PACK
17.0000 g | PACK | Freq: Every day | ORAL | Status: DC
  Administered 2023-07-09 – 2023-07-15 (×6): 17 g via ORAL
  Filled 2023-07-09 (×7): qty 1

## 2023-07-09 MED ORDER — HYDROCODONE-ACETAMINOPHEN 7.5-325 MG PO TABS
1.0000 | ORAL_TABLET | Freq: Four times a day (QID) | ORAL | Status: DC | PRN
  Administered 2023-07-09 (×2): 1 via ORAL
  Filled 2023-07-09 (×2): qty 1

## 2023-07-09 MED ORDER — ORAL CARE MOUTH RINSE: 15.0000 mL | OROMUCOSAL | Status: DC | PRN

## 2023-07-09 MED ORDER — SENNOSIDES-DOCUSATE SODIUM 8.6-50 MG PO TABS
1.0000 | ORAL_TABLET | Freq: Two times a day (BID) | ORAL | Status: DC
  Administered 2023-07-09 – 2023-07-15 (×10): 1 via ORAL
  Filled 2023-07-09 (×11): qty 1

## 2023-07-09 MED ORDER — OXYCODONE HCL 5 MG PO TABS
5.0000 mg | ORAL_TABLET | ORAL | Status: DC | PRN
  Administered 2023-07-10 – 2023-07-14 (×6): 5 mg via ORAL
  Filled 2023-07-09 (×6): qty 1

## 2023-07-09 MED ORDER — PROCHLORPERAZINE EDISYLATE 10 MG/2ML IJ SOLN
10.0000 mg | Freq: Four times a day (QID) | INTRAMUSCULAR | Status: DC | PRN
  Administered 2023-07-09: 10 mg via INTRAVENOUS
  Filled 2023-07-09: qty 2
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
## 2023-07-09 NOTE — Progress Notes (Signed)
CSW received consult for patient. CSW spoke with patient at bedside. CSW offered patient outpatient substance use treatment services resources. Patient accepted. All questions answered. No further questions reported at this time.

## 2023-07-09 NOTE — Progress Notes (Signed)
Patient is complaining about left-sided upper extremity pain due to left-sided upper extremity distal radius and ulnar fracture.  Pain is not remitting with Tylenol.  -Ordered Norco as needed for moderate and severe pain control.
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
## 2023-07-09 NOTE — Plan of Care (Signed)
Problem: Health Behavior/Discharge Planning: Goal: Ability to manage health-related needs will improve Outcome: Progressing

## 2023-07-09 NOTE — TOC Progression Note (Addendum)
Transition of Care [facility]) - Progression Note    Patient Details  Name: Ronald [name] MRN: [id number] Date of Birth: 05-16-1946  [facility]) CM/SW Contact  Graves-Bigelow, Lamar Laundry, RN Phone Number: 07/09/2023, 4:00 PM  Clinical Narrative: Case Manager received a consult from the doctor for home with home health services. PT/OT recommendations have been for SNF and the patient is declining SNF at this time. Patient is only ambulating 10 ft. Patient will have to work with PT/OT more before he can transition home safely alone. Case Manager did speak with son Ronald [name] and he states the patient will be home alone. States his ex-wife is unable to provide care since she is hospitalized. Patient is currently a 2 person assist. Patient is currently active with [facility] RN/PT/OT- Case Manager spoke with Liaison to confirm INR checks in the home and to call results to the office. Office is agreeable to Lab draws and MD is aware to place orders and add PCP to call results to in the comment section. Iantha Fallen will stagger care if the plan continues to be home so the patient can be seen more during the week. Patient will have to have ambulance transport home once stable to transition home. Case Manager calling [facility] to confirm oxygen in the home and liter flow. Case Manager will continue to follow for additional needs.   1621 07-09-23 Case Manager confirmed with [facility] that the patient currently has oxygen at home for 2 liters continuous and has a 5 liter concentrator and portable tanks. Case Manager will continue to follow for additional needs.   Expected Discharge Plan: Skilled Nursing Facility Barriers to Discharge: Continued Medical Work up  Expected Discharge Plan and Services In-house Referral: Clinical Social Work     Living arrangements for the past 2 months: Single Family Home  Social Determinants of Health (SDOH) Interventions SDOH Screenings   Food Insecurity: No  Food Insecurity (07/05/2023)  Housing: Low Risk  (07/05/2023)  Transportation Needs: Unmet Transportation Needs (07/05/2023)  Utilities: Not At Risk (07/05/2023)  Alcohol Screen: Low Risk  (02/04/2023)  Depression (PHQ2-9): Low Risk  (02/04/2023)  Financial Resource Strain: Low Risk  (02/04/2023)  Physical Activity: Inactive (02/04/2023)  Social Connections: Unknown (07/05/2023)  Recent Concern: Social Connections - Socially Isolated (06/09/2023)  Stress: No Stress Concern Present (02/04/2023)  Tobacco Use: High Risk (07/05/2023)  Health Literacy: Adequate Health Literacy (02/04/2023)    Readmission Risk Interventions    06/08/2023    2:52 PM 12/09/2021    8:42 AM 11/25/2021    1:34 PM  Readmission Risk Prevention Plan  Transportation Screening Complete Complete Complete  HRI or Home Care Consult Complete    Social Work Consult for Recovery Care Planning/Counseling Complete    Palliative Care Screening Not Applicable    Medication Review Oceanographer) Complete Complete Complete  PCP or Specialist appointment within 3-5 days of discharge   Not Complete  HRI or Home Care Consult  Complete Complete  SW Recovery Care/Counseling Consult  Complete Complete  Palliative Care Screening  Not Applicable Not Applicable  Skilled Nursing Facility  Not Applicable Not Applicable

## 2023-07-09 NOTE — Plan of Care (Signed)
  Problem: Clinical Measurements: Goal: Respiratory complications will improve Outcome: Progressing Goal: Cardiovascular complication will be avoided Outcome: Progressing   Problem: Safety: Goal: Ability to remain free from injury will improve Outcome: Progressing   Problem: Cardiac: Goal: Ability to achieve and maintain adequate cardiopulmonary perfusion will improve Outcome: Progressing   Problem: Metabolic: Goal: Ability to maintain appropriate glucose levels will improve Outcome: Progressing

## 2023-07-10 DIAGNOSIS — I5033 Acute on chronic diastolic (congestive) heart failure: Secondary | ICD-10-CM | POA: Diagnosis not present

## 2023-07-10 DIAGNOSIS — I5A Non-ischemic myocardial injury (non-traumatic): Secondary | ICD-10-CM | POA: Diagnosis not present

## 2023-07-10 DIAGNOSIS — G9341 Metabolic encephalopathy: Secondary | ICD-10-CM | POA: Diagnosis not present

## 2023-07-10 LAB — GLUCOSE, CAPILLARY
Glucose-Capillary: 104 mg/dL — ABNORMAL HIGH (ref 70–99)
Glucose-Capillary: 144 mg/dL — ABNORMAL HIGH (ref 70–99)
Glucose-Capillary: 180 mg/dL — ABNORMAL HIGH (ref 70–99)
Glucose-Capillary: 102 mg/dL — ABNORMAL HIGH (ref 70–99)

## 2023-07-10 MED ORDER — INFLUENZA VAC A&B SURF ANT ADJ 0.5 ML IM SUSY
0.5000 mL | PREFILLED_SYRINGE | INTRAMUSCULAR | Status: AC
  Administered 2023-07-11: 0.5 mL via INTRAMUSCULAR
  Filled 2023-07-10: qty 0.5
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
## 2023-07-10 NOTE — TOC Progression Note (Signed)
Transition of Care [facility]) - Progression Note    Patient Details  Name: Ronald [name] MRN: [id number] Date of Birth: 1945-12-30  [facility]) CM/SW Contact  Carmello Cabiness A [place], [place] Phone Number: 07/10/2023, 2:50 PM  Clinical Narrative:     CSW was informed via secure chat that pt requested SNF at discharge. Preference for "[facility]," now [facility].  SNF workup completed. Bed offers pending.    TOC will continue to follow.    Expected Discharge Plan: Skilled Nursing Facility Barriers to Discharge: Continued Medical Work up  Expected Discharge Plan and Services In-house Referral: Clinical Social Work     Living arrangements for the past 2 months: Single Family Home                                       Social Determinants of Health (SDOH) Interventions SDOH Screenings   Food Insecurity: No Food Insecurity (07/05/2023)  Housing: Low Risk  (07/05/2023)  Transportation Needs: Unmet Transportation Needs (07/05/2023)  Utilities: Not At Risk (07/05/2023)  Alcohol Screen: Low Risk  (02/04/2023)  Depression (PHQ2-9): Low Risk  (02/04/2023)  Financial Resource Strain: Low Risk  (02/04/2023)  Physical Activity: Inactive (02/04/2023)  Social Connections: Unknown (07/05/2023)  Recent Concern: Social Connections - Socially Isolated (06/09/2023)  Stress: No Stress Concern Present (02/04/2023)  Tobacco Use: High Risk (07/05/2023)  Health Literacy: Adequate Health Literacy (02/04/2023)    Readmission Risk Interventions    06/08/2023    2:52 PM 12/09/2021    8:42 AM 11/25/2021    1:34 PM  Readmission Risk Prevention Plan  Transportation Screening Complete Complete Complete  HRI or Home Care Consult Complete    Social Work Consult for Recovery Care Planning/Counseling Complete    Palliative Care Screening Not Applicable    Medication Review Oceanographer) Complete Complete Complete  PCP or Specialist appointment within 3-5 days of discharge   Not Complete  HRI or  Home Care Consult  Complete Complete  SW Recovery Care/Counseling Consult  Complete Complete  Palliative Care Screening  Not Applicable Not Applicable  Skilled Nursing Facility  Not Applicable Not Applicable

## 2023-07-10 NOTE — TOC Progression Note (Signed)
Transition of Care [facility]) - Progression Note    Patient Details  Name: Ronald [name] MRN: [id number] Date of Birth: 09/22/1945  Transition of Care [facility]) CM/SW Contact  Graves-Bigelow, Lamar Laundry, RN Phone Number: 07/10/2023, 2:39 PM  Clinical Narrative: Case Manager received notification that the patient is now agreeable to SNF. Case Manager did make [facility] aware that the patient will transition to facility then home if they can continue to follow. CSW to speak with the patient regarding [facility].     Expected Discharge Plan: Skilled Nursing Facility Barriers to Discharge: Continued Medical Work up  Expected Discharge Plan and Services In-house Referral: Clinical Social Work     Living arrangements for the past 2 months: Single Family Home   Social Determinants of Health (SDOH) Interventions SDOH Screenings   Food Insecurity: No Food Insecurity (07/05/2023)  Housing: Low Risk  (07/05/2023)  Transportation Needs: Unmet Transportation Needs (07/05/2023)  Utilities: Not At Risk (07/05/2023)  Alcohol Screen: Low Risk  (02/04/2023)  Depression (PHQ2-9): Low Risk  (02/04/2023)  Financial Resource Strain: Low Risk  (02/04/2023)  Physical Activity: Inactive (02/04/2023)  Social Connections: Unknown (07/05/2023)  Recent Concern: Social Connections - Socially Isolated (06/09/2023)  Stress: No Stress Concern Present (02/04/2023)  Tobacco Use: High Risk (07/05/2023)  Health Literacy: Adequate Health Literacy (02/04/2023)   Readmission Risk Interventions    06/08/2023    2:52 PM 12/09/2021    8:42 AM 11/25/2021    1:34 PM  Readmission Risk Prevention Plan  Transportation Screening Complete Complete Complete  HRI or Home Care Consult Complete    Social Work Consult for Recovery Care Planning/Counseling Complete    Palliative Care Screening Not Applicable    Medication Review Oceanographer) Complete Complete Complete  PCP or Specialist appointment within 3-5 days of discharge   Not  Complete  HRI or Home Care Consult  Complete Complete  SW Recovery Care/Counseling Consult  Complete Complete  Palliative Care Screening  Not Applicable Not Applicable  Skilled Nursing Facility  Not Applicable Not Applicable

## 2023-07-10 NOTE — Plan of Care (Signed)
  Problem: Clinical Measurements: Goal: Respiratory complications will improve Outcome: Progressing Goal: Cardiovascular complication will be avoided Outcome: Progressing   Problem: Pain Managment: Goal: General experience of comfort will improve and/or be controlled Outcome: Progressing   Problem: Safety: Goal: Ability to remain free from injury will improve Outcome: Progressing   Problem: Cardiac: Goal: Ability to achieve and maintain adequate cardiopulmonary perfusion will improve Outcome: Progressing   Problem: Cardiac: Goal: Ability to achieve and maintain adequate cardiopulmonary perfusion will improve Outcome: Progressing

## 2023-07-10 NOTE — [place] FL2 (Signed)
[place] MEDICAID [id number] LEVEL OF CARE FORM     IDENTIFICATION  Patient Name: Ronald [name] Birthdate: August 03, 1945 Sex: male Admission Date (Current Location): 07/05/2023  [place] and [place] Number:  [employer] and Address:  The . [facility], [address] [place], [place] [address]      Provider Number: [id number]  Attending Physician Name and Address:  Lewie Chamber, MD  Relative Name and Phone Number:  Ammar, Moffatt [facility])  [phone]    Current Level of Care: Hospital Recommended Level of Care: Skilled Nursing Facility Prior Approval Number:    Date Approved/Denied:   PASRR Number: [id number]  Discharge Plan: SNF    Current Diagnoses: Patient Active Problem List   Diagnosis Date Noted   Acute metabolic encephalopathy 07/05/2023   Acute pulmonary embolism (HCC) 07/05/2023   Chronic kidney disease, stage 3b (HCC) 07/05/2023   Elevated alkaline phosphatase level 07/05/2023   PAF (paroxysmal atrial fibrillation) (HCC) 07/05/2023   Encephalopathy 07/05/2023   Acute renal failure superimposed on stage 3b chronic kidney disease (HCC) 06/07/2023   COPD exacerbation (HCC) 06/07/2023   Acute kidney injury superimposed on chronic kidney disease (HCC) 06/06/2023   COPD without exacerbation (HCC) 07/07/2022   COPD with acute exacerbation (HCC) 12/07/2021   Prolonged QT interval 11/23/2021   Acute exacerbation of CHF (congestive heart failure) (HCC) 11/23/2021   Non-ST elevation (NSTEMI) myocardial infarction (HCC) 09/01/2021   Acute respiratory failure (HCC) 07/10/2021   Pulmonary nodules 07/10/2021   Malnutrition of moderate degree 05/13/2021   Atrial flutter (HCC) 05/01/2021   Shock circulatory (HCC) 05/01/2021   Elevated troponin 12/21/2020   Hypokalemia 12/21/2020   HLD (hyperlipidemia) 12/21/2020   Acute on chronic respiratory failure with hypoxia (HCC) 12/21/2020   Chest pain 05/23/2020   Controlled substance agreement signed  04/03/2020   Benzodiazepine dependence (HCC) 04/03/2020   Dysphagia 02/27/2020   Tracheal stenosis 12/09/2019   Lymphedema 04/01/2019   CHF (congestive heart failure) (HCC) 08/27/2018   Aortic atherosclerosis (HCC) 05/11/2017   Depression 05/05/2017   GERD (gastroesophageal reflux disease) 01/16/2017   Diabetic neuropathy (HCC) 01/16/2017   Anterolisthesis 12/04/2016   History of MI (myocardial infarction) 12/04/2016   OSA (obstructive sleep apnea) 12/04/2016   Closed fracture dislocation of lumbar spine (HCC) 07/25/2016   Closed fracture of body of sternum 07/25/2016   Multiple fractures of cervical spine (HCC) 07/25/2016   Anxiety 02/14/2016   Chronic respiratory failure with hypoxia (HCC) 07/19/2014   HTN (hypertension) 07/19/2014   Cigarette smoker 07/19/2014   Coronary artery disease 06/22/2014   DM (diabetes mellitus), type 2 (HCC) 06/22/2014   Tobacco abuse 06/22/2014   Acute encephalopathy 06/22/2014   COPD (chronic obstructive pulmonary disease) with emphysema (HCC) 06/22/2014    Orientation RESPIRATION BLADDER Height & Weight     Self, Place  O2 (3L) Incontinent Weight: 149 lb 4.8 oz (67.7 kg) Height:  5\' 9"  (175.3 cm)  BEHAVIORAL SYMPTOMS/MOOD NEUROLOGICAL BOWEL NUTRITION STATUS      Incontinent Diet (see DC summary)  AMBULATORY STATUS COMMUNICATION OF NEEDS Skin   Extensive Assist Verbally Normal                       Personal Care Assistance Level of Assistance  Bathing, Feeding, Dressing Bathing Assistance: Maximum assistance Feeding assistance: Limited assistance Dressing Assistance: Maximum assistance     Functional Limitations Info  Sight, Hearing, Speech Sight Info: Impaired Hearing Info: Impaired Speech Info: Adequate    SPECIAL CARE  FACTORS FREQUENCY  PT (By licensed PT), OT (By licensed OT)     PT Frequency: 5x/week OT Frequency: 5x/week            Contractures Contractures Info: Not present    Additional Factors Info  Code  Status, Allergies Code Status Info: FULL Allergies Info: Gabapentin  Metformin And Related           Current Medications (07/10/2023):  This is the current hospital active medication list Current Facility-Administered Medications  Medication Dose Route Frequency Provider Last Rate Last Admin   acetaminophen (TYLENOL) tablet 1,000 mg  1,000 mg Oral QID Lewie Chamber, MD   1,000 mg at 07/10/23 0515   albuterol (PROVENTIL) (2.5 MG/3ML) 0.083% nebulizer solution 3 mL  3 mL Inhalation Q6H PRN Core, Doy Hutching, MD       amiodarone (PACERONE) tablet 200 mg  200 mg Oral Daily Core, Doy Hutching, MD   200 mg at 07/10/23 0909   amLODipine (NORVASC) tablet 10 mg  10 mg Oral Daily Kathrynn Running, MD   10 mg at 07/10/23 8413   apixaban (ELIQUIS) tablet 5 mg  5 mg Oral BID Leander Rams, RPH   5 mg at 07/10/23 2440   aspirin EC tablet 81 mg  81 mg Oral Daily Core, Doy Hutching, MD   81 mg at 07/10/23 0909   bisoprolol (ZEBETA) tablet 5 mg  5 mg Oral Daily Core, Doy Hutching, MD   5 mg at 07/10/23 0909   fluticasone furoate-vilanterol (BREO ELLIPTA) 100-25 MCG/ACT 1 puff  1 puff Inhalation Daily Paytes, Austin A, RPH   1 puff at 07/10/23 1027   folic acid (FOLVITE) tablet 1 mg  1 mg Oral Daily Core, Doy Hutching, MD   1 mg at 07/10/23 2536   furosemide (LASIX) tablet 20 mg  20 mg Oral Daily Marguerita Merles [place], DO   20 mg at 07/10/23 6440   insulin aspart (novoLOG) injection 0-5 Units  0-5 Units Subcutaneous QHS Core, Doy Hutching, MD       insulin aspart (novoLOG) injection 0-9 Units  0-9 Units Subcutaneous TID WC Core, Doy Hutching, MD   1 Units at 07/10/23 1304   isosorbide mononitrate (IMDUR) 24 hr tablet 30 mg  30 mg Oral Daily Kathrynn Running, MD   30 mg at 07/10/23 3474   multivitamin with minerals tablet 1 tablet  1 tablet Oral Daily Core, Doy Hutching, MD   1 tablet at 07/09/23 2595   nitroGLYCERIN (NITROSTAT) SL tablet 0.4 mg  0.4 mg Sublingual Q5 Min x 3 PRN Core, Doy Hutching, MD       Oral care mouth rinse  15 mL Mouth  Rinse PRN Lewie Chamber, MD       oxyCODONE (Oxy IR/ROXICODONE) immediate release tablet 5 mg  5 mg Oral Q4H PRN Lewie Chamber, MD       pantoprazole (PROTONIX) EC tablet 40 mg  40 mg Oral Daily Core, Doy Hutching, MD   40 mg at 07/10/23 0909   polyethylene glycol (MIRALAX / GLYCOLAX) packet 17 g  17 g Oral Daily Lewie Chamber, MD   17 g at 07/10/23 6387   prochlorperazine (COMPAZINE) injection 10 mg  10 mg Intravenous Q6H PRN Lewie Chamber, MD   10 mg at 07/09/23 1744   rosuvastatin (CRESTOR) tablet 20 mg  20 mg Oral Daily Core, Doy Hutching, MD   20 mg at 07/10/23 5643   senna-docusate (Senokot-S) tablet 1 tablet  1 tablet Oral BID  Lewie Chamber, MD   1 tablet at 07/10/23 1027   thiamine (VITAMIN B1) tablet 100 mg  100 mg Oral Daily Core, Doy Hutching, MD   100 mg at 07/10/23 2536   Or   thiamine (VITAMIN B1) injection 100 mg  100 mg Intravenous Daily Core, Doy Hutching, MD       triamcinolone (NASACORT) nasal inhaler 1 spray  1 spray Each Nare Daily Core, Doy Hutching, MD   1 spray at 07/10/23 0909   umeclidinium bromide (INCRUSE ELLIPTA) 62.5 MCG/ACT 1 puff  1 puff Inhalation Daily Paytes, Florentina Addison, RPH   1 puff at 07/10/23 0909     Discharge Medications: Please see discharge summary for a list of discharge medications.  Relevant Imaging Results:  Relevant Lab Results:   Additional Information [id number]  Caylen Yardley A [place], LCSWA
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
## 2023-07-10 NOTE — Care Management Important Message (Signed)
Important Message  Patient Details  Name: Ronald [name] MRN: [id number] Date of Birth: 1945/10/23   Important Message Given:  Yes - Medicare IM     Renie Ora 07/10/2023, 11:09 AM

## 2023-07-11 DIAGNOSIS — I5A Non-ischemic myocardial injury (non-traumatic): Secondary | ICD-10-CM | POA: Diagnosis not present

## 2023-07-11 DIAGNOSIS — I5033 Acute on chronic diastolic (congestive) heart failure: Secondary | ICD-10-CM | POA: Diagnosis not present

## 2023-07-11 DIAGNOSIS — G9341 Metabolic encephalopathy: Secondary | ICD-10-CM | POA: Diagnosis not present

## 2023-07-11 LAB — CULTURE, BLOOD (ROUTINE X 2)
Culture: NO GROWTH
Culture: NO GROWTH

## 2023-07-11 LAB — BASIC METABOLIC PANEL
Anion gap: 9 (ref 5–15)
BUN: 24 mg/dL — ABNORMAL HIGH (ref 8–23)
CO2: 25 mmol/L (ref 22–32)
Calcium: 8.1 mg/dL — ABNORMAL LOW (ref 8.9–10.3)
Chloride: 102 mmol/L (ref 98–111)
Creatinine, Ser: 1.22 mg/dL (ref 0.61–1.24)
GFR, Estimated: >60 mL/min (ref >60)
Glucose, Bld: 139 mg/dL — ABNORMAL HIGH (ref 70–99)
Potassium: 4.3 mmol/L (ref 3.5–5.1)
Sodium: 136 mmol/L (ref 135–145)

## 2023-07-11 LAB — GLUCOSE, CAPILLARY
Glucose-Capillary: 104 mg/dL — ABNORMAL HIGH (ref 70–99)
Glucose-Capillary: 170 mg/dL — ABNORMAL HIGH (ref 70–99)
Glucose-Capillary: 149 mg/dL — ABNORMAL HIGH (ref 70–99)
Glucose-Capillary: 141 mg/dL — ABNORMAL HIGH (ref 70–99)

## 2023-07-11 MED ORDER — MELATONIN 5 MG PO TABS
5.0000 mg | ORAL_TABLET | Freq: Every evening | ORAL | Status: DC | PRN
  Administered 2023-07-11 – 2023-07-14 (×4): 5 mg via ORAL
  Filled 2023-07-11 (×4): qty 1

## 2023-07-11 NOTE — Progress Notes (Signed)
Patient requesting that nursing cut splint off of his left arm due to putting pressure on upper arm.  Neurovascular intact.  Orthopedic technician called to evaluate splint.

## 2023-07-11 NOTE — Progress Notes (Signed)
Orthopedic Tech Progress Note Patient Details:  Ronald [name] April 06, 1946 [id number]  Ortho Devices Type of Ortho Device: Arm sling Ortho Device/Splint Location: For LUE, delivered to RN to place at bedside for use with therapies/mobility Ortho Device/Splint Interventions: Ordered   Post Interventions Patient Tolerated: Fair Instructions Provided: Care of device, Adjustment of device  Kymia Simi Carmine Savoy 07/11/2023, 4:44 PM
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
## 2023-07-11 NOTE — Progress Notes (Signed)
Orthopedic Tech Progress Note Patient Details:  RAYDELL MANERS 06-Dec-1945 [id number]  A shorter knee immobilizer was provided and placed at bedside as the pt did not want me to apply it. Given this, I adjusted his current brace to help improve the fit around his leg and to place it in a correct position. The pt verbally expressed his irritation about the need for a knee immobilizer multiple times.  Ortho Devices Type of Ortho Device: Knee Immobilizer Ortho Device/Splint Location: LLE Ortho Device/Splint Interventions: Ordered, Adjustment   Post Interventions Patient Tolerated: Fair Instructions Provided: Care of device, Adjustment of device  Serine Kea Carmine Savoy 07/11/2023, 11:15 AM

## 2023-07-11 NOTE — TOC Progression Note (Addendum)
Transition of Care [facility]) - Progression Note    Patient Details  Name: Ronald [name] MRN: [id number] Date of Birth: [birth year]  Transition of Care [facility]) CM/SW Contact  Nicanor Bake Phone Number: [phone] 07/11/2023, 12:27 PM  Clinical Narrative:   12:25 PM- CSW contacted Josh as from [facility] who confirmed bed availability for pt.   12:27 PM- CSW attempted to call the pt over the phone. However, pt was having a difficult time hearing. CSW will follow up with pt in person.   3:00 PM- CSW met with pt at bedside. CSW informed pt that he was accepted at preferred SNF. CSW asked for permission to start pts insurance auth. Pt agreed to start auth. Auth will be started as pt gets closer to being medically ready for dc.   TOC will continue following.     Expected Discharge Plan: Skilled Nursing Facility Barriers to Discharge: Continued Medical Work up  Expected Discharge Plan and Services In-house Referral: Clinical Social Work     Living arrangements for the past 2 months: Single Family Home                                       Social Determinants of Health (SDOH) Interventions SDOH Screenings   Food Insecurity: No Food Insecurity (07/05/2023)  Housing: Low Risk  (07/05/2023)  Transportation Needs: Unmet Transportation Needs (07/05/2023)  Utilities: Not At Risk (07/05/2023)  Alcohol Screen: Low Risk  (02/04/2023)  Depression (PHQ2-9): Low Risk  (02/04/2023)  Financial Resource Strain: Low Risk  (02/04/2023)  Physical Activity: Inactive (02/04/2023)  Social Connections: Unknown (07/05/2023)  Recent Concern: Social Connections - Socially Isolated (06/09/2023)  Stress: No Stress Concern Present (02/04/2023)  Tobacco Use: High Risk (07/05/2023)  Health Literacy: Adequate Health Literacy (02/04/2023)    Readmission Risk Interventions    06/08/2023    2:52 PM 12/09/2021    8:42 AM 11/25/2021    1:34 PM  Readmission Risk Prevention Plan  Transportation Screening  Complete Complete Complete  HRI or Home Care Consult Complete    Social Work Consult for Recovery Care Planning/Counseling Complete    Palliative Care Screening Not Applicable    Medication Review Oceanographer) Complete Complete Complete  PCP or Specialist appointment within 3-5 days of discharge   Not Complete  HRI or Home Care Consult  Complete Complete  SW Recovery Care/Counseling Consult  Complete Complete  Palliative Care Screening  Not Applicable Not Applicable  Skilled Nursing Facility  Not Applicable Not Applicable

## 2023-07-11 NOTE — Progress Notes (Signed)
Orthopedic Tech Progress Note Patient Details:  Ronald [name] [birth year] [id number]  Patient ID: Ronald [name], male   DOB: [birth year], 78 y.o.   MRN: [id number] I was called by the Rn due to pt complaint of splint. I was asked to check the splint. The splint was on right. The pt complains of tightness around wrist and pain on back side of splint at the humerus, I rewrapped the splint to adjust the tightness and put the pt in the arm sling that was in the room as they were straightening out there arm to much forcing the splint into the humerus causing the pain. The pt said they were feeling better before I left. Trinna Post 07/11/2023, 11:52 PM
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
## 2023-07-12 DIAGNOSIS — J9621 Acute and chronic respiratory failure with hypoxia: Secondary | ICD-10-CM | POA: Diagnosis not present

## 2023-07-12 DIAGNOSIS — G9341 Metabolic encephalopathy: Secondary | ICD-10-CM | POA: Diagnosis not present

## 2023-07-12 DIAGNOSIS — I5033 Acute on chronic diastolic (congestive) heart failure: Secondary | ICD-10-CM | POA: Diagnosis not present

## 2023-07-12 DIAGNOSIS — I5A Non-ischemic myocardial injury (non-traumatic): Secondary | ICD-10-CM | POA: Diagnosis not present

## 2023-07-12 LAB — GLUCOSE, CAPILLARY
Glucose-Capillary: 102 mg/dL — ABNORMAL HIGH (ref 70–99)
Glucose-Capillary: 121 mg/dL — ABNORMAL HIGH (ref 70–99)
Glucose-Capillary: 124 mg/dL — ABNORMAL HIGH (ref 70–99)
Glucose-Capillary: 133 mg/dL — ABNORMAL HIGH (ref 70–99)
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
## 2023-07-13 DIAGNOSIS — S5292XA Unspecified fracture of left forearm, initial encounter for closed fracture: Secondary | ICD-10-CM

## 2023-07-13 DIAGNOSIS — I5033 Acute on chronic diastolic (congestive) heart failure: Secondary | ICD-10-CM | POA: Diagnosis not present

## 2023-07-13 DIAGNOSIS — S52202A Unspecified fracture of shaft of left ulna, initial encounter for closed fracture: Secondary | ICD-10-CM

## 2023-07-13 DIAGNOSIS — I5A Non-ischemic myocardial injury (non-traumatic): Secondary | ICD-10-CM | POA: Diagnosis not present

## 2023-07-13 DIAGNOSIS — S82142A Displaced bicondylar fracture of left tibia, initial encounter for closed fracture: Secondary | ICD-10-CM

## 2023-07-13 DIAGNOSIS — G9341 Metabolic encephalopathy: Secondary | ICD-10-CM | POA: Diagnosis not present

## 2023-07-13 LAB — GLUCOSE, CAPILLARY
Glucose-Capillary: 143 mg/dL — ABNORMAL HIGH (ref 70–99)
Glucose-Capillary: 135 mg/dL — ABNORMAL HIGH (ref 70–99)
Glucose-Capillary: 226 mg/dL — ABNORMAL HIGH (ref 70–99)
Glucose-Capillary: 89 mg/dL (ref 70–99)

## 2023-07-13 MED ORDER — MUSCLE RUB 10-15 % EX CREA
TOPICAL_CREAM | CUTANEOUS | Status: DC | PRN
  Filled 2023-07-13: qty 85

## 2023-07-13 NOTE — Progress Notes (Signed)
Mobility Specialist Progress Note;   07/13/23 0900  Mobility  Activity Ambulated with assistance in hallway  Level of Assistance Contact guard assist, steadying assist  Assistive Device Front wheel walker  Distance Ambulated (ft) 200 ft  Activity Response Tolerated fair  Mobility Referral Yes  Mobility visit 1 Mobility  Mobility Specialist Start Time (ACUTE ONLY) 0900  Mobility Specialist Stop Time (ACUTE ONLY) 0930  Mobility Specialist Time Calculation (min) (ACUTE ONLY) 30 min   Answered pts chair alarm, pt was ambulating around room w/ RW by himself. Pt was convinced he was leaving the hospital at the time and very hard to redirect. Pt tried to exit hallway door, setting off alarm, and became agitated at staff when trying to redirect back to room. Security eventually called. Pt was returned safely back to room, sitting on EOB w/ alarm on. RN in room.   Caesar Bookman Mobility Specialist Please contact via SecureChat or [employer] [phone]

## 2023-07-13 NOTE — Care Management Important Message (Signed)
Important Message  Patient Details  Name: Ronald [name] MRN: [id number] Date of Birth: 08-16-1945   Important Message Given:  Yes - Medicare IM     Renie Ora 07/13/2023, 10:29 AM

## 2023-07-13 NOTE — Progress Notes (Signed)
Discharge instructions (including medications) discussed with and copy provided to patient/caregiver  Picked up TOC meds and delivered to patient

## 2023-07-13 NOTE — TOC Progression Note (Signed)
Transition of Care [facility]) - Progression Note    Patient Details  Name: Ronald [name] MRN: [id number] Date of Birth: 1945/07/06  [facility] CM/SW Contact  Delilah Shan, LCSWA Phone Number: 07/13/2023, 12:40 PM  Clinical Narrative:     Patient has SNF bed at [facility]. Insurance authorization has been started Ref# [id number]. Insurance authorization currently pending.CSW will continue to follow and assist with patients dc planning needs.  Expected Discharge Plan: Skilled Nursing Facility Barriers to Discharge: Continued Medical Work up  Expected Discharge Plan and Services In-house Referral: Clinical Social Work     Living arrangements for the past 2 months: Single Family Home                                       Social Determinants of Health (SDOH) Interventions SDOH Screenings   Food Insecurity: No Food Insecurity (07/05/2023)  Housing: Low Risk  (07/05/2023)  Transportation Needs: Unmet Transportation Needs (07/05/2023)  Utilities: Not At Risk (07/05/2023)  Alcohol Screen: Low Risk  (02/04/2023)  Depression (PHQ2-9): Low Risk  (02/04/2023)  Financial Resource Strain: Low Risk  (02/04/2023)  Physical Activity: Inactive (02/04/2023)  Social Connections: Unknown (07/05/2023)  Recent Concern: Social Connections - Socially Isolated (06/09/2023)  Stress: No Stress Concern Present (02/04/2023)  Tobacco Use: High Risk (07/05/2023)  Health Literacy: Adequate Health Literacy (02/04/2023)    Readmission Risk Interventions    06/08/2023    2:52 PM 12/09/2021    8:42 AM 11/25/2021    1:34 PM  Readmission Risk Prevention Plan  Transportation Screening Complete Complete Complete  HRI or Home Care Consult Complete    Social Work Consult for Recovery Care Planning/Counseling Complete    Palliative Care Screening Not Applicable    Medication Review Oceanographer) Complete Complete Complete  PCP or Specialist appointment within 3-5 days of discharge   Not Complete   HRI or Home Care Consult  Complete Complete  SW Recovery Care/Counseling Consult  Complete Complete  Palliative Care Screening  Not Applicable Not Applicable  Skilled Nursing Facility  Not Applicable Not Applicable

## 2023-07-13 NOTE — Plan of Care (Signed)
  Problem: Education: Goal: Knowledge of General Education information will improve Description: Including pain rating scale, medication(s)/side effects and non-pharmacologic comfort measures Outcome: Progressing   Problem: Health Behavior/Discharge Planning: Goal: Ability to manage health-related needs will improve Outcome: Progressing   Problem: Clinical Measurements: Goal: Ability to maintain clinical measurements within normal limits will improve Outcome: Progressing Goal: Will remain free from infection Outcome: Progressing Goal: Diagnostic test results will improve Outcome: Progressing Goal: Respiratory complications will improve Outcome: Progressing Goal: Cardiovascular complication will be avoided Outcome: Progressing   Problem: Activity: Goal: Risk for activity intolerance will decrease Outcome: Progressing   Problem: Nutrition: Goal: Adequate nutrition will be maintained Outcome: Progressing   Problem: Coping: Goal: Level of anxiety will decrease Outcome: Progressing   Problem: Elimination: Goal: Will not experience complications related to bowel motility Outcome: Progressing Goal: Will not experience complications related to urinary retention Outcome: Progressing   Problem: Pain Managment: Goal: General experience of comfort will improve and/or be controlled Outcome: Progressing   Problem: Safety: Goal: Ability to remain free from injury will improve Outcome: Progressing   Problem: Skin Integrity: Goal: Risk for impaired skin integrity will decrease Outcome: Progressing   Problem: Education: Goal: Ability to demonstrate management of disease process will improve Outcome: Progressing Goal: Ability to verbalize understanding of medication therapies will improve Outcome: Progressing Goal: Individualized Educational Video(s) Outcome: Progressing   Problem: Activity: Goal: Capacity to carry out activities will improve Outcome: Progressing    Problem: Cardiac: Goal: Ability to achieve and maintain adequate cardiopulmonary perfusion will improve Outcome: Progressing   Problem: Education: Goal: Ability to demonstrate management of disease process will improve Outcome: Progressing Goal: Ability to verbalize understanding of medication therapies will improve Outcome: Progressing Goal: Individualized Educational Video(s) Outcome: Progressing   Problem: Activity: Goal: Capacity to carry out activities will improve Outcome: Progressing   Problem: Cardiac: Goal: Ability to achieve and maintain adequate cardiopulmonary perfusion will improve Outcome: Progressing   Problem: Education: Goal: Ability to describe self-care measures that may prevent or decrease complications (Diabetes Survival Skills Education) will improve Outcome: Progressing Goal: Individualized Educational Video(s) Outcome: Progressing   Problem: Coping: Goal: Ability to adjust to condition or change in health will improve Outcome: Progressing   Problem: Fluid Volume: Goal: Ability to maintain a balanced intake and output will improve Outcome: Progressing   Problem: Health Behavior/Discharge Planning: Goal: Ability to identify and utilize available resources and services will improve Outcome: Progressing Goal: Ability to manage health-related needs will improve Outcome: Progressing   Problem: Metabolic: Goal: Ability to maintain appropriate glucose levels will improve Outcome: Progressing   Problem: Nutritional: Goal: Maintenance of adequate nutrition will improve Outcome: Progressing Goal: Progress toward achieving an optimal weight will improve Outcome: Progressing   Problem: Skin Integrity: Goal: Risk for impaired skin integrity will decrease Outcome: Progressing   Problem: Tissue Perfusion: Goal: Adequacy of tissue perfusion will improve Outcome: Progressing
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
## 2023-07-13 NOTE — Progress Notes (Signed)
[name] Busby updated on pt status.

## 2023-07-14 DIAGNOSIS — T07XXXA Unspecified multiple injuries, initial encounter: Secondary | ICD-10-CM | POA: Diagnosis not present

## 2023-07-14 DIAGNOSIS — G9341 Metabolic encephalopathy: Secondary | ICD-10-CM | POA: Diagnosis not present

## 2023-07-14 DIAGNOSIS — I5033 Acute on chronic diastolic (congestive) heart failure: Secondary | ICD-10-CM | POA: Diagnosis not present

## 2023-07-14 LAB — GLUCOSE, CAPILLARY
Glucose-Capillary: 133 mg/dL — ABNORMAL HIGH (ref 70–99)
Glucose-Capillary: 124 mg/dL — ABNORMAL HIGH (ref 70–99)
Glucose-Capillary: 143 mg/dL — ABNORMAL HIGH (ref 70–99)
Glucose-Capillary: 131 mg/dL — ABNORMAL HIGH (ref 70–99)

## 2023-07-14 NOTE — TOC Progression Note (Addendum)
 Transition of Care [facility]) - Progression Note    Patient Details  Name: Ronald [name] MRN: [id number] Date of Birth: 1945/09/30  [facility] CM/SW Contact  [name], LCSWA Phone Number: 07/14/2023, 1:21 PM  Clinical Narrative:     Patient has SNF bed at [facility]. Insurance authorization currently pending. CSW will continue to follow and assist with patients dc planning needs.  Update- Patients insurance authorization has been approved 2/3 - 2/9 NRD 2/10. CSW informed [name] with [facility] confirmed can accept patient when medically ready.   Expected Discharge Plan: Skilled Nursing Facility Barriers to Discharge: Continued Medical Work up  Expected Discharge Plan and Services In-house Referral: Clinical Social Work     Living arrangements for the past 2 months: Single Family Home                                       Social Determinants of Health (SDOH) Interventions SDOH Screenings   Food Insecurity: No Food Insecurity (07/05/2023)  Housing: Low Risk  (07/05/2023)  Transportation Needs: Unmet Transportation Needs (07/05/2023)  Utilities: Not At Risk (07/05/2023)  Alcohol Screen: Low Risk  (02/04/2023)  Depression (PHQ2-9): Low Risk  (02/04/2023)  Financial Resource Strain: Low Risk  (02/04/2023)  Physical Activity: Inactive (02/04/2023)  Social Connections: Unknown (07/05/2023)  Recent Concern: Social Connections - Socially Isolated (06/09/2023)  Stress: No Stress Concern Present (02/04/2023)  Tobacco Use: High Risk (07/05/2023)  Health Literacy: Adequate Health Literacy (02/04/2023)    Readmission Risk Interventions    06/08/2023    2:52 PM 12/09/2021    8:42 AM 11/25/2021    1:34 PM  Readmission Risk Prevention Plan  Transportation Screening Complete Complete Complete  HRI or Home Care Consult Complete    Social Work Consult for Recovery Care Planning/Counseling Complete    Palliative Care Screening Not Applicable    Medication Review Furniture Conservator/restorer) Complete Complete Complete  PCP or Specialist appointment within 3-5 days of discharge   Not Complete  HRI or Home Care Consult  Complete Complete  SW Recovery Care/Counseling Consult  Complete Complete  Palliative Care Screening  Not Applicable Not Applicable  Skilled Nursing Facility  Not Applicable Not Applicable

## 2023-07-14 NOTE — Progress Notes (Signed)
 Orthopedic Tech Progress Note Patient Details:  KURT HOFFMEIER 03-20-1946 [id number]  Ortho Devices Type of Ortho Device: Velcro wrist forearm splint Ortho Device/Splint Location: LUE Ortho Device/Splint Interventions: Ordered, Application, AdjustmentRN reached out to me letting know patient was having some discomfort with splint, so I reached out to [facility] PA and he stated I could apply velcro wrist brace to patient. Applied with RN at bedside    Post Interventions Patient Tolerated: Well Instructions Provided: Care of device  Delanna LITTIE Pac 07/14/2023, 3:10 PM

## 2023-07-14 NOTE — Plan of Care (Signed)
  Problem: Education: Goal: Knowledge of General Education information will improve Description: Including pain rating scale, medication(s)/side effects and non-pharmacologic comfort measures Outcome: Progressing   Problem: Health Behavior/Discharge Planning: Goal: Ability to manage health-related needs will improve Outcome: Progressing   Problem: Clinical Measurements: Goal: Ability to maintain clinical measurements within normal limits will improve Outcome: Progressing Goal: Will remain free from infection Outcome: Progressing Goal: Diagnostic test results will improve Outcome: Progressing Goal: Respiratory complications will improve Outcome: Progressing Goal: Cardiovascular complication will be avoided Outcome: Progressing   Problem: Activity: Goal: Risk for activity intolerance will decrease Outcome: Progressing   Problem: Nutrition: Goal: Adequate nutrition will be maintained Outcome: Progressing   Problem: Coping: Goal: Level of anxiety will decrease Outcome: Progressing   Problem: Elimination: Goal: Will not experience complications related to bowel motility Outcome: Progressing Goal: Will not experience complications related to urinary retention Outcome: Progressing   Problem: Pain Managment: Goal: General experience of comfort will improve and/or be controlled Outcome: Progressing   Problem: Safety: Goal: Ability to remain free from injury will improve Outcome: Progressing   Problem: Skin Integrity: Goal: Risk for impaired skin integrity will decrease Outcome: Progressing   Problem: Education: Goal: Ability to demonstrate management of disease process will improve Outcome: Progressing Goal: Ability to verbalize understanding of medication therapies will improve Outcome: Progressing Goal: Individualized Educational Video(s) Outcome: Progressing   Problem: Cardiac: Goal: Ability to achieve and maintain adequate cardiopulmonary perfusion will  improve Outcome: Progressing   Problem: Education: Goal: Ability to demonstrate management of disease process will improve Outcome: Progressing Goal: Ability to verbalize understanding of medication therapies will improve Outcome: Progressing Goal: Individualized Educational Video(s) Outcome: Progressing   Problem: Activity: Goal: Capacity to carry out activities will improve Outcome: Progressing   Problem: Cardiac: Goal: Ability to achieve and maintain adequate cardiopulmonary perfusion will improve Outcome: Progressing   Problem: Education: Goal: Ability to describe self-care measures that may prevent or decrease complications (Diabetes Survival Skills Education) will improve Outcome: Progressing Goal: Individualized Educational Video(s) Outcome: Progressing   Problem: Coping: Goal: Ability to adjust to condition or change in health will improve Outcome: Progressing   Problem: Fluid Volume: Goal: Ability to maintain a balanced intake and output will improve Outcome: Progressing   Problem: Health Behavior/Discharge Planning: Goal: Ability to identify and utilize available resources and services will improve Outcome: Progressing Goal: Ability to manage health-related needs will improve Outcome: Progressing   Problem: Metabolic: Goal: Ability to maintain appropriate glucose levels will improve Outcome: Progressing   Problem: Nutritional: Goal: Maintenance of adequate nutrition will improve Outcome: Progressing Goal: Progress toward achieving an optimal weight will improve Outcome: Progressing   Problem: Skin Integrity: Goal: Risk for impaired skin integrity will decrease Outcome: Progressing   Problem: Tissue Perfusion: Goal: Adequacy of tissue perfusion will improve Outcome: Progressing
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
## 2023-07-14 NOTE — Progress Notes (Signed)
 Removed 5 sutures from healed left hand laceration; no bleeding, pus discharge, erythema or other issues noted.  Patient complained of pain from the wrist to elbow with orthopedic brace in place and secured with ACE wrap.  Some swelling was noted at the level of the wrist.  Patient requested the brace be removed or he would take it off himself, so this RN unwrapped and removed the ACE and device.  Noted significant, 3+ pitting edema under the brace with deep tissue indentations from the brace itself.  Some bruising also noted.  Patient endorsed some immediate relief, however, he refused to have the brace and compression wrap replaced.  Notified Dr. Patsy by secure message, who showed concern for further displacement of underlying pathologic fractures of radius and ulna.  MD suggested ortho contact and work to convince Ronald [name] to allow us  to reinstall the brace and compression.  S/W ortho tech, Daye by phone; she agreed to round on Ronald [name], noted he has had issues with swelling previously, and stated she will inquire with orthopedics if a soft velcro brace can be utilized instead.  Will continue to monitor.
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
## 2023-07-15 DIAGNOSIS — R609 Edema, unspecified: Secondary | ICD-10-CM | POA: Diagnosis not present

## 2023-07-15 DIAGNOSIS — Z794 Long term (current) use of insulin: Secondary | ICD-10-CM | POA: Diagnosis not present

## 2023-07-15 DIAGNOSIS — I2699 Other pulmonary embolism without acute cor pulmonale: Secondary | ICD-10-CM | POA: Diagnosis not present

## 2023-07-15 DIAGNOSIS — J9610 Chronic respiratory failure, unspecified whether with hypoxia or hypercapnia: Secondary | ICD-10-CM | POA: Diagnosis not present

## 2023-07-15 DIAGNOSIS — F17210 Nicotine dependence, cigarettes, uncomplicated: Secondary | ICD-10-CM | POA: Diagnosis not present

## 2023-07-15 DIAGNOSIS — Z8709 Personal history of other diseases of the respiratory system: Secondary | ICD-10-CM | POA: Diagnosis not present

## 2023-07-15 DIAGNOSIS — M25561 Pain in right knee: Secondary | ICD-10-CM | POA: Diagnosis not present

## 2023-07-15 DIAGNOSIS — I10 Essential (primary) hypertension: Secondary | ICD-10-CM | POA: Diagnosis not present

## 2023-07-15 DIAGNOSIS — J189 Pneumonia, unspecified organism: Secondary | ICD-10-CM | POA: Diagnosis not present

## 2023-07-15 DIAGNOSIS — I959 Hypotension, unspecified: Secondary | ICD-10-CM | POA: Diagnosis not present

## 2023-07-15 DIAGNOSIS — G9341 Metabolic encephalopathy: Secondary | ICD-10-CM | POA: Diagnosis not present

## 2023-07-15 DIAGNOSIS — Z681 Body mass index (BMI) 19 or less, adult: Secondary | ICD-10-CM | POA: Diagnosis not present

## 2023-07-15 DIAGNOSIS — J9600 Acute respiratory failure, unspecified whether with hypoxia or hypercapnia: Secondary | ICD-10-CM | POA: Diagnosis not present

## 2023-07-15 DIAGNOSIS — S52602A Unspecified fracture of lower end of left ulna, initial encounter for closed fracture: Secondary | ICD-10-CM | POA: Diagnosis not present

## 2023-07-15 DIAGNOSIS — R069 Unspecified abnormalities of breathing: Secondary | ICD-10-CM | POA: Diagnosis not present

## 2023-07-15 DIAGNOSIS — R7981 Abnormal blood-gas level: Secondary | ICD-10-CM | POA: Diagnosis not present

## 2023-07-15 DIAGNOSIS — E782 Mixed hyperlipidemia: Secondary | ICD-10-CM | POA: Diagnosis not present

## 2023-07-15 DIAGNOSIS — S52592D Other fractures of lower end of left radius, subsequent encounter for closed fracture with routine healing: Secondary | ICD-10-CM | POA: Diagnosis not present

## 2023-07-15 DIAGNOSIS — R1312 Dysphagia, oropharyngeal phase: Secondary | ICD-10-CM | POA: Diagnosis not present

## 2023-07-15 DIAGNOSIS — R748 Abnormal levels of other serum enzymes: Secondary | ICD-10-CM | POA: Diagnosis not present

## 2023-07-15 DIAGNOSIS — N1831 Chronic kidney disease, stage 3a: Secondary | ICD-10-CM | POA: Diagnosis not present

## 2023-07-15 DIAGNOSIS — Z7401 Bed confinement status: Secondary | ICD-10-CM | POA: Diagnosis not present

## 2023-07-15 DIAGNOSIS — D649 Anemia, unspecified: Secondary | ICD-10-CM | POA: Diagnosis not present

## 2023-07-15 DIAGNOSIS — R5381 Other malaise: Secondary | ICD-10-CM | POA: Diagnosis not present

## 2023-07-15 DIAGNOSIS — Z1152 Encounter for screening for COVID-19: Secondary | ICD-10-CM | POA: Diagnosis not present

## 2023-07-15 DIAGNOSIS — R059 Cough, unspecified: Secondary | ICD-10-CM | POA: Diagnosis not present

## 2023-07-15 DIAGNOSIS — R001 Bradycardia, unspecified: Secondary | ICD-10-CM | POA: Diagnosis not present

## 2023-07-15 DIAGNOSIS — I2489 Other forms of acute ischemic heart disease: Secondary | ICD-10-CM | POA: Diagnosis not present

## 2023-07-15 DIAGNOSIS — Z7951 Long term (current) use of inhaled steroids: Secondary | ICD-10-CM | POA: Diagnosis not present

## 2023-07-15 DIAGNOSIS — E785 Hyperlipidemia, unspecified: Secondary | ICD-10-CM | POA: Diagnosis not present

## 2023-07-15 DIAGNOSIS — R64 Cachexia: Secondary | ICD-10-CM | POA: Diagnosis not present

## 2023-07-15 DIAGNOSIS — R0602 Shortness of breath: Secondary | ICD-10-CM | POA: Diagnosis not present

## 2023-07-15 DIAGNOSIS — I110 Hypertensive heart disease with heart failure: Secondary | ICD-10-CM | POA: Diagnosis not present

## 2023-07-15 DIAGNOSIS — Z7982 Long term (current) use of aspirin: Secondary | ICD-10-CM | POA: Diagnosis not present

## 2023-07-15 DIAGNOSIS — J9622 Acute and chronic respiratory failure with hypercapnia: Secondary | ICD-10-CM | POA: Diagnosis not present

## 2023-07-15 DIAGNOSIS — Y92009 Unspecified place in unspecified non-institutional (private) residence as the place of occurrence of the external cause: Secondary | ICD-10-CM | POA: Diagnosis not present

## 2023-07-15 DIAGNOSIS — Z886 Allergy status to analgesic agent status: Secondary | ICD-10-CM | POA: Diagnosis not present

## 2023-07-15 DIAGNOSIS — S82002A Unspecified fracture of left patella, initial encounter for closed fracture: Secondary | ICD-10-CM | POA: Diagnosis not present

## 2023-07-15 DIAGNOSIS — E1129 Type 2 diabetes mellitus with other diabetic kidney complication: Secondary | ICD-10-CM | POA: Diagnosis not present

## 2023-07-15 DIAGNOSIS — J441 Chronic obstructive pulmonary disease with (acute) exacerbation: Secondary | ICD-10-CM | POA: Diagnosis not present

## 2023-07-15 DIAGNOSIS — Z743 Need for continuous supervision: Secondary | ICD-10-CM | POA: Diagnosis not present

## 2023-07-15 DIAGNOSIS — Z881 Allergy status to other antibiotic agents status: Secondary | ICD-10-CM | POA: Diagnosis not present

## 2023-07-15 DIAGNOSIS — Z20822 Contact with and (suspected) exposure to covid-19: Secondary | ICD-10-CM | POA: Diagnosis not present

## 2023-07-15 DIAGNOSIS — S52502D Unspecified fracture of the lower end of left radius, subsequent encounter for closed fracture with routine healing: Secondary | ICD-10-CM | POA: Diagnosis not present

## 2023-07-15 DIAGNOSIS — S82112D Displaced fracture of left tibial spine, subsequent encounter for closed fracture with routine healing: Secondary | ICD-10-CM | POA: Diagnosis not present

## 2023-07-15 DIAGNOSIS — R41841 Cognitive communication deficit: Secondary | ICD-10-CM | POA: Diagnosis not present

## 2023-07-15 DIAGNOSIS — E669 Obesity, unspecified: Secondary | ICD-10-CM | POA: Diagnosis not present

## 2023-07-15 DIAGNOSIS — Z23 Encounter for immunization: Secondary | ICD-10-CM | POA: Diagnosis not present

## 2023-07-15 DIAGNOSIS — F0280 Dementia in other diseases classified elsewhere without behavioral disturbance: Secondary | ICD-10-CM | POA: Diagnosis not present

## 2023-07-15 DIAGNOSIS — S52002G Unspecified fracture of upper end of left ulna, subsequent encounter for closed fracture with delayed healing: Secondary | ICD-10-CM | POA: Diagnosis not present

## 2023-07-15 DIAGNOSIS — J9621 Acute and chronic respiratory failure with hypoxia: Secondary | ICD-10-CM | POA: Diagnosis not present

## 2023-07-15 DIAGNOSIS — E440 Moderate protein-calorie malnutrition: Secondary | ICD-10-CM | POA: Diagnosis not present

## 2023-07-15 DIAGNOSIS — F339 Major depressive disorder, recurrent, unspecified: Secondary | ICD-10-CM | POA: Diagnosis not present

## 2023-07-15 DIAGNOSIS — N1832 Chronic kidney disease, stage 3b: Secondary | ICD-10-CM | POA: Diagnosis not present

## 2023-07-15 DIAGNOSIS — I2510 Atherosclerotic heart disease of native coronary artery without angina pectoris: Secondary | ICD-10-CM | POA: Diagnosis not present

## 2023-07-15 DIAGNOSIS — I5030 Unspecified diastolic (congestive) heart failure: Secondary | ICD-10-CM | POA: Diagnosis not present

## 2023-07-15 DIAGNOSIS — J439 Emphysema, unspecified: Secondary | ICD-10-CM | POA: Diagnosis not present

## 2023-07-15 DIAGNOSIS — R918 Other nonspecific abnormal finding of lung field: Secondary | ICD-10-CM | POA: Diagnosis not present

## 2023-07-15 DIAGNOSIS — I5A Non-ischemic myocardial injury (non-traumatic): Secondary | ICD-10-CM | POA: Diagnosis not present

## 2023-07-15 DIAGNOSIS — E441 Mild protein-calorie malnutrition: Secondary | ICD-10-CM | POA: Diagnosis not present

## 2023-07-15 DIAGNOSIS — I700 Atherosclerosis of aorta: Secondary | ICD-10-CM | POA: Diagnosis not present

## 2023-07-15 DIAGNOSIS — N179 Acute kidney failure, unspecified: Secondary | ICD-10-CM | POA: Diagnosis not present

## 2023-07-15 DIAGNOSIS — Y92410 Unspecified street and highway as the place of occurrence of the external cause: Secondary | ICD-10-CM | POA: Diagnosis not present

## 2023-07-15 DIAGNOSIS — Z7901 Long term (current) use of anticoagulants: Secondary | ICD-10-CM | POA: Diagnosis not present

## 2023-07-15 DIAGNOSIS — K219 Gastro-esophageal reflux disease without esophagitis: Secondary | ICD-10-CM | POA: Diagnosis not present

## 2023-07-15 DIAGNOSIS — Z792 Long term (current) use of antibiotics: Secondary | ICD-10-CM | POA: Diagnosis not present

## 2023-07-15 DIAGNOSIS — E119 Type 2 diabetes mellitus without complications: Secondary | ICD-10-CM | POA: Diagnosis not present

## 2023-07-15 DIAGNOSIS — Z888 Allergy status to other drugs, medicaments and biological substances status: Secondary | ICD-10-CM | POA: Diagnosis not present

## 2023-07-15 DIAGNOSIS — R262 Difficulty in walking, not elsewhere classified: Secondary | ICD-10-CM | POA: Diagnosis not present

## 2023-07-15 DIAGNOSIS — E1169 Type 2 diabetes mellitus with other specified complication: Secondary | ICD-10-CM | POA: Diagnosis not present

## 2023-07-15 DIAGNOSIS — I4892 Unspecified atrial flutter: Secondary | ICD-10-CM | POA: Diagnosis not present

## 2023-07-15 DIAGNOSIS — S52601D Unspecified fracture of lower end of right ulna, subsequent encounter for closed fracture with routine healing: Secondary | ICD-10-CM | POA: Diagnosis not present

## 2023-07-15 DIAGNOSIS — J440 Chronic obstructive pulmonary disease with acute lower respiratory infection: Secondary | ICD-10-CM | POA: Diagnosis not present

## 2023-07-15 DIAGNOSIS — I480 Paroxysmal atrial fibrillation: Secondary | ICD-10-CM | POA: Diagnosis not present

## 2023-07-15 DIAGNOSIS — S62327A Displaced fracture of shaft of fifth metacarpal bone, left hand, initial encounter for closed fracture: Secondary | ICD-10-CM | POA: Diagnosis not present

## 2023-07-15 DIAGNOSIS — S62327D Displaced fracture of shaft of fifth metacarpal bone, left hand, subsequent encounter for fracture with routine healing: Secondary | ICD-10-CM | POA: Diagnosis not present

## 2023-07-15 DIAGNOSIS — S6292XD Unspecified fracture of left wrist and hand, subsequent encounter for fracture with routine healing: Secondary | ICD-10-CM | POA: Diagnosis not present

## 2023-07-15 DIAGNOSIS — I5032 Chronic diastolic (congestive) heart failure: Secondary | ICD-10-CM | POA: Diagnosis not present

## 2023-07-15 DIAGNOSIS — J449 Chronic obstructive pulmonary disease, unspecified: Secondary | ICD-10-CM | POA: Diagnosis not present

## 2023-07-15 DIAGNOSIS — R0689 Other abnormalities of breathing: Secondary | ICD-10-CM | POA: Diagnosis not present

## 2023-07-15 DIAGNOSIS — Z79899 Other long term (current) drug therapy: Secondary | ICD-10-CM | POA: Diagnosis not present

## 2023-07-15 DIAGNOSIS — R7989 Other specified abnormal findings of blood chemistry: Secondary | ICD-10-CM | POA: Diagnosis not present

## 2023-07-15 DIAGNOSIS — F419 Anxiety disorder, unspecified: Secondary | ICD-10-CM | POA: Diagnosis not present

## 2023-07-15 DIAGNOSIS — R0789 Other chest pain: Secondary | ICD-10-CM | POA: Diagnosis not present

## 2023-07-15 DIAGNOSIS — S52322D Displaced transverse fracture of shaft of left radius, subsequent encounter for closed fracture with routine healing: Secondary | ICD-10-CM | POA: Diagnosis not present

## 2023-07-15 DIAGNOSIS — R1310 Dysphagia, unspecified: Secondary | ICD-10-CM | POA: Diagnosis not present

## 2023-07-15 DIAGNOSIS — G309 Alzheimer's disease, unspecified: Secondary | ICD-10-CM | POA: Diagnosis not present

## 2023-07-15 DIAGNOSIS — I509 Heart failure, unspecified: Secondary | ICD-10-CM | POA: Diagnosis not present

## 2023-07-15 DIAGNOSIS — M6281 Muscle weakness (generalized): Secondary | ICD-10-CM | POA: Diagnosis not present

## 2023-07-15 LAB — GLUCOSE, CAPILLARY: Glucose-Capillary: 107 mg/dL — ABNORMAL HIGH (ref 70–99)

## 2023-07-15 MED ORDER — OXYCODONE HCL 5 MG PO TABS: 5.0000 mg | ORAL_TABLET | Freq: Four times a day (QID) | ORAL | 0 refills | Status: AC | PRN

## 2023-07-15 MED ORDER — AMLODIPINE BESYLATE 10 MG PO TABS: 10.0000 mg | ORAL_TABLET | Freq: Every day | ORAL | Status: DC

## 2023-07-15 MED ORDER — ADULT MULTIVITAMIN W/MINERALS CH: 1.0000 | ORAL_TABLET | Freq: Every day | ORAL | Status: DC

## 2023-07-15 MED ORDER — ACETAMINOPHEN 500 MG PO TABS: ORAL_TABLET | ORAL | Status: AC

## 2023-07-15 MED ORDER — TRAMADOL HCL 50 MG PO TABS: 50.0000 mg | ORAL_TABLET | Freq: Three times a day (TID) | ORAL | 0 refills | Status: AC | PRN

## 2023-07-15 MED ORDER — VITAMIN B-1 100 MG PO TABS: 100.0000 mg | ORAL_TABLET | Freq: Every day | ORAL | Status: DC

## 2023-07-15 MED ORDER — FOLIC ACID 1 MG PO TABS: 1.0000 mg | ORAL_TABLET | Freq: Every day | ORAL | Status: DC

## 2023-07-15 NOTE — Consult Note (Signed)
 [facility] Liaison Consult Note   07/15/2023  Ronald [name] Oct 16, 1945 [id number]  Covering [name] [facility] hospital liaison)  Update:  Discharged disposition: Pt will discharge today to [facility] for SNF level of care. Liaison will collaborate with PAC-RN concerning this disposition.  [facility] does not replace or interfere with any arrangements made by the Inpatient Transition of Care Team.  For questions contact:  Olam Ku, RN, [facility] Liaison [place]   [facility], Population Health Office Hours MTWF  8:00 am-6:00 pm Direct Dial: [phone] mobile [phone] [Office toll free line] Office Hours are M-F 8:30 - 5 pm [email or web address]

## 2023-07-15 NOTE — TOC Transition Note (Signed)
 Transition of Care [facility]) - Discharge Note   Patient Details  Name: Ronald [name] MRN: [id number] Date of Birth: [birth year]  Transition of Care [facility]) CM/SW Contact:  [name], LCSWA Phone Number: 07/15/2023, 11:04 AM   Clinical Narrative:     CSW spoke with [name] with [facility] rehab who confirmed patient can dc over today if medically ready. Per MD patient medically ready for DC.  Patient will DC to: [facility]  Anticipated DC date: 07/15/2023  Family notified: [name].  Transport by [employer]  ?  Per MD patient ready for DC to [facility]. RN, patient, patient's family, and facility notified of DC. Discharge Summary sent to facility. RN given number for report [phone] RM# [id number]. DC packet on chart. Ambulance transport requested for patient.  CSW signing off.   Final next level of care: Skilled Nursing Facility Barriers to Discharge: No Barriers Identified   Patient Goals and CMS Choice Patient states their goals for this hospitalization and ongoing recovery are:: SNF CMS Medicare.gov Compare Post Acute Care list provided to:: Patient Choice offered to / list presented to : Patient, Adult Children      Discharge Placement              Patient chooses bed at:  [facility]) Patient to be transferred to facility by: [facility] Name of family member notified: [name]. Patient and family notified of of transfer: 07/15/23  Discharge Plan and Services Additional resources added to the After Visit Summary for   In-house Referral: Clinical Social Work                                   Social Drivers of Health (SDOH) Interventions SDOH Screenings   Food Insecurity: No Food Insecurity (07/05/2023)  Housing: Low Risk  (07/05/2023)  Transportation Needs: Unmet Transportation Needs (07/05/2023)  Utilities: Not At Risk (07/05/2023)  Alcohol Screen: Low Risk  (02/04/2023)  Depression (PHQ2-9): Low Risk  (02/04/2023)  Financial Resource Strain: Low Risk  (02/04/2023)   Physical Activity: Inactive (02/04/2023)  Social Connections: Unknown (07/05/2023)  Recent Concern: Social Connections - Socially Isolated (06/09/2023)  Stress: No Stress Concern Present (02/04/2023)  Tobacco Use: High Risk (07/05/2023)  Health Literacy: Adequate Health Literacy (02/04/2023)     Readmission Risk Interventions    06/08/2023    2:52 PM 12/09/2021    8:42 AM 11/25/2021    1:34 PM  Readmission Risk Prevention Plan  Transportation Screening Complete Complete Complete  HRI or Home Care Consult Complete    Social Work Consult for Recovery Care Planning/Counseling Complete    Palliative Care Screening Not Applicable    Medication Review Oceanographer) Complete Complete Complete  PCP or Specialist appointment within 3-5 days of discharge   Not Complete  HRI or Home Care Consult  Complete Complete  SW Recovery Care/Counseling Consult  Complete Complete  Palliative Care Screening  Not Applicable Not Applicable  Skilled Nursing Facility  Not Applicable Not Applicable

## 2023-07-15 NOTE — Discharge Summary (Signed)
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 for input(s): PROBNP in the last 8760 hours. Coagulation Profile: No results for input(s): INR, PROTIME in the last 168 hours.  Thyroid  Function Tests: No results for input(s): TSH, T4TOTAL, FREET4, T3FREE, THYROIDAB in the last 72 hours. Lipid Profile: No results for input(s): CHOL, HDL, LDLCALC, TRIG, CHOLHDL, LDLDIRECT in the last 72 hours. Anemia Panel: No results for input(s): VITAMINB12, FOLATE, FERRITIN, TIBC, IRON, RETICCTPCT in the last 72 hours. Urine analysis:    Component Value Date/Time   COLORURINE YELLOW 07/10/2021 0750   APPEARANCEUR CLEAR 07/10/2021 0750   APPEARANCEUR Clear  04/18/2021 1539   LABSPEC >1.030 (H) 07/10/2021 0750   PHURINE 5.5 07/10/2021 0750   GLUCOSEU 100 (A) 07/10/2021 0750   HGBUR TRACE (A) 07/10/2021 0750   BILIRUBINUR NEGATIVE 07/10/2021 0750   BILIRUBINUR Negative 04/18/2021 1539   KETONESUR NEGATIVE 07/10/2021 0750   PROTEINUR 30 (A) 07/10/2021 0750   UROBILINOGEN 0.2 06/22/2014 1918   NITRITE NEGATIVE 07/10/2021 0750   LEUKOCYTESUR NEGATIVE 07/10/2021 0750   Sepsis Labs: Invalid input(s): PROCALCITONIN, LACTICIDVEN   SIGNED:  Krew Hortman T Marquet Faircloth, MD  [facility] 07/15/2023, 10:36 AM

## 2023-07-15 NOTE — Progress Notes (Signed)
 Called report to [place] at [facility].

## 2023-07-17 DIAGNOSIS — I5032 Chronic diastolic (congestive) heart failure: Secondary | ICD-10-CM | POA: Diagnosis not present

## 2023-07-17 DIAGNOSIS — E441 Mild protein-calorie malnutrition: Secondary | ICD-10-CM | POA: Diagnosis not present

## 2023-07-17 DIAGNOSIS — G9341 Metabolic encephalopathy: Secondary | ICD-10-CM | POA: Diagnosis not present

## 2023-07-17 DIAGNOSIS — S52592D Other fractures of lower end of left radius, subsequent encounter for closed fracture with routine healing: Secondary | ICD-10-CM | POA: Diagnosis not present

## 2023-07-17 DIAGNOSIS — R5381 Other malaise: Secondary | ICD-10-CM | POA: Diagnosis not present

## 2023-07-17 DIAGNOSIS — R1310 Dysphagia, unspecified: Secondary | ICD-10-CM | POA: Diagnosis not present

## 2023-07-17 DIAGNOSIS — S82112D Displaced fracture of left tibial spine, subsequent encounter for closed fracture with routine healing: Secondary | ICD-10-CM | POA: Diagnosis not present

## 2023-07-17 DIAGNOSIS — D649 Anemia, unspecified: Secondary | ICD-10-CM | POA: Diagnosis not present

## 2023-07-17 DIAGNOSIS — J9600 Acute respiratory failure, unspecified whether with hypoxia or hypercapnia: Secondary | ICD-10-CM | POA: Diagnosis not present

## 2023-07-17 DIAGNOSIS — S52002G Unspecified fracture of upper end of left ulna, subsequent encounter for closed fracture with delayed healing: Secondary | ICD-10-CM | POA: Diagnosis not present

## 2023-07-20 DIAGNOSIS — R5381 Other malaise: Secondary | ICD-10-CM | POA: Diagnosis not present

## 2023-07-20 DIAGNOSIS — G9341 Metabolic encephalopathy: Secondary | ICD-10-CM | POA: Diagnosis not present

## 2023-07-21 DIAGNOSIS — S82002A Unspecified fracture of left patella, initial encounter for closed fracture: Secondary | ICD-10-CM | POA: Diagnosis not present

## 2023-07-21 DIAGNOSIS — S6292XD Unspecified fracture of left wrist and hand, subsequent encounter for fracture with routine healing: Secondary | ICD-10-CM | POA: Diagnosis not present

## 2023-07-21 DIAGNOSIS — S52502D Unspecified fracture of the lower end of left radius, subsequent encounter for closed fracture with routine healing: Secondary | ICD-10-CM | POA: Diagnosis not present

## 2023-07-21 DIAGNOSIS — S62327A Displaced fracture of shaft of fifth metacarpal bone, left hand, initial encounter for closed fracture: Secondary | ICD-10-CM | POA: Diagnosis not present

## 2023-07-21 DIAGNOSIS — M25561 Pain in right knee: Secondary | ICD-10-CM | POA: Diagnosis not present

## 2023-07-21 DIAGNOSIS — S62327D Displaced fracture of shaft of fifth metacarpal bone, left hand, subsequent encounter for fracture with routine healing: Secondary | ICD-10-CM | POA: Diagnosis not present

## 2023-07-21 DIAGNOSIS — S52602A Unspecified fracture of lower end of left ulna, initial encounter for closed fracture: Secondary | ICD-10-CM | POA: Diagnosis not present

## 2023-07-21 DIAGNOSIS — S52322D Displaced transverse fracture of shaft of left radius, subsequent encounter for closed fracture with routine healing: Secondary | ICD-10-CM | POA: Diagnosis not present

## 2023-07-22 ENCOUNTER — Ambulatory Visit: Payer: Medicare HMO | Attending: Nurse Practitioner | Admitting: Nurse Practitioner

## 2023-07-22 ENCOUNTER — Encounter: Payer: Self-pay | Admitting: Nurse Practitioner

## 2023-07-22 ENCOUNTER — Ambulatory Visit: Payer: Medicare HMO | Admitting: Nurse Practitioner

## 2023-07-22 ENCOUNTER — Telehealth: Payer: Self-pay | Admitting: Nurse Practitioner

## 2023-07-22 VITALS — BP 120/58 | HR 61 | Ht 69.0 in

## 2023-07-22 DIAGNOSIS — N1831 Chronic kidney disease, stage 3a: Secondary | ICD-10-CM | POA: Diagnosis not present

## 2023-07-22 DIAGNOSIS — R0602 Shortness of breath: Secondary | ICD-10-CM

## 2023-07-22 DIAGNOSIS — E782 Mixed hyperlipidemia: Secondary | ICD-10-CM

## 2023-07-22 DIAGNOSIS — R7989 Other specified abnormal findings of blood chemistry: Secondary | ICD-10-CM | POA: Diagnosis not present

## 2023-07-22 DIAGNOSIS — I2510 Atherosclerotic heart disease of native coronary artery without angina pectoris: Secondary | ICD-10-CM

## 2023-07-22 DIAGNOSIS — J449 Chronic obstructive pulmonary disease, unspecified: Secondary | ICD-10-CM | POA: Diagnosis not present

## 2023-07-22 DIAGNOSIS — I10 Essential (primary) hypertension: Secondary | ICD-10-CM

## 2023-07-22 DIAGNOSIS — R0789 Other chest pain: Secondary | ICD-10-CM | POA: Diagnosis not present

## 2023-07-22 DIAGNOSIS — I480 Paroxysmal atrial fibrillation: Secondary | ICD-10-CM

## 2023-07-22 NOTE — Patient Instructions (Signed)
Medication Instructions:  Your physician recommends that you continue on your current medications as directed. Please refer to the Current Medication list given to you today.  *If you need a refill on your cardiac medications before your next appointment, please call your pharmacy*   Lab Work: BNP Thyroid D-Dimer CMET CBC  If you have labs (blood work) drawn today and your tests are completely normal, you will receive your results only by: MyChart Message (if you have MyChart) OR A paper copy in the mail If you have any lab test that is abnormal or we need to change your treatment, we will call you to review the results.   Testing/Procedures: None   Follow-Up: At [facility], you and your health needs are our priority.  As part of our continuing mission to provide you with exceptional heart care, we have created designated Provider Care Teams.  These Care Teams include your primary Cardiologist (physician) and Advanced Practice Providers (APPs -  Physician Assistants and Nurse Practitioners) who all work together to provide you with the care you need, when you need it.  We recommend signing up for the patient portal called "MyChart".  Sign up information is provided on this After Visit Summary.  MyChart is used to connect with patients for Virtual Visits (Telemedicine).  Patients are able to view lab/test results, encounter notes, upcoming appointments, etc.  Non-urgent messages can be sent to your provider as well.   To learn more about what you can do with MyChart, go to [email or web address].    Your next appointment:   3-4 week(s)  Provider:   Sharlene Dory, NP  Other Instructions

## 2023-07-22 NOTE — Telephone Encounter (Signed)
Labs faxed to L. Chilton at pt's facility.

## 2023-07-22 NOTE — Telephone Encounter (Signed)
Joy with [facility] called in stating pt can have labs done at their facility. She asked that the orders be faxed over.  Attn lynn clinton  Fax: [phone]
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
## 2023-07-24 LAB — TSH: TSH: 0.17 — AB (ref 0.41–5.90)

## 2023-07-26 ENCOUNTER — Other Ambulatory Visit: Payer: Self-pay | Admitting: Family

## 2023-07-26 DIAGNOSIS — F419 Anxiety disorder, unspecified: Secondary | ICD-10-CM

## 2023-07-28 DIAGNOSIS — I2510 Atherosclerotic heart disease of native coronary artery without angina pectoris: Secondary | ICD-10-CM | POA: Diagnosis not present

## 2023-07-28 DIAGNOSIS — Z743 Need for continuous supervision: Secondary | ICD-10-CM | POA: Diagnosis not present

## 2023-07-28 DIAGNOSIS — R5381 Other malaise: Secondary | ICD-10-CM | POA: Diagnosis not present

## 2023-07-28 DIAGNOSIS — Z7951 Long term (current) use of inhaled steroids: Secondary | ICD-10-CM | POA: Diagnosis not present

## 2023-07-28 DIAGNOSIS — I5030 Unspecified diastolic (congestive) heart failure: Secondary | ICD-10-CM | POA: Diagnosis not present

## 2023-07-28 DIAGNOSIS — E1169 Type 2 diabetes mellitus with other specified complication: Secondary | ICD-10-CM | POA: Diagnosis not present

## 2023-07-28 DIAGNOSIS — Z888 Allergy status to other drugs, medicaments and biological substances status: Secondary | ICD-10-CM | POA: Diagnosis not present

## 2023-07-28 DIAGNOSIS — Z7982 Long term (current) use of aspirin: Secondary | ICD-10-CM | POA: Diagnosis not present

## 2023-07-28 DIAGNOSIS — I10 Essential (primary) hypertension: Secondary | ICD-10-CM | POA: Diagnosis not present

## 2023-07-28 DIAGNOSIS — D649 Anemia, unspecified: Secondary | ICD-10-CM | POA: Diagnosis not present

## 2023-07-28 DIAGNOSIS — Z1152 Encounter for screening for COVID-19: Secondary | ICD-10-CM | POA: Diagnosis not present

## 2023-07-28 DIAGNOSIS — Z886 Allergy status to analgesic agent status: Secondary | ICD-10-CM | POA: Diagnosis not present

## 2023-07-28 DIAGNOSIS — J9610 Chronic respiratory failure, unspecified whether with hypoxia or hypercapnia: Secondary | ICD-10-CM | POA: Diagnosis not present

## 2023-07-28 DIAGNOSIS — E1129 Type 2 diabetes mellitus with other diabetic kidney complication: Secondary | ICD-10-CM | POA: Diagnosis not present

## 2023-07-28 DIAGNOSIS — I700 Atherosclerosis of aorta: Secondary | ICD-10-CM | POA: Diagnosis not present

## 2023-07-28 DIAGNOSIS — I110 Hypertensive heart disease with heart failure: Secondary | ICD-10-CM | POA: Diagnosis not present

## 2023-07-28 DIAGNOSIS — I5033 Acute on chronic diastolic (congestive) heart failure: Secondary | ICD-10-CM | POA: Diagnosis not present

## 2023-07-28 DIAGNOSIS — J189 Pneumonia, unspecified organism: Secondary | ICD-10-CM | POA: Diagnosis not present

## 2023-07-28 DIAGNOSIS — Z794 Long term (current) use of insulin: Secondary | ICD-10-CM | POA: Diagnosis not present

## 2023-07-28 DIAGNOSIS — J9621 Acute and chronic respiratory failure with hypoxia: Secondary | ICD-10-CM | POA: Diagnosis not present

## 2023-07-28 DIAGNOSIS — Z881 Allergy status to other antibiotic agents status: Secondary | ICD-10-CM | POA: Diagnosis not present

## 2023-07-28 DIAGNOSIS — F339 Major depressive disorder, recurrent, unspecified: Secondary | ICD-10-CM | POA: Diagnosis not present

## 2023-07-28 DIAGNOSIS — Z79899 Other long term (current) drug therapy: Secondary | ICD-10-CM | POA: Diagnosis not present

## 2023-07-28 DIAGNOSIS — I480 Paroxysmal atrial fibrillation: Secondary | ICD-10-CM | POA: Diagnosis not present

## 2023-07-28 DIAGNOSIS — R609 Edema, unspecified: Secondary | ICD-10-CM | POA: Diagnosis not present

## 2023-07-28 DIAGNOSIS — J440 Chronic obstructive pulmonary disease with acute lower respiratory infection: Secondary | ICD-10-CM | POA: Diagnosis not present

## 2023-07-28 DIAGNOSIS — R1312 Dysphagia, oropharyngeal phase: Secondary | ICD-10-CM | POA: Diagnosis not present

## 2023-07-28 DIAGNOSIS — M6281 Muscle weakness (generalized): Secondary | ICD-10-CM | POA: Diagnosis not present

## 2023-07-28 DIAGNOSIS — S52601D Unspecified fracture of lower end of right ulna, subsequent encounter for closed fracture with routine healing: Secondary | ICD-10-CM | POA: Diagnosis not present

## 2023-07-28 DIAGNOSIS — Z8709 Personal history of other diseases of the respiratory system: Secondary | ICD-10-CM | POA: Diagnosis not present

## 2023-07-28 DIAGNOSIS — F0280 Dementia in other diseases classified elsewhere without behavioral disturbance: Secondary | ICD-10-CM | POA: Diagnosis not present

## 2023-07-28 DIAGNOSIS — R7981 Abnormal blood-gas level: Secondary | ICD-10-CM | POA: Diagnosis not present

## 2023-07-28 DIAGNOSIS — Z20822 Contact with and (suspected) exposure to covid-19: Secondary | ICD-10-CM | POA: Diagnosis not present

## 2023-07-28 DIAGNOSIS — R0600 Dyspnea, unspecified: Secondary | ICD-10-CM | POA: Diagnosis not present

## 2023-07-28 DIAGNOSIS — J441 Chronic obstructive pulmonary disease with (acute) exacerbation: Secondary | ICD-10-CM | POA: Diagnosis not present

## 2023-07-28 DIAGNOSIS — Z681 Body mass index (BMI) 19 or less, adult: Secondary | ICD-10-CM | POA: Diagnosis not present

## 2023-07-28 DIAGNOSIS — R918 Other nonspecific abnormal finding of lung field: Secondary | ICD-10-CM | POA: Diagnosis not present

## 2023-07-28 DIAGNOSIS — R001 Bradycardia, unspecified: Secondary | ICD-10-CM | POA: Diagnosis not present

## 2023-07-28 DIAGNOSIS — R0689 Other abnormalities of breathing: Secondary | ICD-10-CM | POA: Diagnosis not present

## 2023-07-28 DIAGNOSIS — Z9981 Dependence on supplemental oxygen: Secondary | ICD-10-CM | POA: Diagnosis not present

## 2023-07-28 DIAGNOSIS — S52592D Other fractures of lower end of left radius, subsequent encounter for closed fracture with routine healing: Secondary | ICD-10-CM | POA: Diagnosis not present

## 2023-07-28 DIAGNOSIS — F419 Anxiety disorder, unspecified: Secondary | ICD-10-CM | POA: Diagnosis not present

## 2023-07-28 DIAGNOSIS — R069 Unspecified abnormalities of breathing: Secondary | ICD-10-CM | POA: Diagnosis not present

## 2023-07-28 DIAGNOSIS — I2489 Other forms of acute ischemic heart disease: Secondary | ICD-10-CM | POA: Diagnosis not present

## 2023-07-28 DIAGNOSIS — N179 Acute kidney failure, unspecified: Secondary | ICD-10-CM | POA: Diagnosis not present

## 2023-07-28 DIAGNOSIS — I5032 Chronic diastolic (congestive) heart failure: Secondary | ICD-10-CM | POA: Diagnosis not present

## 2023-07-28 DIAGNOSIS — R410 Disorientation, unspecified: Secondary | ICD-10-CM | POA: Diagnosis not present

## 2023-07-28 DIAGNOSIS — I509 Heart failure, unspecified: Secondary | ICD-10-CM | POA: Diagnosis not present

## 2023-07-28 DIAGNOSIS — K219 Gastro-esophageal reflux disease without esophagitis: Secondary | ICD-10-CM | POA: Diagnosis not present

## 2023-07-28 DIAGNOSIS — R41841 Cognitive communication deficit: Secondary | ICD-10-CM | POA: Diagnosis not present

## 2023-07-28 DIAGNOSIS — J9600 Acute respiratory failure, unspecified whether with hypoxia or hypercapnia: Secondary | ICD-10-CM | POA: Diagnosis not present

## 2023-07-28 DIAGNOSIS — E785 Hyperlipidemia, unspecified: Secondary | ICD-10-CM | POA: Diagnosis not present

## 2023-07-28 DIAGNOSIS — N3281 Overactive bladder: Secondary | ICD-10-CM | POA: Diagnosis not present

## 2023-07-28 DIAGNOSIS — E119 Type 2 diabetes mellitus without complications: Secondary | ICD-10-CM | POA: Diagnosis not present

## 2023-07-28 DIAGNOSIS — R262 Difficulty in walking, not elsewhere classified: Secondary | ICD-10-CM | POA: Diagnosis not present

## 2023-07-28 DIAGNOSIS — E440 Moderate protein-calorie malnutrition: Secondary | ICD-10-CM | POA: Diagnosis not present

## 2023-07-28 DIAGNOSIS — J449 Chronic obstructive pulmonary disease, unspecified: Secondary | ICD-10-CM | POA: Diagnosis not present

## 2023-07-28 DIAGNOSIS — G309 Alzheimer's disease, unspecified: Secondary | ICD-10-CM | POA: Diagnosis not present

## 2023-07-28 DIAGNOSIS — E441 Mild protein-calorie malnutrition: Secondary | ICD-10-CM | POA: Diagnosis not present

## 2023-07-28 DIAGNOSIS — I4892 Unspecified atrial flutter: Secondary | ICD-10-CM | POA: Diagnosis not present

## 2023-07-28 DIAGNOSIS — R0602 Shortness of breath: Secondary | ICD-10-CM | POA: Diagnosis not present

## 2023-07-28 DIAGNOSIS — J9622 Acute and chronic respiratory failure with hypercapnia: Secondary | ICD-10-CM | POA: Diagnosis not present

## 2023-07-28 DIAGNOSIS — Z7901 Long term (current) use of anticoagulants: Secondary | ICD-10-CM | POA: Diagnosis not present

## 2023-07-28 DIAGNOSIS — I959 Hypotension, unspecified: Secondary | ICD-10-CM | POA: Diagnosis not present

## 2023-07-28 DIAGNOSIS — Z792 Long term (current) use of antibiotics: Secondary | ICD-10-CM | POA: Diagnosis not present

## 2023-07-28 DIAGNOSIS — R4182 Altered mental status, unspecified: Secondary | ICD-10-CM | POA: Diagnosis not present

## 2023-07-28 DIAGNOSIS — F17210 Nicotine dependence, cigarettes, uncomplicated: Secondary | ICD-10-CM | POA: Diagnosis not present

## 2023-07-28 DIAGNOSIS — S82112D Displaced fracture of left tibial spine, subsequent encounter for closed fracture with routine healing: Secondary | ICD-10-CM | POA: Diagnosis not present

## 2023-07-28 DIAGNOSIS — R059 Cough, unspecified: Secondary | ICD-10-CM | POA: Diagnosis not present

## 2023-07-28 DIAGNOSIS — J439 Emphysema, unspecified: Secondary | ICD-10-CM | POA: Diagnosis not present

## 2023-07-28 DIAGNOSIS — R64 Cachexia: Secondary | ICD-10-CM | POA: Diagnosis not present

## 2023-07-28 DIAGNOSIS — I2699 Other pulmonary embolism without acute cor pulmonale: Secondary | ICD-10-CM | POA: Diagnosis not present

## 2023-07-28 DIAGNOSIS — E669 Obesity, unspecified: Secondary | ICD-10-CM | POA: Diagnosis not present

## 2023-07-29 ENCOUNTER — Other Ambulatory Visit: Payer: Self-pay | Admitting: *Deleted

## 2023-07-29 DIAGNOSIS — E669 Obesity, unspecified: Secondary | ICD-10-CM | POA: Diagnosis not present

## 2023-07-29 DIAGNOSIS — Z681 Body mass index (BMI) 19 or less, adult: Secondary | ICD-10-CM | POA: Diagnosis not present

## 2023-07-29 DIAGNOSIS — Z792 Long term (current) use of antibiotics: Secondary | ICD-10-CM | POA: Diagnosis not present

## 2023-07-29 DIAGNOSIS — E1169 Type 2 diabetes mellitus with other specified complication: Secondary | ICD-10-CM | POA: Diagnosis not present

## 2023-07-29 DIAGNOSIS — I2510 Atherosclerotic heart disease of native coronary artery without angina pectoris: Secondary | ICD-10-CM | POA: Diagnosis not present

## 2023-07-29 DIAGNOSIS — Z7982 Long term (current) use of aspirin: Secondary | ICD-10-CM | POA: Diagnosis not present

## 2023-07-29 DIAGNOSIS — I5030 Unspecified diastolic (congestive) heart failure: Secondary | ICD-10-CM | POA: Diagnosis not present

## 2023-07-29 DIAGNOSIS — J9622 Acute and chronic respiratory failure with hypercapnia: Secondary | ICD-10-CM | POA: Diagnosis not present

## 2023-07-29 DIAGNOSIS — J9621 Acute and chronic respiratory failure with hypoxia: Secondary | ICD-10-CM | POA: Diagnosis not present

## 2023-07-29 DIAGNOSIS — Z794 Long term (current) use of insulin: Secondary | ICD-10-CM | POA: Diagnosis not present

## 2023-07-29 DIAGNOSIS — I110 Hypertensive heart disease with heart failure: Secondary | ICD-10-CM | POA: Diagnosis not present

## 2023-07-29 DIAGNOSIS — Z79899 Other long term (current) drug therapy: Secondary | ICD-10-CM | POA: Diagnosis not present

## 2023-07-29 NOTE — Patient Outreach (Signed)
Per [facility] Mr. Swager currently resides in [facility]. Writer has been following for potential chronic care management needs as benefit of health plan and PCP while he was in [facility]. Noted Mr. Spofford admitted to [facility] acute hospital on 07/28/23 per [facility].   Will follow if he returns to [facility] for potential complex care management needs. Will collaborate with [facility] team at that time.   Raiford Noble, MSN, RN, BSN [place]  [facility], [facility] Communities RN Post- Acute Care Manager Direct Dial: [phone]

## 2023-07-30 DIAGNOSIS — I110 Hypertensive heart disease with heart failure: Secondary | ICD-10-CM | POA: Diagnosis not present

## 2023-07-30 DIAGNOSIS — I480 Paroxysmal atrial fibrillation: Secondary | ICD-10-CM | POA: Diagnosis not present

## 2023-07-30 DIAGNOSIS — E669 Obesity, unspecified: Secondary | ICD-10-CM | POA: Diagnosis not present

## 2023-07-30 DIAGNOSIS — I509 Heart failure, unspecified: Secondary | ICD-10-CM | POA: Diagnosis not present

## 2023-07-30 DIAGNOSIS — Z7901 Long term (current) use of anticoagulants: Secondary | ICD-10-CM | POA: Diagnosis not present

## 2023-07-30 DIAGNOSIS — Z9981 Dependence on supplemental oxygen: Secondary | ICD-10-CM | POA: Diagnosis not present

## 2023-07-30 DIAGNOSIS — I2510 Atherosclerotic heart disease of native coronary artery without angina pectoris: Secondary | ICD-10-CM | POA: Diagnosis not present

## 2023-07-30 DIAGNOSIS — E119 Type 2 diabetes mellitus without complications: Secondary | ICD-10-CM | POA: Diagnosis not present

## 2023-07-31 DIAGNOSIS — R0600 Dyspnea, unspecified: Secondary | ICD-10-CM | POA: Diagnosis not present

## 2023-07-31 DIAGNOSIS — R4182 Altered mental status, unspecified: Secondary | ICD-10-CM | POA: Diagnosis not present

## 2023-07-31 DIAGNOSIS — R918 Other nonspecific abnormal finding of lung field: Secondary | ICD-10-CM | POA: Diagnosis not present

## 2023-07-31 DIAGNOSIS — J439 Emphysema, unspecified: Secondary | ICD-10-CM | POA: Diagnosis not present

## 2023-08-01 DIAGNOSIS — I2510 Atherosclerotic heart disease of native coronary artery without angina pectoris: Secondary | ICD-10-CM | POA: Diagnosis not present

## 2023-08-01 DIAGNOSIS — R410 Disorientation, unspecified: Secondary | ICD-10-CM | POA: Diagnosis not present

## 2023-08-01 DIAGNOSIS — I5033 Acute on chronic diastolic (congestive) heart failure: Secondary | ICD-10-CM | POA: Diagnosis not present

## 2023-08-01 DIAGNOSIS — R0602 Shortness of breath: Secondary | ICD-10-CM | POA: Diagnosis not present

## 2023-08-01 DIAGNOSIS — Z79899 Other long term (current) drug therapy: Secondary | ICD-10-CM | POA: Diagnosis not present

## 2023-08-01 DIAGNOSIS — E669 Obesity, unspecified: Secondary | ICD-10-CM | POA: Diagnosis not present

## 2023-08-01 DIAGNOSIS — I110 Hypertensive heart disease with heart failure: Secondary | ICD-10-CM | POA: Diagnosis not present

## 2023-08-01 DIAGNOSIS — I480 Paroxysmal atrial fibrillation: Secondary | ICD-10-CM | POA: Diagnosis not present

## 2023-08-01 DIAGNOSIS — E1169 Type 2 diabetes mellitus with other specified complication: Secondary | ICD-10-CM | POA: Diagnosis not present

## 2023-08-05 DIAGNOSIS — R262 Difficulty in walking, not elsewhere classified: Secondary | ICD-10-CM | POA: Diagnosis not present

## 2023-08-05 DIAGNOSIS — R079 Chest pain, unspecified: Secondary | ICD-10-CM | POA: Diagnosis not present

## 2023-08-05 DIAGNOSIS — E441 Mild protein-calorie malnutrition: Secondary | ICD-10-CM | POA: Diagnosis not present

## 2023-08-05 DIAGNOSIS — I110 Hypertensive heart disease with heart failure: Secondary | ICD-10-CM | POA: Diagnosis not present

## 2023-08-05 DIAGNOSIS — M6281 Muscle weakness (generalized): Secondary | ICD-10-CM | POA: Diagnosis not present

## 2023-08-05 DIAGNOSIS — S62327A Displaced fracture of shaft of fifth metacarpal bone, left hand, initial encounter for closed fracture: Secondary | ICD-10-CM | POA: Diagnosis not present

## 2023-08-05 DIAGNOSIS — N1831 Chronic kidney disease, stage 3a: Secondary | ICD-10-CM | POA: Diagnosis not present

## 2023-08-05 DIAGNOSIS — I2699 Other pulmonary embolism without acute cor pulmonale: Secondary | ICD-10-CM | POA: Diagnosis not present

## 2023-08-05 DIAGNOSIS — M5104 Intervertebral disc disorders with myelopathy, thoracic region: Secondary | ICD-10-CM | POA: Diagnosis not present

## 2023-08-05 DIAGNOSIS — J189 Pneumonia, unspecified organism: Secondary | ICD-10-CM | POA: Diagnosis not present

## 2023-08-05 DIAGNOSIS — E669 Obesity, unspecified: Secondary | ICD-10-CM | POA: Diagnosis not present

## 2023-08-05 DIAGNOSIS — S82142D Displaced bicondylar fracture of left tibia, subsequent encounter for closed fracture with routine healing: Secondary | ICD-10-CM | POA: Diagnosis not present

## 2023-08-05 DIAGNOSIS — R5381 Other malaise: Secondary | ICD-10-CM | POA: Diagnosis not present

## 2023-08-05 DIAGNOSIS — S52592D Other fractures of lower end of left radius, subsequent encounter for closed fracture with routine healing: Secondary | ICD-10-CM | POA: Diagnosis not present

## 2023-08-05 DIAGNOSIS — J9612 Chronic respiratory failure with hypercapnia: Secondary | ICD-10-CM | POA: Diagnosis not present

## 2023-08-05 DIAGNOSIS — N1832 Chronic kidney disease, stage 3b: Secondary | ICD-10-CM | POA: Diagnosis not present

## 2023-08-05 DIAGNOSIS — R41841 Cognitive communication deficit: Secondary | ICD-10-CM | POA: Diagnosis not present

## 2023-08-05 DIAGNOSIS — I5032 Chronic diastolic (congestive) heart failure: Secondary | ICD-10-CM | POA: Diagnosis not present

## 2023-08-05 DIAGNOSIS — G8929 Other chronic pain: Secondary | ICD-10-CM | POA: Diagnosis not present

## 2023-08-05 DIAGNOSIS — J449 Chronic obstructive pulmonary disease, unspecified: Secondary | ICD-10-CM | POA: Diagnosis not present

## 2023-08-05 DIAGNOSIS — S52572A Other intraarticular fracture of lower end of left radius, initial encounter for closed fracture: Secondary | ICD-10-CM | POA: Diagnosis not present

## 2023-08-05 DIAGNOSIS — R2231 Localized swelling, mass and lump, right upper limb: Secondary | ICD-10-CM | POA: Diagnosis not present

## 2023-08-05 DIAGNOSIS — I25119 Atherosclerotic heart disease of native coronary artery with unspecified angina pectoris: Secondary | ICD-10-CM | POA: Diagnosis not present

## 2023-08-05 DIAGNOSIS — R0602 Shortness of breath: Secondary | ICD-10-CM | POA: Diagnosis not present

## 2023-08-05 DIAGNOSIS — J441 Chronic obstructive pulmonary disease with (acute) exacerbation: Secondary | ICD-10-CM | POA: Diagnosis not present

## 2023-08-05 DIAGNOSIS — N179 Acute kidney failure, unspecified: Secondary | ICD-10-CM | POA: Diagnosis not present

## 2023-08-05 DIAGNOSIS — I700 Atherosclerosis of aorta: Secondary | ICD-10-CM | POA: Diagnosis not present

## 2023-08-05 DIAGNOSIS — Z79899 Other long term (current) drug therapy: Secondary | ICD-10-CM | POA: Diagnosis not present

## 2023-08-05 DIAGNOSIS — R634 Abnormal weight loss: Secondary | ICD-10-CM | POA: Diagnosis not present

## 2023-08-05 DIAGNOSIS — I130 Hypertensive heart and chronic kidney disease with heart failure and stage 1 through stage 4 chronic kidney disease, or unspecified chronic kidney disease: Secondary | ICD-10-CM | POA: Diagnosis not present

## 2023-08-05 DIAGNOSIS — R600 Localized edema: Secondary | ICD-10-CM | POA: Diagnosis not present

## 2023-08-05 DIAGNOSIS — I5033 Acute on chronic diastolic (congestive) heart failure: Secondary | ICD-10-CM | POA: Diagnosis not present

## 2023-08-05 DIAGNOSIS — E1122 Type 2 diabetes mellitus with diabetic chronic kidney disease: Secondary | ICD-10-CM | POA: Diagnosis not present

## 2023-08-05 DIAGNOSIS — D649 Anemia, unspecified: Secondary | ICD-10-CM | POA: Diagnosis not present

## 2023-08-05 DIAGNOSIS — E782 Mixed hyperlipidemia: Secondary | ICD-10-CM | POA: Diagnosis not present

## 2023-08-05 DIAGNOSIS — S52601D Unspecified fracture of lower end of right ulna, subsequent encounter for closed fracture with routine healing: Secondary | ICD-10-CM | POA: Diagnosis not present

## 2023-08-05 DIAGNOSIS — S5292XD Unspecified fracture of left forearm, subsequent encounter for closed fracture with routine healing: Secondary | ICD-10-CM | POA: Diagnosis not present

## 2023-08-05 DIAGNOSIS — N3281 Overactive bladder: Secondary | ICD-10-CM | POA: Diagnosis not present

## 2023-08-05 DIAGNOSIS — R0600 Dyspnea, unspecified: Secondary | ICD-10-CM | POA: Diagnosis not present

## 2023-08-05 DIAGNOSIS — E1169 Type 2 diabetes mellitus with other specified complication: Secondary | ICD-10-CM | POA: Diagnosis not present

## 2023-08-05 DIAGNOSIS — K219 Gastro-esophageal reflux disease without esophagitis: Secondary | ICD-10-CM | POA: Diagnosis not present

## 2023-08-05 DIAGNOSIS — R946 Abnormal results of thyroid function studies: Secondary | ICD-10-CM | POA: Diagnosis not present

## 2023-08-05 DIAGNOSIS — R1312 Dysphagia, oropharyngeal phase: Secondary | ICD-10-CM | POA: Diagnosis not present

## 2023-08-05 DIAGNOSIS — M25562 Pain in left knee: Secondary | ICD-10-CM | POA: Diagnosis not present

## 2023-08-05 DIAGNOSIS — I10 Essential (primary) hypertension: Secondary | ICD-10-CM | POA: Diagnosis not present

## 2023-08-05 DIAGNOSIS — J9611 Chronic respiratory failure with hypoxia: Secondary | ICD-10-CM | POA: Diagnosis not present

## 2023-08-05 DIAGNOSIS — Z515 Encounter for palliative care: Secondary | ICD-10-CM | POA: Diagnosis not present

## 2023-08-05 DIAGNOSIS — F419 Anxiety disorder, unspecified: Secondary | ICD-10-CM | POA: Diagnosis not present

## 2023-08-05 DIAGNOSIS — J9600 Acute respiratory failure, unspecified whether with hypoxia or hypercapnia: Secondary | ICD-10-CM | POA: Diagnosis not present

## 2023-08-05 DIAGNOSIS — F339 Major depressive disorder, recurrent, unspecified: Secondary | ICD-10-CM | POA: Diagnosis not present

## 2023-08-05 DIAGNOSIS — I4892 Unspecified atrial flutter: Secondary | ICD-10-CM | POA: Diagnosis not present

## 2023-08-05 DIAGNOSIS — S82112D Displaced fracture of left tibial spine, subsequent encounter for closed fracture with routine healing: Secondary | ICD-10-CM | POA: Diagnosis not present

## 2023-08-05 DIAGNOSIS — E1129 Type 2 diabetes mellitus with other diabetic kidney complication: Secondary | ICD-10-CM | POA: Diagnosis not present

## 2023-08-05 DIAGNOSIS — E559 Vitamin D deficiency, unspecified: Secondary | ICD-10-CM | POA: Diagnosis not present

## 2023-08-05 DIAGNOSIS — I509 Heart failure, unspecified: Secondary | ICD-10-CM | POA: Diagnosis not present

## 2023-08-05 DIAGNOSIS — I5030 Unspecified diastolic (congestive) heart failure: Secondary | ICD-10-CM | POA: Diagnosis not present

## 2023-08-05 DIAGNOSIS — E785 Hyperlipidemia, unspecified: Secondary | ICD-10-CM | POA: Diagnosis not present

## 2023-08-05 DIAGNOSIS — I2510 Atherosclerotic heart disease of native coronary artery without angina pectoris: Secondary | ICD-10-CM | POA: Diagnosis not present

## 2023-08-05 DIAGNOSIS — I480 Paroxysmal atrial fibrillation: Secondary | ICD-10-CM | POA: Diagnosis not present

## 2023-08-05 DIAGNOSIS — S52202D Unspecified fracture of shaft of left ulna, subsequent encounter for closed fracture with routine healing: Secondary | ICD-10-CM | POA: Diagnosis not present

## 2023-08-06 ENCOUNTER — Other Ambulatory Visit: Payer: Self-pay | Admitting: Family

## 2023-08-07 ENCOUNTER — Telehealth: Payer: Self-pay | Admitting: Family

## 2023-08-07 ENCOUNTER — Inpatient Hospital Stay: Payer: Medicare HMO | Admitting: Family Medicine

## 2023-08-07 NOTE — Telephone Encounter (Signed)
 Left detailed message for Ronald [name] at [facility] making her aware that patient is currently scheduled with PCP on 08/14/23 at 11:00 am for a Hospital Follow Up and advised for her to call our office to confirm that she received this message and to let us know if this date and time will work.   Copied from CRM [phone]. Topic: Appointments - Appointment Scheduling >> Aug 07, 2023  4:02 PM Hamdi H wrote: [name] calling from [facility] to schedule a hospital follow up for the patient. My schedule was showing soonest appt as March 25th. Please call this number to schedule a sooner appointment: [phone]

## 2023-08-10 DIAGNOSIS — J449 Chronic obstructive pulmonary disease, unspecified: Secondary | ICD-10-CM | POA: Diagnosis not present

## 2023-08-10 DIAGNOSIS — R5381 Other malaise: Secondary | ICD-10-CM | POA: Diagnosis not present

## 2023-08-12 ENCOUNTER — Encounter: Payer: Self-pay | Admitting: Family

## 2023-08-12 DIAGNOSIS — J449 Chronic obstructive pulmonary disease, unspecified: Secondary | ICD-10-CM | POA: Diagnosis not present

## 2023-08-12 DIAGNOSIS — R5381 Other malaise: Secondary | ICD-10-CM | POA: Diagnosis not present

## 2023-08-12 DIAGNOSIS — F339 Major depressive disorder, recurrent, unspecified: Secondary | ICD-10-CM | POA: Diagnosis not present

## 2023-08-12 DIAGNOSIS — R946 Abnormal results of thyroid function studies: Secondary | ICD-10-CM | POA: Diagnosis not present

## 2023-08-12 DIAGNOSIS — I10 Essential (primary) hypertension: Secondary | ICD-10-CM | POA: Diagnosis not present

## 2023-08-12 DIAGNOSIS — I480 Paroxysmal atrial fibrillation: Secondary | ICD-10-CM | POA: Diagnosis not present

## 2023-08-12 DIAGNOSIS — J441 Chronic obstructive pulmonary disease with (acute) exacerbation: Secondary | ICD-10-CM | POA: Diagnosis not present

## 2023-08-12 DIAGNOSIS — F419 Anxiety disorder, unspecified: Secondary | ICD-10-CM | POA: Diagnosis not present

## 2023-08-12 DIAGNOSIS — I5033 Acute on chronic diastolic (congestive) heart failure: Secondary | ICD-10-CM | POA: Diagnosis not present

## 2023-08-13 DIAGNOSIS — I5032 Chronic diastolic (congestive) heart failure: Secondary | ICD-10-CM | POA: Diagnosis not present

## 2023-08-13 DIAGNOSIS — R0600 Dyspnea, unspecified: Secondary | ICD-10-CM | POA: Diagnosis not present

## 2023-08-13 DIAGNOSIS — J9612 Chronic respiratory failure with hypercapnia: Secondary | ICD-10-CM | POA: Diagnosis not present

## 2023-08-13 DIAGNOSIS — Z515 Encounter for palliative care: Secondary | ICD-10-CM | POA: Diagnosis not present

## 2023-08-13 DIAGNOSIS — J9611 Chronic respiratory failure with hypoxia: Secondary | ICD-10-CM | POA: Diagnosis not present

## 2023-08-13 DIAGNOSIS — M6281 Muscle weakness (generalized): Secondary | ICD-10-CM | POA: Diagnosis not present

## 2023-08-13 DIAGNOSIS — J449 Chronic obstructive pulmonary disease, unspecified: Secondary | ICD-10-CM | POA: Diagnosis not present

## 2023-08-13 DIAGNOSIS — R634 Abnormal weight loss: Secondary | ICD-10-CM | POA: Diagnosis not present

## 2023-08-14 ENCOUNTER — Other Ambulatory Visit: Payer: Self-pay | Admitting: *Deleted

## 2023-08-14 ENCOUNTER — Ambulatory Visit: Payer: Medicare HMO | Admitting: Family

## 2023-08-14 NOTE — Patient Outreach (Signed)
 Post- Acute Care Manager follow up. Mr. Ronald [name] resides in [facility].  Screening for potential complex care management services as benefit of health plan and primary care provider. Previously engaged with [facility] team in January 2025.  Telephone call made to Mr. Ronald [name] [phone]. Mr. Ronald [name] endorses he remains in SNF and is ready go home. Reports he lived with his ex-wife prior but is not returning there post SNF. States he needs to find a place to live. Reports facility wants him to remain in SNF. Writer explained staying in SNF is the safest plan and that all parties involved want the best for him.    Update received after writer's phone call with Mr. Ronald [name]. Devonne Ronald [name], Admissions Director endorses Mr. Ronald [name] wants to return home. However, Mr. Ronald [name] son is not in agreement with Mr. Ronald [name] returning home with ex-wife. Devonne Ronald [name] reports facility is looking into alternative placement for Mr. Ronald [name].   Will continue to follow.   Raiford Noble, MSN, RN, BSN [place]  [facility], Healthy Communities RN Post- Acute Care Manager Direct Dial: [phone]

## 2023-08-16 ENCOUNTER — Other Ambulatory Visit: Payer: Self-pay | Admitting: Family

## 2023-08-16 DIAGNOSIS — I509 Heart failure, unspecified: Secondary | ICD-10-CM

## 2023-08-17 ENCOUNTER — Ambulatory Visit (INDEPENDENT_AMBULATORY_CARE_PROVIDER_SITE_OTHER)

## 2023-08-17 DIAGNOSIS — E782 Mixed hyperlipidemia: Secondary | ICD-10-CM | POA: Diagnosis not present

## 2023-08-17 DIAGNOSIS — I5032 Chronic diastolic (congestive) heart failure: Secondary | ICD-10-CM | POA: Diagnosis not present

## 2023-08-17 DIAGNOSIS — F419 Anxiety disorder, unspecified: Secondary | ICD-10-CM

## 2023-08-17 DIAGNOSIS — E1122 Type 2 diabetes mellitus with diabetic chronic kidney disease: Secondary | ICD-10-CM

## 2023-08-17 DIAGNOSIS — N1832 Chronic kidney disease, stage 3b: Secondary | ICD-10-CM | POA: Diagnosis not present

## 2023-08-17 DIAGNOSIS — S52202D Unspecified fracture of shaft of left ulna, subsequent encounter for closed fracture with routine healing: Secondary | ICD-10-CM | POA: Diagnosis not present

## 2023-08-17 DIAGNOSIS — J441 Chronic obstructive pulmonary disease with (acute) exacerbation: Secondary | ICD-10-CM

## 2023-08-17 DIAGNOSIS — J9611 Chronic respiratory failure with hypoxia: Secondary | ICD-10-CM

## 2023-08-17 DIAGNOSIS — N179 Acute kidney failure, unspecified: Secondary | ICD-10-CM | POA: Diagnosis not present

## 2023-08-17 DIAGNOSIS — S82142D Displaced bicondylar fracture of left tibia, subsequent encounter for closed fracture with routine healing: Secondary | ICD-10-CM

## 2023-08-17 DIAGNOSIS — S5292XD Unspecified fracture of left forearm, subsequent encounter for closed fracture with routine healing: Secondary | ICD-10-CM

## 2023-08-17 DIAGNOSIS — I130 Hypertensive heart and chronic kidney disease with heart failure and stage 1 through stage 4 chronic kidney disease, or unspecified chronic kidney disease: Secondary | ICD-10-CM

## 2023-08-18 DIAGNOSIS — E1129 Type 2 diabetes mellitus with other diabetic kidney complication: Secondary | ICD-10-CM | POA: Diagnosis not present

## 2023-08-18 DIAGNOSIS — J441 Chronic obstructive pulmonary disease with (acute) exacerbation: Secondary | ICD-10-CM | POA: Diagnosis not present

## 2023-08-18 DIAGNOSIS — I509 Heart failure, unspecified: Secondary | ICD-10-CM | POA: Diagnosis not present

## 2023-08-19 DIAGNOSIS — I509 Heart failure, unspecified: Secondary | ICD-10-CM | POA: Diagnosis not present

## 2023-08-19 DIAGNOSIS — F339 Major depressive disorder, recurrent, unspecified: Secondary | ICD-10-CM | POA: Diagnosis not present

## 2023-08-19 DIAGNOSIS — F419 Anxiety disorder, unspecified: Secondary | ICD-10-CM | POA: Diagnosis not present

## 2023-08-19 DIAGNOSIS — E1129 Type 2 diabetes mellitus with other diabetic kidney complication: Secondary | ICD-10-CM | POA: Diagnosis not present

## 2023-08-19 DIAGNOSIS — J441 Chronic obstructive pulmonary disease with (acute) exacerbation: Secondary | ICD-10-CM | POA: Diagnosis not present

## 2023-08-20 DIAGNOSIS — S62327A Displaced fracture of shaft of fifth metacarpal bone, left hand, initial encounter for closed fracture: Secondary | ICD-10-CM | POA: Diagnosis not present

## 2023-08-20 DIAGNOSIS — S52572A Other intraarticular fracture of lower end of left radius, initial encounter for closed fracture: Secondary | ICD-10-CM | POA: Diagnosis not present

## 2023-08-20 DIAGNOSIS — M25562 Pain in left knee: Secondary | ICD-10-CM | POA: Diagnosis not present

## 2023-08-25 DIAGNOSIS — E1129 Type 2 diabetes mellitus with other diabetic kidney complication: Secondary | ICD-10-CM | POA: Diagnosis not present

## 2023-08-25 DIAGNOSIS — J449 Chronic obstructive pulmonary disease, unspecified: Secondary | ICD-10-CM | POA: Diagnosis not present

## 2023-08-25 DIAGNOSIS — I10 Essential (primary) hypertension: Secondary | ICD-10-CM | POA: Diagnosis not present

## 2023-08-25 DIAGNOSIS — I509 Heart failure, unspecified: Secondary | ICD-10-CM | POA: Diagnosis not present

## 2023-08-25 DIAGNOSIS — I480 Paroxysmal atrial fibrillation: Secondary | ICD-10-CM | POA: Diagnosis not present

## 2023-08-25 DIAGNOSIS — I700 Atherosclerosis of aorta: Secondary | ICD-10-CM | POA: Diagnosis not present

## 2023-08-25 DIAGNOSIS — M5104 Intervertebral disc disorders with myelopathy, thoracic region: Secondary | ICD-10-CM | POA: Diagnosis not present

## 2023-08-25 DIAGNOSIS — R0602 Shortness of breath: Secondary | ICD-10-CM | POA: Diagnosis not present

## 2023-08-26 DIAGNOSIS — I10 Essential (primary) hypertension: Secondary | ICD-10-CM | POA: Diagnosis not present

## 2023-08-26 DIAGNOSIS — J449 Chronic obstructive pulmonary disease, unspecified: Secondary | ICD-10-CM | POA: Diagnosis not present

## 2023-08-26 DIAGNOSIS — I480 Paroxysmal atrial fibrillation: Secondary | ICD-10-CM | POA: Diagnosis not present

## 2023-08-26 DIAGNOSIS — E1129 Type 2 diabetes mellitus with other diabetic kidney complication: Secondary | ICD-10-CM | POA: Diagnosis not present

## 2023-08-26 DIAGNOSIS — E559 Vitamin D deficiency, unspecified: Secondary | ICD-10-CM | POA: Diagnosis not present

## 2023-08-26 DIAGNOSIS — I509 Heart failure, unspecified: Secondary | ICD-10-CM | POA: Diagnosis not present

## 2023-08-27 ENCOUNTER — Encounter: Payer: Self-pay | Admitting: *Deleted

## 2023-08-27 ENCOUNTER — Ambulatory Visit: Payer: Medicare HMO | Attending: Nurse Practitioner | Admitting: Nurse Practitioner

## 2023-08-27 ENCOUNTER — Encounter: Payer: Self-pay | Admitting: Nurse Practitioner

## 2023-08-27 VITALS — BP 118/56 | HR 80 | Ht 69.0 in | Wt 144.0 lb

## 2023-08-27 DIAGNOSIS — I5032 Chronic diastolic (congestive) heart failure: Secondary | ICD-10-CM

## 2023-08-27 DIAGNOSIS — J449 Chronic obstructive pulmonary disease, unspecified: Secondary | ICD-10-CM | POA: Diagnosis not present

## 2023-08-27 DIAGNOSIS — Z79899 Other long term (current) drug therapy: Secondary | ICD-10-CM | POA: Diagnosis not present

## 2023-08-27 DIAGNOSIS — I480 Paroxysmal atrial fibrillation: Secondary | ICD-10-CM

## 2023-08-27 DIAGNOSIS — I25119 Atherosclerotic heart disease of native coronary artery with unspecified angina pectoris: Secondary | ICD-10-CM

## 2023-08-27 DIAGNOSIS — I10 Essential (primary) hypertension: Secondary | ICD-10-CM | POA: Diagnosis not present

## 2023-08-27 DIAGNOSIS — R600 Localized edema: Secondary | ICD-10-CM | POA: Diagnosis not present

## 2023-08-27 DIAGNOSIS — N1831 Chronic kidney disease, stage 3a: Secondary | ICD-10-CM

## 2023-08-27 DIAGNOSIS — E782 Mixed hyperlipidemia: Secondary | ICD-10-CM | POA: Diagnosis not present

## 2023-08-27 DIAGNOSIS — R079 Chest pain, unspecified: Secondary | ICD-10-CM | POA: Diagnosis not present

## 2023-08-27 DIAGNOSIS — R0602 Shortness of breath: Secondary | ICD-10-CM

## 2023-08-27 MED ORDER — FUROSEMIDE 40 MG PO TABS: 40.0000 mg | ORAL_TABLET | Freq: Every day | ORAL | 3 refills | Status: DC
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
## 2023-08-27 NOTE — Patient Instructions (Signed)
 Medication Instructions:   Stop Aspirin  Increase Lasix to 40mg  daily  Continue all other medications.     Labwork:  BMET, BNP, Mg - orders given  Please do in 1-2 weeks  May have nursing facility do.   Testing/Procedures:  Your physician has requested that you have a lexiscan myoview. For further information please visit [email or web address] Please follow instruction sheet, as given.  Your physician has requested that you have a upper extremity arterial duplex. This test is an ultrasound of the arteries in the arms. It looks at arterial blood flow in the arms. There are no restrictions or special instructions. Please note: We ask at that you not bring children with you during ultrasound (echo/ vascular) testing. Due to room size and safety concerns, children are not allowed in the ultrasound rooms during exams. Our front office staff cannot provide observation of children in our lobby area while testing is being conducted. An adult accompanying a patient to their appointment will only be allowed in the ultrasound room at the discretion of the ultrasound technician under special circumstances. We apologize for any inconvenience.   Follow-Up:  Office will contact with results via phone, letter or mychart.    4-6 weeks   Any Other Special Instructions Will Be Listed Below (If Applicable).  You have been referred to:  Pulmonary   If you need a refill on your cardiac medications before your next appointment, please call your pharmacy.

## 2023-08-28 ENCOUNTER — Other Ambulatory Visit: Payer: Self-pay | Admitting: *Deleted

## 2023-08-28 DIAGNOSIS — R600 Localized edema: Secondary | ICD-10-CM

## 2023-08-28 NOTE — Progress Notes (Signed)
 I did put the "stop ASA" on his after visit summary.

## 2023-09-01 ENCOUNTER — Telehealth: Payer: Self-pay | Admitting: Nurse Practitioner

## 2023-09-01 DIAGNOSIS — I509 Heart failure, unspecified: Secondary | ICD-10-CM | POA: Diagnosis not present

## 2023-09-01 DIAGNOSIS — J449 Chronic obstructive pulmonary disease, unspecified: Secondary | ICD-10-CM | POA: Diagnosis not present

## 2023-09-01 DIAGNOSIS — I10 Essential (primary) hypertension: Secondary | ICD-10-CM | POA: Diagnosis not present

## 2023-09-01 DIAGNOSIS — I480 Paroxysmal atrial fibrillation: Secondary | ICD-10-CM | POA: Diagnosis not present

## 2023-09-01 NOTE — Telephone Encounter (Signed)
 Checking percert on the following    UE ARTERIAL DUPLEX - Scheduled for 09/17/2023 [facility]   The order was changed.

## 2023-09-02 DIAGNOSIS — I5030 Unspecified diastolic (congestive) heart failure: Secondary | ICD-10-CM | POA: Diagnosis not present

## 2023-09-02 DIAGNOSIS — R5381 Other malaise: Secondary | ICD-10-CM | POA: Diagnosis not present

## 2023-09-02 DIAGNOSIS — J449 Chronic obstructive pulmonary disease, unspecified: Secondary | ICD-10-CM | POA: Diagnosis not present

## 2023-09-02 DIAGNOSIS — I480 Paroxysmal atrial fibrillation: Secondary | ICD-10-CM | POA: Diagnosis not present

## 2023-09-02 DIAGNOSIS — E1129 Type 2 diabetes mellitus with other diabetic kidney complication: Secondary | ICD-10-CM | POA: Diagnosis not present

## 2023-09-02 DIAGNOSIS — I509 Heart failure, unspecified: Secondary | ICD-10-CM | POA: Diagnosis not present

## 2023-09-03 ENCOUNTER — Telehealth (HOSPITAL_COMMUNITY): Payer: Self-pay | Admitting: Surgery

## 2023-09-03 NOTE — Telephone Encounter (Signed)
 I spoke with the pt's nurse Flo at the [facility] to remind her of the pt's stress test tomorrow with his arrival time being 8:30.  I also told Flo that the pt needed to hold his Marcelline Deist and Lasix tomorrow morning, and to follow any other medication instructions given by the cardiologist.  Also, the pt needs to bring his albuterol inhaler.  Flo verbalized understanding of these instructions.

## 2023-09-04 ENCOUNTER — Ambulatory Visit (HOSPITAL_COMMUNITY)

## 2023-09-04 ENCOUNTER — Other Ambulatory Visit: Payer: Self-pay | Admitting: *Deleted

## 2023-09-04 ENCOUNTER — Inpatient Hospital Stay (HOSPITAL_COMMUNITY): Admission: RE | Admit: 2023-09-04 | Source: Ambulatory Visit

## 2023-09-04 DIAGNOSIS — R600 Localized edema: Secondary | ICD-10-CM

## 2023-09-09 ENCOUNTER — Other Ambulatory Visit: Payer: Self-pay | Admitting: *Deleted

## 2023-09-09 DIAGNOSIS — F419 Anxiety disorder, unspecified: Secondary | ICD-10-CM | POA: Diagnosis not present

## 2023-09-09 DIAGNOSIS — F339 Major depressive disorder, recurrent, unspecified: Secondary | ICD-10-CM | POA: Diagnosis not present

## 2023-09-09 NOTE — Patient Outreach (Signed)
 Post- Acute Care Manger follow up. Mr. Mory resides in [place]. Screening for potential complex care management services as benefit of health plan and primary care provider.  Collaboration with Devonne Doughty, Admissions Director. [employer] has issued NOMNC. Sister plans to appeal.   Will continue to follow and plan referral if discharges home.    Raiford Noble, MSN, RN, BSN [place]  [facility], [facility] RN Post- Acute Care Manager Direct Dial: [phone]

## 2023-09-10 ENCOUNTER — Ambulatory Visit: Payer: Medicare HMO | Admitting: Nurse Practitioner

## 2023-09-15 DIAGNOSIS — E1129 Type 2 diabetes mellitus with other diabetic kidney complication: Secondary | ICD-10-CM | POA: Diagnosis not present

## 2023-09-15 DIAGNOSIS — J449 Chronic obstructive pulmonary disease, unspecified: Secondary | ICD-10-CM | POA: Diagnosis not present

## 2023-09-15 DIAGNOSIS — I509 Heart failure, unspecified: Secondary | ICD-10-CM | POA: Diagnosis not present

## 2023-09-15 DIAGNOSIS — I10 Essential (primary) hypertension: Secondary | ICD-10-CM | POA: Diagnosis not present

## 2023-09-15 DIAGNOSIS — I480 Paroxysmal atrial fibrillation: Secondary | ICD-10-CM | POA: Diagnosis not present

## 2023-09-17 ENCOUNTER — Other Ambulatory Visit

## 2023-09-21 DIAGNOSIS — G8929 Other chronic pain: Secondary | ICD-10-CM | POA: Diagnosis not present

## 2023-09-21 DIAGNOSIS — I480 Paroxysmal atrial fibrillation: Secondary | ICD-10-CM | POA: Diagnosis not present

## 2023-09-21 DIAGNOSIS — I10 Essential (primary) hypertension: Secondary | ICD-10-CM | POA: Diagnosis not present

## 2023-09-21 DIAGNOSIS — R5381 Other malaise: Secondary | ICD-10-CM | POA: Diagnosis not present

## 2023-09-21 DIAGNOSIS — I509 Heart failure, unspecified: Secondary | ICD-10-CM | POA: Diagnosis not present

## 2023-09-21 DIAGNOSIS — E1129 Type 2 diabetes mellitus with other diabetic kidney complication: Secondary | ICD-10-CM | POA: Diagnosis not present

## 2023-09-23 DIAGNOSIS — F419 Anxiety disorder, unspecified: Secondary | ICD-10-CM | POA: Diagnosis not present

## 2023-09-23 DIAGNOSIS — F339 Major depressive disorder, recurrent, unspecified: Secondary | ICD-10-CM | POA: Diagnosis not present

## 2023-09-30 ENCOUNTER — Other Ambulatory Visit
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
## 2023-10-05 ENCOUNTER — Encounter: Payer: Self-pay | Admitting: Internal Medicine

## 2023-10-05 ENCOUNTER — Ambulatory Visit: Admitting: Internal Medicine

## 2023-10-05 DIAGNOSIS — E1129 Type 2 diabetes mellitus with other diabetic kidney complication: Secondary | ICD-10-CM | POA: Diagnosis not present

## 2023-10-05 DIAGNOSIS — I509 Heart failure, unspecified: Secondary | ICD-10-CM | POA: Diagnosis not present

## 2023-10-05 DIAGNOSIS — J449 Chronic obstructive pulmonary disease, unspecified: Secondary | ICD-10-CM | POA: Diagnosis not present

## 2023-10-07 DIAGNOSIS — F419 Anxiety disorder, unspecified: Secondary | ICD-10-CM | POA: Diagnosis not present

## 2023-10-07 DIAGNOSIS — F339 Major depressive disorder, recurrent, unspecified: Secondary | ICD-10-CM | POA: Diagnosis not present

## 2023-10-13 DIAGNOSIS — J9611 Chronic respiratory failure with hypoxia: Secondary | ICD-10-CM | POA: Diagnosis not present

## 2023-10-13 DIAGNOSIS — I5032 Chronic diastolic (congestive) heart failure: Secondary | ICD-10-CM | POA: Diagnosis not present

## 2023-10-13 DIAGNOSIS — R634 Abnormal weight loss: Secondary | ICD-10-CM | POA: Diagnosis not present

## 2023-10-13 DIAGNOSIS — J449 Chronic obstructive pulmonary disease, unspecified: Secondary | ICD-10-CM | POA: Diagnosis not present

## 2023-10-13 DIAGNOSIS — Z515 Encounter for palliative care: Secondary | ICD-10-CM | POA: Diagnosis not present

## 2023-10-13 DIAGNOSIS — F32A Depression, unspecified: Secondary | ICD-10-CM | POA: Diagnosis not present

## 2023-10-13 DIAGNOSIS — Z9981 Dependence on supplemental oxygen: Secondary | ICD-10-CM | POA: Diagnosis not present

## 2023-10-13 DIAGNOSIS — R0600 Dyspnea, unspecified: Secondary | ICD-10-CM | POA: Diagnosis not present

## 2023-10-21 DIAGNOSIS — F411 Generalized anxiety disorder: Secondary | ICD-10-CM | POA: Diagnosis not present

## 2023-10-21 DIAGNOSIS — F339 Major depressive disorder, recurrent, unspecified: Secondary | ICD-10-CM | POA: Diagnosis not present

## 2023-10-26 DIAGNOSIS — S52572A Other intraarticular fracture of lower end of left radius, initial encounter for closed fracture: Secondary | ICD-10-CM | POA: Diagnosis not present

## 2023-10-26 DIAGNOSIS — M25562 Pain in left knee: Secondary | ICD-10-CM | POA: Diagnosis not present

## 2023-10-26 DIAGNOSIS — S62327A Displaced fracture of shaft of fifth metacarpal bone, left hand, initial encounter for closed fracture: Secondary | ICD-10-CM | POA: Diagnosis not present

## 2023-10-27 DIAGNOSIS — R2689 Other abnormalities of gait and mobility: Secondary | ICD-10-CM | POA: Diagnosis not present

## 2023-10-27 DIAGNOSIS — I509 Heart failure, unspecified: Secondary | ICD-10-CM | POA: Diagnosis not present

## 2023-10-27 DIAGNOSIS — S52002G Unspecified fracture of upper end of left ulna, subsequent encounter for closed fracture with delayed healing: Secondary | ICD-10-CM | POA: Diagnosis not present

## 2023-10-27 DIAGNOSIS — S52592D Other fractures of lower end of left radius, subsequent encounter for closed fracture with routine healing: Secondary | ICD-10-CM | POA: Diagnosis not present

## 2023-10-27 DIAGNOSIS — S82112D Displaced fracture of left tibial spine, subsequent encounter for closed fracture with routine healing: Secondary | ICD-10-CM | POA: Diagnosis not present

## 2023-10-27 DIAGNOSIS — R278 Other lack of coordination: Secondary | ICD-10-CM | POA: Diagnosis not present

## 2023-10-27 DIAGNOSIS — M6281 Muscle weakness (generalized): Secondary | ICD-10-CM | POA: Diagnosis not present

## 2023-10-28 ENCOUNTER — Other Ambulatory Visit: Payer: Self-pay | Admitting: *Deleted

## 2023-10-28 DIAGNOSIS — F339 Major depressive disorder, recurrent, unspecified: Secondary | ICD-10-CM | POA: Diagnosis not present

## 2023-10-28 DIAGNOSIS — R2689 Other abnormalities of gait and mobility: Secondary | ICD-10-CM | POA: Diagnosis not present

## 2023-10-28 DIAGNOSIS — M6281 Muscle weakness (generalized): Secondary | ICD-10-CM | POA: Diagnosis not present

## 2023-10-28 DIAGNOSIS — R278 Other lack of coordination: Secondary | ICD-10-CM | POA: Diagnosis not present

## 2023-10-28 DIAGNOSIS — F411 Generalized anxiety disorder: Secondary | ICD-10-CM | POA: Diagnosis not present

## 2023-10-28 DIAGNOSIS — S82112D Displaced fracture of left tibial spine, subsequent encounter for closed fracture with routine healing: Secondary | ICD-10-CM | POA: Diagnosis not present

## 2023-10-28 NOTE — Patient Outreach (Signed)
 Post- Acute Care Manager follow up. Screening for potential complex care management services as benefit of health plan and PCP.   Collaboration with Levell Reach, Admissions Director. Mr. Hollenbaugh transitioned to LTC at [facility].   No identifiable complex care management needs.   Nolberto Batty, MSN, RN, BSN [place]  [facility], [facility] RN Post- Acute Care Manager Direct Dial: [phone]

## 2023-10-29 DIAGNOSIS — R278 Other lack of coordination: Secondary | ICD-10-CM | POA: Diagnosis not present

## 2023-10-29 DIAGNOSIS — M6281 Muscle weakness (generalized): Secondary | ICD-10-CM | POA: Diagnosis not present

## 2023-10-29 DIAGNOSIS — R2689 Other abnormalities of gait and mobility: Secondary | ICD-10-CM | POA: Diagnosis not present

## 2023-10-29 DIAGNOSIS — S82112D Displaced fracture of left tibial spine, subsequent encounter for closed fracture with routine healing: Secondary | ICD-10-CM | POA: Diagnosis not present

## 2023-10-30 DIAGNOSIS — S82112D Displaced fracture of left tibial spine, subsequent encounter for closed fracture with routine healing: Secondary | ICD-10-CM | POA: Diagnosis not present

## 2023-10-30 DIAGNOSIS — R2689 Other abnormalities of gait and mobility: Secondary | ICD-10-CM | POA: Diagnosis not present

## 2023-10-30 DIAGNOSIS — R278 Other lack of coordination: Secondary | ICD-10-CM | POA: Diagnosis not present

## 2023-10-30 DIAGNOSIS — M6281 Muscle weakness (generalized): Secondary | ICD-10-CM | POA: Diagnosis not present

## 2023-11-02 DIAGNOSIS — S82112D Displaced fracture of left tibial spine, subsequent encounter for closed fracture with routine healing: Secondary | ICD-10-CM | POA: Diagnosis not present

## 2023-11-02 DIAGNOSIS — M6281 Muscle weakness (generalized): Secondary | ICD-10-CM | POA: Diagnosis not present

## 2023-11-02 DIAGNOSIS — R278 Other lack of coordination: Secondary | ICD-10-CM | POA: Diagnosis not present

## 2023-11-02 DIAGNOSIS — R2689 Other abnormalities of gait and mobility: Secondary | ICD-10-CM | POA: Diagnosis not present

## 2023-11-03 DIAGNOSIS — R2689 Other abnormalities of gait and mobility: Secondary | ICD-10-CM | POA: Diagnosis not present

## 2023-11-03 DIAGNOSIS — R278 Other lack of coordination: Secondary | ICD-10-CM | POA: Diagnosis not present

## 2023-11-03 DIAGNOSIS — S82112D Displaced fracture of left tibial spine, subsequent encounter for closed fracture with routine healing: Secondary | ICD-10-CM | POA: Diagnosis not present

## 2023-11-03 DIAGNOSIS — M6281 Muscle weakness (generalized): Secondary | ICD-10-CM | POA: Diagnosis not present

## 2023-11-04 DIAGNOSIS — R2689 Other abnormalities of gait and mobility: Secondary | ICD-10-CM | POA: Diagnosis not present

## 2023-11-04 DIAGNOSIS — F419 Anxiety disorder, unspecified: Secondary | ICD-10-CM | POA: Diagnosis not present

## 2023-11-04 DIAGNOSIS — M6281 Muscle weakness (generalized): Secondary | ICD-10-CM | POA: Diagnosis not present

## 2023-11-04 DIAGNOSIS — F411 Generalized anxiety disorder: Secondary | ICD-10-CM | POA: Diagnosis not present

## 2023-11-04 DIAGNOSIS — R278 Other lack of coordination: Secondary | ICD-10-CM | POA: Diagnosis not present

## 2023-11-04 DIAGNOSIS — S82112D Displaced fracture of left tibial spine, subsequent encounter for closed fracture with routine healing: Secondary | ICD-10-CM | POA: Diagnosis not present

## 2023-11-04 DIAGNOSIS — F339 Major depressive disorder, recurrent, unspecified: Secondary | ICD-10-CM | POA: Diagnosis not present

## 2023-11-06 DIAGNOSIS — S82112D Displaced fracture of left tibial spine, subsequent encounter for closed fracture with routine healing: Secondary | ICD-10-CM | POA: Diagnosis not present

## 2023-11-06 DIAGNOSIS — R2689 Other abnormalities of gait and mobility: Secondary | ICD-10-CM | POA: Diagnosis not present

## 2023-11-06 DIAGNOSIS — M6281 Muscle weakness (generalized): Secondary | ICD-10-CM | POA: Diagnosis not present

## 2023-11-06 DIAGNOSIS — R278 Other lack of coordination: Secondary | ICD-10-CM | POA: Diagnosis not present

## 2023-11-09 ENCOUNTER — Other Ambulatory Visit: Payer: Self-pay | Admitting: Family

## 2023-11-09 ENCOUNTER — Telehealth: Payer: Self-pay

## 2023-11-09 DIAGNOSIS — E119 Type 2 diabetes mellitus without complications: Secondary | ICD-10-CM

## 2023-11-09 DIAGNOSIS — J439 Emphysema, unspecified: Secondary | ICD-10-CM

## 2023-11-09 DIAGNOSIS — R278 Other lack of coordination: Secondary | ICD-10-CM | POA: Diagnosis not present

## 2023-11-09 DIAGNOSIS — M6281 Muscle weakness (generalized): Secondary | ICD-10-CM | POA: Diagnosis not present

## 2023-11-09 DIAGNOSIS — R2689 Other abnormalities of gait and mobility: Secondary | ICD-10-CM | POA: Diagnosis not present

## 2023-11-09 DIAGNOSIS — S82112D Displaced fracture of left tibial spine, subsequent encounter for closed fracture with routine healing: Secondary | ICD-10-CM | POA: Diagnosis not present

## 2023-11-09 NOTE — Telephone Encounter (Signed)
 Rec'd fax from [place] patient [place] program requesting refills for patients Farxiga  and Breztri  medications.   Please send 90 day RX's with refills to [facility] pharmacy if applicable. Thanks!

## 2023-11-09 NOTE — Telephone Encounter (Signed)
 Unable to send medications because pt's demographic not correct.

## 2023-11-10 DIAGNOSIS — R2689 Other abnormalities of gait and mobility: Secondary | ICD-10-CM | POA: Diagnosis not present

## 2023-11-10 DIAGNOSIS — R278 Other lack of coordination: Secondary | ICD-10-CM | POA: Diagnosis not present

## 2023-11-10 DIAGNOSIS — S82112D Displaced fracture of left tibial spine, subsequent encounter for closed fracture with routine healing: Secondary | ICD-10-CM | POA: Diagnosis not present

## 2023-11-10 DIAGNOSIS — M6281 Muscle weakness (generalized): Secondary | ICD-10-CM | POA: Diagnosis not present

## 2023-11-10 MED ORDER — DAPAGLIFLOZIN PROPANEDIOL 10 MG PO TABS: 10.0000 mg | ORAL_TABLET | Freq: Every day | ORAL | 5 refills | Status: DC

## 2023-11-10 MED ORDER — BREZTRI AEROSPHERE 160-9-4.8 MCG/ACT IN AERO: 2.0000 | INHALATION_SPRAY | Freq: Two times a day (BID) | RESPIRATORY_TRACT | 11 refills | Status: DC

## 2023-11-10 NOTE — Telephone Encounter (Signed)
 Prescription sent to pharmacy.

## 2023-11-10 NOTE — Telephone Encounter (Signed)
 Spoke with Phill Brazil concerning address information for Mr. Nazir.  She states he is still at the [facility] as he is unable to care for himself at this time.  Cannot manage medicines, etc.  Please contact them for the management of his medications.

## 2023-11-10 NOTE — Telephone Encounter (Signed)
 Checking on address

## 2023-11-11 DIAGNOSIS — R2689 Other abnormalities of gait and mobility: Secondary | ICD-10-CM | POA: Diagnosis not present

## 2023-11-11 DIAGNOSIS — M6281 Muscle weakness (generalized): Secondary | ICD-10-CM | POA: Diagnosis not present

## 2023-11-11 DIAGNOSIS — S82112D Displaced fracture of left tibial spine, subsequent encounter for closed fracture with routine healing: Secondary | ICD-10-CM | POA: Diagnosis not present

## 2023-11-11 DIAGNOSIS — R278 Other lack of coordination: Secondary | ICD-10-CM | POA: Diagnosis not present

## 2023-11-12 DIAGNOSIS — F411 Generalized anxiety disorder: Secondary | ICD-10-CM | POA: Diagnosis not present

## 2023-11-12 DIAGNOSIS — R278 Other lack of coordination: Secondary | ICD-10-CM | POA: Diagnosis not present

## 2023-11-12 DIAGNOSIS — F339 Major depressive disorder, recurrent, unspecified: Secondary | ICD-10-CM | POA: Diagnosis not present

## 2023-11-12 DIAGNOSIS — M6281 Muscle weakness (generalized): Secondary | ICD-10-CM | POA: Diagnosis not present

## 2023-11-12 DIAGNOSIS — S82112D Displaced fracture of left tibial spine, subsequent encounter for closed fracture with routine healing: Secondary | ICD-10-CM | POA: Diagnosis not present

## 2023-11-12 DIAGNOSIS — R2689 Other abnormalities of gait and mobility: Secondary | ICD-10-CM | POA: Diagnosis not present

## 2023-11-13 DIAGNOSIS — R278 Other lack of coordination: Secondary | ICD-10-CM | POA: Diagnosis not present

## 2023-11-13 DIAGNOSIS — R2689 Other abnormalities of gait and mobility: Secondary | ICD-10-CM | POA: Diagnosis not present

## 2023-11-13 DIAGNOSIS — S82112D Displaced fracture of left tibial spine, subsequent encounter for closed fracture with routine healing: Secondary | ICD-10-CM | POA: Diagnosis not present

## 2023-11-13 DIAGNOSIS — M6281 Muscle weakness (generalized): Secondary | ICD-10-CM | POA: Diagnosis not present

## 2023-11-16 DIAGNOSIS — R2689 Other abnormalities of gait and mobility: Secondary | ICD-10-CM | POA: Diagnosis not present

## 2023-11-16 DIAGNOSIS — M6281 Muscle weakness (generalized): Secondary | ICD-10-CM | POA: Diagnosis not present

## 2023-11-16 DIAGNOSIS — S82112D Displaced fracture of left tibial spine, subsequent encounter for closed fracture with routine healing: Secondary | ICD-10-CM | POA: Diagnosis not present

## 2023-11-16 DIAGNOSIS — R278 Other lack of coordination: Secondary | ICD-10-CM | POA: Diagnosis not present

## 2023-11-17 DIAGNOSIS — R278 Other lack of coordination: Secondary | ICD-10-CM | POA: Diagnosis not present

## 2023-11-17 DIAGNOSIS — R2689 Other abnormalities of gait and mobility: Secondary | ICD-10-CM | POA: Diagnosis not present

## 2023-11-17 DIAGNOSIS — S82112D Displaced fracture of left tibial spine, subsequent encounter for closed fracture with routine healing: Secondary | ICD-10-CM | POA: Diagnosis not present

## 2023-11-17 DIAGNOSIS — M6281 Muscle weakness (generalized): Secondary | ICD-10-CM | POA: Diagnosis not present

## 2023-11-18 DIAGNOSIS — R278 Other lack of coordination: Secondary | ICD-10-CM | POA: Diagnosis not present

## 2023-11-18 DIAGNOSIS — M6281 Muscle weakness (generalized): Secondary | ICD-10-CM | POA: Diagnosis not present

## 2023-11-18 DIAGNOSIS — S82112D Displaced fracture of left tibial spine, subsequent encounter for closed fracture with routine healing: Secondary | ICD-10-CM | POA: Diagnosis not present

## 2023-11-18 DIAGNOSIS — R2689 Other abnormalities of gait and mobility: Secondary | ICD-10-CM | POA: Diagnosis not present

## 2023-11-19 DIAGNOSIS — Z9981 Dependence on supplemental oxygen: Secondary | ICD-10-CM | POA: Diagnosis not present

## 2023-11-19 DIAGNOSIS — Z515 Encounter for palliative care: Secondary | ICD-10-CM | POA: Diagnosis not present

## 2023-11-19 DIAGNOSIS — M6281 Muscle weakness (generalized): Secondary | ICD-10-CM | POA: Diagnosis not present

## 2023-11-19 DIAGNOSIS — R2689 Other abnormalities of gait and mobility: Secondary | ICD-10-CM | POA: Diagnosis not present

## 2023-11-19 DIAGNOSIS — J449 Chronic obstructive pulmonary disease, unspecified: Secondary | ICD-10-CM | POA: Diagnosis not present

## 2023-11-19 DIAGNOSIS — I5032 Chronic diastolic (congestive) heart failure: Secondary | ICD-10-CM | POA: Diagnosis not present

## 2023-11-19 DIAGNOSIS — R278 Other lack of coordination: Secondary | ICD-10-CM | POA: Diagnosis not present

## 2023-11-19 DIAGNOSIS — Z7409 Other reduced mobility: Secondary | ICD-10-CM | POA: Diagnosis not present

## 2023-11-19 DIAGNOSIS — S82112D Displaced fracture of left tibial spine, subsequent encounter for closed fracture with routine healing: Secondary | ICD-10-CM | POA: Diagnosis not present

## 2023-11-19 DIAGNOSIS — J9611 Chronic respiratory failure with hypoxia: Secondary | ICD-10-CM | POA: Diagnosis not present

## 2023-11-20 DIAGNOSIS — M6281 Muscle weakness (generalized): Secondary | ICD-10-CM | POA: Diagnosis not present

## 2023-11-20 DIAGNOSIS — R2689 Other abnormalities of gait and mobility: Secondary | ICD-10-CM | POA: Diagnosis not present

## 2023-11-20 DIAGNOSIS — S82112D Displaced fracture of left tibial spine, subsequent encounter for closed fracture with routine healing: Secondary | ICD-10-CM | POA: Diagnosis not present

## 2023-11-20 DIAGNOSIS — R278 Other lack of coordination: Secondary | ICD-10-CM | POA: Diagnosis not present

## 2023-11-23 DIAGNOSIS — R278 Other lack of coordination: Secondary | ICD-10-CM | POA: Diagnosis not present

## 2023-11-23 DIAGNOSIS — R2689 Other abnormalities of gait and mobility: Secondary | ICD-10-CM | POA: Diagnosis not present

## 2023-11-23 DIAGNOSIS — M6281 Muscle weakness (generalized): Secondary | ICD-10-CM | POA: Diagnosis not present

## 2023-11-23 DIAGNOSIS — S82112D Displaced fracture of left tibial spine, subsequent encounter for closed fracture with routine healing: Secondary | ICD-10-CM | POA: Diagnosis not present

## 2023-11-24 DIAGNOSIS — S82112D Displaced fracture of left tibial spine, subsequent encounter for closed fracture with routine healing: Secondary | ICD-10-CM | POA: Diagnosis not present

## 2023-11-24 DIAGNOSIS — R2689 Other abnormalities of gait and mobility: Secondary | ICD-10-CM | POA: Diagnosis not present

## 2023-11-24 DIAGNOSIS — M6281 Muscle weakness (generalized): Secondary | ICD-10-CM | POA: Diagnosis not present

## 2023-11-24 DIAGNOSIS — R278 Other lack of coordination: Secondary | ICD-10-CM | POA: Diagnosis not present

## 2023-11-25 ENCOUNTER — Encounter: Admitting: Nurse Practitioner

## 2023-11-25 DIAGNOSIS — R7989 Other specified abnormal findings of blood chemistry: Secondary | ICD-10-CM

## 2023-11-25 DIAGNOSIS — S82112D Displaced fracture of left tibial spine, subsequent encounter for closed fracture with routine healing: Secondary | ICD-10-CM | POA: Diagnosis not present

## 2023-11-25 DIAGNOSIS — M6281 Muscle weakness (generalized): Secondary | ICD-10-CM | POA: Diagnosis not present

## 2023-11-25 DIAGNOSIS — H109 Unspecified conjunctivitis: Secondary | ICD-10-CM | POA: Diagnosis not present

## 2023-11-25 DIAGNOSIS — H04309 Unspecified dacryocystitis of unspecified lacrimal passage: Secondary | ICD-10-CM | POA: Diagnosis not present

## 2023-11-25 DIAGNOSIS — F419 Anxiety disorder, unspecified: Secondary | ICD-10-CM | POA: Diagnosis not present

## 2023-11-25 DIAGNOSIS — R2689 Other abnormalities of gait and mobility: Secondary | ICD-10-CM | POA: Diagnosis not present

## 2023-11-25 DIAGNOSIS — F339 Major depressive disorder, recurrent, unspecified: Secondary | ICD-10-CM | POA: Diagnosis not present

## 2023-11-25 DIAGNOSIS — R278 Other lack of coordination: Secondary | ICD-10-CM | POA: Diagnosis not present

## 2023-11-25 DIAGNOSIS — F411 Generalized anxiety disorder: Secondary | ICD-10-CM | POA: Diagnosis not present

## 2023-11-25 NOTE — Progress Notes (Unsigned)
 Erroneous encounter

## 2023-11-26 DIAGNOSIS — M6281 Muscle weakness (generalized): Secondary | ICD-10-CM | POA: Diagnosis not present

## 2023-11-26 DIAGNOSIS — R2689 Other abnormalities of gait and mobility: Secondary | ICD-10-CM | POA: Diagnosis not present

## 2023-11-26 DIAGNOSIS — S82112D Displaced fracture of left tibial spine, subsequent encounter for closed fracture with routine healing: Secondary | ICD-10-CM | POA: Diagnosis not present

## 2023-11-26 DIAGNOSIS — R278 Other lack of coordination: Secondary | ICD-10-CM | POA: Diagnosis not present

## 2023-11-27 DIAGNOSIS — S82112D Displaced fracture of left tibial spine, subsequent encounter for closed fracture with routine healing: Secondary | ICD-10-CM | POA: Diagnosis not present

## 2023-11-27 DIAGNOSIS — R278 Other lack of coordination: Secondary | ICD-10-CM | POA: Diagnosis not present

## 2023-11-27 DIAGNOSIS — R2689 Other abnormalities of gait and mobility: Secondary | ICD-10-CM | POA: Diagnosis not present

## 2023-11-27 DIAGNOSIS — M6281 Muscle weakness (generalized): Secondary | ICD-10-CM | POA: Diagnosis not present

## 2023-11-30 DIAGNOSIS — M6281 Muscle weakness (generalized): Secondary | ICD-10-CM | POA: Diagnosis not present

## 2023-11-30 DIAGNOSIS — R278 Other lack of coordination: Secondary | ICD-10-CM | POA: Diagnosis not present

## 2023-11-30 DIAGNOSIS — R2689 Other abnormalities of gait and mobility: Secondary | ICD-10-CM | POA: Diagnosis not present

## 2023-11-30 DIAGNOSIS — S82112D Displaced fracture of left tibial spine, subsequent encounter for closed fracture with routine healing: Secondary | ICD-10-CM | POA: Diagnosis not present

## 2023-12-01 DIAGNOSIS — R2689 Other abnormalities of gait and mobility: Secondary | ICD-10-CM | POA: Diagnosis not present

## 2023-12-01 DIAGNOSIS — R278 Other lack of coordination: Secondary | ICD-10-CM | POA: Diagnosis not present

## 2023-12-01 DIAGNOSIS — S82112D Displaced fracture of left tibial spine, subsequent encounter for closed fracture with routine healing: Secondary | ICD-10-CM | POA: Diagnosis not present

## 2023-12-01 DIAGNOSIS — M6281 Muscle weakness (generalized): Secondary | ICD-10-CM | POA: Diagnosis not present

## 2023-12-02 DIAGNOSIS — S82112D Displaced fracture of left tibial spine, subsequent encounter for closed fracture with routine healing: Secondary | ICD-10-CM | POA: Diagnosis not present

## 2023-12-02 DIAGNOSIS — R2689 Other abnormalities of gait and mobility: Secondary | ICD-10-CM | POA: Diagnosis not present

## 2023-12-02 DIAGNOSIS — M6281 Muscle weakness (generalized): Secondary | ICD-10-CM | POA: Diagnosis not present

## 2023-12-02 DIAGNOSIS — R278 Other lack of coordination: Secondary | ICD-10-CM | POA: Diagnosis not present

## 2023-12-03 DIAGNOSIS — R278 Other lack of coordination: Secondary | ICD-10-CM | POA: Diagnosis not present

## 2023-12-03 DIAGNOSIS — M6281 Muscle weakness (generalized): Secondary | ICD-10-CM | POA: Diagnosis not present

## 2023-12-03 DIAGNOSIS — R2689 Other abnormalities of gait and mobility: Secondary | ICD-10-CM | POA: Diagnosis not present

## 2023-12-03 DIAGNOSIS — S82112D Displaced fracture of left tibial spine, subsequent encounter for closed fracture with routine healing: Secondary | ICD-10-CM | POA: Diagnosis not present

## 2023-12-04 DIAGNOSIS — M6281 Muscle weakness (generalized): Secondary | ICD-10-CM | POA: Diagnosis not present

## 2023-12-04 DIAGNOSIS — S82112D Displaced fracture of left tibial spine, subsequent encounter for closed fracture with routine healing: Secondary | ICD-10-CM | POA: Diagnosis not present

## 2023-12-04 DIAGNOSIS — R278 Other lack of coordination: Secondary | ICD-10-CM | POA: Diagnosis not present

## 2023-12-04 DIAGNOSIS — R2689 Other abnormalities of gait and mobility: Secondary | ICD-10-CM | POA: Diagnosis not present

## 2023-12-07 DIAGNOSIS — M6281 Muscle weakness (generalized): Secondary | ICD-10-CM | POA: Diagnosis not present

## 2023-12-07 DIAGNOSIS — S82112D Displaced fracture of left tibial spine, subsequent encounter for closed fracture with routine healing: Secondary | ICD-10-CM | POA: Diagnosis not present

## 2023-12-07 DIAGNOSIS — F411 Generalized anxiety disorder: Secondary | ICD-10-CM | POA: Diagnosis not present

## 2023-12-07 DIAGNOSIS — R278 Other lack of coordination: Secondary | ICD-10-CM | POA: Diagnosis not present

## 2023-12-07 DIAGNOSIS — R2689 Other abnormalities of gait and mobility: Secondary | ICD-10-CM | POA: Diagnosis not present

## 2023-12-07 DIAGNOSIS — F339 Major depressive disorder, recurrent, unspecified: Secondary | ICD-10-CM | POA: Diagnosis not present

## 2023-12-08 DIAGNOSIS — M6281 Muscle weakness (generalized): Secondary | ICD-10-CM | POA: Diagnosis not present

## 2023-12-08 DIAGNOSIS — R278 Other lack of coordination: Secondary | ICD-10-CM | POA: Diagnosis not present

## 2023-12-08 DIAGNOSIS — M6289 Other specified disorders of muscle: Secondary | ICD-10-CM | POA: Diagnosis not present

## 2023-12-08 DIAGNOSIS — S82112D Displaced fracture of left tibial spine, subsequent encounter for closed fracture with routine healing: Secondary | ICD-10-CM | POA: Diagnosis not present

## 2023-12-08 DIAGNOSIS — R2689 Other abnormalities of gait and mobility: Secondary | ICD-10-CM | POA: Diagnosis not present

## 2023-12-09 DIAGNOSIS — M6281 Muscle weakness (generalized): Secondary | ICD-10-CM | POA: Diagnosis not present

## 2023-12-09 DIAGNOSIS — M6289 Other specified disorders of muscle: Secondary | ICD-10-CM | POA: Diagnosis not present

## 2023-12-09 DIAGNOSIS — R2689 Other abnormalities of gait and mobility: Secondary | ICD-10-CM | POA: Diagnosis not present

## 2023-12-09 DIAGNOSIS — S82112D Displaced fracture of left tibial spine, subsequent encounter for closed fracture with routine healing: Secondary | ICD-10-CM | POA: Diagnosis not present

## 2023-12-09 DIAGNOSIS — R278 Other lack of coordination: Secondary | ICD-10-CM | POA: Diagnosis not present

## 2023-12-10 DIAGNOSIS — R278 Other lack of coordination: Secondary | ICD-10-CM | POA: Diagnosis not present

## 2023-12-10 DIAGNOSIS — M6281 Muscle weakness (generalized): Secondary | ICD-10-CM | POA: Diagnosis not present

## 2023-12-10 DIAGNOSIS — S82112D Displaced fracture of left tibial spine, subsequent encounter for closed fracture with routine healing: Secondary | ICD-10-CM | POA: Diagnosis not present

## 2023-12-10 DIAGNOSIS — M6289 Other specified disorders of muscle: Secondary | ICD-10-CM | POA: Diagnosis not present

## 2023-12-10 DIAGNOSIS — R2689 Other abnormalities of gait and mobility: Secondary | ICD-10-CM | POA: Diagnosis not present

## 2023-12-11 DIAGNOSIS — S82112D Displaced fracture of left tibial spine, subsequent encounter for closed fracture with routine healing: Secondary | ICD-10-CM | POA: Diagnosis not present

## 2023-12-11 DIAGNOSIS — R278 Other lack of coordination: Secondary | ICD-10-CM | POA: Diagnosis not present

## 2023-12-11 DIAGNOSIS — M6289 Other specified disorders of muscle: Secondary | ICD-10-CM | POA: Diagnosis not present

## 2023-12-11 DIAGNOSIS — R2689 Other abnormalities of gait and mobility: Secondary | ICD-10-CM | POA: Diagnosis not present

## 2023-12-11 DIAGNOSIS — M6281 Muscle weakness (generalized): Secondary | ICD-10-CM | POA: Diagnosis not present

## 2023-12-14 DIAGNOSIS — S82112D Displaced fracture of left tibial spine, subsequent encounter for closed fracture with routine healing: Secondary | ICD-10-CM | POA: Diagnosis not present

## 2023-12-14 DIAGNOSIS — M6281 Muscle weakness (generalized): Secondary | ICD-10-CM | POA: Diagnosis not present

## 2023-12-14 DIAGNOSIS — R2689 Other abnormalities of gait and mobility: Secondary | ICD-10-CM | POA: Diagnosis not present

## 2023-12-14 DIAGNOSIS — M6289 Other specified disorders of muscle: Secondary | ICD-10-CM | POA: Diagnosis not present

## 2023-12-14 DIAGNOSIS — R278 Other lack of coordination: Secondary | ICD-10-CM | POA: Diagnosis not present

## 2023-12-15 DIAGNOSIS — R2689 Other abnormalities of gait and mobility: Secondary | ICD-10-CM | POA: Diagnosis not present

## 2023-12-15 DIAGNOSIS — M6289 Other specified disorders of muscle: Secondary | ICD-10-CM | POA: Diagnosis not present

## 2023-12-15 DIAGNOSIS — M6281 Muscle weakness (generalized): Secondary | ICD-10-CM | POA: Diagnosis not present

## 2023-12-15 DIAGNOSIS — S82112D Displaced fracture of left tibial spine, subsequent encounter for closed fracture with routine healing: Secondary | ICD-10-CM | POA: Diagnosis not present

## 2023-12-15 DIAGNOSIS — R278 Other lack of coordination: Secondary | ICD-10-CM | POA: Diagnosis not present

## 2023-12-16 DIAGNOSIS — M6289 Other specified disorders of muscle: Secondary | ICD-10-CM | POA: Diagnosis not present

## 2023-12-16 DIAGNOSIS — M6281 Muscle weakness (generalized): Secondary | ICD-10-CM | POA: Diagnosis not present

## 2023-12-16 DIAGNOSIS — R278 Other lack of coordination: Secondary | ICD-10-CM | POA: Diagnosis not present

## 2023-12-16 DIAGNOSIS — R2689 Other abnormalities of gait and mobility: Secondary | ICD-10-CM | POA: Diagnosis not present

## 2023-12-16 DIAGNOSIS — S82112D Displaced fracture of left tibial spine, subsequent encounter for closed fracture with routine healing: Secondary | ICD-10-CM | POA: Diagnosis not present

## 2023-12-21 ENCOUNTER — Ambulatory Visit (INDEPENDENT_AMBULATORY_CARE_PROVIDER_SITE_OTHER): Admitting: Internal Medicine

## 2023-12-21 ENCOUNTER — Encounter: Payer: Self-pay | Admitting: Internal Medicine

## 2023-12-21 VITALS — BP 158/68 | HR 58 | Ht 69.0 in | Wt 139.0 lb

## 2023-12-21 DIAGNOSIS — F17210 Nicotine dependence, cigarettes, uncomplicated: Secondary | ICD-10-CM

## 2023-12-21 DIAGNOSIS — J449 Chronic obstructive pulmonary disease, unspecified: Secondary | ICD-10-CM | POA: Diagnosis not present

## 2023-12-21 DIAGNOSIS — R918 Other nonspecific abnormal finding of lung field: Secondary | ICD-10-CM | POA: Diagnosis not present

## 2023-12-21 DIAGNOSIS — J9611 Chronic respiratory failure with hypoxia: Secondary | ICD-10-CM | POA: Diagnosis not present

## 2023-12-21 NOTE — Assessment & Plan Note (Signed)
 4-5 min discussion re active cigarette smoking in addition to office E&M  Ask about tobacco use:   ongoing  Advise quitting   life or breath conversation  Assess willingness:  Not committed at this point Assist in quit attempt:  Per PCP when ready Arrange follow up:   Follow up per Primary Care planned
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
## 2023-12-21 NOTE — Assessment & Plan Note (Addendum)
 Started on 02 07/19/2014  - as of 12/08/2019 using 02 at hs and prn daytime - 12/21/2023 no desats walking 150 ft RA and pt reporting no longer using 02 at hs or walking at [facility]  Appears to be doing fine s 02 at this point          Each maintenance medication was reviewed in detail including emphasizing most importantly the difference between maintenance and prns and under what circumstances the prns are to be triggered using an action plan format where appropriate.  Total time for H and P, chart review, counseling,  directly observing portions of ambulatory 02 saturation study/ and generating customized AVS unique to this office visit / same day charting = 34 min

## 2023-12-21 NOTE — Assessment & Plan Note (Signed)
 Active smoker with wt loss - MPNs Reported from outside hosp in 06/2023 but not available in epic as of 12/21/2023  - CT s contrast 12/21/2023 >>>

## 2023-12-21 NOTE — Patient Instructions (Signed)
 No change in your medications   We will order CT chest to be done at [facility] next available   Pulmonary follow up can be arranged here as needed - if you have been discharged you will need to bring all active medications with you.

## 2023-12-21 NOTE — Assessment & Plan Note (Signed)
 Active smoker/MM - 12/08/2019  After extensive coaching inhaler device,  effectiveness =    90% with elipta > try trelegy  - PFT's  04/04/20  FEV1 1.02 (33 % ) ratio 0.39  p 8 % improvement from saba p saba 6h prior to study with DLCO  9.74(39%) corrects to 2.37 (58%)  for alv volume and FV curve atypical features both on insp and exp but no true plateau  - alpha one AT phenotype 07/07/2022   MM  level 160 - 07/07/2022  After extensive coaching inhaler device,  effectiveness =    25% hfa/ 90% dpi > continue trlelegy - 07/07/2022   Walked on RA  x  3  lap(s) =  approx 450  ft  @ mod pace, stopped due to end of study s sob  with lowest 02 sats 94%  - 12/21/2023   Walked on RA  x  1  lap(s) =  approx 150   ft  @ slow/ pushing w/c  pace, lowest sat 94% before pulse ox stopped working and no symptoms at that point    Group D (now reclassified as E) in terms of symptom/risk and laba/lama/ICS  therefore appropriate rx at this point >>>  ok on present rx with major issues re access to care/ insight into self management noted today so the only way I can do f/u as out pt is if he returns with all meds in hand using a trust but verify approach to confirm accurate Medication  Reconciliation The principal here is that until we are certain that the  patients are doing what we've asked, it makes no sense to ask them to do more.   No change in rx needed from my perspective

## 2023-12-22 DIAGNOSIS — E119 Type 2 diabetes mellitus without complications: Secondary | ICD-10-CM | POA: Diagnosis not present

## 2023-12-22 DIAGNOSIS — E559 Vitamin D deficiency, unspecified: Secondary | ICD-10-CM | POA: Diagnosis not present

## 2023-12-22 DIAGNOSIS — E1129 Type 2 diabetes mellitus with other diabetic kidney complication: Secondary | ICD-10-CM | POA: Diagnosis not present

## 2023-12-22 DIAGNOSIS — I10 Essential (primary) hypertension: Secondary | ICD-10-CM | POA: Diagnosis not present

## 2023-12-23 DIAGNOSIS — F339 Major depressive disorder, recurrent, unspecified: Secondary | ICD-10-CM | POA: Diagnosis not present

## 2023-12-23 DIAGNOSIS — F419 Anxiety disorder, unspecified: Secondary | ICD-10-CM | POA: Diagnosis not present

## 2023-12-25 DIAGNOSIS — M25562 Pain in left knee: Secondary | ICD-10-CM | POA: Diagnosis not present

## 2023-12-25 DIAGNOSIS — M25532 Pain in left wrist: Secondary | ICD-10-CM | POA: Diagnosis not present

## 2023-12-28 DIAGNOSIS — I509 Heart failure, unspecified: Secondary | ICD-10-CM | POA: Diagnosis not present

## 2023-12-28 DIAGNOSIS — J449 Chronic obstructive pulmonary disease, unspecified: Secondary | ICD-10-CM | POA: Diagnosis not present

## 2023-12-28 DIAGNOSIS — E785 Hyperlipidemia, unspecified: Secondary | ICD-10-CM | POA: Diagnosis not present

## 2024-01-01 ENCOUNTER — Ambulatory Visit (HOSPITAL_COMMUNITY)
Admission: RE | Admit: 2024-01-01 | Discharge: 2024-01-01 | Disposition: A | Discharge status: Home / Self Care | Source: Ambulatory Visit | Attending: Internal Medicine | Admitting: Internal Medicine

## 2024-01-01 DIAGNOSIS — R918 Other nonspecific abnormal finding of lung field: Secondary | ICD-10-CM | POA: Diagnosis not present

## 2024-01-01 DIAGNOSIS — J439 Emphysema, unspecified: Secondary | ICD-10-CM | POA: Diagnosis not present

## 2024-01-06 DIAGNOSIS — F339 Major depressive disorder, recurrent, unspecified: Secondary | ICD-10-CM | POA: Diagnosis not present

## 2024-01-06 DIAGNOSIS — F419 Anxiety disorder, unspecified: Secondary | ICD-10-CM | POA: Diagnosis not present

## 2024-01-09 ENCOUNTER — Ambulatory Visit: Payer: Self-pay | Admitting: Internal Medicine

## 2024-01-09 DIAGNOSIS — R918 Other nonspecific abnormal finding of lung field: Secondary | ICD-10-CM

## 2024-01-12 NOTE — Progress Notes (Signed)
 I called the pt and was placed on long hold with the line ringing multiple times with no option to leave msg. Will call back

## 2024-01-13 DIAGNOSIS — F339 Major depressive disorder, recurrent, unspecified: Secondary | ICD-10-CM | POA: Diagnosis not present

## 2024-01-13 DIAGNOSIS — F411 Generalized anxiety disorder: Secondary | ICD-10-CM | POA: Diagnosis not present

## 2024-01-19 DIAGNOSIS — Z515 Encounter for palliative care: Secondary | ICD-10-CM | POA: Diagnosis not present

## 2024-01-19 DIAGNOSIS — J449 Chronic obstructive pulmonary disease, unspecified: Secondary | ICD-10-CM | POA: Diagnosis not present

## 2024-01-19 DIAGNOSIS — I5032 Chronic diastolic (congestive) heart failure: Secondary | ICD-10-CM | POA: Diagnosis not present

## 2024-01-19 DIAGNOSIS — Z7409 Other reduced mobility: Secondary | ICD-10-CM | POA: Diagnosis not present

## 2024-01-19 DIAGNOSIS — J9611 Chronic respiratory failure with hypoxia: Secondary | ICD-10-CM | POA: Diagnosis not present

## 2024-01-19 DIAGNOSIS — M6281 Muscle weakness (generalized): Secondary | ICD-10-CM | POA: Diagnosis not present

## 2024-01-19 DIAGNOSIS — Z9981 Dependence on supplemental oxygen: Secondary | ICD-10-CM | POA: Diagnosis not present

## 2024-01-19 DIAGNOSIS — L299 Pruritus, unspecified: Secondary | ICD-10-CM | POA: Diagnosis not present

## 2024-01-20 DIAGNOSIS — I509 Heart failure, unspecified: Secondary | ICD-10-CM | POA: Diagnosis not present

## 2024-01-20 DIAGNOSIS — J449 Chronic obstructive pulmonary disease, unspecified: Secondary | ICD-10-CM | POA: Diagnosis not present

## 2024-01-20 DIAGNOSIS — R911 Solitary pulmonary nodule: Secondary | ICD-10-CM | POA: Diagnosis not present

## 2024-01-20 DIAGNOSIS — I700 Atherosclerosis of aorta: Secondary | ICD-10-CM | POA: Diagnosis not present

## 2024-01-22 DIAGNOSIS — R1312 Dysphagia, oropharyngeal phase: Secondary | ICD-10-CM | POA: Diagnosis not present

## 2024-01-22 DIAGNOSIS — S82112D Displaced fracture of left tibial spine, subsequent encounter for closed fracture with routine healing: Secondary | ICD-10-CM | POA: Diagnosis not present

## 2024-01-22 DIAGNOSIS — R41841 Cognitive communication deficit: Secondary | ICD-10-CM | POA: Diagnosis not present

## 2024-01-25 DIAGNOSIS — F411 Generalized anxiety disorder: Secondary | ICD-10-CM | POA: Diagnosis not present

## 2024-01-25 DIAGNOSIS — F339 Major depressive disorder, recurrent, unspecified: Secondary | ICD-10-CM | POA: Diagnosis not present

## 2024-01-25 DIAGNOSIS — S82112D Displaced fracture of left tibial spine, subsequent encounter for closed fracture with routine healing: Secondary | ICD-10-CM | POA: Diagnosis not present

## 2024-01-25 DIAGNOSIS — R41841 Cognitive communication deficit: Secondary | ICD-10-CM | POA: Diagnosis not present

## 2024-01-25 DIAGNOSIS — R1312 Dysphagia, oropharyngeal phase: Secondary | ICD-10-CM | POA: Diagnosis not present

## 2024-01-26 DIAGNOSIS — R41841 Cognitive communication deficit: Secondary | ICD-10-CM | POA: Diagnosis not present

## 2024-01-26 DIAGNOSIS — L0390 Cellulitis, unspecified: Secondary | ICD-10-CM | POA: Diagnosis not present

## 2024-01-26 DIAGNOSIS — H04129 Dry eye syndrome of unspecified lacrimal gland: Secondary | ICD-10-CM | POA: Diagnosis not present

## 2024-01-26 DIAGNOSIS — S82112D Displaced fracture of left tibial spine, subsequent encounter for closed fracture with routine healing: Secondary | ICD-10-CM | POA: Diagnosis not present

## 2024-01-26 DIAGNOSIS — R1312 Dysphagia, oropharyngeal phase: Secondary | ICD-10-CM | POA: Diagnosis not present

## 2024-01-26 DIAGNOSIS — I509 Heart failure, unspecified: Secondary | ICD-10-CM | POA: Diagnosis not present

## 2024-01-26 DIAGNOSIS — I10 Essential (primary) hypertension: Secondary | ICD-10-CM | POA: Diagnosis not present

## 2024-01-26 DIAGNOSIS — J449 Chronic obstructive pulmonary disease, unspecified: Secondary | ICD-10-CM | POA: Diagnosis not present

## 2024-01-26 DIAGNOSIS — E1129 Type 2 diabetes mellitus with other diabetic kidney complication: Secondary | ICD-10-CM | POA: Diagnosis not present

## 2024-01-27 DIAGNOSIS — S82112D Displaced fracture of left tibial spine, subsequent encounter for closed fracture with routine healing: Secondary | ICD-10-CM | POA: Diagnosis not present

## 2024-01-27 DIAGNOSIS — R1312 Dysphagia, oropharyngeal phase: Secondary | ICD-10-CM | POA: Diagnosis not present

## 2024-01-27 DIAGNOSIS — R41841 Cognitive communication deficit: Secondary | ICD-10-CM | POA: Diagnosis not present

## 2024-01-28 DIAGNOSIS — S82112D Displaced fracture of left tibial spine, subsequent encounter for closed fracture with routine healing: Secondary | ICD-10-CM | POA: Diagnosis not present

## 2024-01-28 DIAGNOSIS — R1312 Dysphagia, oropharyngeal phase: Secondary | ICD-10-CM | POA: Diagnosis not present

## 2024-01-28 DIAGNOSIS — R41841 Cognitive communication deficit: Secondary | ICD-10-CM | POA: Diagnosis not present

## 2024-01-29 DIAGNOSIS — R1312 Dysphagia, oropharyngeal phase: Secondary | ICD-10-CM | POA: Diagnosis not present

## 2024-01-29 DIAGNOSIS — S82112D Displaced fracture of left tibial spine, subsequent encounter for closed fracture with routine healing: Secondary | ICD-10-CM | POA: Diagnosis not present

## 2024-01-29 DIAGNOSIS — R41841 Cognitive communication deficit: Secondary | ICD-10-CM | POA: Diagnosis not present

## 2024-02-01 DIAGNOSIS — S82112D Displaced fracture of left tibial spine, subsequent encounter for closed fracture with routine healing: Secondary | ICD-10-CM | POA: Diagnosis not present

## 2024-02-01 DIAGNOSIS — R1312 Dysphagia, oropharyngeal phase: Secondary | ICD-10-CM | POA: Diagnosis not present

## 2024-02-01 DIAGNOSIS — R41841 Cognitive communication deficit: Secondary | ICD-10-CM | POA: Diagnosis not present

## 2024-02-01 NOTE — Progress Notes (Signed)
 Called and spoke with the pt's nurse, Duwaine and notified of results/recs per Dr Darlean. I have placed order for the PET scan and she is aware someone will be calling for the appt to be scheduled for this.

## 2024-02-02 ENCOUNTER — Telehealth: Payer: Self-pay | Admitting: Internal Medicine

## 2024-02-02 DIAGNOSIS — R1312 Dysphagia, oropharyngeal phase: Secondary | ICD-10-CM | POA: Diagnosis not present

## 2024-02-02 DIAGNOSIS — S82112D Displaced fracture of left tibial spine, subsequent encounter for closed fracture with routine healing: Secondary | ICD-10-CM | POA: Diagnosis not present

## 2024-02-02 DIAGNOSIS — R41841 Cognitive communication deficit: Secondary | ICD-10-CM | POA: Diagnosis not present

## 2024-02-02 NOTE — Telephone Encounter (Signed)
 Spoke with Amy (transportaiton) at [facility] ---patient is scheduled 02/11/24 at 1:00pm for the PET scan ordered by Dr. Darlean.  Arrival time is 12:30 pm-1st floor registration desk for check in---NPO 4 hours prior to study, patient can take his diabetic medication since he can eat breakfast no later than 8:00 am

## 2024-02-03 DIAGNOSIS — R41841 Cognitive communication deficit: Secondary | ICD-10-CM | POA: Diagnosis not present

## 2024-02-03 DIAGNOSIS — F411 Generalized anxiety disorder: Secondary | ICD-10-CM | POA: Diagnosis not present

## 2024-02-03 DIAGNOSIS — S82112D Displaced fracture of left tibial spine, subsequent encounter for closed fracture with routine healing: Secondary | ICD-10-CM | POA: Diagnosis not present

## 2024-02-03 DIAGNOSIS — F339 Major depressive disorder, recurrent, unspecified: Secondary | ICD-10-CM | POA: Diagnosis not present

## 2024-02-03 DIAGNOSIS — R1312 Dysphagia, oropharyngeal phase: Secondary | ICD-10-CM | POA: Diagnosis not present

## 2024-02-10 DIAGNOSIS — F339 Major depressive disorder, recurrent, unspecified: Secondary | ICD-10-CM | POA: Diagnosis not present

## 2024-02-10 DIAGNOSIS — F419 Anxiety disorder, unspecified: Secondary | ICD-10-CM | POA: Diagnosis not present

## 2024-02-11 ENCOUNTER — Encounter (HOSPITAL_COMMUNITY)
Admission: RE | Admit: 2024-02-11 | Discharge: 2024-02-11 | Disposition: A | Discharge status: Home / Self Care | Source: Ambulatory Visit | Attending: Internal Medicine | Admitting: Internal Medicine

## 2024-02-11 DIAGNOSIS — R918 Other nonspecific abnormal finding of lung field: Secondary | ICD-10-CM | POA: Insufficient documentation

## 2024-02-11 DIAGNOSIS — R911 Solitary pulmonary nodule: Secondary | ICD-10-CM | POA: Diagnosis not present

## 2024-02-11 MED ORDER — FLUDEOXYGLUCOSE F - 18 (FDG) INJECTION
7.2000 | Freq: Once | INTRAVENOUS | Status: AC | PRN
  Administered 2024-02-11: 7.2 via INTRAVENOUS

## 2024-02-17 ENCOUNTER — Telehealth: Payer: Self-pay | Admitting: Internal Medicine

## 2024-02-17 DIAGNOSIS — E1159 Type 2 diabetes mellitus with other circulatory complications: Secondary | ICD-10-CM | POA: Diagnosis not present

## 2024-02-17 DIAGNOSIS — F419 Anxiety disorder, unspecified: Secondary | ICD-10-CM | POA: Diagnosis not present

## 2024-02-17 DIAGNOSIS — B351 Tinea unguium: Secondary | ICD-10-CM | POA: Diagnosis not present

## 2024-02-17 DIAGNOSIS — F411 Generalized anxiety disorder: Secondary | ICD-10-CM | POA: Diagnosis not present

## 2024-02-17 DIAGNOSIS — F339 Major depressive disorder, recurrent, unspecified: Secondary | ICD-10-CM | POA: Diagnosis not present

## 2024-02-17 NOTE — Telephone Encounter (Signed)
 His pet scan is worrisome for a tumor and is not operable so needs appts with Dr AL to discuss when or whether to offer tissue dx (bronchosopy)  so ideally he needs to be accompanied by family member who can speak to his best interest.   I left a message with his social worker to call me to work out the ov but in meantime let's go ahead and put him on Dr Ulanda next available consult slot so we don't lose it

## 2024-02-17 NOTE — Telephone Encounter (Signed)
 Pt need appt with Dr Catherine next avail. - abn PET showing tumor

## 2024-02-18 NOTE — Telephone Encounter (Signed)
 Spoke with [facility], transportation will call back to schedule this appointment with Dr. Catherine as ordered by Dr. Darlean

## 2024-02-24 DIAGNOSIS — F419 Anxiety disorder, unspecified: Secondary | ICD-10-CM | POA: Diagnosis not present

## 2024-02-24 DIAGNOSIS — F411 Generalized anxiety disorder: Secondary | ICD-10-CM | POA: Diagnosis not present

## 2024-02-24 DIAGNOSIS — F339 Major depressive disorder, recurrent, unspecified: Secondary | ICD-10-CM | POA: Diagnosis not present

## 2024-02-26 DIAGNOSIS — L0390 Cellulitis, unspecified: Secondary | ICD-10-CM | POA: Diagnosis not present

## 2024-02-26 DIAGNOSIS — H04129 Dry eye syndrome of unspecified lacrimal gland: Secondary | ICD-10-CM | POA: Diagnosis not present

## 2024-02-26 DIAGNOSIS — I509 Heart failure, unspecified: Secondary | ICD-10-CM | POA: Diagnosis not present

## 2024-02-26 DIAGNOSIS — I10 Essential (primary) hypertension: Secondary | ICD-10-CM | POA: Diagnosis not present

## 2024-02-26 DIAGNOSIS — J449 Chronic obstructive pulmonary disease, unspecified: Secondary | ICD-10-CM | POA: Diagnosis not present

## 2024-02-26 DIAGNOSIS — H109 Unspecified conjunctivitis: Secondary | ICD-10-CM | POA: Diagnosis not present

## 2024-02-26 DIAGNOSIS — R911 Solitary pulmonary nodule: Secondary | ICD-10-CM | POA: Diagnosis not present

## 2024-02-26 DIAGNOSIS — I700 Atherosclerosis of aorta: Secondary | ICD-10-CM | POA: Diagnosis not present

## 2024-02-26 DIAGNOSIS — E785 Hyperlipidemia, unspecified: Secondary | ICD-10-CM | POA: Diagnosis not present

## 2024-02-26 DIAGNOSIS — H04309 Unspecified dacryocystitis of unspecified lacrimal passage: Secondary | ICD-10-CM | POA: Diagnosis not present

## 2024-02-26 DIAGNOSIS — E1129 Type 2 diabetes mellitus with other diabetic kidney complication: Secondary | ICD-10-CM | POA: Diagnosis not present

## 2024-02-26 DIAGNOSIS — S52592D Other fractures of lower end of left radius, subsequent encounter for closed fracture with routine healing: Secondary | ICD-10-CM | POA: Diagnosis not present

## 2024-03-01 DIAGNOSIS — E039 Hypothyroidism, unspecified: Secondary | ICD-10-CM | POA: Diagnosis not present

## 2024-03-02 DIAGNOSIS — F419 Anxiety disorder, unspecified: Secondary | ICD-10-CM | POA: Diagnosis not present

## 2024-03-02 DIAGNOSIS — F339 Major depressive disorder, recurrent, unspecified: Secondary | ICD-10-CM | POA: Diagnosis not present

## 2024-03-07 DIAGNOSIS — I509 Heart failure, unspecified: Secondary | ICD-10-CM | POA: Diagnosis not present

## 2024-03-07 DIAGNOSIS — I700 Atherosclerosis of aorta: Secondary | ICD-10-CM | POA: Diagnosis not present

## 2024-03-07 DIAGNOSIS — G8929 Other chronic pain: Secondary | ICD-10-CM | POA: Diagnosis not present

## 2024-03-07 DIAGNOSIS — I2510 Atherosclerotic heart disease of native coronary artery without angina pectoris: Secondary | ICD-10-CM | POA: Diagnosis not present

## 2024-03-07 DIAGNOSIS — R5381 Other malaise: Secondary | ICD-10-CM | POA: Diagnosis not present

## 2024-03-07 DIAGNOSIS — F411 Generalized anxiety disorder: Secondary | ICD-10-CM | POA: Diagnosis not present

## 2024-03-07 DIAGNOSIS — J449 Chronic obstructive pulmonary disease, unspecified: Secondary | ICD-10-CM | POA: Diagnosis not present

## 2024-03-07 DIAGNOSIS — K5909 Other constipation: Secondary | ICD-10-CM | POA: Diagnosis not present

## 2024-03-07 DIAGNOSIS — I10 Essential (primary) hypertension: Secondary | ICD-10-CM | POA: Diagnosis not present

## 2024-03-07 DIAGNOSIS — K219 Gastro-esophageal reflux disease without esophagitis: Secondary | ICD-10-CM | POA: Diagnosis not present

## 2024-03-09 DIAGNOSIS — Z452 Encounter for adjustment and management of vascular access device: Secondary | ICD-10-CM | POA: Diagnosis not present

## 2024-03-11 DIAGNOSIS — G8929 Other chronic pain: Secondary | ICD-10-CM | POA: Diagnosis not present

## 2024-03-11 DIAGNOSIS — I2510 Atherosclerotic heart disease of native coronary artery without angina pectoris: Secondary | ICD-10-CM | POA: Diagnosis not present

## 2024-03-11 DIAGNOSIS — I10 Essential (primary) hypertension: Secondary | ICD-10-CM | POA: Diagnosis not present

## 2024-03-11 DIAGNOSIS — I700 Atherosclerosis of aorta: Secondary | ICD-10-CM | POA: Diagnosis not present

## 2024-03-11 DIAGNOSIS — F411 Generalized anxiety disorder: Secondary | ICD-10-CM | POA: Diagnosis not present

## 2024-03-11 DIAGNOSIS — J449 Chronic obstructive pulmonary disease, unspecified: Secondary | ICD-10-CM | POA: Diagnosis not present

## 2024-03-11 DIAGNOSIS — I509 Heart failure, unspecified: Secondary | ICD-10-CM | POA: Diagnosis not present

## 2024-03-11 DIAGNOSIS — R319 Hematuria, unspecified: Secondary | ICD-10-CM | POA: Diagnosis not present

## 2024-03-11 DIAGNOSIS — K5909 Other constipation: Secondary | ICD-10-CM | POA: Diagnosis not present

## 2024-03-14 ENCOUNTER — Telehealth: Payer: Self-pay | Admitting: Pharmacist

## 2024-03-14 ENCOUNTER — Telehealth: Payer: Self-pay

## 2024-03-14 DIAGNOSIS — N281 Cyst of kidney, acquired: Secondary | ICD-10-CM | POA: Diagnosis not present

## 2024-03-14 DIAGNOSIS — J439 Emphysema, unspecified: Secondary | ICD-10-CM

## 2024-03-14 NOTE — Telephone Encounter (Addendum)
 Needs to be seen for PAP (Farxiga ) and PharmD Will route to CMA for scheduling  [phone] placed to Pharmacy  Mliss Tarry Griffin, PharmD, BCACP, CPP Clinical Pharmacist, [facility]

## 2024-03-14 NOTE — Telephone Encounter (Signed)
 He's no showed this year He will need to be seen for sure I'll route to front for scheduling

## 2024-03-14 NOTE — Telephone Encounter (Signed)
   Patient hasn't been seen in a while.   Is patient being seen for diabetes at an outside clinic?

## 2024-03-16 DIAGNOSIS — F411 Generalized anxiety disorder: Secondary | ICD-10-CM | POA: Diagnosis not present

## 2024-03-16 DIAGNOSIS — F339 Major depressive disorder, recurrent, unspecified: Secondary | ICD-10-CM | POA: Diagnosis not present

## 2024-03-22 DIAGNOSIS — M6281 Muscle weakness (generalized): Secondary | ICD-10-CM | POA: Diagnosis not present

## 2024-03-22 DIAGNOSIS — J449 Chronic obstructive pulmonary disease, unspecified: Secondary | ICD-10-CM | POA: Diagnosis not present

## 2024-03-22 DIAGNOSIS — J9611 Chronic respiratory failure with hypoxia: Secondary | ICD-10-CM | POA: Diagnosis not present

## 2024-03-22 DIAGNOSIS — Z515 Encounter for palliative care: Secondary | ICD-10-CM | POA: Diagnosis not present

## 2024-03-22 DIAGNOSIS — I5032 Chronic diastolic (congestive) heart failure: Secondary | ICD-10-CM | POA: Diagnosis not present

## 2024-03-22 DIAGNOSIS — Z7409 Other reduced mobility: Secondary | ICD-10-CM | POA: Diagnosis not present

## 2024-03-22 DIAGNOSIS — Z9981 Dependence on supplemental oxygen: Secondary | ICD-10-CM | POA: Diagnosis not present

## 2024-03-22 DIAGNOSIS — C3482 Malignant neoplasm of overlapping sites of left bronchus and lung: Secondary | ICD-10-CM | POA: Diagnosis not present

## 2024-03-23 DIAGNOSIS — F339 Major depressive disorder, recurrent, unspecified: Secondary | ICD-10-CM | POA: Diagnosis not present

## 2024-03-23 DIAGNOSIS — F411 Generalized anxiety disorder: Secondary | ICD-10-CM | POA: Diagnosis not present

## 2024-03-23 DIAGNOSIS — F419 Anxiety disorder, unspecified: Secondary | ICD-10-CM | POA: Diagnosis not present

## 2024-03-24 DIAGNOSIS — I4892 Unspecified atrial flutter: Secondary | ICD-10-CM | POA: Diagnosis not present

## 2024-03-24 DIAGNOSIS — E1129 Type 2 diabetes mellitus with other diabetic kidney complication: Secondary | ICD-10-CM | POA: Diagnosis not present

## 2024-03-24 DIAGNOSIS — I2510 Atherosclerotic heart disease of native coronary artery without angina pectoris: Secondary | ICD-10-CM | POA: Diagnosis not present

## 2024-03-25 DIAGNOSIS — L03119 Cellulitis of unspecified part of limb: Secondary | ICD-10-CM | POA: Diagnosis not present

## 2024-03-25 DIAGNOSIS — C3490 Malignant neoplasm of unspecified part of unspecified bronchus or lung: Secondary | ICD-10-CM | POA: Diagnosis not present

## 2024-03-30 DIAGNOSIS — C3490 Malignant neoplasm of unspecified part of unspecified bronchus or lung: Secondary | ICD-10-CM | POA: Diagnosis not present

## 2024-03-30 DIAGNOSIS — L03119 Cellulitis of unspecified part of limb: Secondary | ICD-10-CM | POA: Diagnosis not present

## 2024-03-30 DIAGNOSIS — H25813 Combined forms of age-related cataract, bilateral: Secondary | ICD-10-CM | POA: Diagnosis not present

## 2024-03-30 DIAGNOSIS — E119 Type 2 diabetes mellitus without complications: Secondary | ICD-10-CM | POA: Diagnosis not present

## 2024-03-31 DIAGNOSIS — D709 Neutropenia, unspecified: Secondary | ICD-10-CM | POA: Diagnosis not present

## 2024-03-31 DIAGNOSIS — Z13220 Encounter for screening for lipoid disorders: Secondary | ICD-10-CM | POA: Diagnosis not present

## 2024-03-31 DIAGNOSIS — R710 Precipitous drop in hematocrit: Secondary | ICD-10-CM | POA: Diagnosis not present

## 2024-04-01 DIAGNOSIS — R2233 Localized swelling, mass and lump, upper limb, bilateral: Secondary | ICD-10-CM | POA: Diagnosis not present

## 2024-04-02 DIAGNOSIS — R710 Precipitous drop in hematocrit: Secondary | ICD-10-CM | POA: Diagnosis not present
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
## 2024-04-04 DIAGNOSIS — C3490 Malignant neoplasm of unspecified part of unspecified bronchus or lung: Secondary | ICD-10-CM | POA: Diagnosis not present

## 2024-04-04 DIAGNOSIS — L03119 Cellulitis of unspecified part of limb: Secondary | ICD-10-CM | POA: Diagnosis not present

## 2024-04-05 ENCOUNTER — Ambulatory Visit: Admitting: Pulmonary Disease

## 2024-04-05 ENCOUNTER — Ambulatory Visit (INDEPENDENT_AMBULATORY_CARE_PROVIDER_SITE_OTHER): Admitting: Pulmonary Disease

## 2024-04-05 ENCOUNTER — Encounter: Admitting: Acute Care

## 2024-04-05 VITALS — BP 118/51 | HR 53 | Ht 69.0 in | Wt 142.0 lb

## 2024-04-05 DIAGNOSIS — F17210 Nicotine dependence, cigarettes, uncomplicated: Secondary | ICD-10-CM

## 2024-04-05 DIAGNOSIS — R590 Localized enlarged lymph nodes: Secondary | ICD-10-CM

## 2024-04-05 DIAGNOSIS — J9611 Chronic respiratory failure with hypoxia: Secondary | ICD-10-CM | POA: Diagnosis not present

## 2024-04-05 DIAGNOSIS — I509 Heart failure, unspecified: Secondary | ICD-10-CM | POA: Diagnosis not present

## 2024-04-05 DIAGNOSIS — I4891 Unspecified atrial fibrillation: Secondary | ICD-10-CM

## 2024-04-05 DIAGNOSIS — R918 Other nonspecific abnormal finding of lung field: Secondary | ICD-10-CM

## 2024-04-05 DIAGNOSIS — J449 Chronic obstructive pulmonary disease, unspecified: Secondary | ICD-10-CM | POA: Diagnosis not present

## 2024-04-05 NOTE — Patient Instructions (Signed)
  VISIT SUMMARY: Today, we discussed the evaluation of lung nodules found in your left lung. Given your history of smoking and family history of lung cancer, there is a high suspicion for lung cancer. We also reviewed your chronic conditions, including COPD, emphysema, chronic respiratory failure, congestive heart failure, and atrial fibrillation.  YOUR PLAN: SUSPECTED LUNG CANCER: You have nodules in your left lung that are highly suspicious for lung cancer based on recent scans. -We will refer you to a radiation oncologist to evaluate the possibility of radiation therapy without needing a biopsy. -We will also refer you to a medical oncologist to discuss the potential for immunotherapy without a biopsy. -You will need to see a cardiologist to get cardiac clearance before we consider any biopsy under general anesthesia. -If the oncologists require a tissue diagnosis, we may consider a biopsy through interventional radiology, but only if you are cleared by the cardiologist.  CHRONIC OBSTRUCTIVE PULMONARY DISEASE (COPD) WITH EMPHYSEMA: Your COPD and emphysema are chronic conditions that require ongoing management. -Continue with your current management plan and medications.  CHRONIC RESPIRATORY FAILURE WITH HYPOXIA: You have chronic respiratory failure that requires supplemental oxygen , especially during exertion. -Continue using your supplemental oxygen  as prescribed.  CONGESTIVE HEART FAILURE: Your congestive heart failure increases the risk of anesthesia for any invasive procedures. -You will need a cardiac evaluation before any invasive procedures.  ATRIAL FIBRILLATION: Your atrial fibrillation contributes to your overall cardiac risk and you are likely on anticoagulation therapy. -Continue with your current medications and follow up with your cardiologist as needed.  TOBACCO USE DISORDER: Your long-standing tobacco use is a major risk factor for lung cancer and COPD progression. -Consider  smoking cessation programs to help you quit smoking.                      Contains text generated by [employer].                                 Contains text generated by [employer].

## 2024-04-06 ENCOUNTER — Telehealth: Payer: Self-pay

## 2024-04-06 DIAGNOSIS — F419 Anxiety disorder, unspecified: Secondary | ICD-10-CM | POA: Diagnosis not present

## 2024-04-06 DIAGNOSIS — F339 Major depressive disorder, recurrent, unspecified: Secondary | ICD-10-CM | POA: Diagnosis not present

## 2024-04-06 NOTE — Addendum Note (Signed)
 Addended by: RUFFUS LUKES T on: 04/06/2024 09:29 AM   Modules accepted: Orders

## 2024-04-06 NOTE — Telephone Encounter (Signed)
 Dr Catherine has verbally requested I change pts cardio referral to [facility] and to call them and ask if we can get them schd possibly this week or beginning of next week. Called [facility] and they have some availability in [place] and need the facility this pt lives in to call asap to get the appointment that's up for grabs. I called facility to inform but received the voivemail of the transportation nurse, LM and stressed the importance of my phone call and left the cardio phone number for them to schedule with them asap  Sending to Alghanim for fyi

## 2024-04-07 ENCOUNTER — Telehealth: Payer: Self-pay | Admitting: Radiation Oncology

## 2024-04-07 ENCOUNTER — Other Ambulatory Visit: Payer: Self-pay

## 2024-04-07 ENCOUNTER — Telehealth: Payer: Self-pay | Admitting: Pulmonary Disease

## 2024-04-07 NOTE — Progress Notes (Signed)
 The proposed treatment discussed in conference is for discussion purpose only and is not a binding recommendation.  The patients have not been physically examined, or presented with their treatment options.  Therefore, final treatment plans cannot be decided.

## 2024-04-07 NOTE — Telephone Encounter (Signed)
 Tumor board discussion recommends radiation therapy. Can we make sure this is scheduled for him? Radiation oncology appt.

## 2024-04-07 NOTE — Telephone Encounter (Signed)
 10/30 @ 4:00 pm Left voicemail with Amy -transportation coordinator at [facility] to call our office to sch patient for consult.

## 2024-04-07 NOTE — Telephone Encounter (Signed)
 I called patient's son Ronald [name] and daughter in law Ronald [name] to discuss tumor board findings for Ronald [name]. The conclusion is that without a biopsy, the only treatment that would be offered is radiation therapy for the three lesions in his left lung. Medical oncologist would not offer immunotherapy without a biopsy. They noted understanding and wanted to proceed with that and see if it improves his lung lesions. If has has no response in the future, they may re-consider biopsy. At this time, we will make sure patient meets radiation oncology to discuss that. Routing to clinic staff to help set up appointment.

## 2024-04-11 DIAGNOSIS — R21 Rash and other nonspecific skin eruption: Secondary | ICD-10-CM | POA: Diagnosis not present

## 2024-04-11 DIAGNOSIS — F339 Major depressive disorder, recurrent, unspecified: Secondary | ICD-10-CM | POA: Diagnosis not present

## 2024-04-11 DIAGNOSIS — F411 Generalized anxiety disorder: Secondary | ICD-10-CM | POA: Diagnosis not present

## 2024-04-11 DIAGNOSIS — R600 Localized edema: Secondary | ICD-10-CM | POA: Diagnosis not present
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
## 2024-04-12 DIAGNOSIS — R600 Localized edema: Secondary | ICD-10-CM | POA: Diagnosis not present

## 2024-04-12 DIAGNOSIS — R21 Rash and other nonspecific skin eruption: Secondary | ICD-10-CM | POA: Diagnosis not present

## 2024-04-13 DIAGNOSIS — F419 Anxiety disorder, unspecified: Secondary | ICD-10-CM | POA: Diagnosis not present

## 2024-04-13 DIAGNOSIS — R7881 Bacteremia: Secondary | ICD-10-CM | POA: Diagnosis not present

## 2024-04-13 DIAGNOSIS — F339 Major depressive disorder, recurrent, unspecified: Secondary | ICD-10-CM | POA: Diagnosis not present

## 2024-04-13 NOTE — Progress Notes (Signed)
 Thoracic Location of Tumor / Histology: Left Upper / Lower Lobe Lung  Patient presented with increased shortness of breath. CT chest 01/01/2024 showed suspicious morphology in the left upper and left lower lobe with an increase in size in both since a previous CT scan in February 2023.  PET scan 02/11/2024 showed hypermetabolic left upper lobe pulmonary nodule, most consistent with primary bronchogenic carcinoma. A hypermetabolic left hilar node is consistent with metastatic disease.  Patient is here to discuss radiation options without tissue confirmation.   Biopsies of   Past/Anticipated interventions by pulmonary, if any:  Past/Anticipated interventions by cardiothoracic surgery, if any:   Past/Anticipated interventions by medical oncology, if any:    Tobacco/Marijuana/Snuff/ETOH use: Smoker  Signs/Symptoms Weight changes, if any: Stable Respiratory complaints, if any: Wears 3L oxygen  at bedtime and PRN during the day.  He reports some SOB with increased activities. Hemoptysis, if any: He reports occasional productive cough with white sputum. Pain issues, if any:  He reports some chest tightness with increased activities.  SAFETY ISSUES: Prior radiation? No Pacemaker/ICD?  No Possible current pregnancy? N/a Is the patient on methotrexate?   Current Complaints / other details:   -Lives at [facility] since February 2025.  -He reports swelling to bilateral upper extremities.  -3 stents

## 2024-04-14 ENCOUNTER — Ambulatory Visit
Admission: RE | Admit: 2024-04-14 | Discharge: 2024-04-14 | Disposition: A | Discharge status: Home / Self Care | Source: Ambulatory Visit | Attending: Radiation Oncology | Admitting: Radiation Oncology

## 2024-04-14 ENCOUNTER — Encounter: Payer: Self-pay | Admitting: Radiation Oncology

## 2024-04-14 VITALS — BP 108/56 | Temp 98.2°F | Resp 16 | Ht 69.0 in | Wt 135.0 lb

## 2024-04-14 DIAGNOSIS — K5190 Ulcerative colitis, unspecified, without complications: Secondary | ICD-10-CM | POA: Diagnosis not present

## 2024-04-14 DIAGNOSIS — E7800 Pure hypercholesterolemia, unspecified: Secondary | ICD-10-CM | POA: Insufficient documentation

## 2024-04-14 DIAGNOSIS — K219 Gastro-esophageal reflux disease without esophagitis: Secondary | ICD-10-CM | POA: Insufficient documentation

## 2024-04-14 DIAGNOSIS — Z801 Family history of malignant neoplasm of trachea, bronchus and lung: Secondary | ICD-10-CM | POA: Insufficient documentation

## 2024-04-14 DIAGNOSIS — Z79899 Other long term (current) drug therapy: Secondary | ICD-10-CM | POA: Insufficient documentation

## 2024-04-14 DIAGNOSIS — Z87891 Personal history of nicotine dependence: Secondary | ICD-10-CM | POA: Diagnosis not present

## 2024-04-14 DIAGNOSIS — Z806 Family history of leukemia: Secondary | ICD-10-CM | POA: Insufficient documentation

## 2024-04-14 DIAGNOSIS — I5032 Chronic diastolic (congestive) heart failure: Secondary | ICD-10-CM | POA: Insufficient documentation

## 2024-04-14 DIAGNOSIS — C3432 Malignant neoplasm of lower lobe, left bronchus or lung: Secondary | ICD-10-CM | POA: Insufficient documentation

## 2024-04-14 DIAGNOSIS — Z8673 Personal history of transient ischemic attack (TIA), and cerebral infarction without residual deficits: Secondary | ICD-10-CM | POA: Diagnosis not present

## 2024-04-14 DIAGNOSIS — J4489 Other specified chronic obstructive pulmonary disease: Secondary | ICD-10-CM | POA: Insufficient documentation

## 2024-04-14 DIAGNOSIS — E278 Other specified disorders of adrenal gland: Secondary | ICD-10-CM | POA: Insufficient documentation

## 2024-04-14 DIAGNOSIS — Z7984 Long term (current) use of oral hypoglycemic drugs: Secondary | ICD-10-CM | POA: Insufficient documentation

## 2024-04-14 DIAGNOSIS — Z7901 Long term (current) use of anticoagulants: Secondary | ICD-10-CM | POA: Diagnosis not present

## 2024-04-14 DIAGNOSIS — R918 Other nonspecific abnormal finding of lung field: Secondary | ICD-10-CM

## 2024-04-14 DIAGNOSIS — R7881 Bacteremia: Secondary | ICD-10-CM | POA: Diagnosis not present

## 2024-04-14 DIAGNOSIS — E119 Type 2 diabetes mellitus without complications: Secondary | ICD-10-CM | POA: Insufficient documentation

## 2024-04-14 DIAGNOSIS — I480 Paroxysmal atrial fibrillation: Secondary | ICD-10-CM | POA: Diagnosis not present

## 2024-04-14 DIAGNOSIS — I2510 Atherosclerotic heart disease of native coronary artery without angina pectoris: Secondary | ICD-10-CM | POA: Diagnosis not present

## 2024-04-14 DIAGNOSIS — I110 Hypertensive heart disease with heart failure: Secondary | ICD-10-CM | POA: Insufficient documentation

## 2024-04-14 DIAGNOSIS — I252 Old myocardial infarction: Secondary | ICD-10-CM | POA: Insufficient documentation

## 2024-04-14 DIAGNOSIS — C3412 Malignant neoplasm of upper lobe, left bronchus or lung: Secondary | ICD-10-CM | POA: Insufficient documentation

## 2024-04-14 DIAGNOSIS — G8929 Other chronic pain: Secondary | ICD-10-CM | POA: Insufficient documentation

## 2024-04-14 DIAGNOSIS — G4730 Sleep apnea, unspecified: Secondary | ICD-10-CM | POA: Insufficient documentation

## 2024-04-14 DIAGNOSIS — R21 Rash and other nonspecific skin eruption: Secondary | ICD-10-CM | POA: Insufficient documentation

## 2024-04-14 DIAGNOSIS — F17210 Nicotine dependence, cigarettes, uncomplicated: Secondary | ICD-10-CM | POA: Diagnosis not present
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
## 2024-04-18 DIAGNOSIS — J9611 Chronic respiratory failure with hypoxia: Secondary | ICD-10-CM | POA: Diagnosis not present

## 2024-04-18 DIAGNOSIS — J449 Chronic obstructive pulmonary disease, unspecified: Secondary | ICD-10-CM | POA: Diagnosis not present

## 2024-04-18 DIAGNOSIS — M6281 Muscle weakness (generalized): Secondary | ICD-10-CM | POA: Diagnosis not present

## 2024-04-18 DIAGNOSIS — Z7409 Other reduced mobility: Secondary | ICD-10-CM | POA: Diagnosis not present

## 2024-04-18 DIAGNOSIS — Z515 Encounter for palliative care: Secondary | ICD-10-CM | POA: Diagnosis not present

## 2024-04-18 DIAGNOSIS — L2989 Other pruritus: Secondary | ICD-10-CM | POA: Diagnosis not present

## 2024-04-18 DIAGNOSIS — R21 Rash and other nonspecific skin eruption: Secondary | ICD-10-CM | POA: Diagnosis not present

## 2024-04-18 DIAGNOSIS — Z9981 Dependence on supplemental oxygen: Secondary | ICD-10-CM | POA: Diagnosis not present

## 2024-04-18 DIAGNOSIS — C3490 Malignant neoplasm of unspecified part of unspecified bronchus or lung: Secondary | ICD-10-CM | POA: Diagnosis not present

## 2024-04-18 DIAGNOSIS — R600 Localized edema: Secondary | ICD-10-CM | POA: Diagnosis not present

## 2024-04-18 DIAGNOSIS — C3482 Malignant neoplasm of overlapping sites of left bronchus and lung: Secondary | ICD-10-CM | POA: Diagnosis not present

## 2024-04-18 DIAGNOSIS — C771 Secondary and unspecified malignant neoplasm of intrathoracic lymph nodes: Secondary | ICD-10-CM | POA: Diagnosis not present

## 2024-04-19 ENCOUNTER — Telehealth: Payer: Self-pay | Admitting: *Deleted

## 2024-04-19 ENCOUNTER — Ambulatory Visit: Admitting: General Practice

## 2024-04-19 DIAGNOSIS — R760 Raised antibody titer: Secondary | ICD-10-CM | POA: Diagnosis not present

## 2024-04-19 DIAGNOSIS — R946 Abnormal results of thyroid function studies: Secondary | ICD-10-CM | POA: Diagnosis not present

## 2024-04-19 DIAGNOSIS — D649 Anemia, unspecified: Secondary | ICD-10-CM | POA: Diagnosis not present

## 2024-04-19 NOTE — Telephone Encounter (Signed)
 Left a voicemail for the patient's son to clarify if the patient has a implanted cardiac device or any other implanted device.  Call back number [phone]) left for return call.

## 2024-04-19 NOTE — Addendum Note (Signed)
 Encounter addended by: Lanell [name] Stagger, PA-C on: 04/19/2024 1:57 PM  Actions taken: Clinical Note Signed, Delete clinical note
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
## 2024-04-20 DIAGNOSIS — F419 Anxiety disorder, unspecified: Secondary | ICD-10-CM | POA: Diagnosis not present

## 2024-04-20 DIAGNOSIS — C3490 Malignant neoplasm of unspecified part of unspecified bronchus or lung: Secondary | ICD-10-CM | POA: Diagnosis not present

## 2024-04-20 DIAGNOSIS — L2989 Other pruritus: Secondary | ICD-10-CM | POA: Diagnosis not present

## 2024-04-20 DIAGNOSIS — F411 Generalized anxiety disorder: Secondary | ICD-10-CM | POA: Diagnosis not present

## 2024-04-20 DIAGNOSIS — F339 Major depressive disorder, recurrent, unspecified: Secondary | ICD-10-CM | POA: Diagnosis not present

## 2024-04-25 ENCOUNTER — Other Ambulatory Visit: Payer: Self-pay

## 2024-04-25 ENCOUNTER — Inpatient Hospital Stay

## 2024-04-25 ENCOUNTER — Ambulatory Visit
Admission: RE | Admit: 2024-04-25 | Discharge: 2024-04-25 | Disposition: A | Discharge status: Home / Self Care | Source: Ambulatory Visit | Attending: Radiation Oncology | Admitting: Radiation Oncology

## 2024-04-25 ENCOUNTER — Encounter (HOSPITAL_COMMUNITY): Payer: Self-pay

## 2024-04-25 ENCOUNTER — Emergency Department (HOSPITAL_COMMUNITY)
Admission: EM | Admit: 2024-04-25 | Discharge: 2024-04-25 | Disposition: A | Discharge status: Rehabilitation Facility | Attending: Emergency Medicine | Admitting: Emergency Medicine

## 2024-04-25 ENCOUNTER — Ambulatory Visit: Admitting: Radiation Oncology

## 2024-04-25 ENCOUNTER — Emergency Department (HOSPITAL_COMMUNITY)

## 2024-04-25 ENCOUNTER — Telehealth: Payer: Self-pay | Admitting: Radiation Oncology

## 2024-04-25 ENCOUNTER — Inpatient Hospital Stay: Admitting: Oncology

## 2024-04-25 DIAGNOSIS — I4891 Unspecified atrial fibrillation: Secondary | ICD-10-CM | POA: Insufficient documentation

## 2024-04-25 DIAGNOSIS — S0083XA Contusion of other part of head, initial encounter: Secondary | ICD-10-CM | POA: Diagnosis not present

## 2024-04-25 DIAGNOSIS — J449 Chronic obstructive pulmonary disease, unspecified: Secondary | ICD-10-CM | POA: Diagnosis not present

## 2024-04-25 DIAGNOSIS — I2510 Atherosclerotic heart disease of native coronary artery without angina pectoris: Secondary | ICD-10-CM | POA: Diagnosis not present

## 2024-04-25 DIAGNOSIS — I6381 Other cerebral infarction due to occlusion or stenosis of small artery: Secondary | ICD-10-CM | POA: Diagnosis not present

## 2024-04-25 DIAGNOSIS — Z85118 Personal history of other malignant neoplasm of bronchus and lung: Secondary | ICD-10-CM | POA: Diagnosis not present

## 2024-04-25 DIAGNOSIS — S199XXA Unspecified injury of neck, initial encounter: Secondary | ICD-10-CM | POA: Diagnosis not present

## 2024-04-25 DIAGNOSIS — Z79899 Other long term (current) drug therapy: Secondary | ICD-10-CM | POA: Diagnosis not present

## 2024-04-25 DIAGNOSIS — Z7901 Long term (current) use of anticoagulants: Secondary | ICD-10-CM | POA: Diagnosis not present

## 2024-04-25 DIAGNOSIS — R21 Rash and other nonspecific skin eruption: Secondary | ICD-10-CM | POA: Diagnosis not present

## 2024-04-25 DIAGNOSIS — I700 Atherosclerosis of aorta: Secondary | ICD-10-CM | POA: Diagnosis not present

## 2024-04-25 DIAGNOSIS — W19XXXA Unspecified fall, initial encounter: Secondary | ICD-10-CM

## 2024-04-25 DIAGNOSIS — E119 Type 2 diabetes mellitus without complications: Secondary | ICD-10-CM | POA: Insufficient documentation

## 2024-04-25 DIAGNOSIS — R911 Solitary pulmonary nodule: Secondary | ICD-10-CM | POA: Diagnosis not present

## 2024-04-25 DIAGNOSIS — Z7984 Long term (current) use of oral hypoglycemic drugs: Secondary | ICD-10-CM | POA: Diagnosis not present

## 2024-04-25 DIAGNOSIS — W06XXXA Fall from bed, initial encounter: Secondary | ICD-10-CM | POA: Insufficient documentation

## 2024-04-25 DIAGNOSIS — I110 Hypertensive heart disease with heart failure: Secondary | ICD-10-CM | POA: Diagnosis not present

## 2024-04-25 DIAGNOSIS — I509 Heart failure, unspecified: Secondary | ICD-10-CM | POA: Diagnosis not present

## 2024-04-25 DIAGNOSIS — I10 Essential (primary) hypertension: Secondary | ICD-10-CM | POA: Diagnosis not present

## 2024-04-25 DIAGNOSIS — J439 Emphysema, unspecified: Secondary | ICD-10-CM | POA: Diagnosis not present

## 2024-04-25 DIAGNOSIS — S0990XA Unspecified injury of head, initial encounter: Secondary | ICD-10-CM | POA: Diagnosis not present

## 2024-04-25 LAB — CBC
HCT: 48.5 % (ref 39.0–52.0)
Hemoglobin: 15.8 g/dL (ref 13.0–17.0)
MCH: 30.7 pg (ref 26.0–34.0)
MCHC: 32.6 g/dL (ref 30.0–36.0)
MCV: 94.2 fL (ref 80.0–100.0)
Platelets: 423 K/uL — ABNORMAL HIGH (ref 150–400)
RBC: 5.15 MIL/uL (ref 4.22–5.81)
RDW: 14.9 % (ref 11.5–15.5)
WBC: 8.4 K/uL (ref 4.0–10.5)
nRBC: 0 % (ref 0.0–0.2)

## 2024-04-25 LAB — COMPREHENSIVE METABOLIC PANEL WITH GFR
ALT: 12 U/L (ref 0–44)
AST: 22 U/L (ref 15–41)
Albumin: 3.2 g/dL — ABNORMAL LOW (ref 3.5–5.0)
Alkaline Phosphatase: 94 U/L (ref 38–126)
Anion gap: 9 (ref 5–15)
BUN: 30 mg/dL — ABNORMAL HIGH (ref 8–23)
CO2: 27 mmol/L (ref 22–32)
Calcium: 8.3 mg/dL — ABNORMAL LOW (ref 8.9–10.3)
Chloride: 106 mmol/L (ref 98–111)
Creatinine, Ser: 1.37 mg/dL — ABNORMAL HIGH (ref 0.61–1.24)
GFR, Estimated: 53 mL/min — ABNORMAL LOW (ref >60)
Glucose, Bld: 97 mg/dL (ref 70–99)
Potassium: 3.9 mmol/L (ref 3.5–5.1)
Sodium: 142 mmol/L (ref 135–145)
Total Bilirubin: 0.4 mg/dL (ref 0.0–1.2)
Total Protein: 5.6 g/dL — ABNORMAL LOW (ref 6.5–8.1)

## 2024-04-25 LAB — URINALYSIS, ROUTINE W REFLEX MICROSCOPIC
Bacteria, UA: NONE SEEN
Bilirubin Urine: NEGATIVE
Glucose, UA: >=500 mg/dL — AB
Hgb urine dipstick: NEGATIVE
Ketones, ur: NEGATIVE mg/dL
Leukocytes,Ua: NEGATIVE
Nitrite: NEGATIVE
Protein, ur: NEGATIVE mg/dL
Specific Gravity, Urine: 1.022 (ref 1.005–1.030)
pH: 5 (ref 5.0–8.0)

## 2024-04-25 LAB — MAGNESIUM: Magnesium: 2.2 mg/dL (ref 1.7–2.4)

## 2024-04-25 NOTE — ED Notes (Signed)
 Nurse at [facility] updated via incoming call

## 2024-04-25 NOTE — ED Notes (Signed)
 WAS ABLE TO GIVE REPORT ONCOMING NURSE ARRIVING AT 1500 ON [address]ALL

## 2024-04-25 NOTE — ED Notes (Signed)
 Nurse at [facility] was updated at 1358. Failed attempt to call report prior to DC at this time

## 2024-04-25 NOTE — Telephone Encounter (Signed)
 Appt coordinator Amy called to advise they need to r/s appt today due to pt falling and going to ED. Call transferred to [facility] team.

## 2024-04-25 NOTE — Discharge Instructions (Signed)
 Your test results today were reassuring.  Imaging studies did not show any major injuries.  Take Tylenol  as needed for pain and soreness.  Continue your current treatment for your rash.  Return to the emergency department for any new or worsening symptoms of concern.

## 2024-04-25 NOTE — ED Provider Notes (Signed)
 [place] EMERGENCY DEPARTMENT AT [facility] Provider Note   CSN: [id number] Arrival date & time: 04/25/24  9058     Patient presents with: Ronald [name] is a 78 y.o. male.    Fall  Patient presents after fall.  Medical history includes HTN, HLD, DM, atrial fibrillation, GERD, anxiety, depression, CHF, COPD, CAD, ulcerative colitis, OSA, lung cancer.  He is prescribed Eliquis .  He is currently residing in a rehab facility.  For the past 3 weeks, he has had a pruritic rash throughout his back, arms, legs.  He states that he is ambulatory at baseline.  This morning, he had a mechanical fall.  He did strike his forehead.  He denies any new areas of pain.     Prior to Admission medications   Medication Sig Start Date End Date Taking? Authorizing Provider  albuterol  (VENTOLIN  HFA) 108 (90 Base) MCG/ACT inhaler Inhale 2 puffs into the lungs every 6 (six) hours as needed for wheezing or shortness of breath. 05/22/23   Lavell Lye A, FNP  ALPRAZolam  (XANAX ) 0.25 MG tablet Take 0.25 mg by mouth. 04/13/24   [provider]  amiodarone  (PACERONE ) 200 MG tablet Take 1 tablet (200 mg total) by mouth daily. **NEEDS TO BE SEEN BEFORE NEXT REFILL** 08/06/23   Lavell Lye A, FNP  amLODipine  (NORVASC ) 10 MG tablet Take 1 tablet (10 mg total) by mouth daily. Patient taking differently: Take 5 mg by mouth daily. 07/16/23   Gonfa, Taye T, MD  apixaban  (ELIQUIS ) 5 MG TABS tablet Take 1 tablet (5 mg total) by mouth 2 (two) times daily. 06/09/23   Evonnie Lenis, MD  atorvastatin  (LIPITOR ) 40 MG tablet Take 40 mg by mouth daily. 01/13/24   [provider]  bisoprolol  (ZEBETA ) 5 MG tablet TAKE 1 TABLET BY MOUTH ONCE DAILY IN  PLACE  OF  METOPROLOL  Patient taking differently: 5 mg. 02/24/23   Lavell Lye A, FNP  budesonide  (PULMICORT ) 0.5 MG/2ML nebulizer solution Take 0.5 mg by nebulization in the morning and at bedtime. 11/23/23   [provider]  buPROPion   (WELLBUTRIN  XL) 150 MG 24 hr tablet Take 1 tablet by mouth once daily 02/24/23   Lavell Lye A, FNP  busPIRone  (BUSPAR ) 7.5 MG tablet Take 7.5 mg by mouth 3 (three) times daily. 03/16/24   [provider]  dapagliflozin  propanediol (FARXIGA ) 10 MG TABS tablet Take 1 tablet (10 mg total) by mouth daily before breakfast. 11/10/23   Lavell Lye LABOR, FNP  ertapenem [facility]) 1 g injection 1 g. 03/29/24   [provider]  fluticasone  furoate-vilanterol (BREO ELLIPTA ) 100-25 MCG/ACT AEPB Inhale 1 puff into the lungs daily.    [provider]  folic acid  (FOLVITE ) 1 MG tablet Take 1 tablet (1 mg total) by mouth daily. 07/15/23   Gonfa, Taye T, MD  furosemide  (LASIX ) 40 MG tablet Take 1 tablet (40 mg total) by mouth daily. 08/27/23   Miriam Norris, NP  ipratropium-albuterol  (DUONEB) 0.5-2.5 (3) MG/3ML SOLN USE 1 AMPULE IN NEBULIZER EVERY 6 HOURS AS NEEDED FOR  ASTHMA 06/25/23   Lavell Lye A, FNP  isosorbide  mononitrate (IMDUR ) 60 MG 24 hr tablet Take 1 tablet (60 mg total) by mouth daily. 04/29/23   Alvan Dorn FALCON, MD  Multiple Vitamin (MULTIVITAMIN WITH MINERALS) TABS tablet Take 1 tablet by mouth daily. 07/15/23   Gonfa, Taye T, MD  MYRBETRIQ 25 MG TB24 tablet Take 25 mg by mouth daily. 04/07/24   [provider]  nitroGLYCERIN  (NITROSTAT ) 0.4 MG SL tablet Place 1 tablet (0.4 mg total) under the tongue every 5 (five) minutes x 3 doses as needed for chest pain (if no relief after 2nd dose, proceed to ED or call 911). 04/29/23   Alvan Dorn FALCON, MD  omeprazole  (PRILOSEC ) 20 MG capsule Take 1 capsule (20 mg total) by mouth daily. 05/29/23   Lavell Lye A, FNP  polyethylene glycol (MIRALAX  / GLYCOLAX ) 17 g packet Take 17 g by mouth daily. Patient taking differently: Take 17 g by mouth daily as needed for mild constipation or moderate constipation. 12/10/21   Pearlean Manus, MD  potassium chloride  SA (KLOR-CON  M) 20 MEQ tablet Take 20 mEq by mouth 2 (two) times daily.  08/05/23   [provider]  rosuvastatin  (CRESTOR ) 20 MG tablet Take 1 tablet (20 mg total) by mouth daily. 05/29/23   Lavell Lye A, FNP  thiamine  (VITAMIN B-1) 100 MG tablet Take 1 tablet (100 mg total) by mouth daily. 07/15/23   Gonfa, Taye T, MD  umeclidinium bromide  (INCRUSE ELLIPTA ) 62.5 MCG/ACT AEPB Inhale 1 puff into the lungs daily.    [provider]    Allergies: Gabapentin and Metformin  and related    Review of Systems  Skin:  Positive for rash.  All other systems reviewed and are negative.   Updated Vital Signs BP 136/60   Pulse 61   Temp 97.8 F (36.6 C) (Oral)   Resp (!) 22   Ht 5' 9 (1.753 m)   Wt 61.2 kg   SpO2 91%   BMI 19.94 kg/m   Physical Exam Vitals and nursing note reviewed.  Constitutional:      General: He is not in acute distress.    Appearance: Normal appearance. He is well-developed. He is not ill-appearing, toxic-appearing or diaphoretic.  HENT:     Head: Normocephalic.     Comments: Hematoma with overlying abrasion to right forehead.    Right Ear: External ear normal.     Left Ear: External ear normal.     Nose: Nose normal.     Mouth/Throat:     Mouth: Mucous membranes are moist.  Eyes:     Extraocular Movements: Extraocular movements intact.     Conjunctiva/sclera: Conjunctivae normal.  Cardiovascular:     Rate and Rhythm: Normal rate and regular rhythm.  Pulmonary:     Effort: Pulmonary effort is normal. No respiratory distress.  Abdominal:     General: There is no distension.     Palpations: Abdomen is soft.     Tenderness: There is no abdominal tenderness.  Musculoskeletal:        General: Normal range of motion.     Cervical back: Normal range of motion and neck supple.  Skin:    General: Skin is warm and dry.     Findings: Rash present.  Neurological:     General: No focal deficit present.     Mental Status: He is alert and oriented to person, place, and time.  Psychiatric:        Mood and Affect: Mood  normal.        Behavior: Behavior normal.        (all labs ordered are listed, but only abnormal results are displayed) Labs Reviewed  COMPREHENSIVE METABOLIC PANEL WITH GFR - Abnormal; Notable for the following components:      Result Value   BUN 30 (*)    Creatinine, Ser 1.37 (*)    Calcium  8.3 (*)  Total Protein 5.6 (*)    Albumin  3.2 (*)    GFR, Estimated 53 (*)    All other components within normal limits  CBC - Abnormal; Notable for the following components:   Platelets 423 (*)    All other components within normal limits  URINALYSIS, ROUTINE W REFLEX MICROSCOPIC - Abnormal; Notable for the following components:   Glucose, UA >=500 (*)    All other components within normal limits  MAGNESIUM     EKG: EKG Interpretation Date/Time:  Monday April 25 2024 09:51:14 EST Ventricular Rate:  61 PR Interval:    QRS Duration:  134 QT Interval:  453 QTC Calculation: 457 R Axis:   76  Text Interpretation: Sinus rhythm Left bundle branch block Artifact in lead(s) I II III aVR aVL aVF V2 V3 V4 Confirmed by Melvenia Motto [phone]) on 04/25/2024 1:41:48 PM  Radiology: CT HEAD WO CONTRAST Result Date: 04/25/2024 EXAM: CT HEAD AND CERVICAL SPINE 04/25/2024 12:00:39 PM TECHNIQUE: CT of the head and cervical spine was performed without the administration of intravenous contrast. Multiplanar reformatted images are provided for review. Automated exposure control, iterative reconstruction, and/or weight based adjustment of the mA/kV was utilized to reduce the radiation dose to as low as reasonably achievable. COMPARISON: CT head and CT cervical spine 04/11/2024 CLINICAL HISTORY: Head trauma, moderate-severe FINDINGS: CT HEAD BRAIN AND VENTRICLES: No acute intracranial hemorrhage. No mass effect or midline shift. No abnormal extra-axial fluid collection. No evidence of acute infarct. Remote left basal ganglia lacunar infarct. No hydrocephalus. ORBITS: No acute abnormality. SINUSES AND MASTOIDS:  No acute abnormality. SOFT TISSUES AND SKULL: No acute skull fracture. Right forehead contusion. CT CERVICAL SPINE BONES AND ALIGNMENT: No acute fracture or traumatic malalignment. DEGENERATIVE CHANGES: Similar moderate multilevel degenerative change including facet/uncovertebral hypertrophy with varying degrees of neural foraminal stenosis. SOFT TISSUES: No prevertebral soft tissue swelling. IMPRESSION: 1. No acute intracranial abnormality. 2. No acute fracture or traumatic malalignment of the cervical spine. Electronically signed by: Gilmore Molt MD 04/25/2024 12:09 PM EST RP Workstation: [id number]   CT CERVICAL SPINE WO CONTRAST Result Date: 04/25/2024 EXAM: CT HEAD AND CERVICAL SPINE 04/25/2024 12:00:39 PM TECHNIQUE: CT of the head and cervical spine was performed without the administration of intravenous contrast. Multiplanar reformatted images are provided for review. Automated exposure control, iterative reconstruction, and/or weight based adjustment of the mA/kV was utilized to reduce the radiation dose to as low as reasonably achievable. COMPARISON: CT head and CT cervical spine 04/11/2024 CLINICAL HISTORY: Head trauma, moderate-severe FINDINGS: CT HEAD BRAIN AND VENTRICLES: No acute intracranial hemorrhage. No mass effect or midline shift. No abnormal extra-axial fluid collection. No evidence of acute infarct. Remote left basal ganglia lacunar infarct. No hydrocephalus. ORBITS: No acute abnormality. SINUSES AND MASTOIDS: No acute abnormality. SOFT TISSUES AND SKULL: No acute skull fracture. Right forehead contusion. CT CERVICAL SPINE BONES AND ALIGNMENT: No acute fracture or traumatic malalignment. DEGENERATIVE CHANGES: Similar moderate multilevel degenerative change including facet/uncovertebral hypertrophy with varying degrees of neural foraminal stenosis. SOFT TISSUES: No prevertebral soft tissue swelling. IMPRESSION: 1. No acute intracranial abnormality. 2. No acute fracture or traumatic  malalignment of the cervical spine. Electronically signed by: Gilmore Molt MD 04/25/2024 12:09 PM EST RP Workstation: [id number]   DG Chest Port 1 View Result Date: 04/25/2024 CLINICAL DATA:  Fall.  History of lung cancer. EXAM: PORTABLE CHEST 1 VIEW COMPARISON:  PET-CT dated 02/11/2024. FINDINGS: The heart size and mediastinal contours are within normal limits. Aortic atherosclerosis. 1.6 nodular density in the mid left  lung zone, corresponding to spiculated nodular density noted on the prior PET-CT. No appreciable acute airspace consolidation. Emphysema. Similar chronic blunting of the left costophrenic angle, may be secondary to small effusion versus pleural thickening. No pneumothorax. No acute osseous abnormality. IMPRESSION: 1. No acute cardiopulmonary findings. 2. Redemonstrated 1.6 nodular density in the mid left lung zone, corresponding to spiculated nodular density noted on the prior PET-CT. 3. Emphysema. Electronically Signed   By: Harrietta Sherry M.D.   On: 04/25/2024 11:38   DG Pelvis Portable Result Date: 04/25/2024 CLINICAL DATA:  Fall.  History of lung cancer. EXAM: PORTABLE PELVIS 1-2 VIEWS COMPARISON:  PET-CT dated 02/11/2024. FINDINGS: No acute fracture or dislocation. Femoral heads are seated within the acetabula. Sacroiliac joints and pubic symphysis appear anatomically aligned. IMPRESSION: No acute osseous abnormality. Electronically Signed   By: Harrietta Sherry M.D.   On: 04/25/2024 11:32     Procedures   Medications Ordered in the ED - No data to display                                  Medical Decision Making Amount and/or Complexity of Data Reviewed Labs: ordered. Radiology: ordered.   This patient presents to the ED for concern of fall, this involves an extensive number of treatment options, and is a complaint that carries with it a high risk of complications and morbidity.  The differential diagnosis includes acute injuries   Co morbidities / Chronic  conditions that complicate the patient evaluation  HTN, HLD, DM, atrial fibrillation, GERD, anxiety, depression, CHF, COPD, CAD, ulcerative colitis, OSA, lung cancer   Additional history obtained:  Additional history obtained from EMR External records from outside source obtained and reviewed including N/A   Lab Tests:  I Ordered, and personally interpreted labs.  The pertinent results include: Normal hemoglobin, no leukocytosis, baseline creatinine, normal electrolytes   Imaging Studies ordered:  I ordered imaging studies including x-ray of chest and pelvis, CT of head and cervical spine I independently visualized and interpreted imaging which showed no acute findings I agree with the radiologist interpretation   Cardiac Monitoring: / EKG:  The patient was maintained on a cardiac monitor.  I personally viewed and interpreted the cardiac monitored which showed an underlying rhythm of: Sinus rhythm   Problem List / ED Course / Critical interventions / Medication management  Patient presenting after fall.  During his fall, he did strike his head.  He arrives in the ED with a right forehead hematoma with overlying abrasion.  There is no significant bleeding.  He denies any significant associated pain.  He is on Eliquis .  He does have a diffuse erythematous and blanchable rash that has been present for the past 3 weeks.  Rash has been pruritic.  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  He is reportedly seeing dermatology for this.  Per review of his nursing facility paperwork, he is currently on prednisone  and gets as needed Atarax  for pruritus.  Patient does feel like his rash is improving.  Patient declines any pain medication at this time.  Workup was initiated.  Lab work is reassuring with no leukocytosis.  Imaging studies did not show any acute findings.  On reassessment, patient resting comfortably.  He was discharged in stable condition.  Social Determinants of Health:  Residing in  nursing facility     Final diagnoses:  Fall, initial encounter    ED Discharge Orders  None          Melvenia Motto, MD 04/25/24 [phone]

## 2024-04-25 NOTE — ED Notes (Signed)
 Pt son came by. He wanted team to know that the redness in his arms is much worse than normal, the redness in his legs is completely new, and the AMS is also completely new. RN told him we were waiting for the workup to be complete. He asked that his wife be called by provider and updated when a diagnosis has been made. MD notified

## 2024-04-25 NOTE — ED Triage Notes (Signed)
 Patient [name] patient from [facility] for a fall from his bed and hit his head. Is on eliquis . EMS vitals 166/62, 62 pulse. Hx of lung cancer and currently on palliative with [facility] and full code. Patient is seeing a dermatology office for rash.

## 2024-04-27 DIAGNOSIS — F339 Major depressive disorder, recurrent, unspecified: Secondary | ICD-10-CM | POA: Diagnosis not present

## 2024-04-27 DIAGNOSIS — W19XXXD Unspecified fall, subsequent encounter: Secondary | ICD-10-CM | POA: Diagnosis not present

## 2024-04-27 DIAGNOSIS — S0083XD Contusion of other part of head, subsequent encounter: Secondary | ICD-10-CM | POA: Diagnosis not present

## 2024-04-27 DIAGNOSIS — F419 Anxiety disorder, unspecified: Secondary | ICD-10-CM | POA: Diagnosis not present

## 2024-05-02 DIAGNOSIS — D485 Neoplasm of uncertain behavior of skin: Secondary | ICD-10-CM | POA: Diagnosis not present

## 2024-05-02 DIAGNOSIS — L309 Dermatitis, unspecified: Secondary | ICD-10-CM | POA: Diagnosis not present

## 2024-05-02 DIAGNOSIS — L299 Pruritus, unspecified: Secondary | ICD-10-CM | POA: Diagnosis not present

## 2024-05-03 ENCOUNTER — Ambulatory Visit: Admitting: Cardiology

## 2024-05-03 NOTE — Progress Notes (Signed)
 Has armband been applied?  Yes.    Does patient have an allergy to IV contrast dye?: No.   Has patient ever received premedication for IV contrast dye?: No.   Does patient take metformin ?: No.  If patient does take metformin  when was the last dose: N/A  Date of lab work: 05/10/2024 BUN: 24 CR: 1.15 eGfr: >60  IV site: antecubital right, condition   Has IV site been added to flowsheet?  Yes.    BP (!) 135/51 (BP Location: Right Arm, Patient Position: Sitting, Cuff Size: Large)   Pulse 61   Temp 97.6 F (36.4 C)   Resp 20   Ht 5' 9 (1.753 m)   Wt 139 lb 6.4 oz (63.2 kg)   SpO2 100%   PF (!) 2 L/min   BMI 20.59 kg/m

## 2024-05-04 DIAGNOSIS — F339 Major depressive disorder, recurrent, unspecified: Secondary | ICD-10-CM | POA: Diagnosis not present

## 2024-05-04 DIAGNOSIS — F419 Anxiety disorder, unspecified: Secondary | ICD-10-CM | POA: Diagnosis not present

## 2024-05-04 DIAGNOSIS — F411 Generalized anxiety disorder: Secondary | ICD-10-CM | POA: Diagnosis not present

## 2024-05-04 DIAGNOSIS — R600 Localized edema: Secondary | ICD-10-CM | POA: Diagnosis not present

## 2024-05-04 DIAGNOSIS — L2989 Other pruritus: Secondary | ICD-10-CM | POA: Diagnosis not present

## 2024-05-04 DIAGNOSIS — R21 Rash and other nonspecific skin eruption: Secondary | ICD-10-CM | POA: Diagnosis not present

## 2024-05-10 ENCOUNTER — Ambulatory Visit: Admitting: Radiation Oncology

## 2024-05-10 ENCOUNTER — Ambulatory Visit
Admission: RE | Admit: 2024-05-10 | Discharge: 2024-05-10 | Disposition: A | Source: Ambulatory Visit | Attending: Radiation Oncology

## 2024-05-10 ENCOUNTER — Ambulatory Visit
Admission: RE | Admit: 2024-05-10 | Discharge: 2024-05-10 | Disposition: A | Source: Ambulatory Visit | Attending: Radiation Oncology | Admitting: Radiation Oncology

## 2024-05-10 VITALS — BP 135/51 | HR 61 | Temp 97.6°F | Resp 20 | Ht 69.0 in | Wt 139.4 lb

## 2024-05-10 DIAGNOSIS — C3432 Malignant neoplasm of lower lobe, left bronchus or lung: Secondary | ICD-10-CM

## 2024-05-10 DIAGNOSIS — C3412 Malignant neoplasm of upper lobe, left bronchus or lung: Secondary | ICD-10-CM | POA: Insufficient documentation

## 2024-05-10 DIAGNOSIS — F17210 Nicotine dependence, cigarettes, uncomplicated: Secondary | ICD-10-CM | POA: Diagnosis not present

## 2024-05-10 LAB — BASIC METABOLIC PANEL - CANCER CENTER ONLY
Anion gap: 8 (ref 5–15)
BUN: 24 mg/dL — ABNORMAL HIGH (ref 8–23)
CO2: 27 mmol/L (ref 22–32)
Calcium: 8.7 mg/dL — ABNORMAL LOW (ref 8.9–10.3)
Chloride: 105 mmol/L (ref 98–111)
Creatinine: 1.15 mg/dL (ref 0.61–1.24)
GFR, Estimated: >60 mL/min (ref >60)
Glucose, Bld: 97 mg/dL (ref 70–99)
Potassium: 4.6 mmol/L (ref 3.5–5.1)
Sodium: 140 mmol/L (ref 135–145)

## 2024-05-10 MED ORDER — SODIUM CHLORIDE 0.9% FLUSH
10.0000 mL | Freq: Once | INTRAVENOUS | Status: AC
  Administered 2024-05-10: 10 mL via INTRAVENOUS

## 2024-05-11 ENCOUNTER — Ambulatory Visit

## 2024-05-11 ENCOUNTER — Ambulatory Visit: Admitting: Radiation Oncology

## 2024-05-11 DIAGNOSIS — F419 Anxiety disorder, unspecified: Secondary | ICD-10-CM | POA: Diagnosis not present

## 2024-05-11 DIAGNOSIS — F339 Major depressive disorder, recurrent, unspecified: Secondary | ICD-10-CM | POA: Diagnosis not present

## 2024-05-12 ENCOUNTER — Ambulatory Visit

## 2024-05-12 ENCOUNTER — Ambulatory Visit: Admitting: Radiation Oncology

## 2024-05-13 ENCOUNTER — Ambulatory Visit: Admitting: Radiation Oncology

## 2024-05-13 ENCOUNTER — Ambulatory Visit

## 2024-05-16 ENCOUNTER — Ambulatory Visit: Admitting: Radiation Oncology

## 2024-05-16 ENCOUNTER — Ambulatory Visit

## 2024-05-16 ENCOUNTER — Ambulatory Visit: Admitting: Urology

## 2024-05-17 ENCOUNTER — Ambulatory Visit

## 2024-05-18 ENCOUNTER — Ambulatory Visit: Admitting: Radiation Oncology

## 2024-05-18 ENCOUNTER — Ambulatory Visit

## 2024-05-18 DIAGNOSIS — F411 Generalized anxiety disorder: Secondary | ICD-10-CM | POA: Diagnosis not present

## 2024-05-18 DIAGNOSIS — F339 Major depressive disorder, recurrent, unspecified: Secondary | ICD-10-CM | POA: Diagnosis not present

## 2024-05-19 ENCOUNTER — Ambulatory Visit

## 2024-05-20 ENCOUNTER — Ambulatory Visit: Admitting: Radiation Oncology

## 2024-05-20 ENCOUNTER — Ambulatory Visit

## 2024-05-22 DIAGNOSIS — C3432 Malignant neoplasm of lower lobe, left bronchus or lung: Secondary | ICD-10-CM | POA: Diagnosis not present

## 2024-05-22 DIAGNOSIS — F17210 Nicotine dependence, cigarettes, uncomplicated: Secondary | ICD-10-CM | POA: Diagnosis not present

## 2024-05-22 DIAGNOSIS — C3412 Malignant neoplasm of upper lobe, left bronchus or lung: Secondary | ICD-10-CM | POA: Diagnosis not present

## 2024-05-23 ENCOUNTER — Ambulatory Visit: Admitting: Radiation Oncology

## 2024-05-23 ENCOUNTER — Other Ambulatory Visit: Payer: Self-pay

## 2024-05-23 ENCOUNTER — Ambulatory Visit

## 2024-05-23 ENCOUNTER — Ambulatory Visit
Admission: RE | Admit: 2024-05-23 | Discharge: 2024-05-23 | Disposition: A | Source: Ambulatory Visit | Attending: Radiation Oncology | Admitting: Radiation Oncology

## 2024-05-23 DIAGNOSIS — C3412 Malignant neoplasm of upper lobe, left bronchus or lung: Secondary | ICD-10-CM | POA: Diagnosis not present

## 2024-05-23 LAB — RAD ONC ARIA SESSION SUMMARY
Course Elapsed Days: 0
Plan Fractions Treated to Date: 1
Plan Fractions Treated to Date: 1
Plan Fractions Treated to Date: 1
Plan Prescribed Dose Per Fraction: 12 Gy
Plan Prescribed Dose Per Fraction: 12 Gy
Plan Prescribed Dose Per Fraction: 6 Gy
Plan Total Fractions Prescribed: 10
Plan Total Fractions Prescribed: 5
Plan Total Fractions Prescribed: 5
Plan Total Prescribed Dose: 60 Gy
Plan Total Prescribed Dose: 60 Gy
Plan Total Prescribed Dose: 60 Gy
Reference Point Dosage Given to Date: 12 Gy
Reference Point Dosage Given to Date: 12 Gy
Reference Point Dosage Given to Date: 6 Gy
Reference Point Session Dosage Given: 12 Gy
Reference Point Session Dosage Given: 12 Gy
Reference Point Session Dosage Given: 6 Gy
Session Number: 1

## 2024-05-24 ENCOUNTER — Ambulatory Visit

## 2024-05-25 ENCOUNTER — Ambulatory Visit

## 2024-05-25 ENCOUNTER — Other Ambulatory Visit: Payer: Self-pay | Admitting: Radiation Oncology

## 2024-05-25 ENCOUNTER — Other Ambulatory Visit: Payer: Self-pay

## 2024-05-25 ENCOUNTER — Ambulatory Visit
Admission: RE | Admit: 2024-05-25 | Discharge: 2024-05-25 | Attending: Radiation Oncology | Admitting: Radiation Oncology

## 2024-05-25 DIAGNOSIS — F419 Anxiety disorder, unspecified: Secondary | ICD-10-CM | POA: Diagnosis not present

## 2024-05-25 DIAGNOSIS — C3432 Malignant neoplasm of lower lobe, left bronchus or lung: Secondary | ICD-10-CM | POA: Diagnosis not present

## 2024-05-25 DIAGNOSIS — C3412 Malignant neoplasm of upper lobe, left bronchus or lung: Secondary | ICD-10-CM | POA: Diagnosis not present

## 2024-05-25 DIAGNOSIS — F17210 Nicotine dependence, cigarettes, uncomplicated: Secondary | ICD-10-CM | POA: Diagnosis not present

## 2024-05-25 DIAGNOSIS — F339 Major depressive disorder, recurrent, unspecified: Secondary | ICD-10-CM | POA: Diagnosis not present

## 2024-05-25 DIAGNOSIS — Z510 Encounter for antineoplastic radiation therapy: Secondary | ICD-10-CM | POA: Diagnosis not present

## 2024-05-25 LAB — RAD ONC ARIA SESSION SUMMARY
Course Elapsed Days: 2
Plan Fractions Treated to Date: 2
Plan Fractions Treated to Date: 2
Plan Fractions Treated to Date: 2
Plan Prescribed Dose Per Fraction: 12 Gy
Plan Prescribed Dose Per Fraction: 12 Gy
Plan Prescribed Dose Per Fraction: 6 Gy
Plan Total Fractions Prescribed: 10
Plan Total Fractions Prescribed: 5
Plan Total Fractions Prescribed: 5
Plan Total Prescribed Dose: 60 Gy
Plan Total Prescribed Dose: 60 Gy
Plan Total Prescribed Dose: 60 Gy
Reference Point Dosage Given to Date: 12 Gy
Reference Point Dosage Given to Date: 24 Gy
Reference Point Dosage Given to Date: 24 Gy
Reference Point Session Dosage Given: 12 Gy
Reference Point Session Dosage Given: 12 Gy
Reference Point Session Dosage Given: 6 Gy
Session Number: 2

## 2024-05-25 MED ORDER — DOXYCYCLINE HYCLATE 100 MG PO TABS: 100.0000 mg | ORAL_TABLET | Freq: Two times a day (BID) | ORAL | 0 refills | Status: DC

## 2024-05-25 MED ORDER — AMOXICILLIN-POT CLAVULANATE 875-125 MG PO TABS: 1.0000 | ORAL_TABLET | Freq: Two times a day (BID) | ORAL | 0 refills | Status: DC

## 2024-05-25 NOTE — Progress Notes (Signed)
 Spoke with the patients nurse at [facility] to let her know that we would like to start the patient on antibiotics and to let her know that his blood pressure and oxygen  levels were low in our office.  Our providers encouraged the patient to be evaluated in the ER but patient refused.  Prescriptions were faxed to the facility per their request.  Detailed notes were sent with the patient for facility review.

## 2024-05-25 NOTE — Progress Notes (Signed)
 Pt seen following XRT to two left lung nodules with SBRT. PT's cone beam imaging has become increasingly concerning for a developing RLL pneumonia. Pt seen in clinic and acknowledges he's been feeling poorly in the last few days. He is coughing up phlegm and has felt sweaty and warm over that time frame intermittently. He has O2 and is supposed to use this at 3L but states he does not use this continuously. He feels very tired and worn out.   On exam   Temp 98.1 F Pulse 79 BPM BP 80/41 with rechecking opposite arms Pulse ox 86-88% on room air, goes to 90-95% on 3L O2 via [place] RR 21   In general this is a chronically ill appearing caucasian male who is tachypneic and not wearing his O2. He willingly places his O2 via nasal cannula after noting his room air PO2. He has a regular rhythm without clicks, rubs, or murmurs. Chest is CTA on the left lung fields, but has decreased breath sounds from the scapula down to the base on the right side.   Impression/Plan: Concerns for community acquired pneumonia. I spoke with the patient and his sister who accompanied him today. The patient appears to be chronically but also somewhat acutely ill. His BP is quite concerning and I advised to go to the ER for further SIRS/SEPSIS work up. He declines and prefers to go back to [facility] with plans to increase hydration and go to ED if he worsens. I let him know if his symptoms progress this could be life threatening but he still declines ED evaluation.  2.   Clinical Stage IIB, cT1cN1M0, NSCLC of the LUL and synchronous Stage IA2, cT1bN0M0, NSCLC of the LLL. He will continue XRT as previously planned.     [name] KYM Husband, PAC

## 2024-05-26 ENCOUNTER — Telehealth: Payer: Self-pay | Admitting: Radiation Oncology

## 2024-05-26 ENCOUNTER — Ambulatory Visit

## 2024-05-26 ENCOUNTER — Encounter: Payer: Self-pay | Admitting: Radiation Oncology

## 2024-05-26 DIAGNOSIS — J9621 Acute and chronic respiratory failure with hypoxia: Secondary | ICD-10-CM | POA: Diagnosis not present

## 2024-05-26 DIAGNOSIS — J9690 Respiratory failure, unspecified, unspecified whether with hypoxia or hypercapnia: Secondary | ICD-10-CM | POA: Diagnosis not present

## 2024-05-26 DIAGNOSIS — I4901 Ventricular fibrillation: Secondary | ICD-10-CM | POA: Diagnosis not present

## 2024-05-26 DIAGNOSIS — R404 Transient alteration of awareness: Secondary | ICD-10-CM | POA: Diagnosis not present

## 2024-05-26 DIAGNOSIS — Z9981 Dependence on supplemental oxygen: Secondary | ICD-10-CM | POA: Diagnosis not present

## 2024-05-26 DIAGNOSIS — R918 Other nonspecific abnormal finding of lung field: Secondary | ICD-10-CM | POA: Diagnosis not present

## 2024-05-26 DIAGNOSIS — J9600 Acute respiratory failure, unspecified whether with hypoxia or hypercapnia: Secondary | ICD-10-CM | POA: Diagnosis not present

## 2024-05-26 DIAGNOSIS — J439 Emphysema, unspecified: Secondary | ICD-10-CM | POA: Diagnosis not present

## 2024-05-26 DIAGNOSIS — J441 Chronic obstructive pulmonary disease with (acute) exacerbation: Secondary | ICD-10-CM | POA: Diagnosis not present

## 2024-05-26 DIAGNOSIS — J189 Pneumonia, unspecified organism: Secondary | ICD-10-CM | POA: Diagnosis not present

## 2024-05-26 DIAGNOSIS — I5030 Unspecified diastolic (congestive) heart failure: Secondary | ICD-10-CM | POA: Diagnosis not present

## 2024-05-26 DIAGNOSIS — R001 Bradycardia, unspecified: Secondary | ICD-10-CM | POA: Diagnosis not present

## 2024-05-26 DIAGNOSIS — J449 Chronic obstructive pulmonary disease, unspecified: Secondary | ICD-10-CM | POA: Diagnosis not present

## 2024-05-26 DIAGNOSIS — I447 Left bundle-branch block, unspecified: Secondary | ICD-10-CM | POA: Diagnosis not present

## 2024-05-26 DIAGNOSIS — I2510 Atherosclerotic heart disease of native coronary artery without angina pectoris: Secondary | ICD-10-CM | POA: Diagnosis not present

## 2024-05-26 DIAGNOSIS — R911 Solitary pulmonary nodule: Secondary | ICD-10-CM | POA: Diagnosis not present

## 2024-05-26 DIAGNOSIS — F17210 Nicotine dependence, cigarettes, uncomplicated: Secondary | ICD-10-CM | POA: Diagnosis not present

## 2024-05-26 DIAGNOSIS — R0902 Hypoxemia: Secondary | ICD-10-CM | POA: Diagnosis not present

## 2024-05-26 DIAGNOSIS — E8729 Other acidosis: Secondary | ICD-10-CM | POA: Diagnosis not present

## 2024-05-26 DIAGNOSIS — J9622 Acute and chronic respiratory failure with hypercapnia: Secondary | ICD-10-CM | POA: Diagnosis not present

## 2024-05-26 DIAGNOSIS — C3432 Malignant neoplasm of lower lobe, left bronchus or lung: Secondary | ICD-10-CM | POA: Diagnosis not present

## 2024-05-26 DIAGNOSIS — I480 Paroxysmal atrial fibrillation: Secondary | ICD-10-CM | POA: Diagnosis not present

## 2024-05-26 DIAGNOSIS — R092 Respiratory arrest: Secondary | ICD-10-CM | POA: Diagnosis not present

## 2024-05-26 DIAGNOSIS — E119 Type 2 diabetes mellitus without complications: Secondary | ICD-10-CM | POA: Diagnosis not present

## 2024-05-26 DIAGNOSIS — R7989 Other specified abnormal findings of blood chemistry: Secondary | ICD-10-CM | POA: Diagnosis not present

## 2024-05-26 DIAGNOSIS — Z1152 Encounter for screening for COVID-19: Secondary | ICD-10-CM | POA: Diagnosis not present

## 2024-05-26 DIAGNOSIS — A419 Sepsis, unspecified organism: Secondary | ICD-10-CM | POA: Diagnosis not present

## 2024-05-26 DIAGNOSIS — I959 Hypotension, unspecified: Secondary | ICD-10-CM | POA: Diagnosis not present

## 2024-05-26 DIAGNOSIS — R6520 Severe sepsis without septic shock: Secondary | ICD-10-CM | POA: Diagnosis not present

## 2024-05-26 DIAGNOSIS — R0689 Other abnormalities of breathing: Secondary | ICD-10-CM | POA: Diagnosis not present

## 2024-05-26 DIAGNOSIS — A4189 Other specified sepsis: Secondary | ICD-10-CM | POA: Diagnosis not present

## 2024-05-26 DIAGNOSIS — C3412 Malignant neoplasm of upper lobe, left bronchus or lung: Secondary | ICD-10-CM | POA: Diagnosis not present

## 2024-05-26 DIAGNOSIS — R7889 Finding of other specified substances, not normally found in blood: Secondary | ICD-10-CM | POA: Diagnosis not present

## 2024-05-26 DIAGNOSIS — R6521 Severe sepsis with septic shock: Secondary | ICD-10-CM | POA: Diagnosis not present

## 2024-05-26 DIAGNOSIS — I110 Hypertensive heart disease with heart failure: Secondary | ICD-10-CM | POA: Diagnosis not present

## 2024-05-26 DIAGNOSIS — N179 Acute kidney failure, unspecified: Secondary | ICD-10-CM | POA: Diagnosis not present

## 2024-05-26 DIAGNOSIS — I5032 Chronic diastolic (congestive) heart failure: Secondary | ICD-10-CM | POA: Diagnosis not present

## 2024-05-26 DIAGNOSIS — I468 Cardiac arrest due to other underlying condition: Secondary | ICD-10-CM | POA: Diagnosis not present

## 2024-05-26 DIAGNOSIS — F32A Depression, unspecified: Secondary | ICD-10-CM | POA: Diagnosis not present

## 2024-05-26 DIAGNOSIS — I252 Old myocardial infarction: Secondary | ICD-10-CM | POA: Diagnosis not present

## 2024-05-26 DIAGNOSIS — Z9911 Dependence on respirator [ventilator] status: Secondary | ICD-10-CM | POA: Diagnosis not present

## 2024-05-26 DIAGNOSIS — Z4682 Encounter for fitting and adjustment of non-vascular catheter: Secondary | ICD-10-CM | POA: Diagnosis not present

## 2024-05-26 DIAGNOSIS — R0602 Shortness of breath: Secondary | ICD-10-CM | POA: Diagnosis not present

## 2024-05-26 DIAGNOSIS — J440 Chronic obstructive pulmonary disease with acute lower respiratory infection: Secondary | ICD-10-CM | POA: Diagnosis not present

## 2024-05-26 DIAGNOSIS — R000 Tachycardia, unspecified: Secondary | ICD-10-CM | POA: Diagnosis not present

## 2024-05-26 DIAGNOSIS — E785 Hyperlipidemia, unspecified: Secondary | ICD-10-CM | POA: Diagnosis not present

## 2024-05-26 DIAGNOSIS — Z79899 Other long term (current) drug therapy: Secondary | ICD-10-CM | POA: Diagnosis not present

## 2024-05-26 DIAGNOSIS — I469 Cardiac arrest, cause unspecified: Secondary | ICD-10-CM | POA: Diagnosis not present

## 2024-05-26 DIAGNOSIS — J150 Pneumonia due to Klebsiella pneumoniae: Secondary | ICD-10-CM | POA: Diagnosis not present

## 2024-05-26 DIAGNOSIS — I10 Essential (primary) hypertension: Secondary | ICD-10-CM | POA: Diagnosis not present

## 2024-05-26 NOTE — Telephone Encounter (Signed)
 Received call from facility pt is residing it. They advised he would not be coming in for tx due to needing to be rushed to hospital for respiratory issues. I advised to please call with update if pt is admitted and more appts need cx. Staff verbalized understanding.

## 2024-05-26 NOTE — Telephone Encounter (Signed)
 12/18 Received call from [facility] spoke to Amy, Patient now in hospital.  Email sent to Nursing/Alison P and copied L1 machine and Support RTT, so they are aware.

## 2024-05-26 NOTE — Nursing Note (Signed)
 Famotidine  was changed from 20 mg via enteral tube bid to 10 mg via enteral tube daily based on current renal function (CrCl ~ 20.8 ml/min), per Pharmacist-Initiated Renal Dose Adjustments policy.

## 2024-05-26 NOTE — Progress Notes (Signed)
 Staff was made aware that the [facility] team called to let us  know Mr. Abel was taken via EMS after they called a code blue at the facility. Per Amy at [facility] he went into respiratory distress and CPR was started. He was taken to [facility] ED and I spoke with charge nurse Powell to tell her about his evaluation yesterday. She states he was defibrillated on the way over to the hospital, and regained circulation and is now intubated. Family is coming and ED team is still working with him. I gave Powell my number to pass along to the ED doctor if they needed more clarity from yesterday's evaluation.

## 2024-05-27 ENCOUNTER — Ambulatory Visit: Admitting: Radiation Oncology

## 2024-05-27 ENCOUNTER — Ambulatory Visit

## 2024-05-29 NOTE — Radiation Completion Notes (Addendum)
" °  Radiation Oncology         [phone] ________________________________  Name: Ronald [name] MRN: [id number]  Date of Service: 05/25/2024  DOB: [birth year]  End of Treatment Note   Diagnosis: Clinical Stage IIB, cT1cN1M0, NSCLC of the LUL and synchronous Stage IA2, cT1bN0M0, NSCLC of the LLL.   Intent: Curative     ==========DELIVERED PLANS==========  First Treatment Date: 2024-05-20 Last Treatment Date: 2024-05-25   Plan Name: Lung_LUL_SBRT Site: Lung, Left Technique: SBRT/SRT-IMRT Mode: Photon Dose Per Fraction: 12 Gy Prescribed Dose (Delivered / Prescribed): 24 Gy / 60 Gy Prescribed Fxs (Delivered / Prescribed): 2 / 5   Plan Name: Lung_LLL_SBRT Site: Lung, Left Technique: SBRT/SRT-IMRT Mode: Photon Dose Per Fraction: 12 Gy Prescribed Dose (Delivered / Prescribed): 24 Gy / 60 Gy Prescribed Fxs (Delivered / Prescribed): 2 / 5   Plan Name: Lung_L_Hilum Site: Lung, Left Technique: IMRT Mode: Photon Dose Per Fraction: 6 Gy Prescribed Dose (Delivered / Prescribed): 12 Gy / 60 Gy Prescribed Fxs (Delivered / Prescribed): 2 / 10     ==========ON TREATMENT VISIT DATES========== 2024-05-23, 2024-05-23, 2024-05-25, 2024-05-25   See weekly On Treatment Notes in Epic for details in the Media tab (listed as Progress notes on the On Treatment Visit Dates listed above).The patient tolerated radiation. At the conclusion of treatment however he developed what appeared to be a right lower lobar opacity. He was evaluated and started on antibiotics but declined evaluation in the ER which was recommneded. See additional notation on 05/25/24.       [name] KYM Husband, PAC     "

## 2024-05-30 ENCOUNTER — Ambulatory Visit

## 2024-05-31 ENCOUNTER — Ambulatory Visit

## 2024-05-31 ENCOUNTER — Ambulatory Visit: Admitting: Radiation Oncology

## 2024-06-01 ENCOUNTER — Ambulatory Visit

## 2024-06-02 ENCOUNTER — Ambulatory Visit

## 2024-06-02 ENCOUNTER — Ambulatory Visit: Admitting: Radiation Oncology

## 2024-06-03 ENCOUNTER — Ambulatory Visit

## 2024-06-06 ENCOUNTER — Ambulatory Visit

## 2024-06-06 ENCOUNTER — Ambulatory Visit: Admitting: Radiation Oncology

## 2024-06-07 ENCOUNTER — Ambulatory Visit

## 2024-06-08 ENCOUNTER — Ambulatory Visit

## 2024-06-08 ENCOUNTER — Telehealth: Payer: Self-pay | Admitting: Radiation Oncology

## 2024-06-08 NOTE — Telephone Encounter (Signed)
 12/31 Received medical record release form from [facility], copy given to Nursing, so they are aware.

## 2024-06-09 NOTE — Discharge Summary (Addendum)
 " DISCHARGE/Death SUMMARY [facility]   Discharge date:   June 20, 2024 Length of stay:    LOS: 1 day    Discharge Service:   [facility] Hospitalists Discharge Attending Physician: Margart Elsie Dragon, DO Discharge to:    Deceased Condition at Discharge:  Deceased Code status:                         Full Code   Hospital Course: Ronald [name] was a 79 year old male with a history of COPD, heart failure with preserved ejection fraction (HFpEF), coronary artery disease (CAD) with prior stenting, paroxysmal atrial fibrillation, hypertension, type 2 diabetes mellitus, and lung cancer, who was admitted after a cardiac arrest at a rehabilitation facility, found to be in respiratory distress and sepsis. He was intubated, required vasopressor support, and ultimately expired during this hospitalization.  [facility] Course  **1. Sepsis with Organ Dysfunction (including Pneumonia, Shock, and AKI)** He was transferred from a rehabilitation facility after being found unresponsive and in respiratory distress, with a code blue called and CPR initiated at the facility. EMS found him in ventricular fibrillation, defibrillated him with return of spontaneous circulation, and he was intubated on arrival to the ED. He was hypotensive, requiring vasopressor support with norepinephrine  and vasopressin. Workup revealed leukocytosis (WBC 25.2), elevated lactate (initially 7.0, then 3.1), and acute kidney injury (creatinine 2.67, baseline 1.2-1.5). Chest imaging showed right lung opacities consistent with pneumonia. He received broad-spectrum antibiotics (cefepime , vancomycin ) and fluid resuscitation. Blood cultures grew gram-positive cocci in clusters. He remained intubated and sedated, with persistent shock and multi-organ dysfunction. Despite aggressive management, he developed bradycardia and asystole, and after no cardiac activity on bedside ultrasound and discussion with family,  resuscitation efforts were stopped and he expired.  **2. Acute on Chronic Respiratory Failure with Hypoxia and Hypercapnia (COPD Exacerbation, Emphysema, and Mechanically Assisted Ventilation)** He had a history of COPD with chronic hypoxic respiratory failure and was on home oxygen . He was found with his oxygen  off at the facility, with O2 saturations in the 50s. On arrival, he was in severe respiratory distress, requiring intubation and mechanical ventilation. ABG showed respiratory acidosis (pH 7.1, pCO2 64). He remained ventilator-dependent throughout the hospitalization, with ongoing hypoxemia and hypercapnia despite ventilator adjustments. He was treated with nebulized bronchodilators and corticosteroids. His respiratory failure was complicated by pneumonia and sepsis in the setting of underlying malignancy.  **3. Cardiac Arrest, Elevated Troponin, and Underlying CAD** He suffered a ventricular fibrillation arrest in the field, was defibrillated, and had return of spontaneous circulation. Troponin levels were markedly elevated ([phone] ## [phone]), likely multifactorial from cardiac arrest, shock, and underlying CAD. EKG showed sinus tachycardia with left bundle branch block. He had a history of CAD with prior drug-eluting stent to the LAD, and recent cardiac catheterization showed modest two-vessel disease without a culprit lesion. He was started on aspirin  loading dose, heparin  infusion, and amiodarone  drip. Cardiology was consulted and helped to provide recommendations. He had remained in normal sinus rhythm with persistent/chronic LBBB on tele. At around 11:09 am on 06-20-24 he became bradycardic, hypoxic and rhythm progressed to asystole.   **4. Heart Failure with Preserved Ejection Fraction (HFpEF)** He had a history of HFpEF (EF 55-60% on recent echocardiogram), with mildly elevated pulmonary artery systolic pressure. He was on chronic diuretic therapy. During this admission, his heart failure was  complicated by sepsis, shock, and respiratory failure, but there was no evidence of acute decompensated heart failure  on imaging or exam.  **5. Acute Kidney Injury (AKI)** He developed AKI with creatinine rising to 2.67 from a baseline of 1.2-1.5, attributed to sepsis and shock. Renal function continued to decline through hospital stay likely as a result of underlying sepsis and poor perfusion.  **6. Paroxysmal Atrial Fibrillation** He had a history of paroxysmal atrial fibrillation, previously on apixaban  and amiodarone . Amiodarone  infusion was started during this admission for arrhythmia prophylaxis. Anticoagulation was transitioned to heparin  infusion for NSTEMI and arrhythmia management.  **7. Essential Hypertension** He had a history of hypertension, but was persistently hypotensive during this admission, requiring vasopressor support. Home antihypertensive therapy was held.  **8. Gastroesophageal Reflux Disease (GERD)** He had a history of GERD, managed with famotidine  during admission. There were no acute GI complications.  **9. Lung Cancer/chronic oxygen  use** He had known non-small cell lung cancer with left upper and lower lobe nodules, under active management with radiation oncology. This was not the primary driver of his acute illness but contributed to his overall frailty and respiratory status.  **10. Age related debility** Resident at local nursing facility due to mobility and self care deficits.  **Summary** This hospitalization was characterized by refractory septic shock secondary to pneumonia, complicated by cardiac arrest, multi-organ failure, and death. His course was further complicated by underlying severe cardiopulmonary disease and malignancy.    2024-06-05 - CODE BLUE with bradycardia followed by asystole (please see CODE note). After three rounds of epi and chest compressions still no ROSC, no cardiac movement on US , son requested we stop  resuscitation. ______________________________________  Time of death 1115/06/06. Pronounced deceased by myself, nursing staff and supervisor present, as well as family at bedside. No heart tones or breath sounds, no spontaneous chest rise or fall, no spontaneous movements, pupils mid fixed and dilated. ______________________________________  Mental Status On day of Discharge:  N/A  CODE STATUS :                    Full Code   An advanced care planning discussion was  had with patient and/or patient's decisions maker (documented separately).    _____________________________________  Nutrition:                                                 ___________________________________________  Discharge Instructions   Nutrition:                                   Activity:                                    Appointments:                          Follow Up:                                  Allergies  Allergen Reactions   Gabapentin Anxiety and Other (See Comments)    Unknown reaction   Metformin  Rash     Past Medical History[1]  Past Surgical History[2]   Family History[3]   Current Medications[4]  Imaging  Echocardiogram W Colorflow Spectral  Doppler Result Date: 26-Jun-2024 Patient Info Name:     Abdurrahman Petersheim Age:     78 years DOB:     November 30, 1945 Gender:     Male MRN:     [id number] Accession #:     [id number] [facility] Account #:     [id number] Ht:     175 cm Wt:     63 kg BSA:     1.74 m2 BP:     111 /     34 mmHg HR:     70 bpm Exam Date:     2024/06/26 9:50 AM Admit Date:     05/26/2024 Exam Type:     ECHOCARDIOGRAM W COLORFLOW SPECTRAL DOPPLER Technical Quality:     Poor Staff Sonographer:     Mliss Gate Supervising Physician:     Earla Maude Currier MD Ordering Physician:     Margart Elsie Dragon Study Info Indications      - Possible V. fib Procedure(s)   Complete two-dimensional, color flow and Doppler transthoracic echocardiogram is performed. Summary   1.  Technically difficult study.   2. The left ventricle is normal in size with normal wall thickness.   3. The left ventricular systolic function is mildly decreased, LVEF is visually estimated at 40-45%.   4. There is grade I diastolic dysfunction (impaired relaxation).   5. The right ventricle is normal in size, with normal systolic function.   6. IVC size and inspiratory change suggest elevated right atrial pressure. (10-20 mmHg). Left Ventricle   The left ventricle is normal in size with normal wall thickness. The left ventricular systolic function is mildly decreased, LVEF is visually estimated at 40-45%. There is grade I diastolic dysfunction (impaired relaxation). Right Ventricle   The right ventricle is normal in size, with normal systolic function. Left Atrium   The left atrium is normal in size. Right Atrium   The right atrium is normal in size. Aortic Valve   The aortic valve is poorly visualized with probably limited excursion with moderately thickened leaflets. There is no significant aortic regurgitation. There is no evidence of a significant transvalvular gradient. Mitral Valve   The mitral valve leaflets are mildly thickened with mildly reduced leaflet mobility. There is trivial mitral valve regurgitation. Tricuspid Valve   The tricuspid valve leaflets are normal, with normal leaflet mobility. There is no significant tricuspid regurgitation. Pulmonic Valve   Pulmonary valve is not well visualized. There is no significant pulmonic regurgitation. Aorta   The aorta is normal in size in the visualized segments. Inferior Vena Cava   IVC size and inspiratory change suggest elevated right atrial pressure. (10-20 mmHg). Pericardium/Pleural   There is no pericardial effusion. Ventricles ---------------------------------------------------------------------- Name                                 Value        Normal ---------------------------------------------------------------------- LV Dimensions 2D/MM  ----------------------------------------------------------------------  IVS Diastolic Thickness (2D)                                0.9 cm       0.6-1.0 LVID Diastole (2D)                  5.5 cm       4.2-5.8  LVPW Diastolic Thickness (2D)  1.0 cm       0.6-1.0 LVID Systole (2D)                   4.2 cm       2.5-4.0 LVOT Diameter                       2.1 cm               LV Mass Index (2D Cubed)          114 g/m2        49-115  Relative Wall Thickness (2D)                                  0.36        <=0.42 LV Function ---------------------------------------------------------------------- LV EF (4C MOD)                        47 %                LV Diastolic Volume Index (BP MOD)                        59.6 ml/m2     34.0-74.0 LV EF (BP MOD)                        42 %         52-72 RV Dimensions 2D/MM ----------------------------------------------------------------------  RV Basal Diastolic Dimension                           3.2 cm       2.5-4.1 TAPSE                               1.7 cm         >=1.7 Atria ---------------------------------------------------------------------- Name                                 Value        Normal ---------------------------------------------------------------------- LA Dimensions ---------------------------------------------------------------------- LA Dimension (2D)                   3.0 cm       3.0-4.1 LA Volume Index (4C A-L)        17.04 ml/m2               LA Volume Index (2C A-L)        28.14 ml/m2               LA Volume (BP MOD)                   37 ml               LA Volume Index (BP MOD)        21.28 ml/m2   16.00-34.00 RA Dimensions ---------------------------------------------------------------------- RA Area (4C)                      12.2 cm2        <=18.0 RA Area (4C) Index  7.0 cm2/m2               RA ESV Index (4C MOD)             17 ml/m2         18-32 Left Ventricular Outflow Tract  ---------------------------------------------------------------------- Name                                 Value        Normal ---------------------------------------------------------------------- LVOT 2D ---------------------------------------------------------------------- LVOT Diameter                       2.1 cm               LVOT Area                          3.5 cm2               LVOT Doppler ---------------------------------------------------------------------- LVOT Peak Velocity                 0.9 m/s               LVOT VTI                             12 cm               LVOT Stroke Volume                   43 ml               LVOT SI                           25 ml/m2 Aortic Valve ---------------------------------------------------------------------- Name                                 Value        Normal ---------------------------------------------------------------------- AV Doppler ---------------------------------------------------------------------- AV Peak Velocity                   1.2 m/s               AV Peak Gradient                    5 mmHg               AV Mean Gradient                    3 mmHg               AV VTI                               14 cm               AV Area (Cont Eq VTI)              3.1 cm2         >=3.0 AV Area Index (Cont Eq VTI)     1.8 cm2/m2               AV Area (Cont Eq Vel)  2.6 cm2               AV Area Index (Cont Eq Vel)     1.5 cm2/m2               AV DI (Vel)                           0.75               AV DI (VTI)                           0.90 Mitral Valve ---------------------------------------------------------------------- Name                                 Value        Normal ---------------------------------------------------------------------- MV Diastolic Function ---------------------------------------------------------------------- MV E Peak Velocity                 67 cm/s               MV A Peak Velocity                 98 cm/s                MV E/A                                 0.7               MV Annular TDI ---------------------------------------------------------------------- MV Septal e' Velocity             3.9 cm/s         >=8.0 MV E/e' (Septal)                      17.1               MV Lateral e' Velocity            3.8 cm/s        >=10.0 MV E/e' (Lateral)                     17.6               MV e' Average                     3.9 cm/s               MV E/e' (Average)                     17.4 Tricuspid Valve ---------------------------------------------------------------------- Name                                 Value        Normal ---------------------------------------------------------------------- TV Regurgitation Doppler ---------------------------------------------------------------------- TR Peak Velocity                   2.2 m/s               Estimated PAP/RSVP ---------------------------------------------------------------------- RA Pressure                        15  mmHg           <=5 RV Systolic Pressure               35 mmHg           <36 Aorta ---------------------------------------------------------------------- Name                                 Value        Normal ---------------------------------------------------------------------- Ascending Aorta ---------------------------------------------------------------------- Ao Root Diameter (2D)               3.0 cm               Ao Root Diam Index (2D)          1.7 cm/m2               Ascending Aorta Diameter            3.3 cm Report Signatures Finalized by Earla Maude Currier  MD on June 17, 2024 12:51 PM  XR Chest 1 view Result Date: 2024/06/17 Exam:  Chest Single Frontal View  History:  Ventilator check, shortness of breath  Technique:  Single frontal view.  Comparison:  May 26, 2024  Findings:   The endotracheal tube terminates 2 cm above the carina. NG tube terminates below the left diaphragm. Persistent homogeneous opacity throughout the right hemithorax,  increasing comparison to prior. This may represent infiltrate with associated pleural fluid. The left lung demonstrates an 18 mm nodular focus in the lateral mid lung, unchanged and indeterminate. Heart size and mediastinal contours are normal. The bones are unchanged.    1.    Tubes and support apparatus in appropriate position. 2.    Increased opacity throughout the right hemithorax, likely pleural fluid and compressive atelectasis. Underlying infiltrate is not excluded. 3.    18 mm nodular focus in the lateral left midlung, unchanged.  Signed (Electronic Signature): 17-Jun-2024 8:15 AM Signed By: Deward DELENA Brock, MD  XR Abdomen 1 View Result Date: 05/26/2024 Exam:  Abdomen 1 view  History:  NG tube assessment  Technique:  Abdomen, 1 view (supine)  Comparison:  CT the abdomen and pelvis June 22, 2023  Findings:  There is an NG tube terminating in the left upper quadrant, likely within the cardia of the stomach with the side-port at the GE junction. This could be advanced if clinically warranted. Nonobstructive bowel gas pattern. Surgical clips in the right upper quadrant, likely prior cholecystectomy. Vascular embolization coils in the epigastric midline. Interstitial prominence throughout the lungs. Vascular calcifications are noted. There is a right groin central venous catheter terminating over the right sacrum.    1.    NG tube terminates in the stomach with the side-port at the GE junction. 2.    Nonobstructive bowel gas pattern.  Signed (Electronic Signature): 05/26/2024 4:34 PM Signed By: Deward DELENA Brock, MD  ECG 12 Lead Result Date: 05/26/2024 Sinus tachycardia Left bundle branch block Abnormal ECG When compared with ECG of 28-Jul-2023 11:26, QRS duration has decreased QT has shortened Confirmed by Cherie Searle ([id number]) on 05/26/2024 9:16:25 AM  XR Chest Portable Result Date: 05/26/2024 Exam:  Portable Chest  History:  Endotracheal tube adjustment.  Technique:  Single frontal view.   Comparison:  05/26/2024 5020 2:00 AM  Findings:   Endotracheal tube now terminates 2.5 cm from the carina. Endotracheal tube terminates in the region of the stomach with the last sidehole port at  the gastroesophageal junction. This should be advanced by 5 cm. Interval resolution of the skinfold overlying the left lung apex without evidence of a pneumothorax. Persistent airspace opacities in the right lung. 1.8 cm nodule in the left lung underlying the defibrillator pad.     1.    Endotracheal tube now terminates 2.5 cm from the carina.  2.    Enteric tube should be advanced by 5 cm.  3.    Persistent airspace opacities in the right lung.  4.    1.8 cm nodule in the left lung. Dedicated CT of the chest is recommended.  Signed (Electronic Signature): 05/26/2024 8:50 AM Signed By: Toribio Salt, MD  XR Chest Portable Result Date: 05/26/2024 Exam:  Portable Chest  History:  Intubation, respiratory failure  Technique:  Single frontal view.  Comparison:  07/30/2023  Findings:   Endotracheal tube is at the opening of the right mainstem bronchus and should be retracted by 1 to 2 cm. Cardiac silhouette is normal in size. Diffuse groundglass opacification in the right lung with superimposed right perihilar opacity which could represent infection, aspiration, or asymmetric pulmonary edema.  Trace left pleural effusion. In the left lung, there appears to be a linear density which may represent a skin fold, however a small left apical pneumothorax cannot be excluded.     1.    Endotracheal tube at the opening of the right mainstem bronchus and should be retracted by 1 to 2 cm.  2.    Diffuse groundglass opacification in the right lung with superimposed right perihilar opacity which could represent infection, aspiration, or asymmetric pulmonary edema.  3.    Linear density in the left lung apex which may represent a skin fold, however a small left apical pneumothorax cannot be excluded. Recommend repeat chest radiograph.   Signed (Electronic Signature): 05/26/2024 8:36 AM Signed By: Toribio Salt, MD   Lab Results   Recent Labs    June 24, 2024 0434  WBC 19.0*  HGB 14.1  HCT 43.6  PLT 349   Recent Labs    05/26/24 1333 06-24-24 0434  NA  --  137  K  --  5.1*  CL  --  104  CO2  --  19.2*  BUN  --  70*  CREATININE  --  3.52*  GLU  --  140  CALCIUM   --  8.5  ALBUMIN   --  1.4*  PROT  --  5.8*  BILITOT  --  1.0  AST  --  170*  ALT  --  119*  ALKPHOS  --  65  MG 2.3 2.1  PHOS  --  6.8*  LACTATE 3.1*  --    Recent Labs    05/26/24 0825 05/26/24 1134 June 24, 2024 0817  TROPONINI 123*   < > 3,698*  INR 1.91  --   --   APTT 31.5   < > 50.7*   < > = values in this interval not displayed.   Recent Labs    05/26/24 1157  WBCUA 0  NITRITE Negative  LEUKOCYTESUR Negative  BACTERIA Rare*  RBCUA 17*  BLOODU Small*  GLUCOSEU 1000 mg/dL*  PROTEINUA 50 mg/dL*  KETONESU Negative   No results for input(s): OPIAU, BENZU, TRICYCLIC, PCPU, AMPHU, COCAU, CANNAU, BARBU, ETOH, ACETAMIN, SALICYLATE in the last 72 hours. No results for input(s): PREGTESTUR, PREGPOC in the last 72 hours. No results for input(s): OCCULTBLD, RAPSCRN, CDIFRPCR, CDIFFNAP1, A1C, CHOL, LDL, HDL, TRIG in the last 72 hours. Recent Labs  2024/05/28 0957  PHART 7.03*  PCO2ART 53.0*  PO2ART 49*  HCO3ART 14.1*  O2SATART 75.7*  BEART -16.7*   Pending Labs     Order Current Status   PT-INR Collected (28-May-2024 0434)   Lower Respiratory Culture In process   Procalcitonin In process   Urine Culture In process   Blood Culture Preliminary result   Blood Culture Preliminary result       Home Medications   Prior to Admission medications  Medication Dose, Route, Frequency  acetaminophen  (TYLENOL ) 500 MG tablet 1,000 mg, Every 8 hours  albuterol  (ACCUNEB ) 1.25 mg/3 mL nebulizer solution 1 ampule, Every 6 hours PRN  albuterol  HFA 90 mcg/actuation inhaler INHALE 2 PUFFS BY MOUTH EVERY  6 HOURS AS NEEDED FOR WHEEZING FOR SHORTNESS OF BREATH  ALPRAZolam  (XANAX ) 0.25 MG tablet   amiodarone  (PACERONE ) 200 MG tablet 200 mg, Oral, Daily (standard)  amlodipine  (NORVASC ) 5 MG tablet 5 mg, Daily (standard)  aspirin  (ECOTRIN) 81 MG tablet 81 mg, Daily (standard)  atorvastatin  (LIPITOR ) 40 MG tablet 40 mg  bisoprolol  (ZEBETA ) 5 MG tablet TAKE 1 TABLET BY MOUTH ONCE DAILY IN PLACE OF METOPROLOL   budesonide  (PULMICORT ) 0.5 mg/2 mL nebulizer solution 0.5 mg, 2 times a day (standard)  budesonide -formoterol  (SYMBICORT ) 160-4.5 mcg/actuation inhaler 2 puffs, 2 times a day (standard)  buPROPion  (WELLBUTRIN  XL) 150 MG 24 hr tablet 150 mg, Daily (standard)  busPIRone  (BUSPAR ) 7.5 MG tablet 7.5 mg, 3 times a day (standard)  cholecalciferol , vitamin D3-125 mcg, 5,000 unit,, 125 mcg (5,000 unit) tablet 5,000 Units  ferrous sulfate 325 (65 FE) MG tablet 325 mg, Daily  fluticasone  propionate (FLONASE ) 50 mcg/actuation nasal spray 2 sprays, Daily PRN  fluticasone -umeclidin-vilanter (TRELEGY ELLIPTA ) 200-62.5-25 mcg DsDv 1 puff, 2 times a day (standard)  folic acid  (FOLVITE ) 400 MCG tablet 400 mcg, Daily (standard)  furosemide  (LASIX ) 20 MG tablet 20 mg, Daily (standard)  insulin  ASPART (NOVOLOG ) 100 unit/mL injection 3 times a day [facility]) Patient not taking: Reported on 12/25/2023  insulin  glargine (LANTUS ) 100 unit/mL injection 15 Units, 2 times a day  isosorbide  mononitrate (IMDUR ) 30 MG 24 hr tablet 60 mg, Daily before breakfast  JARDIANCE  25 mg tablet TAKE 1 TABLET BY MOUTH ONCE DAILY BEFORE BREAKFAST Patient not taking: Reported on 12/25/2023  KLOR-CON  M20 20 mEq ER tablet 1 tablet, 2 times a day Patient not taking: Reported on 12/25/2023  lancets (ONETOUCH ULTRASOFT) Misc Use as instructed   DX E11.9  multivitamin with minerals tablet 1 tablet, Daily (standard)  MYRBETRIQ 25 mg Tb24 extended-release tablet   nicotine  (NICODERM CQ ) 14 mg/24 hr patch 1 patch, Every 24 hours Patient not taking:  Reported on 12/25/2023  nitroglycerin  (NITROSTAT ) 0.4 MG SL tablet 1 tablet, Daily PRN  omeprazole  (PRILOSEC ) 20 MG capsule 20 mg, Daily (standard)  OXYGEN -AIR DELIVERY SYSTEMS MISC 3 L, Nightly PRN  polyethylene glycol (MIRALAX ) 17 gram packet 17 g, Daily (standard)  rosuvastatin  (CRESTOR ) 20 MG tablet 20 mg, Daily (standard)  thiamine  (VITAMIN B-1) 50 MG tablet 100 mg, Daily (standard)  triamcinolone  (KENALOG ) 0.5 % ointment 1 Application, 2 times a day Patient not taking: Reported on 12/25/2023  umeclidinium-vilanterol (ANORO ELLIPTA) 62.5-25 mcg/actuation inhaler 1 puff, Daily (standard)   Margart Dragon, DO Hospitalist, [facility] 2024/05/28, 1:21 PM       [1] Past Medical History: Diagnosis Date   Anxiety    COPD (chronic obstructive pulmonary disease) (CMS-HCC)    Depression    Hyperlipidemia    Hypertension   [2] Past Surgical History:  Procedure Laterality Date   IR INSERT G-TUBE PERCUTANEOUS  06/04/2021   IR INSERT G-TUBE PERCUTANEOUS 06/04/2021  [3] No family history on file. [4]  Current Facility-Administered Medications:    acetaminophen  (TYLENOL ) tablet 650 mg, 650 mg, Oral, Q4H PRN, Vendura, Khoury William, DO, 650 mg at 05-28-2024 0423   [EXPIRED] amiodarone  450 mg in dextrose  5% water  (NON-PVC) 250 mL (1.8 mg/mL), 1 mg/min, Intravenous, Continuous, Stopped at 05/26/24 2300 **FOLLOWED BY** amiodarone  450 mg in dextrose  5% water  (NON-PVC) 250 mL (1.8 mg/mL), 0.5 mg/min, Intravenous, Continuous, Richarda Prentice Ruth, FNP, Stopped at 28-May-2024 1126   cefepime  (MAXIPIME ) 1 g in sodium chloride  0.9 % (NS) 100 mL IVPB-connector bag, 1 g, Intravenous, Q12H, Feliz Margart Fallow, DO, Stopped at 05/28/2024 1127   famotidine  (PEPCID ) tablet 10 mg, 10 mg, Enteral tube: gastric, Daily, Pace, Hagan Eggleston, FNP, 10 mg at May 28, 2024 0854   fentaNYL  (PF) (SUBLIMAZE ) injection 25 mcg, 25 mcg, Intravenous, Q30 Min PRN **OR** fentaNYL  (PF) (SUBLIMAZE ) injection 50 mcg, 50 mcg,  Intravenous, Q30 Min PRN, Feliz Margart Fallow, DO   fentanyl  (PF) 2,500 mcg/50 mL Infusion (Inj), 0-200 mcg/hr, Intravenous, Continuous, Feliz Margart Fallow, DO, Stopped at 2024/05/28 1125   heparin  25,000 Units/250 mL (100 units/mL) in 0.45% saline infusion (premade), 21 Units/kg/hr, Intravenous, Continuous, Feliz Margart Fallow, DO, Stopped at 28-May-2024 1126   ipratropium-albuterol  (DUO-NEB) 0.5-2.5 mg/3 mL nebulizer solution 3 mL, 3 mL, Nebulization, Q6H (RT), Feliz Margart Fallow, DO, 3 mL at 2024-05-28 0904   melatonin tablet 3 mg, 3 mg, Oral, QPM, Feliz Margart Fallow, DO, 3 mg at 05/26/24 1746   midazolam  in sodium chloride  0.9% (1 mg/mL) infusion PMB, 0-6 mg/hr, Intravenous, Continuous, Feliz Margart Fallow, DO, Stopped at 2024-05-28 1125   NORepinephrine  8 mg in dextrose  5 % 250 mL (32 mcg/mL) infusion PMB, 0-30 mcg/min, Intravenous, Continuous, Feliz Margart Fallow, DO, Stopped at 2024-05-28 1125   ondansetron  (ZOFRAN ) injection 4 mg, 4 mg, Intravenous, Q8H PRN **OR** ondansetron  (ZOFRAN ) injection 8 mg, 8 mg, Intravenous, Q8H PRN, Feliz Margart Fallow, DO   propofol  (DIPRIVAN ) infusion 10 mg/mL, 0-50 mcg/kg/min, Intravenous, Continuous, Feliz Margart Fallow, DO, Last Rate: 3.7 mL/hr at 28-May-2024 0715, 10 mcg/kg/min at 28-May-2024 0715   sodium bicarbonate  150 mEq in dextrose  5 % 1,150 mL infusion, 100 mL/hr, Intravenous, Continuous, Vendura, Margart Fallow, DO   vancomycin  (VANCOCIN ) 750 mg in sodium chloride  0.9 % IVPB (premix), 750 mg, Intravenous, Q36H, Feliz Margart Fallow, DO   vasopressin 20 units in 100 mL (0.2 units/mL) infusion premade vial, 0.03 Units/min, Intravenous, Continuous, Feliz Margart Fallow, DO, Stopped at 05/28/2024 1125 "

## 2024-06-09 DEATH — deceased

## 2024-06-10 ENCOUNTER — Ambulatory Visit

## 2024-06-15 ENCOUNTER — Ambulatory Visit: Admitting: Pulmonary Disease

## 2024-07-18 ENCOUNTER — Ambulatory Visit: Admitting: Urology
